# Patient Record
Sex: Female | Born: 1935 | Race: Black or African American | Hispanic: No | Marital: Married | Smoking: Former smoker
Health system: Southern US, Community
[De-identification: ages and names within clinical notes are randomized; demographics above are authoritative.]

## PROBLEM LIST (undated history)

## (undated) DIAGNOSIS — I890 Lymphedema, not elsewhere classified: Secondary | ICD-10-CM

## (undated) DIAGNOSIS — I10 Essential (primary) hypertension: Secondary | ICD-10-CM

## (undated) DIAGNOSIS — M1990 Unspecified osteoarthritis, unspecified site: Secondary | ICD-10-CM

## (undated) DIAGNOSIS — Z923 Personal history of irradiation: Secondary | ICD-10-CM

## (undated) DIAGNOSIS — E119 Type 2 diabetes mellitus without complications: Secondary | ICD-10-CM

## (undated) DIAGNOSIS — C801 Malignant (primary) neoplasm, unspecified: Secondary | ICD-10-CM

## (undated) DIAGNOSIS — E7800 Pure hypercholesterolemia, unspecified: Secondary | ICD-10-CM

## (undated) DIAGNOSIS — I2699 Other pulmonary embolism without acute cor pulmonale: Secondary | ICD-10-CM

## (undated) DIAGNOSIS — C779 Secondary and unspecified malignant neoplasm of lymph node, unspecified: Secondary | ICD-10-CM

## (undated) DIAGNOSIS — C50919 Malignant neoplasm of unspecified site of unspecified female breast: Secondary | ICD-10-CM

## (undated) HISTORY — PX: APPENDECTOMY: SHX54

## (undated) HISTORY — PX: ABDOMINAL HYSTERECTOMY: SHX81

## (undated) HISTORY — DX: Type 2 diabetes mellitus without complications: E11.9

## (undated) HISTORY — DX: Secondary and unspecified malignant neoplasm of lymph node, unspecified: C77.9

## (undated) HISTORY — PX: BREAST SURGERY: SHX581

## (undated) HISTORY — DX: Essential (primary) hypertension: I10

---

## 1993-04-14 HISTORY — PX: ANKLE SURGERY: SHX546

## 2006-05-20 ENCOUNTER — Ambulatory Visit (HOSPITAL_COMMUNITY): Admission: RE | Admit: 2006-05-20 | Discharge: 2006-05-20 | Payer: Self-pay | Admitting: Internal Medicine

## 2008-05-02 ENCOUNTER — Observation Stay (HOSPITAL_COMMUNITY): Admission: EM | Admit: 2008-05-02 | Discharge: 2008-05-03 | Payer: Self-pay | Admitting: Emergency Medicine

## 2009-09-21 ENCOUNTER — Encounter: Admission: RE | Admit: 2009-09-21 | Discharge: 2009-09-21 | Payer: Self-pay | Admitting: Internal Medicine

## 2010-04-20 ENCOUNTER — Emergency Department (HOSPITAL_COMMUNITY)
Admission: EM | Admit: 2010-04-20 | Discharge: 2010-04-20 | Payer: Self-pay | Source: Home / Self Care | Admitting: Emergency Medicine

## 2010-04-26 ENCOUNTER — Inpatient Hospital Stay (HOSPITAL_COMMUNITY)
Admission: RE | Admit: 2010-04-26 | Discharge: 2010-04-29 | Payer: Self-pay | Source: Home / Self Care | Attending: Surgery | Admitting: Surgery

## 2010-04-26 HISTORY — PX: HERNIA REPAIR: SHX51

## 2010-04-29 LAB — URINE MICROSCOPIC-ADD ON

## 2010-04-29 LAB — URINALYSIS, ROUTINE W REFLEX MICROSCOPIC
Bilirubin Urine: NEGATIVE
Bilirubin Urine: NEGATIVE
Hgb urine dipstick: NEGATIVE
Hgb urine dipstick: NEGATIVE
Ketones, ur: NEGATIVE mg/dL
Ketones, ur: NEGATIVE mg/dL
Nitrite: NEGATIVE
Nitrite: NEGATIVE
Protein, ur: NEGATIVE mg/dL
Protein, ur: NEGATIVE mg/dL
Specific Gravity, Urine: 1.021 (ref 1.005–1.030)
Specific Gravity, Urine: 1.017 (ref 1.005–1.030)
Urine Glucose, Fasting: NEGATIVE mg/dL
Urine Glucose, Fasting: NEGATIVE mg/dL
Urobilinogen, UA: 1 mg/dL (ref 0.0–1.0)
Urobilinogen, UA: 0.2 mg/dL (ref 0.0–1.0)
pH: 7.5 (ref 5.0–8.0)
pH: 7.5 (ref 5.0–8.0)

## 2010-04-29 LAB — GLUCOSE, CAPILLARY
Glucose-Capillary: 90 mg/dL (ref 70–99)
Glucose-Capillary: 135 mg/dL — ABNORMAL HIGH (ref 70–99)
Glucose-Capillary: 209 mg/dL — ABNORMAL HIGH (ref 70–99)
Glucose-Capillary: 197 mg/dL — ABNORMAL HIGH (ref 70–99)
Glucose-Capillary: 181 mg/dL — ABNORMAL HIGH (ref 70–99)
Glucose-Capillary: 178 mg/dL — ABNORMAL HIGH (ref 70–99)
Glucose-Capillary: 163 mg/dL — ABNORMAL HIGH (ref 70–99)
Glucose-Capillary: 151 mg/dL — ABNORMAL HIGH (ref 70–99)
Glucose-Capillary: 126 mg/dL — ABNORMAL HIGH (ref 70–99)
Glucose-Capillary: 116 mg/dL — ABNORMAL HIGH (ref 70–99)
Glucose-Capillary: 82 mg/dL (ref 70–99)
Glucose-Capillary: 210 mg/dL — ABNORMAL HIGH (ref 70–99)

## 2010-04-29 LAB — CBC
HCT: 41.8 % (ref 36.0–46.0)
HCT: 40.3 % (ref 36.0–46.0)
HCT: 37.1 % (ref 36.0–46.0)
Hemoglobin: 13.4 g/dL (ref 12.0–15.0)
Hemoglobin: 13.1 g/dL (ref 12.0–15.0)
Hemoglobin: 11.7 g/dL — ABNORMAL LOW (ref 12.0–15.0)
MCH: 24.8 pg — ABNORMAL LOW (ref 26.0–34.0)
MCH: 25.4 pg — ABNORMAL LOW (ref 26.0–34.0)
MCH: 24.7 pg — ABNORMAL LOW (ref 26.0–34.0)
MCHC: 32.1 g/dL (ref 30.0–36.0)
MCHC: 32.5 g/dL (ref 30.0–36.0)
MCHC: 31.5 g/dL (ref 30.0–36.0)
MCV: 77.4 fL — ABNORMAL LOW (ref 78.0–100.0)
MCV: 78.1 fL (ref 78.0–100.0)
MCV: 78.4 fL (ref 78.0–100.0)
Platelets: 210 10*3/uL (ref 150–400)
Platelets: 199 10*3/uL (ref 150–400)
Platelets: 119 10*3/uL — ABNORMAL LOW (ref 150–400)
RBC: 5.4 MIL/uL — ABNORMAL HIGH (ref 3.87–5.11)
RBC: 5.16 MIL/uL — ABNORMAL HIGH (ref 3.87–5.11)
RBC: 4.73 MIL/uL (ref 3.87–5.11)
RDW: 15.5 % (ref 11.5–15.5)
RDW: 15.2 % (ref 11.5–15.5)
RDW: 15.7 % — ABNORMAL HIGH (ref 11.5–15.5)
WBC: 8.3 10*3/uL (ref 4.0–10.5)
WBC: 6.7 10*3/uL (ref 4.0–10.5)
WBC: 8.7 10*3/uL (ref 4.0–10.5)

## 2010-04-29 LAB — BASIC METABOLIC PANEL
BUN: 10 mg/dL (ref 6–23)
BUN: 12 mg/dL (ref 6–23)
CO2: 28 mEq/L (ref 19–32)
CO2: 23 mEq/L (ref 19–32)
Calcium: 9.6 mg/dL (ref 8.4–10.5)
Calcium: 8.3 mg/dL — ABNORMAL LOW (ref 8.4–10.5)
Chloride: 101 mEq/L (ref 96–112)
Chloride: 103 mEq/L (ref 96–112)
Creatinine, Ser: 0.81 mg/dL (ref 0.4–1.2)
Creatinine, Ser: 0.67 mg/dL (ref 0.4–1.2)
GFR calc Af Amer: >60 mL/min (ref >60)
GFR calc Af Amer: >60 mL/min (ref >60)
GFR calc non Af Amer: >60 mL/min (ref >60)
GFR calc non Af Amer: >60 mL/min (ref >60)
Glucose, Bld: 176 mg/dL — ABNORMAL HIGH (ref 70–99)
Glucose, Bld: 188 mg/dL — ABNORMAL HIGH (ref 70–99)
Potassium: 4.8 mEq/L (ref 3.5–5.1)
Potassium: 5.2 mEq/L — ABNORMAL HIGH (ref 3.5–5.1)
Sodium: 139 mEq/L (ref 135–145)
Sodium: 135 mEq/L (ref 135–145)

## 2010-04-29 LAB — COMPREHENSIVE METABOLIC PANEL
ALT: 18 U/L (ref 0–35)
AST: 21 U/L (ref 0–37)
Albumin: 4.4 g/dL (ref 3.5–5.2)
Alkaline Phosphatase: 86 U/L (ref 39–117)
BUN: 12 mg/dL (ref 6–23)
CO2: 27 mEq/L (ref 19–32)
Calcium: 9.6 mg/dL (ref 8.4–10.5)
Chloride: 101 mEq/L (ref 96–112)
Creatinine, Ser: 0.61 mg/dL (ref 0.4–1.2)
GFR calc Af Amer: >60 mL/min (ref >60)
GFR calc non Af Amer: >60 mL/min (ref >60)
Glucose, Bld: 135 mg/dL — ABNORMAL HIGH (ref 70–99)
Potassium: 3.6 mEq/L (ref 3.5–5.1)
Sodium: 139 mEq/L (ref 135–145)
Total Bilirubin: 0.5 mg/dL (ref 0.3–1.2)
Total Protein: 7.8 g/dL (ref 6.0–8.3)

## 2010-04-29 LAB — DIFFERENTIAL
Basophils Absolute: 0 10*3/uL (ref 0.0–0.1)
Basophils Relative: 0 % (ref 0–1)
Eosinophils Absolute: 0.1 10*3/uL (ref 0.0–0.7)
Eosinophils Relative: 1 % (ref 0–5)
Lymphocytes Relative: 20 % (ref 12–46)
Lymphs Abs: 1.7 10*3/uL (ref 0.7–4.0)
Monocytes Absolute: 0.3 10*3/uL (ref 0.1–1.0)
Monocytes Relative: 3 % (ref 3–12)
Neutro Abs: 6.3 10*3/uL (ref 1.7–7.7)
Neutrophils Relative %: 76 % (ref 43–77)

## 2010-04-29 LAB — SURGICAL PCR SCREEN
MRSA, PCR: NEGATIVE
Staphylococcus aureus: NEGATIVE

## 2010-04-29 LAB — LIPASE, BLOOD: Lipase: 17 U/L (ref 11–59)

## 2010-04-29 LAB — LACTIC ACID, PLASMA: Lactic Acid, Venous: 1.4 mmol/L (ref 0.5–2.2)

## 2010-05-01 LAB — GLUCOSE, CAPILLARY
Glucose-Capillary: 95 mg/dL (ref 70–99)
Glucose-Capillary: 176 mg/dL — ABNORMAL HIGH (ref 70–99)

## 2010-05-29 NOTE — Discharge Summary (Signed)
  NAMELATUNYA, KISSICK               ACCOUNT NO.:  [id number]  MEDICAL RECORD NO.:  [id number]          PATIENT TYPE:  INP  LOCATION:  5159                         FACILITY:  [facility]  PHYSICIAN:  [name], M.D. DATE OF BIRTH:  Dec 29, 1932  DATE OF ADMISSION:  04/26/2010 DATE OF DISCHARGE:  04/29/2010                              DISCHARGE SUMMARY   DISCHARGE DIAGNOSIS:  Ventral incisional hernia.  She is status post laparoscopic converted open ventral incisional hernia repair with mesh.  OTHER DIAGNOSES: 1. Hypertension. 2. Diabetes. 3. History of breast cancer.  SUMMARY OF HISTORY:  This is a 77 year old female who presents with a symptomatic ventral incisional hernia.  She now presents for elective repair with mesh.  HOSPITAL COURSE:  The patient was admitted and taken to the operating room where she underwent a laparoscopic converted to open ventral incisional repair with mesh.  Postoperatively, she was taken to the regular surgical floor with a drain in place.  On postoperative day #1, she was doing well and her IV was hep-locked.  Her diabetes was controlled with metformin and sliding scale insulin.  On postoperative day #2, she continued to improve.  She was started on diet and she began ambulating.  On postoperative day #3, she was doing quite well.  She was still constipating and we gave her Dulcolax suppository and she had a bowel movement later that day.  Her pain was well controlled on oral pain medication.  Decision was made to discharge the patient home.  DISCHARGE DIET:  Diabetic.  DISCHARGE ACTIVITY:  She is to do no heavy lifting greater 20 pounds for 6 weeks.  She is instructed on drain care.  She may shower.  DISCHARGE FOLLOWUP:  She will follow up with [facility] in 1 week.     [name], M.D.     DB/MEDQ  D:  05/16/2010  T:  05/17/2010  Job:  [id number]  Electronically Signed by [name] M.D. on 05/29/2010 78:46:96  PM

## 2010-07-29 LAB — CBC
HCT: 37.2 % (ref 36.0–46.0)
Hemoglobin: 12.4 g/dL (ref 12.0–15.0)
MCHC: 33.2 g/dL (ref 30.0–36.0)
MCV: 76.3 fL — ABNORMAL LOW (ref 78.0–100.0)
Platelets: 208 10*3/uL (ref 150–400)
RBC: 4.88 MIL/uL (ref 3.87–5.11)
RDW: 15.4 % (ref 11.5–15.5)
WBC: 10.4 10*3/uL (ref 4.0–10.5)

## 2010-07-29 LAB — RENAL FUNCTION PANEL
Albumin: 3.5 g/dL (ref 3.5–5.2)
BUN: 15 mg/dL (ref 6–23)
CO2: 25 mEq/L (ref 19–32)
Calcium: 9.2 mg/dL (ref 8.4–10.5)
Chloride: 103 mEq/L (ref 96–112)
Creatinine, Ser: 0.7 mg/dL (ref 0.4–1.2)
GFR calc Af Amer: >60 mL/min (ref >60)
GFR calc non Af Amer: >60 mL/min (ref >60)
Glucose, Bld: 221 mg/dL — ABNORMAL HIGH (ref 70–99)
Phosphorus: 3.3 mg/dL (ref 2.3–4.6)
Potassium: 3.8 mEq/L (ref 3.5–5.1)
Sodium: 139 mEq/L (ref 135–145)

## 2010-07-29 LAB — DIFFERENTIAL
Basophils Absolute: 0 10*3/uL (ref 0.0–0.1)
Basophils Relative: 0 % (ref 0–1)
Eosinophils Absolute: 0 10*3/uL (ref 0.0–0.7)
Eosinophils Relative: 0 % (ref 0–5)
Lymphocytes Relative: 17 % (ref 12–46)
Lymphs Abs: 1.7 10*3/uL (ref 0.7–4.0)
Monocytes Absolute: 0.1 10*3/uL (ref 0.1–1.0)
Monocytes Relative: 1 % — ABNORMAL LOW (ref 3–12)
Neutro Abs: 8.6 10*3/uL — ABNORMAL HIGH (ref 1.7–7.7)
Neutrophils Relative %: 83 % — ABNORMAL HIGH (ref 43–77)

## 2010-07-29 LAB — BASIC METABOLIC PANEL
BUN: 18 mg/dL (ref 6–23)
CO2: 26 mEq/L (ref 19–32)
Calcium: 8.8 mg/dL (ref 8.4–10.5)
Chloride: 104 mEq/L (ref 96–112)
Creatinine, Ser: 0.59 mg/dL (ref 0.4–1.2)
GFR calc Af Amer: >60 mL/min (ref >60)
GFR calc non Af Amer: >60 mL/min (ref >60)
Glucose, Bld: 216 mg/dL — ABNORMAL HIGH (ref 70–99)
Potassium: 3.4 mEq/L — ABNORMAL LOW (ref 3.5–5.1)
Sodium: 139 mEq/L (ref 135–145)

## 2010-07-29 LAB — GLUCOSE, CAPILLARY
Glucose-Capillary: 205 mg/dL — ABNORMAL HIGH (ref 70–99)
Glucose-Capillary: 209 mg/dL — ABNORMAL HIGH (ref 70–99)
Glucose-Capillary: 214 mg/dL — ABNORMAL HIGH (ref 70–99)
Glucose-Capillary: 216 mg/dL — ABNORMAL HIGH (ref 70–99)
Glucose-Capillary: 299 mg/dL — ABNORMAL HIGH (ref 70–99)
Glucose-Capillary: 299 mg/dL — ABNORMAL HIGH (ref 70–99)

## 2010-07-29 LAB — HEMOGLOBIN A1C
Hgb A1c MFr Bld: 7.6 % — ABNORMAL HIGH (ref 4.6–6.1)
Mean Plasma Glucose: 171 mg/dL

## 2010-08-27 NOTE — Discharge Summary (Signed)
Tami Schmidt, CREAN NO.:  [id number]   MEDICAL RECORD NO.:  [id number]          PATIENT TYPE:  OBV   LOCATION:  3736                         FACILITY:  [facility]   PHYSICIAN:  Eduard Clos, MDDATE OF BIRTH:  1933/01/03   DATE OF ADMISSION:  05/01/2008  DATE OF DISCHARGE:                               DISCHARGE SUMMARY   HOSPITAL COURSE:  This is a 77 year old female with history of  hypertension, hyperlipidemia, diabetes mellitus type 2 was recently  started on lisinopril . The patient had taken the medicine for a week  after being replaced for Hyzaar.  She started developing a swelling of  the face and tongue with difficulty breathing and was admitted to the  hospital for angioedema secondary to ACE inhibitors.  The patient was  placed on steroids IV and eventually changed to p.o. prednisone.  Symptoms are completely resolved now, able to tolerate diet and is eager  to go home.  The patient told not to take ACE inhibitors again, for  which she has agreed.  The patient will be discharged home on Hyzaar.  She had tolerated that for many years, with a prednisone taper dose.  At  the time of discharge the patient is hemodynamically stable.   FINAL DIAGNOSIS:  1. Angioedema secondary to ACE inhibitors.  2. Diabetes mellitus type 2.  3. Hypertension.  4. Hyperlipidemia.   DISCHARGE MEDICATIONS:  1. Humulin-N 45 units subcutaneously in A.M. and 20 units      subcutaneously in the P.M.  2. Metformin 500 mg p.o. b.i.d.  3. Amlodipine 10 mg p.o. daily.  4. Metoprolol 25 mg p.o. daily.  5. Lovastatin 80 mg p.o. daily.  6. Calcium one tablet p.o. daily.  7. Fish oil, two tablets p.o. daily.  8. Chondroitin two tablets p.o. daily.  9. Azopt drops 1 drop each eye twice daily.  10.Hyzaar 20/25 mg p.o. daily.  11.Prednisone taper over 6 days.   PLAN:  Patient advised to follow up with Dr. Kellie Shropshire within two days  and recheck a basic metabolic panel.  To be  on a cardiac-healthy,  carbohydrate modified diet, to check her blood sugars q.a.c. and h.s.  and to be cautious of hypoglycemia and not to take ACE inhibitors again.  In the event of any recurrence of symptoms, she should go to the nearest  ER.      Eduard Clos, MD  Electronically Signed     ANK/MEDQ  D:  05/03/2008  T:  05/03/2008  Job:  [id number]   cc:   Merlene Laughter. Renae Gloss, M.D.

## 2010-08-27 NOTE — H&P (Signed)
NAMEREANNA, SCOGGIN NO.:  [id number]   MEDICAL RECORD NO.:  [id number]          PATIENT TYPE:  EMS   LOCATION:  MAJO                         FACILITY:  [facility]   PHYSICIAN:  Lonia Blood, M.D.DATE OF BIRTH:  07-Jul-1932   DATE OF ADMISSION:  05/01/2008  DATE OF DISCHARGE:                              HISTORY & PHYSICAL   PRIMARY CARE PHYSICIAN:  Merlene Laughter. Renae Gloss, M.D.   CHIEF COMPLAINT:  Angioedema.   HISTORY OF PRESENT ILLNESS:  Ms. Che Below is a very pleasant 77-  year-old female who was in her usual state of health until this morning  when she awoke to find that the left side of her face and specifically  her mouth was severely swollen.  As she watched herself throughout the  morning, her symptoms progressed to move from the corner of the mouth to  the midline.  She then took some Benadryl.  Despite this, the swelling  then proceeded to progress to cross the midline into the right corner of  the mouth.  Patient's lips became so swollen that she could barely move  her mouth and barely open her mouth to speak.  She did not experience  wheeze or stridor.  She came to the emergency room after she became  concerned that her swelling was worsening.  The patient states that she  has been on an angiotensin receptor blocker for many, many years.  She  has tolerated this without difficulty.  For financial concerns, she  recently asked if Dr. Renae Gloss could put her on a cheaper medication.  Lisinopril was therefore begun approximately 1 week ago.  Patient has  had no recent bee stings, no change in her diet intake, no change in her  lotion or detergent use at home.  She has had no similar reactions in  the past.   REVIEW OF SYSTEMS:  Comprehensive review of systems  is unremarkable  with the exception to the positive elements noted in the history of  present illness above.  Patient states that she has been perfectly  healthy lately and has had no  significant difficulties until this  morning.   PAST MEDICAL HISTORY:  1. Diabetes mellitus type 2.  2. Hypertension.  3. Hypercholesterolemia.  4. Status post bilateral mastectomy secondary to breast cancer with      radiation therapy following.  5. Total abdominal hysterectomy/bilateral salpingo-oophorectomy.   OUTPATIENT MEDICATIONS:  Doses unclear:  1. Lisinopril.  2. Amlodipine.  3. Metformin 500 mg p.o. b.i.d.  4. Metoprolol.  5. Humalog 45 units in the morning, 20 units in the p.m.  6. Lovastatin 80 mg daily.   ALLERGIES:  No known drug allergies.   FAMILY HISTORY:  Noncontributory this admission.   SOCIAL HISTORY:  Patient lives in [place] with her husband.  She is  married.  She is a retired Child psychotherapist.  She does not smoke.  She does  not drink more than 1-2 glasses of wine per week.   DATA REVIEW:  CBG is 299.  No other laboratory values are available.   PHYSICAL EXAMINATION:  Temperature  97.4, blood pressure 151/80, heart  rate 73, respiratory rate 18, oxygen saturation 96% on room air.  GENERAL:  A well-developed and well-nourished female in no acute  respiratory distress.  HEENT:  Classic facies of angioedema with marked significant swelling of  both lips.  The patient is able to open her mouth.  There is no apparent  swelling at the tonsillar pillars or the oropharynx.  Patient's tongue  appears to be a normal size at the present time.  NECK:  No JVD.  LUNGS:  Clear to auscultation bilaterally with no wheezes, rhonchi, or  stridor.  CARDIOVASCULAR:  Regular rate and rhythm without murmur, rub or gallop.  Normal S1 and S2.  ABDOMEN:  Nontender, nondistended.  Soft.  Bowel sounds present.  No  hepatosplenomegaly.  No rebound, no ascites.  EXTREMITIES:  No clubbing, cyanosis or edema in bilateral lower  extremities.   IMPRESSION/PLAN:  1. Classic ACE inhibitor-associated angioedema:  I have counseled the      patient extensively as to the connection  between her ACE inhibitor      use and her angioedema.  I have advised her that she will need to      avoid ACE inhibitors from this point forward in her life.  I have      advised her of the very likely possibility that a future reaction      would be even more severe than this and could result in severe      edema of the airway and result in death if not accompanied by      immediate emergency medical assistance.  We will dose the patient      with IV Solu-Medrol, H1 and H2 blockers during her hospital stay.      We will monitor her with frequent vital signs.  At the present      time, however, there is no evidence of stridor to suggest that the      patient would need ICU monitoring.  If the patient's symptoms      improve rapidly, she may be a candidate for discharge within 24      hours.  I am asking that a carbon copy also be sent to Dr. Renae Gloss,      the patient's primary care physician, so that she is aware of Ms.      Lodge's newly acquired intolerance to ACE inhibitors.  2. Diabetes mellitus:  We will anticipate the patient's CBG will      become quite difficult to control on her high-dose Solu-Medrol.  At      such time, if the patient proves to be improving, we will rapidly      taper her steroids to off.  In the meantime, we will cover her for      sliding-scale insulin and follow her CBG closely.  3. Hypertension:  We will continue the patient's Amlodipine and      metoprolol.  It is quite possible these will need to be titrated      upward in the absence of ACE inhibitor.  We will follow the      patient's blood pressure trend.  4. Hypercholesterolemia:  We will continue Lovastatin with no change.      Lonia Blood, M.D.  Electronically Signed     JTM/MEDQ  D:  05/02/2008  T:  05/02/2008  Job:  [id number]   cc:   Merlene Laughter. Renae Gloss, M.D.

## 2011-06-23 ENCOUNTER — Encounter (HOSPITAL_COMMUNITY): Payer: Self-pay

## 2011-06-23 ENCOUNTER — Emergency Department (HOSPITAL_COMMUNITY)
Admission: EM | Admit: 2011-06-23 | Discharge: 2011-06-23 | Disposition: A | Discharge status: Home Health | Payer: Medicare Other | Source: Home / Self Care | Attending: Emergency Medicine | Admitting: Emergency Medicine

## 2011-06-23 DIAGNOSIS — J069 Acute upper respiratory infection, unspecified: Secondary | ICD-10-CM

## 2011-06-23 DIAGNOSIS — J029 Acute pharyngitis, unspecified: Secondary | ICD-10-CM

## 2011-06-23 HISTORY — DX: Essential (primary) hypertension: I10

## 2011-06-23 LAB — POCT RAPID STREP A: Streptococcus, Group A Screen (Direct): NEGATIVE

## 2011-06-23 MED ORDER — BENZONATATE 200 MG PO CAPS: 200.0000 mg | ORAL_CAPSULE | Freq: Three times a day (TID) | ORAL | Status: AC | PRN

## 2011-06-23 MED ORDER — AMOXICILLIN 500 MG PO CAPS: 1000.0000 mg | ORAL_CAPSULE | Freq: Three times a day (TID) | ORAL | Status: DC

## 2011-06-23 MED ORDER — AMOXICILLIN 500 MG PO CAPS: 1000.0000 mg | ORAL_CAPSULE | Freq: Three times a day (TID) | ORAL | Status: AC

## 2011-06-23 MED ORDER — BENZONATATE 200 MG PO CAPS: 200.0000 mg | ORAL_CAPSULE | Freq: Three times a day (TID) | ORAL | Status: DC | PRN

## 2011-06-23 NOTE — ED Provider Notes (Signed)
Chief Complaint  Patient presents with  . URI  . Sore Throat    History of Present Illness:   Tami Schmidt is a 78 year old female who has had a ten-day history of severe sore throat, nasal congestion with clear rhinorrhea, headache, cough productive of small amounts of clear to yellow sputum, and sweats. She denies any fever, chills, chest pain, shortness of breath, or GI symptoms.  Review of Systems:  Other than noted above, the patient denies any of the following symptoms. Systemic:  No fever, chills, sweats, fatigue, myalgias, headache, or anorexia. Eye:  No redness, pain or drainage. ENT:  No earache, nasal congestion, rhinorrhea, sinus pressure, or sore throat. Lungs:  No cough, sputum production, wheezing, shortness of breath. Or chest pain. GI:  No nausea, vomiting, abdominal pain or diarrhea. Skin:  No rash or itching.  PMFSH:  Past medical history, family history, social history, meds, and allergies were reviewed.  Physical Exam:   Vital signs:  BP 149/76  Pulse 86  Temp(Src) 99.3 F (37.4 C) (Oral)  Resp 20  SpO2 98% General:  Alert, in no distress. Eye:  No conjunctival injection or drainage. ENT:  TMs and canals were normal, without erythema or inflammation.  Nasal mucosa was clear and uncongested, without drainage.  Mucous membranes were moist.  Pharynx was very red, without exudate or drainage.  There were no oral ulcerations or lesions. Neck:  Supple, no adenopathy, tenderness or mass. Lungs:  No respiratory distress.  Lungs were clear to auscultation, without wheezes, rales or rhonchi.  Breath sounds were clear and equal bilaterally. Heart:  Regular rhythm, without gallops, murmers or rubs. Skin:  Clear, warm, and dry, without rash or lesions.  Labs:   Results for orders placed during the hospital encounter of 06/23/11  POCT RAPID STREP A ([facility]ARE ONLY)      Component Value Range   Streptococcus, Group A Screen (Direct) NEGATIVE  NEGATIVE     Assessment:     Diagnoses that have been ruled out:  None  Diagnoses that are still under consideration:  None  Final diagnoses:  Pharyngitis  Upper respiratory infection      Plan:   1.  The following meds were prescribed:   New Prescriptions   AMOXICILLIN (AMOXIL) 500 MG CAPSULE    Take 2 capsules (1,000 mg total) by mouth 3 (three) times daily.   BENZONATATE (TESSALON) 200 MG CAPSULE    Take 1 capsule (200 mg total) by mouth 3 (three) times daily as needed for cough.   2.  The patient was instructed in symptomatic care and handouts were given. 3.  The patient was told to return if becoming worse in any way, if no better in 3 or 4 days, and given some red flag symptoms that would indicate earlier return.   Reuben Likes, MD 06/23/11 [phone]

## 2011-06-23 NOTE — Discharge Instructions (Signed)

## 2011-06-23 NOTE — ED Notes (Signed)
C/o sore throat, nasal congestion, prod. Cough of clear sputum for 10 days.  Denies fever.

## 2012-05-27 ENCOUNTER — Ambulatory Visit (INDEPENDENT_AMBULATORY_CARE_PROVIDER_SITE_OTHER): Payer: Self-pay | Admitting: Surgery

## 2012-06-14 ENCOUNTER — Ambulatory Visit (INDEPENDENT_AMBULATORY_CARE_PROVIDER_SITE_OTHER): Payer: Medicare Other | Admitting: Surgery

## 2012-06-14 ENCOUNTER — Other Ambulatory Visit (INDEPENDENT_AMBULATORY_CARE_PROVIDER_SITE_OTHER): Payer: Self-pay | Admitting: Surgery

## 2012-06-14 ENCOUNTER — Encounter (INDEPENDENT_AMBULATORY_CARE_PROVIDER_SITE_OTHER): Payer: Self-pay | Admitting: Surgery

## 2012-06-14 VITALS — BP 130/70 | HR 96 | Temp 96.6°F | Resp 18 | Ht 67.0 in | Wt 238.8 lb

## 2012-06-14 DIAGNOSIS — R188 Other ascites: Secondary | ICD-10-CM

## 2012-06-14 DIAGNOSIS — K4090 Unilateral inguinal hernia, without obstruction or gangrene, not specified as recurrent: Secondary | ICD-10-CM

## 2012-06-14 DIAGNOSIS — K439 Ventral hernia without obstruction or gangrene: Secondary | ICD-10-CM

## 2012-06-14 DIAGNOSIS — R140 Abdominal distension (gaseous): Secondary | ICD-10-CM

## 2012-06-14 DIAGNOSIS — R142 Eructation: Secondary | ICD-10-CM

## 2012-06-14 LAB — BUN: BUN: 16 mg/dL (ref 6–23)

## 2012-06-14 NOTE — Progress Notes (Signed)
Subjective:     Patient ID: Tami Schmidt, female   DOB: [birth year], 79 y.o.   MRN: [id number]  HPI This is a very pleasant female I performed a laparoscopic converted to open incisional hernia repair with mesh back in 2012. She came today because of increasing abdominal distention. She reports it is mostly in her upper abdomen and feels like fluid. She has no nausea, vomiting, or abdominal pain. Bowel movements are normal  Review of Systems     Objective:   Physical Exam On exam, her abdomen is morbidly obese. There is a question of a fluid wave. Her incision is well-healed. I do not feel a recurrent hernia although my exam is limited by her obesity    Assessment:     Abdominal distention of uncertain etiology     Plan:     I want to get a CAT scan of her abdomen and pelvis to evaluate his distention and possible ascites as well as to determine whether there is a recurrent hernia present or a seroma. I will see her back once the CAT scan is performed.

## 2012-06-16 ENCOUNTER — Ambulatory Visit
Admission: RE | Admit: 2012-06-16 | Discharge: 2012-06-16 | Disposition: A | Discharge status: Home / Self Care | Payer: Medicare Other | Source: Ambulatory Visit | Attending: Surgery | Admitting: Surgery

## 2012-06-16 MED ORDER — IOHEXOL 300 MG/ML  SOLN
125.0000 mL | Freq: Once | INTRAMUSCULAR | Status: AC | PRN
  Administered 2012-06-16: 125 mL via INTRAVENOUS

## 2012-06-23 ENCOUNTER — Ambulatory Visit (INDEPENDENT_AMBULATORY_CARE_PROVIDER_SITE_OTHER): Payer: Medicare Other | Admitting: Surgery

## 2012-06-23 VITALS — BP 120/64 | HR 78 | Temp 97.3°F | Resp 20 | Ht 67.0 in | Wt 239.0 lb

## 2012-06-23 DIAGNOSIS — R140 Abdominal distension (gaseous): Secondary | ICD-10-CM

## 2012-06-23 DIAGNOSIS — R143 Flatulence: Secondary | ICD-10-CM

## 2012-06-23 NOTE — Progress Notes (Signed)
Subjective:     Patient ID: Tami Schmidt, female   DOB: Oct 24, 1932, 79 y.o.   MRN: [id number]  HPI She is here for follow up with a CAT scan of her abdomen and pelvis. This again was done for abdominal distention. She has no pain today and is having normal bowel movements  Review of Systems     Objective:   Physical Exam On exam, she has a morbidly obese, rotund abdomen  The CAT scan of the abdomen and pelvis was normal with no evidence of intra-abdominal pathology or hernia    Assessment:     Obesity     Plan:     I've encouraged to exercise. Again, I explained the results of the CAT scan to her. I will see her back as needed

## 2013-01-14 ENCOUNTER — Ambulatory Visit (INDEPENDENT_AMBULATORY_CARE_PROVIDER_SITE_OTHER): Payer: Medicare Other | Admitting: Surgery

## 2013-01-14 ENCOUNTER — Encounter (INDEPENDENT_AMBULATORY_CARE_PROVIDER_SITE_OTHER): Payer: Self-pay | Admitting: Surgery

## 2013-01-14 VITALS — BP 128/77 | HR 70 | Temp 97.6°F | Resp 14 | Ht 67.0 in | Wt 238.8 lb

## 2013-01-14 DIAGNOSIS — R1900 Intra-abdominal and pelvic swelling, mass and lump, unspecified site: Secondary | ICD-10-CM

## 2013-01-14 DIAGNOSIS — R222 Localized swelling, mass and lump, trunk: Secondary | ICD-10-CM | POA: Insufficient documentation

## 2013-01-14 NOTE — Progress Notes (Signed)
Patient ID: Tami Schmidt, female   DOB: November 13, 1932, 79 y.o.   MRN: [id number]  Chief Complaint  Patient presents with  . Other    left chest wall lump    HPI Tami Schmidt is a 79 y.o. female.   HPI This is a 79 year old female who I have operated on in the past for a ventral incisional hernia. She is in complaining about a mass on her chest wall. She has had previous bilateral mastectomies done in [place]. I believe the left was prophylactic. She had radiation therapy to the right side. She has had a large soft tissue mass on the left chest as well as on the left abdominal wall. She noticed an area on the medial chest and left side  That began to dimple approximately 3 months ago. She is otherwise without complaints.  The large chest wall mass and abdominal mass had been there for years now becoming larger and causing increased discomfort Past Medical History  Diagnosis Date  . Diabetes mellitus   . Hypertension     Past Surgical History  Procedure Laterality Date  . Abdominal hysterectomy    . Hernia repair  04-26-2010    History reviewed. No pertinent family history.  Social History History  Substance Use Topics  . Smoking status: Former Smoker    Types: Cigarettes    Quit date: 04/14/1972  . Smokeless tobacco: Never Used  . Alcohol Use: No    Allergies  Allergen Reactions  . Bactrim   . Lisinopril   . Vasotec     Current Outpatient Prescriptions  Medication Sig Dispense Refill  . amLODipine (NORVASC) 10 MG tablet Take 10 mg by mouth daily.      Marland Kitchen BIOTIN PO Take by mouth daily.      . Chlorphen-Pyril-Phenyleph (TRIPLEX AD PO) Take by mouth daily.      . Choline Fenofibrate (FENOFIBRIC ACID) 135 MG CPDR       . fish oil-omega-3 fatty acids 1000 MG capsule Take 2 g by mouth daily.      . Folic Acid-Vit B6-Vit B12 0.5-5-0.2 MG TABS Take by mouth daily.      Marland Kitchen HYDROcodone-acetaminophen (NORCO/VICODIN) 5-325 MG per tablet       . Insulin Isophane Human (HUMULIN N  [place]) Inject 35 Units into the skin every morning. 20 units in pm      . losartan-hydrochlorothiazide (HYZAAR) 100-25 MG per tablet Take by mouth daily.       . metFORMIN (GLUCOPHAGE) 500 MG tablet Take 500 mg by mouth daily with breakfast.       No current facility-administered medications for this visit.    Review of Systems Review of Systems  Constitutional: Negative for fever, chills and unexpected weight change.  HENT: Negative for hearing loss, congestion, sore throat, trouble swallowing and voice change.   Eyes: Negative for visual disturbance.  Respiratory: Negative for cough and wheezing.   Cardiovascular: Negative for chest pain, palpitations and leg swelling.  Gastrointestinal: Negative for nausea, vomiting, abdominal pain, diarrhea, constipation, blood in stool, abdominal distention and anal bleeding.  Genitourinary: Negative for hematuria, vaginal bleeding and difficulty urinating.  Musculoskeletal: Negative for arthralgias.  Skin: Negative for rash and wound.  Neurological: Negative for seizures, syncope and headaches.  Hematological: Negative for adenopathy. Does not bruise/bleed easily.  Psychiatric/Behavioral: Negative for confusion.    Blood pressure 128/77, pulse 70, temperature 97.6 F (36.4 C), temperature source Temporal, resp. rate 14, height 5\' 7"  (1.702 m), weight 238  lb 12.8 oz (108.319 kg).  Physical Exam Physical Exam  Constitutional: She is oriented to person, place, and time. No distress.  Morbidly obese  HENT:  Head: Normocephalic and atraumatic.  Right Ear: External ear normal.  Left Ear: External ear normal.  Nose: Nose normal.  Mouth/Throat: Oropharynx is clear and moist. No oropharyngeal exudate.  Eyes: Conjunctivae are normal. Pupils are equal, round, and reactive to light.  Neck: Normal range of motion. Neck supple. No tracheal deviation present.  Cardiovascular: Normal rate, regular rhythm and intact distal pulses.   Murmur  heard. Pulmonary/Chest: Effort normal and breath sounds normal. No respiratory distress. She has no wheezes.  She has a well-healed mastectomy incision bilaterally. There is a firm 1 cm area which is dimpled on the medial left chest above the mastectomy incision. She also has a large 6 cm mass on the left chest at the clavicle which is soft and spongy and consistent with a lipoma  Abdominal: Soft. Bowel sounds are normal.  There is an 8 cm soft mass on her left mid abdominal wall consistent with a lipoma  Musculoskeletal: Normal range of motion. She exhibits no edema and no tenderness.  Lymphadenopathy:    She has no cervical adenopathy.  Neurological: She is alert and oriented to person, place, and time.  Skin: Skin is warm and dry. She is not diaphoretic. No erythema.  Psychiatric: Her behavior is normal. Judgment normal.    Data Reviewed   Assessment    Chest wall mass x2 and abdominal wall mass of uncertain etiology     Plan    I am worried that the smaller chest wall mass could represent a breast cancer. I believe is very need be excised. The larger mass in the chest wall and abdominal wall are probably lipomas but they're becoming symptomatic so removal is also recommended. I discussed this with her in detail and she is either to proceed with surgery. I discussed the risks which includes but is not limited to bleeding, infection, need for further surgery should malignancy be present, et Karie Soda. She understands and wishes to proceed.        Arienne Gartin A 01/14/2013, 3:29 PM

## 2013-01-18 ENCOUNTER — Ambulatory Visit (INDEPENDENT_AMBULATORY_CARE_PROVIDER_SITE_OTHER): Payer: Medicare Other | Admitting: Surgery

## 2013-01-20 ENCOUNTER — Other Ambulatory Visit (HOSPITAL_COMMUNITY): Payer: Self-pay | Admitting: *Deleted

## 2013-01-20 NOTE — Pre-Procedure Instructions (Signed)
Artavia Jeanlouis Eutsler  01/20/2013   Your procedure is scheduled on:  Wednesday, January 26, 2013 at 1:40 PM.   Report to [facility] Entrance "A" at 10:40 AM.   Call this number if you have problems the morning of surgery: [phone]   Remember:   Do not eat food or drink liquids after midnight Tuesday, 01/25/13.   Take these medicines the morning of surgery with A SIP OF WATER: amLODipine (NORVASC)  Stop all Vitamins, Herbal Medications, and Fish Oil as of today 01/21/13.    Do not wear jewelry, make-up or nail polish.  Do not wear lotions, powders, or perfumes. You may wear deodorant.  Do not shave 48 hours prior to surgery.   Do not bring valuables to the hospital.  [facility] is not responsible                  for any belongings or valuables.               Contacts, dentures or bridgework may not be worn into surgery.  Leave suitcase in the car. After surgery it may be brought to your room.  For patients admitted to the hospital, discharge time is determined by your                treatment team.               Patients discharged the day of surgery will not be allowed to drive  home.  Name and phone number of your driver: Family/friend   Special Instructions: Shower using CHG 2 nights before surgery and the night before surgery.  If you shower the day of surgery use CHG.  Use special wash - you have one bottle of CHG for all showers.  You should use approximately 1/3 of the bottle for each shower.   Please read over the following fact sheets that you were given: Pain Booklet, Coughing and Deep Breathing and Surgical Site Infection Prevention

## 2013-01-21 ENCOUNTER — Encounter (HOSPITAL_COMMUNITY)
Admission: RE | Admit: 2013-01-21 | Discharge: 2013-01-21 | Disposition: A | Discharge status: Home / Self Care | Payer: Medicare Other | Source: Ambulatory Visit | Attending: Surgery | Admitting: Surgery

## 2013-01-21 ENCOUNTER — Encounter (HOSPITAL_COMMUNITY): Payer: Self-pay

## 2013-01-21 DIAGNOSIS — Z01810 Encounter for preprocedural cardiovascular examination: Secondary | ICD-10-CM | POA: Insufficient documentation

## 2013-01-21 DIAGNOSIS — Z01818 Encounter for other preprocedural examination: Secondary | ICD-10-CM | POA: Insufficient documentation

## 2013-01-21 DIAGNOSIS — Z01812 Encounter for preprocedural laboratory examination: Secondary | ICD-10-CM | POA: Insufficient documentation

## 2013-01-21 HISTORY — DX: Unspecified osteoarthritis, unspecified site: M19.90

## 2013-01-21 HISTORY — DX: Malignant (primary) neoplasm, unspecified: C80.1

## 2013-01-21 LAB — BASIC METABOLIC PANEL
BUN: 16 mg/dL (ref 6–23)
Chloride: 101 mEq/L (ref 96–112)
GFR calc Af Amer: >90 mL/min (ref >90)
GFR calc non Af Amer: 78 mL/min — ABNORMAL LOW (ref >90)
Potassium: 3.6 mEq/L (ref 3.5–5.1)
Sodium: 139 mEq/L (ref 135–145)

## 2013-01-21 LAB — CBC
Hemoglobin: 12.2 g/dL (ref 12.0–15.0)
MCH: 24.8 pg — ABNORMAL LOW (ref 26.0–34.0)
MCHC: 31.9 g/dL (ref 30.0–36.0)
Platelets: 230 10*3/uL (ref 150–400)
RBC: 4.92 MIL/uL (ref 3.87–5.11)
WBC: 8 10*3/uL (ref 4.0–10.5)

## 2013-01-21 NOTE — Progress Notes (Signed)
Primary physician - dr. Lovell Sheehan Does not have a cardiologist Dr. Katrinka Blazing - Stress test - will request records

## 2013-01-24 NOTE — Progress Notes (Signed)
Anesthesia Chart Review: Patient is a 79 year old female scheduled for excision of left chest wall mass X 2 and left abdominal wall mass on 01/26/13 by Dr. [name]. There is some concern that the small chest wall mass could represent a recurrence of breast cancer.  The other masses are felt most consistent with symptomatic lipomas.  History includes former smoker, HTN, DM2, breast cancer s/p bilateral mastectomies with radiation on the right, arthritis, hysterectomy, appendectomy, ventral hernia repair '12. PCP is Dr. Lovell Sheehan.  She is not followed by cardiology, but has been evaluated by cardiologist Dr. Zoila Shutter in 2011 and Dr. Verdis Prime in 2012 for SOB and question of atypical chest pain.  She had a normal perfusion scan but with markedly reduced aerobic exercise tolerance by stress test on 11/28/09 [facility]).  It appears PRN follow-up was recommended unless symptoms worsened.  EKG on 01/21/13 showed SR with first degree AVB, occasional PVC, minimal voltage criteria for LVH.  Overall, I think it is stable when compared to her EKG from 03/05/11 [facility]).  CXR on 01/21/13 showed: Cardiomegaly and bronchitic change without acute cardiopulmonary disease.  Preoperative labs noted.  She will get a fasting CBG on arrival.  I think her EKG is stable.  No CV symptoms were documented at her PAT visit or at her last office visit with Dr. Magnus Ivan.  She will be evaluated by her assigned anesthesiologist, but if no acute changes then I would anticipate that she could proceed as planned.  Velna Ochs [facility] Short Stay Center/Anesthesiology Phone [phone] 01/24/2013 12:27 PM

## 2013-01-24 NOTE — Progress Notes (Signed)
Error

## 2013-01-25 MED ORDER — CEFAZOLIN SODIUM-DEXTROSE 2-3 GM-% IV SOLR
2.0000 g | INTRAVENOUS | Status: AC
  Administered 2013-01-26: 2 g via INTRAVENOUS
  Filled 2013-01-25: qty 50

## 2013-01-25 NOTE — H&P (Signed)
Chief Complaint   Patient presents with   .  Other     left chest wall lump   HPI  Tami Schmidt is a 79 y.o. female.  HPI  This is a 79 year old female who I have operated on in the past for a ventral incisional hernia. She is in complaining about a mass on her chest wall. She has had previous bilateral mastectomies done in [place]. I believe the left was prophylactic. She had radiation therapy to the right side. She has had a large soft tissue mass on the left chest as well as on the left abdominal wall. She noticed an area on the medial chest and left side That began to dimple approximately 3 months ago. She is otherwise without complaints. The large chest wall mass and abdominal mass had been there for years now becoming larger and causing increased discomfort  Past Medical History   Diagnosis  Date   .  Diabetes mellitus    .  Hypertension     Past Surgical History   Procedure  Laterality  Date   .  Abdominal hysterectomy     .  Hernia repair   04-26-2010   History reviewed. No pertinent family history.  Social History  History   Substance Use Topics   .  Smoking status:  Former Smoker     Types:  Cigarettes     Quit date:  04/14/1972   .  Smokeless tobacco:  Never Used   .  Alcohol Use:  No    Allergies   Allergen  Reactions   .  Bactrim    .  Lisinopril    .  Vasotec     Current Outpatient Prescriptions   Medication  Sig  Dispense  Refill   .  amLODipine (NORVASC) 10 MG tablet  Take 10 mg by mouth daily.     Marland Kitchen  BIOTIN PO  Take by mouth daily.     .  Chlorphen-Pyril-Phenyleph (TRIPLEX AD PO)  Take by mouth daily.     .  Choline Fenofibrate (FENOFIBRIC ACID) 135 MG CPDR      .  fish oil-omega-3 fatty acids 1000 MG capsule  Take 2 g by mouth daily.     .  Folic Acid-Vit B6-Vit B12 0.5-5-0.2 MG TABS  Take by mouth daily.     Marland Kitchen  HYDROcodone-acetaminophen (NORCO/VICODIN) 5-325 MG per tablet      .  Insulin Isophane Human (HUMULIN [place])  Inject 35 Units into the skin  every morning. 20 units in pm     .  losartan-hydrochlorothiazide (HYZAAR) 100-25 MG per tablet  Take by mouth daily.     .  metFORMIN (GLUCOPHAGE) 500 MG tablet  Take 500 mg by mouth daily with breakfast.      No current facility-administered medications for this visit.   Review of Systems  Review of Systems  Constitutional: Negative for fever, chills and unexpected weight change.  HENT: Negative for hearing loss, congestion, sore throat, trouble swallowing and voice change.  Eyes: Negative for visual disturbance.  Respiratory: Negative for cough and wheezing.  Cardiovascular: Negative for chest pain, palpitations and leg swelling.  Gastrointestinal: Negative for nausea, vomiting, abdominal pain, diarrhea, constipation, blood in stool, abdominal distention and anal bleeding.  Genitourinary: Negative for hematuria, vaginal bleeding and difficulty urinating.  Musculoskeletal: Negative for arthralgias.  Skin: Negative for rash and wound.  Neurological: Negative for seizures, syncope and headaches.  Hematological: Negative for adenopathy. Does not bruise/bleed  easily.  Psychiatric/Behavioral: Negative for confusion.  Blood pressure 128/77, pulse 70, temperature 97.6 F (36.4 C), temperature source Temporal, resp. rate 14, height 5\' 7"  (1.702 m), weight 238 lb 12.8 oz (108.319 kg).  Physical Exam  Physical Exam  Constitutional: She is oriented to person, place, and time. No distress.  Morbidly obese  HENT:  Head: Normocephalic and atraumatic.  Right Ear: External ear normal.  Left Ear: External ear normal.  Nose: Nose normal.  Mouth/Throat: Oropharynx is clear and moist. No oropharyngeal exudate.  Eyes: Conjunctivae are normal. Pupils are equal, round, and reactive to light.  Neck: Normal range of motion. Neck supple. No tracheal deviation present.  Cardiovascular: Normal rate, regular rhythm and intact distal pulses.  Murmur heard.  Pulmonary/Chest: Effort normal and breath sounds  normal. No respiratory distress. She has no wheezes.  She has a well-healed mastectomy incision bilaterally. There is a firm 1 cm area which is dimpled on the medial left chest above the mastectomy incision. She also has a large 6 cm mass on the left chest at the clavicle which is soft and spongy and consistent with a lipoma  Abdominal: Soft. Bowel sounds are normal.  There is an 8 cm soft mass on her left mid abdominal wall consistent with a lipoma  Musculoskeletal: Normal range of motion. She exhibits no edema and no tenderness.  Lymphadenopathy:  She has no cervical adenopathy.  Neurological: She is alert and oriented to person, place, and time.  Skin: Skin is warm and dry. She is not diaphoretic. No erythema.  Psychiatric: Her behavior is normal. Judgment normal.  Data Reviewed  Assessment  Chest wall mass x2 and abdominal wall mass of uncertain etiology  Plan  I am worried that the smaller chest wall mass could represent a breast cancer. I believe is very need be excised. The larger mass in the chest wall and abdominal wall are probably lipomas but they're becoming symptomatic so removal is also recommended. I discussed this with her in detail and she is either to proceed with surgery. I discussed the risks which includes but is not limited to bleeding, infection, need for further surgery should malignancy be present, et Karie Soda. She understands and wishes to proceed.

## 2013-01-26 ENCOUNTER — Encounter (HOSPITAL_COMMUNITY): Payer: Medicare Other | Admitting: Vascular Surgery

## 2013-01-26 ENCOUNTER — Ambulatory Visit (HOSPITAL_COMMUNITY): Payer: Medicare Other | Admitting: Anesthesiology

## 2013-01-26 ENCOUNTER — Encounter (HOSPITAL_COMMUNITY)
Admission: RE | Disposition: A | Discharge status: Home / Self Care | Payer: Self-pay | Source: Ambulatory Visit | Attending: Surgery

## 2013-01-26 ENCOUNTER — Encounter (HOSPITAL_COMMUNITY): Payer: Self-pay | Admitting: *Deleted

## 2013-01-26 ENCOUNTER — Ambulatory Visit (HOSPITAL_COMMUNITY)
Admission: RE | Admit: 2013-01-26 | Discharge: 2013-01-26 | Disposition: A | Discharge status: Home / Self Care | Payer: Medicare Other | Source: Ambulatory Visit | Attending: Surgery | Admitting: Surgery

## 2013-01-26 DIAGNOSIS — C50919 Malignant neoplasm of unspecified site of unspecified female breast: Secondary | ICD-10-CM | POA: Insufficient documentation

## 2013-01-26 DIAGNOSIS — C761 Malignant neoplasm of thorax: Secondary | ICD-10-CM

## 2013-01-26 DIAGNOSIS — I10 Essential (primary) hypertension: Secondary | ICD-10-CM | POA: Insufficient documentation

## 2013-01-26 DIAGNOSIS — Z87891 Personal history of nicotine dependence: Secondary | ICD-10-CM | POA: Insufficient documentation

## 2013-01-26 DIAGNOSIS — Z794 Long term (current) use of insulin: Secondary | ICD-10-CM | POA: Insufficient documentation

## 2013-01-26 DIAGNOSIS — Z853 Personal history of malignant neoplasm of breast: Secondary | ICD-10-CM | POA: Insufficient documentation

## 2013-01-26 DIAGNOSIS — D1739 Benign lipomatous neoplasm of skin and subcutaneous tissue of other sites: Secondary | ICD-10-CM | POA: Insufficient documentation

## 2013-01-26 DIAGNOSIS — Z79899 Other long term (current) drug therapy: Secondary | ICD-10-CM | POA: Insufficient documentation

## 2013-01-26 DIAGNOSIS — E119 Type 2 diabetes mellitus without complications: Secondary | ICD-10-CM | POA: Insufficient documentation

## 2013-01-26 HISTORY — PX: MASS EXCISION: SHX2000

## 2013-01-26 LAB — GLUCOSE, CAPILLARY
Glucose-Capillary: 97 mg/dL (ref 70–99)
Glucose-Capillary: 101 mg/dL — ABNORMAL HIGH (ref 70–99)

## 2013-01-26 SURGERY — EXCISION MASS
Anesthesia: General | Site: Chest | Laterality: Left | Wound class: Clean

## 2013-01-26 MED ORDER — LIDOCAINE HCL (CARDIAC) 20 MG/ML IV SOLN
INTRAVENOUS | Status: DC | PRN
  Administered 2013-01-26: 80 mg via INTRAVENOUS

## 2013-01-26 MED ORDER — BUPIVACAINE-EPINEPHRINE 0.25% -1:200000 IJ SOLN
INTRAMUSCULAR | Status: DC | PRN
  Administered 2013-01-26: 30 mL

## 2013-01-26 MED ORDER — BUPIVACAINE-EPINEPHRINE PF 0.25-1:200000 % IJ SOLN
INTRAMUSCULAR | Status: AC
  Filled 2013-01-26: qty 30

## 2013-01-26 MED ORDER — ACETAMINOPHEN 650 MG RE SUPP: 650.0000 mg | RECTAL | Status: DC | PRN

## 2013-01-26 MED ORDER — 0.9 % SODIUM CHLORIDE (POUR BTL) OPTIME
TOPICAL | Status: DC | PRN
  Administered 2013-01-26: 1000 mL

## 2013-01-26 MED ORDER — SUCCINYLCHOLINE CHLORIDE 20 MG/ML IJ SOLN
INTRAMUSCULAR | Status: DC | PRN
  Administered 2013-01-26: 100 mg via INTRAVENOUS

## 2013-01-26 MED ORDER — LACTATED RINGERS IV SOLN
INTRAVENOUS | Status: DC
  Administered 2013-01-26 (×2): via INTRAVENOUS

## 2013-01-26 MED ORDER — ONDANSETRON HCL 4 MG/2ML IJ SOLN: 4.0000 mg | Freq: Four times a day (QID) | INTRAMUSCULAR | Status: DC | PRN

## 2013-01-26 MED ORDER — OXYCODONE HCL 5 MG PO TABS: 5.0000 mg | ORAL_TABLET | ORAL | Status: DC | PRN

## 2013-01-26 MED ORDER — ONDANSETRON HCL 4 MG/2ML IJ SOLN: 4.0000 mg | Freq: Once | INTRAMUSCULAR | Status: DC | PRN

## 2013-01-26 MED ORDER — MIDAZOLAM HCL 5 MG/5ML IJ SOLN
INTRAMUSCULAR | Status: DC | PRN
  Administered 2013-01-26: 2 mg via INTRAVENOUS

## 2013-01-26 MED ORDER — MORPHINE SULFATE 4 MG/ML IJ SOLN: 4.0000 mg | INTRAMUSCULAR | Status: DC | PRN

## 2013-01-26 MED ORDER — EPHEDRINE SULFATE 50 MG/ML IJ SOLN
INTRAMUSCULAR | Status: DC | PRN
  Administered 2013-01-26: 10 mg via INTRAVENOUS

## 2013-01-26 MED ORDER — ACETAMINOPHEN 325 MG PO TABS: 650.0000 mg | ORAL_TABLET | ORAL | Status: DC | PRN

## 2013-01-26 MED ORDER — SODIUM CHLORIDE 0.9 % IV SOLN: 250.0000 mL | INTRAVENOUS | Status: DC | PRN

## 2013-01-26 MED ORDER — PROPOFOL 10 MG/ML IV BOLUS
INTRAVENOUS | Status: DC | PRN
  Administered 2013-01-26: 50 mg via INTRAVENOUS
  Administered 2013-01-26: 200 mg via INTRAVENOUS

## 2013-01-26 MED ORDER — SODIUM CHLORIDE 0.9 % IJ SOLN: 3.0000 mL | INTRAMUSCULAR | Status: DC | PRN

## 2013-01-26 MED ORDER — HYDROCODONE-ACETAMINOPHEN 5-325 MG PO TABS: 1.0000 | ORAL_TABLET | ORAL | Status: DC | PRN

## 2013-01-26 MED ORDER — HYDROMORPHONE HCL PF 1 MG/ML IJ SOLN: 0.2500 mg | INTRAMUSCULAR | Status: DC | PRN

## 2013-01-26 MED ORDER — SUFENTANIL CITRATE 50 MCG/ML IV SOLN
INTRAVENOUS | Status: DC | PRN
  Administered 2013-01-26 (×2): 10 ug via INTRAVENOUS

## 2013-01-26 MED ORDER — SODIUM CHLORIDE 0.9 % IJ SOLN: 3.0000 mL | Freq: Two times a day (BID) | INTRAMUSCULAR | Status: DC

## 2013-01-26 SURGICAL SUPPLY — 42 items
BENZOIN TINCTURE PRP APPL 2/3 (GAUZE/BANDAGES/DRESSINGS) ×6 IMPLANT
BLADE SURG 10 STRL SS (BLADE) ×2 IMPLANT
BLADE SURG 15 STRL LF DISP TIS (BLADE) ×1 IMPLANT
BLADE SURG 15 STRL SS (BLADE) ×1
CANISTER SUCTION 2500CC (MISCELLANEOUS) ×2 IMPLANT
COVER SURGICAL LIGHT HANDLE (MISCELLANEOUS) ×2 IMPLANT
DECANTER SPIKE VIAL GLASS SM (MISCELLANEOUS) ×2 IMPLANT
DRAPE LAPAROSCOPIC ABDOMINAL (DRAPES) ×2 IMPLANT
DRAPE PED LAPAROTOMY (DRAPES) IMPLANT
DRSG TEGADERM 4X4.75 (GAUZE/BANDAGES/DRESSINGS) ×6 IMPLANT
ELECT CAUTERY BLADE 6.4 (BLADE) ×2 IMPLANT
ELECT REM PT RETURN 9FT ADLT (ELECTROSURGICAL) ×2
ELECTRODE REM PT RTRN 9FT ADLT (ELECTROSURGICAL) ×1 IMPLANT
GLOVE BIO SURGEON STRL SZ7.5 (GLOVE) ×4 IMPLANT
GLOVE BIOGEL PI IND STRL 7.0 (GLOVE) ×1 IMPLANT
GLOVE BIOGEL PI IND STRL 7.5 (GLOVE) ×2 IMPLANT
GLOVE BIOGEL PI INDICATOR 7.0 (GLOVE) ×1
GLOVE BIOGEL PI INDICATOR 7.5 (GLOVE) ×2
GLOVE SURG SIGNA 7.5 PF LTX (GLOVE) ×2 IMPLANT
GLOVE SURG SS PI 7.0 STRL IVOR (GLOVE) ×2 IMPLANT
GOWN STRL NON-REIN LRG LVL3 (GOWN DISPOSABLE) ×6 IMPLANT
GOWN STRL REIN XL XLG (GOWN DISPOSABLE) ×2 IMPLANT
KIT BASIN OR (CUSTOM PROCEDURE TRAY) ×2 IMPLANT
KIT ROOM TURNOVER OR (KITS) ×2 IMPLANT
NEEDLE HYPO 25X1 1.5 SAFETY (NEEDLE) ×2 IMPLANT
NS IRRIG 1000ML POUR BTL (IV SOLUTION) ×2 IMPLANT
PACK SURGICAL SETUP 50X90 (CUSTOM PROCEDURE TRAY) ×2 IMPLANT
PAD ARMBOARD 7.5X6 YLW CONV (MISCELLANEOUS) ×4 IMPLANT
PENCIL BUTTON HOLSTER BLD 10FT (ELECTRODE) ×2 IMPLANT
SPECIMEN JAR SMALL (MISCELLANEOUS) IMPLANT
SPONGE GAUZE 4X4 12PLY (GAUZE/BANDAGES/DRESSINGS) ×6 IMPLANT
SPONGE LAP 18X18 X RAY DECT (DISPOSABLE) ×2 IMPLANT
STRIP CLOSURE SKIN 1/2X4 (GAUZE/BANDAGES/DRESSINGS) ×6 IMPLANT
SUT MNCRL AB 4-0 PS2 18 (SUTURE) ×2 IMPLANT
SUT VIC AB 3-0 SH 27 (SUTURE) ×1
SUT VIC AB 3-0 SH 27XBRD (SUTURE) ×1 IMPLANT
SYR BULB 3OZ (MISCELLANEOUS) ×2 IMPLANT
SYR CONTROL 10ML LL (SYRINGE) ×2 IMPLANT
TOWEL OR 17X24 6PK STRL BLUE (TOWEL DISPOSABLE) ×2 IMPLANT
TOWEL OR 17X26 10 PK STRL BLUE (TOWEL DISPOSABLE) ×2 IMPLANT
TUBE CONNECTING 12X1/4 (SUCTIONS) ×2 IMPLANT
YANKAUER SUCT BULB TIP NO VENT (SUCTIONS) ×2 IMPLANT

## 2013-01-26 NOTE — Interval H&P Note (Signed)
History and Physical Interval Note:no change in H and P  01/26/2013 10:37 AM  Tami Schmidt  has presented today for surgery, with the diagnosis of chest wass mass and abd wall mass  The various methods of treatment have been discussed with the patient and family. After consideration of risks, benefits and other options for treatment, the patient has consented to  Procedure(s): EXCISION LEFT CHEST WALL MASS AND LEFT ABDOMNAL WALL MASS (Left) as a surgical intervention .  The patient's history has been reviewed, patient examined, no change in status, stable for surgery.  I have reviewed the patient's chart and labs.  Questions were answered to the patient's satisfaction.     Valin Massie A

## 2013-01-26 NOTE — Anesthesia Procedure Notes (Signed)
Procedure Name: Intubation and LMA Insertion Date/Time: 01/26/2013 1:31 PM Performed by: Charm Barges, Gyneth Hubka R Pre-anesthesia Checklist: Patient identified, Emergency Drugs available, Suction available, Patient being monitored and Timeout performed Patient Re-evaluated:Patient Re-evaluated prior to inductionOxygen Delivery Method: Circle system utilized Preoxygenation: Pre-oxygenation with 100% oxygen Intubation Type: IV induction Ventilation: Mask ventilation with difficulty and Oral airway inserted - appropriate to patient size LMA: LMA inserted LMA Size: 4.0 Laryngoscope Size: Mac and 4 Grade View: Grade II Tube type: Oral Tube size: 7.5 mm Number of attempts: 2 (4 LMA inserted but unable to ventilate, very difficult to mask ventilate after LMA removed, Succinylcholine given, AOI) Airway Equipment and Method: Stylet Placement Confirmation: ETT inserted through vocal cords under direct vision,  positive ETCO2 and breath sounds checked- equal and bilateral Secured at: 22 cm Tube secured with: Tape Dental Injury: Teeth and Oropharynx as per pre-operative assessment

## 2013-01-26 NOTE — Transfer of Care (Signed)
Immediate Anesthesia Transfer of Care Note  Patient: Tami Schmidt  Procedure(s) Performed: Procedure(s): EXCISION LEFT CHEST WALL MASS AND LEFT ABDOMNAL WALL MASS (Left)  Patient Location: PACU  Anesthesia Type:General  Level of Consciousness: awake  Airway & Oxygen Therapy: Patient Spontanous Breathing and Patient connected to nasal cannula oxygen  Post-op Assessment: Report given to PACU RN, Post -op Vital signs reviewed and stable and Patient moving all extremities  Post vital signs: Reviewed and stable  Complications: No apparent anesthesia complications

## 2013-01-26 NOTE — Anesthesia Preprocedure Evaluation (Signed)
Anesthesia Evaluation  Patient identified by MRN, date of birth, ID band Patient awake, Patient confused and Patient unresponsive    Reviewed: Allergy & Precautions, H&P , NPO status , Patient's Chart, lab work & pertinent test results  Airway Mallampati: II TM Distance: >3 FB Neck ROM: Full    Dental  (+) Teeth Intact and Dental Advisory Given   Pulmonary  breath sounds clear to auscultation        Cardiovascular Rhythm:Regular Rate:Normal     Neuro/Psych    GI/Hepatic   Endo/Other    Renal/GU      Musculoskeletal   Abdominal   Peds  Hematology   Anesthesia Other Findings   Reproductive/Obstetrics                           Anesthesia Physical Anesthesia Plan  ASA: III  Anesthesia Plan: General   Post-op Pain Management:    Induction: Intravenous  Airway Management Planned: LMA  Additional Equipment:   Intra-op Plan:   Post-operative Plan: Extubation in OR  Informed Consent: I have reviewed the patients History and Physical, chart, labs and discussed the procedure including the risks, benefits and alternatives for the proposed anesthesia with the patient or authorized representative who has indicated his/her understanding and acceptance.   Dental advisory given  Plan Discussed with:   Anesthesia Plan Comments: (Chest wall mass H/O bilat breast ca S/P bilat mastectomy Type 2 DM glucose 97 Hypertension  Plan GA with LMA  Kipp Brood, MD)        Anesthesia Quick Evaluation

## 2013-01-26 NOTE — Op Note (Signed)
EXCISION LEFT CHEST WALL MASS AND LEFT ABDOMNAL WALL MASS  Procedure Note  Tami Schmidt 01/26/2013   Pre-op Diagnosis: CHEST WALL MASS X2, ABDOMINAL WALL MASS     Post-op Diagnosis: same  Procedure(s): EXCISION LEFT CHEST WALL MASS X2  (5cm and 2 cm) AND LEFT ABDOMNAL WALL MASS(10 cm)  Surgeon(s): [name], MD  Anesthesia: Choice  Staff:  Circulator: Gerre Pebbles Sipsis, RN Scrub Person: Lina Sayre, RN; Denese Killings, CST Circulator Assistant: Lina Sayre, RN; Jani Files, RN  Estimated Blood Loss: Minimal               Specimens: sent to path          [facility] A   Date: 01/26/2013  Time: 2:25 PM

## 2013-01-26 NOTE — Preoperative (Signed)
Beta Blockers   Reason not to administer Beta Blockers:Not Applicable 

## 2013-01-27 NOTE — Op Note (Signed)
NAMECHARNITA, TRUDEL NO.:  [id number]  MEDICAL RECORD NO.:  [id number]  LOCATION:  [facility]                         FACILITY:  [facility]  PHYSICIAN:  [name], M.D. DATE OF BIRTH:  [birth year]  DATE OF PROCEDURE:  01/26/2013 DATE OF DISCHARGE:  01/26/2013                              OPERATIVE REPORT   PREOPERATIVE DIAGNOSES: 1. Left chest wall mass x2. 2. Abdominal wall mass.  POSTOPERATIVE DIAGNOSES: 1. Left chest wall mass x2. 2. Abdominal wall mass.  PROCEDURES: 1. Excision of left chest wall mass x2. 2. Excision of left abdominal wall mass.  SURGEON:  [name], M.D.  ANESTHESIA:  General endotracheal anesthesia and 0.5% Marcaine.  ESTIMATED BLOOD LOSS:  Minimal.  INDICATIONS:  This is a 79 year old female with a previous history of breast cancer, status post bilateral mastectomies, who presents with a 3- month history of a dimpling area on her left chest above the old mastectomy incision medially.  There is a dimpled area with a firm mass underneath.  She also has a large lipoma at the area of the clavicle on the left chest and on her abdominal wall, which were both symptomatic and she requests removal of these while removing the chest wall mass.  FINDINGS:  The patient was found to have an abdominal mass approximately 10 cm in size, consistent with lipoma, a 5-cm left chest mass over the clavicle consistent with lipoma, and a 2-cm medial chest wall mass of uncertain etiology.  All were sent to Pathology for evaluation.  PROCEDURE IN DETAIL:  The patient was brought to the operating room, identified as Electrical engineer.  She was placed supine on the operating table and general anesthesia was induced.  Her abdomen and chest were then prepped and draped in usual sterile fashion.  The mass on the left lateral abdominal wall was identified.  I made an incision with the scalpel transversely across the mass, so I took this down to  the subcutaneous tissue with electrocautery and removed approximately a 10- cm lipoma.  This was removed in its entirety with electrocautery.  I then anesthetized the wound with saline and achieved hemostasis with cautery.  I closed the subcutaneous tissue with interrupted 3-0 Vicryl sutures and closed the skin with a running 4-0 Monocryl.  Next, I made a transverse incision at the area of the clavicle over the chest wall mass with a scalpel.  I took this down to the subcutaneous tissue with electrocautery and again likewise moved to approximately a 5-cm lipoma from this area.  I then closed the subcutaneous tissue with interrupted 3-0 Vicryl sutures and closed the skin with a interrupted 4-0 Monocryl after obtaining hemostasis as well.  I anesthetized this and also with Marcaine.  Lastly, I turned my attention towards the chest wall mass.  I made elliptical incision on the skin for approximately 2-2.5 cm in size around the dimpled area of skin.  I took this down into the subcutaneous tissue and dissected out the whole area with the cautery going down to the pectoralis muscle.  There was a firm mass in the subcutaneous tissue, which I completely excised with the electrocautery.  This was also sent to  Pathology fresh for evaluation.  I anesthetized the wound further with Marcaine.  I closed the subcutaneous tissue with interrupted 3-0 Vicryl sutures and closed the skin with running 4-0 Monocryl.  Steri-Strips, gauze, and Tegaderm were then placed on all wounds.  The patient tolerated the procedure well.  All the counts were correct at the end of the procedure.  The patient was then extubated in the operating room and taken in stable condition to the recovery room.     [name], M.D.     DB/MEDQ  D:  01/26/2013  T:  01/27/2013  Job:  [id number]

## 2013-01-28 ENCOUNTER — Encounter (HOSPITAL_COMMUNITY): Payer: Self-pay | Admitting: Surgery

## 2013-02-02 NOTE — Anesthesia Postprocedure Evaluation (Signed)
  Anesthesia Post-op Note  Patient: Tami Schmidt  Procedure(s) Performed: Procedure(s): EXCISION LEFT CHEST WALL MASS AND LEFT ABDOMNAL WALL MASS (Left)  Patient Location: PACU  Anesthesia Type:General  Level of Consciousness: awake, alert  and oriented  Airway and Oxygen Therapy: Patient Spontanous Breathing  Post-op Pain: mild  Post-op Assessment: Post-op Vital signs reviewed, Patient's Cardiovascular Status Stable, Respiratory Function Stable and Patent Airway  Post-op Vital Signs: stable  Complications: No apparent anesthesia complications

## 2013-02-03 NOTE — Anesthesia Postprocedure Evaluation (Signed)
  Anesthesia Post-op Note  Patient: Tami Schmidt  Procedure(s) Performed: Procedure(s): EXCISION LEFT CHEST WALL MASS AND LEFT ABDOMNAL WALL MASS (Left)  Patient Location: PACU  Anesthesia Type:General  Level of Consciousness: awake, alert  and oriented  Airway and Oxygen Therapy: Patient Spontanous Breathing and Patient connected to nasal cannula oxygen  Post-op Pain: mild  Post-op Assessment: Post-op Vital signs reviewed, Patient's Cardiovascular Status Stable, Respiratory Function Stable and Pain level controlled  Post-op Vital Signs: stable  Complications: No apparent anesthesia complications

## 2013-02-07 ENCOUNTER — Other Ambulatory Visit (INDEPENDENT_AMBULATORY_CARE_PROVIDER_SITE_OTHER): Payer: Self-pay | Admitting: Surgery

## 2013-02-07 ENCOUNTER — Encounter (INDEPENDENT_AMBULATORY_CARE_PROVIDER_SITE_OTHER): Payer: Self-pay | Admitting: Surgery

## 2013-02-07 ENCOUNTER — Ambulatory Visit (INDEPENDENT_AMBULATORY_CARE_PROVIDER_SITE_OTHER): Payer: Medicare Other | Admitting: Surgery

## 2013-02-07 VITALS — BP 154/72 | HR 68 | Resp 16 | Ht 67.0 in | Wt 236.4 lb

## 2013-02-07 DIAGNOSIS — C50912 Malignant neoplasm of unspecified site of left female breast: Secondary | ICD-10-CM

## 2013-02-07 DIAGNOSIS — Z09 Encounter for follow-up examination after completed treatment for conditions other than malignant neoplasm: Secondary | ICD-10-CM

## 2013-02-07 MED ORDER — HYDROCODONE-ACETAMINOPHEN 5-325 MG PO TABS: 1.0000 | ORAL_TABLET | ORAL | Status: DC | PRN

## 2013-02-07 NOTE — Progress Notes (Signed)
Subjective:     Patient ID: Tami Schmidt, female   DOB: [birth year], 79 y.o.   MRN: [id number]  HPI She is here for her first postop visit status post removal of 2 lipomas as well as a chest wall mass. The medial chest wall mass and it being an invasive cancer. I had already discussed this with her. Today, she has no complaints  Review of Systems     Objective:   Physical Exam On exam, her incisions are healing well. There is a mild seroma at the lipoma excision site at the clavicle. The small medial incision with the cancer was present is healing well.  Again, the final pathology showed this to be an invasive cancer which was 100% estrogen receptor positive progesterone negative    Assessment:     Patient stable postop with left breast cancer  Status post previous bilateral mastectomies with radiation to the right chest wall.     Plan:     She'll be getting scheduled to see postirradiation medical oncologist. Will also get an MRI to determine our next step in her therapy. I will call her back with results.

## 2013-02-08 ENCOUNTER — Telehealth (INDEPENDENT_AMBULATORY_CARE_PROVIDER_SITE_OTHER): Payer: Self-pay | Admitting: *Deleted

## 2013-02-08 ENCOUNTER — Encounter: Payer: Self-pay | Admitting: Radiation Oncology

## 2013-02-08 DIAGNOSIS — C50919 Malignant neoplasm of unspecified site of unspecified female breast: Secondary | ICD-10-CM | POA: Insufficient documentation

## 2013-02-08 NOTE — Progress Notes (Signed)
Location of Breast Cancer: LEFT chest wall Breast cancer Dx 4098 B/l mastectomies  Histology per Pathology Report:  01/26/13: Diagnosis1. Soft tissue mass, simple excision, Left abdominal wall- MATURE ADIPOSE TISSUE, CONSISTENT WITH LIPOMA.2. Soft tissue mass, simple excision, Left clavicle - MATURE ADIPOSE TISSUE, CONSISTENT WITH LIPOMA.3. Soft tissue mass, simple excision, Left chest wall- INVASIVE CARCINOMA.- CARCINOMA IS LESS THAN 0.1 CM FROM THE NEAREST NON-ORIENTED TISSUE MARGIN.  Receptor Status: ER(+), PR (-), Her2-neu (-)  Did patient present with symptoms (if so, please note symptoms) or was this found on screening mammography?:patient found mass on left chest wall,  That dimpled about 3 months ago,had been there for years as well as abdominal mass  Past/Anticipated interventions by surgeon, if any: 01/26/13:Procedure(s): EXCISION LEFT CHEST WALL MASS X2 (5cm and 2 cm) AND LEFT ABDOMNAL WALL MASS(10 cm) [name], MD   Past/Anticipated interventions by medical oncology, if any: Chemotherapy   Lymphedema issues, if any:  Yes slight  left arm Pain issues, if any: no SAFETY ISSUES:no  Prior radiation?yes 1993 right breast only with Dr. Rebbeca Paul, [place],  Pacemaker/ICD? yes  Possible current pregnancy?no  Is the patient on methotrexate? no  Current Complaints / other details:  [place] b/l [place]omies [facility] [place]     Tami Petties, RN 02/08/2013,12:11 PM

## 2013-02-08 NOTE — Telephone Encounter (Signed)
I called pt to inform her of the appt for her MRI at [id number] on 02/16/14 with an arrival time of 6:00 pm.  Pt agreeable to appt info.

## 2013-02-09 ENCOUNTER — Telehealth: Payer: Self-pay | Admitting: *Deleted

## 2013-02-09 ENCOUNTER — Encounter: Payer: Self-pay | Admitting: Radiation Oncology

## 2013-02-09 ENCOUNTER — Ambulatory Visit
Admission: RE | Admit: 2013-02-09 | Discharge: 2013-02-09 | Disposition: A | Discharge status: Home / Self Care | Payer: Medicare Other | Source: Ambulatory Visit | Attending: Radiation Oncology | Admitting: Radiation Oncology

## 2013-02-09 VITALS — BP 141/76 | HR 75 | Temp 98.8°F | Resp 20 | Ht 67.0 in | Wt 239.5 lb

## 2013-02-09 DIAGNOSIS — Z794 Long term (current) use of insulin: Secondary | ICD-10-CM | POA: Insufficient documentation

## 2013-02-09 DIAGNOSIS — C50919 Malignant neoplasm of unspecified site of unspecified female breast: Secondary | ICD-10-CM | POA: Insufficient documentation

## 2013-02-09 DIAGNOSIS — Z853 Personal history of malignant neoplasm of breast: Secondary | ICD-10-CM | POA: Insufficient documentation

## 2013-02-09 DIAGNOSIS — Z170 Estrogen receptor positive status [ER+]: Secondary | ICD-10-CM | POA: Insufficient documentation

## 2013-02-09 DIAGNOSIS — I890 Lymphedema, not elsewhere classified: Secondary | ICD-10-CM | POA: Insufficient documentation

## 2013-02-09 DIAGNOSIS — Z79899 Other long term (current) drug therapy: Secondary | ICD-10-CM | POA: Insufficient documentation

## 2013-02-09 DIAGNOSIS — R222 Localized swelling, mass and lump, trunk: Secondary | ICD-10-CM

## 2013-02-09 DIAGNOSIS — E119 Type 2 diabetes mellitus without complications: Secondary | ICD-10-CM | POA: Insufficient documentation

## 2013-02-09 DIAGNOSIS — Z9010 Acquired absence of unspecified breast and nipple: Secondary | ICD-10-CM | POA: Insufficient documentation

## 2013-02-09 DIAGNOSIS — Z923 Personal history of irradiation: Secondary | ICD-10-CM | POA: Insufficient documentation

## 2013-02-09 DIAGNOSIS — I10 Essential (primary) hypertension: Secondary | ICD-10-CM | POA: Insufficient documentation

## 2013-02-09 DIAGNOSIS — C50912 Malignant neoplasm of unspecified site of left female breast: Secondary | ICD-10-CM

## 2013-02-09 HISTORY — DX: Malignant neoplasm of unspecified site of unspecified female breast: C50.919

## 2013-02-09 HISTORY — DX: Pure hypercholesterolemia, unspecified: E78.00

## 2013-02-09 HISTORY — DX: Personal history of irradiation: Z92.3

## 2013-02-09 NOTE — Progress Notes (Signed)
Please see the Nurse Progress Note in the MD Initial Consult Encounter for this patient. 

## 2013-02-09 NOTE — Telephone Encounter (Signed)
Left message for pt to return my call so I can schedule her a med onc appt.  

## 2013-02-10 NOTE — Progress Notes (Signed)
Radiation Oncology         [phone]) [phone] ________________________________  Name: Tami Schmidt MRN: [id number]  Date: 02/09/2013  DOB: 29-Sep-1932  WU:[id number] C, MD  [name], MD     REFERRING PHYSICIAN: [name], MD   DIAGNOSIS: The encounter diagnosis was Breast cancer, left.   HISTORY OF PRESENT ILLNESS::Tami Schmidt is a 79 y.o. female who is seen for an initial consultation visit. The patient is seen today regarding recurrent breast cancer on the left. She has a complicated oncologic history and we are in the process of obtaining additional medical records. The patient has lived in [place] and was treated multiple times at [facility].   She states that she was originally diagnosed with a right-sided breast cancer in 1993. She underwent a lumpectomy followed by adjuvant radiation treatment. She did not receive any chemotherapy.  The patient then had a recurrence 2 years later which led to a mastectomy on the right. She had a breast reduction on the left at this time for cosmetic reasons. No further treatment on the right side from the mastectomy. 9 years later in 2004 the patient indicates that she was diagnosed with left-sided breast cancer. She then proceeded with a mastectomy on this side. No radiation treatment or chemotherapy given. In terms of systemic treatment she indicates that she has been on tamoxifen several times. She was originally started on this in 1993 or 1994 and then restarted this again after the mastectomy on the left.  More recently the patient noted some laying in the left chest wall medially which began several months ago. She subsequently proceeded with resection of this mass and also to lipomas, one in the left chest more superiorly and also in the abdominal region. 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 Receptor studies have  indicated that the tumor is ER positive, PR negative, and HER-2/neu negative. The proliferative index was 22% with regards to Ki-67 staining.   I have therefore been asked to see the patient for consideration of radiation treatment. She is also being scheduled for an MRI scan and also being scheduled to see medical oncology.    PREVIOUS RADIATION THERAPY:Yes as above, with the patient receiving adjuvant radiation treatment after lumpectomy to the right breast and 1993.   PAST MEDICAL HISTORY:  has a past medical history of Hypertension; Cancer; Arthritis; Breast cancer; Diabetes mellitus; Hypercholesterolemia; and radiation therapy.     PAST SURGICAL HISTORY: Past Surgical History  Procedure Laterality Date  . Abdominal hysterectomy    . Hernia repair  04-26-2010  . Breast surgery Bilateral     mastectomy  . Appendectomy    . Ankle surgery Right 1995  . Mass excision Left 01/26/2013    Procedure: EXCISION LEFT CHEST WALL MASS AND LEFT ABDOMNAL WALL MASS;  Surgeon: [name], MD;  Location: [facility] OR;  Service: General;  Laterality: Left;     FAMILY HISTORY: family history is not on file.   SOCIAL HISTORY:  reports that she quit smoking about 40 years ago. Her smoking use included Cigarettes. She smoked 0.00 packs per day. She has never used smokeless tobacco. She reports that she drinks alcohol. She reports that she does not use illicit drugs.   ALLERGIES: Bactrim; Lisinopril; and Vasotec   MEDICATIONS:  Current Outpatient Prescriptions  Medication Sig Dispense Refill  . amLODipine (NORVASC) 10 MG tablet Take 10 mg by  mouth daily.      . Choline Fenofibrate (FENOFIBRIC ACID) 135 MG CPDR Take 1 tablet by mouth daily.       . fish oil-omega-3 fatty acids 1000 MG capsule Take 2 g by mouth daily.      Marland Kitchen HYDROcodone-acetaminophen (NORCO) 5-325 MG per tablet Take 1-2 tablets by mouth every 4 (four) hours as needed for pain.  40 tablet  0  . Insulin Isophane Human (HUMULIN N  [place]) Inject 20-40 Units into the skin every morning. 40 units in the am and 20 units in the pm      . losartan-hydrochlorothiazide (HYZAAR) 100-25 MG per tablet Take 1 tablet by mouth daily.       . metFORMIN (GLUCOPHAGE) 500 MG tablet Take 500 mg by mouth daily with breakfast.       No current facility-administered medications for this encounter.     REVIEW OF SYSTEMS:  A 15 point review of systems is documented in the electronic medical record. This was obtained by the nursing staff. However, I reviewed this with the patient to discuss relevant findings and make appropriate changes.  Pertinent items are noted in HPI.    PHYSICAL EXAM:  height is 5\' 7"  (1.702 m) and weight is 239 lb 8 oz (108.636 kg). Her oral temperature is 98.8 F (37.1 C). Her blood pressure is 141/76 and her pulse is 75. Her respiration is 20.   General: Well-developed, in no acute distress HEENT: Normocephalic, atraumatic; oral cavity clear Neck: Supple without any lymphadenopathy Cardiovascular: Regular rate and rhythm Respiratory: Clear to auscultation bilaterally The patient is status post bilateral mastectomy. No suspicious findings along the chest wall on the right and no right-sided axillary lymphadenopathy. The patient has a well healing surgical incision within the medial aspect of the left chest wall superior to the primary mastectomy scar. She also has a well healing incision more superiorly at the site of the lipoma. No lesions or nodularity within the left chest wall. No palpable axillary lymphadenopathy on the left. GI: Soft, nontender, normal bowel sounds Extremities:  A moderate degree of lymphedema present in the left upper extremity. Neuro: No focal deficits     LABORATORY DATA:  Lab Results  Component Value Date   WBC 8.0 01/21/2013   HGB 12.2 01/21/2013   HCT 38.3 01/21/2013   MCV 77.8* 01/21/2013   PLT 230 01/21/2013   Lab Results  Component Value Date   NA 139 01/21/2013   K 3.6  01/21/2013   CL 101 01/21/2013   CO2 24 01/21/2013   Lab Results  Component Value Date   ALT 18 04/20/2010   AST 21 04/20/2010   ALKPHOS 86 04/20/2010   BILITOT 0.5 04/20/2010      RADIOGRAPHY: Dg Chest 2 View  01/21/2013   *RADIOLOGY REPORT*  Clinical Data: Preoperative examination, initial encounter.  CHEST - 2 VIEW  Comparison: 04/23/2010; 05/20/2006  Findings:  Grossly unchanged cardiac silhouette and mediastinal contours with atherosclerotic plaque within the thoracic aorta.  Suspected partially calcified bilateral hilar lymph nodes are grossly unchanged and likely the sequela of prior granulomatous infection. There is grossly unchanged mild diffuse slightly nodular thickening of the pulmonary interstitium.  No new focal airspace opacities. No pleural effusion or pneumothorax.  No definite evidence of edema.  Grossly unchanged bones including advanced degenerative change of the thoracic spine and bilateral glenohumeral joints. Surgical clips overlie the right axilla.  IMPRESSION:  Cardiomegaly and bronchitic change without acute cardiopulmonary disease.   Original  Report Authenticated By: Tacey Ruiz, MD       IMPRESSION: The patient has recurrent breast cancer on the left after having undergone a mastectomy in 1994 her breast cancer on this side. She also has a history of right-sided breast cancer status post lumpectomy and radiation, and then subsequently mastectomy for a recurrence on this side also. The patient therefore has had a recurrence of tumor on both the left and the right. Given the specifics of her case, I believe that the patient is at significant risk for local/regional recurrence on the left and I believe that she would benefit from adjuvant radiotherapy. The patient is going to have an MRI scan completed and also is going to see medical oncology. The MRI scan will be helpful in terms of developing a overall plan for the patient.   I discussed with the patient a potential 6-1/2  week course of radiation treatment. We discussed the rationale of this treatment in terms of reducing the risk of local and regional recurrence. We also discussed the side effects and risks of treatment. The patient has had a history of radiation treatment and therefore does have some idea of what to expect. All of her questions were answered. She is interested in proceeding with treatment at the appropriate time.  PLAN: MRI scan is pending along with medical oncology appointment. Her case will be good to discuss in multidisciplinary breast conference after her MRI scan has been completed. Again, we are trying to obtain additional medical records including her treatment notes and pathology reports.   I spent 60 minutes face to face with the patient and more than 50% of that time was spent in counseling and/or coordination of care.    ________________________________   Radene Gunning, MD, PhD

## 2013-02-11 ENCOUNTER — Telehealth: Payer: Self-pay | Admitting: *Deleted

## 2013-02-11 NOTE — Telephone Encounter (Signed)
Pt returned my call and I confirmed 03/01/13 appt w/ pt.  Mailed before appt letter & packet to pt.  Emailed Annie at [facility] to make her aware.  Took paperwork to Med Rec for chart.

## 2013-02-16 ENCOUNTER — Other Ambulatory Visit: Payer: Medicare Other

## 2013-02-16 ENCOUNTER — Ambulatory Visit
Admission: RE | Admit: 2013-02-16 | Discharge: 2013-02-16 | Disposition: A | Discharge status: Home / Self Care | Payer: Medicare Other | Source: Ambulatory Visit | Attending: Surgery | Admitting: Surgery

## 2013-02-16 DIAGNOSIS — C50912 Malignant neoplasm of unspecified site of left female breast: Secondary | ICD-10-CM

## 2013-02-16 MED ORDER — GADOBENATE DIMEGLUMINE 529 MG/ML IV SOLN
20.0000 mL | Freq: Once | INTRAVENOUS | Status: AC | PRN
  Administered 2013-02-16: 20 mL via INTRAVENOUS

## 2013-02-18 ENCOUNTER — Telehealth (INDEPENDENT_AMBULATORY_CARE_PROVIDER_SITE_OTHER): Payer: Self-pay | Admitting: General Surgery

## 2013-02-18 NOTE — Telephone Encounter (Signed)
Dr Magnus Ivan message me back and stated that he did see Mrs Tami Schmidt MRI and he will talk to her oncology first then go from there

## 2013-02-21 ENCOUNTER — Other Ambulatory Visit: Payer: Self-pay | Admitting: *Deleted

## 2013-02-21 DIAGNOSIS — C50919 Malignant neoplasm of unspecified site of unspecified female breast: Secondary | ICD-10-CM | POA: Insufficient documentation

## 2013-02-21 DIAGNOSIS — C50912 Malignant neoplasm of unspecified site of left female breast: Secondary | ICD-10-CM

## 2013-03-01 ENCOUNTER — Encounter: Payer: Self-pay | Admitting: Oncology

## 2013-03-01 ENCOUNTER — Other Ambulatory Visit (HOSPITAL_BASED_OUTPATIENT_CLINIC_OR_DEPARTMENT_OTHER): Payer: Medicare Other | Admitting: Lab

## 2013-03-01 ENCOUNTER — Ambulatory Visit (HOSPITAL_BASED_OUTPATIENT_CLINIC_OR_DEPARTMENT_OTHER): Payer: Medicare Other

## 2013-03-01 ENCOUNTER — Ambulatory Visit (HOSPITAL_BASED_OUTPATIENT_CLINIC_OR_DEPARTMENT_OTHER): Payer: Medicare Other | Admitting: Oncology

## 2013-03-01 VITALS — BP 165/69 | HR 79 | Temp 97.9°F | Resp 18 | Ht 67.0 in | Wt 237.4 lb

## 2013-03-01 DIAGNOSIS — Z170 Estrogen receptor positive status [ER+]: Secondary | ICD-10-CM

## 2013-03-01 DIAGNOSIS — C50912 Malignant neoplasm of unspecified site of left female breast: Secondary | ICD-10-CM

## 2013-03-01 DIAGNOSIS — C50919 Malignant neoplasm of unspecified site of unspecified female breast: Secondary | ICD-10-CM

## 2013-03-01 DIAGNOSIS — M7989 Other specified soft tissue disorders: Secondary | ICD-10-CM

## 2013-03-01 LAB — COMPREHENSIVE METABOLIC PANEL (CC13)
ALT: 9 U/L (ref 0–55)
Albumin: 3.9 g/dL (ref 3.5–5.0)
Alkaline Phosphatase: 72 U/L (ref 40–150)
Anion Gap: 12 mEq/L — ABNORMAL HIGH (ref 3–11)
CO2: 25 mEq/L (ref 22–29)
Calcium: 9.9 mg/dL (ref 8.4–10.4)
Chloride: 106 mEq/L (ref 98–109)
Creatinine: 0.8 mg/dL (ref 0.6–1.1)
Potassium: 3.4 mEq/L — ABNORMAL LOW (ref 3.5–5.1)
Sodium: 142 mEq/L (ref 136–145)
Total Protein: 7.8 g/dL (ref 6.4–8.3)

## 2013-03-01 LAB — CBC WITH DIFFERENTIAL/PLATELET
BASO%: 0.9 % (ref 0.0–2.0)
EOS%: 4 % (ref 0.0–7.0)
LYMPH%: 29.1 % (ref 14.0–49.7)
MCHC: 31.7 g/dL (ref 31.5–36.0)
MONO#: 0.6 10*3/uL (ref 0.1–0.9)
NEUT#: 5.8 10*3/uL (ref 1.5–6.5)
RBC: 4.95 10*6/uL (ref 3.70–5.45)
WBC: 9.6 10*3/uL (ref 3.9–10.3)
lymph#: 2.8 10*3/uL (ref 0.9–3.3)

## 2013-03-01 MED ORDER — ANASTROZOLE 1 MG PO TABS: 1.0000 mg | ORAL_TABLET | Freq: Every day | ORAL | Status: DC

## 2013-03-01 NOTE — Progress Notes (Signed)
Checked in new patient with no financial issues. Has not been to [place] and he has his [facility] packet.

## 2013-03-01 NOTE — Progress Notes (Signed)
ID: Tami Schmidt OB: 08/28/1932  MR#: [id number]  NWG#:[id number]  PCP: Tami Parker, MD GYN:   SUAbigail Schmidt OTHER MD:  CHIEF COMPLAINT: "My breast cancers back".  HISTORY OF PRESENT ILLNESS: Tami Schmidt's history of breast cancer dates back to 1993, when she underwent biopsy of her right breast in [place]. I do not have those details, but she moved to [place] shortly thereafter and it was in [place] where she had her right lumpectomy and axillary lymph node dissection. She had a total of 6 lymph nodes removed, she tells me, and all of them were clear. She does not recall the size of the primary. She was treated with adjuvant radiation and then received tamoxifen between 1993 and 1998.  In 2002 she had a local recurrence in the right breast, and underwent right mastectomy. She received tamoxifen between 2002 and 2007.  In 2004 she underwent a left mastectomy and sentinel lymph node sampling for a 9 mm mucinous carcinoma, grade 2, multifocal, associated with ductal carcinoma in situ. The single sentinel lymph node was negative. Margins were ample. She did not receive adjuvant radiation, but continued on tamoxifen as noted above, until 2007.  More recently, in July of 2014, the patient noted a "pimple" in the left chest wall, superior to her prior mastectomy incision. She brought this to the attention of her surgeon, Dr. Magnus Schmidt and he describes a dimple area with a firm mass underneath. He also describes a large lipoma in the left clavicular area and a second one on her abdominal wall. Both of these were symptomatic. All 3 masses were removed 01/26/2013. The pathology from this procedure ([id number]) showed, in the chest wall, and invasive ductal carcinoma measuring grossly 1.3 cm. The lesion abutted the closest inked resection margin. The other 2 masses proved to be lipomas.  The patient's subsequent history is as detailed below  INTERVAL HISTORY: Tami Schmidt was evaluated in the  breast clinic 03/01/2013. Her husband Tami Schmidt came with her blood waited in the lobby during the patient's visit  REVIEW OF SYSTEMS: Aside from the chest wall issue, Tami Schmidt does not report major changes in her functional status, which is however somewhat limited. She does complain of night sweats. She needs glasses. She has frequent palpitations, which she tells me have been previously evaluated" are all right". She is short of breath when walking up stairs. She tells me her diabetes is "under control". She does her house work and some cooking but no regular exercise. She's not on any particular diet. A detailed review of systems today was otherwise noncontributory  PAST MEDICAL HISTORY: Past Medical History  Diagnosis Date  . Hypertension   . Cancer     breast  . Arthritis   . Breast cancer     b/l mastectomies hx  . Diabetes mellitus     fasting 90-100  . Hypercholesterolemia   . Hx of radiation therapy     breasts hx    PAST SURGICAL HISTORY: Past Surgical History  Procedure Laterality Date  . Abdominal hysterectomy    . Hernia repair  04-26-2010  . Breast surgery Bilateral     mastectomy  . Appendectomy    . Ankle surgery Right 1995  . Mass excision Left 01/26/2013    Procedure: EXCISION LEFT CHEST WALL MASS AND LEFT ABDOMNAL WALL MASS;  Surgeon: Tami Rubenstein, MD;  Location: [facility] OR;  Service: General;  Laterality: Left;    FAMILY HISTORY The patient's father died from a heart attack  at the age of 45. The patient's mother died at the age of 65 from myocarditis. The patient had no brothers, one sister. There is no history of breast or ovarian cancer in the family to her knowledge  GYNECOLOGIC HISTORY:  Menarche age 36, first live birth age 45, the patient is GX P5. She underwent hysterectomy in 1984. She did not use hormone replacement.  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 Her husband Architect used to work in Arts administrator but is now retired. Their children  are: Ambrose Pancoast who lives in [place] and teaches theater there; Kari Baars who works as a Veterinary surgeon in [place]; Product/process development scientist who lives in [place] and is a "radio personality"; Loraine Leriche, who is a Investment banker, operational in [place], and Casimiro Needle who is a musician living in [place]. The patient has 2 grandchildren. She attends a [employer]    ADVANCED DIRECTIVES: Not in place   HEALTH MAINTENANCE: History  Substance Use Topics  . Smoking status: Former Smoker    Types: Cigarettes    Quit date: 04/14/1972  . Smokeless tobacco: Never Used  . Alcohol Use: Yes     Comment: occasional     Colonoscopy: 2011  PAP: Status post hysterectomy  Bone density: 1993  Lipid panel:  Allergies  Allergen Reactions  . Bactrim Other (See Comments)    Swelling to the mouth and face.  . Lisinopril Other (See Comments)    Swelling to the mouth and face.  Tami Schmidt Other (See Comments)    Swelling to the mouth and face.    Current Outpatient Prescriptions  Medication Sig Dispense Refill  . amLODipine (NORVASC) 10 MG tablet Take 10 mg by mouth daily.      . Choline Fenofibrate (FENOFIBRIC ACID) 135 MG CPDR Take 1 tablet by mouth daily.       . fish oil-omega-3 fatty acids 1000 MG capsule Take 2 g by mouth daily.      . Insulin Isophane Human (HUMULIN N [place]) Inject 20-40 Units into the skin every morning. 40 units in the am and 20 units in the pm      . losartan-hydrochlorothiazide (HYZAAR) 100-25 MG per tablet Take 1 tablet by mouth daily.       . metFORMIN (GLUCOPHAGE) 500 MG tablet Take 500 mg by mouth daily with breakfast.      . HYDROcodone-acetaminophen (NORCO) 5-325 MG per tablet Take 1-2 tablets by mouth every 4 (four) hours as needed for pain.  40 tablet  0   No current facility-administered medications for this visit.    OBJECTIVE: Older African American woman who appears chronically Filed Vitals:   03/01/13 1607  BP: 165/69  Pulse: 79  Temp: 97.9 F (36.6 C)  Resp: 18     Body mass  index is 37.17 kg/(m^2).    ECOG FS:1 - Symptomatic but completely ambulatory  Ocular: Sclerae unicteric, pupils equal, arcus senilis bilaterally Ear-nose-throat: Oropharynx clear and moist Lymphatic: No cervical or supraclavicular adenopathy; there is palpable left axillary adenopathy, the largest lymph node measuring approximately 2 cm.; The right axilla is clear Lungs no rales or rhonchi, good excursion bilaterally Heart regular rate and rhythm, no murmur appreciated Abd soft, markedly obese, nontender, positive bowel sounds MSK no focal spinal tenderness, grade 1 left upper extremity lymphedema Neuro: non-focal, well-oriented, appropriate affect Breasts: Status post bilateral mastectomies. The left chest wall incision is healing very nicely, with no subjacent mass, dehiscence care at erythema, or swelling.   LAB RESULTS:  CMP     Component Value Date/Time   NA 142 03/01/2013 1556   NA 139 01/21/2013 1028   K 3.4* 03/01/2013 1556   K 3.6 01/21/2013 1028   CL 101 01/21/2013 1028   CO2 25 03/01/2013 1556   CO2 24 01/21/2013 1028   GLUCOSE 93 03/01/2013 1556   GLUCOSE 208* 01/21/2013 1028   BUN 15.3 03/01/2013 1556   BUN 16 01/21/2013 1028   CREATININE 0.8 03/01/2013 1556   CREATININE 0.77 01/21/2013 1028   CREATININE 0.74 06/14/2012 1257   CALCIUM 9.9 03/01/2013 1556   CALCIUM 9.2 01/21/2013 1028   PROT 7.8 03/01/2013 1556   PROT 7.8 04/20/2010 0942   ALBUMIN 3.9 03/01/2013 1556   ALBUMIN 4.4 04/20/2010 0942   AST 14 03/01/2013 1556   AST 21 04/20/2010 0942   ALT 9 03/01/2013 1556   ALT 18 04/20/2010 0942   ALKPHOS 72 03/01/2013 1556   ALKPHOS 86 04/20/2010 0942   BILITOT 0.40 03/01/2013 1556   BILITOT 0.5 04/20/2010 0942   GFRNONAA 78* 01/21/2013 1028   GFRAA >90 01/21/2013 1028    I No results found for this basename: SPEP, UPEP,  kappa and lambda light chains    Lab Results  Component Value Date   WBC 9.6 03/01/2013   NEUTROABS 5.8 03/01/2013   HGB 12.3 03/01/2013    HCT 38.7 03/01/2013   MCV 78.1* 03/01/2013   PLT 215 03/01/2013      Chemistry      Component Value Date/Time   NA 142 03/01/2013 1556   NA 139 01/21/2013 1028   K 3.4* 03/01/2013 1556   K 3.6 01/21/2013 1028   CL 101 01/21/2013 1028   CO2 25 03/01/2013 1556   CO2 24 01/21/2013 1028   BUN 15.3 03/01/2013 1556   BUN 16 01/21/2013 1028   CREATININE 0.8 03/01/2013 1556   CREATININE 0.77 01/21/2013 1028   CREATININE 0.74 06/14/2012 1257      Component Value Date/Time   CALCIUM 9.9 03/01/2013 1556   CALCIUM 9.2 01/21/2013 1028   ALKPHOS 72 03/01/2013 1556   ALKPHOS 86 04/20/2010 0942   AST 14 03/01/2013 1556   AST 21 04/20/2010 0942   ALT 9 03/01/2013 1556   ALT 18 04/20/2010 0942   BILITOT 0.40 03/01/2013 1556   BILITOT 0.5 04/20/2010 0942       No results found for this basename: LABCA2    No components found with this basename: LABCA125    No results found for this basename: INR,  in the last 168 hours  Urinalysis    Component Value Date/Time   COLORURINE YELLOW 04/26/2010 0840   APPEARANCEUR HAZY* 04/26/2010 0840   LABSPEC 1.017 04/26/2010 0840   PHURINE 7.5 04/26/2010 0840   HGBUR NEGATIVE 04/26/2010 0840   BILIRUBINUR NEGATIVE 04/26/2010 0840   KETONESUR NEGATIVE 04/26/2010 0840   PROTEINUR NEGATIVE 04/26/2010 0840   UROBILINOGEN 0.2 04/26/2010 0840   NITRITE NEGATIVE 04/26/2010 0840   LEUKOCYTESUR SMALL* 04/26/2010 0840    STUDIES: Mr Breast Bilateral W Wo Contrast  02/17/2013   CLINICAL DATA:  Patient with bilateral mastectomies now with recent resection of recurrent carcinoma in the left anterior chest wall.  EXAM: MR BILATERAL BREAST WITHOUT AND WITH CONTRAST  TECHNIQUE: Multiplanar, multisequence MR images of both breasts were obtained prior to and following the intravenous administration of 20ml of MultiHance.  THREE-DIMENSIONAL MR IMAGE RENDERING ON INDEPENDENT WORKSTATION:  Three-dimensional MR images were rendered by post-processing of the original MR data on  an  independent workstation. The three-dimensional MR images were interpreted, and findings are reported in the following complete MRI report for this study.  COMPARISON:  Previous exams  FINDINGS: Status post bilateral mastectomy. Within the left anterior chest wall there is a 4.2 x 3.3 cm resection cavity with surrounding rim enhancement. This resection cavity abuts the pectoralis muscle along the posterior margin. No definite additional areas of abnormal enhancement are identified within the left anterior chest wall.  Lymph nodes: There is extensive bulky left axillary and subpectoral lymphadenopathy with a reference left axillary lymph node measuring 6 x 2.5 cm and a reference left subpectoral lymph node measuring 6.4 x 4.1 cm.  Ancillary findings:  None.  IMPRESSION: 1. Postsurgical cavity within the left chest wall compatible with resection of recurrent left chest wall carcinoma. The rim enhancement along the posterior margin of the postsurgical cavity immediately abuts the adjacent pectoralis muscle and pectoralis muscle invasion is not excluded. 2. Extensive bulky left axillary and subpectoral lymphadenopathy. A second-look ultrasound and ultrasound-guided core needle biopsy of the enlarged left axillary lymph nodes can be performed as clinically indicated.  RECOMMENDATION: Recommend correlation with pathology for margin status. Additionally the patient can be brought in for second look ultrasound and ultrasound-guided core needle biopsy of the enlarged left axillary lymph nodes as clinically indicated.  BI-RADS CATEGORY  4: Suspicious abnormality - biopsy should be considered.   Electronically Signed   By: Annia Belt M.D.   On: 02/17/2013 14:51    ASSESSMENT: 79 y.o.  woman  (1) status post right lumpectomy and axillary dissection in 1993 in [place] for what appears to have been a stage I breast cancer, treated with radiation and then tamoxifen for 5 years  (2) status post right mastectomy  2002 for a recurrence in the right breast, treated adjuvantly with tamoxifen for an additional 5 years  (3) status post left mastectomy and sentinel lymph node sampling 03/07/2003 for a pT1b, pN0, stage IA mucinous breast cancer, grade 2; no radiation therapy was given. Tamoxifen was continued until 2009  (4) excisional biopsy of a left chest wall mass 01/26/2013 showing carcinoma consistent with invasive ductal carcinoma, estrogen receptor 100% positive, progesterone receptor 0% positive, with an MIB-1 of 22% and no HER-2 amplification  PLAN: We spent the better part of today's hour-long appointment discussing the biology of breast cancer in general, and the specifics of the patient's tumor in particular. Step 1 I believe is full restaging, and I have set Kambri up for a PET scan. She will need radiation to the chest wall and we also need to consider surgery and/or radiation to the left axilla versus observation. Those issues will be discussed at the multidisciplinary breast cancer conference next week.  In the meantime I have started her on anastrozole. We discussed the possible toxicities, side effects, and complications of this medication. If there is no other site of disease, then the left axillary adenopathy, although it has not been biopsied, can serve as measurable disease to assess response.  She has a new left upper extremity lymphedema. I have referred her to the lymphedema clinic, but she may need radiation or surgery to the left axilla to help with this problem.  Tami Schmidt has a very good understanding of the overall plan. At this point the purpose of treatment is control. Her overall health is not good, and I am hopeful we will be able to control her disease with antiestrogen therapy for a significant period of time before having  to consider more aggressive treatment. She is going to see [place] again December 12, by which time we will have results of the PET and also the multidisciplinary  discussion of her situation. She knows to call for any problems that may develop before that visit.  Lowella Dell, MD   03/01/2013 5:01 PM

## 2013-03-02 ENCOUNTER — Telehealth: Payer: Self-pay | Admitting: Oncology

## 2013-03-02 ENCOUNTER — Encounter: Payer: Self-pay | Admitting: *Deleted

## 2013-03-02 NOTE — Telephone Encounter (Signed)
Appts made per 11/18 GM POF Cal mailed to pt [facility]

## 2013-03-02 NOTE — Progress Notes (Signed)
Mailed after appt letter to pt. 

## 2013-03-03 ENCOUNTER — Ambulatory Visit (HOSPITAL_COMMUNITY)
Admission: RE | Admit: 2013-03-03 | Discharge: 2013-03-03 | Disposition: A | Discharge status: Home / Self Care | Payer: Medicare Other | Source: Ambulatory Visit | Attending: Oncology | Admitting: Oncology

## 2013-03-03 DIAGNOSIS — K439 Ventral hernia without obstruction or gangrene: Secondary | ICD-10-CM | POA: Insufficient documentation

## 2013-03-03 DIAGNOSIS — I2510 Atherosclerotic heart disease of native coronary artery without angina pectoris: Secondary | ICD-10-CM | POA: Insufficient documentation

## 2013-03-03 DIAGNOSIS — C50919 Malignant neoplasm of unspecified site of unspecified female breast: Secondary | ICD-10-CM | POA: Insufficient documentation

## 2013-03-03 DIAGNOSIS — C50912 Malignant neoplasm of unspecified site of left female breast: Secondary | ICD-10-CM

## 2013-03-03 DIAGNOSIS — K449 Diaphragmatic hernia without obstruction or gangrene: Secondary | ICD-10-CM | POA: Insufficient documentation

## 2013-03-03 DIAGNOSIS — I7090 Unspecified atherosclerosis: Secondary | ICD-10-CM | POA: Insufficient documentation

## 2013-03-03 DIAGNOSIS — C778 Secondary and unspecified malignant neoplasm of lymph nodes of multiple regions: Secondary | ICD-10-CM | POA: Insufficient documentation

## 2013-03-03 LAB — GLUCOSE, CAPILLARY: Glucose-Capillary: 111 mg/dL — ABNORMAL HIGH (ref 70–99)

## 2013-03-03 MED ORDER — FLUDEOXYGLUCOSE F - 18 (FDG) INJECTION
18.9000 | Freq: Once | INTRAVENOUS | Status: AC | PRN
  Administered 2013-03-03: 18.9 via INTRAVENOUS

## 2013-03-08 ENCOUNTER — Ambulatory Visit: Payer: Medicare Other | Attending: Oncology | Admitting: Physical Therapy

## 2013-03-08 DIAGNOSIS — M24519 Contracture, unspecified shoulder: Secondary | ICD-10-CM | POA: Insufficient documentation

## 2013-03-08 DIAGNOSIS — IMO0001 Reserved for inherently not codable concepts without codable children: Secondary | ICD-10-CM | POA: Insufficient documentation

## 2013-03-08 DIAGNOSIS — I890 Lymphedema, not elsewhere classified: Secondary | ICD-10-CM | POA: Insufficient documentation

## 2013-03-09 ENCOUNTER — Ambulatory Visit (HOSPITAL_COMMUNITY): Payer: Medicare Other

## 2013-03-09 ENCOUNTER — Ambulatory Visit: Payer: Medicare Other

## 2013-03-15 ENCOUNTER — Ambulatory Visit: Payer: Medicare Other | Attending: Oncology

## 2013-03-15 DIAGNOSIS — IMO0001 Reserved for inherently not codable concepts without codable children: Secondary | ICD-10-CM | POA: Insufficient documentation

## 2013-03-15 DIAGNOSIS — I890 Lymphedema, not elsewhere classified: Secondary | ICD-10-CM | POA: Insufficient documentation

## 2013-03-15 DIAGNOSIS — M24519 Contracture, unspecified shoulder: Secondary | ICD-10-CM | POA: Insufficient documentation

## 2013-03-16 ENCOUNTER — Encounter: Payer: Self-pay | Admitting: Radiation Oncology

## 2013-03-17 ENCOUNTER — Ambulatory Visit: Payer: Medicare Other | Admitting: Physical Therapy

## 2013-03-18 ENCOUNTER — Telehealth: Payer: Self-pay | Admitting: *Deleted

## 2013-03-18 NOTE — Telephone Encounter (Signed)
CALLED PATIENT TO INFORM OF [facility] APPT. PER DR. MOODY, LVM FOR A RETURN CALL

## 2013-03-22 ENCOUNTER — Ambulatory Visit: Payer: Medicare Other

## 2013-03-22 NOTE — Progress Notes (Signed)
Follow up New consult  Location of Breast Cancer: LEFT chest wall Breast cancer Dx 1993 B/l mastectomies ,  Histology per Pathology Report: 01/26/13: Diagnosis1. Soft tissue mass, simple excision, Left abdominal wall- MATURE ADIPOSE TISSUE, CONSISTENT WITH LIPOMA.2. Soft tissue mass, simple excision, Left clavicle  - MATURE ADIPOSE TISSUE, CONSISTENT WITH LIPOMA.3. Soft tissue mass, simple excision, Left chest wall- INVASIVE CARCINOMA.- CARCINOMA IS LESS THAN 0.1 CM FROM THE NEAREST NON-ORIENTED TISSUE MARGIN.  Receptor Status: ER(+), PR (-), Her2-neu (-)  Did patient present with symptoms (if so, please note symptoms) or was this found on screening mammography?:patient found mass on left chest wall, That dimpled about 3 months ago,had been there for years as well as abdominal mass  Past/Anticipated interventions by surgeon, if any: 01/26/13:Procedure(s): EXCISION LEFT CHEST WALL MASS X2 (5cm and 2 cm) AND LEFT ABDOMNAL WALL MASS(10 cm) [name], MD  Past/Anticipated interventions by medical oncology, if any: Chemotherapy ,referral to Lymphedema clinic may need rad tx to left axilla Lymphedema issues, if any: Yes slight left arm , Lymphedema clinic 03/22/13 seen Pain issues, if any: no  SAFETY ISSUES:no  Prior radiation?yes 1993 right breast only with Dr. Rebbeca Paul, [place],  Pacemaker/ICD? no Possible current pregnancy?no  Is the patient on methotrexate? no Current Complaints / other details: 1993 b/l mastectomies [facility] [place]  Records from [place] in chart,  [name], [name], RN

## 2013-03-23 ENCOUNTER — Encounter: Payer: Self-pay | Admitting: Radiation Oncology

## 2013-03-23 ENCOUNTER — Ambulatory Visit
Admission: RE | Admit: 2013-03-23 | Discharge: 2013-03-23 | Disposition: A | Discharge status: Home / Self Care | Payer: Medicare Other | Source: Ambulatory Visit | Attending: Radiation Oncology | Admitting: Radiation Oncology

## 2013-03-23 VITALS — BP 178/84 | HR 87 | Temp 98.3°F | Resp 22 | Ht 67.0 in | Wt 234.1 lb

## 2013-03-23 DIAGNOSIS — C50919 Malignant neoplasm of unspecified site of unspecified female breast: Secondary | ICD-10-CM | POA: Insufficient documentation

## 2013-03-23 DIAGNOSIS — Z510 Encounter for antineoplastic radiation therapy: Secondary | ICD-10-CM | POA: Insufficient documentation

## 2013-03-23 DIAGNOSIS — L988 Other specified disorders of the skin and subcutaneous tissue: Secondary | ICD-10-CM | POA: Insufficient documentation

## 2013-03-23 DIAGNOSIS — Z79899 Other long term (current) drug therapy: Secondary | ICD-10-CM | POA: Insufficient documentation

## 2013-03-23 DIAGNOSIS — Y842 Radiological procedure and radiotherapy as the cause of abnormal reaction of the patient, or of later complication, without mention of misadventure at the time of the procedure: Secondary | ICD-10-CM | POA: Insufficient documentation

## 2013-03-23 DIAGNOSIS — C50912 Malignant neoplasm of unspecified site of left female breast: Secondary | ICD-10-CM

## 2013-03-23 DIAGNOSIS — Z794 Long term (current) use of insulin: Secondary | ICD-10-CM | POA: Insufficient documentation

## 2013-03-23 NOTE — Progress Notes (Signed)
Radiation Oncology         (336) [phone] ________________________________  Name: Tami Schmidt MRN: [id number]  Date: 03/23/2013  DOB: 05-16-1932  Follow-Up Visit Note  CC: Ron Parker, MD  [name], MD  Diagnosis:   Recurrent breast cancer within the left chest wall/supraclavicular region. The patient also has a history of right-sided breast cancer.   Narrative:  The patient returns today for routine follow-up.  The patient has seen medical oncology since she was initially seen. She has been started on and.hormonal treatment once again. She has taken tamoxifen for a number of years previously.  The patient's restaging workup since she was last seen has included a PET scan completed on 03/03/2013. Unfortunately this showed significant hypermetabolic disease extensively in the left subpectoral and left axillary regions and the left supraclavicular region as well. The patient's case has been discussed in multidisciplinary breast conference and I have been asked to see the patient again today for consideration of radiation treatment.                             ALLERGIES:  is allergic to bactrim; lisinopril; and vasotec.  Meds: Current Outpatient Prescriptions  Medication Sig Dispense Refill  . amLODipine (NORVASC) 10 MG tablet Take 10 mg by mouth daily.      Marland Kitchen anastrozole (ARIMIDEX) 1 MG tablet Take 1 tablet (1 mg total) by mouth daily.  90 tablet  12  . Choline Fenofibrate (FENOFIBRIC ACID) 135 MG CPDR Take 1 tablet by mouth daily.       . fish oil-omega-3 fatty acids 1000 MG capsule Take 2 g by mouth daily.      . Insulin Isophane Human (HUMULIN N [place]) Inject 20-40 Units into the skin every morning. 40 units in the am and 20 units in the pm      . losartan-hydrochlorothiazide (HYZAAR) 100-25 MG per tablet Take 1 tablet by mouth daily.       . metFORMIN (GLUCOPHAGE) 500 MG tablet Take 500 mg by mouth daily with breakfast.       No current facility-administered  medications for this encounter.    Physical Findings: The patient is in no acute distress. Patient is alert and oriented.  vitals were not taken for this visit..   No nodularity within the chest wall in the region of the mastectomy scar. The medial aspect of the scar was the site of recent excision but no nodularity remaining in this area. The patient has firmness in the upper chest wall/supraclavicular region. There is a surgical scar over this area which was the site of a recent excision as well which returned positive for a lipoma. This underlying area is now quite firm.  Lab Findings: Lab Results  Component Value Date   WBC 9.6 03/01/2013   HGB 12.3 03/01/2013   HCT 38.7 03/01/2013   MCV 78.1* 03/01/2013   PLT 215 03/01/2013     Radiographic Findings: Nm Pet Image Initial (pi) Skull Base To Thigh  03/03/2013   CLINICAL DATA:  Initial treatment strategy for breast cancer.  EXAM: NUCLEAR MEDICINE PET SKULL BASE TO THIGH  FASTING BLOOD GLUCOSE:  Value: 111mg /dl  TECHNIQUE: 47.8 mCi G-95 FDG was injected intravenously. CT data was obtained and used for attenuation correction and anatomic localization only. (This was not acquired as a diagnostic CT examination.) Additional exam technical data entered on technologist worksheet.  COMPARISON:  MR breast 02/16/2013 and CT abdomen  pelvis 06/16/2012.  FINDINGS: NECK  No hypermetabolic lymph nodes in the neck. CT images show no acute findings. Visualized portions of the paranasal sinuses and mastoid air cells are clear.  CHEST  Left supraclavicular adenopathy measures 11 mm (CT image 45), with an SUV max of 6.4. Extensive left subpectoral and left axillary adenopathy measures up to 3.3 x 5.4 cm deep to left pectoralis musculature, with an SUV max of 8.1.  A focal low attenuation collection in the medial right breast measures 1.7 x 2.8 cm (CT image 77) with an SUV max of 6.0. No additional areas of abnormal hypermetabolism in the chest.  CT images show  atherosclerotic calcification of the arterial vasculature, including three-vessel involvement of the coronary arteries. Heart is enlarged. No pericardial effusion. Tiny hiatal hernia. Lungs are grossly clear. No pleural fluid.  ABDOMEN/PELVIS  No abnormal hypermetabolic activity within the liver, pancreas, adrenal glands or spleen. No hypermetabolic lymph nodes.  CT images show the liver, gallbladder, adrenal glands, kidneys, spleen, pancreas and stomach to be grossly unremarkable. There is a low ventral pelvic wall hernia containing unobstructed small bowel and colon. Bowel is otherwise grossly unremarkable. Atherosclerotic calcification of the arterial vasculature without abdominal aortic aneurysm. No free fluid.  SKELETON  No focal hypermetabolic activity to suggest skeletal metastasis. Sclerotic foci in the right femur and left iliac wing are unchanged from 04/20/2010. Extensive degenerative change in the spine.  IMPRESSION: 1. Metastatic left axillary, left subpectoral and left supraclavicular adenopathy. 2. Postoperative changes in the anterior left chest wall with associated mild hypermetabolism. 3. Low ventral pelvic wall hernia containing unobstructed small bowel and colon.   Electronically Signed   By: Leanna Battles M.D.   On: 03/03/2013 14:08    Impression:    The patient unfortunately has significant evidence of CHOP the disease in the left chest wall/supraclavicular region as well as in the left axilla. Her case has been discussed at multidisciplinary breast conference. She has started anti-hormonal treatment and I believe that she is an appropriate candidate for palliative radiation treatment at this time. She has not received any radiation treatment to the left chest wall and she reiterated this today once again. During conference, it was discussed that the patient is not a candidate for surgical resection.  I discussed with the patient therefore the potential benefit of a course of radiation  treatment in this setting. We discussed the limitations of this treatment as well in terms of permanently eradicate any such disease and she is presenting with right now. We also discussed the possible side effects and risks of treatment. All of her questions were answered.  Plan:  The patient will proceed with a simulation later today such that we can proceed with treatment planning.  I spent 20 minutes with the patient today, the majority of which was spent counseling the patient on the diagnosis of cancer and coordinating care.   Radene Gunning, M.D., Ph.D.

## 2013-03-23 NOTE — Progress Notes (Signed)
Please see the Nurse Progress Note in the MD Initial Consult Encounter for this patient. 

## 2013-03-24 ENCOUNTER — Other Ambulatory Visit: Payer: Self-pay | Admitting: Physician Assistant

## 2013-03-24 ENCOUNTER — Other Ambulatory Visit: Payer: Self-pay | Admitting: Radiation Oncology

## 2013-03-24 ENCOUNTER — Ambulatory Visit: Payer: Medicare Other | Admitting: Radiation Oncology

## 2013-03-24 DIAGNOSIS — C50919 Malignant neoplasm of unspecified site of unspecified female breast: Secondary | ICD-10-CM

## 2013-03-25 ENCOUNTER — Encounter: Payer: Self-pay | Admitting: Physician Assistant

## 2013-03-25 ENCOUNTER — Telehealth: Payer: Self-pay | Admitting: *Deleted

## 2013-03-25 ENCOUNTER — Other Ambulatory Visit (HOSPITAL_BASED_OUTPATIENT_CLINIC_OR_DEPARTMENT_OTHER): Payer: Medicare Other

## 2013-03-25 ENCOUNTER — Ambulatory Visit (HOSPITAL_BASED_OUTPATIENT_CLINIC_OR_DEPARTMENT_OTHER): Payer: Medicare Other | Admitting: Physician Assistant

## 2013-03-25 VITALS — BP 182/76 | HR 87 | Temp 98.6°F | Resp 18 | Ht 67.0 in | Wt 233.7 lb

## 2013-03-25 DIAGNOSIS — C778 Secondary and unspecified malignant neoplasm of lymph nodes of multiple regions: Secondary | ICD-10-CM

## 2013-03-25 DIAGNOSIS — C779 Secondary and unspecified malignant neoplasm of lymph node, unspecified: Secondary | ICD-10-CM

## 2013-03-25 DIAGNOSIS — C50919 Malignant neoplasm of unspecified site of unspecified female breast: Secondary | ICD-10-CM

## 2013-03-25 DIAGNOSIS — C50912 Malignant neoplasm of unspecified site of left female breast: Secondary | ICD-10-CM

## 2013-03-25 DIAGNOSIS — I10 Essential (primary) hypertension: Secondary | ICD-10-CM | POA: Insufficient documentation

## 2013-03-25 DIAGNOSIS — E119 Type 2 diabetes mellitus without complications: Secondary | ICD-10-CM | POA: Insufficient documentation

## 2013-03-25 HISTORY — DX: Type 2 diabetes mellitus without complications: E11.9

## 2013-03-25 HISTORY — DX: Essential (primary) hypertension: I10

## 2013-03-25 HISTORY — DX: Secondary and unspecified malignant neoplasm of lymph node, unspecified: C77.9

## 2013-03-25 LAB — CBC WITH DIFFERENTIAL/PLATELET
Basophils Absolute: 0.1 10*3/uL (ref 0.0–0.1)
EOS%: 5.7 % (ref 0.0–7.0)
Eosinophils Absolute: 0.5 10*3/uL (ref 0.0–0.5)
HCT: 38.4 % (ref 34.8–46.6)
HGB: 12.4 g/dL (ref 11.6–15.9)
MCH: 25.4 pg (ref 25.1–34.0)
MCV: 78.7 fL — ABNORMAL LOW (ref 79.5–101.0)
MONO%: 6.5 % (ref 0.0–14.0)
NEUT%: 64.5 % (ref 38.4–76.8)
lymph#: 2 10*3/uL (ref 0.9–3.3)

## 2013-03-25 LAB — COMPREHENSIVE METABOLIC PANEL (CC13)
AST: 14 U/L (ref 5–34)
Anion Gap: 12 mEq/L — ABNORMAL HIGH (ref 3–11)
BUN: 15 mg/dL (ref 7.0–26.0)
Calcium: 9.7 mg/dL (ref 8.4–10.4)
Chloride: 104 mEq/L (ref 98–109)
Creatinine: 0.9 mg/dL (ref 0.6–1.1)
Glucose: 200 mg/dl — ABNORMAL HIGH (ref 70–140)
Total Bilirubin: 0.45 mg/dL (ref 0.20–1.20)

## 2013-03-25 NOTE — Progress Notes (Addendum)
  Radiation Oncology         (336) [phone] ________________________________  Name: PERSAIS ETHRIDGE MRN: [id number]  Date: 03/23/2013  DOB: December 07, 1932   SIMULATION AND TREATMENT PLANNING NOTE  The patient presented for simulation prior to beginning her course of radiation treatment for her diagnosis of left-sided breast cancer. The patient was placed in a supine position on a breast board. A customized accuform device was also constructed and this complex treatment device will be used on a daily basis during her treatment. In this fashion, a CT scan was obtained through the chest area and an isocenter was placed near the chest wall at the upper aspect of the right chest.  The patient will be planned to receive a course of radiation initially to a dose of 50.4 gray. This will consist of a forward treatment planning technique tod  the chest wall as well as  a 2 field technique to the supraclavicular region. This treatment will be accomplished at 1.8 gray per fraction. This will represent a 3-D conformal technique. Dose volume histograms of the target area/scar as well as the heart and lungs will be generated and reviewed.  It is anticipated that the patient will then receive a 10-14 gray boost. This will be accomplished at 2 gray per fraction. The final anticipated total dose therefore will correspond to 60.4 gray.    ------------------------------------------------  Radene Gunning, MD, PhD

## 2013-03-25 NOTE — Telephone Encounter (Signed)
appts made and printed...td 

## 2013-03-25 NOTE — Progress Notes (Signed)
ID: Tami Schmidt OB: [birth year]  MR#: [id number]  WUJ#:[id number]  PCP: Tami Parker, MD GYN:   SUAbigail Miyamoto, MD OTHER MD: Tami Puffer, MD  CHIEF COMPLAINT:  Recurrent breast cancer/left chest wall, with metastatic left axillary, left subpectoral, and left supraclavicular adenopathy   HISTORY OF PRESENT ILLNESS: Tami Schmidt's history of breast cancer dates back to 1993, when she underwent biopsy of her right breast in [place]. I do not have those details, but she moved to [place] shortly thereafter and it was in [place] where she had her right lumpectomy and axillary lymph node dissection. She had a total of 6 lymph nodes removed, she tells me, and all of them were clear. She does not recall the size of the primary. She was treated with adjuvant radiation and then received tamoxifen between 1993 and 1998.  In 2002 she had a local recurrence in the right breast, and underwent right mastectomy. She received tamoxifen between 2002 and 2007.  In 2004 she underwent a left mastectomy and sentinel lymph node sampling for a 9 mm mucinous carcinoma, grade 2, multifocal, associated with ductal carcinoma in situ. The single sentinel lymph node was negative. Margins were ample. She did not receive adjuvant radiation, but continued on tamoxifen as noted above, until 2007.  More recently, in July of 2014, the patient noted a "pimple" in the left chest wall, superior to her prior mastectomy incision. She brought this to the attention of her surgeon, Dr. Magnus Schmidt and he describes a dimple area with a firm mass underneath. He also describes a large lipoma in the left clavicular area and a second one on her abdominal wall. Both of these were symptomatic. All 3 masses were removed 01/26/2013. The pathology from this procedure (SZA [id number] [phone]) showed, in the chest wall, and invasive ductal carcinoma measuring grossly 1.3 cm. The lesion abutted the closest inked resection margin. The other 2 masses proved to be  lipomas.  The patient's subsequent history is as detailed below  INTERVAL HISTORY: Tami Schmidt returns alone today for followup of her recurrent breast cancer on the left side. Since her appointment here on November 18, she has had a staging PET scan, and has also met with Dr. Mitzi Schmidt to discuss radiation therapy. The PET scan showed significant left axillary and left supraclavicular adenopathy, with extensive adenopathy deep to the left pectoralis musculature. Results of the PET scan are all detailed below. Fortunately, there did not appear to be any tony involvement, or involvement in the liver or lungs.   Tami Schmidt case was presented at the multidisciplinary breast conference, and it was determined that she is not a candidate for surgical resection. The decision was made to proceed with radiation therapy. At this time, she also continues on anastrozole which she started in mid November 2014. She is tolerating the drug well.  REVIEW OF SYSTEMS: Tami Schmidt tells me she is "doing fine". She denies any recent illnesses and has had no fevers or chills. She denies hot flashes. She's had no vaginal changes, specifically no dryness, discharge, or abnormal bleeding. She admits that she does not exercise on a regular basis, although she tells me her energy level is good. She's had no nausea or change in bowel or bladder habits. She has no recent cough, phlegm production, chest pain or pressure. She has occasional palpitations which are stable. She has shortness of breath with exertion which is stable. She's had no abnormal headaches, dizziness, or change in vision. She currently denies any myalgias, arthralgias, or bony pain.  She has slight swelling in the left upper extremity, but no additional swelling elsewhere.  A detailed review of systems is otherwise stable and noncontributory.  PAST MEDICAL HISTORY: Past Medical History  Diagnosis Date  . Hypertension   . Cancer     breast  . Arthritis   . Breast cancer      b/l mastectomies hx  . Diabetes mellitus     fasting 90-100  . Hypercholesterolemia   . Hx of radiation therapy     breasts hx  . Type II or unspecified type diabetes mellitus without mention of complication, not stated as uncontrolled 03/25/2013  . HTN (hypertension) 03/25/2013  . 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 Abdominal hysterectomy    . Hernia repair  04-26-2010  . Breast surgery Bilateral     mastectomy  . Appendectomy    . Ankle surgery Right 1995  . Mass excision Left 01/26/2013    Procedure: EXCISION LEFT CHEST WALL MASS AND LEFT ABDOMNAL WALL MASS;  Surgeon: Tami Rubenstein, MD;  Location: [facility] OR;  Service: General;  Laterality: Left;    FAMILY HISTORY The patient's father died from a heart attack at the age of 83. The patient's mother died at the age of 27 from myocarditis. The patient had no brothers, one sister. There is no history of breast or ovarian cancer in the family to her knowledge  GYNECOLOGIC HISTORY:  Menarche age 84, first live birth age 75, the patient is GX P5. She underwent hysterectomy in 1984. She did not use hormone replacement.  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 Her husband Architect used to work in Arts administrator but is now retired. Their children are: Tami Schmidt who lives in [place] and teaches theater there; Tami Schmidt who works as a Veterinary surgeon in [place]; Product/process development scientist who lives in [place] and is a "radio personality"; Tami Schmidt, who is a Investment banker, operational in [place], and Tami Schmidt who is a musician living in [place]. The patient has 2 grandchildren. She attends a [employer]    ADVANCED DIRECTIVES: Not in place   HEALTH MAINTENANCE:  (Updated 03/25/2013) History  Substance Use Topics  . Smoking status: Former Smoker    Types: Cigarettes    Quit date: 04/14/1972  . Smokeless tobacco: Never Used  . Alcohol Use: Yes     Comment:  occasional    Colonoscopy: 2011  PAP: Status post hysterectomy  Bone density: 1993  Lipid panel: not on file/Dr. Lovell Sheehan  Allergies  Allergen Reactions  . Bactrim Other (See Comments)    Swelling to the mouth and face.  . Lisinopril Other (See Comments)    Swelling to the mouth and face.  Tami Schmidt Other (See Comments)    Swelling to the mouth and face.    Current Outpatient Prescriptions  Medication Sig Dispense Refill  . amLODipine (NORVASC) 10 MG tablet Take 10 mg by mouth daily.      Marland Kitchen anastrozole (ARIMIDEX) 1 MG tablet Take 1 tablet (1 mg total) by mouth daily.  90 tablet  12  . Choline Fenofibrate (FENOFIBRIC ACID) 135 MG CPDR Take 1 tablet by mouth daily.       . fish oil-omega-3 fatty acids 1000 MG capsule Take 2 g by mouth daily.      . Insulin Isophane Human (HUMULIN N [place]) Inject 20-40 Units into the skin every morning. 40 units in the am and  20 units in the pm      . losartan-hydrochlorothiazide (HYZAAR) 100-25 MG per tablet Take 1 tablet by mouth daily.       . metFORMIN (GLUCOPHAGE) 500 MG tablet Take 500 mg by mouth daily with breakfast.       No current facility-administered medications for this visit.    OBJECTIVE: Older African American woman who appears comfortable and is in no acute distress Filed Vitals:   03/25/13 0931  BP: 182/76  Pulse: 87  Temp: 98.6 F (37 C)  Resp: 18     Body mass index is 36.59 kg/(m^2).    ECOG FS: 0 Filed Weights   03/25/13 0931  Weight: 233 lb 11.2 oz (106.006 kg)   Physical Exam: HEENT:  Sclerae anicteric.  Oropharynx clear and moist.  NODES:  No cervical, supraclavicular, or axillary lymphadenopathy palpated on the right. There is a firm area palpated in the left supraclavicular region. There is also palpable lymphadenopathy in the left axilla, firm but movable. BREAST EXAM:  Patient status post bilateral mastectomies. No nodularity was palpated on either the right or the left. No obvious skin changes or skin  lesions. LUNGS:  Clear to auscultation bilaterally.  No wheezes or rhonchi HEART:  Regular rate and rhythm. No murmur appreciated ABDOMEN:  Soft, obese, nontender.  Positive bowel sounds.  MSK:  No focal spinal tenderness to palpation. Range of motion in the left upper extremity is slightly limited. Good range of motion in the left upper extremity. EXTREMITIES:  Nonpitting lymphedema in the left upper extremity. No additional peripheral edema noted.  SKIN:  Benign with no visible skin changes or rashes. No visible petechiae or ecchymoses. NEURO:  Nonfocal. Well oriented.  Appropriate affect.      LAB RESULTS:  Lab Results  Component Value Date   WBC 8.9 03/25/2013   NEUTROABS 5.8 03/25/2013   HGB 12.4 03/25/2013   HCT 38.4 03/25/2013   MCV 78.7* 03/25/2013   PLT 233 03/25/2013      Chemistry      Component Value Date/Time   NA 142 03/25/2013 0856   NA 139 01/21/2013 1028   K 3.9 03/25/2013 0856   K 3.6 01/21/2013 1028   CL 101 01/21/2013 1028   CO2 26 03/25/2013 0856   CO2 24 01/21/2013 1028   BUN 15.0 03/25/2013 0856   BUN 16 01/21/2013 1028   CREATININE 0.9 03/25/2013 0856   CREATININE 0.77 01/21/2013 1028   CREATININE 0.74 06/14/2012 1257      Component Value Date/Time   CALCIUM 9.7 03/25/2013 0856   CALCIUM 9.2 01/21/2013 1028   ALKPHOS 66 03/25/2013 0856   ALKPHOS 86 04/20/2010 0942   AST 14 03/25/2013 0856   AST 21 04/20/2010 0942   ALT 11 03/25/2013 0856   ALT 18 04/20/2010 0942   BILITOT 0.45 03/25/2013 0856   BILITOT 0.5 04/20/2010 0942       STUDIES: Nm Pet Image Initial (pi) Skull Base To Thigh  03/03/2013   CLINICAL DATA:  Initial treatment strategy for breast cancer.  EXAM: NUCLEAR MEDICINE PET SKULL BASE TO THIGH  FASTING BLOOD GLUCOSE:  Value: 111mg /dl  TECHNIQUE: 24.4 mCi W-10 FDG was injected intravenously. CT data was obtained and used for attenuation correction and anatomic localization only. (This was not acquired as a diagnostic CT examination.)  Additional exam technical data entered on technologist worksheet.  COMPARISON:  MR breast 02/16/2013 and CT abdomen pelvis 06/16/2012.  FINDINGS: NECK  No hypermetabolic lymph nodes in the  neck. CT images show no acute findings. Visualized portions of the paranasal sinuses and mastoid air cells are clear.  CHEST  Left supraclavicular adenopathy measures 11 mm (CT image 45), with an SUV max of 6.4. Extensive left subpectoral and left axillary adenopathy measures up to 3.3 x 5.4 cm deep to left pectoralis musculature, with an SUV max of 8.1.  A focal low attenuation collection in the medial right breast measures 1.7 x 2.8 cm (CT image 77) with an SUV max of 6.0. No additional areas of abnormal hypermetabolism in the chest.  CT images show atherosclerotic calcification of the arterial vasculature, including three-vessel involvement of the coronary arteries. Heart is enlarged. No pericardial effusion. Tiny hiatal hernia. Lungs are grossly clear. No pleural fluid.  ABDOMEN/PELVIS  No abnormal hypermetabolic activity within the liver, pancreas, adrenal glands or spleen. No hypermetabolic lymph nodes.  CT images show the liver, gallbladder, adrenal glands, kidneys, spleen, pancreas and stomach to be grossly unremarkable. There is a low ventral pelvic wall hernia containing unobstructed small bowel and colon. Bowel is otherwise grossly unremarkable. Atherosclerotic calcification of the arterial vasculature without abdominal aortic aneurysm. No free fluid.  SKELETON  No focal hypermetabolic activity to suggest skeletal metastasis. Sclerotic foci in the right femur and left iliac wing are unchanged from 04/20/2010. Extensive degenerative change in the spine.  IMPRESSION: 1. Metastatic left axillary, left subpectoral and left supraclavicular adenopathy. 2. Postoperative changes in the anterior left chest wall with associated mild hypermetabolism. 3. Low ventral pelvic wall hernia containing unobstructed small bowel and colon.    Electronically Signed   By: Leanna Battles M.D.   On: 03/03/2013 14:08    ASSESSMENT: 79 y.o. [place] woman  (1) status post right lumpectomy and axillary dissection in 1993 in [place] for what appears to have been a stage I breast cancer, treated with radiation and then tamoxifen for 5 years  (2) status post right mastectomy 2002 for a recurrence in the right breast, treated adjuvantly with tamoxifen for an additional 5 years  (3) status post left mastectomy and sentinel lymph node sampling 03/07/2003 for a pT1b, pN0, stage IA mucinous breast cancer, grade 2; no radiation therapy was given. Tamoxifen was continued until 2009  (4) excisional biopsy of a left chest wall mass 01/26/2013 showing carcinoma consistent with invasive ductal carcinoma, estrogen receptor 100% positive, progesterone receptor 0% positive, with an MIB-1 of 22% and no HER-2 amplification.   (5)  PET scan on 03/03/2013 confirmed extensive metastatic adenopathy in the left axilla, left subpectoral musculature, and left supraclavicular region  (6)  started on anastrozole daily beginning 03/01/2013.  (7)  scheduled to initiate radiation therapy under care of Dr. Mitzi Schmidt in mid December 2014.  PLAN: Overall, I think Litsy is doing well. She is ready to begin the radiation therapy as scheduled next week. I will confirm with both Dr. Mitzi Schmidt and Dr. Darnelle Catalan, but we will likely hold her anastrozole while she is receiving active radiation therapy, then resume a the medication once that has been completed.   Genesi is scheduled to see Dr. Darnelle Catalan in late January when she completes radiation therapy, and at that time they will discuss her plan for treatment and followup.   All this was reviewed in detail with [facility] today. She voices understanding and agreement with our plan. She knows as always to call with any changes or problems.    Dillard Essex   03/25/2013 4:33 PM

## 2013-03-28 ENCOUNTER — Telehealth: Payer: Self-pay | Admitting: *Deleted

## 2013-03-28 NOTE — Telephone Encounter (Signed)
Per PA-C request. Instructed patient to continue Anastrazole until day before starting radiation and restart day after last radiation treatment. Pt verbalized understanding. No further concerns.

## 2013-03-30 ENCOUNTER — Ambulatory Visit: Admission: RE | Admit: 2013-03-30 | Payer: Medicare Other | Source: Ambulatory Visit | Admitting: Radiation Oncology

## 2013-03-31 ENCOUNTER — Ambulatory Visit
Admission: RE | Admit: 2013-03-31 | Discharge: 2013-03-31 | Disposition: A | Discharge status: Home / Self Care | Payer: Medicare Other | Source: Ambulatory Visit | Attending: Radiation Oncology | Admitting: Radiation Oncology

## 2013-03-31 DIAGNOSIS — C50912 Malignant neoplasm of unspecified site of left female breast: Secondary | ICD-10-CM

## 2013-04-01 ENCOUNTER — Ambulatory Visit
Admission: RE | Admit: 2013-04-01 | Discharge: 2013-04-01 | Disposition: A | Discharge status: Home / Self Care | Payer: Medicare Other | Source: Ambulatory Visit | Attending: Radiation Oncology | Admitting: Radiation Oncology

## 2013-04-01 ENCOUNTER — Encounter: Payer: Self-pay | Admitting: Radiation Oncology

## 2013-04-01 VITALS — BP 143/67 | HR 87 | Temp 97.6°F | Resp 16 | Wt 231.5 lb

## 2013-04-01 DIAGNOSIS — C50912 Malignant neoplasm of unspecified site of left female breast: Secondary | ICD-10-CM

## 2013-04-01 MED ORDER — ALRA NON-METALLIC DEODORANT (RAD-ONC)
1.0000 "application " | Freq: Once | TOPICAL | Status: AC
  Administered 2013-04-01: 1 via TOPICAL

## 2013-04-01 MED ORDER — RADIAPLEXRX EX GEL
Freq: Once | CUTANEOUS | Status: AC
  Administered 2013-04-01: 14:00:00 via TOPICAL

## 2013-04-01 NOTE — Progress Notes (Signed)
Weekly rad tx lt cw  No skin changes, gave radiaplex gel and alra deodorant,rad book, my business card, pateint education done, teach back, no c/o 1:55 PM

## 2013-04-01 NOTE — Progress Notes (Signed)
  Radiation Oncology         [phone] ________________________________  Name: Tami Schmidt MRN: [id number]  Date: 03/31/2013  DOB: [birth year]  Simulation Verification Note   NARRATIVE: The patient was brought to the treatment unit and placed in the planned treatment position. The clinical setup was verified. Then port films were obtained and uploaded to the radiation oncology medical record software.  The treatment beams were carefully compared against the planned radiation fields. The position, location, and shape of the radiation fields was reviewed. The targeted volume of tissue appears to be appropriately covered by the radiation beams. Based on my personal review, I approved the simulation verification. The patient's treatment will proceed as planned.  ________________________________   Radene Gunning, MD, PhD

## 2013-04-01 NOTE — Progress Notes (Signed)
   Department of Radiation Oncology  Phone:  [phone] Fax:        [phone]  Weekly Treatment Note    Name: Tami Schmidt Date: 04/01/2013 MRN: [id number] DOB: 06-Sep-1932   Current dose: 3.6 Gy  Current fraction: 2   MEDICATIONS: Current Outpatient Prescriptions  Medication Sig Dispense Refill  . hyaluronate sodium (RADIAPLEXRX) GEL Apply 1 application topically 2 (two) times daily.      Melene Muller ON 04/04/2013] non-metallic deodorant (ALRA) MISC Apply 1 application topically daily as needed.      Marland Kitchen amLODipine (NORVASC) 10 MG tablet Take 10 mg by mouth daily.      Marland Kitchen anastrozole (ARIMIDEX) 1 MG tablet Take 1 tablet (1 mg total) by mouth daily.  90 tablet  12  . Choline Fenofibrate (FENOFIBRIC ACID) 135 MG CPDR Take 1 tablet by mouth daily.       . fish oil-omega-3 fatty acids 1000 MG capsule Take 2 g by mouth daily.      . Insulin Isophane Human (HUMULIN N [place]) Inject 20-40 Units into the skin every morning. 40 units in the am and 20 units in the pm      . losartan-hydrochlorothiazide (HYZAAR) 100-25 MG per tablet Take 1 tablet by mouth daily.       . metFORMIN (GLUCOPHAGE) 500 MG tablet Take 500 mg by mouth daily with breakfast.       No current facility-administered medications for this encounter.     ALLERGIES: Bactrim; Lisinopril; and Vasotec   LABORATORY DATA:  Lab Results  Component Value Date   WBC 8.9 03/25/2013   HGB 12.4 03/25/2013   HCT 38.4 03/25/2013   MCV 78.7* 03/25/2013   PLT 233 03/25/2013   Lab Results  Component Value Date   NA 142 03/25/2013   K 3.9 03/25/2013   CL 101 01/21/2013   CO2 26 03/25/2013   Lab Results  Component Value Date   ALT 11 03/25/2013   AST 14 03/25/2013   ALKPHOS 66 03/25/2013   BILITOT 0.45 03/25/2013     NARRATIVE: Tami Schmidt was seen today for weekly treatment management. The chart was checked and the patient's films were reviewed. The patient states that she is doing well in her first week of  treatment. No problems so far or complaints.  PHYSICAL EXAMINATION: weight is 231 lb 8 oz (105.008 kg). Her oral temperature is 97.6 F (36.4 C). Her blood pressure is 143/67 and her pulse is 87. Her respiration is 16.        ASSESSMENT: The patient is doing satisfactorily with treatment.  PLAN: We will continue with the patient's radiation treatment as planned.

## 2013-04-04 ENCOUNTER — Ambulatory Visit
Admission: RE | Admit: 2013-04-04 | Discharge: 2013-04-04 | Disposition: A | Discharge status: Home / Self Care | Payer: Medicare Other | Source: Ambulatory Visit | Attending: Radiation Oncology | Admitting: Radiation Oncology

## 2013-04-05 ENCOUNTER — Ambulatory Visit
Admission: RE | Admit: 2013-04-05 | Discharge: 2013-04-05 | Disposition: A | Discharge status: Home / Self Care | Payer: Medicare Other | Source: Ambulatory Visit | Attending: Radiation Oncology | Admitting: Radiation Oncology

## 2013-04-06 ENCOUNTER — Ambulatory Visit
Admission: RE | Admit: 2013-04-06 | Discharge: 2013-04-06 | Disposition: A | Discharge status: Home / Self Care | Payer: Medicare Other | Source: Ambulatory Visit | Attending: Radiation Oncology | Admitting: Radiation Oncology

## 2013-04-08 ENCOUNTER — Ambulatory Visit
Admission: RE | Admit: 2013-04-08 | Discharge: 2013-04-08 | Disposition: A | Discharge status: Home / Self Care | Payer: Medicare Other | Source: Ambulatory Visit | Attending: Radiation Oncology | Admitting: Radiation Oncology

## 2013-04-08 ENCOUNTER — Encounter: Payer: Self-pay | Admitting: Radiation Oncology

## 2013-04-08 VITALS — BP 150/54 | HR 83 | Temp 98.0°F | Wt 235.7 lb

## 2013-04-08 DIAGNOSIS — C50912 Malignant neoplasm of unspecified site of left female breast: Secondary | ICD-10-CM

## 2013-04-08 NOTE — Progress Notes (Signed)
  Radiation Oncology         [phone] ________________________________  Name: Tami Schmidt MRN: [id number]  Date: 04/08/2013  DOB: 03-Dec-1932  Weekly Radiation Therapy Management  Current Dose: 9 Gy     Planned Dose:  50.4 Gy  Narrative . . . . . . . . The patient presents for routine under treatment assessment.                                   The patient is without complaint.                                 Set-up films were reviewed.                                 The chart was checked. Physical Findings. . .  weight is 235 lb 11.2 oz (106.913 kg). Her temperature is 98 F (36.7 C). Her blood pressure is 150/54 and her pulse is 83. . Weight essentially stable.  No significant changes. Impression . . . . . . . The patient is tolerating radiation. Plan . . . . . . . . . . . . Continue treatment as planned.  ________________________________  Artist Pais. Kathrynn Running, M.D.

## 2013-04-08 NOTE — Progress Notes (Signed)
Patient for weekly assessment of radiation to left breast.Today will abe 6 treatments completed.Mild discoloration of skin.Denies pain or any other concerns.Seen by doctor  prior to treatment as machine behind.

## 2013-04-11 ENCOUNTER — Ambulatory Visit
Admission: RE | Admit: 2013-04-11 | Discharge: 2013-04-11 | Disposition: A | Discharge status: Home / Self Care | Payer: Medicare Other | Source: Ambulatory Visit | Attending: Radiation Oncology | Admitting: Radiation Oncology

## 2013-04-12 ENCOUNTER — Ambulatory Visit
Admission: RE | Admit: 2013-04-12 | Discharge: 2013-04-12 | Disposition: A | Discharge status: Home / Self Care | Payer: Medicare Other | Source: Ambulatory Visit | Attending: Radiation Oncology | Admitting: Radiation Oncology

## 2013-04-13 ENCOUNTER — Ambulatory Visit
Admission: RE | Admit: 2013-04-13 | Discharge: 2013-04-13 | Disposition: A | Discharge status: Home / Self Care | Payer: Medicare Other | Source: Ambulatory Visit | Attending: Radiation Oncology | Admitting: Radiation Oncology

## 2013-04-15 ENCOUNTER — Ambulatory Visit
Admission: RE | Admit: 2013-04-15 | Discharge: 2013-04-15 | Disposition: A | Discharge status: Home / Self Care | Payer: Medicare Other | Source: Ambulatory Visit | Attending: Radiation Oncology | Admitting: Radiation Oncology

## 2013-04-15 ENCOUNTER — Encounter: Payer: Self-pay | Admitting: Radiation Oncology

## 2013-04-15 VITALS — BP 147/57 | HR 84 | Temp 98.0°F | Resp 20 | Wt 232.8 lb

## 2013-04-15 DIAGNOSIS — C7989 Secondary malignant neoplasm of other specified sites: Principal | ICD-10-CM

## 2013-04-15 DIAGNOSIS — C50912 Malignant neoplasm of unspecified site of left female breast: Secondary | ICD-10-CM

## 2013-04-15 NOTE — Progress Notes (Signed)
   [facility] Radiation Oncology  Phone:  [phone] Fax:        [phone]  Weekly Treatment Note    Name: Tami Schmidt Date: 04/15/2013 MRN: [id number] DOB: [birth year]   Current dose: 18 Gy  Current fraction:10   MEDICATIONS: Current Outpatient Prescriptions  Medication Sig Dispense Refill  . amLODipine (NORVASC) 10 MG tablet Take 10 mg by mouth daily.      Marland Kitchen anastrozole (ARIMIDEX) 1 MG tablet Take 1 tablet (1 mg total) by mouth daily.  90 tablet  12  . Choline Fenofibrate (FENOFIBRIC ACID) 135 MG CPDR Take 1 tablet by mouth daily.       . fish oil-omega-3 fatty acids 1000 MG capsule Take 2 g by mouth daily.      . hyaluronate sodium (RADIAPLEXRX) GEL Apply 1 application topically 2 (two) times daily.      . Insulin Isophane Human (HUMULIN N [place]) Inject 20-40 Units into the skin every morning. 40 units in the am and 20 units in the pm      . losartan-hydrochlorothiazide (HYZAAR) 100-25 MG per tablet Take 1 tablet by mouth daily.       . metFORMIN (GLUCOPHAGE) 500 MG tablet Take 500 mg by mouth daily with breakfast.      . non-metallic deodorant (ALRA) MISC Apply 1 application topically daily as needed.       No current facility-administered medications for this encounter.     ALLERGIES: Bactrim; Lisinopril; and Vasotec   LABORATORY DATA:  Lab Results  Component Value Date   WBC 8.9 03/25/2013   HGB 12.4 03/25/2013   HCT 38.4 03/25/2013   MCV 78.7* 03/25/2013   PLT 233 03/25/2013   Lab Results  Component Value Date   NA 142 03/25/2013   K 3.9 03/25/2013   CL 101 01/21/2013   CO2 26 03/25/2013   Lab Results  Component Value Date   ALT 11 03/25/2013   AST 14 03/25/2013   ALKPHOS 66 03/25/2013   BILITOT 0.45 03/25/2013     NARRATIVE: Tami Schmidt was seen today for weekly treatment management. The chart was checked and the patient's films were reviewed. The patient states that she is doing well. No major changes in her skin. She is using skin  cream twice a day currently.  PHYSICAL EXAMINATION: weight is 232 lb 12.8 oz (105.597 kg). Her oral temperature is 98 F (36.7 C). Her blood pressure is 147/57 and her pulse is 84. Her respiration is 20.      diffuse hyperpigmentation emerging. No desquamation. Overall her skin looks good.  ASSESSMENT: The patient is doing satisfactorily with treatment.  PLAN: We will continue with the patient's radiation treatment as planned.

## 2013-04-15 NOTE — Progress Notes (Signed)
Weekly rad  Txs, 10 lt cw , heperpigmentation skin intact, no c/o pain, no c/o fatigue,appetite good 12:07 PM

## 2013-04-18 ENCOUNTER — Ambulatory Visit
Admission: RE | Admit: 2013-04-18 | Discharge: 2013-04-18 | Disposition: A | Discharge status: Home / Self Care | Payer: Medicare Other | Source: Ambulatory Visit | Attending: Radiation Oncology | Admitting: Radiation Oncology

## 2013-04-19 ENCOUNTER — Ambulatory Visit
Admission: RE | Admit: 2013-04-19 | Discharge: 2013-04-19 | Disposition: A | Discharge status: Home / Self Care | Payer: Medicare Other | Source: Ambulatory Visit | Attending: Radiation Oncology | Admitting: Radiation Oncology

## 2013-04-20 ENCOUNTER — Ambulatory Visit
Admission: RE | Admit: 2013-04-20 | Discharge: 2013-04-20 | Disposition: A | Discharge status: Home / Self Care | Payer: Medicare Other | Source: Ambulatory Visit | Attending: Radiation Oncology | Admitting: Radiation Oncology

## 2013-04-21 ENCOUNTER — Ambulatory Visit
Admission: RE | Admit: 2013-04-21 | Discharge: 2013-04-21 | Disposition: A | Discharge status: Home / Self Care | Payer: Medicare Other | Source: Ambulatory Visit | Attending: Radiation Oncology | Admitting: Radiation Oncology

## 2013-04-22 ENCOUNTER — Ambulatory Visit
Admission: RE | Admit: 2013-04-22 | Discharge: 2013-04-22 | Disposition: A | Discharge status: Home / Self Care | Payer: Medicare Other | Source: Ambulatory Visit | Attending: Radiation Oncology | Admitting: Radiation Oncology

## 2013-04-22 ENCOUNTER — Encounter: Payer: Self-pay | Admitting: Radiation Oncology

## 2013-04-22 VITALS — BP 138/77 | HR 79 | Temp 98.1°F | Resp 20 | Wt 237.4 lb

## 2013-04-22 DIAGNOSIS — C7989 Secondary malignant neoplasm of other specified sites: Principal | ICD-10-CM

## 2013-04-22 DIAGNOSIS — C50912 Malignant neoplasm of unspecified site of left female breast: Secondary | ICD-10-CM

## 2013-04-22 NOTE — Progress Notes (Signed)
Pt denies pain, loss of appetite. She is fatigued. She is applying Radiaplex to left chest wall treatment area for darkening of skin.

## 2013-04-22 NOTE — Progress Notes (Signed)
   Department of Radiation Oncology  Phone:  [phone] Fax:        [phone]  Weekly Treatment Note    Name: Tami Schmidt Date: 04/22/2013 MRN: [id number] DOB: 1932-12-31   Current dose: 27 Gy  Current fraction: 15   MEDICATIONS: Current Outpatient Prescriptions  Medication Sig Dispense Refill  . amLODipine (NORVASC) 10 MG tablet Take 10 mg by mouth daily.      Marland Kitchen anastrozole (ARIMIDEX) 1 MG tablet Take 1 tablet (1 mg total) by mouth daily.  90 tablet  12  . Choline Fenofibrate (FENOFIBRIC ACID) 135 MG CPDR Take 1 tablet by mouth daily.       . fish oil-omega-3 fatty acids 1000 MG capsule Take 2 g by mouth daily.      . hyaluronate sodium (RADIAPLEXRX) GEL Apply 1 application topically 2 (two) times daily.      . Insulin Isophane Human (HUMULIN N [place]) Inject 20-40 Units into the skin every morning. 40 units in the am and 20 units in the pm      . losartan-hydrochlorothiazide (HYZAAR) 100-25 MG per tablet Take 1 tablet by mouth daily.       . metFORMIN (GLUCOPHAGE) 500 MG tablet Take 500 mg by mouth daily with breakfast.      . non-metallic deodorant (ALRA) MISC Apply 1 application topically daily as needed.       No current facility-administered medications for this encounter.     ALLERGIES: Bactrim; Lisinopril; and Vasotec   LABORATORY DATA:  Lab Results  Component Value Date   WBC 8.9 03/25/2013   HGB 12.4 03/25/2013   HCT 38.4 03/25/2013   MCV 78.7* 03/25/2013   PLT 233 03/25/2013   Lab Results  Component Value Date   NA 142 03/25/2013   K 3.9 03/25/2013   CL 101 01/21/2013   CO2 26 03/25/2013   Lab Results  Component Value Date   ALT 11 03/25/2013   AST 14 03/25/2013   ALKPHOS 66 03/25/2013   BILITOT 0.45 03/25/2013     NARRATIVE: Tami Schmidt was seen today for weekly treatment management. The chart was checked and the patient's films were reviewed. The patient is doing well this week. Some fatigue and she has noticed some darkening of the  skin.  PHYSICAL EXAMINATION: weight is 237 lb 6.4 oz (107.684 kg). Her oral temperature is 98.1 F (36.7 C). Her blood pressure is 138/77 and her pulse is 79. Her respiration is 20.      hyperpigmentation present, no desquamation  ASSESSMENT: The patient is doing satisfactorily with treatment.  PLAN: We will continue with the patient's radiation treatment as planned. She will continue using her skin cream daily.

## 2013-04-25 ENCOUNTER — Ambulatory Visit
Admission: RE | Admit: 2013-04-25 | Discharge: 2013-04-25 | Disposition: A | Discharge status: Home / Self Care | Payer: Medicare Other | Source: Ambulatory Visit | Attending: Radiation Oncology | Admitting: Radiation Oncology

## 2013-04-26 ENCOUNTER — Ambulatory Visit
Admission: RE | Admit: 2013-04-26 | Discharge: 2013-04-26 | Disposition: A | Discharge status: Home / Self Care | Payer: Medicare Other | Source: Ambulatory Visit | Attending: Radiation Oncology | Admitting: Radiation Oncology

## 2013-04-27 ENCOUNTER — Ambulatory Visit
Admission: RE | Admit: 2013-04-27 | Discharge: 2013-04-27 | Disposition: A | Discharge status: Home / Self Care | Payer: Medicare Other | Source: Ambulatory Visit | Attending: Radiation Oncology | Admitting: Radiation Oncology

## 2013-04-28 ENCOUNTER — Ambulatory Visit
Admission: RE | Admit: 2013-04-28 | Discharge: 2013-04-28 | Disposition: A | Discharge status: Home / Self Care | Payer: Medicare Other | Source: Ambulatory Visit | Attending: Radiation Oncology | Admitting: Radiation Oncology

## 2013-04-29 ENCOUNTER — Ambulatory Visit
Admission: RE | Admit: 2013-04-29 | Discharge: 2013-04-29 | Disposition: A | Discharge status: Home / Self Care | Payer: Medicare Other | Source: Ambulatory Visit | Attending: Radiation Oncology | Admitting: Radiation Oncology

## 2013-04-29 ENCOUNTER — Encounter: Payer: Self-pay | Admitting: Radiation Oncology

## 2013-04-29 VITALS — BP 112/65 | HR 76 | Temp 98.1°F | Resp 20 | Wt 231.9 lb

## 2013-04-29 DIAGNOSIS — C50919 Malignant neoplasm of unspecified site of unspecified female breast: Secondary | ICD-10-CM

## 2013-04-29 DIAGNOSIS — C7989 Secondary malignant neoplasm of other specified sites: Principal | ICD-10-CM

## 2013-04-29 NOTE — Progress Notes (Signed)
   Department of Radiation Oncology  Phone:  [phone] Fax:        [phone]  Weekly Treatment Note    Name: Tami Schmidt Date: 04/29/2013 MRN: [id number] DOB: 10-Dec-1932   Current dose: 36 Gy  Current fraction: 20   MEDICATIONS: Current Outpatient Prescriptions  Medication Sig Dispense Refill  . amLODipine (NORVASC) 10 MG tablet Take 10 mg by mouth daily.      Marland Kitchen anastrozole (ARIMIDEX) 1 MG tablet Take 1 tablet (1 mg total) by mouth daily.  90 tablet  12  . Choline Fenofibrate (FENOFIBRIC ACID) 135 MG CPDR Take 1 tablet by mouth daily.       . fish oil-omega-3 fatty acids 1000 MG capsule Take 2 g by mouth daily.      . hyaluronate sodium (RADIAPLEXRX) GEL Apply 1 application topically 2 (two) times daily.      . Insulin Isophane Human (HUMULIN N [place]) Inject 20-40 Units into the skin every morning. 40 units in the am and 20 units in the pm      . losartan-hydrochlorothiazide (HYZAAR) 100-25 MG per tablet Take 1 tablet by mouth daily.       . metFORMIN (GLUCOPHAGE) 500 MG tablet Take 500 mg by mouth daily with breakfast.      . non-metallic deodorant (ALRA) MISC Apply 1 application topically daily as needed.       No current facility-administered medications for this encounter.     ALLERGIES: Bactrim; Lisinopril; and Vasotec   LABORATORY DATA:  Lab Results  Component Value Date   WBC 8.9 03/25/2013   HGB 12.4 03/25/2013   HCT 38.4 03/25/2013   MCV 78.7* 03/25/2013   PLT 233 03/25/2013   Lab Results  Component Value Date   NA 142 03/25/2013   K 3.9 03/25/2013   CL 101 01/21/2013   CO2 26 03/25/2013   Lab Results  Component Value Date   ALT 11 03/25/2013   AST 14 03/25/2013   ALKPHOS 66 03/25/2013   BILITOT 0.45 03/25/2013     NARRATIVE: Tami Schmidt was seen today for weekly treatment management. The chart was checked and the patient's films were reviewed. The patient states she is doing well. She does notice a little bit more darkening of the  skin. No significant irritation.  PHYSICAL EXAMINATION: weight is 231 lb 14.4 oz (105.189 kg). Her oral temperature is 98.1 F (36.7 C). Her blood pressure is 112/65 and her pulse is 76. Her respiration is 20.      diffuse hyperpigmentation in the treatment area, overall her skin looks quite good.  ASSESSMENT: The patient is doing satisfactorily with treatment.  PLAN: We will continue with the patient's radiation treatment as planned.

## 2013-04-29 NOTE — Progress Notes (Signed)
Weekly rad txs 20 lcw completed,  Hyperpigmentation,skin intact, no c/o pain, using radiaplex bid Appetite good, energy level good 9:54 AM

## 2013-05-02 ENCOUNTER — Ambulatory Visit
Admission: RE | Admit: 2013-05-02 | Discharge: 2013-05-02 | Disposition: A | Discharge status: Home / Self Care | Payer: Medicare Other | Source: Ambulatory Visit | Attending: Radiation Oncology | Admitting: Radiation Oncology

## 2013-05-03 ENCOUNTER — Ambulatory Visit
Admission: RE | Admit: 2013-05-03 | Discharge: 2013-05-03 | Disposition: A | Discharge status: Home / Self Care | Payer: Medicare Other | Source: Ambulatory Visit | Attending: Radiation Oncology | Admitting: Radiation Oncology

## 2013-05-04 ENCOUNTER — Ambulatory Visit
Admission: RE | Admit: 2013-05-04 | Discharge: 2013-05-04 | Disposition: A | Discharge status: Home / Self Care | Payer: Medicare Other | Source: Ambulatory Visit | Attending: Radiation Oncology | Admitting: Radiation Oncology

## 2013-05-05 ENCOUNTER — Ambulatory Visit
Admission: RE | Admit: 2013-05-05 | Discharge: 2013-05-05 | Disposition: A | Discharge status: Home / Self Care | Payer: Medicare Other | Source: Ambulatory Visit | Attending: Radiation Oncology | Admitting: Radiation Oncology

## 2013-05-06 ENCOUNTER — Ambulatory Visit
Admission: RE | Admit: 2013-05-06 | Discharge: 2013-05-06 | Disposition: A | Discharge status: Home / Self Care | Payer: Medicare Other | Source: Ambulatory Visit | Attending: Radiation Oncology | Admitting: Radiation Oncology

## 2013-05-06 DIAGNOSIS — C50919 Malignant neoplasm of unspecified site of unspecified female breast: Secondary | ICD-10-CM

## 2013-05-06 DIAGNOSIS — C7989 Secondary malignant neoplasm of other specified sites: Principal | ICD-10-CM

## 2013-05-06 NOTE — Progress Notes (Signed)
   Department of Radiation Oncology  Phone:  [phone] Fax:        [phone]  Weekly Treatment Note    Name: Tami Schmidt Date: 05/06/2013 MRN: [id number] DOB: 1932/12/30    Current fraction: 25   MEDICATIONS: Current Outpatient Prescriptions  Medication Sig Dispense Refill  . amLODipine (NORVASC) 10 MG tablet Take 10 mg by mouth daily.      Marland Kitchen anastrozole (ARIMIDEX) 1 MG tablet Take 1 tablet (1 mg total) by mouth daily.  90 tablet  12  . Choline Fenofibrate (FENOFIBRIC ACID) 135 MG CPDR Take 1 tablet by mouth daily.       . fish oil-omega-3 fatty acids 1000 MG capsule Take 2 g by mouth daily.      . hyaluronate sodium (RADIAPLEXRX) GEL Apply 1 application topically 2 (two) times daily.      . Insulin Isophane Human (HUMULIN N [place]) Inject 20-40 Units into the skin every morning. 40 units in the am and 20 units in the pm      . losartan-hydrochlorothiazide (HYZAAR) 100-25 MG per tablet Take 1 tablet by mouth daily.       . metFORMIN (GLUCOPHAGE) 500 MG tablet Take 500 mg by mouth daily with breakfast.      . non-metallic deodorant (ALRA) MISC Apply 1 application topically daily as needed.       No current facility-administered medications for this encounter.     ALLERGIES: Bactrim; Lisinopril; and Vasotec   LABORATORY DATA:  Lab Results  Component Value Date   WBC 8.9 03/25/2013   HGB 12.4 03/25/2013   HCT 38.4 03/25/2013   MCV 78.7* 03/25/2013   PLT 233 03/25/2013   Lab Results  Component Value Date   NA 142 03/25/2013   K 3.9 03/25/2013   CL 101 01/21/2013   CO2 26 03/25/2013   Lab Results  Component Value Date   ALT 11 03/25/2013   AST 14 03/25/2013   ALKPHOS 66 03/25/2013   BILITOT 0.45 03/25/2013     NARRATIVE: Lilou B Conry was seen today for weekly treatment management. The chart was checked and the patient's films were reviewed. The patient is doing fairly well. Her skin has held up well. She will be beginning her boost treatment in the  near future.  PHYSICAL EXAMINATION: Hyperpigmentation present in the treatment area. No significant areas of desquamation. A small area of dry desquamation in the left supraclavicular region.         ASSESSMENT: The patient is doing satisfactorily with treatment.  PLAN: We will continue with the patient's radiation treatment as planned.

## 2013-05-09 ENCOUNTER — Ambulatory Visit
Admission: RE | Admit: 2013-05-09 | Discharge: 2013-05-09 | Disposition: A | Discharge status: Home / Self Care | Payer: Medicare Other | Source: Ambulatory Visit | Attending: Radiation Oncology | Admitting: Radiation Oncology

## 2013-05-09 ENCOUNTER — Other Ambulatory Visit: Payer: Self-pay | Admitting: *Deleted

## 2013-05-09 DIAGNOSIS — C50919 Malignant neoplasm of unspecified site of unspecified female breast: Secondary | ICD-10-CM

## 2013-05-09 DIAGNOSIS — C7989 Secondary malignant neoplasm of other specified sites: Principal | ICD-10-CM

## 2013-05-10 ENCOUNTER — Ambulatory Visit
Admission: RE | Admit: 2013-05-10 | Discharge: 2013-05-10 | Disposition: A | Discharge status: Home / Self Care | Payer: Medicare Other | Source: Ambulatory Visit | Attending: Radiation Oncology | Admitting: Radiation Oncology

## 2013-05-10 ENCOUNTER — Encounter: Payer: Self-pay | Admitting: Radiation Oncology

## 2013-05-10 ENCOUNTER — Ambulatory Visit: Payer: Medicare Other | Admitting: Oncology

## 2013-05-10 ENCOUNTER — Other Ambulatory Visit: Payer: Medicare Other

## 2013-05-11 ENCOUNTER — Ambulatory Visit: Admission: RE | Admit: 2013-05-11 | Payer: Medicare Other | Source: Ambulatory Visit

## 2013-05-12 ENCOUNTER — Ambulatory Visit
Admission: RE | Admit: 2013-05-12 | Discharge: 2013-05-12 | Disposition: A | Discharge status: Home / Self Care | Payer: Medicare Other | Source: Ambulatory Visit | Attending: Radiation Oncology | Admitting: Radiation Oncology

## 2013-05-12 ENCOUNTER — Ambulatory Visit: Payer: Medicare Other

## 2013-05-13 ENCOUNTER — Ambulatory Visit
Admission: RE | Admit: 2013-05-13 | Discharge: 2013-05-13 | Disposition: A | Discharge status: Home / Self Care | Payer: Medicare Other | Source: Ambulatory Visit | Attending: Radiation Oncology | Admitting: Radiation Oncology

## 2013-05-13 ENCOUNTER — Encounter: Payer: Self-pay | Admitting: Radiation Oncology

## 2013-05-13 ENCOUNTER — Ambulatory Visit: Payer: Medicare Other

## 2013-05-13 VITALS — BP 152/70 | HR 77 | Temp 98.2°F | Resp 20 | Wt 231.0 lb

## 2013-05-13 DIAGNOSIS — C7989 Secondary malignant neoplasm of other specified sites: Principal | ICD-10-CM

## 2013-05-13 DIAGNOSIS — C50919 Malignant neoplasm of unspecified site of unspecified female breast: Secondary | ICD-10-CM

## 2013-05-13 NOTE — Progress Notes (Signed)
   Department of Radiation Oncology  Phone:  [phone] Fax:        [phone]  Weekly Treatment Note    Name: Tami Schmidt Date: 05/13/2013 MRN: [id number] DOB: 04-17-1932   Current dose: 50.4 Gy  Current fraction: 29   MEDICATIONS: Current Outpatient Prescriptions  Medication Sig Dispense Refill  . amLODipine (NORVASC) 10 MG tablet Take 10 mg by mouth daily.      Marland Kitchen anastrozole (ARIMIDEX) 1 MG tablet Take 1 tablet (1 mg total) by mouth daily.  90 tablet  12  . Choline Fenofibrate (FENOFIBRIC ACID) 135 MG CPDR Take 1 tablet by mouth daily.       . fish oil-omega-3 fatty acids 1000 MG capsule Take 2 g by mouth daily.      . hyaluronate sodium (RADIAPLEXRX) GEL Apply 1 application topically 2 (two) times daily.      . Insulin Isophane Human (HUMULIN N [place]) Inject 20-40 Units into the skin every morning. 40 units in the am and 20 units in the pm      . losartan-hydrochlorothiazide (HYZAAR) 100-25 MG per tablet Take 1 tablet by mouth daily.       . metFORMIN (GLUCOPHAGE) 500 MG tablet Take 500 mg by mouth daily with breakfast.      . non-metallic deodorant (ALRA) MISC Apply 1 application topically daily as needed.       No current facility-administered medications for this encounter.     ALLERGIES: Bactrim; Lisinopril; and Vasotec   LABORATORY DATA:  Lab Results  Component Value Date   WBC 8.9 03/25/2013   HGB 12.4 03/25/2013   HCT 38.4 03/25/2013   MCV 78.7* 03/25/2013   PLT 233 03/25/2013   Lab Results  Component Value Date   NA 142 03/25/2013   K 3.9 03/25/2013   CL 101 01/21/2013   CO2 26 03/25/2013   Lab Results  Component Value Date   ALT 11 03/25/2013   AST 14 03/25/2013   ALKPHOS 66 03/25/2013   BILITOT 0.45 03/25/2013     NARRATIVE: Tami Schmidt was seen today for weekly treatment management. The chart was checked and the patient's films were reviewed. The patient is doing well overall. Increased skin irritation as expected. No major  changes. She continues to use skin cream.  PHYSICAL EXAMINATION: weight is 231 lb (104.781 kg). Her oral temperature is 98.2 F (36.8 C). Her blood pressure is 152/70 and her pulse is 77. Her respiration is 20.      hyperpigmentation present. No moist desquamation. The boost site looks good and this was verified today for her set up.  ASSESSMENT: The patient is doing satisfactorily with treatment.  PLAN: We will continue with the patient's radiation treatment as planned.

## 2013-05-13 NOTE — Progress Notes (Signed)
  Radiation Oncology         [phone] ________________________________  Name: Tami Schmidt MRN: [id number]  Date: 05/10/2013  DOB: 1932/12/08  Complex simulation note  The patient has undergone complex simulation for her upcoming boost treatment for her diagnosis of breast cancer. The patient has initially been planned to receive 50.4 Gy. The patient will now receive a 14 Gy boost to the mastectomy scar which has been contoured. This will be accomplished using an en face electron field. Based on the depth of the target area, 9 MeV electrons will be used and this field has been normalized to the 95 % isodose line. The patient's final total dose therefore will be 64.4 Gy. A special port plan is requested for the boost treatment.   _______________________________  Jodelle Gross, MD, PhD

## 2013-05-13 NOTE — Progress Notes (Signed)
Weekly rad tx left breast, 29 txs sofar, hyperpigmentation left chest wall , under axilla skin has peeled , "that's where it hurts" states patient, will put neosporin under axilla , radiaplex elsewhere on treated skin area, fatigued, and good appetite,stays hydratd 9:54 AM

## 2013-05-16 ENCOUNTER — Ambulatory Visit
Admission: RE | Admit: 2013-05-16 | Discharge: 2013-05-16 | Disposition: A | Discharge status: Home / Self Care | Payer: Medicare Other | Source: Ambulatory Visit | Attending: Radiation Oncology | Admitting: Radiation Oncology

## 2013-05-17 ENCOUNTER — Ambulatory Visit
Admission: RE | Admit: 2013-05-17 | Discharge: 2013-05-17 | Disposition: A | Discharge status: Home / Self Care | Payer: Medicare Other | Source: Ambulatory Visit | Attending: Radiation Oncology | Admitting: Radiation Oncology

## 2013-05-18 ENCOUNTER — Telehealth: Payer: Self-pay | Admitting: *Deleted

## 2013-05-18 ENCOUNTER — Ambulatory Visit
Admission: RE | Admit: 2013-05-18 | Discharge: 2013-05-18 | Disposition: A | Discharge status: Home / Self Care | Payer: Medicare Other | Source: Ambulatory Visit | Attending: Radiation Oncology | Admitting: Radiation Oncology

## 2013-05-18 NOTE — Telephone Encounter (Signed)
appts made and printed...td 

## 2013-05-19 ENCOUNTER — Ambulatory Visit
Admission: RE | Admit: 2013-05-19 | Discharge: 2013-05-19 | Disposition: A | Discharge status: Home / Self Care | Payer: Medicare Other | Source: Ambulatory Visit | Attending: Radiation Oncology | Admitting: Radiation Oncology

## 2013-05-20 ENCOUNTER — Ambulatory Visit
Admission: RE | Admit: 2013-05-20 | Discharge: 2013-05-20 | Disposition: A | Discharge status: Home / Self Care | Payer: Medicare Other | Source: Ambulatory Visit | Attending: Radiation Oncology | Admitting: Radiation Oncology

## 2013-05-20 ENCOUNTER — Encounter: Payer: Self-pay | Admitting: Radiation Oncology

## 2013-05-20 VITALS — BP 136/74 | HR 82 | Resp 16 | Wt 230.7 lb

## 2013-05-20 DIAGNOSIS — C7989 Secondary malignant neoplasm of other specified sites: Secondary | ICD-10-CM

## 2013-05-20 DIAGNOSIS — C50919 Malignant neoplasm of unspecified site of unspecified female breast: Secondary | ICD-10-CM

## 2013-05-20 MED ORDER — RADIAPLEXRX EX GEL
Freq: Once | CUTANEOUS | Status: AC
  Administered 2013-05-20: 10:00:00 via TOPICAL

## 2013-05-20 NOTE — Progress Notes (Signed)
Patient has 1 mon th f/u appt card, requested another radiaplex gel cream is out 10:13 AM

## 2013-05-20 NOTE — Progress Notes (Signed)
Hyperpigmentation of treatment area noted. Dry desquamation noted left anterior neck. Patient reports using radiaplex bid to treatment area plus neosporin on area of desquamation. Denies fatigue. Patient has no other complaints at this time. Patient completes treatment on Monday thus, a one month follow up appt card was given.

## 2013-05-20 NOTE — Progress Notes (Signed)
   Department of Radiation Oncology  Phone:  [phone] Fax:        [phone]  Weekly Treatment Note    Name: Tami Schmidt Date: 05/20/2013 MRN: [id number] DOB: 1932-08-27   Current dose: 62.4 Gy  Current fraction: 34   MEDICATIONS: Current Outpatient Prescriptions  Medication Sig Dispense Refill  . amLODipine (NORVASC) 10 MG tablet Take 10 mg by mouth daily.      Marland Kitchen anastrozole (ARIMIDEX) 1 MG tablet Take 1 tablet (1 mg total) by mouth daily.  90 tablet  12  . Choline Fenofibrate (FENOFIBRIC ACID) 135 MG CPDR Take 1 tablet by mouth daily.       . fish oil-omega-3 fatty acids 1000 MG capsule Take 2 g by mouth daily.      . hyaluronate sodium (RADIAPLEXRX) GEL Apply 1 application topically 2 (two) times daily. 3rd tube given 05/20/13      . Insulin Isophane Human (HUMULIN N [place]) Inject 20-40 Units into the skin every morning. 40 units in the am and 20 units in the pm      . losartan-hydrochlorothiazide (HYZAAR) 100-25 MG per tablet Take 1 tablet by mouth daily.       . metFORMIN (GLUCOPHAGE) 500 MG tablet Take 500 mg by mouth daily with breakfast.      . non-metallic deodorant (ALRA) MISC Apply 1 application topically daily as needed.       No current facility-administered medications for this encounter.     ALLERGIES: Bactrim; Lisinopril; and Vasotec   LABORATORY DATA:  Lab Results  Component Value Date   WBC 8.9 03/25/2013   HGB 12.4 03/25/2013   HCT 38.4 03/25/2013   MCV 78.7* 03/25/2013   PLT 233 03/25/2013   Lab Results  Component Value Date   NA 142 03/25/2013   K 3.9 03/25/2013   CL 101 01/21/2013   CO2 26 03/25/2013   Lab Results  Component Value Date   ALT 11 03/25/2013   AST 14 03/25/2013   ALKPHOS 66 03/25/2013   BILITOT 0.45 03/25/2013     NARRATIVE: Tami Schmidt was seen today for weekly treatment management. The chart was checked and the patient's films were reviewed. The patient is doing fairly well. Skin irritation most prominent in  the supraclavicular region. The boost site has not caused too much irritation.  PHYSICAL EXAMINATION: weight is 230 lb 11.2 oz (104.645 kg). Her blood pressure is 136/74 and her pulse is 82. Her respiration is 16.      hyperpigmentation present diffusely in the treatment area. An area of dry desquamation is present in the left supraclavicular region. No moist desquamation.  ASSESSMENT: The patient is doing satisfactorily with treatment.  PLAN: We will continue with the patient's radiation treatment as planned. The patient will finish her treatment on Monday and then followup in our clinic one month.

## 2013-05-23 ENCOUNTER — Ambulatory Visit: Payer: Medicare Other

## 2013-05-23 ENCOUNTER — Encounter: Payer: Self-pay | Admitting: Radiation Oncology

## 2013-05-23 ENCOUNTER — Ambulatory Visit
Admission: RE | Admit: 2013-05-23 | Discharge: 2013-05-23 | Disposition: A | Discharge status: Home / Self Care | Payer: Medicare Other | Source: Ambulatory Visit | Attending: Radiation Oncology | Admitting: Radiation Oncology

## 2013-05-23 DIAGNOSIS — C779 Secondary and unspecified malignant neoplasm of lymph node, unspecified: Secondary | ICD-10-CM

## 2013-05-24 ENCOUNTER — Ambulatory Visit: Payer: Medicare Other

## 2013-05-27 MED ORDER — RADIAPLEXRX EX GEL
Freq: Once | CUTANEOUS | Status: AC
  Administered 2013-05-27: 1 via TOPICAL

## 2013-06-16 ENCOUNTER — Ambulatory Visit (HOSPITAL_BASED_OUTPATIENT_CLINIC_OR_DEPARTMENT_OTHER): Payer: Medicare Other | Admitting: Oncology

## 2013-06-16 ENCOUNTER — Other Ambulatory Visit (HOSPITAL_BASED_OUTPATIENT_CLINIC_OR_DEPARTMENT_OTHER): Payer: Medicare Other

## 2013-06-16 ENCOUNTER — Telehealth: Payer: Self-pay | Admitting: Physician Assistant

## 2013-06-16 VITALS — BP 157/67 | HR 79 | Temp 98.8°F | Resp 18 | Ht 67.0 in | Wt 233.1 lb

## 2013-06-16 DIAGNOSIS — C770 Secondary and unspecified malignant neoplasm of lymph nodes of head, face and neck: Secondary | ICD-10-CM

## 2013-06-16 DIAGNOSIS — C50919 Malignant neoplasm of unspecified site of unspecified female breast: Secondary | ICD-10-CM

## 2013-06-16 DIAGNOSIS — C7989 Secondary malignant neoplasm of other specified sites: Secondary | ICD-10-CM

## 2013-06-16 DIAGNOSIS — E119 Type 2 diabetes mellitus without complications: Secondary | ICD-10-CM

## 2013-06-16 DIAGNOSIS — C773 Secondary and unspecified malignant neoplasm of axilla and upper limb lymph nodes: Secondary | ICD-10-CM

## 2013-06-16 DIAGNOSIS — C779 Secondary and unspecified malignant neoplasm of lymph node, unspecified: Secondary | ICD-10-CM

## 2013-06-16 DIAGNOSIS — C50912 Malignant neoplasm of unspecified site of left female breast: Secondary | ICD-10-CM

## 2013-06-16 DIAGNOSIS — C50312 Malignant neoplasm of lower-inner quadrant of left female breast: Secondary | ICD-10-CM | POA: Insufficient documentation

## 2013-06-16 DIAGNOSIS — Z170 Estrogen receptor positive status [ER+]: Secondary | ICD-10-CM

## 2013-06-16 LAB — COMPREHENSIVE METABOLIC PANEL (CC13)
ALK PHOS: 66 U/L (ref 40–150)
ALT: 10 U/L (ref 0–55)
ANION GAP: 9 meq/L (ref 3–11)
AST: 14 U/L (ref 5–34)
Albumin: 3.6 g/dL (ref 3.5–5.0)
BILIRUBIN TOTAL: 0.32 mg/dL (ref 0.20–1.20)
BUN: 17.2 mg/dL (ref 7.0–26.0)
CO2: 27 meq/L (ref 22–29)
CREATININE: 0.9 mg/dL (ref 0.6–1.1)
Calcium: 9.6 mg/dL (ref 8.4–10.4)
Chloride: 107 mEq/L (ref 98–109)
Glucose: 195 mg/dl — ABNORMAL HIGH (ref 70–140)
Potassium: 3.9 mEq/L (ref 3.5–5.1)
Sodium: 143 mEq/L (ref 136–145)
Total Protein: 6.8 g/dL (ref 6.4–8.3)

## 2013-06-16 LAB — CBC WITH DIFFERENTIAL/PLATELET
BASO%: 0.8 % (ref 0.0–2.0)
Basophils Absolute: 0 10*3/uL (ref 0.0–0.1)
EOS%: 4.6 % (ref 0.0–7.0)
Eosinophils Absolute: 0.2 10*3/uL (ref 0.0–0.5)
HCT: 36.6 % (ref 34.8–46.6)
HGB: 11.5 g/dL — ABNORMAL LOW (ref 11.6–15.9)
LYMPH%: 22.2 % (ref 14.0–49.7)
MCH: 24.5 pg — AB (ref 25.1–34.0)
MCHC: 31.4 g/dL — AB (ref 31.5–36.0)
MCV: 78 fL — AB (ref 79.5–101.0)
MONO#: 0.4 10*3/uL (ref 0.1–0.9)
MONO%: 7.3 % (ref 0.0–14.0)
NEUT#: 3.3 10*3/uL (ref 1.5–6.5)
NEUT%: 65.1 % (ref 38.4–76.8)
PLATELETS: 216 10*3/uL (ref 145–400)
RBC: 4.69 10*6/uL (ref 3.70–5.45)
RDW: 15.7 % — ABNORMAL HIGH (ref 11.2–14.5)
WBC: 5 10*3/uL (ref 3.9–10.3)
lymph#: 1.1 10*3/uL (ref 0.9–3.3)

## 2013-06-16 NOTE — Progress Notes (Signed)
ID: Tami Schmidt OB: Mar 07, 1933  MR#: [id number]  CSN#:[id number]  PCP: Theressa Millard, MD GYN:   SUCoralie Keens, MD OTHER MD: Kyung Rudd, MD  CHIEF COMPLAINT: "I did well with radiation"  BREAST CANCER HISTORY: Tami Schmidt's history of breast cancer dates back to 1993, when she underwent biopsy of her right breast in [place]. I do not have those details, but she moved to [place] shortly thereafter and it was in [place] where she had her right lumpectomy and axillary lymph node dissection. She had a total of 6 lymph nodes removed, she tells me, and all of them were clear. She does not recall the size of the primary. She was treated with adjuvant radiation and then received tamoxifen between 1993 and 1998.  In 2002 she had a local recurrence in the right breast, and underwent right mastectomy. She received tamoxifen between 2002 and 2007.  In 2004 she underwent a left mastectomy and sentinel lymph node sampling for a 9 mm mucinous carcinoma, grade 2, multifocal, associated with ductal carcinoma in situ. The single sentinel lymph node was negative. Margins were ample. She did not receive adjuvant radiation, but continued on tamoxifen as noted above, until 2007.  More recently, in July of 2014, the patient noted a "pimple" in the left chest wall, superior to her prior mastectomy incision. She brought this to the attention of her surgeon, Dr. Ninfa Linden and he describes a dimple area with a firm mass underneath. He also describes a large lipoma in the left clavicular area and a second one on her abdominal wall. Both of these were symptomatic. All 3 masses were removed 01/26/2013. 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 The lesion abutted the closest inked resection margin. The other 2 masses proved to be lipomas.  The patient's subsequent history is as detailed below  INTERVAL HISTORY: Tami Schmidt returns  today for followup of her breast cancer. Since her last visit here she completed radiation therapy. She tolerated it well, with some fatigue (she feels she has three fourths recovered) and some desquamation, which has largely resolved. The anastrozole was interrupted during her radiation treatments  REVIEW OF SYSTEMS: Tami Schmidt denies unusual headaches, visual changes, nausea, vomiting, difficulty swallowing, cough, phlegm production, pleurisy, or change in bowel or bladder habits. She denies chest wall pain. She feels the left upper extremity lymphedema has increased over the last month, but it is not disabling (grade 1 at this point). A detailed review of systems today was otherwise noncontributory  PAST MEDICAL HISTORY: Past Medical History  Diagnosis Date  . Hypertension   . Cancer     breast  . Arthritis   . Breast cancer     b/l mastectomies hx  . Diabetes mellitus     fasting 90-100  . Hypercholesterolemia   . Hx of radiation therapy     breasts hx  . Type II or unspecified type diabetes mellitus without mention of complication, not stated as uncontrolled 03/25/2013  . HTN (hypertension) 03/25/2013  . 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 Abdominal hysterectomy    . Hernia repair  04-26-2010  . Breast surgery Bilateral     mastectomy  . Appendectomy    . Ankle surgery Right 1995  . Mass excision Left 01/26/2013    Procedure: EXCISION LEFT CHEST WALL MASS AND LEFT ABDOMNAL WALL MASS;  Surgeon: Harl Bowie, MD;  Location: [facility];  Service: General;  Laterality: Left;    FAMILY HISTORY The patient's father died from a heart attack at the age of 89. The patient's mother died at the age of 20 from myocarditis. The patient had no brothers, one sister. There is no history of breast or ovarian cancer in the family to her knowledge  GYNECOLOGIC HISTORY:  Menarche age 65, first live birth  age 20, the patient is Tami Schmidt P5. She underwent hysterectomy in 1984. She did not use hormone replacement.  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 Her husband Radiographer, therapeutic used to work in Radio [employer] but is now retired. Their children are: Reed Breech who lives in [place] and teaches theater there; Julius Bowels who works as a Social worker in [place]; Archivist who lives in [place] and is a "radio personality"; Elta Guadeloupe, who is a Biomedical scientist in [place], and Legrand Como who is a musician living in [place]. The patient has 2 grandchildren. She attends a [employer]    ADVANCED DIRECTIVES: Not in place   HEALTH MAINTENANCE:  (Updated 03/25/2013) History  Substance Use Topics  . Smoking status: Former Smoker    Types: Cigarettes    Quit date: 04/14/1972  . Smokeless tobacco: Never Used  . Alcohol Use: Yes     Comment: occasional    Colonoscopy: 2011  PAP: Status post hysterectomy  Bone density: 1993  Lipid panel: not on file/Dr. Arnoldo Morale  Allergies  Allergen Reactions  . Bactrim Other (See Comments)    Swelling to the mouth and face.  . Lisinopril Other (See Comments)    Swelling to the mouth and face.  Michail Sermon Other (See Comments)    Swelling to the mouth and face.    Current Outpatient Prescriptions  Medication Sig Dispense Refill  . amLODipine (NORVASC) 10 MG tablet Take 10 mg by mouth daily.      Marland Kitchen anastrozole (ARIMIDEX) 1 MG tablet Take 1 tablet (1 mg total) by mouth daily.  90 tablet  12  . Choline Fenofibrate (FENOFIBRIC ACID) 135 MG CPDR Take 1 tablet by mouth daily.       . fish oil-omega-3 fatty acids 1000 MG capsule Take 2 g by mouth daily.      . hyaluronate sodium (RADIAPLEXRX) GEL Apply 1 application topically 2 (two) times daily. 3rd tube given 05/20/13      . Insulin Isophane Human (HUMULIN N [place]) Inject 20-40 Units into the skin every morning. 40 units in the am and 20 units in the pm      . losartan-hydrochlorothiazide (HYZAAR) 100-25 MG per tablet Take 1  tablet by mouth daily.       . metFORMIN (GLUCOPHAGE) 500 MG tablet Take 500 mg by mouth daily with breakfast.      . non-metallic deodorant (ALRA) MISC Apply 1 application topically daily as needed.       No current facility-administered medications for this visit.    OBJECTIVE: Older African American woman in no acute distress Filed Vitals:   06/16/13 1156  BP: 157/67  Pulse: 79  Temp: 98.8 F (37.1 C)  Resp: 18     Body mass index is 36.5 kg/(m^2).    ECOG FS: 1 Filed Weights   06/16/13 1156  Weight: 233 lb 1.6 oz (105.733 kg)   Sclerae unicteric, EOMs intact Oropharynx clear and moist I do not palpate any well-defined left cervical or supraclavicular adenopathy Lungs no rales or rhonchi Heart regular  rate and rhythm Abd soft, nontender, positive bowel sounds MSK no focal spinal tenderness, left upper extremity lymphedema, without erythema Neuro: nonfocal, well oriented, appropriate affect Breasts: Status post bilateral mastectomies. There is hyperpigmentation or the radiation port area. There is no evidence of active disease on the chest wall at present. Both axillae are benign  LAB RESULTS:  Lab Results  Component Value Date   WBC 5.0 06/16/2013   NEUTROABS 3.3 06/16/2013   HGB 11.5* 06/16/2013   HCT 36.6 06/16/2013   MCV 78.0* 06/16/2013   PLT 216 06/16/2013      Chemistry      Component Value Date/Time   NA 143 06/16/2013 1133   NA 139 01/21/2013 1028   K 3.9 06/16/2013 1133   K 3.6 01/21/2013 1028   CL 101 01/21/2013 1028   CO2 27 06/16/2013 1133   CO2 24 01/21/2013 1028   BUN 17.2 06/16/2013 1133   BUN 16 01/21/2013 1028   CREATININE 0.9 06/16/2013 1133   CREATININE 0.77 01/21/2013 1028   CREATININE 0.74 06/14/2012 1257      Component Value Date/Time   CALCIUM 9.6 06/16/2013 1133   CALCIUM 9.2 01/21/2013 1028   ALKPHOS 66 06/16/2013 1133   ALKPHOS 86 04/20/2010 0942   AST 14 06/16/2013 1133   AST 21 04/20/2010 0942   ALT 10 06/16/2013 1133   ALT 18 04/20/2010 0942   BILITOT 0.32  06/16/2013 1133   BILITOT 0.5 04/20/2010 0942       STUDIES: No results found.   ASSESSMENT: 80 y.o. [place] woman  (1) status post right lumpectomy and axillary dissection in 1993 in [place] for what appears to have been a stage I breast cancer, treated with radiation and then tamoxifen for 5 years  (2) status post right mastectomy 2002 for a recurrence in the right breast, treated adjuvantly with tamoxifen for an additional 5 years  (3) status post left mastectomy and sentinel lymph node sampling 03/07/2003 for a pT1b, pN0, stage IA mucinous breast cancer, grade 2; no radiation therapy was given. Tamoxifen was continued until 2009  (4) excisional biopsy of a left chest wall mass 01/26/2013 showing carcinoma consistent with invasive ductal carcinoma, estrogen receptor 100% positive, progesterone receptor 0% positive, with an MIB-1 of 22% and no HER-2 amplification.   (5)  PET scan on 03/03/2013 confirmed extensive metastatic adenopathy in the left axilla, left subpectoral musculature, and left supraclavicular region  (6)  started on anastrozole daily beginning 03/01/2013, interrupted during radiation therapy, resumed March 2015  (7) radiation therapy completed 05/23/2013  PLAN: Tami Schmidt tolerated her radiation therapy without unusual complications. She does have increased left upper extremity lymphedema, and today I wrote her for a sleeve. If she has any trouble obtaining that she will let us know.  She is now ready to go back on her anastrozole. We discussed the possible toxicities, side effects and complications, and if she does indeed tolerated well the plan will be to repeat a DEXA scan later this year. She she did take it for approximately one month late last year, with no side effects that she is aware of. She is only having to pay $3 a month for that, which is very favorable.  The plan is for her to return in 3 months. We will repeat a PET scan before that visit. Assuming it is  "quieted", the plan will be to continue anastrozole indefinitely, so long as there is no further evidence of disease recurrence or progression. The patient is a  good understanding of this plan, and agrees with that. She will call with any problems that may develop before the next visit.  Chauncey Cruel, MD   06/16/2013 12:41 PM

## 2013-06-18 ENCOUNTER — Encounter: Payer: Self-pay | Admitting: *Deleted

## 2013-06-23 ENCOUNTER — Encounter: Payer: Self-pay | Admitting: Radiation Oncology

## 2013-06-23 ENCOUNTER — Ambulatory Visit
Admission: RE | Admit: 2013-06-23 | Discharge: 2013-06-23 | Disposition: A | Discharge status: Home / Self Care | Payer: Medicare Other | Source: Ambulatory Visit | Attending: Radiation Oncology | Admitting: Radiation Oncology

## 2013-06-23 VITALS — BP 143/78 | HR 71 | Temp 97.8°F | Resp 20 | Ht 67.0 in | Wt 235.0 lb

## 2013-06-23 DIAGNOSIS — C50919 Malignant neoplasm of unspecified site of unspecified female breast: Secondary | ICD-10-CM

## 2013-06-23 DIAGNOSIS — C7989 Secondary malignant neoplasm of other specified sites: Principal | ICD-10-CM

## 2013-06-23 NOTE — Progress Notes (Signed)
Radiation Oncology         [phone] ________________________________  Name: Tami Schmidt MRN: [id number]  Date: 06/23/2013  DOB: [birth year]  Follow-Up Visit Note  CC: Theressa Millard, MD  Harl Bowie, MD  Diagnosis:   Left-sided breast cancer  Interval Since Last Radiation:  Approximately one month   Narrative:  The patient returns today for routine follow-up.  She has done well overall since she finished treatment. The patient's skin has healed significantly since she completed her course of radiation treatment. She has begun anti-hormonal treatment through Dr. Jana Hakim.                              ALLERGIES:  is allergic to bactrim; lisinopril; and vasotec.  Meds: Current Outpatient Prescriptions  Medication Sig Dispense Refill  . amLODipine (NORVASC) 10 MG tablet Take 10 mg by mouth daily.      Marland Kitchen anastrozole (ARIMIDEX) 1 MG tablet Take 1 tablet (1 mg total) by mouth daily.  90 tablet  12  . Choline Fenofibrate (FENOFIBRIC ACID) 135 MG CPDR Take 1 tablet by mouth daily.       . fish oil-omega-3 fatty acids 1000 MG capsule Take 2 g by mouth daily.      . Insulin Isophane Human (HUMULIN N [place]) Inject 20-40 Units into the skin every morning. 40 units in the am and 20 units in the pm      . losartan-hydrochlorothiazide (HYZAAR) 100-25 MG per tablet Take 1 tablet by mouth daily.       . metFORMIN (GLUCOPHAGE) 500 MG tablet Take 500 mg by mouth daily with breakfast.       No current facility-administered medications for this encounter.    Physical Findings: The patient is in no acute distress. Patient is alert and oriented.  height is 5\' 7"  (1.702 m) and weight is 235 lb (106.595 kg). Her oral temperature is 97.8 F (36.6 C). Her blood pressure is 143/78 and her pulse is 71. Her respiration is 20. .   The skin in the treatment area has healed satisfactorily, no areas of concern/moist desquamation/poor healing.  Residual hyperpigmentation in the treatment area which  will save in the coming months.   Lab Findings: Lab Results  Component Value Date   WBC 5.0 06/16/2013   HGB 11.5* 06/16/2013   HCT 36.6 06/16/2013   MCV 78.0* 06/16/2013   PLT 216 06/16/2013     Radiographic Findings: No results found.  Impression:    The patient has done satisfactorily since finishing treatment. She has begun anti-hormonal treatment through Dr. Jana Hakim. No role for radiation treatment in the foreseeable future. I discussed followup options with her including ongoing followup or on an as-needed basis. She indicated that she preferred to hold off on scheduling a definite followup at this time. She knows to contact our off this if we can be of any further assistance.  Plan:  The patient will followup in our clinic on a when necessary basis.   Jodelle Gross, M.D., Ph.D.

## 2013-06-23 NOTE — Progress Notes (Signed)
Follow up left breast  Rad tx completd 05/23/13, well healed, stuill some discororatin, using cetaphil lotion , no c/o pain, or discomfort, appetite good, energy level same , okay,  4:02 PM

## 2013-06-26 NOTE — Addendum Note (Signed)
Encounter addended by: Marye Round, MD on: 06/26/2013 10:22 PM<BR>     Documentation filed: Notes Section

## 2013-06-26 NOTE — Progress Notes (Signed)
  Radiation Oncology         [phone]) [phone] ________________________________  Name: Tami Schmidt MRN: [id number]  Date: 03/23/2013  DOB: [birth year]  RESPIRATORY MOTION MANAGEMENT SIMULATION  NARRATIVE:  In order to account for effect of respiratory motion on target structures and other organs in the planning and delivery of radiotherapy, this patient underwent respiratory motion management simulation.  To accomplish this, when the patient was brought to the CT simulation planning suite, 4D respiratoy motion management CT images were obtained.  The CT images were loaded into the planning software.  Then, using a variety of tools including Cine, MIP, and standard views, the target volume and planning target volumes (PTV) were delineated.  Avoidance structures were contoured.  Treatment planning then occurred.  Dose volume histograms were generated and reviewed for each of the requested structure.  The resulting plan was carefully reviewed and approved today.  ------------------------------------------------  Jodelle Gross, MD, PhD

## 2013-06-26 NOTE — Progress Notes (Signed)
  Radiation Oncology         [phone] ________________________________  Name: Tami Schmidt MRN: [id number]  Date: 05/23/2013  DOB: [birth year]  End of Treatment Note  Diagnosis:   Invasive ductal carcinoma of the left breast     Indication for treatment:  Palliative        Radiation treatment dates:   03/31/2013 through 05/23/2013  Site/dose:   The patient was treated initially with a forward treatment planning technique to the left chest wall in addition to treatment to the supraclavicular region. This consisted of a 3-D conformal technique. The patient was treated in this fashion to a dose of 50.4 gray. The patient then received a 14 gray boost treatment using an electron field. The total dose was 64.4 gray.  Narrative: The patient tolerated radiation treatment relatively well.   Skin irritation was present at the end of treatment, especially in the left supraclavicular region. Hyperpigmentation was present.  Plan: The patient has completed radiation treatment. The patient will return to radiation oncology clinic for routine followup in one month. I advised the patient to call or return sooner if they have any questions or concerns related to their recovery or treatment. ________________________________  Jodelle Gross, M.D., Ph.D.

## 2013-07-21 ENCOUNTER — Telehealth: Payer: Self-pay | Admitting: Physician Assistant

## 2013-07-21 NOTE — Telephone Encounter (Signed)
per AB to resch for day before 12th or after. resch appt/cld pt & left message to adv of new sch date & time

## 2013-08-01 ENCOUNTER — Telehealth: Payer: Self-pay | Admitting: Oncology

## 2013-08-01 NOTE — Telephone Encounter (Signed)
pt called today to confirm message she received re changing appts. confirmed appts for 6/5 lb/pet and 6/11 AB.

## 2013-08-02 DIAGNOSIS — L821 Other seborrheic keratosis: Secondary | ICD-10-CM | POA: Insufficient documentation

## 2013-08-02 DIAGNOSIS — L669 Cicatricial alopecia, unspecified: Secondary | ICD-10-CM | POA: Insufficient documentation

## 2013-08-02 DIAGNOSIS — L708 Other acne: Secondary | ICD-10-CM | POA: Insufficient documentation

## 2013-08-02 DIAGNOSIS — L905 Scar conditions and fibrosis of skin: Secondary | ICD-10-CM | POA: Insufficient documentation

## 2013-08-02 DIAGNOSIS — L658 Other specified nonscarring hair loss: Secondary | ICD-10-CM | POA: Insufficient documentation

## 2013-08-02 DIAGNOSIS — D239 Other benign neoplasm of skin, unspecified: Secondary | ICD-10-CM | POA: Insufficient documentation

## 2013-09-16 ENCOUNTER — Ambulatory Visit (HOSPITAL_COMMUNITY)
Admission: RE | Admit: 2013-09-16 | Discharge: 2013-09-16 | Disposition: A | Discharge status: Home / Self Care | Payer: Medicare Other | Source: Ambulatory Visit | Attending: Oncology | Admitting: Oncology

## 2013-09-16 ENCOUNTER — Other Ambulatory Visit (HOSPITAL_BASED_OUTPATIENT_CLINIC_OR_DEPARTMENT_OTHER): Payer: Medicare Other

## 2013-09-16 DIAGNOSIS — C50919 Malignant neoplasm of unspecified site of unspecified female breast: Secondary | ICD-10-CM

## 2013-09-16 DIAGNOSIS — C50912 Malignant neoplasm of unspecified site of left female breast: Secondary | ICD-10-CM

## 2013-09-16 DIAGNOSIS — I700 Atherosclerosis of aorta: Secondary | ICD-10-CM | POA: Insufficient documentation

## 2013-09-16 DIAGNOSIS — C779 Secondary and unspecified malignant neoplasm of lymph node, unspecified: Secondary | ICD-10-CM

## 2013-09-16 DIAGNOSIS — R599 Enlarged lymph nodes, unspecified: Secondary | ICD-10-CM | POA: Insufficient documentation

## 2013-09-16 DIAGNOSIS — I517 Cardiomegaly: Secondary | ICD-10-CM | POA: Insufficient documentation

## 2013-09-16 LAB — CBC WITH DIFFERENTIAL/PLATELET
BASO%: 0.4 % (ref 0.0–2.0)
BASOS ABS: 0 10*3/uL (ref 0.0–0.1)
EOS%: 5.3 % (ref 0.0–7.0)
Eosinophils Absolute: 0.3 10*3/uL (ref 0.0–0.5)
HEMATOCRIT: 37.6 % (ref 34.8–46.6)
HEMOGLOBIN: 12.1 g/dL (ref 11.6–15.9)
LYMPH%: 23.8 % (ref 14.0–49.7)
MCH: 24.9 pg — AB (ref 25.1–34.0)
MCHC: 32.2 g/dL (ref 31.5–36.0)
MCV: 77.4 fL — ABNORMAL LOW (ref 79.5–101.0)
MONO#: 0.4 10*3/uL (ref 0.1–0.9)
MONO%: 7.6 % (ref 0.0–14.0)
NEUT#: 3.3 10*3/uL (ref 1.5–6.5)
NEUT%: 62.9 % (ref 38.4–76.8)
PLATELETS: 230 10*3/uL (ref 145–400)
RBC: 4.86 10*6/uL (ref 3.70–5.45)
RDW: 15.3 % — ABNORMAL HIGH (ref 11.2–14.5)
WBC: 5.3 10*3/uL (ref 3.9–10.3)
lymph#: 1.3 10*3/uL (ref 0.9–3.3)
nRBC: 0 % (ref 0–0)

## 2013-09-16 LAB — COMPREHENSIVE METABOLIC PANEL (CC13)
ALK PHOS: 64 U/L (ref 40–150)
ALT: 9 U/L (ref 0–55)
AST: 11 U/L (ref 5–34)
Albumin: 3.7 g/dL (ref 3.5–5.0)
Anion Gap: 15 mEq/L — ABNORMAL HIGH (ref 3–11)
BUN: 15.7 mg/dL (ref 7.0–26.0)
CALCIUM: 9.3 mg/dL (ref 8.4–10.4)
CHLORIDE: 108 meq/L (ref 98–109)
CO2: 19 mEq/L — ABNORMAL LOW (ref 22–29)
Creatinine: 0.7 mg/dL (ref 0.6–1.1)
GLUCOSE: 121 mg/dL (ref 70–140)
Potassium: 3.5 mEq/L (ref 3.5–5.1)
Sodium: 142 mEq/L (ref 136–145)
Total Bilirubin: 0.46 mg/dL (ref 0.20–1.20)
Total Protein: 7.1 g/dL (ref 6.4–8.3)

## 2013-09-16 LAB — GLUCOSE, CAPILLARY: GLUCOSE-CAPILLARY: 123 mg/dL — AB (ref 70–99)

## 2013-09-16 MED ORDER — FLUDEOXYGLUCOSE F - 18 (FDG) INJECTION
12.5000 | Freq: Once | INTRAVENOUS | Status: AC | PRN
  Administered 2013-09-16: 12.5 via INTRAVENOUS

## 2013-09-22 ENCOUNTER — Telehealth: Payer: Self-pay | Admitting: Oncology

## 2013-09-22 ENCOUNTER — Ambulatory Visit (HOSPITAL_BASED_OUTPATIENT_CLINIC_OR_DEPARTMENT_OTHER): Payer: Medicare Other | Admitting: Physician Assistant

## 2013-09-22 ENCOUNTER — Encounter: Payer: Self-pay | Admitting: Physician Assistant

## 2013-09-22 VITALS — BP 177/67 | HR 84 | Temp 98.0°F | Resp 18 | Ht 67.0 in | Wt 232.9 lb

## 2013-09-22 DIAGNOSIS — Z780 Asymptomatic menopausal state: Secondary | ICD-10-CM

## 2013-09-22 DIAGNOSIS — C779 Secondary and unspecified malignant neoplasm of lymph node, unspecified: Secondary | ICD-10-CM

## 2013-09-22 DIAGNOSIS — C7989 Secondary malignant neoplasm of other specified sites: Secondary | ICD-10-CM

## 2013-09-22 DIAGNOSIS — Z170 Estrogen receptor positive status [ER+]: Secondary | ICD-10-CM

## 2013-09-22 DIAGNOSIS — G4700 Insomnia, unspecified: Secondary | ICD-10-CM

## 2013-09-22 DIAGNOSIS — C50919 Malignant neoplasm of unspecified site of unspecified female breast: Secondary | ICD-10-CM

## 2013-09-22 DIAGNOSIS — C50912 Malignant neoplasm of unspecified site of left female breast: Secondary | ICD-10-CM

## 2013-09-22 DIAGNOSIS — C773 Secondary and unspecified malignant neoplasm of axilla and upper limb lymph nodes: Secondary | ICD-10-CM

## 2013-09-22 MED ORDER — ANASTROZOLE 1 MG PO TABS: 1.0000 mg | ORAL_TABLET | Freq: Every day | ORAL | Status: DC

## 2013-09-22 NOTE — Progress Notes (Signed)
ID: [name] OB: [birth year]  MR#: [id number]  CSN#:[id number]  PCP: Lynne Logan, MD GYN:   SUCoralie Keens, MD OTHER MD: Kyung Rudd, MD  CHIEF COMPLAINT:  1)  Hx Right Breast Cancer    2)  Hx Left Breast Cancer, with recurrence and metastatic adenopathy (anastrazole)  BREAST CANCER HISTORY: Justene's history of breast cancer dates back to 1993, when she underwent biopsy of her right breast in [place]. I do not have those details, but she moved to [place] shortly thereafter and it was in [place] where she had her right lumpectomy and axillary lymph node dissection. She had a total of 6 lymph nodes removed, she tells me, and all of them were clear. She does not recall the size of the primary. She was treated with adjuvant radiation and then received tamoxifen between 1993 and 1998.  In 2002 she had a local recurrence in the right breast, and underwent right mastectomy. She received tamoxifen between 2002 and 2007.  In 2004 she underwent a left mastectomy and sentinel lymph node sampling for a 9 mm mucinous carcinoma, grade 2, multifocal, associated with ductal carcinoma in situ. The single sentinel lymph node was negative. Margins were ample. She did not receive adjuvant radiation, but continued on tamoxifen as noted above, until 2007.  More recently, in July of 2014, the patient noted a "pimple" in the left chest wall, superior to her prior mastectomy incision. She brought this to the attention of her surgeon, Dr. Ninfa Linden and he describes a dimple area with a firm mass underneath. He also describes a large lipoma in the left clavicular area and a second one on her abdominal wall. Both of these were symptomatic. All 3 masses were removed 01/26/2013. 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 The lesion abutted the closest inked resection margin. The other 2 masses proved to be lipomas.  The patient's  subsequent history is as detailed below  INTERVAL HISTORY: Loan returns alone today for followup of her recurrent left breast cancer with metastatic adenopathy. She completed radiation therapy in early February, and was started back on anastrozole in March. She has now been on the anastrozole for 3 months, and is tolerating it reasonably well. She does have some occasional night sweats. She has some difficulty sleeping, but attributes this to anxiety. She tells me the anxiety "passes" on its own. She denies any depression or suicidal ideation. Due to the insomnia, she also has fatigue,  She does have chronic joint pain associated with arthritis, but this has not worsened since starting the anastrozole. She denies any vaginal changes, specifically no dryness. She has had some increased constipation since starting the anastrozole, but treats this effectively with prune juice daily.  Interval history is notable for the fact that [facility] just had a repeat PET scan last week which showed significant interval decrease in uptake associated with the adenopathy. There was no new or progressive disease identified within the chest, abdomen, or pelvis. This is fully detailed below.  Ginamarie does point out a "lump" on her left upper chest wall which she tells me has "been there for a while", but which she wanted me to look at today. It is just beneath an old incision where she tells me she had a benign fatty tumor removed remotely.   REVIEW OF SYSTEMS: Cesar denies any recent illnesses and has had no fevers, chills, rashes, skin changes, abnormal bruising, or  bleeding. Her appetite is good, and she denies any problems with nausea or emesis. She's had no change in urinary habits. She denies any cough or phlegm production. She does have some shortness of breath with exertion which is stable. She does "chair aerobics" 3 times weekly for exercise. She has mild lymphedema in the left upper extremity, but this appears to  be well-controlled. She's had no additional swelling and also denies any chest pain or palpitations. She's had no abnormal headaches, dizziness, or change in vision.  A detailed review of systems is otherwise stable and noncontributory.   PAST MEDICAL HISTORY: Past Medical History  Diagnosis Date  . Hypertension   . Cancer     breast  . Arthritis   . Breast cancer     b/l mastectomies hx  . Diabetes mellitus     fasting 90-100  . Hypercholesterolemia   . Hx of radiation therapy     breasts hx  . Type II or unspecified type diabetes mellitus without mention of complication, not stated as uncontrolled 03/25/2013  . HTN (hypertension) 03/25/2013  . 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 Abdominal hysterectomy    . Hernia repair  04-26-2010  . Breast surgery Bilateral     mastectomy  . Appendectomy    . Ankle surgery Right 1995  . Mass excision Left 01/26/2013    Procedure: EXCISION LEFT CHEST WALL MASS AND LEFT ABDOMNAL WALL MASS;  Surgeon: Harl Bowie, MD;  Location: [facility];  Service: General;  Laterality: Left;    FAMILY HISTORY The patient's father died from a heart attack at the age of 54. The patient's mother died at the age of 76 from myocarditis. The patient had no brothers, one sister. There is no history of breast or ovarian cancer in the family to her knowledge  GYNECOLOGIC HISTORY:   (Reviewed 09/22/2013) Menarche age 14, first live birth age 71, the patient is [place] P5. She underwent hysterectomy in 1984. She did not use hormone replacement.  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 Her husband Radiographer, therapeutic used to work in Radio [employer] but is now retired. Their children are: Reed Breech who lives in [place] and teaches theater there; Julius Bowels who works as a Social worker in [place]; Archivist who lives in [place] and is a "radio  personality"; Elta Guadeloupe, who is a Biomedical scientist in [place], and Legrand Como who is a musician living in [place]. The patient has 2 grandchildren. She attends a [employer]    ADVANCED DIRECTIVES: Not in place   HEALTH MAINTENANCE:  (Updated 09/22/2013) History  Substance Use Topics  . Smoking status: Former Smoker    Types: Cigarettes    Quit date: 04/14/1972  . Smokeless tobacco: Never Used  . Alcohol Use: Yes     Comment: occasional    Colonoscopy: 2011  PAP: Status post hysterectomy  Bone density: 1993 - to be repeated in 2015  Lipid panel: not on file  Allergies  Allergen Reactions  . Bactrim Other (See Comments)    Swelling to the mouth and face.  . Lisinopril Other (See Comments)    Swelling to the mouth and face.  Michail Sermon Other (See Comments)    Swelling to the mouth and face.    Current Outpatient Prescriptions  Medication Sig Dispense Refill  . amLODipine (NORVASC) 10 MG tablet Take 10 mg by mouth daily.      Marland Kitchen  anastrozole (ARIMIDEX) 1 MG tablet Take 1 tablet (1 mg total) by mouth daily.  90 tablet  3  . Choline Fenofibrate (FENOFIBRIC ACID) 135 MG CPDR Take 1 tablet by mouth daily.       . fish oil-omega-3 fatty acids 1000 MG capsule Take 2 g by mouth daily.      . Insulin Isophane Human (HUMULIN N [place]) Inject 20-40 Units into the skin every morning. 40 units in the am and 20 units in the pm      . losartan-hydrochlorothiazide (HYZAAR) 100-25 MG per tablet Take 1 tablet by mouth daily.       . metFORMIN (GLUCOPHAGE) 500 MG tablet Take 500 mg by mouth daily with breakfast.      . HYDROcodone-acetaminophen (NORCO/VICODIN) 5-325 MG per tablet        No current facility-administered medications for this visit.    OBJECTIVE: Older African American woman who appears comfortable and is in no acute distress Filed Vitals:   09/22/13 1122  BP: 177/67  Pulse: 84  Temp: 98 F (36.7 C)  Resp: 18     Body mass index is 36.47 kg/(m^2).    ECOG FS: 1 Filed Weights   09/22/13  1122  Weight: 232 lb 14.4 oz (105.643 kg)   Physical Exam: HEENT:  Sclerae anicteric.  Oropharynx clear an moist. Neck supple, trachea midline. No thyromegaly palpated.  NODES:  No cervical or supraclavicular lymphadenopathy palpated.  BREAST EXAM:  Patient is status post bilateral mastectomies. There is hyperpigmentation over the left chest wall status post recent radiation therapy. No additional skin changes are noted. In the upper left chest wall there is a soft, movable mass, just beneath an old incision. This measures approximately 1.5 cm, and is consistent with either scar tissue, fatty tissue, or the hypermetabolic mass noted on the PET scan as being located in the "medial left chest wall", currently measuring 1.7 x 2.8 cm. No additional nodules or masses are palpated. Axillae are benign, with no palpable adenopathy. LUNGS:  Clear to auscultation bilaterally with good excursion.  No wheezes or rhonchi HEART:  Regular rate and rhythm. No murmur  ABDOMEN:  Soft, obese, nontender.  Positive bowel sounds.  MSK:  No focal spinal tenderness to palpation. Good range of motion bilaterally in the upper extremities. EXTREMITIES:  No peripheral edema, other than slight nonpitting edema noted in the left upper extremity today.   SKIN:  No visible rashes. No ecchymoses or petechiae. Skin is warm and dry. Good skin turgor. NEURO:  Nonfocal. Well oriented.  Appropriate affect.   LAB RESULTS:  Lab Results  Component Value Date   WBC 5.3 09/16/2013   NEUTROABS 3.3 09/16/2013   HGB 12.1 09/16/2013   HCT 37.6 09/16/2013   MCV 77.4* 09/16/2013   PLT 230 09/16/2013      Chemistry      Component Value Date/Time   NA 142 09/16/2013 0924   NA 139 01/21/2013 1028   K 3.5 09/16/2013 0924   K 3.6 01/21/2013 1028   CL 101 01/21/2013 1028   CO2 19* 09/16/2013 0924   CO2 24 01/21/2013 1028   BUN 15.7 09/16/2013 0924   BUN 16 01/21/2013 1028   CREATININE 0.7 09/16/2013 0924   CREATININE 0.77 01/21/2013 1028   CREATININE  0.74 06/14/2012 1257      Component Value Date/Time   CALCIUM 9.3 09/16/2013 0924   CALCIUM 9.2 01/21/2013 1028   ALKPHOS 64 09/16/2013 0924   ALKPHOS 86 04/20/2010 0942  AST 11 09/16/2013 0924   AST 21 04/20/2010 0942   ALT 9 09/16/2013 0924   ALT 18 04/20/2010 0942   BILITOT 0.46 09/16/2013 0924   BILITOT 0.5 04/20/2010 0942       STUDIES:  Nm Pet Image Restag (ps) Skull Base To Thigh  09/16/2013   CLINICAL DATA:  Subsequent treatment strategy for breast cancer.  EXAM: NUCLEAR MEDICINE PET SKULL BASE TO THIGH  TECHNIQUE: 12.5 mCi F-18 FDG was injected intravenously. Full-ring PET imaging was performed from the skull base to thigh after the radiotracer. CT data was obtained and used for attenuation correction and anatomic localization.  FASTING BLOOD GLUCOSE:  Value: 123 mg/dl  COMPARISON:  03/03/2013  FINDINGS: NECK  No hypermetabolic lymph nodes in the neck.  CHEST  Again noted are postoperative changes and changes from external beam radiation within the left chest wall. There is associated low level FDG uptake within this area compatible with inflammation. Previous hypermetabolic mass within the medial left chest wall measures 1.6 x 2.2 cm and has an SUV max equal to 2.4. Previously this this measured 1.7 x 2.8 cm and had an SUV max equal to 6.0. Index left supraclavicular lymph node measures 1 cm, image 39/series 4. This has an SUV max equal to 1.5. Previously this measured 1.1 cm and had an SUV max equal to 6.4. Left subpectoral and left axillary adenopathy measures 5.2 x 2.9 cm and has an SUV max equal to 2.2. Previously this measured 5.4 x 3.3 cm and had an SUV max equal to 8.1.  There is cardiac enlargement. Calcified atherosclerotic disease involves the thoracic aorta as well as the LAD, left circumflex coronary arteries. No hypermetabolic mediastinal or hilar nodes. No suspicious pulmonary nodules on the CT scan. Interval development of radiation change along the subpleural anterior left upper lobe. No  suspicious pulmonary nodule or mass identified.  ABDOMEN/PELVIS  No abnormal hypermetabolic activity within the liver, pancreas, adrenal glands, or spleen. No hypermetabolic lymph nodes in the abdomen or pelvis. Again noted is a low ventral pelvic wall hernia containing nonobstructed small bowel and colon. Atherosclerotic disease involves the abdominal aorta.  SKELETON  No focal hypermetabolic activity to suggest skeletal metastasis.  IMPRESSION: 1. Interval response to therapy. There has been significant interval decrease in FDG uptake associated with left axillary, left subpectoral and left supraclavicular adenopathy. 2. No new or progressive disease identified within the chest abdomen or pelvis. 3. Low ventral pelvic wall hernia containing unobstructed small bowel and colon.   Electronically Signed   By: Kerby Moors M.D.   On: 09/16/2013 12:18  And   ASSESSMENT: 80 y.o. [place] woman  (1) status post right lumpectomy and axillary dissection in 1993 in [place] for what appears to have been a stage I breast cancer, treated with radiation and then tamoxifen for 5 years  (2) status post right mastectomy 2002 for a recurrence in the right breast, treated adjuvantly with tamoxifen for an additional 5 years  (3) status post left mastectomy and sentinel lymph node sampling 03/07/2003 for a pT1b, pN0, stage IA mucinous breast cancer, grade 2; no radiation therapy was given. Tamoxifen was continued until 2009  (4) excisional biopsy of a left chest wall mass 01/26/2013 showing carcinoma consistent with invasive ductal carcinoma, estrogen receptor 100% positive, progesterone receptor 0% positive, with an MIB-1 of 22% and no HER-2 amplification.   (5)  PET scan on 03/03/2013 confirmed extensive metastatic adenopathy in the left axilla, left subpectoral musculature, and left supraclavicular  region  (6)  started on anastrozole daily beginning 03/01/2013, interrupted during radiation therapy, resumed March  2015  (7) radiation therapy completed 05/23/2013  PLAN: Yaritzy appears to be tolerating the anastrozole well, and I'm currently making no change in her treatment regimen. I have sent a 90 day prescription to her [facility] pharmacy per her request.   We did discuss the possibility of obtaining an ultrasound of the palpable abnormality in her left chest wall, but of course we did just obtain a PET scan which as detailed above and showed interval response with no new disease. At this time, Henry declines additional studies, but will "keep an eye on this area", and will let us know if it changes or enlarges at all. We will continue to follow her very closely, and we'll see her back for labs and physical exam in 3 months. Prior to that visit, we will also try to obtain a bone density to evaluate for osteoporosis since she will be on an aromatase inhibitor indefinitely.   The above was reviewed in detail with [facility] today. She voices both her understanding and agreement with this plan, and will call with any changes or problems prior to her next appointment.  Darcey Cardy, PA-C   09/22/2013 12:17 PM

## 2013-09-23 ENCOUNTER — Ambulatory Visit: Payer: Medicare Other | Admitting: Physician Assistant

## 2013-09-23 ENCOUNTER — Telehealth: Payer: Self-pay | Admitting: Oncology

## 2013-09-23 NOTE — Telephone Encounter (Signed)
lmonvm for pt re appt for bone density test 9/15 BC and lb/fu sept/oct. 2nd message left for pt correcting d/t/location of bone density test. schedule mailed

## 2013-12-27 ENCOUNTER — Ambulatory Visit
Admission: RE | Admit: 2013-12-27 | Discharge: 2013-12-27 | Disposition: A | Discharge status: Home / Self Care | Payer: Medicare Other | Source: Ambulatory Visit | Attending: Oncology | Admitting: Oncology

## 2013-12-27 DIAGNOSIS — C779 Secondary and unspecified malignant neoplasm of lymph node, unspecified: Secondary | ICD-10-CM

## 2013-12-27 DIAGNOSIS — Z780 Asymptomatic menopausal state: Secondary | ICD-10-CM

## 2014-01-04 ENCOUNTER — Telehealth: Payer: Self-pay | Admitting: Oncology

## 2014-01-04 NOTE — Telephone Encounter (Signed)
per GM to move pt appt to HF @10 :15-cld & left pt message about appt time chge & date

## 2014-01-10 ENCOUNTER — Other Ambulatory Visit: Payer: Medicare Other

## 2014-01-17 ENCOUNTER — Ambulatory Visit: Payer: Medicare Other | Admitting: Oncology

## 2014-01-17 ENCOUNTER — Other Ambulatory Visit: Payer: Self-pay | Admitting: Nurse Practitioner

## 2014-01-17 ENCOUNTER — Ambulatory Visit (HOSPITAL_BASED_OUTPATIENT_CLINIC_OR_DEPARTMENT_OTHER): Payer: Medicare Other | Admitting: Nurse Practitioner

## 2014-01-17 ENCOUNTER — Other Ambulatory Visit (HOSPITAL_BASED_OUTPATIENT_CLINIC_OR_DEPARTMENT_OTHER): Payer: Medicare Other

## 2014-01-17 ENCOUNTER — Encounter: Payer: Self-pay | Admitting: Nurse Practitioner

## 2014-01-17 ENCOUNTER — Telehealth: Payer: Self-pay | Admitting: Nurse Practitioner

## 2014-01-17 VITALS — BP 150/61 | HR 78 | Temp 98.6°F | Resp 20 | Ht 67.0 in | Wt 230.0 lb

## 2014-01-17 DIAGNOSIS — C7989 Secondary malignant neoplasm of other specified sites: Secondary | ICD-10-CM

## 2014-01-17 DIAGNOSIS — M8580 Other specified disorders of bone density and structure, unspecified site: Secondary | ICD-10-CM

## 2014-01-17 DIAGNOSIS — C773 Secondary and unspecified malignant neoplasm of axilla and upper limb lymph nodes: Secondary | ICD-10-CM

## 2014-01-17 DIAGNOSIS — C50912 Malignant neoplasm of unspecified site of left female breast: Secondary | ICD-10-CM

## 2014-01-17 DIAGNOSIS — C779 Secondary and unspecified malignant neoplasm of lymph node, unspecified: Secondary | ICD-10-CM

## 2014-01-17 DIAGNOSIS — N6320 Unspecified lump in the left breast, unspecified quadrant: Secondary | ICD-10-CM

## 2014-01-17 DIAGNOSIS — Z170 Estrogen receptor positive status [ER+]: Secondary | ICD-10-CM

## 2014-01-17 DIAGNOSIS — I890 Lymphedema, not elsewhere classified: Secondary | ICD-10-CM

## 2014-01-17 LAB — CBC WITH DIFFERENTIAL/PLATELET
BASO%: 0.8 % (ref 0.0–2.0)
Basophils Absolute: 0 10*3/uL (ref 0.0–0.1)
EOS%: 3.7 % (ref 0.0–7.0)
Eosinophils Absolute: 0.2 10*3/uL (ref 0.0–0.5)
HCT: 38.9 % (ref 34.8–46.6)
HGB: 12.2 g/dL (ref 11.6–15.9)
LYMPH%: 27.7 % (ref 14.0–49.7)
MCH: 24.5 pg — ABNORMAL LOW (ref 25.1–34.0)
MCHC: 31.4 g/dL — ABNORMAL LOW (ref 31.5–36.0)
MCV: 78 fL — ABNORMAL LOW (ref 79.5–101.0)
MONO#: 0.4 10*3/uL (ref 0.1–0.9)
MONO%: 6.3 % (ref 0.0–14.0)
NEUT#: 3.7 10*3/uL (ref 1.5–6.5)
NEUT%: 61.5 % (ref 38.4–76.8)
Platelets: 234 10*3/uL (ref 145–400)
RBC: 4.98 10*6/uL (ref 3.70–5.45)
RDW: 15.6 % — AB (ref 11.2–14.5)
WBC: 6 10*3/uL (ref 3.9–10.3)
lymph#: 1.7 10*3/uL (ref 0.9–3.3)

## 2014-01-17 LAB — COMPREHENSIVE METABOLIC PANEL (CC13)
ALT: 8 U/L (ref 0–55)
AST: 9 U/L (ref 5–34)
Albumin: 3.5 g/dL (ref 3.5–5.0)
Alkaline Phosphatase: 70 U/L (ref 40–150)
Anion Gap: 9 mEq/L (ref 3–11)
BILIRUBIN TOTAL: 0.37 mg/dL (ref 0.20–1.20)
BUN: 13.7 mg/dL (ref 7.0–26.0)
CALCIUM: 9.7 mg/dL (ref 8.4–10.4)
CHLORIDE: 107 meq/L (ref 98–109)
CO2: 25 meq/L (ref 22–29)
Creatinine: 0.8 mg/dL (ref 0.6–1.1)
Glucose: 162 mg/dl — ABNORMAL HIGH (ref 70–140)
Potassium: 3.7 mEq/L (ref 3.5–5.1)
SODIUM: 141 meq/L (ref 136–145)
TOTAL PROTEIN: 7.1 g/dL (ref 6.4–8.3)

## 2014-01-17 NOTE — Progress Notes (Signed)
ID: [name] OB: November 17, 1932  MR#: [id number]  XHB#:[id number]  PCP: Lynne Logan, MD GYN:   SUCoralie Keens, MD OTHER MD: Kyung Rudd, MD  CHIEF COMPLAINT:  1)  Hx Right Breast Cancer    2)  Hx Left Breast Cancer, with recurrence and metastatic adenopathy (anastrazole)  BREAST CANCER HISTORY: Cedric's history of breast cancer dates back to 1993, when she underwent biopsy of her right breast in [place]. I do not have those details, but she moved to [place] shortly thereafter and it was in [place] where she had her right lumpectomy and axillary lymph node dissection. She had a total of 6 lymph nodes removed, she tells me, and all of them were clear. She does not recall the size of the primary. She was treated with adjuvant radiation and then received tamoxifen between 1993 and 1998.  In 2002 she had a local recurrence in the right breast, and underwent right mastectomy. She received tamoxifen between 2002 and 2007.  In 2004 she underwent a left mastectomy and sentinel lymph node sampling for a 9 mm mucinous carcinoma, grade 2, multifocal, associated with ductal carcinoma in situ. The single sentinel lymph node was negative. Margins were ample. She did not receive adjuvant radiation, but continued on tamoxifen as noted above, until 2007.  More recently, in July of 2014, the patient noted a "pimple" in the left chest wall, superior to her prior mastectomy incision. She brought this to the attention of her surgeon, Dr. Ninfa Linden and he describes a dimple area with a firm mass underneath. He also describes a large lipoma in the left clavicular area and a second one on her abdominal wall. Both of these were symptomatic. All 3 masses were removed 01/26/2013. 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 The lesion abutted the closest inked resection margin. The other 2 masses proved to be lipomas.  The patient's  subsequent history is as detailed below  INTERVAL HISTORY: Rosie returns to clinic today for follow up of her recurrent left breast cancer with metastatic adenopathy. She has been on anastrozole for 6 months now and is tolerating it well. She denies hot flashes or vaginal changes. She does have chronic joint pain from her history of arthritis in her shoulders, knees and hips, but this has not worsened on anastrozole. Azyiah is still bothered by a "lump" on her left chest wall that she had examined this summer. She was offered an ultrasound of this area at that time, but declined.   REVIEW OF SYSTEMS: Soniya denies fevers, chills, nausea, vomiting, or changes in bowel or bladder habits. She has some shortness of breath with exertion, but no cough, chest pains, palpitations, or fatigue. She has no headaches, dizziness, or change in vision. She does "chair aerobics" a few times a week for exercise. She has worsening lymphedema in her left upper extremity, but no longer wears her sleeve because it doesn't fit.  A detailed review of systems is otherwise noncontributory.    PAST MEDICAL HISTORY: Past Medical History  Diagnosis Date  . Hypertension   . Cancer     breast  . Arthritis   . Breast cancer     b/l mastectomies hx  . Diabetes mellitus     fasting 90-100  . Hypercholesterolemia   . Hx of radiation therapy     breasts hx  . Type II or unspecified type diabetes mellitus without mention of complication,  not stated as uncontrolled 03/25/2013  . HTN (hypertension) 03/25/2013  . 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 Abdominal hysterectomy    . Hernia repair  04-26-2010  . Breast surgery Bilateral     mastectomy  . Appendectomy    . Ankle surgery Right 1995  . Mass excision Left 01/26/2013    Procedure: EXCISION LEFT CHEST WALL MASS AND LEFT ABDOMNAL WALL MASS;  Surgeon: Harl Bowie, MD;   Location: [facility];  Service: General;  Laterality: Left;    FAMILY HISTORY The patient's father died from a heart attack at the age of 23. The patient's mother died at the age of 27 from myocarditis. The patient had no brothers, one sister. There is no history of breast or ovarian cancer in the family to her knowledge  GYNECOLOGIC HISTORY:   (Reviewed 09/22/2013) Menarche age 9, first live birth age 68, the patient is [place] P5. She underwent hysterectomy in 1984. She did not use hormone replacement.  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 Her husband Radiographer, therapeutic used to work in Radio [employer] but is now retired. Their children are: Reed Breech who lives in [place] and teaches theater there; Julius Bowels who works as a Social worker in [place]; Archivist who lives in [place] and is a "radio personality"; Elta Guadeloupe, who is a Biomedical scientist in [place], and Legrand Como who is a musician living in [place]. The patient has 2 grandchildren. She attends a [employer]    ADVANCED DIRECTIVES: Not in place   HEALTH MAINTENANCE:  (Updated 09/22/2013) History  Substance Use Topics  . Smoking status: Former Smoker    Types: Cigarettes    Quit date: 04/14/1972  . Smokeless tobacco: Never Used  . Alcohol Use: Yes     Comment: occasional    Colonoscopy: 2011  PAP: Status post hysterectomy  Bone density: 1993 - to be repeated in 2015  Lipid panel: not on file  Allergies  Allergen Reactions  . Bactrim Other (See Comments)    Swelling to the mouth and face.  . Lisinopril Other (See Comments)    Swelling to the mouth and face.  Michail Sermon Other (See Comments)    Swelling to the mouth and face.    Current Outpatient Prescriptions  Medication Sig Dispense Refill  . amLODipine (NORVASC) 10 MG tablet Take 10 mg by mouth daily.      Marland Kitchen anastrozole (ARIMIDEX) 1 MG tablet Take 1 tablet (1 mg total) by mouth daily.  90 tablet  3  . Choline Fenofibrate (FENOFIBRIC ACID) 135 MG CPDR  Take 1 tablet by mouth daily.       . fish oil-omega-3 fatty acids 1000 MG capsule Take 2 g by mouth daily.      . Insulin Isophane Human (HUMULIN N [place]) Inject 20-40 Units into the skin every morning. 40 units in the am and 20 units in the pm      . losartan-hydrochlorothiazide (HYZAAR) 100-25 MG per tablet Take 1 tablet by mouth daily.       . metFORMIN (GLUCOPHAGE) 500 MG tablet Take 500 mg by mouth daily with breakfast.      . HYDROcodone-acetaminophen (NORCO/VICODIN) 5-325 MG per tablet        No current facility-administered medications for this visit.    OBJECTIVE: Older African American woman who appears comfortable and is in no acute distress Filed Vitals:  01/17/14 0950  BP: 150/61  Pulse: 78  Temp: 98.6 F (37 C)  Resp: 20     Body mass index is 36.01 kg/(m^2).    ECOG FS: 1 Filed Weights   01/17/14 0950  Weight: 230 lb (104.327 kg)   Physical Exam: Skin: warm, dry  HEENT: sclerae anicteric, conjunctivae pink, oropharynx clear. No thrush or mucositis.  Lymph Nodes: No cervical or supraclavicular lymphadenopathy  Lungs: clear to auscultation bilaterally, no rales, wheezes, or rhonci  Heart: regular rate and rhythm  Abdomen: round, soft, non tender, positive bowel sounds  Musculoskeletal: No focal spinal tenderness, no left upper extremity lymphedema extending into hand starting to "harden" in areas along her forearm Neuro: non focal, well oriented, positive affect  Breasts: status post bilateral mastectomies, no evidence of recurrent disease to right chest wall. Soft, mobile, non tender mass to left upper chest wall ~1.5cm, located under old incision. Possibly scar or fatty tissue. Bilateral axillae benign.   LAB RESULTS:  Lab Results  Component Value Date   WBC 6.0 01/17/2014   NEUTROABS 3.7 01/17/2014   HGB 12.2 01/17/2014   HCT 38.9 01/17/2014   MCV 78.0* 01/17/2014   PLT 234 01/17/2014      Chemistry      Component Value Date/Time   NA 141 01/17/2014 0929   NA  139 01/21/2013 1028   K 3.7 01/17/2014 0929   K 3.6 01/21/2013 1028   CL 101 01/21/2013 1028   CO2 25 01/17/2014 0929   CO2 24 01/21/2013 1028   BUN 13.7 01/17/2014 0929   BUN 16 01/21/2013 1028   CREATININE 0.8 01/17/2014 0929   CREATININE 0.77 01/21/2013 1028   CREATININE 0.74 06/14/2012 1257      Component Value Date/Time   CALCIUM 9.7 01/17/2014 0929   CALCIUM 9.2 01/21/2013 1028   ALKPHOS 70 01/17/2014 0929   ALKPHOS 86 04/20/2010 0942   AST 9 01/17/2014 0929   AST 21 04/20/2010 0942   ALT 8 01/17/2014 0929   ALT 18 04/20/2010 0942   BILITOT 0.37 01/17/2014 0929   BILITOT 0.5 04/20/2010 0942      STUDIES:  Most recent bone density scan on 12/27/13 showed a t-score of -1.8 (osteopenia)  ASSESSMENT: 80 y.o. [place] woman  (1) status post right lumpectomy and axillary dissection in 1993 in [place] for what appears to have been a stage I breast cancer, treated with radiation and then tamoxifen for 5 years  (2) status post right mastectomy 2002 for a recurrence in the right breast, treated adjuvantly with tamoxifen for an additional 5 years  (3) status post left mastectomy and sentinel lymph node sampling 03/07/2003 for a pT1b, pN0, stage IA mucinous breast cancer, grade 2; no radiation therapy was given. Tamoxifen was continued until 2009  (4) excisional biopsy of a left chest wall mass 01/26/2013 showing carcinoma consistent with invasive ductal carcinoma, estrogen receptor 100% positive, progesterone receptor 0% positive, with an MIB-1 of 22% and no HER-2 amplification.   (5)  PET scan on 03/03/2013 confirmed extensive metastatic adenopathy in the left axilla, left subpectoral musculature, and left supraclavicular region  (6)  started on anastrozole daily beginning 03/01/2013, interrupted during radiation therapy, resumed March 2015  (7) radiation therapy completed 05/23/2013  (8) Osteopenia, zometa yearly pending dental visit Fall 2015  PLAN: Persephanie is doing well today. The labs  were reviewed in detail and were stable. She is tolerating the anastrozole well and will continue therapy on this drug. Her bone density scan  did show osteopenia, and we discussed yearly zoledronic acid infusions as a potential intervention. She has a dentist appointment coming up in the next few weeks and she will ask about any pending dental work. She will let Dr. Jana Hakim know on her next visit whether or not she is interested. In the meantime I advised her to begin a calcium and vitamin D supplement daily.   As for the palpable abnormality in her left chest wall, Cleo was offered an ultrasound of this area during a previous follow up visit and is interested in such a study today. I am making arrangements for this test to be performed in the next week. I am also sending her back to the lymphedema clinic for more therapy as her left arm and hand swelling is getting worse.   Briunna will return to clinic in 6 months for labs and an office visit. She understands and agrees with this plan. She has been encouraged to call with any issues that might arise before her next visit here.    Marcelino Duster, NP   01/17/2014 11:11 AM

## 2014-01-17 NOTE — Telephone Encounter (Signed)
per pof to sch pt appt-sch [place] @ [facility]-gave pt copy of sch

## 2014-01-19 ENCOUNTER — Telehealth: Payer: Self-pay | Admitting: Oncology

## 2014-01-19 NOTE — Telephone Encounter (Signed)
appt for lb/fu per 1st  10/8 pof already on schedule. appt for lymphedema clinic already on schedule per 2nd 10/8 pof. added zometa for after 07/27/14 f/u per 3rd 10/8 pof. start time remains the same.

## 2014-01-23 ENCOUNTER — Ambulatory Visit: Payer: Medicare Other | Admitting: Physical Therapy

## 2014-01-24 ENCOUNTER — Ambulatory Visit
Admission: RE | Admit: 2014-01-24 | Discharge: 2014-01-24 | Disposition: A | Discharge status: Home / Self Care | Payer: Medicare Other | Source: Ambulatory Visit | Attending: Nurse Practitioner | Admitting: Nurse Practitioner

## 2014-01-24 DIAGNOSIS — C50912 Malignant neoplasm of unspecified site of left female breast: Secondary | ICD-10-CM

## 2014-01-24 DIAGNOSIS — N6320 Unspecified lump in the left breast, unspecified quadrant: Secondary | ICD-10-CM

## 2014-01-25 ENCOUNTER — Ambulatory Visit: Payer: Medicare Other | Attending: Oncology | Admitting: Physical Therapy

## 2014-01-25 DIAGNOSIS — Z79899 Other long term (current) drug therapy: Secondary | ICD-10-CM | POA: Diagnosis not present

## 2014-01-25 DIAGNOSIS — Z923 Personal history of irradiation: Secondary | ICD-10-CM | POA: Insufficient documentation

## 2014-01-25 DIAGNOSIS — I890 Lymphedema, not elsewhere classified: Secondary | ICD-10-CM | POA: Insufficient documentation

## 2014-01-25 DIAGNOSIS — Z5189 Encounter for other specified aftercare: Secondary | ICD-10-CM | POA: Diagnosis not present

## 2014-01-25 DIAGNOSIS — I10 Essential (primary) hypertension: Secondary | ICD-10-CM | POA: Insufficient documentation

## 2014-01-25 DIAGNOSIS — Z794 Long term (current) use of insulin: Secondary | ICD-10-CM | POA: Diagnosis not present

## 2014-01-25 DIAGNOSIS — M1389 Other specified arthritis, multiple sites: Secondary | ICD-10-CM | POA: Insufficient documentation

## 2014-01-25 DIAGNOSIS — E785 Hyperlipidemia, unspecified: Secondary | ICD-10-CM | POA: Diagnosis not present

## 2014-01-25 DIAGNOSIS — Z9011 Acquired absence of right breast and nipple: Secondary | ICD-10-CM | POA: Insufficient documentation

## 2014-01-25 DIAGNOSIS — C50912 Malignant neoplasm of unspecified site of left female breast: Secondary | ICD-10-CM | POA: Diagnosis not present

## 2014-01-25 DIAGNOSIS — E119 Type 2 diabetes mellitus without complications: Secondary | ICD-10-CM | POA: Diagnosis not present

## 2014-02-08 ENCOUNTER — Ambulatory Visit: Payer: Medicare Other

## 2014-02-08 DIAGNOSIS — Z5189 Encounter for other specified aftercare: Secondary | ICD-10-CM | POA: Diagnosis not present

## 2014-02-13 ENCOUNTER — Ambulatory Visit: Payer: Medicare Other | Attending: Oncology | Admitting: Physical Therapy

## 2014-02-13 DIAGNOSIS — C50912 Malignant neoplasm of unspecified site of left female breast: Secondary | ICD-10-CM | POA: Insufficient documentation

## 2014-02-13 DIAGNOSIS — Z923 Personal history of irradiation: Secondary | ICD-10-CM | POA: Diagnosis not present

## 2014-02-13 DIAGNOSIS — Z794 Long term (current) use of insulin: Secondary | ICD-10-CM | POA: Insufficient documentation

## 2014-02-13 DIAGNOSIS — I890 Lymphedema, not elsewhere classified: Secondary | ICD-10-CM

## 2014-02-13 DIAGNOSIS — I10 Essential (primary) hypertension: Secondary | ICD-10-CM | POA: Insufficient documentation

## 2014-02-13 DIAGNOSIS — E119 Type 2 diabetes mellitus without complications: Secondary | ICD-10-CM | POA: Insufficient documentation

## 2014-02-13 DIAGNOSIS — Z5189 Encounter for other specified aftercare: Secondary | ICD-10-CM | POA: Insufficient documentation

## 2014-02-13 DIAGNOSIS — Z9011 Acquired absence of right breast and nipple: Secondary | ICD-10-CM | POA: Diagnosis not present

## 2014-02-13 DIAGNOSIS — Z79899 Other long term (current) drug therapy: Secondary | ICD-10-CM | POA: Diagnosis not present

## 2014-02-13 DIAGNOSIS — M1389 Other specified arthritis, multiple sites: Secondary | ICD-10-CM | POA: Insufficient documentation

## 2014-02-13 DIAGNOSIS — E785 Hyperlipidemia, unspecified: Secondary | ICD-10-CM | POA: Insufficient documentation

## 2014-02-14 NOTE — Therapy (Signed)
Physical Therapy Treatment  Patient Details  Name: Tami Schmidt MRN: [id number] Date of Birth: 1932-05-02  Encounter Date: 02/13/2014      PT End of Session - 02/14/14 1339    Visit Number 3   Number of Visits 18   Date for PT Re-Evaluation 03/27/14   PT Start Time 3790   PT Stop Time 1525   PT Time Calculation (min) 48 min      Past Medical History  Diagnosis Date  . Hypertension   . Cancer     breast  . Arthritis   . Breast cancer     b/l mastectomies hx  . Diabetes mellitus     fasting 90-100  . Hypercholesterolemia   . Hx of radiation therapy     breasts hx  . Type II or unspecified type diabetes mellitus without mention of complication, not stated as uncontrolled 03/25/2013  . HTN (hypertension) 03/25/2013  . 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 Abdominal hysterectomy    . Hernia repair  04-26-2010  . Breast surgery Bilateral     mastectomy  . Appendectomy    . Ankle surgery Right 1995  . Mass excision Left 01/26/2013    Procedure: EXCISION LEFT CHEST WALL MASS AND LEFT ABDOMNAL WALL MASS;  Surgeon: Harl Bowie, MD;  Location: [facility];  Service: General;  Laterality: Left;    There were no vitals taken for this visit.  Visit Diagnosis:  Lymphedema of arm      Subjective Assessment - 02/14/14 1313    Symptoms The bandages drooped down.   Currently in Pain? No/denies          [facility] PT Assessment - 02/14/14 1300    Precautions Other (comment)  cancer precautions                  Plan - 02/14/14 1340    Clinical Impression Statement Pt. noted benefit from first bandaging experience, but bandages loosened before her appt. today.   Pt will benefit from skilled therapeutic intervention in order to improve on the following deficits Increased edema   Rehab Potential Excellent   PT Frequency Min 3X/week   PT Duration 6 weeks   PT Treatment/Interventions Manual  techniques   PT Plan Continue completed decongestive therapy.        Problem List Patient Active Problem List   Diagnosis Date Noted  . Lymphedema of upper extremity 01/17/2014  . Osteopenia 01/17/2014  . Breast cancer, left breast 06/16/2013  . Type II or unspecified type diabetes mellitus without mention of complication, not stated as uncontrolled 03/25/2013  . HTN (hypertension) 03/25/2013  . 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 Chest wall recurrence of breast cancer 02/21/2013  . Abdominal wall mass 01/14/2013  . Abdominal distension 06/14/2012    Treatment today:  Manual lymph drainage in supine as follows: short neck, left inguinal nodes, superficial and deep abdominals; left axillo-inguinal anastamoses. Left upper extremity from fingers and dorsal hand to lateral shoulder redirecting along pathways.  Lotion applied.  Bandaging with stockinette, peach Medi small dot foam on dorsal hand and on posterior forearm; Elastomull to five fingers, Artiflex x 2, and 4 short stretch bandages from hand to shoulder.  Pt said the bandages were comfortable and showed ability to move all joints with them on; she verbalized understanding to remove them in event of pain or paresthesias unrelieved by exercise.  Short Term Clinic Goals - 02/14/14 1344    Title reduce edema by 0.5 cm. at hand at level of thumb webspace   Time 3   Period Weeks   Status On-going          Long Term Clinic Goals - 02/14/14 1344    Title verbalize good understanding of the maintenance phase of lymphedema treatment   Time 6   Period Weeks   Status On-going   Title reduce edema by 1 cm. at hand at level of thumb webspace   Time 6   Period Weeks   Status On-going   Title be independent with a home exercise program   Time 6   Period Weeks   Status On-going          Crosley Stejskal 02/14/2014, 1:47 PM

## 2014-02-15 ENCOUNTER — Ambulatory Visit: Payer: Medicare Other

## 2014-02-15 DIAGNOSIS — Z5189 Encounter for other specified aftercare: Secondary | ICD-10-CM | POA: Diagnosis not present

## 2014-02-15 DIAGNOSIS — I890 Lymphedema, not elsewhere classified: Secondary | ICD-10-CM

## 2014-02-15 NOTE — Therapy (Signed)
Physical Therapy Treatment  Patient Details  Name: Tami Schmidt MRN: [id number] Date of Birth: Jan 03, 1933  Encounter Date: 02/15/2014      PT End of Session - 02/15/14 1531    Visit Number 4   Number of Visits 12   PT Start Time 5573   PT Stop Time 2202   PT Time Calculation (min) 46 min      Past Medical History  Diagnosis Date  . Hypertension   . Cancer     breast  . Arthritis   . Breast cancer     b/l mastectomies hx  . Diabetes mellitus     fasting 90-100  . Hypercholesterolemia   . Hx of radiation therapy     breasts hx  . Type II or unspecified type diabetes mellitus without mention of complication, not stated as uncontrolled 03/25/2013  . HTN (hypertension) 03/25/2013  . 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 Abdominal hysterectomy    . Hernia repair  04-26-2010  . Breast surgery Bilateral     mastectomy  . Appendectomy    . Ankle surgery Right 1995  . Mass excision Left 01/26/2013    Procedure: EXCISION LEFT CHEST WALL MASS AND LEFT ABDOMNAL WALL MASS;  Surgeon: Harl Bowie, MD;  Location: [facility];  Service: General;  Laterality: Left;    There were no vitals taken for this visit.  Visit Diagnosis:  Lymphedema of arm      Subjective Assessment - 02/14/14 1313    Symptoms The bandages drooped down.   Currently in Pain? No/denies          [facility] PT Assessment - 02/14/14 1300    Precautions   Precautions Other (comment)  cancer precautions      Removed bandages and washed arm with soap/water, skin check good, no redness. Manual lymph drainage in supine as follows: short neck, left inguinal nodes, superficial and deep abdominals; left axillo-inguinal anastamoses. Left upper extremity from fingers and dorsal hand to lateral shoulder redirecting along pathways focusing on forearm, noticeable softening of tissue after treatment today. Compression Bandaging to Lt UE: Biotone  lotion, stockinette, Elastomull to first 4 fingers with Medi foam (small dots) to dorsal hand; Artiflex x2 with Medi foam (small dots) to posterior forearm, and 4 short stretch compression bandages from hand to almost axilla (shape of upper arm prevented bandage from going up to axilla due to it rolling down).         Plan - 02/15/14 1532    Clinical Impression Statement Patient is tolerating the bandaging very well and had great reductions this week.    PT Plan Continue complete decongestive therapy.        Problem List Patient Active Problem List   Diagnosis Date Noted  . Lymphedema of upper extremity 01/17/2014  . Osteopenia 01/17/2014  . Breast cancer, left breast 06/16/2013  . Type II or unspecified type diabetes mellitus without mention of complication, not stated as uncontrolled 03/25/2013  . HTN (hypertension) 03/25/2013  . 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 Chest wall recurrence of breast cancer 02/21/2013  . Abdominal wall mass 01/14/2013  . Abdominal distension 06/14/2012            LYMPHEDEMA/ONCOLOGY QUESTIONNAIRE - 02/15/14 1400    10 cm Proximal to Olecranon Process 38 cm   Olecranon Process 28.4 cm   10 cm Proximal to Ulnar Styloid Process 25.1 cm  Just Proximal to Ulnar Styloid Process 19 cm   Across Hand at [employer] 19.5 cm   At [place] of 2nd Digit 6.7 cm                                    Short Term Clinic Goals - 02/14/14 1344    CC Short Term Goal  #1   Title reduce edema by 0.5 cm. at hand at level of thumb webspace   Time 3   Period Weeks   Status On-going          Long Term Clinic Goals - 02/14/14 1344    CC Long Term Goal  #1   Title verbalize good understanding of the maintenance phase of lymphedema treatment   Time 6   Period Weeks   Status On-going   CC Long Term Goal  #2   Title reduce edema by 1 cm. at hand at level of thumb webspace   Time 6   Period Weeks   Status On-going    CC Long Term Goal  #3   Title be independent with a home exercise program   Time 6   Period Weeks   Status On-going         Collie Siad, PTA 02/15/2014 3:34 PM Otelia Limes 02/15/2014, 3:34 PM

## 2014-02-17 ENCOUNTER — Ambulatory Visit: Payer: Medicare Other | Admitting: Physical Therapy

## 2014-02-17 DIAGNOSIS — I890 Lymphedema, not elsewhere classified: Secondary | ICD-10-CM

## 2014-02-17 DIAGNOSIS — Z5189 Encounter for other specified aftercare: Secondary | ICD-10-CM | POA: Diagnosis not present

## 2014-02-17 NOTE — Therapy (Signed)
Physical Therapy Treatment  Patient Details  Name: Tami Schmidt MRN: [id number] Date of Birth: 13-May-1932  Encounter Date: 02/17/2014      PT End of Session - 02/17/14 1204    Visit Number 5   Number of Visits 18   Date for PT Re-Evaluation 03/27/14   PT Start Time 1110   PT Stop Time 1155   PT Time Calculation (min) 45 min   Activity Tolerance Patient tolerated treatment well      Past Medical History  Diagnosis Date  . Hypertension   . Cancer     breast  . Arthritis   . Breast cancer     b/l mastectomies hx  . Diabetes mellitus     fasting 90-100  . Hypercholesterolemia   . Hx of radiation therapy     breasts hx  . Type II or unspecified type diabetes mellitus without mention of complication, not stated as uncontrolled 03/25/2013  . HTN (hypertension) 03/25/2013  . 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 Abdominal hysterectomy    . Hernia repair  04-26-2010  . Breast surgery Bilateral     mastectomy  . Appendectomy    . Ankle surgery Right 1995  . Mass excision Left 01/26/2013    Procedure: EXCISION LEFT CHEST WALL MASS AND LEFT ABDOMNAL WALL MASS;  Surgeon: Harl Bowie, MD;  Location: [facility];  Service: General;  Laterality: Left;    There were no vitals taken for this visit.  Visit Diagnosis:  Lymphedema of arm   Removed bandages and washed arm with soap/water, skin check good, no redness. Manual lymph drainage in supine as follows: short neck, left inguinal nodes, superficial and deep abdominals; left axillo-inguinal anastamoses. Left upper extremity from fingers and dorsal hand to lateral shoulder redirecting along pathways focusing on forearm. Compression Bandaging to Lt UE: Biotone lotion, stockinette, Elastomull to first 4 fingers with Medi foam (small dots) to dorsal hand; Artiflex x2 with Medi foam (small dots) to posterior forearm, and 4 short stretch compression bandages from hand  to axilla. Extra tape applied to thumb to protect bandage        Plan - 02/17/14 1205    Clinical Impression Statement Continuing with good reducitons.  Faxed insurance informationt to [employer] at [facility] to initiate getting a flat knit compression sleeve.  She may be OK with off the shelf gauntlet or glove   Pt will benefit from skilled therapeutic intervention in order to improve on the following deficits Increased edema   Rehab Potential Excellent   PT Frequency Min 3X/week   PT Duration 6 weeks   PT Treatment/Interventions Manual techniques   PT Plan Continue complete decongestive therapy.        Problem List Patient Active Problem List   Diagnosis Date Noted  . Lymphedema of upper extremity 01/17/2014  . Osteopenia 01/17/2014  . Breast cancer, left breast 06/16/2013  . Type II or unspecified type diabetes mellitus without mention of complication, not stated as uncontrolled 03/25/2013  . HTN (hypertension) 03/25/2013  . 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 Chest wall recurrence of breast cancer 02/21/2013  . Abdominal wall mass 01/14/2013  . Abdominal distension 06/14/2012    Donato Heinz. Owens Shark, PT  02/17/2014, 12:10 PM

## 2014-02-20 ENCOUNTER — Ambulatory Visit: Payer: Medicare Other

## 2014-02-20 DIAGNOSIS — I890 Lymphedema, not elsewhere classified: Secondary | ICD-10-CM

## 2014-02-20 DIAGNOSIS — Z5189 Encounter for other specified aftercare: Secondary | ICD-10-CM | POA: Diagnosis not present

## 2014-02-20 NOTE — Therapy (Signed)
Physical Therapy Treatment  Patient Details  Name: Tami Schmidt MRN: [id number] Date of Birth: [birth year]  Encounter Date: 02/20/2014      PT End of Session - 02/20/14 1024    Visit Number 6   Number of Visits 18   PT Start Time 0935   PT Stop Time 1021   PT Time Calculation (min) 46 min      Past Medical History  Diagnosis Date  . Hypertension   . Cancer     breast  . Arthritis   . Breast cancer     b/l mastectomies hx  . Diabetes mellitus     fasting 90-100  . Hypercholesterolemia   . Hx of radiation therapy     breasts hx  . Type II or unspecified type diabetes mellitus without mention of complication, not stated as uncontrolled 03/25/2013  . HTN (hypertension) 03/25/2013  . 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 Abdominal hysterectomy    . Hernia repair  04-26-2010  . Breast surgery Bilateral     mastectomy  . Appendectomy    . Ankle surgery Right 1995  . Mass excision Left 01/26/2013    Procedure: EXCISION LEFT CHEST WALL MASS AND LEFT ABDOMNAL WALL MASS;  Surgeon: Harl Bowie, MD;  Location: [facility];  Service: General;  Laterality: Left;    There were no vitals taken for this visit.  Visit Diagnosis:  Lymphedema of arm  Removed bandages and washed arm with soap/water. Skin check good. Manual therapy: Manual lymph drainage in supine as follows: short neck, left inguinal nodes, superficial and deep abdominals; left axillo-inguinal anastamoses. Left upper extremity from fingers and dorsal hand to lateral shoulder redirecting along pathways. Compression Bandaging to LT UE: Biotone lotion, stockinette, Elastomull to first 4 fingers with Peach Medi foam (small dots) to dorsal hand, Atriflex x1 with same type of foam to posterior forearm, then 4 short stretch compression bandages from hand to shoulder.               Plan - 02/20/14 1025    Clinical Impression Statement Patient  continues to tolerate bandaging well with no skin irritations developing.    Pt will benefit from skilled therapeutic intervention in order to improve on the following deficits Increased edema   Rehab Potential Good   PT Plan Remeausure circumference next. Continue with complete decongestive therapy.         Problem List Patient Active Problem List   Diagnosis Date Noted  . Lymphedema of upper extremity 01/17/2014  . Osteopenia 01/17/2014  . Breast cancer, left breast 06/16/2013  . Type II or unspecified type diabetes mellitus without mention of complication, not stated as uncontrolled 03/25/2013  . HTN (hypertension) 03/25/2013  . 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 Chest wall recurrence of breast cancer 02/21/2013  . Abdominal wall mass 01/14/2013  . Abdominal distension 06/14/2012                                           Collie Siad, PTA 02/20/2014 10:28 AM Otelia Limes 02/20/2014, 10:28 AM

## 2014-02-22 ENCOUNTER — Ambulatory Visit: Payer: Medicare Other

## 2014-02-22 DIAGNOSIS — Z5189 Encounter for other specified aftercare: Secondary | ICD-10-CM | POA: Diagnosis not present

## 2014-02-22 DIAGNOSIS — I890 Lymphedema, not elsewhere classified: Secondary | ICD-10-CM

## 2014-02-22 NOTE — Therapy (Signed)
Physical Therapy Treatment  Patient Details  Name: Tami Schmidt MRN: [id number] Date of Birth: March 16, 1933  Encounter Date: 02/22/2014      PT End of Session - 02/22/14 1412    Visit Number 7   Number of Visits 18   PT Start Time 1304   PT Stop Time 1404   PT Time Calculation (min) 60 min      Past Medical History  Diagnosis Date  . Hypertension   . Cancer     breast  . Arthritis   . Breast cancer     b/l mastectomies hx  . Diabetes mellitus     fasting 90-100  . Hypercholesterolemia   . Hx of radiation therapy     breasts hx  . Type II or unspecified type diabetes mellitus without mention of complication, not stated as uncontrolled 03/25/2013  . HTN (hypertension) 03/25/2013  . 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 Abdominal hysterectomy    . Hernia repair  04-26-2010  . Breast surgery Bilateral     mastectomy  . Appendectomy    . Ankle surgery Right 1995  . Mass excision Left 01/26/2013    Procedure: EXCISION LEFT CHEST WALL MASS AND LEFT ABDOMNAL WALL MASS;  Surgeon: Harl Bowie, MD;  Location: [facility];  Service: General;  Laterality: Left;    There were no vitals taken for this visit.  Visit Diagnosis:  Lymphedema of arm      Subjective Assessment - 02/22/14 1309    Symptoms Bandage was a little uncomfortable for first time since we've been wrapping, but no pain. Have been doing exercises with my bandage on to help move the fluid out of my arm!            [facility] Adult PT Treatment/Exercise - 02/22/14 0001    Manual Therapy   Manual Lymphatic Drainage (MLD) Supine: Short neck, Lt inguinal nodes, superficial and deep abdominals, Lt axillo-inguinal anastomosis, Lt UE from dorsal hand to lateral shoulder.   Compression Bandaging Biotone lotion, stockinette, Elastomull to first 4 fingers with 1/4" gray foam with slits cut for knuckle spaces and Peach Medi foam (small dots) to dorsal  hand, Artiflex with Peach Medi foam to posterior forearm (small dots), and 4 short strech compression bandages from hand to axilla.                Plan - 02/22/14 1413    Clinical Impression Statement Patient had a good reduction of fluid today with circumferential measurements and continues to be compliant with bandaging for most optimal results. Emailed Rexford Maus regarding when she can come to measure patient for compression sleeve.   Pt will benefit from skilled therapeutic intervention in order to improve on the following deficits Increased edema   Rehab Potential Good   PT Next Visit Plan Continue with complete decongestive therapy and assess foam at knuckles.        Problem List Patient Active Problem List   Diagnosis Date Noted  . Lymphedema of upper extremity 01/17/2014  . Osteopenia 01/17/2014  . Breast cancer, left breast 06/16/2013  . Type II or unspecified type diabetes mellitus without mention of complication, not stated as uncontrolled 03/25/2013  . HTN (hypertension) 03/25/2013  . 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 Chest wall recurrence of breast cancer 02/21/2013  . Abdominal wall mass 01/14/2013  . Abdominal distension 06/14/2012  LYMPHEDEMA/ONCOLOGY QUESTIONNAIRE - 02/22/14 1315    Left Upper Extremity Lymphedema   At [facility] of 2nd Digit 6.5 cm   Across Hand at [employer] 19 cm   Just Proximal to Ulnar Styloid Process 19.1 cm   10 cm Proximal to Ulnar Styloid Process 24.6 cm   Olecranon Process 27.7 cm   10 cm Proximal to Olecranon Process 36.6 cm                                      Short Term Clinic Goals - 02/22/14 1418    CC Short Term Goal  #1   Title reduce edema by 0.5 cm. at hand at level of thumb webspace   Time 3   Period Weeks   Status Achieved          Long Term Clinic Goals - 02/22/14 1419    CC Long Term Goal  #1   Title verbalize good understanding of the  maintenance phase of lymphedema treatment including manual lymph drainage, use of compression, and lymphedema risk reduction practices.   Time 6   Period Weeks   Status On-going   CC Long Term Goal  #2   Title reduce edema by 1 cm. at hand at level of thumb webspace   Time 6   Period Weeks   Status Achieved   CC Long Term Goal  #3   Title be independent with a home exercise program   Time 6   Period Weeks   Status Achieved         Collie Siad, PTA 02/22/2014 2:22 PM Otelia Limes,  02/22/2014, 2:21 PM

## 2014-02-24 ENCOUNTER — Ambulatory Visit: Payer: Medicare Other | Admitting: Physical Therapy

## 2014-02-24 DIAGNOSIS — Z5189 Encounter for other specified aftercare: Secondary | ICD-10-CM | POA: Diagnosis not present

## 2014-02-24 DIAGNOSIS — I890 Lymphedema, not elsewhere classified: Secondary | ICD-10-CM

## 2014-02-24 NOTE — Therapy (Signed)
Physical Therapy Treatment  Patient Details  Name: Tami Schmidt MRN: [id number] Date of Birth: Aug 15, 1932  Encounter Date: 02/24/2014      PT End of Session - 02/24/14 1115    Visit Number 8   Number of Visits 18   Date for PT Re-Evaluation 03/27/14   PT Start Time 1105   PT Stop Time 1155   PT Time Calculation (min) 50 min      Past Medical History  Diagnosis Date  . Hypertension   . Cancer     breast  . Arthritis   . Breast cancer     b/l mastectomies hx  . Diabetes mellitus     fasting 90-100  . Hypercholesterolemia   . Hx of radiation therapy     breasts hx  . Type II or unspecified type diabetes mellitus without mention of complication, not stated as uncontrolled 03/25/2013  . HTN (hypertension) 03/25/2013  . 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 Abdominal hysterectomy    . Hernia repair  04-26-2010  . Breast surgery Bilateral     mastectomy  . Appendectomy    . Ankle surgery Right 1995  . Mass excision Left 01/26/2013    Procedure: EXCISION LEFT CHEST WALL MASS AND LEFT ABDOMNAL WALL MASS;  Surgeon: Harl Bowie, MD;  Location: [facility];  Service: General;  Laterality: Left;    There were no vitals taken for this visit.  Visit Diagnosis:  Lymphedema of arm      Subjective Assessment - 02/24/14 1223    Symptoms Its OK today, I think its coming down .Marland Kitchen..Marland Kitchenvery slowly.  If it doesnt get any bigger than this,I can deal with it            [facility] Adult PT Treatment/Exercise - 02/24/14 1116    Manual Therapy   Manual Therapy Edema management   Manual Lymphatic Drainage (MLD) Supine with left arm elevated on pillow: Short neck, superficial and deep abdominals, right axilla, interaxially anastamosis, left groin, left axillo-inguino anastamosis, left shoulder, upper arm, elbow, forearm, hand and fingers with return along pathways.   Compression Bandaging biotone applied, thick  stockinette, elastmull to fingers, 1-4 , peach foam to back of hand and forearm, with 1 artiflex, 4 short stretch bandages. Herringbone on wrap #3 at forearm.           Plan - 02/24/14 1213    Clinical Impression Statement Prior to treatment, firm edema at forearm and hand noted . Foam did not appear to help at hand.  Forearm and hand tissue softened during treatment today.  Pt performed AROM during treament as well to facilitate fluid movement.    PT Next Visit Plan Continue with complete decongestive therapy  encourage active exercise.  Recheck status of garments        Problem List Patient Active Problem List   Diagnosis Date Noted  . Lymphedema of upper extremity 01/17/2014  . Osteopenia 01/17/2014  . Breast cancer, left breast 06/16/2013  . Type II or unspecified type diabetes mellitus without mention of complication, not stated as uncontrolled 03/25/2013  . HTN (hypertension) 03/25/2013  . 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 Chest wall recurrence of breast cancer 02/21/2013  . Abdominal wall mass 01/14/2013  . Abdominal distension 06/14/2012      Donato Heinz. Owens Shark, PT 02/24/2014, 12:23 PM

## 2014-02-27 ENCOUNTER — Ambulatory Visit: Payer: Medicare Other

## 2014-02-27 DIAGNOSIS — Z5189 Encounter for other specified aftercare: Secondary | ICD-10-CM | POA: Diagnosis not present

## 2014-02-27 DIAGNOSIS — I890 Lymphedema, not elsewhere classified: Secondary | ICD-10-CM

## 2014-02-27 NOTE — Therapy (Signed)
Physical Therapy Treatment  Patient Details  Name: Tami Schmidt MRN: [id number] Date of Birth: [birth year]  Encounter Date: 02/27/2014      PT End of Session - 02/27/14 1154    Visit Number 9   Number of Visits 18   PT Start Time 1101   PT Stop Time 1150   PT Time Calculation (min) 49 min      Past Medical History  Diagnosis Date  . Hypertension   . Cancer     breast  . Arthritis   . Breast cancer     b/l mastectomies hx  . Diabetes mellitus     fasting 90-100  . Hypercholesterolemia   . Hx of radiation therapy     breasts hx  . Type II or unspecified type diabetes mellitus without mention of complication, not stated as uncontrolled 03/25/2013  . HTN (hypertension) 03/25/2013  . 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 Abdominal hysterectomy    . Hernia repair  04-26-2010  . Breast surgery Bilateral     mastectomy  . Appendectomy    . Ankle surgery Right 1995  . Mass excision Left 01/26/2013    Procedure: EXCISION LEFT CHEST WALL MASS AND LEFT ABDOMNAL WALL MASS;  Surgeon: Harl Bowie, MD;  Location: [facility];  Service: General;  Laterality: Left;    There were no vitals taken for this visit.  Visit Diagnosis:  Lymphedema of arm      Subjective Assessment - 02/27/14 1105    Symptoms No complaints, Tami Schmidt wants to measure me tonight but Im going to try for tomorrow so i wont be unwrapped as long.      Removed bandages and washed patient arm, skin check good. Supine:Manual lymph drainage in supine as follows: short neck, left inguinal nodes, superficial and deep abdominals; left axillo-inguinal anastamoses. Left upper extremity from fingers and dorsal hand to lateral shoulder redirecting along pathways. Compression Bandaging: Biotone lotion, thick stockinette, Elastomull to first 4 fingers with 1/4" gray foam at knuckle spaces and Peach Medi foam with small dots at dorsal hand, 4 short stretch  compression bandages from hand to axilla with Peach Medi foam with small dots at posterior forearm.               Plan - 02/27/14 1154    Clinical Impression Statement Patient comes in with bandages still on from Fridays treatment. Forearm is beginning to feel softer and able to get better skin stretch during treatment, patient able to note difference as well.   Pt will benefit from skilled therapeutic intervention in order to improve on the following deficits Increased edema   Rehab Potential Good   PT Next Visit Plan Continue with complete decongestive therapy.   PT Plan Remeausure circumference next. Continue with complete decongestive therapy.         Problem List Patient Active Problem List   Diagnosis Date Noted  . Lymphedema of upper extremity 01/17/2014  . Osteopenia 01/17/2014  . Breast cancer, left breast 06/16/2013  . Type II or unspecified type diabetes mellitus without mention of complication, not stated as uncontrolled 03/25/2013  . HTN (hypertension) 03/25/2013  . 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 Chest wall recurrence of breast cancer 02/21/2013  . Abdominal wall mass 01/14/2013  . Abdominal distension 06/14/2012  Tami Schmidt, PTA 02/27/2014, 11:57 AM

## 2014-03-01 ENCOUNTER — Ambulatory Visit: Payer: Medicare Other

## 2014-03-01 DIAGNOSIS — Z5189 Encounter for other specified aftercare: Secondary | ICD-10-CM | POA: Diagnosis not present

## 2014-03-01 DIAGNOSIS — I890 Lymphedema, not elsewhere classified: Secondary | ICD-10-CM

## 2014-03-01 NOTE — Therapy (Addendum)
Physical Therapy Treatment  Patient Details  Name: Tami Schmidt MRN: [id number] Date of Birth: 09/16/1932  Encounter Date: 03/01/2014    Past Medical History  Diagnosis Date  . Hypertension   . Cancer     breast  . Arthritis   . Breast cancer     b/l mastectomies hx  . Diabetes mellitus     fasting 90-100  . Hypercholesterolemia   . Hx of radiation therapy     breasts hx  . Type II or unspecified type diabetes mellitus without mention of complication, not stated as uncontrolled 03/25/2013  . HTN (hypertension) 03/25/2013  . 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 Abdominal hysterectomy    . Hernia repair  04-26-2010  . Breast surgery Bilateral     mastectomy  . Appendectomy    . Ankle surgery Right 1995  . Mass excision Left 01/26/2013    Procedure: EXCISION LEFT CHEST WALL MASS AND LEFT ABDOMNAL WALL MASS;  Surgeon: Harl Bowie, MD;  Location: [place] [place];  Service: General;  Laterality: Left;    There were no vitals taken for this visit.  Visit Diagnosis:  Lymphedema of arm     Manual lymph drainage in supine as follows: short neck, left inguinal nodes, superficial and deep abdominals; left axillo-inguinal anastamoses. Left upper extremity from fingers and dorsal hand to lateral shoulder redirecting along pathways. Compression Bandaging: lotion, thick stockinette, Elastomull to first 4 fingers with 1/4" gray foam at knuckle spaces and Peach Medi foam with small dots at dorsal hand, then 4 short stretch compression bandages, with Peach medi foam small dots at posterior forearm, from hand to axilla.                G-Codes - 2014/03/13 0840    Functional Assessment Tool Used clinical judgement   Functional Limitation Other PT primary   Other PT Primary Current Status (V6720) At least 40 percent but less than 60 percent impaired, limited or restricted   Other PT Primary Goal Status (N4709) At  least 1 percent but less than 20 percent impaired, limited or restricted      Problem List Patient Active Problem List   Diagnosis Date Noted  . Lymphedema of upper extremity 01/17/2014  . Osteopenia 01/17/2014  . Breast cancer, left breast 06/16/2013  . Type II or unspecified type diabetes mellitus without mention of complication, not stated as uncontrolled 03/25/2013  . HTN (hypertension) 03/25/2013  . 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 Chest wall recurrence of breast cancer 02/21/2013  . Abdominal wall mass 01/14/2013  . Abdominal distension 06/14/2012                                              Norwood Levo, PTA 03/13/2014, 8:50 AM

## 2014-03-03 ENCOUNTER — Ambulatory Visit: Payer: Medicare Other | Admitting: Physical Therapy

## 2014-03-03 DIAGNOSIS — Z5189 Encounter for other specified aftercare: Secondary | ICD-10-CM | POA: Diagnosis not present

## 2014-03-03 DIAGNOSIS — I890 Lymphedema, not elsewhere classified: Secondary | ICD-10-CM

## 2014-03-03 NOTE — Therapy (Signed)
Physical Therapy Treatment  Patient Details  Name: Tami Schmidt MRN: [id number] Date of Birth: Jan 24, 1933  Encounter Date: 03/03/2014      PT End of Session - 03/03/14 1212    Visit Number 11   Number of Visits 18   Date for PT Re-Evaluation 03/27/14   PT Start Time 1105   PT Stop Time 1153   PT Time Calculation (min) 48 min      Past Medical History  Diagnosis Date  . Hypertension   . Cancer     breast  . Arthritis   . Breast cancer     b/l mastectomies hx  . Diabetes mellitus     fasting 90-100  . Hypercholesterolemia   . Hx of radiation therapy     breasts hx  . Type II or unspecified type diabetes mellitus without mention of complication, not stated as uncontrolled 03/25/2013  . HTN (hypertension) 03/25/2013  . 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 Abdominal hysterectomy    . Hernia repair  04-26-2010  . Breast surgery Bilateral     mastectomy  . Appendectomy    . Ankle surgery Right 1995  . Mass excision Left 01/26/2013    Procedure: EXCISION LEFT CHEST WALL MASS AND LEFT ABDOMNAL WALL MASS;  Surgeon: Harl Bowie, MD;  Location: [place];  Service: General;  Laterality: Left;    There were no vitals taken for this visit.  Visit Diagnosis:  Lymphedema of arm      Subjective Assessment - 03/03/14 1109    Symptoms Things are going well.   Currently in Pain? No/denies            [facility] Adult PT Treatment/Exercise - 03/03/14 0001    Manual Therapy   Manual Therapy Edema management   Manual Lymphatic Drainage (MLD) Supine:  Short neck, right axilla and anterior interaxillary anastomosis, left groin and axillo-inguinal anastomosis, and left UE from fingers to shoulder.   Compression Bandaging Lotion applied, then bandaging with thick stockinette, foam at dorsal hand and fingers as well as peach Medi small dot foam; Elastomull on first four fingers; peach Medi small dot foam on  lateral forearm; four short stretch bandages from hand to shoulder.  (Issued new bandages and gave patient instructions on how to wash the bandages she has.)                Plan - 03/03/14 1213    Clinical Impression Statement Doing well and is ready to be measured for garments.  Discussed daytime and nighttime garments with her today.   PT Next Visit Plan Continue complete decongestive therapy.   PT Plan Complete decongestive therapy until garments are received.        Problem List Patient Active Problem List   Diagnosis Date Noted  . Lymphedema of upper extremity 01/17/2014  . Osteopenia 01/17/2014  . Breast cancer, left breast 06/16/2013  . Type II or unspecified type diabetes mellitus without mention of complication, not stated as uncontrolled 03/25/2013  . HTN (hypertension) 03/25/2013  . 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 Chest wall recurrence of breast cancer 02/21/2013  . Abdominal wall mass 01/14/2013  . Abdominal distension 06/14/2012         S[place],DONNA, PT 03/03/2014, 12:15 PM

## 2014-03-07 DIAGNOSIS — Z5189 Encounter for other specified aftercare: Secondary | ICD-10-CM | POA: Diagnosis not present

## 2014-03-08 ENCOUNTER — Ambulatory Visit: Payer: Medicare Other

## 2014-03-08 DIAGNOSIS — I890 Lymphedema, not elsewhere classified: Secondary | ICD-10-CM

## 2014-03-08 DIAGNOSIS — Z5189 Encounter for other specified aftercare: Secondary | ICD-10-CM | POA: Diagnosis not present

## 2014-03-08 NOTE — Therapy (Signed)
Physical Therapy Treatment  Patient Details  Name: Tami Schmidt MRN: [id number] Date of Birth: [birth year]  Encounter Date: 03/08/2014      PT End of Session - 03/08/14 1021    Visit Number 12   Number of Visits 18   Date for PT Re-Evaluation 03/27/14   PT Start Time 1019   PT Stop Time 1100   PT Time Calculation (min) 41 min      Past Medical History  Diagnosis Date  . Hypertension   . Cancer     breast  . Arthritis   . Breast cancer     b/l mastectomies hx  . Diabetes mellitus     fasting 90-100  . Hypercholesterolemia   . Hx of radiation therapy     breasts hx  . Type II or unspecified type diabetes mellitus without mention of complication, not stated as uncontrolled 03/25/2013  . HTN (hypertension) 03/25/2013  . 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 Abdominal hysterectomy    . Hernia repair  04-26-2010  . Breast surgery Bilateral     mastectomy  . Appendectomy    . Ankle surgery Right 1995  . Mass excision Left 01/26/2013    Procedure: EXCISION LEFT CHEST WALL MASS AND LEFT ABDOMNAL WALL MASS;  Surgeon: Harl Bowie, MD;  Location: [place];  Service: General;  Laterality: Left;    There were no vitals taken for this visit.  Visit Diagnosis:  Lymphedema of arm      Subjective Assessment - 03/08/14 1022    Symptoms Just took bandages off this morning. Its doing good! I get measured for my new compression sleeve on Monday.      Patient came in with bandages off from last appointment Friday, she just removed them this morning and PTA could tell where bandages had slid down on arm increasing fluid at hand. Discussed this with patient and she verbalized understanding. Circumference measurements taken.  Manual lymph drainage in supine as follows: short neck, left inguinal nodes, superficial and deep abdominals; left axillo-inguinal anastamoses. Left upper extremity from fingers and dorsal  hand to lateral shoulder redirecting along pathways. Compression Bandaging to Lt UE: Biotone lotion, thick stockinette, Elastomull to first 4 finger with 1/4" gray foam between knuckles and peach Medi foam at dorsal hand, Peach medi foam at posterior forearm and 4 short stretch bandages from hand to axilla. Patient to remove bandages before next appointment when they start to slide down arm (next appointment in 8 days due to patient traveling)               Plan - 03/08/14 1103    Clinical Impression Statement Patient to be measured Monday for garments. Discussed removeing bandages when they start to slide down arm when its a few extra days than normal between appointments. PAtient verbalized understanding this.   Pt will benefit from skilled therapeutic intervention in order to improve on the following deficits Increased edema   Rehab Potential Good   PT Next Visit Plan Continue complete decongestive therapy.   PT Plan Complete decongestive therapy until garments are received.          G-Codes - 2014/03/21 0840    Functional Assessment Tool Used clinical judgement   Functional Limitation Other PT primary   Other PT Primary Current Status (X7353) At least 40 percent but less than 60 percent impaired, limited or restricted   Other PT Primary Goal  Status [phone]) At least 1 percent but less than 20 percent impaired, limited or restricted      Problem List Patient Active Problem List   Diagnosis Date Noted  . Lymphedema of upper extremity 01/17/2014  . Osteopenia 01/17/2014  . Breast cancer, left breast 06/16/2013  . Type II or unspecified type diabetes mellitus without mention of complication, not stated as uncontrolled 03/25/2013  . HTN (hypertension) 03/25/2013  . 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 Chest wall recurrence of breast cancer 02/21/2013  . Abdominal wall mass 01/14/2013  . Abdominal distension 06/14/2012            LYMPHEDEMA/ONCOLOGY  QUESTIONNAIRE - 03/08/14 1023    Left Upper Extremity Lymphedema   At [facility] of 2nd Digit 6.8   Across Hand at [employer] 19.7   Just Proximal to Ulnar Styloid Process 19.5   10 cm Proximal to Ulnar Styloid Process 24.6   Olecranon Process 28.7   10 cm Proximal to Olecranon Process 35.3                                          Otelia Limes, PTA 03/08/2014, 11:04 AM

## 2014-03-15 ENCOUNTER — Ambulatory Visit: Payer: Medicare Other | Attending: Oncology

## 2014-03-15 DIAGNOSIS — E119 Type 2 diabetes mellitus without complications: Secondary | ICD-10-CM | POA: Diagnosis not present

## 2014-03-15 DIAGNOSIS — C50912 Malignant neoplasm of unspecified site of left female breast: Secondary | ICD-10-CM | POA: Diagnosis not present

## 2014-03-15 DIAGNOSIS — I890 Lymphedema, not elsewhere classified: Secondary | ICD-10-CM | POA: Diagnosis not present

## 2014-03-15 DIAGNOSIS — E785 Hyperlipidemia, unspecified: Secondary | ICD-10-CM | POA: Insufficient documentation

## 2014-03-15 DIAGNOSIS — M1389 Other specified arthritis, multiple sites: Secondary | ICD-10-CM | POA: Insufficient documentation

## 2014-03-15 DIAGNOSIS — I10 Essential (primary) hypertension: Secondary | ICD-10-CM | POA: Insufficient documentation

## 2014-03-15 DIAGNOSIS — Z79899 Other long term (current) drug therapy: Secondary | ICD-10-CM | POA: Diagnosis not present

## 2014-03-15 DIAGNOSIS — Z9011 Acquired absence of right breast and nipple: Secondary | ICD-10-CM | POA: Insufficient documentation

## 2014-03-15 DIAGNOSIS — Z794 Long term (current) use of insulin: Secondary | ICD-10-CM | POA: Insufficient documentation

## 2014-03-15 DIAGNOSIS — Z923 Personal history of irradiation: Secondary | ICD-10-CM | POA: Insufficient documentation

## 2014-03-15 DIAGNOSIS — Z5189 Encounter for other specified aftercare: Secondary | ICD-10-CM | POA: Insufficient documentation

## 2014-03-15 NOTE — Therapy (Addendum)
[place], [place], [address] Phone: [phone]   Fax:  [phone]  Physical Therapy Treatment  Patient Details  Name: Tami Schmidt MRN: [id number] Date of Birth: 12/05/1932  Encounter Date: 03/15/2014      PT End of Session - 03/15/14 1707    Visit Number 13   Number of Visits 18   Date for PT Re-Evaluation 03/27/14   PT Start Time 1611   PT Stop Time 1657   PT Time Calculation (min) 46 min      Past Medical History  Diagnosis Date  . Hypertension   . Cancer     breast  . Arthritis   . Breast cancer     b/l mastectomies hx  . Diabetes mellitus     fasting 90-100  . Hypercholesterolemia   . Hx of radiation therapy     breasts hx  . Type II or unspecified type diabetes mellitus without mention of complication, not stated as uncontrolled 03/25/2013  . HTN (hypertension) 03/25/2013  . 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 Abdominal hysterectomy    . Hernia repair  04-26-2010  . Breast surgery Bilateral     mastectomy  . Appendectomy    . Ankle surgery Right 1995  . Mass excision Left 01/26/2013    Procedure: EXCISION LEFT CHEST WALL MASS AND LEFT ABDOMNAL WALL MASS;  Surgeon: Harl Bowie, MD;  Location: [facility];  Service: General;  Laterality: Left;    There were no vitals taken for this visit.  Visit Diagnosis:  Lymphedema of arm      Subjective Assessment - 03/15/14 1614    Symptoms My husband tried to wrap it for me over the weekend, but my hand has gotten bigger again. Want to make Monday my last appt. since my sleeve will be here soon.   Currently in Pain? No/denies            [facility] Adult PT Treatment/Exercise - 03/15/14 0001    Manual Therapy   Manual Lymphatic Drainage (MLD) Supine: Short neck, Lt inguinal nodes, superficial and deep abdominals, Lt axillo-inguinal anastomosis, nd Lt UE from dorsal hand to  lateral shoulder focusing on forearm and dorsal hand.   Compression Bandaging Bioton lotion, thick stockinette, Elastomull to first 4 fingers with 1/4" gray foam at knuckle spaces and over thumb and peach medi foam with small dots over foam at dorsal hand, 4 short stretch compression bandages from hand to axilla.                Plan - 03/15/14 1707    Clinical Impression Statement Some increased swelling noted at dorsal hand, when pts husband rewrapped arm they didnt use Elastomull and he hasnt been taught how to bandage, so not wrapped effectively.    Pt will benefit from skilled therapeutic intervention in order to improve on the following deficits Increased edema   Rehab Potential Good   PT Next Visit Plan Continue complete decongestive therapy until garment arrives. Instruct husband if he comes how to bandage and remeasure circumference next visit.                               Problem List Patient Active Problem List   Diagnosis Date Noted  . Lymphedema of upper extremity 01/17/2014  . Osteopenia 01/17/2014  . Breast cancer, left breast 06/16/2013  .  Type II or unspecified type diabetes mellitus without mention of complication, not stated as uncontrolled 03/25/2013  . HTN (hypertension) 03/25/2013  . 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 Chest wall recurrence of breast cancer 02/21/2013  . Abdominal wall mass 01/14/2013  . Abdominal distension 06/14/2012    Tami Schmidt, PTA 03/15/2014, 5:10 PM     PHYSICAL THERAPY DISCHARGE SUMMARY  Visits from Start of Care: 13  Current functional level related to goals / functional outcomes: Goals were met.   Remaining deficits: Swelling will continue to require management.   Education / Equipment: Lymphedema self care, compression garments. Plan: Patient agrees to discharge.  Patient goals were met. Patient is being discharged due to meeting the stated rehab goals.  ?????    Pt. stopped coming, but had been waiting for compression garments, so probably received those and then stopped coming.  Serafina Royals, PT 04/27/2015 8:35 AM

## 2014-05-22 ENCOUNTER — Other Ambulatory Visit: Payer: Self-pay | Admitting: *Deleted

## 2014-05-22 DIAGNOSIS — C50912 Malignant neoplasm of unspecified site of left female breast: Secondary | ICD-10-CM

## 2014-05-22 DIAGNOSIS — C779 Secondary and unspecified malignant neoplasm of lymph node, unspecified: Secondary | ICD-10-CM

## 2014-05-22 MED ORDER — ANASTROZOLE 1 MG PO TABS: 1.0000 mg | ORAL_TABLET | Freq: Every day | ORAL | Status: DC

## 2014-05-22 NOTE — Telephone Encounter (Signed)
Call received from patient requesting refill on anastrozole with [facility] as she has changed to [facility].

## 2014-06-12 ENCOUNTER — Telehealth: Payer: Self-pay | Admitting: *Deleted

## 2014-06-12 NOTE — Telephone Encounter (Signed)
Called patient with Dr. Virgie Dad instructions to stop the anastrozole until F/U on4-14-2016 when they can discuss further options.Marland Kitchen

## 2014-06-12 NOTE — Telephone Encounter (Signed)
She has an appt w me 4/14-- OK to stay off antiestrogens until then  Thanks!

## 2014-06-12 NOTE — Telephone Encounter (Signed)
Patient called inquiring about appointment date.  "I need to be seen because I'm having joint and muscle pain.  I'm exhausted and do not have any energy.  I stopped the anastrozole around February 14th through 24th.  The symptoms stopped and now that I've resumed it I'm having joint pain again.  Call back number is [phone].

## 2014-06-12 NOTE — Telephone Encounter (Signed)
Incorrect entry.

## 2014-07-27 ENCOUNTER — Ambulatory Visit (HOSPITAL_BASED_OUTPATIENT_CLINIC_OR_DEPARTMENT_OTHER): Payer: Commercial Managed Care - HMO | Admitting: Oncology

## 2014-07-27 ENCOUNTER — Other Ambulatory Visit: Payer: Self-pay | Admitting: *Deleted

## 2014-07-27 ENCOUNTER — Other Ambulatory Visit (HOSPITAL_BASED_OUTPATIENT_CLINIC_OR_DEPARTMENT_OTHER): Payer: Commercial Managed Care - HMO

## 2014-07-27 ENCOUNTER — Ambulatory Visit (HOSPITAL_BASED_OUTPATIENT_CLINIC_OR_DEPARTMENT_OTHER): Payer: Commercial Managed Care - HMO

## 2014-07-27 ENCOUNTER — Telehealth: Payer: Self-pay | Admitting: Oncology

## 2014-07-27 VITALS — BP 174/61 | HR 85 | Temp 98.4°F | Resp 18 | Ht 67.0 in | Wt 225.2 lb

## 2014-07-27 DIAGNOSIS — M8580 Other specified disorders of bone density and structure, unspecified site: Secondary | ICD-10-CM

## 2014-07-27 DIAGNOSIS — C50912 Malignant neoplasm of unspecified site of left female breast: Secondary | ICD-10-CM

## 2014-07-27 DIAGNOSIS — C7989 Secondary malignant neoplasm of other specified sites: Secondary | ICD-10-CM

## 2014-07-27 DIAGNOSIS — C50919 Malignant neoplasm of unspecified site of unspecified female breast: Secondary | ICD-10-CM

## 2014-07-27 DIAGNOSIS — C779 Secondary and unspecified malignant neoplasm of lymph node, unspecified: Secondary | ICD-10-CM

## 2014-07-27 DIAGNOSIS — I890 Lymphedema, not elsewhere classified: Secondary | ICD-10-CM

## 2014-07-27 DIAGNOSIS — Z853 Personal history of malignant neoplasm of breast: Secondary | ICD-10-CM

## 2014-07-27 DIAGNOSIS — Z170 Estrogen receptor positive status [ER+]: Secondary | ICD-10-CM | POA: Diagnosis not present

## 2014-07-27 LAB — CBC WITH DIFFERENTIAL/PLATELET
BASO%: 0.8 % (ref 0.0–2.0)
BASOS ABS: 0.1 10*3/uL (ref 0.0–0.1)
EOS%: 3.4 % (ref 0.0–7.0)
Eosinophils Absolute: 0.2 10*3/uL (ref 0.0–0.5)
HCT: 38.6 % (ref 34.8–46.6)
HGB: 12.4 g/dL (ref 11.6–15.9)
LYMPH#: 1.9 10*3/uL (ref 0.9–3.3)
LYMPH%: 27.7 % (ref 14.0–49.7)
MCH: 24.9 pg — ABNORMAL LOW (ref 25.1–34.0)
MCHC: 32.2 g/dL (ref 31.5–36.0)
MCV: 77.5 fL — ABNORMAL LOW (ref 79.5–101.0)
MONO#: 0.5 10*3/uL (ref 0.1–0.9)
MONO%: 7.1 % (ref 0.0–14.0)
NEUT%: 61 % (ref 38.4–76.8)
NEUTROS ABS: 4.1 10*3/uL (ref 1.5–6.5)
PLATELETS: 251 10*3/uL (ref 145–400)
RBC: 4.98 10*6/uL (ref 3.70–5.45)
RDW: 16 % — AB (ref 11.2–14.5)
WBC: 6.7 10*3/uL (ref 3.9–10.3)

## 2014-07-27 LAB — COMPREHENSIVE METABOLIC PANEL (CC13)
ALBUMIN: 3.9 g/dL (ref 3.5–5.0)
ALT: 14 U/L (ref 0–55)
AST: 26 U/L (ref 5–34)
Alkaline Phosphatase: 74 U/L (ref 40–150)
Anion Gap: 10 mEq/L (ref 3–11)
BUN: 24.2 mg/dL (ref 7.0–26.0)
CO2: 26 mEq/L (ref 22–29)
Calcium: 9.5 mg/dL (ref 8.4–10.4)
Chloride: 108 mEq/L (ref 98–109)
Creatinine: 0.8 mg/dL (ref 0.6–1.1)
EGFR: 82 mL/min/{1.73_m2} — ABNORMAL LOW (ref >90)
Glucose: 98 mg/dl (ref 70–140)
POTASSIUM: 3.8 meq/L (ref 3.5–5.1)
SODIUM: 143 meq/L (ref 136–145)
TOTAL PROTEIN: 7.3 g/dL (ref 6.4–8.3)
Total Bilirubin: 0.34 mg/dL (ref 0.20–1.20)

## 2014-07-27 MED ORDER — ZOLEDRONIC ACID 4 MG/100ML IV SOLN
4.0000 mg | Freq: Once | INTRAVENOUS | Status: AC
  Administered 2014-07-27: 4 mg via INTRAVENOUS
  Filled 2014-07-27: qty 100

## 2014-07-27 MED ORDER — SODIUM CHLORIDE 0.9 % IV SOLN
Freq: Once | INTRAVENOUS | Status: AC
  Administered 2014-07-27: 15:00:00 via INTRAVENOUS

## 2014-07-27 MED ORDER — TAMOXIFEN CITRATE 20 MG PO TABS: 20.0000 mg | ORAL_TABLET | Freq: Every day | ORAL | Status: DC

## 2014-07-27 NOTE — Progress Notes (Signed)
ID: Tami Schmidt OB: [birth year]  MR#: [id number]  CSN#:[id number]  PCP: Lynne Logan, MD GYN:   SUCoralie Keens, MD OTHER MD: Kyung Rudd, MD  CHIEF COMPLAINT:  1)  Hx Right Breast Cancer    2)  Hx Left Breast Cancer, with recurrence and metastatic adenopathy  CURRENT TREATMENT: Tamoxifen  BREAST CANCER HISTORY: From the earlier summary:  Tami Schmidt's history of breast cancer dates back to 1993, when she underwent biopsy of her right breast in [place]. I do not have those details, but she moved to [place] shortly thereafter and it was in [place] where she had her right lumpectomy and axillary lymph node dissection. She had a total of 6 lymph nodes removed, she tells me, and all of them were clear. She does not recall the size of the primary. She was treated with adjuvant radiation and then received tamoxifen between 1993 and 1998.  In 2002 she had a local recurrence in the right breast, and underwent right mastectomy. She received tamoxifen between 2002 and 2007.  In 2004 she underwent a left mastectomy and sentinel lymph node sampling for a 9 mm mucinous carcinoma, grade 2, multifocal, associated with ductal carcinoma in situ. The single sentinel lymph node was negative. Margins were ample. She did not receive adjuvant radiation, but continued on tamoxifen as noted above, until 2007.  More recently, in July of 2014, the patient noted a "pimple" in the left chest wall, superior to her prior mastectomy incision. She brought this to the attention of her surgeon, Dr. Ninfa Linden and he describes a dimple area with a firm mass underneath. He also describes a large lipoma in the left clavicular area and a second one on her abdominal wall. Both of these were symptomatic. All 3 masses were removed 01/26/2013. The pathology from this procedure (SZA [id number]) showed, in the chest wall, and invasive ductal carcinoma measuring grossly 1.3 cm. The lesion abutted the closest inked resection margin. The other  2 masses proved to be lipomas.  The patient's subsequent history is as detailed below  INTERVAL HISTORY: Tami Schmidt returns to clinic today for follow up of her recurrent left breast cancer. She was on anastrozole until about a month ago when she felt so tired she just decided to stop that medication. She thinks her fatigue is a little bit better. It clearly has not resolved. It is not clear that the anastrozole was significantly contributing to that, although its early code. At this point she is unwilling to return to that so we need to discuss alternatives.   REVIEW OF SYSTEMS: Aside from the fatigue issue she is very stable. She usually gets up, cooks breakfast, does her crossword puzzle, and repeat. She spends a lot of time sitting down. She says it's hard for her to get up from a low chair. She still has the joint pains here and there, which might be slightly better. She tells me her blood sugars are well-controlled. There have not been any unusual headaches, visual changes, nausea or vomiting. A detailed review of systems today was otherwise stable  PAST MEDICAL HISTORY: Past Medical History  Diagnosis Date  . Hypertension   . Cancer     breast  . Arthritis   . Breast cancer     b/l mastectomies hx  . Diabetes mellitus     fasting 90-100  . Hypercholesterolemia   . Hx of radiation therapy     breasts hx  . Type II or unspecified type diabetes mellitus without mention  of complication, not stated as uncontrolled 03/25/2013  . HTN (hypertension) 03/25/2013  . 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 Abdominal hysterectomy    . Hernia repair  04-26-2010  . Breast surgery Bilateral     mastectomy  . Appendectomy    . Ankle surgery Right 1995  . Mass excision Left 01/26/2013    Procedure: EXCISION LEFT CHEST WALL MASS AND LEFT ABDOMNAL WALL MASS;  Surgeon: Harl Bowie, MD;  Location: [facility];   Service: General;  Laterality: Left;    FAMILY HISTORY The patient's father died from a heart attack at the age of 95. The patient's mother died at the age of 56 from myocarditis. The patient had no brothers, one sister. There is no history of breast or ovarian cancer in the family to her knowledge  GYNECOLOGIC HISTORY:   (Reviewed 09/22/2013) Menarche age 7, first live birth age 27, the patient is [place] P5. She underwent hysterectomy in 1984. She did not use hormone replacement.  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 Their children are: Reed Breech who lives in [place] and teaches theater there; Julius Bowels who works as a Social worker in [place]; Archivist who lives in [place] and is a "radio personality"; Elta Guadeloupe, who is a Biomedical scientist in [place], and Legrand Como who is a musician living in [place]. The patient has 2 grandchildren. She attends a [employer]    ADVANCED DIRECTIVES: Not in place   HEALTH MAINTENANCE:  (Updated 09/22/2013) History  Substance Use Topics  . Smoking status: Former Smoker    Types: Cigarettes    Quit date: 04/14/1972  . Smokeless tobacco: Never Used  . Alcohol Use: Yes     Comment: occasional    Colonoscopy: 2011  PAP: Status post hysterectomy  Bone density: 1993 - to be repeated in 2015  Lipid panel: not on file  Allergies  Allergen Reactions  . Bactrim Other (See Comments)    Swelling to the mouth and face.  . Lisinopril Other (See Comments)    Swelling to the mouth and face.  Michail Sermon Other (See Comments)    Swelling to the mouth and face.    Current Outpatient Prescriptions  Medication Sig Dispense Refill  . amLODipine (NORVASC) 10 MG tablet Take 10 mg by mouth daily.    Marland Kitchen anastrozole (ARIMIDEX) 1 MG tablet Take 1 tablet (1 mg total) by mouth daily. 90 tablet 0  . Choline Fenofibrate (FENOFIBRIC ACID) 135 MG CPDR Take 1 tablet by  mouth daily.     . fish oil-omega-3 fatty acids 1000 MG capsule Take 2 g by mouth daily.    Marland Kitchen HYDROcodone-acetaminophen (NORCO/VICODIN) 5-325 MG per tablet     . Insulin Isophane Human (HUMULIN N [place]) Inject 20-40 Units into the skin every morning. 40 units in the am and 20 units in the pm    . losartan-hydrochlorothiazide (HYZAAR) 100-25 MG per tablet Take 1 tablet by mouth daily.     . metFORMIN (GLUCOPHAGE) 500 MG tablet Take 500 mg by mouth daily with breakfast.     No current facility-administered medications for this visit.    OBJECTIVE: Older African American woman who appears stated age 75 Vitals:   07/27/14 1404  BP: 174/61  Pulse: 85  Temp: 98.4 F (36.9 C)  Resp: 18  Body mass index is 35.26 kg/(m^2).    ECOG FS: 2 Filed Weights   07/27/14 1404  Weight: 225 lb 3.2 oz (102.15 kg)   Sclerae unicteric, EOMs intact Oropharynx clear, some missing teeth but overall dentition in good repair No cervical or supraclavicular adenopathy Lungs no rales or rhonchi Heart regular rate and rhythm Abd soft, obese, nontender, positive bowel sounds MSK no focal spinal tenderness, chronic right upper extremity lymphedema, grade 1 Neuro: nonfocal, well oriented, positive affect Breasts: Status post bilateral mastectomies. No evidence of chest wall recurrence. Both axillae are benign.    LAB RESULTS:  Lab Results  Component Value Date   WBC 6.7 07/27/2014   NEUTROABS 4.1 07/27/2014   HGB 12.4 07/27/2014   HCT 38.6 07/27/2014   MCV 77.5* 07/27/2014   PLT 251 07/27/2014      Chemistry      Component Value Date/Time   NA 143 07/27/2014 1324   NA 139 01/21/2013 1028   K 3.8 07/27/2014 1324   K 3.6 01/21/2013 1028   CL 101 01/21/2013 1028   CO2 26 07/27/2014 1324   CO2 24 01/21/2013 1028   BUN 24.2 07/27/2014 1324   BUN 16 01/21/2013 1028   CREATININE 0.8 07/27/2014 1324   CREATININE 0.77 01/21/2013 1028   CREATININE 0.74 06/14/2012 1257      Component Value  Date/Time   CALCIUM 9.5 07/27/2014 1324   CALCIUM 9.2 01/21/2013 1028   ALKPHOS 74 07/27/2014 1324   ALKPHOS 86 04/20/2010 0942   AST 26 07/27/2014 1324   AST 21 04/20/2010 0942   ALT 14 07/27/2014 1324   ALT 18 04/20/2010 0942   BILITOT 0.34 07/27/2014 1324   BILITOT 0.5 04/20/2010 0942      STUDIES: CLINICAL DATA: 81 year old with prior malignant bilateral mastectomies, 2002 on the right and 2004 on the left, surgical resection of a left chest wall recurrence in October, 2014. She presents now with a small palpable lump in the upper left chest wall which she initially noted approximately 6 months ago and she states there has been no interval increase in size.  EXAM: ULTRASOUND OF THE LEFT BREAST/CHEST WALL  COMPARISON: None.  FINDINGS: On physical exam, there is a palpable superficial pea-sized mass in the upper left chest wall approximately 3 cm below the clavicle in the midclavicular line. This corresponds to what the patient is feeling.  Ultrasound is performed, showing an oval-shaped circumscribed horizontally oriented anechoic mass with adherent echogenic internal debris and excellent acoustic enhancement in the subcutaneous fat of the left chest wall approximately 3 cm below the mid clavicle. There is no internal color Doppler flow. Color signal within the mass is due to internal motion of the debris.  IMPRESSION: Likely benign fat necrosis/ oil cyst in the subcutaneous fat of the left chest wall approximately 3 cm below the mid clavicle corresponding to the area of palpable concern.  RECOMMENDATION: The options of short-term interval sonographic followup and ultrasound-guided core needle biopsy were discussed at length with the patient. She wishes to have short-term imaging followup. Therefore, ultrasound of the left chest wall is recommended in 6 months.  The patient was counseled that should the mass increase in size prior to the 6 month  ultrasound, she should contact the [facility] and the ultrasound can be performed sooner.  I have discussed the findings and recommendations with the patient. Results were also provided in writing at the conclusion of the visit. If applicable, a reminder letter  will be sent to the patient regarding the next appointment.  BI-RADS CATEGORY 3: Probably benign.   Electronically Signed  By: Evangeline Dakin M.D.  On: 01/24/2014 11:48 Most recent bone density scan on 12/27/13 showed a t-score of -1.8 (osteopenia)   ASSESSMENT: 81 y.o. [place] woman  (1) status post right lumpectomy and axillary dissection in 1993 in [place] for what appears to have been a stage I breast cancer, treated with radiation and then tamoxifen for 5 years  (2) status post right mastectomy 2002 for a recurrence in the right breast, treated adjuvantly with tamoxifen for an additional 5 years  (3) status post left mastectomy and sentinel lymph node sampling 03/07/2003 for a pT1b, pN0, stage IA mucinous breast cancer, grade 2; no radiation therapy was given. Tamoxifen was continued until 2009  (4) excisional biopsy of a left chest wall mass 01/26/2013 showing carcinoma consistent with invasive ductal carcinoma, estrogen receptor 100% positive, progesterone receptor 0% positive, with an MIB-1 of 22% and no HER-2 amplification.   (5)  PET scan on 03/03/2013 confirmed extensive metastatic adenopathy in the left axilla, left subpectoral musculature, and left supraclavicular region  (6)  started on anastrozole daily beginning 03/01/2013, interrupted during radiation therapy, resumed March 2015, discontinued March 2016 because of fatigue and arthralgias  (7) radiation therapy completed 05/23/2013  (8) Osteopenia, zometa yearly started 07/27/2014  (9) tamoxifen started 07/27/2014  PLAN: Chessa was unable to tolerate anastrozole. We reviewed the other aromatase inhibitors, and also the  possibility of tamoxifen. She did very well with tamoxifen remotely, and would prefer that. We did discuss the possible toxicities, side effects and complications of that drug particularly the risk of blood clots. Endometrial cancer is not a concern that she is status post hysterectomy.  We are proceeding with zolendronate today. She tells me she has been going to the dentist on a regular basis and that no extractions are planned.  She is scheduled to see [place] again in about 3 months to make sure she is tolerating the tamoxifen well. We will repeat a PET scan before that visit. She knows to call for any problems that may develop before then.  Chauncey Cruel, MD   07/27/2014 2:12 PM

## 2014-07-27 NOTE — Patient Instructions (Signed)

## 2014-07-27 NOTE — Telephone Encounter (Signed)
per pof ot sch pt appt-gave pt copy of sch-adv pt that [facility] will call to [facility] PET

## 2014-07-28 ENCOUNTER — Other Ambulatory Visit: Payer: Self-pay | Admitting: *Deleted

## 2014-08-04 ENCOUNTER — Other Ambulatory Visit: Payer: Self-pay | Admitting: *Deleted

## 2014-08-04 MED ORDER — TAMOXIFEN CITRATE 20 MG PO TABS: 20.0000 mg | ORAL_TABLET | Freq: Every day | ORAL | Status: DC

## 2014-08-04 NOTE — Telephone Encounter (Signed)
Pt called to [facility] to state prescription for tamoxifen has not been received per [facility] mail order pharmacy.  This RN reviewed noted as escribed on 07/27/2014- this RN printed prescription and faxed to [facility].

## 2014-09-01 ENCOUNTER — Other Ambulatory Visit: Payer: Self-pay | Admitting: Family Medicine

## 2014-09-01 DIAGNOSIS — N6320 Unspecified lump in the left breast, unspecified quadrant: Secondary | ICD-10-CM

## 2014-09-01 DIAGNOSIS — Z9013 Acquired absence of bilateral breasts and nipples: Secondary | ICD-10-CM

## 2014-09-01 DIAGNOSIS — R222 Localized swelling, mass and lump, trunk: Secondary | ICD-10-CM

## 2014-09-06 ENCOUNTER — Ambulatory Visit
Admission: RE | Admit: 2014-09-06 | Discharge: 2014-09-06 | Disposition: A | Discharge status: Home / Self Care | Payer: Commercial Managed Care - HMO | Source: Ambulatory Visit | Attending: Family Medicine | Admitting: Family Medicine

## 2014-09-06 DIAGNOSIS — R222 Localized swelling, mass and lump, trunk: Secondary | ICD-10-CM

## 2014-09-06 DIAGNOSIS — Z9013 Acquired absence of bilateral breasts and nipples: Secondary | ICD-10-CM

## 2014-09-12 ENCOUNTER — Other Ambulatory Visit: Payer: Self-pay | Admitting: Surgery

## 2014-09-18 ENCOUNTER — Encounter (HOSPITAL_BASED_OUTPATIENT_CLINIC_OR_DEPARTMENT_OTHER): Payer: Self-pay | Admitting: *Deleted

## 2014-09-18 ENCOUNTER — Encounter (HOSPITAL_BASED_OUTPATIENT_CLINIC_OR_DEPARTMENT_OTHER)
Admission: RE | Admit: 2014-09-18 | Discharge: 2014-09-18 | Disposition: A | Discharge status: Home / Self Care | Payer: Commercial Managed Care - HMO | Source: Ambulatory Visit | Attending: Surgery | Admitting: Surgery

## 2014-09-18 DIAGNOSIS — D367 Benign neoplasm of other specified sites: Secondary | ICD-10-CM | POA: Diagnosis not present

## 2014-09-18 DIAGNOSIS — E119 Type 2 diabetes mellitus without complications: Secondary | ICD-10-CM | POA: Diagnosis not present

## 2014-09-18 DIAGNOSIS — Z90710 Acquired absence of both cervix and uterus: Secondary | ICD-10-CM | POA: Diagnosis not present

## 2014-09-18 DIAGNOSIS — Z7982 Long term (current) use of aspirin: Secondary | ICD-10-CM | POA: Diagnosis not present

## 2014-09-18 DIAGNOSIS — Z87891 Personal history of nicotine dependence: Secondary | ICD-10-CM | POA: Diagnosis not present

## 2014-09-18 DIAGNOSIS — Z6834 Body mass index (BMI) 34.0-34.9, adult: Secondary | ICD-10-CM | POA: Diagnosis not present

## 2014-09-18 DIAGNOSIS — L989 Disorder of the skin and subcutaneous tissue, unspecified: Secondary | ICD-10-CM | POA: Diagnosis present

## 2014-09-18 DIAGNOSIS — Z794 Long term (current) use of insulin: Secondary | ICD-10-CM | POA: Diagnosis not present

## 2014-09-18 DIAGNOSIS — Z853 Personal history of malignant neoplasm of breast: Secondary | ICD-10-CM | POA: Diagnosis not present

## 2014-09-18 DIAGNOSIS — L720 Epidermal cyst: Secondary | ICD-10-CM | POA: Diagnosis not present

## 2014-09-18 DIAGNOSIS — Z8249 Family history of ischemic heart disease and other diseases of the circulatory system: Secondary | ICD-10-CM | POA: Diagnosis not present

## 2014-09-18 DIAGNOSIS — I10 Essential (primary) hypertension: Secondary | ICD-10-CM | POA: Diagnosis not present

## 2014-09-18 DIAGNOSIS — Z9013 Acquired absence of bilateral breasts and nipples: Secondary | ICD-10-CM | POA: Diagnosis not present

## 2014-09-18 LAB — BASIC METABOLIC PANEL
ANION GAP: 9 (ref 5–15)
BUN: 16 mg/dL (ref 6–20)
CHLORIDE: 106 mmol/L (ref 101–111)
CO2: 25 mmol/L (ref 22–32)
CREATININE: 0.8 mg/dL (ref 0.44–1.00)
Calcium: 9.3 mg/dL (ref 8.9–10.3)
Glucose, Bld: 96 mg/dL (ref 65–99)
POTASSIUM: 3.8 mmol/L (ref 3.5–5.1)
Sodium: 140 mmol/L (ref 135–145)

## 2014-09-19 NOTE — H&P (Signed)
Tami Schmidt 09/12/2014 9:47 AM Location: [facility] Patient #: [id number] DOB: January 13, 1933 Married / Language: English / Race: Black or African American Female  History of Present Illness (Tami Jaquess [name] MD; 09/12/2014 9:55 AM) Patient words: lump along cheastwall.  The patient is a 81 year old female who presents with a breast mass. This is a long-term breast cancer patient of mine. She has had a previous left breast mastectomy and recently had a chest wall recurrence which was excised in October 2014. She has had a small lesion near her sternum developed along the old mastectomy scar. Ultrasound showed an irregular mass. Excisional biopsy has been recommended. She is otherwise without complaints   Other Problems Tami Schmidt, CMA; 09/12/2014 9:47 AM) Diabetes Mellitus High blood pressure Lump In Breast Umbilical Hernia Repair  Past Surgical History Tami Schmidt, CMA; 09/12/2014 9:47 AM) Breast Mass; Local Excision Bilateral. Hysterectomy (not due to cancer) - Complete Mastectomy Bilateral.  Diagnostic Studies History Tami Schmidt, CMA; 09/12/2014 9:47 AM) Colonoscopy 1-5 years ago  Allergies Tami Schmidt, CMA; 09/12/2014 9:48 AM) Bactrim *Anti-infective Agents - Misc.** Lisinopril *CHEMICALS* Vasotec *ANTIHYPERTENSIVES*  Medication History Tami Schmidt, CMA; 09/12/2014 9:49 AM) AmLODIPine Besylate (10MG  Tablet, Oral) Active. Fenofibrate (160MG  Tablet, Oral) Active. HumuLIN N (100UNIT/ML Suspension, Subcutaneous) Active. MetFORMIN HCl (500MG  Tablet, Oral) Active. Losartan Potassium-HCTZ (100-25MG  Tablet, Oral) Active. Tamoxifen Citrate (20MG  Tablet, Oral) Active. Fish Oil (1000MG  Capsule, Oral) Active. Medications Reconciled  Social History Tami Schmidt, [place]; 09/12/2014 9:47 AM) Alcohol use Occasional alcohol use. Caffeine use Coffee, Tea. No drug use Tobacco use Former smoker.  Family History Tami Schmidt, [place]; 09/12/2014 9:47  AM) Heart Disease Father, Mother, Sister. Heart disease in female family member before age 79 Hypertension Father, Sister.  Pregnancy / Birth History Tami Schmidt, CMA; 09/12/2014 9:47 AM) Age at menarche 89 years. Age of menopause 79-60 Gravida 5 Maternal age 31-20 Para 5  Review of Systems Tami Schmidt CMA; 09/12/2014 9:47 AM) General Not Present- Appetite Loss, Chills, Fatigue, Fever, Night Sweats, Weight Gain and Weight Loss. HEENT Not Present- Earache, Hearing Loss, Hoarseness, Nose Bleed, Oral Ulcers, Ringing in the Ears, Seasonal Allergies, Sinus Pain, Sore Throat, Visual Disturbances, Wears glasses/contact lenses and Yellow Eyes. Respiratory Not Present- Bloody sputum, Chronic Cough, Difficulty Breathing, Snoring and Wheezing. Breast Present- Breast Mass. Not Present- Breast Pain, Nipple Discharge and Skin Changes. Cardiovascular Not Present- Chest Pain, Difficulty Breathing Lying Down, Leg Cramps, Palpitations, Rapid Heart Rate, Shortness of Breath and Swelling of Extremities. Gastrointestinal Not Present- Abdominal Pain, Bloating, Bloody Stool, Change in Bowel Habits, Chronic diarrhea, Constipation, Difficulty Swallowing, Excessive gas, Gets full quickly at meals, Hemorrhoids, Indigestion, Nausea, Rectal Pain and Vomiting. Female Genitourinary Not Present- Frequency, Nocturia, Painful Urination, Pelvic Pain and Urgency. Musculoskeletal Not Present- Back Pain, Joint Pain, Joint Stiffness, Muscle Pain, Muscle Weakness and Swelling of Extremities. Neurological Not Present- Decreased Memory, Fainting, Headaches, Numbness, Seizures, Tingling, Tremor, Trouble walking and Weakness. Psychiatric Not Present- Anxiety, Bipolar, Change in Sleep Pattern, Depression, Fearful and Frequent crying. Endocrine Not Present- Cold Intolerance, Excessive Hunger, Hair Changes, Heat Intolerance, Hot flashes and New Diabetes. Hematology Not Present- Easy Bruising, Excessive bleeding, Gland problems,  HIV and Persistent Infections.   Vitals Tami Schmidt CMA; 09/12/2014 9:50 AM) 09/12/2014 9:49 AM Weight: 216 lb Height: 66in Body Surface Area: 2.14 m Body Mass Index: 34.86 kg/m Temp.: 98.27F(Oral)  Pulse: 84 (Regular)  Resp.: 17 (Unlabored)  BP: 138/76 (Sitting, Left Arm, Standard)    Physical Exam (Keelyn Fjelstad [name] MD; 09/12/2014 9:56  AM) The physical exam findings are as follows: Note:On exam, there is a slightly less than 1 cm skin lesion which is visible at her mastectomy scar along the border of the left side of the sternum. It almost has the appearance of a sebaceous cyst. The chest wall is otherwise soft with no other palpable nodules except at her old lipoma incision Lungs clear CV RRR Abd soft NT/ND    Assessment & Plan (Melvena Vink [name] MD; 09/12/2014 9:57 AM) SKIN LESION OF BREAST (611.9  N64.9) Impression: Because of her previous chest wall recurrence, excisional biopsy of the skin lesion is recommended to rule out malignancy. This may very well be another chest wall recurrence of her breast cancer. I discussed the diagnosis with her in detail. I discussed the procedure. I discussed the risks which includes but is not limited to bleeding, infection, need for further surgery, etc. She understands and wishes to proceed with surgery.

## 2014-09-20 ENCOUNTER — Encounter (HOSPITAL_BASED_OUTPATIENT_CLINIC_OR_DEPARTMENT_OTHER)
Admission: RE | Disposition: A | Discharge status: Home / Self Care | Payer: Self-pay | Source: Ambulatory Visit | Attending: Surgery

## 2014-09-20 ENCOUNTER — Ambulatory Visit (HOSPITAL_BASED_OUTPATIENT_CLINIC_OR_DEPARTMENT_OTHER)
Admission: RE | Admit: 2014-09-20 | Discharge: 2014-09-20 | Disposition: A | Discharge status: Home / Self Care | Payer: Commercial Managed Care - HMO | Source: Ambulatory Visit | Attending: Surgery | Admitting: Surgery

## 2014-09-20 ENCOUNTER — Ambulatory Visit (HOSPITAL_BASED_OUTPATIENT_CLINIC_OR_DEPARTMENT_OTHER): Payer: Commercial Managed Care - HMO | Admitting: Anesthesiology

## 2014-09-20 ENCOUNTER — Encounter (HOSPITAL_BASED_OUTPATIENT_CLINIC_OR_DEPARTMENT_OTHER): Payer: Self-pay | Admitting: Anesthesiology

## 2014-09-20 DIAGNOSIS — Z9013 Acquired absence of bilateral breasts and nipples: Secondary | ICD-10-CM | POA: Insufficient documentation

## 2014-09-20 DIAGNOSIS — Z853 Personal history of malignant neoplasm of breast: Secondary | ICD-10-CM | POA: Insufficient documentation

## 2014-09-20 DIAGNOSIS — Z6834 Body mass index (BMI) 34.0-34.9, adult: Secondary | ICD-10-CM | POA: Insufficient documentation

## 2014-09-20 DIAGNOSIS — Z8249 Family history of ischemic heart disease and other diseases of the circulatory system: Secondary | ICD-10-CM | POA: Insufficient documentation

## 2014-09-20 DIAGNOSIS — I10 Essential (primary) hypertension: Secondary | ICD-10-CM | POA: Diagnosis not present

## 2014-09-20 DIAGNOSIS — L720 Epidermal cyst: Secondary | ICD-10-CM | POA: Insufficient documentation

## 2014-09-20 DIAGNOSIS — Z90710 Acquired absence of both cervix and uterus: Secondary | ICD-10-CM | POA: Insufficient documentation

## 2014-09-20 DIAGNOSIS — D367 Benign neoplasm of other specified sites: Secondary | ICD-10-CM | POA: Insufficient documentation

## 2014-09-20 DIAGNOSIS — Z87891 Personal history of nicotine dependence: Secondary | ICD-10-CM | POA: Insufficient documentation

## 2014-09-20 DIAGNOSIS — E119 Type 2 diabetes mellitus without complications: Secondary | ICD-10-CM | POA: Insufficient documentation

## 2014-09-20 DIAGNOSIS — Z794 Long term (current) use of insulin: Secondary | ICD-10-CM | POA: Insufficient documentation

## 2014-09-20 DIAGNOSIS — L989 Disorder of the skin and subcutaneous tissue, unspecified: Secondary | ICD-10-CM | POA: Insufficient documentation

## 2014-09-20 DIAGNOSIS — Z7982 Long term (current) use of aspirin: Secondary | ICD-10-CM | POA: Insufficient documentation

## 2014-09-20 HISTORY — PX: MASS EXCISION: SHX2000

## 2014-09-20 HISTORY — DX: Lymphedema, not elsewhere classified: I89.0

## 2014-09-20 LAB — GLUCOSE, CAPILLARY: Glucose-Capillary: 115 mg/dL — ABNORMAL HIGH (ref 65–99)

## 2014-09-20 SURGERY — EXCISION MASS
Anesthesia: Monitor Anesthesia Care | Site: Chest | Laterality: Left

## 2014-09-20 MED ORDER — PROPOFOL INFUSION 10 MG/ML OPTIME
INTRAVENOUS | Status: DC | PRN
  Administered 2014-09-20: 50 ug/kg/min via INTRAVENOUS

## 2014-09-20 MED ORDER — FENTANYL CITRATE (PF) 100 MCG/2ML IJ SOLN
INTRAMUSCULAR | Status: DC | PRN
Start: 2014-09-20 — End: 2014-09-20
  Administered 2014-09-20 (×2): 50 ug via INTRAVENOUS

## 2014-09-20 MED ORDER — LACTATED RINGERS IV SOLN
INTRAVENOUS | Status: DC | PRN
  Administered 2014-09-20: 1000 mL
  Administered 2014-09-20: 10:00:00 via INTRAVENOUS

## 2014-09-20 MED ORDER — ONDANSETRON HCL 4 MG/2ML IJ SOLN
INTRAMUSCULAR | Status: DC | PRN
  Administered 2014-09-20: 4 mg via INTRAVENOUS

## 2014-09-20 MED ORDER — LIDOCAINE HCL (PF) 1 % IJ SOLN
INTRAMUSCULAR | Status: AC
  Filled 2014-09-20: qty 30

## 2014-09-20 MED ORDER — CEFAZOLIN SODIUM-DEXTROSE 2-3 GM-% IV SOLR
INTRAVENOUS | Status: DC | PRN
  Administered 2014-09-20: 2 g via INTRAVENOUS

## 2014-09-20 MED ORDER — LIDOCAINE HCL (PF) 1 % IJ SOLN
INTRAMUSCULAR | Status: DC | PRN
  Administered 2014-09-20: 10 mL

## 2014-09-20 MED ORDER — CEFAZOLIN SODIUM-DEXTROSE 2-3 GM-% IV SOLR
INTRAVENOUS | Status: AC
  Filled 2014-09-20: qty 50

## 2014-09-20 MED ORDER — BUPIVACAINE-EPINEPHRINE (PF) 0.5% -1:200000 IJ SOLN
INTRAMUSCULAR | Status: AC
  Filled 2014-09-20: qty 30

## 2014-09-20 MED ORDER — FENTANYL CITRATE (PF) 100 MCG/2ML IJ SOLN
INTRAMUSCULAR | Status: AC
  Filled 2014-09-20: qty 6

## 2014-09-20 MED ORDER — LIDOCAINE HCL (CARDIAC) 20 MG/ML IV SOLN
INTRAVENOUS | Status: DC | PRN
  Administered 2014-09-20: 50 mg via INTRAVENOUS

## 2014-09-20 MED ORDER — SODIUM BICARBONATE 4 % IV SOLN
INTRAVENOUS | Status: AC
  Filled 2014-09-20: qty 5

## 2014-09-20 MED ORDER — HYDROCODONE-ACETAMINOPHEN 5-325 MG PO TABS: 1.0000 | ORAL_TABLET | ORAL | Status: DC | PRN

## 2014-09-20 SURGICAL SUPPLY — 48 items
BLADE CLIPPER SURG (BLADE) IMPLANT
BLADE HEX COATED 2.75 (ELECTRODE) ×3 IMPLANT
BLADE SURG 15 STRL LF DISP TIS (BLADE) ×1 IMPLANT
BLADE SURG 15 STRL SS (BLADE) ×2
CANISTER SUCT 1200ML W/VALVE (MISCELLANEOUS) IMPLANT
CHLORAPREP W/TINT 26ML (MISCELLANEOUS) ×3 IMPLANT
CLOSURE WOUND 1/2 X4 (GAUZE/BANDAGES/DRESSINGS)
COVER BACK TABLE 60X90IN (DRAPES) ×3 IMPLANT
COVER MAYO STAND STRL (DRAPES) ×3 IMPLANT
DECANTER SPIKE VIAL GLASS SM (MISCELLANEOUS) IMPLANT
DRAPE LAPAROTOMY 100X72 PEDS (DRAPES) ×3 IMPLANT
DRAPE UTILITY XL STRL (DRAPES) ×3 IMPLANT
DRSG TEGADERM 4X4.75 (GAUZE/BANDAGES/DRESSINGS) IMPLANT
ELECT REM PT RETURN 9FT ADLT (ELECTROSURGICAL) ×3
ELECTRODE REM PT RTRN 9FT ADLT (ELECTROSURGICAL) ×1 IMPLANT
GLOVE BIO SURGEON STRL SZ7 (GLOVE) ×3 IMPLANT
GLOVE BIOGEL PI IND STRL 7.5 (GLOVE) ×1 IMPLANT
GLOVE BIOGEL PI INDICATOR 7.5 (GLOVE) ×2
GLOVE EXAM NITRILE PF MED BLUE (GLOVE) ×3 IMPLANT
GLOVE SURG SIGNA 7.5 PF LTX (GLOVE) ×3 IMPLANT
GOWN STRL REUS W/ TWL LRG LVL3 (GOWN DISPOSABLE) ×1 IMPLANT
GOWN STRL REUS W/ TWL XL LVL3 (GOWN DISPOSABLE) ×1 IMPLANT
GOWN STRL REUS W/TWL LRG LVL3 (GOWN DISPOSABLE) ×2
GOWN STRL REUS W/TWL XL LVL3 (GOWN DISPOSABLE) ×2
LIQUID BAND (GAUZE/BANDAGES/DRESSINGS) ×3 IMPLANT
NEEDLE HYPO 25X1 1.5 SAFETY (NEEDLE) ×3 IMPLANT
NS IRRIG 1000ML POUR BTL (IV SOLUTION) IMPLANT
PACK BASIN DAY SURGERY FS (CUSTOM PROCEDURE TRAY) ×3 IMPLANT
PENCIL BUTTON HOLSTER BLD 10FT (ELECTRODE) ×3 IMPLANT
SLEEVE SCD COMPRESS KNEE MED (MISCELLANEOUS) ×3 IMPLANT
SPONGE GAUZE 4X4 12PLY STER LF (GAUZE/BANDAGES/DRESSINGS) ×3 IMPLANT
SPONGE LAP 4X18 X RAY DECT (DISPOSABLE) ×3 IMPLANT
STRIP CLOSURE SKIN 1/2X4 (GAUZE/BANDAGES/DRESSINGS) IMPLANT
SUT MNCRL AB 4-0 PS2 18 (SUTURE) ×3 IMPLANT
SUT PROLENE 3 0 PS 2 (SUTURE) IMPLANT
SUT SILK 2 0 SH (SUTURE) ×3 IMPLANT
SUT VIC AB 2-0 SH 27 (SUTURE)
SUT VIC AB 2-0 SH 27XBRD (SUTURE) IMPLANT
SUT VIC AB 3-0 SH 27 (SUTURE) ×2
SUT VIC AB 3-0 SH 27X BRD (SUTURE) ×1 IMPLANT
SYR BULB 3OZ (MISCELLANEOUS) IMPLANT
SYR CONTROL 10ML LL (SYRINGE) ×3 IMPLANT
TOWEL OR 17X24 6PK STRL BLUE (TOWEL DISPOSABLE) ×3 IMPLANT
TOWEL OR NON WOVEN STRL DISP B (DISPOSABLE) ×3 IMPLANT
TRAY DSU PREP LF (CUSTOM PROCEDURE TRAY) IMPLANT
TUBE CONNECTING 20'X1/4 (TUBING)
TUBE CONNECTING 20X1/4 (TUBING) IMPLANT
YANKAUER SUCT BULB TIP NO VENT (SUCTIONS) IMPLANT

## 2014-09-20 NOTE — Discharge Instructions (Signed)
Ok to shower starting tomorrow   Post Anesthesia Home Care Instructions  Activity: Get plenty of rest for the remainder of the day. A responsible adult should stay with you for 24 hours following the procedure.  For the next 24 hours, DO NOT: -Drive a car -Paediatric nurse -Drink alcoholic beverages -Take any medication unless instructed by your physician -Make any legal decisions or sign important papers.  Meals: Start with liquid foods such as gelatin or soup. Progress to regular foods as tolerated. Avoid greasy, spicy, heavy foods. If nausea and/or vomiting occur, drink only clear liquids until the nausea and/or vomiting subsides. Call your physician if vomiting continues.  Special Instructions/Symptoms: Your throat may feel dry or sore from the anesthesia or the breathing tube placed in your throat during surgery. If this causes discomfort, gargle with warm salt water. The discomfort should disappear within 24 hours.  If you had a scopolamine patch placed behind your ear for the management of post- operative nausea and/or vomiting:  1. The medication in the patch is effective for 72 hours, after which it should be removed.  Wrap patch in a tissue and discard in the trash. Wash hands thoroughly with soap and water. 2. You may remove the patch earlier than 72 hours if you experience unpleasant side effects which may include dry mouth, dizziness or visual disturbances. 3. Avoid touching the patch. Wash your hands with soap and water after contact with the patch.

## 2014-09-20 NOTE — Transfer of Care (Signed)
Immediate Anesthesia Transfer of Care Note  Patient: Tami Schmidt  Procedure(s) Performed: Procedure(s): EXCISION OF LEFT CHEST WALL MASS (Left)  Patient Location: PACU  Anesthesia Type:MAC  Level of Consciousness: awake, alert  and patient cooperative  Airway & Oxygen Therapy: Patient Spontanous Breathing and Patient connected to face mask oxygen  Post-op Assessment: Report given to RN and Post -op Vital signs reviewed and stable  Post vital signs: Reviewed and stable  Last Vitals:  Filed Vitals:   09/20/14 0940  BP: 143/56  Pulse: 82  Temp: 36.7 C  Resp: 20    Complications: No apparent anesthesia complications

## 2014-09-20 NOTE — Anesthesia Postprocedure Evaluation (Signed)
  Anesthesia Post-op Note  Patient: Tami Schmidt  Procedure(s) Performed: Procedure(s): EXCISION OF LEFT CHEST WALL MASS (Left)  Patient Location: PACU  Anesthesia Type: MAC   Level of Consciousness: awake, alert  and oriented  Airway and Oxygen Therapy: Patient Spontanous Breathing  Post-op Pain: none  Post-op Assessment: Post-op Vital signs reviewed  Post-op Vital Signs: Reviewed  Last Vitals:  Filed Vitals:   09/20/14 1115  BP: 131/60  Pulse: 75  Temp: 37.1 C  Resp: 18    Complications: No apparent anesthesia complications

## 2014-09-20 NOTE — Anesthesia Preprocedure Evaluation (Signed)
Anesthesia Evaluation  Patient identified by MRN, date of birth, ID band Patient awake    Reviewed: Allergy & Precautions, NPO status , Patient's Chart, lab work & pertinent test results  Airway Mallampati: I  TM Distance: >3 FB Neck ROM: Full    Dental  (+) Teeth Intact, Dental Advisory Given   Pulmonary former smoker,  breath sounds clear to auscultation        Cardiovascular hypertension, Pt. on medications Rhythm:Regular Rate:Normal     Neuro/Psych    GI/Hepatic   Endo/Other  diabetes, Well Controlled, Type 2, Oral Hypoglycemic Agents, Insulin DependentMorbid obesity  Renal/GU      Musculoskeletal   Abdominal   Peds  Hematology   Anesthesia Other Findings   Reproductive/Obstetrics                             Anesthesia Physical Anesthesia Plan  ASA: III  Anesthesia Plan: MAC   Post-op Pain Management:    Induction: Intravenous  Airway Management Planned: Simple Face Mask  Additional Equipment:   Intra-op Plan:   Post-operative Plan:   Informed Consent: I have reviewed the patients History and Physical, chart, labs and discussed the procedure including the risks, benefits and alternatives for the proposed anesthesia with the patient or authorized representative who has indicated his/her understanding and acceptance.   Dental advisory given  Plan Discussed with: CRNA, Anesthesiologist and Surgeon  Anesthesia Plan Comments:         Anesthesia Quick Evaluation

## 2014-09-20 NOTE — Interval H&P Note (Signed)
History and Physical Interval Note: no change in H and P  09/20/2014 9:50 AM  Tami Schmidt  has presented today for surgery, with the diagnosis of Left Chest Wall Lesion  The various methods of treatment have been discussed with the patient and family. After consideration of risks, benefits and other options for treatment, the patient has consented to  Procedure(s): EXCISION OF LEFT CHEST WALL MASS (Left) as a surgical intervention .  The patient's history has been reviewed, patient examined, no change in status, stable for surgery.  I have reviewed the patient's chart and labs.  Questions were answered to the patient's satisfaction.     [name] A

## 2014-09-20 NOTE — Anesthesia Procedure Notes (Signed)
Procedure Name: MAC Date/Time: 09/20/2014 10:05 AM Performed by: Marrianne Mood Pre-anesthesia Checklist: Patient identified, Timeout performed, Emergency Drugs available, Suction available and Patient being monitored Patient Re-evaluated:Patient Re-evaluated prior to inductionOxygen Delivery Method: Simple face mask

## 2014-09-20 NOTE — Op Note (Signed)
EXCISION OF LEFT CHEST WALL MASS  Procedure Note  Tami Schmidt 09/20/2014   Pre-op Diagnosis: Left Chest Wall Lesion     Post-op Diagnosis: same  Procedure(s): EXCISION OF LEFT CHEST WALL MASS (1 cm)  Surgeon(s): Coralie Keens, MD  Anesthesia: Monitor Anesthesia Care  Staff:  Circulator: Ted Mcalpine, RN Relief Circulator: Faythe Dingwall, RN Scrub Person: Maurene Capes, RN Circulator Assistant: Beryle Flock Bouchillon, RN  Estimated Blood Loss: Minimal               Specimens: sent to path          [facility] A   Date: 09/20/2014  Time: 10:31 AM

## 2014-09-21 ENCOUNTER — Encounter (HOSPITAL_BASED_OUTPATIENT_CLINIC_OR_DEPARTMENT_OTHER): Payer: Self-pay | Admitting: Surgery

## 2014-09-21 NOTE — Op Note (Signed)
NAMEDELORISE, HUNKELE NO.:  [id number]  MEDICAL RECORD NO.:  [id number]  LOCATION:                               FACILITY:  [facility]  PHYSICIAN:  Coralie Keens, M.D. DATE OF BIRTH:  05/14/1932  DATE OF PROCEDURE:  09/20/2014 DATE OF DISCHARGE:  09/20/2014                              OPERATIVE REPORT   PREOPERATIVE DIAGNOSIS:  Left chest wall skin lesion.  POSTOPERATIVE DIAGNOSIS:  Left chest wall skin lesion.  PROCEDURE:  Excision of 1 cm left chest wall skin lesion.  SURGEON:  Coralie Keens, M.D.  ANESTHESIA:  1% lidocaine and monitored anesthesia care.  ESTIMATED BLOOD LOSS:  Minimal.  INDICATIONS:  This is a 81 year old female, who has had previous mastectomies bilaterally.  She has had a left chest wall,  recurrent breast cancer in the past.  She now has abnormal appearing skin lesion along the mastectomy scar medially at the sternum.  A decision had been made to proceed with removal of this area for histologic evaluation to rule out recurrent cancer.  PROCEDURE IN DETAIL:  The patient was brought to the operating room and identified as [employer].  She was placed supine on the operating table and anesthesia was induced.  Her chest was then prepped and draped in usual sterile fashion.  I anesthetized the skin around the abnormal- appearing skin lesion with lidocaine.  I then made elliptical incision going down to the chest wall subcutaneous tissue with a scalpel.  I then excised the lesion in its entirety with a scalpel.  I then marked at the 12 o'clock position and sent to Pathology for evaluation.  I then slightly mobilized the superior and inferior skin with a cautery. Hemostasis was achieved with the cautery.  I then closed the subcutaneous tissue with interrupted 3-0 Vicryl sutures and closed the skin with a running 4-0 Monocryl.  Liquid band was then applied.  The patient tolerated the procedure well.  All the counts were correct  at the end of procedure.  The patient was then taken in stable condition from the operating room to recovery room.     Coralie Keens, M.D.     DB/MEDQ  D:  09/20/2014  T:  09/20/2014  Job:  [id number]

## 2014-10-11 ENCOUNTER — Ambulatory Visit (INDEPENDENT_AMBULATORY_CARE_PROVIDER_SITE_OTHER): Payer: Commercial Managed Care - HMO | Admitting: Podiatry

## 2014-10-11 ENCOUNTER — Encounter: Payer: Self-pay | Admitting: Podiatry

## 2014-10-11 VITALS — BP 158/76 | HR 82 | Resp 15

## 2014-10-11 DIAGNOSIS — M79671 Pain in right foot: Secondary | ICD-10-CM | POA: Diagnosis not present

## 2014-10-11 DIAGNOSIS — B351 Tinea unguium: Secondary | ICD-10-CM | POA: Diagnosis not present

## 2014-10-11 DIAGNOSIS — Q828 Other specified congenital malformations of skin: Secondary | ICD-10-CM

## 2014-10-11 DIAGNOSIS — E1151 Type 2 diabetes mellitus with diabetic peripheral angiopathy without gangrene: Secondary | ICD-10-CM | POA: Diagnosis not present

## 2014-10-11 DIAGNOSIS — M79606 Pain in leg, unspecified: Secondary | ICD-10-CM | POA: Diagnosis not present

## 2014-10-11 DIAGNOSIS — M79672 Pain in left foot: Secondary | ICD-10-CM

## 2014-10-11 NOTE — Progress Notes (Signed)
   Subjective:    Patient ID: Tami Schmidt, female    DOB: 1932/10/11, 81 y.o.   MRN: [id number]  HPI  Pt presents as type 2 diabetic, she is here for routine foot exam. She c/o b/l callus on the lateral side right over left. Denies any other pains at this time  Review of Systems  Musculoskeletal: Positive for arthralgias.  All other systems reviewed and are negative.      Objective:   Physical Exam        Assessment & Plan:

## 2014-10-12 NOTE — Progress Notes (Signed)
Subjective:     Patient ID: Tami Schmidt, female   DOB: December 26, 1932, 81 y.o.   MRN: [id number]  HPI patient presents with long-term diabetes with painful callus formation structural imbalances and nail disease 1-5 both feet with thickness and discomfort in the corners and hard for her to cut or see   Review of Systems  All other systems reviewed and are negative.      Objective:   Physical Exam  Constitutional: She is oriented to person, place, and time.  Cardiovascular: Intact distal pulses.   Musculoskeletal: Normal range of motion.  Neurological: She is oriented to person, place, and time.  Skin: Skin is warm.  Nursing note and vitals reviewed.  neurovascular status is found to be mildly diminished with diminishment of sharp Dole vibratory in pulses that are palpable but weak. Patient's noted to have forefoot instability with large structural bunion deformity hyperostosis medial aspect first metatarsal head and callus formation sub-first and fifth metatarsals of both feet with thickness and pain when palpated. Nailbeds are thick and yellow with brittle-like debris and painful in the corners 1 through 5 both feet with structural imbalance of the toes causing more compression and pain within the nails themselves     Assessment:     At risk diabetic with structural abnormalities corn callus formation with probable porokeratotic type lesions and nail disease 1-5 both feet with thickness and pain    Plan:     H&P and condition reviewed with patient along with diabetic education rendered to patient. Today I debrided nailbeds 1-5 both feet with no iatrogenic bleeding and lesions on both feet with no iatrogenic bleeding. We discussed possibility for diabetic shoes long-term will hold off at this time and see how she responds to routine care. Reappoint for regular visit or less any issues should occur she is to a point

## 2014-10-17 ENCOUNTER — Ambulatory Visit (HOSPITAL_COMMUNITY)
Admission: RE | Admit: 2014-10-17 | Discharge: 2014-10-17 | Disposition: A | Discharge status: Home / Self Care | Payer: Commercial Managed Care - HMO | Source: Ambulatory Visit | Attending: Oncology | Admitting: Oncology

## 2014-10-17 DIAGNOSIS — C50919 Malignant neoplasm of unspecified site of unspecified female breast: Secondary | ICD-10-CM | POA: Diagnosis not present

## 2014-10-17 DIAGNOSIS — K439 Ventral hernia without obstruction or gangrene: Secondary | ICD-10-CM | POA: Insufficient documentation

## 2014-10-17 DIAGNOSIS — I517 Cardiomegaly: Secondary | ICD-10-CM | POA: Insufficient documentation

## 2014-10-17 DIAGNOSIS — C779 Secondary and unspecified malignant neoplasm of lymph node, unspecified: Secondary | ICD-10-CM | POA: Diagnosis not present

## 2014-10-17 DIAGNOSIS — I7090 Unspecified atherosclerosis: Secondary | ICD-10-CM | POA: Insufficient documentation

## 2014-10-17 DIAGNOSIS — K5730 Diverticulosis of large intestine without perforation or abscess without bleeding: Secondary | ICD-10-CM | POA: Diagnosis not present

## 2014-10-17 DIAGNOSIS — Z923 Personal history of irradiation: Secondary | ICD-10-CM | POA: Diagnosis not present

## 2014-10-17 DIAGNOSIS — K8020 Calculus of gallbladder without cholecystitis without obstruction: Secondary | ICD-10-CM | POA: Insufficient documentation

## 2014-10-17 LAB — GLUCOSE, CAPILLARY: Glucose-Capillary: 85 mg/dL (ref 65–99)

## 2014-10-17 MED ORDER — FLUDEOXYGLUCOSE F - 18 (FDG) INJECTION
9.6100 | Freq: Once | INTRAVENOUS | Status: AC | PRN
  Administered 2014-10-17: 9.61 via INTRAVENOUS

## 2014-11-01 ENCOUNTER — Ambulatory Visit (HOSPITAL_BASED_OUTPATIENT_CLINIC_OR_DEPARTMENT_OTHER): Payer: Commercial Managed Care - HMO | Admitting: Nurse Practitioner

## 2014-11-01 ENCOUNTER — Encounter: Payer: Self-pay | Admitting: Nurse Practitioner

## 2014-11-01 ENCOUNTER — Telehealth: Payer: Self-pay | Admitting: Nurse Practitioner

## 2014-11-01 ENCOUNTER — Other Ambulatory Visit (HOSPITAL_BASED_OUTPATIENT_CLINIC_OR_DEPARTMENT_OTHER): Payer: Commercial Managed Care - HMO

## 2014-11-01 VITALS — BP 145/54 | HR 59 | Temp 97.8°F | Resp 18 | Ht 66.0 in | Wt 220.5 lb

## 2014-11-01 DIAGNOSIS — C50919 Malignant neoplasm of unspecified site of unspecified female breast: Secondary | ICD-10-CM

## 2014-11-01 DIAGNOSIS — I890 Lymphedema, not elsewhere classified: Secondary | ICD-10-CM

## 2014-11-01 DIAGNOSIS — C773 Secondary and unspecified malignant neoplasm of axilla and upper limb lymph nodes: Secondary | ICD-10-CM | POA: Diagnosis not present

## 2014-11-01 DIAGNOSIS — Z170 Estrogen receptor positive status [ER+]: Secondary | ICD-10-CM

## 2014-11-01 DIAGNOSIS — M8580 Other specified disorders of bone density and structure, unspecified site: Secondary | ICD-10-CM

## 2014-11-01 DIAGNOSIS — C7989 Secondary malignant neoplasm of other specified sites: Secondary | ICD-10-CM

## 2014-11-01 DIAGNOSIS — C50912 Malignant neoplasm of unspecified site of left female breast: Secondary | ICD-10-CM

## 2014-11-01 DIAGNOSIS — Z79810 Long term (current) use of selective estrogen receptor modulators (SERMs): Secondary | ICD-10-CM

## 2014-11-01 DIAGNOSIS — C50911 Malignant neoplasm of unspecified site of right female breast: Secondary | ICD-10-CM

## 2014-11-01 LAB — COMPREHENSIVE METABOLIC PANEL (CC13)
ALBUMIN: 3.6 g/dL (ref 3.5–5.0)
ALT: 11 U/L (ref 0–55)
ANION GAP: 7 meq/L (ref 3–11)
AST: 14 U/L (ref 5–34)
Alkaline Phosphatase: 50 U/L (ref 40–150)
BUN: 13.3 mg/dL (ref 7.0–26.0)
CO2: 26 meq/L (ref 22–29)
CREATININE: 0.7 mg/dL (ref 0.6–1.1)
Calcium: 9.1 mg/dL (ref 8.4–10.4)
Chloride: 107 mEq/L (ref 98–109)
GLUCOSE: 95 mg/dL (ref 70–140)
POTASSIUM: 3.8 meq/L (ref 3.5–5.1)
Sodium: 141 mEq/L (ref 136–145)
Total Bilirubin: 0.5 mg/dL (ref 0.20–1.20)
Total Protein: 6.8 g/dL (ref 6.4–8.3)

## 2014-11-01 LAB — CBC WITH DIFFERENTIAL/PLATELET
BASO%: 1.3 % (ref 0.0–2.0)
BASOS ABS: 0.1 10*3/uL (ref 0.0–0.1)
EOS%: 4.5 % (ref 0.0–7.0)
Eosinophils Absolute: 0.2 10*3/uL (ref 0.0–0.5)
HCT: 37.6 % (ref 34.8–46.6)
HEMOGLOBIN: 11.9 g/dL (ref 11.6–15.9)
LYMPH%: 34.4 % (ref 14.0–49.7)
MCH: 24.4 pg — ABNORMAL LOW (ref 25.1–34.0)
MCHC: 31.7 g/dL (ref 31.5–36.0)
MCV: 77.1 fL — AB (ref 79.5–101.0)
MONO#: 0.4 10*3/uL (ref 0.1–0.9)
MONO%: 6.6 % (ref 0.0–14.0)
NEUT#: 2.9 10*3/uL (ref 1.5–6.5)
NEUT%: 53.2 % (ref 38.4–76.8)
Platelets: 203 10*3/uL (ref 145–400)
RBC: 4.88 10*6/uL (ref 3.70–5.45)
RDW: 16.9 % — ABNORMAL HIGH (ref 11.2–14.5)
WBC: 5.5 10*3/uL (ref 3.9–10.3)
lymph#: 1.9 10*3/uL (ref 0.9–3.3)

## 2014-11-01 NOTE — Telephone Encounter (Signed)
Left message to confirm appointment in December. Mailed calendar.

## 2014-11-01 NOTE — Progress Notes (Signed)
ID: [name] OB: [birth year]  MR#: [id number]  CSN#:[id number]  PCP: Lynne Logan, MD GYN:   SUCoralie Keens, MD OTHER MD: Kyung Rudd, MD  CHIEF COMPLAINT:  1)  Hx Right Breast Cancer    2)  Hx Left Breast Cancer, with recurrence and metastatic adenopathy  CURRENT TREATMENT: Tamoxifen  BREAST CANCER HISTORY: From the earlier summary:  Tami Schmidt's history of breast cancer dates back to 1993, when she underwent biopsy of her right breast in [place]. I do not have those details, but she moved to [place] shortly thereafter and it was in [place] where she had her right lumpectomy and axillary lymph node dissection. She had a total of 6 lymph nodes removed, she tells me, and all of them were clear. She does not recall the size of the primary. She was treated with adjuvant radiation and then received tamoxifen between 1993 and 1998.  In 2002 she had a local recurrence in the right breast, and underwent right mastectomy. She received tamoxifen between 2002 and 2007.  In 2004 she underwent a left mastectomy and sentinel lymph node sampling for a 9 mm mucinous carcinoma, grade 2, multifocal, associated with ductal carcinoma in situ. The single sentinel lymph node was negative. Margins were ample. She did not receive adjuvant radiation, but continued on tamoxifen as noted above, until 2007.  More recently, in July of 2014, the patient noted a "pimple" in the left chest wall, superior to her prior mastectomy incision. She brought this to the attention of her surgeon, Dr. Ninfa Linden and he describes a dimple area with a firm mass underneath. He also describes a large lipoma in the left clavicular area and a second one on her abdominal wall. Both of these were symptomatic. All 3 masses were removed 01/26/2013. The pathology from this procedure ([id number]) showed, in the chest wall, and invasive ductal carcinoma measuring grossly 1.3 cm. The lesion abutted the closest inked resection margin. The other  2 masses proved to be lipomas.  The patient's subsequent history is as detailed below  INTERVAL HISTORY: Tami Schmidt returns to clinic today for follow up of her recurrent left breast cancer. At her last visit, she was put back on tamoxifen, because of her intolerance of anastrozole. She feels improved on the tamoxifen, and would like to continue it. Her fatigue has resolved and she feels "great."  REVIEW OF SYSTEMS: Tami Schmidt denies fevers, chills, nausea, vomiting, or changes in bowel or bladder habits. She is eating and drinking well. Her blood sugars are well controlled, and she is happy to announce that she has finally gotten her A1c down below 6.0. She has mild generalized joint aches, but they have not worsened. She denies shortness of breath, chest pain, cough, or palpitations. She has no headaches, dizziness, or weakness. She has chronic left upper arm lymphedema, but she does not wear her sleeve regularly. A detailed review of systems is otherwise stable.   PAST MEDICAL HISTORY: Past Medical History  Diagnosis Date  . Hypertension   . Cancer     breast  . Arthritis   . Breast cancer     b/l mastectomies hx  . Diabetes mellitus     fasting 90-100  . Hypercholesterolemia   . Hx of radiation therapy     breasts hx  . Type II or unspecified type diabetes mellitus without mention of complication, not stated as uncontrolled 03/25/2013  . HTN (hypertension) 03/25/2013  . 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 Lymphedema of  arm     left arm    PAST SURGICAL HISTORY: Past Surgical History  Procedure Laterality Date  . Abdominal hysterectomy    . Hernia repair  04-26-2010  . Breast surgery Bilateral     mastectomy  . Appendectomy    . Ankle surgery Right 1995  . Mass excision Left 01/26/2013    Procedure: EXCISION LEFT CHEST WALL MASS AND LEFT ABDOMNAL WALL MASS;  Surgeon: Harl Bowie, MD;  Location: [facility];  Service: General;  Laterality: Left;  Marland Kitchen Mass excision Left  09/20/2014    Procedure: EXCISION OF LEFT CHEST WALL MASS;  Surgeon: Coralie Keens, MD;  Location: [facility];  Service: General;  Laterality: Left;    FAMILY HISTORY The patient's father died from a heart attack at the age of 55. The patient's mother died at the age of 86 from myocarditis. The patient had no brothers, one sister. There is no history of breast or ovarian cancer in the family to her knowledge  GYNECOLOGIC HISTORY:   (Reviewed 09/22/2013) Menarche age 27, first live birth age 37, the patient is Tami Schmidt P5. She underwent hysterectomy in 1984. She did not use hormone replacement.  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 Their children are: Tami Schmidt who lives in [place] and teaches theater there; Tami Schmidt who works as a Social worker in [place]; Tami Schmidt who lives in [place] and is a "radio personality"; Tami Schmidt, who is a Biomedical scientist in [place], and Tami Schmidt who is a musician living in [place]. The patient has 2 grandchildren. She attends a [employer]    ADVANCED DIRECTIVES: Not in place   HEALTH MAINTENANCE:  (Updated 09/22/2013) History  Substance Use Topics  . Smoking status: Former Smoker    Types: Cigarettes    Quit date: 04/14/1972  . Smokeless tobacco: Never Used  . Alcohol Use: Yes     Comment: occasional    Colonoscopy: 2011  PAP: Status post hysterectomy  Bone density: 1993 - to be repeated in 2015  Lipid panel: not on file  Allergies  Allergen Reactions  . Bactrim Other (See Comments)    Swelling to the mouth and face.  . Lisinopril Other (See Comments)    Swelling to the mouth and face.  Tami Schmidt Other (See Comments)    Swelling to the mouth and face.    Current Outpatient Prescriptions  Medication Sig Dispense Refill  . amLODipine (NORVASC) 10 MG tablet Take 10 mg by mouth daily.    Marland Kitchen aspirin EC 81 MG tablet Take 1  tablet (81 mg total) by mouth daily.    Marland Kitchen ezetimibe (ZETIA) 10 MG tablet Take 10 mg by mouth daily.    . fish oil-omega-3 fatty acids 1000 MG capsule Take 2 g by mouth daily.    . Insulin Isophane Human (HUMULIN N [place]) Inject 20-40 Units into the skin every morning. 40 units in the am    . losartan-hydrochlorothiazide (HYZAAR) 100-25 MG per tablet Take 1 tablet by mouth daily.     . metFORMIN (GLUCOPHAGE) 500 MG tablet Take 500 mg by mouth daily with breakfast.    . tamoxifen (NOLVADEX) 20 MG tablet Take 1 tablet (20 mg total) by mouth daily. 90 tablet 4  . HYDROcodone-acetaminophen (NORCO) 5-325 MG per tablet Take 1-2 tablets by mouth every 4 (four) hours as needed. (Patient not taking: Reported on 10/11/2014) 30  tablet 0   No current facility-administered medications for this visit.    OBJECTIVE: Older African American woman who appears stated age 24 Vitals:   11/01/14 1048  BP: 145/54  Pulse: 59  Temp: 97.8 F (36.6 C)  Resp: 18     Body mass index is 35.61 kg/(m^2).    ECOG FS: 2 Filed Weights   11/01/14 1048  Weight: 220 lb 8 oz (100.018 kg)   Skin: warm, dry  HEENT: sclerae anicteric, conjunctivae pink, oropharynx clear. No thrush or mucositis.  Lymph Nodes: No cervical or supraclavicular lymphadenopathy  Lungs: clear to auscultation bilaterally, no rales, wheezes, or rhonci  Heart: regular rate and rhythm  Abdomen: round, soft, non tender, positive bowel sounds  Musculoskeletal: No focal spinal tenderness, chronic grade 1 left upper extremity lymphedema.  Neuro: non focal, well oriented, positive affect  Breasts: bilateral breasts status post bilateral mastectomies. No evidence of recurrent disease. Bilateral axillae benign.  LAB RESULTS:  Lab Results  Component Value Date   WBC 5.5 11/01/2014   NEUTROABS 2.9 11/01/2014   HGB 11.9 11/01/2014   HCT 37.6 11/01/2014   MCV 77.1* 11/01/2014   PLT 203 11/01/2014      Chemistry      Component Value Date/Time   NA  141 11/01/2014 1026   NA 140 09/18/2014 1440   K 3.8 11/01/2014 1026   K 3.8 09/18/2014 1440   CL 106 09/18/2014 1440   CO2 26 11/01/2014 1026   CO2 25 09/18/2014 1440   BUN 13.3 11/01/2014 1026   BUN 16 09/18/2014 1440   CREATININE 0.7 11/01/2014 1026   CREATININE 0.80 09/18/2014 1440   CREATININE 0.74 06/14/2012 1257      Component Value Date/Time   CALCIUM 9.1 11/01/2014 1026   CALCIUM 9.3 09/18/2014 1440   ALKPHOS 50 11/01/2014 1026   ALKPHOS 86 04/20/2010 0942   AST 14 11/01/2014 1026   AST 21 04/20/2010 0942   ALT 11 11/01/2014 1026   ALT 18 04/20/2010 0942   BILITOT 0.50 11/01/2014 1026   BILITOT 0.5 04/20/2010 0942      STUDIES: Nm Pet Image Restag (ps) Skull Base To Thigh  10/17/2014   CLINICAL DATA:  Subsequent treatment strategy for metastatic breast cancer.  EXAM: NUCLEAR MEDICINE PET SKULL BASE TO THIGH  TECHNIQUE: 9.62 mCi F-18 FDG was injected intravenously. Full-ring PET imaging was performed from the skull base to thigh after the radiotracer. CT data was obtained and used for attenuation correction and anatomic localization.  FASTING BLOOD GLUCOSE:  Value: 85 mg/dl  COMPARISON:  PET-CT 09/16/2013.  FINDINGS: NECK  No hypermetabolic cervical lymph nodes are identified.There are no lesions of the pharyngeal mucosal space.  CHEST  There are no residual hypermetabolic mediastinal, hilar, axillary or internal mammary lymph nodes. Small residual left axillary lymph nodes have an SUV max of 2.0. The dominant left axillary mass has decreased in size, measuring 4.1 x 2.5 cm (previously 5.2 x 2.9 cm). This demonstrates no abnormal metabolic activity. There up postsurgical and post radiation changes within the anterior chest wall with associated low level hypermetabolic activity. No focal mass lesion identified. There is no suspicious pulmonary activity. Cardiomegaly, atherosclerosis and subpleural scarring anteriorly in the left lung are unchanged.  ABDOMEN/PELVIS  There is no  hypermetabolic activity within the liver, adrenal glands, spleen or pancreas. There is no hypermetabolic nodal activity. Fluid collection within the left anterolateral abdominal wall has decreased in size, measuring 5.4 cm maximally on image 122. There is  minimal hypermetabolic activity along the anterior aspect of this fluid collection (SUV max 4.6). Cholelithiasis, diffuse colonic diverticulosis and a broad-based ventral abdominal wall hernia are unchanged. No evidence of bowel incarceration.  SKELETON  There is no hypermetabolic activity to suggest osseous metastatic disease. Low-level synovial activity is present in both shoulders, corresponding with chronic arthropathy.  IMPRESSION: 1. No evidence of residual hypermetabolic disease within the chest, abdomen or pelvis. Small residual left axillary lymph nodes demonstrate activity similar to blood pool. 2. Stable postsurgical and post radiation changes within the anterior chest wall. Left abdominal wall fluid collection has decreased in size. 3. Stable incidental findings including a broad-based ventral abdominal wall hernia and cholelithiasis.   Electronically Signed   By: Richardean Sale M.D.   On: 10/17/2014 11:56   Most recent bone density scan on 12/27/13 showed a t-score of -1.8 (osteopenia)  ASSESSMENT: 81 y.o. [place] woman  (1) status post right lumpectomy and axillary dissection in 1993 in [place] for what appears to have been a stage I breast cancer, treated with radiation and then tamoxifen for 5 years  (2) status post right mastectomy 2002 for a recurrence in the right breast, treated adjuvantly with tamoxifen for an additional 5 years  (3) status post left mastectomy and sentinel lymph node sampling 03/07/2003 for a pT1b, pN0, stage IA mucinous breast cancer, grade 2; no radiation therapy was given. Tamoxifen was continued until 2009  (4) excisional biopsy of a left chest wall mass 01/26/2013 showing carcinoma consistent with invasive  ductal carcinoma, estrogen receptor 100% positive, progesterone receptor 0% positive, with an MIB-1 of 22% and no HER-2 amplification.   (5)  PET scan on 03/03/2013 confirmed extensive metastatic adenopathy in the left axilla, left subpectoral musculature, and left supraclavicular region  (6)  started on anastrozole daily beginning 03/01/2013, interrupted during radiation therapy, resumed March 2015, discontinued March 2016 because of fatigue and arthralgias  (7) radiation therapy completed 05/23/2013  (8) Osteopenia, zometa yearly started 07/27/2014  (9) tamoxifen started 07/27/2014  PLAN: Emmilyn is doing well as far as her breast cancer is concerned. We reviewed the results of her PET scan with showed the tamoxifen is keeping her residual disease under control. She is tolerating it much better than the anastrozole, and will continue it long term until we have evidence of recurrent disease.   I have encouraged her to wear her compression sleeve more consistently for better results.  Lidiya will return in December for labs and a follow up visit with Dr. Jana Hakim. She understands and agrees with this plan. She knows the goal of treatment in her case is control. She has been encouraged to call with any issues that might arise before her next visit here. Laurie Panda, NP   11/01/2014 12:13 PM

## 2015-01-18 ENCOUNTER — Ambulatory Visit: Payer: Commercial Managed Care - HMO | Admitting: Podiatry

## 2015-03-20 ENCOUNTER — Ambulatory Visit (HOSPITAL_BASED_OUTPATIENT_CLINIC_OR_DEPARTMENT_OTHER): Payer: Commercial Managed Care - HMO | Admitting: Oncology

## 2015-03-20 ENCOUNTER — Other Ambulatory Visit (HOSPITAL_BASED_OUTPATIENT_CLINIC_OR_DEPARTMENT_OTHER): Payer: Commercial Managed Care - HMO

## 2015-03-20 ENCOUNTER — Telehealth: Payer: Self-pay | Admitting: Oncology

## 2015-03-20 VITALS — BP 153/59 | HR 80 | Temp 98.4°F | Resp 18 | Ht 66.0 in | Wt 223.6 lb

## 2015-03-20 DIAGNOSIS — C50912 Malignant neoplasm of unspecified site of left female breast: Secondary | ICD-10-CM | POA: Diagnosis not present

## 2015-03-20 DIAGNOSIS — Z170 Estrogen receptor positive status [ER+]: Secondary | ICD-10-CM | POA: Diagnosis not present

## 2015-03-20 DIAGNOSIS — C50919 Malignant neoplasm of unspecified site of unspecified female breast: Secondary | ICD-10-CM

## 2015-03-20 DIAGNOSIS — C50312 Malignant neoplasm of lower-inner quadrant of left female breast: Secondary | ICD-10-CM

## 2015-03-20 DIAGNOSIS — Z79810 Long term (current) use of selective estrogen receptor modulators (SERMs): Secondary | ICD-10-CM

## 2015-03-20 DIAGNOSIS — C7989 Secondary malignant neoplasm of other specified sites: Secondary | ICD-10-CM

## 2015-03-20 DIAGNOSIS — Z853 Personal history of malignant neoplasm of breast: Secondary | ICD-10-CM

## 2015-03-20 DIAGNOSIS — M8580 Other specified disorders of bone density and structure, unspecified site: Secondary | ICD-10-CM

## 2015-03-20 DIAGNOSIS — I890 Lymphedema, not elsewhere classified: Secondary | ICD-10-CM

## 2015-03-20 LAB — COMPREHENSIVE METABOLIC PANEL
ALT: 9 U/L (ref 0–55)
AST: 14 U/L (ref 5–34)
Albumin: 3.7 g/dL (ref 3.5–5.0)
Alkaline Phosphatase: 58 U/L (ref 40–150)
Anion Gap: 11 mEq/L (ref 3–11)
BUN: 16.3 mg/dL (ref 7.0–26.0)
CO2: 22 meq/L (ref 22–29)
CREATININE: 0.8 mg/dL (ref 0.6–1.1)
Calcium: 9.3 mg/dL (ref 8.4–10.4)
Chloride: 106 mEq/L (ref 98–109)
EGFR: 86 mL/min/{1.73_m2} — ABNORMAL LOW (ref >90)
GLUCOSE: 129 mg/dL (ref 70–140)
Potassium: 4 mEq/L (ref 3.5–5.1)
SODIUM: 140 meq/L (ref 136–145)
Total Bilirubin: 0.49 mg/dL (ref 0.20–1.20)
Total Protein: 7.4 g/dL (ref 6.4–8.3)

## 2015-03-20 LAB — CBC WITH DIFFERENTIAL/PLATELET
BASO%: 1.1 % (ref 0.0–2.0)
Basophils Absolute: 0.1 10*3/uL (ref 0.0–0.1)
EOS%: 3.7 % (ref 0.0–7.0)
Eosinophils Absolute: 0.2 10*3/uL (ref 0.0–0.5)
HEMATOCRIT: 39.8 % (ref 34.8–46.6)
HGB: 12.8 g/dL (ref 11.6–15.9)
LYMPH%: 30.9 % (ref 14.0–49.7)
MCH: 25.2 pg (ref 25.1–34.0)
MCHC: 32.1 g/dL (ref 31.5–36.0)
MCV: 78.5 fL — ABNORMAL LOW (ref 79.5–101.0)
MONO#: 0.4 10*3/uL (ref 0.1–0.9)
MONO%: 6.7 % (ref 0.0–14.0)
NEUT#: 3.6 10*3/uL (ref 1.5–6.5)
NEUT%: 57.6 % (ref 38.4–76.8)
Platelets: 212 10*3/uL (ref 145–400)
RBC: 5.07 10*6/uL (ref 3.70–5.45)
RDW: 15.6 % — ABNORMAL HIGH (ref 11.2–14.5)
WBC: 6.3 10*3/uL (ref 3.9–10.3)
lymph#: 1.9 10*3/uL (ref 0.9–3.3)

## 2015-03-20 NOTE — Progress Notes (Signed)
ID: Tami Schmidt OB: 1933/02/11  MR#: [id number]  HQI#:[id number]  PCP: Lynne Logan, MD GYN:   SUCoralie Keens, MD OTHER MD: Kyung Rudd, MD  CHIEF COMPLAINT:  1)  Hx Right Breast Cancer    2)  Hx Left Breast Cancer, with recurrence and metastatic adenopathy  CURRENT TREATMENT: Tamoxifen, Denosumab/prolia  BREAST CANCER HISTORY: From the earlier summary:  Tami Schmidt's history of breast cancer dates back to 1993, when she underwent biopsy of her right breast in [place]. I do not have those details, but she moved to [place] shortly thereafter and it was in [place] where she had her right lumpectomy and axillary lymph node dissection. She had a total of 6 lymph nodes removed, she tells me, and all of them were clear. She does not recall the size of the primary. She was treated with adjuvant radiation and then received tamoxifen between 1993 and 1998.  In 2002 she had a local recurrence in the right breast, and underwent right mastectomy. She received tamoxifen between 2002 and 2007.  In 2004 she underwent a left mastectomy and sentinel lymph node sampling for a 9 mm mucinous carcinoma, grade 2, multifocal, associated with ductal carcinoma in situ. The single sentinel lymph node was negative. Margins were ample. She did not receive adjuvant radiation, but continued on tamoxifen as noted above, until 2007.  More recently, in July of 2014, the patient noted a "pimple" in the left chest wall, superior to her prior mastectomy incision. She brought this to the attention of her surgeon, Dr. Ninfa Linden and he describes a dimple area with a firm mass underneath. He also describes a large lipoma in the left clavicular area and a second one on her abdominal wall. Both of these were symptomatic. All 3 masses were removed 01/26/2013. The pathology from this procedure ([facility] [id number]) showed, in the chest wall, and invasive ductal carcinoma measuring grossly 1.3 cm. The lesion abutted the closest inked resection  margin. The other 2 masses proved to be lipomas.  The patient's subsequent history is as detailed below  INTERVAL HISTORY: Tami Schmidt returns to clinic today for follow up of her estrogen receptor positive breast cancer. She is tolerating the tamoxifen well, with no significant hot flashes, night sweats, or vaginal wetness. She obtain set for $11/three-month supply.  REVIEW OF SYSTEMS: Tami Schmidt gets a little bit short of breath when walking up stairs, but otherwise does all her usual activities which include cleaning her house, playing bridge, and singing in a ladies choir. She has low back pain and some joint pains here in there which are not more intense or persistent than before. A detailed review of systems today was otherwise stable.  PAST MEDICAL HISTORY: Past Medical History  Diagnosis Date  . Hypertension   . Cancer     breast  . Arthritis   . Breast cancer     b/l mastectomies hx  . Diabetes mellitus     fasting 90-100  . Hypercholesterolemia   . Hx of radiation therapy     breasts hx  . Type II or unspecified type diabetes mellitus without mention of complication, not stated as uncontrolled 03/25/2013  . HTN (hypertension) 03/25/2013  . 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 Lymphedema of arm     left arm    PAST SURGICAL HISTORY: Past Surgical History  Procedure Laterality Date  . Abdominal hysterectomy    . Hernia repair  04-26-2010  . Breast surgery Bilateral     mastectomy  .  Appendectomy    . Ankle surgery Right 1995  . Mass excision Left 01/26/2013    Procedure: EXCISION LEFT CHEST WALL MASS AND LEFT ABDOMNAL WALL MASS;  Surgeon: Harl Bowie, MD;  Location: [facility];  Service: General;  Laterality: Left;  Marland Kitchen Mass excision Left 09/20/2014    Procedure: EXCISION OF LEFT CHEST WALL MASS;  Surgeon: Coralie Keens, MD;  Location: [facility];  Service: General;  Laterality: Left;    FAMILY HISTORY The patient's father died from a heart  attack at the age of 72. The patient's mother died at the age of 59 from myocarditis. The patient had no brothers, one sister. There is no history of breast or ovarian cancer in the family to her knowledge  GYNECOLOGIC HISTORY:   (Reviewed 09/22/2013) Menarche age 73, first live birth age 84, the patient is Tami Schmidt P5. She underwent hysterectomy in 1984. She did not use hormone replacement.  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 Their children are: Reed Breech who lives in [place] and teaches theater there; Julius Bowels who works as a Social worker in [place]; Archivist who lives in [place] and is a "radio personality"; Elta Guadeloupe, who is a Biomedical scientist in [place], and Legrand Como who is a musician living in [place]. The patient has 2 grandchildren. She attends a [employer]    ADVANCED DIRECTIVES: Not in place   HEALTH MAINTENANCE:  (Updated 09/22/2013) Social History  Substance Use Topics  . Smoking status: Former Smoker    Types: Cigarettes    Quit date: 04/14/1972  . Smokeless tobacco: Never Used  . Alcohol Use: Yes     Comment: occasional    Colonoscopy: 2011  PAP: Status post hysterectomy  Bone density: 1993 - to be repeated in 2015  Lipid panel: not on file  Allergies  Allergen Reactions  . Bactrim Other (See Comments)    Swelling to the mouth and face.  . Lisinopril Other (See Comments)    Swelling to the mouth and face.  Michail Sermon Other (See Comments)    Swelling to the mouth and face.    Current Outpatient Prescriptions  Medication Sig Dispense Refill  . amLODipine (NORVASC) 10 MG tablet Take 10 mg by mouth daily.    Marland Kitchen aspirin EC 81 MG tablet Take 1 tablet (81 mg total) by mouth daily.    Marland Kitchen ezetimibe (ZETIA) 10 MG tablet Take 10 mg by mouth daily.    . fish oil-omega-3 fatty acids 1000 MG capsule Take 2 g by mouth daily.    . Insulin Isophane Human (HUMULIN N [place])  Inject 20-40 Units into the skin every morning. 40 units in the am    . losartan-hydrochlorothiazide (HYZAAR) 100-25 MG per tablet Take 1 tablet by mouth daily.     . metFORMIN (GLUCOPHAGE) 500 MG tablet Take 500 mg by mouth daily with breakfast.    . tamoxifen (NOLVADEX) 20 MG tablet Take 1 tablet (20 mg total) by mouth daily. 90 tablet 4   No current facility-administered medications for this visit.    OBJECTIVE: Older African American woman in no acute distress Filed Vitals:   03/20/15 1032  BP: 153/59  Pulse: 80  Temp: 98.4 F (36.9 C)  Resp: 18     Body mass index is 36.11 kg/(m^2).    ECOG FS: 2 Filed Weights   03/20/15 1032  Weight: 223 lb 9.6  oz (101.424 kg)   Sclerae unicteric, EOMs intact Oropharynx clear, no thrush or other lesions No cervical or supraclavicular adenopathy Lungs no rales or rhonchi Heart regular rate and rhythm Abd soft, obese, nontender, positive bowel sounds MSK no focal spinal tenderness, grade 1 left upper extremity lymphedema Neuro: nonfocal, well oriented, appropriate affect Breasts: Status post bilateral mastectomies. There is no evidence of chest wall recurrence. Both axillae are benign.  LAB RESULTS:  Lab Results  Component Value Date   WBC 6.3 03/20/2015   NEUTROABS 3.6 03/20/2015   HGB 12.8 03/20/2015   HCT 39.8 03/20/2015   MCV 78.5* 03/20/2015   PLT 212 03/20/2015      Chemistry      Component Value Date/Time   NA 140 03/20/2015 1003   NA 140 09/18/2014 1440   K 4.0 03/20/2015 1003   K 3.8 09/18/2014 1440   CL 106 09/18/2014 1440   CO2 22 03/20/2015 1003   CO2 25 09/18/2014 1440   BUN 16.3 03/20/2015 1003   BUN 16 09/18/2014 1440   CREATININE 0.8 03/20/2015 1003   CREATININE 0.80 09/18/2014 1440   CREATININE 0.74 06/14/2012 1257      Component Value Date/Time   CALCIUM 9.3 03/20/2015 1003   CALCIUM 9.3 09/18/2014 1440   ALKPHOS 58 03/20/2015 1003   ALKPHOS 86 04/20/2010 0942   AST 14 03/20/2015 1003   AST 21  04/20/2010 0942   ALT 9 03/20/2015 1003   ALT 18 04/20/2010 0942   BILITOT 0.49 03/20/2015 1003   BILITOT 0.5 04/20/2010 0942      STUDIES: No results found.   ASSESSMENT: 81 y.o. Tami Schmidt woman  (1) status post right lumpectomy and axillary dissection in 1993 in [place] for what appears to have been a stage I breast cancer, treated with radiation and then tamoxifen for 5 years  (2) status post right mastectomy 2002 for a recurrence in the right breast, treated adjuvantly with tamoxifen for an additional 5 years  (3) status post left mastectomy and sentinel lymph node sampling 03/07/2003 for a pT1b, pN0, stage IA mucinous breast cancer, grade 2; no radiation therapy was given. Tamoxifen was continued until 2009  (4) excisional biopsy of a left chest wall mass 01/26/2013 showing carcinoma consistent with invasive ductal carcinoma, estrogen receptor 100% positive, progesterone receptor 0% positive, with an MIB-1 of 22% and no HER-2 amplification.   (5)  PET scan on 03/03/2013 confirmed extensive metastatic adenopathy in the left axilla, left subpectoral musculature, and left supraclavicular region  (6)  started on anastrozole daily beginning 03/01/2013, interrupted during radiation therapy, resumed March 2015, discontinued March 2016 because of fatigue and arthralgias  (7) radiation therapy completed 05/23/2013  (8) Osteopenia, zometa yearly started 07/27/2014, but poorly tolerated.   (a) bone density scan on 12/27/13 showed a t-score of -1.8   (b) To start Denosumab/prolia April 2017  (9) tamoxifen started 07/27/2014  PLAN: Tami Schmidt is doing remarkably well as far as her breast cancer is concerned. Recall that PET scan July 2016 showed no to minimal evidence of disease activity. She is tolerating the tamoxifen with no side effects that she is aware of.  She is going to see [place] again in April. We will do a CT scan of the chest for restaging prior to that visit.  She did not  tolerate the zolendronate well. I hope she will do better with Denosumab. I have set her up to receive that the same day as her next visit. She will be encouraged  to take Tums 4 times a day that day and the next. That usually helps with symptoms of hypocalcemia.  Otherwise the overall plan is to continue tamoxifen indefinitely. She knows to call for any problems that may develop before her next visit here. Chauncey Cruel, MD   03/20/2015 11:24 AM

## 2015-03-20 NOTE — Telephone Encounter (Signed)
Appointments made and avs printed for patient °

## 2015-06-18 ENCOUNTER — Other Ambulatory Visit: Payer: Self-pay

## 2015-06-18 DIAGNOSIS — C50312 Malignant neoplasm of lower-inner quadrant of left female breast: Secondary | ICD-10-CM

## 2015-06-18 MED ORDER — TAMOXIFEN CITRATE 20 MG PO TABS: 20.0000 mg | ORAL_TABLET | Freq: Every day | ORAL | Status: DC

## 2015-06-18 NOTE — Telephone Encounter (Signed)
Patient calling requesting a refill on her tamoxifen.  [facility]  Medication sent.

## 2015-07-17 ENCOUNTER — Other Ambulatory Visit: Payer: Commercial Managed Care - HMO

## 2015-07-17 ENCOUNTER — Encounter (HOSPITAL_COMMUNITY): Payer: Self-pay

## 2015-07-17 ENCOUNTER — Other Ambulatory Visit (HOSPITAL_BASED_OUTPATIENT_CLINIC_OR_DEPARTMENT_OTHER): Payer: Medicare Other

## 2015-07-17 ENCOUNTER — Ambulatory Visit (HOSPITAL_COMMUNITY)
Admission: RE | Admit: 2015-07-17 | Discharge: 2015-07-17 | Disposition: A | Discharge status: Home / Self Care | Payer: Medicare Other | Source: Ambulatory Visit | Attending: Oncology | Admitting: Oncology

## 2015-07-17 DIAGNOSIS — C50919 Malignant neoplasm of unspecified site of unspecified female breast: Secondary | ICD-10-CM

## 2015-07-17 DIAGNOSIS — C7989 Secondary malignant neoplasm of other specified sites: Secondary | ICD-10-CM

## 2015-07-17 DIAGNOSIS — I2510 Atherosclerotic heart disease of native coronary artery without angina pectoris: Secondary | ICD-10-CM | POA: Diagnosis not present

## 2015-07-17 DIAGNOSIS — I289 Disease of pulmonary vessels, unspecified: Secondary | ICD-10-CM | POA: Insufficient documentation

## 2015-07-17 DIAGNOSIS — C50912 Malignant neoplasm of unspecified site of left female breast: Secondary | ICD-10-CM | POA: Diagnosis not present

## 2015-07-17 DIAGNOSIS — C50312 Malignant neoplasm of lower-inner quadrant of left female breast: Secondary | ICD-10-CM | POA: Diagnosis present

## 2015-07-17 DIAGNOSIS — I890 Lymphedema, not elsewhere classified: Secondary | ICD-10-CM

## 2015-07-17 LAB — CBC WITH DIFFERENTIAL/PLATELET
BASO%: 0.3 % (ref 0.0–2.0)
Basophils Absolute: 0 10*3/uL (ref 0.0–0.1)
EOS%: 3 % (ref 0.0–7.0)
Eosinophils Absolute: 0.2 10*3/uL (ref 0.0–0.5)
HEMATOCRIT: 39.3 % (ref 34.8–46.6)
HGB: 12.7 g/dL (ref 11.6–15.9)
LYMPH%: 31.8 % (ref 14.0–49.7)
MCH: 25.2 pg (ref 25.1–34.0)
MCHC: 32.3 g/dL (ref 31.5–36.0)
MCV: 78.1 fL — AB (ref 79.5–101.0)
MONO#: 0.3 10*3/uL (ref 0.1–0.9)
MONO%: 4.9 % (ref 0.0–14.0)
NEUT#: 3.6 10*3/uL (ref 1.5–6.5)
NEUT%: 60 % (ref 38.4–76.8)
PLATELETS: 218 10*3/uL (ref 145–400)
RBC: 5.03 10*6/uL (ref 3.70–5.45)
RDW: 15.7 % — ABNORMAL HIGH (ref 11.2–14.5)
WBC: 6.1 10*3/uL (ref 3.9–10.3)
lymph#: 1.9 10*3/uL (ref 0.9–3.3)

## 2015-07-17 LAB — COMPREHENSIVE METABOLIC PANEL
ALT: 9 U/L (ref 0–55)
AST: 13 U/L (ref 5–34)
Albumin: 3.7 g/dL (ref 3.5–5.0)
Alkaline Phosphatase: 52 U/L (ref 40–150)
Anion Gap: 8 mEq/L (ref 3–11)
BUN: 14.5 mg/dL (ref 7.0–26.0)
CALCIUM: 9.2 mg/dL (ref 8.4–10.4)
CHLORIDE: 106 meq/L (ref 98–109)
CO2: 27 mEq/L (ref 22–29)
CREATININE: 0.7 mg/dL (ref 0.6–1.1)
EGFR: 88 mL/min/{1.73_m2} — ABNORMAL LOW (ref >90)
GLUCOSE: 101 mg/dL (ref 70–140)
Potassium: 3.6 mEq/L (ref 3.5–5.1)
Sodium: 142 mEq/L (ref 136–145)
Total Bilirubin: 0.48 mg/dL (ref 0.20–1.20)
Total Protein: 7.3 g/dL (ref 6.4–8.3)

## 2015-07-17 MED ORDER — IOPAMIDOL (ISOVUE-300) INJECTION 61%
75.0000 mL | Freq: Once | INTRAVENOUS | Status: AC | PRN
  Administered 2015-07-17: 75 mL via INTRAVENOUS

## 2015-07-19 ENCOUNTER — Encounter: Payer: Self-pay | Admitting: Nurse Practitioner

## 2015-07-19 ENCOUNTER — Telehealth: Payer: Self-pay | Admitting: Nurse Practitioner

## 2015-07-19 ENCOUNTER — Ambulatory Visit (HOSPITAL_BASED_OUTPATIENT_CLINIC_OR_DEPARTMENT_OTHER): Payer: Medicare Other | Admitting: Nurse Practitioner

## 2015-07-19 ENCOUNTER — Ambulatory Visit (HOSPITAL_BASED_OUTPATIENT_CLINIC_OR_DEPARTMENT_OTHER): Payer: Medicare Other

## 2015-07-19 VITALS — BP 153/53 | HR 88 | Temp 98.0°F | Resp 20 | Ht 66.0 in | Wt 221.4 lb

## 2015-07-19 DIAGNOSIS — M8580 Other specified disorders of bone density and structure, unspecified site: Secondary | ICD-10-CM

## 2015-07-19 DIAGNOSIS — Z79810 Long term (current) use of selective estrogen receptor modulators (SERMs): Secondary | ICD-10-CM | POA: Diagnosis not present

## 2015-07-19 DIAGNOSIS — C50912 Malignant neoplasm of unspecified site of left female breast: Secondary | ICD-10-CM | POA: Diagnosis not present

## 2015-07-19 DIAGNOSIS — C7989 Secondary malignant neoplasm of other specified sites: Secondary | ICD-10-CM

## 2015-07-19 DIAGNOSIS — C50312 Malignant neoplasm of lower-inner quadrant of left female breast: Secondary | ICD-10-CM

## 2015-07-19 DIAGNOSIS — Z170 Estrogen receptor positive status [ER+]: Secondary | ICD-10-CM | POA: Diagnosis not present

## 2015-07-19 DIAGNOSIS — Z853 Personal history of malignant neoplasm of breast: Secondary | ICD-10-CM

## 2015-07-19 DIAGNOSIS — R5382 Chronic fatigue, unspecified: Secondary | ICD-10-CM

## 2015-07-19 MED ORDER — DENOSUMAB 60 MG/ML ~~LOC~~ SOLN
60.0000 mg | Freq: Once | SUBCUTANEOUS | Status: AC
  Administered 2015-07-19: 60 mg via SUBCUTANEOUS
  Filled 2015-07-19: qty 1

## 2015-07-19 NOTE — Telephone Encounter (Signed)
appt made and avs printed °

## 2015-07-19 NOTE — Progress Notes (Signed)
ID: Tami Schmidt OB: 02/23/1933  MR#: [id number]  CSN#:[id number]  PCP: Lynne Logan, MD GYN:   SUCoralie Keens, MD OTHER MD: Kyung Rudd, MD  CHIEF COMPLAINT:  1)  Hx Right Breast Cancer    2)  Hx Left Breast Cancer, with recurrence and metastatic adenopathy  CURRENT TREATMENT: Tamoxifen, Denosumab/prolia  BREAST CANCER HISTORY: From the earlier summary:  Tami Schmidt's history of breast cancer dates back to 1993, when she underwent biopsy of her right breast in [place]. I do not have those details, but she moved to [place] shortly thereafter and it was in [place] where she had her right lumpectomy and axillary lymph node dissection. She had a total of 6 lymph nodes removed, she tells me, and all of them were clear. She does not recall the size of the primary. She was treated with adjuvant radiation and then received tamoxifen between 1993 and 1998.  In 2002 she had a local recurrence in the right breast, and underwent right mastectomy. She received tamoxifen between 2002 and 2007.  In 2004 she underwent a left mastectomy and sentinel lymph node sampling for a 9 mm mucinous carcinoma, grade 2, multifocal, associated with ductal carcinoma in situ. The single sentinel lymph node was negative. Margins were ample. She did not receive adjuvant radiation, but continued on tamoxifen as noted above, until 2007.  More recently, in July of 2014, the patient noted a "pimple" in the left chest wall, superior to her prior mastectomy incision. She brought this to the attention of her surgeon, Dr. Ninfa Linden and he describes a dimple area with a firm mass underneath. He also describes a large lipoma in the left clavicular area and a second one on her abdominal wall. Both of these were symptomatic. All 3 masses were removed 01/26/2013. The pathology from this procedure (SZA [id number]) showed, in the chest wall, and invasive ductal carcinoma measuring grossly 1.3 cm. The lesion abutted the closest inked resection  margin. The other 2 masses proved to be lipomas.  The patient's subsequent history is as detailed below  INTERVAL HISTORY: Tami Schmidt returns to clinic today for follow up of her estrogen receptor positive breast cancer. She has been on tamoxifen for a full year now. She denies hot flashes or vaginal changes. The interval history is generally unremarkable. She is due to receive her first prolia injection today, which she will receive every 6 months. She did not tolerate zometa well previously.  REVIEW OF SYSTEMS: Tami Schmidt's "one and only complaint" today is fatigue. She does not exercise regularly. She is short of breath walking up steps or an incline. She is able to do her chores and run errands with no issues. She has generalized joint and low back pain, but this is unchanged. She has diabetic neuropathy  to her right foot. Her blood sugars are well controlled, however. A detailed review of systems is otherwise entirely negative.   PAST MEDICAL HISTORY: Past Medical History  Diagnosis Date  . Hypertension   . Cancer (HCC)     breast  . Arthritis   . Breast cancer ([facility])     b/l mastectomies hx  . Diabetes mellitus     fasting 90-100  . Hypercholesterolemia   . Hx of radiation therapy     breasts hx  . Type II or unspecified type diabetes mellitus without mention of complication, not stated as uncontrolled 03/25/2013  . HTN (hypertension) 03/25/2013  . 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) 03/25/2013  . Lymphedema of arm  left arm    PAST SURGICAL HISTORY: Past Surgical History  Procedure Laterality Date  . Abdominal hysterectomy    . Hernia repair  04-26-2010  . Breast surgery Bilateral     mastectomy  . Appendectomy    . Ankle surgery Right 1995  . Mass excision Left 01/26/2013    Procedure: EXCISION LEFT CHEST WALL MASS AND LEFT ABDOMNAL WALL MASS;  Surgeon: Harl Bowie, MD;  Location: [facility];  Service: General;  Laterality: Left;  Marland Kitchen Mass excision Left 09/20/2014     Procedure: EXCISION OF LEFT CHEST WALL MASS;  Surgeon: Coralie Keens, MD;  Location: [facility];  Service: General;  Laterality: Left;    FAMILY HISTORY The patient's father died from a heart attack at the age of 34. The patient's mother died at the age of 85 from myocarditis. The patient had no brothers, one sister. There is no history of breast or ovarian cancer in the family to her knowledge  GYNECOLOGIC HISTORY:   (Reviewed 09/22/2013) Menarche age 65, first live birth age 27, the patient is [place] P5. She underwent hysterectomy in 1984. She did not use hormone replacement.  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 Their children are: Reed Breech who lives in [place] and teaches theater there; Julius Bowels who works as a Social worker in [place]; Archivist who lives in [place] and is a "radio personality"; Elta Guadeloupe, who is a Biomedical scientist in [place], and Legrand Como who is a musician living in [place]. The patient has 2 grandchildren. She attends a [employer]    ADVANCED DIRECTIVES: Not in place   HEALTH MAINTENANCE:  (Updated 09/22/2013) Social History  Substance Use Topics  . Smoking status: Former Smoker    Types: Cigarettes    Quit date: 04/14/1972  . Smokeless tobacco: Never Used  . Alcohol Use: Yes     Comment: occasional    Colonoscopy: 2011  PAP: Status post hysterectomy  Bone density: 1993 - to be repeated in 2015  Lipid panel: not on file  Allergies  Allergen Reactions  . Bactrim Other (See Comments)    Swelling to the mouth and face.  . Lisinopril Other (See Comments)    Swelling to the mouth and face.  Michail Sermon Other (See Comments)    Swelling to the mouth and face.    Current Outpatient Prescriptions  Medication Sig Dispense Refill  . amLODipine (NORVASC) 10 MG tablet Take 10 mg by mouth daily.    Marland Kitchen aspirin EC 81 MG tablet Take 1 tablet (81  mg total) by mouth daily.    Marland Kitchen ezetimibe (ZETIA) 10 MG tablet Take 10 mg by mouth daily.    . fish oil-omega-3 fatty acids 1000 MG capsule Take 2 g by mouth daily.    . Insulin Isophane Human (HUMULIN N [place]) Inject 20-40 Units into the skin every morning. 40 units in the am, 20 units in the evening    . losartan-hydrochlorothiazide (HYZAAR) 100-25 MG per tablet Take 1 tablet by mouth daily.     . metFORMIN (GLUCOPHAGE) 500 MG tablet Take 500 mg by mouth daily with breakfast.    . tamoxifen (NOLVADEX) 20 MG tablet Take 1 tablet (20 mg total) by mouth daily. 90 tablet 4   No current facility-administered medications for this visit.    OBJECTIVE: Older African American woman in no acute distress Filed Vitals:  07/19/15 1054  BP: 153/53  Pulse: 88  Temp: 98 F (36.7 C)  Resp: 20     Body mass index is 35.75 kg/(m^2).    ECOG FS: 2 Filed Weights   07/19/15 1054  Weight: 221 lb 6.4 oz (100.426 kg)    Skin: warm, dry  HEENT: sclerae anicteric, conjunctivae pink, oropharynx clear. No thrush or mucositis.  Lymph Nodes: No cervical or supraclavicular lymphadenopathy  Lungs: clear to auscultation bilaterally, no rales, wheezes, or rhonci  Heart: regular rate and rhythm  Abdomen: round, soft, non tender, positive bowel sounds  Musculoskeletal: No focal spinal tenderness, no peripheral edema  Neuro: non focal, well oriented, positive affect  Breasts: bilateral breast status post mastectomies. No evidence of recurrent disease. Bilateral axillae benign.   LAB RESULTS:  Lab Results  Component Value Date   WBC 6.1 07/17/2015   NEUTROABS 3.6 07/17/2015   HGB 12.7 07/17/2015   HCT 39.3 07/17/2015   MCV 78.1* 07/17/2015   PLT 218 07/17/2015      Chemistry      Component Value Date/Time   NA 142 07/17/2015 1112   NA 140 09/18/2014 1440   K 3.6 07/17/2015 1112   K 3.8 09/18/2014 1440   CL 106 09/18/2014 1440   CO2 27 07/17/2015 1112   CO2 25 09/18/2014 1440   BUN 14.5 07/17/2015  1112   BUN 16 09/18/2014 1440   CREATININE 0.7 07/17/2015 1112   CREATININE 0.80 09/18/2014 1440   CREATININE 0.74 06/14/2012 1257      Component Value Date/Time   CALCIUM 9.2 07/17/2015 1112   CALCIUM 9.3 09/18/2014 1440   ALKPHOS 52 07/17/2015 1112   ALKPHOS 86 04/20/2010 0942   AST 13 07/17/2015 1112   AST 21 04/20/2010 0942   ALT 9 07/17/2015 1112   ALT 18 04/20/2010 0942   BILITOT 0.48 07/17/2015 1112   BILITOT 0.5 04/20/2010 0942      STUDIES: Ct Chest W Contrast  07/17/2015  CLINICAL DATA:  Restaging left breast cancer, left arm edema, chemotherapy complete. EXAM: CT CHEST WITH CONTRAST TECHNIQUE: Multidetector CT imaging of the chest was performed during intravenous contrast administration. CONTRAST:  28m ISOVUE-300 IOPAMIDOL (ISOVUE-300) INJECTION 61% COMPARISON:  PET 10/17/2014. FINDINGS: Mediastinum/Nodes: Low-attenuation lesions in the thyroid measure up to 2.2 cm, similar. No pathologically enlarged mediastinal, hilar or right axillary lymph nodes. Surgical clips are seen in the right axilla. Hazy lymph nodes in the left axilla measure up to 11 mm, stable. Pulmonary arteries are enlarged. Heart is at the upper limits of normal in size to mildly enlarged. Coronary artery calcification. No pericardial effusion. Lungs/Pleura: Mild subpleural radiation fibrosis in the left upper lobe. Additional scattered pulmonary parenchymal scarring. No pleural fluid. Airway is unremarkable. Upper abdomen: Visualized portions of the liver, gallbladder, adrenal glands, kidneys, spleen and pancreas are grossly unremarkable. Small hiatal hernia. Musculoskeletal: No worrisome lytic or sclerotic lesions. Flowing anterior osteophytosis in the thoracic spine. IMPRESSION: 1. No evidence of metastatic disease. 2. Enlarged pulmonary arteries, indicative of pulmonary arterial hypertension. 3. Coronary artery calcification. Electronically Signed   By: MLorin PicketM.D.   On: 07/17/2015 14:16      ASSESSMENT: 82y.o. Tami Schmidt woman  (1) [place] to [place], treated with radiation and then tamoxifen for 5 years  (2) status post right mastectomy 2002 for a recurrence in the right breast, treated adjuvantly with tamoxifen for  an additional 5 years  (3) status post left mastectomy and sentinel lymph node sampling 03/07/2003 for a pT1b, pN0, stage IA mucinous breast cancer, grade 2; no radiation therapy was given. Tamoxifen was continued until 2009  (4) excisional biopsy of a left chest wall mass 01/26/2013 showing carcinoma consistent with invasive ductal carcinoma, estrogen receptor 100% positive, progesterone receptor 0% positive, with an MIB-1 of 22% and no HER-2 amplification.   (5)  PET scan on 03/03/2013 confirmed extensive metastatic adenopathy in the left axilla, left subpectoral musculature, and left supraclavicular region  (6)  started on anastrozole daily beginning 03/01/2013, interrupted during radiation therapy, resumed March 2015, discontinued March 2016 because of fatigue and arthralgias  (7) radiation therapy completed 05/23/2013  (8) Osteopenia, zometa yearly started 07/27/2014, but poorly tolerated.   (a) bone density scan on 12/27/13 showed a t-score of -1.8   (b) To start Denosumab/prolia April 2017  (9) tamoxifen started 07/27/2014  PLAN: Tami Schmidt and I reviewed the results of her chest CT. Again there is no evidence of metastatic disease. She will continue tamoxifen daily and is tolerating this drug well. She will be on this indefinitely until there is evidence of progression. She will have her first prolia injection today.   We talked about exercise as a way to control her fatigue, but she was uninterested.   Tami Schmidt will continue visits every 3-4 months until Dr. Jana Hakim broadens the follow up interval. She understands and agrees with this plan. She  knows the goal of treatment in her case is control. She has been encouraged to call with any issues that might arise before her next visit here.    Laurie Panda, NP   07/19/2015 3:48 PM

## 2015-10-19 ENCOUNTER — Encounter: Payer: Self-pay | Admitting: *Deleted

## 2015-10-19 ENCOUNTER — Encounter: Payer: Medicare Other | Admitting: Nurse Practitioner

## 2015-10-19 NOTE — Progress Notes (Signed)
Appointment request entered for this patient by error per RN.

## 2015-11-15 ENCOUNTER — Other Ambulatory Visit: Payer: Self-pay

## 2015-11-15 DIAGNOSIS — C50312 Malignant neoplasm of lower-inner quadrant of left female breast: Secondary | ICD-10-CM

## 2015-11-18 NOTE — Progress Notes (Signed)
ID: Tami Schmidt OB: 06/06/1932  MR#: [id number]  CSN#:[id number]  PCP: Lynne Logan, MD GYN:   SUCoralie Keens, MD OTHER MD: Kyung Rudd, MD  CHIEF COMPLAINT:  1)  Hx Right Breast Cancer    2)  Hx Left Breast Cancer, with recurrence and metastatic adenopathy  CURRENT TREATMENT: Tamoxifen, Denosumab/prolia  BREAST CANCER HISTORY: From the earlier summary:  [name]'s history of breast cancer dates back to 1993, when she underwent biopsy of her right breast in [place]. I do not have those details, but she moved to [place] shortly thereafter and it was in [place] where she had her right lumpectomy and axillary lymph node dissection. She had a total of 6 lymph nodes removed, she tells me, and all of them were clear. She does not recall the size of the primary. She was treated with adjuvant radiation and then received tamoxifen between 1993 and 1998.  In 2002 she had a local recurrence in the right breast, and underwent right mastectomy. She received tamoxifen between 2002 and 2007.  In 2004 she underwent a left mastectomy and sentinel lymph node sampling for a 9 mm mucinous carcinoma, grade 2, multifocal, associated with ductal carcinoma in situ. The single sentinel lymph node was negative. Margins were ample. She did not receive adjuvant radiation, but continued on tamoxifen as noted above, until 2007.  More recently, in July of 2014, the patient noted a "pimple" in the left chest wall, superior to her prior mastectomy incision. She brought this to the attention of her surgeon, Dr. Ninfa Linden and he describes a dimple area with a firm mass underneath. He also describes a large lipoma in the left clavicular area and a second one on her abdominal wall. Both of these were symptomatic. All 3 masses were removed 01/26/2013. The pathology from this procedure ([id number]) showed, in the chest wall, and invasive ductal carcinoma measuring grossly 1.3 cm. The lesion abutted the closest inked resection  margin. The other 2 masses proved to be lipomas.  The patient's subsequent history is as detailed below  INTERVAL HISTORY: Tami Schmidt returns to clinic today for follow up of her breast cancer. She continues on tamoxifen, with excellent tolerance. Hot flashes, vaginal wetness, and other side effects are not a concern. She also presented at a very good price.  REVIEW OF SYSTEMS: Has some arthritis pains here and there which are not more intense or persistent than before. She tells me her blood sugar is well-controlled. A detailed review of systems today was otherwise noncontributory   PAST MEDICAL HISTORY: Past Medical History:  Diagnosis Date  . Arthritis   . Breast cancer ([place])    b/l mastectomies hx  . Cancer [facility])    breast  . 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 Diabetes mellitus    fasting 90-100  . HTN (hypertension) 03/25/2013  . Hx of radiation therapy    breasts hx  . Hypercholesterolemia   . Hypertension   . Lymphedema of arm    left arm  . Type II or unspecified type diabetes mellitus without mention of complication, not stated as uncontrolled 03/25/2013    PAST SURGICAL HISTORY: Past Surgical History:  Procedure Laterality Date  . ABDOMINAL HYSTERECTOMY    . ANKLE SURGERY Right 1995  . APPENDECTOMY    . BREAST SURGERY Bilateral    mastectomy  . HERNIA REPAIR  04-26-2010  . MASS EXCISION Left 01/26/2013   Procedure: EXCISION LEFT CHEST WALL MASS AND LEFT ABDOMNAL WALL MASS;  Surgeon: Harl Bowie, MD;  Location: [facility];  Service: General;  Laterality: Left;  Marland Kitchen MASS EXCISION Left 09/20/2014   Procedure: EXCISION OF LEFT CHEST WALL MASS;  Surgeon: Coralie Keens, MD;  Location: [facility];  Service: General;  Laterality: Left;    FAMILY HISTORY The patient's father died from a heart attack at the age of 80. The patient's mother died at the age of 80 from myocarditis. The patient had no brothers, one sister. There is no history  of breast or ovarian cancer in the family to her knowledge  GYNECOLOGIC HISTORY:   (Reviewed 09/22/2013) Menarche age 43, first live birth age 33, the patient is [place] P5. She underwent hysterectomy in 1984. She did not use hormone replacement.  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 Her husband Radiographer, therapeutic used to work in [employer] but is now retired. Their children are: Reed Breech who lives in [place] and teaches theater there; Julius Bowels who works as a Social worker in [place]; Archivist who lives in [place] and is a "radio personality"; Elta Guadeloupe, who is a Biomedical scientist in [place], and Legrand Como who is a musician living in [place]. The patient has 2 grandchildren. She attends a [employer]    ADVANCED DIRECTIVES: Not in place   HEALTH MAINTENANCE:  (Updated 09/22/2013) Social History  Substance Use Topics  . Smoking status: Former Smoker    Types: Cigarettes    Quit date: 04/14/1972  . Smokeless tobacco: Never Used  . Alcohol use Yes     Comment: occasional    Colonoscopy: 2011  PAP: Status post hysterectomy  Bone density: 1993 - to be repeated in 2015  Lipid panel: not on file  Allergies  Allergen Reactions  . Bactrim Other (See Comments)    Swelling to the mouth and face.  . Lisinopril Other (See Comments)    Swelling to the mouth and face.  Michail Sermon Other (See Comments)    Swelling to the mouth and face.    Current Outpatient Prescriptions  Medication Sig Dispense Refill  . amLODipine (NORVASC) 10 MG tablet Take 10 mg by mouth daily.    Marland Kitchen aspirin EC 81 MG tablet Take 1 tablet (81 mg total) by mouth daily.    . fish oil-omega-3 fatty acids 1000 MG capsule Take 2 g by mouth daily.    . Insulin Isophane Human (HUMULIN N [place]) Inject 20-40 Units into the skin every morning. 40 units in the am, 20 units in the evening    . losartan-hydrochlorothiazide (HYZAAR) 100-25 MG per tablet Take 1 tablet by mouth daily.     . metFORMIN (GLUCOPHAGE)  500 MG tablet Take 500 mg by mouth daily with breakfast.    . tamoxifen (NOLVADEX) 20 MG tablet Take 1 tablet (20 mg total) by mouth daily. 90 tablet 4   No current facility-administered medications for this visit.     OBJECTIVE: Older African American womanWho appears stated age 33:   11/19/15 1215  BP: (!) 159/60  Pulse: 81  Resp: 18  Temp: 98.6 F (37 C)     Body mass index is 35.78 kg/m.    ECOG FS: 1 Filed Weights   11/19/15 1215  Weight: 221 lb 11.2 oz (100.6 kg)    Sclerae unicteric, pupils round and equal Oropharynx clear and moist-- no thrush or other lesions No cervical or supraclavicular adenopathy Lungs no rales or rhonchi Heart regular rate and rhythm Abd soft, obese, nontender, positive  bowel sounds MSK no focal spinal tenderness, no upper extremity lymphedema Neuro: nonfocal, well oriented, appropriate affect Breasts: Status post bilateral mastectomies. No evidence of chest wall recurrence. Both axillae are benign.  LAB RESULTS:  Lab Results  Component Value Date   WBC 4.8 11/19/2015   NEUTROABS 2.5 11/19/2015   HGB 12.4 11/19/2015   HCT 38.0 11/19/2015   MCV 76.9 (L) 11/19/2015   PLT 197 11/19/2015      Chemistry      Component Value Date/Time   NA 139 11/19/2015 1119   K 4.1 11/19/2015 1119   CL 106 09/18/2014 1440   CO2 23 11/19/2015 1119   BUN 15.3 11/19/2015 1119   CREATININE 0.8 11/19/2015 1119      Component Value Date/Time   CALCIUM 9.1 11/19/2015 1119   ALKPHOS 55 11/19/2015 1119   AST 12 11/19/2015 1119   ALT 9 11/19/2015 1119   BILITOT 0.33 11/19/2015 1119      STUDIES: No results found.   ASSESSMENT: 82 y.o. [place] woman  (1) status post right lumpectomy and axillary dissection in 1993 in [place] for what appears to have been a stage I breast cancer, treated with radiation and then tamoxifen for 5 years  (2) status post right mastectomy 2002 for a recurrence in the right breast, treated adjuvantly with tamoxifen  for an additional 5 years  (3) status post left mastectomy and sentinel lymph node sampling 03/07/2003 for a pT1b, pN0, stage IA mucinous breast cancer, grade 2; no radiation therapy was given. Tamoxifen was continued until 2009  (4) excisional biopsy of a left chest wall mass 01/26/2013 showing carcinoma consistent with invasive ductal carcinoma, estrogen receptor 100% positive, progesterone receptor 0% positive, with an MIB-1 of 22% and no HER-2 amplification.   (5)  PET scan on 03/03/2013 confirmed extensive metastatic adenopathy in the left axilla, left subpectoral musculature, and left supraclavicular region  (6)  started on anastrozole daily beginning 03/01/2013, interrupted during radiation therapy, resumed March 2015, discontinued March 2016 because of fatigue and arthralgias  (7) radiation therapy completed 05/23/2013  (8) Osteopenia, zometa yearly started 07/27/2014, but poorly tolerated.   (a) bone density scan on 12/27/13 showed a t-score of -1.8   (b) To start Ddnosumab/Prolia April 2017  (9) tamoxifen started 07/27/2014  PLAN:   Vaccine is tolerating tamoxifen well and the plan will be to continue that for a total of 5 years, which will take [place] to the end of 2019.  I'm going to obtain a restaging CT scan of the chest this December, and from that point I will start seeing her on a once a year basis.  She knows to call for any problems that may develop before the next visit. Chauncey Cruel, MD   11/19/2015 7:05 PM

## 2015-11-19 ENCOUNTER — Other Ambulatory Visit (HOSPITAL_BASED_OUTPATIENT_CLINIC_OR_DEPARTMENT_OTHER): Payer: Medicare Other

## 2015-11-19 ENCOUNTER — Ambulatory Visit (HOSPITAL_BASED_OUTPATIENT_CLINIC_OR_DEPARTMENT_OTHER): Payer: Medicare Other | Admitting: Oncology

## 2015-11-19 ENCOUNTER — Telehealth: Payer: Self-pay | Admitting: Oncology

## 2015-11-19 VITALS — BP 159/60 | HR 81 | Temp 98.6°F | Resp 18 | Ht 66.0 in | Wt 221.7 lb

## 2015-11-19 DIAGNOSIS — C50912 Malignant neoplasm of unspecified site of left female breast: Secondary | ICD-10-CM

## 2015-11-19 DIAGNOSIS — C7989 Secondary malignant neoplasm of other specified sites: Secondary | ICD-10-CM

## 2015-11-19 DIAGNOSIS — C50911 Malignant neoplasm of unspecified site of right female breast: Secondary | ICD-10-CM

## 2015-11-19 DIAGNOSIS — C50312 Malignant neoplasm of lower-inner quadrant of left female breast: Secondary | ICD-10-CM

## 2015-11-19 DIAGNOSIS — Z170 Estrogen receptor positive status [ER+]: Secondary | ICD-10-CM

## 2015-11-19 DIAGNOSIS — M8580 Other specified disorders of bone density and structure, unspecified site: Secondary | ICD-10-CM

## 2015-11-19 DIAGNOSIS — Z79810 Long term (current) use of selective estrogen receptor modulators (SERMs): Secondary | ICD-10-CM | POA: Diagnosis not present

## 2015-11-19 LAB — CBC WITH DIFFERENTIAL/PLATELET
BASO%: 0.8 % (ref 0.0–2.0)
Basophils Absolute: 0 10*3/uL (ref 0.0–0.1)
EOS%: 3.6 % (ref 0.0–7.0)
Eosinophils Absolute: 0.2 10*3/uL (ref 0.0–0.5)
HCT: 38 % (ref 34.8–46.6)
HGB: 12.4 g/dL (ref 11.6–15.9)
LYMPH%: 37 % (ref 14.0–49.7)
MCH: 25.1 pg (ref 25.1–34.0)
MCHC: 32.6 g/dL (ref 31.5–36.0)
MCV: 76.9 fL — AB (ref 79.5–101.0)
MONO#: 0.3 10*3/uL (ref 0.1–0.9)
MONO%: 5.5 % (ref 0.0–14.0)
NEUT%: 53.1 % (ref 38.4–76.8)
NEUTROS ABS: 2.5 10*3/uL (ref 1.5–6.5)
NRBC: 0 % (ref 0–0)
Platelets: 197 10*3/uL (ref 145–400)
RBC: 4.94 10*6/uL (ref 3.70–5.45)
RDW: 15.7 % — ABNORMAL HIGH (ref 11.2–14.5)
WBC: 4.8 10*3/uL (ref 3.9–10.3)
lymph#: 1.8 10*3/uL (ref 0.9–3.3)

## 2015-11-19 LAB — COMPREHENSIVE METABOLIC PANEL
ALT: 9 U/L (ref 0–55)
AST: 12 U/L (ref 5–34)
Albumin: 3.6 g/dL (ref 3.5–5.0)
Alkaline Phosphatase: 55 U/L (ref 40–150)
Anion Gap: 12 mEq/L — ABNORMAL HIGH (ref 3–11)
BILIRUBIN TOTAL: 0.33 mg/dL (ref 0.20–1.20)
BUN: 15.3 mg/dL (ref 7.0–26.0)
CO2: 23 meq/L (ref 22–29)
Calcium: 9.1 mg/dL (ref 8.4–10.4)
Chloride: 104 mEq/L (ref 98–109)
Creatinine: 0.8 mg/dL (ref 0.6–1.1)
EGFR: 81 mL/min/{1.73_m2} — ABNORMAL LOW (ref >90)
GLUCOSE: 195 mg/dL — AB (ref 70–140)
Potassium: 4.1 mEq/L (ref 3.5–5.1)
SODIUM: 139 meq/L (ref 136–145)
TOTAL PROTEIN: 7.1 g/dL (ref 6.4–8.3)

## 2015-11-19 MED ORDER — TAMOXIFEN CITRATE 20 MG PO TABS: 20.0000 mg | ORAL_TABLET | Freq: Every day | ORAL | 4 refills | Status: DC

## 2015-11-19 NOTE — Telephone Encounter (Signed)
per pof to sch pt appt-cld & spoke to pt and gave pt time & date of appt °

## 2016-01-17 ENCOUNTER — Ambulatory Visit: Payer: Medicare Other

## 2016-01-18 ENCOUNTER — Ambulatory Visit: Payer: Medicare Other

## 2016-03-17 ENCOUNTER — Other Ambulatory Visit (HOSPITAL_BASED_OUTPATIENT_CLINIC_OR_DEPARTMENT_OTHER): Payer: Medicare Other

## 2016-03-17 DIAGNOSIS — C7989 Secondary malignant neoplasm of other specified sites: Secondary | ICD-10-CM

## 2016-03-17 DIAGNOSIS — C50912 Malignant neoplasm of unspecified site of left female breast: Secondary | ICD-10-CM

## 2016-03-17 DIAGNOSIS — C50312 Malignant neoplasm of lower-inner quadrant of left female breast: Secondary | ICD-10-CM

## 2016-03-17 DIAGNOSIS — C50911 Malignant neoplasm of unspecified site of right female breast: Secondary | ICD-10-CM | POA: Diagnosis not present

## 2016-03-17 LAB — COMPREHENSIVE METABOLIC PANEL
ALBUMIN: 3.6 g/dL (ref 3.5–5.0)
ALT: 16 U/L (ref 0–55)
AST: 18 U/L (ref 5–34)
Alkaline Phosphatase: 60 U/L (ref 40–150)
Anion Gap: 11 mEq/L (ref 3–11)
BILIRUBIN TOTAL: 0.58 mg/dL (ref 0.20–1.20)
BUN: 14.4 mg/dL (ref 7.0–26.0)
CO2: 25 mEq/L (ref 22–29)
Calcium: 9.8 mg/dL (ref 8.4–10.4)
Chloride: 104 mEq/L (ref 98–109)
Creatinine: 0.7 mg/dL (ref 0.6–1.1)
EGFR: >90 mL/min/{1.73_m2} (ref >90)
GLUCOSE: 78 mg/dL (ref 70–140)
Potassium: 3.8 mEq/L (ref 3.5–5.1)
Sodium: 141 mEq/L (ref 136–145)
TOTAL PROTEIN: 7.4 g/dL (ref 6.4–8.3)

## 2016-03-17 LAB — CBC WITH DIFFERENTIAL/PLATELET
BASO%: 0.9 % (ref 0.0–2.0)
Basophils Absolute: 0.1 10*3/uL (ref 0.0–0.1)
EOS ABS: 0.3 10*3/uL (ref 0.0–0.5)
EOS%: 4.5 % (ref 0.0–7.0)
HCT: 40.5 % (ref 34.8–46.6)
HEMOGLOBIN: 12.7 g/dL (ref 11.6–15.9)
LYMPH%: 30.2 % (ref 14.0–49.7)
MCH: 24.7 pg — ABNORMAL LOW (ref 25.1–34.0)
MCHC: 31.3 g/dL — ABNORMAL LOW (ref 31.5–36.0)
MCV: 78.8 fL — AB (ref 79.5–101.0)
MONO#: 0.4 10*3/uL (ref 0.1–0.9)
MONO%: 5.8 % (ref 0.0–14.0)
NEUT%: 58.6 % (ref 38.4–76.8)
NEUTROS ABS: 3.9 10*3/uL (ref 1.5–6.5)
Platelets: 213 10*3/uL (ref 145–400)
RBC: 5.14 10*6/uL (ref 3.70–5.45)
RDW: 15.8 % — AB (ref 11.2–14.5)
WBC: 6.6 10*3/uL (ref 3.9–10.3)
lymph#: 2 10*3/uL (ref 0.9–3.3)

## 2016-03-18 ENCOUNTER — Ambulatory Visit (HOSPITAL_COMMUNITY)
Admission: RE | Admit: 2016-03-18 | Discharge: 2016-03-18 | Disposition: A | Discharge status: Home / Self Care | Payer: Medicare Other | Source: Ambulatory Visit | Attending: Oncology | Admitting: Oncology

## 2016-03-18 ENCOUNTER — Encounter (HOSPITAL_COMMUNITY): Payer: Self-pay

## 2016-03-18 DIAGNOSIS — R918 Other nonspecific abnormal finding of lung field: Secondary | ICD-10-CM | POA: Insufficient documentation

## 2016-03-18 DIAGNOSIS — I700 Atherosclerosis of aorta: Secondary | ICD-10-CM | POA: Diagnosis not present

## 2016-03-18 DIAGNOSIS — C7989 Secondary malignant neoplasm of other specified sites: Secondary | ICD-10-CM | POA: Insufficient documentation

## 2016-03-18 DIAGNOSIS — C50312 Malignant neoplasm of lower-inner quadrant of left female breast: Secondary | ICD-10-CM | POA: Insufficient documentation

## 2016-03-18 DIAGNOSIS — I2510 Atherosclerotic heart disease of native coronary artery without angina pectoris: Secondary | ICD-10-CM | POA: Diagnosis not present

## 2016-03-18 DIAGNOSIS — C50912 Malignant neoplasm of unspecified site of left female breast: Secondary | ICD-10-CM

## 2016-03-18 MED ORDER — IOPAMIDOL (ISOVUE-300) INJECTION 61%
INTRAVENOUS | Status: AC
  Administered 2016-03-18: 75 mL
  Filled 2016-03-18: qty 75

## 2016-03-18 MED ORDER — SODIUM CHLORIDE 0.9 % IJ SOLN
INTRAMUSCULAR | Status: AC
  Filled 2016-03-18: qty 50

## 2016-03-24 ENCOUNTER — Ambulatory Visit (HOSPITAL_BASED_OUTPATIENT_CLINIC_OR_DEPARTMENT_OTHER): Payer: Medicare Other | Admitting: Oncology

## 2016-03-24 VITALS — BP 150/64 | HR 76 | Temp 97.9°F | Resp 18 | Ht 66.0 in | Wt 222.5 lb

## 2016-03-24 DIAGNOSIS — Z79810 Long term (current) use of selective estrogen receptor modulators (SERMs): Secondary | ICD-10-CM

## 2016-03-24 DIAGNOSIS — C50912 Malignant neoplasm of unspecified site of left female breast: Secondary | ICD-10-CM | POA: Diagnosis not present

## 2016-03-24 DIAGNOSIS — Z170 Estrogen receptor positive status [ER+]: Secondary | ICD-10-CM | POA: Diagnosis not present

## 2016-03-24 DIAGNOSIS — C7989 Secondary malignant neoplasm of other specified sites: Secondary | ICD-10-CM

## 2016-03-24 DIAGNOSIS — C50312 Malignant neoplasm of lower-inner quadrant of left female breast: Secondary | ICD-10-CM

## 2016-03-24 DIAGNOSIS — Z853 Personal history of malignant neoplasm of breast: Secondary | ICD-10-CM

## 2016-03-24 NOTE — Progress Notes (Signed)
ID: Tami Schmidt OB: [birth year]  MR#: [id number]  CSN#:[id number]  PCP: Lynne Logan, MD GYN:   SUCoralie Keens, MD OTHER MD: Kyung Rudd, MD  CHIEF COMPLAINT:  1)  Hx Right Breast Cancer    2)  Hx Left Breast Cancer, with recurrence and metastatic adenopathy  CURRENT TREATMENT: Tamoxifen, [Denosumab/prolia]  BREAST CANCER HISTORY: From the earlier summary:  Tami Schmidt's history of breast cancer dates back to 1993, when she underwent biopsy of her right breast in [place]. I do not have those details, but she moved to [place] shortly thereafter and it was in [place] where she had her right lumpectomy and axillary lymph node dissection. She had a total of 6 lymph nodes removed, she tells me, and all of them were clear. She does not recall the size of the primary. She was treated with adjuvant radiation and then received tamoxifen between 1993 and 1998.  In 2002 she had a local recurrence in the right breast, and underwent right mastectomy. She received tamoxifen between 2002 and 2007.  In 2004 she underwent a left mastectomy and sentinel lymph node sampling for a 9 mm mucinous carcinoma, grade 2, multifocal, associated with ductal carcinoma in situ. The single sentinel lymph node was negative. Margins were ample. She did not receive adjuvant radiation, but continued on tamoxifen as noted above, until 2007.  More recently, in July of 2014, the patient noted a "pimple" in the left chest wall, superior to her prior mastectomy incision. She brought this to the attention of her surgeon, Dr. Ninfa Linden and he describes a dimple area with a firm mass underneath. He also describes a large lipoma in the left clavicular area and a second one on her abdominal wall. Both of these were symptomatic. All 3 masses were removed 01/26/2013. The pathology from this procedure (SZA [id number]) showed, in the chest wall, and invasive ductal carcinoma measuring grossly 1.3 cm. The lesion abutted the closest inked  resection margin. The other 2 masses proved to be lipomas.  The patient's subsequent history is as detailed below  INTERVAL HISTORY: Tami Schmidt returns today for follow-up of her recurrent estrogen receptor positive breast cancer. The interval history is generally unremarkable. She continues on tamoxifen, with good tolerance. She doesn't have "anymore hot flashes and I did before". She does not have any vaginal wetness or dryness. She is obtaining that drug currently at  REVIEW OF SYSTEMS: She has arthritis in her right hip. This has been evaluated by orthopedics And she received a shot which she says did not work very well. She also has pain in her right shoulder area. She does "has no energy". She is not exercising at all. Her sugars are well controlled on her current regimen. A detailed review of systems today was otherwise stable  PAST MEDICAL HISTORY: Past Medical History:  Diagnosis Date  . Arthritis   . Breast cancer ([place])    b/l mastectomies hx  . Cancer [facility])    breast  . 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 Diabetes mellitus    fasting 90-100  . HTN (hypertension) 03/25/2013  . Hx of radiation therapy    breasts hx  . Hypercholesterolemia   . Hypertension   . Lymphedema of arm    left arm  . Type II or unspecified type diabetes mellitus without mention of complication, not stated as uncontrolled 03/25/2013    PAST SURGICAL HISTORY: Past Surgical History:  Procedure Laterality Date  . ABDOMINAL HYSTERECTOMY    .  ANKLE SURGERY Right 1995  . APPENDECTOMY    . BREAST SURGERY Bilateral    mastectomy  . HERNIA REPAIR  04-26-2010  . MASS EXCISION Left 01/26/2013   Procedure: EXCISION LEFT CHEST WALL MASS AND LEFT ABDOMNAL WALL MASS;  Surgeon: Harl Bowie, MD;  Location: [facility];  Service: General;  Laterality: Left;  Marland Kitchen MASS EXCISION Left 09/20/2014   Procedure: EXCISION OF LEFT CHEST WALL MASS;  Surgeon: Coralie Keens, MD;  Location: [facility];  Service: General;  Laterality: Left;    FAMILY HISTORY The patient's father died from a heart attack at the age of 52. The patient's mother died at the age of 28 from myocarditis. The patient had no brothers, one sister. There is no history of breast or ovarian cancer in the family to her knowledge  GYNECOLOGIC HISTORY:   (Reviewed 09/22/2013) Menarche age 70, first live birth age 73, the patient is Tami Schmidt P5. She underwent hysterectomy in 1984. She did not use hormone replacement.  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 Their children are: Tami Schmidt who lives in [place] and teaches theater there; Tami Schmidt who works as a Social worker in [place]; Tami Schmidt who lives in [place] and is a "radio personality"; Tami Schmidt, who is a Biomedical scientist in [place], and Tami Schmidt who is a musician living in [place]. The patient has 2 grandchildren. She attends a [employer]    ADVANCED DIRECTIVES: Not in place   HEALTH MAINTENANCE:  (Updated 09/22/2013) Social History  Substance Use Topics  . Smoking status: Former Smoker    Types: Cigarettes    Quit date: 04/14/1972  . Smokeless tobacco: Never Used  . Alcohol use Yes     Comment: occasional    Colonoscopy: 2011  PAP: Status post hysterectomy  Bone density: 1993 ; repeat 12/27/2013 showed osteopenia with a T score of -1.8  Lipid panel: not on file  Allergies  Allergen Reactions  . Bactrim Other (See Comments)    Swelling to the mouth and face.  . Lisinopril Other (See Comments)    Swelling to the mouth and face.  Michail Sermon Other (See Comments)    Swelling to the mouth and face.    Current Outpatient Prescriptions  Medication Sig Dispense Refill  . amLODipine (NORVASC) 10 MG tablet Take 10 mg by mouth daily.    Marland Kitchen aspirin EC 81 MG tablet Take 1 tablet (81 mg total) by mouth daily.    . fish oil-omega-3 fatty acids 1000  MG capsule Take 2 g by mouth daily.    . Insulin Isophane Human (HUMULIN N [place]) Inject 20-40 Units into the skin every morning. 40 units in the am, 20 units in the evening    . losartan-hydrochlorothiazide (HYZAAR) 100-25 MG per tablet Take 1 tablet by mouth daily.     . metFORMIN (GLUCOPHAGE) 500 MG tablet Take 500 mg by mouth daily with breakfast.    . tamoxifen (NOLVADEX) 20 MG tablet Take 1 tablet (20 mg total) by mouth daily. 90 tablet 4   No current facility-administered medications for this visit.     OBJECTIVE: Older African American woman in no acute distress Vitals:   03/24/16 1236  BP: (!) 150/64  Pulse: 76  Resp: 18  Temp: 97.9 F (36.6 C)     Body mass index is 35.91 kg/m.    ECOG FS: 1 Filed Weights  03/24/16 1236  Weight: 222 lb 8 oz (100.9 kg)    Sclerae unicteric, EOMs intact Oropharynx clear and moist No cervical or supraclavicular adenopathy Lungs no rales or rhonchi Heart regular rate and rhythm Abd soft, nontender, positive bowel sounds; severe truncal obesity MSK no focal spinal tenderness, chronic left grade 1upper extremity lymphedema Neuro: nonfocal, well oriented, appropriate affect Breasts: status post bilateral mastectomies. There is no evidence of local recurrence. Both axillae are benign   LAB RESULTS:  Lab Results  Component Value Date   WBC 6.6 03/17/2016   NEUTROABS 3.9 03/17/2016   HGB 12.7 03/17/2016   HCT 40.5 03/17/2016   MCV 78.8 (L) 03/17/2016   PLT 213 03/17/2016      Chemistry      Component Value Date/Time   NA 141 03/17/2016 1129   K 3.8 03/17/2016 1129   CL 106 09/18/2014 1440   CO2 25 03/17/2016 1129   BUN 14.4 03/17/2016 1129   CREATININE 0.7 03/17/2016 1129      Component Value Date/Time   CALCIUM 9.8 03/17/2016 1129   ALKPHOS 60 03/17/2016 1129   AST 18 03/17/2016 1129   ALT 16 03/17/2016 1129   BILITOT 0.58 03/17/2016 1129      STUDIES: Ct Chest W Contrast  Result Date: 03/18/2016 CLINICAL DATA:   Evaluate for breast cancer recurrence. EXAM: CT CHEST WITH CONTRAST TECHNIQUE: Multidetector CT imaging of the chest was performed during intravenous contrast administration. CONTRAST:  24m ISOVUE-300 IOPAMIDOL (ISOVUE-300) INJECTION 61% COMPARISON:  07/17/2015 FINDINGS: Cardiovascular: Mild cardiac enlargement. Aortic atherosclerosis is noted. No pericardial effusion. Calcifications within the LAD and left circumflex and RCA coronary arteries noted. Mediastinum/Nodes: 1.9 cm nodule is again noted in the left lobe of thyroid gland, image 24 of series 2. No enlarged mediastinal or hilar lymph nodes. Lungs/Pleura: No pleural fluid. Nodular density within the posterior right base measures 6 mm, image 123 of series 7. Stable from previous exam. Posterior right upper lobe lung nodule measure 4 mm, image 47 of series 7. Unchanged from previous exam. Upper Abdomen: Stones within the dependent portion of the gallbladder identified. No acute abnormality identified. Musculoskeletal: There is degenerative disc disease noted within the thoracic spine. No aggressive lytic or sclerotic bone lesions identified. Previous bilateral mastectomy. IMPRESSION: 1. Stable CT of the chest. No specific findings identified to suggest metastatic disease. 2. Small pulmonary nodules within the right lung are unchanged from previous exam. 3. Aortic atherosclerosis and coronary artery calcification. Electronically Signed   By: TKerby MoorsM.D.   On: 03/18/2016 16:49     ASSESSMENT: 82y.o. [place] woman  (1) [place] to [place], treated with radiation and then tamoxifen for 5 years  (2) status post right mastectomy 2002 for a recurrence in the right breast, treated adjuvantly with tamoxifen for an additional 5 years  (3) status post left mastectomy and sentinel lymph node sampling 03/07/2003 for a lower inner quadrant pT1b,  pN0, stage IA mucinous breast cancer, grade 2; no radiation therapy was given. Tamoxifen was continued until 2009  RECURRENT DISEASE: (4) excisional biopsy of a left chest wall mass 01/26/2013 showing carcinoma consistent with invasive ductal carcinoma, estrogen receptor 100% positive, progesterone receptor 0% positive, with an MIB-1 of 22% and no HER-2 amplification.   (5)  PET scan on 03/03/2013 confirmed extensive metastatic adenopathy in the left axilla, left subpectoral musculature, and left supraclavicular region  (  6)  started on anastrozole daily beginning 03/01/2013, interrupted during radiation therapy, resumed March 2015, discontinued March 2016 because of fatigue and arthralgias  (7) radiation therapy 03/31/2013 through 05/23/2013 Site/dose:   The patient was treated initially with a forward treatment planning technique to the left chest wall in addition to treatment to the supraclavicular region. This consisted of a 3-D conformal technique. The patient was treated in this fashion to a dose of 50.4 gray. The patient then received a 14 gray boost treatment using an electron field. The total dose was 64.4 gray.  (8) Osteopenia, zometa yearly started 07/27/2014, but poorly tolerated.   (a) bone density scan on 12/27/13 showed a t-score of -1.8   (b) started Ddnosumab/Prolia April 2017, canceled after one dose per patient  (9) tamoxifen started 07/27/2014  PLAN:  Tami Schmidt is now 3 years out from biopsy-proven local regional recurrence of her breast cancer, with no evidence of disease activity. His is very favorable.  She is tolerating the tamoxifen well. We are going to continue that at least for 2 more years but possibly longer. Ideally we would switch to an aromatase inhibitor but she had very little tolerance for that. We will return to that possibility at the end of the tamoxifen treatment  As far as her energy problem is concerned [facility]  [facility]. She does have Silver sneakers and she does have a trainer there that she can work with. That will help her energy and it would also help her diabetes  At this point I feel comfortable seeing her on a once a year basis. Specifically she will see me October 2018. We will do lab work and a repeat CT of the chest before that visit   Chauncey Cruel, MD   03/24/2016 12:55 PM

## 2016-11-17 ENCOUNTER — Other Ambulatory Visit: Payer: Self-pay | Admitting: Oncology

## 2016-11-17 DIAGNOSIS — C50312 Malignant neoplasm of lower-inner quadrant of left female breast: Secondary | ICD-10-CM

## 2016-12-01 ENCOUNTER — Ambulatory Visit (INDEPENDENT_AMBULATORY_CARE_PROVIDER_SITE_OTHER): Payer: Commercial Managed Care - HMO | Admitting: Orthopaedic Surgery

## 2016-12-23 ENCOUNTER — Other Ambulatory Visit: Payer: Self-pay

## 2016-12-23 DIAGNOSIS — C50312 Malignant neoplasm of lower-inner quadrant of left female breast: Secondary | ICD-10-CM

## 2016-12-23 MED ORDER — TAMOXIFEN CITRATE 20 MG PO TABS: 20.0000 mg | ORAL_TABLET | Freq: Every day | ORAL | 0 refills | Status: DC

## 2016-12-25 ENCOUNTER — Encounter (HOSPITAL_COMMUNITY): Payer: Self-pay

## 2016-12-25 NOTE — Pre-Procedure Instructions (Addendum)
Tami Schmidt  12/25/2016      [place], [place] - [address] [place] [address] Phone: [phone] Fax: [place], [place] [id number],  [place] [address] [place] [address] Phone: [phone] Fax: [phone]  [place], [place] [address] [place] [place] [address] Phone: [phone] Fax: [phone]    Your procedure is scheduled on January 05, 2017.  Report to [facility] Admitting at 10:45 A.M.  Call this number if you have problems the morning of surgery:  [phone]   Remember:  Do not eat food or drink liquids after midnight.  Continue all other medications as directed by your physician except follow these medication instructions before surgery   Take these medicines the morning of surgery with A SIP OF WATER: Amlodipine (Norvasc) Hydrocodone-acetaminophen (Norco) Tamoxifen (Nolvadex)  7 days prior to surgery STOP taking any Meloxicam (Mobic), Aspirin, Aleve, Naproxen, Ibuprofen, Motrin, Advil, Goody's, BC's, all herbal medications, fish oil, and all vitamins    How to Manage Your Diabetes Before and After Surgery  Why is it important to control my blood sugar before and after surgery? . Improving blood sugar levels before and after surgery helps healing and can limit problems. . A way of improving blood sugar control is eating a healthy diet by: o  Eating less sugar and carbohydrates o  Increasing activity/exercise o  Talking with your doctor about reaching your blood sugar goals . High blood sugars (greater than 180 mg/dL) can raise your risk of infections and slow your recovery, so you will need to focus on controlling your diabetes during the weeks before surgery. . Make sure that the doctor who takes care of your diabetes knows  about your planned surgery including the date and location.  How do I manage my blood sugar before surgery? . Check your blood sugar at least 4 times a day, starting 2 days before surgery, to make sure that the level is not too high or low. o Check your blood sugar the morning of your surgery when you wake up and every 2 hours until you get to the Short Stay unit. . If your blood sugar is less than 70 mg/dL, you will need to treat for low blood sugar: o Do not take insulin. o Treat a low blood sugar (less than 70 mg/dL) with  cup of clear juice (cranberry or apple), 4 glucose tablets, OR glucose gel. o Recheck blood sugar in 15 minutes after treatment (to make sure it is greater than 70 mg/dL). If your blood sugar is not greater than 70 mg/dL on recheck, call [phone] for further instructions. . Report your blood sugar to the short stay nurse when you get to Short Stay.  . If you are admitted to the hospital after surgery: o Your blood sugar will be checked by the staff and you will probably be given insulin after surgery (instead of oral diabetes medicines) to make sure you have good blood sugar levels. o The goal for blood sugar control after surgery is 80-180 mg/dL.   WHAT DO I DO ABOUT MY DIABETES MEDICATION?   Marland Kitchen Do not take oral diabetes medicines (pills) the morning of surgery. - DO NOT take your metformin the day of surgery  . THE MORNING OF SURGERY, take 20 units of Humulin N insulin.  Marland Kitchen  The day of surgery, do not take other diabetes injectables, including Byetta (exenatide), Bydureon (exenatide ER), Victoza (liraglutide), or Trulicity (dulaglutide).    Do not wear jewelry, make-up or nail polish.  Do not wear lotions, powders, or perfumes, or deodorant.  Do not shave 48 hours prior to surgery.  Men may shave face and neck.  Do not bring valuables to the hospital.  [facility] is not responsible for any belongings or valuables.  Contacts, eyeglasses, dentures or  bridgework may not be worn into surgery.  Leave your suitcase in the car.  After surgery it may be brought to your room.  For patients admitted to the hospital, discharge time will be determined by your treatment team.  Patients discharged the day of surgery will not be allowed to drive home.   Name and phone number of your driver:    Special instructions:   [place]- Preparing For Surgery  Before surgery, you can play an important role. Because skin is not sterile, your skin needs to be as free of germs as possible. You can reduce the number of germs on your skin by washing with CHG (chlorahexidine gluconate) Soap before surgery.  CHG is an antiseptic cleaner which kills germs and bonds with the skin to continue killing germs even after washing.  Please do not use if you have an allergy to CHG or antibacterial soaps. If your skin becomes reddened/irritated stop using the CHG.  Do not shave (including legs and underarms) for at least 48 hours prior to first CHG shower. It is OK to shave your face.  Please follow these instructions carefully.   1. Shower the NIGHT BEFORE SURGERY and the MORNING OF SURGERY with CHG.   2. If you chose to wash your hair, wash your hair first as usual with your normal shampoo.  3. After you shampoo, rinse your hair and body thoroughly to remove the shampoo.  4. Use CHG as you would any other liquid soap. You can apply CHG directly to the skin and wash gently with a scrungie or a clean washcloth.   5. Apply the CHG Soap to your body ONLY FROM THE NECK DOWN.  Do not use on open wounds or open sores. Avoid contact with your eyes, ears, mouth and genitals (private parts). Wash genitals (private parts) with your normal soap.  6. Wash thoroughly, paying special attention to the area where your surgery will be performed.  7. Thoroughly rinse your body with warm water from the neck down.  8. DO NOT shower/wash with your normal soap after using and rinsing off  the CHG Soap.  9. Pat yourself dry with a CLEAN TOWEL.   10. Wear CLEAN PAJAMAS   11. Place CLEAN SHEETS on your bed the night of your first shower and DO NOT SLEEP WITH PETS.    Day of Surgery: Shower as stated above. Do not apply any deodorants/lotions.  Please wear clean clothes to the hospital/surgery center.      Please read over the following fact sheets that you were given. Pain Booklet, Coughing and Deep Breathing, Total Joint Packet, MRSA Information and Surgical Site Infection Prevention

## 2016-12-26 ENCOUNTER — Ambulatory Visit (HOSPITAL_COMMUNITY)
Admission: RE | Admit: 2016-12-26 | Discharge: 2016-12-26 | Disposition: A | Discharge status: Home / Self Care | Payer: Medicare Other | Source: Ambulatory Visit | Attending: Orthopedic Surgery | Admitting: Orthopedic Surgery

## 2016-12-26 ENCOUNTER — Encounter (HOSPITAL_COMMUNITY)
Admission: RE | Admit: 2016-12-26 | Discharge: 2016-12-26 | Disposition: A | Discharge status: Home / Self Care | Payer: Medicare Other | Source: Ambulatory Visit | Attending: Orthopedic Surgery | Admitting: Orthopedic Surgery

## 2016-12-26 ENCOUNTER — Encounter (HOSPITAL_COMMUNITY): Payer: Self-pay

## 2016-12-26 DIAGNOSIS — J479 Bronchiectasis, uncomplicated: Secondary | ICD-10-CM | POA: Diagnosis not present

## 2016-12-26 DIAGNOSIS — Z01810 Encounter for preprocedural cardiovascular examination: Secondary | ICD-10-CM | POA: Diagnosis not present

## 2016-12-26 DIAGNOSIS — I493 Ventricular premature depolarization: Secondary | ICD-10-CM | POA: Diagnosis not present

## 2016-12-26 DIAGNOSIS — M1611 Unilateral primary osteoarthritis, right hip: Secondary | ICD-10-CM | POA: Insufficient documentation

## 2016-12-26 DIAGNOSIS — I517 Cardiomegaly: Secondary | ICD-10-CM | POA: Diagnosis not present

## 2016-12-26 DIAGNOSIS — Z01818 Encounter for other preprocedural examination: Secondary | ICD-10-CM

## 2016-12-26 LAB — PROTIME-INR
INR: 1.13
Prothrombin Time: 14.5 seconds (ref 11.4–15.2)

## 2016-12-26 LAB — CBC WITH DIFFERENTIAL/PLATELET
BASOS ABS: 0 10*3/uL (ref 0.0–0.1)
BASOS PCT: 0 %
Eosinophils Absolute: 0.2 10*3/uL (ref 0.0–0.7)
Eosinophils Relative: 3 %
HEMATOCRIT: 40.7 % (ref 36.0–46.0)
HEMOGLOBIN: 13.2 g/dL (ref 12.0–15.0)
LYMPHS PCT: 37 %
Lymphs Abs: 2.5 10*3/uL (ref 0.7–4.0)
MCH: 25 pg — ABNORMAL LOW (ref 26.0–34.0)
MCHC: 32.4 g/dL (ref 30.0–36.0)
MCV: 76.9 fL — AB (ref 78.0–100.0)
MONO ABS: 0.4 10*3/uL (ref 0.1–1.0)
Monocytes Relative: 5 %
NEUTROS ABS: 3.7 10*3/uL (ref 1.7–7.7)
NEUTROS PCT: 55 %
Platelets: 202 10*3/uL (ref 150–400)
RBC: 5.29 MIL/uL — AB (ref 3.87–5.11)
RDW: 15.7 % — ABNORMAL HIGH (ref 11.5–15.5)
WBC: 6.8 10*3/uL (ref 4.0–10.5)

## 2016-12-26 LAB — BASIC METABOLIC PANEL
ANION GAP: 9 (ref 5–15)
BUN: 12 mg/dL (ref 6–20)
CALCIUM: 9.3 mg/dL (ref 8.9–10.3)
CO2: 24 mmol/L (ref 22–32)
Chloride: 105 mmol/L (ref 101–111)
Creatinine, Ser: 0.72 mg/dL (ref 0.44–1.00)
GLUCOSE: 116 mg/dL — AB (ref 65–99)
Potassium: 3.5 mmol/L (ref 3.5–5.1)
Sodium: 138 mmol/L (ref 135–145)

## 2016-12-26 LAB — ABO/RH: ABO/RH(D): B POS

## 2016-12-26 LAB — TYPE AND SCREEN
ABO/RH(D): B POS
Antibody Screen: NEGATIVE

## 2016-12-26 LAB — GLUCOSE, CAPILLARY: Glucose-Capillary: 121 mg/dL — ABNORMAL HIGH (ref 65–99)

## 2016-12-26 LAB — URINALYSIS, ROUTINE W REFLEX MICROSCOPIC
Bilirubin Urine: NEGATIVE
GLUCOSE, UA: NEGATIVE mg/dL
Hgb urine dipstick: NEGATIVE
Ketones, ur: NEGATIVE mg/dL
LEUKOCYTES UA: NEGATIVE
Nitrite: NEGATIVE
Protein, ur: NEGATIVE mg/dL
SPECIFIC GRAVITY, URINE: 1.021 (ref 1.005–1.030)
pH: 6 (ref 5.0–8.0)

## 2016-12-26 LAB — SURGICAL PCR SCREEN
MRSA, PCR: NEGATIVE
STAPHYLOCOCCUS AUREUS: POSITIVE — AB

## 2016-12-26 LAB — HEMOGLOBIN A1C
Hgb A1c MFr Bld: 6.1 % — ABNORMAL HIGH (ref 4.8–5.6)
MEAN PLASMA GLUCOSE: 128.37 mg/dL

## 2016-12-26 LAB — APTT: APTT: 26 s (ref 24–36)

## 2016-12-26 NOTE — Progress Notes (Signed)
PCP: Donald Prose, MD  Cardiologist: pt denies  EKG: denies past year  Stress test: pt denies  ECHO: pt denies  Cardiac Cath: pt denies  Chest x-ray: denies past year

## 2016-12-26 NOTE — Progress Notes (Signed)
Mupirocin Ointment called into [facility] on [address] for positive PCR of Staph. Pt notified and voiced understanding.

## 2017-01-02 MED ORDER — CEFAZOLIN SODIUM-DEXTROSE 2-4 GM/100ML-% IV SOLN
2.0000 g | INTRAVENOUS | Status: AC
  Administered 2017-01-05: 2 g via INTRAVENOUS
  Filled 2017-01-02: qty 100

## 2017-01-02 MED ORDER — SODIUM CHLORIDE 0.9 % IV SOLN
2000.0000 mg | INTRAVENOUS | Status: AC
  Administered 2017-01-05: 2000 mg via TOPICAL
  Filled 2017-01-02: qty 20

## 2017-01-02 MED ORDER — LACTATED RINGERS IV SOLN: INTRAVENOUS | Status: DC

## 2017-01-03 DIAGNOSIS — M1611 Unilateral primary osteoarthritis, right hip: Secondary | ICD-10-CM | POA: Diagnosis present

## 2017-01-03 NOTE — H&P (Signed)
TOTAL HIP ADMISSION H&P  Patient is admitted for right total hip arthroplasty.  Subjective:  Chief Complaint: right hip pain  HPI: Tami Schmidt, 83 y.o. female, has a history of pain and functional disability in the right hip(s) due to arthritis and patient has failed non-surgical conservative treatments for greater than 12 weeks to include NSAID's and/or analgesics, use of assistive devices, weight reduction as appropriate and activity modification.  Onset of symptoms was gradual starting 1 years ago with gradually worsening course since that time.The patient noted no past surgery on the right hip(s).  Patient currently rates pain in the right hip at 10 out of 10 with activity. Patient has night pain, worsening of pain with activity and weight bearing, trendelenberg gait, pain that interfers with activities of daily living and pain with passive range of motion. Patient has evidence of joint space narrowing by imaging studies. This condition presents safety issues increasing the risk of falls.   There is no current active infection.  Patient Active Problem List   Diagnosis Date Noted  . Lymphedema of upper extremity 01/17/2014  . Osteopenia 01/17/2014  . Breast cancer of lower-inner quadrant of left female breast ([place]) 06/16/2013  . Type II or unspecified type diabetes mellitus without mention of complication, not stated as uncontrolled 03/25/2013  . HTN (hypertension) 03/25/2013  . Chest wall recurrence of breast cancer ([place]) 02/21/2013  . Abdominal wall mass 01/14/2013  . Abdominal distension 06/14/2012   Past Medical History:  Diagnosis Date  . Arthritis   . Breast cancer ([place])    b/l mastectomies hx  . Cancer [facility])    breast  . 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[place]) 03/25/2013  . Diabetes mellitus    fasting 90-100  . HTN (hypertension) 03/25/2013  . Hx of radiation therapy    breasts hx  . Hypercholesterolemia   . Hypertension   . Lymphedema of arm    left arm  . Type  II or unspecified type diabetes mellitus without mention of complication, not stated as uncontrolled 03/25/2013    Past Surgical History:  Procedure Laterality Date  . ABDOMINAL HYSTERECTOMY    . ANKLE SURGERY Right 1995  . APPENDECTOMY    . BREAST SURGERY Bilateral    mastectomy  . HERNIA REPAIR  04-26-2010  . MASS EXCISION Left 01/26/2013   Procedure: EXCISION LEFT CHEST WALL MASS AND LEFT ABDOMNAL WALL MASS;  Surgeon: Harl Bowie, MD;  Location: [facility];  Service: General;  Laterality: Left;  Marland Kitchen MASS EXCISION Left 09/20/2014   Procedure: EXCISION OF LEFT CHEST WALL MASS;  Surgeon: Coralie Keens, MD;  Location: [facility];  Service: General;  Laterality: Left;    No prescriptions prior to admission.   Allergies  Allergen Reactions  . Bactrim Swelling    SWELLING OF MOUTH/FACE.  Marland Kitchen Lisinopril Swelling    SWELLING OF MOUTH/FACE.  Marland Kitchen Vasotec Swelling    SWELLING OF MOUTH/FACE.    Social History  Substance Use Topics  . Smoking status: Former Smoker    Types: Cigarettes    Quit date: 04/14/1972  . Smokeless tobacco: Never Used  . Alcohol use Yes     Comment: occasional    No family history on file.   Review of Systems  Constitutional: Positive for diaphoresis.  HENT: Negative.   Eyes: Negative.   Respiratory: Negative.   Cardiovascular:       HTN and enlarged heart  Gastrointestinal: Negative.   Genitourinary: Negative.   Musculoskeletal: Positive  for joint pain.  Skin: Negative.   Neurological: Negative.   Endo/Heme/Allergies: Negative.   Psychiatric/Behavioral: Negative.     Objective:  Physical Exam  Constitutional: She is oriented to person, place, and time. She appears well-developed and well-nourished.  HENT:  Head: Normocephalic and atraumatic.  Eyes: Pupils are equal, round, and reactive to light.  Neck: Normal range of motion. Neck supple.  Cardiovascular: Intact distal pulses.   Respiratory: Effort normal.  Musculoskeletal:   Patient walks with a profound right-sided limp, internal rotation is to 5 with severe pain external rotation is to 10, foot tap is negative.  Patient has a moderate panniculus.  Skin over the anterior lateral aspect of the hip is intact with no ulcerations.    Neurological: She is alert and oriented to person, place, and time.  Skin: Skin is warm and dry.  Psychiatric: She has a normal mood and affect. Her behavior is normal. Judgment and thought content normal.    Vital signs in last 24 hours:    Labs:   Estimated body mass index is 34.57 kg/m as calculated from the following:   Height as of 12/26/16: 5\' 6"  (1.676 m).   Weight as of 12/26/16: 97.2 kg (214 lb 3.2 oz).   Imaging Review Plain radiographs demonstrate bone-on-bone arthritic changes on the AP and crosstable lateral views.   Assessment/Plan:  End stage arthritis, right hip(s)  The patient history, physical examination, clinical judgement of the provider and imaging studies are consistent with end stage degenerative joint disease of the right hip(s) and total hip arthroplasty is deemed medically necessary. The treatment options including medical management, injection therapy, arthroscopy and arthroplasty were discussed at length. The risks and benefits of total hip arthroplasty were presented and reviewed. The risks due to aseptic loosening, infection, stiffness, dislocation/subluxation,  thromboembolic complications and other imponderables were discussed.  The patient acknowledged the explanation, agreed to proceed with the plan and consent was signed. Patient is being admitted for inpatient treatment for surgery, pain control, PT, OT, prophylactic antibiotics, VTE prophylaxis, progressive ambulation and ADL's and discharge planning.The patient is planning to be discharged home with home health services

## 2017-01-05 ENCOUNTER — Inpatient Hospital Stay (HOSPITAL_COMMUNITY): Payer: Medicare Other | Admitting: Certified Registered Nurse Anesthetist

## 2017-01-05 ENCOUNTER — Inpatient Hospital Stay (HOSPITAL_COMMUNITY): Payer: Medicare Other | Admitting: Emergency Medicine

## 2017-01-05 ENCOUNTER — Encounter (HOSPITAL_COMMUNITY)
Admission: RE | Disposition: A | Discharge status: Home Health | Payer: Self-pay | Source: Ambulatory Visit | Attending: Orthopedic Surgery

## 2017-01-05 ENCOUNTER — Inpatient Hospital Stay (HOSPITAL_COMMUNITY)
Admission: RE | Admit: 2017-01-05 | Discharge: 2017-01-08 | DRG: 470 | Disposition: A | Discharge status: Home Health | Payer: Medicare Other | Source: Ambulatory Visit | Attending: Orthopedic Surgery | Admitting: Orthopedic Surgery

## 2017-01-05 ENCOUNTER — Encounter (HOSPITAL_COMMUNITY): Payer: Self-pay

## 2017-01-05 ENCOUNTER — Inpatient Hospital Stay (HOSPITAL_COMMUNITY): Payer: Medicare Other

## 2017-01-05 DIAGNOSIS — Z87891 Personal history of nicotine dependence: Secondary | ICD-10-CM

## 2017-01-05 DIAGNOSIS — Z9181 History of falling: Secondary | ICD-10-CM

## 2017-01-05 DIAGNOSIS — C50919 Malignant neoplasm of unspecified site of unspecified female breast: Secondary | ICD-10-CM | POA: Diagnosis present

## 2017-01-05 DIAGNOSIS — Z90710 Acquired absence of both cervix and uterus: Secondary | ICD-10-CM

## 2017-01-05 DIAGNOSIS — Z888 Allergy status to other drugs, medicaments and biological substances status: Secondary | ICD-10-CM

## 2017-01-05 DIAGNOSIS — E119 Type 2 diabetes mellitus without complications: Secondary | ICD-10-CM | POA: Diagnosis present

## 2017-01-05 DIAGNOSIS — M1611 Unilateral primary osteoarthritis, right hip: Principal | ICD-10-CM | POA: Diagnosis present

## 2017-01-05 DIAGNOSIS — Z419 Encounter for procedure for purposes other than remedying health state, unspecified: Secondary | ICD-10-CM

## 2017-01-05 DIAGNOSIS — Z9013 Acquired absence of bilateral breasts and nipples: Secondary | ICD-10-CM

## 2017-01-05 DIAGNOSIS — D62 Acute posthemorrhagic anemia: Secondary | ICD-10-CM | POA: Diagnosis not present

## 2017-01-05 DIAGNOSIS — I10 Essential (primary) hypertension: Secondary | ICD-10-CM | POA: Diagnosis present

## 2017-01-05 DIAGNOSIS — Z23 Encounter for immunization: Secondary | ICD-10-CM

## 2017-01-05 DIAGNOSIS — Z923 Personal history of irradiation: Secondary | ICD-10-CM

## 2017-01-05 DIAGNOSIS — Z881 Allergy status to other antibiotic agents status: Secondary | ICD-10-CM | POA: Diagnosis not present

## 2017-01-05 HISTORY — PX: TOTAL HIP ARTHROPLASTY: SHX124

## 2017-01-05 LAB — GLUCOSE, CAPILLARY
Glucose-Capillary: 91 mg/dL (ref 65–99)
Glucose-Capillary: 76 mg/dL (ref 65–99)
Glucose-Capillary: 144 mg/dL — ABNORMAL HIGH (ref 65–99)
Glucose-Capillary: 187 mg/dL — ABNORMAL HIGH (ref 65–99)

## 2017-01-05 LAB — HEMOGLOBIN A1C
Hgb A1c MFr Bld: 6 % — ABNORMAL HIGH (ref 4.8–5.6)
MEAN PLASMA GLUCOSE: 125.5 mg/dL

## 2017-01-05 SURGERY — ARTHROPLASTY, HIP, TOTAL, ANTERIOR APPROACH
Anesthesia: Monitor Anesthesia Care | Site: Hip | Laterality: Right

## 2017-01-05 MED ORDER — DIPHENHYDRAMINE HCL 12.5 MG/5ML PO ELIX: 12.5000 mg | ORAL_SOLUTION | ORAL | Status: DC | PRN

## 2017-01-05 MED ORDER — MENTHOL 3 MG MT LOZG: 1.0000 | LOZENGE | OROMUCOSAL | Status: DC | PRN

## 2017-01-05 MED ORDER — AMLODIPINE BESYLATE 10 MG PO TABS
10.0000 mg | ORAL_TABLET | Freq: Every day | ORAL | Status: DC
  Administered 2017-01-06 – 2017-01-08 (×3): 10 mg via ORAL
  Filled 2017-01-05 (×3): qty 1

## 2017-01-05 MED ORDER — LACTATED RINGERS IV SOLN
INTRAVENOUS | Status: DC
  Administered 2017-01-05 (×2): via INTRAVENOUS

## 2017-01-05 MED ORDER — METHOCARBAMOL 500 MG PO TABS
500.0000 mg | ORAL_TABLET | Freq: Four times a day (QID) | ORAL | Status: DC | PRN
  Administered 2017-01-05 – 2017-01-07 (×3): 500 mg via ORAL
  Filled 2017-01-05 (×2): qty 1

## 2017-01-05 MED ORDER — LOSARTAN POTASSIUM 50 MG PO TABS
100.0000 mg | ORAL_TABLET | Freq: Every day | ORAL | Status: DC
  Administered 2017-01-06 – 2017-01-08 (×3): 100 mg via ORAL
  Filled 2017-01-05 (×3): qty 2

## 2017-01-05 MED ORDER — LIDOCAINE 2% (20 MG/ML) 5 ML SYRINGE
INTRAMUSCULAR | Status: AC
  Filled 2017-01-05: qty 5

## 2017-01-05 MED ORDER — HYDROMORPHONE HCL 1 MG/ML IJ SOLN
0.5000 mg | INTRAMUSCULAR | Status: DC | PRN
  Administered 2017-01-05: 0.5 mg via INTRAVENOUS

## 2017-01-05 MED ORDER — FLEET ENEMA 7-19 GM/118ML RE ENEM: 1.0000 | ENEMA | Freq: Once | RECTAL | Status: DC | PRN

## 2017-01-05 MED ORDER — INSULIN NPH (HUMAN) (ISOPHANE) 100 UNIT/ML ~~LOC~~ SUSP: 40.0000 [IU] | Freq: Every day | SUBCUTANEOUS | Status: DC

## 2017-01-05 MED ORDER — ACETAMINOPHEN 650 MG RE SUPP: 650.0000 mg | Freq: Four times a day (QID) | RECTAL | Status: DC | PRN

## 2017-01-05 MED ORDER — METOCLOPRAMIDE HCL 5 MG/ML IJ SOLN
INTRAMUSCULAR | Status: AC
  Administered 2017-01-05: 10 mg via INTRAVENOUS
  Filled 2017-01-05: qty 2

## 2017-01-05 MED ORDER — PHENYLEPHRINE HCL 10 MG/ML IJ SOLN
INTRAVENOUS | Status: DC | PRN
  Administered 2017-01-05: 30 ug/min via INTRAVENOUS

## 2017-01-05 MED ORDER — PROPOFOL 10 MG/ML IV BOLUS
INTRAVENOUS | Status: DC | PRN
  Administered 2017-01-05: 20 mg via INTRAVENOUS

## 2017-01-05 MED ORDER — PROPOFOL 500 MG/50ML IV EMUL
INTRAVENOUS | Status: DC | PRN
  Administered 2017-01-05: 50 ug/kg/min via INTRAVENOUS

## 2017-01-05 MED ORDER — ACETAMINOPHEN 325 MG PO TABS
ORAL_TABLET | ORAL | Status: AC
  Filled 2017-01-05: qty 2

## 2017-01-05 MED ORDER — METOCLOPRAMIDE HCL 5 MG/ML IJ SOLN
5.0000 mg | Freq: Three times a day (TID) | INTRAMUSCULAR | Status: DC | PRN
  Administered 2017-01-05: 10 mg via INTRAVENOUS

## 2017-01-05 MED ORDER — ASPIRIN EC 325 MG PO TBEC: 325.0000 mg | DELAYED_RELEASE_TABLET | Freq: Two times a day (BID) | ORAL | 0 refills | Status: DC

## 2017-01-05 MED ORDER — SODIUM CHLORIDE 0.9 % IV SOLN
1000.0000 mg | INTRAVENOUS | Status: AC
  Administered 2017-01-05: 1000 mg via INTRAVENOUS
  Filled 2017-01-05: qty 10

## 2017-01-05 MED ORDER — TRANEXAMIC ACID 1000 MG/10ML IV SOLN
1000.0000 mg | Freq: Once | INTRAVENOUS | Status: AC
  Administered 2017-01-05: 1000 mg via INTRAVENOUS
  Filled 2017-01-05: qty 10

## 2017-01-05 MED ORDER — HYDROMORPHONE HCL 1 MG/ML IJ SOLN
INTRAMUSCULAR | Status: AC
  Administered 2017-01-05: 0.5 mg via INTRAVENOUS
  Filled 2017-01-05: qty 1

## 2017-01-05 MED ORDER — FENTANYL CITRATE (PF) 100 MCG/2ML IJ SOLN
INTRAMUSCULAR | Status: DC | PRN
  Administered 2017-01-05 (×5): 50 ug via INTRAVENOUS

## 2017-01-05 MED ORDER — DEXAMETHASONE SODIUM PHOSPHATE 10 MG/ML IJ SOLN
10.0000 mg | Freq: Once | INTRAMUSCULAR | Status: AC
  Administered 2017-01-06: 10 mg via INTRAVENOUS
  Filled 2017-01-05: qty 1

## 2017-01-05 MED ORDER — BUPIVACAINE IN DEXTROSE 0.75-8.25 % IT SOLN
INTRATHECAL | Status: DC | PRN
  Administered 2017-01-05: 15 mg via INTRATHECAL

## 2017-01-05 MED ORDER — INSULIN ASPART 100 UNIT/ML ~~LOC~~ SOLN
0.0000 [IU] | Freq: Three times a day (TID) | SUBCUTANEOUS | Status: DC
  Administered 2017-01-06 – 2017-01-07 (×4): 3 [IU] via SUBCUTANEOUS
  Administered 2017-01-07: 2 [IU] via SUBCUTANEOUS
  Administered 2017-01-07: 3 [IU] via SUBCUTANEOUS

## 2017-01-05 MED ORDER — ALUM & MAG HYDROXIDE-SIMETH 200-200-20 MG/5ML PO SUSP: 30.0000 mL | ORAL | Status: DC | PRN

## 2017-01-05 MED ORDER — DOCUSATE SODIUM 100 MG PO CAPS
100.0000 mg | ORAL_CAPSULE | Freq: Two times a day (BID) | ORAL | Status: DC
  Administered 2017-01-05 – 2017-01-08 (×6): 100 mg via ORAL
  Filled 2017-01-05 (×6): qty 1

## 2017-01-05 MED ORDER — SENNOSIDES-DOCUSATE SODIUM 8.6-50 MG PO TABS: 1.0000 | ORAL_TABLET | Freq: Every evening | ORAL | Status: DC | PRN

## 2017-01-05 MED ORDER — TAMOXIFEN CITRATE 10 MG PO TABS
20.0000 mg | ORAL_TABLET | Freq: Every day | ORAL | Status: DC
  Administered 2017-01-06 – 2017-01-08 (×3): 20 mg via ORAL
  Filled 2017-01-05 (×4): qty 2

## 2017-01-05 MED ORDER — METHOCARBAMOL 1000 MG/10ML IJ SOLN
500.0000 mg | Freq: Four times a day (QID) | INTRAVENOUS | Status: DC | PRN
  Filled 2017-01-05: qty 5

## 2017-01-05 MED ORDER — ACETAMINOPHEN 325 MG PO TABS
650.0000 mg | ORAL_TABLET | Freq: Four times a day (QID) | ORAL | Status: DC | PRN
  Administered 2017-01-07: 650 mg via ORAL
  Filled 2017-01-05: qty 2

## 2017-01-05 MED ORDER — METFORMIN HCL 500 MG PO TABS
500.0000 mg | ORAL_TABLET | Freq: Every day | ORAL | Status: DC
  Administered 2017-01-06 – 2017-01-08 (×3): 500 mg via ORAL
  Filled 2017-01-05 (×3): qty 1

## 2017-01-05 MED ORDER — METHOCARBAMOL 500 MG PO TABS
ORAL_TABLET | ORAL | Status: AC
  Filled 2017-01-05: qty 1

## 2017-01-05 MED ORDER — HYDROCHLOROTHIAZIDE 25 MG PO TABS
25.0000 mg | ORAL_TABLET | Freq: Every day | ORAL | Status: DC
  Administered 2017-01-06 – 2017-01-08 (×3): 25 mg via ORAL
  Filled 2017-01-05 (×3): qty 1

## 2017-01-05 MED ORDER — 0.9 % SODIUM CHLORIDE (POUR BTL) OPTIME
TOPICAL | Status: DC | PRN
  Administered 2017-01-05 (×2): 1000 mL

## 2017-01-05 MED ORDER — BUPIVACAINE-EPINEPHRINE (PF) 0.5% -1:200000 IJ SOLN
INTRAMUSCULAR | Status: AC
  Filled 2017-01-05: qty 60

## 2017-01-05 MED ORDER — FENTANYL CITRATE (PF) 100 MCG/2ML IJ SOLN: 25.0000 ug | INTRAMUSCULAR | Status: DC | PRN

## 2017-01-05 MED ORDER — CHLORHEXIDINE GLUCONATE 4 % EX LIQD: 60.0000 mL | Freq: Once | CUTANEOUS | Status: DC

## 2017-01-05 MED ORDER — METOCLOPRAMIDE HCL 5 MG PO TABS: 5.0000 mg | ORAL_TABLET | Freq: Three times a day (TID) | ORAL | Status: DC | PRN

## 2017-01-05 MED ORDER — GABAPENTIN 300 MG PO CAPS
300.0000 mg | ORAL_CAPSULE | Freq: Three times a day (TID) | ORAL | Status: DC
  Administered 2017-01-05 – 2017-01-08 (×8): 300 mg via ORAL
  Filled 2017-01-05 (×7): qty 1

## 2017-01-05 MED ORDER — ONDANSETRON HCL 4 MG PO TABS: 4.0000 mg | ORAL_TABLET | Freq: Four times a day (QID) | ORAL | Status: DC | PRN

## 2017-01-05 MED ORDER — KCL IN DEXTROSE-NACL 20-5-0.45 MEQ/L-%-% IV SOLN
INTRAVENOUS | Status: DC
  Administered 2017-01-05 – 2017-01-06 (×2): via INTRAVENOUS
  Filled 2017-01-05 (×2): qty 1000

## 2017-01-05 MED ORDER — BUPIVACAINE-EPINEPHRINE (PF) 0.5% -1:200000 IJ SOLN
INTRAMUSCULAR | Status: DC | PRN
  Administered 2017-01-05: 50 mL

## 2017-01-05 MED ORDER — ASPIRIN EC 325 MG PO TBEC
325.0000 mg | DELAYED_RELEASE_TABLET | Freq: Every day | ORAL | Status: DC
  Administered 2017-01-06 – 2017-01-08 (×3): 325 mg via ORAL
  Filled 2017-01-05 (×3): qty 1

## 2017-01-05 MED ORDER — ONDANSETRON HCL 4 MG/2ML IJ SOLN: 4.0000 mg | Freq: Four times a day (QID) | INTRAMUSCULAR | Status: DC | PRN

## 2017-01-05 MED ORDER — PHENOL 1.4 % MT LIQD: 1.0000 | OROMUCOSAL | Status: DC | PRN

## 2017-01-05 MED ORDER — TIZANIDINE HCL 2 MG PO TABS: 2.0000 mg | ORAL_TABLET | Freq: Four times a day (QID) | ORAL | 0 refills | Status: DC | PRN

## 2017-01-05 MED ORDER — BUPIVACAINE LIPOSOME 1.3 % IJ SUSP
20.0000 mL | INTRAMUSCULAR | Status: AC
  Administered 2017-01-05: 20 mL
  Filled 2017-01-05: qty 20

## 2017-01-05 MED ORDER — OXYCODONE-ACETAMINOPHEN 5-325 MG PO TABS: 1.0000 | ORAL_TABLET | ORAL | 0 refills | Status: DC | PRN

## 2017-01-05 MED ORDER — LOSARTAN POTASSIUM-HCTZ 100-25 MG PO TABS: 1.0000 | ORAL_TABLET | Freq: Every day | ORAL | Status: DC

## 2017-01-05 MED ORDER — BISACODYL 5 MG PO TBEC: 5.0000 mg | DELAYED_RELEASE_TABLET | Freq: Every day | ORAL | Status: DC | PRN

## 2017-01-05 MED ORDER — OXYCODONE HCL 5 MG PO TABS
5.0000 mg | ORAL_TABLET | ORAL | Status: DC | PRN
  Administered 2017-01-06 – 2017-01-07 (×3): 5 mg via ORAL
  Filled 2017-01-05 (×3): qty 1

## 2017-01-05 MED ORDER — FENTANYL CITRATE (PF) 250 MCG/5ML IJ SOLN
INTRAMUSCULAR | Status: AC
  Filled 2017-01-05: qty 5

## 2017-01-05 MED ORDER — INSULIN NPH (HUMAN) (ISOPHANE) 100 UNIT/ML ~~LOC~~ SUSP
40.0000 [IU] | Freq: Every day | SUBCUTANEOUS | Status: DC
  Administered 2017-01-06 – 2017-01-08 (×3): 40 [IU] via SUBCUTANEOUS
  Filled 2017-01-05: qty 10

## 2017-01-05 SURGICAL SUPPLY — 45 items
BAG DECANTER FOR FLEXI CONT (MISCELLANEOUS) ×3 IMPLANT
BLADE SAW SGTL 18X1.27X75 (BLADE) ×2 IMPLANT
BLADE SAW SGTL 18X1.27X75MM (BLADE) ×1
CAPT HIP TOTAL 2 ×3 IMPLANT
COVER PERINEAL POST (MISCELLANEOUS) ×3 IMPLANT
COVER SURGICAL LIGHT HANDLE (MISCELLANEOUS) ×3 IMPLANT
DRAPE C-ARM 42X72 X-RAY (DRAPES) ×3 IMPLANT
DRAPE STERI IOBAN 125X83 (DRAPES) ×3 IMPLANT
DRAPE U-SHAPE 47X51 STRL (DRAPES) ×6 IMPLANT
DRSG AQUACEL AG ADV 3.5X10 (GAUZE/BANDAGES/DRESSINGS) ×3 IMPLANT
DURAPREP 26ML APPLICATOR (WOUND CARE) ×3 IMPLANT
ELECT BLADE 4.0 EZ CLEAN MEGAD (MISCELLANEOUS) ×3
ELECT REM PT RETURN 9FT ADLT (ELECTROSURGICAL) ×3
ELECTRODE BLDE 4.0 EZ CLN MEGD (MISCELLANEOUS) ×1 IMPLANT
ELECTRODE REM PT RTRN 9FT ADLT (ELECTROSURGICAL) ×1 IMPLANT
FACESHIELD WRAPAROUND (MASK) ×6 IMPLANT
GLOVE BIO SURGEON STRL SZ7.5 (GLOVE) ×3 IMPLANT
GLOVE BIO SURGEON STRL SZ8.5 (GLOVE) ×3 IMPLANT
GLOVE BIOGEL PI IND STRL 8 (GLOVE) ×1 IMPLANT
GLOVE BIOGEL PI IND STRL 9 (GLOVE) ×1 IMPLANT
GLOVE BIOGEL PI INDICATOR 8 (GLOVE) ×2
GLOVE BIOGEL PI INDICATOR 9 (GLOVE) ×2
GOWN STRL REUS W/ TWL LRG LVL3 (GOWN DISPOSABLE) ×1 IMPLANT
GOWN STRL REUS W/ TWL XL LVL3 (GOWN DISPOSABLE) ×2 IMPLANT
GOWN STRL REUS W/TWL LRG LVL3 (GOWN DISPOSABLE) ×2
GOWN STRL REUS W/TWL XL LVL3 (GOWN DISPOSABLE) ×4
HEAD FEMORAL 32 CERAMIC (Hips) ×3 IMPLANT
KIT BASIN OR (CUSTOM PROCEDURE TRAY) ×3 IMPLANT
KIT ROOM TURNOVER OR (KITS) ×3 IMPLANT
MANIFOLD NEPTUNE II (INSTRUMENTS) ×3 IMPLANT
NEEDLE HYPO 22GX1.5 SAFETY (NEEDLE) ×6 IMPLANT
NS IRRIG 1000ML POUR BTL (IV SOLUTION) ×3 IMPLANT
PACK TOTAL JOINT (CUSTOM PROCEDURE TRAY) ×3 IMPLANT
PAD ARMBOARD 7.5X6 YLW CONV (MISCELLANEOUS) ×6 IMPLANT
SUT VIC AB 1 CTX 36 (SUTURE) ×2
SUT VIC AB 1 CTX36XBRD ANBCTR (SUTURE) ×1 IMPLANT
SUT VIC AB 2-0 CT1 27 (SUTURE) ×2
SUT VIC AB 2-0 CT1 TAPERPNT 27 (SUTURE) ×1 IMPLANT
SUT VIC AB 3-0 PS2 18 (SUTURE) ×2
SUT VIC AB 3-0 PS2 18XBRD (SUTURE) ×1 IMPLANT
SYR CONTROL 10ML LL (SYRINGE) ×6 IMPLANT
TOWEL OR 17X24 6PK STRL BLUE (TOWEL DISPOSABLE) ×3 IMPLANT
TOWEL OR 17X26 10 PK STRL BLUE (TOWEL DISPOSABLE) ×3 IMPLANT
TRAY CATH 16FR W/PLASTIC CATH (SET/KITS/TRAYS/PACK) IMPLANT
YANKAUER SUCT BULB TIP NO VENT (SUCTIONS) ×3 IMPLANT

## 2017-01-05 NOTE — Transfer of Care (Signed)
Immediate Anesthesia Transfer of Care Note  Patient: Tami Schmidt  Procedure(s) Performed: Procedure(s): TOTAL HIP ARTHROPLASTY ANTERIOR APPROACH (Right)  Patient Location: PACU  Anesthesia Type:MAC and Spinal  Level of Consciousness: awake, alert , oriented and patient cooperative  Airway & Oxygen Therapy: Patient Spontanous Breathing and Patient connected to nasal cannula oxygen  Post-op Assessment: Report given to RN and Post -op Vital signs reviewed and stable  Post vital signs: Reviewed and stable  Last Vitals:  Vitals:   01/05/17 1058  BP: (!) 183/74  Pulse: 74  Resp: 18  Temp: 36.8 C  SpO2: 100%    Last Pain: There were no vitals filed for this visit.       Complications: No apparent anesthesia complications

## 2017-01-05 NOTE — Progress Notes (Signed)
Pt placed on hold - no bed available at this time

## 2017-01-05 NOTE — Anesthesia Preprocedure Evaluation (Addendum)
Anesthesia Evaluation  Patient identified by MRN, date of birth, ID band Patient awake    Reviewed: Allergy & Precautions, H&P , NPO status , Patient's Chart, lab work & pertinent test results  Airway Mallampati: II  TM Distance: >3 FB Neck ROM: Full    Dental no notable dental hx. (+) Teeth Intact, Dental Advisory Given   Pulmonary neg pulmonary ROS, former smoker,    Pulmonary exam normal breath sounds clear to auscultation       Cardiovascular hypertension, Pt. on medications  Rhythm:Regular Rate:Normal     Neuro/Psych negative neurological ROS  negative psych ROS   GI/Hepatic negative GI ROS, Neg liver ROS,   Endo/Other  diabetes, Insulin Dependent, Oral Hypoglycemic Agents  Renal/GU negative Renal ROS  negative genitourinary   Musculoskeletal  (+) Arthritis , Osteoarthritis,    Abdominal   Peds  Hematology negative hematology ROS (+)   Anesthesia Other Findings   Reproductive/Obstetrics negative OB ROS                            Anesthesia Physical Anesthesia Plan  ASA: III  Anesthesia Plan: Spinal   Post-op Pain Management:    Induction: Intravenous  PONV Risk Score and Plan: 3 and Ondansetron, Dexamethasone and Propofol infusion  Airway Management Planned: Simple Face Mask  Additional Equipment:   Intra-op Plan:   Post-operative Plan:   Informed Consent: I have reviewed the patients History and Physical, chart, labs and discussed the procedure including the risks, benefits and alternatives for the proposed anesthesia with the patient or authorized representative who has indicated his/her understanding and acceptance.   Dental advisory given  Plan Discussed with: CRNA  Anesthesia Plan Comments:         Anesthesia Quick Evaluation

## 2017-01-05 NOTE — Anesthesia Procedure Notes (Signed)
Spinal  Patient location during procedure: OR Start time: 01/05/2017 12:19 PM End time: 01/05/2017 12:24 PM Staffing Anesthesiologist: Roderic Palau Performed: anesthesiologist  Preanesthetic Checklist Completed: patient identified, surgical consent, pre-op evaluation, timeout performed, IV checked, risks and benefits discussed and monitors and equipment checked Spinal Block Patient position: sitting Prep: DuraPrep Patient monitoring: cardiac monitor, continuous pulse ox and blood pressure Approach: midline Location: L3-4 Injection technique: single-shot Needle Needle type: Pencan  Needle gauge: 24 G Needle length: 9 cm Assessment Sensory level: T8 Additional Notes Functioning IV was confirmed and monitors were applied. Sterile prep and drape, including hand hygiene and sterile gloves were used. The patient was positioned and the spine was prepped. The skin was anesthetized with lidocaine.  Free flow of clear CSF was obtained prior to injecting local anesthetic into the CSF.  The spinal needle aspirated freely following injection.  The needle was carefully withdrawn.  The patient tolerated the procedure well.

## 2017-01-05 NOTE — Discharge Instructions (Signed)

## 2017-01-05 NOTE — Op Note (Signed)
OPERATIVE REPORT    DATE OF PROCEDURE:  01/05/2017       PREOPERATIVE DIAGNOSIS:  RIGHT HIP OSTEOARTHRITIS                                                          POSTOPERATIVE DIAGNOSIS:  RIGHT HIP OSTEOARTHRITIS                                                           PROCEDURE: Anterior R total hip arthroplasty using a 50 mm DePuy Pinnacle  Cup, [employer], 0-degree polyethylene liner, a +5 32 mm ceramic head, a 3 Hi Depuy Triloc stem   SURGEON: Mariha Sleeper J    ASSISTANT:   Eric K[employer]  (present throughout entire procedure and necessary for timely completion of the procedure)   ANESTHESIA: Spinal BLOOD LOSS: 400 FLUID REPLACEMENT: 1600 crystalloid Antibiotic: 2gm ancef Tranexamic Acid: 1gm IV, 2gm Topical COMPLICATIONS: none    INDICATIONS FOR PROCEDURE: A 83 y.o. year-old With  RIGHT HIP OSTEOARTHRITIS   for 3 years, x-rays show bone-on-bone arthritic changes, and osteophytes. Despite conservative measures with observation, anti-inflammatory medicine, narcotics, use of a cane, has severe unremitting pain and can ambulate only a few blocks before resting. Patient desires elective R total hip arthroplasty to decrease pain and increase function. The risks, benefits, and alternatives were discussed at length including but not limited to the risks of infection, bleeding, nerve injury, stiffness, blood clots, the need for revision surgery, cardiopulmonary complications, among others, and they were willing to proceed. Questions answered     PROCEDURE IN DETAIL: The patient was identified by armband,  received preoperative IV antibiotics in the holding area at [facility], taken to the operating room , appropriate anesthetic monitors  were attached and  anesthesia was induced with the patienton the gurney. The HANA boots were applied to the feet and he was then transferred to the HANA table with a peroneal post and support underneath the non-operative le,  which was locked in 5 lb traction. Theoperative lower extremity was then prepped and draped in the usual sterile fashion from just above the iliac crest to the knee. And a timeout procedure was performed. We then made a 18 cm incision along the interval at the leading edge of the tensor fascia lata of starting at 2 cm lateral to and 2 cm distal to the ASIS. Small bleeders in the skin and subcutaneous tissue identified and cauterized we dissected down to the fascia and made an incision in the fascia allowing [place] to elevate the fascia of the tensor muscle and exploited the interval between the rectus and the tensor fascia lata. A Hohmann retractor was then placed along the superior neck of the femur and a Cobra retractor along the inferior neck of the femur we teed the capsule starting out at the superior anterior aspect of the acetabulum going distally and made the T along the neck both leaflets of the T were tagged with #2 Ethibond suture. Cobra retractors were then placed along the inferior and superior neck allowing [place] to perform a standard neck cut and  removed the femoral head with a power corkscrew. We then placed a right angle Hohmann retractor along the anterior aspect of the acetabulum a spiked Cobra in the cotyloid notch and posteriorly a Muelller retractor. We then sequentially reamed up to a 49 mm basket reamer obtaining good coverage in all quadrants, verified by C-arm imaging. Under C-arm control with and hammered into place a 50 mm Pinnacle cup in 45 of abduction and 15 of anteversion. The cup seated nicely and required no supplemental screws. We then placed a central hole Eliminator and a 0 polyethylene liner. The foot was then externally rotated to 110, the HANA elevator was placed around the flare of the greater trochanter and the limb was extended and abducted delivering the proximal femur up into the wound. A medium Hohmann retractor was placed over the greater trochanter and a Mueller retractor  along the posterior femoral neck completing the exposure. We then performed releases superiorly and and inferiorly of the capsule going back to the pirformis fossa superiorly and to the lesser trochanter inferiorly. We then entered the proximal femur with the box cutting offset chisel followed by, a canal sounder, the chili pepper and broaching up to a 3 broach. This seated nicely and we reamed the calcar. A trial reduction was performed with a  =5 mm 32 mm head.The limb lengths were excellent the hip was stable in 90 of external rotation. At this point the trial components removed and we hammered into place a # 3 hi  Offset Tri-Lock stem with Gryption coating. A + 5 32 mm ceramic ball was then hammered into place the hip was reduced and final C-arm images obtained. The wound was thoroughly irrigated with normal saline solution. We repaired the ant capsule and the tensor fascia lot a with running 0 vicryl suture. the subcutaneous tissue was closed with 2-0 and 3-0 Vicryl suture followed by an Aquacil dressing. At this point the patient was awaken and transferred to hospital gurney without difficulty. The subcutaneous tissue with 0 and 2-0 undyed Vicryl suture and the skin with running  3-0 vicryl subcuticular suture. Aquacil dressing was applied. The patient was then unclamped, rolled supine, awaken extubated and taken to recovery room without difficulty in stable condition.   Frederik Pear J 01/05/2017, 1:54 PM

## 2017-01-05 NOTE — Progress Notes (Signed)
Pt offered meal tray at 1700 & again at 1815- refused at this time

## 2017-01-05 NOTE — Interval H&P Note (Signed)
History and Physical Interval Note:  01/05/2017 11:17 AM  Tami Schmidt  has presented today for surgery, with the diagnosis of RIGHT HIP OSTEOARTHRITIS  The various methods of treatment have been discussed with the patient and family. After consideration of risks, benefits and other options for treatment, the patient has consented to  Procedure(s): TOTAL HIP ARTHROPLASTY ANTERIOR APPROACH (Right) as a surgical intervention .  The patient's history has been reviewed, patient examined, no change in status, stable for surgery.  I have reviewed the patient's chart and labs.  Questions were answered to the patient's satisfaction.     Kerin Salen

## 2017-01-06 ENCOUNTER — Encounter (HOSPITAL_COMMUNITY): Payer: Self-pay | Admitting: General Practice

## 2017-01-06 LAB — CBC
HEMATOCRIT: 30.2 % — AB (ref 36.0–46.0)
HEMOGLOBIN: 10.1 g/dL — AB (ref 12.0–15.0)
MCH: 25.4 pg — AB (ref 26.0–34.0)
MCHC: 33.4 g/dL (ref 30.0–36.0)
MCV: 75.9 fL — AB (ref 78.0–100.0)
Platelets: 147 10*3/uL — ABNORMAL LOW (ref 150–400)
RBC: 3.98 MIL/uL (ref 3.87–5.11)
RDW: 16 % — ABNORMAL HIGH (ref 11.5–15.5)
WBC: 8.5 10*3/uL (ref 4.0–10.5)

## 2017-01-06 LAB — GLUCOSE, CAPILLARY
GLUCOSE-CAPILLARY: 180 mg/dL — AB (ref 65–99)
GLUCOSE-CAPILLARY: 194 mg/dL — AB (ref 65–99)
Glucose-Capillary: 74 mg/dL (ref 65–99)
Glucose-Capillary: 197 mg/dL — ABNORMAL HIGH (ref 65–99)

## 2017-01-06 MED ORDER — INFLUENZA VAC SPLIT HIGH-DOSE 0.5 ML IM SUSY
0.5000 mL | PREFILLED_SYRINGE | INTRAMUSCULAR | Status: AC
  Administered 2017-01-08: 0.5 mL via INTRAMUSCULAR
  Filled 2017-01-06 (×2): qty 0.5

## 2017-01-06 NOTE — Progress Notes (Signed)
PATIENT ID: Tami Schmidt  MRN: [id number]  DOB/AGE:  [birth year] / 83 y.o.  1 Day Post-Op Procedure(s) (LRB): TOTAL HIP ARTHROPLASTY ANTERIOR APPROACH (Right)    PROGRESS NOTE Subjective: Patient is alert, oriented, no Nausea, no Vomiting, yes passing gas, . Taking PO with small bites. Denies SOB, Chest or Calf Pain. Using Incentive Spirometer, PAS in place. Ambulate WBAT  Patient reports pain as  5-6/10  .    Objective: Vital signs in last 24 hours: Vitals:   01/05/17 1915 01/05/17 2008 01/06/17 0031 01/06/17 0554  BP:  (!) 153/58 137/62 (!) 152/60  Pulse: (!) 104 (!) 104 90 86  Resp: (!) 22  19 17   Temp:  98.1 F (36.7 C) 98.7 F (37.1 C) 98 F (36.7 C)  TempSrc:  Oral Oral Oral  SpO2: 96% 96% 96% 99%  Weight:          Intake/Output from previous day: I/O last 3 completed shifts: In: 7124 [P.O.:180; I.V.:1600] Out: 1150 [Urine:800; Blood:350]   Intake/Output this shift: No intake/output data recorded.   LABORATORY DATA:  Recent Labs  01/05/17 1754 01/05/17 2142 01/06/17 0626  GLUCAP 144* 187* 180*    Examination: Neurologically intact Neurovascular intact Sensation intact distally Intact pulses distally Dorsiflexion/Plantar flexion intact Incision: scant drainage No cellulitis present Compartment soft} XR AP&Lat of hip shows well placed\fixed THA  Assessment:   1 Day Post-Op Procedure(s) (LRB): TOTAL HIP ARTHROPLASTY ANTERIOR APPROACH (Right) ADDITIONAL DIAGNOSIS:  Expected Acute Blood Loss Anemia, Diabetes, Hypertension and breast cancer  Plan: PT/OT WBAT, THA  DVT Prophylaxis: SCDx72 hrs, ASA 325 mg BID x 2 weeks  DISCHARGE PLAN: Home  DISCHARGE NEEDS: HHPT, Walker and 3-in-1 comode seat

## 2017-01-06 NOTE — Progress Notes (Signed)
Physical Therapy Treatment Patient Details Name: Tami Schmidt MRN: [id number] DOB: 07/15/1932 Today's Date: 01/06/2017    History of Present Illness Pt is an 83 y.o. female now s/p R THA (direct anterior approach) on 01/05/17. Pertinent PMH includes DM II, arthritis, breast CA, HTN, peripheral neuropathy.    PT Comments    Pt slowly progressing with mobility. Continues to require increased assist for bed mobility secondary to pain and weakness; requiring modA to assist trunk into sitting and scoot hips to EOB. Amb in room with RW and min guard for balance; further mobility limited secondary to pain and fatigue. Pt reports her husband will be able to provide this level of assist at home if needed at d/c. Will continue to work on therex, bed mobility, gait training, and stair training to maximize functional mobility and independence prior to d/c with HHPT.   Follow Up Recommendations  DC plan and follow up therapy as arranged by surgeon;Supervision for mobility/OOB     Equipment Recommendations  None recommended by PT (DME delivered)    Recommendations for Other Services OT consult     Precautions / Restrictions Precautions Precautions: None Restrictions Weight Bearing Restrictions: Yes RLE Weight Bearing: Weight bearing as tolerated    Mobility  Bed Mobility Overal bed mobility: Needs Assistance Bed Mobility: Supine to Sit     Supine to sit: Mod assist;HOB elevated     General bed mobility comments: Significant increased time and effort for supine<>sit, pt able to slowly move BLEs to EOB with no assist, but continues to require modA to assist her trunk into sitting and scoot hips to EOB.   Transfers Overall transfer level: Needs assistance Equipment used: Rolling walker (2 wheeled) Transfers: Sit to/from Stand Sit to Stand: Min guard         General transfer comment: Stood with RW and min guard for balance; cues for technique with RW. Increased time and effort, which  pt reports is due to R hip pain.  Ambulation/Gait Ambulation/Gait assistance: Min guard Ambulation Distance (Feet): 40 Feet Assistive device: Rolling walker (2 wheeled) Gait Pattern/deviations: Step-to pattern;Step-through pattern;Antalgic;Decreased weight shift to right;Trunk flexed Gait velocity: Decreased Gait velocity interpretation: <1.8 ft/sec, indicative of risk for recurrent falls General Gait Details: Amb in room with RW and min guard for balance; cues to keep RW closer to body. Pt required less cues to maintain upright posture, but continues to require VCs for step-through pattern   Stairs            Wheelchair Mobility    Modified Rankin (Stroke Patients Only)       Balance Overall balance assessment: Needs assistance Sitting-balance support: Bilateral upper extremity supported;Feet supported Sitting balance-Leahy Scale: Fair     Standing balance support: Bilateral upper extremity supported;During functional activity Standing balance-Leahy Scale: Poor Standing balance comment: Reliant on BUE support                            Cognition Arousal/Alertness: Awake/alert Behavior During Therapy: WFL for tasks assessed/performed Overall Cognitive Status: Within Functional Limits for tasks assessed                                        Exercises      General Comments        Pertinent Vitals/Pain Pain Assessment: Faces Faces Pain Scale: Hurts even more Pain  Location: R hip with movement Pain Descriptors / Indicators: Aching;Operative site guarding;Discomfort;Grimacing Pain Intervention(s): Limited activity within patient's tolerance;Monitored during session;Ice applied    Home Living Family/patient expects to be discharged to:: Private residence Living Arrangements: Spouse/significant other                  Prior Function            PT Goals (current goals can now be found in the care plan section) Acute Rehab PT  Goals Patient Stated Goal: Return home PT Goal Formulation: With patient Time For Goal Achievement: 01/20/17 Potential to Achieve Goals: Good Progress towards PT goals: Progressing toward goals    Frequency    7X/week      PT Plan Current plan remains appropriate    Co-evaluation              AM-PAC PT "6 Clicks" Daily Activity  Outcome Measure  Difficulty turning over in bed (including adjusting bedclothes, sheets and blankets)?: Unable Difficulty moving from lying on back to sitting on the side of the bed? : Unable Difficulty sitting down on and standing up from a chair with arms (e.g., wheelchair, bedside commode, etc,.)?: A Little Help needed moving to and from a bed to chair (including a wheelchair)?: A Little Help needed walking in hospital room?: A Little Help needed climbing 3-5 steps with a railing? : A Lot 6 Click Score: 13    End of Session Equipment Utilized During Treatment: Gait belt Activity Tolerance: Patient tolerated treatment well;Patient limited by pain Patient left: in chair;with call bell/phone within reach Nurse Communication: Mobility status PT Visit Diagnosis: Other abnormalities of gait and mobility (R26.89);Pain Pain - Right/Left: Right Pain - part of body: Hip     Time: 1607-3710 PT Time Calculation (min) (ACUTE ONLY): 25 min  Charges:  $Gait Training: 8-22 mins $Therapeutic Activity: 8-22 mins                    G Codes:      Mabeline Caras, PT, DPT Acute Rehab Services  Pager: [place] 01/06/2017, 4:33 PM

## 2017-01-06 NOTE — Evaluation (Signed)
Physical Therapy Evaluation Patient Details Name: Tami Schmidt MRN: [id number] DOB: 01-15-1933 Today's Date: 01/06/2017   History of Present Illness  Pt is an 83 y.o. female now s/p R THA (direct anterior approach) on 01/05/17. Pertinent PMH includes DM II, arthritis, breast CA, HTN, peripheral neuropathy.     Clinical Impression  Pt presents with an overall decrease in functional mobility secondary to above. PTA, pt mod indep with SPC and lives with husband who is available for 24/7 assist if needed. Educ on precautions, positioning, therex, and importance of mobility. Today, pt required modA for bed mobility, and able to amb in room with RW and min guard for balance; further mobility limited by pain and fatigue. Pt would benefit from continued acute PT services to maximize functional mobility and independence prior to d/c with HHPT.     Follow Up Recommendations DC plan and follow up therapy as arranged by surgeon;Supervision for mobility/OOB    Equipment Recommendations  None recommended by PT (DME already delivered)    Recommendations for Other Services OT consult     Precautions / Restrictions Precautions Precautions: None Restrictions Weight Bearing Restrictions: Yes RLE Weight Bearing: Weight bearing as tolerated      Mobility  Bed Mobility Overal bed mobility: Needs Assistance Bed Mobility: Supine to Sit     Supine to sit: Mod assist;HOB elevated     General bed mobility comments: ModA to assist RLE to EOB and assist trunk into elevation; reliant on BUE support. ModA to scoot bilat hips to EOB. Increased time and effort secondary to RLE pain  Transfers Overall transfer level: Needs assistance Equipment used: Rolling walker (2 wheeled) Transfers: Sit to/from Stand Sit to Stand: Min assist         General transfer comment: Stood with RW and minA to assist trunk into standing; increased time and effort required secondary to R hip pain. Cues for hand placement  and technique with RW.   Ambulation/Gait Ambulation/Gait assistance: Min guard Ambulation Distance (Feet): 30 Feet Assistive device: Rolling walker (2 wheeled) Gait Pattern/deviations: Step-to pattern;Step-through pattern;Antalgic;Decreased weight shift to right;Trunk flexed Gait velocity: Decreased Gait velocity interpretation: <1.8 ft/sec, indicative of risk for recurrent falls General Gait Details: Amb in room with RW and min guard for balance; cues for technique with RW, but no physical assist required. Able to progress to step-through pattern with cues; repeated cues to encourage upright posture  Stairs            Wheelchair Mobility    Modified Rankin (Stroke Patients Only)       Balance Overall balance assessment: Needs assistance Sitting-balance support: Bilateral upper extremity supported;Feet supported Sitting balance-Leahy Scale: Fair     Standing balance support: Bilateral upper extremity supported;During functional activity Standing balance-Leahy Scale: Poor Standing balance comment: Reliant on BUE support                             Pertinent Vitals/Pain Pain Assessment: Faces Faces Pain Scale: Hurts little more Pain Location: R hip with movement Pain Descriptors / Indicators: Aching;Operative site guarding;Discomfort;Grimacing Pain Intervention(s): Limited activity within patient's tolerance;Monitored during session    Home Living Family/patient expects to be discharged to:: Private residence Living Arrangements: Spouse/significant other Available Help at Discharge: Family;Available 24 hours/day Type of Home: House Home Access: Stairs to enter   [employer] of Steps: 1 (threshold) Home Layout: Two level;Full bath on main level;Bed/bath upstairs Home Equipment: Cane - single point  Prior Function Level of Independence: Independent with assistive device(s)         Comments: Mod indep with SPC past ~1 week secondary  to R hip pain; husband drives pt     Hand Dominance        Extremity/Trunk Assessment   Upper Extremity Assessment Upper Extremity Assessment: Overall WFL for tasks assessed    Lower Extremity Assessment Lower Extremity Assessment: RLE deficits/detail;LLE deficits/detail RLE Deficits / Details: s/p R THA; hip flexion grossly 2/5, knee ext grossly 2/5 RLE Sensation: decreased light touch;history of peripheral neuropathy (R foot) LLE Sensation: history of peripheral neuropathy;decreased light touch (L foot)    Cervical / Trunk Assessment Cervical / Trunk Assessment: Kyphotic  Communication   Communication: No difficulties  Cognition Arousal/Alertness: Awake/alert Behavior During Therapy: WFL for tasks assessed/performed Overall Cognitive Status: Within Functional Limits for tasks assessed                                        General Comments General comments (skin integrity, edema, etc.): Husband present during session    Exercises Total Joint Exercises Ankle Circles/Pumps: AROM;Both;10 reps;Supine Quad Sets: Strengthening;Both;10 reps;Supine Heel Slides: AROM;Right;10 reps;Supine Marching in Standing: AAROM;Right;5 reps;Seated;Other (comment) (AAROM with BUE assist)   Assessment/Plan    PT Assessment Patient needs continued PT services  PT Problem List Decreased strength;Decreased range of motion;Decreased activity tolerance;Decreased balance;Decreased mobility;Decreased knowledge of use of DME;Pain       PT Treatment Interventions DME instruction;Gait training;Stair training;Functional mobility training;Therapeutic activities;Therapeutic exercise;Balance training;Patient/family education    PT Goals (Current goals can be found in the Care Plan section)  Acute Rehab PT Goals Patient Stated Goal: Return home PT Goal Formulation: With patient Time For Goal Achievement: 01/20/17 Potential to Achieve Goals: Good    Frequency 7X/week   Barriers  to discharge        Co-evaluation               AM-PAC PT "6 Clicks" Daily Activity  Outcome Measure Difficulty turning over in bed (including adjusting bedclothes, sheets and blankets)?: Unable Difficulty moving from lying on back to sitting on the side of the bed? : Unable Difficulty sitting down on and standing up from a chair with arms (e.g., wheelchair, bedside commode, etc,.)?: A Little Help needed moving to and from a bed to chair (including a wheelchair)?: A Little Help needed walking in hospital room?: A Little Help needed climbing 3-5 steps with a railing? : A Lot 6 Click Score: 13    End of Session Equipment Utilized During Treatment: Gait belt Activity Tolerance: Patient tolerated treatment well;Patient limited by pain Patient left: in chair;with call bell/phone within reach;with family/visitor present Nurse Communication: Mobility status PT Visit Diagnosis: Other abnormalities of gait and mobility (R26.89);Pain Pain - Right/Left: Right Pain - part of body: Hip    Time: 1093-2355 PT Time Calculation (min) (ACUTE ONLY): 30 min   Charges:   PT Evaluation $PT Eval Moderate Complexity: 1 Mod PT Treatments $Therapeutic Activity: 8-22 mins   PT G Codes:       Mabeline Caras, PT, DPT Acute Rehab Services  Pager: [place] 01/06/2017, 9:51 AM

## 2017-01-06 NOTE — Anesthesia Postprocedure Evaluation (Signed)
Anesthesia Post Note  Patient: Johnanna B Riedinger  Procedure(s) Performed: Procedure(s) (LRB): TOTAL HIP ARTHROPLASTY ANTERIOR APPROACH (Right)     Patient location during evaluation: PACU Anesthesia Type: Spinal Level of consciousness: oriented and awake and alert Pain management: pain level controlled Vital Signs Assessment: post-procedure vital signs reviewed and stable Respiratory status: spontaneous breathing, respiratory function stable and patient connected to nasal cannula oxygen Cardiovascular status: blood pressure returned to baseline and stable Postop Assessment: no headache, no backache and no apparent nausea or vomiting Anesthetic complications: no    Last Vitals:  Vitals:   01/06/17 0031 01/06/17 0554  BP: 137/62 (!) 152/60  Pulse: 90 86  Resp: 19 17  Temp: 37.1 C 36.7 C  SpO2: 96% 99%    Last Pain:  Vitals:   01/06/17 0554  TempSrc: Oral  PainSc:                  Doneen Ollinger,JAMES TERRILL

## 2017-01-07 LAB — CBC
HCT: 29.5 % — ABNORMAL LOW (ref 36.0–46.0)
HEMOGLOBIN: 9.6 g/dL — AB (ref 12.0–15.0)
MCH: 25.1 pg — AB (ref 26.0–34.0)
MCHC: 32.5 g/dL (ref 30.0–36.0)
MCV: 77 fL — AB (ref 78.0–100.0)
PLATELETS: 155 10*3/uL (ref 150–400)
RBC: 3.83 MIL/uL — AB (ref 3.87–5.11)
RDW: 16 % — ABNORMAL HIGH (ref 11.5–15.5)
WBC: 11 10*3/uL — AB (ref 4.0–10.5)

## 2017-01-07 LAB — GLUCOSE, CAPILLARY
GLUCOSE-CAPILLARY: 118 mg/dL — AB (ref 65–99)
GLUCOSE-CAPILLARY: 194 mg/dL — AB (ref 65–99)
GLUCOSE-CAPILLARY: 163 mg/dL — AB (ref 65–99)
Glucose-Capillary: 127 mg/dL — ABNORMAL HIGH (ref 65–99)
Glucose-Capillary: 151 mg/dL — ABNORMAL HIGH (ref 65–99)

## 2017-01-07 NOTE — Progress Notes (Signed)
PATIENT ID: SAREA FYFE  MRN: [id number]  DOB/AGE:  81-Oct-1934 / 81 y.o.  2 Days Post-Op Procedure(s) (LRB): TOTAL HIP ARTHROPLASTY ANTERIOR APPROACH (Right)    PROGRESS NOTE Subjective: Patient is alert, oriented, no Nausea, no Vomiting, yes passing gas, . Taking PO well. Denies SOB, Chest or Calf Pain. Using Incentive Spirometer, PAS in place. Ambulate WBAT with pt walking 40 ft with therapy Patient reports pain as  Mild to moderate .    Objective: Vital signs in last 24 hours: Vitals:   01/06/17 0554 01/06/17 1218 01/06/17 2039 01/07/17 0445  BP: (!) 152/60 (!) 125/50 (!) 146/49 (!) 142/55  Pulse: 86 80 (!) 104 93  Resp: 17 18 18 18   Temp: 98 F (36.7 C) 99 F (37.2 C) 100.2 F (37.9 C) 98.6 F (37 C)  TempSrc: Oral Oral Oral Oral  SpO2: 99% 98% 98% 99%  Weight:          Intake/Output from previous day: I/O last 3 completed shifts: In: 596 [P.O.:596] Out: 1100 [Urine:1100]   Intake/Output this shift: No intake/output data recorded.   LABORATORY DATA:  Recent Labs  01/06/17 0612  01/06/17 1216 01/06/17 1651 01/07/17 0205 01/07/17 0525  WBC 8.5  --   --   --   --  11.0*  HGB 10.1*  --   --   --   --  9.6*  HCT 30.2*  --   --   --   --  29.5*  PLT 147*  --   --   --   --  155  GLUCAP  --   < > 197* 194* 118*  --   < > = values in this interval not displayed.  Examination: Neurologically intact Neurovascular intact Sensation intact distally Intact pulses distally Dorsiflexion/Plantar flexion intact Incision: scant drainage No cellulitis present Compartment soft} XR AP&Lat of hip shows well placed\fixed THA  Assessment:   2 Days Post-Op Procedure(s) (LRB): TOTAL HIP ARTHROPLASTY ANTERIOR APPROACH (Right) ADDITIONAL DIAGNOSIS:  Expected Acute Blood Loss Anemia, Diabetes, Hypertension and breast cancer  Plan: PT/OT WBAT, THA  DVT Prophylaxis: SCDx72 hrs, ASA 325 mg BID x 2 weeks  DISCHARGE PLAN: Home  DISCHARGE NEEDS: HHPT, Walker and 3-in-1  comode seat

## 2017-01-07 NOTE — Progress Notes (Signed)
Physical Therapy Treatment Patient Details Name: Tami Schmidt MRN: [id number] DOB: [birth year] Today's Date: 01/07/2017    History of Present Illness Pt is an 83 y.o. female now s/p R THA (direct anterior approach) on 01/05/17. Pertinent PMH includes DM II, arthritis, breast CA, HTN, peripheral neuropathy.    PT Comments    Pt progressing with mobility. Able to amb 100' with RW and min guard for balance; required multiple standing rest breaks secondary to BUE fatigue and decreased endurance. Continues to require modA for bed mobility, which pt insists husband will be able to provide at home; will plan to practice bed mobility again tomorrow morning. Pt very motivated to work with therapy and return home. Will plan for stair training tomorrow morning as pt plans to ascend a flight of steps to get to her bedroom at home.    Follow Up Recommendations  DC plan and follow up therapy as arranged by surgeon;Supervision for mobility/OOB     Equipment Recommendations  None recommended by PT (DME delivered)    Recommendations for Other Services OT consult     Precautions / Restrictions Precautions Precautions: None Restrictions Weight Bearing Restrictions: Yes RLE Weight Bearing: Weight bearing as tolerated    Mobility  Bed Mobility Overal bed mobility: Needs Assistance Bed Mobility: Sit to Supine       Sit to supine: Mod assist   General bed mobility comments: ModA to assist BLEs into bed; increased time and effort secondary to pain and fatigue. Increased time to scoot up in bed and reliant on bed rails. Pt insists that husband will be able to provide this assist at home, but would still like to practice technique again tomorrow.  Transfers Overall transfer level: Needs assistance Equipment used: Rolling walker (2 wheeled) Transfers: Sit to/from Stand Sit to Stand: Supervision         General transfer comment: Increased time to position RLE for standing, but pt able to do  this with no physical assist. Supervision for safety  Ambulation/Gait Ambulation/Gait assistance: Supervision Ambulation Distance (Feet): 110 Feet Assistive device: Rolling walker (2 wheeled) Gait Pattern/deviations: Step-through pattern;Antalgic;Decreased weight shift to right;Trunk flexed Gait velocity: Decreased Gait velocity interpretation: <1.8 ft/sec, indicative of risk for recurrent falls General Gait Details: Slow, antalgic gait with RW and supervision for safety; requires cues for upright posture and to keep RW closer to body. Multiple standing rest breaks secondary to BUE fatigue and decreased endurance.    Stairs            Wheelchair Mobility    Modified Rankin (Stroke Patients Only)       Balance Overall balance assessment: Needs assistance Sitting-balance support: Bilateral upper extremity supported;Feet supported Sitting balance-Leahy Scale: Fair     Standing balance support: Bilateral upper extremity supported;No upper extremity supported;During functional activity Standing balance-Leahy Scale: Fair Standing balance comment: Able to static stand with no UE support and min guard                            Cognition Arousal/Alertness: Awake/alert Behavior During Therapy: WFL for tasks assessed/performed Overall Cognitive Status: Within Functional Limits for tasks assessed                                        Exercises      General Comments        Pertinent  Vitals/Pain Pain Assessment: Faces Faces Pain Scale: Hurts a little bit Pain Location: R hip with movement Pain Descriptors / Indicators: Aching;Operative site guarding;Discomfort Pain Intervention(s): Monitored during session;Ice applied    Home Living                      Prior Function            PT Goals (current goals can now be found in the care plan section) Acute Rehab PT Goals Patient Stated Goal: Return home PT Goal Formulation: With  patient Time For Goal Achievement: 01/20/17 Potential to Achieve Goals: Good Progress towards PT goals: Progressing toward goals    Frequency    7X/week      PT Plan Current plan remains appropriate    Co-evaluation              AM-PAC PT "6 Clicks" Daily Activity  Outcome Measure  Difficulty turning over in bed (including adjusting bedclothes, sheets and blankets)?: A Little Difficulty moving from lying on back to sitting on the side of the bed? : Unable Difficulty sitting down on and standing up from a chair with arms (e.g., wheelchair, bedside commode, etc,.)?: A Little Help needed moving to and from a bed to chair (including a wheelchair)?: A Little Help needed walking in hospital room?: A Little Help needed climbing 3-5 steps with a railing? : A Little 6 Click Score: 16    End of Session Equipment Utilized During Treatment: Gait belt Activity Tolerance: Patient tolerated treatment well;Patient limited by fatigue Patient left: in bed;with call bell/phone within reach Nurse Communication: Mobility status PT Visit Diagnosis: Other abnormalities of gait and mobility (R26.89);Pain Pain - Right/Left: Right Pain - part of body: Hip     Time: 7989-2119 PT Time Calculation (min) (ACUTE ONLY): 28 min  Charges:  $Gait Training: 8-22 mins $Therapeutic Activity: 8-22 mins                    G Codes:      Mabeline Caras, PT, DPT Acute Rehab Services  Pager: [place] 01/07/2017, 4:24 PM

## 2017-01-07 NOTE — Progress Notes (Signed)
Physical Therapy Treatment Patient Details Name: Tami Schmidt MRN: [id number] DOB: 01/22/1933 Today's Date: 01/07/2017    History of Present Illness Pt is an 83 y.o. female now s/p R THA (direct anterior approach) on 01/05/17. Pertinent PMH includes DM II, arthritis, breast CA, HTN, peripheral neuropathy.    PT Comments    Pt progressing with mobility. Able to transfer and amb with RW and supervision for safety; min guard for ascend/descending 1 step with RW to simulate ascending threshold to enter home. Complete seated therex. Will plan for gait training with longer distance this afternoon's session, in addition to further stair training (pt is able to live on main floor, but actual bedroom is up 1 flight of steps with rail). Will continue to follow acutely.   Follow Up Recommendations  DC plan and follow up therapy as arranged by surgeon;Supervision for mobility/OOB     Equipment Recommendations  None recommended by PT (DME delivered)    Recommendations for Other Services OT consult     Precautions / Restrictions Precautions Precautions: None Restrictions Weight Bearing Restrictions: Yes RLE Weight Bearing: Weight bearing as tolerated    Mobility  Bed Mobility Overal bed mobility: Needs Assistance             General bed mobility comments: Pt received sitting in chair; reports mod indep with bed mobility... will plan to work on bed mob this afternoon to ensure pt is indep  Transfers Overall transfer level: Needs assistance Equipment used: Rolling walker (2 wheeled) Transfers: Sit to/from Stand Sit to Stand: Supervision         General transfer comment: Supervision for balance; good technique with RW and no physical assist required. Increased time and effort secondary to R hip pain  Ambulation/Gait Ambulation/Gait assistance: Supervision Ambulation Distance (Feet): 50 Feet Assistive device: Rolling walker (2 wheeled) Gait Pattern/deviations: Step-through  pattern;Antalgic;Decreased weight shift to right;Trunk flexed Gait velocity: Decreased Gait velocity interpretation: <1.8 ft/sec, indicative of risk for recurrent falls General Gait Details: Slow, antalgic gait with RW and supervision for safety. Multiple standing rest breaks secondary to pain and fatigue. Cues to keep RW closer to body and for step-through pattern.    Stairs Stairs: Yes   Stair Management: No rails;Step to pattern;Backwards;Forwards;With walker Number of Stairs: 1 General stair comments: Ascended 1 step backwards with RW, educ on technique; descended 1 step forward with RW. Min guard for safety  Wheelchair Mobility    Modified Rankin (Stroke Patients Only)       Balance Overall balance assessment: Needs assistance Sitting-balance support: Bilateral upper extremity supported;Feet supported Sitting balance-Leahy Scale: Fair     Standing balance support: Bilateral upper extremity supported;During functional activity Standing balance-Leahy Scale: Poor Standing balance comment: Reliant on BUE support                            Cognition Arousal/Alertness: Awake/alert Behavior During Therapy: WFL for tasks assessed/performed Overall Cognitive Status: Within Functional Limits for tasks assessed                                        Exercises General Exercises - Lower Extremity Gluteal Sets: AROM;Both;20 reps;Seated Long Arc Quad: AROM;Right;20 reps;Seated Heel Slides: AAROM;Right;5 reps;Seated Hip Flexion/Marching: AAROM;Right;5 reps;Seated Toe Raises: AROM;Both;10 reps;Seated Heel Raises: AROM;Both;10 reps;Seated    General Comments        Pertinent Vitals/Pain  Pain Assessment: Faces Faces Pain Scale: Hurts little more Pain Location: R hip with movement Pain Descriptors / Indicators: Aching;Operative site guarding;Discomfort Pain Intervention(s): Monitored during session;Ice applied    Home Living                       Prior Function            PT Goals (current goals can now be found in the care plan section) Acute Rehab PT Goals Patient Stated Goal: Return home PT Goal Formulation: With patient Time For Goal Achievement: 01/20/17 Potential to Achieve Goals: Good Progress towards PT goals: Progressing toward goals    Frequency    7X/week      PT Plan Current plan remains appropriate    Co-evaluation              AM-PAC PT "6 Clicks" Daily Activity  Outcome Measure  Difficulty turning over in bed (including adjusting bedclothes, sheets and blankets)?: None Difficulty moving from lying on back to sitting on the side of the bed? : None Difficulty sitting down on and standing up from a chair with arms (e.g., wheelchair, bedside commode, etc,.)?: A Little Help needed moving to and from a bed to chair (including a wheelchair)?: A Little Help needed walking in hospital room?: A Little Help needed climbing 3-5 steps with a railing? : A Little 6 Click Score: 20    End of Session Equipment Utilized During Treatment: Gait belt Activity Tolerance: Patient tolerated treatment well;Patient limited by pain Patient left: in chair;with call bell/phone within reach Nurse Communication: Mobility status PT Visit Diagnosis: Other abnormalities of gait and mobility (R26.89);Pain Pain - Right/Left: Right Pain - part of body: Hip     Time: 7517-0017 PT Time Calculation (min) (ACUTE ONLY): 28 min  Charges:  $Gait Training: 8-22 mins $Therapeutic Exercise: 8-22 mins                    G Codes:      Mabeline Caras, PT, DPT Acute Rehab Services  Pager: [place] 01/07/2017, 11:13 AM

## 2017-01-08 LAB — CBC
HEMATOCRIT: 29.6 % — AB (ref 36.0–46.0)
Hemoglobin: 9.8 g/dL — ABNORMAL LOW (ref 12.0–15.0)
MCH: 25.5 pg — AB (ref 26.0–34.0)
MCHC: 33.1 g/dL (ref 30.0–36.0)
MCV: 76.9 fL — AB (ref 78.0–100.0)
PLATELETS: 135 10*3/uL — AB (ref 150–400)
RBC: 3.85 MIL/uL — ABNORMAL LOW (ref 3.87–5.11)
RDW: 16 % — AB (ref 11.5–15.5)
WBC: 11.3 10*3/uL — ABNORMAL HIGH (ref 4.0–10.5)

## 2017-01-08 LAB — GLUCOSE, CAPILLARY
GLUCOSE-CAPILLARY: 106 mg/dL — AB (ref 65–99)
Glucose-Capillary: 103 mg/dL — ABNORMAL HIGH (ref 65–99)

## 2017-01-08 NOTE — Progress Notes (Signed)
Physical Therapy Treatment Patient Details Name: Tami Schmidt MRN: [id number] DOB: [birth year] Today's Date: 01/08/2017    History of Present Illness Pt is an 83 y.o. female now s/p R THA (direct anterior approach) on 01/05/17. Pertinent PMH includes DM II, arthritis, breast CA, HTN, peripheral neuropathy.     PT Comments    Pt reviewed stair training and HEP with husband present to observe and carryover technique at d/c.  Pt reviewed seated and supine exercises only and educated on frequency to complete exercises.  Pt is ready to d/c home from a mobility standpoint.  Informed nursing.    Follow Up Recommendations  DC plan and follow up therapy as arranged by surgeon;Supervision for mobility/OOB     Equipment Recommendations  None recommended by PT (DME delievered.  )    Recommendations for Other Services OT consult     Precautions / Restrictions Precautions Precautions: None Restrictions Weight Bearing Restrictions: Yes RLE Weight Bearing: Weight bearing as tolerated    Mobility  Bed Mobility               General bed mobility comments: Pt in recliner on arrival.    Transfers Overall transfer level: Needs assistance Equipment used: Rolling walker (2 wheeled) Transfers: Sit to/from Stand Sit to Stand: Supervision         General transfer comment: Pt remains to require increased time and presents with improved carryover during eccentric loading.  Pt remains to require VCs for forward weight shifting.    Ambulation/Gait Ambulation/Gait assistance: Supervision Ambulation Distance (Feet):  (5 ft x 2 trials.  ) Assistive device: Rolling walker (2 wheeled) Gait Pattern/deviations: Step-through pattern;Antalgic;Decreased weight shift to right;Trunk flexed Gait velocity: Decreased   General Gait Details: Pt performed short trials to and from stair case.  Pt required cues for RW safety when turning and backing.     Stairs Stairs: Yes  Min assist/min guard Stair  Management: One rail Right Number of Stairs: 8 General stair comments: Pt able to achieve her goal of completing 8 stairs with Rail on the R.  Pt required increased time to ascend but able to descend at a quicker pace.  Pt's spouse present to observe stair training.    Wheelchair Mobility    Modified Rankin (Stroke Patients Only)       Balance Overall balance assessment: Needs assistance   Sitting balance-Leahy Scale: Good       Standing balance-Leahy Scale: Fair                              Cognition Arousal/Alertness: Awake/alert Behavior During Therapy: WFL for tasks assessed/performed Overall Cognitive Status: Within Functional Limits for tasks assessed                                        Exercises Total Joint Exercises Ankle Circles/Pumps: AROM;Both;10 reps;Supine Quad Sets: AROM;Right;10 reps;Supine Short Arc Quad: AROM;Right;10 reps;Supine Heel Slides: AROM;Right;10 reps;Supine Hip ABduction/ADduction: AROM;Right;10 reps;Supine Long Arc Quad: AROM;Right;10 reps;Seated    General Comments        Pertinent Vitals/Pain Pain Assessment: 0-10 Pain Score: 6  Pain Location: R hip with movement Pain Descriptors / Indicators: Aching;Operative site guarding;Discomfort Pain Intervention(s): Monitored during session;Repositioned    Home Living  Prior Function            PT Goals (current goals can now be found in the care plan section) Acute Rehab PT Goals Patient Stated Goal: Go home later today Potential to Achieve Goals: Good Progress towards PT goals: Progressing toward goals    Frequency    7X/week      PT Plan Current plan remains appropriate    Co-evaluation              AM-PAC PT "6 Clicks" Daily Activity  Outcome Measure  Difficulty turning over in bed (including adjusting bedclothes, sheets and blankets)?: A Little Difficulty moving from lying on back to sitting on the  side of the bed? : Unable Difficulty sitting down on and standing up from a chair with arms (e.g., wheelchair, bedside commode, etc,.)?: A Little Help needed moving to and from a bed to chair (including a wheelchair)?: A Little Help needed walking in hospital room?: A Little Help needed climbing 3-5 steps with a railing? : A Little 6 Click Score: 16    End of Session Equipment Utilized During Treatment: Gait belt Activity Tolerance: Patient limited by fatigue Patient left: in bed;with call bell/phone within reach Nurse Communication: Mobility status;Other (comment) (informed nursing patient is ready for d/c home.  ) PT Visit Diagnosis: Other abnormalities of gait and mobility (R26.89);Pain Pain - Right/Left: Right Pain - part of body: Hip     Time: 1232-1250 PT Time Calculation (min) (ACUTE ONLY): 18 min  Charges:  $Gait Training: 8-22 mins                    G Codes:       [name], PTA pager [phone]    [name] 01/08/2017, 1:21 PM

## 2017-01-08 NOTE — Discharge Summary (Signed)
Patient ID: Tami Schmidt MRN: [id number] DOB/AGE: [birth year] 81 y.o.  Admit date: 01/05/2017 Discharge date: 01/08/2017  Admission Diagnoses:  Principal Problem:   Osteoarthritis of right hip Active Problems:   Primary osteoarthritis of right hip   Discharge Diagnoses:  Same  Past Medical History:  Diagnosis Date  . Arthritis   . Breast cancer ([place])    b/l mastectomies hx  . Cancer [facility])    breast  . 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[place]) 03/25/2013  . Diabetes mellitus    fasting 90-100  . HTN (hypertension) 03/25/2013  . Hx of radiation therapy    breasts hx  . Hypercholesterolemia   . Hypertension   . Lymphedema of arm    left arm  . Type II or unspecified type diabetes mellitus without mention of complication, not stated as uncontrolled 03/25/2013    Surgeries: Procedure(s): TOTAL HIP ARTHROPLASTY ANTERIOR APPROACH on 01/05/2017   Consultants:   Discharged Condition: Improved  Hospital Course: Tami Schmidt is an 83 y.o. female who was admitted 01/05/2017 for operative treatment ofOsteoarthritis of right hip. Patient has severe unremitting pain that affects sleep, daily activities, and work/hobbies. After pre-op clearance the patient was taken to the operating room on 01/05/2017 and underwent  Procedure(s): TOTAL HIP ARTHROPLASTY ANTERIOR APPROACH.    Patient was given perioperative antibiotics: Anti-infectives    Start     Dose/Rate Route Frequency Ordered Stop   01/05/17 1230  ceFAZolin (ANCEF) IVPB 2g/100 mL premix     2 g 200 mL/hr over 30 Minutes Intravenous To ShortStay Surgical 01/02/17 0848 01/05/17 1225       Patient was given sequential compression devices, early ambulation, and chemoprophylaxis to prevent DVT.  Patient benefited maximally from hospital stay and there were no complications.    Recent vital signs: Patient Vitals for the past 24 hrs:  BP Temp Temp src Pulse Resp SpO2  01/08/17 0441 (!) 130/54 99.3 F (37.4 C) Oral 87 18 99  %  01/07/17 2145 - (!) 100.5 F (38.1 C) Oral - - -  01/07/17 1959 (!) 135/55 99.4 F (37.4 C) Oral 99 18 97 %  01/07/17 1241 119/60 98.6 F (37 C) Oral 91 17 98 %     Recent laboratory studies:  Recent Labs  01/07/17 0525 01/08/17 0633  WBC 11.0* 11.3*  HGB 9.6* 9.8*  HCT 29.5* 29.6*  PLT 155 135*     Discharge Medications:   Allergies as of 01/08/2017      Reactions   Bactrim Swelling   SWELLING OF MOUTH/FACE.   Lisinopril Swelling   SWELLING OF MOUTH/FACE.   Vasotec Swelling   SWELLING OF MOUTH/FACE.      Medication List    STOP taking these medications   HYDROcodone-acetaminophen 5-325 MG tablet Commonly known as:  NORCO/VICODIN   ibuprofen 600 MG tablet Commonly known as:  ADVIL,MOTRIN   meloxicam 15 MG tablet Commonly known as:  MOBIC     TAKE these medications   amLODipine 10 MG tablet Commonly known as:  NORVASC Take 10 mg by mouth daily with breakfast.   aspirin EC 325 MG tablet Take 1 tablet (325 mg total) by mouth 2 (two) times daily. What changed:  medication strength  how much to take  when to take this   fish oil-omega-3 fatty acids 1000 MG capsule Take 1 g by mouth daily with breakfast.   HUMULIN N 100 UNIT/ML injection Generic drug:  insulin NPH Human Inject 40 Units into the skin daily.  losartan-hydrochlorothiazide 100-25 MG tablet Commonly known as:  HYZAAR Take 1 tablet by mouth daily with breakfast.   metFORMIN 500 MG tablet Commonly known as:  GLUCOPHAGE Take 500 mg by mouth daily with breakfast.   oxyCODONE-acetaminophen 5-325 MG tablet Commonly known as:  ROXICET Take 1 tablet by mouth every 4 (four) hours as needed.   tamoxifen 20 MG tablet Commonly known as:  NOLVADEX Take 1 tablet (20 mg total) by mouth daily. What changed:  when to take this   tiZANidine 2 MG tablet Commonly known as:  ZANAFLEX Take 1 tablet (2 mg total) by mouth every 6 (six) hours as needed for muscle spasms.            Durable  Medical Equipment        Start     Ordered   01/05/17 2013  DME Walker rolling  Once    Question:  Patient needs a walker to treat with the following condition  Answer:  Status post right hip replacement   01/05/17 2013   01/05/17 2013  DME 3 n 1  Once     01/05/17 2013   01/05/17 2013  DME Bedside commode  Once    Question:  Patient needs a bedside commode to treat with the following condition  Answer:  Status post right hip replacement   01/05/17 2013       Discharge Care Instructions        Start     Ordered   01/08/17 0000  Call MD / Call 911    Comments:  If you experience chest pain or shortness of breath, CALL 911 and be transported to the hospital emergency room.  If you develope a fever above 101 F, pus (white drainage) or increased drainage or redness at the wound, or calf pain, call your surgeon's office.   01/08/17 0805   01/08/17 0000  Diet - low sodium heart healthy     01/08/17 0805   01/08/17 0000  Constipation Prevention    Comments:  Drink plenty of fluids.  Prune juice may be helpful.  You may use a stool softener, such as Colace (over the counter) 100 mg twice a day.  Use MiraLax (over the counter) for constipation as needed.   01/08/17 0805   01/08/17 0000  Increase activity slowly as tolerated     01/08/17 0805   01/08/17 0000  Patient may shower    Comments:  You may shower without a dressing once there is no drainage.  Do not wash over the wound.  If drainage remains, cover wound with plastic wrap and then shower.   01/08/17 0805   01/08/17 0000  Driving restrictions    Comments:  No driving for 2 weeks   01/08/17 0805   01/08/17 0000  Follow the hip precautions as taught in Physical Therapy     01/08/17 0805   01/05/17 0000  tiZANidine (ZANAFLEX) 2 MG tablet  Every 6 hours PRN     01/05/17 1439   01/05/17 0000  oxyCODONE-acetaminophen (ROXICET) 5-325 MG tablet  Every 4 hours PRN     01/05/17 1439   01/05/17 0000  aspirin EC 325 MG tablet  2 times  daily     01/05/17 1439      Diagnostic Studies: Dg Chest 2 View  Result Date: 12/26/2016 CLINICAL DATA:  83 year old female for preoperative evaluation. EXAM: CHEST  2 VIEW COMPARISON:  01/21/2013 FINDINGS: Please note evaluation is limited due to marked leftward rotation.  The cardiomediastinal silhouette is enlarged but grossly unchanged. Stable bronchiectatic changes. Bilateral lungs are otherwise clear. No pleural effusions or pneumothorax. Osseous structures grossly intact. IMPRESSION: Stable cardiomegaly and bronchiectatic changes without acute intrathoracic process. Electronically Signed   By: Kristopher Oppenheim M.D.   On: 12/26/2016 12:59   Dg C-arm 1-60 Min  Result Date: 01/05/2017 CLINICAL DATA:  Right anterior hip joint replacement. Reported fluoro time is 17 seconds EXAM: DG C-ARM 61-120 MIN; OPERATIVE RIGHT HIP WITH PELVIS COMPARISON:  None impacts FINDINGS: 2 fluoro spot radiographs reveal the patient to of undergone total hip joint prosthesis placement. Radiographic positioning of the prosthetic components is good. The native bone exhibits no acute abnormality. IMPRESSION: No immediate postprocedure complication following right total hip joint prosthesis placement. Electronically Signed   By: David  Martinique M.D.   On: 01/05/2017 14:02   Dg Hip Operative Unilat W Or W/o Pelvis Right  Result Date: 01/05/2017 CLINICAL DATA:  Right anterior hip joint replacement. Reported fluoro time is 17 seconds EXAM: DG C-ARM 61-120 MIN; OPERATIVE RIGHT HIP WITH PELVIS COMPARISON:  None impacts FINDINGS: 2 fluoro spot radiographs reveal the patient to of undergone total hip joint prosthesis placement. Radiographic positioning of the prosthetic components is good. The native bone exhibits no acute abnormality. IMPRESSION: No immediate postprocedure complication following right total hip joint prosthesis placement. Electronically Signed   By: David  Martinique M.D.   On: 01/05/2017 14:02    Disposition: 01-Home  or Self Care  Discharge Instructions    Call MD / Call 911    Complete by:  As directed    If you experience chest pain or shortness of breath, CALL 911 and be transported to the hospital emergency room.  If you develope a fever above 101 F, pus (white drainage) or increased drainage or redness at the wound, or calf pain, call your surgeon's office.   Constipation Prevention    Complete by:  As directed    Drink plenty of fluids.  Prune juice may be helpful.  You may use a stool softener, such as Colace (over the counter) 100 mg twice a day.  Use MiraLax (over the counter) for constipation as needed.   Diet - low sodium heart healthy    Complete by:  As directed    Driving restrictions    Complete by:  As directed    No driving for 2 weeks   Follow the hip precautions as taught in Physical Therapy    Complete by:  As directed    Increase activity slowly as tolerated    Complete by:  As directed    Patient may shower    Complete by:  As directed    You may shower without a dressing once there is no drainage.  Do not wash over the wound.  If drainage remains, cover wound with plastic wrap and then shower.      Follow-up Information    Frederik Pear, MD Follow up in 2 week(s).   Specialty:  Orthopedic Surgery Contact information: [place] [address] [phone]            Signed: Hardin Negus Patrice Matthew R 01/08/2017, 8:05 AM

## 2017-01-08 NOTE — Progress Notes (Signed)
PATIENT ID: Tami Schmidt  MRN: [id number]  DOB/AGE:  May 19, 1932 / 83 y.o.  3 Days Post-Op Procedure(s) (LRB): TOTAL HIP ARTHROPLASTY ANTERIOR APPROACH (Right)    PROGRESS NOTE Subjective: Patient is alert, oriented, no Nausea, no Vomiting, yes passing gas, . Taking PO well. Denies SOB, Chest or Calf Pain. Using Incentive Spirometer, PAS in place. Ambulate WBAT with pt walking 110 ft with therapy Patient reports pain as  Mild  .    Objective: Vital signs in last 24 hours: Vitals:   01/07/17 1241 01/07/17 1959 01/07/17 2145 01/08/17 0441  BP: 119/60 (!) 135/55  (!) 130/54  Pulse: 91 99  87  Resp: 17 18  18   Temp: 98.6 F (37 C) 99.4 F (37.4 C) (!) 100.5 F (38.1 C) 99.3 F (37.4 C)  TempSrc: Oral Oral Oral Oral  SpO2: 98% 97%  99%  Weight:          Intake/Output from previous day: I/O last 3 completed shifts: In: 3086.1 [P.O.:960; I.V.:2126.1] Out: -    Intake/Output this shift: No intake/output data recorded.   LABORATORY DATA:  Recent Labs  01/07/17 0525  01/07/17 1640 01/07/17 2130 01/08/17 0633 01/08/17 0640  WBC 11.0*  --   --   --  11.3*  --   HGB 9.6*  --   --   --  9.8*  --   HCT 29.5*  --   --   --  29.6*  --   PLT 155  --   --   --  135*  --   GLUCAP  --   < > 163* 151*  --  103*  < > = values in this interval not displayed.  Examination: Neurologically intact Neurovascular intact Sensation intact distally Intact pulses distally Dorsiflexion/Plantar flexion intact Incision: scant drainage No cellulitis present Compartment soft} XR AP&Lat of hip shows well placed\fixed THA  Assessment:   3 Days Post-Op Procedure(s) (LRB): TOTAL HIP ARTHROPLASTY ANTERIOR APPROACH (Right) ADDITIONAL DIAGNOSIS:  Expected Acute Blood Loss Anemia, Diabetes, Hypertension and breast cancer  Plan: PT/OT WBAT, THA  DVT Prophylaxis: SCDx72 hrs, ASA 325 mg BID x 2 weeks  DISCHARGE PLAN: Home  DISCHARGE NEEDS: HHPT, Walker and 3-in-1 comode seat

## 2017-01-08 NOTE — Progress Notes (Signed)
Physical Therapy Treatment Patient Details Name: Tami Schmidt MRN: [id number] DOB: 1932-10-09 Today's Date: 01/08/2017    History of Present Illness Pt is an 83 y.o. female now s/p R THA (direct anterior approach) on 01/05/17. Pertinent PMH includes DM II, arthritis, breast CA, HTN, peripheral neuropathy.     PT Comments    Pt performed increased gait and reviewed stair training this am.  Pt fatigued from increased activity and unable to review exercises in HEP or complete stair training.  Pt will need to climb 8 stairs before she reaches a landing in which she can take a seated restbreak.  Will return in an hour to perform stair training and review supine and seated exercises in HEP.  Informed nursing of need for additional PT session.     Follow Up Recommendations  DC plan and follow up therapy as arranged by surgeon;Supervision for mobility/OOB     Equipment Recommendations  None recommended by PT (DME delievered.  )    Recommendations for Other Services OT consult     Precautions / Restrictions Precautions Precautions: None Restrictions Weight Bearing Restrictions: Yes RLE Weight Bearing: Weight bearing as tolerated    Mobility  Bed Mobility               General bed mobility comments: Pt in recliner on arrival.    Transfers Overall transfer level: Needs assistance Equipment used: Rolling walker (2 wheeled) Transfers: Sit to/from Stand Sit to Stand: Supervision         General transfer comment: Pt required increased time, cues for hand placement to and from seated surface, cues for forward weight shifting.  Pt with poor eccentric loading as fatigue sets in.    Ambulation/Gait Ambulation/Gait assistance: Supervision Ambulation Distance (Feet): 180 Feet Assistive device: Rolling walker (2 wheeled) Gait Pattern/deviations: Step-through pattern;Antalgic;Decreased weight shift to right;Trunk flexed Gait velocity: Decreased   General Gait Details: Pt required  cues for forward gaze and gait symmetry.  Pt also required frequent standing rest breaks secondary to fatigue.  Cues for L foot clearance.     Stairs Stairs: Yes   Stair Management: One rail Right Number of Stairs: 3 General stair comments: Cues for sequencing and hand placement on R railing.  Pt fatigued and unable to climb more than three stairs.    Wheelchair Mobility    Modified Rankin (Stroke Patients Only)       Balance Overall balance assessment: Needs assistance   Sitting balance-Leahy Scale: Good       Standing balance-Leahy Scale: Fair                              Cognition Arousal/Alertness: Awake/alert Behavior During Therapy: WFL for tasks assessed/performed Overall Cognitive Status: Within Functional Limits for tasks assessed                                        Exercises      General Comments        Pertinent Vitals/Pain Pain Assessment: 0-10 Pain Score: 6  Pain Location: R hip with movement Pain Descriptors / Indicators: Aching;Operative site guarding;Discomfort Pain Intervention(s): Monitored during session;Repositioned;Ice applied    Home Living                      Prior Function  PT Goals (current goals can now be found in the care plan section) Acute Rehab PT Goals Patient Stated Goal: Go home later today Potential to Achieve Goals: Good Progress towards PT goals: Progressing toward goals    Frequency    7X/week      PT Plan Current plan remains appropriate    Co-evaluation              AM-PAC PT "6 Clicks" Daily Activity  Outcome Measure  Difficulty turning over in bed (including adjusting bedclothes, sheets and blankets)?: A Little Difficulty moving from lying on back to sitting on the side of the bed? : Unable Difficulty sitting down on and standing up from a chair with arms (e.g., wheelchair, bedside commode, etc,.)?: A Little Help needed moving to and from a  bed to chair (including a wheelchair)?: A Little Help needed walking in hospital room?: A Little Help needed climbing 3-5 steps with a railing? : A Little 6 Click Score: 16    End of Session Equipment Utilized During Treatment: Gait belt Activity Tolerance: Patient limited by fatigue Patient left: in bed;with call bell/phone within reach Nurse Communication: Mobility status PT Visit Diagnosis: Other abnormalities of gait and mobility (R26.89);Pain Pain - Right/Left: Right Pain - part of body: Hip     Time: 1102-1129 PT Time Calculation (min) (ACUTE ONLY): 27 min  Charges:  $Gait Training: 8-22 mins $Therapeutic Activity: 8-22 mins                    G Codes:       [name], PTA pager [phone]    [name] 01/08/2017, 1:10 PM

## 2017-01-08 NOTE — Care Management Note (Signed)
Case Management Note  Patient Details  Name: Tami Schmidt MRN: [id number] Date of Birth: February 22, 1933  Subjective/Objective:  83 yr old female s/p right total hip arthroplasty.                  Action/Plan: Case manager spoke with patient and husband to discuss discharge plan and DME. Choice for Home Health agency was offered. Referral was called to [place], [facility].  Patient will have family support at discharge. DME has been delivered to patient's room.  Expected Discharge Date:  01/08/17               Expected Discharge Plan:  [place]  In-House Referral:  NA  Discharge planning Services  CM Consult  Post Acute Care Choice:  Durable Medical Equipment, Home Health Choice offered to:  Patient, Spouse  DME Arranged:  Walker rolling DME Agency:  [employer]  HH Arranged:  PT [place]:  [place]  Status of Service:  Completed, signed off  If discussed at [place] of Stay Meetings, dates discussed:    Additional Comments:  [name], RN 01/08/2017, 2:34 PM

## 2017-01-08 NOTE — Evaluation (Signed)
Occupational Therapy Evaluation Patient Details Name: Tami Schmidt MRN: [id number] DOB: [birth year] Today's Date: 01/08/2017    History of Present Illness Pt is an 83 y.o. female now s/p R THA (direct anterior approach) on 01/05/17. Pertinent PMH includes DM II, arthritis, breast CA, HTN, peripheral neuropathy.    Clinical Impression   Pt admitted as above currently demonstrating deficits in her ability to perform ADL and self care tasks. She has DME at home and will have spouse assist 24/7 per her report. She was educated in LB ADL's, toilet & shower transfers (will sponge bathe initially however), grooming & UB dressing sitting to conserve energy and for safety. She may benefit from [facility] assessment for recommendations and home safety, however, currently denies need at this time. She plans to d/c home later today. Will sign off.    Follow Up Recommendations  No OT follow up;Supervision/Assistance - 24 hour (Pt denies further OT needs. States husband/fam can assist PRN. Pt may benefit from [facility] for recommendations. Currently denies need)    Equipment Recommendations  Other (comment);None recommended by OT (Pt has DME at home already per her report)    Recommendations for Other Services       Precautions / Restrictions Precautions Precautions: None Restrictions Weight Bearing Restrictions: Yes RLE Weight Bearing: Weight bearing as tolerated      Mobility Bed Mobility Overal bed mobility: Needs Assistance Bed Mobility: Rolling;Sidelying to Sit Rolling: Min guard (Increased time; rolling onto non-operated side) Sidelying to sit: Min assist (Increased time and verbal cues for technique. HOB flat and grabbed onto edge of mattress.)       General bed mobility comments: Pt able to perform rolling and sidelying to sit (onto non-operated side) with Min assist, HOB flat, grabbing onto edge of mattress. Increased time and VC's for technique - moves slowly.  Transfers Overall  transfer level: Needs assistance Equipment used: Rolling walker (2 wheeled) Transfers: Sit to/from [facility] Sit to Stand: Supervision (Increased time) Stand pivot transfers: Supervision       General transfer comment: Increased time to position RLE for standing, but pt able to do this with no physical assist. Supervision/guard for safety    Balance Overall balance assessment: Needs assistance Sitting-balance support: Bilateral upper extremity supported;Feet supported Sitting balance-Leahy Scale: Good     Standing balance support: Bilateral upper extremity supported;No upper extremity supported;During functional activity Standing balance-Leahy Scale: Fair Standing balance comment: Albe to stand at sink during ADL's at supervision/guard level - no physical assist.                           ADL either performed or assessed with clinical judgement   ADL Overall ADL's : Needs assistance/impaired Eating/Feeding: Independent;Sitting;Set up   Grooming: Wash/dry hands;Wash/dry face;Oral care;Supervision/safety;Standing Grooming Details (indicate cue type and reason): Standing at sink during grooming tasks Upper Body Bathing: Set up;Sitting   Lower Body Bathing: Sit to/from stand;Sitting/lateral leans;Min guard   Upper Body Dressing : Set up;Sitting;Independent   Lower Body Dressing: Sit to/from stand;Minimal assistance   Toilet Transfer: Supervision/safety;BSC;Ambulation;RW Toilet Transfer Details (indicate cue type and reason): 3:1 over toilet in rest room. Increased time but no physical assist. Supervision-guard assist Toileting- Clothing Manipulation and Hygiene: Sit to/from stand;Supervision/safety;Min guard     Tub/Shower Transfer Details (indicate cue type and reason): Pt has walk in shower at home. Discussed/demonstrated shower transfers, handout issued and reviewed. Pt reports that she will sponge bathe until she is  ready to step into showet &  husband can assist PRN per her report. Pt also plans to do "dry run" with therapist that comes out to her house. Functional mobility during ADLs: Supervision/safety;Rolling walker;Cueing for sequencing (Supervision-close guard but no physical assist) General ADL Comments: Pt was educated in role of OT and assessment was performed followed by ADL retraining session to include bed mobility, toileting, grooming standing at sink, discussion of shower transfers and LB ADL's. Pt plans to d/c home later today with 24/7 husband assist PRN. She plans to sponge bathe initially and denies any further acute OT needs at this time.     Vision Baseline Vision/History: Wears glasses Wears Glasses: At all times Patient Visual Report: No change from baseline       Perception     Praxis      Pertinent Vitals/Pain Pain Assessment: No/denies pain Pain Score: 0-No pain Faces Pain Scale: No hurt     Hand Dominance Right   Extremity/Trunk Assessment Upper Extremity Assessment Upper Extremity Assessment: Overall WFL for tasks assessed;Generalized weakness;RUE deficits/detail RUE Deficits / Details: Bone spur in right shoulder   Lower Extremity Assessment Lower Extremity Assessment: Defer to PT evaluation       Communication Communication Communication: No difficulties   Cognition Arousal/Alertness: Awake/alert Behavior During Therapy: WFL for tasks assessed/performed Overall Cognitive Status: Within Functional Limits for tasks assessed                                     General Comments       Exercises     Shoulder Instructions      Home Living Family/patient expects to be discharged to:: Private residence Living Arrangements: Spouse/significant other Available Help at Discharge: Family;Available 24 hours/day Type of Home: House Home Access: Stairs to enter Entrance Stairs-Number of Steps: 1 (1 STE)   Home Layout: Two level;Full bath on main level;Bed/bath  upstairs Alternate Level Stairs-Number of Steps: 16 Alternate Level Stairs-Rails: Right Bathroom Shower/Tub: Occupational psychologist: Standard     Home Equipment: Cane - single point;Bedside commode;Grab bars - tub/shower;Hand held shower head;Adaptive equipment Adaptive Equipment: Reacher        Prior Functioning/Environment Level of Independence: Independent with assistive device(s)        Comments: Mod indep with SPC past ~1 week secondary to R hip pain; husband drives pt        OT Problem List: Pain;Decreased knowledge of use of DME or AE      OT Treatment/Interventions:      OT Goals(Current goals can be found in the care plan section) Acute Rehab OT Goals Patient Stated Goal: Go home later today OT Goal Formulation: All assessment and education complete, DC therapy  OT Frequency:     Barriers to D/C:            Co-evaluation              AM-PAC PT "6 Clicks" Daily Activity     Outcome Measure Help from another person eating meals?: None Help from another person taking care of personal grooming?: A Little Help from another person toileting, which includes using toliet, bedpan, or urinal?: None Help from another person bathing (including washing, rinsing, drying)?: A Little Help from another person to put on and taking off regular upper body clothing?: None Help from another person to put on and taking off regular lower body clothing?:  A Little 6 Click Score: 21   End of Session Equipment Utilized During Treatment: Gait belt;Rolling walker Nurse Communication: Other (comment);Mobility status (Grooming and toileting completed, pt up in chair eating breakfast)  Activity Tolerance: Patient tolerated treatment well Patient left: in chair;with call bell/phone within reach;with nursing/sitter in room  OT Visit Diagnosis: Pain Pain - Right/Left: Right Pain - part of body: Hip                Time: 2233-6122 OT Time Calculation (min): 37  min Charges:  OT General Charges $OT Visit: 1 Visit OT Evaluation $OT Eval Moderate Complexity: 1 Mod OT Treatments $Self Care/Home Management : 8-22 mins G-Codes:      Josephine Igo Dixon, OTR/L 01/08/2017, 9:01 AM

## 2017-01-19 ENCOUNTER — Other Ambulatory Visit: Payer: Medicare Other

## 2017-01-20 ENCOUNTER — Other Ambulatory Visit (HOSPITAL_BASED_OUTPATIENT_CLINIC_OR_DEPARTMENT_OTHER): Payer: Medicare Other

## 2017-01-20 ENCOUNTER — Encounter (HOSPITAL_COMMUNITY): Payer: Self-pay

## 2017-01-20 ENCOUNTER — Ambulatory Visit (HOSPITAL_COMMUNITY)
Admission: RE | Admit: 2017-01-20 | Discharge: 2017-01-20 | Disposition: A | Discharge status: Home / Self Care | Payer: Medicare Other | Source: Ambulatory Visit | Attending: Oncology | Admitting: Oncology

## 2017-01-20 DIAGNOSIS — Z170 Estrogen receptor positive status [ER+]: Secondary | ICD-10-CM | POA: Diagnosis not present

## 2017-01-20 DIAGNOSIS — C50312 Malignant neoplasm of lower-inner quadrant of left female breast: Secondary | ICD-10-CM | POA: Insufficient documentation

## 2017-01-20 DIAGNOSIS — C7989 Secondary malignant neoplasm of other specified sites: Secondary | ICD-10-CM | POA: Insufficient documentation

## 2017-01-20 DIAGNOSIS — I700 Atherosclerosis of aorta: Secondary | ICD-10-CM | POA: Insufficient documentation

## 2017-01-20 DIAGNOSIS — C50912 Malignant neoplasm of unspecified site of left female breast: Secondary | ICD-10-CM

## 2017-01-20 DIAGNOSIS — I2510 Atherosclerotic heart disease of native coronary artery without angina pectoris: Secondary | ICD-10-CM | POA: Insufficient documentation

## 2017-01-20 LAB — CBC WITH DIFFERENTIAL/PLATELET
BASO%: 1.2 % (ref 0.0–2.0)
BASOS ABS: 0.1 10*3/uL (ref 0.0–0.1)
EOS ABS: 0.1 10*3/uL (ref 0.0–0.5)
EOS%: 2 % (ref 0.0–7.0)
HCT: 32.8 % — ABNORMAL LOW (ref 34.8–46.6)
HGB: 10.5 g/dL — ABNORMAL LOW (ref 11.6–15.9)
LYMPH%: 20.2 % (ref 14.0–49.7)
MCH: 24.9 pg — AB (ref 25.1–34.0)
MCHC: 32 g/dL (ref 31.5–36.0)
MCV: 77.8 fL — AB (ref 79.5–101.0)
MONO#: 0.4 10*3/uL (ref 0.1–0.9)
MONO%: 5.7 % (ref 0.0–14.0)
NEUT#: 4.5 10*3/uL (ref 1.5–6.5)
NEUT%: 70.9 % (ref 38.4–76.8)
Platelets: 368 10*3/uL (ref 145–400)
RBC: 4.22 10*6/uL (ref 3.70–5.45)
RDW: 16 % — ABNORMAL HIGH (ref 11.2–14.5)
WBC: 6.4 10*3/uL (ref 3.9–10.3)
lymph#: 1.3 10*3/uL (ref 0.9–3.3)

## 2017-01-20 LAB — COMPREHENSIVE METABOLIC PANEL
ALBUMIN: 3.4 g/dL — AB (ref 3.5–5.0)
ALT: 14 U/L (ref 0–55)
AST: 10 U/L (ref 5–34)
Alkaline Phosphatase: 69 U/L (ref 40–150)
Anion Gap: 10 mEq/L (ref 3–11)
BILIRUBIN TOTAL: 0.43 mg/dL (ref 0.20–1.20)
BUN: 15.6 mg/dL (ref 7.0–26.0)
CALCIUM: 9.3 mg/dL (ref 8.4–10.4)
CHLORIDE: 104 meq/L (ref 98–109)
CO2: 25 mEq/L (ref 22–29)
CREATININE: 0.7 mg/dL (ref 0.6–1.1)
EGFR: 89 mL/min/{1.73_m2} — ABNORMAL LOW (ref >90)
Glucose: 135 mg/dl (ref 70–140)
Potassium: 3.9 mEq/L (ref 3.5–5.1)
Sodium: 139 mEq/L (ref 136–145)
TOTAL PROTEIN: 7.1 g/dL (ref 6.4–8.3)

## 2017-01-20 MED ORDER — IOPAMIDOL (ISOVUE-300) INJECTION 61%
75.0000 mL | Freq: Once | INTRAVENOUS | Status: AC | PRN
  Administered 2017-01-20: 75 mL via INTRAVENOUS

## 2017-01-20 MED ORDER — IOPAMIDOL (ISOVUE-300) INJECTION 61%
INTRAVENOUS | Status: AC
  Filled 2017-01-20: qty 75

## 2017-01-20 NOTE — Progress Notes (Signed)
PT seen for IV contrast extravasation during CT today. IV in (L)antecubital fossa. Tech reports approx 20mL contrast extrav. IV removed Protocol initiated. Pt arm elevated and ice pack applied.  Minimal focal edema, no skin changes. Non tender. Instructions provided to pt.  Ascencion Dike PA-C Interventional Radiology 01/20/2017 1:36 PM

## 2017-01-26 ENCOUNTER — Ambulatory Visit: Payer: Medicare Other | Admitting: Oncology

## 2017-01-27 ENCOUNTER — Other Ambulatory Visit: Payer: Self-pay | Admitting: Oncology

## 2017-01-27 ENCOUNTER — Encounter: Payer: Self-pay | Admitting: Oncology

## 2017-01-27 ENCOUNTER — Ambulatory Visit: Payer: Medicare Other | Admitting: Oncology

## 2017-01-27 NOTE — Progress Notes (Signed)
No show

## 2017-02-05 ENCOUNTER — Telehealth: Payer: Self-pay | Admitting: Oncology

## 2017-02-05 NOTE — Telephone Encounter (Signed)
Left message for patient regarding upcoming October appointments.  °

## 2017-02-08 NOTE — Progress Notes (Signed)
ID: [name] OB: [birth year]  MR#: [id number]  CSN#:[id number]  PCP: Donald Prose, MD GYN:   SU: Coralie Keens, MD OTHER MD: Kyung Rudd, MD  CHIEF COMPLAINT:  1)  Hx Right Breast Cancer    2)  Hx Left Breast Cancer, with recurrence and metastatic adenopathy  CURRENT TREATMENT: Tamoxifen, [Denosumab/prolia]  BREAST CANCER HISTORY: From the earlier summary:  Tami Schmidt's history of breast cancer dates back to 1993, when she underwent biopsy of her right breast in [place]. I do not have those details, but she moved to [place] shortly thereafter and it was in [place] where she had her right lumpectomy and axillary lymph node dissection. She had a total of 6 lymph nodes removed, she tells me, and all of them were clear. She does not recall the size of the primary. She was treated with adjuvant radiation and then received tamoxifen between 1993 and 1998.  In 2002 she had a local recurrence in the right breast, and underwent right mastectomy. She received tamoxifen between 2002 and 2007.  In 2004 she underwent a left mastectomy and sentinel lymph node sampling for a 9 mm mucinous carcinoma, grade 2, multifocal, associated with ductal carcinoma in situ. The single sentinel lymph node was negative. Margins were ample. She did not receive adjuvant radiation, but continued on tamoxifen as noted above, until 2007.  More recently, in July of 2014, the patient noted a "pimple" in the left chest wall, superior to her prior mastectomy incision. She brought this to the attention of her surgeon, Dr. Ninfa Linden and he describes a dimple area with a firm mass underneath. He also describes a large lipoma in the left clavicular area and a second one on her abdominal wall. Both of these were symptomatic. All 3 masses were removed 01/26/2013. The pathology from this procedure ([id number]) showed, in the chest wall, and invasive ductal carcinoma measuring grossly 1.3 cm. The lesion abutted the closest inked resection  margin. The other 2 masses proved to be lipomas.  The patient's subsequent history is as detailed below  INTERVAL HISTORY: Tami Schmidt returns today for follow-up and treatment of her recurrent estrogen receptor positive breast cancer. She continues on tamoxifen, with good tolerance. She denies hot flashes or increase in vaginal discharge.   Since her last visit to the office, she has had a CT Chest completed on 01/20/2017 with results showing: Stable since prior study and also comparing back to 07/17/2015. No change 15 x 26 mm left subpectoral soft tissue nodule. Small left axillary lymph nodes are also stable.  REVIEW OF SYSTEMS: Tessi reports that she walks up and down her stairs as her form of exercise. She also is able to complete her daily activities without many issues. She has had a right hip replacement under Dr. Mayer Camel on 01/05/2017. She has completed physical therapy and continues at home exercises. Since the surgery, she is now able to bend a little better. She has residual soreness to her right hip that she treats with 600 mg ibuprofen every 6 hours. She denies unusual headaches, visual changes, nausea, vomiting, or dizziness. There has been no unusual cough, phlegm production, or pleurisy. This been no change in bowel or bladder habits. She denies unexplained fatigue or unexplained weight loss, bleeding, rash, or fever. A detailed review of systems was otherwise stable.    PAST MEDICAL HISTORY: Past Medical History:  Diagnosis Date  . Arthritis   . Breast cancer ([facility])    b/l mastectomies hx  . Cancer ([facility])  breast  . 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 Diabetes mellitus    fasting 90-100  . HTN (hypertension) 03/25/2013  . Hx of radiation therapy    breasts hx  . Hypercholesterolemia   . Hypertension   . Lymphedema of arm    left arm  . Type II or unspecified type diabetes mellitus without mention of complication, not stated as uncontrolled 03/25/2013     PAST SURGICAL HISTORY: Past Surgical History:  Procedure Laterality Date  . ABDOMINAL HYSTERECTOMY    . ANKLE SURGERY Right 1995  . APPENDECTOMY    . BREAST SURGERY Bilateral    mastectomy  . HERNIA REPAIR  04-26-2010  . MASS EXCISION Left 01/26/2013   Procedure: EXCISION LEFT CHEST WALL MASS AND LEFT ABDOMNAL WALL MASS;  Surgeon: Harl Bowie, MD;  Location: [facility];  Service: General;  Laterality: Left;  Marland Kitchen MASS EXCISION Left 09/20/2014   Procedure: EXCISION OF LEFT CHEST WALL MASS;  Surgeon: Coralie Keens, MD;  Location: [facility];  Service: General;  Laterality: Left;  . TOTAL HIP ARTHROPLASTY Right 01/05/2017  . TOTAL HIP ARTHROPLASTY Right 01/05/2017   Procedure: TOTAL HIP ARTHROPLASTY ANTERIOR APPROACH;  Surgeon: Frederik Pear, MD;  Location: [facility];  Service: Orthopedics;  Laterality: Right;    FAMILY HISTORY The patient's father died from a heart attack at the age of 34. The patient's mother died at the age of 19 from myocarditis. The patient had no brothers, one sister. There is no history of breast or ovarian cancer in the family to her knowledge  GYNECOLOGIC HISTORY:   (Reviewed 09/22/2013) Menarche age 40, first live birth age 44, the patient is [place] P5. She underwent hysterectomy in 1984. She did not use hormone replacement.  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 Her husband Radiographer, therapeutic used to work in Radio [employer] but is now retired. Their children are: Tami Schmidt who lives in [place] and teaches theater there; Tami Schmidt who works as a Social worker in [place]; Tami Schmidt who lives in [place] and is a "radio personality"; Tami Schmidt, who is a Biomedical scientist in [place], and Tami Schmidt who is a musician living in [place]. The patient has 2 grandchildren. She attends a [employer]    ADVANCED DIRECTIVES: Not in place   HEALTH MAINTENANCE:  (Updated 09/22/2013) Social History  Substance Use Topics  . Smoking status:  Former Smoker    Types: Cigarettes    Quit date: 04/14/1972  . Smokeless tobacco: Never Used  . Alcohol use Yes     Comment: occasional    Colonoscopy: 2011  PAP: Status post hysterectomy  Bone density: 1993 ; repeat 12/27/2013 showed osteopenia with a T score of -1.8  Lipid panel: not on file  Allergies  Allergen Reactions  . Bactrim Swelling    SWELLING OF MOUTH/FACE.  Marland Kitchen Lisinopril Swelling    SWELLING OF MOUTH/FACE.  Marland Kitchen Vasotec Swelling    SWELLING OF MOUTH/FACE.    Current Outpatient Prescriptions  Medication Sig Dispense Refill  . amLODipine (NORVASC) 10 MG tablet Take 10 mg by mouth daily with breakfast.     . aspirin EC 325 MG tablet Take 1 tablet (325 mg total) by mouth 2 (two) times daily. 30 tablet 0  . fish oil-omega-3 fatty acids 1000 MG capsule Take 1 g by mouth daily with breakfast.     . HUMULIN N 100 UNIT/ML injection Inject 40 Units into the skin daily.    Marland Kitchen  losartan-hydrochlorothiazide (HYZAAR) 100-25 MG per tablet Take 1 tablet by mouth daily with breakfast.     . metFORMIN (GLUCOPHAGE) 500 MG tablet Take 500 mg by mouth daily with breakfast.    . oxyCODONE-acetaminophen (ROXICET) 5-325 MG tablet Take 1 tablet by mouth every 4 (four) hours as needed. 30 tablet 0  . tamoxifen (NOLVADEX) 20 MG tablet Take 1 tablet (20 mg total) by mouth daily. (Patient taking differently: Take 20 mg by mouth daily with breakfast. ) 90 tablet 0  . tiZANidine (ZANAFLEX) 2 MG tablet Take 1 tablet (2 mg total) by mouth every 6 (six) hours as needed for muscle spasms. 60 tablet 0   No current facility-administered medications for this visit.     OBJECTIVE: Older African American woman who appears stated age 84:   02/09/17 0936  BP: (!) 150/68  Pulse: 89  Resp: 19  Temp: 97.9 F (36.6 C)  SpO2: 100%     Body mass index is 33.88 kg/m.    ECOG FS: 2 Filed Weights   02/09/17 0936  Weight: 209 lb 14.4 oz (95.2 kg)    Sclerae muddy but not unicteric, EOMs intact No  cervical or supraclavicular adenopathy Lungs no rales or rhonchi Heart regular rate and rhythm Abd soft, obese, nontender, positive bowel sounds MSK no focal spinal tenderness, left grade 1 upper extremity lymphedema Neuro: nonfocal, well oriented, appropriate affect Breasts: She is status post bilateral mastectomies, with no evidence of chest wall recurrence.  Both axillae are benign.   LAB RESULTS:  Lab Results  Component Value Date   WBC 6.4 01/20/2017   NEUTROABS 4.5 01/20/2017   HGB 10.5 (L) 01/20/2017   HCT 32.8 (L) 01/20/2017   MCV 77.8 (L) 01/20/2017   PLT 368 01/20/2017      Chemistry      Component Value Date/Time   NA 139 01/20/2017 1057   K 3.9 01/20/2017 1057   CL 105 12/26/2016 1222   CO2 25 01/20/2017 1057   BUN 15.6 01/20/2017 1057   CREATININE 0.7 01/20/2017 1057      Component Value Date/Time   CALCIUM 9.3 01/20/2017 1057   ALKPHOS 69 01/20/2017 1057   AST 10 01/20/2017 1057   ALT 14 01/20/2017 1057   BILITOT 0.43 01/20/2017 1057      STUDIES: Ct Chest W Contrast  Result Date: 01/20/2017 CLINICAL DATA:  Bilateral breast cancer. More than 3 years out from biopsy-proven local regional recurrence of her breast cancer, with no evidence of disease activity. EXAM: CT CHEST WITH CONTRAST TECHNIQUE: Multidetector CT imaging of the chest was performed during intravenous contrast administration. CONTRAST:  48m ISOVUE-300 IOPAMIDOL (ISOVUE-300) INJECTION 61% COMPARISON:  03/18/2016 FINDINGS: Cardiovascular: The cardio pericardial silhouette is enlarged. Coronary artery calcification is evident. Atherosclerotic calcification is noted in the wall of the thoracic aorta. Mediastinum/Nodes: Similar appearance of left thyroid nodules. No mediastinal lymphadenopathy. There is no hilar lymphadenopathy. Tiny hiatal hernia. Esophagus unremarkable. 15 x 26 mm left subpectoral soft tissue nodule is unchanged since prior study and comparing back to 07/17/2015. Small lymph nodes  in the left axilla are also unchanged. 11 mm lymph node measured on 07/17/2015 is seen on image 33 today measures 8 mm. Stranding in the fat of the left axilla is also unchanged. Lungs/Pleura: No suspicious pulmonary nodule or mass in either lung. Subpleural reticulation anterior left upper lobe compatible with radiation fibrosis. No pulmonary edema or pleural effusion. Upper Abdomen: Unremarkable. Musculoskeletal: Bone windows reveal no worrisome lytic or sclerotic osseous lesions.  IMPRESSION: 1. Stable since prior study and also comparing back to 07/17/2015. 2. No change 15 x 26 mm left subpectoral soft tissue nodule. Continued attention on follow-up recommended. Small left axillary lymph nodes are also stable. 3. Coronary artery and Aortic Atherosclerois (ICD10-170.0) Electronically Signed   By: Misty Stanley M.D.   On: 01/20/2017 16:29     ASSESSMENT: 83 y.o. [place] woman  (1) status post right lumpectomy and axillary dissection in 1993 in [place] for what appears to have been a stage I breast cancer, treated with radiation and then tamoxifen for 5 years  (2) status post right mastectomy 2002 for a recurrence in the right breast, treated adjuvantly with tamoxifen for an additional 5 years  (3) status post left mastectomy and sentinel lymph node sampling 03/07/2003 for a lower inner quadrant pT1b, pN0, stage IA mucinous breast cancer, grade 2; no radiation therapy was given. Tamoxifen was continued until 2009  RECURRENT DISEASE: OCTOBER 2014 (4) excisional biopsy of a left chest wall mass 01/26/2013 showing carcinoma consistent with invasive ductal carcinoma, estrogen receptor 100% positive, progesterone receptor 0% positive, with an MIB-1 of 22% and no HER-2 amplification.   (5)  PET scan on 03/03/2013 confirmed extensive metastatic adenopathy in the left axilla, left subpectoral musculature, and left supraclavicular region  (6)  started on anastrozole daily beginning 03/01/2013, interrupted  during radiation therapy, resumed March 2015, discontinued March 2016 because of fatigue and arthralgias  (7) radiation therapy 03/31/2013 through 05/23/2013 Site/dose:   The patient was treated initially with a forward treatment planning technique to the left chest wall in addition to treatment to the supraclavicular region. This consisted of a 3-D conformal technique. The patient was treated in this fashion to a dose of 50.4 gray. The patient then received a 14 gray boost treatment using an electron field. The total dose was 64.4 gray.  (8) Osteopenia, zometa yearly started 07/27/2014, but poorly tolerated.   (a) bone density scan on 12/27/13 showed a t-score of -1.8   (b) started Denosumab/Prolia April 2017, canceled after one dose per patient  (9) tamoxifen started 07/27/2014  PLAN:  Yisell is now 4 years out from biopsy-proven local recurrence of her disease.  There is no evidence of progression.  CT does show a 2.6 cm subpectoral nodule that requires further follow-up.  This has been stable however over more than 2 scans  The plan is to continue tamoxifen fairly indefinitely.  She is going to return to see me in 1 year.  She will have lab work and a repeat CT scan prior to that visit.  She knows to call for any other issues that may develop before then.  Pacey Altizer, Virgie Dad, MD  02/09/17 9:46 AM Medical Oncology and Hematology [facility] [address] [place], [place] [address] Tel. [phone]    Fax. [phone]   This document serves as a record of services personally performed by Lurline Del, MD. It was created on her behalf by Steva Colder, a trained medical scribe. The creation of this record is based on the scribe's personal observations and the provider's statements to them. This document has been checked and approved by the attending provider.

## 2017-02-09 ENCOUNTER — Telehealth: Payer: Self-pay | Admitting: Oncology

## 2017-02-09 ENCOUNTER — Other Ambulatory Visit: Payer: Medicare Other

## 2017-02-09 ENCOUNTER — Ambulatory Visit (HOSPITAL_BASED_OUTPATIENT_CLINIC_OR_DEPARTMENT_OTHER): Payer: Medicare Other | Admitting: Oncology

## 2017-02-09 VITALS — BP 150/68 | HR 89 | Temp 97.9°F | Resp 19 | Ht 66.0 in | Wt 209.9 lb

## 2017-02-09 DIAGNOSIS — Z79810 Long term (current) use of selective estrogen receptor modulators (SERMs): Secondary | ICD-10-CM

## 2017-02-09 DIAGNOSIS — C50312 Malignant neoplasm of lower-inner quadrant of left female breast: Secondary | ICD-10-CM | POA: Diagnosis not present

## 2017-02-09 DIAGNOSIS — Z170 Estrogen receptor positive status [ER+]: Secondary | ICD-10-CM | POA: Diagnosis not present

## 2017-02-09 DIAGNOSIS — C7989 Secondary malignant neoplasm of other specified sites: Secondary | ICD-10-CM | POA: Diagnosis not present

## 2017-02-09 DIAGNOSIS — M8580 Other specified disorders of bone density and structure, unspecified site: Secondary | ICD-10-CM

## 2017-02-09 DIAGNOSIS — C50912 Malignant neoplasm of unspecified site of left female breast: Secondary | ICD-10-CM

## 2017-02-09 NOTE — Telephone Encounter (Signed)
Scheduled appts per 10/29 los. Patient did not want avs or calendar.

## 2017-02-19 ENCOUNTER — Other Ambulatory Visit: Payer: Self-pay | Admitting: Nurse Practitioner

## 2017-02-21 ENCOUNTER — Other Ambulatory Visit: Payer: Self-pay | Admitting: Nurse Practitioner

## 2017-03-17 ENCOUNTER — Other Ambulatory Visit: Payer: Self-pay

## 2017-03-17 DIAGNOSIS — C50312 Malignant neoplasm of lower-inner quadrant of left female breast: Secondary | ICD-10-CM

## 2017-03-17 MED ORDER — TAMOXIFEN CITRATE 20 MG PO TABS: 20.0000 mg | ORAL_TABLET | Freq: Every day | ORAL | 0 refills | Status: DC

## 2017-06-29 ENCOUNTER — Other Ambulatory Visit: Payer: Self-pay | Admitting: *Deleted

## 2017-06-29 DIAGNOSIS — C50312 Malignant neoplasm of lower-inner quadrant of left female breast: Secondary | ICD-10-CM

## 2017-06-29 MED ORDER — TAMOXIFEN CITRATE 20 MG PO TABS: 20.0000 mg | ORAL_TABLET | Freq: Every day | ORAL | 0 refills | Status: DC

## 2017-11-04 ENCOUNTER — Encounter (HOSPITAL_COMMUNITY): Payer: Self-pay

## 2017-11-04 ENCOUNTER — Ambulatory Visit (HOSPITAL_COMMUNITY)
Admission: EM | Admit: 2017-11-04 | Discharge: 2017-11-04 | Disposition: A | Discharge status: Home / Self Care | Payer: Medicare Other | Attending: Family Medicine | Admitting: Family Medicine

## 2017-11-04 DIAGNOSIS — B029 Zoster without complications: Secondary | ICD-10-CM | POA: Diagnosis not present

## 2017-11-04 MED ORDER — VALACYCLOVIR HCL 1 G PO TABS: 1000.0000 mg | ORAL_TABLET | Freq: Three times a day (TID) | ORAL | 0 refills | Status: AC

## 2017-11-04 MED ORDER — LIDOCAINE 1.8 % EX PTCH: 1.0000 | MEDICATED_PATCH | Freq: Every day | CUTANEOUS | 0 refills | Status: DC

## 2017-11-04 NOTE — ED Triage Notes (Signed)
Pt presents with right flank pain

## 2017-11-04 NOTE — ED Provider Notes (Addendum)
[facility]   [id number] 11/04/17 Arrival Time: 1705  SUBJECTIVE:  SIGRID SCHWEBACH is a 84 y.o. female who presents with complaint of right flank pain that began 3 days ago.  Denies a precipitating event, or trauma.  Localizes pain to right mid back and radiates towards RLQ of abdomen.  Describes as constant, sharp, and throbbing in character.  Has tried OTC medications including ibuprofen without relief.  Denies alleviating or aggravating factors.  Denies similar symptoms in the past.  Last BM Sunday and normal for patient.    Denies fever, chills, appetite changes, weight changes, nausea, vomiting, chest pain, SOB, diarrhea, constipation, hematochezia, melena, dysuria, difficulty urinating, increased frequency or urgency, loss of bowel or bladder function.  No LMP recorded. Patient has had a hysterectomy.  ROS: As per HPI.  Past Medical History:  Diagnosis Date  . Arthritis   . Breast cancer ([facility])    b/l mastectomies hx  . Cancer [facility])    breast  . 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 Diabetes mellitus    fasting 90-100  . HTN (hypertension) 03/25/2013  . Hx of radiation therapy    breasts hx  . Hypercholesterolemia   . Hypertension   . Lymphedema of arm    left arm  . Type II or unspecified type diabetes mellitus without mention of complication, not stated as uncontrolled 03/25/2013   Past Surgical History:  Procedure Laterality Date  . ABDOMINAL HYSTERECTOMY    . ANKLE SURGERY Right 1995  . APPENDECTOMY    . BREAST SURGERY Bilateral    mastectomy  . HERNIA REPAIR  04-26-2010  . MASS EXCISION Left 01/26/2013   Procedure: EXCISION LEFT CHEST WALL MASS AND LEFT ABDOMNAL WALL MASS;  Surgeon: Harl Bowie, MD;  Location: [facility];  Service: General;  Laterality: Left;  Marland Kitchen MASS EXCISION Left 09/20/2014   Procedure: EXCISION OF LEFT CHEST WALL MASS;  Surgeon: Coralie Keens, MD;  Location: [facility];  Service: General;   Laterality: Left;  . TOTAL HIP ARTHROPLASTY Right 01/05/2017  . TOTAL HIP ARTHROPLASTY Right 01/05/2017   Procedure: TOTAL HIP ARTHROPLASTY ANTERIOR APPROACH;  Surgeon: Frederik Pear, MD;  Location: [facility];  Service: Orthopedics;  Laterality: Right;   Allergies  Allergen Reactions  . Bactrim Swelling    SWELLING OF MOUTH/FACE.  Marland Kitchen Lisinopril Swelling    SWELLING OF MOUTH/FACE.  Marland Kitchen Vasotec Swelling    SWELLING OF MOUTH/FACE.   No current facility-administered medications on file prior to encounter.    Current Outpatient Medications on File Prior to Encounter  Medication Sig Dispense Refill  . amLODipine (NORVASC) 10 MG tablet Take 10 mg by mouth daily with breakfast.     . aspirin EC 325 MG tablet Take 1 tablet (325 mg total) by mouth 2 (two) times daily. 30 tablet 0  . fish oil-omega-3 fatty acids 1000 MG capsule Take 1 g by mouth daily with breakfast.     . HUMULIN N 100 UNIT/ML injection Inject 40 Units into the skin daily.    Marland Kitchen losartan-hydrochlorothiazide (HYZAAR) 100-25 MG per tablet Take 1 tablet by mouth daily with breakfast.     . metFORMIN (GLUCOPHAGE) 500 MG tablet Take 500 mg by mouth daily with breakfast.    . tamoxifen (NOLVADEX) 20 MG tablet Take 1 tablet (20 mg total) by mouth daily. 90 tablet 0   Social History   Socioeconomic History  . Marital status: Married    Spouse name: Not on file  .  Number of children: Not on file  . Years of education: Not on file  . Highest education level: Not on file  Occupational History  . Occupation: retired Education officer, museum  Social Needs  . Financial resource strain: Not on file  . Food insecurity:    Worry: Not on file    Inability: Not on file  . Transportation needs:    Medical: Not on file    Non-medical: Not on file  Tobacco Use  . Smoking status: Former Smoker    Types: Cigarettes    Last attempt to quit: 04/14/1972    Years since quitting: 45.5  . Smokeless tobacco: Never Used  Substance and Sexual Activity  . Alcohol  use: Yes    Comment: occasional  . Drug use: No  . Sexual activity: Yes    Birth control/protection: Surgical  Lifestyle  . Physical activity:    Days per week: Not on file    Minutes per session: Not on file  . Stress: Not on file  Relationships  . Social connections:    Talks on phone: Not on file    Gets together: Not on file    Attends religious service: Not on file    Active member of club or organization: Not on file    Attends meetings of clubs or organizations: Not on file    Relationship status: Not on file  . Intimate partner violence:    Fear of current or ex partner: Not on file    Emotionally abused: Not on file    Physically abused: Not on file    Forced sexual activity: Not on file  Other Topics Concern  . Not on file  Social History Narrative  . Not on file   History reviewed. No pertinent family history.   OBJECTIVE:  Vitals:   11/04/17 1729  BP: (!) 161/71  Pulse: 85  Resp: 16  Temp: 98.5 F (36.9 C)  TempSrc: Oral  SpO2: 95%    General appearance: AOx3 in no acute distress HEENT: NCAT.  Oropharynx clear.  Lungs: clear to auscultation bilaterally without adventitious breath sounds Heart: systolic murmur, patient aware.  Radial pulses 2+ symmetrical bilaterally Abdomen: soft, non-distended; normal active bowel sounds; non-tender to light and deep palpation;  negative rebound Back: no CVA tenderness Extremities: no edema; symmetrical with no gross deformities Skin: warm and dry; hyperpigmented vesicular lesions in dermatomal distribution without active drainage; mildly tender with palpation; mild underlying erythema Neurologic: normal gait Psychological: alert and cooperative; normal mood and affect          ASSESSMENT & PLAN:  1. Herpes zoster without complication     Meds ordered this encounter  Medications  . valACYclovir (VALTREX) 1000 MG tablet    Sig: Take 1 tablet (1,000 mg total) by mouth 3 (three) times daily for 10 days.      Dispense:  30 tablet    Refill:  0    Order Specific Question:   Supervising Provider    Answer:   Wynona Luna [phone]  . Lidocaine 1.8 % [facility]    Sig: Apply 1 patch topically daily.    Dispense:  30 patch    Refill:  0    Order Specific Question:   Supervising Provider    Answer:   Wynona Luna [phone]   Rest and use ice/heat as needed for symptomatic relief Prescribed valacyclovir 1000mg  3x/day for 10 days Prescribed lidocaine patches as needed for pain.  Please wash hands after  each use Follow up with PCP in 7-10 days if rash is still present Follow up with PCP if symptoms of burning, stinging, tingling or numbness occur after rash resolves, you may need additional treatment Return here or go to ER if you have any new or worsening symptoms (such as eye involvement, severe pain, or signs of secondary infection such as fever, chills, nausea, vomiting, discharge, redness or warmth over site of rash)   Reviewed expectations re: course of current medical issues. Questions answered. Outlined signs and symptoms indicating need for more acute intervention. Patient verbalized understanding. After Visit Summary given.       Lestine Box, PA-C 11/04/17 1846

## 2017-11-04 NOTE — Discharge Instructions (Addendum)
Rest and use ice/heat as needed for symptomatic relief Prescribed valacyclovir 1000mg  3x/day for 10 days Prescribed lidocaine patches as needed for pain.  Please wash hands after each use Follow up with PCP in 7-10 days if rash is still present Follow up with PCP if symptoms of burning, stinging, tingling or numbness occur after rash resolves, you may need additional treatment Return here or go to ER if you have any new or worsening symptoms (such as eye involvement, severe pain, or signs of secondary infection such as fever, chills, nausea, vomiting, discharge, redness or warmth over site of rash)

## 2017-11-30 ENCOUNTER — Encounter (HOSPITAL_COMMUNITY): Payer: Self-pay

## 2017-11-30 ENCOUNTER — Emergency Department (HOSPITAL_COMMUNITY): Payer: Medicare Other

## 2017-11-30 ENCOUNTER — Inpatient Hospital Stay (HOSPITAL_COMMUNITY)
Admission: EM | Admit: 2017-11-30 | Discharge: 2017-12-08 | DRG: 501 | Disposition: A | Discharge status: Home Health | Payer: Medicare Other | Attending: Internal Medicine | Admitting: Internal Medicine

## 2017-11-30 ENCOUNTER — Inpatient Hospital Stay (HOSPITAL_COMMUNITY): Payer: Medicare Other

## 2017-11-30 ENCOUNTER — Ambulatory Visit (HOSPITAL_BASED_OUTPATIENT_CLINIC_OR_DEPARTMENT_OTHER): Payer: Medicare Other

## 2017-11-30 ENCOUNTER — Other Ambulatory Visit: Payer: Self-pay

## 2017-11-30 DIAGNOSIS — Z7982 Long term (current) use of aspirin: Secondary | ICD-10-CM

## 2017-11-30 DIAGNOSIS — C7989 Secondary malignant neoplasm of other specified sites: Secondary | ICD-10-CM

## 2017-11-30 DIAGNOSIS — Z96641 Presence of right artificial hip joint: Secondary | ICD-10-CM | POA: Diagnosis present

## 2017-11-30 DIAGNOSIS — Z923 Personal history of irradiation: Secondary | ICD-10-CM | POA: Diagnosis not present

## 2017-11-30 DIAGNOSIS — Z9013 Acquired absence of bilateral breasts and nipples: Secondary | ICD-10-CM

## 2017-11-30 DIAGNOSIS — Z87891 Personal history of nicotine dependence: Secondary | ICD-10-CM

## 2017-11-30 DIAGNOSIS — M6588 Other synovitis and tenosynovitis, other site: Secondary | ICD-10-CM | POA: Diagnosis present

## 2017-11-30 DIAGNOSIS — M1990 Unspecified osteoarthritis, unspecified site: Secondary | ICD-10-CM | POA: Diagnosis present

## 2017-11-30 DIAGNOSIS — E785 Hyperlipidemia, unspecified: Secondary | ICD-10-CM | POA: Diagnosis present

## 2017-11-30 DIAGNOSIS — I517 Cardiomegaly: Secondary | ICD-10-CM | POA: Diagnosis not present

## 2017-11-30 DIAGNOSIS — Z90710 Acquired absence of both cervix and uterus: Secondary | ICD-10-CM

## 2017-11-30 DIAGNOSIS — M00032 Staphylococcal arthritis, left wrist: Secondary | ICD-10-CM | POA: Diagnosis not present

## 2017-11-30 DIAGNOSIS — L03114 Cellulitis of left upper limb: Secondary | ICD-10-CM | POA: Diagnosis present

## 2017-11-30 DIAGNOSIS — C50919 Malignant neoplasm of unspecified site of unspecified female breast: Secondary | ICD-10-CM | POA: Diagnosis present

## 2017-11-30 DIAGNOSIS — M659 Synovitis and tenosynovitis, unspecified: Secondary | ICD-10-CM | POA: Diagnosis not present

## 2017-11-30 DIAGNOSIS — R7881 Bacteremia: Secondary | ICD-10-CM | POA: Diagnosis present

## 2017-11-30 DIAGNOSIS — E7800 Pure hypercholesterolemia, unspecified: Secondary | ICD-10-CM | POA: Diagnosis present

## 2017-11-30 DIAGNOSIS — E119 Type 2 diabetes mellitus without complications: Secondary | ICD-10-CM | POA: Diagnosis present

## 2017-11-30 DIAGNOSIS — Z794 Long term (current) use of insulin: Secondary | ICD-10-CM | POA: Diagnosis not present

## 2017-11-30 DIAGNOSIS — M7989 Other specified soft tissue disorders: Secondary | ICD-10-CM

## 2017-11-30 DIAGNOSIS — B9561 Methicillin susceptible Staphylococcus aureus infection as the cause of diseases classified elsewhere: Secondary | ICD-10-CM | POA: Diagnosis present

## 2017-11-30 DIAGNOSIS — Z8572 Personal history of non-Hodgkin lymphomas: Secondary | ICD-10-CM

## 2017-11-30 DIAGNOSIS — I700 Atherosclerosis of aorta: Secondary | ICD-10-CM | POA: Diagnosis not present

## 2017-11-30 DIAGNOSIS — I10 Essential (primary) hypertension: Secondary | ICD-10-CM | POA: Diagnosis present

## 2017-11-30 DIAGNOSIS — Z888 Allergy status to other drugs, medicaments and biological substances status: Secondary | ICD-10-CM

## 2017-11-30 DIAGNOSIS — Z79899 Other long term (current) drug therapy: Secondary | ICD-10-CM | POA: Diagnosis not present

## 2017-11-30 DIAGNOSIS — Z853 Personal history of malignant neoplasm of breast: Secondary | ICD-10-CM | POA: Diagnosis not present

## 2017-11-30 DIAGNOSIS — M009 Pyogenic arthritis, unspecified: Secondary | ICD-10-CM | POA: Diagnosis not present

## 2017-11-30 DIAGNOSIS — R600 Localized edema: Secondary | ICD-10-CM

## 2017-11-30 DIAGNOSIS — I890 Lymphedema, not elsewhere classified: Secondary | ICD-10-CM

## 2017-11-30 DIAGNOSIS — K219 Gastro-esophageal reflux disease without esophagitis: Secondary | ICD-10-CM | POA: Diagnosis present

## 2017-11-30 LAB — BASIC METABOLIC PANEL
Anion gap: 14 (ref 5–15)
BUN: 10 mg/dL (ref 8–23)
CHLORIDE: 99 mmol/L (ref 98–111)
CO2: 22 mmol/L (ref 22–32)
Calcium: 8.7 mg/dL — ABNORMAL LOW (ref 8.9–10.3)
Creatinine, Ser: 0.74 mg/dL (ref 0.44–1.00)
GFR calc Af Amer: >60 mL/min (ref >60)
GFR calc non Af Amer: >60 mL/min (ref >60)
GLUCOSE: 150 mg/dL — AB (ref 70–99)
POTASSIUM: 3.8 mmol/L (ref 3.5–5.1)
SODIUM: 135 mmol/L (ref 135–145)

## 2017-11-30 LAB — CBC
HEMATOCRIT: 38.1 % (ref 36.0–46.0)
Hemoglobin: 11.7 g/dL — ABNORMAL LOW (ref 12.0–15.0)
MCH: 24.3 pg — ABNORMAL LOW (ref 26.0–34.0)
MCHC: 30.7 g/dL (ref 30.0–36.0)
MCV: 79 fL (ref 78.0–100.0)
Platelets: 331 10*3/uL (ref 150–400)
RBC: 4.82 MIL/uL (ref 3.87–5.11)
RDW: 15.1 % (ref 11.5–15.5)
WBC: 14 10*3/uL — AB (ref 4.0–10.5)

## 2017-11-30 LAB — I-STAT TROPONIN, ED: Troponin i, poc: 0.02 ng/mL (ref 0.00–0.08)

## 2017-11-30 LAB — SEDIMENTATION RATE: Sed Rate: 84 mm/h — ABNORMAL HIGH (ref 0–22)

## 2017-11-30 LAB — C-REACTIVE PROTEIN: CRP: 27.7 mg/dL — ABNORMAL HIGH

## 2017-11-30 LAB — D-DIMER, QUANTITATIVE: D-Dimer, Quant: 3.58 ug{FEU}/mL — ABNORMAL HIGH (ref 0.00–0.50)

## 2017-11-30 LAB — I-STAT CG4 LACTIC ACID, ED: Lactic Acid, Venous: 1.78 mmol/L (ref 0.5–1.9)

## 2017-11-30 MED ORDER — SODIUM CHLORIDE 0.9 % IV SOLN
1.0000 g | Freq: Three times a day (TID) | INTRAVENOUS | Status: DC
  Administered 2017-12-01 – 2017-12-02 (×5): 1 g via INTRAVENOUS
  Filled 2017-11-30 (×6): qty 1

## 2017-11-30 MED ORDER — MORPHINE SULFATE (PF) 2 MG/ML IV SOLN: 2.0000 mg | INTRAVENOUS | Status: DC | PRN

## 2017-11-30 MED ORDER — AMLODIPINE BESYLATE 10 MG PO TABS
10.0000 mg | ORAL_TABLET | Freq: Every day | ORAL | Status: DC
  Administered 2017-12-01 – 2017-12-08 (×8): 10 mg via ORAL
  Filled 2017-11-30 (×8): qty 1

## 2017-11-30 MED ORDER — IOPAMIDOL (ISOVUE-370) INJECTION 76%
INTRAVENOUS | Status: AC
  Filled 2017-11-30: qty 100

## 2017-11-30 MED ORDER — LACTATED RINGERS IV BOLUS
1000.0000 mL | Freq: Once | INTRAVENOUS | Status: AC
  Administered 2017-11-30: 1000 mL via INTRAVENOUS

## 2017-11-30 MED ORDER — IOPAMIDOL (ISOVUE-370) INJECTION 76%: 100.0000 mL | Freq: Once | INTRAVENOUS | Status: DC | PRN

## 2017-11-30 MED ORDER — LOSARTAN POTASSIUM-HCTZ 100-25 MG PO TABS: 1.0000 | ORAL_TABLET | Freq: Every day | ORAL | Status: DC

## 2017-11-30 MED ORDER — VANCOMYCIN HCL 10 G IV SOLR
1500.0000 mg | INTRAVENOUS | Status: DC
  Administered 2017-11-30 – 2017-12-01 (×2): 1500 mg via INTRAVENOUS
  Filled 2017-11-30 (×3): qty 1500

## 2017-11-30 MED ORDER — IOPAMIDOL (ISOVUE-370) INJECTION 76%
100.0000 mL | Freq: Once | INTRAVENOUS | Status: AC | PRN
  Administered 2017-11-30: 100 mL via INTRAVENOUS

## 2017-11-30 MED ORDER — LOSARTAN POTASSIUM 50 MG PO TABS: 100.0000 mg | ORAL_TABLET | Freq: Every day | ORAL | Status: DC

## 2017-11-30 MED ORDER — INSULIN ASPART 100 UNIT/ML ~~LOC~~ SOLN: 0.0000 [IU] | Freq: Every day | SUBCUTANEOUS | Status: DC

## 2017-11-30 MED ORDER — SODIUM CHLORIDE 0.9 % IV SOLN
INTRAVENOUS | Status: DC
  Administered 2017-12-01: 01:00:00 via INTRAVENOUS

## 2017-11-30 MED ORDER — SODIUM CHLORIDE 0.9 % IV SOLN
1.0000 g | Freq: Once | INTRAVENOUS | Status: AC
  Administered 2017-11-30: 1 g via INTRAVENOUS
  Filled 2017-11-30: qty 1

## 2017-11-30 MED ORDER — INSULIN ASPART 100 UNIT/ML ~~LOC~~ SOLN
0.0000 [IU] | Freq: Three times a day (TID) | SUBCUTANEOUS | Status: DC
  Administered 2017-12-01: 3 [IU] via SUBCUTANEOUS
  Administered 2017-12-03 – 2017-12-04 (×3): 2 [IU] via SUBCUTANEOUS
  Administered 2017-12-06: 1 [IU] via SUBCUTANEOUS
  Administered 2017-12-07: 2 [IU] via SUBCUTANEOUS

## 2017-11-30 MED ORDER — FENTANYL CITRATE (PF) 100 MCG/2ML IJ SOLN
50.0000 ug | Freq: Once | INTRAMUSCULAR | Status: AC
  Administered 2017-11-30: 50 ug via INTRAVENOUS
  Filled 2017-11-30: qty 2

## 2017-11-30 MED ORDER — HEPARIN SODIUM (PORCINE) 5000 UNIT/ML IJ SOLN
5000.0000 [IU] | Freq: Three times a day (TID) | INTRAMUSCULAR | Status: DC
  Administered 2017-12-01 – 2017-12-07 (×20): 5000 [IU] via SUBCUTANEOUS
  Filled 2017-11-30 (×20): qty 1

## 2017-11-30 MED ORDER — HYDROCHLOROTHIAZIDE 25 MG PO TABS: 25.0000 mg | ORAL_TABLET | Freq: Every day | ORAL | Status: DC

## 2017-11-30 MED ORDER — VANCOMYCIN HCL IN DEXTROSE 750-5 MG/150ML-% IV SOLN
750.0000 mg | Freq: Two times a day (BID) | INTRAVENOUS | Status: DC
  Filled 2017-11-30: qty 150

## 2017-11-30 MED ORDER — INSULIN NPH (HUMAN) (ISOPHANE) 100 UNIT/ML ~~LOC~~ SUSP
40.0000 [IU] | Freq: Every day | SUBCUTANEOUS | Status: DC
  Administered 2017-12-02 – 2017-12-07 (×3): 40 [IU] via SUBCUTANEOUS
  Filled 2017-11-30: qty 10

## 2017-11-30 MED ORDER — TAMOXIFEN CITRATE 10 MG PO TABS
20.0000 mg | ORAL_TABLET | Freq: Every day | ORAL | Status: DC
  Administered 2017-12-01 – 2017-12-08 (×8): 20 mg via ORAL
  Filled 2017-11-30 (×8): qty 2

## 2017-11-30 MED ORDER — KETOROLAC TROMETHAMINE 30 MG/ML IJ SOLN: 30.0000 mg | Freq: Four times a day (QID) | INTRAMUSCULAR | Status: DC | PRN

## 2017-11-30 MED ORDER — OMEGA-3-ACID ETHYL ESTERS 1 G PO CAPS
1.0000 g | ORAL_CAPSULE | Freq: Every day | ORAL | Status: DC
  Administered 2017-12-01 – 2017-12-08 (×7): 1 g via ORAL
  Filled 2017-11-30 (×9): qty 1

## 2017-11-30 MED ORDER — ASPIRIN EC 325 MG PO TBEC
325.0000 mg | DELAYED_RELEASE_TABLET | Freq: Two times a day (BID) | ORAL | Status: DC
  Filled 2017-11-30: qty 1

## 2017-11-30 NOTE — Progress Notes (Signed)
Left upper extremity venous duplex completed. - Preliminary results - There is no obvious evidence of DVT or superficial thrombosis. Moderate interstitial fluid noted in the distal forearm and wrist. Tami Schmidt. RVS  11/30/2017 2:36 PM

## 2017-11-30 NOTE — ED Triage Notes (Signed)
Pt presents for evaluation of L arm swelling starting Monday night and shortness of breath with exertion. Pt reports pain to L arm as well. Denies cp.

## 2017-11-30 NOTE — ED Notes (Signed)
Patient transported to MRI 

## 2017-11-30 NOTE — H&P (Signed)
History and Physical    Tami Schmidt:[id number] DOB: 24-Sep-1932 DOA: 11/30/2017  Referring MD/NP/PA: Dr Ronnald Nian  PCP: Donald Prose, MD   Patient coming from: Home  Chief Complaint: left arm swelling  HPI: Tami Schmidt is a 84 y.o. female with medical history significant of breast cancer status post treatment with recurrent lymphedema in the left upper extremity, patient had bilateral mastectomy, history of diabetes, hypertension and hyperlipidemia who presented to the ER with pain swelling of the left hand and wrist over the last 1 week.  Swelling has been progressive.  Associated with some fever.  Also limitation in the range of motion.  Patient was seen and evaluated with a suspicion of possible abscess in addition to the cellulitis.  General surgery have been consulted and patient is to be evaluated.  She denied any injuries.  Patient has had chronic lymphedema but the redness and tenderness is new.  ED Course: Patient has been seen and evaluated.  X-ray of the Hand including CT scan so far negative for any abscess.  MRI has been ordered.  Surgery has been consulted to follow-up with patient.  Doppler ultrasound showed no obvious DVT.  White count is 14,000 with hemoglobin 11.7.  Creatinine 0.74 glucose 150 and calcium 8.7.  Review of Systems: As per HPI otherwise 10 point review of systems negative.    Past Medical History:  Diagnosis Date  . Arthritis   . Breast cancer ([place])    b/l mastectomies hx  . Cancer [facility])    breast  . 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 Diabetes mellitus    fasting 90-100  . HTN (hypertension) 03/25/2013  . Hx of radiation therapy    breasts hx  . Hypercholesterolemia   . Hypertension   . Lymphedema of arm    left arm  . Type II or unspecified type diabetes mellitus without mention of complication, not stated as uncontrolled 03/25/2013    Past Surgical History:  Procedure Laterality Date  . ABDOMINAL HYSTERECTOMY      . ANKLE SURGERY Right 1995  . APPENDECTOMY    . BREAST SURGERY Bilateral    mastectomy  . HERNIA REPAIR  04-26-2010  . MASS EXCISION Left 01/26/2013   Procedure: EXCISION LEFT CHEST WALL MASS AND LEFT ABDOMNAL WALL MASS;  Surgeon: Harl Bowie, MD;  Location: [place];  Service: General;  Laterality: Left;  Marland Kitchen MASS EXCISION Left 09/20/2014   Procedure: EXCISION OF LEFT CHEST WALL MASS;  Surgeon: Coralie Keens, MD;  Location: [place];  Service: General;  Laterality: Left;  . TOTAL HIP ARTHROPLASTY Right 01/05/2017  . TOTAL HIP ARTHROPLASTY Right 01/05/2017   Procedure: TOTAL HIP ARTHROPLASTY ANTERIOR APPROACH;  Surgeon: Frederik Pear, MD;  Location: [place];  Service: Orthopedics;  Laterality: Right;     reports that she quit smoking about 45 years ago. Her smoking use included cigarettes. She has never used smokeless tobacco. She reports that she drinks alcohol. She reports that she does not use drugs.  Allergies  Allergen Reactions  . Bactrim Swelling    SWELLING OF MOUTH/FACE.  Marland Kitchen Lisinopril Swelling    SWELLING OF MOUTH/FACE.  Marland Kitchen Vasotec Swelling    SWELLING OF MOUTH/FACE.    No family history on file.   Prior to Admission medications   Medication Sig Start Date End Date Taking? Authorizing Provider  amLODipine (NORVASC) 10 MG tablet Take 10 mg by mouth daily with breakfast.    Yes [provider]  aspirin EC 325 MG tablet Take 1 tablet (325 mg total) by mouth 2 (two) times daily. 01/05/17  Yes Joanell Rising K, PA-C  fish oil-omega-3 fatty acids 1000 MG capsule Take 1 g by mouth daily with breakfast.    Yes [provider]  HUMULIN N 100 UNIT/ML injection Inject 40 Units into the skin daily. 11/29/16  Yes [provider]  losartan-hydrochlorothiazide (HYZAAR) 100-25 MG per tablet Take 1 tablet by mouth daily with breakfast.  04/26/12  Yes [provider]  metFORMIN (GLUCOPHAGE) 500 MG tablet Take 500 mg by mouth daily with  breakfast.   Yes [provider]  tamoxifen (NOLVADEX) 20 MG tablet Take 1 tablet (20 mg total) by mouth daily. 06/29/17  Yes Magrinat, Virgie Dad, MD  Lidocaine 1.8 % PTCH Apply 1 patch topically daily. Patient not taking: Reported on 11/30/2017 11/04/17   Lestine Box, [place]    Physical Exam: Vitals:   11/30/17 1930 11/30/17 2023 11/30/17 2030 11/30/17 2100  BP: 122/83 (!) 161/80 (!) 148/70 (!) 149/75  Pulse: (!) 102 97 96 98  Resp: 19 17 17  (!) 21  Temp:      TempSrc:      SpO2:  98%    Weight:      Height:          Constitutional: NAD, calm, comfortable Vitals:   11/30/17 1930 11/30/17 2023 11/30/17 2030 11/30/17 2100  BP: 122/83 (!) 161/80 (!) 148/70 (!) 149/75  Pulse: (!) 102 97 96 98  Resp: 19 17 17  (!) 21  Temp:      TempSrc:      SpO2:  98%    Weight:      Height:       Eyes: PERRL, lids and conjunctivae normal ENMT: Mucous membranes are moist. Posterior pharynx clear of any exudate or lesions.Normal dentition.  Neck: normal, supple, no masses, no thyromegaly Respiratory: clear to auscultation bilaterally, no wheezing, no crackles. Normal respiratory effort. No accessory muscle use.  Cardiovascular: Regular rate and rhythm, no murmurs / rubs / gallops. No extremity edema. 2+ pedal pulses. No carotid bruits.  Abdomen: no tenderness, no masses palpated. No hepatosplenomegaly. Bowel sounds positive.  Musculoskeletal: Acutely swollen left upper extremity, edematous, tender, decreased range of motion, no clubbing / cyanosis. No joint deformity upper and lower extremities. no contractures. Normal muscle tone.  Skin: no rashes, lesions, ulcers. No induration Neurologic: CN 2-12 grossly intact. Sensation intact, DTR normal. Strength 5/5 in all 4.  Psychiatric: Normal judgment and insight. Alert and oriented x 3. Normal mood.   Labs on Admission: I have personally reviewed following labs and imaging studies  CBC: Recent Labs  Lab 11/30/17 1212  WBC 14.0*  HGB  11.7*  HCT 38.1  MCV 79.0  PLT 272   Basic Metabolic Panel: Recent Labs  Lab 11/30/17 1212  NA 135  K 3.8  CL 99  CO2 22  GLUCOSE 150*  BUN 10  CREATININE 0.74  CALCIUM 8.7*   GFR: Estimated Creatinine Clearance: 62 mL/min (by C-G formula based on SCr of 0.74 mg/dL). Liver Function Tests: No results for input(s): AST, ALT, ALKPHOS, BILITOT, PROT, ALBUMIN in the last 168 hours. No results for input(s): LIPASE, AMYLASE in the last 168 hours. No results for input(s): AMMONIA in the last 168 hours. Coagulation Profile: No results for input(s): INR, PROTIME in the last 168 hours. Cardiac Enzymes: No results for input(s): CKTOTAL, CKMB, CKMBINDEX, TROPONINI in the last 168 hours. BNP (last  3 results) No results for input(s): PROBNP in the last 8760 hours. HbA1C: No results for input(s): HGBA1C in the last 72 hours. CBG: No results for input(s): GLUCAP in the last 168 hours. Lipid Profile: No results for input(s): CHOL, HDL, LDLCALC, TRIG, CHOLHDL, LDLDIRECT in the last 72 hours. Thyroid Function Tests: No results for input(s): TSH, T4TOTAL, FREET4, T3FREE, THYROIDAB in the last 72 hours. Anemia Panel: No results for input(s): VITAMINB12, FOLATE, FERRITIN, TIBC, IRON, RETICCTPCT in the last 72 hours. Urine analysis:    Component Value Date/Time   COLORURINE YELLOW 12/26/2016 1221   APPEARANCEUR HAZY (A) 12/26/2016 1221   LABSPEC 1.021 12/26/2016 1221   PHURINE 6.0 12/26/2016 1221   GLUCOSEU NEGATIVE 12/26/2016 1221   HGBUR NEGATIVE 12/26/2016 1221   BILIRUBINUR NEGATIVE 12/26/2016 1221   KETONESUR NEGATIVE 12/26/2016 1221   PROTEINUR NEGATIVE 12/26/2016 1221   UROBILINOGEN 0.2 04/26/2010 0840   NITRITE NEGATIVE 12/26/2016 1221   LEUKOCYTESUR NEGATIVE 12/26/2016 1221   Sepsis Labs: @LABRCNTIP (procalcitonin:4,lacticidven:4) )No results found for this or any previous visit (from the past 240 hour(s)).   Radiological Exams on Admission: Ct Angio Chest Pe W/cm &/or Wo  Cm  Result Date: 11/30/2017 CLINICAL DATA:  Left arm swelling beginning on Monday. Shortness of breath with exertion. EXAM: CT ANGIOGRAPHY CHEST WITH CONTRAST TECHNIQUE: Multidetector CT imaging of the chest was performed using the standard protocol during bolus administration of intravenous contrast. Multiplanar CT image reconstructions and MIPs were obtained to evaluate the vascular anatomy. CONTRAST:  151mL ISOVUE-370 IOPAMIDOL (ISOVUE-370) INJECTION 76% COMPARISON:  01/20/2017 FINDINGS: Cardiovascular: Pulmonary arterial opacification is excellent. There are no pulmonary emboli. There is aortic atherosclerosis. The heart is mildly enlarged. Mediastinum/Nodes: No mediastinal or hilar mass or lymphadenopathy. Lungs/Pleura: No pulmonary parenchymal pathology.  No pleural fluid. Upper Abdomen: Negative Musculoskeletal: There is subpectoral adenopathy on the left with some calcification. This was present on the previous exam. No acute bone finding. Chronic ankylosis of the spine. Thyroid nodules/cysts as seen previously. Review of the MIP images confirms the above findings. IMPRESSION: No pulmonary emboli or acute chest pathology. Subpectoral adenopathy on the left as seen previously. Aortic Atherosclerosis (ICD10-I70.0). Electronically Signed   By: Nelson Chimes M.D.   On: 11/30/2017 20:22    Assessment/Plan Principal Problem:   Cellulitis of left upper extremity Active Problems:   Chest wall recurrence of breast cancer (HCC)   HTN (hypertension)   Lymphedema of upper extremity   #1 cellulitis and possible abscess of the left upper extremity: Patient is being admitted and initiated on IV vancomycin and cefepime.  MRI of the left upper extremity is currently pending.  Orthopedics have been consulted.  Patient is likely going to have aspiration or incision and drainage if abscess is confirmed.  Most likely this is lymphedema secondary to previous breast cancer treatment with secondary infection.  Blood  cultures have been obtained.  #2 lymphedema of the left upper extremity: as indicated above this is most likely related to previous breast cancer treatment.  Elevate the arm.  Supportive care to be given.  #3 hypertension: Blood pressure appears improved.  No change in therapy  #4 history of breast cancer: Currently not on any ongoing treatment.  Continue monitoring  #5 diabetes: Patient will be placed on sliding scale insulin with blood sugar checked q. before meals and nightly.  DVT prophylaxis: Lovenox  Code Status: Full code  Family Communication: No family available discussed carefully was patient  Disposition Plan: To be determined  Consults called: General surgery,  Dr Redmond Pulling   Admission status: Inpatient  Severity of Illness: The appropriate patient status for this patient is INPATIENT. Inpatient status is judged to be reasonable and necessary in order to provide the required intensity of service to ensure the patient's safety. The patient's presenting symptoms, physical exam findings, and initial radiographic and laboratory data in the context of their chronic comorbidities is felt to place them at high risk for further clinical deterioration. Furthermore, it is not anticipated that the patient will be medically stable for discharge from the hospital within 2 midnights of admission. The following factors support the patient status of inpatient.   " The patient's presenting symptoms include left forearm swelling and redness. " The worrisome physical exam findings include redness of the left upper arm was decreased range of motion. " The initial radiographic and laboratory data are worrisome because of leukocytosis with mild fever. " The chronic co-morbidities include history of left breast cancer.   * I certify that at the point of admission it is my clinical judgment that the patient will require inpatient hospital care spanning beyond 2 midnights from the point of admission due to  high intensity of service, high risk for further deterioration and high frequency of surveillance required.Barbette Merino MD [facility] Pager [phone]  If 7PM-7AM, please contact night-coverage [email or web address] Password [email or web address]  11/30/2017, 10:30 PM

## 2017-11-30 NOTE — ED Notes (Signed)
Got patient undress on the monitor patient is resting with family at bedside and call bell in reach 

## 2017-11-30 NOTE — Progress Notes (Signed)
Pharmacy Antibiotic Note  Tami Schmidt is a 84 y.o. female admitted on 11/30/2017 with cellulitis.  Pharmacy has been consulted for vancomycin dosing. Tmax is 100.3 and WBC is elevated at 14. SCr is WNL.   Plan: Vancomycin 1500mg  IV Q24H Cefepime 1G IV q8H F/u renal fxn, C&S, clinical status and trough at [facility] F/u continuation of cefepime or other gram negative coverage  Height: 5\' 6"  (167.6 cm) Weight: 209 lb 14.1 oz (95.2 kg) IBW/kg (Calculated) : 59.3  Temp (24hrs), Avg:99.1 F (37.3 C), Min:97.8 F (36.6 C), Max:100.3 F (37.9 C)  Recent Labs  Lab 11/30/17 1212 11/30/17 1440  WBC 14.0*  --   CREATININE 0.74  --   LATICACIDVEN  --  1.78    Estimated Creatinine Clearance: 62 mL/min (by C-G formula based on SCr of 0.74 mg/dL).    Allergies  Allergen Reactions  . Bactrim Swelling    SWELLING OF MOUTH/FACE.  Marland Kitchen Lisinopril Swelling    SWELLING OF MOUTH/FACE.  Marland Kitchen Vasotec Swelling    SWELLING OF MOUTH/FACE.    Antimicrobials this admission: Vanc 8/19>> Cefepime 8/19>>  Dose adjustments this admission: N/A  Microbiology results: Pending  Thank you for allowing pharmacy to be a part of this patient's care.  [place] 11/30/2017 10:43 PM

## 2017-11-30 NOTE — ED Notes (Signed)
Patient transported to CT 

## 2017-11-30 NOTE — ED Notes (Signed)
Delay in CT and abx due to loss of IV access

## 2017-11-30 NOTE — Consult Note (Addendum)
Reason for Consult:Left hand/wrist cellulitis Referring Physician: A [place]  TESHARA MOREE is an 84 y.o. female.  HPI: Carl began to develop left hand pain last Tuesday. It was relatively mild until Thursday when it got much worse. She been trying to manage at home but finally came to the ED for evaluation. She denies fevers, chills, sweats, N/V, N/T at home but has a fever here. She's also had some SOB that she's been attributing to the pain. She's been using ibuprofen for pain control with some success. She denies prior hx/o similar or any trauma to hand.  Past Medical History:  Diagnosis Date  . Arthritis   . Breast cancer ([place])    b/l mastectomies hx  . Cancer [facility])    breast  . 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[place]) 03/25/2013  . Diabetes mellitus    fasting 90-100  . HTN (hypertension) 03/25/2013  . Hx of radiation therapy    breasts hx  . Hypercholesterolemia   . Hypertension   . Lymphedema of arm    left arm  . Type II or unspecified type diabetes mellitus without mention of complication, not stated as uncontrolled 03/25/2013    Past Surgical History:  Procedure Laterality Date  . ABDOMINAL HYSTERECTOMY    . ANKLE SURGERY Right 1995  . APPENDECTOMY    . BREAST SURGERY Bilateral    mastectomy  . HERNIA REPAIR  04-26-2010  . MASS EXCISION Left 01/26/2013   Procedure: EXCISION LEFT CHEST WALL MASS AND LEFT ABDOMNAL WALL MASS;  Surgeon: Harl Bowie, MD;  Location: [place];  Service: General;  Laterality: Left;  Marland Kitchen MASS EXCISION Left 09/20/2014   Procedure: EXCISION OF LEFT CHEST WALL MASS;  Surgeon: Coralie Keens, MD;  Location: [place];  Service: General;  Laterality: Left;  . TOTAL HIP ARTHROPLASTY Right 01/05/2017  . TOTAL HIP ARTHROPLASTY Right 01/05/2017   Procedure: TOTAL HIP ARTHROPLASTY ANTERIOR APPROACH;  Surgeon: Frederik Pear, MD;  Location: [place];  Service: Orthopedics;  Laterality: Right;    No family history on file.  Social  History:  reports that she quit smoking about 45 years ago. Her smoking use included cigarettes. She has never used smokeless tobacco. She reports that she drinks alcohol. She reports that she does not use drugs.  Allergies:  Allergies  Allergen Reactions  . Bactrim Swelling    SWELLING OF MOUTH/FACE.  Marland Kitchen Lisinopril Swelling    SWELLING OF MOUTH/FACE.  Marland Kitchen Vasotec Swelling    SWELLING OF MOUTH/FACE.    Medications: I have reviewed the patient's current medications.  Results for orders placed or performed during the hospital encounter of 11/30/17 (from the past 48 hour(s))  Basic metabolic panel     Status: Abnormal   Collection Time: 11/30/17 12:12 PM  Result Value Ref Range   Sodium 135 135 - 145 mmol/L   Potassium 3.8 3.5 - 5.1 mmol/L   Chloride 99 98 - 111 mmol/L   CO2 22 22 - 32 mmol/L   Glucose, Bld 150 (H) 70 - 99 mg/dL   BUN 10 8 - 23 mg/dL   Creatinine, Ser 0.74 0.44 - 1.00 mg/dL   Calcium 8.7 (L) 8.9 - 10.3 mg/dL   GFR calc non Af Amer >60 >60 mL/min   GFR calc Af Amer >60 >60 mL/min    Comment: (NOTE) The eGFR has been calculated using the CKD EPI equation. This calculation has not been validated in all clinical situations. eGFR's persistently <60 mL/min signify possible  Chronic Kidney Disease.    Anion gap 14 5 - 15    Comment: Performed at [facility] [address], [place], [place] [address]  CBC     Status: Abnormal   Collection Time: 11/30/17 12:12 PM  Result Value Ref Range   WBC 14.0 (H) 4.0 - 10.5 K/uL   RBC 4.82 3.87 - 5.11 MIL/uL   Hemoglobin 11.7 (L) 12.0 - 15.0 g/dL   HCT 38.1 36.0 - 46.0 %   MCV 79.0 78.0 - 100.0 fL   MCH 24.3 (L) 26.0 - 34.0 pg   MCHC 30.7 30.0 - 36.0 g/dL   RDW 15.1 11.5 - 15.5 %   Platelets 331 150 - 400 K/uL    Comment: Performed at [facility] [address], [place], [place] [address]  D-dimer, quantitative     Status: Abnormal   Collection Time: 11/30/17 12:12 PM  Result Value Ref Range   D-Dimer, Quant  3.58 (H) 0.00 - 0.50 ug/mL-FEU    Comment: (NOTE) At the manufacturer cut-off of 0.50 ug/mL FEU, this assay has been documented to exclude PE with a sensitivity and negative predictive value of 97 to 99%.  At this time, this assay has not been approved by the FDA to exclude DVT/VTE. Results should be correlated with clinical presentation. Performed at [facility], [place] [address], [place], [place] [address]   I-stat troponin, ED     Status: None   Collection Time: 11/30/17 12:16 PM  Result Value Ref Range   Troponin i, poc 0.02 0.00 - 0.08 ng/mL   Comment 3            Comment: Due to the release kinetics of cTnI, a negative result within the first hours of the onset of symptoms does not rule out myocardial infarction with certainty. If myocardial infarction is still suspected, repeat the test at appropriate intervals.   I-Stat CG4 Lactic Acid, ED     Status: None   Collection Time: 11/30/17  2:40 PM  Result Value Ref Range   Lactic Acid, Venous 1.78 0.5 - 1.9 mmol/L    No results found.  Review of Systems  Constitutional: Negative for weight loss.  HENT: Negative for ear discharge, ear pain, hearing loss and tinnitus.   Eyes: Negative for blurred vision, double vision, photophobia and pain.  Respiratory: Negative for cough, sputum production and shortness of breath.   Cardiovascular: Negative for chest pain.  Gastrointestinal: Negative for abdominal pain, nausea and vomiting.  Genitourinary: Negative for dysuria, flank pain, frequency and urgency.  Musculoskeletal: Positive for joint pain (Left hand/wrist). Negative for back pain, falls, myalgias and neck pain.  Neurological: Negative for dizziness, tingling, sensory change, focal weakness, loss of consciousness and headaches.  Endo/Heme/Allergies: Does not bruise/bleed easily.  Psychiatric/Behavioral: Negative for depression, memory loss and substance abuse. The patient is not nervous/anxious.    Blood pressure  124/63, pulse (!) 102, temperature 100.3 F (37.9 C), temperature source Oral, resp. rate 19, height 5' 6" (1.676 m), weight 95.2 kg, SpO2 100 %. Physical Exam  Constitutional: She appears well-developed and well-nourished. No distress.  HENT:  Head: Normocephalic and atraumatic.  Eyes: Conjunctivae are normal. Right eye exhibits no discharge. Left eye exhibits no discharge. No scleral icterus.  Neck: Normal range of motion.  Cardiovascular: Normal rate and regular rhythm.  Respiratory: Effort normal. No respiratory distress.  Musculoskeletal:  Left shoulder, elbow, wrist, digits- no skin wounds, hand edematous without fluctuance, no instability,  no blocks to motion  Sens  Ax/R/M/U intact  Mot   Ax/ R/ PIN/ M/ AIN/ U intact but limited by pain, able to range wrist ~30-40 degrees without significant pain  Rad 2+  Neurological: She is alert.  Skin: Skin is warm and dry. She is not diaphoretic.  Psychiatric: She has a normal mood and affect. Her behavior is normal.    Assessment/Plan: Left hand/wrist cellulitis -- I do not think we need to proceed immediately to the OR. We can see how she does overnight on IV abx and see if there's any improvement in the AM. However, Dr. Grandville Silos will need to evaluate tonight so please keep NPO for now. IM to admit.    Lisette Abu, PA-C Orthopedic Surgery [phone] 11/30/2017, 3:04 PM   Spoke with ED MD, who indicated patient had likely cellulitis, but voiced concern for possible septic wrist joint.  I recommended wrist aspiration for gram stain and culture if clinical concern exists for possible septic wrist joint, done by IR with fluoroscopic guidance if cannot be reliably done blindly at bedside.  Please re-consult if wrist aspiration gram stain is positive or cellulitis fails to improve with IV antibiotics.  Micheline Rough, MD Hand Surgery

## 2017-11-30 NOTE — ED Provider Notes (Addendum)
[facility] EMERGENCY DEPARTMENT Provider Note   CSN: [id number] Arrival date & time: 11/30/17  1124     History   Chief Complaint Chief Complaint  Patient presents with  . Hand Pain    HPI Tami Schmidt is a 84 y.o. female.  Patient with history of breast cancer and lymphedema in the left upper extremity who presents the ED with pain and swelling to left hand and wrists over the last week.  Patient with increasing swelling and pain, warmth, redness.  Patient denies any trauma.  Has had fever as well.  Has had shortness of breath but likely secondary to pain.  Patient states that she had an ultrasound done that showed no clot.  The history is provided by the patient.  Hand Pain  This is a new problem. The current episode started more than 2 days ago. The problem occurs constantly. The problem has been gradually worsening. Associated symptoms include shortness of breath (secondary to pain ). Pertinent negatives include no chest pain, no abdominal pain and no headaches. Nothing aggravates the symptoms. Nothing relieves the symptoms. She has tried nothing for the symptoms. The treatment provided no relief.    Past Medical History:  Diagnosis Date  . Arthritis   . Breast cancer ([place])    b/l mastectomies hx  . Cancer [facility])    breast  . 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 Diabetes mellitus    fasting 90-100  . HTN (hypertension) 03/25/2013  . Hx of radiation therapy    breasts hx  . Hypercholesterolemia   . Hypertension   . Lymphedema of arm    left arm  . Type II or unspecified type diabetes mellitus without mention of complication, not stated as uncontrolled 03/25/2013    Patient Active Problem List   Diagnosis Date Noted  . Cellulitis of left upper extremity 11/30/2017  . Primary osteoarthritis of right hip 01/05/2017  . Osteoarthritis of right hip 01/03/2017  . Lymphedema of upper extremity 01/17/2014  . Osteopenia 01/17/2014    . Malignant neoplasm of lower-inner quadrant of left breast in female, estrogen receptor positive ([place]) 06/16/2013  . Type II or unspecified type diabetes mellitus without mention of complication, not stated as uncontrolled 03/25/2013  . HTN (hypertension) 03/25/2013  . 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 Abdominal wall mass 01/14/2013  . Abdominal distension 06/14/2012    Past Surgical History:  Procedure Laterality Date  . ABDOMINAL HYSTERECTOMY    . ANKLE SURGERY Right 1995  . APPENDECTOMY    . BREAST SURGERY Bilateral    mastectomy  . HERNIA REPAIR  04-26-2010  . MASS EXCISION Left 01/26/2013   Procedure: EXCISION LEFT CHEST WALL MASS AND LEFT ABDOMNAL WALL MASS;  Surgeon: Harl Bowie, MD;  Location: [facility];  Service: General;  Laterality: Left;  Marland Kitchen MASS EXCISION Left 09/20/2014   Procedure: EXCISION OF LEFT CHEST WALL MASS;  Surgeon: Coralie Keens, MD;  Location: [facility];  Service: General;  Laterality: Left;  . TOTAL HIP ARTHROPLASTY Right 01/05/2017  . TOTAL HIP ARTHROPLASTY Right 01/05/2017   Procedure: TOTAL HIP ARTHROPLASTY ANTERIOR APPROACH;  Surgeon: Frederik Pear, MD;  Location: [facility];  Service: Orthopedics;  Laterality: Right;     OB History   None      Home Medications    Prior to Admission medications   Medication Sig Start Date End Date Taking? Authorizing Provider  amLODipine (NORVASC) 10 MG tablet  Take 10 mg by mouth daily with breakfast.    Yes [provider]  aspirin EC 325 MG tablet Take 1 tablet (325 mg total) by mouth 2 (two) times daily. 01/05/17  Yes Joanell Rising K, PA-C  fish oil-omega-3 fatty acids 1000 MG capsule Take 1 g by mouth daily with breakfast.    Yes [provider]  HUMULIN N 100 UNIT/ML injection Inject 40 Units into the skin daily. 11/29/16  Yes [provider]  losartan-hydrochlorothiazide (HYZAAR) 100-25 MG per tablet Take 1 tablet by mouth daily with breakfast.   04/26/12  Yes [provider]  metFORMIN (GLUCOPHAGE) 500 MG tablet Take 500 mg by mouth daily with breakfast.   Yes [provider]  tamoxifen (NOLVADEX) 20 MG tablet Take 1 tablet (20 mg total) by mouth daily. 06/29/17  Yes Magrinat, Virgie Dad, MD  Lidocaine 1.8 % PTCH Apply 1 patch topically daily. Patient not taking: Reported on 11/30/2017 11/04/17   Lestine Box, PA-C    Family History No family history on file.  Social History Social History   Tobacco Use  . Smoking status: Former Smoker    Types: Cigarettes    Last attempt to quit: 04/14/1972    Years since quitting: 45.6  . Smokeless tobacco: Never Used  Substance Use Topics  . Alcohol use: Yes    Comment: occasional  . Drug use: No     Allergies   Bactrim; Lisinopril; and Vasotec   Review of Systems Review of Systems  Constitutional: Negative for chills and fever.  HENT: Negative for ear pain and sore throat.   Eyes: Negative for pain and visual disturbance.  Respiratory: Positive for shortness of breath (secondary to pain ). Negative for cough.   Cardiovascular: Negative for chest pain and palpitations.  Gastrointestinal: Negative for abdominal pain and vomiting.  Genitourinary: Negative for dysuria and hematuria.  Musculoskeletal: Positive for arthralgias and joint swelling. Negative for back pain.  Skin: Positive for color change. Negative for rash.  Neurological: Negative for seizures, syncope and headaches.  All other systems reviewed and are negative.    Physical Exam Updated Vital Signs  ED Triage Vitals [11/30/17 1133]  Enc Vitals Group     BP (!) 141/51     Pulse Rate 100     Resp 16     Temp 97.8 F (36.6 C)     Temp Source Oral     SpO2 100 %     Weight      Height      Head Circumference      Peak Flow      Pain Score 10     Pain Loc      Pain Edu?      Excl. in [place]?     Physical Exam  Constitutional: She appears well-developed and well-nourished. No distress.   HENT:  Head: Normocephalic and atraumatic.  Mouth/Throat: Oropharynx is clear and moist. No oropharyngeal exudate.  Eyes: Pupils are equal, round, and reactive to light. Conjunctivae and EOM are normal.  Neck: Normal range of motion. Neck supple.  Cardiovascular: Normal rate, regular rhythm, normal heart sounds and intact distal pulses.  No murmur heard. Pulmonary/Chest: Effort normal and breath sounds normal. No stridor. No respiratory distress. She has no wheezes.  Abdominal: Soft. Bowel sounds are normal. There is no tenderness.  Musculoskeletal: Normal range of motion. She exhibits edema and tenderness.  Patient tender throughout left hand and wrist with increased pain with range of motion at  the wrist  Neurological: She is alert.  Skin: Skin is warm and dry. Capillary refill takes less than 2 seconds.  Patient with swelling, redness, warmth to left hand and wrist  Psychiatric: She has a normal mood and affect.  Nursing note and vitals reviewed.    ED Treatments / Results  Labs (all labs ordered are listed, but only abnormal results are displayed) Labs Reviewed  BASIC METABOLIC PANEL - Abnormal; Notable for the following components:      Result Value   Glucose, Bld 150 (*)    Calcium 8.7 (*)    All other components within normal limits  CBC - Abnormal; Notable for the following components:   WBC 14.0 (*)    Hemoglobin 11.7 (*)    MCH 24.3 (*)    All other components within normal limits  D-DIMER, QUANTITATIVE (NOT AT [facility]) - Abnormal; Notable for the following components:   D-Dimer, Quant 3.58 (*)    All other components within normal limits  C-REACTIVE PROTEIN - Abnormal; Notable for the following components:   CRP 27.7 (*)    All other components within normal limits  SEDIMENTATION RATE - Abnormal; Notable for the following components:   Sed Rate 84 (*)    All other components within normal limits  CULTURE, BLOOD (ROUTINE X 2)  CULTURE, BLOOD (ROUTINE X 2)   URINALYSIS, ROUTINE W REFLEX MICROSCOPIC  I-STAT TROPONIN, ED  I-STAT CG4 LACTIC ACID, ED    EKG EKG Interpretation  Date/Time:  Monday November 30 2017 11:35:31 EDT Ventricular Rate:  103 PR Interval:  146 QRS Duration: 96 QT Interval:  362 QTC Calculation: 474 R Axis:   8 Text Interpretation:  Sinus tachycardia Otherwise normal ECG Confirmed by Lennice Sites [phone]) on 11/30/2017 1:55:29 PM   Radiology Ct Angio Chest Pe W/cm &/or Wo Cm  Result Date: 11/30/2017 CLINICAL DATA:  Left arm swelling beginning on Monday. Shortness of breath with exertion. EXAM: CT ANGIOGRAPHY CHEST WITH CONTRAST TECHNIQUE: Multidetector CT imaging of the chest was performed using the standard protocol during bolus administration of intravenous contrast. Multiplanar CT image reconstructions and MIPs were obtained to evaluate the vascular anatomy. CONTRAST:  111m ISOVUE-370 IOPAMIDOL (ISOVUE-370) INJECTION 76% COMPARISON:  01/20/2017 FINDINGS: Cardiovascular: Pulmonary arterial opacification is excellent. There are no pulmonary emboli. There is aortic atherosclerosis. The heart is mildly enlarged. Mediastinum/Nodes: No mediastinal or hilar mass or lymphadenopathy. Lungs/Pleura: No pulmonary parenchymal pathology.  No pleural fluid. Upper Abdomen: Negative Musculoskeletal: There is subpectoral adenopathy on the left with some calcification. This was present on the previous exam. No acute bone finding. Chronic ankylosis of the spine. Thyroid nodules/cysts as seen previously. Review of the MIP images confirms the above findings. IMPRESSION: No pulmonary emboli or acute chest pathology. Subpectoral adenopathy on the left as seen previously. Aortic Atherosclerosis (ICD10-I70.0). Electronically Signed   By: MNelson ChimesM.D.   On: 11/30/2017 20:22    Procedures .Critical Care Performed by: CLennice Sites DO Authorized by: CLennice Sites DO   Critical care provider statement:    Critical care time (minutes):  45    Critical care time was exclusive of:  Separately billable procedures and treating other patients and teaching time   Critical care was necessary to treat or prevent imminent or life-threatening deterioration of the following conditions:  Sepsis   Critical care was time spent personally by me on the following activities:  Blood draw for specimens, development of treatment plan with patient or surrogate, discussions with consultants, evaluation of  patient's response to treatment, discussions with primary provider, examination of patient, ordering and performing treatments and interventions, ordering and review of laboratory studies, ordering and review of radiographic studies, re-evaluation of patient's condition, pulse oximetry, review of old charts and vascular access procedures   I assumed direction of critical care for this patient from another provider in my specialty: no     (including critical care time)  Angiocath insertion Performed by: Lennice Sites  Consent: Verbal consent obtained. Risks and benefits: risks, benefits and alternatives were discussed Time out: Immediately prior to procedure a "time out" was called to verify the correct patient, procedure, equipment, support staff and site/side marked as required.  Preparation: Patient was prepped and draped in the usual sterile fashion.  Vein Location: right AC  Ultrasound Guided  Gauge: 18  Normal blood return and flush without difficulty Patient tolerance: Patient tolerated the procedure well with no immediate complications.   Medications Ordered in ED Medications  vancomycin (VANCOCIN) 1,500 mg in sodium chloride 0.9 % 500 mL IVPB (0 mg Intravenous Stopped 11/30/17 1852)  iopamidol (ISOVUE-370) 76 % injection (has no administration in time range)  iopamidol (ISOVUE-370) 76 % injection 100 mL (has no administration in time range)  iopamidol (ISOVUE-370) 76 % injection (has no administration in time range)  iopamidol  (ISOVUE-370) 76 % injection 100 mL (has no administration in time range)  ceFEPIme (MAXIPIME) 1 g in sodium chloride 0.9 % 100 mL IVPB (0 g Intravenous Stopped 11/30/17 1629)  lactated ringers bolus 1,000 mL (0 mLs Intravenous Stopped 11/30/17 1702)  iopamidol (ISOVUE-370) 76 % injection 100 mL (100 mLs Intravenous Contrast Given 11/30/17 1512)  fentaNYL (SUBLIMAZE) injection 50 mcg (50 mcg Intravenous Given 11/30/17 1628)     Initial Impression / Assessment and Plan / ED Course  I have reviewed the triage vital signs and the nursing notes.  Pertinent labs & imaging results that were available during my care of the patient were reviewed by me and considered in my medical decision making (see chart for details).     Tami Schmidt is an 84 year old with history of hypertension, breast cancer in remission with lymphedema of the left arm who presents to the ED with increasing swelling, pain in the left upper extremity.  Patient with mild fever upon arrival but otherwise normal vitals.  Patient already with left upper arm ultrasound negative for DVT.  Patient states that increase in pain and swelling and redness and warmth in the left upper extremity over the last 6 to 7 days.  Patient has had some fevers.  Denies any cough, sputum production, urinary symptoms, abdominal pain.  Patient has had some shortness of breath but appears to be secondary to pain.  Patient evaluated with laboratory work prior to my evaluation that was positive for d-dimer elevation.  She also with leukocytosis.  Patient otherwise unremarkable electrolytes, kidney function.  EKG shows no ischemic changes.  Patient had a CT PE study that showed no PE, pneumonia or acute pathology.  Patient mostly here for left arm pain which I believe is secondary to cellulitis.  There does not appear to be any gas or crepitus and no concern for necrotizing fasciitis.  Patient has some pain with range of motion at her wrist but normal range of motion at  the elbow.  Patient appears to have infection on the backside of the hand up to about the mid forearm.  Patient had IV cefepime and vancomycin given.  Lactic acid normal.  Blood pressure  normal.  Patient had elevation of ESR and CRP.  Hand surgery was consulted given concern for diabetic hand infection/septic joint.  Contacted radiology and they recommend as well as hand surgery for MRI of the left wrist to evaluate for effusion/osteomyelitis. Low concern for septic joint at this time however if there is large wrist effusion patient will need joint aspiration for further evaluation.  Talked with Dr. [place] Silos of hand, as well as, with radiologist on procedure call. Plan is that if large effusion in present in wrist to engage radiology for U/s or fluro guidance tap. Dr. Jonelle Sidle is aware of this plan and patient was admitted to the medicine service with patient pending MRI.  Final Clinical Impressions(s) / ED Diagnoses   Final diagnoses:  Left arm cellulitis    ED Discharge Orders    None       Lennice Sites, DO 11/30/17 [place], [place], DO 12/15/17 1121

## 2017-11-30 NOTE — Progress Notes (Signed)
Pharmacy Antibiotic Note  Tami Schmidt is a 84 y.o. female admitted on 11/30/2017 with cellulitis.  Pharmacy has been consulted for vancomycin dosing. Tmax is 100.3 and WBC is elevated at 14. SCr is WNL.   Plan: Vancomycin 1500mg  IV Q24H F/u renal fxn, C&S, clinical status and trough at [facility] F/u continuation of cefepime or other gram negative coverage  Height: 5\' 6"  (167.6 cm) Weight: 209 lb 14.1 oz (95.2 kg) IBW/kg (Calculated) : 59.3  Temp (24hrs), Avg:99.1 F (37.3 C), Min:97.8 F (36.6 C), Max:100.3 F (37.9 C)  Recent Labs  Lab 11/30/17 1212  WBC 14.0*  CREATININE 0.74    Estimated Creatinine Clearance: 62 mL/min (by C-G formula based on SCr of 0.74 mg/dL).    Allergies  Allergen Reactions  . Bactrim Swelling    SWELLING OF MOUTH/FACE.  Marland Kitchen Lisinopril Swelling    SWELLING OF MOUTH/FACE.  Marland Kitchen Vasotec Swelling    SWELLING OF MOUTH/FACE.    Antimicrobials this admission: Vanc 8/19>> Cefepime x 1 8/19  Dose adjustments this admission: N/A  Microbiology results: Pending  Thank you for allowing pharmacy to be a part of this patient's care.  Emmanuelle Coxe, Rande Lawman 11/30/2017 2:29 PM

## 2017-12-01 ENCOUNTER — Inpatient Hospital Stay (HOSPITAL_COMMUNITY): Payer: Medicare Other

## 2017-12-01 ENCOUNTER — Encounter (HOSPITAL_COMMUNITY): Payer: Self-pay | Admitting: *Deleted

## 2017-12-01 ENCOUNTER — Other Ambulatory Visit: Payer: Self-pay

## 2017-12-01 LAB — COMPREHENSIVE METABOLIC PANEL
ALBUMIN: 2.5 g/dL — AB (ref 3.5–5.0)
ALT: 14 U/L (ref 0–44)
AST: 18 U/L (ref 15–41)
Alkaline Phosphatase: 51 U/L (ref 38–126)
Anion gap: 9 (ref 5–15)
BUN: 11 mg/dL (ref 8–23)
CHLORIDE: 104 mmol/L (ref 98–111)
CO2: 24 mmol/L (ref 22–32)
Calcium: 7.8 mg/dL — ABNORMAL LOW (ref 8.9–10.3)
Creatinine, Ser: 0.68 mg/dL (ref 0.44–1.00)
GFR calc Af Amer: >60 mL/min (ref >60)
GFR calc non Af Amer: >60 mL/min (ref >60)
Glucose, Bld: 95 mg/dL (ref 70–99)
POTASSIUM: 3.1 mmol/L — AB (ref 3.5–5.1)
SODIUM: 137 mmol/L (ref 135–145)
Total Bilirubin: 0.7 mg/dL (ref 0.3–1.2)
Total Protein: 6 g/dL — ABNORMAL LOW (ref 6.5–8.1)

## 2017-12-01 LAB — BLOOD CULTURE ID PANEL (REFLEXED)
ACINETOBACTER BAUMANNII: NOT DETECTED
CANDIDA PARAPSILOSIS: NOT DETECTED
Candida albicans: NOT DETECTED
Candida glabrata: NOT DETECTED
Candida krusei: NOT DETECTED
Candida tropicalis: NOT DETECTED
ENTEROCOCCUS SPECIES: NOT DETECTED
Enterobacter cloacae complex: NOT DETECTED
Enterobacteriaceae species: NOT DETECTED
Escherichia coli: NOT DETECTED
HAEMOPHILUS INFLUENZAE: NOT DETECTED
KLEBSIELLA OXYTOCA: NOT DETECTED
Klebsiella pneumoniae: NOT DETECTED
Listeria monocytogenes: NOT DETECTED
METHICILLIN RESISTANCE: NOT DETECTED
NEISSERIA MENINGITIDIS: NOT DETECTED
PSEUDOMONAS AERUGINOSA: NOT DETECTED
Proteus species: NOT DETECTED
SERRATIA MARCESCENS: NOT DETECTED
STAPHYLOCOCCUS AUREUS BCID: DETECTED — AB
STREPTOCOCCUS PNEUMONIAE: NOT DETECTED
STREPTOCOCCUS SPECIES: NOT DETECTED
Staphylococcus species: DETECTED — AB
Streptococcus agalactiae: NOT DETECTED
Streptococcus pyogenes: NOT DETECTED

## 2017-12-01 LAB — SYNOVIAL CELL COUNT + DIFF, W/ CRYSTALS
Crystals, Fluid: NONE SEEN
EOSINOPHILS-SYNOVIAL: 0 % (ref 0–1)
Lymphocytes-Synovial Fld: 20 % (ref 0–20)
MONOCYTE-MACROPHAGE-SYNOVIAL FLUID: 22 % — AB (ref 50–90)
Neutrophil, Synovial: 58 % — ABNORMAL HIGH (ref 0–25)
WBC, SYNOVIAL: 6050 /mm3 — AB (ref 0–200)

## 2017-12-01 LAB — CBC
HEMATOCRIT: 34.2 % — AB (ref 36.0–46.0)
Hemoglobin: 10.8 g/dL — ABNORMAL LOW (ref 12.0–15.0)
MCH: 24.4 pg — AB (ref 26.0–34.0)
MCHC: 31.6 g/dL (ref 30.0–36.0)
MCV: 77.2 fL — AB (ref 78.0–100.0)
PLATELETS: 294 10*3/uL (ref 150–400)
RBC: 4.43 MIL/uL (ref 3.87–5.11)
RDW: 15.3 % (ref 11.5–15.5)
WBC: 14.4 10*3/uL — AB (ref 4.0–10.5)

## 2017-12-01 LAB — GLUCOSE, CAPILLARY
GLUCOSE-CAPILLARY: 88 mg/dL (ref 70–99)
GLUCOSE-CAPILLARY: 186 mg/dL — AB (ref 70–99)
Glucose-Capillary: 114 mg/dL — ABNORMAL HIGH (ref 70–99)
Glucose-Capillary: 114 mg/dL — ABNORMAL HIGH (ref 70–99)
Glucose-Capillary: 124 mg/dL — ABNORMAL HIGH (ref 70–99)
Glucose-Capillary: 137 mg/dL — ABNORMAL HIGH (ref 70–99)
Glucose-Capillary: 58 mg/dL — ABNORMAL LOW (ref 70–99)

## 2017-12-01 LAB — SYNOVIAL FLUID, CRYSTAL: CRYSTALS FLUID: NONE SEEN

## 2017-12-01 LAB — URIC ACID: URIC ACID, SERUM: 4.2 mg/dL (ref 2.5–7.1)

## 2017-12-01 MED ORDER — ASPIRIN EC 81 MG PO TBEC
81.0000 mg | DELAYED_RELEASE_TABLET | Freq: Every day | ORAL | Status: DC
  Administered 2017-12-01 – 2017-12-08 (×8): 81 mg via ORAL
  Filled 2017-12-01 (×8): qty 1

## 2017-12-01 MED ORDER — MELOXICAM 7.5 MG PO TABS
15.0000 mg | ORAL_TABLET | Freq: Every day | ORAL | Status: DC
  Administered 2017-12-01 – 2017-12-08 (×8): 15 mg via ORAL
  Filled 2017-12-01 (×8): qty 2

## 2017-12-01 MED ORDER — PANTOPRAZOLE SODIUM 40 MG PO TBEC
40.0000 mg | DELAYED_RELEASE_TABLET | Freq: Every day | ORAL | Status: DC
  Administered 2017-12-02 – 2017-12-08 (×7): 40 mg via ORAL
  Filled 2017-12-01 (×8): qty 1

## 2017-12-01 MED ORDER — SODIUM CHLORIDE 0.9 % IV SOLN
INTRAVENOUS | Status: AC
  Administered 2017-12-01: 09:00:00 via INTRAVENOUS

## 2017-12-01 MED ORDER — LIDOCAINE HCL (PF) 1 % IJ SOLN
INTRAMUSCULAR | Status: AC
  Filled 2017-12-01: qty 30

## 2017-12-01 MED ORDER — COLCHICINE 0.6 MG PO TABS
0.6000 mg | ORAL_TABLET | Freq: Two times a day (BID) | ORAL | Status: DC
  Administered 2017-12-01 – 2017-12-02 (×3): 0.6 mg via ORAL
  Filled 2017-12-01 (×3): qty 1

## 2017-12-01 MED ORDER — DEXTROSE 50 % IV SOLN
INTRAVENOUS | Status: AC
  Administered 2017-12-01: 25 mL
  Filled 2017-12-01: qty 50

## 2017-12-01 NOTE — Consult Note (Addendum)
Reason for Consult:Left hand/wrist cellulitis Referring Physician: A Curatolo  Tami Schmidt is an 84 y.o. female.  HPI: Tami Schmidt began to develop left hand pain last Tuesday. It was relatively mild until Thursday when it got much worse. She had been trying to manage at home but finally came to the ED for evaluation. She denies fevers, chills, sweats, N/V, N/T at home but has a fever here. She's also had some SOB that she's been attributing to the pain. She's been using ibuprofen for pain control with some success. She denies prior hx/o similar or any trauma to hand.  Overnight, she reports that she hasn't markedly improved, but has not worsened either.  MRI c/w some fluid along flexor and extensor tendons at the level of the wrist.  Past Medical History:  Diagnosis Date  . Arthritis   . Breast cancer ([place])    b/l mastectomies hx  . Cancer [facility])    breast  . 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 Diabetes mellitus    fasting 90-100  . HTN (hypertension) 03/25/2013  . Hx of radiation therapy    breasts hx  . Hypercholesterolemia   . Hypertension   . Lymphedema of arm    left arm  . Type II or unspecified type diabetes mellitus without mention of complication, not stated as uncontrolled 03/25/2013    Past Surgical History:  Procedure Laterality Date  . ABDOMINAL HYSTERECTOMY    . ANKLE SURGERY Right 1995  . APPENDECTOMY    . BREAST SURGERY Bilateral    mastectomy  . HERNIA REPAIR  04-26-2010  . MASS EXCISION Left 01/26/2013   Procedure: EXCISION LEFT CHEST WALL MASS AND LEFT ABDOMNAL WALL MASS;  Surgeon: Harl Bowie, MD;  Location: [place];  Service: General;  Laterality: Left;  Marland Kitchen MASS EXCISION Left 09/20/2014   Procedure: EXCISION OF LEFT CHEST WALL MASS;  Surgeon: Coralie Keens, MD;  Location: [place];  Service: General;  Laterality: Left;  . TOTAL HIP ARTHROPLASTY Right 01/05/2017  . TOTAL HIP ARTHROPLASTY Right 01/05/2017   Procedure:  TOTAL HIP ARTHROPLASTY ANTERIOR APPROACH;  Surgeon: Frederik Pear, MD;  Location: [place];  Service: Orthopedics;  Laterality: Right;    No family history on file.  Social History:  reports that she quit smoking about 45 years ago. Her smoking use included cigarettes. She has never used smokeless tobacco. She reports that she drinks alcohol. She reports that she does not use drugs.  Allergies:  Allergies  Allergen Reactions  . Bactrim Swelling    SWELLING OF MOUTH/FACE.  Marland Kitchen Lisinopril Swelling    SWELLING OF MOUTH/FACE.  Marland Kitchen Vasotec Swelling    SWELLING OF MOUTH/FACE.    Medications: I have reviewed the patient's current medications.  Results for orders placed or performed during the hospital encounter of 11/30/17 (from the past 48 hour(s))  Basic metabolic panel     Status: Abnormal   Collection Time: 11/30/17 12:12 PM  Result Value Ref Range   Sodium 135 135 - 145 mmol/L   Potassium 3.8 3.5 - 5.1 mmol/L   Chloride 99 98 - 111 mmol/L   CO2 22 22 - 32 mmol/L   Glucose, Bld 150 (H) 70 - 99 mg/dL   BUN 10 8 - 23 mg/dL   Creatinine, Ser 0.74 0.44 - 1.00 mg/dL   Calcium 8.7 (L) 8.9 - 10.3 mg/dL   GFR calc non Af Amer >60 >60 mL/min   GFR calc Af Amer >60 >60 mL/min  Comment: (NOTE) The eGFR has been calculated using the CKD EPI equation. This calculation has not been validated in all clinical situations. eGFR's persistently <60 mL/min signify possible Chronic Kidney Disease.    Anion gap 14 5 - 15    Comment: Performed at [facility] [address] [place], [place] [address]  CBC     Status: Abnormal   Collection Time: 11/30/17 12:12 PM  Result Value Ref Range   WBC 14.0 (H) 4.0 - 10.5 K/uL   RBC 4.82 3.87 - 5.11 MIL/uL   Hemoglobin 11.7 (L) 12.0 - 15.0 g/dL   HCT 38.1 36.0 - 46.0 %   MCV 79.0 78.0 - 100.0 fL   MCH 24.3 (L) 26.0 - 34.0 pg   MCHC 30.7 30.0 - 36.0 g/dL   RDW 15.1 11.5 - 15.5 %   Platelets 331 150 - 400 K/uL    Comment: Performed at [facility] [address], [place], [place] [address]  D-dimer, quantitative     Status: Abnormal   Collection Time: 11/30/17 12:12 PM  Result Value Ref Range   D-Dimer, Quant 3.58 (H) 0.00 - 0.50 ug/mL-FEU    Comment: (NOTE) At the manufacturer cut-off of 0.50 ug/mL FEU, this assay has been documented to exclude PE with a sensitivity and negative predictive value of 97 to 99%.  At this time, this assay has not been approved by the FDA to exclude DVT/VTE. Results should be correlated with clinical presentation. Performed at [facility], [place] [address], [place], [place] [address]   Sedimentation rate     Status: Abnormal   Collection Time: 11/30/17 12:12 PM  Result Value Ref Range   Sed Rate 84 (H) 0 - 22 mm/hr    Comment: Performed at [facility] [address] [place], [place] [address]  I-stat troponin, ED     Status: None   Collection Time: 11/30/17 12:16 PM  Result Value Ref Range   Troponin i, poc 0.02 0.00 - 0.08 ng/mL   Comment 3            Comment: Due to the release kinetics of cTnI, a negative result within the first hours of the onset of symptoms does not rule out myocardial infarction with certainty. If myocardial infarction is still suspected, repeat the test at appropriate intervals.   I-Stat CG4 Lactic Acid, ED     Status: None   Collection Time: 11/30/17  2:40 PM  Result Value Ref Range   Lactic Acid, Venous 1.78 0.5 - 1.9 mmol/L  C-reactive protein     Status: Abnormal   Collection Time: 11/30/17  2:51 PM  Result Value Ref Range   CRP 27.7 (H) <1.0 mg/dL    Comment: Performed at [facility], [place] [address], [place], [place] [address]  Glucose, capillary     Status: Abnormal   Collection Time: 11/30/17 11:47 PM  Result Value Ref Range   Glucose-Capillary 58 (L) 70 - 99 mg/dL  Glucose, capillary     Status: Abnormal   Collection Time: 12/01/17 12:22 AM  Result Value Ref Range   Glucose-Capillary 114 (H) 70 - 99 mg/dL  Glucose,  capillary     Status: None   Collection Time: 12/01/17  6:47 AM  Result Value Ref Range   Glucose-Capillary 88 70 - 99 mg/dL    Ct Angio Chest Pe W/cm &/or Wo Cm  Result Date: 11/30/2017 CLINICAL DATA:  Left arm swelling beginning  on Monday. Shortness of breath with exertion. EXAM: CT ANGIOGRAPHY CHEST WITH CONTRAST TECHNIQUE: Multidetector CT imaging of the chest was performed using the standard protocol during bolus administration of intravenous contrast. Multiplanar CT image reconstructions and MIPs were obtained to evaluate the vascular anatomy. CONTRAST:  162m ISOVUE-370 IOPAMIDOL (ISOVUE-370) INJECTION 76% COMPARISON:  01/20/2017 FINDINGS: Cardiovascular: Pulmonary arterial opacification is excellent. There are no pulmonary emboli. There is aortic atherosclerosis. The heart is mildly enlarged. Mediastinum/Nodes: No mediastinal or hilar mass or lymphadenopathy. Lungs/Pleura: No pulmonary parenchymal pathology.  No pleural fluid. Upper Abdomen: Negative Musculoskeletal: There is subpectoral adenopathy on the left with some calcification. This was present on the previous exam. No acute bone finding. Chronic ankylosis of the spine. Thyroid nodules/cysts as seen previously. Review of the MIP images confirms the above findings. IMPRESSION: No pulmonary emboli or acute chest pathology. Subpectoral adenopathy on the left as seen previously. Aortic Atherosclerosis (ICD10-I70.0). Electronically Signed   By: MNelson ChimesM.D.   On: 11/30/2017 20:22   Mr Wrist Left Wo Contrast  Result Date: 11/30/2017 CLINICAL DATA:  84year old female with history of breast cancer and lymphedema of the left upper extremity presents with pain and swelling of the left hand and wrist. EXAM: MR OF THE LEFT WRIST WITHOUT CONTRAST TECHNIQUE: Multiplanar, multisequence MR imaging of the left wrist was performed. No intravenous contrast was administered. COMPARISON:  None. FINDINGS: Limited exam due to patient body habitus and  inability maintain adequate positioning of the left wrist. Ligaments: Intact scapholunate and lunotriquetral ligaments. Triangular fibrocartilage: Intact TFCC. Tendons: Extensive tenosynovitis noted with fluid outlining the flexor extensor tendons of the wrist. Carpal tunnel/median nerve: Intact Guyon's canal: Intact Joint/cartilage: No focal chondral defects. Osteoarthritic joint space narrowing of the radiocarpal joint with chondral thinning suggested. Trace fluid in the distal radioulnar joint without definite tear of the triangular fibrocartilage. Small radiocarpal and midcarpal joint effusions. Bones/carpal alignment: Scattered erosions of the proximal and distal carpal rows. No frank bone destruction or fracture. Other: Nonspecific diffuse soft tissue swelling of the included distal forearm and wrist possibly associated with the patient's underlying history of lymphedema. Cellulitis is the for intra possibility. IMPRESSION: Joint effusions involving the distal radioulnar, radiocarpal and midcarpal joints. Scattered carpal erosions are noted. This in conjunction with extensive tenosynovitis may be seen with inflammatory arthropathy such as rheumatoid. No acute osseous abnormality given limitations due to patient body habitus and inability to position adequately for routine imaging. Electronically Signed   By: DAshley RoyaltyM.D.   On: 11/30/2017 23:39    Review of Systems  Constitutional: Negative for weight loss.  HENT: Negative for ear discharge, ear pain, hearing loss and tinnitus.   Eyes: Negative for blurred vision, double vision, photophobia and pain.  Respiratory: Negative for cough, sputum production and shortness of breath.   Cardiovascular: Negative for chest pain.  Gastrointestinal: Negative for abdominal pain, nausea and vomiting.  Genitourinary: Negative for dysuria, flank pain, frequency and urgency.  Musculoskeletal: Positive for joint pain (Left hand/wrist). Negative for back pain,  falls, myalgias and neck pain.  Neurological: Negative for dizziness, tingling, sensory change, focal weakness, loss of consciousness and headaches.  Endo/Heme/Allergies: Does not bruise/bleed easily.  Psychiatric/Behavioral: Negative for depression, memory loss and substance abuse. The patient is not nervous/anxious.    Blood pressure (!) 158/69, pulse 88, temperature 99.9 F (37.7 C), temperature source Oral, resp. rate 16, height _0  (1.676 m), weight 95.2 kg, SpO2 92 %.  EXAM: Left hand and distal forearm is found lying  beside her, swollen, not elevated.  It is warm. The hand and distal forearm is swollen, and she reports it being more than her baseline lymphedema.  There is pain with AROM and PROM of wrist, but better wrist PROM than AROM.  With the wrist supported in neutral, there is pain with active > passive flexion and extension of the digits.  There is pain with palpation of the edematous dorsal hand, all along the MPJs, and even into the fingers.   Assessment/Plan: Left hand/wrist inflammation It is unclear whether this is infectious in etiology, or just non-infectious inflammation.  The tissues that are inflamed appear more diffuse than would be typical for septic arthritis of the wrist.   I recommend US guided sampling of the fluid for analysis (Gm stain, culture, crystal analysis).  If infectious, will need formal I&D in OR.  If not, then will likely not benefit from surgery involvement.    Have started meloxicam, colchicine, and I see that uric acid is ordered.  I encouraged her to really keep her hand elevated.  Will follow as more data becomes evident to see if there is a role for my continued involvement in her care.  Please keep NPO until gram stain from aspiration is available so that she may progress to surgery if needed.  Micheline Rough, MD Hand Surgery

## 2017-12-01 NOTE — Progress Notes (Signed)
Hypoglycemic Event  CBG: 58  Treatment:  4 oz.  Orange Juice  Symptoms: none  Follow-up CBG: Time:0020 CBG Result:114  Possible Reasons for Event: Patient was NPO all day in the ED.  Comments/MD notified: None    Tami Schmidt A Vincient Vanaman

## 2017-12-01 NOTE — Progress Notes (Signed)
US guided aspiration of fluid collection along flexor tendons at carpus negative for crystals, Gram stain with WBC & no organisms, WBC only 6K.  Serum uric acid 4.2, ESR 84, WBC 14s  Does not seem to be infectious etiology, but will await culture results x 48 hours.  At present, evidence points more to rheumatologic problem than hand surgery problem.  Recommend elevation, NSAIDs, empiric antibiotics in interim. May try steroids if no growth on culture at 48 hours. No need to remain NPO.  Micheline Rough, MD Hand Surgery

## 2017-12-01 NOTE — Progress Notes (Addendum)
PHARMACY - PHYSICIAN COMMUNICATION CRITICAL VALUE ALERT - BLOOD CULTURE IDENTIFICATION (BCID)  Tami Schmidt is an 84 y.o. female who presented to [facility] on 11/30/2017 with a chief complaint of pain and swelling of L hand/wrist.   Assessment:  S/p joint aspiration by IR today - possibly inflammatory arthritis. WBC 14, afebrile, LA 1.78, CRP 27.7, sed rate 84. 1/4 blood culture (aerobic bottle) growing coag-negative staph (mecA NOT detected) - likely contaminant.  Name of physician (or Provider) Contacted: Dr. Maudie Mercury  Current antibiotics: Vancomycin/Cefepime  Changes to prescribed antibiotics recommended:  No changes to antibiotics based on blood cultures- plan per notes to consider de-escalation on antibiotics if cultures remain negative  Results for orders placed or performed during the hospital encounter of 11/30/17  Blood Culture ID Panel (Reflexed) (Collected: 11/30/2017  2:51 PM)  Result Value Ref Range   Enterococcus species NOT DETECTED NOT DETECTED   Listeria monocytogenes NOT DETECTED NOT DETECTED   Staphylococcus species DETECTED (A) NOT DETECTED   Staphylococcus aureus DETECTED (A) NOT DETECTED   Methicillin resistance NOT DETECTED NOT DETECTED   Streptococcus species NOT DETECTED NOT DETECTED   Streptococcus agalactiae NOT DETECTED NOT DETECTED   Streptococcus pneumoniae NOT DETECTED NOT DETECTED   Streptococcus pyogenes NOT DETECTED NOT DETECTED   Acinetobacter baumannii NOT DETECTED NOT DETECTED   Enterobacteriaceae species NOT DETECTED NOT DETECTED   Enterobacter cloacae complex NOT DETECTED NOT DETECTED   Escherichia coli NOT DETECTED NOT DETECTED   Klebsiella oxytoca NOT DETECTED NOT DETECTED   Klebsiella pneumoniae NOT DETECTED NOT DETECTED   Proteus species NOT DETECTED NOT DETECTED   Serratia marcescens NOT DETECTED NOT DETECTED   Haemophilus influenzae NOT DETECTED NOT DETECTED   Neisseria meningitidis NOT DETECTED NOT DETECTED   Pseudomonas aeruginosa NOT  DETECTED NOT DETECTED   Candida albicans NOT DETECTED NOT DETECTED   Candida glabrata NOT DETECTED NOT DETECTED   Candida krusei NOT DETECTED NOT DETECTED   Candida parapsilosis NOT DETECTED NOT DETECTED   Candida tropicalis NOT DETECTED NOT DETECTED    Doylene Canard, PharmD Clinical Pharmacist  Pager: [phone] Phone: [phone]  ADDENDUM BCID update: results positive for 1/4 staph aureus (MSSA) - incorrectly labeled as coag-negative staph in note yesterday. Antibiotics to be adjusted to cefazolin and ID consulted.   Doylene Canard, PharmD Clinical Pharmacist  12/01/2017  8:12 PM

## 2017-12-01 NOTE — Progress Notes (Signed)
@IPLOG @        PROGRESS NOTE                                                                                                                                                                                                             Patient Demographics:    Tami Schmidt, is a 84 y.o. female, DOB - [birth year], [id number]  Admit date - 11/30/2017   Admitting Physician Elwyn Reach, MD  Outpatient Primary MD for the patient is Donald Prose, MD  LOS - 1  Chief Complaint  Patient presents with  . Hand Pain       Brief Narrative  Tami Schmidt is a 84 y.o. female with medical history significant of breast cancer status post treatment with recurrent lymphedema in the left upper extremity, patient had bilateral mastectomy, history of diabetes, hypertension and hyperlipidemia who presented to the ER with pain swelling of the left hand and wrist over the last 1 week.    Subjective:    [employer] today has, No headache, No chest pain, No abdominal pain - No Nausea, No new weakness tingling or numbness, No Cough - SOB.+ve L.wrist pain.   Assessment  & Plan :     1.  Left wrist pain ongoing for the last 1 week.  No previous history of arthritis.  No injuries.  MRI of the left wrist unremarkable except for joint effusion, joint aspirated by IR today, so far the changes are suggestive of inflammatory arthritis, no crystals reported, pending Gram stain and culture, clinically she is better with colchicine and NSAID.  Continue the same along with empiric antibiotics, if Gram stain negative and no cultures for 48 hours I think a trial of steroids can be done if cleared by hand surgery.  Hand surgery on board.  2.  Lymphedema of left more than right arm.  Related to previous bilateral mastectomies.  No warmth.  Do not think there is any cellulitis.  3.  DM type II.  On SSI will continue to monitor.  CBG (last 3)  Recent Labs    12/01/17 0647 12/01/17 0928 12/01/17 1152  GLUCAP 88  137* 114*   4.  Hypertension.  On Norvasc & stable.  5.  GERD.  On PPI.    Diet :  Diet Order            DIET SOFT Room service appropriate? Yes; Fluid consistency: Thin  Diet effective now  Family Communication  :  None present  Code Status : Full  Disposition Plan  :  Likely home in 1-2 days  Consults  :  Hand Surg  Procedures  :    O.JJKKX joint aspiration done by IR on 12/01/2017.  Fluid looks inflammatory so far.  No crystals.  Follow Gram stain and culture.  DVT Prophylaxis  :    Heparin    Lab Results  Component Value Date   PLT 294 12/01/2017    Inpatient Medications  Scheduled Meds: . amLODipine  10 mg Oral Q breakfast  . aspirin EC  81 mg Oral Daily  . colchicine  0.6 mg Oral BID  . heparin  5,000 Units Subcutaneous Q8H  . insulin aspart  0-15 Units Subcutaneous TID WC  . insulin aspart  0-5 Units Subcutaneous QHS  . insulin NPH Human  40 Units Subcutaneous Q breakfast  . lidocaine (PF)      . meloxicam  15 mg Oral Q breakfast  . omega-3 acid ethyl esters  1 g Oral Q breakfast  . pantoprazole  40 mg Oral Daily  . tamoxifen  20 mg Oral Daily   Continuous Infusions: . sodium chloride 50 mL/hr at 12/01/17 0841  . ceFEPime (MAXIPIME) IV 1 g (12/01/17 1405)  . vancomycin Stopped (11/30/17 1852)   PRN Meds:.iopamidol, iopamidol, ketorolac, morphine injection  Antibiotics  :    Anti-infectives (From admission, onward)   Start     Dose/Rate Route Frequency Ordered Stop   11/30/17 2300  ceFEPIme (MAXIPIME) 1 g in sodium chloride 0.9 % 100 mL IVPB     1 g 200 mL/hr over 30 Minutes Intravenous Every 8 hours 11/30/17 2242     11/30/17 2300  vancomycin (VANCOCIN) IVPB 750 mg/150 ml premix  Status:  Discontinued     750 mg 150 mL/hr over 60 Minutes Intravenous 2 times daily 11/30/17 2242 11/30/17 2243   11/30/17 1430  ceFEPIme (MAXIPIME) 1 g in sodium chloride 0.9 % 100 mL IVPB     1 g 200 mL/hr over 30 Minutes Intravenous  Once  11/30/17 1424 11/30/17 1629   11/30/17 1430  vancomycin (VANCOCIN) 1,500 mg in sodium chloride 0.9 % 500 mL IVPB     1,500 mg 250 mL/hr over 120 Minutes Intravenous Every 24 hours 11/30/17 1427           Objective:   Vitals:   11/30/17 2130 11/30/17 2315 12/01/17 0612 12/01/17 1405  BP: (!) 156/85 (!) 149/84 (!) 158/69 139/65  Pulse: 98 100 88 83  Resp: 19 (!) 21 16 16   Temp:  99.5 F (37.5 C) 99.9 F (37.7 C) 98.9 F (37.2 C)  TempSrc:  Oral Oral Oral  SpO2:  94% 92% 100%  Weight:      Height:        Wt Readings from Last 3 Encounters:  11/30/17 95.2 kg  02/09/17 95.2 kg  01/05/17 97.1 kg     Intake/Output Summary (Last 24 hours) at 12/01/2017 1407 Last data filed at 12/01/2017 1000 Gross per 24 hour  Intake 1753.28 ml  Output -  Net 1753.28 ml     Physical Exam  Awake Alert, Oriented X 3, No new F.N deficits, Normal affect [place].AT,PERRAL Supple Neck,No JVD, No cervical lymphadenopathy appriciated.  Symmetrical Chest wall movement, Good air movement bilaterally, CTAB RRR,No Gallops,Rubs or new Murmurs, No Parasternal Heave +ve B.Sounds, Abd Soft, No tenderness, No organomegaly appriciated, No rebound - guarding or rigidity. No Cyanosis, Clubbing or  edema, left wrist along with several digits are swollen, painful range of motion both active and passive.    Data Review:    CBC Recent Labs  Lab 11/30/17 1212 12/01/17 0756  WBC 14.0* 14.4*  HGB 11.7* 10.8*  HCT 38.1 34.2*  PLT 331 294  MCV 79.0 77.2*  MCH 24.3* 24.4*  MCHC 30.7 31.6  RDW 15.1 15.3    Chemistries  Recent Labs  Lab 11/30/17 1212 12/01/17 0756  NA 135 137  K 3.8 3.1*  CL 99 104  CO2 22 24  GLUCOSE 150* 95  BUN 10 11  CREATININE 0.74 0.68  CALCIUM 8.7* 7.8*  AST  --  18  ALT  --  14  ALKPHOS  --  51  BILITOT  --  0.7   ------------------------------------------------------------------------------------------------------------------ No results for input(s): CHOL, HDL,  LDLCALC, TRIG, CHOLHDL, LDLDIRECT in the last 72 hours.  Lab Results  Component Value Date   HGBA1C 6.0 (H) 01/05/2017   ------------------------------------------------------------------------------------------------------------------ No results for input(s): TSH, T4TOTAL, T3FREE, THYROIDAB in the last 72 hours.  Invalid input(s): FREET3 ------------------------------------------------------------------------------------------------------------------ No results for input(s): VITAMINB12, FOLATE, FERRITIN, TIBC, IRON, RETICCTPCT in the last 72 hours.  Coagulation profile No results for input(s): INR, PROTIME in the last 168 hours.  Recent Labs    11/30/17 1212  DDIMER 3.58*    Cardiac Enzymes No results for input(s): CKMB, TROPONINI, MYOGLOBIN in the last 168 hours.  Invalid input(s): CK ------------------------------------------------------------------------------------------------------------------ No results found for: BNP  Micro Results No results found for this or any previous visit (from the past 240 hour(s)).  Radiology Reports Ct Angio Chest Pe W/cm &/or Wo Cm  Result Date: 11/30/2017 CLINICAL DATA:  Left arm swelling beginning on Monday. Shortness of breath with exertion. EXAM: CT ANGIOGRAPHY CHEST WITH CONTRAST TECHNIQUE: Multidetector CT imaging of the chest was performed using the standard protocol during bolus administration of intravenous contrast. Multiplanar CT image reconstructions and MIPs were obtained to evaluate the vascular anatomy. CONTRAST:  14mL ISOVUE-370 IOPAMIDOL (ISOVUE-370) INJECTION 76% COMPARISON:  01/20/2017 FINDINGS: Cardiovascular: Pulmonary arterial opacification is excellent. There are no pulmonary emboli. There is aortic atherosclerosis. The heart is mildly enlarged. Mediastinum/Nodes: No mediastinal or hilar mass or lymphadenopathy. Lungs/Pleura: No pulmonary parenchymal pathology.  No pleural fluid. Upper Abdomen: Negative Musculoskeletal:  There is subpectoral adenopathy on the left with some calcification. This was present on the previous exam. No acute bone finding. Chronic ankylosis of the spine. Thyroid nodules/cysts as seen previously. Review of the MIP images confirms the above findings. IMPRESSION: No pulmonary emboli or acute chest pathology. Subpectoral adenopathy on the left as seen previously. Aortic Atherosclerosis (ICD10-I70.0). Electronically Signed   By: Nelson Chimes M.D.   On: 11/30/2017 20:22   US Guided Needle Placement  Result Date: 12/01/2017 INDICATION: Tenosynovitis of left hand and wrist. EXAM: Aspiration of flexor tendon sheath at the left wrist. MEDICATIONS: The patient is currently admitted to the hospital and receiving intravenous antibiotics. The antibiotics were administered within an appropriate time frame prior to the initiation of the procedure. ANESTHESIA/SEDATION: Lidocaine 1% local anesthetic. COMPLICATIONS: None immediate. PROCEDURE: Informed written consent was obtained from the patient after a thorough discussion of the procedural risks, benefits and alternatives. All questions were addressed. A timeout was performed prior to the initiation of the procedure. Using sterile technique and local anesthesia, I inserted an 18 gauge needle into a flexor tendon sheath in the distal left forearm using direct ultrasound guidance to avoid the vessels and tendons. The needle was  placed into a tendon sheath and 1 cc of cloudy yellow fluid was aspirated without difficulty. The patient tolerated the procedure well. There were no immediate complications. IMPRESSION: Tendon sheath aspiration of the distal left forearm just above the radiocarpal joint was performed without immediate complication. Laboratory reports to follow. Electronically Signed   By: Lorriane Shire M.D.   On: 12/01/2017 12:22   Mr Wrist Left Wo Contrast  Result Date: 11/30/2017 CLINICAL DATA:  84 year old female with history of breast cancer and  lymphedema of the left upper extremity presents with pain and swelling of the left hand and wrist. EXAM: MR OF THE LEFT WRIST WITHOUT CONTRAST TECHNIQUE: Multiplanar, multisequence MR imaging of the left wrist was performed. No intravenous contrast was administered. COMPARISON:  None. FINDINGS: Limited exam due to patient body habitus and inability maintain adequate positioning of the left wrist. Ligaments: Intact scapholunate and lunotriquetral ligaments. Triangular fibrocartilage: Intact TFCC. Tendons: Extensive tenosynovitis noted with fluid outlining the flexor extensor tendons of the wrist. Carpal tunnel/median nerve: Intact Guyon's canal: Intact Joint/cartilage: No focal chondral defects. Osteoarthritic joint space narrowing of the radiocarpal joint with chondral thinning suggested. Trace fluid in the distal radioulnar joint without definite tear of the triangular fibrocartilage. Small radiocarpal and midcarpal joint effusions. Bones/carpal alignment: Scattered erosions of the proximal and distal carpal rows. No frank bone destruction or fracture. Other: Nonspecific diffuse soft tissue swelling of the included distal forearm and wrist possibly associated with the patient's underlying history of lymphedema. Cellulitis is the for intra possibility. IMPRESSION: Joint effusions involving the distal radioulnar, radiocarpal and midcarpal joints. Scattered carpal erosions are noted. This in conjunction with extensive tenosynovitis may be seen with inflammatory arthropathy such as rheumatoid. No acute osseous abnormality given limitations due to patient body habitus and inability to position adequately for routine imaging. Electronically Signed   By: Ashley Royalty M.D.   On: 11/30/2017 23:39    Time Spent in minutes  30   Lala Lund M.D on 12/01/2017 at 2:07 PM  To page go to [email or web address] - password [facility]

## 2017-12-02 ENCOUNTER — Other Ambulatory Visit (HOSPITAL_COMMUNITY): Payer: Medicare Other

## 2017-12-02 DIAGNOSIS — Z888 Allergy status to other drugs, medicaments and biological substances status: Secondary | ICD-10-CM

## 2017-12-02 DIAGNOSIS — R7881 Bacteremia: Secondary | ICD-10-CM | POA: Diagnosis present

## 2017-12-02 DIAGNOSIS — Z87891 Personal history of nicotine dependence: Secondary | ICD-10-CM

## 2017-12-02 DIAGNOSIS — M00032 Staphylococcal arthritis, left wrist: Secondary | ICD-10-CM

## 2017-12-02 DIAGNOSIS — I10 Essential (primary) hypertension: Secondary | ICD-10-CM

## 2017-12-02 DIAGNOSIS — Z853 Personal history of malignant neoplasm of breast: Secondary | ICD-10-CM

## 2017-12-02 DIAGNOSIS — I890 Lymphedema, not elsewhere classified: Secondary | ICD-10-CM

## 2017-12-02 DIAGNOSIS — B9561 Methicillin susceptible Staphylococcus aureus infection as the cause of diseases classified elsewhere: Secondary | ICD-10-CM

## 2017-12-02 DIAGNOSIS — M659 Synovitis and tenosynovitis, unspecified: Secondary | ICD-10-CM | POA: Diagnosis present

## 2017-12-02 LAB — URINALYSIS, ROUTINE W REFLEX MICROSCOPIC
Bilirubin Urine: NEGATIVE
GLUCOSE, UA: NEGATIVE mg/dL
KETONES UR: NEGATIVE mg/dL
LEUKOCYTES UA: NEGATIVE
NITRITE: NEGATIVE
PROTEIN: NEGATIVE mg/dL
Specific Gravity, Urine: 1.017 (ref 1.005–1.030)
pH: 6 (ref 5.0–8.0)

## 2017-12-02 LAB — GLUCOSE, CAPILLARY
GLUCOSE-CAPILLARY: 102 mg/dL — AB (ref 70–99)
Glucose-Capillary: 104 mg/dL — ABNORMAL HIGH (ref 70–99)
Glucose-Capillary: 82 mg/dL (ref 70–99)
Glucose-Capillary: 111 mg/dL — ABNORMAL HIGH (ref 70–99)

## 2017-12-02 MED ORDER — CEFAZOLIN SODIUM-DEXTROSE 2-4 GM/100ML-% IV SOLN
2.0000 g | Freq: Three times a day (TID) | INTRAVENOUS | Status: DC
  Administered 2017-12-02 – 2017-12-08 (×19): 2 g via INTRAVENOUS
  Filled 2017-12-02 (×20): qty 100

## 2017-12-02 MED ORDER — ACETAMINOPHEN 325 MG PO TABS
650.0000 mg | ORAL_TABLET | Freq: Four times a day (QID) | ORAL | Status: DC
  Administered 2017-12-02 – 2017-12-08 (×16): 650 mg via ORAL
  Filled 2017-12-02 (×20): qty 2

## 2017-12-02 NOTE — Progress Notes (Signed)
Pharmacy Antibiotic Note  Tami Schmidt is a 84 y.o. female admitted on 11/30/2017 with bacteremia.  Pharmacy has been consulted for cefazolin dosing.  WBC 14.4, CRP 27.7, sed rate 84, 1/4 BCx came back as MSSA per BCID. Scr 0.68 (CrCl ~62 mL/min; ~40 mL/min when adjusted for age). S/p join aspiration by IR on 8/20. Was on vancomycin and cefepime- changing to cefazolin per ID given BCID results.  Plan: Cefazolin 2 g IV every 8 hours  Monitor renal fx, cx results, clinical pic, and ID recommendations  Height: 5\' 6"  (167.6 cm) Weight: 209 lb 14.1 oz (95.2 kg) IBW/kg (Calculated) : 59.3  Temp (24hrs), Avg:99.1 F (37.3 C), Min:98.7 F (37.1 C), Max:99.7 F (37.6 C)  Recent Labs  Lab 11/30/17 1212 11/30/17 1440 12/01/17 0756  WBC 14.0*  --  14.4*  CREATININE 0.74  --  0.68  LATICACIDVEN  --  1.78  --     Estimated Creatinine Clearance: 62 mL/min (by C-G formula based on SCr of 0.68 mg/dL).    Allergies  Allergen Reactions  . Bactrim Swelling    SWELLING OF MOUTH/FACE.  Marland Kitchen Lisinopril Swelling    SWELLING OF MOUTH/FACE.  Marland Kitchen Vasotec Swelling    SWELLING OF MOUTH/FACE.    Antimicrobials this admission: Cefepime 8/19 >> 8/21 Vancomycin 8/19 >> 8/21 Cefazolin 8/21>>  Dose adjustments this admission: N/A  Microbiology results: 8/21 BCx: sent 8/19 BCx: GPC in 1/4 - BCID MSSA 8/20 L Wrist Synovial Fluid Cx: no orgs   Thank you for allowing pharmacy to be a part of this patient's care.  Doylene Canard, PharmD Clinical Pharmacist  Pager: [phone] Phone: [phone] 12/02/2017 8:51 AM

## 2017-12-02 NOTE — Consult Note (Signed)
[place] for Infectious Disease    Date of Admission:  11/30/2017   Total days of antibiotics 3        Day 1 cefazolin              Reason for Consult: Automatic consultation for MSSA bacteremia     Assessment: I suspect that she does have a early left wrist infection complicated by MSSA bacteremia.  I have narrowed antibiotic therapy to IV cefazolin and ordered repeat blood cultures and transthoracic echocardiogram.  Plan: 1. IV cefazolin 2. Repeat blood cultures 3. Hold off on PICC placement until we know blood cultures are negative 4. 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 acetaminophen  650 mg Oral Q6H  . amLODipine  10 mg Oral Q breakfast  . aspirin EC  81 mg Oral Daily  . heparin  5,000 Units Subcutaneous Q8H  . insulin aspart  0-15 Units Subcutaneous TID WC  . insulin aspart  0-5 Units Subcutaneous QHS  . insulin NPH Human  40 Units Subcutaneous Q breakfast  . meloxicam  15 mg Oral Q breakfast  . omega-3 acid ethyl esters  1 g Oral Q breakfast  . pantoprazole  40 mg Oral Daily  . tamoxifen  20 mg Oral Daily   Continuous Infusions: .  ceFAZolin (ANCEF) IV     PRN Meds:.iopamidol, iopamidol, morphine injection  HPI: [name] is a 84 y.o. female with chronic lymphedema of her left arm and hand following breast cancer surgery.  About 1 week ago she developed sudden onset of more severe swelling, pain and warmth of her left wrist and hand.  She does not recall any injury.  She was not aware of having any fever but did have shaking chills.  She was admitted on 11/30/2017 with a temperature of 100.3 degrees.  An MRI revealed joint effusions in her wrist and extensive tenosynovitis.  1 cc of cloudy  yellow fluid was aspirated from her flexor tendon sheath.  Synovial fluid analysis was fairly unremarkable with only 6050 white blood cells.  58% were segmented neutrophils.  No organisms were seen on Gram stain and cultures are negative so far.  There were no crystals seen.  She was started on empiric antibiotic therapy.  Both admission blood cultures have now grown MSSA.  She has not noted much improvement in her left wrist pain yet.   Review of Systems: Review of Systems  Constitutional: Positive for chills and malaise/fatigue. Negative for diaphoresis and fever.  Gastrointestinal: Negative for abdominal pain, diarrhea, nausea and vomiting.  Musculoskeletal: Positive for joint pain.    Past Medical History:  Diagnosis Date  . Arthritis   . Breast cancer ([place])    b/l mastectomies hx  . Cancer [facility])    breast  . 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 Diabetes mellitus    fasting 90-100  . HTN (hypertension) 03/25/2013  . Hx of radiation therapy    breasts hx  . Hypercholesterolemia   . Hypertension   . Lymphedema of arm    left arm  . Type II or unspecified type diabetes mellitus without mention of complication, not stated as uncontrolled 03/25/2013    Social History  Tobacco Use  . Smoking status: Former Smoker    Types: Cigarettes    Last attempt to quit: 04/14/1972    Years since quitting: 45.6  . Smokeless tobacco: Never Used  Substance Use Topics  . Alcohol use: Yes    Comment: occasional  . Drug use: No    No family history on file. Allergies  Allergen Reactions  . Bactrim Swelling    SWELLING OF MOUTH/FACE.  Marland Kitchen Lisinopril Swelling    SWELLING OF MOUTH/FACE.  Marland Kitchen Vasotec Swelling    SWELLING OF MOUTH/FACE.    OBJECTIVE: Blood pressure 136/67, pulse 87, temperature 98.7 F (37.1 C), temperature source Oral, resp. rate 16, height 5\' 6"  (1.676 m), weight 95.2 kg, SpO2 100 %.  Physical Exam  Constitutional:  She is resting quietly in bed  with her left hand elevated on a pillow.  Cardiovascular: Normal rate, regular rhythm and normal heart sounds.  No murmur heard. Pulmonary/Chest: Effort normal and breath sounds normal.  Musculoskeletal:  There is diffuse swelling of her left wrist skin being down over the dorsum of her hand.  She has no unusual redness.  It is warm to the touch and painful with palpation.  Skin: No rash noted.  Psychiatric: She has a normal mood and affect.    Lab Results Lab Results  Component Value Date   WBC 14.4 (H) 12/01/2017   HGB 10.8 (L) 12/01/2017   HCT 34.2 (L) 12/01/2017   MCV 77.2 (L) 12/01/2017   PLT 294 12/01/2017    Lab Results  Component Value Date   CREATININE 0.68 12/01/2017   BUN 11 12/01/2017   NA 137 12/01/2017   K 3.1 (L) 12/01/2017   CL 104 12/01/2017   CO2 24 12/01/2017    Lab Results  Component Value Date   ALT 14 12/01/2017   AST 18 12/01/2017   ALKPHOS 51 12/01/2017   BILITOT 0.7 12/01/2017     Microbiology: Recent Results (from the past 240 hour(s))  Blood culture (routine x 2)     Status: None (Preliminary result)   Collection Time: 11/30/17  2:33 PM  Result Value Ref Range Status   Specimen Description BLOOD RIGHT HAND  Final   Special Requests   Final    BOTTLES DRAWN AEROBIC AND ANAEROBIC Blood Culture adequate volume   Culture   Final    NO GROWTH 1 DAY Performed at [facility], [place] [address] [place], [place] [address]    Report Status PENDING  Incomplete  Blood culture (routine x 2)     Status: Abnormal (Preliminary result)   Collection Time: 11/30/17  2:51 PM  Result Value Ref Range Status   Specimen Description BLOOD RIGHT WRIST  Final   Special Requests   Final    BOTTLES DRAWN AEROBIC AND ANAEROBIC Blood Culture results may not be optimal due to an inadequate volume of blood received in culture bottles   Culture  Setup Time   Final    AEROBIC BOTTLE ONLY GRAM POSITIVE COCCI CRITICAL RESULT CALLED TO, READ BACK BY AND VERIFIED  WITH: K PERKINS PHARMD 12/01/17 2006 JDW    Culture (A)  Final    STAPHYLOCOCCUS AUREUS SUSCEPTIBILITIES TO FOLLOW Performed at [facility], [place] [address], [place], [place] [address]    Report Status PENDING  Incomplete  Blood Culture ID Panel (Reflexed)     Status: Abnormal   Collection Time: 11/30/17  2:51 PM  Result Value Ref Range Status   Enterococcus  species NOT DETECTED NOT DETECTED Final   Listeria monocytogenes NOT DETECTED NOT DETECTED Final   Staphylococcus species DETECTED (A) NOT DETECTED Final    Comment: CRITICAL RESULT CALLED TO, READ BACK BY AND VERIFIED WITH: K PERKINS PHARMD 12/01/17 2006 JDW    Staphylococcus aureus DETECTED (A) NOT DETECTED Final    Comment: Methicillin (oxacillin) susceptible Staphylococcus aureus (MSSA). Preferred therapy is anti staphylococcal beta lactam antibiotic (Cefazolin or Nafcillin), unless clinically contraindicated. CRITICAL RESULT CALLED TO, READ BACK BY AND VERIFIED WITH: K PERKINS PHARMD 12/01/17 2006 JDW    Methicillin resistance NOT DETECTED NOT DETECTED Final   Streptococcus species NOT DETECTED NOT DETECTED Final   Streptococcus agalactiae NOT DETECTED NOT DETECTED Final   Streptococcus pneumoniae NOT DETECTED NOT DETECTED Final   Streptococcus pyogenes NOT DETECTED NOT DETECTED Final   Acinetobacter baumannii NOT DETECTED NOT DETECTED Final   Enterobacteriaceae species NOT DETECTED NOT DETECTED Final   Enterobacter cloacae complex NOT DETECTED NOT DETECTED Final   Escherichia coli NOT DETECTED NOT DETECTED Final   Klebsiella oxytoca NOT DETECTED NOT DETECTED Final   Klebsiella pneumoniae NOT DETECTED NOT DETECTED Final   Proteus species NOT DETECTED NOT DETECTED Final   Serratia marcescens NOT DETECTED NOT DETECTED Final   Haemophilus influenzae NOT DETECTED NOT DETECTED Final   Neisseria meningitidis NOT DETECTED NOT DETECTED Final   Pseudomonas aeruginosa NOT DETECTED NOT DETECTED Final   Candida albicans NOT  DETECTED NOT DETECTED Final   Candida glabrata NOT DETECTED NOT DETECTED Final   Candida krusei NOT DETECTED NOT DETECTED Final   Candida parapsilosis NOT DETECTED NOT DETECTED Final   Candida tropicalis NOT DETECTED NOT DETECTED Final    Comment: Performed at [facility], [place] [address] [place], [place] [address]  Body fluid culture     Status: None (Preliminary result)   Collection Time: 12/01/17 11:53 AM  Result Value Ref Range Status   Specimen Description SYNOVIAL LEFT WRIST  Final   Special Requests NONE  Final   Gram Stain   Final    RARE WBC PRESENT,BOTH PMN AND MONONUCLEAR NO ORGANISMS SEEN Performed at [facility], [place] [address], [place], [place] [address]    Culture PENDING  Incomplete   Report Status PENDING  Incomplete    Michel Bickers, MD [facility] for Infectious [place] [facility] [phone] pager   [phone] cell 12/02/2017, 10:47 AM

## 2017-12-02 NOTE — Plan of Care (Signed)

## 2017-12-02 NOTE — Progress Notes (Signed)
PROGRESS NOTE  Tami Schmidt BJY:[id number] DOB: 03-26-1933 DOA: 11/30/2017 PCP: Donald Prose, MD  HPI/brief narrative  Tami Schmidt is a 84 y.o. year old female with medical history significant for breast cancer status post bilateral mastectomy recurrent lymphedema, type 2 diabetes, HTN, and HLD who presented on 11/30/2017 with left hand swelling and pain for the past week and was found to have extensive left wrist tenosynovitis concerning for likely infectious etiology.Extensive tenosynovitis on MRI of left wrist ultrasound-guided aspiration of flexor tendons and carpus on 8/20 (the wrist joint was not aspirated).  Blood cultures from 8/19 growing MSSA.  Empirically started on vancomycin cefepime on day of admission  Subjective Still having left wrist pain.  Denies any fevers or chills.  Assessment/Plan:  1. Extensive left wrist tenosynovitis, suspect infectious etiology with concern for MSSA bacteremia.   Only 6000 WBC on fluid analysis of flexor tendons restaurant was not aspirated per Ortho) however MSSA in 1 of 2 blood cultures.  Appreciate ID recommendations.  Will repeat blood cultures and obtain TTE.  Continue empiric IV cefepime and IV Vanco.  Pending joint aspiration cultures also on board  2. Type 2 diabetes, A1c 6.6% (12/2016).  Holding home metformin  3. Hypertension, currently at goal.  Continue home amlodipine.  Holding home losartan/HCTZ while on NSAIDs  Code Status: Full code  Family Communication: Husband at bedside  Disposition Plan: Pending repeat blood cultures and TTE, he denied on IV antibiotics   Consultants:  Orthopedics, infectious disease  Procedures:  8/20 ultrasound guided aspiration of flexor tendons and carpus (left)  Antimicrobials: Anti-infectives (From admission, onward)   Start     Dose/Rate Route Frequency Ordered Stop   12/02/17 1400  ceFAZolin (ANCEF) IVPB 2g/100 mL premix     2 g 200 mL/hr over 30 Minutes Intravenous Every 8 hours  12/02/17 0853     11/30/17 2300  ceFEPIme (MAXIPIME) 1 g in sodium chloride 0.9 % 100 mL IVPB  Status:  Discontinued     1 g 200 mL/hr over 30 Minutes Intravenous Every 8 hours 11/30/17 2242 12/02/17 0824   11/30/17 2300  vancomycin (VANCOCIN) IVPB 750 mg/150 ml premix  Status:  Discontinued     750 mg 150 mL/hr over 60 Minutes Intravenous 2 times daily 11/30/17 2242 11/30/17 2243   11/30/17 1430  ceFEPIme (MAXIPIME) 1 g in sodium chloride 0.9 % 100 mL IVPB     1 g 200 mL/hr over 30 Minutes Intravenous  Once 11/30/17 1424 11/30/17 1629   11/30/17 1430  vancomycin (VANCOCIN) 1,500 mg in sodium chloride 0.9 % 500 mL IVPB  Status:  Discontinued     1,500 mg 250 mL/hr over 120 Minutes Intravenous Every 24 hours 11/30/17 1427 12/02/17 0824         Cultures:  8/19, 1 of 2 blood cultures growing MSSA  8/21 pending blood cultures  Telemetry: No  DVT prophylaxis: Heparin   Objective: Vitals:   12/01/17 1405 12/01/17 2033 12/01/17 2344 12/02/17 0323  BP: 139/65 (!) 141/65 139/64 136/67  Pulse: 83 87 83 87  Resp: 16 16 16 16   Temp: 98.9 F (37.2 C) 99.7 F (37.6 C) 99.1 F (37.3 C) 98.7 F (37.1 C)  TempSrc: Oral Oral Oral Oral  SpO2: 100% 100% 100% 100%  Weight:      Height:        Intake/Output Summary (Last 24 hours) at 12/02/2017 1317 Last data filed at 12/02/2017 0830 Gross per 24 hour  Intake 1010.2 ml  Output -  Net 1010.2 ml   Filed Weights   11/30/17 1407  Weight: 95.2 kg    Exam:  Constitutional:normal appearing female Eyes: EOMI, anicteric, normal conjunctivae ENMT: Oropharynx with moist mucous membranes, normal dentition Cardiovascular: RRR no MRGs, with no peripheral edema Respiratory: Normal respiratory effort, clear breath sounds  Skin: Significant swelling in left wrist and hand, elevated on pillow, no gross redness/erythema Neurologic: Grossly no focal neuro deficit. Psychiatric:Appropriate affect, and mood. Mental status AAOx3  Data  Reviewed: CBC: Recent Labs  Lab 11/30/17 1212 12/01/17 0756  WBC 14.0* 14.4*  HGB 11.7* 10.8*  HCT 38.1 34.2*  MCV 79.0 77.2*  PLT 331 299   Basic Metabolic Panel: Recent Labs  Lab 11/30/17 1212 12/01/17 0756  NA 135 137  K 3.8 3.1*  CL 99 104  CO2 22 24  GLUCOSE 150* 95  BUN 10 11  CREATININE 0.74 0.68  CALCIUM 8.7* 7.8*   GFR: Estimated Creatinine Clearance: 62 mL/min (by C-G formula based on SCr of 0.68 mg/dL). Liver Function Tests: Recent Labs  Lab 12/01/17 0756  AST 18  ALT 14  ALKPHOS 51  BILITOT 0.7  PROT 6.0*  ALBUMIN 2.5*   No results for input(s): LIPASE, AMYLASE in the last 168 hours. No results for input(s): AMMONIA in the last 168 hours. Coagulation Profile: No results for input(s): INR, PROTIME in the last 168 hours. Cardiac Enzymes: No results for input(s): CKTOTAL, CKMB, CKMBINDEX, TROPONINI in the last 168 hours. BNP (last 3 results) No results for input(s): PROBNP in the last 8760 hours. HbA1C: No results for input(s): HGBA1C in the last 72 hours. CBG: Recent Labs  Lab 12/01/17 1152 12/01/17 1631 12/01/17 2152 12/02/17 0651 12/02/17 1146  GLUCAP 114* 186* 124* 104* 82   Lipid Profile: No results for input(s): CHOL, HDL, LDLCALC, TRIG, CHOLHDL, LDLDIRECT in the last 72 hours. Thyroid Function Tests: No results for input(s): TSH, T4TOTAL, FREET4, T3FREE, THYROIDAB in the last 72 hours. Anemia Panel: No results for input(s): VITAMINB12, FOLATE, FERRITIN, TIBC, IRON, RETICCTPCT in the last 72 hours. Urine analysis:    Component Value Date/Time   COLORURINE YELLOW 12/02/2017 0615   APPEARANCEUR HAZY (A) 12/02/2017 0615   LABSPEC 1.017 12/02/2017 0615   PHURINE 6.0 12/02/2017 0615   GLUCOSEU NEGATIVE 12/02/2017 0615   HGBUR SMALL (A) 12/02/2017 0615   BILIRUBINUR NEGATIVE 12/02/2017 0615   KETONESUR NEGATIVE 12/02/2017 0615   PROTEINUR NEGATIVE 12/02/2017 0615   UROBILINOGEN 0.2 04/26/2010 0840   NITRITE NEGATIVE 12/02/2017  0615   LEUKOCYTESUR NEGATIVE 12/02/2017 0615   Sepsis Labs: @LABRCNTIP (procalcitonin:4,lacticidven:4)  ) Recent Results (from the past 240 hour(s))  Blood culture (routine x 2)     Status: None (Preliminary result)   Collection Time: 11/30/17  2:33 PM  Result Value Ref Range Status   Specimen Description BLOOD RIGHT HAND  Final   Special Requests   Final    BOTTLES DRAWN AEROBIC AND ANAEROBIC Blood Culture adequate volume   Culture   Final    NO GROWTH 2 DAYS Performed at [facility], [place] [address], [place], [place] [address]    Report Status PENDING  Incomplete  Blood culture (routine x 2)     Status: Abnormal (Preliminary result)   Collection Time: 11/30/17  2:51 PM  Result Value Ref Range Status   Specimen Description BLOOD RIGHT WRIST  Final   Special Requests   Final    BOTTLES DRAWN AEROBIC AND ANAEROBIC Blood Culture results may not be  optimal due to an inadequate volume of blood received in culture bottles   Culture  Setup Time   Final    AEROBIC BOTTLE ONLY GRAM POSITIVE COCCI CRITICAL RESULT CALLED TO, READ BACK BY AND VERIFIED WITH: K PERKINS PHARMD 12/01/17 2006 JDW    Culture (A)  Final    STAPHYLOCOCCUS AUREUS SUSCEPTIBILITIES TO FOLLOW Performed at [facility], [place] [address] [place], [place] [address]    Report Status PENDING  Incomplete  Blood Culture ID Panel (Reflexed)     Status: Abnormal   Collection Time: 11/30/17  2:51 PM  Result Value Ref Range Status   Enterococcus species NOT DETECTED NOT DETECTED Final   Listeria monocytogenes NOT DETECTED NOT DETECTED Final   Staphylococcus species DETECTED (A) NOT DETECTED Final    Comment: CRITICAL RESULT CALLED TO, READ BACK BY AND VERIFIED WITH: K PERKINS PHARMD 12/01/17 2006 JDW    Staphylococcus aureus DETECTED (A) NOT DETECTED Final    Comment: Methicillin (oxacillin) susceptible Staphylococcus aureus (MSSA). Preferred therapy is anti staphylococcal beta lactam antibiotic (Cefazolin or  Nafcillin), unless clinically contraindicated. CRITICAL RESULT CALLED TO, READ BACK BY AND VERIFIED WITH: K PERKINS PHARMD 12/01/17 2006 JDW    Methicillin resistance NOT DETECTED NOT DETECTED Final   Streptococcus species NOT DETECTED NOT DETECTED Final   Streptococcus agalactiae NOT DETECTED NOT DETECTED Final   Streptococcus pneumoniae NOT DETECTED NOT DETECTED Final   Streptococcus pyogenes NOT DETECTED NOT DETECTED Final   Acinetobacter baumannii NOT DETECTED NOT DETECTED Final   Enterobacteriaceae species NOT DETECTED NOT DETECTED Final   Enterobacter cloacae complex NOT DETECTED NOT DETECTED Final   Escherichia coli NOT DETECTED NOT DETECTED Final   Klebsiella oxytoca NOT DETECTED NOT DETECTED Final   Klebsiella pneumoniae NOT DETECTED NOT DETECTED Final   Proteus species NOT DETECTED NOT DETECTED Final   Serratia marcescens NOT DETECTED NOT DETECTED Final   Haemophilus influenzae NOT DETECTED NOT DETECTED Final   Neisseria meningitidis NOT DETECTED NOT DETECTED Final   Pseudomonas aeruginosa NOT DETECTED NOT DETECTED Final   Candida albicans NOT DETECTED NOT DETECTED Final   Candida glabrata NOT DETECTED NOT DETECTED Final   Candida krusei NOT DETECTED NOT DETECTED Final   Candida parapsilosis NOT DETECTED NOT DETECTED Final   Candida tropicalis NOT DETECTED NOT DETECTED Final    Comment: Performed at [facility], [place] [address] [place], [place] [address]  Body fluid culture     Status: None (Preliminary result)   Collection Time: 12/01/17 11:53 AM  Result Value Ref Range Status   Specimen Description SYNOVIAL LEFT WRIST  Final   Special Requests NONE  Final   Gram Stain   Final    RARE WBC PRESENT,BOTH PMN AND MONONUCLEAR NO ORGANISMS SEEN    Culture   Final    NO GROWTH < 24 HOURS Performed at [facility], [place] [address], [place], [place] [address]    Report Status PENDING  Incomplete      Studies: No results found.  Scheduled Meds: .  acetaminophen  650 mg Oral Q6H  . amLODipine  10 mg Oral Q breakfast  . aspirin EC  81 mg Oral Daily  . heparin  5,000 Units Subcutaneous Q8H  . insulin aspart  0-15 Units Subcutaneous TID WC  . insulin aspart  0-5 Units Subcutaneous QHS  . insulin NPH Human  40 Units Subcutaneous Q breakfast  . meloxicam  15 mg Oral Q breakfast  . omega-3 acid ethyl  esters  1 g Oral Q breakfast  . pantoprazole  40 mg Oral Daily  . tamoxifen  20 mg Oral Daily    Continuous Infusions: .  ceFAZolin (ANCEF) IV       LOS: 2 days     Tami Hane, MD [facility] Pager [phone]  If 7PM-7AM, please contact night-coverage [email or web address] Password [email or web address] 12/02/2017, 1:17 PM

## 2017-12-02 NOTE — Progress Notes (Signed)
12-01-17: US guided aspiration of fluid collection along flexor tendons at carpus (NOTE: THE WRIST JOINT WAS NOT ASPIRATED) negative for crystals, Gram stain with WBC & no organisms, WBC of fluid only 6K.  Not getting better or worse clinically by patient report  Today, L hand remains swollen, but also found in depended position.  No significant proximal spread of edema. TTP along flexor and extensor tendons.  If digits are supported PROM of wrist joint not particularly painful.  Most pain provoked with stretching of extensor tendons and also flexor tendons.  The wrist joint itself does not appear septic by exam, and the sampled fluid had no organisms on gram stain,   but cultures of fluid aspirate still pending.   Does not seem to be infectious etiology, and present evidence points more to rheumatologic problem than hand surgery problem.  I d/c'd the colchicine 2/2 no crystals in sampled fluid and no prior h/o gout. I d/c'd the toradol since patient now on standing meloxicam I added tylenol q6hours to help with pain  Might consider trial of further immune suppression with steroids if cultures remain negative (not yet reported for today).  Micheline Rough, MD Hand Surgery

## 2017-12-03 ENCOUNTER — Inpatient Hospital Stay (HOSPITAL_COMMUNITY): Payer: Medicare Other

## 2017-12-03 DIAGNOSIS — I10 Essential (primary) hypertension: Secondary | ICD-10-CM

## 2017-12-03 LAB — ECHOCARDIOGRAM COMPLETE
Height: 66 in
Weight: 3358.05 oz

## 2017-12-03 LAB — GLUCOSE, CAPILLARY
GLUCOSE-CAPILLARY: 126 mg/dL — AB (ref 70–99)
Glucose-Capillary: 71 mg/dL (ref 70–99)
Glucose-Capillary: 110 mg/dL — ABNORMAL HIGH (ref 70–99)
Glucose-Capillary: 120 mg/dL — ABNORMAL HIGH (ref 70–99)

## 2017-12-03 LAB — CULTURE, BLOOD (ROUTINE X 2)

## 2017-12-03 NOTE — Progress Notes (Signed)
Patient ID: Tami Schmidt, female   DOB: 01/22/1933, 84 y.o.   MRN: [id number]         [facility]  Date of Admission:  11/30/2017   Total days of antibiotics 4        Day 2 cefazolin          ASSESSMENT: She has MSSA bacteremia and probable left wrist and hand infection.  She is beginning to improve on antibiotic therapy.  Repeat blood cultures and TTE are pending.  PLAN: 1. Continue cefazolin 2. Hold off on PICC placement until we know blood cultures are negative  Principal Problem:   Bacteremia due to methicillin susceptible Staphylococcus aureus (MSSA) Active Problems:   Infection of left wrist (HCC)   Chest wall recurrence of breast cancer (HCC)   HTN (hypertension)   Lymphedema of upper extremity   Tenosynovitis of left wrist   Scheduled Meds: . acetaminophen  650 mg Oral Q6H  . amLODipine  10 mg Oral Q breakfast  . aspirin EC  81 mg Oral Daily  . heparin  5,000 Units Subcutaneous Q8H  . insulin aspart  0-15 Units Subcutaneous TID WC  . insulin aspart  0-5 Units Subcutaneous QHS  . insulin NPH Human  40 Units Subcutaneous Q breakfast  . meloxicam  15 mg Oral Q breakfast  . omega-3 acid ethyl esters  1 g Oral Q breakfast  . pantoprazole  40 mg Oral Daily  . tamoxifen  20 mg Oral Daily   Continuous Infusions: .  ceFAZolin (ANCEF) IV 2 g (12/03/17 6629)   PRN Meds:.iopamidol, iopamidol, morphine injection   SUBJECTIVE: She is feeling better today.  She is having less pain in her left wrist and hand and feels like the swelling is significantly better.  She did not have any chills last night.  Review of Systems: Review of Systems  Constitutional: Negative for chills, diaphoresis and fever.  Musculoskeletal: Positive for joint pain.    Allergies  Allergen Reactions  . Bactrim Swelling    SWELLING OF MOUTH/FACE.  Marland Kitchen Lisinopril Swelling    SWELLING OF MOUTH/FACE.  Marland Kitchen Vasotec Swelling    SWELLING OF MOUTH/FACE.     OBJECTIVE: Vitals:   12/02/17 0323 12/02/17 1337 12/02/17 2051 12/03/17 0402  BP: 136/67 (!) 104/54 (!) 147/71 (!) 147/70  Pulse: 87 77 81 71  Resp: 16 14 16 16   Temp: 98.7 F (37.1 C) 98.6 F (37 C) 98.6 F (37 C) 98.6 F (37 C)  TempSrc: Oral Oral Oral Oral  SpO2: 100% 96% 100% 97%  Weight:      Height:       Body mass index is 33.88 kg/m.  Physical Exam  Constitutional: She is oriented to person, place, and time.  She is resting quietly in bed.  She is in good spirits.  Cardiovascular: Normal rate, regular rhythm and normal heart sounds.  No murmur heard. Pulmonary/Chest: Effort normal and breath sounds normal.  Musculoskeletal: She exhibits edema and tenderness.  There has been a significant decrease in the diffuse swelling over her left wrist and dorsum of her hand.  It is still slightly warm.  Neurological: She is alert and oriented to person, place, and time.  Skin: No rash noted.  Psychiatric: She has a normal mood and affect.    Lab Results Lab Results  Component Value Date   WBC 14.4 (H) 12/01/2017   HGB 10.8 (L) 12/01/2017   HCT 34.2 (L) 12/01/2017   MCV 77.2 (  L) 12/01/2017   PLT 294 12/01/2017    Lab Results  Component Value Date   CREATININE 0.68 12/01/2017   BUN 11 12/01/2017   NA 137 12/01/2017   K 3.1 (L) 12/01/2017   CL 104 12/01/2017   CO2 24 12/01/2017    Lab Results  Component Value Date   ALT 14 12/01/2017   AST 18 12/01/2017   ALKPHOS 51 12/01/2017   BILITOT 0.7 12/01/2017     Microbiology: Recent Results (from the past 240 hour(s))  Blood culture (routine x 2)     Status: None (Preliminary result)   Collection Time: 11/30/17  2:33 PM  Result Value Ref Range Status   Specimen Description BLOOD RIGHT HAND  Final   Special Requests   Final    BOTTLES DRAWN AEROBIC AND ANAEROBIC Blood Culture adequate volume   Culture   Final    NO GROWTH 3 DAYS Performed at [facility], [place] [address], [place], [place] [address]     Report Status PENDING  Incomplete  Blood culture (routine x 2)     Status: Abnormal   Collection Time: 11/30/17  2:51 PM  Result Value Ref Range Status   Specimen Description BLOOD RIGHT WRIST  Final   Special Requests   Final    BOTTLES DRAWN AEROBIC AND ANAEROBIC Blood Culture results may not be optimal due to an inadequate volume of blood received in culture bottles   Culture  Setup Time   Final    AEROBIC BOTTLE ONLY GRAM POSITIVE COCCI CRITICAL RESULT CALLED TO, READ BACK BY AND VERIFIED WITH: Mathews Robinsons [facility] 12/01/17 2006 JDW Performed at [facility], [address] [place], [place] [address]    Culture STAPHYLOCOCCUS AUREUS (A)  Final   Report Status 12/03/2017 FINAL  Final   Organism ID, Bacteria STAPHYLOCOCCUS AUREUS  Final      Susceptibility   Staphylococcus aureus - MIC*    CIPROFLOXACIN <=0.5 SENSITIVE Sensitive     ERYTHROMYCIN <=0.25 SENSITIVE Sensitive     GENTAMICIN <=0.5 SENSITIVE Sensitive     OXACILLIN <=0.25 SENSITIVE Sensitive     TETRACYCLINE <=1 SENSITIVE Sensitive     VANCOMYCIN <=0.5 SENSITIVE Sensitive     TRIMETH/SULFA <=10 SENSITIVE Sensitive     CLINDAMYCIN <=0.25 SENSITIVE Sensitive     RIFAMPIN <=0.5 SENSITIVE Sensitive     Inducible Clindamycin NEGATIVE Sensitive     * STAPHYLOCOCCUS AUREUS  Blood Culture ID Panel (Reflexed)     Status: Abnormal   Collection Time: 11/30/17  2:51 PM  Result Value Ref Range Status   Enterococcus species NOT DETECTED NOT DETECTED Final   Listeria monocytogenes NOT DETECTED NOT DETECTED Final   Staphylococcus species DETECTED (A) NOT DETECTED Final    Comment: CRITICAL RESULT CALLED TO, READ BACK BY AND VERIFIED WITH: K PERKINS PHARMD 12/01/17 2006 JDW    Staphylococcus aureus DETECTED (A) NOT DETECTED Final    Comment: Methicillin (oxacillin) susceptible Staphylococcus aureus (MSSA). Preferred therapy is anti staphylococcal beta lactam antibiotic (Cefazolin or Nafcillin), unless clinically  contraindicated. CRITICAL RESULT CALLED TO, READ BACK BY AND VERIFIED WITH: K PERKINS PHARMD 12/01/17 2006 JDW    Methicillin resistance NOT DETECTED NOT DETECTED Final   Streptococcus species NOT DETECTED NOT DETECTED Final   Streptococcus agalactiae NOT DETECTED NOT DETECTED Final   Streptococcus pneumoniae NOT DETECTED NOT DETECTED Final   Streptococcus pyogenes NOT DETECTED NOT DETECTED Final   Acinetobacter baumannii NOT DETECTED NOT DETECTED Final   Enterobacteriaceae  species NOT DETECTED NOT DETECTED Final   Enterobacter cloacae complex NOT DETECTED NOT DETECTED Final   Escherichia coli NOT DETECTED NOT DETECTED Final   Klebsiella oxytoca NOT DETECTED NOT DETECTED Final   Klebsiella pneumoniae NOT DETECTED NOT DETECTED Final   Proteus species NOT DETECTED NOT DETECTED Final   Serratia marcescens NOT DETECTED NOT DETECTED Final   Haemophilus influenzae NOT DETECTED NOT DETECTED Final   Neisseria meningitidis NOT DETECTED NOT DETECTED Final   Pseudomonas aeruginosa NOT DETECTED NOT DETECTED Final   Candida albicans NOT DETECTED NOT DETECTED Final   Candida glabrata NOT DETECTED NOT DETECTED Final   Candida krusei NOT DETECTED NOT DETECTED Final   Candida parapsilosis NOT DETECTED NOT DETECTED Final   Candida tropicalis NOT DETECTED NOT DETECTED Final    Comment: Performed at [facility], [place] [address], [place], [place] [address]  Body fluid culture     Status: None (Preliminary result)   Collection Time: 12/01/17 11:53 AM  Result Value Ref Range Status   Specimen Description SYNOVIAL LEFT WRIST  Final   Special Requests NONE  Final   Gram Stain   Final    RARE WBC PRESENT,BOTH PMN AND MONONUCLEAR NO ORGANISMS SEEN    Culture   Final    NO GROWTH 2 DAYS Performed at [facility], [place] [address] [place], [place] [address]    Report Status PENDING  Incomplete  Culture, blood (routine x 2)     Status: None (Preliminary result)   Collection Time: 12/02/17  8:57  AM  Result Value Ref Range Status   Specimen Description BLOOD RIGHT HAND  Final   Special Requests   Final    BOTTLES DRAWN AEROBIC AND ANAEROBIC Blood Culture adequate volume   Culture   Final    NO GROWTH 1 DAY Performed at [facility], [place] [address], [place], [place] [address]    Report Status PENDING  Incomplete  Culture, blood (routine x 2)     Status: None (Preliminary result)   Collection Time: 12/02/17  9:08 AM  Result Value Ref Range Status   Specimen Description BLOOD RIGHT ARM  Final   Special Requests   Final    BOTTLES DRAWN AEROBIC AND ANAEROBIC Blood Culture adequate volume   Culture   Final    NO GROWTH 1 DAY Performed at [facility], [place] [address], [place], [place] [address]    Report Status PENDING  Incomplete    Michel Bickers, MD [facility] [phone] pager   [phone] cell 12/03/2017, 11:21 AM

## 2017-12-03 NOTE — Progress Notes (Signed)
ANTIBIOTIC CONSULT NOTE  Pharmacy Consult for Ancef Indication: MsSA  Allergies  Allergen Reactions  . Bactrim Swelling    SWELLING OF MOUTH/FACE.  Marland Kitchen Lisinopril Swelling    SWELLING OF MOUTH/FACE.  Marland Kitchen Vasotec Swelling    SWELLING OF MOUTH/FACE.    Patient Measurements: Height: 5\' 6"  (167.6 cm) Weight: 209 lb 14.1 oz (95.2 kg) IBW/kg (Calculated) : 59.3 Adjusted Body Weight:    Vital Signs: Temp: 98.6 F (37 C) (08/22 0402) Temp Source: Oral (08/22 0402) BP: 147/70 (08/22 0402) Pulse Rate: 71 (08/22 0402) Intake/Output from previous day: 08/21 0701 - 08/22 0700 In: 820 [P.O.:720; IV Piggyback:100] Out: -  Intake/Output from this shift: Total I/O In: 290 [P.O.:290] Out: -   Labs: Recent Labs    11/30/17 1212 12/01/17 0756  WBC 14.0* 14.4*  HGB 11.7* 10.8*  PLT 331 294  CREATININE 0.74 0.68   Estimated Creatinine Clearance: 62 mL/min (by C-G formula based on SCr of 0.68 mg/dL). No results for input(s): VANCOTROUGH, VANCOPEAK, VANCORANDOM, GENTTROUGH, GENTPEAK, GENTRANDOM, TOBRATROUGH, TOBRAPEAK, TOBRARND, AMIKACINPEAK, AMIKACINTROU, AMIKACIN in the last 72 hours.   Microbiology:   Medical History: Past Medical History:  Diagnosis Date  . Arthritis   . Breast cancer ([place])    b/l mastectomies hx  . Cancer [facility])    breast  . 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 Diabetes mellitus    fasting 90-100  . HTN (hypertension) 03/25/2013  . Hx of radiation therapy    breasts hx  . Hypercholesterolemia   . Hypertension   . Lymphedema of arm    left arm  . Type II or unspecified type diabetes mellitus without mention of complication, not stated as uncontrolled 03/25/2013    Assessment:  ID: Abx D#4 for cellulitis of L hand/wrist,  tenosynovitis. Infection vs inflammation- Afebrile,  WBC 14.4 unchanged, SCr 0.74 - 8/20: she hasn't markedly improved, but has not worsened either  Vanc 8/19>>8/21 Cefepime 8/19>>8/21 Cefazolin 8/21>>  8/21  BCx: > 8/19 BCx: GPC in 1/4 - BCID MSSA 8/20 L Wrist Synovial Fluid Cx: no orgs    Goal of Therapy:  Eradication of infection  Plan:  Ancef 2g IV q 8hrs Pharmacy will sign off. Please reconsult for further dosing assitance.   Cristopher Ciccarelli S. Alford Highland, PharmD, BCPS Clinical Staff Pharmacist  Eilene Ghazi Stillinger 12/03/2017,11:01 AM

## 2017-12-03 NOTE — Progress Notes (Signed)
Tami Schmidt, Tami Schmidt, Tami Schmidt

## 2017-12-03 NOTE — Progress Notes (Signed)
PROGRESS NOTE  Tami Schmidt BCW:[id number] DOB: 04-Oct-1932 DOA: 11/30/2017 PCP: Donald Prose, MD  HPI/brief narrative  Tami Schmidt is a 84 y.o. year old female with medical history significant for breast cancer status post bilateral mastectomy recurrent lymphedema, type 2 diabetes, HTN, and HLD who presented on 11/30/2017 with left hand swelling and pain for the past week and was found to have extensive left wrist tenosynovitis concerning for likely infectious etiology.Extensive tenosynovitis on MRI of left wrist ultrasound-guided aspiration of flexor tendons and carpus on 8/20 (the wrist joint was not aspirated).  Blood cultures from 8/19 growing MSSA.  Empirically started on vancomycin cefepime on day of admission.  Switch to cefazolin to cover MSSA bacteria.  Subjective Reports improvement in left wrist pain.  No fevers or chills  Assessment/Plan:  1. Extensive left wrist tenosynovitis, suspect infectious etiology with concern for MSSA bacteremia, stable.  He is afebrile, reports improvement in wrist pain   only 6000 WBC on fluid analysis of flexor tendons, wrist joint was not aspirated per Ortho) however MSSA in 1 of 2 blood cultures.  Appreciate ID recommendations.  Following repeat blood cultures, pending TTE, will obtain PICC line once cultures remain negative.  Will repeat blood cultures and obtain TTE.  On cefazolin, pending joint aspiration cultures also on board  2. Type 2 diabetes, A1c 6.6% (12/2016).  Holding home metformin.  Continue home NPH 40 units with breakfast  3. Hypertension, currently at goal.  Continue home amlodipine.  Holding home losartan/HCTZ while on NSAIDs  Code Status: Full code  Family Communication: Husband at bedside  Disposition Plan: Pending repeat blood cultures and TTE, will need PICC placed once cultures remain negative   Consultants:  Orthopedics, infectious disease  Procedures:  8/20 ultrasound guided aspiration of flexor tendons and  carpus (left)  12/03/2017 TTE pending  Antimicrobials: Anti-infectives (From admission, onward)   Start     Dose/Rate Route Frequency Ordered Stop   12/02/17 1400  ceFAZolin (ANCEF) IVPB 2g/100 mL premix     2 g 200 mL/hr over 30 Minutes Intravenous Every 8 hours 12/02/17 0853     11/30/17 2300  ceFEPIme (MAXIPIME) 1 g in sodium chloride 0.9 % 100 mL IVPB  Status:  Discontinued     1 g 200 mL/hr over 30 Minutes Intravenous Every 8 hours 11/30/17 2242 12/02/17 0824   11/30/17 2300  vancomycin (VANCOCIN) IVPB 750 mg/150 ml premix  Status:  Discontinued     750 mg 150 mL/hr over 60 Minutes Intravenous 2 times daily 11/30/17 2242 11/30/17 2243   11/30/17 1430  ceFEPIme (MAXIPIME) 1 g in sodium chloride 0.9 % 100 mL IVPB     1 g 200 mL/hr over 30 Minutes Intravenous  Once 11/30/17 1424 11/30/17 1629   11/30/17 1430  vancomycin (VANCOCIN) 1,500 mg in sodium chloride 0.9 % 500 mL IVPB  Status:  Discontinued     1,500 mg 250 mL/hr over 120 Minutes Intravenous Every 24 hours 11/30/17 1427 12/02/17 0824        Cultures:  8/19, 1 of 2 blood cultures growing MSSA  8/21 pending blood cultures  Telemetry: No  DVT prophylaxis: Heparin   Objective: Vitals:   12/02/17 0323 12/02/17 1337 12/02/17 2051 12/03/17 0402  BP: 136/67 (!) 104/54 (!) 147/71 (!) 147/70  Pulse: 87 77 81 71  Resp: 16 14 16 16   Temp: 98.7 F (37.1 C) 98.6 F (37 C) 98.6 F (37 C) 98.6 F (37 C)  TempSrc: Oral Oral Oral  Oral  SpO2: 100% 96% 100% 97%  Weight:      Height:        Intake/Output Summary (Last 24 hours) at 12/03/2017 1429 Last data filed at 12/03/2017 0900 Gross per 24 hour  Intake 630 ml  Output -  Net 630 ml   Filed Weights   11/30/17 1407  Weight: 95.2 kg    Exam:  Constitutional:normal appearing female Eyes: EOMI, anicteric, normal conjunctivae ENMT: Oropharynx with moist mucous membranes, normal dentition Cardiovascular: RRR no MRGs, with no peripheral edema Respiratory: Normal  respiratory effort, clear breath sounds  Skin: Significantly less swelling in left wrist and hand (improved from prior exam) disease, elevated on pillow, no gross redness/erythema Neurologic: Grossly no focal neuro deficit. Psychiatric:Appropriate affect, and mood. Mental status AAOx3  Data Reviewed: CBC: Recent Labs  Lab 11/30/17 1212 12/01/17 0756  WBC 14.0* 14.4*  HGB 11.7* 10.8*  HCT 38.1 34.2*  MCV 79.0 77.2*  PLT 331 284   Basic Metabolic Panel: Recent Labs  Lab 11/30/17 1212 12/01/17 0756  NA 135 137  K 3.8 3.1*  CL 99 104  CO2 22 24  GLUCOSE 150* 95  BUN 10 11  CREATININE 0.74 0.68  CALCIUM 8.7* 7.8*   GFR: Estimated Creatinine Clearance: 62 mL/min (by C-G formula based on SCr of 0.68 mg/dL). Liver Function Tests: Recent Labs  Lab 12/01/17 0756  AST 18  ALT 14  ALKPHOS 51  BILITOT 0.7  PROT 6.0*  ALBUMIN 2.5*   No results for input(s): LIPASE, AMYLASE in the last 168 hours. No results for input(s): AMMONIA in the last 168 hours. Coagulation Profile: No results for input(s): INR, PROTIME in the last 168 hours. Cardiac Enzymes: No results for input(s): CKTOTAL, CKMB, CKMBINDEX, TROPONINI in the last 168 hours. BNP (last 3 results) No results for input(s): PROBNP in the last 8760 hours. HbA1C: No results for input(s): HGBA1C in the last 72 hours. CBG: Recent Labs  Lab 12/02/17 1146 12/02/17 1613 12/02/17 2047 12/03/17 0737 12/03/17 1154  GLUCAP 82 111* 102* 71 110*   Lipid Profile: No results for input(s): CHOL, HDL, LDLCALC, TRIG, CHOLHDL, LDLDIRECT in the last 72 hours. Thyroid Function Tests: No results for input(s): TSH, T4TOTAL, FREET4, T3FREE, THYROIDAB in the last 72 hours. Anemia Panel: No results for input(s): VITAMINB12, FOLATE, FERRITIN, TIBC, IRON, RETICCTPCT in the last 72 hours. Urine analysis:    Component Value Date/Time   COLORURINE YELLOW 12/02/2017 0615   APPEARANCEUR HAZY (A) 12/02/2017 0615   LABSPEC 1.017 12/02/2017  0615   PHURINE 6.0 12/02/2017 0615   GLUCOSEU NEGATIVE 12/02/2017 0615   HGBUR SMALL (A) 12/02/2017 0615   BILIRUBINUR NEGATIVE 12/02/2017 0615   KETONESUR NEGATIVE 12/02/2017 0615   PROTEINUR NEGATIVE 12/02/2017 0615   UROBILINOGEN 0.2 04/26/2010 0840   NITRITE NEGATIVE 12/02/2017 0615   LEUKOCYTESUR NEGATIVE 12/02/2017 0615   Sepsis Labs: @LABRCNTIP (procalcitonin:4,lacticidven:4)  ) Recent Results (from the past 240 hour(s))  Blood culture (routine x 2)     Status: None (Preliminary result)   Collection Time: 11/30/17  2:33 PM  Result Value Ref Range Status   Specimen Description BLOOD RIGHT HAND  Final   Special Requests   Final    BOTTLES DRAWN AEROBIC AND ANAEROBIC Blood Culture adequate volume   Culture   Final    NO GROWTH 3 DAYS Performed at [facility], [place] [address], [place], [place] [address]    Report Status PENDING  Incomplete  Blood culture (routine x 2)  Status: Abnormal   Collection Time: 11/30/17  2:51 PM  Result Value Ref Range Status   Specimen Description BLOOD RIGHT WRIST  Final   Special Requests   Final    BOTTLES DRAWN AEROBIC AND ANAEROBIC Blood Culture results may not be optimal due to an inadequate volume of blood received in culture bottles   Culture  Setup Time   Final    AEROBIC BOTTLE ONLY GRAM POSITIVE COCCI CRITICAL RESULT CALLED TO, READ BACK BY AND VERIFIED WITH: Mathews Robinsons [facility] 12/01/17 2006 JDW Performed at [facility], [place] [address] [place], [place] [address]    Culture STAPHYLOCOCCUS AUREUS (A)  Final   Report Status 12/03/2017 FINAL  Final   Organism ID, Bacteria STAPHYLOCOCCUS AUREUS  Final      Susceptibility   Staphylococcus aureus - MIC*    CIPROFLOXACIN <=0.5 SENSITIVE Sensitive     ERYTHROMYCIN <=0.25 SENSITIVE Sensitive     GENTAMICIN <=0.5 SENSITIVE Sensitive     OXACILLIN <=0.25 SENSITIVE Sensitive     TETRACYCLINE <=1 SENSITIVE Sensitive     VANCOMYCIN <=0.5 SENSITIVE Sensitive     TRIMETH/SULFA  <=10 SENSITIVE Sensitive     CLINDAMYCIN <=0.25 SENSITIVE Sensitive     RIFAMPIN <=0.5 SENSITIVE Sensitive     Inducible Clindamycin NEGATIVE Sensitive     * STAPHYLOCOCCUS AUREUS  Blood Culture ID Panel (Reflexed)     Status: Abnormal   Collection Time: 11/30/17  2:51 PM  Result Value Ref Range Status   Enterococcus species NOT DETECTED NOT DETECTED Final   Listeria monocytogenes NOT DETECTED NOT DETECTED Final   Staphylococcus species DETECTED (A) NOT DETECTED Final    Comment: CRITICAL RESULT CALLED TO, READ BACK BY AND VERIFIED WITH: K PERKINS PHARMD 12/01/17 2006 JDW    Staphylococcus aureus DETECTED (A) NOT DETECTED Final    Comment: Methicillin (oxacillin) susceptible Staphylococcus aureus (MSSA). Preferred therapy is anti staphylococcal beta lactam antibiotic (Cefazolin or Nafcillin), unless clinically contraindicated. CRITICAL RESULT CALLED TO, READ BACK BY AND VERIFIED WITH: K PERKINS PHARMD 12/01/17 2006 JDW    Methicillin resistance NOT DETECTED NOT DETECTED Final   Streptococcus species NOT DETECTED NOT DETECTED Final   Streptococcus agalactiae NOT DETECTED NOT DETECTED Final   Streptococcus pneumoniae NOT DETECTED NOT DETECTED Final   Streptococcus pyogenes NOT DETECTED NOT DETECTED Final   Acinetobacter baumannii NOT DETECTED NOT DETECTED Final   Enterobacteriaceae species NOT DETECTED NOT DETECTED Final   Enterobacter cloacae complex NOT DETECTED NOT DETECTED Final   Escherichia coli NOT DETECTED NOT DETECTED Final   Klebsiella oxytoca NOT DETECTED NOT DETECTED Final   Klebsiella pneumoniae NOT DETECTED NOT DETECTED Final   Proteus species NOT DETECTED NOT DETECTED Final   Serratia marcescens NOT DETECTED NOT DETECTED Final   Haemophilus influenzae NOT DETECTED NOT DETECTED Final   Neisseria meningitidis NOT DETECTED NOT DETECTED Final   Pseudomonas aeruginosa NOT DETECTED NOT DETECTED Final   Candida albicans NOT DETECTED NOT DETECTED Final   Candida glabrata NOT  DETECTED NOT DETECTED Final   Candida krusei NOT DETECTED NOT DETECTED Final   Candida parapsilosis NOT DETECTED NOT DETECTED Final   Candida tropicalis NOT DETECTED NOT DETECTED Final    Comment: Performed at [facility], [place] [address], [place], [place] [address]  Body fluid culture     Status: None (Preliminary result)   Collection Time: 12/01/17 11:53 AM  Result Value Ref Range Status   Specimen Description SYNOVIAL LEFT WRIST  Final   Special Requests  NONE  Final   Gram Stain   Final    RARE WBC PRESENT,BOTH PMN AND MONONUCLEAR NO ORGANISMS SEEN    Culture   Final    NO GROWTH 2 DAYS Performed at [facility], [address], [place], [place] [address]    Report Status PENDING  Incomplete  Culture, blood (routine x 2)     Status: None (Preliminary result)   Collection Time: 12/02/17  8:57 AM  Result Value Ref Range Status   Specimen Description BLOOD RIGHT HAND  Final   Special Requests   Final    BOTTLES DRAWN AEROBIC AND ANAEROBIC Blood Culture adequate volume   Culture   Final    NO GROWTH 1 DAY Performed at [facility], [place] [address], [place], [place] [address]    Report Status PENDING  Incomplete  Culture, blood (routine x 2)     Status: None (Preliminary result)   Collection Time: 12/02/17  9:08 AM  Result Value Ref Range Status   Specimen Description BLOOD RIGHT ARM  Final   Special Requests   Final    BOTTLES DRAWN AEROBIC AND ANAEROBIC Blood Culture adequate volume   Culture   Final    NO GROWTH 1 DAY Performed at [facility], [place] [address], [place], [place] [address]    Report Status PENDING  Incomplete      Studies: No results found.  Scheduled Meds: . acetaminophen  650 mg Oral Q6H  . amLODipine  10 mg Oral Q breakfast  . aspirin EC  81 mg Oral Daily  . heparin  5,000 Units Subcutaneous Q8H  . insulin aspart  0-15 Units Subcutaneous TID WC  . insulin aspart  0-5 Units Subcutaneous QHS  . insulin NPH Human  40 Units  Subcutaneous Q breakfast  . meloxicam  15 mg Oral Q breakfast  . omega-3 acid ethyl esters  1 g Oral Q breakfast  . pantoprazole  40 mg Oral Daily  . tamoxifen  20 mg Oral Daily    Continuous Infusions: .  ceFAZolin (ANCEF) IV 2 g (12/03/17 1309)     LOS: 3 days     Desiree Hane, MD [facility] Pager [phone]  If 7PM-7AM, please contact night-coverage [email or web address] Password [email or web address] 12/03/2017, 2:29 PM

## 2017-12-03 NOTE — Progress Notes (Signed)
  Echocardiogram 2D Echocardiogram has been performed.  Tami Schmidt F 12/03/2017, 2:48 PM

## 2017-12-03 NOTE — Progress Notes (Signed)
Clinically improved today.  Will make NPO after MN and re-check in AM.

## 2017-12-03 NOTE — Progress Notes (Signed)
Nutrition Brief Note  Patient identified on the Malnutrition Screening Tool (MST) Report.  Pt admitted for L arm cellulitis.   Wt Readings from Last 15 Encounters:  11/30/17 95.2 kg  02/09/17 95.2 kg  01/05/17 97.1 kg  12/26/16 97.2 kg  03/24/16 100.9 kg  11/19/15 100.6 kg  07/19/15 100.4 kg  03/20/15 101.4 kg  11/01/14 100 kg  09/20/14 98.9 kg  07/27/14 102.2 kg  01/17/14 104.3 kg  09/22/13 105.6 kg  06/23/13 106.6 kg  06/16/13 105.7 kg   Body mass index is 33.88 kg/m. Patient meets criteria for Obesity Class II based on current BMI.   Current diet order is Soft. She reports a good appetite. Consuming approximately 75% of meals at this time.   Labs and medications reviewed. No nutrition interventions warranted at this time.  If nutrition issues arise, please consult RD.   Arthur Holms, RD, LDN Pager #: [phone] After-Hours Pager #: [phone]

## 2017-12-03 NOTE — Care Management Important Message (Signed)
Important Message  Patient Details  Name: Tami Schmidt MRN: [id number] Date of Birth: 1932-10-17   Medicare Important Message Given:  Yes    Orbie Pyo 12/03/2017, 2:03 PM

## 2017-12-03 NOTE — Plan of Care (Signed)
  Problem: Education: Goal: Knowledge of General Education information will improve Description Including pain rating scale, medication(s)/side effects and non-pharmacologic comfort measures Outcome: Progressing Note:  POC reviewed with pt.   

## 2017-12-04 DIAGNOSIS — M009 Pyogenic arthritis, unspecified: Secondary | ICD-10-CM

## 2017-12-04 LAB — BODY FLUID CULTURE: Culture: NO GROWTH

## 2017-12-04 LAB — GLUCOSE, CAPILLARY
GLUCOSE-CAPILLARY: 115 mg/dL — AB (ref 70–99)
GLUCOSE-CAPILLARY: 146 mg/dL — AB (ref 70–99)
Glucose-Capillary: 128 mg/dL — ABNORMAL HIGH (ref 70–99)
Glucose-Capillary: 170 mg/dL — ABNORMAL HIGH (ref 70–99)

## 2017-12-04 LAB — MRSA PCR SCREENING: MRSA BY PCR: NEGATIVE

## 2017-12-04 MED ORDER — POLYETHYLENE GLYCOL 3350 17 G PO PACK
17.0000 g | PACK | Freq: Every day | ORAL | Status: DC | PRN
  Administered 2017-12-04 – 2017-12-07 (×2): 17 g via ORAL
  Filled 2017-12-04 (×2): qty 1

## 2017-12-04 MED ORDER — DOCUSATE SODIUM 100 MG PO CAPS
100.0000 mg | ORAL_CAPSULE | Freq: Every day | ORAL | Status: DC | PRN
  Administered 2017-12-04 – 2017-12-05 (×2): 100 mg via ORAL
  Filled 2017-12-04 (×2): qty 1

## 2017-12-04 NOTE — Progress Notes (Signed)
Orthopedic Tech Progress Note Patient Details:  Tami Schmidt 1932/12/17 [id number]  Ortho Devices Type of Ortho Device: Velcro wrist splint Ortho Device/Splint Location: lue Ortho Device/Splint Interventions: Application   Post Interventions Patient Tolerated: Well Instructions Provided: Care of device   [name] 12/04/2017, 2:53 PM

## 2017-12-04 NOTE — Progress Notes (Signed)
Patient ID: Tami Schmidt, female   DOB: 1932/10/22, 84 y.o.   MRN: [id number]         [facility]  Date of Admission:  11/30/2017   Total days of antibiotics 5        Day 3 cefazolin          ASSESSMENT: She has MSSA bacteremia and probable left wrist and hand infection.  Repeat blood cultures are negative so far.  She has no evidence of endocarditis by exam or TTE but should undergo TEE.  It is still unclear if she will need surgery on her left hand.  PLAN: 1. Continue cefazolin 2. Await results of repeat blood cultures 3. Recommend a transesophageal echocardiogram 4. Please call Dr. Talbot Grumbling [phone]) for any infectious disease questions this weekend  Principal Problem:   Bacteremia due to methicillin susceptible Staphylococcus aureus (MSSA) Active Problems:   Infection of left wrist ([place])   Chest wall recurrence of breast cancer ([place])   HTN (hypertension)   Lymphedema of upper extremity   Tenosynovitis of left wrist   Scheduled Meds: . acetaminophen  650 mg Oral Q6H  . amLODipine  10 mg Oral Q breakfast  . aspirin EC  81 mg Oral Daily  . heparin  5,000 Units Subcutaneous Q8H  . insulin aspart  0-15 Units Subcutaneous TID WC  . insulin aspart  0-5 Units Subcutaneous QHS  . insulin NPH Human  40 Units Subcutaneous Q breakfast  . meloxicam  15 mg Oral Q breakfast  . omega-3 acid ethyl esters  1 g Oral Q breakfast  . pantoprazole  40 mg Oral Daily  . tamoxifen  20 mg Oral Daily   Continuous Infusions: .  ceFAZolin (ANCEF) IV 2 g (12/04/17 0624)   PRN Meds:.docusate sodium, iopamidol, iopamidol, morphine injection, polyethylene glycol   SUBJECTIVE: She has not noted much change overnight.  She is not having any pain as long as she keeps her hand still.  She feels like the swelling on the dorsum of her hand has improved a little bit but she has not noted any improvement in the swelling of her palm.  She did not have any chills last  night.  Review of Systems: Review of Systems  Constitutional: Negative for chills, diaphoresis and fever.  Musculoskeletal: Positive for joint pain.    Allergies  Allergen Reactions  . Bactrim Swelling    SWELLING OF MOUTH/FACE.  Marland Kitchen Lisinopril Swelling    SWELLING OF MOUTH/FACE.  Marland Kitchen Vasotec Swelling    SWELLING OF MOUTH/FACE.    OBJECTIVE: Vitals:   12/03/17 1500 12/03/17 2038 12/04/17 0602 12/04/17 0934  BP: (!) 145/68 (!) 155/72 (!) 153/78 135/73  Pulse: 72 79 81 76  Resp: 16 16 16    Temp: 98.5 F (36.9 C) 98.7 F (37.1 C) 98.3 F (36.8 C) 98.3 F (36.8 C)  TempSrc: Oral Oral Oral Oral  SpO2: 98% 99% 100%   Weight:      Height:       Body mass index is 33.88 kg/m.  Physical Exam  Constitutional: She is oriented to person, place, and time.  She is resting quietly in bed.   Cardiovascular: Normal rate, regular rhythm and normal heart sounds.  No murmur heard. Pulmonary/Chest: Effort normal and breath sounds normal.  Musculoskeletal: She exhibits edema and tenderness.  I do not note any change in the swelling of her left wrist and hand overnight.  It is not as warm.  It  is not red.  Neurological: She is alert and oriented to person, place, and time.  Skin: No rash noted.  Psychiatric: She has a normal mood and affect.    Lab Results Lab Results  Component Value Date   WBC 14.4 (H) 12/01/2017   HGB 10.8 (L) 12/01/2017   HCT 34.2 (L) 12/01/2017   MCV 77.2 (L) 12/01/2017   PLT 294 12/01/2017    Lab Results  Component Value Date   CREATININE 0.68 12/01/2017   BUN 11 12/01/2017   NA 137 12/01/2017   K 3.1 (L) 12/01/2017   CL 104 12/01/2017   CO2 24 12/01/2017    Lab Results  Component Value Date   ALT 14 12/01/2017   AST 18 12/01/2017   ALKPHOS 51 12/01/2017   BILITOT 0.7 12/01/2017     Microbiology: Recent Results (from the past 240 hour(s))  Blood culture (routine x 2)     Status: None (Preliminary result)   Collection Time: 11/30/17  2:33 PM    Result Value Ref Range Status   Specimen Description BLOOD RIGHT HAND  Final   Special Requests   Final    BOTTLES DRAWN AEROBIC AND ANAEROBIC Blood Culture adequate volume   Culture   Final    NO GROWTH 4 DAYS Performed at [facility], [place] [address] [place], [place] [address]    Report Status PENDING  Incomplete  Blood culture (routine x 2)     Status: Abnormal   Collection Time: 11/30/17  2:51 PM  Result Value Ref Range Status   Specimen Description BLOOD RIGHT WRIST  Final   Special Requests   Final    BOTTLES DRAWN AEROBIC AND ANAEROBIC Blood Culture results may not be optimal due to an inadequate volume of blood received in culture bottles   Culture  Setup Time   Final    AEROBIC BOTTLE ONLY GRAM POSITIVE COCCI CRITICAL RESULT CALLED TO, READ BACK BY AND VERIFIED WITH: Mathews Robinsons [employer] 12/01/17 2006 JDW Performed at [facility], [address], [place], [place] [address]    Culture STAPHYLOCOCCUS AUREUS (A)  Final   Report Status 12/03/2017 FINAL  Final   Organism ID, Bacteria STAPHYLOCOCCUS AUREUS  Final      Susceptibility   Staphylococcus aureus - MIC*    CIPROFLOXACIN <=0.5 SENSITIVE Sensitive     ERYTHROMYCIN <=0.25 SENSITIVE Sensitive     GENTAMICIN <=0.5 SENSITIVE Sensitive     OXACILLIN <=0.25 SENSITIVE Sensitive     TETRACYCLINE <=1 SENSITIVE Sensitive     VANCOMYCIN <=0.5 SENSITIVE Sensitive     TRIMETH/SULFA <=10 SENSITIVE Sensitive     CLINDAMYCIN <=0.25 SENSITIVE Sensitive     RIFAMPIN <=0.5 SENSITIVE Sensitive     Inducible Clindamycin NEGATIVE Sensitive     * STAPHYLOCOCCUS AUREUS  Blood Culture ID Panel (Reflexed)     Status: Abnormal   Collection Time: 11/30/17  2:51 PM  Result Value Ref Range Status   Enterococcus species NOT DETECTED NOT DETECTED Final   Listeria monocytogenes NOT DETECTED NOT DETECTED Final   Staphylococcus species DETECTED (A) NOT DETECTED Final    Comment: CRITICAL RESULT CALLED TO, READ BACK BY AND VERIFIED  WITH: K PERKINS PHARMD 12/01/17 2006 JDW    Staphylococcus aureus DETECTED (A) NOT DETECTED Final    Comment: Methicillin (oxacillin) susceptible Staphylococcus aureus (MSSA). Preferred therapy is anti staphylococcal beta lactam antibiotic (Cefazolin or Nafcillin), unless clinically contraindicated. CRITICAL RESULT CALLED TO, READ BACK BY AND VERIFIED WITH: K  PERKINS PHARMD 12/01/17 2006 JDW    Methicillin resistance NOT DETECTED NOT DETECTED Final   Streptococcus species NOT DETECTED NOT DETECTED Final   Streptococcus agalactiae NOT DETECTED NOT DETECTED Final   Streptococcus pneumoniae NOT DETECTED NOT DETECTED Final   Streptococcus pyogenes NOT DETECTED NOT DETECTED Final   Acinetobacter baumannii NOT DETECTED NOT DETECTED Final   Enterobacteriaceae species NOT DETECTED NOT DETECTED Final   Enterobacter cloacae complex NOT DETECTED NOT DETECTED Final   Escherichia coli NOT DETECTED NOT DETECTED Final   Klebsiella oxytoca NOT DETECTED NOT DETECTED Final   Klebsiella pneumoniae NOT DETECTED NOT DETECTED Final   Proteus species NOT DETECTED NOT DETECTED Final   Serratia marcescens NOT DETECTED NOT DETECTED Final   Haemophilus influenzae NOT DETECTED NOT DETECTED Final   Neisseria meningitidis NOT DETECTED NOT DETECTED Final   Pseudomonas aeruginosa NOT DETECTED NOT DETECTED Final   Candida albicans NOT DETECTED NOT DETECTED Final   Candida glabrata NOT DETECTED NOT DETECTED Final   Candida krusei NOT DETECTED NOT DETECTED Final   Candida parapsilosis NOT DETECTED NOT DETECTED Final   Candida tropicalis NOT DETECTED NOT DETECTED Final    Comment: Performed at [facility], [place] [address], [place], [place] [address]  Body fluid culture     Status: None (Preliminary result)   Collection Time: 12/01/17 11:53 AM  Result Value Ref Range Status   Specimen Description SYNOVIAL LEFT WRIST  Final   Special Requests NONE  Final   Gram Stain   Final    RARE WBC PRESENT,BOTH PMN AND  MONONUCLEAR NO ORGANISMS SEEN    Culture   Final    NO GROWTH 3 DAYS Performed at [facility], [place] [address] [place], [place] [address]    Report Status PENDING  Incomplete  Culture, blood (routine x 2)     Status: None (Preliminary result)   Collection Time: 12/02/17  8:57 AM  Result Value Ref Range Status   Specimen Description BLOOD RIGHT HAND  Final   Special Requests   Final    BOTTLES DRAWN AEROBIC AND ANAEROBIC Blood Culture adequate volume   Culture   Final    NO GROWTH 2 DAYS Performed at [facility], [place] [address], [place], [place] [address]    Report Status PENDING  Incomplete  Culture, blood (routine x 2)     Status: None (Preliminary result)   Collection Time: 12/02/17  9:08 AM  Result Value Ref Range Status   Specimen Description BLOOD RIGHT ARM  Final   Special Requests   Final    BOTTLES DRAWN AEROBIC AND ANAEROBIC Blood Culture adequate volume   Culture   Final    NO GROWTH 2 DAYS Performed at [facility], [address] [place], [place] [address]    Report Status PENDING  Incomplete  MRSA PCR Screening     Status: None   Collection Time: 12/04/17  6:12 AM  Result Value Ref Range Status   MRSA by PCR NEGATIVE NEGATIVE Final    Comment:        The GeneXpert MRSA Assay (FDA approved for NASAL specimens only), is one component of a comprehensive MRSA colonization surveillance program. It is not intended to diagnose MRSA infection nor to guide or monitor treatment for MRSA infections. Performed at [facility], [place] [address], [place], [place] [address]     Michel Bickers, [place] [facility] [phone]  pager   [phone] cell 12/04/2017, 11:01 AM

## 2017-12-04 NOTE — Progress Notes (Signed)
Fluid culture remains negative  Patient reports no significant pain, and but swelling/tightness persists, although less than before.  Hand also found dependent today. No significant warmth.  No significant pain with PROM of all joints of hand and wrist.  It seems as if she is improving sufficiently to warrant no surgical drainage. I applied ACE to help with edema, and patient will ask husband to bring her compression glove/sleeve from home.  OT for ROM & edema control as her digits remain swollen and stiff  Will continue to follow clinically this weekend.

## 2017-12-04 NOTE — Plan of Care (Signed)
  Problem: Education: Goal: Knowledge of General Education information will improve Description: Including pain rating scale, medication(s)/side effects and non-pharmacologic comfort measures Outcome: Progressing   Problem: Health Behavior/Discharge Planning: Goal: Ability to manage health-related needs will improve Outcome: Progressing   Problem: Clinical Measurements: Goal: Will remain free from infection Outcome: Progressing Goal: Diagnostic test results will improve Outcome: Progressing Goal: Respiratory complications will improve Outcome: Progressing Goal: Cardiovascular complication will be avoided Outcome: Progressing   Problem: Activity: Goal: Risk for activity intolerance will decrease Outcome: Progressing   Problem: Nutrition: Goal: Adequate nutrition will be maintained Outcome: Progressing   Problem: Coping: Goal: Level of anxiety will decrease Outcome: Progressing   Problem: Elimination: Goal: Will not experience complications related to bowel motility Outcome: Progressing Goal: Will not experience complications related to urinary retention Outcome: Progressing   Problem: Pain Managment: Goal: General experience of comfort will improve Outcome: Progressing   Problem: Safety: Goal: Ability to remain free from injury will improve Outcome: Progressing   Problem: Skin Integrity: Goal: Risk for impaired skin integrity will decrease Outcome: Progressing   

## 2017-12-04 NOTE — Progress Notes (Signed)
I stopped NPO as I am unable to examine this patient until this afternoon. If clinical concern exists that she has worsened and may require surgery today, please make NPO after breakfast.  Micheline Rough, MD Hand Surgery

## 2017-12-04 NOTE — Progress Notes (Signed)
PROGRESS NOTE  Tami Schmidt:[id number] DOB: 1932-07-01 DOA: 11/30/2017 PCP: Donald Prose, MD  HPI/brief narrative  Tami Schmidt is a 84 y.o. year old female with medical history significant for breast cancer status post bilateral mastectomy recurrent lymphedema, type 2 diabetes, HTN, and HLD who presented on 11/30/2017 with left hand swelling and pain for the past week and was found to have extensive left wrist tenosynovitis concerning for likely infectious etiology.Extensive tenosynovitis on MRI of left wrist ultrasound-guided aspiration of flexor tendons and carpus on 8/20 (the wrist joint was not aspirated).  Blood cultures from 8/19 growing MSSA.  Empirically started on vancomycin cefepime on day of admission.  Switched to cefazolin to cover MSSA bacteria on 8/21.  Subjective Reports improvement in left wrist pain and swelling.  No fevers or chills  Assessment/Plan:  1. Extensive left wrist tenosynovitis, suspect infectious etiology with concern for MSSA bacteremia, stable.  Afebrile, reports improvement in wrist pain   only 6000 WBC on fluid analysis of flexor tendons(wrist joint was not aspirated per Ortho) however MSSA in 1 of 2 blood cultures.  Appreciate ID recommendations.  Following repeat blood cultures, TTE clear but recommend TEE, will obtain PICC line once cultures remain negative.   On cefazolin, pending joint aspiration cultures   2. Type 2 diabetes, A1c 6.6% (12/2016).  Holding home metformin.  Continue home NPH 40 units with breakfast  3. Hypertension, currently at goal.  Continue home amlodipine.  Holding home losartan/HCTZ while on NSAIDs  Code Status: Full code  Family Communication: Husband at bedside  Disposition Plan: needs TEE, will need PICC placed once cultures remain negative   Consultants:  Orthopedics, infectious disease  Procedures:  8/20 ultrasound guided aspiration of flexor tendons and carpus (left)  12/03/2017 TTE  pending  Antimicrobials: Anti-infectives (From admission, onward)   Start     Dose/Rate Route Frequency Ordered Stop   12/02/17 1400  ceFAZolin (ANCEF) IVPB 2g/100 mL premix     2 g 200 mL/hr over 30 Minutes Intravenous Every 8 hours 12/02/17 0853     11/30/17 2300  ceFEPIme (MAXIPIME) 1 g in sodium chloride 0.9 % 100 mL IVPB  Status:  Discontinued     1 g 200 mL/hr over 30 Minutes Intravenous Every 8 hours 11/30/17 2242 12/02/17 0824   11/30/17 2300  vancomycin (VANCOCIN) IVPB 750 mg/150 ml premix  Status:  Discontinued     750 mg 150 mL/hr over 60 Minutes Intravenous 2 times daily 11/30/17 2242 11/30/17 2243   11/30/17 1430  ceFEPIme (MAXIPIME) 1 g in sodium chloride 0.9 % 100 mL IVPB     1 g 200 mL/hr over 30 Minutes Intravenous  Once 11/30/17 1424 11/30/17 1629   11/30/17 1430  vancomycin (VANCOCIN) 1,500 mg in sodium chloride 0.9 % 500 mL IVPB  Status:  Discontinued     1,500 mg 250 mL/hr over 120 Minutes Intravenous Every 24 hours 11/30/17 1427 12/02/17 0824        Cultures:  8/19, 1 of 2 blood cultures growing MSSA  8/21 pending blood cultures  Telemetry: No  DVT prophylaxis: Heparin   Objective: Vitals:   12/03/17 1500 12/03/17 2038 12/04/17 0602 12/04/17 0934  BP: (!) 145/68 (!) 155/72 (!) 153/78 135/73  Pulse: 72 79 81 76  Resp: 16 16 16    Temp: 98.5 F (36.9 C) 98.7 F (37.1 C) 98.3 F (36.8 C) 98.3 F (36.8 C)  TempSrc: Oral Oral Oral Oral  SpO2: 98% 99% 100%   Weight:  Height:        Intake/Output Summary (Last 24 hours) at 12/04/2017 1229 Last data filed at 12/04/2017 0900 Gross per 24 hour  Intake 240 ml  Output -  Net 240 ml   Filed Weights   11/30/17 1407  Weight: 95.2 kg    Exam:  Constitutional:normal appearing female Eyes: EOMI, anicteric, normal conjunctivae ENMT: Oropharynx with moist mucous membranes, normal dentition Cardiovascular: RRR no MRGs, with no peripheral edema Respiratory: Normal respiratory effort, clear  breath sounds  Skin: Persistent left wrist and hand swelling.  Able to move digits with some difficulty.  Improvement in pain Neurologic: Grossly no focal neuro deficit. Psychiatric:Appropriate affect, and mood. Mental status AAOx3  Data Reviewed: CBC: Recent Labs  Lab 11/30/17 1212 12/01/17 0756  WBC 14.0* 14.4*  HGB 11.7* 10.8*  HCT 38.1 34.2*  MCV 79.0 77.2*  PLT 331 010   Basic Metabolic Panel: Recent Labs  Lab 11/30/17 1212 12/01/17 0756  NA 135 137  K 3.8 3.1*  CL 99 104  CO2 22 24  GLUCOSE 150* 95  BUN 10 11  CREATININE 0.74 0.68  CALCIUM 8.7* 7.8*   GFR: Estimated Creatinine Clearance: 62 mL/min (by C-G formula based on SCr of 0.68 mg/dL). Liver Function Tests: Recent Labs  Lab 12/01/17 0756  AST 18  ALT 14  ALKPHOS 51  BILITOT 0.7  PROT 6.0*  ALBUMIN 2.5*   No results for input(s): LIPASE, AMYLASE in the last 168 hours. No results for input(s): AMMONIA in the last 168 hours. Coagulation Profile: No results for input(s): INR, PROTIME in the last 168 hours. Cardiac Enzymes: No results for input(s): CKTOTAL, CKMB, CKMBINDEX, TROPONINI in the last 168 hours. BNP (last 3 results) No results for input(s): PROBNP in the last 8760 hours. HbA1C: No results for input(s): HGBA1C in the last 72 hours. CBG: Recent Labs  Lab 12/03/17 0737 12/03/17 1154 12/03/17 1619 12/03/17 2204 12/04/17 0748  GLUCAP 71 110* 126* 120* 115*   Lipid Profile: No results for input(s): CHOL, HDL, LDLCALC, TRIG, CHOLHDL, LDLDIRECT in the last 72 hours. Thyroid Function Tests: No results for input(s): TSH, T4TOTAL, FREET4, T3FREE, THYROIDAB in the last 72 hours. Anemia Panel: No results for input(s): VITAMINB12, FOLATE, FERRITIN, TIBC, IRON, RETICCTPCT in the last 72 hours. Urine analysis:    Component Value Date/Time   COLORURINE YELLOW 12/02/2017 0615   APPEARANCEUR HAZY (A) 12/02/2017 0615   LABSPEC 1.017 12/02/2017 0615   PHURINE 6.0 12/02/2017 0615   GLUCOSEU  NEGATIVE 12/02/2017 0615   HGBUR SMALL (A) 12/02/2017 0615   BILIRUBINUR NEGATIVE 12/02/2017 0615   KETONESUR NEGATIVE 12/02/2017 0615   PROTEINUR NEGATIVE 12/02/2017 0615   UROBILINOGEN 0.2 04/26/2010 0840   NITRITE NEGATIVE 12/02/2017 0615   LEUKOCYTESUR NEGATIVE 12/02/2017 0615   Sepsis Labs: @LABRCNTIP (procalcitonin:4,lacticidven:4)  ) Recent Results (from the past 240 hour(s))  Blood culture (routine x 2)     Status: None (Preliminary result)   Collection Time: 11/30/17  2:33 PM  Result Value Ref Range Status   Specimen Description BLOOD RIGHT HAND  Final   Special Requests   Final    BOTTLES DRAWN AEROBIC AND ANAEROBIC Blood Culture adequate volume   Culture   Final    NO GROWTH 4 DAYS Performed at [facility], [place] [address], [place], [place] [address]    Report Status PENDING  Incomplete  Blood culture (routine x 2)     Status: Abnormal   Collection Time: 11/30/17  2:51 PM  Result  Value Ref Range Status   Specimen Description BLOOD RIGHT WRIST  Final   Special Requests   Final    BOTTLES DRAWN AEROBIC AND ANAEROBIC Blood Culture results may not be optimal due to an inadequate volume of blood received in culture bottles   Culture  Setup Time   Final    AEROBIC BOTTLE ONLY GRAM POSITIVE COCCI CRITICAL RESULT CALLED TO, READ BACK BY AND VERIFIED WITH: Mathews [facility] 12/01/17 2006 JDW Performed at [facility], [place] [address] [place], [place] [address]    Culture STAPHYLOCOCCUS AUREUS (A)  Final   Report Status 12/03/2017 FINAL  Final   Organism ID, Bacteria STAPHYLOCOCCUS AUREUS  Final      Susceptibility   Staphylococcus aureus - MIC*    CIPROFLOXACIN <=0.5 SENSITIVE Sensitive     ERYTHROMYCIN <=0.25 SENSITIVE Sensitive     GENTAMICIN <=0.5 SENSITIVE Sensitive     OXACILLIN <=0.25 SENSITIVE Sensitive     TETRACYCLINE <=1 SENSITIVE Sensitive     VANCOMYCIN <=0.5 SENSITIVE Sensitive     TRIMETH/SULFA <=10 SENSITIVE Sensitive     CLINDAMYCIN <=0.25  SENSITIVE Sensitive     RIFAMPIN <=0.5 SENSITIVE Sensitive     Inducible Clindamycin NEGATIVE Sensitive     * STAPHYLOCOCCUS AUREUS  Blood Culture ID Panel (Reflexed)     Status: Abnormal   Collection Time: 11/30/17  2:51 PM  Result Value Ref Range Status   Enterococcus species NOT DETECTED NOT DETECTED Final   Listeria monocytogenes NOT DETECTED NOT DETECTED Final   Staphylococcus species DETECTED (A) NOT DETECTED Final    Comment: CRITICAL RESULT CALLED TO, READ BACK BY AND VERIFIED WITH: K PERKINS PHARMD 12/01/17 2006 JDW    Staphylococcus aureus DETECTED (A) NOT DETECTED Final    Comment: Methicillin (oxacillin) susceptible Staphylococcus aureus (MSSA). Preferred therapy is anti staphylococcal beta lactam antibiotic (Cefazolin or Nafcillin), unless clinically contraindicated. CRITICAL RESULT CALLED TO, READ BACK BY AND VERIFIED WITH: K PERKINS PHARMD 12/01/17 2006 JDW    Methicillin resistance NOT DETECTED NOT DETECTED Final   Streptococcus species NOT DETECTED NOT DETECTED Final   Streptococcus agalactiae NOT DETECTED NOT DETECTED Final   Streptococcus pneumoniae NOT DETECTED NOT DETECTED Final   Streptococcus pyogenes NOT DETECTED NOT DETECTED Final   Acinetobacter baumannii NOT DETECTED NOT DETECTED Final   Enterobacteriaceae species NOT DETECTED NOT DETECTED Final   Enterobacter cloacae complex NOT DETECTED NOT DETECTED Final   Escherichia coli NOT DETECTED NOT DETECTED Final   Klebsiella oxytoca NOT DETECTED NOT DETECTED Final   Klebsiella pneumoniae NOT DETECTED NOT DETECTED Final   Proteus species NOT DETECTED NOT DETECTED Final   Serratia marcescens NOT DETECTED NOT DETECTED Final   Haemophilus influenzae NOT DETECTED NOT DETECTED Final   Neisseria meningitidis NOT DETECTED NOT DETECTED Final   Pseudomonas aeruginosa NOT DETECTED NOT DETECTED Final   Candida albicans NOT DETECTED NOT DETECTED Final   Candida glabrata NOT DETECTED NOT DETECTED Final   Candida krusei NOT  DETECTED NOT DETECTED Final   Candida parapsilosis NOT DETECTED NOT DETECTED Final   Candida tropicalis NOT DETECTED NOT DETECTED Final    Comment: Performed at [facility], [place] [address] [place], [place] [address]  Body fluid culture     Status: None (Preliminary result)   Collection Time: 12/01/17 11:53 AM  Result Value Ref Range Status   Specimen Description SYNOVIAL LEFT WRIST  Final   Special Requests NONE  Final   Gram Stain   Final  RARE WBC PRESENT,BOTH PMN AND MONONUCLEAR NO ORGANISMS SEEN    Culture   Final    NO GROWTH 3 DAYS Performed at [facility], [place] [address] [place], [place] [address]    Report Status PENDING  Incomplete  Culture, blood (routine x 2)     Status: None (Preliminary result)   Collection Time: 12/02/17  8:57 AM  Result Value Ref Range Status   Specimen Description BLOOD RIGHT HAND  Final   Special Requests   Final    BOTTLES DRAWN AEROBIC AND ANAEROBIC Blood Culture adequate volume   Culture   Final    NO GROWTH 2 DAYS Performed at [facility], [place] [address] [place], [place] [address]    Report Status PENDING  Incomplete  Culture, blood (routine x 2)     Status: None (Preliminary result)   Collection Time: 12/02/17  9:08 AM  Result Value Ref Range Status   Specimen Description BLOOD RIGHT ARM  Final   Special Requests   Final    BOTTLES DRAWN AEROBIC AND ANAEROBIC Blood Culture adequate volume   Culture   Final    NO GROWTH 2 DAYS Performed at [facility], [address], [place], [place] [address]    Report Status PENDING  Incomplete  MRSA PCR Screening     Status: None   Collection Time: 12/04/17  6:12 AM  Result Value Ref Range Status   MRSA by PCR NEGATIVE NEGATIVE Final    Comment:        The GeneXpert MRSA Assay (FDA approved for NASAL specimens only), is one component of a comprehensive MRSA colonization surveillance program. It is not intended to diagnose MRSA infection nor to guide  or monitor treatment for MRSA infections. Performed at [facility], [place] [address] [place], [place] [address]       Studies: No results found.  Scheduled Meds: . acetaminophen  650 mg Oral Q6H  . amLODipine  10 mg Oral Q breakfast  . aspirin EC  81 mg Oral Daily  . heparin  5,000 Units Subcutaneous Q8H  . insulin aspart  0-15 Units Subcutaneous TID WC  . insulin aspart  0-5 Units Subcutaneous QHS  . insulin NPH Human  40 Units Subcutaneous Q breakfast  . meloxicam  15 mg Oral Q breakfast  . omega-3 acid ethyl esters  1 g Oral Q breakfast  . pantoprazole  40 mg Oral Daily  . tamoxifen  20 mg Oral Daily    Continuous Infusions: .  ceFAZolin (ANCEF) IV 2 g (12/04/17 0624)     LOS: 4 days     Desiree Hane, MD [facility] Pager [phone]  If 7PM-7AM, please contact night-coverage [email or web address] Password [facility] 12/04/2017, 12:29 PM

## 2017-12-04 NOTE — Progress Notes (Signed)
RUE 20g IV  flushes well. Saline locked and clamped. No complications observed. Pt reports pain to site. Does not want removed until a new IV can be placed via ultrasound. IV consult obtained.

## 2017-12-05 DIAGNOSIS — E119 Type 2 diabetes mellitus without complications: Secondary | ICD-10-CM

## 2017-12-05 LAB — GLUCOSE, CAPILLARY
GLUCOSE-CAPILLARY: 107 mg/dL — AB (ref 70–99)
GLUCOSE-CAPILLARY: 89 mg/dL (ref 70–99)
Glucose-Capillary: 126 mg/dL — ABNORMAL HIGH (ref 70–99)
Glucose-Capillary: 77 mg/dL (ref 70–99)

## 2017-12-05 LAB — CULTURE, BLOOD (ROUTINE X 2)
Culture: NO GROWTH
Special Requests: ADEQUATE

## 2017-12-05 NOTE — Progress Notes (Signed)
PROGRESS NOTE  LAPORSCHE HOEGER ZLD:[id number] DOB: Apr 15, 1932 DOA: 11/30/2017 PCP: Donald Prose, MD  HPI/brief narrative  Tami Schmidt is a 84 y.o. year old female with medical history significant for breast cancer status post bilateral mastectomy recurrent lymphedema, type 2 diabetes, HTN, and HLD who presented on 11/30/2017 with left hand swelling and pain for the past week and was found to have extensive left wrist tenosynovitis concerning for likely infectious etiology.Extensive tenosynovitis on MRI of left wrist ultrasound-guided aspiration of flexor tendons and carpus on 8/20 (the wrist joint was not aspirated).  Blood cultures from 8/19 growing MSSA.  Empirically started on vancomycin cefepime on day of admission.  Switched to cefazolin to cover MSSA bacteria on 8/21.  Subjective Better wrist pain control. Feels less swelling  Assessment/Plan:  1. Extensive left wrist tenosynovitis, suspect infectious etiology with  MSSA bacteremia, stable.  Afebrile, reports improvement in wrist pain, looks bettter too. Only 6000 WBC on fluid analysis of flexor tendons(wrist joint was not aspirated per Ortho) however MSSA in 1 of 2 blood cultures.  ID following, repeat blood cultures no growth so farr, TTE clear but recommend TEE on 8/26(after weekend), will obtain PICC line once cultures remain negative.   On cefazolin, pending culture data  2. Type 2 diabetes, A1c 6.6% (12/2016).  Holding home metformin.  Continue home NPH 40 units with breakfast  3. Hypertension, currently at goal.  Continue home amlodipine.  Holding home losartan/HCTZ while on NSAIDs  Code Status: Full code  Family Communication: Husband at bedside  Disposition Plan: needs TEE, NPO midnight for 8/26, will need PICC placed once cultures remain negative   Consultants:  Orthopedics, infectious disease  Procedures:  8/20 ultrasound guided aspiration of flexor tendons and carpus (left)  12/03/2017 TTE  pending  Antimicrobials: Anti-infectives (From admission, onward)   Start     Dose/Rate Route Frequency Ordered Stop   12/02/17 1400  ceFAZolin (ANCEF) IVPB 2g/100 mL premix     2 g 200 mL/hr over 30 Minutes Intravenous Every 8 hours 12/02/17 0853     11/30/17 2300  ceFEPIme (MAXIPIME) 1 g in sodium chloride 0.9 % 100 mL IVPB  Status:  Discontinued     1 g 200 mL/hr over 30 Minutes Intravenous Every 8 hours 11/30/17 2242 12/02/17 0824   11/30/17 2300  vancomycin (VANCOCIN) IVPB 750 mg/150 ml premix  Status:  Discontinued     750 mg 150 mL/hr over 60 Minutes Intravenous 2 times daily 11/30/17 2242 11/30/17 2243   11/30/17 1430  ceFEPIme (MAXIPIME) 1 g in sodium chloride 0.9 % 100 mL IVPB     1 g 200 mL/hr over 30 Minutes Intravenous  Once 11/30/17 1424 11/30/17 1629   11/30/17 1430  vancomycin (VANCOCIN) 1,500 mg in sodium chloride 0.9 % 500 mL IVPB  Status:  Discontinued     1,500 mg 250 mL/hr over 120 Minutes Intravenous Every 24 hours 11/30/17 1427 12/02/17 0824        Cultures:  8/19, 1 of 2 blood cultures growing MSSA  8/21 pending blood cultures  Telemetry: No  DVT prophylaxis: Heparin   Objective: Vitals:   12/04/17 0934 12/04/17 1437 12/04/17 2037 12/05/17 0610  BP: 135/73 129/69 (!) 142/64 (!) 144/65  Pulse: 76 76 79 76  Resp:  17 18 16   Temp: 98.3 F (36.8 C) 98.7 F (37.1 C) 98.1 F (36.7 C) 98.3 F (36.8 C)  TempSrc: Oral Oral Oral Oral  SpO2:  100% 97% 96%  Weight:  Height:        Intake/Output Summary (Last 24 hours) at 12/05/2017 1509 Last data filed at 12/05/2017 1430 Gross per 24 hour  Intake 580 ml  Output -  Net 580 ml   Filed Weights   11/30/17 1407  Weight: 95.2 kg    Exam:  Constitutional:normal appearing female Eyes: EOMI, anicteric, normal conjunctivae ENMT: Oropharynx with moist mucous membranes, normal dentition Cardiovascular: RRR no MRGs, with no peripheral edema Respiratory: Normal respiratory effort, clear breath  sounds  Skin: Noticeably less wrist swelling, in ace bandage.   Neurologic: Grossly no focal neuro deficit. Psychiatric:Appropriate affect, and mood. Mental status AAOx3  Data Reviewed: CBC: Recent Labs  Lab 11/30/17 1212 12/01/17 0756  WBC 14.0* 14.4*  HGB 11.7* 10.8*  HCT 38.1 34.2*  MCV 79.0 77.2*  PLT 331 301   Basic Metabolic Panel: Recent Labs  Lab 11/30/17 1212 12/01/17 0756  NA 135 137  K 3.8 3.1*  CL 99 104  CO2 22 24  GLUCOSE 150* 95  BUN 10 11  CREATININE 0.74 0.68  CALCIUM 8.7* 7.8*   GFR: Estimated Creatinine Clearance: 62 mL/min (by C-G formula based on SCr of 0.68 mg/dL). Liver Function Tests: Recent Labs  Lab 12/01/17 0756  AST 18  ALT 14  ALKPHOS 51  BILITOT 0.7  PROT 6.0*  ALBUMIN 2.5*   No results for input(s): LIPASE, AMYLASE in the last 168 hours. No results for input(s): AMMONIA in the last 168 hours. Coagulation Profile: No results for input(s): INR, PROTIME in the last 168 hours. Cardiac Enzymes: No results for input(s): CKTOTAL, CKMB, CKMBINDEX, TROPONINI in the last 168 hours. BNP (last 3 results) No results for input(s): PROBNP in the last 8760 hours. HbA1C: No results for input(s): HGBA1C in the last 72 hours. CBG: Recent Labs  Lab 12/04/17 1207 12/04/17 1636 12/04/17 2047 12/05/17 0743 12/05/17 1127  GLUCAP 146* 128* 170* 107* 126*   Lipid Profile: No results for input(s): CHOL, HDL, LDLCALC, TRIG, CHOLHDL, LDLDIRECT in the last 72 hours. Thyroid Function Tests: No results for input(s): TSH, T4TOTAL, FREET4, T3FREE, THYROIDAB in the last 72 hours. Anemia Panel: No results for input(s): VITAMINB12, FOLATE, FERRITIN, TIBC, IRON, RETICCTPCT in the last 72 hours. Urine analysis:    Component Value Date/Time   COLORURINE YELLOW 12/02/2017 0615   APPEARANCEUR HAZY (A) 12/02/2017 0615   LABSPEC 1.017 12/02/2017 0615   PHURINE 6.0 12/02/2017 0615   GLUCOSEU NEGATIVE 12/02/2017 0615   HGBUR SMALL (A) 12/02/2017 0615    BILIRUBINUR NEGATIVE 12/02/2017 0615   KETONESUR NEGATIVE 12/02/2017 0615   PROTEINUR NEGATIVE 12/02/2017 0615   UROBILINOGEN 0.2 04/26/2010 0840   NITRITE NEGATIVE 12/02/2017 0615   LEUKOCYTESUR NEGATIVE 12/02/2017 0615   Sepsis Labs: @LABRCNTIP (procalcitonin:4,lacticidven:4)  ) Recent Results (from the past 240 hour(s))  Blood culture (routine x 2)     Status: None   Collection Time: 11/30/17  2:33 PM  Result Value Ref Range Status   Specimen Description BLOOD RIGHT HAND  Final   Special Requests   Final    BOTTLES DRAWN AEROBIC AND ANAEROBIC Blood Culture adequate volume   Culture   Final    NO GROWTH 5 DAYS Performed at [facility], [place] [address] [place], [place] [address]    Report Status 12/05/2017 FINAL  Final  Blood culture (routine x 2)     Status: Abnormal   Collection Time: 11/30/17  2:51 PM  Result Value Ref Range Status   Specimen Description BLOOD RIGHT  WRIST  Final   Special Requests   Final    BOTTLES DRAWN AEROBIC AND ANAEROBIC Blood Culture results may not be optimal due to an inadequate volume of blood received in culture bottles   Culture  Setup Time   Final    AEROBIC BOTTLE ONLY GRAM POSITIVE COCCI CRITICAL RESULT CALLED TO, READ BACK BY AND VERIFIED WITH: Mathews Robinsons [facility] 12/01/17 2006 JDW Performed at [facility], [place] [address], [place], [place] [address]    Culture STAPHYLOCOCCUS AUREUS (A)  Final   Report Status 12/03/2017 FINAL  Final   Organism ID, Bacteria STAPHYLOCOCCUS AUREUS  Final      Susceptibility   Staphylococcus aureus - MIC*    CIPROFLOXACIN <=0.5 SENSITIVE Sensitive     ERYTHROMYCIN <=0.25 SENSITIVE Sensitive     GENTAMICIN <=0.5 SENSITIVE Sensitive     OXACILLIN <=0.25 SENSITIVE Sensitive     TETRACYCLINE <=1 SENSITIVE Sensitive     VANCOMYCIN <=0.5 SENSITIVE Sensitive     TRIMETH/SULFA <=10 SENSITIVE Sensitive     CLINDAMYCIN <=0.25 SENSITIVE Sensitive     RIFAMPIN <=0.5 SENSITIVE Sensitive     Inducible  Clindamycin NEGATIVE Sensitive     * STAPHYLOCOCCUS AUREUS  Blood Culture ID Panel (Reflexed)     Status: Abnormal   Collection Time: 11/30/17  2:51 PM  Result Value Ref Range Status   Enterococcus species NOT DETECTED NOT DETECTED Final   Listeria monocytogenes NOT DETECTED NOT DETECTED Final   Staphylococcus species DETECTED (A) NOT DETECTED Final    Comment: CRITICAL RESULT CALLED TO, READ BACK BY AND VERIFIED WITH: K PERKINS PHARMD 12/01/17 2006 JDW    Staphylococcus aureus DETECTED (A) NOT DETECTED Final    Comment: Methicillin (oxacillin) susceptible Staphylococcus aureus (MSSA). Preferred therapy is anti staphylococcal beta lactam antibiotic (Cefazolin or Nafcillin), unless clinically contraindicated. CRITICAL RESULT CALLED TO, READ BACK BY AND VERIFIED WITH: K PERKINS PHARMD 12/01/17 2006 JDW    Methicillin resistance NOT DETECTED NOT DETECTED Final   Streptococcus species NOT DETECTED NOT DETECTED Final   Streptococcus agalactiae NOT DETECTED NOT DETECTED Final   Streptococcus pneumoniae NOT DETECTED NOT DETECTED Final   Streptococcus pyogenes NOT DETECTED NOT DETECTED Final   Acinetobacter baumannii NOT DETECTED NOT DETECTED Final   Enterobacteriaceae species NOT DETECTED NOT DETECTED Final   Enterobacter cloacae complex NOT DETECTED NOT DETECTED Final   Escherichia coli NOT DETECTED NOT DETECTED Final   Klebsiella oxytoca NOT DETECTED NOT DETECTED Final   Klebsiella pneumoniae NOT DETECTED NOT DETECTED Final   Proteus species NOT DETECTED NOT DETECTED Final   Serratia marcescens NOT DETECTED NOT DETECTED Final   Haemophilus influenzae NOT DETECTED NOT DETECTED Final   Neisseria meningitidis NOT DETECTED NOT DETECTED Final   Pseudomonas aeruginosa NOT DETECTED NOT DETECTED Final   Candida albicans NOT DETECTED NOT DETECTED Final   Candida glabrata NOT DETECTED NOT DETECTED Final   Candida krusei NOT DETECTED NOT DETECTED Final   Candida parapsilosis NOT DETECTED NOT  DETECTED Final   Candida tropicalis NOT DETECTED NOT DETECTED Final    Comment: Performed at [facility], [place] [address], [place], [place] [address]  Body fluid culture     Status: None   Collection Time: 12/01/17 11:53 AM  Result Value Ref Range Status   Specimen Description SYNOVIAL LEFT WRIST  Final   Special Requests NONE  Final   Gram Stain   Final    RARE WBC PRESENT,BOTH PMN AND MONONUCLEAR NO ORGANISMS SEEN  Culture   Final    NO GROWTH 3 DAYS Performed at [facility], [place] [address] [place], [place] [address]    Report Status 12/04/2017 FINAL  Final  Culture, blood (routine x 2)     Status: None (Preliminary result)   Collection Time: 12/02/17  8:57 AM  Result Value Ref Range Status   Specimen Description BLOOD RIGHT HAND  Final   Special Requests   Final    BOTTLES DRAWN AEROBIC AND ANAEROBIC Blood Culture adequate volume   Culture   Final    NO GROWTH 3 DAYS Performed at [facility], [place] [address], [place], [place] [address]    Report Status PENDING  Incomplete  Culture, blood (routine x 2)     Status: None (Preliminary result)   Collection Time: 12/02/17  9:08 AM  Result Value Ref Range Status   Specimen Description BLOOD RIGHT ARM  Final   Special Requests   Final    BOTTLES DRAWN AEROBIC AND ANAEROBIC Blood Culture adequate volume   Culture   Final    NO GROWTH 3 DAYS Performed at [facility], [address] [place], [place] [address]    Report Status PENDING  Incomplete  MRSA PCR Screening     Status: None   Collection Time: 12/04/17  6:12 AM  Result Value Ref Range Status   MRSA by PCR NEGATIVE NEGATIVE Final    Comment:        The GeneXpert MRSA Assay (FDA approved for NASAL specimens only), is one component of a comprehensive MRSA colonization surveillance program. It is not intended to diagnose MRSA infection nor to guide or monitor treatment for MRSA infections. Performed at [facility], [place] [address], [place], [place] [address]       Studies: No results found.  Scheduled Meds: . acetaminophen  650 mg Oral Q6H  . amLODipine  10 mg Oral Q breakfast  . aspirin EC  81 mg Oral Daily  . heparin  5,000 Units Subcutaneous Q8H  . insulin aspart  0-15 Units Subcutaneous TID WC  . insulin aspart  0-5 Units Subcutaneous QHS  . insulin NPH Human  40 Units Subcutaneous Q breakfast  . meloxicam  15 mg Oral Q breakfast  . omega-3 acid ethyl esters  1 g Oral Q breakfast  . pantoprazole  40 mg Oral Daily  . tamoxifen  20 mg Oral Daily    Continuous Infusions: .  ceFAZolin (ANCEF) IV 2 g (12/05/17 1403)     LOS: 5 days     Desiree Hane, MD [facility] Pager [phone]  If 7PM-7AM, please contact night-coverage [email or web address] Password [facility] 12/05/2017, 3:09 PM

## 2017-12-05 NOTE — Evaluation (Signed)
Occupational Therapy Evaluation Patient Details Name: Tami Schmidt MRN: [id number] DOB: 19-Dec-1932 Today's Date: 12/05/2017    History of Present Illness Pt is an 84 y.o. female with medical history significant for breast status post treatment with recurrent lymphedema in the L UE, bilateral mastectomy, history of diabetes, hypertension, and hyperlipidemia. She presented to the ED with pain and swelling on L hand and wrist. Admitted with L wrist tenosynovitis.    Clinical Impression   PTA, pt reports independence for basic ADL and functional mobility. She currently is limited by decreased balance and L wrist pain impacting her ability to participate in ADL at PLOF. Pt with minimal AROM L hand and significant edema as detailed below. She requires min assist for feeding and grooming tasks, min assist for UB ADL, and mod assist for LB ADL. Initiated HEP (with print-out in shadow chart) to address PROM, AROM, and edema management of L digits and wrist. Educated pt concerning strategies to prevent development of hypersensitivity in L hand. She verbalizes and demonstrates understanding. Recommend outpatient OT follow-up post-acute D/C. Will continue to follow while admitted.     Follow Up Recommendations  Outpatient OT    Equipment Recommendations  None recommended by OT    Recommendations for Other Services       Precautions / Restrictions Precautions Precautions: Fall Precaution Comments: shuffling gait Restrictions Weight Bearing Restrictions: No      Mobility Bed Mobility Overal bed mobility: Needs Assistance Bed Mobility: Supine to Sit     Supine to sit: Min assist     General bed mobility comments: Assist to raise trunk from [facility]  Transfers Overall transfer level: Needs assistance   Transfers: Sit to/from Stand Sit to Stand: Min guard         General transfer comment: Guarding assist for safety.     Balance Overall balance assessment: Mild deficits observed, not  formally tested                                         ADL either performed or assessed with clinical judgement   ADL Overall ADL's : Needs assistance/impaired Eating/Feeding: Sitting;Minimal assistance Eating/Feeding Details (indicate cue type and reason): assist to open containers Grooming: Supervision/safety;Standing   Upper Body Bathing: Minimal assistance;Sitting   Lower Body Bathing: Moderate assistance;Sit to/from stand   Upper Body Dressing : Minimal assistance;Sitting   Lower Body Dressing: Moderate assistance;Sit to/from stand   Toilet Transfer: Min guard;Ambulation Toilet Transfer Details (indicate cue type and reason): shuffling gait noted Toileting- Clothing Manipulation and Hygiene: Minimal assistance;Sit to/from stand Toileting - Clothing Manipulation Details (indicate cue type and reason): assist to pull underwear over hips     Functional mobility during ADLs: Min guard General ADL Comments: Pt limited in bimanual tasks due to decreased functional use of L wrist.      Vision Patient Visual Report: No change from baseline Vision Assessment?: No apparent visual deficits     Perception     Praxis      Pertinent Vitals/Pain Pain Assessment: Faces Faces Pain Scale: Hurts little more Pain Location: L wrist with passive ulnar deviation Pain Descriptors / Indicators: Aching;Grimacing;Sharp Pain Intervention(s): Limited activity within patient's tolerance;Monitored during session;Repositioned     Hand Dominance Right   Extremity/Trunk Assessment Upper Extremity Assessment Upper Extremity Assessment: LUE deficits/detail LUE Deficits / Details: In splint on my arrival. Indentations resolving within 10 minutes. Pt  with significant edema. Able to complete approximately 10% composite fist actively and 25% passively.    Lower Extremity Assessment Lower Extremity Assessment: Generalized weakness       Communication  Communication Communication: No difficulties   Cognition Arousal/Alertness: Awake/alert Behavior During Therapy: WFL for tasks assessed/performed Overall Cognitive Status: Within Functional Limits for tasks assessed                                     General Comments  Pt with edema to L wrist with indentations to skin on removal of L wrist splint. These resolved within 10 minutes with no skin irritation noted.     Exercises Exercises: Other exercises Other Exercises Other Exercises: Medbridge handout created for 10 repetitions each, 3 times per day. Educated pt concerning HEP for digit PROM in composite fist and extension, thumb MP extension and flexion, passive wrist flexion/extension, digit abduction/adduction, and gentle massage.  Other Exercises: Educated pt concerning sensory input to L hand to prevent development of hypersensitivity.    Shoulder Instructions      Home Living Family/patient expects to be discharged to:: Private residence Living Arrangements: Spouse/significant other Available Help at Discharge: Family;Available 24 hours/day Type of Home: House Home Access: Level entry     Home Layout: Two level Alternate Level Stairs-Number of Steps: 16   Bathroom Shower/Tub: Tub/shower unit;Walk-in shower   Bathroom Toilet: Handicapped height     Home Equipment: [place] - single point;Bedside commode;Grab bars - tub/shower;Hand held shower head;Adaptive equipment Adaptive Equipment: Reacher        Prior Functioning/Environment Level of Independence: Independent        Comments: Reports that she was moving slow due to recovery from hip surgery last year but was independent for basic ADL.         OT Problem List: Decreased strength;Decreased range of motion;Decreased activity tolerance;Impaired balance (sitting and/or standing);Pain;Impaired UE functional use      OT Treatment/Interventions: Self-care/ADL training;Therapeutic exercise;Energy  conservation;DME and/or AE instruction;Therapeutic activities;Patient/family education;Balance training    OT Goals(Current goals can be found in the care plan section) Acute Rehab OT Goals Patient Stated Goal: to get back to using her L hand OT Goal Formulation: With patient Time For Goal Achievement: 12/19/17 Potential to Achieve Goals: Good ADL Goals Pt Will Perform Grooming: with modified independence;standing Pt Will Perform Upper Body Dressing: with modified independence;sitting Pt Will Perform Lower Body Dressing: with modified independence;sit to/from stand Pt Will Transfer to Toilet: with modified independence;ambulating;regular height toilet Pt Will Perform Toileting - Clothing Manipulation and hygiene: with modified independence;sit to/from stand Pt/caregiver will Perform Home Exercise Program: Independently;Left upper extremity;Increased ROM;Increased strength;With written HEP provided  OT Frequency: Min 3X/week   Barriers to D/C:            Co-evaluation              AM-PAC PT "6 Clicks" Daily Activity     Outcome Measure Help from another person eating meals?: A Little Help from another person taking care of personal grooming?: A Little Help from another person toileting, which includes using toliet, bedpan, or urinal?: A Lot Help from another person bathing (including washing, rinsing, drying)?: A Lot Help from another person to put on and taking off regular upper body clothing?: A Little Help from another person to put on and taking off regular lower body clothing?: A Lot 6 Click Score: 15   End of  Session Equipment Utilized During Treatment: Gait belt;Rolling walker  Activity Tolerance: Patient tolerated treatment well Patient left: in chair;with call bell/phone within reach  OT Visit Diagnosis: Pain Pain - Right/Left: Left Pain - part of body: Hand;Arm                Time: 6967-8938 OT Time Calculation (min): 40 min Charges:  OT General Charges $OT  Visit: 1 Visit OT Evaluation $OT Eval Moderate Complexity: 1 Mod OT Treatments $Self Care/Home Management : 23-37 mins  Norman Herrlich, MS OTR/L  Pager: [place] A Vickie Melnik 12/05/2017, 10:19 AM

## 2017-12-05 NOTE — Plan of Care (Signed)
  Problem: Education: Goal: Knowledge of General Education information will improve Description Including pain rating scale, medication(s)/side effects and non-pharmacologic comfort measures Outcome: Progressing   Problem: Activity: Goal: Risk for activity intolerance will decrease Outcome: Progressing   Problem: Nutrition: Goal: Adequate nutrition will be maintained Outcome: Progressing   Problem: Elimination: Goal: Will not experience complications related to bowel motility Outcome: Progressing Goal: Will not experience complications related to urinary retention Outcome: Progressing   Problem: Safety: Goal: Ability to remain free from injury will improve Outcome: Progressing   Problem: Skin Integrity: Goal: Risk for impaired skin integrity will decrease Outcome: Progressing   

## 2017-12-05 NOTE — Progress Notes (Signed)
Left hand much less swollen, both volarly and dorsally, reports not painful. Using ACE and adding wrist brace for exercises to stabilize wrist and allow better digital ROM  Likely not to need any surgery if progress continues like this.  Antibiotic management per primary team/ID  As for hand function, continue OT here while inpatient.  Patient will need f/u with me within a couple days of d/c to check ROM and determine if continued outpatient hand therapy would be beneficial.  Micheline Rough, MD Hand Surgery

## 2017-12-06 DIAGNOSIS — E119 Type 2 diabetes mellitus without complications: Secondary | ICD-10-CM

## 2017-12-06 DIAGNOSIS — Z794 Long term (current) use of insulin: Secondary | ICD-10-CM

## 2017-12-06 LAB — GLUCOSE, CAPILLARY
GLUCOSE-CAPILLARY: 81 mg/dL (ref 70–99)
GLUCOSE-CAPILLARY: 131 mg/dL — AB (ref 70–99)
GLUCOSE-CAPILLARY: 135 mg/dL — AB (ref 70–99)
Glucose-Capillary: 105 mg/dL — ABNORMAL HIGH (ref 70–99)

## 2017-12-06 NOTE — Plan of Care (Signed)
  Problem: Education: Goal: Knowledge of General Education information will improve Description Including pain rating scale, medication(s)/side effects and non-pharmacologic comfort measures Outcome: Progressing   Problem: Health Behavior/Discharge Planning: Goal: Ability to manage health-related needs will improve Outcome: Progressing   Problem: Clinical Measurements: Goal: Respiratory complications will improve Outcome: Progressing   Problem: Activity: Goal: Risk for activity intolerance will decrease Outcome: Progressing   Problem: Nutrition: Goal: Adequate nutrition will be maintained Outcome: Not Progressing Note:  Patient's appetite has decreased due to "hospital food taste"   Problem: Safety: Goal: Ability to remain free from injury will improve Outcome: Progressing

## 2017-12-06 NOTE — Plan of Care (Signed)
  Problem: Clinical Measurements: Goal: Cardiovascular complication will be avoided Outcome: Progressing   Problem: Nutrition: Goal: Adequate nutrition will be maintained Outcome: Progressing   

## 2017-12-06 NOTE — Progress Notes (Signed)
PROGRESS NOTE  Tami Schmidt:[id number] DOB: 18-May-1932 DOA: 11/30/2017 PCP: Donald Prose, MD  HPI/brief narrative  Tami Schmidt is a 84 y.o. year old female with medical history significant for breast cancer status post bilateral mastectomy recurrent lymphedema, type 2 diabetes, HTN, and HLD who presented on 11/30/2017 with left hand swelling and pain for the past week and was found to have extensive left wrist tenosynovitis concerning for likely infectious etiology.Extensive tenosynovitis on MRI of left wrist ultrasound-guided aspiration of flexor tendons and carpus on 8/20 (the wrist joint was not aspirated).  Blood cultures from 8/19 growing MSSA.  Empirically started on vancomycin cefepime on day of admission.  Switched to cefazolin to cover MSSA bacteria on 8/21.  Subjective Better wrist pain control. Feels less swelling  Assessment/Plan:  1. Extensive left wrist tenosynovitis, suspect infectious etiology with  MSSA bacteremia, improve.  Significantly less swelling, improvement in pain.  Repeat blood cultures on 8/21 remain negative growth to date.  Awaiting TEE on 8/26 if unremarkable can obtain pick line to continue cefazolin.  Surgery and ID following  2. Type 2 diabetes, A1c 6.6% (12/2016).  Holding home metformin.  Continue home NPH 40 units with breakfast  3. Hypertension, currently at goal.  Continue home amlodipine.  Holding home losartan/HCTZ while on NSAIDs  Code Status: Full code  Family Communication: no family at bedside  Disposition Plan: needs TEE, NPO midnight for 8/26, will need PICC placed once cultures remain negative   Consultants:  Orthopedics, infectious disease  Procedures:  8/20 ultrasound guided aspiration of flexor tendons and carpus (left)  12/03/2017 TTE   Antimicrobials: Anti-infectives (From admission, onward)   Start     Dose/Rate Route Frequency Ordered Stop   12/02/17 1400  ceFAZolin (ANCEF) IVPB 2g/100 mL premix     2 g 200 mL/hr  over 30 Minutes Intravenous Every 8 hours 12/02/17 0853     11/30/17 2300  ceFEPIme (MAXIPIME) 1 g in sodium chloride 0.9 % 100 mL IVPB  Status:  Discontinued     1 g 200 mL/hr over 30 Minutes Intravenous Every 8 hours 11/30/17 2242 12/02/17 0824   11/30/17 2300  vancomycin (VANCOCIN) IVPB 750 mg/150 ml premix  Status:  Discontinued     750 mg 150 mL/hr over 60 Minutes Intravenous 2 times daily 11/30/17 2242 11/30/17 2243   11/30/17 1430  ceFEPIme (MAXIPIME) 1 g in sodium chloride 0.9 % 100 mL IVPB     1 g 200 mL/hr over 30 Minutes Intravenous  Once 11/30/17 1424 11/30/17 1629   11/30/17 1430  vancomycin (VANCOCIN) 1,500 mg in sodium chloride 0.9 % 500 mL IVPB  Status:  Discontinued     1,500 mg 250 mL/hr over 120 Minutes Intravenous Every 24 hours 11/30/17 1427 12/02/17 0824        Cultures:  8/19, 1 of 2 blood cultures growing MSSA  8/21 pending blood cultures  Telemetry: No  DVT prophylaxis: Heparin   Objective: Vitals:   12/05/17 0610 12/05/17 2103 12/06/17 0552 12/06/17 1422  BP: (!) 144/65 (!) 150/75 (!) 150/71 130/68  Pulse: 76 75 75 84  Resp: 16 17 16 16   Temp: 98.3 F (36.8 C) 98.6 F (37 C) 98.5 F (36.9 C) 98.1 F (36.7 C)  TempSrc: Oral Oral Oral Oral  SpO2: 96% 98% 100% 97%  Weight:      Height:        Intake/Output Summary (Last 24 hours) at 12/06/2017 1627 Last data filed at 12/06/2017 1200 Gross  per 24 hour  Intake 680 ml  Output -  Net 680 ml   Filed Weights   11/30/17 1407  Weight: 95.2 kg    Exam:  Constitutional:normal appearing female Eyes: EOMI, anicteric, normal conjunctivae ENMT: Oropharynx with moist mucous membranes, normal dentition Cardiovascular: RRR no MRGs, with no peripheral edema Respiratory: Normal respiratory effort, clear breath sounds  Skin: Noticeably less wrist swelling, in ace bandage.   Neurologic: Grossly no focal neuro deficit. Psychiatric:Appropriate affect, and mood. Mental status AAOx3  Data  Reviewed: CBC: Recent Labs  Lab 11/30/17 1212 12/01/17 0756  WBC 14.0* 14.4*  HGB 11.7* 10.8*  HCT 38.1 34.2*  MCV 79.0 77.2*  PLT 331 623   Basic Metabolic Panel: Recent Labs  Lab 11/30/17 1212 12/01/17 0756  NA 135 137  K 3.8 3.1*  CL 99 104  CO2 22 24  GLUCOSE 150* 95  BUN 10 11  CREATININE 0.74 0.68  CALCIUM 8.7* 7.8*   GFR: Estimated Creatinine Clearance: 62 mL/min (by C-G formula based on SCr of 0.68 mg/dL). Liver Function Tests: Recent Labs  Lab 12/01/17 0756  AST 18  ALT 14  ALKPHOS 51  BILITOT 0.7  PROT 6.0*  ALBUMIN 2.5*   No results for input(s): LIPASE, AMYLASE in the last 168 hours. No results for input(s): AMMONIA in the last 168 hours. Coagulation Profile: No results for input(s): INR, PROTIME in the last 168 hours. Cardiac Enzymes: No results for input(s): CKTOTAL, CKMB, CKMBINDEX, TROPONINI in the last 168 hours. BNP (last 3 results) No results for input(s): PROBNP in the last 8760 hours. HbA1C: No results for input(s): HGBA1C in the last 72 hours. CBG: Recent Labs  Lab 12/05/17 1127 12/05/17 1628 12/05/17 2059 12/06/17 0738 12/06/17 1130  GLUCAP 126* 77 89 81 105*   Lipid Profile: No results for input(s): CHOL, HDL, LDLCALC, TRIG, CHOLHDL, LDLDIRECT in the last 72 hours. Thyroid Function Tests: No results for input(s): TSH, T4TOTAL, FREET4, T3FREE, THYROIDAB in the last 72 hours. Anemia Panel: No results for input(s): VITAMINB12, FOLATE, FERRITIN, TIBC, IRON, RETICCTPCT in the last 72 hours. Urine analysis:    Component Value Date/Time   COLORURINE YELLOW 12/02/2017 0615   APPEARANCEUR HAZY (A) 12/02/2017 0615   LABSPEC 1.017 12/02/2017 0615   PHURINE 6.0 12/02/2017 0615   GLUCOSEU NEGATIVE 12/02/2017 0615   HGBUR SMALL (A) 12/02/2017 0615   BILIRUBINUR NEGATIVE 12/02/2017 0615   KETONESUR NEGATIVE 12/02/2017 0615   PROTEINUR NEGATIVE 12/02/2017 0615   UROBILINOGEN 0.2 04/26/2010 0840   NITRITE NEGATIVE 12/02/2017 0615    LEUKOCYTESUR NEGATIVE 12/02/2017 0615   Sepsis Labs: @LABRCNTIP (procalcitonin:4,lacticidven:4)  ) Recent Results (from the past 240 hour(s))  Blood culture (routine x 2)     Status: None   Collection Time: 11/30/17  2:33 PM  Result Value Ref Range Status   Specimen Description BLOOD RIGHT HAND  Final   Special Requests   Final    BOTTLES DRAWN AEROBIC AND ANAEROBIC Blood Culture adequate volume   Culture   Final    NO GROWTH 5 DAYS Performed at [facility], [place] [address] [place], [place] [address]    Report Status 12/05/2017 FINAL  Final  Blood culture (routine x 2)     Status: Abnormal   Collection Time: 11/30/17  2:51 PM  Result Value Ref Range Status   Specimen Description BLOOD RIGHT WRIST  Final   Special Requests   Final    BOTTLES DRAWN AEROBIC AND ANAEROBIC Blood Culture results may not  be optimal due to an inadequate volume of blood received in culture bottles   Culture  Setup Time   Final    AEROBIC BOTTLE ONLY GRAM POSITIVE COCCI CRITICAL RESULT CALLED TO, READ BACK BY AND VERIFIED WITH: Mathews Robinsons [facility] 12/01/17 2006 JDW Performed at [facility], [address], [place], [place] [address]    Culture STAPHYLOCOCCUS AUREUS (A)  Final   Report Status 12/03/2017 FINAL  Final   Organism ID, Bacteria STAPHYLOCOCCUS AUREUS  Final      Susceptibility   Staphylococcus aureus - MIC*    CIPROFLOXACIN <=0.5 SENSITIVE Sensitive     ERYTHROMYCIN <=0.25 SENSITIVE Sensitive     GENTAMICIN <=0.5 SENSITIVE Sensitive     OXACILLIN <=0.25 SENSITIVE Sensitive     TETRACYCLINE <=1 SENSITIVE Sensitive     VANCOMYCIN <=0.5 SENSITIVE Sensitive     TRIMETH/SULFA <=10 SENSITIVE Sensitive     CLINDAMYCIN <=0.25 SENSITIVE Sensitive     RIFAMPIN <=0.5 SENSITIVE Sensitive     Inducible Clindamycin NEGATIVE Sensitive     * STAPHYLOCOCCUS AUREUS  Blood Culture ID Panel (Reflexed)     Status: Abnormal   Collection Time: 11/30/17  2:51 PM  Result Value Ref Range Status    Enterococcus species NOT DETECTED NOT DETECTED Final   Listeria monocytogenes NOT DETECTED NOT DETECTED Final   Staphylococcus species DETECTED (A) NOT DETECTED Final    Comment: CRITICAL RESULT CALLED TO, READ BACK BY AND VERIFIED WITH: K PERKINS PHARMD 12/01/17 2006 JDW    Staphylococcus aureus DETECTED (A) NOT DETECTED Final    Comment: Methicillin (oxacillin) susceptible Staphylococcus aureus (MSSA). Preferred therapy is anti staphylococcal beta lactam antibiotic (Cefazolin or Nafcillin), unless clinically contraindicated. CRITICAL RESULT CALLED TO, READ BACK BY AND VERIFIED WITH: K PERKINS PHARMD 12/01/17 2006 JDW    Methicillin resistance NOT DETECTED NOT DETECTED Final   Streptococcus species NOT DETECTED NOT DETECTED Final   Streptococcus agalactiae NOT DETECTED NOT DETECTED Final   Streptococcus pneumoniae NOT DETECTED NOT DETECTED Final   Streptococcus pyogenes NOT DETECTED NOT DETECTED Final   Acinetobacter baumannii NOT DETECTED NOT DETECTED Final   Enterobacteriaceae species NOT DETECTED NOT DETECTED Final   Enterobacter cloacae complex NOT DETECTED NOT DETECTED Final   Escherichia coli NOT DETECTED NOT DETECTED Final   Klebsiella oxytoca NOT DETECTED NOT DETECTED Final   Klebsiella pneumoniae NOT DETECTED NOT DETECTED Final   Proteus species NOT DETECTED NOT DETECTED Final   Serratia marcescens NOT DETECTED NOT DETECTED Final   Haemophilus influenzae NOT DETECTED NOT DETECTED Final   Neisseria meningitidis NOT DETECTED NOT DETECTED Final   Pseudomonas aeruginosa NOT DETECTED NOT DETECTED Final   Candida albicans NOT DETECTED NOT DETECTED Final   Candida glabrata NOT DETECTED NOT DETECTED Final   Candida krusei NOT DETECTED NOT DETECTED Final   Candida parapsilosis NOT DETECTED NOT DETECTED Final   Candida tropicalis NOT DETECTED NOT DETECTED Final    Comment: Performed at [facility], [place] [address] [place],  [address]  Body fluid culture     Status: None     Collection Time: 12/01/17 11:53 AM  Result Value Ref Range Status   Specimen Description SYNOVIAL LEFT WRIST  Final   Special Requests NONE  Final   Gram Stain   Final    RARE WBC PRESENT,BOTH PMN AND MONONUCLEAR NO ORGANISMS SEEN    Culture   Final    NO GROWTH 3 DAYS Performed at [facility], [place] [address]  [place], [place] [address]    Report Status 12/04/2017 FINAL  Final  Culture, blood (routine x 2)     Status: None (Preliminary result)   Collection Time: 12/02/17  8:57 AM  Result Value Ref Range Status   Specimen Description BLOOD RIGHT HAND  Final   Special Requests   Final    BOTTLES DRAWN AEROBIC AND ANAEROBIC Blood Culture adequate volume   Culture   Final    NO GROWTH 3 DAYS Performed at [facility], [place] [address], [place], [place] [address]    Report Status PENDING  Incomplete  Culture, blood (routine x 2)     Status: None (Preliminary result)   Collection Time: 12/02/17  9:08 AM  Result Value Ref Range Status   Specimen Description BLOOD RIGHT ARM  Final   Special Requests   Final    BOTTLES DRAWN AEROBIC AND ANAEROBIC Blood Culture adequate volume   Culture   Final    NO GROWTH 3 DAYS Performed at [facility], [address], [place], [place] [address]    Report Status PENDING  Incomplete  MRSA PCR Screening     Status: None   Collection Time: 12/04/17  6:12 AM  Result Value Ref Range Status   MRSA by PCR NEGATIVE NEGATIVE Final    Comment:        The GeneXpert MRSA Assay (FDA approved for NASAL specimens only), is one component of a comprehensive MRSA colonization surveillance program. It is not intended to diagnose MRSA infection nor to guide or monitor treatment for MRSA infections. Performed at [facility], [place] [address], [place],  [address]       Studies: No results found.  Scheduled Meds: . acetaminophen  650 mg Oral Q6H  . amLODipine  10 mg Oral Q breakfast  . aspirin EC  81 mg Oral Daily  .  heparin  5,000 Units Subcutaneous Q8H  . insulin aspart  0-15 Units Subcutaneous TID WC  . insulin aspart  0-5 Units Subcutaneous QHS  . insulin NPH Human  40 Units Subcutaneous Q breakfast  . meloxicam  15 mg Oral Q breakfast  . omega-3 acid ethyl esters  1 g Oral Q breakfast  . pantoprazole  40 mg Oral Daily  . tamoxifen  20 mg Oral Daily    Continuous Infusions: .  ceFAZolin (ANCEF) IV 2 g (12/06/17 1334)     LOS: 6 days     Desiree Hane, MD [facility] Pager [phone]  If 7PM-7AM, please contact night-coverage [email or web address] Password [facility] 12/06/2017, 4:27 PM

## 2017-12-07 ENCOUNTER — Inpatient Hospital Stay: Payer: Self-pay

## 2017-12-07 LAB — CULTURE, BLOOD (ROUTINE X 2)
CULTURE: NO GROWTH
CULTURE: NO GROWTH
SPECIAL REQUESTS: ADEQUATE
Special Requests: ADEQUATE

## 2017-12-07 LAB — GLUCOSE, CAPILLARY
GLUCOSE-CAPILLARY: 96 mg/dL (ref 70–99)
GLUCOSE-CAPILLARY: 115 mg/dL — AB (ref 70–99)
Glucose-Capillary: 102 mg/dL — ABNORMAL HIGH (ref 70–99)
Glucose-Capillary: 127 mg/dL — ABNORMAL HIGH (ref 70–99)

## 2017-12-07 MED ORDER — SODIUM CHLORIDE 0.9 % IV SOLN
INTRAVENOUS | Status: DC
  Administered 2017-12-07: 21:00:00 via INTRAVENOUS

## 2017-12-07 MED ORDER — HYDROCHLOROTHIAZIDE 25 MG PO TABS
25.0000 mg | ORAL_TABLET | Freq: Every day | ORAL | Status: DC
  Administered 2017-12-07 – 2017-12-08 (×2): 25 mg via ORAL
  Filled 2017-12-07 (×2): qty 1

## 2017-12-07 NOTE — H&P (View-Only) (Signed)
Patient ID: Tami Schmidt, female   DOB: 23-Apr-1932, 84 y.o.   MRN: [id number]         [facility]  Date of Admission:  11/30/2017   Total days of antibiotics 8        Day 6 cefazolin         ASSESSMENT: She has MSSA bacteremia complicated by probable left wrist and hand infection.  She is improving with antibiotic therapy and does not appear to need surgery.  Repeat blood cultures on 12/02/2017 are negative so she can have a PICC placed in now.  She is scheduled for transesophageal echocardiogram tomorrow.  This will help determine optimal duration of IV cefazolin therapy.  PLAN: 1. Continue cefazolin 2. PICC placement 3. 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 acetaminophen  650 mg Oral Q6H  . amLODipine  10 mg Oral Q breakfast  . aspirin EC  81 mg Oral Daily  . heparin  5,000 Units Subcutaneous Q8H  . hydrochlorothiazide  25 mg Oral Daily  . insulin aspart  0-15 Units Subcutaneous TID WC  . insulin aspart  0-5 Units Subcutaneous QHS  . insulin NPH Human  40 Units Subcutaneous Q breakfast  . meloxicam  15 mg Oral Q breakfast  . omega-3 acid ethyl esters  1 g Oral Q breakfast  . pantoprazole  40 mg Oral Daily  . tamoxifen  20 mg Oral Daily   Continuous Infusions: .  ceFAZolin (ANCEF) IV 2 g (12/07/17 0443)   PRN Meds:.docusate sodium, iopamidol, iopamidol, polyethylene glycol   SUBJECTIVE: She is feeling much better today.  The pain and swelling in her left hand continues to improve.  Review of Systems: Review of Systems  Constitutional: Negative for chills, diaphoresis and fever.  Gastrointestinal: Negative for  abdominal pain, diarrhea, nausea and vomiting.  Musculoskeletal: Positive for joint pain.    Allergies  Allergen Reactions  . Bactrim Swelling    SWELLING OF MOUTH/FACE.  Mar[place] Kitchen Lisinopril Swelling    SWELLING OF MOUTH/FACE.  Marland Kitchen Vasotec Swelling    SWELLING OF MOUTH/FACE.    OBJECTIVE: Vitals:   12/06/17 0552 12/06/17 1422 12/06/17 2123 12/07/17 0401  BP: (!) 150/71 130/68 (!) 165/77 (!) 160/76  Pulse: 75 84 77 75  Resp: 16 16    Temp: 98.5 F (36.9 C) 98.1 F (36.7 C) 98.5 F (36.9 C) 98.9 F (37.2 C)  TempSrc: Oral Oral Oral Oral  SpO2: 100% 97% 99% 100%  Weight:      Height:       Body mass index is 33.88 kg/m.  Physical Exam  Constitutional: She is oriented to person, place, and time.  She is sitting up in a chair eating lunch.  She is in good spirits.  Musculoskeletal:  The swelling of her left hand has decreased and she has much greater movement with less pain.  Neurological: She is alert and oriented to person, place, and time.  Skin: No rash noted.  Psychiatric: She has a normal mood and affect.    Lab Results Lab Results  Component Value Date   WBC 14.4 (H) 12/01/2017   HGB 10.8 (L) 12/01/2017  HCT 34.2 (L) 12/01/2017   MCV 77.2 (L) 12/01/2017   PLT 294 12/01/2017    Lab Results  Component Value Date   CREATININE 0.68 12/01/2017   BUN 11 12/01/2017   NA 137 12/01/2017   K 3.1 (L) 12/01/2017   CL 104 12/01/2017   CO2 24 12/01/2017    Lab Results  Component Value Date   ALT 14 12/01/2017   AST 18 12/01/2017   ALKPHOS 51 12/01/2017   BILITOT 0.7 12/01/2017     Microbiology: Recent Results (from the past 240 hour(s))  Blood culture (routine x 2)     Status: None   Collection Time: 11/30/17  2:33 PM  Result Value Ref Range Status   Specimen Description BLOOD RIGHT HAND  Final   Special Requests   Final    BOTTLES DRAWN AEROBIC AND ANAEROBIC Blood Culture adequate volume   Culture   Final    NO GROWTH 5 DAYS Performed at [place], [place] [address], [place], [place] [address]    Report Status 12/05/2017 FINAL  Final  Blood culture (routine x 2)     Status: Abnormal   Collection Time: 11/30/17  2:51 PM  Result Value Ref Range Status   Specimen Description BLOOD RIGHT WRIST  Final   Special Requests   Final    BOTTLES DRAWN AEROBIC AND ANAEROBIC Blood Culture results may not be optimal due to an inadequate volume of blood received in culture bottles   Culture  Setup Time   Final    AEROBIC BOTTLE ONLY GRAM POSITIVE COCCI CRITICAL RESULT CALLED TO, READ BACK BY AND VERIFIED WITH: Mathews Robinsons [facility] 12/01/17 2006 JDW Performed at [facility], [place] [address], [place], [place] [address]    Culture STAPHYLOCOCCUS AUREUS (A)  Final   Report Status 12/03/2017 FINAL  Final   Organism ID, Bacteria STAPHYLOCOCCUS AUREUS  Final      Susceptibility   Staphylococcus aureus - MIC*    CIPROFLOXACIN <=0.5 SENSITIVE Sensitive     ERYTHROMYCIN <=0.25 SENSITIVE Sensitive     GENTAMICIN <=0.5 SENSITIVE Sensitive     OXACILLIN <=0.25 SENSITIVE Sensitive     TETRACYCLINE <=1 SENSITIVE Sensitive     VANCOMYCIN <=0.5 SENSITIVE Sensitive     TRIMETH/SULFA <=10 SENSITIVE Sensitive     CLINDAMYCIN <=0.25 SENSITIVE Sensitive     RIFAMPIN <=0.5 SENSITIVE Sensitive     Inducible Clindamycin NEGATIVE Sensitive     * STAPHYLOCOCCUS AUREUS  Blood Culture ID Panel (Reflexed)     Status: Abnormal   Collection Time: 11/30/17  2:51 PM  Result Value Ref Range Status   Enterococcus species NOT DETECTED NOT DETECTED Final   Listeria monocytogenes NOT DETECTED NOT DETECTED Final   Staphylococcus species DETECTED (A) NOT DETECTED Final    Comment: CRITICAL RESULT CALLED TO, READ BACK BY AND VERIFIED WITH: K PERKINS PHARMD 12/01/17 2006 JDW    Staphylococcus aureus DETECTED (A) NOT DETECTED Final    Comment: Methicillin (oxacillin) susceptible Staphylococcus aureus (MSSA). Preferred therapy is anti staphylococcal beta lactam antibiotic (Cefazolin  or Nafcillin), unless clinically contraindicated. CRITICAL RESULT CALLED TO, READ BACK BY AND VERIFIED WITH: K PERKINS PHARMD 12/01/17 2006 JDW    Methicillin resistance NOT DETECTED NOT DETECTED Final   Streptococcus species NOT DETECTED NOT DETECTED Final   Streptococcus agalactiae NOT DETECTED NOT DETECTED Final   Streptococcus pneumoniae NOT DETECTED NOT DETECTED Final   Streptococcus pyogenes NOT DETECTED NOT DETECTED Final   Acinetobacter baumannii NOT  DETECTED NOT DETECTED Final   Enterobacteriaceae species NOT DETECTED NOT DETECTED Final   Enterobacter cloacae complex NOT DETECTED NOT DETECTED Final   Escherichia coli NOT DETECTED NOT DETECTED Final   Klebsiella oxytoca NOT DETECTED NOT DETECTED Final   Klebsiella pneumoniae NOT DETECTED NOT DETECTED Final   Proteus species NOT DETECTED NOT DETECTED Final   Serratia marcescens NOT DETECTED NOT DETECTED Final   Haemophilus influenzae NOT DETECTED NOT DETECTED Final   Neisseria meningitidis NOT DETECTED NOT DETECTED Final   Pseudomonas aeruginosa NOT DETECTED NOT DETECTED Final   Candida albicans NOT DETECTED NOT DETECTED Final   Candida glabrata NOT DETECTED NOT DETECTED Final   Candida krusei NOT DETECTED NOT DETECTED Final   Candida parapsilosis NOT DETECTED NOT DETECTED Final   Candida tropicalis NOT DETECTED NOT DETECTED Final    Comment: Performed at [facility], [place] [address], [place], [place] [address]  Body fluid culture     Status: None   Collection Time: 12/01/17 11:53 AM  Result Value Ref Range Status   Specimen Description SYNOVIAL LEFT WRIST  Final   Special Requests NONE  Final   Gram Stain   Final    RARE WBC PRESENT,BOTH PMN AND MONONUCLEAR NO ORGANISMS SEEN    Culture   Final    NO GROWTH 3 DAYS Performed at [facility], [place] [address] [place], [place] [address]    Report Status 12/04/2017 FINAL  Final  Culture, blood (routine x 2)     Status: None   Collection Time: 12/02/17  8:57 AM    Result Value Ref Range Status   Specimen Description BLOOD RIGHT HAND  Final   Special Requests   Final    BOTTLES DRAWN AEROBIC AND ANAEROBIC Blood Culture adequate volume   Culture   Final    NO GROWTH 5 DAYS Performed at [facility], [place] [address] [place], [place] [address]    Report Status 12/07/2017 FINAL  Final  Culture, blood (routine x 2)     Status: None   Collection Time: 12/02/17  9:08 AM  Result Value Ref Range Status   Specimen Description BLOOD RIGHT ARM  Final   Special Requests   Final    BOTTLES DRAWN AEROBIC AND ANAEROBIC Blood Culture adequate volume   Culture   Final    NO GROWTH 5 DAYS Performed at [facility], [place] [address] [place], [place] [address]    Report Status 12/07/2017 FINAL  Final  MRSA PCR Screening     Status: None   Collection Time: 12/04/17  6:12 AM  Result Value Ref Range Status   MRSA by PCR NEGATIVE NEGATIVE Final    Comment:        The GeneXpert MRSA Assay (FDA approved for NASAL specimens only), is one component of a comprehensive MRSA colonization surveillance program. It is not intended to diagnose MRSA infection nor to guide or monitor treatment for MRSA infections. Performed at [facility], [place] [address] [place], [place] [address]     Michel Bickers, [facility] [phone] pager   [phone] cell 12/07/2017, 12:58 PM

## 2017-12-07 NOTE — Progress Notes (Signed)
PROGRESS NOTE  Tami Schmidt QQI:[id number] DOB: 15-Jan-1933 DOA: 11/30/2017 PCP: Donald Prose, MD  HPI/brief narrative  Tami Schmidt is a 84 y.o. year old female with medical history significant for breast cancer status post bilateral mastectomy recurrent lymphedema, type 2 diabetes, HTN, and HLD who presented on 11/30/2017 with left hand swelling and pain for the past week and was found to have extensive left wrist tenosynovitis concerning for likely infectious etiology.Extensive tenosynovitis on MRI of left wrist ultrasound-guided aspiration of flexor tendons and carpus on 8/20 (the wrist joint was not aspirated).  Blood cultures from 8/19 growing MSSA.  Empirically started on vancomycin cefepime on day of admission.  Switched to cefazolin to cover MSSA bacteria on 8/21.  Subjective Minimal wrist pain  Assessment/Plan:  1. Extensive left wrist tenosynovitis, suspect infectious etiology with  MSSA bacteremia, improve.  Significantly less swelling, improvement in pain.  Repeat blood cultures on 8/21 remain negative growth to date.  Plan for TEE on 8/27 to rule out endocarditis ( no stigmata on exam)if unremarkable can obtain PICC line to continue cefazolin.  Surgery and ID following  2. Type 2 diabetes, A1c 6.6% (12/2016), controlled.  Holding home metformin.  Continue home NPH 40 units with breakfast, monitor CBG  3. Hypertension, not at goal. SBP in 160s.  Continue home amlodipine.  Previously holding home losartan/HCTZ while on NSAIDs. Will resume HCTZ on 8/26  Code Status: Full code  Family Communication: no family at bedside  Disposition Plan: needs TEE, NPO midnight for 8/27, will need PICC placed once cultures remain negative   Consultants:  Orthopedics, infectious disease  Procedures:  8/20 ultrasound guided aspiration of flexor tendons and carpus (left)  12/03/2017 TTE   Antimicrobials: Anti-infectives (From admission, onward)   Start     Dose/Rate Route Frequency  Ordered Stop   12/02/17 1400  ceFAZolin (ANCEF) IVPB 2g/100 mL premix     2 g 200 mL/hr over 30 Minutes Intravenous Every 8 hours 12/02/17 0853     11/30/17 2300  ceFEPIme (MAXIPIME) 1 g in sodium chloride 0.9 % 100 mL IVPB  Status:  Discontinued     1 g 200 mL/hr over 30 Minutes Intravenous Every 8 hours 11/30/17 2242 12/02/17 0824   11/30/17 2300  vancomycin (VANCOCIN) IVPB 750 mg/150 ml premix  Status:  Discontinued     750 mg 150 mL/hr over 60 Minutes Intravenous 2 times daily 11/30/17 2242 11/30/17 2243   11/30/17 1430  ceFEPIme (MAXIPIME) 1 g in sodium chloride 0.9 % 100 mL IVPB     1 g 200 mL/hr over 30 Minutes Intravenous  Once 11/30/17 1424 11/30/17 1629   11/30/17 1430  vancomycin (VANCOCIN) 1,500 mg in sodium chloride 0.9 % 500 mL IVPB  Status:  Discontinued     1,500 mg 250 mL/hr over 120 Minutes Intravenous Every 24 hours 11/30/17 1427 12/02/17 0824        Cultures:  8/19, 1 of 2 blood cultures growing MSSA  8/21 pending blood cultures  Telemetry: No  DVT prophylaxis: Heparin   Objective: Vitals:   12/06/17 0552 12/06/17 1422 12/06/17 2123 12/07/17 0401  BP: (!) 150/71 130/68 (!) 165/77 (!) 160/76  Pulse: 75 84 77 75  Resp: 16 16    Temp: 98.5 F (36.9 C) 98.1 F (36.7 C) 98.5 F (36.9 C) 98.9 F (37.2 C)  TempSrc: Oral Oral Oral Oral  SpO2: 100% 97% 99% 100%  Weight:      Height:  Intake/Output Summary (Last 24 hours) at 12/07/2017 1146 Last data filed at 12/06/2017 1645 Gross per 24 hour  Intake 350.35 ml  Output -  Net 350.35 ml   Filed Weights   11/30/17 1407  Weight: 95.2 kg    Exam:  Constitutional:normal appearing female Eyes: EOMI, anicteric, normal conjunctivae ENMT: Oropharynx with moist mucous membranes, normal dentition Cardiovascular: RRR no MRGs, with no peripheral edema Respiratory: Normal respiratory effort, clear breath sounds  Skin: Noticeably less wrist swelling, in ace bandage.   Neurologic: Grossly no focal  neuro deficit. Psychiatric:Appropriate affect, and mood. Mental status AAOx3  Data Reviewed: CBC: Recent Labs  Lab 11/30/17 1212 12/01/17 0756  WBC 14.0* 14.4*  HGB 11.7* 10.8*  HCT 38.1 34.2*  MCV 79.0 77.2*  PLT 331 956   Basic Metabolic Panel: Recent Labs  Lab 11/30/17 1212 12/01/17 0756  NA 135 137  K 3.8 3.1*  CL 99 104  CO2 22 24  GLUCOSE 150* 95  BUN 10 11  CREATININE 0.74 0.68  CALCIUM 8.7* 7.8*   GFR: Estimated Creatinine Clearance: 62 mL/min (by C-G formula based on SCr of 0.68 mg/dL). Liver Function Tests: Recent Labs  Lab 12/01/17 0756  AST 18  ALT 14  ALKPHOS 51  BILITOT 0.7  PROT 6.0*  ALBUMIN 2.5*   No results for input(s): LIPASE, AMYLASE in the last 168 hours. No results for input(s): AMMONIA in the last 168 hours. Coagulation Profile: No results for input(s): INR, PROTIME in the last 168 hours. Cardiac Enzymes: No results for input(s): CKTOTAL, CKMB, CKMBINDEX, TROPONINI in the last 168 hours. BNP (last 3 results) No results for input(s): PROBNP in the last 8760 hours. HbA1C: No results for input(s): HGBA1C in the last 72 hours. CBG: Recent Labs  Lab 12/06/17 1130 12/06/17 1639 12/06/17 2120 12/07/17 0812 12/07/17 1135  GLUCAP 105* 131* 135* 102* 127*   Lipid Profile: No results for input(s): CHOL, HDL, LDLCALC, TRIG, CHOLHDL, LDLDIRECT in the last 72 hours. Thyroid Function Tests: No results for input(s): TSH, T4TOTAL, FREET4, T3FREE, THYROIDAB in the last 72 hours. Anemia Panel: No results for input(s): VITAMINB12, FOLATE, FERRITIN, TIBC, IRON, RETICCTPCT in the last 72 hours. Urine analysis:    Component Value Date/Time   COLORURINE YELLOW 12/02/2017 0615   APPEARANCEUR HAZY (A) 12/02/2017 0615   LABSPEC 1.017 12/02/2017 0615   PHURINE 6.0 12/02/2017 0615   GLUCOSEU NEGATIVE 12/02/2017 0615   HGBUR SMALL (A) 12/02/2017 0615   BILIRUBINUR NEGATIVE 12/02/2017 0615   KETONESUR NEGATIVE 12/02/2017 0615   PROTEINUR  NEGATIVE 12/02/2017 0615   UROBILINOGEN 0.2 04/26/2010 0840   NITRITE NEGATIVE 12/02/2017 0615   LEUKOCYTESUR NEGATIVE 12/02/2017 0615   Sepsis Labs: @LABRCNTIP (procalcitonin:4,lacticidven:4)  ) Recent Results (from the past 240 hour(s))  Blood culture (routine x 2)     Status: None   Collection Time: 11/30/17  2:33 PM  Result Value Ref Range Status   Specimen Description BLOOD RIGHT HAND  Final   Special Requests   Final    BOTTLES DRAWN AEROBIC AND ANAEROBIC Blood Culture adequate volume   Culture   Final    NO GROWTH 5 DAYS Performed at [facility], [place] [address], [place],  [address]    Report Status 12/05/2017 FINAL  Final  Blood culture (routine x 2)     Status: Abnormal   Collection Time: 11/30/17  2:51 PM  Result Value Ref Range Status   Specimen Description BLOOD RIGHT WRIST  Final   Special Requests  Final    BOTTLES DRAWN AEROBIC AND ANAEROBIC Blood Culture results may not be optimal due to an inadequate volume of blood received in culture bottles   Culture  Setup Time   Final    AEROBIC BOTTLE ONLY GRAM POSITIVE COCCI CRITICAL RESULT CALLED TO, READ BACK BY AND VERIFIED WITH: Mathews Robinsons [facility] 12/01/17 2006 JDW Performed at [facility], [place] [address] [place], [place] [address]    Culture STAPHYLOCOCCUS AUREUS (A)  Final   Report Status 12/03/2017 FINAL  Final   Organism ID, Bacteria STAPHYLOCOCCUS AUREUS  Final      Susceptibility   Staphylococcus aureus - MIC*    CIPROFLOXACIN <=0.5 SENSITIVE Sensitive     ERYTHROMYCIN <=0.25 SENSITIVE Sensitive     GENTAMICIN <=0.5 SENSITIVE Sensitive     OXACILLIN <=0.25 SENSITIVE Sensitive     TETRACYCLINE <=1 SENSITIVE Sensitive     VANCOMYCIN <=0.5 SENSITIVE Sensitive     TRIMETH/SULFA <=10 SENSITIVE Sensitive     CLINDAMYCIN <=0.25 SENSITIVE Sensitive     RIFAMPIN <=0.5 SENSITIVE Sensitive     Inducible Clindamycin NEGATIVE Sensitive     * STAPHYLOCOCCUS AUREUS  Blood Culture ID Panel (Reflexed)      Status: Abnormal   Collection Time: 11/30/17  2:51 PM  Result Value Ref Range Status   Enterococcus species NOT DETECTED NOT DETECTED Final   Listeria monocytogenes NOT DETECTED NOT DETECTED Final   Staphylococcus species DETECTED (A) NOT DETECTED Final    Comment: CRITICAL RESULT CALLED TO, READ BACK BY AND VERIFIED WITH: K PERKINS PHARMD 12/01/17 2006 JDW    Staphylococcus aureus DETECTED (A) NOT DETECTED Final    Comment: Methicillin (oxacillin) susceptible Staphylococcus aureus (MSSA). Preferred therapy is anti staphylococcal beta lactam antibiotic (Cefazolin or Nafcillin), unless clinically contraindicated. CRITICAL RESULT CALLED TO, READ BACK BY AND VERIFIED WITH: K PERKINS PHARMD 12/01/17 2006 JDW    Methicillin resistance NOT DETECTED NOT DETECTED Final   Streptococcus species NOT DETECTED NOT DETECTED Final   Streptococcus agalactiae NOT DETECTED NOT DETECTED Final   Streptococcus pneumoniae NOT DETECTED NOT DETECTED Final   Streptococcus pyogenes NOT DETECTED NOT DETECTED Final   Acinetobacter baumannii NOT DETECTED NOT DETECTED Final   Enterobacteriaceae species NOT DETECTED NOT DETECTED Final   Enterobacter cloacae complex NOT DETECTED NOT DETECTED Final   Escherichia coli NOT DETECTED NOT DETECTED Final   Klebsiella oxytoca NOT DETECTED NOT DETECTED Final   Klebsiella pneumoniae NOT DETECTED NOT DETECTED Final   Proteus species NOT DETECTED NOT DETECTED Final   Serratia marcescens NOT DETECTED NOT DETECTED Final   Haemophilus influenzae NOT DETECTED NOT DETECTED Final   Neisseria meningitidis NOT DETECTED NOT DETECTED Final   Pseudomonas aeruginosa NOT DETECTED NOT DETECTED Final   Candida albicans NOT DETECTED NOT DETECTED Final   Candida glabrata NOT DETECTED NOT DETECTED Final   Candida krusei NOT DETECTED NOT DETECTED Final   Candida parapsilosis NOT DETECTED NOT DETECTED Final   Candida tropicalis NOT DETECTED NOT DETECTED Final    Comment: Performed at [facility], [place] [address] [place], [place] [address]  Body fluid culture     Status: None   Collection Time: 12/01/17 11:53 AM  Result Value Ref Range Status   Specimen Description SYNOVIAL LEFT WRIST  Final   Special Requests NONE  Final   Gram Stain   Final    RARE WBC PRESENT,BOTH PMN AND MONONUCLEAR NO ORGANISMS SEEN    Culture   Final    NO  GROWTH 3 DAYS Performed at [facility], [place] [address], [place], [place] [address]    Report Status 12/04/2017 FINAL  Final  Culture, blood (routine x 2)     Status: None   Collection Time: 12/02/17  8:57 AM  Result Value Ref Range Status   Specimen Description BLOOD RIGHT HAND  Final   Special Requests   Final    BOTTLES DRAWN AEROBIC AND ANAEROBIC Blood Culture adequate volume   Culture   Final    NO GROWTH 5 DAYS Performed at [facility], [place] [address], [place], [place] [address]    Report Status 12/07/2017 FINAL  Final  Culture, blood (routine x 2)     Status: None   Collection Time: 12/02/17  9:08 AM  Result Value Ref Range Status   Specimen Description BLOOD RIGHT ARM  Final   Special Requests   Final    BOTTLES DRAWN AEROBIC AND ANAEROBIC Blood Culture adequate volume   Culture   Final    NO GROWTH 5 DAYS Performed at [facility], [place] [address], [place], [place] [address]    Report Status 12/07/2017 FINAL  Final  MRSA PCR Screening     Status: None   Collection Time: 12/04/17  6:12 AM  Result Value Ref Range Status   MRSA by PCR NEGATIVE NEGATIVE Final    Comment:        The GeneXpert MRSA Assay (FDA approved for NASAL specimens only), is one component of a comprehensive MRSA colonization surveillance program. It is not intended to diagnose MRSA infection nor to guide or monitor treatment for MRSA infections. Performed at [facility], [place] [address] [place], [place] [address]       Studies: No results found.  Scheduled Meds: . acetaminophen  650 mg Oral Q6H  . amLODipine   10 mg Oral Q breakfast  . aspirin EC  81 mg Oral Daily  . heparin  5,000 Units Subcutaneous Q8H  . insulin aspart  0-15 Units Subcutaneous TID WC  . insulin aspart  0-5 Units Subcutaneous QHS  . insulin NPH Human  40 Units Subcutaneous Q breakfast  . meloxicam  15 mg Oral Q breakfast  . omega-3 acid ethyl esters  1 g Oral Q breakfast  . pantoprazole  40 mg Oral Daily  . tamoxifen  20 mg Oral Daily    Continuous Infusions: .  ceFAZolin (ANCEF) IV 2 g (12/07/17 0443)     LOS: 7 days     Desiree Hane, MD [facility] Pager [phone]  If 7PM-7AM, please contact night-coverage [email or web address] Password [facility] 12/07/2017, 11:46 AM

## 2017-12-07 NOTE — Progress Notes (Signed)
Occupational Therapy Treatment Patient Details Name: Tami Schmidt MRN: [id number] DOB: April 11, 1933 Today's Date: 12/07/2017    History of present illness Pt is an 84 y.o. female with medical history significant for breast status post treatment with recurrent lymphedema in the L UE, bilateral mastectomy, history of diabetes, hypertension, and hyperlipidemia. She presented to the ED with pain and swelling on L hand and wrist. Admitted with L wrist tenosynovitis.    OT comments  Pt reporting feeling depressed upon OTs entry, eager to get procedure over with today. Pt performed toileting and standing grooming with min assist. Ambulated with supervision for safety and remained up in chair. Completed L forearm, wrist and digit A/AROM exercises and retrograde massage of digits. Elevated L UE on pillows.  Follow Up Recommendations  Outpatient OT    Equipment Recommendations  None recommended by OT    Recommendations for Other Services      Precautions / Restrictions Precautions Precautions: Fall Precaution Comments: shuffling gait       Mobility Bed Mobility Overal bed mobility: Needs Assistance Bed Mobility: Supine to Sit     Supine to sit: Min assist     General bed mobility comments: Assist to raise trunk, HOB up   Transfers Overall transfer level: Needs assistance Equipment used: None Transfers: Sit to/from Stand Sit to Stand: Supervision;From elevated surface              Balance                                           ADL either performed or assessed with clinical judgement   ADL Overall ADL's : Needs assistance/impaired     Grooming: Oral care;Wash/dry hands;Wash/dry face;Standing;Minimal assistance Grooming Details (indicate cue type and reason): assist to open toothpaste                 Toilet Transfer: Supervision/safety;Ambulation;BSC(over toilet)   Toileting- Clothing Manipulation and Hygiene: Minimal assistance;Sit to/from  stand Toileting - Clothing Manipulation Details (indicate cue type and reason): assist to pull underwear over hips     Functional mobility during ADLs: Supervision/safety General ADL Comments: pt using L UE to turn on water     Vision       Perception     Praxis      Cognition Arousal/Alertness: Awake/alert Behavior During Therapy: WFL for tasks assessed/performed Overall Cognitive Status: Within Functional Limits for tasks assessed                                          Exercises     Shoulder Instructions       General Comments      Pertinent Vitals/ Pain       Pain Assessment: No/denies pain  Home Living                                          Prior Functioning/Environment              Frequency  Min 3X/week        Progress Toward Goals  OT Goals(current goals can now be found in the care plan section)  Progress towards OT goals: Progressing toward  goals  Acute Rehab OT Goals Patient Stated Goal: to get back to using her L hand OT Goal Formulation: With patient Time For Goal Achievement: 12/19/17 Potential to Achieve Goals: Good  Plan Discharge plan remains appropriate    Co-evaluation                 AM-PAC PT "6 Clicks" Daily Activity     Outcome Measure   Help from another person eating meals?: A Little Help from another person taking care of personal grooming?: A Little Help from another person toileting, which includes using toliet, bedpan, or urinal?: A Little Help from another person bathing (including washing, rinsing, drying)?: A Lot Help from another person to put on and taking off regular upper body clothing?: A Little Help from another person to put on and taking off regular lower body clothing?: A Lot 6 Click Score: 16    End of Session Equipment Utilized During Treatment: Gait belt  OT Visit Diagnosis: Muscle weakness (generalized) (M62.81);Other abnormalities of gait and  mobility (R26.89)   Activity Tolerance Patient tolerated treatment well   Patient Left in chair;with call bell/phone within reach   Nurse Communication          Time: 9244-6286 OT Time Calculation (min): 30 min  Charges: OT General Charges $OT Visit: 1 Visit OT Treatments $Self Care/Home Management : 8-22 mins $Therapeutic Exercise: 8-22 mins  12/07/2017 Nestor Lewandowsky, OTR/L Pager: [phone]   Malka So 12/07/2017, 10:06 AM

## 2017-12-07 NOTE — Progress Notes (Signed)
Patient ID: Tami Schmidt, female   DOB: 1932-12-25, 84 y.o.   MRN: [id number]         [facility]  Date of Admission:  11/30/2017   Total days of antibiotics 8        Day 6 cefazolin         ASSESSMENT: She has MSSA bacteremia complicated by probable left wrist and hand infection.  She is improving with antibiotic therapy and does not appear to need surgery.  Repeat blood cultures on 12/02/2017 are negative so she can have a PICC placed in now.  She is scheduled for transesophageal echocardiogram tomorrow.  This will help determine optimal duration of IV cefazolin therapy.  PLAN: 1. Continue cefazolin 2. PICC placement 3. 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 acetaminophen  650 mg Oral Q6H  . amLODipine  10 mg Oral Q breakfast  . aspirin EC  81 mg Oral Daily  . heparin  5,000 Units Subcutaneous Q8H  . hydrochlorothiazide  25 mg Oral Daily  . insulin aspart  0-15 Units Subcutaneous TID WC  . insulin aspart  0-5 Units Subcutaneous QHS  . insulin NPH Human  40 Units Subcutaneous Q breakfast  . meloxicam  15 mg Oral Q breakfast  . omega-3 acid ethyl esters  1 g Oral Q breakfast  . pantoprazole  40 mg Oral Daily  . tamoxifen  20 mg Oral Daily   Continuous Infusions: .  ceFAZolin (ANCEF) IV 2 g (12/07/17 0443)   PRN Meds:.docusate sodium, iopamidol, iopamidol, polyethylene glycol   SUBJECTIVE: She is feeling much better today.  The pain and swelling in her left hand continues to improve.  Review of Systems: Review of Systems  Constitutional: Negative for chills, diaphoresis and fever.  Gastrointestinal: Negative for  abdominal pain, diarrhea, nausea and vomiting.  Musculoskeletal: Positive for joint pain.    Allergies  Allergen Reactions  . Bactrim Swelling    SWELLING OF MOUTH/FACE.  Marland Kitchen Lisinopril Swelling    SWELLING OF MOUTH/FACE.  Marland Kitchen Vasotec Swelling    SWELLING OF MOUTH/FACE.    OBJECTIVE: Vitals:   12/06/17 0552 12/06/17 1422 12/06/17 2123 12/07/17 0401  BP: (!) 150/71 130/68 (!) 165/77 (!) 160/76  Pulse: 75 84 77 75  Resp: 16 16    Temp: 98.5 F (36.9 C) 98.1 F (36.7 C) 98.5 F (36.9 C) 98.9 F (37.2 C)  TempSrc: Oral Oral Oral Oral  SpO2: 100% 97% 99% 100%  Weight:      Height:       Body mass index is 33.88 kg/m.  Physical Exam  Constitutional: She is oriented to person, place, and time.  She is sitting up in a chair eating lunch.  She is in good spirits.  Musculoskeletal:  The swelling of her left hand has decreased and she has much greater movement with less pain.  Neurological: She is alert and oriented to person, place, and time.  Skin: No rash noted.  Psychiatric: She has a normal mood and affect.    Lab Results Lab Results  Component Value Date   WBC 14.4 (H) 12/01/2017   HGB 10.8 (L) 12/01/2017  HCT 34.2 (L) 12/01/2017   MCV 77.2 (L) 12/01/2017   PLT 294 12/01/2017    Lab Results  Component Value Date   CREATININE 0.68 12/01/2017   BUN 11 12/01/2017   NA 137 12/01/2017   K 3.1 (L) 12/01/2017   CL 104 12/01/2017   CO2 24 12/01/2017    Lab Results  Component Value Date   ALT 14 12/01/2017   AST 18 12/01/2017   ALKPHOS 51 12/01/2017   BILITOT 0.7 12/01/2017     Microbiology: Recent Results (from the past 240 hour(s))  Blood culture (routine x 2)     Status: None   Collection Time: 11/30/17  2:33 PM  Result Value Ref Range Status   Specimen Description BLOOD RIGHT HAND  Final   Special Requests   Final    BOTTLES DRAWN AEROBIC AND ANAEROBIC Blood Culture adequate volume   Culture   Final    NO GROWTH 5 DAYS Performed at [place], [place] [address], [place], [place] [address]    Report Status 12/05/2017 FINAL  Final  Blood culture (routine x 2)     Status: Abnormal   Collection Time: 11/30/17  2:51 PM  Result Value Ref Range Status   Specimen Description BLOOD RIGHT WRIST  Final   Special Requests   Final    BOTTLES DRAWN AEROBIC AND ANAEROBIC Blood Culture results may not be optimal due to an inadequate volume of blood received in culture bottles   Culture  Setup Time   Final    AEROBIC BOTTLE ONLY GRAM POSITIVE COCCI CRITICAL RESULT CALLED TO, READ BACK BY AND VERIFIED WITH: Mathews Robinsons [facility] 12/01/17 2006 JDW Performed at [facility], [place] [address], [place],  [address]    Culture STAPHYLOCOCCUS AUREUS (A)  Final   Report Status 12/03/2017 FINAL  Final   Organism ID, Bacteria STAPHYLOCOCCUS AUREUS  Final      Susceptibility   Staphylococcus aureus - MIC*    CIPROFLOXACIN <=0.5 SENSITIVE Sensitive     ERYTHROMYCIN <=0.25 SENSITIVE Sensitive     GENTAMICIN <=0.5 SENSITIVE Sensitive     OXACILLIN <=0.25 SENSITIVE Sensitive     TETRACYCLINE <=1 SENSITIVE Sensitive     VANCOMYCIN <=0.5 SENSITIVE Sensitive     TRIMETH/SULFA <=10 SENSITIVE Sensitive     CLINDAMYCIN <=0.25 SENSITIVE Sensitive     RIFAMPIN <=0.5 SENSITIVE Sensitive     Inducible Clindamycin NEGATIVE Sensitive     * STAPHYLOCOCCUS AUREUS  Blood Culture ID Panel (Reflexed)     Status: Abnormal   Collection Time: 11/30/17  2:51 PM  Result Value Ref Range Status   Enterococcus species NOT DETECTED NOT DETECTED Final   Listeria monocytogenes NOT DETECTED NOT DETECTED Final   Staphylococcus species DETECTED (A) NOT DETECTED Final    Comment: CRITICAL RESULT CALLED TO, READ BACK BY AND VERIFIED WITH: K PERKINS PHARMD 12/01/17 2006 JDW    Staphylococcus aureus DETECTED (A) NOT DETECTED Final    Comment: Methicillin (oxacillin) susceptible Staphylococcus aureus (MSSA). Preferred therapy is anti staphylococcal beta lactam antibiotic (Cefazolin  or Nafcillin), unless clinically contraindicated. CRITICAL RESULT CALLED TO, READ BACK BY AND VERIFIED WITH: K PERKINS PHARMD 12/01/17 2006 JDW    Methicillin resistance NOT DETECTED NOT DETECTED Final   Streptococcus species NOT DETECTED NOT DETECTED Final   Streptococcus agalactiae NOT DETECTED NOT DETECTED Final   Streptococcus pneumoniae NOT DETECTED NOT DETECTED Final   Streptococcus pyogenes NOT DETECTED NOT DETECTED Final   Acinetobacter baumannii NOT  DETECTED NOT DETECTED Final   Enterobacteriaceae species NOT DETECTED NOT DETECTED Final   Enterobacter cloacae complex NOT DETECTED NOT DETECTED Final   Escherichia coli NOT DETECTED NOT DETECTED Final   Klebsiella oxytoca NOT DETECTED NOT DETECTED Final   Klebsiella pneumoniae NOT DETECTED NOT DETECTED Final   Proteus species NOT DETECTED NOT DETECTED Final   Serratia marcescens NOT DETECTED NOT DETECTED Final   Haemophilus influenzae NOT DETECTED NOT DETECTED Final   Neisseria meningitidis NOT DETECTED NOT DETECTED Final   Pseudomonas aeruginosa NOT DETECTED NOT DETECTED Final   Candida albicans NOT DETECTED NOT DETECTED Final   Candida glabrata NOT DETECTED NOT DETECTED Final   Candida krusei NOT DETECTED NOT DETECTED Final   Candida parapsilosis NOT DETECTED NOT DETECTED Final   Candida tropicalis NOT DETECTED NOT DETECTED Final    Comment: Performed at [facility], [place] [address] [place], [place] [address]  Body fluid culture     Status: None   Collection Time: 12/01/17 11:53 AM  Result Value Ref Range Status   Specimen Description SYNOVIAL LEFT WRIST  Final   Special Requests NONE  Final   Gram Stain   Final    RARE WBC PRESENT,BOTH PMN AND MONONUCLEAR NO ORGANISMS SEEN    Culture   Final    NO GROWTH 3 DAYS Performed at [facility], [place] [address], [place], [place] [address]    Report Status 12/04/2017 FINAL  Final  Culture, blood (routine x 2)     Status: None   Collection Time: 12/02/17  8:57 AM    Result Value Ref Range Status   Specimen Description BLOOD RIGHT HAND  Final   Special Requests   Final    BOTTLES DRAWN AEROBIC AND ANAEROBIC Blood Culture adequate volume   Culture   Final    NO GROWTH 5 DAYS Performed at [facility], [place] [address] [place], [place] [address]    Report Status 12/07/2017 FINAL  Final  Culture, blood (routine x 2)     Status: None   Collection Time: 12/02/17  9:08 AM  Result Value Ref Range Status   Specimen Description BLOOD RIGHT ARM  Final   Special Requests   Final    BOTTLES DRAWN AEROBIC AND ANAEROBIC Blood Culture adequate volume   Culture   Final    NO GROWTH 5 DAYS Performed at [facility], [place] [address], [place], [place] [address]    Report Status 12/07/2017 FINAL  Final  MRSA PCR Screening     Status: None   Collection Time: 12/04/17  6:12 AM  Result Value Ref Range Status   MRSA by PCR NEGATIVE NEGATIVE Final    Comment:        The GeneXpert MRSA Assay (FDA approved for NASAL specimens only), is one component of a comprehensive MRSA colonization surveillance program. It is not intended to diagnose MRSA infection nor to guide or monitor treatment for MRSA infections. Performed at [facility], [place] [address] [place], [place] [address]     Michel Bickers, [place] [facility] [place] [facility] [phone] pager   [phone] cell 12/07/2017, 12:58 PM

## 2017-12-07 NOTE — Progress Notes (Signed)
    [facility] has been requested to perform a transesophageal echocardiogram on [name] for Bacteremia.  After careful review of history and examination, the risks and benefits of transesophageal echocardiogram have been explained including risks of esophageal damage, perforation (1:10,000 risk), bleeding, pharyngeal hematoma as well as other potential complications associated with conscious sedation including aspiration, arrhythmia, respiratory failure and death. Alternatives to treatment were discussed, questions were answered. Patient is willing to proceed.  TEE - Dr. Stanford Breed 12/08/17 @ 1500. NPO after midnight. Meds with sips.   Tami Kail, PA-C 12/07/2017 4:32 PM

## 2017-12-07 NOTE — Plan of Care (Signed)

## 2017-12-08 ENCOUNTER — Inpatient Hospital Stay (HOSPITAL_COMMUNITY): Payer: Medicare Other

## 2017-12-08 ENCOUNTER — Encounter (HOSPITAL_COMMUNITY)
Admission: EM | Disposition: A | Discharge status: Home Health | Payer: Self-pay | Source: Home / Self Care | Attending: Internal Medicine

## 2017-12-08 ENCOUNTER — Encounter (HOSPITAL_COMMUNITY): Payer: Self-pay | Admitting: *Deleted

## 2017-12-08 DIAGNOSIS — I700 Atherosclerosis of aorta: Secondary | ICD-10-CM

## 2017-12-08 DIAGNOSIS — R7881 Bacteremia: Secondary | ICD-10-CM

## 2017-12-08 DIAGNOSIS — I517 Cardiomegaly: Secondary | ICD-10-CM

## 2017-12-08 HISTORY — PX: TEE WITHOUT CARDIOVERSION: SHX5443

## 2017-12-08 LAB — BASIC METABOLIC PANEL
Anion gap: 9 (ref 5–15)
BUN: 11 mg/dL (ref 8–23)
CHLORIDE: 103 mmol/L (ref 98–111)
CO2: 28 mmol/L (ref 22–32)
Calcium: 8.6 mg/dL — ABNORMAL LOW (ref 8.9–10.3)
Creatinine, Ser: 0.58 mg/dL (ref 0.44–1.00)
GFR calc Af Amer: >60 mL/min (ref >60)
GFR calc non Af Amer: >60 mL/min (ref >60)
GLUCOSE: 70 mg/dL (ref 70–99)
POTASSIUM: 3.8 mmol/L (ref 3.5–5.1)
Sodium: 140 mmol/L (ref 135–145)

## 2017-12-08 LAB — GLUCOSE, CAPILLARY
Glucose-Capillary: 82 mg/dL (ref 70–99)
Glucose-Capillary: 78 mg/dL (ref 70–99)

## 2017-12-08 SURGERY — ECHOCARDIOGRAM, TRANSESOPHAGEAL
Anesthesia: Moderate Sedation

## 2017-12-08 MED ORDER — BUTAMBEN-TETRACAINE-BENZOCAINE 2-2-14 % EX AERO
INHALATION_SPRAY | CUTANEOUS | Status: DC | PRN
  Administered 2017-12-08: 2 via TOPICAL

## 2017-12-08 MED ORDER — FENTANYL CITRATE (PF) 100 MCG/2ML IJ SOLN
INTRAMUSCULAR | Status: DC | PRN
  Administered 2017-12-08 (×3): 25 ug via INTRAVENOUS

## 2017-12-08 MED ORDER — CEFAZOLIN IV (FOR PTA / DISCHARGE USE ONLY): 2.0000 g | Freq: Three times a day (TID) | INTRAVENOUS | 0 refills | Status: DC

## 2017-12-08 MED ORDER — SODIUM CHLORIDE 0.9% FLUSH: 10.0000 mL | INTRAVENOUS | Status: DC | PRN

## 2017-12-08 MED ORDER — MIDAZOLAM HCL 5 MG/ML IJ SOLN
INTRAMUSCULAR | Status: AC
  Filled 2017-12-08: qty 2

## 2017-12-08 MED ORDER — MIDAZOLAM HCL 10 MG/2ML IJ SOLN
INTRAMUSCULAR | Status: DC | PRN
  Administered 2017-12-08: 2 mg via INTRAVENOUS
  Administered 2017-12-08: 1 mg via INTRAVENOUS
  Administered 2017-12-08: 2 mg via INTRAVENOUS
  Administered 2017-12-08: 1 mg via INTRAVENOUS
  Administered 2017-12-08: 2 mg via INTRAVENOUS

## 2017-12-08 MED ORDER — FENTANYL CITRATE (PF) 100 MCG/2ML IJ SOLN
INTRAMUSCULAR | Status: AC
  Filled 2017-12-08: qty 2

## 2017-12-08 MED ORDER — SODIUM CHLORIDE 0.9% FLUSH
10.0000 mL | Freq: Two times a day (BID) | INTRAVENOUS | Status: DC
  Administered 2017-12-08: 10 mL

## 2017-12-08 NOTE — Discharge Summary (Signed)
Discharge Summary  BEVA REMUND JSH:[id number] DOB: 07-Jun-1932  PCP: Donald Prose, MD  Admit date: 11/30/2017 Discharge date: 12/08/2017   Time spent: < 25 minutes  Admitted From: home Disposition:  home  Recommendations for Outpatient Follow-up:  1. Follow up with PCP in 1 to 2 weeks 2. Continue cefazolin 2 g IV every 8 hours, end date 12/30/2017 3. Dr. Megan Salon (infectious disease) we will arrange follow-up on 12/30/2017 to determine if further antibiotic therapy is necessary 4. Follow-up 3 days after discharge with Dr. Grandville Silos (orthopedics) to monitor wrist and determine further therapy needs    Discharge Diagnoses:  Active Hospital Problems   Diagnosis Date Noted  . Bacteremia due to methicillin susceptible Staphylococcus aureus (MSSA) 12/02/2017  . Type 2 diabetes mellitus without complication ([place]) 85/05/7739  . Tenosynovitis of left wrist 12/02/2017  . Infection of left wrist ([place]) 11/30/2017  . Lymphedema of upper extremity 01/17/2014  . HTN (hypertension) 03/25/2013  . Chest wall recurrence of breast cancer [facility]) 02/21/2013    Resolved Hospital Problems  No resolved problems to display.    Discharge Condition: Stable   CODE STATUS:FUll CODE  Diet recommendation:    Vitals:   12/08/17 1111 12/08/17 1528  BP: (!) 125/43 (!) 155/54  Pulse: 81 78  Resp: (!) 23   Temp:    SpO2: 93% 97%    History of present illness:  Tami Schmidt is a 84 y.o. year old female with medical history significant for breast cancer status post bilateral mastectomy recurrent lymphedema, type 2 diabetes, HTN, and HLD who presented on 11/30/2017 with left hand swelling and pain for the past week and was found to have extensive left wrist tenosynovitis secondary to MSSA.Extensive tenosynovitis confirmed on MRI. She underwent left wrist ultrasound-guided aspiration of flexor tendons and carpus on 8/20 (the wrist joint was not aspirated).  Blood cultures from 8/19 growing MSSA.   Empirically started on vancomycin cefepime on day of admission.  Switched to cefazolin to cover MSSA bacteria on 8/21. Repeat Blood cultures on 12/02/17 remained negative  Remaining hospital course addressed in problem based format below:   Hospital Course:   Extensive left wrist tenosynovitis secondary to MSSA, collocated by MSSA bacteremia, improved.  Tenosynovitis confirmed on MRI, underwent ultrasound-guided aspiration of flexor tendons and carpus on 8/20.  Blood cultures from 8/19 growing MSSA.  Patient is empiric vancomycin and cefepime started on day of admission was switched to cefazolin.  Repeat blood cultures 12/02/2017 remain negative.  TEE on 8/27 ruled out endocarditis.  Patient to continue IV cefazolin for total 4 weeks.  Pain control initially with NSAIDs (Mobic).  Type 2 diabetes, controlled, A1c 6.6%.  Can resume home metformin on discharge.  Continue NPH 40 units daily.  Hypertension.  A few episodes of poor control in hospital stay while holding home HCTZ/losartan combination while on NSAIDs.  Should be able to resume combo pill now that off NSAIDs.   Consultations:  Infectious disease, hand surgery  Procedures/Studies:  TEE, 12/08/2017: Normal LV function, no vegetations. Discharge Exam: BP (!) 155/54 (BP Location: Right Leg)   Pulse 78   Temp 98.5 F (36.9 C) (Oral)   Resp (!) 23   Ht _0  (1.676 m)   Wt 95.3 kg   SpO2 97%   BMI 33.89 kg/m   Constitutional:normal appearing female Eyes: EOMI, anicteric, normal conjunctivae ENMT: Oropharynx with moist mucous membranes, normal dentition Cardiovascular: RRR no MRGs, with no peripheral edema Respiratory: Normal respiratory effort, clear breath sounds  Skin: Noticeably less wrist swelling, in ace bandage.   Neurologic: Grossly no focal neuro deficit. Psychiatric:Appropriate affect, and mood. Mental status AAOx3   Discharge Instructions You were cared for by a hospitalist during your hospital stay. If you have any  questions about your discharge medications or the care you received while you were in the hospital after you are discharged, you can call the unit and asked to speak with the hospitalist on call if the hospitalist that took care of you is not available. Once you are discharged, your primary care physician will handle any further medical issues. Please note that NO REFILLS for any discharge medications will be authorized once you are discharged, as it is imperative that you return to your primary care physician (or establish a relationship with a primary care physician if you do not have one) for your aftercare needs so that they can reassess your need for medications and monitor your lab values.  Discharge Instructions    Diet - low sodium heart healthy   Complete by:  As directed    Home infusion instructions [facility] May follow [place] Dosing Protocol; May administer Cathflo as needed to maintain patency of vascular access device.; Flushing of vascular access device: per [facility] Protocol: 0.9% NaCl pre/post medica...   Complete by:  As directed    Instructions:  May follow [place] Dosing Protocol   Instructions:  May administer Cathflo as needed to maintain patency of vascular access device.   Instructions:  Flushing of vascular access device: per [facility] Protocol: 0.9% NaCl pre/post medication administration and prn patency; Heparin 100 u/ml, 60m for implanted ports and Heparin 10u/ml, 562mfor all other central venous catheters.   Instructions:  May follow [facility] Anaphylaxis Protocol for First Dose Administration in the home: 0.9% NaCl at 25-50 ml/hr to maintain IV access for protocol meds. Epinephrine 0.3 ml IV/IM PRN and Benadryl 25-50 IV/IM PRN s/s of anaphylaxis.   Instructions:  [facility] Coordinator (RN) to assist per patient IV care needs in the home PRN.   Increase activity slowly   Complete by:  As directed      Allergies as of 12/08/2017      Reactions    Bactrim Swelling   SWELLING OF MOUTH/FACE.   Lisinopril Swelling   SWELLING OF MOUTH/FACE.   Vasotec Swelling   SWELLING OF MOUTH/FACE.      Medication List    STOP taking these medications   Lidocaine 1.8 % Ptch     TAKE these medications   amLODipine 10 MG tablet Commonly known as:  NORVASC Take 10 mg by mouth daily with breakfast.   aspirin EC 81 MG tablet Take 81 mg by mouth daily.   ceFAZolin  IVPB Commonly known as:  ANCEF Inject 2 g into the vein every 8 (eight) hours for 22 days. Indication: Bacteremia Last Day of Therapy:  12/30/2017 Labs - Once weekly:  CBC/D and BMP, Labs - Every other week:  ESR and CRP   fish oil-omega-3 fatty acids 1000 MG capsule Take 1 g by mouth daily with breakfast.   HUMULIN N 100 UNIT/ML injection Generic drug:  insulin NPH Human Inject 40 Units into the skin daily.   losartan-hydrochlorothiazide 100-25 MG tablet Commonly known as:  HYZAAR Take 1 tablet by mouth daily with breakfast.   metFORMIN 500 MG tablet Commonly known as:  GLUCOPHAGE Take 500 mg by mouth daily with breakfast.   tamoxifen 20 MG tablet Commonly known as:  NOLVADEX Take 1 tablet (20 mg total) by mouth daily.            Home Infusion Instuctions  (From admission, onward)         Start     Ordered   12/08/17 0000  Home infusion instructions [facility] May follow [place] Dosing Protocol; May administer Cathflo as needed to maintain patency of vascular access device.; Flushing of vascular access device: per [facility] Protocol: 0.9% NaCl pre/post medica...    Question Answer Comment  Instructions May follow [place] Dosing Protocol   Instructions May administer Cathflo as needed to maintain patency of vascular access device.   Instructions Flushing of vascular access device: per [facility] Protocol: 0.9% NaCl pre/post medication administration and prn patency; Heparin 100 u/ml, 46m for implanted ports and Heparin 10u/ml, 510mfor all other central  venous catheters.   Instructions May follow [facility] Anaphylaxis Protocol for First Dose Administration in the home: 0.9% NaCl at 25-50 ml/hr to maintain IV access for protocol meds. Epinephrine 0.3 ml IV/IM PRN and Benadryl 25-50 IV/IM PRN s/s of anaphylaxis.   Instructions [facility] Infusion Coordinator (RN) to assist per patient IV care needs in the home PRN.      12/08/17 1422         Allergies  Allergen Reactions  . Bactrim Swelling    SWELLING OF MOUTH/FACE.  . [place]land Kitchenisinopril Swelling    SWELLING OF MOUTH/FACE.  . [place] Kitchenasotec Swelling    SWELLING OF MOUTH/FACE.   Follow-up Information    ThMilly JakobMD. Schedule an appointment as soon as possible for a visit.   Specialty:  Orthopedic Surgery Why:  for a couple of days following discharge from the hospital to check your finger condition and determine if continued outpatient hand therapy would be beneficial Contact information: [phone]        [facility], [facility]-Home Follow up.   Specialty:  Home Health Services Why:  For home health nursing. They will be in contact in the next 24 hours to start services at your home Contact information: [address] [phone]          The results of significant diagnostics from this hospitalization (including imaging, microbiology, ancillary and laboratory) are listed below for reference.    Significant Diagnostic Studies: Ct Angio Chest Pe W/cm &/or Wo Cm  Result Date: 11/30/2017 CLINICAL DATA:  Left arm swelling beginning on Monday. Shortness of breath with exertion. EXAM: CT ANGIOGRAPHY CHEST WITH CONTRAST TECHNIQUE: Multidetector CT imaging of the chest was performed using the standard protocol during bolus administration of intravenous contrast. Multiplanar CT image reconstructions and MIPs were obtained to evaluate the vascular anatomy. CONTRAST:  1005mSOVUE-370 IOPAMIDOL (ISOVUE-370)  INJECTION 76% COMPARISON:  01/20/2017 FINDINGS: Cardiovascular: Pulmonary arterial opacification is excellent. There are no pulmonary emboli. There is aortic atherosclerosis. The heart is mildly enlarged. Mediastinum/Nodes: No mediastinal or hilar mass or lymphadenopathy. Lungs/Pleura: No pulmonary parenchymal pathology.  No pleural fluid. Upper Abdomen: Negative Musculoskeletal: There is subpectoral adenopathy on the left with some calcification. This was present on the previous exam. No acute bone finding. Chronic ankylosis of the spine. Thyroid nodules/cysts as seen previously. Review of the MIP images confirms the above findings. IMPRESSION: No pulmonary emboli or acute chest pathology. Subpectoral adenopathy on the left as seen previously. Aortic Atherosclerosis (ICD10-I70.0). Electronically Signed   By: MarNelson ChimesD.   On: 11/30/2017 20:22  US Guided Needle Placement  Result Date: 12/01/2017 INDICATION: Tenosynovitis of left hand and wrist. EXAM: Aspiration of flexor tendon sheath at the left wrist. MEDICATIONS: The patient is currently admitted to the hospital and receiving intravenous antibiotics. The antibiotics were administered within an appropriate time frame prior to the initiation of the procedure. ANESTHESIA/SEDATION: Lidocaine 1% local anesthetic. COMPLICATIONS: None immediate. PROCEDURE: Informed written consent was obtained from the patient after a thorough discussion of the procedural risks, benefits and alternatives. All questions were addressed. A timeout was performed prior to the initiation of the procedure. Using sterile technique and local anesthesia, I inserted an 18 gauge needle into a flexor tendon sheath in the distal left forearm using direct ultrasound guidance to avoid the vessels and tendons. The needle was placed into a tendon sheath and 1 cc of cloudy yellow fluid was aspirated without difficulty. The patient tolerated the procedure well. There were no immediate  complications. IMPRESSION: Tendon sheath aspiration of the distal left forearm just above the radiocarpal joint was performed without immediate complication. Laboratory reports to follow. Electronically Signed   By: Lorriane Shire M.D.   On: 12/01/2017 12:22   Mr Wrist Left Wo Contrast  Result Date: 11/30/2017 CLINICAL DATA:  84 year old female with history of breast cancer and lymphedema of the left upper extremity presents with pain and swelling of the left hand and wrist. EXAM: MR OF THE LEFT WRIST WITHOUT CONTRAST TECHNIQUE: Multiplanar, multisequence MR imaging of the left wrist was performed. No intravenous contrast was administered. COMPARISON:  None. FINDINGS: Limited exam due to patient body habitus and inability maintain adequate positioning of the left wrist. Ligaments: Intact scapholunate and lunotriquetral ligaments. Triangular fibrocartilage: Intact TFCC. Tendons: Extensive tenosynovitis noted with fluid outlining the flexor extensor tendons of the wrist. Carpal tunnel/median nerve: Intact Guyon's canal: Intact Joint/cartilage: No focal chondral defects. Osteoarthritic joint space narrowing of the radiocarpal joint with chondral thinning suggested. Trace fluid in the distal radioulnar joint without definite tear of the triangular fibrocartilage. Small radiocarpal and midcarpal joint effusions. Bones/carpal alignment: Scattered erosions of the proximal and distal carpal rows. No frank bone destruction or fracture. Other: Nonspecific diffuse soft tissue swelling of the included distal forearm and wrist possibly associated with the patient's underlying history of lymphedema. Cellulitis is the for intra possibility. IMPRESSION: Joint effusions involving the distal radioulnar, radiocarpal and midcarpal joints. Scattered carpal erosions are noted. This in conjunction with extensive tenosynovitis may be seen with inflammatory arthropathy such as rheumatoid. No acute osseous abnormality given limitations  due to patient body habitus and inability to position adequately for routine imaging. Electronically Signed   By: Ashley Royalty M.D.   On: 11/30/2017 23:39   [place] Ekg Site Rite  Result Date: 12/07/2017 If Site Rite image not attached, placement could not be confirmed due to current cardiac rhythm.   Microbiology: Recent Results (from the past 240 hour(s))  Blood culture (routine x 2)     Status: None   Collection Time: 11/30/17  2:33 PM  Result Value Ref Range Status   Specimen Description BLOOD RIGHT HAND  Final   Special Requests   Final    BOTTLES DRAWN AEROBIC AND ANAEROBIC Blood Culture adequate volume   Culture   Final    NO GROWTH 5 DAYS Performed at [facility], [address] [place], [place] [address]    Report Status 12/05/2017 FINAL  Final  Blood culture (routine x 2)     Status: Abnormal   Collection Time: 11/30/17  2:51 PM  Result Value Ref Range Status   Specimen Description BLOOD RIGHT WRIST  Final   Special Requests   Final    BOTTLES DRAWN AEROBIC AND ANAEROBIC Blood Culture results may not be optimal due to an inadequate volume of blood received in culture bottles   Culture  Setup Time   Final    AEROBIC BOTTLE ONLY GRAM POSITIVE COCCI CRITICAL RESULT CALLED TO, READ BACK BY AND VERIFIED WITH: Mathews Robinsons [facility] 12/01/17 2006 JDW Performed at [facility], [place] [address] [place], [place] [address]    Culture STAPHYLOCOCCUS AUREUS (A)  Final   Report Status 12/03/2017 FINAL  Final   Organism ID, Bacteria STAPHYLOCOCCUS AUREUS  Final      Susceptibility   Staphylococcus aureus - MIC*    CIPROFLOXACIN <=0.5 SENSITIVE Sensitive     ERYTHROMYCIN <=0.25 SENSITIVE Sensitive     GENTAMICIN <=0.5 SENSITIVE Sensitive     OXACILLIN <=0.25 SENSITIVE Sensitive     TETRACYCLINE <=1 SENSITIVE Sensitive     VANCOMYCIN <=0.5 SENSITIVE Sensitive     TRIMETH/SULFA <=10 SENSITIVE Sensitive     CLINDAMYCIN <=0.25 SENSITIVE Sensitive     RIFAMPIN <=0.5 SENSITIVE  Sensitive     Inducible Clindamycin NEGATIVE Sensitive     * STAPHYLOCOCCUS AUREUS  Blood Culture ID Panel (Reflexed)     Status: Abnormal   Collection Time: 11/30/17  2:51 PM  Result Value Ref Range Status   Enterococcus species NOT DETECTED NOT DETECTED Final   Listeria monocytogenes NOT DETECTED NOT DETECTED Final   Staphylococcus species DETECTED (A) NOT DETECTED Final    Comment: CRITICAL RESULT CALLED TO, READ BACK BY AND VERIFIED WITH: K PERKINS PHARMD 12/01/17 2006 JDW    Staphylococcus aureus DETECTED (A) NOT DETECTED Final    Comment: Methicillin (oxacillin) susceptible Staphylococcus aureus (MSSA). Preferred therapy is anti staphylococcal beta lactam antibiotic (Cefazolin or Nafcillin), unless clinically contraindicated. CRITICAL RESULT CALLED TO, READ BACK BY AND VERIFIED WITH: K PERKINS PHARMD 12/01/17 2006 JDW    Methicillin resistance NOT DETECTED NOT DETECTED Final   Streptococcus species NOT DETECTED NOT DETECTED Final   Streptococcus agalactiae NOT DETECTED NOT DETECTED Final   Streptococcus pneumoniae NOT DETECTED NOT DETECTED Final   Streptococcus pyogenes NOT DETECTED NOT DETECTED Final   Acinetobacter baumannii NOT DETECTED NOT DETECTED Final   Enterobacteriaceae species NOT DETECTED NOT DETECTED Final   Enterobacter cloacae complex NOT DETECTED NOT DETECTED Final   Escherichia coli NOT DETECTED NOT DETECTED Final   Klebsiella oxytoca NOT DETECTED NOT DETECTED Final   Klebsiella pneumoniae NOT DETECTED NOT DETECTED Final   Proteus species NOT DETECTED NOT DETECTED Final   Serratia marcescens NOT DETECTED NOT DETECTED Final   Haemophilus influenzae NOT DETECTED NOT DETECTED Final   Neisseria meningitidis NOT DETECTED NOT DETECTED Final   Pseudomonas aeruginosa NOT DETECTED NOT DETECTED Final   Candida albicans NOT DETECTED NOT DETECTED Final   Candida glabrata NOT DETECTED NOT DETECTED Final   Candida krusei NOT DETECTED NOT DETECTED Final   Candida parapsilosis  NOT DETECTED NOT DETECTED Final   Candida tropicalis NOT DETECTED NOT DETECTED Final    Comment: Performed at [facility], [place] [address] [place], [place] [address]  Body fluid culture     Status: None   Collection Time: 12/01/17 11:53 AM  Result Value Ref Range Status   Specimen Description SYNOVIAL LEFT WRIST  Final   Special Requests NONE  Final   Gram Stain   Final  RARE WBC PRESENT,BOTH PMN AND MONONUCLEAR NO ORGANISMS SEEN    Culture   Final    NO GROWTH 3 DAYS Performed at [facility], [place] [address], [place], [place] [address]    Report Status 12/04/2017 FINAL  Final  Culture, blood (routine x 2)     Status: None   Collection Time: 12/02/17  8:57 AM  Result Value Ref Range Status   Specimen Description BLOOD RIGHT HAND  Final   Special Requests   Final    BOTTLES DRAWN AEROBIC AND ANAEROBIC Blood Culture adequate volume   Culture   Final    NO GROWTH 5 DAYS Performed at [facility], [place] [address], [place], [place] [address]    Report Status 12/07/2017 FINAL  Final  Culture, blood (routine x 2)     Status: None   Collection Time: 12/02/17  9:08 AM  Result Value Ref Range Status   Specimen Description BLOOD RIGHT ARM  Final   Special Requests   Final    BOTTLES DRAWN AEROBIC AND ANAEROBIC Blood Culture adequate volume   Culture   Final    NO GROWTH 5 DAYS Performed at [facility], [place] [address], [place], [place] [address]    Report Status 12/07/2017 FINAL  Final  MRSA PCR Screening     Status: None   Collection Time: 12/04/17  6:12 AM  Result Value Ref Range Status   MRSA by PCR NEGATIVE NEGATIVE Final    Comment:        The GeneXpert MRSA Assay (FDA approved for NASAL specimens only), is one component of a comprehensive MRSA colonization surveillance program. It is not intended to diagnose MRSA infection nor to guide or monitor treatment for MRSA infections. Performed at [facility], [place] [address], [place],  [place] [address]      Labs: Basic Metabolic Panel: Recent Labs  Lab 12/08/17 0630  NA 140  K 3.8  CL 103  CO2 28  GLUCOSE 70  BUN 11  CREATININE 0.58  CALCIUM 8.6*   Liver Function Tests: No results for input(s): AST, ALT, ALKPHOS, BILITOT, PROT, ALBUMIN in the last 168 hours. No results for input(s): LIPASE, AMYLASE in the last 168 hours. No results for input(s): AMMONIA in the last 168 hours. CBC: No results for input(s): WBC, NEUTROABS, HGB, HCT, MCV, PLT in the last 168 hours. Cardiac Enzymes: No results for input(s): CKTOTAL, CKMB, CKMBINDEX, TROPONINI in the last 168 hours. BNP: BNP (last 3 results) No results for input(s): BNP in the last 8760 hours.  ProBNP (last 3 results) No results for input(s): PROBNP in the last 8760 hours.  CBG: Recent Labs  Lab 12/07/17 1135 12/07/17 1618 12/07/17 2053 12/08/17 0737 12/08/17 1148  GLUCAP 127* 96 115* 82 78       Signed:  Desiree Hane, MD [facility] 12/08/2017, 6:08 PM

## 2017-12-08 NOTE — Progress Notes (Signed)
    Transesophageal Echocardiogram Note  Tami Schmidt [id number] 1932-11-10  Procedure: Transesophageal Echocardiogram Indications: Bacteremia  Procedure Details Consent: Obtained Time Out: Verified patient identification, verified procedure, site/side was marked, verified correct patient position, special equipment/implants available, Radiology Safety Procedures followed,  medications/allergies/relevent history reviewed, required imaging and test results available.  Performed  Medications:  During this procedure the patient is administered a total of Versed 8 mg and Fentanyl 75 mcg  to achieve and maintain moderate conscious sedation.  The patient's heart rate, blood pressure, and oxygen saturation are monitored continuously during the procedure. The period of conscious sedation is 30 minutes, of which I was present face-to-face 100% of this time.  Normal LV function; no vegetations; full report to follow.   Complications: No apparent complications Patient did tolerate procedure well.  Kirk Ruths, MD

## 2017-12-08 NOTE — Progress Notes (Addendum)
Patient ID: Tami Schmidt, female   DOB: Nov 08, 1932, 84 y.o.   MRN: [id number]         [facility] for Infectious Disease  Date of Admission:  11/30/2017   Total days of antibiotics 9        Day 7 cefazolin         ASSESSMENT: She has MSSA bacteremia complicated by probable left wrist and hand infection.  She is improving with antibiotic therapy and does not appear to need surgery.  Repeat blood cultures on 12/02/2017 are negative so she can have a PICC placed in now.  There was no evidence of endocarditis on TEE this morning.  Plan on 4 weeks of cefazolin therapy  PLAN: 1. Continue cefazolin 2. PICC placement  Diagnosis: Bacteremia and left hand infection  Culture Result: MSSA  Allergies  Allergen Reactions  . Bactrim Swelling    SWELLING OF MOUTH/FACE.  Marland Kitchen Lisinopril Swelling    SWELLING OF MOUTH/FACE.  Marland Kitchen Vasotec Swelling    SWELLING OF MOUTH/FACE.    OPAT Orders Discharge antibiotics: Per pharmacy protocol cefazolin  Duration: 4 weeks End Date: 12/30/2017  [facility] Care Per Protocol:  Labs weekly while on IV antibiotics: _x_ CBC with differential _x_ BMP __ CMP _x_ CRP _x_ ESR __ Vancomycin trough  __ Please pull PIC at completion of IV antibiotics _x_ Please leave PIC in place until doctor has seen patient or been notified  Fax weekly labs to [phone]  Clinic Follow Up Appt: 12/30/2017  Principal Problem:   Bacteremia due to methicillin susceptible Staphylococcus aureus (MSSA) Active Problems:   Infection of left wrist ([place])   Chest wall recurrence of breast cancer ([place])   HTN (hypertension)   Lymphedema of upper extremity   Tenosynovitis of left wrist   Type 2 diabetes mellitus without complication (HCC)   Scheduled Meds: . acetaminophen  650 mg Oral Q6H  . amLODipine  10 mg Oral Q breakfast  . aspirin EC  81 mg Oral Daily  . heparin  5,000 Units Subcutaneous Q8H  . hydrochlorothiazide  25 mg Oral Daily  . insulin aspart  0-15 Units  Subcutaneous TID WC  . insulin aspart  0-5 Units Subcutaneous QHS  . insulin NPH Human  40 Units Subcutaneous Q breakfast  . omega-3 acid ethyl esters  1 g Oral Q breakfast  . pantoprazole  40 mg Oral Daily  . tamoxifen  20 mg Oral Daily   Continuous Infusions: .  ceFAZolin (ANCEF) IV 2 g (12/08/17 5456)   PRN Meds:.docusate sodium, iopamidol, iopamidol, polyethylene glycol   Review of Systems: Review of Systems  Unable to perform ROS: Mental acuity    Allergies  Allergen Reactions  . Bactrim Swelling    SWELLING OF MOUTH/FACE.  Marland Kitchen Lisinopril Swelling    SWELLING OF MOUTH/FACE.  Marland Kitchen Vasotec Swelling    SWELLING OF MOUTH/FACE.    OBJECTIVE: Vitals:   12/08/17 1050 12/08/17 1055 12/08/17 1101 12/08/17 1111  BP: (!) 182/66 (!) 150/53 (!) 111/44 (!) 125/43  Pulse: 96 98 83 81  Resp: 20 (!) 29 (!) 25 (!) 23  Temp:   98.5 F (36.9 C)   TempSrc:   Oral   SpO2: 99% 100% 95% 93%  Weight:      Height:       Body mass index is 33.89 kg/m.  Physical Exam  Constitutional:  She is sound asleep after TEE    Lab Results Lab Results  Component Value Date   WBC  14.4 (H) 12/01/2017   HGB 10.8 (L) 12/01/2017   HCT 34.2 (L) 12/01/2017   MCV 77.2 (L) 12/01/2017   PLT 294 12/01/2017    Lab Results  Component Value Date   CREATININE 0.58 12/08/2017   BUN 11 12/08/2017   NA 140 12/08/2017   K 3.8 12/08/2017   CL 103 12/08/2017   CO2 28 12/08/2017    Lab Results  Component Value Date   ALT 14 12/01/2017   AST 18 12/01/2017   ALKPHOS 51 12/01/2017   BILITOT 0.7 12/01/2017     Microbiology: Recent Results (from the past 240 hour(s))  Blood culture (routine x 2)     Status: None   Collection Time: 11/30/17  2:33 PM  Result Value Ref Range Status   Specimen Description BLOOD RIGHT HAND  Final   Special Requests   Final    BOTTLES DRAWN AEROBIC AND ANAEROBIC Blood Culture adequate volume   Culture   Final    NO GROWTH 5 DAYS Performed at [facility],  [place] [address], [place], [place] [address]    Report Status 12/05/2017 FINAL  Final  Blood culture (routine x 2)     Status: Abnormal   Collection Time: 11/30/17  2:51 PM  Result Value Ref Range Status   Specimen Description BLOOD RIGHT WRIST  Final   Special Requests   Final    BOTTLES DRAWN AEROBIC AND ANAEROBIC Blood Culture results may not be optimal due to an inadequate volume of blood received in culture bottles   Culture  Setup Time   Final    AEROBIC BOTTLE ONLY GRAM POSITIVE COCCI CRITICAL RESULT CALLED TO, READ BACK BY AND VERIFIED WITH: Mathews Robinsons [facility] 12/01/17 2006 JDW Performed at [facility], [place] [address] [place], [place] [address]    Culture STAPHYLOCOCCUS AUREUS (A)  Final   Report Status 12/03/2017 FINAL  Final   Organism ID, Bacteria STAPHYLOCOCCUS AUREUS  Final      Susceptibility   Staphylococcus aureus - MIC*    CIPROFLOXACIN <=0.5 SENSITIVE Sensitive     ERYTHROMYCIN <=0.25 SENSITIVE Sensitive     GENTAMICIN <=0.5 SENSITIVE Sensitive     OXACILLIN <=0.25 SENSITIVE Sensitive     TETRACYCLINE <=1 SENSITIVE Sensitive     VANCOMYCIN <=0.5 SENSITIVE Sensitive     TRIMETH/SULFA <=10 SENSITIVE Sensitive     CLINDAMYCIN <=0.25 SENSITIVE Sensitive     RIFAMPIN <=0.5 SENSITIVE Sensitive     Inducible Clindamycin NEGATIVE Sensitive     * STAPHYLOCOCCUS AUREUS  Blood Culture ID Panel (Reflexed)     Status: Abnormal   Collection Time: 11/30/17  2:51 PM  Result Value Ref Range Status   Enterococcus species NOT DETECTED NOT DETECTED Final   Listeria monocytogenes NOT DETECTED NOT DETECTED Final   Staphylococcus species DETECTED (A) NOT DETECTED Final    Comment: CRITICAL RESULT CALLED TO, READ BACK BY AND VERIFIED WITH: K PERKINS PHARMD 12/01/17 2006 JDW    Staphylococcus aureus DETECTED (A) NOT DETECTED Final    Comment: Methicillin (oxacillin) susceptible Staphylococcus aureus (MSSA). Preferred therapy is anti staphylococcal beta lactam antibiotic (Cefazolin or  Nafcillin), unless clinically contraindicated. CRITICAL RESULT CALLED TO, READ BACK BY AND VERIFIED WITH: K PERKINS PHARMD 12/01/17 2006 JDW    Methicillin resistance NOT DETECTED NOT DETECTED Final   Streptococcus species NOT DETECTED NOT DETECTED Final   Streptococcus agalactiae NOT DETECTED NOT DETECTED Final   Streptococcus pneumoniae NOT DETECTED NOT DETECTED Final   Streptococcus pyogenes  NOT DETECTED NOT DETECTED Final   Acinetobacter baumannii NOT DETECTED NOT DETECTED Final   Enterobacteriaceae species NOT DETECTED NOT DETECTED Final   Enterobacter cloacae complex NOT DETECTED NOT DETECTED Final   Escherichia coli NOT DETECTED NOT DETECTED Final   Klebsiella oxytoca NOT DETECTED NOT DETECTED Final   Klebsiella pneumoniae NOT DETECTED NOT DETECTED Final   Proteus species NOT DETECTED NOT DETECTED Final   Serratia marcescens NOT DETECTED NOT DETECTED Final   Haemophilus influenzae NOT DETECTED NOT DETECTED Final   Neisseria meningitidis NOT DETECTED NOT DETECTED Final   Pseudomonas aeruginosa NOT DETECTED NOT DETECTED Final   Candida albicans NOT DETECTED NOT DETECTED Final   Candida glabrata NOT DETECTED NOT DETECTED Final   Candida krusei NOT DETECTED NOT DETECTED Final   Candida parapsilosis NOT DETECTED NOT DETECTED Final   Candida tropicalis NOT DETECTED NOT DETECTED Final    Comment: Performed at [facility], [place] [address], [place], [place] [address]  Body fluid culture     Status: None   Collection Time: 12/01/17 11:53 AM  Result Value Ref Range Status   Specimen Description SYNOVIAL LEFT WRIST  Final   Special Requests NONE  Final   Gram Stain   Final    RARE WBC PRESENT,BOTH PMN AND MONONUCLEAR NO ORGANISMS SEEN    Culture   Final    NO GROWTH 3 DAYS Performed at [facility], [place] [address] [place], [place] [address]    Report Status 12/04/2017 FINAL  Final  Culture, blood (routine x 2)     Status: None   Collection Time: 12/02/17  8:57 AM    Result Value Ref Range Status   Specimen Description BLOOD RIGHT HAND  Final   Special Requests   Final    BOTTLES DRAWN AEROBIC AND ANAEROBIC Blood Culture adequate volume   Culture   Final    NO GROWTH 5 DAYS Performed at [facility], [place] [address] [place], [place] [address]    Report Status 12/07/2017 FINAL  Final  Culture, blood (routine x 2)     Status: None   Collection Time: 12/02/17  9:08 AM  Result Value Ref Range Status   Specimen Description BLOOD RIGHT ARM  Final   Special Requests   Final    BOTTLES DRAWN AEROBIC AND ANAEROBIC Blood Culture adequate volume   Culture   Final    NO GROWTH 5 DAYS Performed at [facility], [place] [address], [place], [place] [address]    Report Status 12/07/2017 FINAL  Final  MRSA PCR Screening     Status: None   Collection Time: 12/04/17  6:12 AM  Result Value Ref Range Status   MRSA by PCR NEGATIVE NEGATIVE Final    Comment:        The GeneXpert MRSA Assay (FDA approved for NASAL specimens only), is one component of a comprehensive MRSA colonization surveillance program. It is not intended to diagnose MRSA infection nor to guide or monitor treatment for MRSA infections. Performed at [facility], [place] [address] [place], [place] [address]     Michel Bickers, [place] for [facility] [phone] pager   [phone] cell 12/08/2017, 11:38 AM

## 2017-12-08 NOTE — Care Management Note (Signed)
Case Management Note  Patient Details  Name: Tami Schmidt MRN: [id number] Date of Birth: 12-19-1932  Subjective/Objective:                 bacteremia   Action/Plan:  Patient with plan to dc to home today. She will have IV abx, she will support of spouse to administer IV Abx. She would like to use [facility] for IV Abx and HH RN. Referrals placed to Waunita Schooner w [facility]. SPouse will be in hospital in next 30 minutes for PICC line education. Updated Pam w [facility]. No DME needs identified.   Expected Discharge Date:                  Expected Discharge Plan:  [place]  In-House Referral:     Discharge planning Services  CM Consult  Post Acute Care Choice:  Home Health Choice offered to:  Patient  DME Arranged:    DME Agency:     HH Arranged:  RN, IV Antibiotics HH Agency:  [place]  Status of Service:  Completed, signed off  If discussed at [place] of Stay Meetings, dates discussed:    Additional Comments:  Carles Collet, RN 12/08/2017, 1:12 PM

## 2017-12-08 NOTE — Interval H&P Note (Signed)
History and Physical Interval Note:  12/08/2017 10:34 AM  Tami Schmidt  has presented today for surgery, with the diagnosis of bacteremia  The various methods of treatment have been discussed with the patient and family. After consideration of risks, benefits and other options for treatment, the patient has consented to  Procedure(s): TRANSESOPHAGEAL ECHOCARDIOGRAM (TEE) (N/A) as a surgical intervention .  The patient's history has been reviewed, patient examined, no change in status, stable for surgery.  I have reviewed the patient's chart and labs.  Questions were answered to the patient's satisfaction.     Kirk Ruths

## 2017-12-08 NOTE — Progress Notes (Signed)
PHARMACY CONSULT NOTE FOR:  OUTPATIENT  PARENTERAL ANTIBIOTIC THERAPY (OPAT)  Indication: Bacteremia Regimen: Cefazolin 2 gm IV q8hr End date: 12/30/2017  IV antibiotic discharge orders are pended. To discharging provider:  please sign these orders via discharge navigator,  Select New Orders & click on the button choice - Manage This Unsigned Work.     Thank you for allowing pharmacy to be a part of this patient's care.  Alanda Slim, PharmD, [facility] Clinical Pharmacist Please see AMION for all Pharmacists' Contact Phone Numbers 12/08/2017, 11:49 AM

## 2017-12-08 NOTE — Progress Notes (Signed)
  Echocardiogram Echocardiogram Transesophageal has been performed.  Tami Schmidt 12/08/2017, 3:08 PM

## 2017-12-08 NOTE — Progress Notes (Signed)
Peripherally Inserted Central Catheter/Midline Placement  The IV Nurse has discussed with the patient and/or persons authorized to consent for the patient, the purpose of this procedure and the potential benefits and risks involved with this procedure.  The benefits include less needle sticks, lab draws from the catheter, and the patient may be discharged home with the catheter. Risks include, but not limited to, infection, bleeding, blood clot (thrombus formation), and puncture of an artery; nerve damage and irregular heartbeat and possibility to perform a PICC exchange if needed/ordered by physician.  Alternatives to this procedure were also discussed.  Bard Power PICC patient education guide, fact sheet on infection prevention and patient information card has been provided to patient /or left at bedside.    PICC/Midline Placement Documentation  PICC Single Lumen 12/08/17 PICC Right Brachial 37 cm 0 cm (Active)  Indication for Insertion or Continuance of Line Home intravenous therapies (PICC only) 12/08/2017 12:57 PM  Exposed Catheter (cm) 0 cm 12/08/2017 12:57 PM  Site Assessment Clean;Dry;Intact 12/08/2017 12:57 PM  Line Status Flushed;Blood return noted;Saline locked 12/08/2017 12:57 PM  Dressing Type Transparent 12/08/2017 12:57 PM  Dressing Status Clean;Dry;Intact;Antimicrobial disc in place 12/08/2017 12:57 PM  Dressing Change Due 12/15/17 12/08/2017 12:57 PM       Scotty Court 12/08/2017, 1:00 PM

## 2017-12-10 ENCOUNTER — Telehealth: Payer: Self-pay | Admitting: Behavioral Health

## 2017-12-10 NOTE — Telephone Encounter (Addendum)
Called [facility] in IR to schedule Cath flow and possible PICC replacement if Cath flow is unsuccessful. Left message for Caryl Pina to call triage back ASAP.   Pricilla Riffle RN   Dr. Megan Salon gave verbal order for patient to have Cath flow administered in short stay and replacement PICC is Cath flow is unsuccessful Will follow up at get appointments coordinated. Pricilla Riffle RN

## 2017-12-11 ENCOUNTER — Other Ambulatory Visit: Payer: Self-pay | Admitting: Oncology

## 2017-12-11 ENCOUNTER — Encounter (HOSPITAL_COMMUNITY): Payer: Self-pay | Admitting: Emergency Medicine

## 2017-12-11 ENCOUNTER — Other Ambulatory Visit: Payer: Self-pay | Admitting: Internal Medicine

## 2017-12-11 ENCOUNTER — Encounter: Payer: Self-pay | Admitting: Behavioral Health

## 2017-12-11 ENCOUNTER — Emergency Department (HOSPITAL_COMMUNITY)
Admission: EM | Admit: 2017-12-11 | Discharge: 2017-12-11 | Disposition: A | Discharge status: Home / Self Care | Payer: Medicare Other | Attending: Emergency Medicine | Admitting: Emergency Medicine

## 2017-12-11 DIAGNOSIS — Z452 Encounter for adjustment and management of vascular access device: Secondary | ICD-10-CM | POA: Insufficient documentation

## 2017-12-11 DIAGNOSIS — R7881 Bacteremia: Principal | ICD-10-CM

## 2017-12-11 DIAGNOSIS — Z87891 Personal history of nicotine dependence: Secondary | ICD-10-CM | POA: Diagnosis not present

## 2017-12-11 DIAGNOSIS — E119 Type 2 diabetes mellitus without complications: Secondary | ICD-10-CM | POA: Diagnosis not present

## 2017-12-11 DIAGNOSIS — Z79899 Other long term (current) drug therapy: Secondary | ICD-10-CM | POA: Diagnosis not present

## 2017-12-11 DIAGNOSIS — Z853 Personal history of malignant neoplasm of breast: Secondary | ICD-10-CM | POA: Insufficient documentation

## 2017-12-11 DIAGNOSIS — Z7982 Long term (current) use of aspirin: Secondary | ICD-10-CM | POA: Diagnosis not present

## 2017-12-11 DIAGNOSIS — E7800 Pure hypercholesterolemia, unspecified: Secondary | ICD-10-CM | POA: Insufficient documentation

## 2017-12-11 DIAGNOSIS — C50312 Malignant neoplasm of lower-inner quadrant of left female breast: Secondary | ICD-10-CM

## 2017-12-11 DIAGNOSIS — I10 Essential (primary) hypertension: Secondary | ICD-10-CM | POA: Diagnosis not present

## 2017-12-11 DIAGNOSIS — B9561 Methicillin susceptible Staphylococcus aureus infection as the cause of diseases classified elsewhere: Secondary | ICD-10-CM

## 2017-12-11 DIAGNOSIS — Z794 Long term (current) use of insulin: Secondary | ICD-10-CM | POA: Diagnosis not present

## 2017-12-11 MED ORDER — CEPHALEXIN 500 MG PO CAPS: 500.0000 mg | ORAL_CAPSULE | Freq: Three times a day (TID) | ORAL | 0 refills | Status: DC

## 2017-12-11 MED ORDER — ALTEPLASE 2 MG IJ SOLR
2.0000 mg | Freq: Once | INTRAMUSCULAR | Status: AC
  Administered 2017-12-11: 2 mg
  Filled 2017-12-11: qty 2

## 2017-12-11 NOTE — Telephone Encounter (Signed)
Called [facility] Short Stay to get patient set up fpr Cath Flow due to inability to infuse IV antibiotics through  PICC line.  Only appointment available is Tuesday 12/15/2017 (Cath Flow).  Appointment scheduled and notified Dr. Megan Salon.  Was unable to coordinate replacement PICC Line after this appointment. Alerted Dr. Megan Salon.    Called Ms. [employer] and informed her per Dr. Megan Salon she needs to go to the [facility] ED to have Cath Flow administered in PICC.  If this is unsuccessful she needs to have a replacement PICC inserted.  Patient was receptive and verbalized understanding.     [facility] made aware of this plan for patient to go to the ED for Cath flow and possible PICC replacement.Marland Kitchen  Spoke with Stanton Kidney she verbalized understanding   Called [facility] IR and short stay and cancelled appointments for for next week  Pricilla Riffle RN

## 2017-12-11 NOTE — Telephone Encounter (Signed)
Much thanks.

## 2017-12-11 NOTE — ED Notes (Signed)
IV team at bedside 

## 2017-12-11 NOTE — ED Provider Notes (Signed)
[facility] DEPT Provider Note   CSN: [id number] Arrival date & time: 12/11/17  1150     History   Chief Complaint Chief Complaint  Patient presents with  . Vascular Access Problem    HPI Tami Schmidt is a 84 y.o. female.  HPI Patient presents to the emergency department with possible PICC line issue.  The patient's husband states he was unable to flush the PICC line.  The patient has had no other issues with the PICC line and has no pain in the site of the PICC line.  The patient states that she has had no issue or injury that is occurred with the PICC line since she has been home.  Patient has no complaints at this time otherwise. Past Medical History:  Diagnosis Date  . Arthritis   . Breast cancer ([place])    b/l mastectomies hx  . Cancer [facility])    breast  . 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 Diabetes mellitus    fasting 90-100  . HTN (hypertension) 03/25/2013  . Hx of radiation therapy    breasts hx  . Hypercholesterolemia   . Hypertension   . Lymphedema of arm    left arm  . Type II or unspecified type diabetes mellitus without mention of complication, not stated as uncontrolled 03/25/2013    Patient Active Problem List   Diagnosis Date Noted  . Type 2 diabetes mellitus without complication ([place]) 37/85/8850  . Bacteremia due to methicillin susceptible Staphylococcus aureus (MSSA) 12/02/2017  . Tenosynovitis of left wrist 12/02/2017  . Infection of left wrist ([place]) 11/30/2017  . Primary osteoarthritis of right hip 01/05/2017  . Osteoarthritis of right hip 01/03/2017  . Lymphedema of upper extremity 01/17/2014  . Osteopenia 01/17/2014  . Malignant neoplasm of lower-inner quadrant of left breast in female, estrogen receptor positive ([place]) 06/16/2013  . Type II or unspecified type diabetes mellitus without mention of complication, not stated as uncontrolled 03/25/2013  . HTN (hypertension) 03/25/2013  . Chest wall  recurrence of breast cancer ([place]) 02/21/2013  . Abdominal wall mass 01/14/2013    Past Surgical History:  Procedure Laterality Date  . ABDOMINAL HYSTERECTOMY    . ANKLE SURGERY Right 1995  . APPENDECTOMY    . BREAST SURGERY Bilateral    mastectomy  . HERNIA REPAIR  04-26-2010  . MASS EXCISION Left 01/26/2013   Procedure: EXCISION LEFT CHEST WALL MASS AND LEFT ABDOMNAL WALL MASS;  Surgeon: Harl Bowie, MD;  Location: [facility];  Service: General;  Laterality: Left;  Marland Kitchen MASS EXCISION Left 09/20/2014   Procedure: EXCISION OF LEFT CHEST WALL MASS;  Surgeon: Coralie Keens, MD;  Location: [facility];  Service: General;  Laterality: Left;  . TEE WITHOUT CARDIOVERSION N/A 12/08/2017   Procedure: TRANSESOPHAGEAL ECHOCARDIOGRAM (TEE);  Surgeon: Lelon Perla, MD;  Location[facility] ENDOSCOPY;  Service: Cardiovascular;  Laterality: N/A;  . TOTAL HIP ARTHROPLASTY Right 01/05/2017  . TOTAL HIP ARTHROPLASTY Right 01/05/2017   Procedure: TOTAL HIP ARTHROPLASTY ANTERIOR APPROACH;  Surgeon: Frederik Pear, MD;  Location: [facility];  Service: Orthopedics;  Laterality: Right;     OB History   None      Home Medications    Prior to Admission medications   Medication Sig Start Date End Date Taking? Authorizing Provider  acetaminophen (TYLENOL) 325 MG tablet Take 650 mg by mouth every 6 (six) hours as needed for moderate pain.   Yes [provider]  amLODipine (NORVASC)  10 MG tablet Take 10 mg by mouth daily with breakfast.    Yes [provider]  aspirin EC 81 MG tablet Take 81 mg by mouth daily.   Yes [provider]  ceFAZolin (ANCEF) IVPB Inject 2 g into the vein every 8 (eight) hours for 22 days. Indication: Bacteremia Last Day of Therapy:  12/30/2017 Labs - Once weekly:  CBC/D and BMP, Labs - Every other week:  ESR and CRP 12/08/17 12/30/17 Yes Nettey, Myrlene Broker D, MD  fish oil-omega-3 fatty acids 1000 MG capsule Take 1 g by mouth daily with breakfast.    Yes  [provider]  HUMULIN N 100 UNIT/ML injection Inject 40 Units into the skin daily. 11/29/16  Yes [provider]  losartan-hydrochlorothiazide (HYZAAR) 100-25 MG per tablet Take 1 tablet by mouth daily with breakfast.  04/26/12  Yes [provider]  metFORMIN (GLUCOPHAGE) 500 MG tablet Take 500 mg by mouth daily with breakfast.   Yes [provider]  tamoxifen (NOLVADEX) 20 MG tablet TAKE 1 TABLET BY MOUTH  DAILY Patient taking differently: Take 20 mg by mouth daily.  12/11/17  Yes Magrinat, Virgie Dad, MD  cephALEXin (KEFLEX) 500 MG capsule Take 1 capsule (500 mg total) by mouth 3 (three) times daily. 12/11/17   Michel Bickers, MD    Family History No family history on file.  Social History Social History   Tobacco Use  . Smoking status: Former Smoker    Types: Cigarettes    Last attempt to quit: 04/14/1972    Years since quitting: 45.6  . Smokeless tobacco: Never Used  Substance Use Topics  . Alcohol use: Yes    Comment: occasional  . Drug use: No     Allergies   Bactrim; Lisinopril; and Vasotec   Review of Systems Review of Systems  All other systems negative except as documented in the HPI. All pertinent positives and negatives as reviewed in the HPI. Physical Exam Updated Vital Signs BP (!) 155/69 (BP Location: Right Wrist)   Pulse 89   Temp 98.1 F (36.7 C) (Oral)   Resp 17   Ht _0  (1.676 m)   Wt 93.9 kg   SpO2 98%   BMI 33.41 kg/m   Physical Exam  Constitutional: She is oriented to person, place, and time. She appears well-developed and well-nourished. No distress.  HENT:  Head: Normocephalic and atraumatic.  Eyes: Pupils are equal, round, and reactive to light.  Pulmonary/Chest: Effort normal.  Neurological: She is alert and oriented to person, place, and time.  Skin: Skin is warm and dry.  Psychiatric: She has a normal mood and affect.  Nursing note and vitals reviewed.    ED Treatments / Results  Labs (all  labs ordered are listed, but only abnormal results are displayed) Labs Reviewed - No data to display  EKG None  Radiology No results found.  Procedures Procedures (including critical care time)  Medications Ordered in ED Medications  alteplase (CATHFLO ACTIVASE) injection 2 mg (has no administration in time range)     Initial Impression / Assessment and Plan / ED Course  I have reviewed the triage vital signs and the nursing notes.  Pertinent labs & imaging results that were available during my care of the patient were reviewed by me and considered in my medical decision making (see chart for details).     We were able to flush the PICC line but unable to draw back successfully other than a small amount initially.  The IV team will come and assess the patient's PICC line and use TPA to unclog the tip of the catheter.  Final Clinical Impressions(s) / ED Diagnoses   Final diagnoses:  None    ED Discharge Orders    None       Dalia Heading, PA-C 12/11/17 1326    Pattricia Boss, MD 12/11/17 1550

## 2017-12-11 NOTE — ED Triage Notes (Signed)
Patient sent over to have Cath Flow administered in PICC.  If unsuccessful she needs to have a replacement PICC inserted.

## 2017-12-11 NOTE — Telephone Encounter (Signed)
error 

## 2017-12-11 NOTE — Discharge Instructions (Addendum)
Return here as needed. Follow up with your doctor as needed.

## 2017-12-15 ENCOUNTER — Encounter (HOSPITAL_COMMUNITY): Payer: Medicare Other

## 2017-12-16 ENCOUNTER — Ambulatory Visit (HOSPITAL_COMMUNITY): Payer: Medicare Other

## 2017-12-28 ENCOUNTER — Encounter: Payer: Self-pay | Admitting: Internal Medicine

## 2017-12-28 ENCOUNTER — Ambulatory Visit (INDEPENDENT_AMBULATORY_CARE_PROVIDER_SITE_OTHER): Payer: Medicare Other | Admitting: Internal Medicine

## 2017-12-28 ENCOUNTER — Telehealth: Payer: Self-pay | Admitting: *Deleted

## 2017-12-28 DIAGNOSIS — M009 Pyogenic arthritis, unspecified: Secondary | ICD-10-CM

## 2017-12-28 DIAGNOSIS — B9561 Methicillin susceptible Staphylococcus aureus infection as the cause of diseases classified elsewhere: Secondary | ICD-10-CM

## 2017-12-28 DIAGNOSIS — R7881 Bacteremia: Secondary | ICD-10-CM | POA: Diagnosis not present

## 2017-12-28 MED ORDER — CEPHALEXIN 500 MG PO CAPS: 500.0000 mg | ORAL_CAPSULE | Freq: Three times a day (TID) | ORAL | 0 refills | Status: DC

## 2017-12-28 NOTE — Progress Notes (Signed)
[facility]  Patient Active Problem List   Diagnosis Date Noted  . Bacteremia due to methicillin susceptible Staphylococcus aureus (MSSA) 12/02/2017    Priority: High  . Infection of left wrist ([place]) 11/30/2017    Priority: High  . Type 2 diabetes mellitus without complication ([place]) 42/70/6237  . Tenosynovitis of left wrist 12/02/2017  . Primary osteoarthritis of right hip 01/05/2017  . Osteoarthritis of right hip 01/03/2017  . Lymphedema of upper extremity 01/17/2014  . Osteopenia 01/17/2014  . Malignant neoplasm of lower-inner quadrant of left breast in female, estrogen receptor positive ([place]) 06/16/2013  . Type II or unspecified type diabetes mellitus without mention of complication, not stated as uncontrolled 03/25/2013  . HTN (hypertension) 03/25/2013  . Chest wall recurrence of breast cancer ([place]) 02/21/2013  . Abdominal wall mass 01/14/2013    Patient's Medications  New Prescriptions   No medications on file  Previous Medications   ACETAMINOPHEN (TYLENOL) 325 MG TABLET    Take 650 mg by mouth every 6 (six) hours as needed for moderate pain.   AMLODIPINE (NORVASC) 10 MG TABLET    Take 10 mg by mouth daily with breakfast.    ASPIRIN EC 81 MG TABLET    Take 81 mg by mouth daily.   FISH OIL-OMEGA-3 FATTY ACIDS 1000 MG CAPSULE    Take 1 g by mouth daily with breakfast.    HUMULIN N 100 UNIT/ML INJECTION    Inject 40 Units into the skin daily.   LOSARTAN-HYDROCHLOROTHIAZIDE (HYZAAR) 100-25 MG PER TABLET    Take 1 tablet by mouth daily with breakfast.    METFORMIN (GLUCOPHAGE) 500 MG TABLET    Take 500 mg by mouth daily with breakfast.   TAMOXIFEN (NOLVADEX) 20 MG TABLET    TAKE 1 TABLET BY MOUTH  DAILY  Modified Medications   Modified Medication Previous Medication   CEPHALEXIN (KEFLEX) 500 MG CAPSULE cephALEXin (KEFLEX) 500 MG capsule      Take 1 capsule (500 mg total) by mouth 3 (three) times daily.    Take 1 capsule (500 mg total) by  mouth 3 (three) times daily.  Discontinued Medications   CEFAZOLIN (ANCEF) IVPB    Inject 2 g into the vein every 8 (eight) hours for 22 days. Indication: Bacteremia Last Day of Therapy:  12/30/2017 Labs - Once weekly:  CBC/D and BMP, Labs - Every other week:  ESR and CRP    Subjective: Ms. Jasek is in for her hospital follow-up visit.  She developed MSSA bacteremia complicated by soft tissue infection of her left hand and wrist.  Repeat blood cultures were negative and there was no clinical evidence of endocarditis.  Her TEE was negative.  She was discharged on IV cefazolin and has now completed 4 weeks of therapy.  She is feeling much better.  He has not had any fever, chills or sweats.  She is no longer having any pain in her left hand.  The swelling has improved greatly.  She always has some swelling in her left arm and hand are due to chronic lymphedema related to breast cancer surgery.  She is still bothered by a lot of stiffness and decreased range of motion but states that this is improving with exercises at home.  She has had no problems tolerating her PICC line.  She feels like the IV cefazolin drains her strength.  Review of Systems: Review of Systems  Constitutional: Positive for malaise/fatigue. Negative for  chills, diaphoresis and fever.  Gastrointestinal: Negative for abdominal pain, diarrhea, nausea and vomiting.  Musculoskeletal: Negative for joint pain.  Skin: Negative for rash.    Past Medical History:  Diagnosis Date  . Arthritis   . Breast cancer ([place])    b/l mastectomies hx  . Cancer [facility])    breast  . 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 Diabetes mellitus    fasting 90-100  . HTN (hypertension) 03/25/2013  . Hx of radiation therapy    breasts hx  . Hypercholesterolemia   . Hypertension   . Lymphedema of arm    left arm  . Type II or unspecified type diabetes mellitus without mention of complication, not stated as uncontrolled 03/25/2013     Social History   Tobacco Use  . Smoking status: Former Smoker    Types: Cigarettes    Last attempt to quit: 04/14/1972    Years since quitting: 45.7  . Smokeless tobacco: Never Used  Substance Use Topics  . Alcohol use: Yes    Comment: occasional  . Drug use: No    No family history on file.  Allergies  Allergen Reactions  . Bactrim Swelling    SWELLING OF MOUTH/FACE.  Marland Kitchen Lisinopril Swelling    SWELLING OF MOUTH/FACE.  Marland Kitchen Vasotec Swelling    SWELLING OF MOUTH/FACE.    Objective: Vitals:   12/28/17 1116  BP: (!) 164/72  Pulse: 80  Temp: 97.6 F (36.4 C)  TempSrc: Oral  Weight: 207 lb (93.9 kg)  Height: 5' 6" (1.676 m)   Body mass index is 33.41 kg/m.  Physical Exam  Constitutional:  She is in good spirits.  She looks like she is feeling much better.  Musculoskeletal:  She has lymphedema of her left arm and on the dorsum of her left hand.  She states that this is close to her normal baseline.  She no longer has any pain with palpation.  There is no unusual redness.  She cannot make a complete fist with her left hand.  Skin: No rash noted.  Her right arm PICC site looks good.  Psychiatric: She has a normal mood and affect.    Lab Results Latest sed rate and C-reactive protein on 12/15/2017 were decreased from her hospital stay.  It was still slightly above normal.   Problem List Items Addressed This Visit      High   Bacteremia due to methicillin susceptible Staphylococcus aureus (MSSA)   Relevant Medications   cephALEXin (KEFLEX) 500 MG capsule   Infection of left wrist ([place])    She is improving on therapy for MSSA bacteremia complicated by soft tissue infection of her left hand and wrist.  I will DC cefazolin, have her PICC removed and give her 2 more weeks of oral cephalexin.  She will follow-up in 6 weeks.  I encouraged her to continue her left hand exercises at home.      Relevant Medications   cephALEXin (KEFLEX) 500 MG capsule       Michel Bickers, MD [facility] [place] [phone] pager   [phone] cell 12/28/2017, 11:42 AM

## 2017-12-28 NOTE — Assessment & Plan Note (Signed)
She is improving on therapy for MSSA bacteremia complicated by soft tissue infection of her left hand and wrist.  I will DC cefazolin, have her PICC removed and give her 2 more weeks of oral cephalexin.  She will follow-up in 6 weeks.  I encouraged her to continue her left hand exercises at home.

## 2017-12-28 NOTE — Telephone Encounter (Signed)
RN relayed verbal order per Dr Megan Salon to [facility] [place] to pull PICC 12/29/17 (when her home health nurse was already scheduled to come out). Order repeated and verified. Landis Gandy, RN

## 2018-01-04 ENCOUNTER — Observation Stay (HOSPITAL_COMMUNITY)
Admission: EM | Admit: 2018-01-04 | Discharge: 2018-01-07 | Disposition: A | Discharge status: Home / Self Care | Payer: Medicare Other | Attending: Gastroenterology | Admitting: Gastroenterology

## 2018-01-04 ENCOUNTER — Telehealth: Payer: Self-pay | Admitting: *Deleted

## 2018-01-04 ENCOUNTER — Ambulatory Visit (HOSPITAL_COMMUNITY)
Admission: EM | Admit: 2018-01-04 | Discharge: 2018-01-04 | Disposition: A | Discharge status: Home / Self Care | Payer: Medicare Other | Source: Home / Self Care

## 2018-01-04 DIAGNOSIS — K222 Esophageal obstruction: Secondary | ICD-10-CM | POA: Diagnosis not present

## 2018-01-04 DIAGNOSIS — K5730 Diverticulosis of large intestine without perforation or abscess without bleeding: Secondary | ICD-10-CM | POA: Insufficient documentation

## 2018-01-04 DIAGNOSIS — I491 Atrial premature depolarization: Secondary | ICD-10-CM | POA: Insufficient documentation

## 2018-01-04 DIAGNOSIS — I10 Essential (primary) hypertension: Secondary | ICD-10-CM | POA: Diagnosis not present

## 2018-01-04 DIAGNOSIS — K449 Diaphragmatic hernia without obstruction or gangrene: Secondary | ICD-10-CM | POA: Diagnosis not present

## 2018-01-04 DIAGNOSIS — R531 Weakness: Secondary | ICD-10-CM | POA: Insufficient documentation

## 2018-01-04 DIAGNOSIS — D509 Iron deficiency anemia, unspecified: Secondary | ICD-10-CM | POA: Diagnosis present

## 2018-01-04 DIAGNOSIS — D500 Iron deficiency anemia secondary to blood loss (chronic): Principal | ICD-10-CM | POA: Insufficient documentation

## 2018-01-04 DIAGNOSIS — K625 Hemorrhage of anus and rectum: Secondary | ICD-10-CM | POA: Insufficient documentation

## 2018-01-04 DIAGNOSIS — Z96641 Presence of right artificial hip joint: Secondary | ICD-10-CM | POA: Insufficient documentation

## 2018-01-04 DIAGNOSIS — C7989 Secondary malignant neoplasm of other specified sites: Secondary | ICD-10-CM

## 2018-01-04 DIAGNOSIS — Z881 Allergy status to other antibiotic agents status: Secondary | ICD-10-CM | POA: Insufficient documentation

## 2018-01-04 DIAGNOSIS — B9561 Methicillin susceptible Staphylococcus aureus infection as the cause of diseases classified elsewhere: Secondary | ICD-10-CM | POA: Insufficient documentation

## 2018-01-04 DIAGNOSIS — Z7982 Long term (current) use of aspirin: Secondary | ICD-10-CM | POA: Insufficient documentation

## 2018-01-04 DIAGNOSIS — Z170 Estrogen receptor positive status [ER+]: Secondary | ICD-10-CM | POA: Insufficient documentation

## 2018-01-04 DIAGNOSIS — R5383 Other fatigue: Secondary | ICD-10-CM | POA: Insufficient documentation

## 2018-01-04 DIAGNOSIS — M1990 Unspecified osteoarthritis, unspecified site: Secondary | ICD-10-CM | POA: Insufficient documentation

## 2018-01-04 DIAGNOSIS — Z888 Allergy status to other drugs, medicaments and biological substances status: Secondary | ICD-10-CM | POA: Insufficient documentation

## 2018-01-04 DIAGNOSIS — D123 Benign neoplasm of transverse colon: Secondary | ICD-10-CM | POA: Insufficient documentation

## 2018-01-04 DIAGNOSIS — M7989 Other specified soft tissue disorders: Secondary | ICD-10-CM | POA: Insufficient documentation

## 2018-01-04 DIAGNOSIS — Z853 Personal history of malignant neoplasm of breast: Secondary | ICD-10-CM | POA: Diagnosis not present

## 2018-01-04 DIAGNOSIS — C50919 Malignant neoplasm of unspecified site of unspecified female breast: Secondary | ICD-10-CM | POA: Diagnosis present

## 2018-01-04 DIAGNOSIS — K209 Esophagitis, unspecified: Secondary | ICD-10-CM | POA: Insufficient documentation

## 2018-01-04 DIAGNOSIS — Z90710 Acquired absence of both cervix and uterus: Secondary | ICD-10-CM | POA: Insufficient documentation

## 2018-01-04 DIAGNOSIS — D125 Benign neoplasm of sigmoid colon: Secondary | ICD-10-CM | POA: Insufficient documentation

## 2018-01-04 DIAGNOSIS — E119 Type 2 diabetes mellitus without complications: Secondary | ICD-10-CM | POA: Insufficient documentation

## 2018-01-04 DIAGNOSIS — D649 Anemia, unspecified: Secondary | ICD-10-CM

## 2018-01-04 DIAGNOSIS — C50312 Malignant neoplasm of lower-inner quadrant of left female breast: Secondary | ICD-10-CM | POA: Diagnosis not present

## 2018-01-04 DIAGNOSIS — K921 Melena: Secondary | ICD-10-CM | POA: Diagnosis present

## 2018-01-04 DIAGNOSIS — I890 Lymphedema, not elsewhere classified: Secondary | ICD-10-CM | POA: Insufficient documentation

## 2018-01-04 DIAGNOSIS — Z79899 Other long term (current) drug therapy: Secondary | ICD-10-CM | POA: Insufficient documentation

## 2018-01-04 DIAGNOSIS — Z9013 Acquired absence of bilateral breasts and nipples: Secondary | ICD-10-CM | POA: Insufficient documentation

## 2018-01-04 DIAGNOSIS — Z794 Long term (current) use of insulin: Secondary | ICD-10-CM | POA: Insufficient documentation

## 2018-01-04 DIAGNOSIS — E7800 Pure hypercholesterolemia, unspecified: Secondary | ICD-10-CM | POA: Insufficient documentation

## 2018-01-04 DIAGNOSIS — Z6833 Body mass index (BMI) 33.0-33.9, adult: Secondary | ICD-10-CM | POA: Insufficient documentation

## 2018-01-04 DIAGNOSIS — R7881 Bacteremia: Secondary | ICD-10-CM | POA: Insufficient documentation

## 2018-01-04 DIAGNOSIS — Z882 Allergy status to sulfonamides status: Secondary | ICD-10-CM | POA: Insufficient documentation

## 2018-01-04 DIAGNOSIS — Z923 Personal history of irradiation: Secondary | ICD-10-CM | POA: Insufficient documentation

## 2018-01-04 DIAGNOSIS — M009 Pyogenic arthritis, unspecified: Secondary | ICD-10-CM | POA: Diagnosis present

## 2018-01-04 DIAGNOSIS — R222 Localized swelling, mass and lump, trunk: Secondary | ICD-10-CM | POA: Diagnosis present

## 2018-01-04 DIAGNOSIS — Z87891 Personal history of nicotine dependence: Secondary | ICD-10-CM | POA: Insufficient documentation

## 2018-01-04 DIAGNOSIS — K922 Gastrointestinal hemorrhage, unspecified: Secondary | ICD-10-CM | POA: Diagnosis present

## 2018-01-04 LAB — COMPREHENSIVE METABOLIC PANEL
ALT: 8 U/L (ref 0–44)
AST: 14 U/L — ABNORMAL LOW (ref 15–41)
Albumin: 3.8 g/dL (ref 3.5–5.0)
Alkaline Phosphatase: 43 U/L (ref 38–126)
Anion gap: 11 (ref 5–15)
BUN: 19 mg/dL (ref 8–23)
CALCIUM: 9.3 mg/dL (ref 8.9–10.3)
CO2: 25 mmol/L (ref 22–32)
Chloride: 104 mmol/L (ref 98–111)
Creatinine, Ser: 0.7 mg/dL (ref 0.44–1.00)
Glucose, Bld: 69 mg/dL — ABNORMAL LOW (ref 70–99)
POTASSIUM: 3.5 mmol/L (ref 3.5–5.1)
Sodium: 140 mmol/L (ref 135–145)
TOTAL PROTEIN: 7.5 g/dL (ref 6.5–8.1)
Total Bilirubin: 0.4 mg/dL (ref 0.3–1.2)

## 2018-01-04 LAB — I-STAT TROPONIN, ED: Troponin i, poc: 0 ng/mL (ref 0.00–0.08)

## 2018-01-04 LAB — CBC
HCT: 31.1 % — ABNORMAL LOW (ref 36.0–46.0)
HCT: 26.4 % — ABNORMAL LOW (ref 36.0–46.0)
Hemoglobin: 9.7 g/dL — ABNORMAL LOW (ref 12.0–15.0)
Hemoglobin: 8.3 g/dL — ABNORMAL LOW (ref 12.0–15.0)
MCH: 24.1 pg — ABNORMAL LOW (ref 26.0–34.0)
MCH: 24.1 pg — ABNORMAL LOW (ref 26.0–34.0)
MCHC: 31.2 g/dL (ref 30.0–36.0)
MCHC: 31.4 g/dL (ref 30.0–36.0)
MCV: 77.4 fL — ABNORMAL LOW (ref 78.0–100.0)
MCV: 76.7 fL — ABNORMAL LOW (ref 78.0–100.0)
PLATELETS: 241 10*3/uL (ref 150–400)
Platelets: 219 10*3/uL (ref 150–400)
RBC: 4.02 MIL/uL (ref 3.87–5.11)
RBC: 3.44 MIL/uL — ABNORMAL LOW (ref 3.87–5.11)
RDW: 17.8 % — ABNORMAL HIGH (ref 11.5–15.5)
RDW: 17.7 % — AB (ref 11.5–15.5)
WBC: 7.6 10*3/uL (ref 4.0–10.5)
WBC: 6.8 10*3/uL (ref 4.0–10.5)

## 2018-01-04 LAB — CBG MONITORING, ED
GLUCOSE-CAPILLARY: 100 mg/dL — AB (ref 70–99)
Glucose-Capillary: 67 mg/dL — ABNORMAL LOW (ref 70–99)

## 2018-01-04 LAB — PROTIME-INR
INR: 1.1
Prothrombin Time: 14.1 seconds (ref 11.4–15.2)

## 2018-01-04 LAB — POC OCCULT BLOOD, ED: Fecal Occult Bld: POSITIVE — AB

## 2018-01-04 MED ORDER — SODIUM CHLORIDE 0.9 % IV SOLN
INTRAVENOUS | Status: DC
  Administered 2018-01-04 – 2018-01-07 (×7): via INTRAVENOUS

## 2018-01-04 MED ORDER — SODIUM CHLORIDE 0.9 % IV SOLN
8.0000 mg/h | INTRAVENOUS | Status: DC
  Administered 2018-01-05 (×3): 8 mg/h via INTRAVENOUS
  Filled 2018-01-04 (×8): qty 80

## 2018-01-04 MED ORDER — OMEGA-3-ACID ETHYL ESTERS 1 G PO CAPS
1.0000 g | ORAL_CAPSULE | Freq: Every day | ORAL | Status: DC
  Administered 2018-01-05: 1 g via ORAL
  Filled 2018-01-04 (×2): qty 1

## 2018-01-04 MED ORDER — ONDANSETRON HCL 4 MG/2ML IJ SOLN: 4.0000 mg | Freq: Four times a day (QID) | INTRAMUSCULAR | Status: DC | PRN

## 2018-01-04 MED ORDER — INSULIN ASPART 100 UNIT/ML ~~LOC~~ SOLN
0.0000 [IU] | Freq: Three times a day (TID) | SUBCUTANEOUS | Status: DC
  Administered 2018-01-06: 1 [IU] via SUBCUTANEOUS

## 2018-01-04 MED ORDER — INSULIN ASPART 100 UNIT/ML ~~LOC~~ SOLN: 0.0000 [IU] | Freq: Every day | SUBCUTANEOUS | Status: DC

## 2018-01-04 MED ORDER — ONDANSETRON HCL 4 MG PO TABS: 4.0000 mg | ORAL_TABLET | Freq: Four times a day (QID) | ORAL | Status: DC | PRN

## 2018-01-04 MED ORDER — ASPIRIN EC 81 MG PO TBEC
81.0000 mg | DELAYED_RELEASE_TABLET | Freq: Every day | ORAL | Status: DC
  Filled 2018-01-04: qty 1

## 2018-01-04 MED ORDER — SODIUM CHLORIDE 0.9 % IV SOLN
80.0000 mg | Freq: Once | INTRAVENOUS | Status: AC
  Administered 2018-01-04: 80 mg via INTRAVENOUS
  Filled 2018-01-04: qty 80

## 2018-01-04 MED ORDER — PANTOPRAZOLE SODIUM 40 MG IV SOLR: 40.0000 mg | Freq: Two times a day (BID) | INTRAVENOUS | Status: DC

## 2018-01-04 MED ORDER — TAMOXIFEN CITRATE 10 MG PO TABS
20.0000 mg | ORAL_TABLET | Freq: Every day | ORAL | Status: DC
  Administered 2018-01-05 – 2018-01-07 (×3): 20 mg via ORAL
  Filled 2018-01-04 (×3): qty 2

## 2018-01-04 NOTE — ED Provider Notes (Signed)
[facility] DEPT Provider Note   CSN: [id number] Arrival date & time: 01/04/18  1512     History   Chief Complaint Chief Complaint  Patient presents with  . Fatigue  . dark stools     HPI Tami Schmidt is a 84 y.o. female with history of MSSA bacteremia complicated by soft tissue infection of the left hand status post PICC line antibiotics with negative blood cultures, breast cancer status post mastectomy, hypertension, hyperlipidemia, diabetes on insulin presents to the ER for evaluation of dark stools.  Call PCP and was told to come to the ER.  Onset 2 to 3 days ago.  Stools are very dark, soft, formed.  Associate symptoms include lightheadedness, fatigue, shortness of breath, feeling winded.  She denies any history of ulcers.  No EtOH use.  No heavy use of NSAIDs or aspirin.  Last week she completed her IV antibiotics that were followed by heparin flush.  No other anticoagulants.  She denies associated fevers, chest pain, cough, nausea, vomiting, abdominal pain, dysuria, hematuria, diarrhea.  No previous history of GI bleed.  HPI  Past Medical History:  Diagnosis Date  . Arthritis   . Breast cancer ([place])    b/l mastectomies hx  . Cancer [facility])    breast  . 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 Diabetes mellitus    fasting 90-100  . HTN (hypertension) 03/25/2013  . Hx of radiation therapy    breasts hx  . Hypercholesterolemia   . Hypertension   . Lymphedema of arm    left arm  . Type II or unspecified type diabetes mellitus without mention of complication, not stated as uncontrolled 03/25/2013    Patient Active Problem List   Diagnosis Date Noted  . Melena 01/04/2018  . Type 2 diabetes mellitus without complication ([place]) 51/70/0174  . Bacteremia due to methicillin susceptible Staphylococcus aureus (MSSA) 12/02/2017  . Tenosynovitis of left wrist 12/02/2017  . Infection of left wrist ([place]) 11/30/2017  . Primary  osteoarthritis of right hip 01/05/2017  . Osteoarthritis of right hip 01/03/2017  . Lymphedema of upper extremity 01/17/2014  . Osteopenia 01/17/2014  . Malignant neoplasm of lower-inner quadrant of left breast in female, estrogen receptor positive ([place]) 06/16/2013  . Type II or unspecified type diabetes mellitus without mention of complication, not stated as uncontrolled 03/25/2013  . HTN (hypertension) 03/25/2013  . Chest wall recurrence of breast cancer ([place]) 02/21/2013  . Abdominal wall mass 01/14/2013    Past Surgical History:  Procedure Laterality Date  . ABDOMINAL HYSTERECTOMY    . ANKLE SURGERY Right 1995  . APPENDECTOMY    . BREAST SURGERY Bilateral    mastectomy  . HERNIA REPAIR  04-26-2010  . MASS EXCISION Left 01/26/2013   Procedure: EXCISION LEFT CHEST WALL MASS AND LEFT ABDOMNAL WALL MASS;  Surgeon: Harl Bowie, MD;  Location: [facility];  Service: General;  Laterality: Left;  Marland Kitchen MASS EXCISION Left 09/20/2014   Procedure: EXCISION OF LEFT CHEST WALL MASS;  Surgeon: Coralie Keens, MD;  Location: [facility];  Service: General;  Laterality: Left;  . TEE WITHOUT CARDIOVERSION N/A 12/08/2017   Procedure: TRANSESOPHAGEAL ECHOCARDIOGRAM (TEE);  Surgeon: Lelon Perla, MD;  Location[facility] ENDOSCOPY;  Service: Cardiovascular;  Laterality: N/A;  . TOTAL HIP ARTHROPLASTY Right 01/05/2017  . TOTAL HIP ARTHROPLASTY Right 01/05/2017   Procedure: TOTAL HIP ARTHROPLASTY ANTERIOR APPROACH;  Surgeon: Frederik Pear, MD;  Location: [facility];  Service: Orthopedics;  Laterality: Right;     OB History   None      Home Medications    Prior to Admission medications   Medication Sig Start Date End Date Taking? Authorizing Provider  amLODipine (NORVASC) 10 MG tablet Take 10 mg by mouth daily with breakfast.    Yes [provider]  aspirin EC 81 MG tablet Take 81 mg by mouth daily.   Yes [provider]  cephALEXin (KEFLEX) 500 MG capsule Take 1 capsule (500  mg total) by mouth 3 (three) times daily. 12/28/17  Yes Michel Bickers, MD  fish oil-omega-3 fatty acids 1000 MG capsule Take 1 g by mouth daily with breakfast.    Yes [provider]  HUMULIN N 100 UNIT/ML injection Inject 40 Units into the skin daily. 11/29/16  Yes [provider]  losartan-hydrochlorothiazide (HYZAAR) 100-25 MG per tablet Take 1 tablet by mouth daily with breakfast.  04/26/12  Yes [provider]  metFORMIN (GLUCOPHAGE) 500 MG tablet Take 500 mg by mouth daily with breakfast.   Yes [provider]  tamoxifen (NOLVADEX) 20 MG tablet TAKE 1 TABLET BY MOUTH  DAILY Patient taking differently: Take 20 mg by mouth daily.  12/11/17  Yes Magrinat, Virgie Dad, MD    Family History No family history on file.  Social History Social History   Tobacco Use  . Smoking status: Former Smoker    Types: Cigarettes    Last attempt to quit: 04/14/1972    Years since quitting: 45.7  . Smokeless tobacco: Never Used  Substance Use Topics  . Alcohol use: Yes    Comment: occasional  . Drug use: No     Allergies   Bactrim; Lisinopril; and Vasotec   Review of Systems Review of Systems  Constitutional: Positive for fatigue.  Respiratory: Positive for shortness of breath.   Gastrointestinal: Positive for blood in stool.  All other systems reviewed and are negative.    Physical Exam Updated Vital Signs BP 140/61   Pulse 91   Temp 99 F (37.2 C) (Oral)   Resp 18   SpO2 99%   Physical Exam  Constitutional: She is oriented to person, place, and time. She appears well-developed and well-nourished. No distress.  NAD.  Shivering, states she is very cold.  Nontoxic.  HENT:  Head: Normocephalic and atraumatic.  Right Ear: External ear normal.  Left Ear: External ear normal.  Nose: Nose normal.  Moist mucous membranes.  Eyes: Conjunctivae and EOM are normal. No scleral icterus.  Neck: Normal range of motion. Neck supple.  Cardiovascular: Normal  rate, regular rhythm and normal heart sounds.  1+ radial and DP pulses bilaterally.  No lower extremity edema.  Pulmonary/Chest: Effort normal and breath sounds normal.  Abdominal: Soft. There is no tenderness.  Obese abdomen.  Unable to auscultate bowel sounds.  Negative Murphy's and McBurney's.  No suprapubic or CVA tenderness.  Genitourinary: Rectal exam shows guaiac positive stool.  Genitourinary Comments: Melanotic stool.  Good rectal tone.  Exam performed with EMT at bedside.  Musculoskeletal: Normal range of motion. She exhibits no deformity.  Neurological: She is alert and oriented to person, place, and time.  Skin: Skin is warm and dry. Capillary refill takes less than 2 seconds.  Psychiatric: She has a normal mood and affect. Her behavior is normal. Judgment and thought content normal.  Nursing note and vitals reviewed.    ED Treatments / Results  Labs (all labs ordered are listed, but only abnormal results are displayed)  Labs Reviewed  COMPREHENSIVE METABOLIC PANEL - Abnormal; Notable for the following components:      Result Value   Glucose, Bld 69 (*)    AST 14 (*)    All other components within normal limits  CBC - Abnormal; Notable for the following components:   Hemoglobin 9.7 (*)    HCT 31.1 (*)    MCV 77.4 (*)    MCH 24.1 (*)    RDW 17.8 (*)    All other components within normal limits  POC OCCULT BLOOD, ED - Abnormal; Notable for the following components:   Fecal Occult Bld POSITIVE (*)    All other components within normal limits  CBG MONITORING, ED - Abnormal; Notable for the following components:   Glucose-Capillary 67 (*)    All other components within normal limits  CBG MONITORING, ED - Abnormal; Notable for the following components:   Glucose-Capillary 100 (*)    All other components within normal limits  PROTIME-INR  I-STAT TROPONIN, ED  TYPE AND SCREEN  ABO/RH    EKG None  Radiology No results found.  Procedures Procedures (including  critical care time)  Medications Ordered in ED Medications  pantoprazole (PROTONIX) 80 mg in sodium chloride 0.9 % 100 mL IVPB (has no administration in time range)  pantoprazole (PROTONIX) 80 mg in sodium chloride 0.9 % 250 mL (0.32 mg/mL) infusion (has no administration in time range)  pantoprazole (PROTONIX) injection 40 mg (has no administration in time range)     Initial Impression / Assessment and Plan / ED Course  I have reviewed the triage vital signs and the nursing notes.  Pertinent labs & imaging results that were available during my care of the patient were reviewed by me and considered in my medical decision making (see chart for details).  Clinical Course as of Jan 04 2013  Mon Jan 04, 2018  1900 Hemoglobin(!): 9.7 [CG]  1900 Fecal Occult Blood, POC(!): POSITIVE [CG]    Clinical Course User Index [CG] Kinnie Feil, PA-C    Concern for upper GI bleed leading to symptomatic anemia.  She has no constitutional symptoms, abdominal pain, chest pain.  She is hypoglycemic with CBG 67, she is eating at this time.  I think her shortness of breath and fatigue are from possible low hemoglobin although given age, risk will obtain EKG, troponin, orthostatic vital signs.  Pending labs, DRE.  2006: DRE with melenotic stools, positive hemoccult, hgb 9.7 (10.8 one month ago).  Discussed with Dr Jonelle Sidle who will admit patient for symptomatic anemia and GIB.  Pending GI consult.   2015: Spoke to Dr Bryan Lemma with GI who agrees with admission.   Final Clinical Impressions(s) / ED Diagnoses   Final diagnoses:  Melena  Symptomatic anemia    ED Discharge Orders    None       Arlean Hopping 01/04/18 2014    Lacretia Leigh, MD 01/04/18 2334

## 2018-01-04 NOTE — ED Triage Notes (Signed)
Pt c/o fatigue and dark stools x 3 days. PCP told it wasn't the antibiotic she is taking for her left hand infection.

## 2018-01-04 NOTE — Telephone Encounter (Signed)
Please let her know that I doubt that oral cephalexin is causing her symptoms.  However, if she feels like she is getting worse she has the option of going ahead and stopping cephalexin now.

## 2018-01-04 NOTE — ED Notes (Signed)
Pt states she recently had a PICC removed and is on oral antibiotics for infection in her hand. Pt states for the last three days shes noticed some very dark stool, states shes very very tired when she wants from one room to the next, this is a new problem. Due to pt extensive hx, provider mani states she needs to be seen in the ER. Pt husband agreeable to plan and will drive her to [facility]. Pt in NAD at this time and denies pain.

## 2018-01-04 NOTE — ED Notes (Signed)
ED TO INPATIENT HANDOFF REPORT  Name/Age/Gender Tami Schmidt 84 y.o. female  Code Status Code Status History    Date Active Date Inactive Code Status Order ID Comments User Context   11/30/2017 2330 12/08/2017 1946 Full Code [id number]  Elwyn Reach, MD Inpatient   01/05/2017 2013 01/08/2017 1828 Full Code [id number]  Leighton Parody, PA-C Inpatient      Home/SNF/Other Home  Chief Complaint hemoglobin low  Level of Care/Admitting Diagnosis ED Disposition    ED Disposition Condition [facility] [facility] Area: [facility] [[id number]]  Level of Care: Telemetry [5]  Admit to tele based on following criteria: Other see comments  Comments: gi bleed  Diagnosis: GI bleed [790240]  Admitting Physician: Elwyn Reach [[id number]]  Attending Physician: Elwyn Reach [[id number]]  Estimated length of stay: past midnight tomorrow  Certification:: I certify this patient will need inpatient services for at least 2 midnights  PT Class (Do Not Modify): Inpatient [101]  PT Acc Code (Do Not Modify): Private [1]       Medical History Past Medical History:  Diagnosis Date  . Arthritis   . Breast cancer ([place])    b/l mastectomies hx  . Cancer [facility])    breast  . 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 Diabetes mellitus    fasting 90-100  . HTN (hypertension) 03/25/2013  . Hx of radiation therapy    breasts hx  . Hypercholesterolemia   . Hypertension   . Lymphedema of arm    left arm  . Type II or unspecified type diabetes mellitus without mention of complication, not stated as uncontrolled 03/25/2013    Allergies Allergies  Allergen Reactions  . Bactrim Swelling    SWELLING OF MOUTH/FACE.  Marland Kitchen Lisinopril Swelling    SWELLING OF MOUTH/FACE.  Marland Kitchen Vasotec Swelling    SWELLING OF MOUTH/FACE.    IV Location/Drains/Wounds Patient Lines/Drains/Airways Status   Active Line/Drains/Airways    Name:   Placement date:   Placement time:   Site:    Days:   Peripheral IV 01/04/18 Right;Anterior Forearm   01/04/18    1834    Forearm   less than 1   PICC Single Lumen 12/08/17 PICC Right Brachial 37 cm 0 cm   12/08/17    1255    Brachial   27   Incision (Closed) 09/20/14 Chest Other (Comment)   09/20/14    1026     1202   Incision (Closed) 01/05/17 Hip Right   01/05/17    1332     364          Labs/Imaging Results for orders placed or performed during the hospital encounter of 01/04/18 (from the past 48 hour(s))  Comprehensive metabolic panel     Status: Abnormal   Collection Time: 01/04/18  4:25 PM  Result Value Ref Range   Sodium 140 135 - 145 mmol/L   Potassium 3.5 3.5 - 5.1 mmol/L   Chloride 104 98 - 111 mmol/L   CO2 25 22 - 32 mmol/L   Glucose, Bld 69 (L) 70 - 99 mg/dL   BUN 19 8 - 23 mg/dL   Creatinine, Ser 0.70 0.44 - 1.00 mg/dL   Calcium 9.3 8.9 - 10.3 mg/dL   Total Protein 7.5 6.5 - 8.1 g/dL   Albumin 3.8 3.5 - 5.0 g/dL   AST 14 (L) 15 - 41 U/L   ALT 8 0 - 44 U/L   Alkaline Phosphatase  43 38 - 126 U/L   Total Bilirubin 0.4 0.3 - 1.2 mg/dL   GFR calc non Af Amer >60 >60 mL/min   GFR calc Af Amer >60 >60 mL/min    Comment: (NOTE) The eGFR has been calculated using the CKD EPI equation. This calculation has not been validated in all clinical situations. eGFR's persistently <60 mL/min signify possible Chronic Kidney Disease.    Anion gap 11 5 - 15    Comment: Performed at [facility], [place] [address] [place], [place] [address]  CBC     Status: Abnormal   Collection Time: 01/04/18  4:25 PM  Result Value Ref Range   WBC 7.6 4.0 - 10.5 K/uL   RBC 4.02 3.87 - 5.11 MIL/uL   Hemoglobin 9.7 (L) 12.0 - 15.0 g/dL   HCT 31.1 (L) 36.0 - 46.0 %   MCV 77.4 (L) 78.0 - 100.0 fL   MCH 24.1 (L) 26.0 - 34.0 pg   MCHC 31.2 30.0 - 36.0 g/dL   RDW 17.8 (H) 11.5 - 15.5 %   Platelets 219 150 - 400 K/uL    Comment: REPEATED TO VERIFY SPECIMEN CHECKED FOR CLOTS PLATELET COUNT CONFIRMED BY SMEAR Performed at  [facility] [address], [place], [place] [address]   Type and screen [place]     Status: None (Preliminary result)   Collection Time: 01/04/18  4:26 PM  Result Value Ref Range   ABO/RH(D) B POS    Antibody Screen PENDING    Sample Expiration      01/07/2018 Performed at [facility], [place] [address] [place], [place] [address]   CBG monitoring, ED     Status: Abnormal   Collection Time: 01/04/18  5:22 PM  Result Value Ref Range   Glucose-Capillary 67 (L) 70 - 99 mg/dL  POC CBG, ED     Status: Abnormal   Collection Time: 01/04/18  6:32 PM  Result Value Ref Range   Glucose-Capillary 100 (H) 70 - 99 mg/dL  POC occult blood, ED     Status: Abnormal   Collection Time: 01/04/18  6:56 PM  Result Value Ref Range   Fecal Occult Bld POSITIVE (A) NEGATIVE  Protime-INR     Status: None   Collection Time: 01/04/18  7:03 PM  Result Value Ref Range   Prothrombin Time 14.1 11.4 - 15.2 seconds   INR 1.10     Comment: Performed at [facility], [place] [address] [place], [place] [address]  I-Stat Troponin, ED (not at [facility])     Status: None   Collection Time: 01/04/18  7:08 PM  Result Value Ref Range   Troponin i, poc 0.00 0.00 - 0.08 ng/mL   Comment 3            Comment: Due to the release kinetics of cTnI, a negative result within the first hours of the onset of symptoms does not rule out myocardial infarction with certainty. If myocardial infarction is still suspected, repeat the test at appropriate intervals.    No results found.  Pending Labs Unresulted Labs (From admission, onward)    Start     Ordered   01/04/18 1627  ABO/Rh  Once,   R     01/04/18 1627   Signed and Held  Comprehensive metabolic panel  Tomorrow morning,   R     Signed and Held   Signed and Held  CBC  Now then every 6  hours,   R     Signed and Held          Vitals/Pain Today's Vitals   01/04/18 2000 01/04/18 2030 01/04/18  2100 01/04/18 2130  BP: (!) 128/59 (!) 131/48 (!) 121/44 (!) 113/44  Pulse: 85 86 86 84  Resp: 16 17 (!) 22 19  Temp:      TempSrc:      SpO2: 99% 100% 99% 98%  PainSc:        Isolation Precautions No active isolations  Medications Medications  pantoprazole (PROTONIX) 80 mg in sodium chloride 0.9 % 100 mL IVPB (has no administration in time range)  pantoprazole (PROTONIX) 80 mg in sodium chloride 0.9 % 250 mL (0.32 mg/mL) infusion (has no administration in time range)  pantoprazole (PROTONIX) injection 40 mg (has no administration in time range)    Mobility walks

## 2018-01-04 NOTE — Telephone Encounter (Signed)
I will review the ED providers assessment and plan once she has been evaluated.

## 2018-01-04 NOTE — H&P (Signed)
History and Physical   TINSLEE KLARE BLT:[id number] DOB: June 18, 1932 DOA: 01/04/2018  Referring MD/NP/PA: Vivi Martens, MD  PCP: Donald Prose, MD   Outpatient Specialists: None  Patient coming from: Home  Chief Complaint: Melena  HPI: Tami Schmidt is a 84 y.o. female with medical history significant of breast cancer, status post bilateral mastectomies, diabetes, hypertension, morbid obesity, bilateral lymphedema and osteoarthritis who presented to the ER with weakness as well as black stools for 2 days.  Patient has no prior GI bleed.  She denied taking nonsteroidal anti-inflammatory agents.  She denied any nausea vomiting abdominal pain.  Patient has no bright red blood per rectum and no hematemesis.  She has colonoscopy 10 years ago was the last time.  Per patient it was normal colonoscopy then.  She has not had any issues since then.  In the ER patient was found to have black stools which is stool guaiac positive.  She is therefore being admitted to the hospital for management of GI bleed most likely upper GI bleed.  ED Course: Patient's vitals are stable with a temperature of 99, blood pressure 160/59, her pulse is 98 respiratory rate of 98.  White count 7.6 hemoglobin 9.7 with platelet 219.  Glucose is 69 otherwise electrolytes are all within normal.  PT is 14.1 INR 1.1.  Fetal occult blood test was positive x2.  Patient has been typed and crossmatched in the ER and she is being admitted to the hospital with a diagnosis of GI bleed.  GI has been consulted.  Review of Systems: As per HPI otherwise 10 point review of systems negative.    Past Medical History:  Diagnosis Date  . Arthritis   . Breast cancer ([place])    b/l mastectomies hx  . Cancer [facility])    breast  . 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 Diabetes mellitus    fasting 90-100  . HTN (hypertension) 03/25/2013  . Hx of radiation therapy    breasts hx  . Hypercholesterolemia   . Hypertension   .  Lymphedema of arm    left arm  . Type II or unspecified type diabetes mellitus without mention of complication, not stated as uncontrolled 03/25/2013    Past Surgical History:  Procedure Laterality Date  . ABDOMINAL HYSTERECTOMY    . ANKLE SURGERY Right 1995  . APPENDECTOMY    . BREAST SURGERY Bilateral    mastectomy  . HERNIA REPAIR  04-26-2010  . MASS EXCISION Left 01/26/2013   Procedure: EXCISION LEFT CHEST WALL MASS AND LEFT ABDOMNAL WALL MASS;  Surgeon: Harl Bowie, MD;  Location: [facility];  Service: General;  Laterality: Left;  Marland Kitchen MASS EXCISION Left 09/20/2014   Procedure: EXCISION OF LEFT CHEST WALL MASS;  Surgeon: Coralie Keens, MD;  Location: [place];  Service: General;  Laterality: Left;  . TEE WITHOUT CARDIOVERSION N/A 12/08/2017   Procedure: TRANSESOPHAGEAL ECHOCARDIOGRAM (TEE);  Surgeon: Lelon Perla, MD;  Location[facility]COPY;  Service: Cardiovascular;  Laterality: N/A;  . TOTAL HIP ARTHROPLASTY Right 01/05/2017  . TOTAL HIP ARTHROPLASTY Right 01/05/2017   Procedure: TOTAL HIP ARTHROPLASTY ANTERIOR APPROACH;  Surgeon: Frederik Pear, MD;  Location: [place];  Service: Orthopedics;  Laterality: Right;     reports that she quit smoking about 45 years ago. Her smoking use included cigarettes. She has never used smokeless tobacco. She reports that she drinks alcohol. She reports that she does not use drugs.  Allergies  Allergen Reactions  .  Bactrim Swelling    SWELLING OF MOUTH/FACE.  Marland Kitchen Lisinopril Swelling    SWELLING OF MOUTH/FACE.  Marland Kitchen Vasotec Swelling    SWELLING OF MOUTH/FACE.    No family history on file.   Prior to Admission medications   Medication Sig Start Date End Date Taking? Authorizing Provider  amLODipine (NORVASC) 10 MG tablet Take 10 mg by mouth daily with breakfast.    Yes [provider]  aspirin EC 81 MG tablet Take 81 mg by mouth daily.   Yes [provider]  cephALEXin (KEFLEX) 500 MG capsule Take 1  capsule (500 mg total) by mouth 3 (three) times daily. 12/28/17  Yes Michel Bickers, MD  fish oil-omega-3 fatty acids 1000 MG capsule Take 1 g by mouth daily with breakfast.    Yes [provider]  HUMULIN N 100 UNIT/ML injection Inject 40 Units into the skin daily. 11/29/16  Yes [provider]  losartan-hydrochlorothiazide (HYZAAR) 100-25 MG per tablet Take 1 tablet by mouth daily with breakfast.  04/26/12  Yes [provider]  metFORMIN (GLUCOPHAGE) 500 MG tablet Take 500 mg by mouth daily with breakfast.   Yes [provider]  tamoxifen (NOLVADEX) 20 MG tablet TAKE 1 TABLET BY MOUTH  DAILY Patient taking differently: Take 20 mg by mouth daily.  12/11/17  Yes Magrinat, Virgie Dad, MD    Physical Exam: Vitals:   01/04/18 1535 01/04/18 1736 01/04/18 1739 01/04/18 1800  BP: (!) 142/61 (!) 160/59  140/61  Pulse: 84  98 91  Resp: 18   18  Temp: 99 F (37.2 C)     TempSrc: Oral     SpO2: 98%  100% 99%      Constitutional: NAD, calm, comfortable Vitals:   01/04/18 1535 01/04/18 1736 01/04/18 1739 01/04/18 1800  BP: (!) 142/61 (!) 160/59  140/61  Pulse: 84  98 91  Resp: 18   18  Temp: 99 F (37.2 C)     TempSrc: Oral     SpO2: 98%  100% 99%   Eyes: PERRL, lids and conjunctivae normal ENMT: Mucous membranes are moist. Posterior pharynx clear of any exudate or lesions.Normal dentition.  Neck: normal, supple, no masses, no thyromegaly Respiratory: clear to auscultation bilaterally, no wheezing, no crackles. Normal respiratory effort. No accessory muscle use.  Cardiovascular: Regular rate and rhythm, no murmurs / rubs / gallops. No extremity edema. 2+ pedal pulses. No carotid bruits.  Abdomen: no tenderness, no masses palpated. No hepatosplenomegaly. Bowel sounds positive.  Musculoskeletal: no clubbing / cyanosis. No joint deformity upper and lower extremities. Good ROM, no contractures. Normal muscle tone.  Skin: no rashes, lesions, ulcers. No  induration Neurologic: CN 2-12 grossly intact. Sensation intact, DTR normal. Strength 5/5 in all 4.  Psychiatric: Normal judgment and insight. Alert and oriented x 3. Normal mood.     Labs on Admission: I have personally reviewed following labs and imaging studies  CBC: Recent Labs  Lab 01/04/18 1625  WBC 7.6  HGB 9.7*  HCT 31.1*  MCV 77.4*  PLT 782   Basic Metabolic Panel: Recent Labs  Lab 01/04/18 1625  NA 140  K 3.5  CL 104  CO2 25  GLUCOSE 69*  BUN 19  CREATININE 0.70  CALCIUM 9.3   GFR: Estimated Creatinine Clearance: 61.5 mL/min (by C-G formula based on SCr of 0.7 mg/dL). Liver Function Tests: Recent Labs  Lab 01/04/18 1625  AST 14*  ALT 8  ALKPHOS 43  BILITOT 0.4  PROT 7.5  ALBUMIN 3.8   No results for input(s): LIPASE, AMYLASE in the last 168 hours. No results for input(s): AMMONIA in the last 168 hours. Coagulation Profile: Recent Labs  Lab 01/04/18 1903  INR 1.10   Cardiac Enzymes: No results for input(s): CKTOTAL, CKMB, CKMBINDEX, TROPONINI in the last 168 hours. BNP (last 3 results) No results for input(s): PROBNP in the last 8760 hours. HbA1C: No results for input(s): HGBA1C in the last 72 hours. CBG: Recent Labs  Lab 01/04/18 1722 01/04/18 1832  GLUCAP 67* 100*   Lipid Profile: No results for input(s): CHOL, HDL, LDLCALC, TRIG, CHOLHDL, LDLDIRECT in the last 72 hours. Thyroid Function Tests: No results for input(s): TSH, T4TOTAL, FREET4, T3FREE, THYROIDAB in the last 72 hours. Anemia Panel: No results for input(s): VITAMINB12, FOLATE, FERRITIN, TIBC, IRON, RETICCTPCT in the last 72 hours. Urine analysis:    Component Value Date/Time   COLORURINE YELLOW 12/02/2017 0615   APPEARANCEUR HAZY (A) 12/02/2017 0615   LABSPEC 1.017 12/02/2017 0615   PHURINE 6.0 12/02/2017 0615   GLUCOSEU NEGATIVE 12/02/2017 0615   HGBUR SMALL (A) 12/02/2017 0615   BILIRUBINUR NEGATIVE 12/02/2017 0615   KETONESUR NEGATIVE 12/02/2017 0615    PROTEINUR NEGATIVE 12/02/2017 0615   UROBILINOGEN 0.2 04/26/2010 0840   NITRITE NEGATIVE 12/02/2017 0615   LEUKOCYTESUR NEGATIVE 12/02/2017 0615   Sepsis Labs: @LABRCNTIP (procalcitonin:4,lacticidven:4) )No results found for this or any previous visit (from the past 240 hour(s)).   Radiological Exams on Admission: No results found.  EKG: Independently reviewed.  It shows normal sinus rhythm with a rate of 92, no specific ST changes.  Assessment/Plan Principal Problem:   Melena Active Problems:   Abdominal wall mass   Chest wall recurrence of breast cancer (HCC)   HTN (hypertension)   Type 2 diabetes mellitus without complication ([place])     #1 melena: Patient has no prior GI bleed.  We will admit the patient and start IV Protonix.  Monitor on telemetry.  GI consultation.  Patient will be put on clear liquid diet.  Avoid nonsteroidal anti-inflammatory agents and other medications that may cause more bleed.  Hold antihypertensives for now.  IV fluids to be given.  #2 hypertension: Holding medications temporarily.  #3 diabetes: Blood sugar appears well controlled.  Continue close monitoring.  #4 history of breast cancer: Appears controlled.  Continue tamoxifen.  #5 morbid obesity: Dietary counseling given.    DVT prophylaxis: SCD Code Status: Full code Family Communication: Patient's husband in the room Disposition Plan: Home Consults called: Gastroenterology, Dr. Bryan Lemma  Admission status: inpatient   Severity of Illness: The appropriate patient status for this patient is INPATIENT. Inpatient status is judged to be reasonable and necessary in order to provide the required intensity of service to ensure the patient's safety. The patient's presenting symptoms, physical exam findings, and initial radiographic and laboratory data in the context of their chronic comorbidities is felt to place them at high risk for further clinical deterioration. Furthermore, it is not  anticipated that the patient will be medically stable for discharge from the hospital within 2 midnights of admission. The following factors support the patient status of inpatient.   " The patient's presenting symptoms include melena. " The worrisome physical exam findings include mild tenderness of the lower abdomen. " The initial radiographic and laboratory data are worrisome because of guaiac positive stools. " The chronic co-morbidities include history of breast cancer.   * I certify that at the point of admission it is my clinical judgment that  the patient will require inpatient hospital care spanning beyond 2 midnights from the point of admission due to high intensity of service, high risk for further deterioration and high frequency of surveillance required.Barbette Merino MD [facility] Pager [phone]  If 7PM-7AM, please contact night-coverage [email or web address] Password [email or web address]  01/04/2018, 8:16 PM

## 2018-01-04 NOTE — Telephone Encounter (Signed)
She went to Urgent care, was directed to [facility] emergency for evaluation.

## 2018-01-04 NOTE — Telephone Encounter (Signed)
Patient called, concerned that her new antibiotic is making her feel tired.  She reports 3 days of charcoal colored stools in combination with light headedness and feeling tired walking from one room to another.  RN advised patient to be seen today to make sure she does not have active bleeding. She will go to [facility] on [address], as it is closest. She would still like Dr Hale Bogus advice to make sure that the keflex is not causing these symptoms. Landis Gandy, RN

## 2018-01-05 ENCOUNTER — Encounter (HOSPITAL_COMMUNITY): Payer: Self-pay

## 2018-01-05 ENCOUNTER — Other Ambulatory Visit: Payer: Self-pay

## 2018-01-05 DIAGNOSIS — K922 Gastrointestinal hemorrhage, unspecified: Secondary | ICD-10-CM | POA: Diagnosis not present

## 2018-01-05 DIAGNOSIS — D509 Iron deficiency anemia, unspecified: Secondary | ICD-10-CM | POA: Diagnosis not present

## 2018-01-05 DIAGNOSIS — L0889 Other specified local infections of the skin and subcutaneous tissue: Secondary | ICD-10-CM | POA: Diagnosis not present

## 2018-01-05 DIAGNOSIS — Z881 Allergy status to other antibiotic agents status: Secondary | ICD-10-CM

## 2018-01-05 DIAGNOSIS — B9561 Methicillin susceptible Staphylococcus aureus infection as the cause of diseases classified elsewhere: Secondary | ICD-10-CM

## 2018-01-05 DIAGNOSIS — Z888 Allergy status to other drugs, medicaments and biological substances status: Secondary | ICD-10-CM

## 2018-01-05 DIAGNOSIS — M009 Pyogenic arthritis, unspecified: Secondary | ICD-10-CM

## 2018-01-05 DIAGNOSIS — K921 Melena: Secondary | ICD-10-CM | POA: Diagnosis not present

## 2018-01-05 LAB — ABO/RH: ABO/RH(D): B POS

## 2018-01-05 LAB — CBC
HCT: 25.5 % — ABNORMAL LOW (ref 36.0–46.0)
HCT: 26.7 % — ABNORMAL LOW (ref 36.0–46.0)
HEMATOCRIT: 25.5 % — AB (ref 36.0–46.0)
HEMOGLOBIN: 8.2 g/dL — AB (ref 12.0–15.0)
HEMOGLOBIN: 8.5 g/dL — AB (ref 12.0–15.0)
Hemoglobin: 8.2 g/dL — ABNORMAL LOW (ref 12.0–15.0)
MCH: 24.7 pg — ABNORMAL LOW (ref 26.0–34.0)
MCH: 24 pg — ABNORMAL LOW (ref 26.0–34.0)
MCH: 24.5 pg — AB (ref 26.0–34.0)
MCHC: 32.2 g/dL (ref 30.0–36.0)
MCHC: 32.2 g/dL (ref 30.0–36.0)
MCHC: 31.8 g/dL (ref 30.0–36.0)
MCV: 76.8 fL — AB (ref 78.0–100.0)
MCV: 74.8 fL — AB (ref 78.0–100.0)
MCV: 76.9 fL — ABNORMAL LOW (ref 78.0–100.0)
PLATELETS: 251 10*3/uL (ref 150–400)
Platelets: 238 10*3/uL (ref 150–400)
Platelets: 250 10*3/uL (ref 150–400)
RBC: 3.32 MIL/uL — AB (ref 3.87–5.11)
RBC: 3.41 MIL/uL — ABNORMAL LOW (ref 3.87–5.11)
RBC: 3.47 MIL/uL — ABNORMAL LOW (ref 3.87–5.11)
RDW: 17.9 % — ABNORMAL HIGH (ref 11.5–15.5)
RDW: 17.6 % — ABNORMAL HIGH (ref 11.5–15.5)
RDW: 17.7 % — ABNORMAL HIGH (ref 11.5–15.5)
WBC: 6.3 10*3/uL (ref 4.0–10.5)
WBC: 6.1 10*3/uL (ref 4.0–10.5)
WBC: 5.2 10*3/uL (ref 4.0–10.5)

## 2018-01-05 LAB — COMPREHENSIVE METABOLIC PANEL
ALT: 7 U/L (ref 0–44)
ANION GAP: 8 (ref 5–15)
AST: 12 U/L — ABNORMAL LOW (ref 15–41)
Albumin: 3 g/dL — ABNORMAL LOW (ref 3.5–5.0)
Alkaline Phosphatase: 34 U/L — ABNORMAL LOW (ref 38–126)
BUN: 15 mg/dL (ref 8–23)
CHLORIDE: 108 mmol/L (ref 98–111)
CO2: 25 mmol/L (ref 22–32)
Calcium: 8.5 mg/dL — ABNORMAL LOW (ref 8.9–10.3)
Creatinine, Ser: 0.58 mg/dL (ref 0.44–1.00)
GFR calc non Af Amer: >60 mL/min (ref >60)
Glucose, Bld: 65 mg/dL — ABNORMAL LOW (ref 70–99)
Potassium: 3.5 mmol/L (ref 3.5–5.1)
SODIUM: 141 mmol/L (ref 135–145)
Total Bilirubin: 0.5 mg/dL (ref 0.3–1.2)
Total Protein: 6 g/dL — ABNORMAL LOW (ref 6.5–8.1)

## 2018-01-05 LAB — RETICULOCYTES
RBC.: 3.32 MIL/uL — AB (ref 3.87–5.11)
RETIC CT PCT: 2.2 % (ref 0.4–3.1)
Retic Count, Absolute: 73 10*3/uL (ref 19.0–186.0)

## 2018-01-05 LAB — GLUCOSE, CAPILLARY
GLUCOSE-CAPILLARY: 89 mg/dL (ref 70–99)
Glucose-Capillary: 115 mg/dL — ABNORMAL HIGH (ref 70–99)
Glucose-Capillary: 61 mg/dL — ABNORMAL LOW (ref 70–99)
Glucose-Capillary: 71 mg/dL (ref 70–99)
Glucose-Capillary: 79 mg/dL (ref 70–99)
Glucose-Capillary: 84 mg/dL (ref 70–99)

## 2018-01-05 LAB — TYPE AND SCREEN
ABO/RH(D): B POS
ANTIBODY SCREEN: NEGATIVE

## 2018-01-05 LAB — IRON AND TIBC
Iron: 21 ug/dL — ABNORMAL LOW (ref 28–170)
Saturation Ratios: 7 % — ABNORMAL LOW (ref 10.4–31.8)
TIBC: 317 ug/dL (ref 250–450)
UIBC: 296 ug/dL

## 2018-01-05 LAB — FERRITIN: Ferritin: 25 ng/mL (ref 11–307)

## 2018-01-05 LAB — VITAMIN B12: Vitamin B-12: 435 pg/mL (ref 180–914)

## 2018-01-05 MED ORDER — SODIUM CHLORIDE 0.9 % IV SOLN
510.0000 mg | Freq: Once | INTRAVENOUS | Status: AC
  Administered 2018-01-05: 510 mg via INTRAVENOUS
  Filled 2018-01-05: qty 17

## 2018-01-05 MED ORDER — CEPHALEXIN 500 MG PO CAPS
500.0000 mg | ORAL_CAPSULE | Freq: Three times a day (TID) | ORAL | Status: DC
  Administered 2018-01-05 – 2018-01-07 (×7): 500 mg via ORAL
  Filled 2018-01-05 (×7): qty 1

## 2018-01-05 NOTE — Progress Notes (Signed)
PROGRESS NOTE [facility]   Tami Schmidt   WUJ:[id number] DOB: 1932-10-07  DOA: 01/04/2018 PCP: Donald Prose, MD   Brief Narrative:  Tami Schmidt is a 84 year old female with medical history of breast cancer status post bilateral mastectomies, diabetes, hypertension morbid obesity and osteoarthritis who presented to the emergency department complaining of weakness and black stools for 2 days prior to admission.  Upon ED evaluation patient hemoglobin was 9.7, guaiac positive x2 otherwise labs unremarkable.  Patient was admitted with working diagnosis of possible upper GI bleed.  Subjective: Patient seen and examined, she has no complaints.  Denies any further bowel movements.  Hemoglobin dropped to 8.2.  No acute events overnight.  Assessment & Plan: Blood loss anemia/iron deficiency anemia Felt to be secondary to GI bleed, FOBT positive.  Hemoglobin dropped from 9.7-8.2.  Patient started on IV Protonix will continue for now.  GI consulted and recommended EGD in a.m.  Continue clear liquid diet.  Avoid NSAID.  Will discontinue aspirin.  Low iron and ferritin levels.  Will give 1 dose of Feraheme.  Will need to start on iron supplementation prior to discharge.  Hypertension BP stable, holding antihypertensive agents given possible bleed to avoid hypotension.  Diabetes mellitus type 2 Glucose stable, continue current regimen.  Bacteremia due to MSSA Patient states Keflex prior to admission, ID recommended to continue antibiotic at this time.  Morbid obesity Lifestyle modification and follow-up as an outpatient.  DVT prophylaxis: SCDs Code Status: Full code Family Communication: None at bedside Disposition Plan: Home likely tomorrow after EGD.  Consultants:   GI -[facility]  Procedures:   None  Antimicrobials: Anti-infectives (From admission, onward)   Start     Dose/Rate Route Frequency Ordered Stop   01/05/18 1400  cephALEXin (KEFLEX) capsule 500 mg     500 mg Oral  Every 8 hours 01/05/18 1040          Objective: Vitals:   01/04/18 2218 01/05/18 0443 01/05/18 0841 01/05/18 1237  BP: (!) 180/70 (!) 145/63  123/61  Pulse: 86 79  72  Resp: (!) 24 16    Temp: 99.6 F (37.6 C) 98.1 F (36.7 C)  99.1 F (37.3 C)  TempSrc: Oral Oral  Oral  SpO2: 99% 100% 97% 97%    Intake/Output Summary (Last 24 hours) at 01/05/2018 1720 Last data filed at 01/05/2018 1600 Gross per 24 hour  Intake 1341.37 ml  Output -  Net 1341.37 ml   There were no vitals filed for this visit.  Examination:  General exam: Appears calm and comfortable  Respiratory system: Clear to auscultation. No wheezes,crackle or rhonchi Cardiovascular system: S1 & S2 heard, RRR. No JVD, murmurs, rubs or gallops Gastrointestinal system: Abdomen is nondistended, soft and nontender.  Central nervous system: Alert and oriented. No focal neurological deficits. Extremities: Chronic lymphedema in the left arm apparently at baseline, good range of motion.  No LE edema Skin: No rash Psychiatry: Judgement and insight appear normal. Mood & affect appropriate.    Data Reviewed: I have personally reviewed following labs and imaging studies  CBC: Recent Labs  Lab 01/04/18 1625 01/04/18 2300 01/05/18 0513 01/05/18 1119 01/05/18 1621  WBC 7.6 6.8 6.3 6.1 5.2  HGB 9.7* 8.3* 8.2* 8.2* 8.5*  HCT 31.1* 26.4* 25.5* 25.5* 26.7*  MCV 77.4* 76.7* 76.8* 74.8* 76.9*  PLT 219 241 238 250 956   Basic Metabolic Panel: Recent Labs  Lab 01/04/18 1625 01/05/18 0513  NA 140 141  K 3.5 3.5  CL 104 108  CO2 25 25  GLUCOSE 69* 65*  BUN 19 15  CREATININE 0.70 0.58  CALCIUM 9.3 8.5*   GFR: Estimated Creatinine Clearance: 61.5 mL/min (by C-G formula based on SCr of 0.58 mg/dL). Liver Function Tests: Recent Labs  Lab 01/04/18 1625 01/05/18 0513  AST 14* 12*  ALT 8 7  ALKPHOS 43 34*  BILITOT 0.4 0.5  PROT 7.5 6.0*  ALBUMIN 3.8 3.0*   No results for input(s): LIPASE, AMYLASE in the last 168  hours. No results for input(s): AMMONIA in the last 168 hours. Coagulation Profile: Recent Labs  Lab 01/04/18 1903  INR 1.10   Cardiac Enzymes: No results for input(s): CKTOTAL, CKMB, CKMBINDEX, TROPONINI in the last 168 hours. BNP (last 3 results) No results for input(s): PROBNP in the last 8760 hours. HbA1C: No results for input(s): HGBA1C in the last 72 hours. CBG: Recent Labs  Lab 01/05/18 0003 01/05/18 0725 01/05/18 0824 01/05/18 1136 01/05/18 1649  GLUCAP 84 61* 89 79 71   Lipid Profile: No results for input(s): CHOL, HDL, LDLCALC, TRIG, CHOLHDL, LDLDIRECT in the last 72 hours. Thyroid Function Tests: No results for input(s): TSH, T4TOTAL, FREET4, T3FREE, THYROIDAB in the last 72 hours. Anemia Panel: Recent Labs    01/05/18 0513  VITAMINB12 435  FERRITIN 25  TIBC 317  IRON 21*  RETICCTPCT 2.2   Sepsis Labs: No results for input(s): PROCALCITON, LATICACIDVEN in the last 168 hours.  No results found for this or any previous visit (from the past 240 hour(s)).    Radiology Studies: No results found.    Scheduled Meds: . cephALEXin  500 mg Oral Q8H  . insulin aspart  0-5 Units Subcutaneous QHS  . insulin aspart  0-9 Units Subcutaneous TID WC  . omega-3 acid ethyl esters  1 g Oral Q breakfast  . [START ON 01/08/2018] pantoprazole  40 mg Intravenous Q12H  . tamoxifen  20 mg Oral Daily   Continuous Infusions: . sodium chloride Stopped (01/05/18 1500)  . pantoprozole (PROTONIX) infusion 8 mg/hr (01/05/18 1223)     LOS: 1 day    Time spent: Total of 25 minutes spent with pt, greater than 50% of which was spent in discussion of  treatment, counseling and coordination of care   Chipper Oman, MD Pager: Text Page via [email or web address]   If 7PM-7AM, please contact night-coverage [email or web address] 01/05/2018, 5:20 PM   Note - This record has been created using [employer]. Chart creation errors have been sought, but may not always have been located. Such  creation errors do not reflect on the standard of medical care.

## 2018-01-05 NOTE — H&P (View-Only) (Signed)
[facility] Consult  Referring Provider: Dr.Silva([facility]) Primary Care Physician:  Donald Prose, MD Primary Gastroenterologist: Sadie Haber  Primary  Reason for Consultation:  Melena, anemia  HPI: Tami Schmidt is a 84 y.o. female was admitted on 01/04/18 with progressively worsening fatigue. She was recently treated for MSSA bacteremia and soft tissue infection of her left hand and wrist. She has completed 4 weeks of IV cefazolin and was transitioned to oral cephalexin. 6 days ago she had an episode of bright red bleeding per rectum mixed with stool, thereafter after regular Bms until 3-4 days ago when she had 2 episodes of dark,tarry, black, sticky stools. Her baselines Hb was 10.8 on 12/01/17 and dropped to 8.2 today. She has not had a BM in 2 days. She has lost about 15 lbs in 6 months and has a decreased appetite. She reports having a colonoscopy in 2011 by Dr.Mann and it was reported as unremarkable. She has had radiation for history of breast cancer and is aware of certain food going down her esophagus but denies difficulty or pain on swallowing. She is on ASA 81 mg daily and denies use of other NSAIDs.   Past Medical History:  Diagnosis Date  . Arthritis   . Breast cancer ([place])    b/l mastectomies hx  . Cancer [facility])    breast  . 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 Diabetes mellitus    fasting 90-100  . HTN (hypertension) 03/25/2013  . Hx of radiation therapy    breasts hx  . Hypercholesterolemia   . Hypertension   . Lymphedema of arm    left arm  . Type II or unspecified type diabetes mellitus without mention of complication, not stated as uncontrolled 03/25/2013    Past Surgical History:  Procedure Laterality Date  . ABDOMINAL HYSTERECTOMY    . ANKLE SURGERY Right 1995  . APPENDECTOMY    . BREAST SURGERY Bilateral    mastectomy  . HERNIA REPAIR  04-26-2010  . MASS EXCISION Left 01/26/2013   Procedure: EXCISION LEFT CHEST WALL  MASS AND LEFT ABDOMNAL WALL MASS;  Surgeon: Harl Bowie, MD;  Location: [place];  Service: General;  Laterality: Left;  Marland Kitchen MASS EXCISION Left 09/20/2014   Procedure: EXCISION OF LEFT CHEST WALL MASS;  Surgeon: Coralie Keens, MD;  Location: [place];  Service: General;  Laterality: Left;  . TEE WITHOUT CARDIOVERSION N/A 12/08/2017   Procedure: TRANSESOPHAGEAL ECHOCARDIOGRAM (TEE);  Surgeon: Lelon Perla, MD;  Location: [facility] ENDOSCOPY;  Service: Cardiovascular;  Laterality: N/A;  . TOTAL HIP ARTHROPLASTY Right 01/05/2017  . TOTAL HIP ARTHROPLASTY Right 01/05/2017   Procedure: TOTAL HIP ARTHROPLASTY ANTERIOR APPROACH;  Surgeon: Frederik Pear, MD;  Location: [place];  Service: Orthopedics;  Laterality: Right;    Prior to Admission medications   Medication Sig Start Date End Date Taking? Authorizing Provider  amLODipine (NORVASC) 10 MG tablet Take 10 mg by mouth daily with breakfast.    Yes [provider]  aspirin EC 81 MG tablet Take 81 mg by mouth daily.   Yes [provider]  cephALEXin (KEFLEX) 500 MG capsule Take 1 capsule (500 mg total) by mouth 3 (three) times daily. 12/28/17  Yes Michel Bickers, MD  fish oil-omega-3 fatty acids 1000 MG capsule Take 1 g by mouth daily with breakfast.    Yes [provider]  HUMULIN N 100 UNIT/ML injection Inject 40 Units into the skin daily. 11/29/16  Yes [provider]  losartan-hydrochlorothiazide (HYZAAR) 100-25 MG per tablet Take 1 tablet by mouth daily with breakfast.  04/26/12  Yes [provider]  metFORMIN (GLUCOPHAGE) 500 MG tablet Take 500 mg by mouth daily with breakfast.   Yes [provider]  tamoxifen (NOLVADEX) 20 MG tablet TAKE 1 TABLET BY MOUTH  DAILY Patient taking differently: Take 20 mg by mouth daily.  12/11/17  Yes Magrinat, Virgie Dad, MD    Current Facility-Administered Medications  Medication Dose Route Frequency Provider Last Rate Last Dose  . 0.9 %  sodium  chloride infusion   Intravenous Continuous Gala Romney L, MD 100 mL/hr at 01/05/18 1002    . cephALEXin (KEFLEX) capsule 500 mg  500 mg Oral Q8H Michel Bickers, MD      . ferumoxytol [facility]) 510 mg in sodium chloride 0.9 % 100 mL IVPB  510 mg Intravenous Once Patrecia Pour, Christean Grief, MD      . insulin aspart (novoLOG) injection 0-5 Units  0-5 Units Subcutaneous QHS Garba, Mohammad L, MD      . insulin aspart (novoLOG) injection 0-9 Units  0-9 Units Subcutaneous TID WC Garba, Mohammad L, MD      . omega-3 acid ethyl esters (LOVAZA) capsule 1 g  1 g Oral Q breakfast Gala Romney L, MD   1 g at 01/05/18 1004  . ondansetron (ZOFRAN) tablet 4 mg  4 mg Oral Q6H PRN Elwyn Reach, MD       Or  . ondansetron (ZOFRAN) injection 4 mg  4 mg Intravenous Q6H PRN Gala Romney L, MD      . pantoprazole (PROTONIX) 80 mg in sodium chloride 0.9 % 250 mL (0.32 mg/mL) infusion  8 mg/hr Intravenous Continuous Gala Romney L, MD 25 mL/hr at 01/05/18 1002 8 mg/hr at 01/05/18 1002  . [START ON 01/08/2018] pantoprazole (PROTONIX) injection 40 mg  40 mg Intravenous Q12H Gala Romney L, MD      . tamoxifen (NOLVADEX) tablet 20 mg  20 mg Oral Daily Gala Romney L, MD   20 mg at 01/05/18 1004    Allergies as of 01/04/2018 - Review Complete 01/04/2018  Allergen Reaction Noted  . Bactrim Swelling 06/23/2011  . Lisinopril Swelling 06/23/2011  . Vasotec Swelling 06/23/2011    No family history on file.  Social History   Socioeconomic History  . Marital status: Married    Spouse name: Not on file  . Number of children: Not on file  . Years of education: Not on file  . Highest education level: Not on file  Occupational History  . Occupation: retired Education officer, museum  Social Needs  . Financial resource strain: Not on file  . Food insecurity:    Worry: Not on file    Inability: Not on file  . Transportation needs:    Medical: Not on file    Non-medical: Not on file  Tobacco Use  . Smoking  status: Former Smoker    Types: Cigarettes    Last attempt to quit: 04/14/1972    Years since quitting: 45.7  . Smokeless tobacco: Never Used  Substance and Sexual Activity  . Alcohol use: Yes    Comment: occasional  . Drug use: No  . Sexual activity: Yes    Birth control/protection: Surgical  Lifestyle  . Physical activity:    Days per week: Not on file    Minutes per session: Not on file  . Stress: Not on file  Relationships  . Social connections:    Talks on phone:  Not on file    Gets together: Not on file    Attends religious service: Not on file    Active member of club or organization: Not on file    Attends meetings of clubs or organizations: Not on file    Relationship status: Not on file  . Intimate partner violence:    Fear of current or ex partner: Not on file    Emotionally abused: Not on file    Physically abused: Not on file    Forced sexual activity: Not on file  Other Topics Concern  . Not on file  Social History Narrative  . Not on file    Review of Systems: Positive for: GI: Described in detail in HPI.    Gen: anorexia, fatigue, weakness, malaise, involuntary weight loss,Denies any fever, chills, rigors, night sweats,  and sleep disorder CV: Denies chest pain, angina, palpitations, syncope, orthopnea, PND, peripheral edema, and claudication. Resp: Denies dyspnea, cough, sputum, wheezing, coughing up blood. GU : Denies urinary burning, blood in urine, urinary frequency, urinary hesitancy, nocturnal urination, and urinary incontinence. MS: Denies joint pain or swelling.  Denies muscle weakness, cramps, atrophy.  Derm: Denies rash, itching, oral ulcerations, hives, unhealing ulcers.  Psych: Denies depression, anxiety, memory loss, suicidal ideation, hallucinations,  and confusion. Heme: 1 episode of rectal bleeding, Denies bruising, and enlarged lymph nodes. Neuro:  Denies any headaches, dizziness, paresthesias. Endo:  DM, Denies any problems with thyroid,  adrenal function.  Physical Exam: Vital signs in last 24 hours: Temp:  [98.1 F (36.7 C)-99.6 F (37.6 C)] 98.1 F (36.7 C) (09/24 0443) Pulse Rate:  [79-105] 79 (09/24 0443) Resp:  [16-26] 16 (09/24 0443) BP: (109-180)/(44-76) 145/63 (09/24 0443) SpO2:  [97 %-100 %] 97 % (09/24 0841) Last BM Date: 01/04/18  General:   Alert,  Well-developed, well-nourished, pleasant and cooperative in NAD Head:  Normocephalic and atraumatic. Eyes:  Sclera clear, no icterus.   Mild pallor. Ears:  Normal auditory acuity. Nose:  No deformity, discharge,  or lesions. Mouth:  No deformity or lesions.  Oropharynx pink & moist. Neck:  Supple; no masses or thyromegaly. Lungs:  Clear throughout to auscultation.   No wheezes, crackles, or rhonchi. No acute distress. Heart:  Regular rate and rhythm; no murmurs, clicks, rubs,  or gallops. Extremities:  Left hand bandage Neurologic:  Alert and  oriented x4;  grossly normal neurologically. Skin:  Intact without significant lesions or rashes. Psych:  Alert and cooperative. Normal mood and affect. Abdomen:  Soft, nontender and nondistended. No masses, hepatosplenomegaly or hernias noted. Normal bowel sounds, without guarding, and without rebound.         Lab Results: Recent Labs    01/04/18 2300 01/05/18 0513 01/05/18 1119  WBC 6.8 6.3 6.1  HGB 8.3* 8.2* 8.2*  HCT 26.4* 25.5* 25.5*  PLT 241 238 250   BMET Recent Labs    01/04/18 1625 01/05/18 0513  NA 140 141  K 3.5 3.5  CL 104 108  CO2 25 25  GLUCOSE 69* 65*  BUN 19 15  CREATININE 0.70 0.58  CALCIUM 9.3 8.5*   LFT Recent Labs    01/05/18 0513  PROT 6.0*  ALBUMIN 3.0*  AST 12*  ALT 7  ALKPHOS 34*  BILITOT 0.5   PT/INR Recent Labs    01/04/18 1903  LABPROT 14.1  INR 1.10    Studies/Results: No results found.  Impression: Anemia, Hb 8.2-9.7 Low MCV 74.8 Iron saturation of 7% and ferritin 25 FOBT positive,  history of melena and 1 episode of rectal  bleeding   Plan: Diagnostic EGD in am, continue clear liquid diet, NPO post midnight. Will obtain records of prior colonoscopy from 2011.   LOS: 1 day   [name], MD  01/05/2018, 11:45 AM  Pager [phone] If no answer or after 5 PM call [phone]

## 2018-01-05 NOTE — Plan of Care (Signed)
  Problem: Clinical Measurements: Goal: Will remain free from infection Outcome: Progressing Goal: Diagnostic test results will improve Outcome: Progressing Goal: Respiratory complications will improve Outcome: Progressing Goal: Cardiovascular complication will be avoided Outcome: Progressing   Problem: Activity: Goal: Risk for activity intolerance will decrease Outcome: Progressing   Problem: Coping: Goal: Level of anxiety will decrease Outcome: Progressing   Problem: Elimination: Goal: Will not experience complications related to bowel motility Outcome: Progressing Goal: Will not experience complications related to urinary retention Outcome: Progressing   Problem: Pain Managment: Goal: General experience of comfort will improve Outcome: Progressing   Problem: Safety: Goal: Ability to remain free from injury will improve Outcome: Progressing   Problem: Skin Integrity: Goal: Risk for impaired skin integrity will decrease Outcome: Progressing   

## 2018-01-05 NOTE — Care Management Obs Status (Signed)
[place] NOTIFICATION   Patient Details  Name: Tami Schmidt MRN: [id number] Date of Birth: Oct 02, 1932   Medicare Observation Status Notification Given:       Dessa Phi, RN 01/05/2018, 2:08 PM

## 2018-01-05 NOTE — Progress Notes (Addendum)
Patient ID: Tami Schmidt, female   DOB: January 13, 1933, 84 y.o.   MRN: [id number]         [facility] for Infectious Disease  Date of Admission:  01/04/2018   Total days of antibiotics 35         ASSESSMENT: She has a GI bleed and progressive anemia over the past year.  This is unrelated to her recent infection.  I planned on 1 more week of oral cephalexin (it was stopped on admission last night).  I will restart it now.  PLAN: 1. 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 insulin aspart  0-5 Units Subcutaneous QHS  . insulin aspart  0-9 Units Subcutaneous TID WC  . omega-3 acid ethyl esters  1 g Oral Q breakfast  . [START ON 01/08/2018] pantoprazole  40 mg Intravenous Q12H  . tamoxifen  20 mg Oral Daily   Continuous Infusions: . sodium chloride 100 mL/hr at 01/05/18 1002  . ferumoxytol    . pantoprozole (PROTONIX) infusion 8 mg/hr (01/05/18 0101)   PRN Meds:.ondansetron **OR** ondansetron (ZOFRAN) IV   SUBJECTIVE: Tami Schmidt was hospitalized 5 weeks ago with MSSA bacteremia and soft tissue infection of her left hand and wrist.  She completed 4 weeks of IV cefazolin then transition to oral cephalexin 5-week ago.  She called our office yesterday complaining of progressive weakness, feeling light headed and black-colored stools over the past several days.  She was directed to the emergency department where she was found to have microcytic anemia and melena.  She denies any history of GI bleed previously.  She has had a 5 g drop in her hemoglobin over the past year.  She says that her left hand is feeling much better.  Has increased range of motion and no  further pain.  Review of Systems: Review of Systems  Constitutional: Positive for malaise/fatigue. Negative for chills, diaphoresis and fever.  Gastrointestinal: Positive for blood in stool. Negative for abdominal pain, constipation, diarrhea, nausea and vomiting.  Musculoskeletal: Negative for joint pain.  Neurological: Positive for dizziness.    Allergies  Allergen Reactions  . Bactrim Swelling    SWELLING OF MOUTH/FACE.  Marland Kitchen Lisinopril Swelling    SWELLING OF MOUTH/FACE.  Marland Kitchen Vasotec Swelling    SWELLING OF MOUTH/FACE.    OBJECTIVE: Vitals:   01/04/18 2200 01/04/18 2218 01/05/18 0443 01/05/18 0841  BP: (!) 109/58 (!) 180/70 (!) 145/63   Pulse: 79 86 79   Resp: 17 (!) 24 16   Temp:  99.6 F (37.6 C) 98.1 F (36.7 C)   TempSrc:  Oral Oral   SpO2: 99% 99% 100% 97%   There is no height or weight on file to calculate BMI.  Physical Exam  Constitutional: She is oriented to person, place, and time.  She is alert and appears comfortable resting quietly in bed.  She is in good spirits as usual.  Cardiovascular: Normal rate, regular rhythm and normal heart sounds.  Pulmonary/Chest: Effort normal and breath sounds normal.  Abdominal: Soft. She exhibits no distension. There is no tenderness.  Musculoskeletal: She exhibits edema.  She has chronic lymphedema of her left arm  and hand which is at baseline.  Usual redness, warmth or tenderness of her left hand and wrist.  She has improved range of motion of her left fingers compared with her exam 1 week ago.  Neurological: She is alert and oriented to person, place, and time.  Skin: No rash noted.    Lab Results Lab Results  Component Value Date   WBC 6.3 01/05/2018   HGB 8.2 (L) 01/05/2018   HCT 25.5 (L) 01/05/2018   MCV 76.8 (L) 01/05/2018   PLT 238 01/05/2018    Lab Results  Component Value Date   CREATININE 0.58 01/05/2018   BUN 15 01/05/2018   NA 141 01/05/2018   K 3.5 01/05/2018   CL 108 01/05/2018   CO2 25  01/05/2018    Lab Results  Component Value Date   ALT 7 01/05/2018   AST 12 (L) 01/05/2018   ALKPHOS 34 (L) 01/05/2018   BILITOT 0.5 01/05/2018     Microbiology: No results found for this or any previous visit (from the past 240 hour(s)).  Michel Bickers, MD [facility] [phone] pager   [phone] cell 01/05/2018, 10:39 AM

## 2018-01-05 NOTE — Care Management CC44 (Signed)
Condition Code 44 Documentation Completed  Patient Details  Name: ZANAIYA CALABRIA MRN: [id number] Date of Birth: 05-Sep-1932   Condition Code 44 given:  Yes Patient signature on Condition Code 44 notice:  Yes Documentation of 2 MD's agreement:  Yes Code 44 added to claim:  Yes    Dessa Phi, RN 01/05/2018, 2:08 PM

## 2018-01-05 NOTE — Progress Notes (Signed)
Hypoglycemic Event  CBG: 61  Treatment: 15 GM carbohydrate snack liquid  Symptoms: Hungry  Follow-up CBG: [id number] CBG Result:89 Possible Reasons for Event: Inadequate meal intake  Comments/MD notified:na    [facility]

## 2018-01-05 NOTE — Care Management Obs Status (Signed)
[place] NOTIFICATION   Patient Details  Name: Tami Schmidt MRN: [id number] Date of Birth: [birth year]   Medicare Observation Status Notification Given:       Dessa Phi, RN 01/05/2018, 2:08 PM

## 2018-01-05 NOTE — Consult Note (Signed)
[facility] Consult  Referring Provider: Dr.Silva([facility]) Primary Care Physician:  Donald Prose, MD Primary Gastroenterologist: Sadie Haber  Primary  Reason for Consultation:  Melena, anemia  HPI: Tami Schmidt is a 84 y.o. female was admitted on 01/04/18 with progressively worsening fatigue. She was recently treated for MSSA bacteremia and soft tissue infection of her left hand and wrist. She has completed 4 weeks of IV cefazolin and was transitioned to oral cephalexin. 6 days ago she had an episode of bright red bleeding per rectum mixed with stool, thereafter after regular Bms until 3-4 days ago when she had 2 episodes of dark,tarry, black, sticky stools. Her baselines Hb was 10.8 on 12/01/17 and dropped to 8.2 today. She has not had a BM in 2 days. She has lost about 15 lbs in 6 months and has a decreased appetite. She reports having a colonoscopy in 2011 by Dr.Mann and it was reported as unremarkable. She has had radiation for history of breast cancer and is aware of certain food going down her esophagus but denies difficulty or pain on swallowing. She is on ASA 81 mg daily and denies use of other NSAIDs.   Past Medical History:  Diagnosis Date  . Arthritis   . Breast cancer ([place])    b/l mastectomies hx  . Cancer [facility])    breast  . 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 Diabetes mellitus    fasting 90-100  . HTN (hypertension) 03/25/2013  . Hx of radiation therapy    breasts hx  . Hypercholesterolemia   . Hypertension   . Lymphedema of arm    left arm  . Type II or unspecified type diabetes mellitus without mention of complication, not stated as uncontrolled 03/25/2013    Past Surgical History:  Procedure Laterality Date  . ABDOMINAL HYSTERECTOMY    . ANKLE SURGERY Right 1995  . APPENDECTOMY    . BREAST SURGERY Bilateral    mastectomy  . HERNIA REPAIR  04-26-2010  . MASS EXCISION Left 01/26/2013   Procedure: EXCISION LEFT CHEST WALL  MASS AND LEFT ABDOMNAL WALL MASS;  Surgeon: Harl Bowie, MD;  Location: [place];  Service: General;  Laterality: Left;  Marland Kitchen MASS EXCISION Left 09/20/2014   Procedure: EXCISION OF LEFT CHEST WALL MASS;  Surgeon: Coralie Keens, MD;  Location: [place];  Service: General;  Laterality: Left;  . TEE WITHOUT CARDIOVERSION N/A 12/08/2017   Procedure: TRANSESOPHAGEAL ECHOCARDIOGRAM (TEE);  Surgeon: Lelon Perla, MD;  Location: [facility] ENDOSCOPY;  Service: Cardiovascular;  Laterality: N/A;  . TOTAL HIP ARTHROPLASTY Right 01/05/2017  . TOTAL HIP ARTHROPLASTY Right 01/05/2017   Procedure: TOTAL HIP ARTHROPLASTY ANTERIOR APPROACH;  Surgeon: Frederik Pear, MD;  Location: [place];  Service: Orthopedics;  Laterality: Right;    Prior to Admission medications   Medication Sig Start Date End Date Taking? Authorizing Provider  amLODipine (NORVASC) 10 MG tablet Take 10 mg by mouth daily with breakfast.    Yes [provider]  aspirin EC 81 MG tablet Take 81 mg by mouth daily.   Yes [provider]  cephALEXin (KEFLEX) 500 MG capsule Take 1 capsule (500 mg total) by mouth 3 (three) times daily. 12/28/17  Yes Michel Bickers, MD  fish oil-omega-3 fatty acids 1000 MG capsule Take 1 g by mouth daily with breakfast.    Yes [provider]  HUMULIN N 100 UNIT/ML injection Inject 40 Units into the skin daily. 11/29/16  Yes [provider]  losartan-hydrochlorothiazide (HYZAAR) 100-25 MG per tablet Take 1 tablet by mouth daily with breakfast.  04/26/12  Yes [provider]  metFORMIN (GLUCOPHAGE) 500 MG tablet Take 500 mg by mouth daily with breakfast.   Yes [provider]  tamoxifen (NOLVADEX) 20 MG tablet TAKE 1 TABLET BY MOUTH  DAILY Patient taking differently: Take 20 mg by mouth daily.  12/11/17  Yes Magrinat, Virgie Dad, MD    Current Facility-Administered Medications  Medication Dose Route Frequency Provider Last Rate Last Dose  . 0.9 %  sodium  chloride infusion   Intravenous Continuous Gala Romney L, MD 100 mL/hr at 01/05/18 1002    . cephALEXin (KEFLEX) capsule 500 mg  500 mg Oral Q8H Michel Bickers, MD      . ferumoxytol [facility]) 510 mg in sodium chloride 0.9 % 100 mL IVPB  510 mg Intravenous Once Patrecia Pour, Christean Grief, MD      . insulin aspart (novoLOG) injection 0-5 Units  0-5 Units Subcutaneous QHS Garba, Mohammad L, MD      . insulin aspart (novoLOG) injection 0-9 Units  0-9 Units Subcutaneous TID WC Garba, Mohammad L, MD      . omega-3 acid ethyl esters (LOVAZA) capsule 1 g  1 g Oral Q breakfast Gala Romney L, MD   1 g at 01/05/18 1004  . ondansetron (ZOFRAN) tablet 4 mg  4 mg Oral Q6H PRN Elwyn Reach, MD       Or  . ondansetron (ZOFRAN) injection 4 mg  4 mg Intravenous Q6H PRN Gala Romney L, MD      . pantoprazole (PROTONIX) 80 mg in sodium chloride 0.9 % 250 mL (0.32 mg/mL) infusion  8 mg/hr Intravenous Continuous Gala Romney L, MD 25 mL/hr at 01/05/18 1002 8 mg/hr at 01/05/18 1002  . [START ON 01/08/2018] pantoprazole (PROTONIX) injection 40 mg  40 mg Intravenous Q12H Gala Romney L, MD      . tamoxifen (NOLVADEX) tablet 20 mg  20 mg Oral Daily Gala Romney L, MD   20 mg at 01/05/18 1004    Allergies as of 01/04/2018 - Review Complete 01/04/2018  Allergen Reaction Noted  . Bactrim Swelling 06/23/2011  . Lisinopril Swelling 06/23/2011  . Vasotec Swelling 06/23/2011    No family history on file.  Social History   Socioeconomic History  . Marital status: Married    Spouse name: Not on file  . Number of children: Not on file  . Years of education: Not on file  . Highest education level: Not on file  Occupational History  . Occupation: retired Education officer, museum  Social Needs  . Financial resource strain: Not on file  . Food insecurity:    Worry: Not on file    Inability: Not on file  . Transportation needs:    Medical: Not on file    Non-medical: Not on file  Tobacco Use  . Smoking  status: Former Smoker    Types: Cigarettes    Last attempt to quit: 04/14/1972    Years since quitting: 45.7  . Smokeless tobacco: Never Used  Substance and Sexual Activity  . Alcohol use: Yes    Comment: occasional  . Drug use: No  . Sexual activity: Yes    Birth control/protection: Surgical  Lifestyle  . Physical activity:    Days per week: Not on file    Minutes per session: Not on file  . Stress: Not on file  Relationships  . Social connections:    Talks on phone:  Not on file    Gets together: Not on file    Attends religious service: Not on file    Active member of club or organization: Not on file    Attends meetings of clubs or organizations: Not on file    Relationship status: Not on file  . Intimate partner violence:    Fear of current or ex partner: Not on file    Emotionally abused: Not on file    Physically abused: Not on file    Forced sexual activity: Not on file  Other Topics Concern  . Not on file  Social History Narrative  . Not on file    Review of Systems: Positive for: GI: Described in detail in HPI.    Gen: anorexia, fatigue, weakness, malaise, involuntary weight loss,Denies any fever, chills, rigors, night sweats,  and sleep disorder CV: Denies chest pain, angina, palpitations, syncope, orthopnea, PND, peripheral edema, and claudication. Resp: Denies dyspnea, cough, sputum, wheezing, coughing up blood. GU : Denies urinary burning, blood in urine, urinary frequency, urinary hesitancy, nocturnal urination, and urinary incontinence. MS: Denies joint pain or swelling.  Denies muscle weakness, cramps, atrophy.  Derm: Denies rash, itching, oral ulcerations, hives, unhealing ulcers.  Psych: Denies depression, anxiety, memory loss, suicidal ideation, hallucinations,  and confusion. Heme: 1 episode of rectal bleeding, Denies bruising, and enlarged lymph nodes. Neuro:  Denies any headaches, dizziness, paresthesias. Endo:  DM, Denies any problems with thyroid,  adrenal function.  Physical Exam: Vital signs in last 24 hours: Temp:  [98.1 F (36.7 C)-99.6 F (37.6 C)] 98.1 F (36.7 C) (09/24 0443) Pulse Rate:  [79-105] 79 (09/24 0443) Resp:  [16-26] 16 (09/24 0443) BP: (109-180)/(44-76) 145/63 (09/24 0443) SpO2:  [97 %-100 %] 97 % (09/24 0841) Last BM Date: 01/04/18  General:   Alert,  Well-developed, well-nourished, pleasant and cooperative in NAD Head:  Normocephalic and atraumatic. Eyes:  Sclera clear, no icterus.   Mild pallor. Ears:  Normal auditory acuity. Nose:  No deformity, discharge,  or lesions. Mouth:  No deformity or lesions.  Oropharynx pink & moist. Neck:  Supple; no masses or thyromegaly. Lungs:  Clear throughout to auscultation.   No wheezes, crackles, or rhonchi. No acute distress. Heart:  Regular rate and rhythm; no murmurs, clicks, rubs,  or gallops. Extremities:  Left hand bandage Neurologic:  Alert and  oriented x4;  grossly normal neurologically. Skin:  Intact without significant lesions or rashes. Psych:  Alert and cooperative. Normal mood and affect. Abdomen:  Soft, nontender and nondistended. No masses, hepatosplenomegaly or hernias noted. Normal bowel sounds, without guarding, and without rebound.         Lab Results: Recent Labs    01/04/18 2300 01/05/18 0513 01/05/18 1119  WBC 6.8 6.3 6.1  HGB 8.3* 8.2* 8.2*  HCT 26.4* 25.5* 25.5*  PLT 241 238 250   BMET Recent Labs    01/04/18 1625 01/05/18 0513  NA 140 141  K 3.5 3.5  CL 104 108  CO2 25 25  GLUCOSE 69* 65*  BUN 19 15  CREATININE 0.70 0.58  CALCIUM 9.3 8.5*   LFT Recent Labs    01/05/18 0513  PROT 6.0*  ALBUMIN 3.0*  AST 12*  ALT 7  ALKPHOS 34*  BILITOT 0.5   PT/INR Recent Labs    01/04/18 1903  LABPROT 14.1  INR 1.10    Studies/Results: No results found.  Impression: Anemia, Hb 8.2-9.7 Low MCV 74.8 Iron saturation of 7% and ferritin 25 FOBT positive,  history of melena and 1 episode of rectal  bleeding   Plan: Diagnostic EGD in am, continue clear liquid diet, NPO post midnight. Will obtain records of prior colonoscopy from 2011.   LOS: 1 day   [name], MD  01/05/2018, 11:45 AM  Pager [phone] If no answer or after 5 PM call [phone]

## 2018-01-06 ENCOUNTER — Encounter (HOSPITAL_COMMUNITY): Payer: Self-pay

## 2018-01-06 ENCOUNTER — Observation Stay (HOSPITAL_COMMUNITY): Payer: Medicare Other | Admitting: Certified Registered Nurse Anesthetist

## 2018-01-06 ENCOUNTER — Encounter (HOSPITAL_COMMUNITY)
Admission: EM | Disposition: A | Discharge status: Home / Self Care | Payer: Self-pay | Source: Home / Self Care | Attending: Emergency Medicine

## 2018-01-06 DIAGNOSIS — I9789 Other postprocedural complications and disorders of the circulatory system, not elsewhere classified: Secondary | ICD-10-CM | POA: Diagnosis not present

## 2018-01-06 DIAGNOSIS — D62 Acute posthemorrhagic anemia: Secondary | ICD-10-CM

## 2018-01-06 DIAGNOSIS — M7989 Other specified soft tissue disorders: Secondary | ICD-10-CM | POA: Diagnosis not present

## 2018-01-06 DIAGNOSIS — I10 Essential (primary) hypertension: Secondary | ICD-10-CM | POA: Diagnosis not present

## 2018-01-06 DIAGNOSIS — Z8619 Personal history of other infectious and parasitic diseases: Secondary | ICD-10-CM

## 2018-01-06 DIAGNOSIS — C50911 Malignant neoplasm of unspecified site of right female breast: Secondary | ICD-10-CM | POA: Diagnosis not present

## 2018-01-06 DIAGNOSIS — D500 Iron deficiency anemia secondary to blood loss (chronic): Secondary | ICD-10-CM | POA: Diagnosis not present

## 2018-01-06 DIAGNOSIS — R7881 Bacteremia: Secondary | ICD-10-CM | POA: Diagnosis not present

## 2018-01-06 DIAGNOSIS — K921 Melena: Secondary | ICD-10-CM | POA: Diagnosis not present

## 2018-01-06 DIAGNOSIS — E119 Type 2 diabetes mellitus without complications: Secondary | ICD-10-CM | POA: Diagnosis not present

## 2018-01-06 DIAGNOSIS — C7989 Secondary malignant neoplasm of other specified sites: Secondary | ICD-10-CM

## 2018-01-06 DIAGNOSIS — Z872 Personal history of diseases of the skin and subcutaneous tissue: Secondary | ICD-10-CM

## 2018-01-06 DIAGNOSIS — I890 Lymphedema, not elsewhere classified: Secondary | ICD-10-CM

## 2018-01-06 HISTORY — PX: ESOPHAGOGASTRODUODENOSCOPY (EGD) WITH PROPOFOL: SHX5813

## 2018-01-06 LAB — BASIC METABOLIC PANEL
Anion gap: 8 (ref 5–15)
BUN: 8 mg/dL (ref 8–23)
CHLORIDE: 110 mmol/L (ref 98–111)
CO2: 22 mmol/L (ref 22–32)
CREATININE: 0.65 mg/dL (ref 0.44–1.00)
Calcium: 8.3 mg/dL — ABNORMAL LOW (ref 8.9–10.3)
GFR calc Af Amer: >60 mL/min (ref >60)
GLUCOSE: 89 mg/dL (ref 70–99)
Potassium: 3.7 mmol/L (ref 3.5–5.1)
SODIUM: 140 mmol/L (ref 135–145)

## 2018-01-06 LAB — GLUCOSE, CAPILLARY
GLUCOSE-CAPILLARY: 135 mg/dL — AB (ref 70–99)
Glucose-Capillary: 82 mg/dL (ref 70–99)
Glucose-Capillary: 85 mg/dL (ref 70–99)
Glucose-Capillary: 114 mg/dL — ABNORMAL HIGH (ref 70–99)

## 2018-01-06 LAB — CBC
HCT: 26.6 % — ABNORMAL LOW (ref 36.0–46.0)
HEMATOCRIT: 30.8 % — AB (ref 36.0–46.0)
HEMOGLOBIN: 9.7 g/dL — AB (ref 12.0–15.0)
Hemoglobin: 8.4 g/dL — ABNORMAL LOW (ref 12.0–15.0)
MCH: 23.6 pg — ABNORMAL LOW (ref 26.0–34.0)
MCH: 24.6 pg — ABNORMAL LOW (ref 26.0–34.0)
MCHC: 31.5 g/dL (ref 30.0–36.0)
MCHC: 31.6 g/dL (ref 30.0–36.0)
MCV: 74.9 fL — ABNORMAL LOW (ref 78.0–100.0)
MCV: 77.8 fL — AB (ref 78.0–100.0)
PLATELETS: 252 10*3/uL (ref 150–400)
Platelets: 222 10*3/uL (ref 150–400)
RBC: 4.11 MIL/uL (ref 3.87–5.11)
RBC: 3.42 MIL/uL — ABNORMAL LOW (ref 3.87–5.11)
RDW: 17.7 % — AB (ref 11.5–15.5)
RDW: 18.1 % — ABNORMAL HIGH (ref 11.5–15.5)
WBC: 5.2 10*3/uL (ref 4.0–10.5)
WBC: 5.3 10*3/uL (ref 4.0–10.5)

## 2018-01-06 LAB — FOLATE RBC
FOLATE, RBC: 1053 ng/mL (ref >498)
Folate, Hemolysate: 271.6 ng/mL
HEMATOCRIT: 25.8 % — AB (ref 34.0–46.6)

## 2018-01-06 SURGERY — ESOPHAGOGASTRODUODENOSCOPY (EGD) WITH PROPOFOL
Anesthesia: Monitor Anesthesia Care

## 2018-01-06 MED ORDER — PANTOPRAZOLE SODIUM 40 MG IV SOLR
40.0000 mg | INTRAVENOUS | Status: DC
  Administered 2018-01-06: 40 mg via INTRAVENOUS
  Filled 2018-01-06 (×3): qty 40

## 2018-01-06 MED ORDER — PANTOPRAZOLE SODIUM 40 MG IV SOLR: 40.0000 mg | Freq: Every day | INTRAVENOUS | Status: DC

## 2018-01-06 MED ORDER — ONDANSETRON HCL 4 MG/2ML IJ SOLN
INTRAMUSCULAR | Status: DC | PRN
  Administered 2018-01-06: 4 mg via INTRAVENOUS

## 2018-01-06 MED ORDER — PROPOFOL 500 MG/50ML IV EMUL
INTRAVENOUS | Status: DC | PRN
  Administered 2018-01-06: 75 ug/kg/min via INTRAVENOUS

## 2018-01-06 MED ORDER — PROPOFOL 10 MG/ML IV BOLUS
INTRAVENOUS | Status: AC
  Filled 2018-01-06: qty 60

## 2018-01-06 MED ORDER — LIDOCAINE 2% (20 MG/ML) 5 ML SYRINGE
INTRAMUSCULAR | Status: DC | PRN
  Administered 2018-01-06: 100 mg via INTRAVENOUS

## 2018-01-06 MED ORDER — LOSARTAN POTASSIUM-HCTZ 100-25 MG PO TABS: 1.0000 | ORAL_TABLET | Freq: Every day | ORAL | Status: DC

## 2018-01-06 MED ORDER — HYDROCHLOROTHIAZIDE 25 MG PO TABS
25.0000 mg | ORAL_TABLET | Freq: Every day | ORAL | Status: DC
  Administered 2018-01-07: 25 mg via ORAL
  Filled 2018-01-06: qty 1

## 2018-01-06 MED ORDER — PROPOFOL 10 MG/ML IV BOLUS
INTRAVENOUS | Status: DC | PRN
  Administered 2018-01-06: 20 mg via INTRAVENOUS
  Administered 2018-01-06: 10 mg via INTRAVENOUS
  Administered 2018-01-06: 20 mg via INTRAVENOUS

## 2018-01-06 MED ORDER — SODIUM CHLORIDE 0.9 % IV SOLN
INTRAVENOUS | Status: DC
  Administered 2018-01-06: 13:00:00 via INTRAVENOUS

## 2018-01-06 MED ORDER — PEG 3350-KCL-NA BICARB-NACL 420 G PO SOLR
4000.0000 mL | Freq: Once | ORAL | Status: AC
  Administered 2018-01-06: 4000 mL via ORAL

## 2018-01-06 MED ORDER — LOSARTAN POTASSIUM 50 MG PO TABS
100.0000 mg | ORAL_TABLET | Freq: Every day | ORAL | Status: DC
  Administered 2018-01-07: 100 mg via ORAL
  Filled 2018-01-06: qty 2

## 2018-01-06 MED ORDER — AMLODIPINE BESYLATE 10 MG PO TABS
10.0000 mg | ORAL_TABLET | Freq: Every day | ORAL | Status: DC
  Administered 2018-01-07: 10 mg via ORAL
  Filled 2018-01-06: qty 1

## 2018-01-06 SURGICAL SUPPLY — 15 items

## 2018-01-06 NOTE — Interval H&P Note (Signed)
History and Physical Interval Note: 85/female with anemia and melena for EGD.  01/06/2018 12:01 PM  Tami Schmidt  has presented today for EGD, with the diagnosis of melena  The various methods of treatment have been discussed with the patient and family. After consideration of risks, benefits and other options for treatment, the patient has consented to  Procedure(s): ESOPHAGOGASTRODUODENOSCOPY (EGD) WITH PROPOFOL (N/A) as a surgical intervention .  The patient's history has been reviewed, patient examined, no change in status, stable for surgery.  I have reviewed the patient's chart and labs.  Questions were answered to the patient's satisfaction.     [name]

## 2018-01-06 NOTE — Progress Notes (Signed)
PROGRESS NOTE    Tami Schmidt  PJK:[id number] DOB: [birth year] DOA: 01/04/2018 PCP: Donald Prose, MD    Brief Narrative:  84 year old female with medical history of breast cancer status post bilateral mastectomies, diabetes, hypertension morbid obesity and osteoarthritis who presented to the emergency department complaining of weakness and black stools for 2 days prior to admission.  Upon ED evaluation patient hemoglobin was 9.7, guaiac positive x2 otherwise labs unremarkable.  Patient was admitted with working diagnosis of possible upper GI bleed  Assessment & Plan:   Principal Problem:   Melena Active Problems:   Abdominal wall mass   Chest wall recurrence of breast cancer (HCC)   HTN (hypertension)   Infection of left wrist (HCC)   Bacteremia due to methicillin susceptible Staphylococcus aureus (MSSA)   Type 2 diabetes mellitus without complication (HCC)   GI bleed   Microcytic anemia  Blood loss anemia/iron deficiency anemia -Likely secondary to GI source -GI consulted -Pt now s/p EGD 9/25 with findings of low grade Schatzki's ring and 4cm hiatal hernia, otherwise normal -Pending colonoscopy 9/26 -Repeat CBC in AM  Hypertension -BP has remained stable -BP meds on hold for now given concerns of acute blood loss  Diabetes mellitus type 2 -Glucose stable, continue current regimen. -Continue with SSI coverage  Bacteremia due to MSSA -Seen by ID -Recommendation for continuing cephalexin x 6 more days  Morbid obesity Lifestyle modification and follow-up as an outpatient. -Stable at present   DVT prophylaxis: SCD's Code Status: Full Family Communication: Patient in room, family not at bedside Disposition Plan: Uncertain at this time  Consultants:   GI  Procedures:   EGD  Antimicrobials: Anti-infectives (From admission, onward)   Start     Dose/Rate Route Frequency Ordered Stop   01/05/18 1400  cephALEXin (KEFLEX) capsule 500 mg     500 mg Oral Every  8 hours 01/05/18 1040         Subjective: No complaints at this time  Objective: Vitals:   01/06/18 1300 01/06/18 1319 01/06/18 1416 01/06/18 1635  BP: (!) 124/41 (!) 145/68 (!) 145/74 133/65  Pulse: 83 80 76 77  Resp: 19  18 18   Temp:  98 F (36.7 C) 98 F (36.7 C) 98 F (36.7 C)  TempSrc:  Oral Oral Oral  SpO2: 100% 100% 100% 100%  Height:        Intake/Output Summary (Last 24 hours) at 01/06/2018 1806 Last data filed at 01/06/2018 1400 Gross per 24 hour  Intake 2356.03 ml  Output -  Net 2356.03 ml   There were no vitals filed for this visit.  Examination:  General exam: Appears calm and comfortable  Respiratory system: Clear to auscultation. Respiratory effort normal. Cardiovascular system: S1 & S2 heard, RRR. Gastrointestinal system: Abdomen is nondistended, soft and nontender. No organomegaly or masses felt. Normal bowel sounds heard. Central nervous system: Alert and oriented. No focal neurological deficits. Extremities: Symmetric 5 x 5 power. Skin: No rashes, lesions Psychiatry: Judgement and insight appear normal. Mood & affect appropriate.   Data Reviewed: I have personally reviewed following labs and imaging studies  CBC: Recent Labs  Lab 01/05/18 0513 01/05/18 1119 01/05/18 1621 01/05/18 2237 01/06/18 0520  WBC 6.3 6.1 5.2 5.2 5.3  HGB 8.2* 8.2* 8.5* 9.7* 8.4*  HCT 25.5*  25.8* 25.5* 26.7* 30.8* 26.6*  MCV 76.8* 74.8* 76.9* 74.9* 77.8*  PLT 238 250 251 222 809   Basic Metabolic Panel: Recent Labs  Lab 01/04/18 1625 01/05/18 0513 01/06/18 0520  NA 140 141 140  K 3.5 3.5 3.7  CL 104 108 110  CO2 25 25 22   GLUCOSE 69* 65* 89  BUN 19 15 8   CREATININE 0.70 0.58 0.65  CALCIUM 9.3 8.5* 8.3*   GFR: Estimated Creatinine Clearance: 61.5 mL/min (by C-G formula based on SCr of 0.65 mg/dL). Liver Function Tests: Recent Labs  Lab 01/04/18 1625 01/05/18 0513  AST 14* 12*  ALT 8 7  ALKPHOS 43 34*  BILITOT 0.4 0.5  PROT 7.5 6.0*  ALBUMIN  3.8 3.0*   No results for input(s): LIPASE, AMYLASE in the last 168 hours. No results for input(s): AMMONIA in the last 168 hours. Coagulation Profile: Recent Labs  Lab 01/04/18 1903  INR 1.10   Cardiac Enzymes: No results for input(s): CKTOTAL, CKMB, CKMBINDEX, TROPONINI in the last 168 hours. BNP (last 3 results) No results for input(s): PROBNP in the last 8760 hours. HbA1C: No results for input(s): HGBA1C in the last 72 hours. CBG: Recent Labs  Lab 01/05/18 1649 01/05/18 2054 01/06/18 0756 01/06/18 1133 01/06/18 1650  GLUCAP 71 115* 82 85 135*   Lipid Profile: No results for input(s): CHOL, HDL, LDLCALC, TRIG, CHOLHDL, LDLDIRECT in the last 72 hours. Thyroid Function Tests: No results for input(s): TSH, T4TOTAL, FREET4, T3FREE, THYROIDAB in the last 72 hours. Anemia Panel: Recent Labs    01/05/18 0513  VITAMINB12 435  FERRITIN 25  TIBC 317  IRON 21*  RETICCTPCT 2.2   Sepsis Labs: No results for input(s): PROCALCITON, LATICACIDVEN in the last 168 hours.  No results found for this or any previous visit (from the past 240 hour(s)).   Radiology Studies: No results found.  Scheduled Meds: . [START ON 01/07/2018] amLODipine  10 mg Oral Q breakfast  . cephALEXin  500 mg Oral Q8H  . [START ON 01/07/2018] losartan  100 mg Oral Daily   And  . [START ON 01/07/2018] hydrochlorothiazide  25 mg Oral Daily  . insulin aspart  0-5 Units Subcutaneous QHS  . insulin aspart  0-9 Units Subcutaneous TID WC  . omega-3 acid ethyl esters  1 g Oral Q breakfast  . pantoprazole (PROTONIX) IV  40 mg Intravenous Q24H  . tamoxifen  20 mg Oral Daily   Continuous Infusions: . sodium chloride 100 mL/hr at 01/06/18 0637  . sodium chloride 20 mL/hr at 01/06/18 1325     LOS: 1 day   Marylu Lund, MD [facility] Pager On Amion  If 7PM-7AM, please contact night-coverage 01/06/2018, 6:06 PM

## 2018-01-06 NOTE — Anesthesia Preprocedure Evaluation (Signed)
Anesthesia Evaluation  Patient identified by MRN, date of birth, ID band Patient awake    Reviewed: Allergy & Precautions, NPO status , Patient's Chart, lab work & pertinent test results  Airway Mallampati: II  TM Distance: >3 FB Neck ROM: Full    Dental  (+) Dental Advisory Given   Pulmonary former smoker,    breath sounds clear to auscultation       Cardiovascular hypertension, Pt. on medications  Rhythm:Regular Rate:Normal     Neuro/Psych negative neurological ROS     GI/Hepatic negative GI ROS, Neg liver ROS,   Endo/Other  diabetes, Type 2, Insulin Dependent  Renal/GU negative Renal ROS     Musculoskeletal  (+) Arthritis ,   Abdominal   Peds  Hematology  (+) anemia ,   Anesthesia Other Findings   Reproductive/Obstetrics                             Lab Results  Component Value Date   WBC 5.3 01/06/2018   HGB 8.4 (L) 01/06/2018   HCT 26.6 (L) 01/06/2018   MCV 77.8 (L) 01/06/2018   PLT 252 01/06/2018   Lab Results  Component Value Date   CREATININE 0.65 01/06/2018   BUN 8 01/06/2018   NA 140 01/06/2018   K 3.7 01/06/2018   CL 110 01/06/2018   CO2 22 01/06/2018    Anesthesia Physical Anesthesia Plan  ASA: III  Anesthesia Plan: MAC   Post-op Pain Management:    Induction: Intravenous  PONV Risk Score and Plan: 2 and Propofol infusion, Ondansetron and Treatment may vary due to age or medical condition  Airway Management Planned: Natural Airway and Nasal Cannula  Additional Equipment:   Intra-op Plan:   Post-operative Plan:   Informed Consent: I have reviewed the patients History and Physical, chart, labs and discussed the procedure including the risks, benefits and alternatives for the proposed anesthesia with the patient or authorized representative who has indicated his/her understanding and acceptance.     Plan Discussed with: CRNA  Anesthesia Plan  Comments:         Anesthesia Quick Evaluation

## 2018-01-06 NOTE — Anesthesia Postprocedure Evaluation (Signed)
Anesthesia Post Note  Patient: Tami Schmidt  Procedure(s) Performed: ESOPHAGOGASTRODUODENOSCOPY (EGD) WITH PROPOFOL (N/A )     Patient location during evaluation: PACU Anesthesia Type: MAC Level of consciousness: awake and alert Pain management: pain level controlled Vital Signs Assessment: post-procedure vital signs reviewed and stable Respiratory status: spontaneous breathing, nonlabored ventilation, respiratory function stable and patient connected to nasal cannula oxygen Cardiovascular status: stable and blood pressure returned to baseline Postop Assessment: no apparent nausea or vomiting Anesthetic complications: no    Last Vitals:  Vitals:   01/06/18 1319 01/06/18 1416  BP: (!) 145/68 (!) 145/74  Pulse: 80 76  Resp:  18  Temp: 36.7 C 36.7 C  SpO2: 100% 100%    Last Pain:  Vitals:   01/06/18 1416  TempSrc: Oral  PainSc:                  [name]

## 2018-01-06 NOTE — Brief Op Note (Signed)
01/04/2018 - 01/06/2018  12:50 PM  PATIENT:  Tami Schmidt  84 y.o. female  PRE-OPERATIVE DIAGNOSIS:  melena  POST-OPERATIVE DIAGNOSIS:  normal  PROCEDURE:  Procedure(s): ESOPHAGOGASTRODUODENOSCOPY (EGD) WITH PROPOFOL (N/A)  SURGEON:  Surgeon(s) and Role:    * [name], MD - Primary  PHYSICIAN ASSISTANT:   ASSISTANTS: Rodrigo Ran, RN, Janie Billups, Tech  ANESTHESIA:   MAC  EBL:  Minimal   BLOOD ADMINISTERED:none  DRAINS: none   LOCAL MEDICATIONS USED:  None SPECIMEN:  No Specimen  DISPOSITION OF SPECIMEN:  N/A  COUNTS:  YES  TOURNIQUET:  * No tourniquets in log *  DICTATION: .Dragon Dictation  PLAN OF CARE: Admit to inpatient   PATIENT DISPOSITION:  PACU - hemodynamically stable.   Delay start of Pharmacological VTE agent (>24hrs) due to surgical blood loss or risk of bleeding: no

## 2018-01-06 NOTE — Progress Notes (Signed)
Patient ID: Tami Schmidt, female   DOB: 07-Dec-1932, 84 y.o.   MRN: [id number]         [facility]  Date of Admission:  01/04/2018   Total days of antibiotics 36         ASSESSMENT: She has some residual swelling in her left hand and wrist (always has some swelling due to her postoperative lymphedema) but her pain has resolved.  She is tolerating cephalexin well and has 1 more week of therapy.  PLAN: 1. Continue cephalexin for 6 more days 2. She has a follow-up appointment scheduled with me on 02/11/2018 3. 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 [START ON 01/07/2018] amLODipine  10 mg Oral Q breakfast  . cephALEXin  500 mg Oral Q8H  . [START ON 01/07/2018] losartan  100 mg Oral Daily   And  . [START ON 01/07/2018] hydrochlorothiazide  25 mg Oral Daily  . insulin aspart  0-5 Units Subcutaneous QHS  . insulin aspart  0-9 Units Subcutaneous TID WC  . omega-3 acid ethyl esters  1 g Oral Q breakfast  . pantoprazole (PROTONIX) IV  40 mg Intravenous Q24H  . tamoxifen  20 mg Oral Daily   Continuous Infusions: . sodium chloride 100 mL/hr at 01/06/18 0637  . sodium chloride 20 mL/hr at 01/06/18 1325   PRN Meds:.ondansetron **OR** ondansetron (ZOFRAN) IV   SUBJECTIVE: Tami Schmidt was hospitalized 5 weeks ago with MSSA bacteremia and soft tissue infection of her left hand and wrist.  She completed 4 weeks of IV cefazolin then transition to oral cephalexin 5-week ago.  She called our office yesterday complaining of progressive weakness, feeling light headed and black-colored stools over the past several days.  She was directed to the  emergency department where she was found to have microcytic anemia and melena.  She denies any history of GI bleed previously.  She has had a 5 g drop in her hemoglobin over the past year.  She says that her left hand is feeling much better.  Has increased range of motion and no further pain.  Review of Systems: Review of Systems  Constitutional: Positive for malaise/fatigue. Negative for chills, diaphoresis and fever.  Gastrointestinal: Positive for blood in stool. Negative for abdominal pain, constipation, diarrhea, nausea and vomiting.  Musculoskeletal: Negative for joint pain.  Neurological: Positive for dizziness.    Allergies  Allergen Reactions  . Bactrim Swelling    SWELLING OF MOUTH/FACE.  Marland Kitchen Lisinopril Swelling    SWELLING OF MOUTH/FACE.  Marland Kitchen Vasotec Swelling    SWELLING OF MOUTH/FACE.    OBJECTIVE: Vitals:   01/06/18 1300 01/06/18 1319 01/06/18 1416 01/06/18 1635  BP: (!) 124/41 (!) 145/68 (!) 145/74 133/65  Pulse: 83 80 76 77  Resp: 19  18 18   Temp:  98 F (36.7 C) 98 F (36.7 C) 98 F (36.7 C)  TempSrc:  Oral Oral Oral  SpO2: 100% 100% 100% 100%  Height:       Body mass index is 33.41 kg/m.  Physical Exam  Constitutional: She is oriented to person, place, and time.  She is alert  and appears comfortable resting quietly in bed.  She is in good spirits as usual.  Cardiovascular: Normal rate, regular rhythm and normal heart sounds.  Pulmonary/Chest: Effort normal and breath sounds normal.  Abdominal: Soft. She exhibits no distension. There is no tenderness.  Musculoskeletal: She exhibits edema.  She has chronic lymphedema of her left arm and hand which is at baseline.  Usual redness, warmth or tenderness of her left hand and wrist.  She has improved range of motion of her left fingers compared with her exam 1 week ago.  Neurological: She is alert and oriented to person, place, and time.  Skin: No rash noted.    Lab Results Lab Results  Component Value Date    WBC 5.3 01/06/2018   HGB 8.4 (L) 01/06/2018   HCT 26.6 (L) 01/06/2018   MCV 77.8 (L) 01/06/2018   PLT 252 01/06/2018    Lab Results  Component Value Date   CREATININE 0.65 01/06/2018   BUN 8 01/06/2018   NA 140 01/06/2018   K 3.7 01/06/2018   CL 110 01/06/2018   CO2 22 01/06/2018    Lab Results  Component Value Date   ALT 7 01/05/2018   AST 12 (L) 01/05/2018   ALKPHOS 34 (L) 01/05/2018   BILITOT 0.5 01/05/2018     Microbiology: No results found for this or any previous visit (from the past 240 hour(s)).  Michel Bickers, MD [facility] [phone] pager   [phone] cell 01/06/2018, 4:39 PM

## 2018-01-06 NOTE — Transfer of Care (Signed)
Immediate Anesthesia Transfer of Care Note  Patient: Tami Schmidt  Procedure(s) Performed: ESOPHAGOGASTRODUODENOSCOPY (EGD) WITH PROPOFOL (N/A )  Patient Location: Endoscopy Unit  Anesthesia Type:MAC  Level of Consciousness: drowsy and patient cooperative  Airway & Oxygen Therapy: Patient Spontanous Breathing and Patient connected to nasal cannula oxygen  Post-op Assessment: Report given to RN and Post -op Vital signs reviewed and stable  Post vital signs: Reviewed and stable  Last Vitals:  Vitals Value Taken Time  BP    Temp    Pulse    Resp 22 01/06/2018 12:55 PM  SpO2    Vitals shown include unvalidated device data.  Last Pain:  Vitals:   01/06/18 1149  TempSrc: Oral  PainSc: 0-No pain         Complications: No apparent anesthesia complications

## 2018-01-06 NOTE — Op Note (Signed)
[facility] Patient Name: Tami Schmidt Procedure Date: 01/06/2018 MRN: [id number] Attending MD: [name] , MD Date of Birth: 15-Nov-1932 CSN: [id number] Age: 84 Admit Type: Outpatient Procedure:                Upper GI endoscopy Indications:              Melena Providers:                [name], MD, Cleda Daub, RN, Cherylynn Ridges,                            Technician, Adair Laundry, CRNA Referring MD:              Medicines:                Monitored Anesthesia Care Complications:            No immediate complications. Estimated blood loss:                            None. Estimated Blood Loss:     Estimated blood loss: none. Procedure:                Pre-Anesthesia Assessment:                           - Prior to the procedure, a History and Physical                            was performed, and patient medications and                            allergies were reviewed. The patient's tolerance of                            previous anesthesia was also reviewed. The risks                            and benefits of the procedure and the sedation                            options and risks were discussed with the patient.                            All questions were answered, and informed consent                            was obtained. Prior Anticoagulants: The patient has                            taken aspirin, last dose was 2 days prior to                            procedure. ASA Grade Assessment: III - A patient  with severe systemic disease. After reviewing the                            risks and benefits, the patient was deemed in                            satisfactory condition to undergo the procedure.                           After obtaining informed consent, the endoscope was                            passed under direct vision. Throughout the                            procedure, the patient's blood pressure, pulse,  and                            oxygen saturations were monitored continuously. The                            GIF-H190 ([id number] Olympus adult endoscope was                            introduced through the mouth, and advanced to the                            second part of duodenum. The upper GI endoscopy was                            accomplished without difficulty. The patient                            tolerated the procedure well. Scope In: Scope Out: Findings:      A low-grade of narrowing Schatzki ring was found in the lower third of       the esophagus.      A 4 cm hiatal hernia was present.      LA Grade A (one or more mucosal breaks less than 5 mm, not extending       between tops of 2 mucosal folds) esophagitis with no bleeding was found       39 to 40 cm from the incisors.      The gastric body, incisura, gastric antrum and pylorus were normal.      The cardia and gastric fundus were normal on retroflexion.      The examined duodenum was normal. Impression:               - Low-grade of narrowing Schatzki ring.                           - 4 cm hiatal hernia.                           - LA Grade A esophagitis.                           -  Normal gastric body, incisura, antrum and pylorus.                           - Normal examined duodenum.                           - No specimens collected. Moderate Sedation:      Patient did not receive moderate sedation for this procedure, but       instead received monitored anesthesia care. Recommendation:           - Clear liquid diet.                           - Continue present medications.                           - Perform a colonoscopy tomorrow. Procedure Code(s):        --- Professional ---                           [phone], Esophagogastroduodenoscopy, flexible,                            transoral; diagnostic, including collection of                            specimen(s) by brushing or washing, when performed                             (separate procedure) Diagnosis Code(s):        --- Professional ---                           K22.2, Esophageal obstruction                           K44.9, Diaphragmatic hernia without obstruction or                            gangrene                           K20.9, Esophagitis, unspecified                           K92.1, Melena (includes Hematochezia) CPT copyright 2017 American Medical Association. All rights reserved. The codes documented in this report are preliminary and upon coder review may  be revised to meet current compliance requirements. [name], MD 01/06/2018 12:50:17 PM This report has been signed electronically. Number of Addenda: 0

## 2018-01-06 NOTE — Op Note (Signed)
EGD performed for melena and anemia. Findings: Low grade Schatzki's ring. 4 cm hiatal hernia. Otherwise, normal stomach and duodenum.  Recommend: Clear liquid diet today and colonoscopy in am, colon prep orders written.  [name], MD

## 2018-01-07 ENCOUNTER — Observation Stay (HOSPITAL_COMMUNITY): Payer: Medicare Other | Admitting: Anesthesiology

## 2018-01-07 ENCOUNTER — Encounter (HOSPITAL_COMMUNITY): Payer: Self-pay | Admitting: Anesthesiology

## 2018-01-07 ENCOUNTER — Encounter (HOSPITAL_COMMUNITY)
Admission: EM | Disposition: A | Discharge status: Home / Self Care | Payer: Self-pay | Source: Home / Self Care | Attending: Emergency Medicine

## 2018-01-07 DIAGNOSIS — K921 Melena: Secondary | ICD-10-CM | POA: Diagnosis not present

## 2018-01-07 DIAGNOSIS — E119 Type 2 diabetes mellitus without complications: Secondary | ICD-10-CM | POA: Diagnosis not present

## 2018-01-07 DIAGNOSIS — D500 Iron deficiency anemia secondary to blood loss (chronic): Secondary | ICD-10-CM | POA: Diagnosis not present

## 2018-01-07 DIAGNOSIS — R7881 Bacteremia: Secondary | ICD-10-CM | POA: Diagnosis not present

## 2018-01-07 DIAGNOSIS — I10 Essential (primary) hypertension: Secondary | ICD-10-CM | POA: Diagnosis not present

## 2018-01-07 HISTORY — PX: COLONOSCOPY WITH PROPOFOL: SHX5780

## 2018-01-07 HISTORY — PX: BIOPSY: SHX5522

## 2018-01-07 LAB — CBC
HEMATOCRIT: 24 % — AB (ref 36.0–46.0)
HEMOGLOBIN: 7.7 g/dL — AB (ref 12.0–15.0)
MCH: 24 pg — ABNORMAL LOW (ref 26.0–34.0)
MCHC: 32.1 g/dL (ref 30.0–36.0)
MCV: 74.8 fL — AB (ref 78.0–100.0)
Platelets: 243 10*3/uL (ref 150–400)
RBC: 3.21 MIL/uL — ABNORMAL LOW (ref 3.87–5.11)
RDW: 17.8 % — AB (ref 11.5–15.5)
WBC: 6.5 10*3/uL (ref 4.0–10.5)

## 2018-01-07 LAB — GLUCOSE, CAPILLARY: Glucose-Capillary: 91 mg/dL (ref 70–99)

## 2018-01-07 SURGERY — COLONOSCOPY WITH PROPOFOL
Anesthesia: Monitor Anesthesia Care

## 2018-01-07 MED ORDER — PROPOFOL 10 MG/ML IV BOLUS
INTRAVENOUS | Status: DC | PRN
  Administered 2018-01-07 (×15): 20 mg via INTRAVENOUS

## 2018-01-07 MED ORDER — PROPOFOL 10 MG/ML IV BOLUS
INTRAVENOUS | Status: AC
  Filled 2018-01-07: qty 40

## 2018-01-07 MED ORDER — PROPOFOL 10 MG/ML IV BOLUS
INTRAVENOUS | Status: AC
  Filled 2018-01-07: qty 20

## 2018-01-07 MED ORDER — PSYLLIUM 95 % PO PACK
1.0000 | PACK | Freq: Every day | ORAL | Status: DC
  Administered 2018-01-07: 1 via ORAL
  Filled 2018-01-07: qty 1

## 2018-01-07 MED ORDER — LIDOCAINE HCL 1 % IJ SOLN
INTRAMUSCULAR | Status: DC | PRN
  Administered 2018-01-07: 30 mg via INTRADERMAL

## 2018-01-07 MED ORDER — PSYLLIUM 95 % PO PACK: 1.0000 | PACK | Freq: Every day | ORAL | 0 refills | Status: AC

## 2018-01-07 MED ORDER — LACTATED RINGERS IV SOLN
INTRAVENOUS | Status: DC | PRN
  Administered 2018-01-07: 12:00:00 via INTRAVENOUS

## 2018-01-07 SURGICAL SUPPLY — 21 items

## 2018-01-07 NOTE — Anesthesia Postprocedure Evaluation (Signed)
Anesthesia Post Note  Patient: Tami Schmidt  Procedure(s) Performed: COLONOSCOPY WITH PROPOFOL (N/A ) BIOPSY     Patient location during evaluation: PACU Anesthesia Type: MAC Level of consciousness: awake and alert Pain management: pain level controlled Vital Signs Assessment: post-procedure vital signs reviewed and stable Respiratory status: spontaneous breathing, nonlabored ventilation, respiratory function stable and patient connected to nasal cannula oxygen Cardiovascular status: stable and blood pressure returned to baseline Postop Assessment: no apparent nausea or vomiting Anesthetic complications: no    Last Vitals:  Vitals:   01/07/18 1125 01/07/18 1240  BP: (!) 179/67 (!) 113/50  Pulse: 91 84  Resp: 17 (!) 27  Temp:  36.7 C  SpO2: 97% 98%    Last Pain:  Vitals:   01/07/18 1240  TempSrc: Oral  PainSc: 0-No pain                 [name]

## 2018-01-07 NOTE — Plan of Care (Signed)
Discharge instructions reviewed with patient and husband, questions answered, verbalized understanding.  Patient transported via wheelchair to main entrance of hospital to be taken home by husband.

## 2018-01-07 NOTE — Op Note (Signed)
Colonoscopy was performed due to anemia and dark stools with an unremarkable EGD. Findings: Diverticulosis noted in sigmoid, descending, transverse, hepatic flexure and cecum. 1 polyp in transverse and 2 polyps in sigmoid, removed with forceps. Otherwise unremarkable.  Recommendations: Resume high fiber diet. Metamucil/benefiber 1 tablespoon in 8 oz of water once a day to twice a day.   Tami Schmidt

## 2018-01-07 NOTE — Discharge Summary (Signed)
Physician Discharge Summary  Tami Schmidt HDQ:[id number] DOB: Feb 21, 1933 DOA: 01/04/2018  PCP: Donald Prose, MD  Admit date: 01/04/2018 Discharge date: 01/07/2018  Admitted From: Home Disposition:  home  Recommendations for Outpatient Follow-up:  1. Follow up with PCP in 2-3 weeks  Discharge Condition:Stable CODE STATUS:Full Diet recommendation: Diabetic   Brief/Interim Summary: 84 year old female with medical history of breast cancer status post bilateral mastectomies, diabetes, hypertension morbid obesity and osteoarthritis who presented to the emergency department complaining of weakness and black stools for 2 days prior to admission. Upon ED evaluation patient hemoglobin was 9.7, guaiac positive x2 otherwise labs unremarkable. Patient was admitted with working diagnosis of possible upper GI bleed  Blood loss anemia/iron deficiency anemia -Likely secondary to GI source -GI consulted -Pt now s/p EGD 9/25 with findings of low grade Schatzki's ring and 4cm hiatal hernia, otherwise normal -Underwent colonoscopy 9/26 with findings of diverticulosis, likely source of blood loss -Hgb remained stable  Hypertension -BP has remained stable  Diabetes mellitus type 2 -Glucose stable, continue current regimen. -Continue with SSI coverage while in hospital  Bacteremia due to MSSA -Seen by ID -Recommendation for continuing cephalexin  Morbid obesity Lifestyle modification and follow-up as an outpatient. -Stable at present   Discharge Diagnoses:  Principal Problem:   Melena Active Problems:   Abdominal wall mass   Chest wall recurrence of breast cancer ([place])   HTN (hypertension)   Infection of left wrist ([place])   Bacteremia due to methicillin susceptible Staphylococcus aureus (MSSA)   Type 2 diabetes mellitus without complication ([place])   GI bleed   Microcytic anemia    Discharge Instructions   Allergies as of 01/07/2018      Reactions   Bactrim Swelling   SWELLING OF MOUTH/FACE.   Lisinopril Swelling   SWELLING OF MOUTH/FACE.   Vasotec Swelling   SWELLING OF MOUTH/FACE.      Medication List    STOP taking these medications   aspirin EC 81 MG tablet     TAKE these medications   amLODipine 10 MG tablet Commonly known as:  NORVASC Take 10 mg by mouth daily with breakfast.   cephALEXin 500 MG capsule Commonly known as:  KEFLEX Take 1 capsule (500 mg total) by mouth 3 (three) times daily.   fish oil-omega-3 fatty acids 1000 MG capsule Take 1 g by mouth daily with breakfast.   HUMULIN N 100 UNIT/ML injection Generic drug:  insulin NPH Human Inject 40 Units into the skin daily.   losartan-hydrochlorothiazide 100-25 MG tablet Commonly known as:  HYZAAR Take 1 tablet by mouth daily with breakfast.   metFORMIN 500 MG tablet Commonly known as:  GLUCOPHAGE Take 500 mg by mouth daily with breakfast.   psyllium 95 % Pack Commonly known as:  HYDROCIL/METAMUCIL Take 1 packet by mouth daily.   tamoxifen 20 MG tablet Commonly known as:  NOLVADEX TAKE 1 TABLET BY MOUTH  DAILY      Follow-up Information    Donald Prose, MD. Schedule an appointment as soon as possible for a visit in 2 week(s).   Specialty:  Family Medicine Contact information: [address], [address] [place] [address] [phone]          Allergies  Allergen Reactions  . Bactrim Swelling    SWELLING OF MOUTH/FACE.  Marland Kitchen Lisinopril Swelling    SWELLING OF MOUTH/FACE.  Marland Kitchen Vasotec Swelling    SWELLING OF MOUTH/FACE.    Consultations:  ID GI  Procedures/Studies:  No results found.  Subjective: Eager to go home  Discharge Exam: Vitals:   01/07/18 1125 01/07/18 1240  BP: (!) 179/67 (!) 113/50  Pulse: 91 84  Resp: 17 (!) 27  Temp:  98 F (36.7 C)  SpO2: 97% 98%   Vitals:   01/07/18 0450 01/07/18 0902 01/07/18 1125 01/07/18 1240  BP: (!) 137/59 (!) 160/73 (!) 179/67 (!) 113/50  Pulse: 86 88 91 84  Resp: 16  17 (!) 27  Temp: 98.6  F (37 C)   98 F (36.7 C)  TempSrc: Oral   Oral  SpO2: 97% 95% 97% 98%  Height:        General: Pt is alert, awake, not in acute distress Cardiovascular: RRR, S1/S2 +, no rubs, no gallops Respiratory: CTA bilaterally, no wheezing, no rhonchi Abdominal: Soft, NT, ND, bowel sounds + Extremities: no edema, no cyanosis   The results of significant diagnostics from this hospitalization (including imaging, microbiology, ancillary and laboratory) are listed below for reference.     Microbiology: No results found for this or any previous visit (from the past 240 hour(s)).   Labs: BNP (last 3 results) No results for input(s): BNP in the last 8760 hours. Basic Metabolic Panel: Recent Labs  Lab 01/04/18 1625 01/05/18 0513 01/06/18 0520  NA 140 141 140  K 3.5 3.5 3.7  CL 104 108 110  CO2 25 25 22   GLUCOSE 69* 65* 89  BUN 19 15 8   CREATININE 0.70 0.58 0.65  CALCIUM 9.3 8.5* 8.3*   Liver Function Tests: Recent Labs  Lab 01/04/18 1625 01/05/18 0513  AST 14* 12*  ALT 8 7  ALKPHOS 43 34*  BILITOT 0.4 0.5  PROT 7.5 6.0*  ALBUMIN 3.8 3.0*   No results for input(s): LIPASE, AMYLASE in the last 168 hours. No results for input(s): AMMONIA in the last 168 hours. CBC: Recent Labs  Lab 01/05/18 1119 01/05/18 1621 01/05/18 2237 01/06/18 0520 01/07/18 0512  WBC 6.1 5.2 5.2 5.3 6.5  HGB 8.2* 8.5* 9.7* 8.4* 7.7*  HCT 25.5* 26.7* 30.8* 26.6* 24.0*  MCV 74.8* 76.9* 74.9* 77.8* 74.8*  PLT 250 251 222 252 243   Cardiac Enzymes: No results for input(s): CKTOTAL, CKMB, CKMBINDEX, TROPONINI in the last 168 hours. BNP: Invalid input(s): POCBNP CBG: Recent Labs  Lab 01/06/18 0756 01/06/18 1133 01/06/18 1650 01/06/18 2105 01/07/18 0736  GLUCAP 82 85 135* 114* 91   D-Dimer No results for input(s): DDIMER in the last 72 hours. Hgb A1c No results for input(s): HGBA1C in the last 72 hours. Lipid Profile No results for input(s): CHOL, HDL, LDLCALC, TRIG, CHOLHDL, LDLDIRECT  in the last 72 hours. Thyroid function studies No results for input(s): TSH, T4TOTAL, T3FREE, THYROIDAB in the last 72 hours.  Invalid input(s): FREET3 Anemia work up Recent Labs    01/05/18 0513  VITAMINB12 435  FERRITIN 25  TIBC 317  IRON 21*  RETICCTPCT 2.2   Urinalysis    Component Value Date/Time   COLORURINE YELLOW 12/02/2017 0615   APPEARANCEUR HAZY (A) 12/02/2017 0615   LABSPEC 1.017 12/02/2017 0615   PHURINE 6.0 12/02/2017 0615   GLUCOSEU NEGATIVE 12/02/2017 0615   HGBUR SMALL (A) 12/02/2017 0615   BILIRUBINUR NEGATIVE 12/02/2017 0615   KETONESUR NEGATIVE 12/02/2017 0615   PROTEINUR NEGATIVE 12/02/2017 0615   UROBILINOGEN 0.2 04/26/2010 0840   NITRITE NEGATIVE 12/02/2017 0615   LEUKOCYTESUR NEGATIVE 12/02/2017 0615   Sepsis Labs Invalid input(s): PROCALCITONIN,  WBC,  LACTICIDVEN Microbiology No results found for this or any  previous visit (from the past 240 hour(s)).   SIGNED:  Time spent: 30 min  Marylu Lund, MD  [facility] 01/07/2018, 1:29 PM  If 7PM-7AM, please contact night-coverage

## 2018-01-07 NOTE — Op Note (Signed)
[facility] Patient Name: Tami Schmidt Procedure Date: 01/07/2018 MRN: [id number] Attending MD: [name] , MD Date of Birth: June 13, 1932 CSN: [id number] Age: 84 Admit Type: Inpatient Procedure:                Colonoscopy Indications:              Last colonoscopy: 2011, , Hematocheziaanemia, dark                            stools, unremarkable EGD Providers:                [name], MD, Cleda Daub, RN, Cherylynn Ridges,                            Technician, Karis Juba, CRNA Referring MD:              Medicines:                Monitored Anesthesia Care Complications:            No immediate complications. Estimated blood loss:                            None. Estimated Blood Loss:     Estimated blood loss was minimal. Procedure:                Pre-Anesthesia Assessment:                           - Prior to the procedure, a History and Physical                            was performed, and patient medications and                            allergies were reviewed. The patient's tolerance of                            previous anesthesia was also reviewed. The risks                            and benefits of the procedure and the sedation                            options and risks were discussed with the patient.                            All questions were answered, and informed consent                            was obtained. Prior Anticoagulants: The patient has                            taken aspirin, last dose was 3 days prior to                            procedure.  ASA Grade Assessment: III - A patient                            with severe systemic disease. After reviewing the                            risks and benefits, the patient was deemed in                            satisfactory condition to undergo the procedure.                           After obtaining informed consent, the colonoscope                            was passed under direct  vision. Throughout the                            procedure, the patient's blood pressure, pulse, and                            oxygen saturations were monitored continuously. The                            [id number]-[id number] ([id number] Olympus peds colonscope was                            introduced through the anus and advanced to the the                            cecum, identified by appendiceal orifice and                            ileocecal valve. The patient tolerated the                            procedure well. The quality of the bowel                            preparation was fair. The colonoscopy was                            technically difficult and complex due to a                            redundant colon, significant looping and a tortuous                            colon. Successful completion of the procedure was                            aided by changing the patient to a supine position  and applying abdominal pressure. She was later                            replaced to left lateral position as she has                            vomiting. Scope In: 12:01:11 PM Scope Out: 12:31:51 PM Scope Withdrawal Time: 0 hours 7 minutes 5 seconds  Total Procedure Duration: 0 hours 30 minutes 40 seconds  Findings:      The perianal and digital rectal examinations were normal.      A 4 mm polyp was found in the transverse colon. The polyp was sessile.       The polyp was removed with a cold biopsy forceps. Resection and       retrieval were complete.      Two sessile polyps were found in the sigmoid colon. The polyps were 3 to       5 mm in size. These polyps were removed with a piecemeal technique using       a cold biopsy forceps. Resection and retrieval were complete.      Multiple small and large-mouthed diverticula were found in the sigmoid       colon, descending colon, transverse colon, hepatic flexure and cecum.      The retroflexed view of the  distal rectum and anal verge was normal and       showed no anal or rectal abnormalities. Impression:               - Preparation of the colon was fair.                           - One 4 mm polyp in the transverse colon, removed                            with a cold biopsy forceps. Resected and retrieved.                           - Two 3 to 5 mm polyps in the sigmoid colon,                            removed piecemeal using a cold biopsy forceps.                            Resected and retrieved.                           - Diverticulosis in the sigmoid colon, in the                            descending colon, in the transverse colon, at the                            hepatic flexure and in the cecum.                           - The distal rectum and anal verge  are normal on                            retroflexion view. Moderate Sedation:      Patient did not receive moderate sedation for this procedure, but       instead received monitored anesthesia care. Recommendation:           - High fiber diet.                           - Continue present medications.                           - Await pathology results.                           - Repeat colonoscopy is not recommended for                            surveillance based age. Procedure Code(s):        --- Professional ---                           [phone], Colonoscopy, flexible; with biopsy, single                            or multiple Diagnosis Code(s):        --- Professional ---                           D12.3, Benign neoplasm of transverse colon (hepatic                            flexure or splenic flexure)                           D12.5, Benign neoplasm of sigmoid colon                           K92.1, Melena (includes Hematochezia)                           K57.30, Diverticulosis of large intestine without                            perforation or abscess without bleeding CPT copyright 2017 American Medical Association.  All rights reserved. The codes documented in this report are preliminary and upon coder review may  be revised to meet current compliance requirements. [name], MD 01/07/2018 12:40:52 PM This report has been signed electronically. Number of Addenda: 0

## 2018-01-07 NOTE — Progress Notes (Signed)
Patient has drank all of her prep (100%) and is currently having dark brown liquid stools.

## 2018-01-07 NOTE — Plan of Care (Signed)
Pt progressing well with her bowel prep.

## 2018-01-07 NOTE — Transfer of Care (Signed)
Immediate Anesthesia Transfer of Care Note  Patient: Tami Schmidt  Procedure(s) Performed: COLONOSCOPY WITH PROPOFOL (N/A ) BIOPSY  Patient Location: PACU and Endoscopy Unit  Anesthesia Type:MAC  Level of Consciousness: awake, alert  and oriented  Airway & Oxygen Therapy: Patient Spontanous Breathing and Patient connected to nasal cannula oxygen  Post-op Assessment: Report given to RN and Post -op Vital signs reviewed and stable  Post vital signs: Reviewed and stable  Last Vitals:  Vitals Value Taken Time  BP 113/50 01/07/2018 12:40 PM  Temp 36.7 C 01/07/2018 12:40 PM  Pulse 88 01/07/2018 12:43 PM  Resp 21 01/07/2018 12:43 PM  SpO2 99 % 01/07/2018 12:43 PM  Vitals shown include unvalidated device data.  Last Pain:  Vitals:   01/07/18 1240  TempSrc: Oral  PainSc: 0-No pain         Complications: No apparent anesthesia complications

## 2018-01-07 NOTE — Anesthesia Preprocedure Evaluation (Signed)
Anesthesia Evaluation  Patient identified by MRN, date of birth, ID band Patient awake    Reviewed: Allergy & Precautions, NPO status , Patient's Chart, lab work & pertinent test results  Airway Mallampati: II  TM Distance: >3 FB Neck ROM: Full    Dental  (+) Dental Advisory Given   Pulmonary former smoker,    breath sounds clear to auscultation       Cardiovascular hypertension, Pt. on medications  Rhythm:Regular Rate:Normal  Normal EF. Valves okay   Neuro/Psych negative neurological ROS     GI/Hepatic negative GI ROS, Neg liver ROS,   Endo/Other  diabetes, Type 2, Insulin Dependent  Renal/GU negative Renal ROS     Musculoskeletal  (+) Arthritis ,   Abdominal   Peds  Hematology  (+) anemia ,   Anesthesia Other Findings   Reproductive/Obstetrics                             Lab Results  Component Value Date   WBC 6.5 01/07/2018   HGB 7.7 (L) 01/07/2018   HCT 24.0 (L) 01/07/2018   MCV 74.8 (L) 01/07/2018   PLT 243 01/07/2018   Lab Results  Component Value Date   CREATININE 0.65 01/06/2018   BUN 8 01/06/2018   NA 140 01/06/2018   K 3.7 01/06/2018   CL 110 01/06/2018   CO2 22 01/06/2018    Anesthesia Physical  Anesthesia Plan  ASA: III  Anesthesia Plan: MAC   Post-op Pain Management:    Induction: Intravenous  PONV Risk Score and Plan: 2 and Propofol infusion, Ondansetron and Treatment may vary due to age or medical condition  Airway Management Planned: Natural Airway and Simple Face Mask  Additional Equipment:   Intra-op Plan:   Post-operative Plan:   Informed Consent: I have reviewed the patients History and Physical, chart, labs and discussed the procedure including the risks, benefits and alternatives for the proposed anesthesia with the patient or authorized representative who has indicated his/her understanding and acceptance.     Plan Discussed with:  CRNA  Anesthesia Plan Comments:         Anesthesia Quick Evaluation

## 2018-01-07 NOTE — Brief Op Note (Signed)
01/04/2018 - 01/07/2018  12:41 PM  PATIENT:  Tami Schmidt  84 y.o. female  PRE-OPERATIVE DIAGNOSIS:  hematochezia  POST-OPERATIVE DIAGNOSIS:  polyp/diverticulosis  PROCEDURE:  Procedure(s): COLONOSCOPY WITH PROPOFOL (N/A) BIOPSY  SURGEON:  Surgeon(s) and Role:    [name], MD - Primary  PHYSICIAN ASSISTANT:   ASSISTANTS: Kathrene Bongo ANESTHESIA:   MAC  EBL:  Minimal  BLOOD ADMINISTERED:none  DRAINS: none   LOCAL MEDICATIONS USED:  NONE  SPECIMEN:  Biopsy / Limited Resection  DISPOSITION OF SPECIMEN:  PATHOLOGY  COUNTS:  YES  TOURNIQUET:  * No tourniquets in log *  DICTATION: .Dragon Dictation  PLAN OF CARE: Admit to inpatient   PATIENT DISPOSITION:  PACU - hemodynamically stable.   Delay start of Pharmacological VTE agent (>24hrs) due to surgical blood loss or risk of bleeding: no

## 2018-01-08 ENCOUNTER — Encounter (HOSPITAL_COMMUNITY): Payer: Self-pay | Admitting: Gastroenterology

## 2018-01-09 NOTE — Addendum Note (Signed)
Addendum  created 01/09/18 0854 by Suzette Battiest, MD   Intraprocedure Staff edited

## 2018-02-09 ENCOUNTER — Ambulatory Visit (HOSPITAL_COMMUNITY)
Admission: RE | Admit: 2018-02-09 | Discharge: 2018-02-09 | Disposition: A | Discharge status: Home / Self Care | Payer: Medicare Other | Source: Ambulatory Visit | Attending: Oncology | Admitting: Oncology

## 2018-02-09 ENCOUNTER — Inpatient Hospital Stay: Payer: Medicare Other | Attending: Oncology

## 2018-02-09 DIAGNOSIS — C50312 Malignant neoplasm of lower-inner quadrant of left female breast: Secondary | ICD-10-CM | POA: Insufficient documentation

## 2018-02-09 DIAGNOSIS — R9389 Abnormal findings on diagnostic imaging of other specified body structures: Secondary | ICD-10-CM | POA: Diagnosis not present

## 2018-02-09 DIAGNOSIS — Z170 Estrogen receptor positive status [ER+]: Secondary | ICD-10-CM | POA: Insufficient documentation

## 2018-02-09 DIAGNOSIS — C7989 Secondary malignant neoplasm of other specified sites: Secondary | ICD-10-CM

## 2018-02-09 DIAGNOSIS — C50912 Malignant neoplasm of unspecified site of left female breast: Secondary | ICD-10-CM

## 2018-02-09 LAB — COMPREHENSIVE METABOLIC PANEL
ALBUMIN: 3.5 g/dL (ref 3.5–5.0)
ALT: 6 U/L (ref 0–44)
ANION GAP: 10 (ref 5–15)
AST: 11 U/L — ABNORMAL LOW (ref 15–41)
Alkaline Phosphatase: 50 U/L (ref 38–126)
BUN: 12 mg/dL (ref 8–23)
CHLORIDE: 105 mmol/L (ref 98–111)
CO2: 24 mmol/L (ref 22–32)
CREATININE: 0.73 mg/dL (ref 0.44–1.00)
Calcium: 8.9 mg/dL (ref 8.9–10.3)
GFR calc non Af Amer: >60 mL/min (ref >60)
GLUCOSE: 106 mg/dL — AB (ref 70–99)
Potassium: 3.7 mmol/L (ref 3.5–5.1)
SODIUM: 139 mmol/L (ref 135–145)
Total Bilirubin: 0.5 mg/dL (ref 0.3–1.2)
Total Protein: 6.9 g/dL (ref 6.5–8.1)

## 2018-02-09 LAB — CBC WITH DIFFERENTIAL/PLATELET
Abs Immature Granulocytes: 0.02 10*3/uL (ref 0.00–0.07)
BASOS ABS: 0.1 10*3/uL (ref 0.0–0.1)
Basophils Relative: 1 %
EOS PCT: 3 %
Eosinophils Absolute: 0.2 10*3/uL (ref 0.0–0.5)
HEMATOCRIT: 34.2 % — AB (ref 36.0–46.0)
HEMOGLOBIN: 10.8 g/dL — AB (ref 12.0–15.0)
Immature Granulocytes: 0 %
LYMPHS ABS: 1.8 10*3/uL (ref 0.7–4.0)
LYMPHS PCT: 28 %
MCH: 24.9 pg — AB (ref 26.0–34.0)
MCHC: 31.6 g/dL (ref 30.0–36.0)
MCV: 79 fL — ABNORMAL LOW (ref 80.0–100.0)
MONO ABS: 0.5 10*3/uL (ref 0.1–1.0)
Monocytes Relative: 8 %
Neutro Abs: 3.7 10*3/uL (ref 1.7–7.7)
Neutrophils Relative %: 60 %
Platelets: 186 10*3/uL (ref 150–400)
RBC: 4.33 MIL/uL (ref 3.87–5.11)
RDW: 17 % — ABNORMAL HIGH (ref 11.5–15.5)
WBC: 6.2 10*3/uL (ref 4.0–10.5)
nRBC: 0 % (ref 0.0–0.2)

## 2018-02-09 MED ORDER — SODIUM CHLORIDE 0.9 % IJ SOLN
INTRAMUSCULAR | Status: AC
  Filled 2018-02-09: qty 50

## 2018-02-09 MED ORDER — IOHEXOL 300 MG/ML  SOLN
75.0000 mL | Freq: Once | INTRAMUSCULAR | Status: AC | PRN
  Administered 2018-02-09: 75 mL via INTRAVENOUS

## 2018-02-10 ENCOUNTER — Telehealth: Payer: Self-pay | Admitting: Radiology

## 2018-02-10 NOTE — Telephone Encounter (Signed)
Patient status post CT chest yesterday with extravasation of approximately 75 cc contrast into the right forearm.  She was examined by Dr. Leonia Reeves and given appropriate treatment recommendations.  Phone call made to patient this morning and she reports no acute changes.  She denies any visible redness, pain, edema, or skin breakdown to right forearm area. She was told to contact our service with any additional questions or concerns.

## 2018-02-11 ENCOUNTER — Ambulatory Visit (INDEPENDENT_AMBULATORY_CARE_PROVIDER_SITE_OTHER): Payer: Medicare Other | Admitting: Internal Medicine

## 2018-02-11 ENCOUNTER — Encounter: Payer: Self-pay | Admitting: Internal Medicine

## 2018-02-11 DIAGNOSIS — M009 Pyogenic arthritis, unspecified: Secondary | ICD-10-CM

## 2018-02-11 NOTE — Assessment & Plan Note (Signed)
I believe her bacteremia and wrist infection have been cured.  I recommended that she get a new compressive glove and have her wear that on a regular, daily basis.  She can follow-up here as needed.

## 2018-02-11 NOTE — Progress Notes (Signed)
[facility]  Patient Active Problem List   Diagnosis Date Noted  . Microcytic anemia 01/05/2018    Priority: High  . Melena 01/04/2018    Priority: High  . GI bleed 01/04/2018    Priority: High  . Bacteremia due to methicillin susceptible Staphylococcus aureus (MSSA) 12/02/2017    Priority: Medium  . Infection of left wrist ([place]) 11/30/2017    Priority: Medium  . Type 2 diabetes mellitus without complication ([place]) 10/08/9483  . Tenosynovitis of left wrist 12/02/2017  . Primary osteoarthritis of right hip 01/05/2017  . Osteoarthritis of right hip 01/03/2017  . Lymphedema of upper extremity 01/17/2014  . Osteopenia 01/17/2014  . Malignant neoplasm of lower-inner quadrant of left breast in female, estrogen receptor positive ([employer]) 06/16/2013  . Type II or unspecified type diabetes mellitus without mention of complication, not stated as uncontrolled 03/25/2013  . HTN (hypertension) 03/25/2013  . Chest wall recurrence of breast cancer ([place]) 02/21/2013  . Abdominal wall mass 01/14/2013    Patient's Medications  New Prescriptions   No medications on file  Previous Medications   AMLODIPINE (NORVASC) 10 MG TABLET    Take 10 mg by mouth daily with breakfast.    FISH OIL-OMEGA-3 FATTY ACIDS 1000 MG CAPSULE    Take 1 g by mouth daily with breakfast.    HUMULIN N 100 UNIT/ML INJECTION    Inject 40 Units into the skin daily.   LOSARTAN-HYDROCHLOROTHIAZIDE (HYZAAR) 100-25 MG PER TABLET    Take 1 tablet by mouth daily with breakfast.    METFORMIN (GLUCOPHAGE) 500 MG TABLET    Take 500 mg by mouth daily with breakfast.   TAMOXIFEN (NOLVADEX) 20 MG TABLET    TAKE 1 TABLET BY MOUTH  DAILY  Modified Medications   No medications on file  Discontinued Medications   CEPHALEXIN (KEFLEX) 500 MG CAPSULE    Take 1 capsule (500 mg total) by mouth 3 (three) times daily.    Subjective: [name] is in for her routine follow-up visit.  She has been hospitalized  on 2 occasions recently.  2 months ago she had MSSA bacteremia complicating left wrist infection.  She had quite a bit of pain, redness, warmth and swelling of her wrist at that time.  It was exacerbated by her chronic left arm lymphedema following remote breast cancer surgery.  She received 4 weeks of IV cefazolin followed by 6 weeks of oral cephalexin completing therapy on 01/13/1999 1980.  Hospitalized in late September with a GI bleed but is recovering nicely from that.  She is doing well except having more swelling in her left hand than usual.  She wears a compressive glove on her left hand as well as a compressive sleeve on her left arm.  Her glove is quite old and she feels like it is probably too loose now.  Review of Systems: Review of Systems  Constitutional: Negative for chills, diaphoresis and fever.  Gastrointestinal: Negative for abdominal pain, blood in stool, diarrhea, nausea and vomiting.  Musculoskeletal: Negative for joint pain.    Past Medical History:  Diagnosis Date  . Arthritis   . Breast cancer ([place])    b/l mastectomies hx  . Cancer [facility])    breast  . 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 Diabetes mellitus    fasting 90-100  . HTN (hypertension) 03/25/2013  . Hx of radiation therapy    breasts hx  . Hypercholesterolemia   .  Hypertension   . Lymphedema of arm    left arm  . Type II or unspecified type diabetes mellitus without mention of complication, not stated as uncontrolled 03/25/2013    Social History   Tobacco Use  . Smoking status: Former Smoker    Types: Cigarettes    Last attempt to quit: 04/14/1972    Years since quitting: 45.8  . Smokeless tobacco: Never Used  Substance Use Topics  . Alcohol use: Yes    Comment: occasional  . Drug use: No    No family history on file.  Allergies  Allergen Reactions  . Bactrim Swelling    SWELLING OF MOUTH/FACE.  Marland Kitchen Lisinopril Swelling    SWELLING OF MOUTH/FACE.  Marland Kitchen Vasotec Swelling     SWELLING OF MOUTH/FACE.    Objective: Vitals:   02/11/18 1031  BP: 133/77  Pulse: 89  Temp: 97.9 F (36.6 C)  Weight: 203 lb (92.1 kg)   Body mass index is 32.77 kg/m.  Physical Exam  Constitutional: She is oriented to person, place, and time.  She is in good spirits.  Musculoskeletal:  She has diffuse nonpitting lymphedema of her left arm extending down over the dorsum of her left hand.  There is no unusual warmth, redness or pain with palpation.  Neurological: She is alert and oriented to person, place, and time.  Skin: No rash noted.  Psychiatric: She has a normal mood and affect.    Lab Results    Problem List Items Addressed This Visit      Medium   Infection of left wrist ([place])    I believe her bacteremia and wrist infection have been cured.  I recommended that she get a new compressive glove and have her wear that on a regular, daily basis.  She can follow-up here as needed.          Michel Bickers, MD [facility] for [facility] [phone] pager   [phone] cell 02/11/2018, 11:09 AM

## 2018-02-12 NOTE — Progress Notes (Signed)
ID: Tami Schmidt OB: Mar 31, 1933  MR#: [id number]  CSN#:[id number]  PCP: Donald Prose, MD GYN:   SU: Coralie Keens, MD OTHER MD: Kyung Rudd, MD  CHIEF COMPLAINT:  1)  Hx Right Breast Cancer    2)  Hx Left Breast Cancer, with recurrence and metastatic adenopathy  CURRENT TREATMENT: Tamoxifen, [Denosumab/prolia]  BREAST CANCER HISTORY: From the earlier summary:  Tami Schmidt's history of breast cancer dates back to 1993, when she underwent biopsy of her right breast in [place]. I do not have those details, but she moved to [place] shortly thereafter and it was in [place] where she had her right lumpectomy and axillary lymph node dissection. She had a total of 6 lymph nodes removed, she tells me, and all of them were clear. She does not recall the size of the primary. She was treated with adjuvant radiation and then received tamoxifen between 1993 and 1998.  In 2002 she had a local recurrence in the right breast, and underwent right mastectomy. She received tamoxifen between 2002 and 2007.  In 2004 she underwent a left mastectomy and sentinel lymph node sampling for a 9 mm mucinous carcinoma, grade 2, multifocal, associated with ductal carcinoma in situ. The single sentinel lymph node was negative. Margins were ample. She did not receive adjuvant radiation, but continued on tamoxifen as noted above, until 2007.  More recently, in July of 2014, the patient noted a "pimple" in the left chest wall, superior to her prior mastectomy incision. She brought this to the attention of her surgeon, Dr. Ninfa Linden and he describes a dimple area with a firm mass underneath. He also describes a large lipoma in the left clavicular area and a second one on her abdominal wall. Both of these were symptomatic. All 3 masses were removed 01/26/2013. 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 The lesion abutted the closest inked resection  margin. The other 2 masses proved to be lipomas.  The patient's subsequent history is as detailed below  INTERVAL HISTORY: Tami Schmidt returns today for follow-up and treatment of her bilateral breast cancers and left chest wall recurrence. She is here alone.  Recall the patient is status post bilateral mastectomy so she no longer of course gets mammograms.  Since our last visit one year ago, she had a CT chest with contrast on 02/09/2018 that showed no evidence of breast cancer recurrence. It also revealed interval calcification of presumed lymph node beneath the left pectoralis muscle. Node measures slightly larger than exam 1 year prior but similar to PET-CT scan 2016. No metabolic activity in lesion at that time. IV contrast infiltration in the right forearm as above.  She is currently on tamoxifen, and is tolerating well. She denies hot flashes or vaginal discharge.   REVIEW OF SYSTEMS: She states that she had a left wrist infection and was treated with antibiotics for 6 weeks with Dr. Megan Salon. She states that she lost her taste sensation. She denies injuries to the left wrist and states that the infection happened spontaneously. She wears a compression glove as needed and states that she still cannot move her left hand well. She climbs stairs for exercise and denies falls. She denies unusual headaches, visual changes, nausea, vomiting, or dizziness. There has been no unusual cough, phlegm production, or pleurisy. This been no change in bowel or bladder habits. She denies unexplained fatigue or unexplained weight loss, bleeding, rash, or fever. A detailed review of  systems was otherwise stable.    PAST MEDICAL HISTORY: Past Medical History:  Diagnosis Date  . Arthritis   . Breast cancer ([place])    b/l mastectomies hx  . Cancer [facility])    breast  . 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 Diabetes mellitus    fasting 90-100  . HTN (hypertension) 03/25/2013  . Hx of radiation  therapy    breasts hx  . Hypercholesterolemia   . Hypertension   . Lymphedema of arm    left arm  . Type II or unspecified type diabetes mellitus without mention of complication, not stated as uncontrolled 03/25/2013    PAST SURGICAL HISTORY: Past Surgical History:  Procedure Laterality Date  . ABDOMINAL HYSTERECTOMY    . ANKLE SURGERY Right 1995  . APPENDECTOMY    . BIOPSY  01/07/2018   Procedure: BIOPSY;  Surgeon: [name], MD;  [facility];  Service: Gastroenterology;;  . BREAST SURGERY Bilateral    mastectomy  . COLONOSCOPY WITH PROPOFOL N/A 01/07/2018   Procedure: COLONOSCOPY WITH PROPOFOL;  Surgeon: [name], MD;  [facility];  Service: Gastroenterology;  Laterality: N/A;  . ESOPHAGOGASTRODUODENOSCOPY (EGD) WITH PROPOFOL N/A 01/06/2018   Procedure: ESOPHAGOGASTRODUODENOSCOPY (EGD) WITH PROPOFOL;  Surgeon: [name], MD;  [facility];  Service: Gastroenterology;  Laterality: N/A;  . HERNIA REPAIR  04-26-2010  . MASS EXCISION Left 01/26/2013   Procedure: EXCISION LEFT CHEST WALL MASS AND LEFT ABDOMNAL WALL MASS;  Surgeon: Harl Bowie, MD;  Location: [place];  Service: General;  Laterality: Left;  Marland Kitchen MASS EXCISION Left 09/20/2014   Procedure: EXCISION OF LEFT CHEST WALL MASS;  Surgeon: Coralie Keens, MD;  Location: [place];  Service: General;  Laterality: Left;  . TEE WITHOUT CARDIOVERSION N/A 12/08/2017   Procedure: TRANSESOPHAGEAL ECHOCARDIOGRAM (TEE);  Surgeon: Lelon Perla, MD;  Location: [facility] ENDOSCOPY;  Service: Cardiovascular;  Laterality: N/A;  . TOTAL HIP ARTHROPLASTY Right 01/05/2017  . TOTAL HIP ARTHROPLASTY Right 01/05/2017   Procedure: TOTAL HIP ARTHROPLASTY ANTERIOR APPROACH;  Surgeon: Frederik Pear, MD;  Location: [place];  Service: Orthopedics;  Laterality: Right;    FAMILY HISTORY The patient's father died from a heart attack at the age of 55. The patient's mother died at the age of 74 from myocarditis.  The patient had no brothers, one sister. There is no history of breast or ovarian cancer in the family to her knowledge  GYNECOLOGIC HISTORY:   (Reviewed 09/22/2013) Menarche age 8, first live birth age 84, the patient is [place] P5. She underwent hysterectomy in 1984. She did not use hormone replacement.  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 Their children are: Reed Breech who lives in [place] and teaches theater there; Julius Bowels who works as a Social worker in [place]; Archivist who lives in [place] and is a "radio personality"; Elta Guadeloupe, who is a Biomedical scientist in [place], and Legrand Como who is a musician living in [place]. The patient has 2 grandchildren. She attends a [employer]    ADVANCED DIRECTIVES: Not in place   HEALTH MAINTENANCE:  (Updated 09/22/2013) Social History   Tobacco Use  . Smoking status: Former Smoker    Types: Cigarettes    Last attempt to quit: 04/14/1972    Years since quitting: 45.8  . Smokeless tobacco: Never Used  Substance Use Topics  . Alcohol use: Yes  Comment: occasional  . Drug use: No    Colonoscopy: 2011  PAP: Status post hysterectomy  Bone density: 1993 ; repeat 12/27/2013 showed osteopenia with a T score of -1.8  Lipid panel: not on file  Allergies  Allergen Reactions  . Bactrim Swelling    SWELLING OF MOUTH/FACE.  Marland Kitchen Lisinopril Swelling    SWELLING OF MOUTH/FACE.  Marland Kitchen Vasotec Swelling    SWELLING OF MOUTH/FACE.    Current Outpatient Medications  Medication Sig Dispense Refill  . amLODipine (NORVASC) 10 MG tablet Take 10 mg by mouth daily with breakfast.     . fish oil-omega-3 fatty acids 1000 MG capsule Take 1 g by mouth daily with breakfast.     . HUMULIN N 100 UNIT/ML injection Inject 40 Units into the skin daily.    Marland Kitchen losartan-hydrochlorothiazide (HYZAAR) 100-25 MG per tablet Take 1 tablet by mouth daily with breakfast.     .  metFORMIN (GLUCOPHAGE) 500 MG tablet Take 500 mg by mouth daily with breakfast.     No current facility-administered medications for this visit.     OBJECTIVE: Older African American woman in no acute distress  Vitals:   02/16/18 0937  BP: (!) 144/68  Pulse: 79  Resp: 18  Temp: 98.1 F (36.7 C)  SpO2: 100%     Body mass index is 33.07 kg/m.    ECOG FS: 2 Filed Weights   02/16/18 0937  Weight: 204 lb 14.4 oz (92.9 kg)    Sclerae unicteric, pupils round and equal No cervical or supraclavicular adenopathy Lungs no rales or rhonchi Heart regular rate and rhythm Abd soft, obese, nontender, positive bowel sounds MSK no focal spinal tenderness, chronic grade 1 left upper extremity lymphedema Neuro: nonfocal, well oriented, appropriate affect Breasts: Status post bilateral mastectomies.  There is no evidence of chest wall recurrence.  Both axillae are benign.  LAB RESULTS:  Lab Results  Component Value Date   WBC 6.2 02/09/2018   NEUTROABS 3.7 02/09/2018   HGB 10.8 (L) 02/09/2018   HCT 34.2 (L) 02/09/2018   MCV 79.0 (L) 02/09/2018   PLT 186 02/09/2018      Chemistry      Component Value Date/Time   NA 139 02/09/2018 0847   NA 139 01/20/2017 1057   K 3.7 02/09/2018 0847   K 3.9 01/20/2017 1057   CL 105 02/09/2018 0847   CO2 24 02/09/2018 0847   CO2 25 01/20/2017 1057   BUN 12 02/09/2018 0847   BUN 15.6 01/20/2017 1057   CREATININE 0.73 02/09/2018 0847   CREATININE 0.7 01/20/2017 1057      Component Value Date/Time   CALCIUM 8.9 02/09/2018 0847   CALCIUM 9.3 01/20/2017 1057   ALKPHOS 50 02/09/2018 0847   ALKPHOS 69 01/20/2017 1057   AST 11 (L) 02/09/2018 0847   AST 10 01/20/2017 1057   ALT 6 02/09/2018 0847   ALT 14 01/20/2017 1057   BILITOT 0.5 02/09/2018 0847   BILITOT 0.43 01/20/2017 1057      STUDIES: Ct Chest W Contrast  Result Date: 02/09/2018 CLINICAL DATA:  Breast cancer. LEFT chest wall surgery and radiation 2014. Bowel as second 2004. EXAM:  CT CHEST WITHOUT CONTRAST TECHNIQUE: Multidetector CT imaging of the chest was performed following the standard protocol without IV contrast. IV dose infiltrated into RIGHT forearm. No IV contrast within the vascular system on the scan. Patient evaluated by attending radiologist (Dr. Leonia Reeves). No neurovascular compromise. No skin blistering. Mild-to-moderate swelling. No pain. Standard instructions in  protocol followed. COMPARISON:  Chest CT 11/30/2017, 01/20/2017 Coronary artery calcification and aortic atherosclerotic calcification. CONTRAST EXTRAVASATION CONSULTATION: Type of contrast:  Isovue 300 Site of extravasation: RIGHT forearm Estimated volume of extravasation: 75 mL ml Area of extravasation scanned with CT? no PATIENT'S SIGNS AND SYMPTOMS Skin blistering/ulceration: no Decrease capillary refill: no Change in skin color: no Decreased motor function or severe tightness: no Decreased pulses distal to site of extravasation: no Altered sensation: no Increasing pain or signs of increased swelling during observation: no TREATMENT Observation period at site: 15 minutes Limb elevation: yes Ice packs applied: yes Heat pads applied: No Plastic surgery consulted? no DOCUMENTATION AND FOLLOW-UP Site contrast extravasation forms submitted? yes Post extravasation orders completed? yes Was additional follow up assigned to PA's? Yes Patient's questions answered? yes Patient instructed to call [phone] or seek immediate medical care if symptoms progress FINDINGS: Cardiovascular: Coronary artery calcification and aortic atherosclerotic calcification. Mediastinum/Nodes: No axillary lymphadenopathy. Lobular lesion beneath the LEFT pectoralis muscle measuring 2.8 by 2.1 cm increased in size from 1 year prior at which time lesion measures 2.7 by 1.5 cm. There are new calcifications within lesion. However lesion is similar in size to PET-CT scan 10/17/2014 at which time lesion measured 4.1 x 2.5 cm. Lesion was not  hypermetabolic on the PET scan. No mediastinal adenopathy. No pericardial effusion. Esophagus normal. Lungs/Pleura: No suspicious pulmonary nodules. Upper Abdomen: Limited view of the liver, kidneys, pancreas are unremarkable. Normal adrenal glands. Musculoskeletal: No aggressive osseous lesion. Extensive osteophytosis of the thoracic spine IMPRESSION: 1. No evidence of breast cancer recurrence. 2. Interval calcification of presumed lymph node beneath the LEFT pectoralis muscle. Node measures slightly larger than exam 1 year prior but similar to PET-CT scan 2016. No metabolic activity in lesion at that time. 3. IV contrast infiltration in the RIGHT forearm as above. . 1. Electronically Signed   By: Suzy Bouchard M.D.   On: 02/09/2018 15:29     ASSESSMENT: 84 y.o. [place] woman  (1) status post right lumpectomy and axillary dissection in 1993 in [place] for what appears to have been a stage I breast cancer, treated with radiation and then tamoxifen for 5 years  (2) status post right mastectomy 2002 for a recurrence in the right breast, treated adjuvantly with tamoxifen for an additional 5 years  (3) status post left mastectomy and sentinel lymph node sampling 03/07/2003 for a lower inner quadrant pT1b, pN0, stage IA mucinous breast cancer, grade 2; no radiation therapy was given. Tamoxifen was continued until 2009  RECURRENT DISEASE: OCTOBER 2014 (4) excisional biopsy of a left chest wall mass 01/26/2013 showing carcinoma consistent with invasive ductal carcinoma, estrogen receptor 100% positive, progesterone receptor 0% positive, with an MIB-1 of 22% and no HER-2 amplification.   (5)  PET scan on 03/03/2013 confirmed extensive metastatic adenopathy in the left axilla, left subpectoral musculature, and left supraclavicular region  (6)  started on anastrozole daily beginning 03/01/2013, interrupted during radiation therapy, resumed March 2015, discontinued March 2016 because of fatigue and  arthralgias  (7) radiation therapy 03/31/2013 through 05/23/2013 Site/dose:   The patient was treated initially with a forward treatment planning technique to the left chest wall in addition to treatment to the supraclavicular region. This consisted of a 3-D conformal technique. The patient was treated in this fashion to a dose of 50.4 gray. The patient then received a 14 gray boost treatment using an electron field. The total dose was 64.4 gray.  (8) Osteopenia, zometa yearly started 07/27/2014, but poorly  tolerated.   (a) bone density scan on 12/27/13 showed a t-score of -1.8   (b) started Denosumab/Prolia April 2017, canceled after one dose per patient  (9) tamoxifen started 07/27/2014, stopped November 2019 (had total 5 years of antiestrogens)  (10) likely thalassemia trait  (11) left subpectoral soft tissue nodule noted 07/17/2015 unchanged through serial scans, most recent 11/55/2080, with no metabolic activity noted in PET scan of 2016 and interval calcification.  This is presumed benign  PLAN:  Tanaja is now 5 years out from biopsy-proven local recurrence of her breast cancer.  There is no evidence of disease activity.  She has completed 5 years of antiestrogens including anastrozole followed by tamoxifen.  We discussed the fact that she has a persistently low MCV, which has been very stable, and is therefore likely not going to be due to iron deficiency but to thalassemia.  We discussed this at length so she understands this is a condition and not a disease.  It requires only awareness so that the assumption is not made that because she has small cell she has iron deficiency.  Of course she may have iron deficiency as well but this would have to be separately established with ferritin and iron studies  We talked about her CT scan which is very convincing showing the lesion we have been following in the left subpectoral area is benign, now calcified, and not changed in the last 3  years  Finally we reviewed her breast cancer and she is ready to return to her primary care physician.  All she will need in terms of breast cancer follow-up is a yearly physician chest wall exam  I will be glad to see vaccine at any point in the future if and when the need arises but as of now are making no further routine appointments for her here.  Tami Schmidt, Virgie Dad, MD  02/16/18 10:15 AM Medical Oncology and Hematology [facility] [address] [place], [place] [address] Tel. [phone]    Fax. [phone]  Dierdre Searles Dweik am acting as scribe for Dr. Virgie Dad Cleotha Whalin.  I, Lurline Del MD, have reviewed the above documentation for accuracy and completeness, and I agree with the above.

## 2018-02-16 ENCOUNTER — Telehealth: Payer: Self-pay | Admitting: Oncology

## 2018-02-16 ENCOUNTER — Inpatient Hospital Stay: Payer: Medicare Other | Attending: Oncology | Admitting: Oncology

## 2018-02-16 VITALS — BP 144/68 | HR 79 | Temp 98.1°F | Resp 18 | Ht 66.0 in | Wt 204.9 lb

## 2018-02-16 DIAGNOSIS — Z79811 Long term (current) use of aromatase inhibitors: Secondary | ICD-10-CM | POA: Diagnosis not present

## 2018-02-16 DIAGNOSIS — Z90710 Acquired absence of both cervix and uterus: Secondary | ICD-10-CM | POA: Insufficient documentation

## 2018-02-16 DIAGNOSIS — Z79810 Long term (current) use of selective estrogen receptor modulators (SERMs): Secondary | ICD-10-CM | POA: Diagnosis not present

## 2018-02-16 DIAGNOSIS — M8580 Other specified disorders of bone density and structure, unspecified site: Secondary | ICD-10-CM | POA: Insufficient documentation

## 2018-02-16 DIAGNOSIS — Z923 Personal history of irradiation: Secondary | ICD-10-CM | POA: Insufficient documentation

## 2018-02-16 DIAGNOSIS — C773 Secondary and unspecified malignant neoplasm of axilla and upper limb lymph nodes: Secondary | ICD-10-CM | POA: Diagnosis not present

## 2018-02-16 DIAGNOSIS — C50919 Malignant neoplasm of unspecified site of unspecified female breast: Secondary | ICD-10-CM

## 2018-02-16 DIAGNOSIS — Z87891 Personal history of nicotine dependence: Secondary | ICD-10-CM | POA: Insufficient documentation

## 2018-02-16 DIAGNOSIS — I10 Essential (primary) hypertension: Secondary | ICD-10-CM | POA: Diagnosis not present

## 2018-02-16 DIAGNOSIS — Z9013 Acquired absence of bilateral breasts and nipples: Secondary | ICD-10-CM | POA: Diagnosis not present

## 2018-02-16 DIAGNOSIS — Z79899 Other long term (current) drug therapy: Secondary | ICD-10-CM | POA: Insufficient documentation

## 2018-02-16 DIAGNOSIS — C50312 Malignant neoplasm of lower-inner quadrant of left female breast: Secondary | ICD-10-CM | POA: Diagnosis not present

## 2018-02-16 DIAGNOSIS — C7989 Secondary malignant neoplasm of other specified sites: Secondary | ICD-10-CM

## 2018-02-16 DIAGNOSIS — C50911 Malignant neoplasm of unspecified site of right female breast: Secondary | ICD-10-CM | POA: Insufficient documentation

## 2018-02-16 DIAGNOSIS — Z170 Estrogen receptor positive status [ER+]: Secondary | ICD-10-CM

## 2018-02-16 NOTE — Telephone Encounter (Signed)
No 11/5 los scheduling orders.

## 2018-03-10 ENCOUNTER — Encounter (INDEPENDENT_AMBULATORY_CARE_PROVIDER_SITE_OTHER): Payer: Self-pay | Admitting: Orthopaedic Surgery

## 2018-03-10 ENCOUNTER — Ambulatory Visit (INDEPENDENT_AMBULATORY_CARE_PROVIDER_SITE_OTHER): Payer: Self-pay

## 2018-03-10 ENCOUNTER — Ambulatory Visit (INDEPENDENT_AMBULATORY_CARE_PROVIDER_SITE_OTHER): Payer: Medicare Other | Admitting: Orthopaedic Surgery

## 2018-03-10 DIAGNOSIS — M19011 Primary osteoarthritis, right shoulder: Secondary | ICD-10-CM | POA: Diagnosis not present

## 2018-03-10 DIAGNOSIS — M25511 Pain in right shoulder: Secondary | ICD-10-CM | POA: Diagnosis not present

## 2018-03-10 NOTE — Progress Notes (Signed)
Subjective: She has right shoulder pain with severe glenohumeral DJD.  She is here for ultrasound-guided glenohumeral injection.  Objective: A limited active shoulder range of motion with pain at the extremes.  Procedure: Ultrasound-guided right shoulder glenohumeral injection: After sterile prep with Betadine, injected 5 cc 1% lidocaine without epinephrine and 40 mg methylprednisolone using a 22-gauge spinal needle, passing the needle into the posterior joint recess using ultrasound to guide needle placement.  Injectate was seen filling the joint capsule.  She had very good pain relief during the immediate anesthetic phase.

## 2018-03-10 NOTE — Progress Notes (Signed)
The patient is a very pleasant 84 year old female who comes in with chronic right shoulder pain and decreased motion.  She said this is been slowly getting worse for over years now.  She said she lives in [place] when she was young and carried multiple kids up several flights of stairs for years.  She denies any specific injury to her shoulders.  On examination of her right shoulder it is incredibly stiff.  I cannot get her past 90 degrees of abduction.  There is pain and limited external and internal rotation.  There is obviously grinding at the glenohumeral joint.  Her neck exam is normal and her elbow hand and wrist exam on the right side are normal.  3 views her right shoulder show profound glenohumeral arthritic changes.  There is complete loss of joint space.  There are sclerotic changes in the humeral head and glenoid.  There are large para-articular osteophytes around the humeral head as well.  I did share with her the extent of her arthritis of her shoulder.  I do feel she would benefit from an intra-articular ultrasound-guided steroid injection in that right shoulder joint.  All question concerns were answered and addressed.  I will defer the remainder of her treatment to Dr. Junius Roads is a sports medicine MD trained with ultrasound and can provide this intervention.  For the patient's convenience he will see her today and provide this injection.

## 2018-04-19 ENCOUNTER — Encounter (INDEPENDENT_AMBULATORY_CARE_PROVIDER_SITE_OTHER): Payer: Self-pay | Admitting: Family Medicine

## 2018-04-19 ENCOUNTER — Ambulatory Visit (INDEPENDENT_AMBULATORY_CARE_PROVIDER_SITE_OTHER): Payer: Medicare Other | Admitting: Family Medicine

## 2018-04-19 DIAGNOSIS — M19011 Primary osteoarthritis, right shoulder: Secondary | ICD-10-CM

## 2018-04-19 DIAGNOSIS — M19012 Primary osteoarthritis, left shoulder: Secondary | ICD-10-CM | POA: Diagnosis not present

## 2018-04-19 MED ORDER — MELOXICAM 7.5 MG PO TABS: 7.5000 mg | ORAL_TABLET | Freq: Every day | ORAL | 1 refills | Status: DC | PRN

## 2018-04-19 NOTE — Progress Notes (Signed)
Office Visit Note   Patient: Tami Schmidt           Date of Birth: 1932-09-18           MRN: [id number] Visit Date: 04/19/2018 Requested by: Donald Prose, MD [address] [place], [place] [address] PCP: Donald Prose, MD  Subjective: Chief Complaint  Patient presents with  . Right Shoulder - Pain, Follow-up    US-guided cortisone injection 03/10/18 helped for a couple of weeks  Hurts to raise arm up to remove a plate from kitchen cabinet  . Left Shoulder - Pain    "nagging pain" x 1 month, decreased ROM due to pain    HPI: She is here with bilateral shoulder pain.  History of severe glenohumeral osteoarthritis in the right shoulder.  Injection in November gave 2 weeks of modest pain improvement but now it is hurting as before.  Her left shoulder is starting to hurt just as much.  In 2018 she had a chest x-ray and on that x-ray, her left glenohumeral joint is severely arthritic.  She has tried all kinds of over-the-counter treatments.  She has tried physical therapy which seems to make her pain worse.  She takes meloxicam which gives some relief.  She is very frustrated by her ongoing pain but would still like to avoid surgery if possible.              ROS: Noncontributory  Objective: Vital Signs: There were no vitals taken for this visit.  Physical Exam:  Shoulders: Very limited active and passive abduction of both shoulders.  She is still able to reach behind the back with both.  Isometric rotator cuff strength is 5/5 but is very painful with external rotation on the right.  Imaging: None today.  Assessment & Plan: 1.  Chronic bilateral shoulder pain with end-stage glenohumeral arthritis -She will try acupuncture and integrative therapies.  If no improvement, then possible surgical consult for shoulder replacement.   Follow-Up Instructions: No follow-ups on file.      Procedures: No procedures performed  No notes on file    PMFS History: Patient  Active Problem List   Diagnosis Date Noted  . Microcytic anemia 01/05/2018  . Melena 01/04/2018  . GI bleed 01/04/2018  . Type 2 diabetes mellitus without complication ([place]) 99/24/2683  . Bacteremia due to methicillin susceptible Staphylococcus aureus (MSSA) 12/02/2017  . Tenosynovitis of left wrist 12/02/2017  . Infection of left wrist ([place]) 11/30/2017  . Primary osteoarthritis of right hip 01/05/2017  . Osteoarthritis of right hip 01/03/2017  . Lymphedema of upper extremity 01/17/2014  . Osteopenia 01/17/2014  . Malignant neoplasm of lower-inner quadrant of left breast in female, estrogen receptor positive ([place]) 06/16/2013  . Type II or unspecified type diabetes mellitus without mention of complication, not stated as uncontrolled 03/25/2013  . HTN (hypertension) 03/25/2013  . Chest wall recurrence of breast cancer ([place]) 02/21/2013  . Abdominal wall mass 01/14/2013   Past Medical History:  Diagnosis Date  . Arthritis   . Breast cancer ([place])    b/l mastectomies hx  . Cancer [facility])    breast  . 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 Diabetes mellitus    fasting 90-100  . HTN (hypertension) 03/25/2013  . Hx of radiation therapy    breasts hx  . Hypercholesterolemia   . Hypertension   . Lymphedema of arm    left arm  . Type II or unspecified type diabetes  mellitus without mention of complication, not stated as uncontrolled 03/25/2013    History reviewed. No pertinent family history.  Past Surgical History:  Procedure Laterality Date  . ABDOMINAL HYSTERECTOMY    . ANKLE SURGERY Right 1995  . APPENDECTOMY    . BIOPSY  01/07/2018   Procedure: BIOPSY;  Surgeon: [name], MD;  [facility];  Service: Gastroenterology;;  . BREAST SURGERY Bilateral    mastectomy  . COLONOSCOPY WITH PROPOFOL N/A 01/07/2018   Procedure: COLONOSCOPY WITH PROPOFOL;  Surgeon: [name], MD;  [facility];  Service: Gastroenterology;  Laterality: N/A;  .  ESOPHAGOGASTRODUODENOSCOPY (EGD) WITH PROPOFOL N/A 01/06/2018   Procedure: ESOPHAGOGASTRODUODENOSCOPY (EGD) WITH PROPOFOL;  Surgeon: [name], MD;  [facility];  Service: Gastroenterology;  Laterality: N/A;  . HERNIA REPAIR  04-26-2010  . MASS EXCISION Left 01/26/2013   Procedure: EXCISION LEFT CHEST WALL MASS AND LEFT ABDOMNAL WALL MASS;  Surgeon: Harl Bowie, MD;  Location: [place];  Service: General;  Laterality: Left;  Marland Kitchen MASS EXCISION Left 09/20/2014   Procedure: EXCISION OF LEFT CHEST WALL MASS;  Surgeon: Coralie Keens, MD;  Location: [place];  Service: General;  Laterality: Left;  . TEE WITHOUT CARDIOVERSION N/A 12/08/2017   Procedure: TRANSESOPHAGEAL ECHOCARDIOGRAM (TEE);  Surgeon: Lelon Perla, MD;  Location: [facility] ENDOSCOPY;  Service: Cardiovascular;  Laterality: N/A;  . TOTAL HIP ARTHROPLASTY Right 01/05/2017  . TOTAL HIP ARTHROPLASTY Right 01/05/2017   Procedure: TOTAL HIP ARTHROPLASTY ANTERIOR APPROACH;  Surgeon: Frederik Pear, MD;  Location: [place];  Service: Orthopedics;  Laterality: Right;   Social History   Occupational History  . Occupation: retired Education officer, museum  Tobacco Use  . Smoking status: Former Smoker    Types: Cigarettes    Last attempt to quit: 04/14/1972    Years since quitting: 46.0  . Smokeless tobacco: Never Used  Substance and Sexual Activity  . Alcohol use: Yes    Comment: occasional  . Drug use: No  . Sexual activity: Yes    Birth control/protection: Surgical

## 2018-08-16 ENCOUNTER — Encounter: Payer: Self-pay | Admitting: Podiatry

## 2018-08-16 ENCOUNTER — Ambulatory Visit: Payer: Medicare Other | Admitting: Podiatry

## 2018-08-16 ENCOUNTER — Other Ambulatory Visit: Payer: Self-pay

## 2018-08-16 VITALS — Temp 97.5°F

## 2018-08-16 DIAGNOSIS — E119 Type 2 diabetes mellitus without complications: Secondary | ICD-10-CM | POA: Diagnosis not present

## 2018-08-16 DIAGNOSIS — M2010 Hallux valgus (acquired), unspecified foot: Secondary | ICD-10-CM

## 2018-08-16 DIAGNOSIS — L84 Corns and callosities: Secondary | ICD-10-CM | POA: Diagnosis not present

## 2018-08-16 DIAGNOSIS — B351 Tinea unguium: Secondary | ICD-10-CM

## 2018-08-16 NOTE — Patient Instructions (Addendum)
Diabetes Mellitus and Foot Care Foot care is an important part of your health, especially when you have diabetes. Diabetes may cause you to have problems because of poor blood flow (circulation) to your feet and legs, which can cause your skin to:  Become thinner and drier.  Break more easily.  Heal more slowly.  Peel and crack. You may also have nerve damage (neuropathy) in your legs and feet, causing decreased feeling in them. This means that you may not notice minor injuries to your feet that could lead to more serious problems. Noticing and addressing any potential problems early is the best way to prevent future foot problems. How to care for your feet Foot hygiene  Wash your feet daily with warm water and mild soap. Do not use hot water. Then, pat your feet and the areas between your toes until they are completely dry. Do not soak your feet as this can dry your skin.  Trim your toenails straight across. Do not dig under them or around the cuticle. File the edges of your nails with an emery board or nail file.  Apply a moisturizing lotion or petroleum jelly to the skin on your feet and to dry, brittle toenails. Use lotion that does not contain alcohol and is unscented. Do not apply lotion between your toes. Shoes and socks  Wear clean socks or stockings every day. Make sure they are not too tight. Do not wear knee-high stockings since they may decrease blood flow to your legs.  Wear shoes that fit properly and have enough cushioning. Always look in your shoes before you put them on to be sure there are no objects inside.  To break in new shoes, wear them for just a few hours a day. This prevents injuries on your feet. Wounds, scrapes, corns, and calluses  Check your feet daily for blisters, cuts, bruises, sores, and redness. If you cannot see the bottom of your feet, use a mirror or ask someone for help.  Do not cut corns or calluses or try to remove them with medicine.  If you  find a minor scrape, cut, or break in the skin on your feet, keep it and the skin around it clean and dry. You may clean these areas with mild soap and water. Do not clean the area with peroxide, alcohol, or iodine.  If you have a wound, scrape, corn, or callus on your foot, look at it several times a day to make sure it is healing and not infected. Check for: ? Redness, swelling, or pain. ? Fluid or blood. ? Warmth. ? Pus or a bad smell. General instructions  Do not cross your legs. This may decrease blood flow to your feet.  Do not use heating pads or hot water bottles on your feet. They may burn your skin. If you have lost feeling in your feet or legs, you may not know this is happening until it is too late.  Protect your feet from hot and cold by wearing shoes, such as at the beach or on hot pavement.  Schedule a complete foot exam at least once a year (annually) or more often if you have foot problems. If you have foot problems, report any cuts, sores, or bruises to your health care provider immediately. Contact a health care provider if:  You have a medical condition that increases your risk of infection and you have any cuts, sores, or bruises on your feet.  You have an injury that is not   healing.  You have redness on your legs or feet.  You feel burning or tingling in your legs or feet.  You have pain or cramps in your legs and feet.  Your legs or feet are numb.  Your feet always feel cold.  You have pain around a toenail. Get help right away if:  You have a wound, scrape, corn, or callus on your foot and: ? You have pain, swelling, or redness that gets worse. ? You have fluid or blood coming from the wound, scrape, corn, or callus. ? Your wound, scrape, corn, or callus feels warm to the touch. ? You have pus or a bad smell coming from the wound, scrape, corn, or callus. ? You have a fever. ? You have a red line going up your leg. Summary  Check your feet every day  for cuts, sores, red spots, swelling, and blisters.  Moisturize feet and legs daily.  Wear shoes that fit properly and have enough cushioning.  If you have foot problems, report any cuts, sores, or bruises to your health care provider immediately.  Schedule a complete foot exam at least once a year (annually) or more often if you have foot problems. This information is not intended to replace advice given to you by your health care provider. Make sure you discuss any questions you have with your health care provider. Document Released: 03/28/2000 Document Revised: 05/13/2017 Document Reviewed: 05/02/2016 Elsevier Interactive Patient Education  2019 [employer]  Corns and Calluses Corns are small areas of thickened skin that occur on the top, sides, or tip of a toe. They contain a cone-shaped core with a point that can press on a nerve below. This causes pain.  Calluses are areas of thickened skin that can occur anywhere on the body, including the hands, fingers, palms, soles of the feet, and heels. Calluses are usually larger than corns. What are the causes? Corns and calluses are caused by rubbing (friction) or pressure, such as from shoes that are too tight or do not fit properly. What increases the risk? Corns are more likely to develop in people who have misshapen toes (toe deformities), such as hammer toes. Calluses can occur with friction to any area of the skin. They are more likely to develop in people who:  Work with their hands.  Wear shoes that fit poorly, are too tight, or are high-heeled.  Have toe deformities. What are the signs or symptoms? Symptoms of a corn or callus include:  A hard growth on the skin.  Pain or tenderness under the skin.  Redness and swelling.  Increased discomfort while wearing tight-fitting shoes, if your feet are affected. If a corn or callus becomes infected, symptoms may include:  Redness and swelling that gets worse.  Pain.   Fluid, blood, or pus draining from the corn or callus. How is this diagnosed? Corns and calluses may be diagnosed based on your symptoms, your medical history, and a physical exam. How is this treated? Treatment for corns and calluses may include:  Removing the cause of the friction or pressure. This may involve: ? Changing your shoes. ? Wearing shoe inserts (orthotics) or other protective layers in your shoes, such as a corn pad. ? Wearing gloves.  Applying medicine to the skin (topical medicine) to help soften skin in the hardened, thickened areas.  Removing layers of dead skin with a file to reduce the size of the corn or callus.  Removing the corn or callus with a scalpel   or laser.  Taking antibiotic medicines, if your corn or callus is infected.  Having surgery, if a toe deformity is the cause. Follow these instructions at home:   Take over-the-counter and prescription medicines only as told by your health care provider.  If you were prescribed an antibiotic, take it as told by your health care provider. Do not stop taking it even if your condition starts to improve.  Wear shoes that fit well. Avoid wearing high-heeled shoes and shoes that are too tight or too loose.  Wear any padding, protective layers, gloves, or orthotics as told by your health care provider.  Soak your hands or feet and then use a file or pumice stone to soften your corn or callus. Do this as told by your health care provider.  Check your corn or callus every day for symptoms of infection. Contact a health care provider if you:  Notice that your symptoms do not improve with treatment.  Have redness or swelling that gets worse.  Notice that your corn or callus becomes painful.  Have fluid, blood, or pus coming from your corn or callus.  Have new symptoms. Summary  Corns are small areas of thickened skin that occur on the top, sides, or tip of a toe.  Calluses are areas of thickened skin that  can occur anywhere on the body, including the hands, fingers, palms, and soles of the feet. Calluses are usually larger than corns.  Corns and calluses are caused by rubbing (friction) or pressure, such as from shoes that are too tight or do not fit properly.  Treatment may include wearing any padding, protective layers, gloves, or orthotics as told by your health care provider. This information is not intended to replace advice given to you by your health care provider. Make sure you discuss any questions you have with your health care provider. Document Released: 01/05/2004 Document Revised: 02/11/2017 Document Reviewed: 02/11/2017 Elsevier Interactive Patient Education  2019 [employer] Inc.  Onychomycosis/Fungal Toenails  WHAT IS IT? An infection that lies within the keratin of your nail plate that is caused by a fungus.  WHY ME? Fungal infections affect all ages, sexes, races, and creeds.  There may be many factors that predispose you to a fungal infection such as age, coexisting medical conditions such as diabetes, or an autoimmune disease; stress, medications, fatigue, genetics, etc.  Bottom line: fungus thrives in a warm, moist environment and your shoes offer such a location.  IS IT CONTAGIOUS? Theoretically, yes.  You do not want to share shoes, nail clippers or files with someone who has fungal toenails.  Walking around barefoot in the same room or sleeping in the same bed is unlikely to transfer the organism.  It is important to realize, however, that fungus can spread easily from one nail to the next on the same foot.  HOW DO WE TREAT THIS?  There are several ways to treat this condition.  Treatment may depend on many factors such as age, medications, pregnancy, liver and kidney conditions, etc.  It is best to ask your doctor which options are available to you.  1. No treatment.   Unlike many other medical concerns, you can live with this condition.  However for many people this can be  a painful condition and may lead to ingrown toenails or a bacterial infection.  It is recommended that you keep the nails cut short to help reduce the amount of fungal nail. 2. Topical treatment.  These range from herbal remedies   to prescription strength nail lacquers.  About 40-50% effective, topicals require twice daily application for approximately 9 to 12 months or until an entirely new nail has grown out.  The most effective topicals are medical grade medications available through physicians offices. 3. Oral antifungal medications.  With an 80-90% cure rate, the most common oral medication requires 3 to 4 months of therapy and stays in your system for a year as the new nail grows out.  Oral antifungal medications do require blood work to make sure it is a safe drug for you.  A liver function panel will be performed prior to starting the medication and after the first month of treatment.  It is important to have the blood work performed to avoid any harmful side effects.  In general, this medication safe but blood work is required. 4. Laser Therapy.  This treatment is performed by applying a specialized laser to the affected nail plate.  This therapy is noninvasive, fast, and non-painful.  It is not covered by insurance and is therefore, out of pocket.  The results have been very good with a 80-95% cure rate.  [facility] is the only practice in the area to offer this therapy. 5. Permanent Nail Avulsion.  Removing the entire nail so that a new nail will not grow back. 

## 2018-08-19 ENCOUNTER — Encounter: Payer: Self-pay | Admitting: Podiatry

## 2018-08-19 NOTE — Progress Notes (Signed)
Subjective: Tami Schmidt presents today with h/o diabetes and cc of painful, discolored, thick toenails which interfere with daily activities.  Pain is aggravated when wearing enclosed shoe gear.   Tami Schmidt has been diabetic for more than 20 years. She denies any h/o foot wounds. She has some occasional numbness in feet from time to time and describes this as "not too severe".  She denies tingling, burning or pins/needles sensation.  Tami Han, MD is her PCP. Last visit was January, 2020.  Past Medical History:  Diagnosis Date  . Arthritis   . Breast cancer ([place])    b/l mastectomies hx  . Cancer [facility])    breast  . 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 Diabetes mellitus    fasting 90-100  . HTN (hypertension) 03/25/2013  . Hx of radiation therapy    breasts hx  . Hypercholesterolemia   . Hypertension   . Lymphedema of arm    left arm  . Type II or unspecified type diabetes mellitus without mention of complication, not stated as uncontrolled 03/25/2013    Patient Active Problem List   Diagnosis Date Noted  . Microcytic anemia 01/05/2018  . Melena 01/04/2018  . GI bleed 01/04/2018  . Type 2 diabetes mellitus without complication ([place]) 83/38/2505  . Bacteremia due to methicillin susceptible Staphylococcus aureus (MSSA) 12/02/2017  . Tenosynovitis of left wrist 12/02/2017  . Infection of left wrist ([place]) 11/30/2017  . Primary osteoarthritis of right hip 01/05/2017  . Osteoarthritis of right hip 01/03/2017  . Lymphedema of upper extremity 01/17/2014  . Osteopenia 01/17/2014  . 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 Dilated pore of [place] 08/02/2013  . 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 Scar 08/02/2013  . Malignant neoplasm of lower-inner quadrant of left breast in female, estrogen receptor positive ([place]) 06/16/2013  . Type II or unspecified type diabetes mellitus without mention of  complication, not stated as uncontrolled 03/25/2013  . HTN (hypertension) 03/25/2013  . Chest wall recurrence of breast cancer ([place]) 02/21/2013  . Abdominal wall mass 01/14/2013    Past Surgical History:  Procedure Laterality Date  . ABDOMINAL HYSTERECTOMY    . ANKLE SURGERY Right 1995  . APPENDECTOMY    . BIOPSY  01/07/2018   Procedure: BIOPSY;  Surgeon: [name], MD;  Location: [facility];  Service: Gastroenterology;;  . BREAST SURGERY Bilateral    mastectomy  . COLONOSCOPY WITH PROPOFOL N/A 01/07/2018   Procedure: COLONOSCOPY WITH PROPOFOL;  Surgeon: [name], MD;  Location: [facility];  Service: Gastroenterology;  Laterality: N/A;  . ESOPHAGOGASTRODUODENOSCOPY (EGD) WITH PROPOFOL N/A 01/06/2018   Procedure: ESOPHAGOGASTRODUODENOSCOPY (EGD) WITH PROPOFOL;  Surgeon: [name], MD;  Location: [facility];  Service: Gastroenterology;  Laterality: N/A;  . HERNIA REPAIR  04-26-2010  . MASS EXCISION Left 01/26/2013   Procedure: EXCISION LEFT CHEST WALL MASS AND LEFT ABDOMNAL WALL MASS;  Surgeon: Harl Bowie, MD;  Location: [place];  Service: General;  Laterality: Left;  Marland Kitchen MASS EXCISION Left 09/20/2014   Procedure: EXCISION OF LEFT CHEST WALL MASS;  Surgeon: Coralie Keens, MD;  Location: [place];  Service: General;  Laterality: Left;  . TEE WITHOUT CARDIOVERSION N/A 12/08/2017   Procedure: TRANSESOPHAGEAL ECHOCARDIOGRAM (TEE);  Surgeon: Lelon Perla, MD;  Location: [facility] ENDOSCOPY;  Service: Cardiovascular;  Laterality: N/A;  . TOTAL HIP ARTHROPLASTY Right 01/05/2017  . TOTAL HIP ARTHROPLASTY Right 01/05/2017   Procedure: TOTAL HIP ARTHROPLASTY ANTERIOR APPROACH;  Surgeon: Frederik Pear, MD;  Location: [place];  Service: Orthopedics;  Laterality: Right;     Current Outpatient Medications:  .  amLODipine (NORVASC) 10 MG tablet, Take 10 mg by mouth daily with breakfast. , Disp: , Rfl:  .  amoxicillin (AMOXIL) 500 MG capsule, TAKE 4 CAPSULES BY MOUTH 1 HOUR  PRIOR TO DENTAL APPOINTMENT, Disp: , Rfl: 0 .  fish oil-omega-3 fatty acids 1000 MG capsule, Take 1 g by mouth daily with breakfast. , Disp: , Rfl:  .  HUMULIN N 100 UNIT/ML injection, Inject 40 Units into the skin daily., Disp: , Rfl:  .  ibuprofen (ADVIL,MOTRIN) 600 MG tablet, TAKE 1 TABLET BY MOUTH TWICE DAILY TO THREE TIMES DAILY WITH FOOD AS NEEDED FOR PAIN., Disp: , Rfl: 1 .  meloxicam (MOBIC) 7.5 MG tablet, Take 1 tablet (7.5 mg total) by mouth daily as needed for pain., Disp: 90 tablet, Rfl: 1 .  metFORMIN (GLUCOPHAGE) 500 MG tablet, Take 500 mg by mouth daily with breakfast., Disp: , Rfl:  .  olmesartan-hydrochlorothiazide (BENICAR HCT) 40-12.5 MG tablet, Take 1 tablet by mouth daily., Disp: , Rfl: 1  Allergies  Allergen Reactions  . Bactrim Swelling    SWELLING OF MOUTH/FACE.  Marland Kitchen Lisinopril Swelling    SWELLING OF MOUTH/FACE.  Marland Kitchen Vasotec Swelling    SWELLING OF MOUTH/FACE.    Social History   Occupational History  . Occupation: retired Education officer, museum  Tobacco Use  . Smoking status: Former Smoker    Types: Cigarettes    Last attempt to quit: 04/14/1972    Years since quitting: 46.3  . Smokeless tobacco: Never Used  Substance and Sexual Activity  . Alcohol use: Yes    Comment: occasional  . Drug use: No  . Sexual activity: Yes    Birth control/protection: Surgical    No family history on file.  Immunization History  Administered Date(s) Administered  . Influenza, High Dose Seasonal PF 01/08/2017   Review of systems: Positive Findings in bold print.  Constitutional:  chills, fatigue, fever, sweats, weight change Communication: Optometrist, sign Ecologist, hand writing, iPad/Android device Head: headaches, head injury Eyes: changes in vision, eye pain, glaucoma, cataracts, macular degeneration, diplopia, glare,  light sensitivity, eyeglasses or contacts, blindness Ears nose mouth throat: hearing impaired, hearing aids,  ringing in ears, deaf, sign language,   vertigo,   nosebleeds,  rhinitis,  cold sores, snoring, swollen glands Cardiovascular: HTN, edema, arrhythmia, pacemaker in place, defibrillator in place, chest pain/tightness, chronic anticoagulation, blood clot, heart failure, MI Peripheral Vascular: leg cramps, varicose veins, blood clots, lymphedema, varicosities Respiratory:  difficulty breathing, denies congestion, SOB, wheezing, cough, emphysema Gastrointestinal: change in appetite or weight, abdominal pain, constipation, diarrhea, nausea, vomiting, vomiting blood, change in bowel habits, abdominal pain, jaundice, rectal bleeding, hemorrhoids, GERD Genitourinary:  nocturia,  pain on urination, polyuria,  blood in urine, Foley catheter, urinary urgency, ESRD on hemodialysis Musculoskeletal: amputation, cramping, stiff joints, painful joints, decreased joint motion, fractures, OA, gout, hemiplegia, paraplegia, uses cane, wheelchair bound, uses walker, uses rollator Skin: +changes in toenails, color change, dryness, itching, mole changes,  rash, wound(s) Neurological: headaches, numbness in feet, paresthesias in feet, burning in feet, fainting,  seizures, change in speech. denies headaches, memory problems/poor historian, cerebral palsy, weakness, paralysis, CVA, TIA Endocrine: diabetes, hypothyroidism, hyperthyroidism,  goiter, dry mouth, flushing, heat intolerance,  cold intolerance,  excessive thirst, denies polyuria,  nocturia Hematological:  easy bleeding, excessive bleeding, easy bruising, enlarged lymph nodes, on long term blood thinner, history of  past transusions Allergy/immunological:  hives, eczema, frequent infections, multiple drug allergies, seasonal allergies, transplant recipient, multiple food allergies Psychiatric:  anxiety, depression, mood disorder, suicidal ideations, hallucinations, insomnia  Objective: Vascular Examination: Capillary refill time less thank 3 seconds x 10 digits.  Dorsalis pedis pulses palpable  b/l.  Posterior tibial pulses diminished b/l.  Digital hair absent x 10 digits.  Skin temperature gradient WNL b/l.  Dermatological Examination: Skin with normal turgor, texture and tone b/l.  Toenails 1-5 b/l discolored, thick, dystrophic with subungual debris and pain with palpation to nailbeds due to thickness of nails.  Hyperkeratotic lesion plantarlateral midfoot left foot, submet head 3, submet head 5 right foot. No erythema, no edema, no drainage, no flocculence noted.  Musculoskeletal: Muscle strength 5/5 to all LE muscle groups.  Moderate HAV with bunion deformity b/l.  Hammertoe deformity 2-5 b/l.  Neurological: Sensation intact with 10 gram monofilament.  Assessment: 1. Painful onychomycosis toenails 1-5 b/l  2. Callus plantarlateral midfoot left foot, submet head 3, submet head 5 right foot 3. HAV with bunions b/l; hammertoes 2-5 b/l 4. NIDDM  Plan: 1. Discussed diabetic foot care principles. Discouraged her from getting pedicures. Literature dispensed on today. 2. Toenails 1-5 b/l were debrided in length and girth without iatrogenic bleeding. Calluses pared plantarlateral midfoot left foot, submet head 3, submet head 5 right foot utilizing sterile scalpel blade without incident. 3. Patient to continue soft, supportive shoe gear. 4. Patient to report any pedal injuries to medical professional immediately. 5. Follow up 3 months.  6. Patient/POA to call should there be a concern in the interim.

## 2018-10-06 ENCOUNTER — Ambulatory Visit: Payer: Medicare Other | Admitting: Podiatry

## 2019-03-22 ENCOUNTER — Telehealth: Payer: Self-pay | Admitting: *Deleted

## 2019-03-22 NOTE — Telephone Encounter (Signed)
This RN returned VM left by pt stating " how long am I supposed to take the tamoxifen - 5 years or for life ?"   " another one of my doctors was asking me "  Per call- pt is not on tamoxifen- per MD note pt has completed 5 years use and no longer needs to use and has been released from care.   Above verified with pt who states " my primary MD said I needed to be on it for life - but I told her what you just said - which is what Dr Jana Hakim told me"  No further needs.

## 2019-03-29 ENCOUNTER — Other Ambulatory Visit: Payer: Self-pay

## 2019-03-29 ENCOUNTER — Ambulatory Visit: Payer: Medicare Other | Admitting: Podiatry

## 2019-03-29 DIAGNOSIS — E119 Type 2 diabetes mellitus without complications: Secondary | ICD-10-CM

## 2019-03-29 DIAGNOSIS — B351 Tinea unguium: Secondary | ICD-10-CM

## 2019-03-29 DIAGNOSIS — L84 Corns and callosities: Secondary | ICD-10-CM

## 2019-03-29 NOTE — Patient Instructions (Signed)
Diabetes Mellitus and Foot Care Foot care is an important part of your health, especially when you have diabetes. Diabetes may cause you to have problems because of poor blood flow (circulation) to your feet and legs, which can cause your skin to:  Become thinner and drier.  Break more easily.  Heal more slowly.  Peel and crack. You may also have nerve damage (neuropathy) in your legs and feet, causing decreased feeling in them. This means that you may not notice minor injuries to your feet that could lead to more serious problems. Noticing and addressing any potential problems early is the best way to prevent future foot problems. How to care for your feet Foot hygiene  Wash your feet daily with warm water and mild soap. Do not use hot water. Then, pat your feet and the areas between your toes until they are completely dry. Do not soak your feet as this can dry your skin.  Trim your toenails straight across. Do not dig under them or around the cuticle. File the edges of your nails with an emery board or nail file.  Apply a moisturizing lotion or petroleum jelly to the skin on your feet and to dry, brittle toenails. Use lotion that does not contain alcohol and is unscented. Do not apply lotion between your toes. Shoes and socks  Wear clean socks or stockings every day. Make sure they are not too tight. Do not wear knee-high stockings since they may decrease blood flow to your legs.  Wear shoes that fit properly and have enough cushioning. Always look in your shoes before you put them on to be sure there are no objects inside.  To break in new shoes, wear them for just a few hours a day. This prevents injuries on your feet. Wounds, scrapes, corns, and calluses  Check your feet daily for blisters, cuts, bruises, sores, and redness. If you cannot see the bottom of your feet, use a mirror or ask someone for help.  Do not cut corns or calluses or try to remove them with medicine.  If you  find a minor scrape, cut, or break in the skin on your feet, keep it and the skin around it clean and dry. You may clean these areas with mild soap and water. Do not clean the area with peroxide, alcohol, or iodine.  If you have a wound, scrape, corn, or callus on your foot, look at it several times a day to make sure it is healing and not infected. Check for: ? Redness, swelling, or pain. ? Fluid or blood. ? Warmth. ? Pus or a bad smell. General instructions  Do not cross your legs. This may decrease blood flow to your feet.  Do not use heating pads or hot water bottles on your feet. They may burn your skin. If you have lost feeling in your feet or legs, you may not know this is happening until it is too late.  Protect your feet from hot and cold by wearing shoes, such as at the beach or on hot pavement.  Schedule a complete foot exam at least once a year (annually) or more often if you have foot problems. If you have foot problems, report any cuts, sores, or bruises to your health care provider immediately. Contact a health care provider if:  You have a medical condition that increases your risk of infection and you have any cuts, sores, or bruises on your feet.  You have an injury that is not   healing.  You have redness on your legs or feet.  You feel burning or tingling in your legs or feet.  You have pain or cramps in your legs and feet.  Your legs or feet are numb.  Your feet always feel cold.  You have pain around a toenail. Get help right away if:  You have a wound, scrape, corn, or callus on your foot and: ? You have pain, swelling, or redness that gets worse. ? You have fluid or blood coming from the wound, scrape, corn, or callus. ? Your wound, scrape, corn, or callus feels warm to the touch. ? You have pus or a bad smell coming from the wound, scrape, corn, or callus. ? You have a fever. ? You have a red line going up your leg. Summary  Check your feet every day  for cuts, sores, red spots, swelling, and blisters.  Moisturize feet and legs daily.  Wear shoes that fit properly and have enough cushioning.  If you have foot problems, report any cuts, sores, or bruises to your health care provider immediately.  Schedule a complete foot exam at least once a year (annually) or more often if you have foot problems. This information is not intended to replace advice given to you by your health care provider. Make sure you discuss any questions you have with your health care provider. Document Released: 03/28/2000 Document Revised: 05/13/2017 Document Reviewed: 05/02/2016 Elsevier Patient Education  2020 [employer]  

## 2019-04-09 ENCOUNTER — Encounter: Payer: Self-pay | Admitting: Podiatry

## 2019-04-09 NOTE — Progress Notes (Signed)
Subjective: Tami Schmidt is a 85 y.o. y.o. female with h/o diabetes who presents today for preventative diabetic foot care. Patient has painful, elongated mycotic toenails, corn(s) and callus(es) which pose a risk and interfere with daily activities. Pain is aggravated when ambulating and wearing enclosed shoe gear and relieved with periodic professional debridement.  [name], MD is patient's PCP.   Medications reviewed in chart.  Allergies  Allergen Reactions  . Bactrim Swelling    SWELLING OF MOUTH/FACE.  Marland Kitchen Lisinopril Swelling    SWELLING OF MOUTH/FACE.  Marland Kitchen Vasotec Swelling    SWELLING OF MOUTH/FACE.   Objective: There were no vitals filed for this visit.  Vascular Examination: Capillary refill time to digits <3 seconds b/l.  Dorsalis pedis pulses palpable b/l.  Posterior tibial pulses diminished b/l.  Digital hair absent b/l.  Skin temperature gradient WNL b/l.  Dermatological Examination: Skin with normal turgor, texture and tone b/l.  Toenails 1-5 b/l discolored, thick, dystrophic with subungual debris and pain with palpation to nailbeds due to thickness of nails.  Hyperkeratotic lesions submet head 5 b/l, submet head 4 left foot, submet head 3 right foot and b/l 5th PIPJ. No erythema, no edema, no drainage, no flocculence noted.   Musculoskeletal: Muscle strength 5/5 to all LE muscle groups b/l.  Bunion deformity b/l.  Hammertoes 2-5 b/l.  Neurological: Sensation intact 5/5 b/l with 10 gram monofilament.  Assessment: 1. Painful onychomycosis toenails 1-5 b/l 2.  Callus submet head 5 b/l, submet head 4 left foot, submet head 3 right foot 3.  Corns b/l 5th digits 4.  NIDDM  Plan: 1. Continue diabetic foot care principles. Literature dispensed on today. 2. Toenails 1-5 b/l were debrided in length and girth without iatrogenic bleeding. 3. Hyperkeratotic lesion(s) submet head 5 b/l, submet head 4 left foot, submet head 3 right foot and b/l  5th PIPJ pared with sterile scalpel blade without incident. 4. Patient to continue soft, supportive shoe gear daily. 5. Patient to report any pedal injuries to medical professional immediately. 6. Follow up 3 months.  7. Patient/POA to call should there be a concern in the interim.

## 2019-06-16 ENCOUNTER — Ambulatory Visit (HOSPITAL_COMMUNITY)
Admission: EM | Admit: 2019-06-16 | Discharge: 2019-06-16 | Disposition: A | Discharge status: Home / Self Care | Payer: Medicare Other | Attending: Physician Assistant | Admitting: Physician Assistant

## 2019-06-16 ENCOUNTER — Other Ambulatory Visit: Payer: Self-pay

## 2019-06-16 ENCOUNTER — Encounter (HOSPITAL_COMMUNITY): Payer: Self-pay | Admitting: Emergency Medicine

## 2019-06-16 ENCOUNTER — Other Ambulatory Visit (HOSPITAL_COMMUNITY): Payer: Self-pay | Admitting: Physician Assistant

## 2019-06-16 DIAGNOSIS — Z20822 Contact with and (suspected) exposure to covid-19: Secondary | ICD-10-CM | POA: Insufficient documentation

## 2019-06-16 DIAGNOSIS — D509 Iron deficiency anemia, unspecified: Secondary | ICD-10-CM | POA: Diagnosis not present

## 2019-06-16 DIAGNOSIS — Z791 Long term (current) use of non-steroidal anti-inflammatories (NSAID): Secondary | ICD-10-CM | POA: Insufficient documentation

## 2019-06-16 DIAGNOSIS — E876 Hypokalemia: Secondary | ICD-10-CM

## 2019-06-16 DIAGNOSIS — Z853 Personal history of malignant neoplasm of breast: Secondary | ICD-10-CM | POA: Diagnosis not present

## 2019-06-16 DIAGNOSIS — Z9013 Acquired absence of bilateral breasts and nipples: Secondary | ICD-10-CM | POA: Insufficient documentation

## 2019-06-16 DIAGNOSIS — Z881 Allergy status to other antibiotic agents status: Secondary | ICD-10-CM | POA: Insufficient documentation

## 2019-06-16 DIAGNOSIS — R197 Diarrhea, unspecified: Secondary | ICD-10-CM | POA: Diagnosis not present

## 2019-06-16 DIAGNOSIS — K5732 Diverticulitis of large intestine without perforation or abscess without bleeding: Secondary | ICD-10-CM

## 2019-06-16 DIAGNOSIS — Z888 Allergy status to other drugs, medicaments and biological substances status: Secondary | ICD-10-CM | POA: Diagnosis not present

## 2019-06-16 DIAGNOSIS — I10 Essential (primary) hypertension: Secondary | ICD-10-CM | POA: Diagnosis not present

## 2019-06-16 DIAGNOSIS — E119 Type 2 diabetes mellitus without complications: Secondary | ICD-10-CM | POA: Diagnosis not present

## 2019-06-16 DIAGNOSIS — Z794 Long term (current) use of insulin: Secondary | ICD-10-CM | POA: Insufficient documentation

## 2019-06-16 DIAGNOSIS — R112 Nausea with vomiting, unspecified: Secondary | ICD-10-CM | POA: Diagnosis present

## 2019-06-16 DIAGNOSIS — Z79899 Other long term (current) drug therapy: Secondary | ICD-10-CM | POA: Diagnosis not present

## 2019-06-16 DIAGNOSIS — E860 Dehydration: Secondary | ICD-10-CM

## 2019-06-16 DIAGNOSIS — Z87891 Personal history of nicotine dependence: Secondary | ICD-10-CM | POA: Insufficient documentation

## 2019-06-16 DIAGNOSIS — E7800 Pure hypercholesterolemia, unspecified: Secondary | ICD-10-CM | POA: Diagnosis not present

## 2019-06-16 LAB — COMPREHENSIVE METABOLIC PANEL
ALT: 9 U/L (ref 0–44)
AST: 17 U/L (ref 15–41)
Albumin: 4 g/dL (ref 3.5–5.0)
Alkaline Phosphatase: 56 U/L (ref 38–126)
Anion gap: 15 (ref 5–15)
BUN: 26 mg/dL — ABNORMAL HIGH (ref 8–23)
CO2: 24 mmol/L (ref 22–32)
Calcium: 9.3 mg/dL (ref 8.9–10.3)
Chloride: 96 mmol/L — ABNORMAL LOW (ref 98–111)
Creatinine, Ser: 1.02 mg/dL — ABNORMAL HIGH (ref 0.44–1.00)
GFR calc Af Amer: 58 mL/min — ABNORMAL LOW (ref >60)
GFR calc non Af Amer: 50 mL/min — ABNORMAL LOW (ref >60)
Glucose, Bld: 108 mg/dL — ABNORMAL HIGH (ref 70–99)
Potassium: 3.2 mmol/L — ABNORMAL LOW (ref 3.5–5.1)
Sodium: 135 mmol/L (ref 135–145)
Total Bilirubin: 0.8 mg/dL (ref 0.3–1.2)
Total Protein: 7.2 g/dL (ref 6.5–8.1)

## 2019-06-16 LAB — CBC
HCT: 47.2 % — ABNORMAL HIGH (ref 36.0–46.0)
Hemoglobin: 14.8 g/dL (ref 12.0–15.0)
MCH: 24.5 pg — ABNORMAL LOW (ref 26.0–34.0)
MCHC: 31.4 g/dL (ref 30.0–36.0)
MCV: 78.3 fL — ABNORMAL LOW (ref 80.0–100.0)
Platelets: 244 10*3/uL (ref 150–400)
RBC: 6.03 MIL/uL — ABNORMAL HIGH (ref 3.87–5.11)
RDW: 15.6 % — ABNORMAL HIGH (ref 11.5–15.5)
WBC: 7.4 10*3/uL (ref 4.0–10.5)
nRBC: 0 % (ref 0.0–0.2)

## 2019-06-16 MED ORDER — METRONIDAZOLE 500 MG PO TABS: 500.0000 mg | ORAL_TABLET | Freq: Three times a day (TID) | ORAL | 0 refills | Status: AC

## 2019-06-16 MED ORDER — POTASSIUM CHLORIDE ER 10 MEQ PO TBCR: 10.0000 meq | EXTENDED_RELEASE_TABLET | Freq: Two times a day (BID) | ORAL | 0 refills | Status: DC

## 2019-06-16 MED ORDER — ONDANSETRON HCL 4 MG PO TABS: 4.0000 mg | ORAL_TABLET | Freq: Four times a day (QID) | ORAL | 0 refills | Status: DC

## 2019-06-16 MED ORDER — CIPROFLOXACIN HCL 500 MG PO TABS: 500.0000 mg | ORAL_TABLET | Freq: Two times a day (BID) | ORAL | 0 refills | Status: AC

## 2019-06-16 NOTE — Discharge Instructions (Addendum)
I have sent in 2 antibiotics that I would like for you to take for 7 days -Ciprofloxacin twice a day -Metronidazole 3 times a day  Use the Zofran every 8 hours for your nausea. I want you to drink 5-6 bottles of water and include a yellow or blue Gatorade or Pedialyte of similar color.  Each small hearty meals  I have sent off some lab work and will call to discuss these results.  I want you to call your primary care provider to be reevaluated early next week, if you cannot be seen by your primary care provider or evaluated I would like for you to return this clinic in 3 to 4 days.  If you have severe abdominal pain, fever, worsening vomit or diarrhea, bloody diarrhea I like for you to go to the emergency department.  If your Covid-19 test is positive, you will receive a phone call from [facility] regarding your results. Negative test results are not called. Both positive and negative results area always visible on MyChart. If you do not have a MyChart account, sign up instructions are in your discharge papers.   Persons who are directed to care for themselves at home may discontinue isolation under the following conditions:   At least 10 days have passed since symptom onset and  At least 24 hours have passed without running a fever (this means without the use of fever-reducing medications) and  Other symptoms have improved.  Persons infected with COVID-19 who never develop symptoms may discontinue isolation and other precautions 10 days after the date of their first positive COVID-19 test.

## 2019-06-16 NOTE — ED Provider Notes (Signed)
[place]    CSN: [id number] Arrival date & time: 06/16/19  1144      History   Chief Complaint Chief Complaint  Patient presents with  . Abdominal Pain    HPI Tami Schmidt is a 86 y.o. female.   Patient reports today for 1 week history of nausea vomiting and diarrhea.  She reports today on Thursday that 3 days ago on Monday and Tuesday she began having nausea and vomiting every 15 to 30 minutes.  She also reports diarrhea on Monday and Tuesday.  She reports left-sided abdominal pain on Monday and Tuesday as well that was a 7/10.  However is having much less pain today.  She reports no vomiting or diarrhea on Wednesday.  And denies vomiting or diarrhea today.  She reports her last bowel movement was Tuesday evening.  She denies gas or bloating.  She reports diminished appetite.  She reports she has had minimal food intake since Monday.  She reports drinking 2x 16 ounce bottles of water each day.  She continues to endorse nausea and feeling that she may vomit but has not vomited.  She feels overall poorly.  Earlier in her symptom course she reports left-sided abdominal pain.  She denies blood in her stool or dark stool color.  She denies blood in her vomit.  She denies fever and chills.  She denies headache, cough, congestion or other upper respiratory symptoms.  She reports receiving her second Covid vaccine the Friday before symptoms started.  She has not been around anybody sick.  Per chart review she had an admission in 10/19 for suspected GI bleed.  She had colonoscopy at the time which showed diverticulosis.  At that time she had blood loss anemia and this was attributed to her diverticulosis at the time.  She does endorse a remote history of diverticulitis many years ago.     Past Medical History:  Diagnosis Date  . Arthritis   . Breast cancer ([place])    b/l mastectomies hx  . Cancer [facility])    breast  . 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  Diabetes mellitus    fasting 90-100  . HTN (hypertension) 03/25/2013  . Hx of radiation therapy    breasts hx  . Hypercholesterolemia   . Hypertension   . Lymphedema of arm    left arm  . Type II or unspecified type diabetes mellitus without mention of complication, not stated as uncontrolled 03/25/2013    Patient Active Problem List   Diagnosis Date Noted  . Microcytic anemia 01/05/2018  . Melena 01/04/2018  . GI bleed 01/04/2018  . Type 2 diabetes mellitus without complication ([place]) [id number]  . Bacteremia due to methicillin susceptible Staphylococcus aureus (MSSA) 12/02/2017  . Tenosynovitis of left wrist 12/02/2017  . Infection of left wrist ([place]) 11/30/2017  . Primary osteoarthritis of right hip 01/05/2017  . Osteoarthritis of right hip 01/03/2017  . Lymphedema of upper extremity 01/17/2014  . Osteopenia 01/17/2014  . 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 Dilated pore of [place] 08/02/2013  . 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 Scar 08/02/2013  . Malignant neoplasm of lower-inner quadrant of left breast in female, estrogen receptor positive ([place]) 06/16/2013  . Type II or unspecified type diabetes mellitus without mention of complication, not stated as uncontrolled 03/25/2013  . HTN (hypertension) 03/25/2013  . Chest wall recurrence of breast cancer ([place]) 02/21/2013  . Abdominal wall mass 01/14/2013  Past Surgical History:  Procedure Laterality Date  . ABDOMINAL HYSTERECTOMY    . ANKLE SURGERY Right 1995  . APPENDECTOMY    . BIOPSY  01/07/2018   Procedure: BIOPSY;  Surgeon: [name], MD;  Location: [facility];  Service: Gastroenterology;;  . BREAST SURGERY Bilateral    mastectomy  . COLONOSCOPY WITH PROPOFOL N/A 01/07/2018   Procedure: COLONOSCOPY WITH PROPOFOL;  Surgeon: [name], MD;  Location: [facility];  Service: Gastroenterology;  Laterality: N/A;  . ESOPHAGOGASTRODUODENOSCOPY (EGD) WITH  PROPOFOL N/A 01/06/2018   Procedure: ESOPHAGOGASTRODUODENOSCOPY (EGD) WITH PROPOFOL;  Surgeon: [name], MD;  Location: [facility];  Service: Gastroenterology;  Laterality: N/A;  . HERNIA REPAIR  04-26-2010  . MASS EXCISION Left 01/26/2013   Procedure: EXCISION LEFT CHEST WALL MASS AND LEFT ABDOMNAL WALL MASS;  Surgeon: Harl Bowie, MD;  Location: [place];  Service: General;  Laterality: Left;  Marland Kitchen MASS EXCISION Left 09/20/2014   Procedure: EXCISION OF LEFT CHEST WALL MASS;  Surgeon: Coralie Keens, MD;  Location: [place];  Service: General;  Laterality: Left;  . TEE WITHOUT CARDIOVERSION N/A 12/08/2017   Procedure: TRANSESOPHAGEAL ECHOCARDIOGRAM (TEE);  Surgeon: Lelon Perla, MD;  Location[facility] END[facility]COPY;  Service: Cardiovascular;  Laterality: N/A;  . TOTAL HIP ARTHROPLASTY Right 01/05/2017  . TOTAL HIP ARTHROPLASTY Right 01/05/2017   Procedure: TOTAL HIP ARTHROPLASTY ANTERIOR APPROACH;  Surgeon: Frederik Pear, MD;  Location: [place];  Service: Orthopedics;  Laterality: Right;    OB History   No obstetric history on file.      Home Medications    Prior to Admission medications   Medication Sig Start Date End Date Taking? Authorizing Provider  amLODipine (NORVASC) 10 MG tablet Take 10 mg by mouth daily with breakfast.     [provider]  amoxicillin (AMOXIL) 500 MG capsule TAKE 4 CAPSULES BY MOUTH 1 HOUR PRIOR TO DENTAL APPOINTMENT 02/03/18   [provider]  ciprofloxacin (CIPRO) 500 MG tablet Take 1 tablet (500 mg total) by mouth 2 (two) times daily for 7 days. 06/16/19 06/23/19  Monic Engelmann, Marguerita Beards, PA-C  diclofenac Sodium (VOLTAREN) 1 % GEL Apply  a small amount to affected area three times a day  Apply 2 g to the skin over affected area(s) 3 times daily 03/22/19   [provider]  fish oil-omega-3 fatty acids 1000 MG capsule Take 1 g by mouth daily with breakfast.     [provider]  gabapentin (NEURONTIN) 300 MG capsule Take 300  mg by mouth 3 (three) times daily. 03/22/19   [provider]  HUMULIN N 100 UNIT/ML injection Inject 40 Units into the skin daily. 11/29/16   [provider]  ibuprofen (ADVIL,MOTRIN) 600 MG tablet TAKE 1 TABLET BY MOUTH TWICE DAILY TO THREE TIMES DAILY WITH FOOD AS NEEDED FOR PAIN. 02/10/18   [provider]  meloxicam (MOBIC) 7.5 MG tablet Take 1 tablet (7.5 mg total) by mouth daily as needed for pain. 04/19/18   Hilts, Legrand Como, MD  metFORMIN (GLUCOPHAGE) 500 MG tablet Take 500 mg by mouth daily with breakfast.    [provider]  metoprolol succinate (TOPROL-XL) 25 MG 24 hr tablet  11/11/18   [provider]  metroNIDAZOLE (FLAGYL) 500 MG tablet Take 1 tablet (500 mg total) by mouth 3 (three) times daily for 7 days. 06/16/19 06/23/19  Oluwasemilore Bahl, Marguerita Beards, PA-C  olmesartan-hydrochlorothiazide (BENICAR HCT) 40-12.5 MG tablet Take 1 tablet by mouth daily. 02/26/18   [provider]  ondansetron (ZOFRAN) 4 MG tablet Take 1 tablet (4 mg total) by mouth every 6 (six) hours. 06/16/19   Monroe Qin, Marguerita Beards, PA-C  Vitamin D, Ergocalciferol, (DRISDOL) 1.25 MG (50000 UT) CAPS capsule Take 50,000 Units by mouth once a week. 11/11/18   [provider]    Family History No family history on file.  Social History Social History   Tobacco Use  . Smoking status: Former Smoker    Types: Cigarettes    Quit date: 04/14/1972    Years since quitting: 47.2  . Smokeless tobacco: Never Used  Substance Use Topics  . Alcohol use: Yes    Comment: occasional  . Drug use: No     Allergies   Bactrim, Lisinopril, and Vasotec   Review of Systems Review of Systems  Constitutional: Positive for appetite change and fatigue. Negative for chills and fever.  HENT: Negative for congestion, ear pain, sinus pressure, sinus pain and sore throat.   Eyes: Negative for pain and visual disturbance.  Respiratory: Negative for cough and shortness of breath.   Cardiovascular:  Negative for chest pain and palpitations.  Gastrointestinal: Positive for abdominal pain, diarrhea, nausea and vomiting. Negative for blood in stool.  Genitourinary: Negative for dysuria and hematuria.  Musculoskeletal: Positive for myalgias. Negative for arthralgias and back pain.  Skin: Negative for color change and rash.  Neurological: Negative for seizures, syncope and headaches.  All other systems reviewed and are negative.    Physical Exam Triage Vital Signs ED Triage Vitals  Enc Vitals Group     BP 06/16/19 1311 (!) 131/59     Pulse Rate 06/16/19 1311 82     Resp 06/16/19 1311 (!) 24     Temp 06/16/19 1311 97.8 F (36.6 C)     Temp src --      SpO2 06/16/19 1311 100 %     Weight --      Height --      Head Circumference --      Peak Flow --      Pain Score 06/16/19 1312 7     Pain Loc --      Pain Edu? --      Excl. in [place]? --    No data found.  Updated Vital Signs BP (!) 131/59   Pulse 82   Temp 97.8 F (36.6 C)   Resp (!) 24   SpO2 100%   Visual Acuity Right Eye Distance:   Left Eye Distance:   Bilateral Distance:    Right Eye Near:   Left Eye Near:    Bilateral Near:     Physical Exam Vitals and nursing note reviewed.  Constitutional:      General: She is not in acute distress.    Appearance: She is well-developed. She is ill-appearing.  HENT:     Head: Normocephalic and atraumatic.     Mouth/Throat:     Mouth: Mucous membranes are moist.     Pharynx: Oropharynx is clear.  Eyes:     Extraocular Movements: Extraocular movements intact.     Conjunctiva/sclera: Conjunctivae normal.     Pupils: Pupils are equal, round, and reactive to light.  Cardiovascular:     Rate and Rhythm: Normal rate and regular rhythm.     Heart sounds: No murmur.  Pulmonary:     Effort: Pulmonary effort is normal. No respiratory distress.     Breath sounds: Normal breath sounds. No wheezing, rhonchi or rales.  Abdominal:  General: Abdomen is protuberant. There are  no signs of injury.     Palpations: Abdomen is soft. There is no fluid wave.     Tenderness: There is abdominal tenderness in the left lower quadrant. There is no right CVA tenderness, left CVA tenderness, guarding or rebound.     Hernia: No hernia is present.  Musculoskeletal:     Cervical back: Neck supple.  Skin:    General: Skin is warm and dry.  Neurological:     General: No focal deficit present.     Mental Status: She is alert and oriented to person, place, and time.  Psychiatric:        Mood and Affect: Mood normal.        Behavior: Behavior normal.      UC Treatments / Results  Labs (all labs ordered are listed, but only abnormal results are displayed) Labs Reviewed  COMPREHENSIVE METABOLIC PANEL - Abnormal; Notable for the following components:      Result Value   Potassium 3.2 (*)    Chloride 96 (*)    Glucose, Bld 108 (*)    BUN 26 (*)    Creatinine, Ser 1.02 (*)    GFR calc non Af Amer 50 (*)    GFR calc Af Amer 58 (*)    All other components within normal limits  CBC - Abnormal; Notable for the following components:   RBC 6.03 (*)    HCT 47.2 (*)    MCV 78.3 (*)    MCH 24.5 (*)    RDW 15.6 (*)    All other components within normal limits  NOVEL CORONAVIRUS, NAA (HOSP ORDER, SEND-OUT TO REF LAB; TAT 18-24 HRS)    EKG   Radiology No results found.  Procedures Procedures (including critical care time)  Medications Ordered in UC Medications - No data to display  Initial Impression / Assessment and Plan / UC Course  I have reviewed the triage vital signs and the nursing notes.  Pertinent labs & imaging results that were available during my care of the patient were reviewed by me and considered in my medical decision making (see chart for details).     #Diverticulitis #Nausea and vomiting #Dehydration Patient is a 86 year old female with past medical history of diverticulosis on colonoscopy presenting with symptoms that are consistent with  possible diverticulitis given history of diverticulosis.  Results of CMP consistent with dehydration picture as well.  Potassium at 3.2 slight elevation in creatinine from baseline at 1.02 up from 0.73 in 2019.  White blood cell count of 7.4.  She was called and notified of these results after visit concluded as these labs returned after she was discharged.  Ensuring with white blood cell count in normal range.  Suspect potassium and creatinine bump are secondary to dehydration -Ciprofloxacin and metronidazole were prescribed -Zofran given for nausea nausea -Patient instructed to increase p.o. intake -Patient instructed to follow-up with primary care next week for reevaluation and lab recheck. -Patient was called in 10 mEq potassium tablets were prescribed for twice a day for 7 days. -Strict ED precautions were discussed and patient understands these  Final Clinical Impressions(s) / UC Diagnoses   Final diagnoses:  Nausea vomiting and diarrhea  Diverticulitis of colon without hemorrhage     Discharge Instructions     I have sent in 2 antibiotics that I would like for you to take for 7 days -Ciprofloxacin twice a day -Metronidazole 3 times a day  Use the Zofran  every 8 hours for your nausea. I want you to drink 5-6 bottles of water and include a yellow or blue Gatorade or Pedialyte of similar color.  Each small hearty meals  I have sent off some lab work and will call to discuss these results.  I want you to call your primary care provider to be reevaluated early next week, if you cannot be seen by your primary care provider or evaluated I would like for you to return this clinic in 3 to 4 days.  If you have severe abdominal pain, fever, worsening vomit or diarrhea, bloody diarrhea I like for you to go to the emergency department.  If your Covid-19 test is positive, you will receive a phone call from [facility] regarding your results. Negative test results are not called. Both positive  and negative results area always visible on MyChart. If you do not have a MyChart account, sign up instructions are in your discharge papers.   Persons who are directed to care for themselves at home may discontinue isolation under the following conditions:  . At least 10 days have passed since symptom onset and . At least 24 hours have passed without running a fever (this means without the use of fever-reducing medications) and . Other symptoms have improved.  Persons infected with COVID-19 who never develop symptoms may discontinue isolation and other precautions 10 days after the date of their first positive COVID-19 test.       ED Prescriptions    Medication Sig Dispense Auth. Provider   ciprofloxacin (CIPRO) 500 MG tablet Take 1 tablet (500 mg total) by mouth 2 (two) times daily for 7 days. 14 tablet Karagan Lehr, Marguerita Beards, PA-C   metroNIDAZOLE (FLAGYL) 500 MG tablet Take 1 tablet (500 mg total) by mouth 3 (three) times daily for 7 days. 21 tablet Vic Esco, Marguerita Beards, PA-C   ondansetron (ZOFRAN) 4 MG tablet Take 1 tablet (4 mg total) by mouth every 6 (six) hours. 12 tablet Pelham Hennick, Marguerita Beards, PA-C     PDMP not reviewed this encounter.   Purnell Shoemaker, PA-C 06/16/19 2005

## 2019-06-16 NOTE — Progress Notes (Signed)
Patient called and notified of low potassium.  Also discussed the slight increase in her kidney functions and that this is likely related to her recent dehydration and that she should drink plenty of water as discussed at visit.  Discussed return precautions as noted in visit note.  Potassium tablets 10 mEq  prescribed for twice a day for 7 days.  Discussed the patient needs to schedule follow-up with primary care for lab recheck and reevaluation of symptoms related to today's visit.  Patient verbalizes understanding of plan

## 2019-06-16 NOTE — ED Triage Notes (Signed)
Pt states she received her 2nd covid vaccine on Friday, on Monday she started vomiting and having diarrhea, through Wednesday. Vomiting diarrhea stopped, now has abdominal pain in the left lower side, feels tired.

## 2019-06-20 LAB — NOVEL CORONAVIRUS, NAA (HOSP ORDER, SEND-OUT TO REF LAB; TAT 18-24 HRS): SARS-CoV-2, NAA: NOT DETECTED

## 2019-06-28 ENCOUNTER — Encounter: Payer: Self-pay | Admitting: Podiatry

## 2019-06-28 ENCOUNTER — Ambulatory Visit: Payer: Medicare Other | Admitting: Podiatry

## 2019-06-28 ENCOUNTER — Other Ambulatory Visit: Payer: Self-pay

## 2019-06-28 DIAGNOSIS — B351 Tinea unguium: Secondary | ICD-10-CM | POA: Diagnosis not present

## 2019-06-28 DIAGNOSIS — E1151 Type 2 diabetes mellitus with diabetic peripheral angiopathy without gangrene: Secondary | ICD-10-CM | POA: Diagnosis not present

## 2019-06-28 DIAGNOSIS — L84 Corns and callosities: Secondary | ICD-10-CM

## 2019-06-28 NOTE — Progress Notes (Signed)
Subjective: Moreen Fowler Hickling presents today for follow up of preventative diabetic foot care and corn(s) b/l 5th digits and callus(es) b/l and painful mycotic nails b/l.  Pain interferes with ambulation. Aggravating factors include wearing enclosed shoe gear.   She is complaining of painful callus submet 5th base left foot. Denies any redness, swelling or drainage.  Allergies  Allergen Reactions  . Bactrim Swelling    SWELLING OF MOUTH/FACE.  Marland Kitchen Lisinopril Swelling    SWELLING OF MOUTH/FACE.  Marland Kitchen Vasotec Swelling    SWELLING OF MOUTH/FACE.     Objective: There were no vitals filed for this visit.  Pt 86 y.o. year old AA  female WD, WN in NAD. AAO x 3.   Vascular Examination:  Capillary fill time to digits <3 seconds b/l. Palpable DP pulses b/l. Nonpalpable PT pulses b/l. Pedal hair absent b/l Skin temperature gradient within normal limits b/l.  Dermatological Examination: Pedal skin with normal turgor, texture and tone bilaterally. No open wounds bilaterally. No interdigital macerations bilaterally. Toenails 1-5 b/l elongated, dystrophic, thickened, crumbly with subungual debris and tenderness to dorsal palpation. Hyperkeratotic lesion(s) lateral nailfolds b/l 5th digits, submet 5th base b/l, submet head 5 bl and submet head 4 left foot.  No erythema, no edema, no drainage, no flocculence.  Musculoskeletal: Normal muscle strength 5/5 to all lower extremity muscle groups bilaterally, no pain crepitus or joint limitation noted with ROM b/l, bunion deformity noted b/l and hammertoes noted to the  2-5 bilaterally  Neurological: Protective sensation intact 5/5 intact bilaterally with 10g monofilament b/l  Assessment: 1. Onychomycosis   2. Corns and callosities   3. Type II diabetes mellitus with peripheral circulatory disorder (HCC)    Plan: -Continue diabetic foot care principles. Literature dispensed on today.  -Toenails 1-5 b/l were debrided in length and girth with sterile nail  nippers and dremel without iatrogenic bleeding.  -Corns b/l 5th digits and calluses submet 5th base b/l, submet head 5 b/l and submet head 4 left foot were debrided without complication or incident. Total number debrided =7. -Patient to continue soft, supportive shoe gear daily. -Patient to report any pedal injuries to medical professional immediately. -Patient/POA to call should there be question/concern in the interim.  Return in about 3 months (around 09/28/2019) for diabetic nail and callus trim.

## 2019-06-28 NOTE — Patient Instructions (Signed)
Diabetes Mellitus and Foot Care Foot care is an important part of your health, especially when you have diabetes. Diabetes may cause you to have problems because of poor blood flow (circulation) to your feet and legs, which can cause your skin to:  Become thinner and drier.  Break more easily.  Heal more slowly.  Peel and crack. You may also have nerve damage (neuropathy) in your legs and feet, causing decreased feeling in them. This means that you may not notice minor injuries to your feet that could lead to more serious problems. Noticing and addressing any potential problems early is the best way to prevent future foot problems. How to care for your feet Foot hygiene  Wash your feet daily with warm water and mild soap. Do not use hot water. Then, pat your feet and the areas between your toes until they are completely dry. Do not soak your feet as this can dry your skin.  Trim your toenails straight across. Do not dig under them or around the cuticle. File the edges of your nails with an emery board or nail file.  Apply a moisturizing lotion or petroleum jelly to the skin on your feet and to dry, brittle toenails. Use lotion that does not contain alcohol and is unscented. Do not apply lotion between your toes. Shoes and socks  Wear clean socks or stockings every day. Make sure they are not too tight. Do not wear knee-high stockings since they may decrease blood flow to your legs.  Wear shoes that fit properly and have enough cushioning. Always look in your shoes before you put them on to be sure there are no objects inside.  To break in new shoes, wear them for just a few hours a day. This prevents injuries on your feet. Wounds, scrapes, corns, and calluses  Check your feet daily for blisters, cuts, bruises, sores, and redness. If you cannot see the bottom of your feet, use a mirror or ask someone for help.  Do not cut corns or calluses or try to remove them with medicine.  If you  find a minor scrape, cut, or break in the skin on your feet, keep it and the skin around it clean and dry. You may clean these areas with mild soap and water. Do not clean the area with peroxide, alcohol, or iodine.  If you have a wound, scrape, corn, or callus on your foot, look at it several times a day to make sure it is healing and not infected. Check for: ? Redness, swelling, or pain. ? Fluid or blood. ? Warmth. ? Pus or a bad smell. General instructions  Do not cross your legs. This may decrease blood flow to your feet.  Do not use heating pads or hot water bottles on your feet. They may burn your skin. If you have lost feeling in your feet or legs, you may not know this is happening until it is too late.  Protect your feet from hot and cold by wearing shoes, such as at the beach or on hot pavement.  Schedule a complete foot exam at least once a year (annually) or more often if you have foot problems. If you have foot problems, report any cuts, sores, or bruises to your health care provider immediately. Contact a health care provider if:  You have a medical condition that increases your risk of infection and you have any cuts, sores, or bruises on your feet.  You have an injury that is not   healing.  You have redness on your legs or feet.  You feel burning or tingling in your legs or feet.  You have pain or cramps in your legs and feet.  Your legs or feet are numb.  Your feet always feel cold.  You have pain around a toenail. Get help right away if:  You have a wound, scrape, corn, or callus on your foot and: ? You have pain, swelling, or redness that gets worse. ? You have fluid or blood coming from the wound, scrape, corn, or callus. ? Your wound, scrape, corn, or callus feels warm to the touch. ? You have pus or a bad smell coming from the wound, scrape, corn, or callus. ? You have a fever. ? You have a red line going up your leg. Summary  Check your feet every day  for cuts, sores, red spots, swelling, and blisters.  Moisturize feet and legs daily.  Wear shoes that fit properly and have enough cushioning.  If you have foot problems, report any cuts, sores, or bruises to your health care provider immediately.  Schedule a complete foot exam at least once a year (annually) or more often if you have foot problems. This information is not intended to replace advice given to you by your health care provider. Make sure you discuss any questions you have with your health care provider. Document Revised: 12/22/2018 Document Reviewed: 05/02/2016 Elsevier Patient Education  2020 [employer]  

## 2019-07-12 ENCOUNTER — Inpatient Hospital Stay (HOSPITAL_COMMUNITY)
Admission: EM | Admit: 2019-07-12 | Discharge: 2019-07-16 | DRG: 378 | Disposition: A | Discharge status: Home / Self Care | Payer: Medicare Other | Attending: Internal Medicine | Admitting: Internal Medicine

## 2019-07-12 ENCOUNTER — Other Ambulatory Visit: Payer: Self-pay

## 2019-07-12 ENCOUNTER — Encounter (HOSPITAL_COMMUNITY): Payer: Self-pay | Admitting: Emergency Medicine

## 2019-07-12 DIAGNOSIS — I10 Essential (primary) hypertension: Secondary | ICD-10-CM | POA: Diagnosis not present

## 2019-07-12 DIAGNOSIS — E669 Obesity, unspecified: Secondary | ICD-10-CM | POA: Diagnosis present

## 2019-07-12 DIAGNOSIS — Z923 Personal history of irradiation: Secondary | ICD-10-CM

## 2019-07-12 DIAGNOSIS — Z888 Allergy status to other drugs, medicaments and biological substances status: Secondary | ICD-10-CM

## 2019-07-12 DIAGNOSIS — Z87891 Personal history of nicotine dependence: Secondary | ICD-10-CM

## 2019-07-12 DIAGNOSIS — Z794 Long term (current) use of insulin: Secondary | ICD-10-CM

## 2019-07-12 DIAGNOSIS — Z6834 Body mass index (BMI) 34.0-34.9, adult: Secondary | ICD-10-CM

## 2019-07-12 DIAGNOSIS — Z9013 Acquired absence of bilateral breasts and nipples: Secondary | ICD-10-CM

## 2019-07-12 DIAGNOSIS — K5733 Diverticulitis of large intestine without perforation or abscess with bleeding: Principal | ICD-10-CM | POA: Diagnosis present

## 2019-07-12 DIAGNOSIS — Z96641 Presence of right artificial hip joint: Secondary | ICD-10-CM | POA: Diagnosis present

## 2019-07-12 DIAGNOSIS — Z90710 Acquired absence of both cervix and uterus: Secondary | ICD-10-CM

## 2019-07-12 DIAGNOSIS — Z853 Personal history of malignant neoplasm of breast: Secondary | ICD-10-CM

## 2019-07-12 DIAGNOSIS — D62 Acute posthemorrhagic anemia: Secondary | ICD-10-CM | POA: Diagnosis present

## 2019-07-12 DIAGNOSIS — Z9221 Personal history of antineoplastic chemotherapy: Secondary | ICD-10-CM

## 2019-07-12 DIAGNOSIS — Z881 Allergy status to other antibiotic agents status: Secondary | ICD-10-CM

## 2019-07-12 DIAGNOSIS — K922 Gastrointestinal hemorrhage, unspecified: Secondary | ICD-10-CM

## 2019-07-12 DIAGNOSIS — Z7289 Other problems related to lifestyle: Secondary | ICD-10-CM

## 2019-07-12 DIAGNOSIS — Z713 Dietary counseling and surveillance: Secondary | ICD-10-CM

## 2019-07-12 DIAGNOSIS — Z79899 Other long term (current) drug therapy: Secondary | ICD-10-CM

## 2019-07-12 DIAGNOSIS — Z20822 Contact with and (suspected) exposure to covid-19: Secondary | ICD-10-CM | POA: Diagnosis present

## 2019-07-12 DIAGNOSIS — E119 Type 2 diabetes mellitus without complications: Secondary | ICD-10-CM | POA: Diagnosis present

## 2019-07-12 LAB — HEMOGLOBIN AND HEMATOCRIT, BLOOD
HCT: 29.5 % — ABNORMAL LOW (ref 36.0–46.0)
Hemoglobin: 9.4 g/dL — ABNORMAL LOW (ref 12.0–15.0)

## 2019-07-12 LAB — PREPARE RBC (CROSSMATCH)

## 2019-07-12 LAB — COMPREHENSIVE METABOLIC PANEL
ALT: 10 U/L (ref 0–44)
AST: 15 U/L (ref 15–41)
Albumin: 3.3 g/dL — ABNORMAL LOW (ref 3.5–5.0)
Alkaline Phosphatase: 42 U/L (ref 38–126)
Anion gap: 10 (ref 5–15)
BUN: 19 mg/dL (ref 8–23)
CO2: 25 mmol/L (ref 22–32)
Calcium: 8.6 mg/dL — ABNORMAL LOW (ref 8.9–10.3)
Chloride: 106 mmol/L (ref 98–111)
Creatinine, Ser: 0.78 mg/dL (ref 0.44–1.00)
GFR calc Af Amer: >60 mL/min (ref >60)
GFR calc non Af Amer: >60 mL/min (ref >60)
Glucose, Bld: 163 mg/dL — ABNORMAL HIGH (ref 70–99)
Potassium: 4.3 mmol/L (ref 3.5–5.1)
Sodium: 141 mmol/L (ref 135–145)
Total Bilirubin: 0.3 mg/dL (ref 0.3–1.2)
Total Protein: 5.7 g/dL — ABNORMAL LOW (ref 6.5–8.1)

## 2019-07-12 LAB — GLUCOSE, CAPILLARY: Glucose-Capillary: 131 mg/dL — ABNORMAL HIGH (ref 70–99)

## 2019-07-12 LAB — CBC
HCT: 23.4 % — ABNORMAL LOW (ref 36.0–46.0)
Hemoglobin: 7.2 g/dL — ABNORMAL LOW (ref 12.0–15.0)
MCH: 25.1 pg — ABNORMAL LOW (ref 26.0–34.0)
MCHC: 30.8 g/dL (ref 30.0–36.0)
MCV: 81.5 fL (ref 80.0–100.0)
Platelets: 192 10*3/uL (ref 150–400)
RBC: 2.87 MIL/uL — ABNORMAL LOW (ref 3.87–5.11)
RDW: 16.1 % — ABNORMAL HIGH (ref 11.5–15.5)
WBC: 6.9 10*3/uL (ref 4.0–10.5)
nRBC: 0 % (ref 0.0–0.2)

## 2019-07-12 LAB — POC OCCULT BLOOD, ED: Fecal Occult Bld: POSITIVE — AB

## 2019-07-12 LAB — SARS CORONAVIRUS 2 (TAT 6-24 HRS): SARS Coronavirus 2: NEGATIVE

## 2019-07-12 MED ORDER — ACETAMINOPHEN 650 MG RE SUPP: 650.0000 mg | Freq: Four times a day (QID) | RECTAL | Status: DC | PRN

## 2019-07-12 MED ORDER — INSULIN ASPART 100 UNIT/ML ~~LOC~~ SOLN
0.0000 [IU] | Freq: Three times a day (TID) | SUBCUTANEOUS | Status: DC
  Administered 2019-07-13: 1 [IU] via SUBCUTANEOUS
  Administered 2019-07-16: 2 [IU] via SUBCUTANEOUS

## 2019-07-12 MED ORDER — ONDANSETRON HCL 4 MG/2ML IJ SOLN: 4.0000 mg | Freq: Four times a day (QID) | INTRAMUSCULAR | Status: DC | PRN

## 2019-07-12 MED ORDER — SODIUM CHLORIDE 0.9% FLUSH
3.0000 mL | Freq: Two times a day (BID) | INTRAVENOUS | Status: DC
  Administered 2019-07-12 – 2019-07-16 (×7): 3 mL via INTRAVENOUS

## 2019-07-12 MED ORDER — GABAPENTIN 300 MG PO CAPS
600.0000 mg | ORAL_CAPSULE | Freq: Every day | ORAL | Status: DC
  Administered 2019-07-12 – 2019-07-15 (×4): 600 mg via ORAL
  Filled 2019-07-12 (×5): qty 2

## 2019-07-12 MED ORDER — ALBUTEROL SULFATE (2.5 MG/3ML) 0.083% IN NEBU: 2.5000 mg | INHALATION_SOLUTION | Freq: Four times a day (QID) | RESPIRATORY_TRACT | Status: DC | PRN

## 2019-07-12 MED ORDER — ONDANSETRON HCL 4 MG PO TABS: 4.0000 mg | ORAL_TABLET | Freq: Four times a day (QID) | ORAL | Status: DC | PRN

## 2019-07-12 MED ORDER — SODIUM CHLORIDE 0.9% IV SOLUTION: Freq: Once | INTRAVENOUS | Status: AC

## 2019-07-12 MED ORDER — ACETAMINOPHEN 325 MG PO TABS: 650.0000 mg | ORAL_TABLET | Freq: Four times a day (QID) | ORAL | Status: DC | PRN

## 2019-07-12 NOTE — H&P (Signed)
History and Physical    Tami Schmidt [id number] DOB: [birth year] DOA: 07/12/2019  Referring MD/NP/PA: Earlene Plater, MD (resident) PCP: [name], MD  Patient coming from: home  Chief Complaint: Bloody stools  I have personally briefly reviewed patient's old medical records in [place]   HPI: Tami Schmidt is a 86 y.o. female with medical history significant of hypertension, hyperlipidemia, diabetes mellitus type 2, GI bleed, breast cancer s/p radiation, chemotherapy, and mastectomy presents with complaints of bloody stools.  Symptoms initially started 2 days ago having 5-6 episodes of bright red blood with clots present and minimal stool.  Patient noted that symptoms seemed to resolve on their own, but restarted this morning around 3:30 AM.  He has had 4 subsequent bowel movements.  Denies use of any blood thinners or NSAIDs.  Associated symptoms include complaints of shortness of breath with exertion.  Patient had been seen at an urgent care center on 3/3 with presumed diagnosis of diverticulitis and given prescription for antibiotics.  Patient reports that her symptoms of nausea and vomiting occurred after she received the second dose of her COVID-19 vaccine.  She did not take all of the medication that was prescribed because it seemed to make her symptoms worse.  Last EGD and colonoscopy were back in 12/2017 by Dr. Therisa Doyne for GI bleed which showed low-grade narrowing Schatzi ring, 4 cm hiatal hernia, LA grade A esophagitis, multiple polyps removed, and diverticulosis.   ED Course: Upon admission into the emergency department patient was noted to have stable vital signs.  While in the emergency department patient was noted to have an irregular heart rhythm, but patient notes that this is chronic in nature.  Labs significant for hemoglobin 7.8 (previously 14.8 on 3/4).  Stool guaiacs were noted to be positive.  Patient was ordered to be transfused 1 unit packed red  blood cells.  [facility] was consulted.  [facility] called to admit.  Review of Systems  Constitutional: Negative for chills and fever.  HENT: Negative for congestion.   Eyes: Negative for double vision and pain.  Respiratory: Positive for shortness of breath.   Cardiovascular: Positive for palpitations. Negative for chest pain and claudication.  Gastrointestinal: Positive for blood in stool. Negative for abdominal pain.  Genitourinary: Negative for dysuria and hematuria.  Musculoskeletal: Negative for falls and myalgias.  Skin: Negative for itching.  Neurological: Negative for focal weakness and loss of consciousness.  Psychiatric/Behavioral: Positive for memory loss (Mild). Negative for substance abuse.    Past Medical History:  Diagnosis Date  . Arthritis   . Breast cancer ([place])    b/l mastectomies hx  . Cancer [facility])    breast  . 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 Diabetes mellitus    fasting 90-100  . HTN (hypertension) 03/25/2013  . Hx of radiation therapy    breasts hx  . Hypercholesterolemia   . Hypertension   . Lymphedema of arm    left arm  . Type II or unspecified type diabetes mellitus without mention of complication, not stated as uncontrolled 03/25/2013    Past Surgical History:  Procedure Laterality Date  . ABDOMINAL HYSTERECTOMY    . ANKLE SURGERY Right 1995  . APPENDECTOMY    . BIOPSY  01/07/2018   Procedure: BIOPSY;  Surgeon: [name], MD;  Location: [facility] ENDOSCOPY;  Service: Gastroenterology;;  . BREAST SURGERY Bilateral    mastectomy  . COLONOSCOPY WITH PROPOFOL N/A 01/07/2018   Procedure: COLONOSCOPY WITH PROPOFOL;  Surgeon: [name], MD;  Location: [facility];  Service: Gastroenterology;  Laterality: N/A;  . ESOPHAGOGASTRODUODENOSCOPY (EGD) WITH PROPOFOL N/A 01/06/2018   Procedure: ESOPHAGOGASTRODUODENOSCOPY (EGD) WITH PROPOFOL;  Surgeon: [name], MD;  Location: [facility];  Service: Gastroenterology;  Laterality: N/A;  .  HERNIA REPAIR  04-26-2010  . MASS EXCISION Left 01/26/2013   Procedure: EXCISION LEFT CHEST WALL MASS AND LEFT ABDOMNAL WALL MASS;  Surgeon: Harl Bowie, MD;  Location: [place];  Service: General;  Laterality: Left;  Marland Kitchen MASS EXCISION Left 09/20/2014   Procedure: EXCISION OF LEFT CHEST WALL MASS;  Surgeon: Coralie Keens, MD;  Location: [place];  Service: General;  Laterality: Left;  . TEE WITHOUT CARDIOVERSION N/A 12/08/2017   Procedure: TRANSESOPHAGEAL ECHOCARDIOGRAM (TEE);  Surgeon: Lelon Perla, MD;  Location: [facility]COPY;  Service: Cardiovascular;  Laterality: N/A;  . TOTAL HIP ARTHROPLASTY Right 01/05/2017  . TOTAL HIP ARTHROPLASTY Right 01/05/2017   Procedure: TOTAL HIP ARTHROPLASTY ANTERIOR APPROACH;  Surgeon: Frederik Pear, MD;  Location: [place];  Service: Orthopedics;  Laterality: Right;     reports that she quit smoking about 47 years ago. Her smoking use included cigarettes. She has never used smokeless tobacco. She reports current alcohol use. She reports that she does not use drugs.  Allergies  Allergen Reactions  . Bactrim Swelling    SWELLING OF MOUTH/FACE.  Marland Kitchen Lisinopril Swelling    SWELLING OF MOUTH/FACE.  Marland Kitchen Vasotec Swelling    SWELLING OF MOUTH/FACE.    No family history on file.  Prior to Admission medications   Medication Sig Start Date End Date Taking? Authorizing Provider  amLODipine (NORVASC) 10 MG tablet Take 10 mg by mouth daily with breakfast.     [provider]  amoxicillin (AMOXIL) 500 MG capsule TAKE 4 CAPSULES BY MOUTH 1 HOUR PRIOR TO DENTAL APPOINTMENT 02/03/18   [provider]  ciprofloxacin (CIPRO) 100 MG tablet Take 100 mg by mouth 2 (two) times daily. 06/20/19   [provider]  diclofenac Sodium (VOLTAREN) 1 % GEL Apply  a small amount to affected area three times a day  Apply 2 g to the skin over affected area(s) 3 times daily 03/22/19   [provider]  fish oil-omega-3 fatty acids 1000 MG  capsule Take 1 g by mouth daily with breakfast.     [provider]  gabapentin (NEURONTIN) 300 MG capsule Take 300 mg by mouth 3 (three) times daily. 03/22/19   [provider]  gabapentin (NEURONTIN) 400 MG capsule Take 400 mg by mouth 3 (three) times daily. 03/22/19   [provider]  HUMULIN N 100 UNIT/ML injection Inject 40 Units into the skin daily. 11/29/16   [provider]  ibuprofen (ADVIL,MOTRIN) 600 MG tablet TAKE 1 TABLET BY MOUTH TWICE DAILY TO THREE TIMES DAILY WITH FOOD AS NEEDED FOR PAIN. 02/10/18   [provider]  meloxicam (MOBIC) 7.5 MG tablet Take 1 tablet (7.5 mg total) by mouth daily as needed for pain. 04/19/18   Hilts, Legrand Como, MD  metFORMIN (GLUCOPHAGE) 500 MG tablet Take 500 mg by mouth daily with breakfast.    [provider]  metoprolol succinate (TOPROL-XL) 25 MG 24 hr tablet  11/11/18   [provider]  metroNIDAZOLE (FLAGYL) 250 MG tablet Take 250 mg by mouth 3 (three) times daily. 06/20/19   [provider]  olmesartan-hydrochlorothiazide (BENICAR HCT) 40-12.5 MG tablet Take 1 tablet by mouth daily. 02/26/18   [provider]  ondansetron (  ZOFRAN) 4 MG tablet Take 1 tablet (4 mg total) by mouth every 6 (six) hours. 06/16/19   Darr, Marguerita Beards, PA-C  potassium chloride (KLOR-CON) 10 MEQ tablet Take 1 tablet (10 mEq total) by mouth 2 (two) times daily for 7 days. 06/16/19 06/23/19  Darr, Marguerita Beards, PA-C  Vitamin D, Ergocalciferol, (DRISDOL) 1.25 MG (50000 UT) CAPS capsule Take 50,000 Units by mouth once a week. 11/11/18   [provider]    Physical Exam:  Constitutional: NAD, calm, comfortable Vitals:   07/12/19 1052 07/12/19 1104  BP: (!) 113/55   Pulse: 95   Resp: 18   Temp: 98.4 F (36.9 C)   TempSrc: Oral   SpO2: 100%   Weight:  96.6 kg  Height:  5\' 6"  (1.676 m)   Eyes: PERRL, lids and conjunctivae normal ENMT: Mucous membranes are moist. Posterior pharynx clear of any exudate or  lesions.Normal dentition.  Neck: normal, supple, no masses, no thyromegaly Respiratory: clear to auscultation bilaterally, no wheezing, no crackles. Normal respiratory effort. No accessory muscle use.  Cardiovascular: Regular rate and rhythm, no murmurs / rubs / gallops. No extremity edema. 2+ pedal pulses. No carotid bruits.  Abdomen: no tenderness, no masses palpated. No hepatosplenomegaly. Bowel sounds positive.  Musculoskeletal: no clubbing / cyanosis. No joint deformity upper and lower extremities. Good ROM, no contractures. Normal muscle tone.  Skin: no rashes, lesions, ulcers. No induration Neurologic: CN 2-12 grossly intact. Sensation intact, DTR normal. Strength 5/5 in all 4.  Psychiatric: Normal judgment and insight. Alert and oriented x 3. Normal mood.     Labs on Admission: I have personally reviewed following labs and imaging studies  CBC: Recent Labs  Lab 07/12/19 1121  WBC 6.9  HGB 7.2*  HCT 23.4*  MCV 81.5  PLT [id number]   Basic Metabolic Panel: Recent Labs  Lab 07/12/19 1121  NA 141  K 4.3  CL 106  CO2 25  GLUCOSE 163*  BUN 19  CREATININE 0.78  CALCIUM 8.6*   GFR: Estimated Creatinine Clearance: 61.3 mL/min (by C-G formula based on SCr of 0.78 mg/dL). Liver Function Tests: Recent Labs  Lab 07/12/19 1121  AST 15  ALT 10  ALKPHOS 42  BILITOT 0.3  PROT 5.7*  ALBUMIN 3.3*   No results for input(s): LIPASE, AMYLASE in the last 168 hours. No results for input(s): AMMONIA in the last 168 hours. Coagulation Profile: No results for input(s): INR, PROTIME in the last 168 hours. Cardiac Enzymes: No results for input(s): CKTOTAL, CKMB, CKMBINDEX, TROPONINI in the last 168 hours. BNP (last 3 results) No results for input(s): PROBNP in the last 8760 hours. HbA1C: No results for input(s): HGBA1C in the last 72 hours. CBG: No results for input(s): GLUCAP in the last 168 hours. Lipid Profile: No results for input(s): CHOL, HDL, LDLCALC, TRIG, CHOLHDL,  LDLDIRECT in the last 72 hours. Thyroid Function Tests: No results for input(s): TSH, T4TOTAL, FREET4, T3FREE, THYROIDAB in the last 72 hours. Anemia Panel: No results for input(s): VITAMINB12, FOLATE, FERRITIN, TIBC, IRON, RETICCTPCT in the last 72 hours. Urine analysis:    Component Value Date/Time   COLORURINE YELLOW 12/02/2017 0615   APPEARANCEUR HAZY (A) 12/02/2017 0615   LABSPEC 1.017 12/02/2017 0615   PHURINE 6.0 12/02/2017 0615   GLUCOSEU NEGATIVE 12/02/2017 0615   HGBUR SMALL (A) 12/02/2017 0615   BILIRUBINUR NEGATIVE 12/02/2017 0615   KETONESUR NEGATIVE 12/02/2017 0615   PROTEINUR NEGATIVE 12/02/2017 0615   UROBILINOGEN 0.2 04/26/2010 0840   NITRITE NEGATIVE  12/02/2017 0615   LEUKOCYTESUR NEGATIVE 12/02/2017 0615   Sepsis Labs: No results found for this or any previous visit (from the past 240 hour(s)).   Radiological Exams on Admission: No results found.  EKG: Independently reviewed.  Sinus rhythm at 85 bpm  Assessment/Plan Acute blood loss anemia, lower GI bleed: Patient presents with complaints of 2 days of bright red blood per rectum.  Hemoglobin dropped from 14.8 on 3/4 down to 7.8 today.  She is not on any anticoagulation or endorsed use of NSAIDs.  Stool guaiacs were noted to be positive.  [facility] was consulted.  Last GI bleed occurred back in 2019 where colonoscopy noted diverticulosis and polyps.  Patient was ordered to be transfused 1 unit of packed red blood cells -Admit to a medical telemetry bed -Monitor intake and output -Clear liquid diet and n.p.o. after midnight -Transfuse blood products as needed -Appreciate GI consultative services, we will follow-up for further recommendations  Diabetes mellitus type 2: Home medications include Metformin and Humulin.  Initial blood glucose 163.  Her last hemoglobin A1c was noted to be 6 in 12/2017. -Hypoglycemic protocols -CBGs before every meal with sensitive SSI  Essential hypertension: Blood pressures are  stable.  Current home blood pressure medications include amlodipine. -Continue amlodipine   DVT prophylaxis: SCDs Code Status: Full Family Communication: Unable to reach by phone and message left for husband on voicemail Disposition Plan: Possible discharge home in 1 to 2 days Consults called: GI Admission status: Observation  Norval Morton MD [facility] Pager [phone]   If 7PM-7AM, please contact night-coverage [email or web address] Password [email or web address]  07/12/2019, 3:05 PM

## 2019-07-12 NOTE — Consult Note (Signed)
Referring Provider: Dr. Earlene Plater (ED) Primary Care Physician:  [name], MD Primary Gastroenterologist:  Althia Forts  Reason for Consultation: Rectal bleeding  HPI: Tami Schmidt is a 86 y.o. female with history of diverticulosis, breast cancer (s/p bilateral mastectomy in 2014, chemo/radiation), type 2 diabetes, and HTN presenting with rectal bleeding.  On Sunday 3/28, patient had 5 bloody bowel movements and started feeling fatigued.  The bowel movements consisted of bright red blood with little stool.  She did not see any melena.  Yesterday, 3/29, she did not have any bowel movements or rectal bleeding.  However, today 3/30, she started passing bright red blood per rectum again.  She has had four bloody bowel movements today but states she has not had any since arriving at to the ED (11:03).  Is found to be anemic with hemoglobin of 7.2, decreased from 14.8 on 06/16/2019.  She denies any abdominal pain, nausea, vomiting, hematemesis, heartburn, dysphagia, changes in appetite, with unexplained weight loss.  She denies use of anticoagulants, NSAIDs, or aspirin.  Per chart review, patient had prior episodes of rectal bleeding in September 2019 and had a colonoscopy and EGD completed at that time. Colonoscopy was pertinent for diverticulosis in the sigmoid colon, in the descending colon, in the transverse colon, at the hepatic flexure and in the cecum.  3 polyps were removed (bx: 1 benign lymphoid, 2 hyperplastic).  EGD showed low-grade narrowing/Schatzki ring, 4 cm hiatal hernia, and esophagitis.   Past Medical History:  Diagnosis Date  . Arthritis   . Breast cancer ([place])    b/l mastectomies hx  . Cancer [facility])    breast  . 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 Diabetes mellitus    fasting 90-100  . HTN (hypertension) 03/25/2013  . Hx of radiation therapy    breasts hx  . Hypercholesterolemia   . Hypertension   . Lymphedema of arm    left arm  .  Type II or unspecified type diabetes mellitus without mention of complication, not stated as uncontrolled 03/25/2013    Past Surgical History:  Procedure Laterality Date  . ABDOMINAL HYSTERECTOMY    . ANKLE SURGERY Right 1995  . APPENDECTOMY    . BIOPSY  01/07/2018   Procedure: BIOPSY;  Surgeon: [name], MD;  [facility];  Service: Gastroenterology;;  . BREAST SURGERY Bilateral    mastectomy  . COLONOSCOPY WITH PROPOFOL N/A 01/07/2018   Procedure: COLONOSCOPY WITH PROPOFOL;  Surgeon: [name], MD;  [facility];  Service: Gastroenterology;  Laterality: N/A;  . ESOPHAGOGASTRODUODENOSCOPY (EGD) WITH PROPOFOL N/A 01/06/2018   Procedure: ESOPHAGOGASTRODUODENOSCOPY (EGD) WITH PROPOFOL;  Surgeon: [name], MD;  [facility];  Service: Gastroenterology;  Laterality: N/A;  . HERNIA REPAIR  04-26-2010  . MASS EXCISION Left 01/26/2013   Procedure: EXCISION LEFT CHEST WALL MASS AND LEFT ABDOMNAL WALL MASS;  Surgeon: Harl Bowie, MD;  Location: [place];  Service: General;  Laterality: Left;  Marland Kitchen MASS EXCISION Left 09/20/2014   Procedure: EXCISION OF LEFT CHEST WALL MASS;  Surgeon: Coralie Keens, MD;  Location: [place];  Service: General;  Laterality: Left;  . TEE WITHOUT CARDIOVERSION N/A 12/08/2017   Procedure: TRANSESOPHAGEAL ECHOCARDIOGRAM (TEE);  Surgeon: Lelon Perla, MD;  Location: [facility] ENDOSCOPY;  Service: Cardiovascular;  Laterality: N/A;  . TOTAL HIP ARTHROPLASTY Right 01/05/2017  . TOTAL HIP ARTHROPLASTY Right 01/05/2017   Procedure: TOTAL HIP ARTHROPLASTY ANTERIOR APPROACH;  Surgeon: Frederik Pear, MD;  Location: [place];  Service: Orthopedics;  Laterality: Right;    Prior to Admission medications   Medication Sig Start Date End Date Taking? Authorizing Provider  amLODipine (NORVASC) 10 MG tablet Take 10 mg by mouth daily with breakfast.    Yes [provider]  diphenhydramine-acetaminophen (TYLENOL PM) 25-500 MG TABS  tablet Take 1 tablet by mouth at bedtime as needed (sleep).   Yes [provider]  gabapentin (NEURONTIN) 300 MG capsule Take 300 mg by mouth 3 (three) times daily. 03/22/19  Yes [provider]  gabapentin (NEURONTIN) 400 MG capsule Take 400 mg by mouth 3 (three) times daily. 03/22/19  Yes [provider]  HUMULIN N 100 UNIT/ML injection Inject 25-30 Units into the skin daily.  11/29/16  Yes [provider]  metFORMIN (GLUCOPHAGE) 500 MG tablet Take 500 mg by mouth daily with breakfast.   Yes [provider]  olmesartan-hydrochlorothiazide (BENICAR HCT) 40-12.5 MG tablet Take 1 tablet by mouth daily. 02/26/18  Yes [provider]  diclofenac Sodium (VOLTAREN) 1 % GEL Apply 2 g topically daily as needed (For topical arthritis.).  03/22/19   [provider]  fish oil-omega-3 fatty acids 1000 MG capsule Take 1 g by mouth daily with breakfast.     [provider]  metoprolol succinate (TOPROL-XL) 25 MG 24 hr tablet Take 25 mg by mouth daily.  11/11/18   [provider]  potassium chloride (KLOR-CON) 10 MEQ tablet Take 1 tablet (10 mEq total) by mouth 2 (two) times daily for 7 days. 06/16/19 06/23/19  Darr, Marguerita Beards, PA-C  Vitamin D, Ergocalciferol, (DRISDOL) 1.25 MG (50000 UT) CAPS capsule Take 50,000 Units by mouth once a week. 11/11/18   [provider]    Scheduled Meds: . sodium chloride flush  3 mL Intravenous Q12H   Continuous Infusions: PRN Meds:.acetaminophen **OR** acetaminophen, albuterol, ondansetron **OR** ondansetron (ZOFRAN) IV  Allergies as of 07/12/2019 - Review Complete 07/12/2019  Allergen Reaction Noted  . Bactrim Swelling 06/23/2011  . Lisinopril Swelling 06/23/2011  . Vasotec Swelling 06/23/2011    No family history on file.  Social History   Socioeconomic History  . Marital status: Married    Spouse name: Not on file  . Number of children: Not on file  . Years of education: Not on file   . Highest education level: Not on file  Occupational History  . Occupation: retired Education officer, museum  Tobacco Use  . Smoking status: Former Smoker    Types: Cigarettes    Quit date: 04/14/1972    Years since quitting: 47.2  . Smokeless tobacco: Never Used  Substance and Sexual Activity  . Alcohol use: Yes    Comment: occasional  . Drug use: No  . Sexual activity: Yes    Birth control/protection: Surgical  Other Topics Concern  . Not on file  Social History Narrative  . Not on file   Social Determinants of Health   Financial Resource Strain:   . Difficulty of Paying Living Expenses:   Food Insecurity:   . Worried About Charity fundraiser in the Last Year:   . Arboriculturist in the Last Year:   Transportation Needs:   . Film/video editor (Medical):   Marland Kitchen Lack of Transportation (Non-Medical):   Physical Activity:   . Days of Exercise per Week:   . Minutes of Exercise per Session:   Stress:   . Feeling of Stress :   Social Connections:   . Frequency of Communication with Friends  and Family:   . Frequency of Social Gatherings with Friends and Family:   . Attends Religious Services:   . Active Member of Clubs or Organizations:   . Attends Archivist Meetings:   Marland Kitchen Marital Status:   Intimate Partner Violence:   . Fear of Current or Ex-Partner:   . Emotionally Abused:   Marland Kitchen Physically Abused:   . Sexually Abused:     Review of Systems: All negative except as stated above in HPI.  Physical Exam: Vital signs: Vitals:   07/12/19 1052 07/12/19 1539  BP: (!) 113/55 133/60  Pulse: 95 82  Resp: 18 14  Temp: 98.4 F (36.9 C) 98.5 F (36.9 C)  SpO2: 100% 100%     General:   Alert,  Well-developed, well-nourished, pleasant and cooperative in NAD Head: normocephalic, atraumatic Eyes: anicteric sclera; conjunctival pallor Lungs:  Clear throughout to auscultation.   No wheezes, crackles, or rhonchi. No acute distress. Heart:  Regular rate and rhythm; no  murmurs, clicks, rubs,  or gallops. Abdomen: Soft, nontender, nondistended.  Normoactive bowel sounds.  No guarding or peritoneal signs. Rectal:  Deferred Ext: no edema Neuro: Alert and oriented Psych: Normal mood and affect  GI:  Lab Results: Recent Labs    07/12/19 1121  WBC 6.9  HGB 7.2*  HCT 23.4*  PLT 192   BMET Recent Labs    07/12/19 1121  NA 141  K 4.3  CL 106  CO2 25  GLUCOSE 163*  BUN 19  CREATININE 0.78  CALCIUM 8.6*   LFT Recent Labs    07/12/19 1121  PROT 5.7*  ALBUMIN 3.3*  AST 15  ALT 10  ALKPHOS 42  BILITOT 0.3   PT/INR No results for input(s): LABPROT, INR in the last 72 hours.   Studies/Results: No results found.  Impression/Plan: Patient is an 86 year old female with history of diverticulosis presenting with anemia and rectal bleeding.  Suspect diverticular bleeding.  Patient is currently hemodynamically stable and has not had any bleeding since 11:00 this morning. Monitor rectal bleeding.  If increased bleeding, recommend tagged red blood cell study.  Monitor H&H and transfuse as needed.   [facility] will follow.   LOS: 0 days   Salley Slaughter  07/12/2019, 3:47 PM  Questions please call [phone]

## 2019-07-12 NOTE — ED Notes (Signed)
Report attempted 

## 2019-07-12 NOTE — Plan of Care (Signed)

## 2019-07-12 NOTE — ED Provider Notes (Signed)
[name]   CSN: [id number] Arrival date & time: 07/12/19  1050     History Chief Complaint  Patient presents with  . Rectal Bleeding   Tami Schmidt is a 86 y.o. female.  Tami Schmidt is a 86 year old female with a history of breast cancer status post bilateral mastectomy in 2014 chemo and radiation, T2DM, HTN, and diverticulosis who presents today with bright red blood per rectum.  The patient states she has had several bloody bowel movements over night (at least 4). Pt denies taking any blood thinners.  The patient states that she has become short of breath with exertion and feels generally weak.  She denies having any fevers, headaches, changes in her vision, chest pain, abdominal pain, nausea or vomiting.   Of note, pt had colonoscopy with Dr. Therisa Doyne of [facility] in 2019 after a GI bleed which demonstrated some polyps, which were removed, and diverticulosis.  On 3/3, pt came tot he ED for diverticulitis and completed a 7 day course of Cipro and flagyl.        Past Medical History:  Diagnosis Date  . Arthritis   . Breast cancer ([place])    b/l mastectomies hx  . Cancer [facility])    breast  . 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 Diabetes mellitus    fasting 90-100  . HTN (hypertension) 03/25/2013  . Hx of radiation therapy    breasts hx  . Hypercholesterolemia   . Hypertension   . Lymphedema of arm    left arm  . Type II or unspecified type diabetes mellitus without mention of complication, not stated as uncontrolled 03/25/2013   Patient Active Problem List   Diagnosis Date Noted  . Microcytic anemia 01/05/2018  . Melena 01/04/2018  . GI bleed 01/04/2018  . Type 2 diabetes mellitus without complication ([place]) [id number]  . Bacteremia due to methicillin susceptible Staphylococcus aureus (MSSA) 12/02/2017  . Tenosynovitis of left wrist 12/02/2017  . Infection of left wrist ([place]) 11/30/2017  . Primary  osteoarthritis of right hip 01/05/2017  . Osteoarthritis of right hip 01/03/2017  . Lymphedema of upper extremity 01/17/2014  . Osteopenia 01/17/2014  . 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 Dilated pore of [place] 08/02/2013  . 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 Scar 08/02/2013  . 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 Type II or unspecified type diabetes mellitus without mention of complication, not stated as uncontrolled 03/25/2013  . HTN (hypertension) 03/25/2013  . Chest wall recurrence of breast cancer ([place]) 02/21/2013  . Abdominal wall mass 01/14/2013   Past Surgical History:  Procedure Laterality Date  . ABDOMINAL HYSTERECTOMY    . ANKLE SURGERY Right 1995  . APPENDECTOMY    . BIOPSY  01/07/2018   Procedure: BIOPSY;  Surgeon: [name], MD;  [facility];  Service: Gastroenterology;;  . BREAST SURGERY Bilateral    mastectomy  . COLONOSCOPY WITH PROPOFOL N/A 01/07/2018   Procedure: COLONOSCOPY WITH PROPOFOL;  Surgeon: [name], MD;  [facility];  Service: Gastroenterology;  Laterality: N/A;  . ESOPHAGOGASTRODUODENOSCOPY (EGD) WITH PROPOFOL N/A 01/06/2018   Procedure: ESOPHAGOGASTRODUODENOSCOPY (EGD) WITH PROPOFOL;  Surgeon: [name], MD;  [facility];  Service: Gastroenterology;  Laterality: N/A;  . HERNIA REPAIR  04-26-2010  . MASS EXCISION Left 01/26/2013   Procedure: EXCISION LEFT CHEST WALL MASS AND LEFT  ABDOMNAL WALL MASS;  Surgeon: Harl Bowie, MD;  Location: [place];  Service: General;  Laterality: Left;  Marland Kitchen MASS EXCISION Left 09/20/2014   Procedure: EXCISION OF LEFT CHEST WALL MASS;  Surgeon: Coralie Keens, MD;  Location: [place];  Service: General;  Laterality: Left;  . TEE WITHOUT CARDIOVERSION N/A 12/08/2017   Procedure: TRANSESOPHAGEAL ECHOCARDIOGRAM (TEE);  Surgeon: Lelon Perla, MD;  Location[facility];  Service: Cardiovascular;  Laterality: N/A;  . TOTAL HIP ARTHROPLASTY Right 01/05/2017  . TOTAL HIP ARTHROPLASTY Right 01/05/2017   Procedure: TOTAL HIP ARTHROPLASTY ANTERIOR APPROACH;  Surgeon: Frederik Pear, MD;  Location: [place];  Service: Orthopedics;  Laterality: Right;    OB History   No obstetric history on file.    No family history on file.  Social History   Tobacco Use  . Smoking status: Former Smoker    Types: Cigarettes    Quit date: 04/14/1972    Years since quitting: 47.2  . Smokeless tobacco: Never Used  Substance Use Topics  . Alcohol use: Yes    Comment: occasional  . Drug use: No   Home Medications Prior to Admission medications   Medication Sig Start Date End Date Taking? Authorizing Provider  amLODipine (NORVASC) 10 MG tablet Take 10 mg by mouth daily with breakfast.     [provider]  amoxicillin (AMOXIL) 500 MG capsule TAKE 4 CAPSULES BY MOUTH 1 HOUR PRIOR TO DENTAL APPOINTMENT 02/03/18   [provider]  ciprofloxacin (CIPRO) 100 MG tablet Take 100 mg by mouth 2 (two) times daily. 06/20/19   [provider]  diclofenac Sodium (VOLTAREN) 1 % GEL Apply  a small amount to affected area three times a day  Apply 2 g to the skin over affected area(s) 3 times daily 03/22/19   [provider]  fish oil-omega-3 fatty acids 1000 MG capsule Take 1 g by mouth daily with breakfast.     [provider]  gabapentin (NEURONTIN) 300 MG capsule Take 300 mg by mouth 3 (three) times daily. 03/22/19   [provider]  gabapentin (NEURONTIN) 400 MG capsule Take 400 mg by mouth 3 (three) times daily. 03/22/19   [provider]  HUMULIN N 100 UNIT/ML injection Inject 40 Units into the skin daily. 11/29/16   [provider]  ibuprofen (ADVIL,MOTRIN) 600 MG tablet TAKE 1 TABLET BY MOUTH TWICE DAILY TO THREE TIMES DAILY WITH FOOD AS NEEDED FOR PAIN. 02/10/18   [provider]    meloxicam (MOBIC) 7.5 MG tablet Take 1 tablet (7.5 mg total) by mouth daily as needed for pain. 04/19/18   Hilts, Legrand Como, MD  metFORMIN (GLUCOPHAGE) 500 MG tablet Take 500 mg by mouth daily with breakfast.    [provider]  metoprolol succinate (TOPROL-XL) 25 MG 24 hr tablet  11/11/18   [provider]  metroNIDAZOLE (FLAGYL) 250 MG tablet Take 250 mg by mouth 3 (three) times daily. 06/20/19   [provider]  olmesartan-hydrochlorothiazide (BENICAR HCT) 40-12.5 MG tablet Take 1 tablet by mouth daily. 02/26/18   [provider]  ondansetron (ZOFRAN) 4 MG tablet Take 1 tablet (4 mg total) by mouth every 6 (six) hours. 06/16/19   Darr, Marguerita Beards, PA-C  potassium chloride (KLOR-CON) 10 MEQ tablet Take 1 tablet (10 mEq total) by mouth 2 (two) times daily for 7 days. 06/16/19 06/23/19  Darr, Marguerita Beards, PA-C  Vitamin D, Ergocalciferol, (DRISDOL) 1.25 MG (50000 UT) CAPS capsule Take 50,000 Units  by mouth once a week. 11/11/18   [provider]   Allergies    Bactrim, Lisinopril, and Vasotec   Review of Systems   Review of Systems  All other systems reviewed and are negative.  Physical Exam Updated Vital Signs BP (!) 113/55 (BP Location: Right Arm)   Pulse 95   Temp 98.4 F (36.9 C) (Oral)   Resp 18   Ht 5\' 6"  (1.676 m)   Wt 96.6 kg   SpO2 100%   BMI 34.38 kg/m    Physical Exam Vitals reviewed.  Constitutional:      General: She is not in acute distress.    Appearance: She is obese. She is not ill-appearing, toxic-appearing or diaphoretic.  HENT:     Head: Normocephalic and atraumatic.  Cardiovascular:     Rate and Rhythm: Normal rate. Rhythm irregular.     Pulses: Normal pulses.     Heart sounds: Murmur (blowing systolic in the right upper sternal border) present. No friction rub. No gallop.   Pulmonary:     Effort: Pulmonary effort is normal. No respiratory distress.     Breath sounds: Normal breath sounds. No wheezing or rales.  Abdominal:      General: Abdomen is flat. Bowel sounds are normal. There is no distension.     Palpations: Abdomen is soft.     Tenderness: There is no abdominal tenderness.  Genitourinary:    Rectum: Guaiac result positive.     Comments: Bright red blood on digital rectal exam Musculoskeletal:     Right lower leg: Edema (1+ to the knee) present.     Left lower leg: Edema (1+ to the knee) present.  Skin:    General: Skin is warm.  Neurological:     General: No focal deficit present.     Mental Status: She is alert and oriented to person, place, and time.  Psychiatric:        Mood and Affect: Mood normal.    ED Results / Procedures / Treatments   Labs (all labs ordered are listed, but only abnormal results are displayed) Labs Reviewed  COMPREHENSIVE METABOLIC PANEL - Abnormal; Notable for the following components:      Result Value   Glucose, Bld 163 (*)    Calcium 8.6 (*)    Total Protein 5.7 (*)    Albumin 3.3 (*)    All other components within normal limits  CBC - Abnormal; Notable for the following components:   RBC 2.87 (*)    Hemoglobin 7.2 (*)    HCT 23.4 (*)    MCH 25.1 (*)    RDW 16.1 (*)    All other components within normal limits  SARS CORONAVIRUS 2 (TAT 6-24 HRS)  POC OCCULT BLOOD, ED  TYPE AND SCREEN   EKG None  Radiology No results found.  Procedures Procedures (including critical care time)  Medications Ordered in ED Medications - No data to display  ED Course  I have reviewed the triage vital signs and the nursing notes.  Pertinent labs & imaging results that were available during my care of the patient were reviewed by me and considered in my medical decision making (see chart for details).    MDM Rules/Calculators/A&P                      Tami Schmidt has a history of GI bleeding, but has not been on blood thinners.  She had a recent bout of diverticulitis earlier  this month and is now status post 7-day course of ciprofloxacin and Flagyl. She does not have  any abdominal pain or tenderness today.  She presents with rectal bleeding and bloody bowel movements.  Patient states she has had for bowel movements today and they appear to be more blood than stool.  Given the fact that the patient has symptomatic anemia with a hemoglobin of 7.2, will transfuse a unit of blood.  The patient will need an admission.  Will call GI.  Case discussed with GI. No further recommendations. At this time, and they will see the patient. Will consult medicine for admission.   Discussed the case with internal medicine, they will admit the patient.  Final Clinical Impression(s) / ED Diagnoses Final diagnoses:  None   Rx / DC Orders ED Discharge Orders    None       Earlene Plater, MD 07/12/19 1531    Blanchie Dessert, MD 07/13/19 [phone]

## 2019-07-12 NOTE — ED Notes (Signed)
Pt would like to take gabapentin closer to bedtime.

## 2019-07-12 NOTE — ED Notes (Signed)
Dinner Tray Ordered @ 1817. 

## 2019-07-12 NOTE — ED Triage Notes (Signed)
Pt endorses bright red blood in her stool today. 4 episodes since 4 am today. Endorses weakness but no pain. Not on blood thinners.

## 2019-07-13 DIAGNOSIS — Z96641 Presence of right artificial hip joint: Secondary | ICD-10-CM | POA: Diagnosis present

## 2019-07-13 DIAGNOSIS — K922 Gastrointestinal hemorrhage, unspecified: Secondary | ICD-10-CM | POA: Diagnosis present

## 2019-07-13 DIAGNOSIS — Z888 Allergy status to other drugs, medicaments and biological substances status: Secondary | ICD-10-CM | POA: Diagnosis not present

## 2019-07-13 DIAGNOSIS — Z881 Allergy status to other antibiotic agents status: Secondary | ICD-10-CM | POA: Diagnosis not present

## 2019-07-13 DIAGNOSIS — Z87891 Personal history of nicotine dependence: Secondary | ICD-10-CM | POA: Diagnosis not present

## 2019-07-13 DIAGNOSIS — Z853 Personal history of malignant neoplasm of breast: Secondary | ICD-10-CM | POA: Diagnosis not present

## 2019-07-13 DIAGNOSIS — Z794 Long term (current) use of insulin: Secondary | ICD-10-CM | POA: Diagnosis not present

## 2019-07-13 DIAGNOSIS — Z79899 Other long term (current) drug therapy: Secondary | ICD-10-CM | POA: Diagnosis not present

## 2019-07-13 DIAGNOSIS — Z7289 Other problems related to lifestyle: Secondary | ICD-10-CM | POA: Diagnosis not present

## 2019-07-13 DIAGNOSIS — Z923 Personal history of irradiation: Secondary | ICD-10-CM | POA: Diagnosis not present

## 2019-07-13 DIAGNOSIS — Z713 Dietary counseling and surveillance: Secondary | ICD-10-CM | POA: Diagnosis not present

## 2019-07-13 DIAGNOSIS — Z90710 Acquired absence of both cervix and uterus: Secondary | ICD-10-CM | POA: Diagnosis not present

## 2019-07-13 DIAGNOSIS — K5733 Diverticulitis of large intestine without perforation or abscess with bleeding: Secondary | ICD-10-CM | POA: Diagnosis present

## 2019-07-13 DIAGNOSIS — Z9013 Acquired absence of bilateral breasts and nipples: Secondary | ICD-10-CM | POA: Diagnosis not present

## 2019-07-13 DIAGNOSIS — Z20822 Contact with and (suspected) exposure to covid-19: Secondary | ICD-10-CM | POA: Diagnosis present

## 2019-07-13 DIAGNOSIS — Z9221 Personal history of antineoplastic chemotherapy: Secondary | ICD-10-CM | POA: Diagnosis not present

## 2019-07-13 DIAGNOSIS — E669 Obesity, unspecified: Secondary | ICD-10-CM | POA: Diagnosis present

## 2019-07-13 DIAGNOSIS — D62 Acute posthemorrhagic anemia: Secondary | ICD-10-CM | POA: Diagnosis present

## 2019-07-13 DIAGNOSIS — Z6834 Body mass index (BMI) 34.0-34.9, adult: Secondary | ICD-10-CM | POA: Diagnosis not present

## 2019-07-13 DIAGNOSIS — E119 Type 2 diabetes mellitus without complications: Secondary | ICD-10-CM | POA: Diagnosis present

## 2019-07-13 DIAGNOSIS — I10 Essential (primary) hypertension: Secondary | ICD-10-CM | POA: Diagnosis present

## 2019-07-13 LAB — CBC
HCT: 23.9 % — ABNORMAL LOW (ref 36.0–46.0)
Hemoglobin: 7.6 g/dL — ABNORMAL LOW (ref 12.0–15.0)
MCH: 26.4 pg (ref 26.0–34.0)
MCHC: 31.8 g/dL (ref 30.0–36.0)
MCV: 83 fL (ref 80.0–100.0)
Platelets: 161 10*3/uL (ref 150–400)
RBC: 2.88 MIL/uL — ABNORMAL LOW (ref 3.87–5.11)
RDW: 16 % — ABNORMAL HIGH (ref 11.5–15.5)
WBC: 6.9 10*3/uL (ref 4.0–10.5)
nRBC: 0 % (ref 0.0–0.2)

## 2019-07-13 LAB — GLUCOSE, CAPILLARY
Glucose-Capillary: 99 mg/dL (ref 70–99)
Glucose-Capillary: 95 mg/dL (ref 70–99)
Glucose-Capillary: 123 mg/dL — ABNORMAL HIGH (ref 70–99)

## 2019-07-13 LAB — BASIC METABOLIC PANEL
Anion gap: 7 (ref 5–15)
BUN: 13 mg/dL (ref 8–23)
CO2: 26 mmol/L (ref 22–32)
Calcium: 8.2 mg/dL — ABNORMAL LOW (ref 8.9–10.3)
Chloride: 107 mmol/L (ref 98–111)
Creatinine, Ser: 0.6 mg/dL (ref 0.44–1.00)
GFR calc Af Amer: >60 mL/min (ref >60)
GFR calc non Af Amer: >60 mL/min (ref >60)
Glucose, Bld: 116 mg/dL — ABNORMAL HIGH (ref 70–99)
Potassium: 3.6 mmol/L (ref 3.5–5.1)
Sodium: 140 mmol/L (ref 135–145)

## 2019-07-13 LAB — HEMOGLOBIN A1C
Hgb A1c MFr Bld: 6.1 % — ABNORMAL HIGH (ref 4.8–5.6)
Mean Plasma Glucose: 128.37 mg/dL

## 2019-07-13 LAB — HEMOGLOBIN AND HEMATOCRIT, BLOOD
HCT: 26.7 % — ABNORMAL LOW (ref 36.0–46.0)
Hemoglobin: 8.4 g/dL — ABNORMAL LOW (ref 12.0–15.0)

## 2019-07-13 NOTE — Progress Notes (Signed)
Patient passed a medium size bloody stool. Dark red with some small clots. Patient reports no complaints. GI paged and made aware.

## 2019-07-13 NOTE — Progress Notes (Signed)
[facility] Gastroenterology Progress Note  Tami Schmidt 86 y.o. Jun 05, 1932 Patient is an 86 year old female with suspected diverticular bleed.  Subjective: Patient denies any abdominal pain.  She states she had one bowel movement today, mostly consisting of blood.  She noted the blood was darker red than prior bowel movements.  She denies any abdominal pain, nausea, vomiting.  Per RN, patient passed dark red blood with some clots today (as noted above).  No other bowel movement since admission yesterday.  Objective: Vital signs: Vitals:   07/12/19 2152 07/13/19 0849  BP: (!) 168/75 (!) 111/42  Pulse: 84 80  Resp: 18 18  Temp: 99 F (37.2 C) 98.2 F (36.8 C)  SpO2: 100% 98%    Physical Exam: Gen: Sleepy, lying in bed in no acute distress  HEENT: anicteric sclera CV: RRR Chest: CTA B Abd: Soft, nontender, nondistended.  Normal active bowel sounds.  No guarding or peritoneal signs. Ext: no edema  Lab Results: Recent Labs    07/12/19 1121 07/13/19 0253  NA 141 140  K 4.3 3.6  CL 106 107  CO2 25 26  GLUCOSE 163* 116*  BUN 19 13  CREATININE 0.78 0.60  CALCIUM 8.6* 8.2*   Recent Labs    07/12/19 1121  AST 15  ALT 10  ALKPHOS 42  BILITOT 0.3  PROT 5.7*  ALBUMIN 3.3*   Recent Labs    07/12/19 1121 07/12/19 1121 07/12/19 2159 07/13/19 0253  WBC 6.9  --   --  6.9  HGB 7.2*   < > 9.4* 7.6*  HCT 23.4*   < > 29.5* 23.9*  MCV 81.5  --   --  83.0  PLT 192  --   --  161   < > = values in this interval not displayed.   Assessment/Plan: Patient is an 86 year old female with suspected diverticular bleed.  She is currently hemodynamically stable and is only had 1 bloody bowel movement over the last 24 hours.  Recommend monitoring for further rectal bleeding.  If patient starts having increased bleeding, recommend nuclear bleeding scan.  If this is positive, angiography by IR may be needed.  Salley Slaughter 07/13/2019, 12:51 PM  Questions please call  [phone]

## 2019-07-13 NOTE — Plan of Care (Signed)

## 2019-07-13 NOTE — Progress Notes (Signed)
Per Central Telemetry, patient converted into Afib @ 2146 with HR sustained in the 110s. RN checked on patient and patient was ambulating to the bathroom, asymptomatic. Patient converted back to NSR @ 2155 with HR in the 80s sustained. [facility] Night-Coverage was paged.

## 2019-07-13 NOTE — Progress Notes (Signed)
PROGRESS NOTE  Tami Schmidt [id number] DOB: 1932/11/13 DOA: 07/12/2019 PCP: [name], MD   LOS: 0 days   Brief narrative: As per HPI,  Tami Schmidt is a 86 y.o. female with medical history significant of hypertension, hyperlipidemia, diabetes mellitus type 2, GI bleed, breast cancer s/p radiation, chemotherapy, and mastectomy presented to the hospital with  complaints of bloody stools.  Symptoms initially started 2 days ago having 5-6 episodes of bright red blood with clots present and minimal stool.  Patient noted that symptoms seemed to resolve on their own, but restarted so she decided to come to the hospital.   Patient had been seen at an urgent care center on 3/3 with presumed diagnosis of diverticulitis and given prescription for antibiotics. Last EGD and colonoscopy were back in 12/2017 by Dr. Therisa Doyne for GI bleed which showed low-grade narrowing Schatzi ring, 4 cm hiatal hernia, LA grade A esophagitis, multiple polyps removed, and diverticulosis. ED Course: Upon admission into the emergency department patient was noted to have stable vital signs.  While in the emergency department patient, patient was noted to have an irregular heart rhythm, but patient notes that this is chronic in nature.  Labs significant for hemoglobin 7.8 (previously 14.8 on 3/4).  Stool guaiacs were noted to be positive.  Patient  was transfused 1 unit packed red blood cells.  [facility] was consulted and patient was admitted to hospital.    Assessment/Plan:  Principal Problem:   Lower GI bleed Active Problems:   Essential hypertension   Type 2 diabetes mellitus without complication (HCC)   Acute blood loss anemia  Acute blood loss anemia, lower GI bleed: Likely from diverticular bleed.  Patient did have a colonoscopy in the past and GI is on board.  GI recommends nuclear scan if further bleeding and possible angiogram, IR intervention if continues to bleed.  Patient presents with complaints of  2 days of bright red blood per rectum.  Hemoglobin dropped from 14.8 on 3/4 down to 7.8 .  After 1 unit of packed RBC hemoglobin improved to  9.4 followed by 7.6 again.  We will continue to monitor hemoglobin.  Transfuse as necessary.  Has been started on clears at this time.  Check H&H every 12 H.  Diabetes mellitus type 2: On Metformin and Humulin at home..  Initial blood glucose 163.   hemoglobin A1c of 6 in 12/2017.  Continue sliding scale insulin Accu-Cheks  Essential hypertension: Continue amlodipine.  Last blood pressure of 111/42  VTE Prophylaxis: SCD due to GI bleed  Code Status: Full code  Family Communication: Spoke with the patient at bedside.  Disposition Plan:  . Patient is from home . Likely disposition to home in 1 to 2 days. . Barriers to discharge: Ongoing GI bleed, needing further observation in the hospital, possible RBC tagged scan-IR intervention if patient continues to bleed.  We will change the patient's respiratory screen patient at this time.  Check hemoglobin every 12 H  Consultants:  GI  Procedures: Transfusion of packed RBC 1 unit  Antibiotics:  . None  Anti-infectives (From admission, onward)   None     Subjective:  Today, patient was seen and examined at bedside.  Denies any abdominal pain, nausea or vomiting.  Had 1 bowel movement which was bright red blood but little darker than previous bowel movements.  No chest pain, shortness of breath, dizziness.   Objective: Vitals:   07/12/19 2152 07/13/19 0849  BP: (!) 168/75 (!) 111/42  Pulse: 84 80  Resp: 18 18  Temp: 99 F (37.2 C) 98.2 F (36.8 C)  SpO2: 100% 98%    Intake/Output Summary (Last 24 hours) at 07/13/2019 1402 Last data filed at 07/12/2019 2151 Gross per 24 hour  Intake 633 ml  Output --  Net 633 ml   Filed Weights   07/12/19 1104  Weight: 96.6 kg   Body mass index is 34.38 kg/m.   Physical Exam:  GENERAL: Patient is alert awake and oriented. Not in obvious  distress.  Obese HENT: Mild pallor noted, pupils equally reactive to light. Oral mucosa is moist NECK: is supple, no gross swelling noted. CHEST: Clear to auscultation. No crackles or wheezes.  Diminished breath sounds bilaterally. CVS: S1 and S2 heard, no murmur. Regular rate and rhythm.  ABDOMEN: Soft, non-tender, bowel sounds are present. EXTREMITIES: No edema. CNS: Cranial nerves are intact. No focal motor deficits. SKIN: warm and dry without rashes.  Data Review: I have personally reviewed the following laboratory data and studies,  CBC: Recent Labs  Lab 07/12/19 1121 07/12/19 2159 07/13/19 0253  WBC 6.9  --  6.9  HGB 7.2* 9.4* 7.6*  HCT 23.4* 29.5* 23.9*  MCV 81.5  --  83.0  PLT 192  --  [id number]   Basic Metabolic Panel: Recent Labs  Lab 07/12/19 1121 07/13/19 0253  NA 141 140  K 4.3 3.6  CL 106 107  CO2 25 26  GLUCOSE 163* 116*  BUN 19 13  CREATININE 0.78 0.60  CALCIUM 8.6* 8.2*   Liver Function Tests: Recent Labs  Lab 07/12/19 1121  AST 15  ALT 10  ALKPHOS 42  BILITOT 0.3  PROT 5.7*  ALBUMIN 3.3*   No results for input(s): LIPASE, AMYLASE in the last 168 hours. No results for input(s): AMMONIA in the last 168 hours. Cardiac Enzymes: No results for input(s): CKTOTAL, CKMB, CKMBINDEX, TROPONINI in the last 168 hours. BNP (last 3 results) No results for input(s): BNP in the last 8760 hours.  ProBNP (last 3 results) No results for input(s): PROBNP in the last 8760 hours.  CBG: Recent Labs  Lab 07/12/19 2155 07/13/19 0846 07/13/19 1236  GLUCAP 131* 99 95   Recent Results (from the past 240 hour(s))  SARS CORONAVIRUS 2 (TAT 6-24 HRS) Nasopharyngeal Nasopharyngeal Swab     Status: None   Collection Time: 07/12/19  2:17 PM   Specimen: Nasopharyngeal Swab  Result Value Ref Range Status   SARS Coronavirus 2 NEGATIVE NEGATIVE Final    Comment: (NOTE) SARS-CoV-2 target nucleic acids are NOT DETECTED. The SARS-CoV-2 RNA is generally detectable in upper  and lower respiratory specimens during the acute phase of infection. Negative results do not preclude SARS-CoV-2 infection, do not rule out co-infections with other pathogens, and should not be used as the sole basis for treatment or other patient management decisions. Negative results must be combined with clinical observations, patient history, and epidemiological information. The expected result is Negative. Fact Sheet for Patients: SugarRoll.be Fact Sheet for Healthcare Providers: https://www.woods-mathews.com/ This test is not yet approved or cleared by the [place] FDA and  has been authorized for detection and/or diagnosis of SARS-CoV-2 by FDA under an Emergency Use Authorization (EUA). This EUA will remain  in effect (meaning this test can be used) for the duration of the COVID-19 declaration under Section 56 4(b)(1) of the Act, 21 U.S.C. section 360bbb-3(b)(1), unless the authorization is terminated or revoked sooner. Performed at [facility], [place] [address],  [place], [place] [address]      Studies: No results found.    Flora Lipps, MD  [facility] 07/13/2019

## 2019-07-13 NOTE — Progress Notes (Signed)
Patient seen and discussed with our PA and case discussed with her husband as well and she has not had any further bleeding today she has no other complaints and if she has no further bleeding overnight we can advance diet tomorrow and hopefully she will be able to go home soon and we again answered all her questions about diverticular bleeding diet at home etc. and will check on tomorrow

## 2019-07-14 ENCOUNTER — Inpatient Hospital Stay (HOSPITAL_COMMUNITY): Payer: Medicare Other

## 2019-07-14 ENCOUNTER — Inpatient Hospital Stay: Payer: Self-pay

## 2019-07-14 LAB — HEMOGLOBIN AND HEMATOCRIT, BLOOD
HCT: 22.6 % — ABNORMAL LOW (ref 36.0–46.0)
HCT: 23.7 % — ABNORMAL LOW (ref 36.0–46.0)
Hemoglobin: 7.1 g/dL — ABNORMAL LOW (ref 12.0–15.0)
Hemoglobin: 7.3 g/dL — ABNORMAL LOW (ref 12.0–15.0)

## 2019-07-14 LAB — GLUCOSE, CAPILLARY
Glucose-Capillary: 139 mg/dL — ABNORMAL HIGH (ref 70–99)
Glucose-Capillary: 106 mg/dL — ABNORMAL HIGH (ref 70–99)
Glucose-Capillary: 167 mg/dL — ABNORMAL HIGH (ref 70–99)

## 2019-07-14 LAB — BASIC METABOLIC PANEL
Anion gap: 9 (ref 5–15)
BUN: 10 mg/dL (ref 8–23)
CO2: 25 mmol/L (ref 22–32)
Calcium: 8.3 mg/dL — ABNORMAL LOW (ref 8.9–10.3)
Chloride: 108 mmol/L (ref 98–111)
Creatinine, Ser: 0.65 mg/dL (ref 0.44–1.00)
GFR calc Af Amer: >60 mL/min (ref >60)
GFR calc non Af Amer: >60 mL/min (ref >60)
Glucose, Bld: 107 mg/dL — ABNORMAL HIGH (ref 70–99)
Potassium: 3.5 mmol/L (ref 3.5–5.1)
Sodium: 142 mmol/L (ref 135–145)

## 2019-07-14 LAB — MAGNESIUM: Magnesium: 2 mg/dL (ref 1.7–2.4)

## 2019-07-14 LAB — PREPARE RBC (CROSSMATCH)

## 2019-07-14 MED ORDER — SODIUM CHLORIDE 0.9% FLUSH: 10.0000 mL | INTRAVENOUS | Status: DC | PRN

## 2019-07-14 MED ORDER — SODIUM CHLORIDE 0.9% IV SOLUTION: Freq: Once | INTRAVENOUS | Status: AC

## 2019-07-14 MED ORDER — TECHNETIUM TC 99M-LABELED RED BLOOD CELLS IV KIT
25.0000 | PACK | Freq: Once | INTRAVENOUS | Status: AC | PRN
  Administered 2019-07-14: 25 via INTRAVENOUS

## 2019-07-14 NOTE — Progress Notes (Signed)
1125 Patient had a third bloody bowel movement. Measure to 447ml. Patient reported feeling dizzy and light headed.  IV access at this time was obtained by IV team nurse.    MD Kendal Hymen messaged and made aware of recent patient findings. Vitals taken at 1142. BP lower than this morning. Given the OK to infuse the blood product in hour.   [place] called and notified about new IV access. Pt is set for 1245.   1155 GI Baron-Johnson PA made aware of new patient findings Instructed to continue monitor patient and notify for any changes.

## 2019-07-14 NOTE — Progress Notes (Signed)
Spoke to [place], Therapist, sports, who stated patient has had a third bloody bowel movement.  Bowel movement was measured and found to be 428mls.  Patient also complained of dizziness at that time.  Blood pressure decreased from 127/60 to 114/34.  IV access has been established and patient is currently being transfused.  RN talked to nuclear medicine and they plan to transfer patient down for bleeding scan once unit of blood is been transfused (approximately 12:45 PM).  Patient currently stable during transfusion.

## 2019-07-14 NOTE — Progress Notes (Signed)
PROGRESS NOTE  Tami Schmidt [id number] DOB: 1932-07-06 DOA: 07/12/2019 PCP: [name], MD   LOS: 1 day   Brief narrative: Tami Schmidt is a 86 y.o. female with medical history significant of hypertension, hyperlipidemia, diabetes mellitus type 2, GI bleed, breast cancer s/p radiation, chemotherapy, and mastectomy presented to the hospital with complaints of bloody stools.  Symptoms initially started 2 days ago having 5-6 episodes of bright red blood with clots present and minimal stool.  Patient noted that symptoms seemed to resolve on their own, but restarted so she decided to come to the hospital.   Patient had been seen at an urgent care center on 3/3 with presumed diagnosis of diverticulitis and given prescription for antibiotics. Last EGD and colonoscopy were back in 12/2017 by Dr. Therisa Doyne for GI bleed which showed low-grade narrowing Schatzi ring, 4 cm hiatal hernia, LA grade A esophagitis, multiple polyps removed, and diverticulosis. Upon admission into the emergency department patient was noted to have stable vital signs. While in the ED, patient was noted to have an irregular heart rhythm, but patient notes that this is chronic in nature.  Labs significant for hemoglobin 7.8 (previously 14.8 on 3/4).  Stool guaiacs were noted to be positive.  Patient was transfused 1 unit packed red blood cells.  [facility] was consulted and patient was admitted.      Assessment/Plan:  Principal Problem:   Lower GI bleed Active Problems:   Essential hypertension   Type 2 diabetes mellitus without complication (HCC)   Acute blood loss anemia   GI bleed  Acute blood loss anemia, likely lower GI bleed Possibly from diverticular bleed Hemoglobin dropped from 14.8 on 3/4 down to 7.8-->1 unit of PRBC-->9.4, now down to 7.1 with active bleed, s/p another 1U of PRBC on 07/14/19  [facility] on board, recommends nuclear scan if further bleeding and possible IR angiogram intervention if  continues to bleed Plan for nuclear scan 04/18/19 Frequent CBC checks  Diabetes mellitus type 2 Hemoglobin A1c 6.1, controlled SSI, Accu-Cheks, hypoglycemic protocol On Metformin and Humulin at home  Essential hypertension Stable Hold home amlodipine for now Monitor closely  Obesity Lifestyle modification advised      VTE Prophylaxis: SCD due to GI bleed  Code Status: Full code  Family Communication: Discussed extensively with patient  Disposition Plan:  . Patient is from home . Likely disposition to home in 1 to 2 days. . Barriers to discharge: Ongoing GI bleed, needing further observation in the hospital, pending RBC tagged scan-IR intervention if patient continues to bleed  Consultants:  GI  Procedures: Nuclear scan  Antibiotics:  . None  Anti-infectives (From admission, onward)   None     Subjective: Today, patient denies any abdominal pain, rectal pain, chest pain, shortness of breath, nausea/vomiting, fever/chills.  Patient reported some dizziness after 3 painless bloody BMs, last measuring about 400 mls mixed with clots.  Patient was scheduled to be transfused 1 unit of PRBC early this morning, but lost IV access and has been a very difficult stick.  Patient regained assess and was transfused 1U.   Objective: Vitals:   07/14/19 1200 07/14/19 1227  BP: (!) 138/51 (!) 149/59  Pulse:  70  Resp:  20  Temp: 99 F (37.2 C) 98.9 F (37.2 C)  SpO2: 98% 99%    Intake/Output Summary (Last 24 hours) at 07/14/2019 1250 Last data filed at 07/14/2019 1125 Gross per 24 hour  Intake 118 ml  Output 400 ml  Net -  282 ml   Filed Weights   07/12/19 1104  Weight: 96.6 kg   Body mass index is 34.38 kg/m.   Physical Exam:  General: NAD   Cardiovascular: S1, S2 present  Respiratory: CTAB  Abdomen: Soft, nontender, nondistended, bowel sounds present  Musculoskeletal: No bilateral pedal edema noted  Skin: Normal  Psychiatry: Normal mood    Data  Review: I have personally reviewed the following laboratory data and studies,  CBC: Recent Labs  Lab 07/12/19 1121 07/12/19 2159 07/13/19 0253 07/13/19 1432 07/14/19 0538  WBC 6.9  --  6.9  --   --   HGB 7.2* 9.4* 7.6* 8.4* 7.1*  HCT 23.4* 29.5* 23.9* 26.7* 22.6*  MCV 81.5  --  83.0  --   --   PLT 192  --  161  --   --    Basic Metabolic Panel: Recent Labs  Lab 07/12/19 1121 07/13/19 0253 07/14/19 0538  NA 141 140 142  K 4.3 3.6 3.5  CL 106 107 108  CO2 25 26 25   GLUCOSE 163* 116* 107*  BUN 19 13 10   CREATININE 0.78 0.60 0.65  CALCIUM 8.6* 8.2* 8.3*  MG  --   --  2.0   Liver Function Tests: Recent Labs  Lab 07/12/19 1121  AST 15  ALT 10  ALKPHOS 42  BILITOT 0.3  PROT 5.7*  ALBUMIN 3.3*   No results for input(s): LIPASE, AMYLASE in the last 168 hours. No results for input(s): AMMONIA in the last 168 hours. Cardiac Enzymes: No results for input(s): CKTOTAL, CKMB, CKMBINDEX, TROPONINI in the last 168 hours. BNP (last 3 results) No results for input(s): BNP in the last 8760 hours.  ProBNP (last 3 results) No results for input(s): PROBNP in the last 8760 hours.  CBG: Recent Labs  Lab 07/13/19 0846 07/13/19 1236 07/13/19 1713 07/13/19 2049 07/14/19 0743  GLUCAP 99 95 123* 139* 106*   Recent Results (from the past 240 hour(s))  SARS CORONAVIRUS 2 (TAT 6-24 HRS) Nasopharyngeal Nasopharyngeal Swab     Status: None   Collection Time: 07/12/19  2:17 PM   Specimen: Nasopharyngeal Swab  Result Value Ref Range Status   SARS Coronavirus 2 NEGATIVE NEGATIVE Final    Comment: (NOTE) SARS-CoV-2 target nucleic acids are NOT DETECTED. The SARS-CoV-2 RNA is generally detectable in upper and lower respiratory specimens during the acute phase of infection. Negative results do not preclude SARS-CoV-2 infection, do not rule out co-infections with other pathogens, and should not be used as the sole basis for treatment or other patient management decisions. Negative  results must be combined with clinical observations, patient history, and epidemiological information. The expected result is Negative. Fact Sheet for Patients: SugarRoll.be Fact Sheet for Healthcare Providers: https://www.woods-mathews.com/ This test is not yet approved or cleared by the [place] FDA and  has been authorized for detection and/or diagnosis of SARS-CoV-2 by FDA under an Emergency Use Authorization (EUA). This EUA will remain  in effect (meaning this test can be used) for the duration of the COVID-19 declaration under Section 56 4(b)(1) of the Act, 21 U.S.C. section 360bbb-3(b)(1), unless the authorization is terminated or revoked sooner. Performed at [facility], [place] [address], [place], [place] [address]      Studies: [place] EKG SITE RITE  Result Date: 07/14/2019 If Site Rite image not attached, placement could not be confirmed due to current cardiac rhythm.     Alma Friendly, MD  [facility] 07/14/2019

## 2019-07-14 NOTE — Progress Notes (Signed)
Patient's IV was flushed and NS KVO started at 0824. No complaints of pain or swelling. Blood product started at  0832 through peripheral IV in right arm.  Blood ran estimated 20 mins and patient reported some pain in her arm. Arm was sensitive to touch with some swelling. IV was then removed and IV team consulted.   Midline placed by IV team at 1000.  Midline was flushed but no blood return noted. Blood product restarted and patient again complained about the pain in her arm. Arm showed some mild swelling. Blood product stopped at 1010. MD Regan Lemming, N made aware of failed attempted for blood product transfusion. MD also made aware of a second small bloody bowel movement. IV team also contacted to reestablish IV access.

## 2019-07-14 NOTE — Progress Notes (Signed)
Tami Schmidt 1:13 PM  Subjective: Patient had 3 bloody bowel movements this morning without any other new complaints we again discussed the diagnosis and the plan and answered all of her questions  Objective: Vital signs stable afebrile no acute distress abdomen is soft nontender hemoglobin slight drop  Assessment: Probable diverticular bleeding  Plan: Stat nuclear bleeding scan if positive consult IR otherwise we will check on later today and please call us sooner if question or problem  [facility]  office [phone] After 5PM or if no answer call [phone]

## 2019-07-14 NOTE — Progress Notes (Signed)
Contacted Blood bank and informed them of the loss of access. I was instructed to discard the blood product if unable to establish access within the 4 hour limit.

## 2019-07-14 NOTE — Progress Notes (Signed)
[facility] Progress Note  Tami Schmidt 86 y.o. 1932/08/15   Subjective: Patient was doing well overnight, but then this morning, she had a large bowel movement that consisted mostly of bright red blood.  He also feels like she is going to have another bowel movement soon.  She denies abdominal pain, nausea, vomiting.  Objective: Vital signs: Vitals:   07/14/19 0826 07/14/19 0848  BP: (!) 128/59 127/60  Pulse: 75   Resp: 19 20  Temp: 98.6 F (37 C) 98.8 F (37.1 C)  SpO2: 99% 100%    Physical Exam: Gen: alert, no acute distress  HEENT: anicteric sclera; conjunctival pallor CV: RRR Chest: CTA B Abd: Soft, nontender, nondistended.  Normoactive bowel sounds.  No guarding or peritoneal signs. Ext: no edema  Lab Results: Recent Labs    07/13/19 0253 07/14/19 0538  NA 140 142  K 3.6 3.5  CL 107 108  CO2 26 25  GLUCOSE 116* 107*  BUN 13 10  CREATININE 0.60 0.65  CALCIUM 8.2* 8.3*  MG  --  2.0   Recent Labs    07/12/19 1121  AST 15  ALT 10  ALKPHOS 42  BILITOT 0.3  PROT 5.7*  ALBUMIN 3.3*   Recent Labs    07/12/19 1121 07/12/19 2159 07/13/19 0253 07/13/19 0253 07/13/19 1432 07/14/19 0538  WBC 6.9  --  6.9  --   --   --   HGB 7.2*   < > 7.6*   < > 8.4* 7.1*  HCT 23.4*   < > 23.9*   < > 26.7* 22.6*  MCV 81.5  --  83.0  --   --   --   PLT 192  --  161  --   --   --    < > = values in this interval not displayed.      Assessment/Plan: Patient with suspected diverticular bleeding.  This morning, she had another bloody bowel movement, and her hemoglobin decreased to 7.1 from 8.4 yesterday.  We will proceed with nuclear medicine bleeding scan.  If positive, consider IR intervention.  Salley Slaughter 07/14/2019, 9:15 AM  Questions please call [phone]

## 2019-07-14 NOTE — Progress Notes (Signed)
Patient blood transfusion completed.

## 2019-07-15 LAB — CBC
HCT: 21.8 % — ABNORMAL LOW (ref 36.0–46.0)
HCT: 32.7 % — ABNORMAL LOW (ref 36.0–46.0)
Hemoglobin: 7 g/dL — ABNORMAL LOW (ref 12.0–15.0)
Hemoglobin: 10.8 g/dL — ABNORMAL LOW (ref 12.0–15.0)
MCH: 27.3 pg (ref 26.0–34.0)
MCH: 28.3 pg (ref 26.0–34.0)
MCHC: 32.1 g/dL (ref 30.0–36.0)
MCHC: 33 g/dL (ref 30.0–36.0)
MCV: 85.2 fL (ref 80.0–100.0)
MCV: 85.6 fL (ref 80.0–100.0)
Platelets: 180 10*3/uL (ref 150–400)
Platelets: 188 10*3/uL (ref 150–400)
RBC: 2.56 MIL/uL — ABNORMAL LOW (ref 3.87–5.11)
RBC: 3.82 MIL/uL — ABNORMAL LOW (ref 3.87–5.11)
RDW: 16.5 % — ABNORMAL HIGH (ref 11.5–15.5)
RDW: 15.5 % (ref 11.5–15.5)
WBC: 7.3 10*3/uL (ref 4.0–10.5)
WBC: 9.7 10*3/uL (ref 4.0–10.5)
nRBC: 0 % (ref 0.0–0.2)
nRBC: 0 % (ref 0.0–0.2)

## 2019-07-15 LAB — GLUCOSE, CAPILLARY
Glucose-Capillary: 94 mg/dL (ref 70–99)
Glucose-Capillary: 101 mg/dL — ABNORMAL HIGH (ref 70–99)
Glucose-Capillary: 96 mg/dL (ref 70–99)
Glucose-Capillary: 148 mg/dL — ABNORMAL HIGH (ref 70–99)

## 2019-07-15 LAB — PREPARE RBC (CROSSMATCH)

## 2019-07-15 MED ORDER — SODIUM CHLORIDE 0.9% IV SOLUTION: Freq: Once | INTRAVENOUS | Status: AC

## 2019-07-15 NOTE — Progress Notes (Signed)
PROGRESS NOTE  Tami DIPIETRANTONIO [id number] DOB: 02/01/1933 DOA: 07/12/2019 PCP: [name], MD   LOS: 2 days   Brief narrative: Tami Schmidt is a 86 y.o. female with medical history significant of hypertension, hyperlipidemia, diabetes mellitus type 2, GI bleed, breast cancer s/p radiation, chemotherapy, and mastectomy presented to the hospital with complaints of bloody stools.  Symptoms initially started 2 days ago having 5-6 episodes of bright red blood with clots present and minimal stool.  Patient noted that symptoms seemed to resolve on their own, but restarted so she decided to come to the hospital.   Patient had been seen at an urgent care center on 3/3 with presumed diagnosis of diverticulitis and given prescription for antibiotics. Last EGD and colonoscopy were back in 12/2017 by Dr. Therisa Doyne for GI bleed which showed low-grade narrowing Schatzi ring, 4 cm hiatal hernia, LA grade A esophagitis, multiple polyps removed, and diverticulosis. Upon admission into the emergency department patient was noted to have stable vital signs. While in the ED, patient was noted to have an irregular heart rhythm, but patient notes that this is chronic in nature.  Labs significant for hemoglobin 7.8 (previously 14.8 on 3/4).  Stool guaiacs were noted to be positive.  Patient was transfused 1 unit packed red blood cells.  [facility] was consulted and patient was admitted.      Assessment/Plan:  Principal Problem:   Lower GI bleed Active Problems:   Essential hypertension   Type 2 diabetes mellitus without complication (HCC)   Acute blood loss anemia   GI bleed  Acute blood loss anemia, likely lower GI bleed Possibly from diverticular bleed Hemoglobin dropped from 14.8 on 3/4 down to 7.8 on presentation, continues to trend downwards Received 1U of PRBC on 3/31, 1U on 4/1, and 2U on 4/2 [facility] on board, s/p tagged RBC on 07/14/2019 with no evidence of active bleeding Frequent CBC  checks  Diabetes mellitus type 2 Hemoglobin A1c 6.1, controlled SSI, Accu-Cheks, hypoglycemic protocol On Metformin and Humulin at home  Essential hypertension Stable Hold home amlodipine for now Monitor closely  Obesity Lifestyle modification advised      VTE Prophylaxis: SCD due to GI bleed  Code Status: Full code  Family Communication: Discussed extensively with patient  Disposition Plan:  . Patient is from home . Likely disposition to home in 1 to 2 days. Barriers to discharge: Ongoing GI bleed, needing further observation in the hospital, pending GI sign off   Consultants:  GI  Procedures: Nuclear scan-tagged RBC  Antibiotics:  . None  Anti-infectives (From admission, onward)   None     Subjective: Today, patient denies any new complaints, had another 2 episodes of bright red blood per rectum.  Denies any abdominal pain, rectal pain, chest pain, shortness of breath, nausea/vomiting, fever/chills.   Objective: Vitals:   07/15/19 0903 07/15/19 1300  BP: (!) 153/57 (!) 150/67  Pulse: 75 74  Resp: 16 16  Temp: 98.6 F (37 C) 98.3 F (36.8 C)  SpO2: 100% 100%    Intake/Output Summary (Last 24 hours) at 07/15/2019 1417 Last data filed at 07/15/2019 1300 Gross per 24 hour  Intake 315 ml  Output --  Net 315 ml   Filed Weights   07/12/19 1104  Weight: 96.6 kg   Body mass index is 34.38 kg/m.   Physical Exam:  General: NAD   Cardiovascular: S1, S2 present  Respiratory: CTAB  Abdomen: Soft, nontender, nondistended, bowel sounds present  Musculoskeletal: No bilateral  pedal edema noted  Skin: Normal  Psychiatry: Normal mood    Data Review: I have personally reviewed the following laboratory data and studies,  CBC: Recent Labs  Lab 07/12/19 1121 07/12/19 2159 07/13/19 0253 07/13/19 1432 07/14/19 0538 07/14/19 1807 07/15/19 0536  WBC 6.9  --  6.9  --   --   --  7.3  HGB 7.2*   < > 7.6* 8.4* 7.1* 7.3* 7.0*  HCT 23.4*   < >  23.9* 26.7* 22.6* 23.7* 21.8*  MCV 81.5  --  83.0  --   --   --  85.2  PLT 192  --  161  --   --   --  180   < > = values in this interval not displayed.   Basic Metabolic Panel: Recent Labs  Lab 07/12/19 1121 07/13/19 0253 07/14/19 0538  NA 141 140 142  K 4.3 3.6 3.5  CL 106 107 108  CO2 25 26 25   GLUCOSE 163* 116* 107*  BUN 19 13 10   CREATININE 0.78 0.60 0.65  CALCIUM 8.6* 8.2* 8.3*  MG  --   --  2.0   Liver Function Tests: Recent Labs  Lab 07/12/19 1121  AST 15  ALT 10  ALKPHOS 42  BILITOT 0.3  PROT 5.7*  ALBUMIN 3.3*   No results for input(s): LIPASE, AMYLASE in the last 168 hours. No results for input(s): AMMONIA in the last 168 hours. Cardiac Enzymes: No results for input(s): CKTOTAL, CKMB, CKMBINDEX, TROPONINI in the last 168 hours. BNP (last 3 results) No results for input(s): BNP in the last 8760 hours.  ProBNP (last 3 results) No results for input(s): PROBNP in the last 8760 hours.  CBG: Recent Labs  Lab 07/13/19 2049 07/14/19 0743 07/14/19 1939 07/15/19 0744 07/15/19 1322  GLUCAP 139* 106* 167* 94 101*   Recent Results (from the past 240 hour(s))  SARS CORONAVIRUS 2 (TAT 6-24 HRS) Nasopharyngeal Nasopharyngeal Swab     Status: None   Collection Time: 07/12/19  2:17 PM   Specimen: Nasopharyngeal Swab  Result Value Ref Range Status   SARS Coronavirus 2 NEGATIVE NEGATIVE Final    Comment: (NOTE) SARS-CoV-2 target nucleic acids are NOT DETECTED. The SARS-CoV-2 RNA is generally detectable in upper and lower respiratory specimens during the acute phase of infection. Negative results do not preclude SARS-CoV-2 infection, do not rule out co-infections with other pathogens, and should not be used as the sole basis for treatment or other patient management decisions. Negative results must be combined with clinical observations, patient history, and epidemiological information. The expected result is Negative. Fact Sheet for  Patients: SugarRoll.be Fact Sheet for Healthcare Providers: https://www.woods-mathews.com/ This test is not yet approved or cleared by the [place] FDA and  has been authorized for detection and/or diagnosis of SARS-CoV-2 by FDA under an Emergency Use Authorization (EUA). This EUA will remain  in effect (meaning this test can be used) for the duration of the COVID-19 declaration under Section 56 4(b)(1) of the Act, 21 U.S.C. section 360bbb-3(b)(1), unless the authorization is terminated or revoked sooner. Performed at [facility], [place] [address], [place], [place] [address]      Studies: NM GI Blood Loss  Result Date: 07/14/2019 CLINICAL DATA:  Bloody stool for 2 days EXAM: NUCLEAR MEDICINE GASTROINTESTINAL BLEEDING SCAN TECHNIQUE: Sequential abdominal images were obtained following intravenous administration of Tc-3m labeled red blood cells. RADIOPHARMACEUTICALS:  26.3 mCi Tc-39m pertechnetate in-vitro labeled red cells. COMPARISON:  06/16/2012 FINDINGS: Anterior  planar images are obtained after radiotracer administration. During the first 60 minutes of imaging, there is no abnormal radiotracer accumulation to suggest active gastrointestinal bleeding. Normal distribution of radiotracer within the liver and vascular structures. At approximately 70 minutes of imaging, there is some accumulation of radiotracer overlying the central lower pelvis. I believe this likely reflects some excreted radiotracer within the urinary bladder, as there is no propagation to suggest intraluminal location within the bowel. There is no radiotracer accumulation suspicious for active gastrointestinal bleeding. IMPRESSION: 1. Likely accumulation of excreted radiotracer within the bladder on delayed imaging. 2. No radiotracer activity to suggest active gastrointestinal bleeding. Electronically Signed   By: Randa Ngo M.D.   On: 07/14/2019 17:17   [place] EKG SITE  RITE  Result Date: 07/14/2019 If Site Rite image not attached, placement could not be confirmed due to current cardiac rhythm.     Alma Friendly, MD  [facility] 07/15/2019

## 2019-07-15 NOTE — Progress Notes (Signed)
Tami Schmidt 12:38 PM  Subjective: Patient without further bleeding and no new complaints and we rediscussed diverticuli and answered all of her questions  Objective: Vital signs stable afebrile no acute distress abdomen is soft nontender hemoglobin stable bleeding scan negative yesterday which we discussed  Assessment: Probable diverticular bleeding  Plan: If no bleeding today by 5 PM may have soft solids this evening and if she does well overnight she can probably go home tomorrow we are happy to see back as an outpatient if needed  [facility] E  office [phone] After 5PM or if no answer call [phone]

## 2019-07-16 LAB — TYPE AND SCREEN
ABO/RH(D): B POS
Antibody Screen: NEGATIVE
Unit division: 0
Unit division: 0
Unit division: 0
Unit division: 0

## 2019-07-16 LAB — CBC
HCT: 29.2 % — ABNORMAL LOW (ref 36.0–46.0)
Hemoglobin: 9.5 g/dL — ABNORMAL LOW (ref 12.0–15.0)
MCH: 27.9 pg (ref 26.0–34.0)
MCHC: 32.5 g/dL (ref 30.0–36.0)
MCV: 85.9 fL (ref 80.0–100.0)
Platelets: 180 10*3/uL (ref 150–400)
RBC: 3.4 MIL/uL — ABNORMAL LOW (ref 3.87–5.11)
RDW: 15.9 % — ABNORMAL HIGH (ref 11.5–15.5)
WBC: 7.4 10*3/uL (ref 4.0–10.5)
nRBC: 0.3 % — ABNORMAL HIGH (ref 0.0–0.2)

## 2019-07-16 LAB — BPAM RBC
Blood Product Expiration Date: 202104182359
Blood Product Expiration Date: 202104272359
Blood Product Expiration Date: 202105012359
Blood Product Expiration Date: 202105032359
ISSUE DATE / TIME: 202103301535
ISSUE DATE / TIME: 202104010826
ISSUE DATE / TIME: 202104020833
ISSUE DATE / TIME: 202104021421
Unit Type and Rh: 7300
Unit Type and Rh: 7300
Unit Type and Rh: 7300
Unit Type and Rh: 7300

## 2019-07-16 LAB — GLUCOSE, CAPILLARY
Glucose-Capillary: 92 mg/dL (ref 70–99)
Glucose-Capillary: 159 mg/dL — ABNORMAL HIGH (ref 70–99)

## 2019-07-16 NOTE — Progress Notes (Signed)
Tami Schmidt 10:53 AM  Subjective: Patient without further bleeding and no bowel movement and doing well today no new complaints  Objective: Vital signs stable afebrile no acute distress abdomen is soft nontender hemoglobin stable  Assessment: Seemingly resolved diverticular bleeding  Plan: If no signs of bleeding today okay to go home late this afternoon and we are happy to see back in the office as needed and please call sooner if I can be of any further assistance with this hospital stay  [facility] E  office [phone] After 5PM or if no answer call [phone]

## 2019-07-16 NOTE — Discharge Summary (Signed)
Discharge Summary  Tami Schmidt QIW:[id number] DOB: Feb 28, 1933  PCP: Tami Han, MD  Admit date: 07/12/2019 Discharge date: 07/16/2019  Time spent: 40 mins  Recommendations for Outpatient Follow-up:  1. PCP in 1 week 2. [facility] as needed  Discharge Diagnoses:  Active Hospital Problems   Diagnosis Date Noted  . Lower GI bleed 01/04/2018  . GI bleed 07/13/2019  . Acute blood loss anemia 07/12/2019  . Type 2 diabetes mellitus without complication ([place]) 41/74/0814  . Essential hypertension 03/25/2013    Resolved Hospital Problems  No resolved problems to display.    Discharge Condition: Stable  Diet recommendation: Heart healthy  Vitals:   07/15/19 2027 07/16/19 0741  BP: (!) 159/60 (!) 152/65  Pulse: 77 79  Resp: 18 17  Temp: 98 F (36.7 C) 99 F (37.2 C)  SpO2: 100% 99%    History of present illness:  Tami B Baxteris a 86 y.o.femalewith medical history significant ofhypertension, hyperlipidemia, diabetes mellitus type 2, GI bleed,breastcancer s/pradiation, chemotherapy, and mastectomy presented to the hospital with complaints of bloody stools. Symptoms initially started 2 days ago having 5-6 episodes of bright red blood with clots present and minimal stool. Patient noted that symptoms seemed to resolve on their own, but restarted so she decided to come to the hospital. Patient had been seen at an urgent care center on 3/3 with presumed diagnosis of diverticulitis and given prescription for antibiotics. Last EGD and colonoscopy were back in 12/2017 by Dr. Therisa Doyne for GI bleed which showed low-grade narrowingSchatziring, 4 cm hiatal hernia,LAgradeAesophagitis, multiple polyps removed, and diverticulosis. Upon admission into the emergency department patient was noted to have stable vital signs. While in the ED, patient was noted to have an irregular heart rhythm, but patient notes that this is chronic in nature. Labs significant for hemoglobin 7.8  (previously 14.8 on 3/4). Stool guaiacs were noted to be positive. Patient was transfused 1 unit packed red blood cells. [facility] was consulted and patient was admitted.    Today, patient denies any new complaints, eager to be discharged.  Patient denies any further bloody bowel movements, melena, abdominal pain, nausea/vomiting, chest pain, fever/chills.  Met patient in front of the sink in her room, washing herself up independently.  Patient ambulated 50 feet in the hallway, steady gait, with no assistive device.  Hospital Course:  Principal Problem:   Lower GI bleed Active Problems:   Essential hypertension   Type 2 diabetes mellitus without complication (HCC)   Acute blood loss anemia   GI bleed  Acute blood loss anemia,likely lowerGI bleed Possibly from diverticular bleed Hemoglobin dropped from 14.8 on 3/4 down to 7.8 on presentation, now stable status post 4 units of PRBC last on 07/15/2019 [facility] Dr. Watt Climes consulted, s/p tagged RBC on 07/14/2019 with no evidence of active bleeding Follow-up with GI as outpatient as needed Follow-up with PCP with repeat labs in 1 week  Diabetes mellitus type 2 Hemoglobin A1c 6.1, controlled Continue Metformin and Humulin at home  Essential hypertension Stable Continue home regimen  Obesity Lifestyle modification advised       Malnutrition Type:      Malnutrition Characteristics:      Nutrition Interventions:      Estimated body mass index is 34.38 kg/m as calculated from the following:   Height as of this encounter: '5\' 6"'  (1.676 m).   Weight as of this encounter: 96.6 kg.    Procedures:  Nuclear scan-tagged RBC  Consultations:  GI  Discharge Exam:  BP (!) 152/65 (BP Location: Right Arm)   Pulse 79   Temp 99 F (37.2 C)   Resp 17   Ht '5\' 6"'  (1.676 m)   Wt 96.6 kg   SpO2 99%   BMI 34.38 kg/m   General: NAD Cardiovascular: S1, S2 present Respiratory: CTA B  Discharge Instructions You were cared  for by a hospitalist during your hospital stay. If you have any questions about your discharge medications or the care you received while you were in the hospital after you are discharged, you can call the unit and asked to speak with the hospitalist on call if the hospitalist that took care of you is not available. Once you are discharged, your primary care physician will handle any further medical issues. Please note that NO REFILLS for any discharge medications will be authorized once you are discharged, as it is imperative that you return to your primary care physician (or establish a relationship with a primary care physician if you do not have one) for your aftercare needs so that they can reassess your need for medications and monitor your lab values.  Discharge Instructions    Diet - low sodium heart healthy   Complete by: As directed    Increase activity slowly   Complete by: As directed      Allergies as of 07/16/2019      Reactions   Bactrim Swelling   SWELLING OF MOUTH/FACE.   Lisinopril Swelling   SWELLING OF MOUTH/FACE.   Vasotec Swelling   SWELLING OF MOUTH/FACE.      Medication List    STOP taking these medications   potassium chloride 10 MEQ tablet Commonly known as: KLOR-CON     TAKE these medications   amLODipine 10 MG tablet Commonly known as: NORVASC Take 10 mg by mouth daily with breakfast.   diclofenac Sodium 1 % Gel Commonly known as: VOLTAREN Apply 2 g topically daily as needed (For topical arthritis.).   diphenhydramine-acetaminophen 25-500 MG Tabs tablet Commonly known as: TYLENOL PM Take 1 tablet by mouth at bedtime as needed (sleep).   gabapentin 300 MG capsule Commonly known as: NEURONTIN Take 600 mg by mouth daily.   HumuLIN N 100 UNIT/ML injection Generic drug: insulin NPH Human Inject 25-30 Units into the skin daily.   metFORMIN 500 MG tablet Commonly known as: GLUCOPHAGE Take 500 mg by mouth daily with breakfast.     olmesartan-hydrochlorothiazide 40-12.5 MG tablet Commonly known as: BENICAR HCT Take 1 tablet by mouth daily.      Allergies  Allergen Reactions  . Bactrim Swelling    SWELLING OF MOUTH/FACE.  Marland Kitchen Lisinopril Swelling    SWELLING OF MOUTH/FACE.  Marland Kitchen Vasotec Swelling    SWELLING OF MOUTH/FACE.   Follow-up Information    Tami Han, MD. Schedule an appointment as soon as possible for a visit in 1 week(s).   Specialty: Family Medicine Contact information: [place] [address] [phone]        Clarene Essex, MD Follow up.   Specialty: Gastroenterology Why: Call if further bleeding occurs Contact information: [address] [place] [address] [phone]            The results of significant diagnostics from this hospitalization (including imaging, microbiology, ancillary and laboratory) are listed below for reference.    Significant Diagnostic Studies: NM GI Blood Loss  Result Date: 07/14/2019 CLINICAL DATA:  Bloody stool for 2 days EXAM: NUCLEAR MEDICINE GASTROINTESTINAL BLEEDING SCAN TECHNIQUE: Sequential  abdominal images were obtained following intravenous administration of Tc-60mlabeled red blood cells. RADIOPHARMACEUTICALS:  26.3 mCi Tc-913mertechnetate in-vitro labeled red cells. COMPARISON:  06/16/2012 FINDINGS: Anterior planar images are obtained after radiotracer administration. During the first 60 minutes of imaging, there is no abnormal radiotracer accumulation to suggest active gastrointestinal bleeding. Normal distribution of radiotracer within the liver and vascular structures. At approximately 70 minutes of imaging, there is some accumulation of radiotracer overlying the central lower pelvis. I believe this likely reflects some excreted radiotracer within the urinary bladder, as there is no propagation to suggest intraluminal location within the bowel. There is no radiotracer accumulation suspicious for active  gastrointestinal bleeding. IMPRESSION: 1. Likely accumulation of excreted radiotracer within the bladder on delayed imaging. 2. No radiotracer activity to suggest active gastrointestinal bleeding. Electronically Signed   By: MiRanda Ngo.D.   On: 07/14/2019 17:17   [place] SITE RITE  Result Date: 07/14/2019 If Site Rite image not attached, placement could not be confirmed due to current cardiac rhythm.   Microbiology: Recent Results (from the past 240 hour(s))  SARS CORONAVIRUS 2 (TAT 6-24 HRS) Nasopharyngeal Nasopharyngeal Swab     Status: None   Collection Time: 07/12/19  2:17 PM   Specimen: Nasopharyngeal Swab  Result Value Ref Range Status   SARS Coronavirus 2 NEGATIVE NEGATIVE Final    Comment: (NOTE) SARS-CoV-2 target nucleic acids are NOT DETECTED. The SARS-CoV-2 RNA is generally detectable in upper and lower respiratory specimens during the acute phase of infection. Negative results do not preclude SARS-CoV-2 infection, do not rule out co-infections with other pathogens, and should not be used as the sole basis for treatment or other patient management decisions. Negative results must be combined with clinical observations, patient history, and epidemiological information. The expected result is Negative. Fact Sheet for Patients: htSugarRoll.beact Sheet for Healthcare Providers: hthttps://www.woods-mathews.com/his test is not yet approved or cleared by the [place] and  has been authorized for detection and/or diagnosis of SARS-CoV-2 by FDA under an Emergency Use Authorization (EUA). This EUA will remain  in effect (meaning this test can be used) for the duration of the COVID-19 declaration under Section 56 4(b)(1) of the Act, 21 U.S.C. section 360bbb-3(b)(1), unless the authorization is terminated or revoked sooner. Performed at [facility] [place] [id number]    Labs: Basic Metabolic  Panel: Recent Labs  Lab 07/12/19 1121 07/13/19 0253 07/14/19 0538  NA 141 140 142  K 4.3 3.6 3.5  CL 106 107 108  CO2 '25 26 25  ' GLUCOSE 163* 116* 107*  BUN '19 13 10  ' CREATININE 0.78 0.60 0.65  CALCIUM 8.6* 8.2* 8.3*  MG  --   --  2.0   Liver Function Tests: Recent Labs  Lab 07/12/19 1121  AST 15  ALT 10  ALKPHOS 42  BILITOT 0.3  PROT 5.7*  ALBUMIN 3.3*   No results for input(s): LIPASE, AMYLASE in the last 168 hours. No results for input(s): AMMONIA in the last 168 hours. CBC: Recent Labs  Lab 07/12/19 1121 07/12/19 2159 07/13/19 0253 07/13/19 1432 07/14/19 0538 07/14/19 1807 07/15/19 0536 07/15/19 1908 07/16/19 0536  WBC 6.9  --  6.9  --   --   --  7.3 9.7 7.4  HGB 7.2*   < > 7.6*   < > 7.1* 7.3* 7.0* 10.8* 9.5*  HCT 23.4*   < > 23.9*   < > 22.6* 23.7* 21.8* 32.7*  29.2*  MCV 81.5  --  83.0  --   --   --  85.2 85.6 85.9  PLT 192  --  161  --   --   --  180 188 180   < > = values in this interval not displayed.   Cardiac Enzymes: No results for input(s): CKTOTAL, CKMB, CKMBINDEX, TROPONINI in the last 168 hours. BNP: BNP (last 3 results) No results for input(s): BNP in the last 8760 hours.  ProBNP (last 3 results) No results for input(s): PROBNP in the last 8760 hours.  CBG: Recent Labs  Lab 07/15/19 1322 07/15/19 1606 07/15/19 2027 07/16/19 0806 07/16/19 1222  GLUCAP 101* 96 148* 92 159*       Signed:  Alma Friendly, MD [facility] 07/16/2019, 2:53 PM

## 2019-07-16 NOTE — Progress Notes (Signed)
Patient ambulated 4ft in hall with supervision, no hands-on assistance or device needed. Pt had a steady gait and tolerated activity well.

## 2019-09-30 ENCOUNTER — Ambulatory Visit: Payer: Medicare Other | Admitting: Podiatry

## 2019-09-30 ENCOUNTER — Encounter: Payer: Self-pay | Admitting: Podiatry

## 2019-09-30 ENCOUNTER — Other Ambulatory Visit: Payer: Self-pay

## 2019-09-30 DIAGNOSIS — L84 Corns and callosities: Secondary | ICD-10-CM | POA: Diagnosis not present

## 2019-09-30 DIAGNOSIS — B351 Tinea unguium: Secondary | ICD-10-CM | POA: Diagnosis not present

## 2019-09-30 DIAGNOSIS — E1151 Type 2 diabetes mellitus with diabetic peripheral angiopathy without gangrene: Secondary | ICD-10-CM | POA: Diagnosis not present

## 2019-09-30 MED ORDER — NONFORMULARY OR COMPOUNDED ITEM: 3 refills | Status: DC

## 2019-09-30 NOTE — Patient Instructions (Addendum)
Apply Voltaren gel to right fifth toe on the (bottom)as instructed  [place] [phone] Antifungal nail solution      Diabetes Mellitus and Foot Care Foot care is an important part of your health, especially when you have diabetes. Diabetes may cause you to have problems because of poor blood flow (circulation) to your feet and legs, which can cause your skin to:  Become thinner and drier.  Break more easily.  Heal more slowly.  Peel and crack. You may also have nerve damage (neuropathy) in your legs and feet, causing decreased feeling in them. This means that you may not notice minor injuries to your feet that could lead to more serious problems. Noticing and addressing any potential problems early is the best way to prevent future foot problems. How to care for your feet Foot hygiene  Wash your feet daily with warm water and mild soap. Do not use hot water. Then, pat your feet and the areas between your toes until they are completely dry. Do not soak your feet as this can dry your skin.  Trim your toenails straight across. Do not dig under them or around the cuticle. File the edges of your nails with an emery board or nail file.  Apply a moisturizing lotion or petroleum jelly to the skin on your feet and to dry, brittle toenails. Use lotion that does not contain alcohol and is unscented. Do not apply lotion between your toes. Shoes and socks  Wear clean socks or stockings every day. Make sure they are not too tight. Do not wear knee-high stockings since they may decrease blood flow to your legs.  Wear shoes that fit properly and have enough cushioning. Always look in your shoes before you put them on to be sure there are no objects inside.  To break in new shoes, wear them for just a few hours a day. This prevents injuries on your feet. Wounds, scrapes, corns, and calluses  Check your feet daily for blisters, cuts, bruises, sores, and redness. If you cannot see  the bottom of your feet, use a mirror or ask someone for help.  Do not cut corns or calluses or try to remove them with medicine.  If you find a minor scrape, cut, or break in the skin on your feet, keep it and the skin around it clean and dry. You may clean these areas with mild soap and water. Do not clean the area with peroxide, alcohol, or iodine.  If you have a wound, scrape, corn, or callus on your foot, look at it several times a day to make sure it is healing and not infected. Check for: ? Redness, swelling, or pain. ? Fluid or blood. ? Warmth. ? Pus or a bad smell. General instructions  Do not cross your legs. This may decrease blood flow to your feet.  Do not use heating pads or hot water bottles on your feet. They may burn your skin. If you have lost feeling in your feet or legs, you may not know this is happening until it is too late.  Protect your feet from hot and cold by wearing shoes, such as at the beach or on hot pavement.  Schedule a complete foot exam at least once a year (annually) or more often if you have foot problems. If you have foot problems, report any cuts, sores, or bruises to your health care provider immediately. Contact a health care provider if:  You have a medical condition that increases  your risk of infection and you have any cuts, sores, or bruises on your feet.  You have an injury that is not healing.  You have redness on your legs or feet.  You feel burning or tingling in your legs or feet.  You have pain or cramps in your legs and feet.  Your legs or feet are numb.  Your feet always feel cold.  You have pain around a toenail. Get help right away if:  You have a wound, scrape, corn, or callus on your foot and: ? You have pain, swelling, or redness that gets worse. ? You have fluid or blood coming from the wound, scrape, corn, or callus. ? Your wound, scrape, corn, or callus feels warm to the touch. ? You have pus or a bad smell coming  from the wound, scrape, corn, or callus. ? You have a fever. ? You have a red line going up your leg. Summary  Check your feet every day for cuts, sores, red spots, swelling, and blisters.  Moisturize feet and legs daily.  Wear shoes that fit properly and have enough cushioning.  If you have foot problems, report any cuts, sores, or bruises to your health care provider immediately.  Schedule a complete foot exam at least once a year (annually) or more often if you have foot problems. This information is not intended to replace advice given to you by your health care provider. Make sure you discuss any questions you have with your health care provider. Document Revised: 12/22/2018 Document Reviewed: 05/02/2016 Elsevier Patient Education  [place].

## 2019-10-08 NOTE — Progress Notes (Addendum)
Subjective: Tami Schmidt presents today preventative diabetic foot care and painful callus(es) right foot and painful mycotic toenails b/l that are difficult to trim. Pain interferes with ambulation. Aggravating factors include wearing enclosed shoe gear. Pain is relieved with periodic professional debridement.  [name], MD is patient's PCP.  She would like to discuss options for her fungal toenails. She voices no new pedal concerns on today's visit.  Past Medical History:  Diagnosis Date  . Arthritis   . Breast cancer ([place])    b/l mastectomies hx  . Cancer [facility])    breast  . 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 Diabetes mellitus    fasting 90-100  . HTN (hypertension) 03/25/2013  . Hx of radiation therapy    breasts hx  . Hypercholesterolemia   . Hypertension   . Lymphedema of arm    left arm  . Type II or unspecified type diabetes mellitus without mention of complication, not stated as uncontrolled 03/25/2013     Patient Active Problem List   Diagnosis Date Noted  . GI bleed 07/13/2019  . Acute blood loss anemia 07/12/2019  . Microcytic anemia 01/05/2018  . Melena 01/04/2018  . Lower GI bleed 01/04/2018  . Type 2 diabetes mellitus without complication ([place]) 96/78/9381  . Bacteremia due to methicillin susceptible Staphylococcus aureus (MSSA) 12/02/2017  . Tenosynovitis of left wrist 12/02/2017  . Infection of left wrist ([place]) 11/30/2017  . Primary osteoarthritis of right hip 01/05/2017  . Osteoarthritis of right hip 01/03/2017  . Lymphedema of upper extremity 01/17/2014  . Osteopenia 01/17/2014  . 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 Dilated pore of Winer 08/02/2013  . 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 Scar 08/02/2013  . Malignant neoplasm of lower-inner quadrant of left breast in female, estrogen receptor positive ([place]) 06/16/2013  . Type II or unspecified type diabetes  mellitus without mention of complication, not stated as uncontrolled 03/25/2013  . Essential hypertension 03/25/2013  . 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 Abdominal wall mass 01/14/2013    Current Outpatient Medications on File Prior to Visit  Medication Sig Dispense Refill  . amLODipine (NORVASC) 10 MG tablet Take 10 mg by mouth daily with breakfast.     . diclofenac Sodium (VOLTAREN) 1 % GEL Apply 2 g topically daily as needed (For topical arthritis.).     Marland Kitchen diphenhydramine-acetaminophen (TYLENOL PM) 25-500 MG TABS tablet Take 1 tablet by mouth at bedtime as needed (sleep).    . gabapentin (NEURONTIN) 300 MG capsule Take 600 mg by mouth daily.     Marland Kitchen gabapentin (NEURONTIN) 400 MG capsule Take 400 mg by mouth 3 (three) times daily.    Marland Kitchen HUMULIN N 100 UNIT/ML injection Inject 25-30 Units into the skin daily.     . metFORMIN (GLUCOPHAGE) 500 MG tablet Take 500 mg by mouth daily with breakfast.    . olmesartan-hydrochlorothiazide (BENICAR HCT) 40-12.5 MG tablet Take 1 tablet by mouth daily.    Marland Kitchen SM IRON 325 (65 Fe) MG tablet Take 325 mg by mouth daily.     No current facility-administered medications on file prior to visit.     Allergies  Allergen Reactions  . Bactrim Swelling    SWELLING OF MOUTH/FACE.  Marland Kitchen Lisinopril Swelling    SWELLING OF MOUTH/FACE.  Marland Kitchen Vasotec Swelling    SWELLING OF MOUTH/FACE.    Objective: Tami Schmidt is a pleasant 86 y.o. African American female  WD, WN in NAD. AAO x 3.  There were no vitals filed for this visit.  Vascular Examination: Neurovascular status unchanged b/l lower extremities. Capillary fill time to digits <3 seconds b/l lower extremities. Palpable DP pulse(s) b/l lower extremities Nonpalpable PT pulse(s) b/l lower extremities. Pedal hair absent. Lower extremity skin temperature gradient within normal limits. No pain with calf compression b/l. No edema noted b/l lower extremities.  Dermatological Examination: Pedal  skin with normal turgor, texture and tone bilaterally. No open wounds bilaterally. No interdigital macerations bilaterally. Toenails 1-5 b/l elongated, discolored, dystrophic, thickened, crumbly with subungual debris and tenderness to dorsal palpation. Hyperkeratotic lesion(s) L 5th toe, R 5th toe, submet head 4 left foot and sub 5th met base b/l.  No erythema, no edema, no drainage, no flocculence.  Musculoskeletal: Normal muscle strength 5/5 to all lower extremity muscle groups bilaterally. No pain crepitus or joint limitation noted with ROM b/l. Hallux valgus with bunion deformity noted b/l lower extremities. Hammertoes noted to the 2-5 bilaterally.  Neurological Examination: Protective sensation intact 5/5 intact bilaterally with 10g monofilament b/l. Vibratory sensation intact b/l. Proprioception intact bilaterally. Clonus negative b/l.  Last A1c: Hemoglobin A1C Latest Ref Rng & Units 07/13/2019  HGBA1C 4.8 - 5.6 % 6.1(H)  Some recent data might be hidden     Assessment: 1. Onychomycosis   2. Corns and callosities   3. Type II diabetes mellitus with peripheral circulatory disorder (HCC)    Plan: -Examined patient. -Continue diabetic foot care principles. -Discussed topical, laser and oral medication. Patient opted for topical treatment with compounded medication. Rx written for nonformulary compounding topical antifungal: [facility]: Antifungal cream - Terbinafine 3%, Fluconazole 2%, Tea Tree Oil 5%, Urea 10%, Ibuprofen 2% in DMSO Suspension #47m. Apply to the affected nail(s) at bedtime. -Toenails 1-5 b/l were debrided in length and girth with sterile nail nippers and dremel without iatrogenic bleeding.  -Corn(s) L 5th toe and R 5th toe and callus(es) submet head 4 left foot and sub 5th met base b/l were pared utilizing sterile scalpel blade without incident. Total number debrided =5. -Patient to report any pedal injuries to medical professional immediately. -Patient to  continue soft, supportive shoe gear daily. -Patient/POA to call should there be question/concern in the interim.  Return in about 3 months (around 12/31/2019) for diabetic nail trim.  JMarzetta Board DPM

## 2019-12-13 ENCOUNTER — Other Ambulatory Visit: Payer: Self-pay | Admitting: Family Medicine

## 2019-12-13 ENCOUNTER — Ambulatory Visit
Admission: RE | Admit: 2019-12-13 | Discharge: 2019-12-13 | Disposition: A | Discharge status: Home / Self Care | Payer: Medicare Other | Source: Ambulatory Visit | Attending: Family Medicine | Admitting: Family Medicine

## 2019-12-13 ENCOUNTER — Other Ambulatory Visit: Payer: Self-pay

## 2019-12-13 DIAGNOSIS — M79672 Pain in left foot: Secondary | ICD-10-CM

## 2019-12-13 DIAGNOSIS — M79671 Pain in right foot: Secondary | ICD-10-CM

## 2020-01-11 ENCOUNTER — Ambulatory Visit: Payer: Medicare Other | Admitting: Podiatry

## 2020-01-16 ENCOUNTER — Other Ambulatory Visit: Payer: Self-pay

## 2020-01-16 ENCOUNTER — Ambulatory Visit (INDEPENDENT_AMBULATORY_CARE_PROVIDER_SITE_OTHER): Payer: Medicare Other | Admitting: Podiatry

## 2020-01-16 DIAGNOSIS — B351 Tinea unguium: Secondary | ICD-10-CM

## 2020-01-16 DIAGNOSIS — M79674 Pain in right toe(s): Secondary | ICD-10-CM | POA: Diagnosis not present

## 2020-01-16 DIAGNOSIS — M79675 Pain in left toe(s): Secondary | ICD-10-CM | POA: Diagnosis not present

## 2020-01-16 DIAGNOSIS — L989 Disorder of the skin and subcutaneous tissue, unspecified: Secondary | ICD-10-CM | POA: Diagnosis not present

## 2020-01-16 DIAGNOSIS — E0843 Diabetes mellitus due to underlying condition with diabetic autonomic (poly)neuropathy: Secondary | ICD-10-CM

## 2020-01-16 NOTE — Progress Notes (Signed)
   SUBJECTIVE Patient with a history of diabetes mellitus presents to office today complaining of elongated, thickened nails that cause pain while ambulating in shoes.  she is unable to trim her own nails. Patient is here for further evaluation and treatment.   Past Medical History:  Diagnosis Date  . Arthritis   . Breast cancer ([place])    b/l mastectomies hx  . Cancer [facility])    breast  . 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 Diabetes mellitus    fasting 90-100  . HTN (hypertension) 03/25/2013  . Hx of radiation therapy    breasts hx  . Hypercholesterolemia   . Hypertension   . Lymphedema of arm    left arm  . Type II or unspecified type diabetes mellitus without mention of complication, not stated as uncontrolled 03/25/2013    OBJECTIVE General Patient is awake, alert, and oriented x 3 and in no acute distress. Derm Skin is dry and supple bilateral. Negative open lesions or macerations. Remaining integument unremarkable. Nails are tender, long, thickened and dystrophic with subungual debris, consistent with onychomycosis, 1-5 bilateral. No signs of infection noted.  Hyperkeratotic preulcerative callus tissue also noted to the bilateral feet Vasc  DP and PT pedal pulses palpable bilaterally. Temperature gradient within normal limits.  Neuro Epicritic and protective threshold sensation diminished bilaterally.  Musculoskeletal Exam No symptomatic pedal deformities noted bilateral. Muscular strength within normal limits.  ASSESSMENT 1. Diabetes Mellitus w/ peripheral neuropathy 2. Onychomycosis of nail due to dermatophyte bilateral 3. Pain in foot bilateral 4.  Preulcerative calluses bilateral feet  PLAN OF CARE 1. Patient evaluated today. 2. Instructed to maintain good pedal hygiene and foot care. Stressed importance of controlling blood sugar.  3. Mechanical debridement of nails 1-5 bilaterally performed using a nail nipper. Filed with dremel without incident.    4.  Excisional debridement of the preulcerative callus lesions were performed using a tissue nipper without incident or bleeding  5.  Return to clinic in 3 mos.     Edrick Kins, DPM [facility]  Dr. Edrick Kins, [address]                                        [place], [place] [address]                Office [phone]  Fax [phone]

## 2020-04-23 ENCOUNTER — Ambulatory Visit: Payer: Medicare Other | Admitting: Podiatry

## 2020-05-10 ENCOUNTER — Ambulatory Visit: Payer: Medicare Other | Admitting: Sports Medicine

## 2020-05-10 ENCOUNTER — Encounter: Payer: Self-pay | Admitting: Sports Medicine

## 2020-05-10 ENCOUNTER — Other Ambulatory Visit: Payer: Self-pay

## 2020-05-10 DIAGNOSIS — E1151 Type 2 diabetes mellitus with diabetic peripheral angiopathy without gangrene: Secondary | ICD-10-CM

## 2020-05-10 DIAGNOSIS — E0843 Diabetes mellitus due to underlying condition with diabetic autonomic (poly)neuropathy: Secondary | ICD-10-CM

## 2020-05-10 DIAGNOSIS — M79675 Pain in left toe(s): Secondary | ICD-10-CM

## 2020-05-10 DIAGNOSIS — L989 Disorder of the skin and subcutaneous tissue, unspecified: Secondary | ICD-10-CM

## 2020-05-10 DIAGNOSIS — I739 Peripheral vascular disease, unspecified: Secondary | ICD-10-CM

## 2020-05-10 DIAGNOSIS — B351 Tinea unguium: Secondary | ICD-10-CM | POA: Diagnosis not present

## 2020-05-10 DIAGNOSIS — M79674 Pain in right toe(s): Secondary | ICD-10-CM

## 2020-05-10 DIAGNOSIS — L84 Corns and callosities: Secondary | ICD-10-CM

## 2020-05-10 NOTE — Progress Notes (Signed)
Subjective: Tami Schmidt is a 87 y.o. female patient with history of diabetes who presents to office today complaining of long,mildly painful nails  while ambulating in shoes; unable to trim. Patient states that the glucose reading this morning was 105 mg/dl last a1c under 6 and PCP visit 6 months ago. Patient denies any new changes in medication or new problems. No other issues noted.   Patient Active Problem List   Diagnosis Date Noted  . GI bleed 07/13/2019  . Acute blood loss anemia 07/12/2019  . Microcytic anemia 01/05/2018  . Melena 01/04/2018  . Lower GI bleed 01/04/2018  . Type 2 diabetes mellitus without complication ([place]) 57/32/2025  . Bacteremia due to methicillin susceptible Staphylococcus aureus (MSSA) 12/02/2017  . Tenosynovitis of left wrist 12/02/2017  . Infection of left wrist ([place]) 11/30/2017  . Primary osteoarthritis of right hip 01/05/2017  . Osteoarthritis of right hip 01/03/2017  . Lymphedema of upper extremity 01/17/2014  . Osteopenia 01/17/2014  . 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 Dilated pore of Winer 08/02/2013  . 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 Scar 08/02/2013  . Malignant neoplasm of lower-inner quadrant of left breast in female, estrogen receptor positive ([place]) 06/16/2013  . Type II or unspecified type diabetes mellitus without mention of complication, not stated as uncontrolled 03/25/2013  . Essential hypertension 03/25/2013  . Chest wall recurrence of breast cancer ([place]) 02/21/2013  . Abdominal wall mass 01/14/2013   Current Outpatient Medications on File Prior to Visit  Medication Sig Dispense Refill  . amLODipine (NORVASC) 10 MG tablet Take 10 mg by mouth daily with breakfast.     . diclofenac Sodium (VOLTAREN) 1 % GEL Apply 2 g topically daily as needed (For topical arthritis.).     Marland Kitchen diphenhydramine-acetaminophen (TYLENOL PM) 25-500 MG TABS tablet Take 1 tablet by mouth at bedtime as  needed (sleep).    . gabapentin (NEURONTIN) 300 MG capsule Take 600 mg by mouth daily.     Marland Kitchen gabapentin (NEURONTIN) 400 MG capsule Take 400 mg by mouth 3 (three) times daily.    Marland Kitchen HUMULIN 70/30 (70-30) 100 UNIT/ML injection Inject 30 unit subcutaneously once a day    . HUMULIN N 100 UNIT/ML injection Inject 25-30 Units into the skin daily.     . metFORMIN (GLUCOPHAGE) 500 MG tablet Take 500 mg by mouth daily with breakfast.    . NONFORMULARY OR COMPOUNDED ITEM Antifungal solution: Terbinafine 3%, Fluconazole 2%, Tea Tree Oil 5%, Urea 10%, Ibuprofen 2% in DMSO suspension #2mL 1 each 3  . olmesartan-hydrochlorothiazide (BENICAR HCT) 40-12.5 MG tablet Take 1 tablet by mouth daily.    . pregabalin (LYRICA) 75 MG capsule Take 75 mg by mouth 2 (two) times daily.    Marland Kitchen SM IRON 325 (65 Fe) MG tablet Take 325 mg by mouth daily.     No current facility-administered medications on file prior to visit.   Allergies  Allergen Reactions  . Bactrim Swelling    SWELLING OF MOUTH/FACE.  Marland Kitchen Lisinopril Swelling    SWELLING OF MOUTH/FACE.  Marland Kitchen Vasotec Swelling    SWELLING OF MOUTH/FACE.    No results found for this or any previous visit (from the past 2160 hour(s)).  Objective: General: Patient is awake, alert, and oriented x 3 and in no acute distress.  Integument: Skin is warm, dry and supple bilateral. Nails are tender, long, thickened and  dystrophic with subungual debris, consistent with onychomycosis, 1-5 bilateral. No signs of  infection.+ callus plantar lateral midfoot on left. + minimal keratosis to bilateral 5th toes. No open lesions present bilateral. Remaining integument unremarkable.  Vasculature:  Dorsalis Pedis pulse 1/4 bilateral. Posterior Tibial pulse  0/4 bilateral.  Capillary fill time <5 sec 1-5 bilateral. No hair growth to the level of the digits. Temperature gradient within normal limits. No varicosities present bilateral. Trace edema present bilateral.   Neurology: The patient has  intact sensation measured with a 5.07/10g Semmes Weinstein Monofilament at all pedal sites bilateral . Vibratory sensation diminished bilateral with tuning fork. No Babinski sign present bilateral.   Musculoskeletal: Asymptomatic bunion, hammertoe, and pes planus pedal deformities noted bilateral. Muscular strength 4/5 in all lower extremity muscular groups bilateral without pain on range of motion . No tenderness with calf compression bilateral.  Assessment and Plan: Problem List Items Addressed This Visit   None   Visit Diagnoses    Pain due to onychomycosis of toenails of both feet    -  Primary   Diabetes mellitus due to underlying condition with diabetic autonomic neuropathy, unspecified whether long term insulin use (HCC)       Relevant Medications   HUMULIN 70/30 (70-30) 100 UNIT/ML injection   Benign skin lesion       Corns and callosities       Type II diabetes mellitus with peripheral circulatory disorder (HCC)       Relevant Medications   HUMULIN 70/30 (70-30) 100 UNIT/ML injection   PVD (peripheral vascular disease) ([place])           -Examined patient. -Discussed and educated patient on diabetic foot care, especially with  regards to the vascular, neurological and musculoskeletal systems.  -Stressed the importance of good glycemic control and the detriment of not  controlling glucose levels in relation to the foot. -Mechanically debrided all nails 1-5 bilateral using sterile nail nipper and filed with dremel without incident  -Mechanically debrided callus x 1 on left using 15 blade without incident and applied salinocaine and bandaid -Gave sample of foot miracle cream to use as instructed -Recommend good supportive shoes daily for foot type -Answered all patient questions -Patient to return  in 3 months for at risk foot care -Patient advised to call the office if any problems or questions arise in the meantime.  Landis Martins, DPM

## 2020-06-05 DIAGNOSIS — R430 Anosmia: Secondary | ICD-10-CM | POA: Insufficient documentation

## 2020-06-05 DIAGNOSIS — H9313 Tinnitus, bilateral: Secondary | ICD-10-CM | POA: Insufficient documentation

## 2020-08-07 DIAGNOSIS — H903 Sensorineural hearing loss, bilateral: Secondary | ICD-10-CM | POA: Insufficient documentation

## 2020-08-09 ENCOUNTER — Encounter: Payer: Self-pay | Admitting: Sports Medicine

## 2020-08-09 ENCOUNTER — Other Ambulatory Visit: Payer: Self-pay

## 2020-08-09 ENCOUNTER — Ambulatory Visit: Payer: Medicare Other | Admitting: Sports Medicine

## 2020-08-09 DIAGNOSIS — E1151 Type 2 diabetes mellitus with diabetic peripheral angiopathy without gangrene: Secondary | ICD-10-CM

## 2020-08-09 DIAGNOSIS — I700 Atherosclerosis of aorta: Secondary | ICD-10-CM | POA: Insufficient documentation

## 2020-08-09 DIAGNOSIS — M1991 Primary osteoarthritis, unspecified site: Secondary | ICD-10-CM | POA: Insufficient documentation

## 2020-08-09 DIAGNOSIS — E0843 Diabetes mellitus due to underlying condition with diabetic autonomic (poly)neuropathy: Secondary | ICD-10-CM

## 2020-08-09 DIAGNOSIS — B351 Tinea unguium: Secondary | ICD-10-CM | POA: Diagnosis not present

## 2020-08-09 DIAGNOSIS — L84 Corns and callosities: Secondary | ICD-10-CM

## 2020-08-09 DIAGNOSIS — M79674 Pain in right toe(s): Secondary | ICD-10-CM

## 2020-08-09 DIAGNOSIS — M79675 Pain in left toe(s): Secondary | ICD-10-CM | POA: Diagnosis not present

## 2020-08-09 DIAGNOSIS — L989 Disorder of the skin and subcutaneous tissue, unspecified: Secondary | ICD-10-CM

## 2020-08-09 DIAGNOSIS — R481 Agnosia: Secondary | ICD-10-CM | POA: Insufficient documentation

## 2020-08-09 DIAGNOSIS — I739 Peripheral vascular disease, unspecified: Secondary | ICD-10-CM | POA: Diagnosis not present

## 2020-08-09 DIAGNOSIS — G629 Polyneuropathy, unspecified: Secondary | ICD-10-CM | POA: Insufficient documentation

## 2020-08-09 NOTE — Progress Notes (Signed)
Subjective: Tami Schmidt is a 87 y.o. female patient with history of diabetes who presents to office today complaining of long,mildly painful nails  while ambulating in shoes; unable to trim. Patient states that the glucose reading this morning was not recorded but A1c 6.1. No other issues noted.   Patient Active Problem List   Diagnosis Date Noted  . Agnosia 08/09/2020  . Hardening of the aorta (main artery of the heart) ([place]) 08/09/2020  . Morbid obesity ([place]) 08/09/2020  . Neuropathy 08/09/2020  . Primary osteoarthritis 08/09/2020  . Sensorineural hearing loss (SNHL) of both ears 08/07/2020  . Anosmia 06/05/2020  . Tinnitus, bilateral 06/05/2020  . GI bleed 07/13/2019  . Acute blood loss anemia 07/12/2019  . Microcytic anemia 01/05/2018  . Melena 01/04/2018  . Lower GI bleed 01/04/2018  . Type 2 diabetes mellitus without complication ([place]) 99/37/1696  . Bacteremia due to methicillin susceptible Staphylococcus aureus (MSSA) 12/02/2017  . Tenosynovitis of left wrist 12/02/2017  . Infection of left wrist ([place]) 11/30/2017  . Primary osteoarthritis of right hip 01/05/2017  . Osteoarthritis of right hip 01/03/2017  . Lymphedema of upper extremity 01/17/2014  . Osteopenia 01/17/2014  . 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 Dilated pore of Winer 08/02/2013  . 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 Scar 08/02/2013  . Malignant neoplasm of lower-inner quadrant of left breast in female, estrogen receptor positive ([place]) 06/16/2013  . Type II or unspecified type diabetes mellitus without mention of complication, not stated as uncontrolled 03/25/2013  . Essential hypertension 03/25/2013  . Chest wall recurrence of breast cancer ([place]) 02/21/2013  . Abdominal wall mass 01/14/2013   Current Outpatient Medications on File Prior to Visit  Medication Sig Dispense Refill  . DULoxetine (CYMBALTA) 20 MG capsule 1 capsule    . amLODipine  (NORVASC) 10 MG tablet Take 10 mg by mouth daily with breakfast.     . diclofenac Sodium (VOLTAREN) 1 % GEL Apply 2 g topically daily as needed (For topical arthritis.).     Marland Kitchen diphenhydramine-acetaminophen (TYLENOL PM) 25-500 MG TABS tablet Take 1 tablet by mouth at bedtime as needed (sleep).    . fluticasone (FLONASE) 50 MCG/ACT nasal spray Place into both nostrils.    Marland Kitchen gabapentin (NEURONTIN) 300 MG capsule Take 600 mg by mouth daily.     Marland Kitchen gabapentin (NEURONTIN) 400 MG capsule Take 400 mg by mouth 3 (three) times daily.    Marland Kitchen HUMULIN 70/30 (70-30) 100 UNIT/ML injection Inject 30 unit subcutaneously once a day    . HUMULIN N 100 UNIT/ML injection Inject 25-30 Units into the skin daily.     . metFORMIN (GLUCOPHAGE) 500 MG tablet Take 500 mg by mouth daily with breakfast.    . NONFORMULARY OR COMPOUNDED ITEM Antifungal solution: Terbinafine 3%, Fluconazole 2%, Tea Tree Oil 5%, Urea 10%, Ibuprofen 2% in DMSO suspension #43mL 1 each 3  . olmesartan-hydrochlorothiazide (BENICAR HCT) 40-12.5 MG tablet Take 1 tablet by mouth daily.    . pregabalin (LYRICA) 75 MG capsule Take 75 mg by mouth 2 (two) times daily.    Marland Kitchen SM IRON 325 (65 Fe) MG tablet Take 325 mg by mouth daily.     No current facility-administered medications on file prior to visit.   Allergies  Allergen Reactions  . Bactrim Swelling    SWELLING OF MOUTH/FACE.  Marland Kitchen Lisinopril Swelling    SWELLING OF MOUTH/FACE.  Marland Kitchen Vasotec Swelling    SWELLING OF MOUTH/FACE.  Marland Kitchen  Sulfamethoxazole-Trimethoprim     Other reaction(s): Unknown    No results found for this or any previous visit (from the past 2160 hour(s)).  Objective: General: Patient is awake, alert, and oriented x 3 and in no acute distress.  Integument: Skin is warm, dry and supple bilateral. Nails are tender, long, thickened and  dystrophic with subungual debris, consistent with onychomycosis, 1-5 bilateral. No signs of infection.+ callus plantar lateral midfoot on left. + minimal  keratosis to bilateral 5th toes. No open lesions present bilateral. Remaining integument unremarkable.  Vasculature:  Dorsalis Pedis pulse 1/4 bilateral. Posterior Tibial pulse  0/4 bilateral.  Capillary fill time <5 sec 1-5 bilateral. No hair growth to the level of the digits. Temperature gradient within normal limits. No varicosities present bilateral. Trace edema present bilateral.   Neurology: The patient has intact sensation measured with a 5.07/10g Semmes Weinstein Monofilament at all pedal sites bilateral . Vibratory sensation diminished bilateral with tuning fork. No Babinski sign present bilateral.   Musculoskeletal: Asymptomatic bunion, hammertoe, and pes planus pedal deformities noted bilateral. Muscular strength 4/5 in all lower extremity muscular groups bilateral without pain on range of motion . No tenderness with calf compression bilateral.  Assessment and Plan: Problem List Items Addressed This Visit   None   Visit Diagnoses    Pain due to onychomycosis of toenails of both feet    -  Primary   Diabetes mellitus due to underlying condition with diabetic autonomic neuropathy, unspecified whether long term insulin use (HCC)       Benign skin lesion       Corns and callosities       Type II diabetes mellitus with peripheral circulatory disorder (HCC)       PVD (peripheral vascular disease) ([place])           -Examined patient. -Re-Discussed and educated patient on diabetic foot care, especially with  regards to the vascular, neurological and musculoskeletal systems.  -Mechanically debrided all nails 1-5 bilateral using sterile nail nipper and filed with dremel without incident  -Mechanically debrided callus x 1 on left using rotary bur without incident -Continue with good supportive shoes for foot type and daily skin emollients  -Patient to return  in 3 months for at risk foot care -Patient advised to call the office if any problems or questions arise in the  meantime.  Landis Martins, DPM

## 2020-11-08 ENCOUNTER — Encounter: Payer: Self-pay | Admitting: Sports Medicine

## 2020-11-08 ENCOUNTER — Other Ambulatory Visit: Payer: Self-pay

## 2020-11-08 ENCOUNTER — Ambulatory Visit: Payer: Medicare Other | Admitting: Sports Medicine

## 2020-11-08 DIAGNOSIS — M79674 Pain in right toe(s): Secondary | ICD-10-CM | POA: Diagnosis not present

## 2020-11-08 DIAGNOSIS — M79675 Pain in left toe(s): Secondary | ICD-10-CM

## 2020-11-08 DIAGNOSIS — E0843 Diabetes mellitus due to underlying condition with diabetic autonomic (poly)neuropathy: Secondary | ICD-10-CM

## 2020-11-08 DIAGNOSIS — B351 Tinea unguium: Secondary | ICD-10-CM | POA: Diagnosis not present

## 2020-11-08 NOTE — Progress Notes (Signed)
Subjective: Tami Schmidt is a 87 y.o. female patient with history of diabetes who presents to office today complaining of long,mildly painful nails  while ambulating in shoes; unable to trim. Patient states that the glucose reading this morning was not recorded but A1c 6.1. No other issues noted.   Patient Active Problem List   Diagnosis Date Noted   Agnosia 08/09/2020   Hardening of the aorta (main artery of the heart) ([place]) 08/09/2020   Morbid obesity ([place]) 08/09/2020   Neuropathy 08/09/2020   Primary osteoarthritis 08/09/2020   Sensorineural hearing loss (SNHL) of both ears 08/07/2020   Anosmia 06/05/2020   Tinnitus, bilateral 06/05/2020   GI bleed 07/13/2019   Acute blood loss anemia 07/12/2019   Microcytic anemia 01/05/2018   Melena 01/04/2018   Lower GI bleed 01/04/2018   Type 2 diabetes mellitus without complication ([place]) [id number]   Bacteremia due to methicillin susceptible Staphylococcus aureus (MSSA) 12/02/2017   Tenosynovitis of left wrist 12/02/2017   Infection of left wrist ([place]) 11/30/2017   Primary osteoarthritis of right hip 01/05/2017   Osteoarthritis of right hip 01/03/2017   Lymphedema of upper extremity 01/17/2014   Osteopenia 01/17/2014   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   Dilated pore of Winer 08/02/2013   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 08/02/2013   Scar 08/02/2013   Malignant neoplasm of lower-inner quadrant of left breast in female, estrogen receptor positive ([place]) 06/16/2013   Type II or unspecified type diabetes mellitus without mention of complication, not stated as uncontrolled 03/25/2013   Essential hypertension 03/25/2013   Chest wall recurrence of breast cancer ([place]) 02/21/2013   Abdominal wall mass 01/14/2013   Current Outpatient Medications on File Prior to Visit  Medication Sig Dispense Refill   amLODipine (NORVASC) 10 MG tablet Take 10 mg by mouth daily with breakfast.      amoxicillin  (AMOXIL) 500 MG capsule SMARTSIG:4 Capsule(s) By Mouth     diclofenac Sodium (VOLTAREN) 1 % GEL Apply 2 g topically daily as needed (For topical arthritis.).      diphenhydramine-acetaminophen (TYLENOL PM) 25-500 MG TABS tablet Take 1 tablet by mouth at bedtime as needed (sleep).     DULoxetine (CYMBALTA) 20 MG capsule 1 capsule     fluticasone (FLONASE) 50 MCG/ACT nasal spray Place into both nostrils.     gabapentin (NEURONTIN) 300 MG capsule Take 600 mg by mouth daily.      gabapentin (NEURONTIN) 400 MG capsule Take 400 mg by mouth 3 (three) times daily.     HUMULIN 70/30 (70-30) 100 UNIT/ML injection Inject 30 unit subcutaneously once a day     HUMULIN N 100 UNIT/ML injection Inject 25-30 Units into the skin daily.      metFORMIN (GLUCOPHAGE) 500 MG tablet Take 500 mg by mouth daily with breakfast.     NONFORMULARY OR COMPOUNDED ITEM Antifungal solution: Terbinafine 3%, Fluconazole 2%, Tea Tree Oil 5%, Urea 10%, Ibuprofen 2% in DMSO suspension #61m 1 each 3   olmesartan-hydrochlorothiazide (BENICAR HCT) 40-12.5 MG tablet Take 1 tablet by mouth daily.     pregabalin (LYRICA) 75 MG capsule Take 75 mg by mouth 2 (two) times daily.     SM IRON 325 (65 Fe) MG tablet Take 325 mg by mouth daily.     TYLENOL 325 MG tablet Take 325 mg by mouth daily.     venlafaxine (EFFEXOR) 37.5 MG tablet Take 37.5 mg by mouth daily as needed.     No current facility-administered  medications on file prior to visit.   Allergies  Allergen Reactions   Bactrim Swelling    SWELLING OF MOUTH/FACE.   Lisinopril Swelling    SWELLING OF MOUTH/FACE.   Vasotec Swelling    SWELLING OF MOUTH/FACE.   Sulfamethoxazole-Trimethoprim     Other reaction(s): Unknown    No results found for this or any previous visit (from the past 2160 hour(s)).  Objective: General: Patient is awake, alert, and oriented x 3 and in no acute distress.  Integument: Skin is warm, dry and supple bilateral. Nails are tender, long, thickened  and  dystrophic with subungual debris, consistent with onychomycosis, 1-5 bilateral. No signs of infection.+ callus plantar lateral midfoot on left. + minimal keratosis to bilateral 5th toes. No open lesions present bilateral. Remaining integument unremarkable.  Vasculature:  Dorsalis Pedis pulse 1/4 bilateral. Posterior Tibial pulse  0/4 bilateral.  Capillary fill time <5 sec 1-5 bilateral. No hair growth to the level of the digits. Temperature gradient within normal limits. No varicosities present bilateral. Trace edema present bilateral.   Neurology: The patient has intact sensation measured with a 5.07/10g Semmes Weinstein Monofilament at all pedal sites bilateral . Vibratory sensation diminished bilateral with tuning fork. No Babinski sign present bilateral.   Musculoskeletal: Asymptomatic bunion, hammertoe, and pes planus pedal deformities noted bilateral. Muscular strength 4/5 in all lower extremity muscular groups bilateral without pain on range of motion . No tenderness with calf compression bilateral.  Assessment and Plan: Problem List Items Addressed This Visit   None Visit Diagnoses     Pain due to onychomycosis of toenails of both feet    -  Primary   Diabetes mellitus due to underlying condition with diabetic autonomic neuropathy, unspecified whether long term insulin use ([place])            -Examined patient. -Discussed and educated patient on diabetic foot care, especially with  regards to the vascular, neurological and musculoskeletal systems.  -Mechanically debrided all nails 1-5 bilateral using sterile nail nipper and filed with dremel without incident  -Mechanically debrided callus x 1 on left using rotary bur without incident -Continue with good supportive shoes for foot type and daily skin emollients  -Patient to return  in 3 months for at risk foot care -Patient advised to call the office if any problems or questions arise in the meantime.  Landis Martins, DPM

## 2021-01-16 ENCOUNTER — Other Ambulatory Visit: Payer: Self-pay | Admitting: *Deleted

## 2021-01-16 DIAGNOSIS — C50312 Malignant neoplasm of lower-inner quadrant of left female breast: Secondary | ICD-10-CM

## 2021-01-28 ENCOUNTER — Ambulatory Visit: Payer: Medicare Other | Attending: Oncology | Admitting: Physical Therapy

## 2021-01-28 ENCOUNTER — Other Ambulatory Visit: Payer: Self-pay

## 2021-01-28 DIAGNOSIS — I972 Postmastectomy lymphedema syndrome: Secondary | ICD-10-CM | POA: Diagnosis not present

## 2021-01-28 DIAGNOSIS — M6281 Muscle weakness (generalized): Secondary | ICD-10-CM | POA: Insufficient documentation

## 2021-01-28 DIAGNOSIS — M25611 Stiffness of right shoulder, not elsewhere classified: Secondary | ICD-10-CM | POA: Diagnosis not present

## 2021-01-28 DIAGNOSIS — M25612 Stiffness of left shoulder, not elsewhere classified: Secondary | ICD-10-CM | POA: Diagnosis not present

## 2021-01-28 DIAGNOSIS — R293 Abnormal posture: Secondary | ICD-10-CM | POA: Diagnosis not present

## 2021-01-28 NOTE — Therapy (Signed)
[facility] @ [place], [place], [address] Phone: [phone]   Fax:  [phone]  Physical Therapy Evaluation  Patient Details  Name: Tami Schmidt MRN: [id number] Date of Birth: Oct 18, 1932 Referring Provider (PT): magrinat   Encounter Date: 01/28/2021   PT End of Session - 01/28/21 1314     Visit Number 1    Number of Visits 24    Date for PT Re-Evaluation 04/01/21    PT Start Time 1215    PT Stop Time 1300    PT Time Calculation (min) 45 min    Activity Tolerance Patient tolerated treatment well    Behavior During Therapy [facility] for tasks assessed/performed             Past Medical History:  Diagnosis Date   Arthritis    Breast cancer ([place])    b/l mastectomies hx   Cancer ([place])    breast   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[place]) 03/25/2013   Diabetes mellitus    fasting 90-100   HTN (hypertension) 03/25/2013   Hx of radiation therapy    breasts hx   Hypercholesterolemia    Hypertension    Lymphedema of arm    left arm   Type II or unspecified type diabetes mellitus without mention of complication, not stated as uncontrolled 03/25/2013    Past Surgical History:  Procedure Laterality Date   ABDOMINAL HYSTERECTOMY     ANKLE SURGERY Right 1995   APPENDECTOMY     BIOPSY  01/07/2018   Procedure: BIOPSY;  Surgeon: [name], MD;  [facility];  Service: Gastroenterology;;   BREAST SURGERY Bilateral    mastectomy   COLONOSCOPY WITH PROPOFOL N/A 01/07/2018   Procedure: COLONOSCOPY WITH PROPOFOL;  Surgeon: [name], MD;  [facility];  Service: Gastroenterology;  Laterality: N/A;   ESOPHAGOGASTRODUODENOSCOPY (EGD) WITH PROPOFOL N/A 01/06/2018   Procedure: ESOPHAGOGASTRODUODENOSCOPY (EGD) WITH PROPOFOL;  Surgeon: [name], MD;  [facility];  Service: Gastroenterology;  Laterality: N/A;   HERNIA REPAIR  04-26-2010   MASS EXCISION Left 01/26/2013    Procedure: EXCISION LEFT CHEST WALL MASS AND LEFT ABDOMNAL WALL MASS;  Surgeon: Harl Bowie, MD;  Location: [facility];  Service: General;  Laterality: Left;   MASS EXCISION Left 09/20/2014   Procedure: EXCISION OF LEFT CHEST WALL MASS;  Surgeon: Coralie Keens, MD;  Location: [facility];  Service: General;  Laterality: Left;   TEE WITHOUT CARDIOVERSION N/A 12/08/2017   Procedure: TRANSESOPHAGEAL ECHOCARDIOGRAM (TEE);  Surgeon: Lelon Perla, MD;  Location: [facility];  Service: Cardiovascular;  Laterality: N/A;   TOTAL HIP ARTHROPLASTY Right 01/05/2017   TOTAL HIP ARTHROPLASTY Right 01/05/2017   Procedure: TOTAL HIP ARTHROPLASTY ANTERIOR APPROACH;  Surgeon: Frederik Pear, MD;  Location: [facility];  Service: Orthopedics;  Laterality: Right;    There were no vitals filed for this visit.    Subjective Assessment - 01/28/21 1223     Subjective "It just came back about 3 months ago"  Pt had treatment for left arm lymphedema in 2015 with MLD, bandaging and daytime. Lymphedema reduced and stabalized for several years and has now returned with no injury or change in activities. She has been wearing her old sleeve but feels she needs a new one. She wants to have more treatment to reduce her arm first.  She is interested in getting a pump as her friend has one and it has been helpful.  Pertinent History right breast cancer in 1993 with lumpectomy, 6 nodes and radiation, She had recurrance of right breast cancer in 2002 with mastectomy, left breast cancer in 2004 with mastectomy with one node removal She has left chest wall cancer in 2014 with removal and deloped lymphedema in her left arm after that surgery .  Past history includes right THR and diabetes    Patient Stated Goals to get her arm reduced and get a new sleeve.    Currently in Pain? No/denies                [facility] PT Assessment - 01/28/21 0001       Assessment   Medical Diagnosis bilateral breast cancer    Referring  Provider (PT) magrinat    Onset Date/Surgical Date 12/14/91   aproximate. Also surgery in 2002,2004, 2014   Hand Dominance Right      Precautions   Precautions --   mulitple surgeries and lymphedema in her left arm     Restrictions   Weight Bearing Restrictions No    Other Position/Activity Restrictions pt walks with a cane and has previous hip surgrey, but this session will be focused on treatment for her lymphedema      Balance Screen   Has the patient fallen in the past 6 months Yes    How many times? 1   pt missed a step and just fell   Has the patient had a decrease in activity level because of a fear of falling?  No    Is the patient reluctant to leave their home because of a fear of falling?  No      Home Ecologist residence    Living Arrangements Spouse/significant other   husband is able to assist   Available Help at Discharge Friend(s)    Type of [place] to enter    Entrance Stairs-Number of Steps 1    [place] Two level      Prior Function   Level of [place] Retired    Leisure pt does not exercise a regular basis, She goes to [employer] chi occasionally and to the gym once or twice a month      Cognition   Overall Cognitive Status Within Functional Limits for tasks assessed      Observation/Other Assessments   Observations pt comes in walking with straight cane, visible lymphedema in left arm pt with well healed incisons on bilateral chest with large abdomen below it      Sensation   Light Touch Not tested    Additional Comments limited movement in both shoulders      Coordination   Gross Motor Movements are Fluid and Coordinated No      Posture/Postural Control   Posture/Postural Control Postural limitations    Postural Limitations Rounded Shoulders;Forward head    Posture Comments pt with large anterior abdomen. pt states she has lost 20 pounds over the last year      ROM /  Strength   AROM / PROM / Strength AROM;Strength      AROM   Overall AROM  Deficits    Overall AROM Comments loud crepitus with attempts to move left shoulder    AROM Assessment Site Shoulder    Right/Left Shoulder Right;Left    Right Shoulder Flexion 85 Degrees    Right Shoulder ABduction 50 Degrees    Right Shoulder External Rotation  40 Degrees    Left Shoulder Flexion 50 Degrees    Left Shoulder ABduction 45 Degrees    Left Shoulder External Rotation 25 Degrees      Strength   Overall Strength Comments pt limited in both shoulders to < 3-/5. She says that she does not exercise her shoulders. She has crepitus in both shoulders with movment but does not have significant pain in them    Strength Assessment Site Shoulder    Right/Left Shoulder Right;Left      Palpation   Palpation comment lymphstatic lymphedema in left arm with hyperplasia and pitting in especailly in forearm and hand               LYMPHEDEMA/ONCOLOGY QUESTIONNAIRE - 01/28/21 0001       Type   Cancer Type bilateral breast cancer      Surgeries   Mastectomy Date 04/14/02   2004 on left, 2002 on right   Other Surgery Date 04/14/12   to left chest wall     Date Lymphedema/Swelling Started   Date 04/14/12   approx     Treatment   Past Radiation Treatment Yes    Body Site both sides of chest      What other symptoms do you have   Are you Having Heaviness or Tightness Yes    Are you having Pain No    Are you having pitting edema Yes    Body Site left arm and hand    Is it Hard or Difficult finding clothes that fit Yes    Do you have infections Yes    Comments 2019    Is there Decreased scar mobility Yes    [place] Sign Yes      Lymphedema Stage   Stage STAGE 2 SPONTANEOUSLY IRREVERSIBLE      Lymphedema Assessments   Lymphedema Assessments Upper extremities      Right Upper Extremity Lymphedema   15 cm Proximal to Olecranon Process 36 cm    10 cm Proximal to Olecranon Process 32 cm     Olecranon Process 23.5 cm    15 cm Proximal to Ulnar Styloid Process 24 cm    10 cm Proximal to Ulnar Styloid Process 19 cm    Just Proximal to Ulnar Styloid Process 15.7 cm    Across Hand at [employer] 19.5 cm    At [place] of 2nd Digit 6 cm      Left Upper Extremity Lymphedema   15 cm Proximal to Olecranon Process 45 cm    10 cm Proximal to Olecranon Process 41.5 cm    Olecranon Process 30.5 cm    15 cm Proximal to Ulnar Styloid Process 30 cm    10 cm Proximal to Ulnar Styloid Process 26.5 cm    Just Proximal to Ulnar Styloid Process 22 cm    Across Hand at [employer] 20.5 cm    At [place] of 2nd Digit 6.6 cm                     Objective measurements completed on examination: See above findings.       [facility] Adult PT Treatment/Exercise - 01/28/21 0001       Self-Care   Self-Care Other Self-Care Comments    Other Self-Care Comments  pt brings in her flat knit sleeve with a small silicone border. She says her husband has been helping her put it on. She says she has a glove too, but  it is very worn out. She was instructed to turn the top portion of the sleeve down and to put her fifth finger along the seam to help with sleev application.  I was able to appy it to her arm and pull it up to her upper arm, but it did not go all the way into her axilla and there is  extra skin over the top of the sleeve.                       PT Short Term Goals - 01/28/21 1329       PT SHORT TERM GOAL #1   Title Pt will have decrease in circumference of left forearm at 10 cm proximal to the ulnar styloid by 2 cm.    Baseline 22 cm on 01/28/2021    Time 4    Period Weeks    Status New               PT Long Term Goals - 01/28/21 1330       PT LONG TERM GOAL #1   Title Pt will be able to manage her lymphedema at home with compression pump or self MLD, compression garments and exercise    Time 8    Period Weeks    Status New      PT LONG TERM GOAL #2    Title Pt will be independent in a home exercise program for shoulder ROM and strength    Time 8    Period Weeks    Status New                    Plan - 01/28/21 1315     Clinical Impression Statement Pt comes back to PT with exacerbation of lymphedema in her left arm.  She has been trying to do self MLD and wearing her flat knit compression sleeve at home, but the lymphedema is persistent. She has significant decrease in range of motion and strength in both arms with audible crepitus with movement. She would benefit from another episode of complete decongestive therapy with MLD , bandaging and exercise with new daytime and night time sleeve ( pt would like to use [employer] ). She would aslo benefit from a Flexitouch Plus intermittent compression pump to help her with managment at home.    Personal Factors and Comorbidities Age;Comorbidity 3+    Comorbidities multiple surgeries for cance, lymphedema in left arm, arthritis, DM, increased age with decreased fitness    Examination-Activity Limitations Reach Overhead;Caring for Others;Carry    Stability/Clinical Decision Making Stable/Uncomplicated    Clinical Decision Making Low    Rehab Potential Good    PT Frequency 3x / week    PT Duration 8 weeks    PT Treatment/Interventions ADLs/Self Care Home Management;Functional mobility training;Therapeutic activities;Therapeutic exercise;Neuromuscular re-education;Patient/family education;Orthotic Fit/Training;Manual techniques;Manual lymph drainage;Compression bandaging;Scar mobilization;Passive range of motion;Taping    PT Next Visit Plan Begin complete decongestive therapy with bandaging and ordering flat knit day sleeve, night garment, compression pump, teach exercise program    Recommended Other Services demographics sent to [employer] on 10/17, pt would like to use [employer]    Consulted and Agree with Plan of Care Patient             Patient will benefit from skilled therapeutic  intervention in order to improve the following deficits and impairments:  Abnormal gait, Decreased range of motion, Decreased strength, Decreased endurance, Decreased balance, Decreased scar mobility,  Postural dysfunction, Increased fascial restricitons, Impaired perceived functional ability, Impaired UE functional use, Impaired flexibility, Increased edema, Obesity  Visit Diagnosis: Postmastectomy lymphedema - Plan: PT plan of care cert/re-cert  Muscle weakness (generalized) - Plan: PT plan of care cert/re-cert  Stiffness of left shoulder, not elsewhere classified - Plan: PT plan of care cert/re-cert  Stiffness of right shoulder, not elsewhere classified - Plan: PT plan of care cert/re-cert  Abnormal posture - Plan: PT plan of care cert/re-cert     Problem List Patient Active Problem List   Diagnosis Date Noted   Agnosia 08/09/2020   Hardening of the aorta (main artery of the heart) ([place]) 08/09/2020   Morbid obesity ([place]) 08/09/2020   Neuropathy 08/09/2020   Primary osteoarthritis 08/09/2020   Sensorineural hearing loss (SNHL) of both ears 08/07/2020   Anosmia 06/05/2020   Tinnitus, bilateral 06/05/2020   GI bleed 07/13/2019   Acute blood loss anemia 07/12/2019   Microcytic anemia 01/05/2018   Melena 01/04/2018   Lower GI bleed 01/04/2018   Type 2 diabetes mellitus without complication ([place]) 25/36/6440   Bacteremia due to methicillin susceptible Staphylococcus aureus (MSSA) 12/02/2017   Tenosynovitis of left wrist 12/02/2017   Infection of left wrist ([place]) 11/30/2017   Primary osteoarthritis of right hip 01/05/2017   Osteoarthritis of right hip 01/03/2017   Lymphedema of upper extremity 01/17/2014   Osteopenia 01/17/2014   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   Dilated pore of [place] 08/02/2013   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 Owens Shark PT  Norwood Levo, PT 01/28/2021, 1:35 PM  [facility] @ [place], [place], [address] Phone: [phone]   Fax:  [phone]  Name: Tami Schmidt MRN: [id number] Date of Birth: 1932-11-23

## 2021-02-08 ENCOUNTER — Other Ambulatory Visit: Payer: Self-pay | Admitting: Nurse Practitioner

## 2021-02-11 ENCOUNTER — Encounter: Payer: Self-pay | Admitting: Rehabilitation

## 2021-02-11 ENCOUNTER — Ambulatory Visit: Payer: Medicare Other | Admitting: Rehabilitation

## 2021-02-11 ENCOUNTER — Other Ambulatory Visit: Payer: Self-pay

## 2021-02-11 DIAGNOSIS — M6281 Muscle weakness (generalized): Secondary | ICD-10-CM

## 2021-02-11 DIAGNOSIS — M25611 Stiffness of right shoulder, not elsewhere classified: Secondary | ICD-10-CM

## 2021-02-11 DIAGNOSIS — I972 Postmastectomy lymphedema syndrome: Secondary | ICD-10-CM | POA: Diagnosis not present

## 2021-02-11 DIAGNOSIS — R293 Abnormal posture: Secondary | ICD-10-CM

## 2021-02-11 DIAGNOSIS — M25612 Stiffness of left shoulder, not elsewhere classified: Secondary | ICD-10-CM

## 2021-02-11 NOTE — Therapy (Signed)
[facility] @ [place], [place], [address] Phone: [phone]   Fax:  [phone]  Physical Therapy Treatment  Patient Details  Name: Tami Schmidt MRN: [id number] Date of Birth: 1933-03-24 Referring Provider (PT): magrinat   Encounter Date: 02/11/2021   PT End of Session - 02/11/21 1150     Visit Number 2    Number of Visits 24    Date for PT Re-Evaluation 04/01/21    PT Start Time 1106    PT Stop Time 1150    PT Time Calculation (min) 44 min    Activity Tolerance Patient tolerated treatment well;Treatment limited secondary to medical complications (Comment)    Behavior During Therapy [facility] for tasks assessed/performed             Past Medical History:  Diagnosis Date   Arthritis    Breast cancer ([place])    b/l mastectomies hx   Cancer ([place])    breast   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[place]) 03/25/2013   Diabetes mellitus    fasting 90-100   HTN (hypertension) 03/25/2013   Hx of radiation therapy    breasts hx   Hypercholesterolemia    Hypertension    Lymphedema of arm    left arm   Type II or unspecified type diabetes mellitus without mention of complication, not stated as uncontrolled 03/25/2013    Past Surgical History:  Procedure Laterality Date   ABDOMINAL HYSTERECTOMY     ANKLE SURGERY Right 1995   APPENDECTOMY     BIOPSY  01/07/2018   Procedure: BIOPSY;  Surgeon: [name], MD;  [facility];  Service: Gastroenterology;;   BREAST SURGERY Bilateral    mastectomy   COLONOSCOPY WITH PROPOFOL N/A 01/07/2018   Procedure: COLONOSCOPY WITH PROPOFOL;  Surgeon: [name], MD;  [facility];  Service: Gastroenterology;  Laterality: N/A;   ESOPHAGOGASTRODUODENOSCOPY (EGD) WITH PROPOFOL N/A 01/06/2018   Procedure: ESOPHAGOGASTRODUODENOSCOPY (EGD) WITH PROPOFOL;  Surgeon: [name], MD;  [facility];  Service: Gastroenterology;  Laterality: N/A;   HERNIA  REPAIR  04-26-2010   MASS EXCISION Left 01/26/2013   Procedure: EXCISION LEFT CHEST WALL MASS AND LEFT ABDOMNAL WALL MASS;  Surgeon: Harl Bowie, MD;  Location: [facility];  Service: General;  Laterality: Left;   MASS EXCISION Left 09/20/2014   Procedure: EXCISION OF LEFT CHEST WALL MASS;  Surgeon: Coralie Keens, MD;  Location: [facility];  Service: General;  Laterality: Left;   TEE WITHOUT CARDIOVERSION N/A 12/08/2017   Procedure: TRANSESOPHAGEAL ECHOCARDIOGRAM (TEE);  Surgeon: Lelon Perla, MD;  Location: [facility];  Service: Cardiovascular;  Laterality: N/A;   TOTAL HIP ARTHROPLASTY Right 01/05/2017   TOTAL HIP ARTHROPLASTY Right 01/05/2017   Procedure: TOTAL HIP ARTHROPLASTY ANTERIOR APPROACH;  Surgeon: Frederik Pear, MD;  Location: [facility];  Service: Orthopedics;  Laterality: Right;    There were no vitals filed for this visit.   Subjective Assessment - 02/11/21 1107     Subjective I have been bandaged in past.  It just keeps swelling.    Pertinent History right breast cancer in 1993 with lumpectomy, 6 nodes and radiation, She had recurrance of right breast cancer in 2002 with mastectomy, left breast cancer in 2004 with mastectomy with one node removal She has left chest wall cancer in 2014 with removal and deloped lymphedema in her left arm after that surgery .  Past history includes right THR and diabetes    Currently  in Pain? No/denies                               [facility] Adult PT Treatment/Exercise - 02/11/21 0001       Manual Therapy   Manual Therapy Manual Lymphatic Drainage (MLD);Compression Bandaging    Manual Lymphatic Drainage (MLD) briefly to the LT UE not working pathways due to possible recurrence status    Compression Bandaging to the Lt UE: lotion, medium tg soft from wrist to axilla, fingers 1-4, artiflex from wrist to axilla, then 6cm, 8cm, and 12cm bandage from wrist to axilla with education on bandage removal and washing - pt  experienced with bandaging so this was previously known to patient                     PT Education - 02/11/21 1149     Education Details review of bandage washing, wear, removal, POC    Person(s) Educated Patient    Methods Explanation    Comprehension Verbalized understanding              PT Short Term Goals - 01/28/21 1329       PT SHORT TERM GOAL #1   Title Pt will have decrease in circumference of left forearm at 10 cm proximal to the ulnar styloid by 2 cm.    Baseline 22 cm on 01/28/2021    Time 4    Period Weeks    Status New               PT Long Term Goals - 01/28/21 1330       PT LONG TERM GOAL #1   Title Pt will be able to manage her lymphedema at home with compression pump or self MLD, compression garments and exercise    Time 8    Period Weeks    Status New      PT LONG TERM GOAL #2   Title Pt will be independent in a home exercise program for shoulder ROM and strength    Time 8    Period Weeks    Status New                   Plan - 02/11/21 1150     Clinical Impression Statement Due to recent increase in swelling, new itching spot on the chest near the incision, and smaller than a pea sized hard nodule on the chest under the port a note was sent to Hinda Lenis from Dr. Virgie Dad office for recurrence check.  Avoided MLD on the chest and axilla but performed some softening techniques for the UE.  Bandaged UE with instruction for removal, etc.    PT Frequency 3x / week    PT Duration 8 weeks    PT Treatment/Interventions ADLs/Self Care Home Management;Functional mobility training;Therapeutic activities;Therapeutic exercise;Neuromuscular re-education;Patient/family education;Orthotic Fit/Training;Manual techniques;Manual lymph drainage;Compression bandaging;Scar mobilization;Passive range of motion;Taping    PT Next Visit Plan any F/U scheduled for imaging? continue CDT with bandaging and ordering flat knit day sleeve, night  garment, compression pump, teach exercise program    Consulted and Agree with Plan of Care Patient             Patient will benefit from skilled therapeutic intervention in order to improve the following deficits and impairments:     Visit Diagnosis: Postmastectomy lymphedema  Muscle weakness (generalized)  Stiffness of left shoulder, not elsewhere classified  Stiffness of right shoulder, not elsewhere classified  Abnormal posture     Problem List Patient Active Problem List   Diagnosis Date Noted   Agnosia 08/09/2020   Hardening of the aorta (main artery of the heart) ([place]) 08/09/2020   Morbid obesity ([place]) 08/09/2020   Neuropathy 08/09/2020   Primary osteoarthritis 08/09/2020   Sensorineural hearing loss (SNHL) of both ears 08/07/2020   Anosmia 06/05/2020   Tinnitus, bilateral 06/05/2020   GI bleed 07/13/2019   Acute blood loss anemia 07/12/2019   Microcytic anemia 01/05/2018   Melena 01/04/2018   Lower GI bleed 01/04/2018   Type 2 diabetes mellitus without complication ([place]) 44/06/4740   Bacteremia due to methicillin susceptible Staphylococcus aureus (MSSA) 12/02/2017   Tenosynovitis of left wrist 12/02/2017   Infection of left wrist ([place]) 11/30/2017   Primary osteoarthritis of right hip 01/05/2017   Osteoarthritis of right hip 01/03/2017   Lymphedema of upper extremity 01/17/2014   Osteopenia 01/17/2014   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   Dilated pore of [place] 08/02/2013   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

## 2021-02-11 NOTE — Patient Instructions (Signed)

## 2021-02-12 ENCOUNTER — Other Ambulatory Visit: Payer: Self-pay | Admitting: Oncology

## 2021-02-12 ENCOUNTER — Ambulatory Visit (INDEPENDENT_AMBULATORY_CARE_PROVIDER_SITE_OTHER): Payer: Medicare Other | Admitting: Podiatry

## 2021-02-12 DIAGNOSIS — M79674 Pain in right toe(s): Secondary | ICD-10-CM

## 2021-02-12 DIAGNOSIS — M79675 Pain in left toe(s): Secondary | ICD-10-CM | POA: Diagnosis not present

## 2021-02-12 DIAGNOSIS — L84 Corns and callosities: Secondary | ICD-10-CM

## 2021-02-12 DIAGNOSIS — E0843 Diabetes mellitus due to underlying condition with diabetic autonomic (poly)neuropathy: Secondary | ICD-10-CM | POA: Diagnosis not present

## 2021-02-12 DIAGNOSIS — I739 Peripheral vascular disease, unspecified: Secondary | ICD-10-CM | POA: Diagnosis not present

## 2021-02-12 DIAGNOSIS — B351 Tinea unguium: Secondary | ICD-10-CM

## 2021-02-13 ENCOUNTER — Ambulatory Visit: Payer: Medicare Other | Attending: Oncology | Admitting: Rehabilitation

## 2021-02-13 ENCOUNTER — Other Ambulatory Visit: Payer: Self-pay

## 2021-02-13 DIAGNOSIS — R293 Abnormal posture: Secondary | ICD-10-CM | POA: Insufficient documentation

## 2021-02-13 DIAGNOSIS — I972 Postmastectomy lymphedema syndrome: Secondary | ICD-10-CM | POA: Insufficient documentation

## 2021-02-13 DIAGNOSIS — M25611 Stiffness of right shoulder, not elsewhere classified: Secondary | ICD-10-CM | POA: Insufficient documentation

## 2021-02-13 DIAGNOSIS — M6281 Muscle weakness (generalized): Secondary | ICD-10-CM | POA: Insufficient documentation

## 2021-02-13 DIAGNOSIS — M25612 Stiffness of left shoulder, not elsewhere classified: Secondary | ICD-10-CM | POA: Diagnosis present

## 2021-02-13 NOTE — Therapy (Signed)
[facility] @ [address], [place], [address] Phone: [phone]   Fax:  [phone]  Physical Therapy Treatment  Patient Details  Name: Tami Schmidt MRN: [id number] Date of Birth: February 18, 1933 Referring Provider (PT): magrinat   Encounter Date: 02/13/2021   PT End of Session - 02/13/21 1047     Visit Number 3    Number of Visits 24    Date for PT Re-Evaluation 04/01/21    PT Start Time 1005    PT Stop Time 3007    PT Time Calculation (min) 40 min    Activity Tolerance Patient tolerated treatment well;Treatment limited secondary to medical complications (Comment)    Behavior During Therapy [facility] for tasks assessed/performed             Past Medical History:  Diagnosis Date   Arthritis    Breast cancer ([place])    b/l mastectomies hx   Cancer ([place])    breast   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[place]) 03/25/2013   Diabetes mellitus    fasting 90-100   HTN (hypertension) 03/25/2013   Hx of radiation therapy    breasts hx   Hypercholesterolemia    Hypertension    Lymphedema of arm    left arm   Type II or unspecified type diabetes mellitus without mention of complication, not stated as uncontrolled 03/25/2013    Past Surgical History:  Procedure Laterality Date   ABDOMINAL HYSTERECTOMY     ANKLE SURGERY Right 1995   APPENDECTOMY     BIOPSY  01/07/2018   Procedure: BIOPSY;  Surgeon: [name], MD;  [facility];  Service: Gastroenterology;;   BREAST SURGERY Bilateral    mastectomy   COLONOSCOPY WITH PROPOFOL N/A 01/07/2018   Procedure: COLONOSCOPY WITH PROPOFOL;  Surgeon: [name], MD;  [facility];  Service: Gastroenterology;  Laterality: N/A;   ESOPHAGOGASTRODUODENOSCOPY (EGD) WITH PROPOFOL N/A 01/06/2018   Procedure: ESOPHAGOGASTRODUODENOSCOPY (EGD) WITH PROPOFOL;  Surgeon: [name], MD;  [facility];  Service: Gastroenterology;  Laterality: N/A;   HERNIA  REPAIR  04-26-2010   MASS EXCISION Left 01/26/2013   Procedure: EXCISION LEFT CHEST WALL MASS AND LEFT ABDOMNAL WALL MASS;  Surgeon: Harl Bowie, MD;  Location: [place];  Service: General;  Laterality: Left;   MASS EXCISION Left 09/20/2014   Procedure: EXCISION OF LEFT CHEST WALL MASS;  Surgeon: Coralie Keens, MD;  Location: [place];  Service: General;  Laterality: Left;   TEE WITHOUT CARDIOVERSION N/A 12/08/2017   Procedure: TRANSESOPHAGEAL ECHOCARDIOGRAM (TEE);  Surgeon: Lelon Perla, MD;  Location: [place];  Service: Cardiovascular;  Laterality: N/A;   TOTAL HIP ARTHROPLASTY Right 01/05/2017   TOTAL HIP ARTHROPLASTY Right 01/05/2017   Procedure: TOTAL HIP ARTHROPLASTY ANTERIOR APPROACH;  Surgeon: Frederik Pear, MD;  Location: [place];  Service: Orthopedics;  Laterality: Right;    There were no vitals filed for this visit.   Subjective Assessment - 02/13/21 1006     Pertinent History right breast cancer in 1993 with lumpectomy, 6 nodes and radiation, She had recurrance of right breast cancer in 2002 with mastectomy, left breast cancer in 2004 with mastectomy with one node removal She has left chest wall cancer in 2014 with removal and deloped lymphedema in her left arm after that surgery .  Past history includes right THR and diabetes  [facility] Adult PT Treatment/Exercise - 02/13/21 0001       Manual Therapy   Manual therapy comments removed pts bandages with no redness or swelling pockets. arm and hand much smaller all around and pt happy with status    Manual Lymphatic Drainage (MLD) briefly to the LT UE not working pathways due to possible recurrence status    Compression Bandaging to the Lt UE: lotion, medium tg soft from wrist to axilla, fingers 1-4, artiflex from wrist to axilla, then 6cm, 8cm, and 12cm bandage from wrist to axilla                       PT Short Term Goals - 01/28/21 1329        PT SHORT TERM GOAL #1   Title Pt will have decrease in circumference of left forearm at 10 cm proximal to the ulnar styloid by 2 cm.    Baseline 22 cm on 01/28/2021    Time 4    Period Weeks    Status New               PT Long Term Goals - 01/28/21 1330       PT LONG TERM GOAL #1   Title Pt will be able to manage her lymphedema at home with compression pump or self MLD, compression garments and exercise    Time 8    Period Weeks    Status New      PT LONG TERM GOAL #2   Title Pt will be independent in a home exercise program for shoulder ROM and strength    Time 8    Period Weeks    Status New                   Plan - 02/13/21 1047     Clinical Impression Statement Dr. Jana Hakim responded to this PT's chart message and will be getting pt in for status check.  Pt has not yet heard from them but hopefully soon.  Lt UE is very much reduced today.  Did not remeasure, but will remeasure on Friday.    PT Frequency 3x / week    PT Duration 8 weeks    PT Treatment/Interventions ADLs/Self Care Home Management;Functional mobility training;Therapeutic activities;Therapeutic exercise;Neuromuscular re-education;Patient/family education;Orthotic Fit/Training;Manual techniques;Manual lymph drainage;Compression bandaging;Scar mobilization;Passive range of motion;Taping    PT Next Visit Plan any F/U scheduled for MD? continue CDT of Lt UE with bandaging and ordering flat knit day sleeve, night garment, compression pump, teach exercise program    Consulted and Agree with Plan of Care Patient             Patient will benefit from skilled therapeutic intervention in order to improve the following deficits and impairments:     Visit Diagnosis: Postmastectomy lymphedema  Muscle weakness (generalized)  Stiffness of left shoulder, not elsewhere classified  Stiffness of right shoulder, not elsewhere classified  Abnormal posture     Problem List Patient Active  Problem List   Diagnosis Date Noted   Agnosia 08/09/2020   Hardening of the aorta (main artery of the heart) ([place]) 08/09/2020   Morbid obesity ([place]) 08/09/2020   Neuropathy 08/09/2020   Primary osteoarthritis 08/09/2020   Sensorineural hearing loss (SNHL) of both ears 08/07/2020   Anosmia 06/05/2020   Tinnitus, bilateral 06/05/2020   GI bleed 07/13/2019   Acute blood loss anemia 07/12/2019   Microcytic anemia 01/05/2018   Melena 01/04/2018   Lower  GI bleed 01/04/2018   Type 2 diabetes mellitus without complication ([place]) 96/22/2979   Bacteremia due to methicillin susceptible Staphylococcus aureus (MSSA) 12/02/2017   Tenosynovitis of left wrist 12/02/2017   Infection of left wrist ([place]) 11/30/2017   Primary osteoarthritis of right hip 01/05/2017   Osteoarthritis of right hip 01/03/2017   Lymphedema of upper extremity 01/17/2014   Osteopenia 01/17/2014   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   Dilated pore of Winer 08/02/2013   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

## 2021-02-16 ENCOUNTER — Encounter: Payer: Self-pay | Admitting: Podiatry

## 2021-02-16 NOTE — Progress Notes (Signed)
  Subjective:  Patient ID: Tami Schmidt, female    DOB: [birth year],  MRN: [id number]  87 y.o. female presents with at risk foot care with history of diabetic neuropathy and corn(s) b/l feet , callus(es) b/l feet and painful mycotic nails.  Pain interferes with ambulation. Aggravating factors include wearing enclosed shoe gear. Painful toenails interfere with ambulation. Aggravating factors include wearing enclosed shoe gear. Pain is relieved with periodic professional debridement. Painful corns and calluses are aggravated when weightbearing with and without shoegear. Pain is relieved with periodic professional debridement.  Patient states her right 5th toe is most symptomatic today. Denies any redness or drainage, but is very painful when she wears shoes.  Patient's blood sugar was 92 mg/dl today.    PCP: [name], MD and last visit was: 9 months ago.  Review of Systems: Negative except as noted in the HPI.   Allergies  Allergen Reactions   Bactrim Swelling    SWELLING OF MOUTH/FACE.   Lisinopril Swelling    SWELLING OF MOUTH/FACE.   Vasotec Swelling    SWELLING OF MOUTH/FACE.   Sulfamethoxazole-Trimethoprim     Other reaction(s): Unknown    Objective:  There were no vitals filed for this visit. Constitutional Patient is a pleasant 88 y.o. African American female WD, WN in NAD. AAO x 3.  Vascular Capillary fill time to digits <4 seconds b/l lower extremities. Faintly palpable DP pulse(s) b/l lower extremities. Nonpalpable PT pulse(s) b/l lower extremities. Pedal hair absent. Lower extremity skin temperature gradient within normal limits. No pain with calf compression b/l. No cyanosis or clubbing noted. Trace edema noted BLE.  Neurologic Normal speech. Protective sensation intact 5/5 intact bilaterally with 10g monofilament b/l. Vibratory sensation diminished b/l.  Dermatologic Pedal integument with normal turgor, texture and tone b/l LE. No open wounds b/l. No  interdigital macerations b/l. Toenails 1-5 b/l elongated, thickened, discolored with subungual debris. +Tenderness with dorsal palpation of nailplates. Hyperkeratotic lesion(s) noted L 5th toe, R 5th toe, submet head 5 right foot, and plantarlateral aspect of midfoot left foot. Toenails 1-5 b/l elongated, discolored, dystrophic, thickened, crumbly with subungual debris and tenderness to dorsal palpation.  Orthopedic: Normal muscle strength 5/5 to all lower extremity muscle groups bilaterally. Normal muscle strength 5/5 to all lower extremity muscle groups bilaterally. HAV with bunion deformity noted b/l LE. Hammertoe deformity noted 2-5 b/l. Pes planus deformity noted b/l lower extremities.    Assessment:   1. Pain due to onychomycosis of toenails of both feet   2. Corns and callosities   3. PVD (peripheral vascular disease) ([place])   4. Diabetes mellitus due to underlying condition with diabetic autonomic neuropathy, unspecified whether long term insulin use ([place])    Plan:  Patient was evaluated and treated and all questions answered. Consent given for treatment as described below: -Examined patient. -Continue diabetic foot care principles: inspect feet daily, monitor glucose as recommended by PCP and/or Endocrinologist, and follow prescribed diet per PCP, Endocrinologist and/or dietician. -Toenails 1-5 b/l were debrided in length and girth with sterile nail nippers and dremel without iatrogenic bleeding.  -Corn(s) L 5th toe and R 5th toe and callus(es) submet head 5 right foot and plantarlateral aspect of midfoot left foot were pared utilizing sterile scalpel blade without incident. Total number debrided =4. -Patient/POA to call should there be question/concern in the interim.  Return in about 3 months (around 05/15/2021).  Marzetta Board, DPM

## 2021-02-18 ENCOUNTER — Ambulatory Visit: Payer: Medicare Other | Admitting: Rehabilitation

## 2021-02-18 ENCOUNTER — Other Ambulatory Visit: Payer: Self-pay

## 2021-02-18 ENCOUNTER — Encounter: Payer: Self-pay | Admitting: Rehabilitation

## 2021-02-18 DIAGNOSIS — I972 Postmastectomy lymphedema syndrome: Secondary | ICD-10-CM

## 2021-02-18 DIAGNOSIS — M25611 Stiffness of right shoulder, not elsewhere classified: Secondary | ICD-10-CM

## 2021-02-18 DIAGNOSIS — M25612 Stiffness of left shoulder, not elsewhere classified: Secondary | ICD-10-CM

## 2021-02-18 DIAGNOSIS — M6281 Muscle weakness (generalized): Secondary | ICD-10-CM

## 2021-02-18 DIAGNOSIS — R293 Abnormal posture: Secondary | ICD-10-CM

## 2021-02-18 NOTE — Therapy (Signed)
[facility] @ [address] [place], [place], [address] Phone: [phone]   Fax:  [phone]  Physical Therapy Treatment  Patient Details  Name: Tami Schmidt MRN: [id number] Date of Birth: February 27, 1933 Referring Provider (PT): magrinat   Encounter Date: 02/18/2021   PT End of Session - 02/18/21 1157     Visit Number 4    Number of Visits 24    Date for PT Re-Evaluation 04/01/21    PT Start Time 1105    PT Stop Time 8850    PT Time Calculation (min) 40 min    Activity Tolerance Patient tolerated treatment well;Treatment limited secondary to medical complications (Comment)    Behavior During Therapy [facility] for tasks assessed/performed             Past Medical History:  Diagnosis Date   Arthritis    Breast cancer ([place])    b/l mastectomies hx   Cancer ([place])    breast   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[place]) 03/25/2013   Diabetes mellitus    fasting 90-100   HTN (hypertension) 03/25/2013   Hx of radiation therapy    breasts hx   Hypercholesterolemia    Hypertension    Lymphedema of arm    left arm   Type II or unspecified type diabetes mellitus without mention of complication, not stated as uncontrolled 03/25/2013    Past Surgical History:  Procedure Laterality Date   ABDOMINAL HYSTERECTOMY     ANKLE SURGERY Right 1995   APPENDECTOMY     BIOPSY  01/07/2018   Procedure: BIOPSY;  Surgeon: [name], MD;  [facility];  Service: Gastroenterology;;   BREAST SURGERY Bilateral    mastectomy   COLONOSCOPY WITH PROPOFOL N/A 01/07/2018   Procedure: COLONOSCOPY WITH PROPOFOL;  Surgeon: [name], MD;  [facility];  Service: Gastroenterology;  Laterality: N/A;   ESOPHAGOGASTRODUODENOSCOPY (EGD) WITH PROPOFOL N/A 01/06/2018   Procedure: ESOPHAGOGASTRODUODENOSCOPY (EGD) WITH PROPOFOL;  Surgeon: [name], MD;  [facility];  Service: Gastroenterology;  Laterality: N/A;   HERNIA  REPAIR  04-26-2010   MASS EXCISION Left 01/26/2013   Procedure: EXCISION LEFT CHEST WALL MASS AND LEFT ABDOMNAL WALL MASS;  Surgeon: Harl Bowie, MD;  Location: [facility];  Service: General;  Laterality: Left;   MASS EXCISION Left 09/20/2014   Procedure: EXCISION OF LEFT CHEST WALL MASS;  Surgeon: Coralie Keens, MD;  Location: [facility];  Service: General;  Laterality: Left;   TEE WITHOUT CARDIOVERSION N/A 12/08/2017   Procedure: TRANSESOPHAGEAL ECHOCARDIOGRAM (TEE);  Surgeon: Lelon Perla, MD;  Location: [facility];  Service: Cardiovascular;  Laterality: N/A;   TOTAL HIP ARTHROPLASTY Right 01/05/2017   TOTAL HIP ARTHROPLASTY Right 01/05/2017   Procedure: TOTAL HIP ARTHROPLASTY ANTERIOR APPROACH;  Surgeon: Frederik Pear, MD;  Location: [facility];  Service: Orthopedics;  Laterality: Right;    There were no vitals filed for this visit.   Subjective Assessment - 02/18/21 1105     Subjective It slid down    Pertinent History right breast cancer in 1993 with lumpectomy, 6 nodes and radiation, She had recurrance of right breast cancer in 2002 with mastectomy, left breast cancer in 2004 with mastectomy with one node removal She has left chest wall cancer in 2014 with removal and deloped lymphedema in her left arm after that surgery .  Past history includes right THR and diabetes    Patient Stated Goals to get her arm reduced and  get a new sleeve.    Currently in Pain? No/denies                               [facility] Adult PT Treatment/Exercise - 02/18/21 0001       Manual Therapy   Manual Lymphatic Drainage (MLD) briefly to the LT UE not working pathways due to possible recurrence status. Applying lotion and providing hand and forearm deeper pressure to fibrosis    Compression Bandaging to the Lt UE: lotion, medium tg soft from wrist to axilla, fingers 1-4, switch to rosidal foam from hand to axilla to see if it would stop sliding.  then 6cm, 8cm, and 12cm  bandage from wrist to axilla. Pt reports comfort before leaving                       PT Short Term Goals - 01/28/21 1329       PT SHORT TERM GOAL #1   Title Pt will have decrease in circumference of left forearm at 10 cm proximal to the ulnar styloid by 2 cm.    Baseline 22 cm on 01/28/2021    Time 4    Period Weeks    Status New               PT Long Term Goals - 01/28/21 1330       PT LONG TERM GOAL #1   Title Pt will be able to manage her lymphedema at home with compression pump or self MLD, compression garments and exercise    Time 8    Period Weeks    Status New      PT LONG TERM GOAL #2   Title Pt will be independent in a home exercise program for shoulder ROM and strength    Time 8    Period Weeks    Status New                   Plan - 02/18/21 1158     Clinical Impression Statement Pt will be seeing MD next tuesday.  Pt continues to have success with bandaging but has sliding down quickly due to reduction of soft edema.  Added rosidal foam today to see if this helps and emailed pt video for caregiver bandaging as she thinks her husband would be able to help.  Quick vcs for this and remineded pt of when to remove the bandages.    PT Frequency 3x / week    PT Duration 8 weeks    PT Treatment/Interventions ADLs/Self Care Home Management;Functional mobility training;Therapeutic activities;Therapeutic exercise;Neuromuscular re-education;Patient/family education;Orthotic Fit/Training;Manual techniques;Manual lymph drainage;Compression bandaging;Scar mobilization;Passive range of motion;Taping    PT Next Visit Plan continue CDT of Lt UE with bandaging and ordering flat knit day sleeve, night garment, compression pump, teach exercise program    Consulted and Agree with Plan of Care Patient             Patient will benefit from skilled therapeutic intervention in order to improve the following deficits and impairments:  Abnormal gait,  Decreased range of motion, Decreased strength, Decreased endurance, Decreased balance, Decreased scar mobility, Postural dysfunction, Increased fascial restricitons, Impaired perceived functional ability, Impaired UE functional use, Impaired flexibility, Increased edema, Obesity  Visit Diagnosis: Postmastectomy lymphedema  Muscle weakness (generalized)  Stiffness of right shoulder, not elsewhere classified  Stiffness of left shoulder, not elsewhere classified  Abnormal posture  Problem List Patient Active Problem List   Diagnosis Date Noted   Agnosia 08/09/2020   Hardening of the aorta (main artery of the heart) ([place]) 08/09/2020   Morbid obesity ([place]) 08/09/2020   Neuropathy 08/09/2020   Primary osteoarthritis 08/09/2020   Sensorineural hearing loss (SNHL) of both ears 08/07/2020   Anosmia 06/05/2020   Tinnitus, bilateral 06/05/2020   GI bleed 07/13/2019   Acute blood loss anemia 07/12/2019   Microcytic anemia 01/05/2018   Melena 01/04/2018   Lower GI bleed 01/04/2018   Type 2 diabetes mellitus without complication ([place]) 41/28/7867   Bacteremia due to methicillin susceptible Staphylococcus aureus (MSSA) 12/02/2017   Tenosynovitis of left wrist 12/02/2017   Infection of left wrist ([place]) 11/30/2017   Primary osteoarthritis of right hip 01/05/2017   Osteoarthritis of right hip 01/03/2017   Lymphedema of upper extremity 01/17/2014   Osteopenia 01/17/2014   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   Dilated pore of Winer 08/02/2013   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

## 2021-02-20 ENCOUNTER — Other Ambulatory Visit: Payer: Self-pay

## 2021-02-20 ENCOUNTER — Ambulatory Visit: Payer: Medicare Other | Admitting: Rehabilitation

## 2021-02-20 DIAGNOSIS — I972 Postmastectomy lymphedema syndrome: Secondary | ICD-10-CM | POA: Diagnosis not present

## 2021-02-20 NOTE — Therapy (Signed)
[facility] @ [address] [place], [place], [address] Phone: [phone]   Fax:  [phone]  Physical Therapy Treatment  Patient Details  Name: Tami Schmidt MRN: [id number] Date of Birth: Sep 07, 1932 Referring Provider (PT): magrinat   Encounter Date: 02/20/2021   PT End of Session - 02/20/21 2113     Visit Number 5    Number of Visits 24    Date for PT Re-Evaluation 04/01/21    PT Start Time 1103    PT Stop Time 7672    PT Time Calculation (min) 50 min    Activity Tolerance Patient tolerated treatment well    Behavior During Therapy [facility] for tasks assessed/performed             Past Medical History:  Diagnosis Date   Arthritis    Breast cancer ([place])    b/l mastectomies hx   Cancer ([place])    breast   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/2013   Diabetes mellitus    fasting 90-100   HTN (hypertension) 03/25/2013   Hx of radiation therapy    breasts hx   Hypercholesterolemia    Hypertension    Lymphedema of arm    left arm   Type II or unspecified type diabetes mellitus without mention of complication, not stated as uncontrolled 03/25/2013    Past Surgical History:  Procedure Laterality Date   ABDOMINAL HYSTERECTOMY     ANKLE SURGERY Right 1995   APPENDECTOMY     BIOPSY  01/07/2018   Procedure: BIOPSY;  Surgeon: [name], MD;  [facility];  Service: Gastroenterology;;   BREAST SURGERY Bilateral    mastectomy   COLONOSCOPY WITH PROPOFOL N/A 01/07/2018   Procedure: COLONOSCOPY WITH PROPOFOL;  Surgeon: [name], MD;  [facility];  Service: Gastroenterology;  Laterality: N/A;   ESOPHAGOGASTRODUODENOSCOPY (EGD) WITH PROPOFOL N/A 01/06/2018   Procedure: ESOPHAGOGASTRODUODENOSCOPY (EGD) WITH PROPOFOL;  Surgeon: [name], MD;  [facility];  Service: Gastroenterology;  Laterality: N/A;   HERNIA REPAIR  04-26-2010   MASS EXCISION Left 01/26/2013   Procedure:  EXCISION LEFT CHEST WALL MASS AND LEFT ABDOMNAL WALL MASS;  Surgeon: Harl Bowie, MD;  Location: [facility];  Service: General;  Laterality: Left;   MASS EXCISION Left 09/20/2014   Procedure: EXCISION OF LEFT CHEST WALL MASS;  Surgeon: Coralie Keens, MD;  Location: [facility];  Service: General;  Laterality: Left;   TEE WITHOUT CARDIOVERSION N/A 12/08/2017   Procedure: TRANSESOPHAGEAL ECHOCARDIOGRAM (TEE);  Surgeon: Lelon Perla, MD;  Location: [facility];  Service: Cardiovascular;  Laterality: N/A;   TOTAL HIP ARTHROPLASTY Right 01/05/2017   TOTAL HIP ARTHROPLASTY Right 01/05/2017   Procedure: TOTAL HIP ARTHROPLASTY ANTERIOR APPROACH;  Surgeon: Frederik Pear, MD;  Location: [facility];  Service: Orthopedics;  Laterality: Right;    There were no vitals filed for this visit.   Subjective Assessment - 02/20/21 2110     Subjective It stayed up since last time.  The foam seemed to help    Pertinent History right breast cancer in 1993 with lumpectomy, 6 nodes and radiation, She had recurrance of right breast cancer in 2002 with mastectomy, left breast cancer in 2004 with mastectomy with one node removal She has left chest wall cancer in 2014 with removal and deloped lymphedema in her left arm after that surgery .  Past history includes right THR and diabetes    Currently in Pain? No/denies  [facility] Adult PT Treatment/Exercise - 02/20/21 0001       Manual Therapy   Manual Therapy Other (comment)    Manual therapy comments Removed bandage with good reduction evident and skin still looking healthy    Compression Bandaging to the Lt UE: lotion, medium tg soft from wrist to axilla, fingers 1-4, switch to rosidal foam from hand to axilla to see if it would stop sliding.  then 6cm, 8cm, and 12cm bandage from wrist to axilla. Pt reports comfort before leaving    Other Manual Therapy pt reports she feels ready to get measured for a new  sleeve so measured by for elvarex class 2 sleeve and glove and night garment.  Pt order was sent in to [facility] by the end of the day.                       PT Short Term Goals - 01/28/21 1329       PT SHORT TERM GOAL #1   Title Pt will have decrease in circumference of left forearm at 10 cm proximal to the ulnar styloid by 2 cm.    Baseline 22 cm on 01/28/2021    Time 4    Period Weeks    Status New               PT Long Term Goals - 01/28/21 1330       PT LONG TERM GOAL #1   Title Pt will be able to manage her lymphedema at home with compression pump or self MLD, compression garments and exercise    Time 8    Period Weeks    Status New      PT LONG TERM GOAL #2   Title Pt will be independent in a home exercise program for shoulder ROM and strength    Time 8    Period Weeks    Status New                   Plan - 02/20/21 2113     Clinical Impression Statement addition of foam helped compression stay up longer and was intact upon returning today.  Pt is happy with reduction and wanted to be measured for her new sleeve.  Pt will be going OOT for a funeral.    PT Frequency 3x / week    PT Duration 8 weeks    PT Treatment/Interventions ADLs/Self Care Home Management;Functional mobility training;Therapeutic activities;Therapeutic exercise;Neuromuscular re-education;Patient/family education;Orthotic Fit/Training;Manual techniques;Manual lymph drainage;Compression bandaging;Scar mobilization;Passive range of motion;Taping    Consulted and Agree with Plan of Care Patient             Patient will benefit from skilled therapeutic intervention in order to improve the following deficits and impairments:  Abnormal gait, Decreased range of motion, Decreased strength, Decreased endurance, Decreased balance, Decreased scar mobility, Postural dysfunction, Increased fascial restricitons, Impaired perceived functional ability, Impaired UE functional use, Impaired  flexibility, Increased edema, Obesity  Visit Diagnosis: Postmastectomy lymphedema     Problem List Patient Active Problem List   Diagnosis Date Noted   Agnosia 08/09/2020   Hardening of the aorta (main artery of the heart) ([place]) 08/09/2020   Morbid obesity ([place]) 08/09/2020   Neuropathy 08/09/2020   Primary osteoarthritis 08/09/2020   Sensorineural hearing loss (SNHL) of both ears 08/07/2020   Anosmia 06/05/2020   Tinnitus, bilateral 06/05/2020   GI bleed 07/13/2019   Acute blood loss anemia 07/12/2019   Microcytic anemia 01/05/2018  Melena 01/04/2018   Lower GI bleed 01/04/2018   Type 2 diabetes mellitus without complication ([place]) 92/33/0076   Bacteremia due to methicillin susceptible Staphylococcus aureus (MSSA) 12/02/2017   Tenosynovitis of left wrist 12/02/2017   Infection of left wrist ([place]) 11/30/2017   Primary osteoarthritis of right hip 01/05/2017   Osteoarthritis of right hip 01/03/2017   Lymphedema of upper extremity 01/17/2014   Osteopenia 01/17/2014   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   Dilated pore of [place] 08/02/2013   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

## 2021-02-25 ENCOUNTER — Other Ambulatory Visit: Payer: Self-pay

## 2021-02-25 DIAGNOSIS — Z170 Estrogen receptor positive status [ER+]: Secondary | ICD-10-CM

## 2021-02-25 DIAGNOSIS — C50312 Malignant neoplasm of lower-inner quadrant of left female breast: Secondary | ICD-10-CM

## 2021-02-26 ENCOUNTER — Inpatient Hospital Stay: Payer: Medicare Other | Attending: Adult Health

## 2021-02-26 ENCOUNTER — Inpatient Hospital Stay: Payer: Medicare Other | Admitting: Adult Health

## 2021-02-26 ENCOUNTER — Other Ambulatory Visit: Payer: Self-pay

## 2021-02-26 ENCOUNTER — Encounter: Payer: Self-pay | Admitting: Adult Health

## 2021-02-26 VITALS — BP 181/74 | HR 85 | Temp 97.5°F | Resp 17 | Wt 215.5 lb

## 2021-02-26 DIAGNOSIS — Z853 Personal history of malignant neoplasm of breast: Secondary | ICD-10-CM | POA: Insufficient documentation

## 2021-02-26 DIAGNOSIS — C50312 Malignant neoplasm of lower-inner quadrant of left female breast: Secondary | ICD-10-CM

## 2021-02-26 DIAGNOSIS — C7989 Secondary malignant neoplasm of other specified sites: Secondary | ICD-10-CM | POA: Diagnosis not present

## 2021-02-26 DIAGNOSIS — C50912 Malignant neoplasm of unspecified site of left female breast: Secondary | ICD-10-CM | POA: Diagnosis not present

## 2021-02-26 DIAGNOSIS — M8580 Other specified disorders of bone density and structure, unspecified site: Secondary | ICD-10-CM | POA: Insufficient documentation

## 2021-02-26 DIAGNOSIS — I890 Lymphedema, not elsewhere classified: Secondary | ICD-10-CM | POA: Diagnosis not present

## 2021-02-26 LAB — CBC WITH DIFFERENTIAL (CANCER CENTER ONLY)
Abs Immature Granulocytes: 0.01 10*3/uL (ref 0.00–0.07)
Basophils Absolute: 0 10*3/uL (ref 0.0–0.1)
Basophils Relative: 1 %
Eosinophils Absolute: 0.2 10*3/uL (ref 0.0–0.5)
Eosinophils Relative: 3 %
HCT: 38.4 % (ref 36.0–46.0)
Hemoglobin: 12.4 g/dL (ref 12.0–15.0)
Immature Granulocytes: 0 %
Lymphocytes Relative: 27 %
Lymphs Abs: 1.7 10*3/uL (ref 0.7–4.0)
MCH: 25.2 pg — ABNORMAL LOW (ref 26.0–34.0)
MCHC: 32.3 g/dL (ref 30.0–36.0)
MCV: 78 fL — ABNORMAL LOW (ref 80.0–100.0)
Monocytes Absolute: 0.4 10*3/uL (ref 0.1–1.0)
Monocytes Relative: 6 %
Neutro Abs: 3.9 10*3/uL (ref 1.7–7.7)
Neutrophils Relative %: 63 %
Platelet Count: 231 10*3/uL (ref 150–400)
RBC: 4.92 MIL/uL (ref 3.87–5.11)
RDW: 15.9 % — ABNORMAL HIGH (ref 11.5–15.5)
WBC Count: 6.2 10*3/uL (ref 4.0–10.5)
nRBC: 0 % (ref 0.0–0.2)

## 2021-02-26 LAB — CMP (CANCER CENTER ONLY)
ALT: 6 U/L (ref 0–44)
AST: 11 U/L — ABNORMAL LOW (ref 15–41)
Albumin: 3.8 g/dL (ref 3.5–5.0)
Alkaline Phosphatase: 68 U/L (ref 38–126)
Anion gap: 11 (ref 5–15)
BUN: 20 mg/dL (ref 8–23)
CO2: 24 mmol/L (ref 22–32)
Calcium: 8.9 mg/dL (ref 8.9–10.3)
Chloride: 107 mmol/L (ref 98–111)
Creatinine: 0.78 mg/dL (ref 0.44–1.00)
GFR, Estimated: >60 mL/min (ref >60)
Glucose, Bld: 113 mg/dL — ABNORMAL HIGH (ref 70–99)
Potassium: 3.5 mmol/L (ref 3.5–5.1)
Sodium: 142 mmol/L (ref 135–145)
Total Bilirubin: 0.5 mg/dL (ref 0.3–1.2)
Total Protein: 7.1 g/dL (ref 6.5–8.1)

## 2021-02-26 NOTE — Progress Notes (Signed)
ID: Tami Schmidt OB: [birth year]  MR#: [id number]  CSN#:[id number]  PCP: [name], MD GYN:   SU: Tami Keens, MD OTHER MD: Kyung Rudd, MD  CHIEF COMPLAINT:  1)  Hx Right Breast Cancer    2)  Hx Left Breast Cancer, with recurrence and metastatic adenopathy  CURRENT TREATMENT: Tamoxifen, [Denosumab/prolia]  BREAST CANCER HISTORY: From the earlier summary:  Tami Schmidt's history of breast cancer dates back to 1993, when she underwent biopsy of her right breast in [place]. I do not have those details, but she moved to [place] shortly thereafter and it was in [place] where she had her right lumpectomy and axillary lymph node dissection. She had a total of 6 lymph nodes removed, she tells me, and all of them were clear. She does not recall the size of the primary. She was treated with adjuvant radiation and then received tamoxifen between 1993 and 1998.  In 2002 she had a local recurrence in the right breast, and underwent right mastectomy. She received tamoxifen between 2002 and 2007.  In 2004 she underwent a left mastectomy and sentinel lymph node sampling for a 9 mm mucinous carcinoma, grade 2, multifocal, associated with ductal carcinoma in situ. The single sentinel lymph node was negative. Margins were ample. She did not receive adjuvant radiation, but continued on tamoxifen as noted above, until 2007.  More recently, in July of 2014, the patient noted a "pimple" in the left chest wall, superior to her prior mastectomy incision. She brought this to the attention of her surgeon, Dr. Ninfa Linden and he describes a dimple area with a firm mass underneath. He also describes a large lipoma in the left clavicular area and a second one on her abdominal wall. Both of these were symptomatic. All 3 masses were removed 01/26/2013. The pathology from this procedure ([id number]) showed, in the chest wall, and invasive ductal carcinoma measuring grossly 1.3 cm. The lesion abutted the closest  inked resection margin. The other 2 masses proved to be lipomas.  The patient's subsequent history is as detailed below  INTERVAL HISTORY: Tami Schmidt returns today for evaluation of her history of breast cancer and new left arm lymphedema.  She notes that about 2 months ago her left arm began to swell.  Please note that she has a history of breast cancer with left mastectomy in 2004.  She then had a recurrence approximately 10 to 12 years later and underwent reexcision.  She notes that she has had a little bit of nodularity in the left chest wall however she notes that it has gotten worse, and now she has a new onset of left upper extremity lymphedema.  Overall she says that she is feeling well today.  She continues to live at home with her husband and remains active and independent.  REVIEW OF SYSTEMS: Review of Systems  Constitutional:  Negative for appetite change, chills, fatigue, fever and unexpected weight change.  HENT:   Negative for hearing loss, lump/mass and trouble swallowing.   Eyes:  Negative for eye problems and icterus.  Respiratory:  Negative for chest tightness, cough and shortness of breath.   Cardiovascular:  Negative for chest pain, leg swelling and palpitations.  Gastrointestinal:  Negative for abdominal distention, abdominal pain, constipation, diarrhea, nausea and vomiting.  Endocrine: Negative for hot flashes.  Genitourinary:  Negative for difficulty urinating.   Musculoskeletal:  Negative for arthralgias.  Skin:  Negative for itching and rash.  Neurological:  Negative for dizziness, extremity weakness, headaches and numbness.  Hematological:  Negative for adenopathy. Does not bruise/bleed easily.  Psychiatric/Behavioral:  Negative for depression. The patient is not nervous/anxious.      PAST MEDICAL HISTORY: Past Medical History:  Diagnosis Date   Arthritis    Breast cancer ([place])    b/l mastectomies hx   Cancer ([place])    breast   Carcinoma metastatic to lymph  node ([place]) 03/25/2013   Diabetes mellitus    fasting 90-100   HTN (hypertension) 03/25/2013   Hx of radiation therapy    breasts hx   Hypercholesterolemia    Hypertension    Lymphedema of arm    left arm   Type II or unspecified type diabetes mellitus without mention of complication, not stated as uncontrolled 03/25/2013    PAST SURGICAL HISTORY: Past Surgical History:  Procedure Laterality Date   ABDOMINAL HYSTERECTOMY     ANKLE SURGERY Right 1995   APPENDECTOMY     BIOPSY  01/07/2018   Procedure: BIOPSY;  Surgeon: [name], MD;  [facility];  Service: Gastroenterology;;   BREAST SURGERY Bilateral    mastectomy   COLONOSCOPY WITH PROPOFOL N/A 01/07/2018   Procedure: COLONOSCOPY WITH PROPOFOL;  Surgeon: [name], MD;  [facility];  Service: Gastroenterology;  Laterality: N/A;   ESOPHAGOGASTRODUODENOSCOPY (EGD) WITH PROPOFOL N/A 01/06/2018   Procedure: ESOPHAGOGASTRODUODENOSCOPY (EGD) WITH PROPOFOL;  Surgeon: [name], MD;  [facility];  Service: Gastroenterology;  Laterality: N/A;   HERNIA REPAIR  04-26-2010   MASS EXCISION Left 01/26/2013   Procedure: EXCISION LEFT CHEST WALL MASS AND LEFT ABDOMNAL WALL MASS;  Surgeon: Harl Bowie, MD;  Location: [facility];  Service: General;  Laterality: Left;   MASS EXCISION Left 09/20/2014   Procedure: EXCISION OF LEFT CHEST WALL MASS;  Surgeon: Tami Keens, MD;  Location: [facility];  Service: General;  Laterality: Left;   TEE WITHOUT CARDIOVERSION N/A 12/08/2017   Procedure: TRANSESOPHAGEAL ECHOCARDIOGRAM (TEE);  Surgeon: Lelon Perla, MD;  Location: [facility];  Service: Cardiovascular;  Laterality: N/A;   TOTAL HIP ARTHROPLASTY Right 01/05/2017   TOTAL HIP ARTHROPLASTY Right 01/05/2017   Procedure: TOTAL HIP ARTHROPLASTY ANTERIOR APPROACH;  Surgeon: Frederik Pear, MD;  Location: [facility];  Service: Orthopedics;  Laterality: Right;    FAMILY HISTORY The patient's father died  from a heart attack at the age of 61. The patient's mother died at the age of 85 from myocarditis. The patient had no brothers, one sister. There is no history of breast or ovarian cancer in the family to her knowledge  GYNECOLOGIC HISTORY:   (Reviewed 09/22/2013) Menarche age 65, first live birth age 64, the patient is Tami Schmidt P5. She underwent hysterectomy in 1984. She did not use hormone replacement.  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 Her husband Radiographer, therapeutic used to work in Radio [employer] but is now retired. Their children are: Reed Breech who lives in [place] and teaches theater there; Julius Bowels who works as a Social worker in [place]; Archivist who lives in [place] and is a "radio personality"; Elta Guadeloupe, who is a Biomedical scientist in [place], and Legrand Como who is a musician living in [place]. The patient has 2 grandchildren. She attends a [employer]    ADVANCED DIRECTIVES: Not in place   HEALTH MAINTENANCE:  (Updated 09/22/2013) Social History   Tobacco Use   Smoking status: Former    Types: Cigarettes    Quit date: 04/14/1972    Years since quitting: 28.9  Smokeless tobacco: Never  Vaping Use   Vaping Use: Never used  Substance Use Topics   Alcohol use: Yes    Comment: occasional   Drug use: No    Colonoscopy: 2011  PAP: Status post hysterectomy  Bone density: 1993 ; repeat 12/27/2013 showed osteopenia with a T score of -1.8  Lipid panel: not on file  Allergies  Allergen Reactions   Bactrim Swelling    SWELLING OF MOUTH/FACE.   Lisinopril Swelling    SWELLING OF MOUTH/FACE.   Vasotec Swelling    SWELLING OF MOUTH/FACE.   Sulfamethoxazole-Trimethoprim     Other reaction(s): Unknown    Current Outpatient Medications  Medication Sig Dispense Refill   amLODipine (NORVASC) 10 MG tablet Take 10 mg by mouth daily with breakfast.      diclofenac Sodium (VOLTAREN) 1 % GEL Apply 2 g topically daily as needed (For topical arthritis.).      fluticasone (FLONASE) 50 MCG/ACT nasal spray Place into both nostrils.     gabapentin (NEURONTIN) 300 MG capsule Take 600 mg by mouth daily.     gabapentin (NEURONTIN) 400 MG capsule Take 400 mg by mouth 3 (three) times daily.     HUMULIN 70/30 (70-30) 100 UNIT/ML injection Inject 30 unit subcutaneously once a day     HUMULIN N 100 UNIT/ML injection Inject 25-30 Units into the skin daily.      metFORMIN (GLUCOPHAGE) 500 MG tablet Take 500 mg by mouth daily with breakfast.     NONFORMULARY OR COMPOUNDED ITEM Antifungal solution: Terbinafine 3%, Fluconazole 2%, Tea Tree Oil 5%, Urea 10%, Ibuprofen 2% in DMSO suspension #53m 1 each 3   olmesartan-hydrochlorothiazide (BENICAR HCT) 40-12.5 MG tablet Take 1 tablet by mouth daily.     pregabalin (LYRICA) 75 MG capsule Take 75 mg by mouth 2 (two) times daily.     SM IRON 325 (65 Fe) MG tablet Take 325 mg by mouth daily.     TYLENOL 325 MG tablet Take 325 mg by mouth daily.     venlafaxine (EFFEXOR) 37.5 MG tablet Take 37.5 mg by mouth daily as needed.     No current facility-administered medications for this visit.    OBJECTIVE: Older African American woman in no acute distress  Vitals:   02/26/21 1006  BP: (!) 181/74  Pulse: 85  Resp: 17  Temp: (!) 97.5 F (36.4 C)  SpO2: 100%     Body mass index is 34.78 kg/m.    ECOG FS: 2 Filed Weights   02/26/21 1006  Weight: 215 lb 8 oz (97.8 kg)   GENERAL: Patient is a well appearing female in no acute distress HEENT:  Sclerae anicteric.  Oropharynx clear and moist. No ulcerations or evidence of oropharyngeal candidiasis. Neck is supple.  NODES:  No cervical, supraclavicular, or axillary lymphadenopathy palpated.  BREAST EXAM: Status post bilateral mastectomies.  There is some mild nodularity in the left upper outer chest wall.  I could not feel any lymphadenopathy however her left arm is positive for lymphedema.  Her right mastectomy site is benign LUNGS:  Clear to auscultation bilaterally.  No  wheezes or rhonchi. HEART:  Regular rate and rhythm. No murmur appreciated. ABDOMEN:  Soft, nontender.  Positive, normoactive bowel sounds. No organomegaly palpated. MSK:  No focal spinal tenderness to palpation. Full range of motion bilaterally in the upper extremities. EXTREMITIES: Positive left arm swelling SKIN:  Clear with no obvious rashes or skin changes. No nail dyscrasia. NEURO:  Nonfocal. Well oriented.  Appropriate  affect.   LAB RESULTS:  Lab Results  Component Value Date   WBC 6.2 02/26/2021   NEUTROABS 3.9 02/26/2021   HGB 12.4 02/26/2021   HCT 38.4 02/26/2021   MCV 78.0 (L) 02/26/2021   PLT 231 02/26/2021      Chemistry      Component Value Date/Time   NA 142 07/14/2019 0538   NA 139 01/20/2017 1057   K 3.5 07/14/2019 0538   K 3.9 01/20/2017 1057   CL 108 07/14/2019 0538   CO2 25 07/14/2019 0538   CO2 25 01/20/2017 1057   BUN 10 07/14/2019 0538   BUN 15.6 01/20/2017 1057   CREATININE 0.65 07/14/2019 0538   CREATININE 0.7 01/20/2017 1057      Component Value Date/Time   CALCIUM 8.3 (L) 07/14/2019 0538   CALCIUM 9.3 01/20/2017 1057   ALKPHOS 42 07/12/2019 1121   ALKPHOS 69 01/20/2017 1057   AST 15 07/12/2019 1121   AST 10 01/20/2017 1057   ALT 10 07/12/2019 1121   ALT 14 01/20/2017 1057   BILITOT 0.3 07/12/2019 1121   BILITOT 0.43 01/20/2017 1057      STUDIES: No results found.    ASSESSMENT: 87 y.o. [place] woman  (1) status post right lumpectomy and axillary dissection in 1993 in [place] for what appears to have been a stage I breast cancer, treated with radiation and then tamoxifen for 5 years  (2) status post right mastectomy 2002 for a recurrence in the right breast, treated adjuvantly with tamoxifen for an additional 5 years  (3) status post left mastectomy and sentinel lymph node sampling 03/07/2003 for a lower inner quadrant pT1b, pN0, stage IA mucinous breast cancer, grade 2; no radiation therapy was given. Tamoxifen was continued  until 2009  RECURRENT DISEASE: OCTOBER 2014 (4) excisional biopsy of a left chest wall mass 01/26/2013 showing carcinoma consistent with invasive ductal carcinoma, estrogen receptor 100% positive, progesterone receptor 0% positive, with an MIB-1 of 22% and no HER-2 amplification.   (5)  PET scan on 03/03/2013 confirmed extensive metastatic adenopathy in the left axilla, left subpectoral musculature, and left supraclavicular region  (6)  started on anastrozole daily beginning 03/01/2013, interrupted during radiation therapy, resumed March 2015, discontinued March 2016 because of fatigue and arthralgias  (7) radiation therapy 03/31/2013 through 05/23/2013 Site/dose:   The patient was treated initially with a forward treatment planning technique to the left chest wall in addition to treatment to the supraclavicular region. This consisted of a 3-D conformal technique. The patient was treated in this fashion to a dose of 50.4 gray. The patient then received a 14 gray boost treatment using an electron field. The total dose was 64.4 gray.  (8) Osteopenia, zometa yearly started 07/27/2014, but poorly tolerated.   (a) bone density scan on 12/27/13 showed a t-score of -1.8   (b) started Denosumab/Prolia April 2017, canceled after one dose per patient  (9) tamoxifen started 07/27/2014, stopped November 2019 (had total 5 years of antiestrogens)  (10) likely thalassemia trait  (11) left subpectoral soft tissue nodule noted 07/17/2015 unchanged through serial scans, most recent 01/75/1025, with no metabolic activity noted in PET scan of 2016 and interval calcification.  This is presumed benign  PLAN:  Tami Schmidt is here today for evaluation of her left chest wall worsening nodularity and new onset left arm lymphedema.  I reviewed with her that this could be sequela from her treatment back in 2004 and then 2014 with her radiation, however considering increasing nodularity and recent  onset we need to gather  further imaging to be sure.  I placed orders for an ultrasound of the chest wall to be completed at [facility].  Her previous imaging was completed in [place] prior to her moving up to [place].  I also placed orders for a CT scan of the chest with contrast to evaluate for any signs of recurrence.  I reviewed the above with Tami Schmidt in detail.  She is in agreement with the plan.  She will return in approximately 4 weeks to review the results and for labs and follow-up with Dr. Jana Hakim.  She knows to call if any issues should arise between now and her next visit.  We are always happy to see her sooner if needed.  Total encounter time: 20 minutes in face-to-face visit time, chart review, lab review, order entry, care coordination, and documentation of the encounter.    Tami Bihari, NP 02/26/21 10:13 AM Medical Oncology and Hematology [facility] [place], [place] [address] Tel. [phone]    Fax. [phone]  *Total Encounter Time as defined by the Centers for Medicare and Medicaid Services includes, in addition to the face-to-face time of a patient visit (documented in the note above) non-face-to-face time: obtaining and reviewing outside history, ordering and reviewing medications, tests or procedures, care coordination (communications with other health care professionals or caregivers) and documentation in the medical record.

## 2021-02-27 ENCOUNTER — Telehealth: Payer: Self-pay | Admitting: Oncology

## 2021-02-27 NOTE — Telephone Encounter (Signed)
Scheduled appointment per 11/15 los. Patient aware.

## 2021-02-28 ENCOUNTER — Other Ambulatory Visit: Payer: Self-pay

## 2021-02-28 ENCOUNTER — Ambulatory Visit: Payer: Medicare Other

## 2021-02-28 DIAGNOSIS — I972 Postmastectomy lymphedema syndrome: Secondary | ICD-10-CM | POA: Diagnosis not present

## 2021-02-28 DIAGNOSIS — M6281 Muscle weakness (generalized): Secondary | ICD-10-CM

## 2021-02-28 DIAGNOSIS — M25612 Stiffness of left shoulder, not elsewhere classified: Secondary | ICD-10-CM

## 2021-02-28 DIAGNOSIS — R293 Abnormal posture: Secondary | ICD-10-CM

## 2021-02-28 DIAGNOSIS — M25611 Stiffness of right shoulder, not elsewhere classified: Secondary | ICD-10-CM

## 2021-02-28 NOTE — Therapy (Signed)
[place] @ [facility] [address], [place], [address] Phone: [phone]   Fax:  [phone]  Physical Therapy Treatment  Patient Details  Name: Tami Schmidt MRN: [id number] Date of Birth: 1932/05/09 Referring Provider (PT): magrinat   Encounter Date: 02/28/2021   PT End of Session - 02/28/21 0958     Visit Number 6    Number of Visits 24    Date for PT Re-Evaluation 04/01/21    PT Start Time 0909    PT Stop Time 0939    PT Time Calculation (min) 30 min    Activity Tolerance Patient tolerated treatment well    Behavior During Therapy [facility] for tasks assessed/performed             Past Medical History:  Diagnosis Date   Arthritis    Breast cancer ([place])    b/l mastectomies hx   Cancer ([place])    breast   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[place]) 03/25/2013   Diabetes mellitus    fasting 90-100   HTN (hypertension) 03/25/2013   Hx of radiation therapy    breasts hx   Hypercholesterolemia    Hypertension    Lymphedema of arm    left arm   Type II or unspecified type diabetes mellitus without mention of complication, not stated as uncontrolled 03/25/2013    Past Surgical History:  Procedure Laterality Date   ABDOMINAL HYSTERECTOMY     ANKLE SURGERY Right 1995   APPENDECTOMY     BIOPSY  01/07/2018   Procedure: BIOPSY;  Surgeon: [name], MD;  Location: [facility];  Service: Gastroenterology;;   BREAST SURGERY Bilateral    mastectomy   COLONOSCOPY WITH PROPOFOL N/A 01/07/2018   Procedure: COLONOSCOPY WITH PROPOFOL;  Surgeon: [name], MD;  Location: [facility];  Service: Gastroenterology;  Laterality: N/A;   ESOPHAGOGASTRODUODENOSCOPY (EGD) WITH PROPOFOL N/A 01/06/2018   Procedure: ESOPHAGOGASTRODUODENOSCOPY (EGD) WITH PROPOFOL;  Surgeon: [name], MD;  Location: [facility];  Service: Gastroenterology;  Laterality: N/A;   HERNIA REPAIR  04-26-2010   MASS EXCISION Left 01/26/2013   Procedure:  EXCISION LEFT CHEST WALL MASS AND LEFT ABDOMNAL WALL MASS;  Surgeon: Harl Bowie, MD;  Location: [place];  Service: General;  Laterality: Left;   MASS EXCISION Left 09/20/2014   Procedure: EXCISION OF LEFT CHEST WALL MASS;  Surgeon: Coralie Keens, MD;  Location: [place];  Service: General;  Laterality: Left;   TEE WITHOUT CARDIOVERSION N/A 12/08/2017   Procedure: TRANSESOPHAGEAL ECHOCARDIOGRAM (TEE);  Surgeon: Lelon Perla, MD;  Location: [place];  Service: Cardiovascular;  Laterality: N/A;   TOTAL HIP ARTHROPLASTY Right 01/05/2017   TOTAL HIP ARTHROPLASTY Right 01/05/2017   Procedure: TOTAL HIP ARTHROPLASTY ANTERIOR APPROACH;  Surgeon: Frederik Pear, MD;  Location: [place];  Service: Orthopedics;  Laterality: Right;    There were no vitals filed for this visit.   Subjective Assessment - 02/28/21 0920     Subjective My husband wrapped my arm for the weekend. It was a little tight. My overnight velcro sleeve came in yesterday and I slept in it last night and that felt good. It fit good and my hand is down this morning. My CT scan is scheduled for Tuesday.    Pertinent History right breast cancer in 1993 with lumpectomy, 6 nodes and radiation, She had recurrance of right breast cancer in 2002 with mastectomy, left breast cancer in 2004 with mastectomy with one node removal She has  left chest wall cancer in 2014 with removal and deloped lymphedema in her left arm after that surgery .  Past history includes right THR and diabetes    Patient Stated Goals to get her arm reduced and get a new sleeve.    Currently in Pain? No/denies                   LYMPHEDEMA/ONCOLOGY QUESTIONNAIRE - 02/28/21 0001       Left Upper Extremity Lymphedema   15 cm Proximal to Olecranon Process 42.4 cm    10 cm Proximal to Olecranon Process 39.8 cm    Olecranon Process 28.5 cm    15 cm Proximal to Ulnar Styloid Process 29.7 cm    10 cm Proximal to Ulnar Styloid Process 27.5 cm     Just Proximal to Ulnar Styloid Process 19.8 cm    Across Hand at [employer] 19.4 cm    At [place] of 2nd Digit 6.6 cm                        [facility] Adult PT Treatment/Exercise - 02/28/21 0001       Self-Care   Other Self-Care Comments  Pt reports her new nighttime garment came yesterday so she was able to sleep in this last night and noticed great results this morning. Discussed continuing bandaging vs wearing her new nighttime during the day some until her new Elvarex comes in and pt prefers the latter option. She reports won't be able to wear the garment all day because it's hard to move arm as easily with ADLs but will wear it as much as able over next few days until her new Elvarex sleeve and glove arrives. She comes in wearing her old compression sleeve which has very little elasticity to it but pt reports she is able to drive with this and will wear it only when she can't wear the nighttime until elvarex arrives.      Manual Therapy   Manual Therapy Edema management    Edema Management Remeasured circumference and assessed fibrosis in forearm which pe, per pt, feels some improved from wering nighttime garment last night.                       PT Short Term Goals - 01/28/21 1329       PT SHORT TERM GOAL #1   Title Pt will have decrease in circumference of left forearm at 10 cm proximal to the ulnar styloid by 2 cm.    Baseline 22 cm on 01/28/2021    Time 4    Period Weeks    Status New               PT Long Term Goals - 01/28/21 1330       PT LONG TERM GOAL #1   Title Pt will be able to manage her lymphedema at home with compression pump or self MLD, compression garments and exercise    Time 8    Period Weeks    Status New      PT LONG TERM GOAL #2   Title Pt will be independent in a home exercise program for shoulder ROM and strength    Time 8    Period Weeks    Status New                   Plan - 02/28/21 4174  Clinical Impression Statement Pt comes in feeling encouraged about how much better her arm feels. Her nighttime garment arrived yestreday so she slept in this last night and reports being able to note great improvement in her arm and nore so in her hand this morning. Her circumference measurements were also greatly reduced in UE except at forearm, but had great reductions in her wrist so this could account for mild change and slight increase at foreram. Pt requests not being bandaged as it is harder to move arm than usual with bandages on so instead encouraged her to wear nighttime garment as much as able, removing for ADLs, until her new elvarex arrives which should be in next few days. Pt verbalized good understanding. She is now on hold until having CT scan next week and finding out results to know what, if any, plan we can pursue with safely depending on medical status. Pt verbalizes understanding of this as well and will call to let us know or have doctor pet [place] know.    Personal Factors and Comorbidities Age;Comorbidity 3+    Comorbidities multiple surgeries for cance, lymphedema in left arm, arthritis, DM, increased age with decreased fitness    Examination-Activity Limitations Reach Overhead;Caring for Others;Carry    Stability/Clinical Decision Making Stable/Uncomplicated    Rehab Potential Good    PT Frequency 3x / week    PT Duration 8 weeks    PT Treatment/Interventions ADLs/Self Care Home Management;Functional mobility training;Therapeutic activities;Therapeutic exercise;Neuromuscular re-education;Patient/family education;Orthotic Fit/Training;Manual techniques;Manual lymph drainage;Compression bandaging;Scar mobilization;Passive range of motion;Taping    PT Next Visit Plan Pt on hold until getting results of next weeks CT scan.    Consulted and Agree with Plan of Care Patient             Patient will benefit from skilled therapeutic intervention in order to improve the following  deficits and impairments:  Abnormal gait, Decreased range of motion, Decreased strength, Decreased endurance, Decreased balance, Decreased scar mobility, Postural dysfunction, Increased fascial restricitons, Impaired perceived functional ability, Impaired UE functional use, Impaired flexibility, Increased edema, Obesity  Visit Diagnosis: Postmastectomy lymphedema  Muscle weakness (generalized)  Stiffness of right shoulder, not elsewhere classified  Stiffness of left shoulder, not elsewhere classified  Abnormal posture     Problem List Patient Active Problem List   Diagnosis Date Noted   Agnosia 08/09/2020   Hardening of the aorta (main artery of the heart) ([place]) 08/09/2020   Morbid obesity ([place]) 08/09/2020   Neuropathy 08/09/2020   Primary osteoarthritis 08/09/2020   Sensorineural hearing loss (SNHL) of both ears 08/07/2020   Anosmia 06/05/2020   Tinnitus, bilateral 06/05/2020   GI bleed 07/13/2019   Acute blood loss anemia 07/12/2019   Microcytic anemia 01/05/2018   Melena 01/04/2018   Lower GI bleed 01/04/2018   Type 2 diabetes mellitus without complication ([place]) 34/28/7681   Bacteremia due to methicillin susceptible Staphylococcus aureus (MSSA) 12/02/2017   Tenosynovitis of left wrist 12/02/2017   Infection of left wrist ([place]) 11/30/2017   Primary osteoarthritis of right hip 01/05/2017   Osteoarthritis of right hip 01/03/2017   Lymphedema of upper extremity 01/17/2014   Osteopenia 01/17/2014   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   Dilated pore of [place] 08/02/2013   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 08/02/2013   Scar 08/02/2013   Malignant neoplasm of lower-inner quadrant of left breast in female, estrogen receptor positive ([place]) 06/16/2013   Type II or unspecified type diabetes mellitus without mention of complication, not  stated as uncontrolled 03/25/2013   Essential hypertension 03/25/2013   Chest wall recurrence of breast  cancer ([place]) 02/21/2013   Abdominal wall mass 01/14/2013    Otelia Limes, PTA 02/28/2021, 10:05 AM  [place] @ [place], [place], [address] Phone: [phone]   Fax:  [phone]  Name: Tami Schmidt MRN: [id number] Date of Birth: Nov 11, 1932

## 2021-03-05 ENCOUNTER — Ambulatory Visit (HOSPITAL_COMMUNITY)
Admission: RE | Admit: 2021-03-05 | Discharge: 2021-03-05 | Disposition: A | Discharge status: Home / Self Care | Payer: Medicare Other | Source: Ambulatory Visit | Attending: Adult Health | Admitting: Adult Health

## 2021-03-05 ENCOUNTER — Other Ambulatory Visit: Payer: Self-pay

## 2021-03-05 DIAGNOSIS — C50912 Malignant neoplasm of unspecified site of left female breast: Secondary | ICD-10-CM | POA: Insufficient documentation

## 2021-03-05 DIAGNOSIS — C7989 Secondary malignant neoplasm of other specified sites: Secondary | ICD-10-CM | POA: Diagnosis present

## 2021-03-05 MED ORDER — SODIUM CHLORIDE (PF) 0.9 % IJ SOLN
INTRAMUSCULAR | Status: AC
  Filled 2021-03-05: qty 50

## 2021-03-05 MED ORDER — IOHEXOL 350 MG/ML SOLN
65.0000 mL | Freq: Once | INTRAVENOUS | Status: AC | PRN
  Administered 2021-03-05: 65 mL via INTRAVENOUS

## 2021-03-06 ENCOUNTER — Telehealth: Payer: Self-pay | Admitting: *Deleted

## 2021-03-06 ENCOUNTER — Other Ambulatory Visit (HOSPITAL_COMMUNITY): Payer: Self-pay

## 2021-03-06 ENCOUNTER — Other Ambulatory Visit: Payer: Self-pay | Admitting: Oncology

## 2021-03-06 ENCOUNTER — Encounter: Payer: Self-pay | Admitting: Oncology

## 2021-03-06 ENCOUNTER — Inpatient Hospital Stay (HOSPITAL_BASED_OUTPATIENT_CLINIC_OR_DEPARTMENT_OTHER): Payer: Medicare Other | Admitting: Physician Assistant

## 2021-03-06 VITALS — BP 152/62 | HR 86 | Temp 98.0°F | Resp 18 | Ht 66.0 in | Wt 215.3 lb

## 2021-03-06 DIAGNOSIS — C50912 Malignant neoplasm of unspecified site of left female breast: Secondary | ICD-10-CM | POA: Diagnosis not present

## 2021-03-06 DIAGNOSIS — C50312 Malignant neoplasm of lower-inner quadrant of left female breast: Secondary | ICD-10-CM | POA: Diagnosis not present

## 2021-03-06 DIAGNOSIS — R591 Generalized enlarged lymph nodes: Secondary | ICD-10-CM | POA: Diagnosis not present

## 2021-03-06 DIAGNOSIS — Z853 Personal history of malignant neoplasm of breast: Secondary | ICD-10-CM | POA: Diagnosis not present

## 2021-03-06 DIAGNOSIS — C7989 Secondary malignant neoplasm of other specified sites: Secondary | ICD-10-CM

## 2021-03-06 DIAGNOSIS — I2693 Single subsegmental pulmonary embolism without acute cor pulmonale: Secondary | ICD-10-CM

## 2021-03-06 DIAGNOSIS — Z170 Estrogen receptor positive status [ER+]: Secondary | ICD-10-CM

## 2021-03-06 MED ORDER — APIXABAN (ELIQUIS) VTE STARTER PACK (10MG AND 5MG)
ORAL_TABLET | ORAL | 0 refills | Status: DC
  Filled 2021-03-06: qty 74, 28d supply, fill #0

## 2021-03-06 NOTE — Progress Notes (Signed)
Vaccine appears to have recurrent breast cancer.  We do need to confirm that by biopsy and confirm that it is still estrogen receptor positive.  If so we will start her on Faslodex 04/02/2021 when she returns to see me.  She also has a PE.  We are starting her on apixaban today.

## 2021-03-06 NOTE — Progress Notes (Signed)
Symptom Management Consult note [place]    Patient Care Team: [name], MD as PCP - General (Family Medicine) Shellye Zandi, Virgie Dad, MD as Consulting Physician (Oncology) Gentry Fitz, MD as Consulting Physician (Family Medicine)    Name of the patient: Tami Schmidt  [id number]  [birth year]   Date of visit: 03/06/2021    Chief complaint/ Reason for visit- discuss results of CTA  Oncology History   No history exists.    Current Therapy: no current therapy. Finished tamoxifen 4-5 years ago per patient  Interval history- Yareni B. Vincelette is an 87 yo female with history of right breast cancer and left breast cancer with recurrence and metastatic disease, lower GI bleed likely diverticular bleed in 2021, diabetes presenting to [facility] today to follow up on imaging results from the CTA chest performed yesterday.  Patient seen yesterday at [facility] by Mendel Ryder NP. She was presenting with one month of left upper extremity swelling without associated pain or injury. CTA results show PE in right upper lobe as well as left axillary and subpectoral lymphadenopathy concerning for metastatic disease. Patient admits to me that she has been experiencing fatigue for the last x 2 years and has mentioned it to PCP in the past. She describes it as needing to take frequent breaks when performing tasks such as making the bed. After resting for a couple of minutes she is able to get up and complete activity. She denies any change in fatigue recently. She denies fever, chills, cough, hemoptysis, chest pain, shortness of breath, blood in stool, leg swelling.    ROS  All other systems are reviewed and are negative for acute change except as noted in the HPI.    Allergies  Allergen Reactions   Bactrim Swelling    SWELLING OF MOUTH/FACE.   Lisinopril Swelling    SWELLING OF MOUTH/FACE.   Vasotec Swelling    SWELLING OF MOUTH/FACE.    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   Diabetes mellitus    fasting 90-100   HTN (hypertension) 03/25/2013   Hx of radiation therapy    breasts hx   Hypercholesterolemia    Hypertension    Lymphedema of arm    left arm   Type II or unspecified type diabetes mellitus without mention of complication, not stated as uncontrolled 03/25/2013     Past Surgical History:  Procedure Laterality Date   ABDOMINAL HYSTERECTOMY     ANKLE SURGERY Right 1995   APPENDECTOMY     BIOPSY  01/07/2018   Procedure: BIOPSY;  Surgeon: [name], MD;  Location: [facility];  Service: Gastroenterology;;   BREAST SURGERY Bilateral    mastectomy   COLONOSCOPY WITH PROPOFOL N/A 01/07/2018   Procedure: COLONOSCOPY WITH PROPOFOL;  Surgeon: [name], MD;  Location: [facility];  Service: Gastroenterology;  Laterality: N/A;   ESOPHAGOGASTRODUODENOSCOPY (EGD) WITH PROPOFOL N/A 01/06/2018   Procedure: ESOPHAGOGASTRODUODENOSCOPY (EGD) WITH PROPOFOL;  Surgeon: [name], MD;  Location: [facility];  Service: Gastroenterology;  Laterality: N/A;   HERNIA REPAIR  04-26-2010   MASS EXCISION Left 01/26/2013   Procedure: EXCISION LEFT CHEST WALL MASS AND LEFT ABDOMNAL WALL MASS;  Surgeon: Harl Bowie, MD;  Location: [place];  Service: General;  Laterality: Left;   MASS EXCISION Left 09/20/2014   Procedure: EXCISION OF LEFT CHEST WALL MASS;  Surgeon: Coralie Keens, MD;  Location: [place];  Service: General;  Laterality: Left;   TEE WITHOUT CARDIOVERSION N/A 12/08/2017   Procedure: TRANSESOPHAGEAL ECHOCARDIOGRAM (TEE);  Surgeon: Lelon Perla, MD;  Location: [place];  Service: Cardiovascular;  Laterality: N/A;   TOTAL HIP ARTHROPLASTY Right 01/05/2017   TOTAL HIP ARTHROPLASTY  Right 01/05/2017   Procedure: TOTAL HIP ARTHROPLASTY ANTERIOR APPROACH;  Surgeon: Frederik Pear, MD;  Location: [place];  Service: Orthopedics;  Laterality: Right;    Social History   Socioeconomic History   Marital status: Married    Spouse name: Not on file   Number of children: Not on file   Years of education: Not on file   Highest education level: Not on file  Occupational History   Occupation: retired Education officer, [employer]  Tobacco Use   Smoking status: Former    Types: Cigarettes    Quit date: 04/14/1972    Years since quitting: 48.9   Smokeless tobacco: Never  Vaping Use   Vaping Use: Never used  Substance and Sexual Activity   Alcohol use: Yes    Comment: occasional   Drug use: No   Sexual activity: Yes    Birth control/protection: Surgical  Other Topics Concern   Not on file  Social History Narrative   Not on file   Social Determinants of Health   Financial Resource Strain: Not on file  Food Insecurity: Not on file  Transportation Needs: Not on file  Physical Activity: Not on file  Stress: Not on file  Social Connections: Not on file  Intimate Partner Violence: Not on file    No family history on file.   Current Outpatient Medications:    APIXABAN (ELIQUIS) VTE STARTER PACK (10MG  AND 5MG ), Take as directed on package: start with two-5mg  tablets twice daily for 7 days. On day 8, switch to one-5mg  tablet twice daily., Disp: 74 each, Rfl: 0   amLODipine (NORVASC) 10 MG tablet, Take 10 mg by mouth daily with breakfast. , Disp: , Rfl:    diclofenac Sodium (VOLTAREN) 1 % GEL, Apply 2 g topically daily as needed (For topical arthritis.)., Disp: , Rfl:    HUMULIN N 100 UNIT/ML injection, Inject 25-30 Units into the skin daily. , Disp: , Rfl:    metFORMIN (GLUCOPHAGE) 500 MG tablet, Take 500 mg by mouth daily with breakfast., Disp: , Rfl:    olmesartan-hydrochlorothiazide (BENICAR HCT) 40-12.5 MG tablet, Take 1 tablet by mouth daily., Disp: , Rfl:    SM IRON 325 (65 Fe) MG  tablet, Take 325 mg by mouth daily., Disp: , Rfl:    TYLENOL 325 MG tablet, Take 325 mg by mouth daily., Disp: , Rfl:   PHYSICAL EXAM: ECOG FS:1 - Symptomatic but completely ambulatory    Vitals:   03/06/21 1405  BP: (!) 152/62  Pulse: 86  Resp: 18  Temp: 98 F (36.7 C)  TempSrc: Oral  SpO2: 98%  Weight: 215 lb 4.8 oz (97.7 kg)  Height: 5\' 6"  (1.676 m)   Physical Exam Vitals and nursing note reviewed.  Constitutional:      Appearance: She is well-developed. She is not ill-appearing or toxic-appearing.  HENT:     Head: Normocephalic and atraumatic.     Nose: Nose normal.  Eyes:     General: No scleral icterus.       Right eye: No  discharge.        Left eye: No discharge.     Conjunctiva/sclera: Conjunctivae normal.  Neck:     Vascular: No JVD.  Cardiovascular:     Rate and Rhythm: Normal rate and regular rhythm.     Pulses: Normal pulses.     Heart sounds: Normal heart sounds.  Pulmonary:     Effort: Pulmonary effort is normal.     Breath sounds: Normal breath sounds.  Chest:     Chest wall: No tenderness.     Comments: S/p Bilateral mastectomy. Left lymphadenopathy not able to be appreciated on exam Abdominal:     General: There is no distension.  Musculoskeletal:        General: Normal range of motion.     Cervical back: Normal range of motion.     Right lower leg: No edema.     Left lower leg: No edema.     Comments: Swelling of left upper extremity with soft compartments. Strong radial pulses bilaterally  Lymphadenopathy:     Upper Body:     Right upper body: No axillary or pectoral adenopathy.     Left upper body: No axillary or pectoral adenopathy.  Skin:    General: Skin is warm and dry.     Capillary Refill: Capillary refill takes less than 2 seconds.     Comments: Equal tactile temperature in all extremities.  Neurological:     Mental Status: She is oriented to person, place, and time.     GCS: GCS eye subscore is 4. GCS verbal subscore is 5. GCS  motor subscore is 6.     Comments: Fluent speech, no facial droop.  Psychiatric:        Behavior: Behavior normal.       LABORATORY DATA: I have reviewed the data as listed CBC Latest Ref Rng & Units 02/26/2021 07/16/2019 07/15/2019  WBC 4.0 - 10.5 K/uL 6.2 7.4 9.7  Hemoglobin 12.0 - 15.0 g/dL 12.4 9.5(L) 10.8(L)  Hematocrit 36.0 - 46.0 % 38.4 29.2(L) 32.7(L)  Platelets 150 - 400 K/uL 231 180 188     CMP Latest Ref Rng & Units 02/26/2021 07/14/2019 07/13/2019  Glucose 70 - 99 mg/dL 113(H) 107(H) 116(H)  BUN 8 - 23 mg/dL 20 10 13   Creatinine 0.44 - 1.00 mg/dL 0.78 0.65 0.60  Sodium 135 - 145 mmol/L 142 142 140  Potassium 3.5 - 5.1 mmol/L 3.5 3.5 3.6  Chloride 98 - 111 mmol/L 107 108 107  CO2 22 - 32 mmol/L 24 25 26   Calcium 8.9 - 10.3 mg/dL 8.9 8.3(L) 8.2(L)  Total Protein 6.5 - 8.1 g/dL 7.1 - -  Total Bilirubin 0.3 - 1.2 mg/dL 0.5 - -  Alkaline Phos 38 - 126 U/L 68 - -  AST 15 - 41 U/L 11(L) - -  ALT 0 - 44 U/L 6 - -       RADIOGRAPHIC STUDIES: I have personally reviewed the radiological images as listed and agreed with the findings in the report. No images are attached to the encounter. CT Chest W Contrast  Result Date: 03/06/2021 CLINICAL DATA:  87 year old female with history of breast cancer. Staging examination. New onset of left arm lymphedema. EXAM: CT CHEST WITH CONTRAST TECHNIQUE: Multidetector CT imaging of the chest was performed during intravenous contrast administration. CONTRAST:  24mL OMNIPAQUE IOHEXOL 350 MG/ML SOLN COMPARISON:  Chest CT 02/09/2018. FINDINGS: Cardiovascular: In segmental and subsegmental sized branches of the pulmonary arterial tree in the right upper lobe  there are nonocclusive filling defects, compatible with acute pulmonary embolism. Heart size is normal. There is no significant pericardial fluid, thickening or pericardial calcification. There is aortic atherosclerosis, as well as atherosclerosis of the great vessels of the mediastinum and the  coronary arteries, including calcified atherosclerotic plaque in the left main, left anterior descending, left circumflex and right coronary arteries. Mild calcifications of the aortic valve. Severe calcifications of the mitral annulus. Mediastinum/Nodes: Multiple prominent borderline enlarged and mildly enlarged mediastinal and right hilar lymph nodes are noted, largest of which is in the subcarinal nodal station measuring 2.1 cm in short axis (axial image 67 of series 2). Small hiatal hernia. Numerous enlarged left axillary and subpectoral lymph nodes, largest of which is a conglomerate nodal mass measuring 4.5 x 1.9 cm (axial image 41 of series 2) deep in the left axilla. There is also a partially calcified left subpectoral nodal mass measuring 4.0 x 2.0 cm, increased from 2.9 x 2.1 cm on the prior study). Lungs/Pleura: There is some mild subpleural reticulation in the anterior aspect of the left upper lobe deep to the left breast, likely mild postradiation fibrosis. No definite suspicious appearing pulmonary nodules or masses are noted. No acute consolidative airspace disease. No pleural effusions. Upper Abdomen: Aortic atherosclerosis. Musculoskeletal: Status post bilateral modified radical mastectomy. Several old healed nondisplaced anterolateral left-sided rib fractures are incidentally noted. There are no aggressive appearing lytic or blastic lesions noted in the visualized portions of the skeleton. IMPRESSION: 1. Left axillary and subpectoral lymphadenopathy highly concerning for metastatic disease in this patient with history of left-sided breast cancer. This likely accounts for the new onset of left upper extremity lymphedema. 2. Unexpected nonocclusive segmental and subsegmental sized pulmonary embolism in the right upper lobe. 3. Dilatation of the pulmonic trunk, concerning for pulmonary arterial hypertension. 4. Aortic atherosclerosis, in addition to left main and 3 vessel coronary artery disease. 5.  There are calcifications of the aortic valve (mild) and the mitral annulus (severe). Echocardiographic correlation for evaluation of potential valvular dysfunction may be warranted if clinically indicated. Aortic Atherosclerosis (ICD10-I70.0). Electronically Signed   By: Vinnie Langton M.D.   On: 03/06/2021 07:15     ASSESSMENT & PLAN: Patient is a 88 y.o. female with history of malignant neoplasm of lower-inner quadrant of left breast estrogen receptor positive, chest wall recurrence of left breast cancer, right breast cancer followed by oncologist Dr. Jana Hakim.  #) Pulmonary embolism- CTA yesterday shows unexpected nonocclusive segmental and subsegmental size pulmonary embolism in right upper lobe.  Patient informed of findings.  She is asymptomatic.  Vital signs are stable here.  I viewed labs from 02/26/2021 which showed no anemia, normal platelets.  Patient will be started on Eliquis.  She has history of diverticular bleed in 2021, discussed risk versus benefit of starting blood thinner and she is agreeable with plan.  Discussed risks of taking blood thinner. Patient denies history of falls. We discussed strict ED precautions.   #) Left axillary and subpectoral lymphadenopathy- CTA concerning for metastatic disease.  Patient will need to undergo ultrasound and biopsy for further evaluation of this.  Order has been placed and breath center should reach out for scheduling.  Patient knows to call in 1 week if she has not heard anything. She has an appointment scheduled with Dr. Jana Hakim already for 12/20 that she can keep for follow up as biopsy results should be available by that time to discuss treatment options.  Shared visit with oncologist Dr. Jana Hakim. He agrees with  plan of care.   Visit Diagnosis: 1. Malignant neoplasm of lower-inner quadrant of left breast in female, estrogen receptor positive ([place])   2. Chest wall recurrence of left breast cancer ([place])   3. Single subsegmental pulmonary  embolism without acute cor pulmonale (HCC)   4. Lymphadenopathy      Orders Placed This Encounter  Procedures   [place] LT BREAST BX W LOC DEV 1ST LESION IMG BX SPEC US GUIDE    Standing Status:   Future    Standing Expiration Date:   03/06/2022    Order Specific Question:   Reason for Exam (SYMPTOM  OR DIAGNOSIS REQUIRED)    Answer:   lymphaendopathy, concern for breast cancer reurrence    Order Specific Question:   Preferred location?    Answer[facility]   [facility]   US BREAST LTD UNI LEFT INC AXILLA    Standing Status:   Future    Standing Expiration Date:   03/06/2022    Order Specific Question:   Reason for Exam (SYMPTOM  OR DIAGNOSIS REQUIRED)    Answer:   lymphadenopathy, concern for breast cancer recurrence    Order Specific Question:   Preferred imaging location?    Answer:   [facility]    All questions were answered. The patient knows to call the clinic with any problems, questions or concerns. No barriers to learning was detected.  I have spent a total of 20 minutes minutes of face-to-face and non-face-to-face time, preparing to see the patient, obtaining and/or reviewing separately obtained history, performing a medically appropriate examination, counseling and educating the patient, ordering tests,  documenting clinical information in the electronic health record, and care coordination.     Thank you for allowing me to participate in the care of this patient.    Chauncey Cruel, MD Department of Hematology/Oncology [facility] at [facility] Phone: [phone]  [phone]    03/06/2021 5:42 PM   ADDENDUM: Vaccine looks like she has a third recurrence of her cancer.  We do need to biopsy this to confirm that it is still estrogen receptor positive and if so she will be started on Faslodex.  She already has an appointment with me in December and I have started her to start the Faslodex that day assuming that we will have the biopsy  report by then.  She also has a clot as discussed above. Nery understands that she will be at risk of bleeding from the anticoagulant but that it is necessary in her case.  We are going to start at standard doses but after a month we will drop her t to a lower dose given her age  I personally saw this patient and performed a substantive portion of this encounter with the listed APP documented above.   Chauncey Cruel, MD Medical Oncology and Hematology [facility] [address] [place], [place] [address] Tel. [phone]    Fax. [phone]

## 2021-03-06 NOTE — Patient Instructions (Addendum)
Prescription sent to [facility] outpatient pharmacy for Apixaban (Eliquis). This is a blood thinner we are starting because of the blood clot found in your right lung.   If you fall, notice blood in your stool, or have uncontrollable bleeding you need to call 911 or go to nearest emergency room for evaluation.   Do not drink alcohol or take any NSAID medications while on the blood thinner. Tylenol is safe to take.  Ultrasound and biopsy orders have been placed. Someone will reach out to you to set up scheduling for them.

## 2021-03-06 NOTE — Telephone Encounter (Signed)
Per review of CT report done today - this RN called pt to evaluate status per " incidental PE ".  Izzy states she is having no SOB- but is agreeable to come in for evaluation by [facility] provider.  Appt made per above.

## 2021-03-08 ENCOUNTER — Telehealth: Payer: Self-pay | Admitting: Oncology

## 2021-03-08 NOTE — Telephone Encounter (Signed)
Scheduled appointment per 11/23 scheduling message. Patient is aware.

## 2021-03-18 ENCOUNTER — Ambulatory Visit: Payer: Medicare Other | Admitting: Rehabilitation

## 2021-03-19 ENCOUNTER — Ambulatory Visit (HOSPITAL_COMMUNITY)
Admission: RE | Admit: 2021-03-19 | Discharge: 2021-03-19 | Disposition: A | Discharge status: Home / Self Care | Payer: Medicare Other | Source: Ambulatory Visit | Attending: Physician Assistant | Admitting: Physician Assistant

## 2021-03-19 ENCOUNTER — Other Ambulatory Visit: Payer: Self-pay

## 2021-03-19 ENCOUNTER — Telehealth: Payer: Self-pay

## 2021-03-19 ENCOUNTER — Encounter: Payer: Self-pay | Admitting: Oncology

## 2021-03-19 ENCOUNTER — Inpatient Hospital Stay: Payer: Medicare Other | Attending: Adult Health | Admitting: Physician Assistant

## 2021-03-19 ENCOUNTER — Other Ambulatory Visit (HOSPITAL_COMMUNITY): Payer: Self-pay

## 2021-03-19 VITALS — BP 160/75 | HR 99 | Temp 98.9°F | Resp 18

## 2021-03-19 DIAGNOSIS — I2693 Single subsegmental pulmonary embolism without acute cor pulmonale: Secondary | ICD-10-CM | POA: Diagnosis present

## 2021-03-19 DIAGNOSIS — R059 Cough, unspecified: Secondary | ICD-10-CM

## 2021-03-19 DIAGNOSIS — M8580 Other specified disorders of bone density and structure, unspecified site: Secondary | ICD-10-CM | POA: Insufficient documentation

## 2021-03-19 DIAGNOSIS — Z170 Estrogen receptor positive status [ER+]: Secondary | ICD-10-CM

## 2021-03-19 DIAGNOSIS — R590 Localized enlarged lymph nodes: Secondary | ICD-10-CM | POA: Insufficient documentation

## 2021-03-19 DIAGNOSIS — C50312 Malignant neoplasm of lower-inner quadrant of left female breast: Secondary | ICD-10-CM

## 2021-03-19 DIAGNOSIS — Z853 Personal history of malignant neoplasm of breast: Secondary | ICD-10-CM | POA: Insufficient documentation

## 2021-03-19 MED ORDER — RIVAROXABAN (XARELTO) VTE STARTER PACK (15 & 20 MG)
ORAL_TABLET | ORAL | 0 refills | Status: DC
  Filled 2021-03-19: qty 51, 28d supply, fill #0

## 2021-03-19 MED ORDER — BENZONATATE 100 MG PO CAPS
100.0000 mg | ORAL_CAPSULE | Freq: Three times a day (TID) | ORAL | 0 refills | Status: DC | PRN
Start: 2021-03-19 — End: 2021-04-30
  Filled 2021-03-19: qty 20, 7d supply, fill #0

## 2021-03-19 NOTE — Progress Notes (Signed)
Symptom Management Consult note [place]    Patient Care Team: [name], MD as PCP - General (Family Medicine) Magrinat, Virgie Dad, MD as Consulting Physician (Oncology) Gentry Fitz, MD as Consulting Physician (Family Medicine)    Name of the patient: Tami Schmidt  [id number]  01-09-1933   Date of visit: 03/19/2021    Chief complaint/ Reason for visit- cough  Oncology History   No history exists.    Current Therapy: none. Finished Tamoxifen 4-5 years ago per patient  Interval history- Tami Schmidt is an 87 yo female with history significant for breast cancer, recent diagnosis of PE, and diabetes presenting to [facility] today with chief complaint of cough x 13 days. Patient states cough started x 2 days after she started taking Eliquis. Cough has been persistent, is worse at night and keeping her awake which she reports as frustrating to not get a good night's sleep. She states sometimes cough is productive with green phlegm, not always though. Denies hemoptysis. She states she has tried taking OTC cough medicine and tylenol for symptoms, thinks tylenol seems to help calm down the cough the most. She took a home covid test that was negative. No known sick contacts. She states she had similar cough 20 years ago when she was first prescribed Enalapril. Medication was changed to amlodipine and cough resolved. She denies any fever, congestion, shortness of breath, chest pain, leg swelling, blood in stool, abdominal pain, nausea, vomiting, diarrhea.    ROS  All other systems are reviewed and are negative for acute change except as noted in the HPI.    Allergies  Allergen Reactions   Bactrim Swelling    SWELLING OF MOUTH/FACE.   Lisinopril Swelling    SWELLING OF MOUTH/FACE.   Vasotec Swelling    SWELLING OF MOUTH/FACE.   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   Diabetes mellitus    fasting 90-100   HTN (hypertension) 03/25/2013   Hx of radiation therapy    breasts hx   Hypercholesterolemia    Hypertension    Lymphedema of arm    left arm   Type II or unspecified type diabetes mellitus without mention of complication, not stated as uncontrolled 03/25/2013     Past Surgical History:  Procedure Laterality Date   ABDOMINAL HYSTERECTOMY     ANKLE SURGERY Right 1995   APPENDECTOMY     BIOPSY  01/07/2018   Procedure: BIOPSY;  Surgeon: [name], MD;  Location: [facility];  Service: Gastroenterology;;   BREAST SURGERY Bilateral    mastectomy   COLONOSCOPY WITH PROPOFOL N/A 01/07/2018   Procedure: COLONOSCOPY WITH PROPOFOL;  Surgeon: [name], MD;  Location: [facility];  Service: Gastroenterology;  Laterality: N/A;   ESOPHAGOGASTRODUODENOSCOPY (EGD) WITH PROPOFOL N/A 01/06/2018   Procedure: ESOPHAGOGASTRODUODENOSCOPY (EGD) WITH PROPOFOL;  Surgeon: [name], MD;  Location: [facility];  Service: Gastroenterology;  Laterality: N/A;   HERNIA REPAIR  04-26-2010   MASS EXCISION Left 01/26/2013   Procedure: EXCISION LEFT CHEST WALL MASS AND LEFT ABDOMNAL WALL MASS;  Surgeon: Harl Bowie, MD;  Location: [place];  Service: General;  Laterality: Left;   MASS EXCISION Left 09/20/2014   Procedure: EXCISION OF LEFT CHEST  WALL MASS;  Surgeon: Coralie Keens, MD;  Location: [place];  Service: General;  Laterality: Left;   TEE WITHOUT CARDIOVERSION N/A 12/08/2017   Procedure: TRANSESOPHAGEAL ECHOCARDIOGRAM (TEE);  Surgeon: Lelon Perla, MD;  Location: [place];  Service: Cardiovascular;  Laterality: N/A;   TOTAL HIP ARTHROPLASTY Right 01/05/2017   TOTAL HIP ARTHROPLASTY Right 01/05/2017   Procedure: TOTAL HIP ARTHROPLASTY ANTERIOR APPROACH;  Surgeon: Frederik Pear, MD;  Location: [place];   Service: Orthopedics;  Laterality: Right;    Social History   Socioeconomic History   Marital status: Married    Spouse name: Not on file   Number of children: Not on file   Years of education: Not on file   Highest education level: Not on file  Occupational History   Occupation: retired Education officer, [employer]  Tobacco Use   Smoking status: Former    Types: Cigarettes    Quit date: 04/14/1972    Years since quitting: 48.9   Smokeless tobacco: Never  Vaping Use   Vaping Use: Never used  Substance and Sexual Activity   Alcohol use: Yes    Comment: occasional   Drug use: No   Sexual activity: Yes    Birth control/protection: Surgical  Other Topics Concern   Not on file  Social History Narrative   Not on file   Social Determinants of Health   Financial Resource Strain: Not on file  Food Insecurity: Not on file  Transportation Needs: Not on file  Physical Activity: Not on file  Stress: Not on file  Social Connections: Not on file  Intimate Partner Violence: Not on file    No family history on file.   Current Outpatient Medications:    benzonatate (TESSALON) 100 MG capsule, Take 1 capsule (100 mg total) by mouth 3 (three) times daily as needed for cough., Disp: 20 capsule, Rfl: 0   RIVAROXABAN (XARELTO) VTE STARTER PACK (15 & 20 MG), Follow package directions: Take one 15mg  tablet by mouth twice a day. On day 22, switch to one 20mg  tablet once a day. Take with food., Disp: 51 each, Rfl: 0   amLODipine (NORVASC) 10 MG tablet, Take 10 mg by mouth daily with breakfast. , Disp: , Rfl:    diclofenac Sodium (VOLTAREN) 1 % GEL, Apply 2 g topically daily as needed (For topical arthritis.)., Disp: , Rfl:    HUMULIN N 100 UNIT/ML injection, Inject 25-30 Units into the skin daily. , Disp: , Rfl:    metFORMIN (GLUCOPHAGE) 500 MG tablet, Take 500 mg by mouth daily with breakfast., Disp: , Rfl:    olmesartan-hydrochlorothiazide (BENICAR HCT) 40-12.5 MG tablet, Take 1 tablet by mouth daily.,  Disp: , Rfl:    SM IRON 325 (65 Fe) MG tablet, Take 325 mg by mouth daily., Disp: , Rfl:    TYLENOL 325 MG tablet, Take 325 mg by mouth daily., Disp: , Rfl:   PHYSICAL EXAM: ECOG FS:1 - Symptomatic but completely ambulatory    Vitals:   03/19/21 1343  BP: (!) 160/75  Pulse: 99  Resp: 18  Temp: 98.9 F (37.2 C)  TempSrc: Oral  SpO2: 99%   Physical Exam Vitals and nursing note reviewed.  Constitutional:      Appearance: She is well-developed. She is not ill-appearing or toxic-appearing.  HENT:     Head: Normocephalic and atraumatic.     Nose: Nose normal.  Eyes:     General: No scleral icterus.       Right eye: No  discharge.        Left eye: No discharge.     Conjunctiva/sclera: Conjunctivae normal.  Neck:     Vascular: No JVD.  Cardiovascular:     Rate and Rhythm: Normal rate and regular rhythm.     Pulses: Normal pulses.     Heart sounds: Normal heart sounds.  Pulmonary:     Effort: Pulmonary effort is normal.     Breath sounds: Normal breath sounds.     Comments: Lung exam clear in all fields after patient clears throat with nonproductive cough.  Abdominal:     General: There is no distension.  Musculoskeletal:        General: Normal range of motion.     Cervical back: Normal range of motion.     Right lower leg: No edema.     Left lower leg: No edema.  Skin:    General: Skin is warm and dry.     Capillary Refill: Capillary refill takes less than 2 seconds.     Findings: No rash.  Neurological:     Mental Status: She is oriented to person, place, and time.     GCS: GCS eye subscore is 4. GCS verbal subscore is 5. GCS motor subscore is 6.     Comments: Fluent speech, no facial droop.  Psychiatric:        Behavior: Behavior normal.       LABORATORY DATA: I have reviewed the data as listed CBC Latest Ref Rng & Units 02/26/2021 07/16/2019 07/15/2019  WBC 4.0 - 10.5 K/uL 6.2 7.4 9.7  Hemoglobin 12.0 - 15.0 g/dL 12.4 9.5(L) 10.8(L)  Hematocrit 36.0 - 46.0 %  38.4 29.2(L) 32.7(L)  Platelets 150 - 400 K/uL 231 180 188     CMP Latest Ref Rng & Units 02/26/2021 07/14/2019 07/13/2019  Glucose 70 - 99 mg/dL 113(H) 107(H) 116(H)  BUN 8 - 23 mg/dL 20 10 13   Creatinine 0.44 - 1.00 mg/dL 0.78 0.65 0.60  Sodium 135 - 145 mmol/L 142 142 140  Potassium 3.5 - 5.1 mmol/L 3.5 3.5 3.6  Chloride 98 - 111 mmol/L 107 108 107  CO2 22 - 32 mmol/L 24 25 26   Calcium 8.9 - 10.3 mg/dL 8.9 8.3(L) 8.2(L)  Total Protein 6.5 - 8.1 g/dL 7.1 - -  Total Bilirubin 0.3 - 1.2 mg/dL 0.5 - -  Alkaline Phos 38 - 126 U/L 68 - -  AST 15 - 41 U/L 11(L) - -  ALT 0 - 44 U/L 6 - -       RADIOGRAPHIC STUDIES: I have personally reviewed the radiological images as listed and agreed with the findings in the report. No images are attached to the encounter. DG Chest 2 View  Result Date: 03/19/2021 CLINICAL DATA:  Cough for the past 3 days. EXAM: CHEST - 2 VIEW COMPARISON:  CT chest dated March 05, 2021. FINDINGS: The heart size and mediastinal contours are within normal limits. Normal pulmonary vascularity. No focal consolidation, pleural effusion, or pneumothorax. No acute osseous abnormality. IMPRESSION: No active cardiopulmonary disease. Electronically Signed   By: Titus Dubin M.D.   On: 03/19/2021 13:49   CT Chest W Contrast  Result Date: 03/06/2021 CLINICAL DATA:  87 year old female with history of breast cancer. Staging examination. New onset of left arm lymphedema. EXAM: CT CHEST WITH CONTRAST TECHNIQUE: Multidetector CT imaging of the chest was performed during intravenous contrast administration. CONTRAST:  78mL OMNIPAQUE IOHEXOL 350 MG/ML SOLN COMPARISON:  Chest CT 02/09/2018. FINDINGS: Cardiovascular: In  segmental and subsegmental sized branches of the pulmonary arterial tree in the right upper lobe there are nonocclusive filling defects, compatible with acute pulmonary embolism. Heart size is normal. There is no significant pericardial fluid, thickening or pericardial  calcification. There is aortic atherosclerosis, as well as atherosclerosis of the great vessels of the mediastinum and the coronary arteries, including calcified atherosclerotic plaque in the left main, left anterior descending, left circumflex and right coronary arteries. Mild calcifications of the aortic valve. Severe calcifications of the mitral annulus. Mediastinum/Nodes: Multiple prominent borderline enlarged and mildly enlarged mediastinal and right hilar lymph nodes are noted, largest of which is in the subcarinal nodal station measuring 2.1 cm in short axis (axial image 67 of series 2). Small hiatal hernia. Numerous enlarged left axillary and subpectoral lymph nodes, largest of which is a conglomerate nodal mass measuring 4.5 x 1.9 cm (axial image 41 of series 2) deep in the left axilla. There is also a partially calcified left subpectoral nodal mass measuring 4.0 x 2.0 cm, increased from 2.9 x 2.1 cm on the prior study). Lungs/Pleura: There is some mild subpleural reticulation in the anterior aspect of the left upper lobe deep to the left breast, likely mild postradiation fibrosis. No definite suspicious appearing pulmonary nodules or masses are noted. No acute consolidative airspace disease. No pleural effusions. Upper Abdomen: Aortic atherosclerosis. Musculoskeletal: Status post bilateral modified radical mastectomy. Several old healed nondisplaced anterolateral left-sided rib fractures are incidentally noted. There are no aggressive appearing lytic or blastic lesions noted in the visualized portions of the skeleton. IMPRESSION: 1. Left axillary and subpectoral lymphadenopathy highly concerning for metastatic disease in this patient with history of left-sided breast cancer. This likely accounts for the new onset of left upper extremity lymphedema. 2. Unexpected nonocclusive segmental and subsegmental sized pulmonary embolism in the right upper lobe. 3. Dilatation of the pulmonic trunk, concerning for  pulmonary arterial hypertension. 4. Aortic atherosclerosis, in addition to left main and 3 vessel coronary artery disease. 5. There are calcifications of the aortic valve (mild) and the mitral annulus (severe). Echocardiographic correlation for evaluation of potential valvular dysfunction may be warranted if clinically indicated. Aortic Atherosclerosis (ICD10-I70.0). Electronically Signed   By: Vinnie Langton M.D.   On: 03/06/2021 07:15     ASSESSMENT & PLAN: Patient is a 87 y.o. 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 Jana Hakim.     #) Cough-  Patient afebrile, non toxic appearing. Lungs are clear on exam after coughing. Normal work of breathing. Chest xray performed prior to clinic is without acute infectious processes. I viewed image and agree with radiologist impression. Patient admits to similar cough in the past (20 years ago) when she was taking Enalapril. Discussed patient with oncologist Dr. Jana Hakim who agrees with plan to switch to Xarelto. Patient prescribed starter pack as as she had only been on Eliquis x 9 days. Also prescribed tessalon perles to help with symptom management.  #)Breast cancer- patient has upcoming [place] scheduled 12/16. Biopsy ordered as well for evaluation of possible recurrence. She plan to follow up with Dr. Jana Hakim after at next scheduled visit.    Visit Diagnosis: 1. Single subsegmental pulmonary embolism without acute cor pulmonale (HCC)   2. Cough, unspecified type   3. Malignant neoplasm of lower-inner quadrant of left breast in female, estrogen receptor positive ([place])      No orders of the defined types  were placed in this encounter.   All questions were answered. The patient knows to call the clinic with any problems, questions or concerns. No barriers to learning was detected.  I have spent a total of 10  minutes minutes of face-to-face and non-face-to-face time, preparing to see the patient, obtaining and/or reviewing separately obtained history, performing a medically appropriate examination, counseling and educating the patient, ordering tests,  documenting clinical information in the electronic health record, and care coordination.     Thank you for allowing me to participate in the care of this patient.    Barrie Folk, PA-C Department of Hematology/Oncology [facility] Phone: [phone]  [phone]    03/19/2021 3:18 PM

## 2021-03-19 NOTE — Telephone Encounter (Signed)
Returned phone call to pt.  Pt states she has had a persistent cough since starting her Eliquis.  Pt states that she thought if the cough was associated with a cold that it would have gone away by now.  I scheduled pt to see symptom management today as they saw her previously for the eliquis.  I instructed pt to go have a chest xray at [facility] (per Lisabeth Devoid, [place]) and then come to the cancer center to see [place] at 2pm for further evaluation.  Pt verbalized understanding and thanks.

## 2021-03-19 NOTE — Patient Instructions (Signed)
Stop taking the Eliquis.  New prescription for Xarelto sent to your pharmacy. This is also a blood thinner, take as prescribed.  Prescription for tessalon perles also sent to your pharmacy. This might help with your cough. Take as prescribed if needed.

## 2021-03-29 ENCOUNTER — Encounter: Payer: Self-pay | Admitting: Oncology

## 2021-03-29 ENCOUNTER — Ambulatory Visit
Admission: RE | Admit: 2021-03-29 | Discharge: 2021-03-29 | Disposition: A | Discharge status: Home / Self Care | Payer: Medicare Other | Source: Ambulatory Visit | Attending: Adult Health | Admitting: Adult Health

## 2021-03-29 ENCOUNTER — Other Ambulatory Visit: Payer: Self-pay | Admitting: Physician Assistant

## 2021-03-29 DIAGNOSIS — C50912 Malignant neoplasm of unspecified site of left female breast: Secondary | ICD-10-CM

## 2021-03-29 DIAGNOSIS — R591 Generalized enlarged lymph nodes: Secondary | ICD-10-CM

## 2021-03-29 DIAGNOSIS — C50312 Malignant neoplasm of lower-inner quadrant of left female breast: Secondary | ICD-10-CM

## 2021-03-29 DIAGNOSIS — Z170 Estrogen receptor positive status [ER+]: Secondary | ICD-10-CM

## 2021-04-01 NOTE — Progress Notes (Signed)
ID: [name] OB: 1932-11-13  MR#: [id number]  CSN#:[id number]  Patient Care Team: [name], MD as PCP - General (Family Medicine) Kratos Ruscitti, Virgie Dad, MD as Consulting Physician (Oncology) Gentry Fitz, MD as Consulting Physician (Family Medicine) OTHER MD:   CHIEF COMPLAINT:  bilateral breast cancersadenopathy  CURRENT TREATMENT: Rivaroxaban; work-up in progress   INTERVAL HISTORY: Tami Schmidt returns today for follow up of her history of breast cancer and new left arm lymphedema.    CT of the chest with contrast 03/06/2021 showed nonocclusive segmental and subsegmental pulmonary emboli in the right upper lobe.  She was started on rivaroxaban and is tolerating that well so far, with no bleeding or bruising that she is aware of.  The chest CT also showed left axillary and subpectoral lymphadenopathy, explaining the new onset of left upper extremity lymphedema.  This was followed by left breast and axilla ultrasound on 03/29/2021 showing: two stable masses in infraclavicular subcutaneous tissues most consistent with fat necrosis and stable; a third lesion has similar characteristics also in infraclavicular subcutaneous region, possibly representing an additional focus of fat necrosis; however there are multiple retropectoral, interpectoral and left axillary masses suspicious for metastatic disease.  She is scheduled for biopsy of the axillary mass(es) on 04/04/2021.   REVIEW OF SYSTEMS: Alithea tells me since starting the rivaroxaban the swelling has gone down a little.  Likely the reason is that she is wearing a sleeve.  She does need her husband's help to obtain it.  She is going to complete the initial twice daily.  Of rivaroxaban and already has the 20 mg daily tablets on hand which will start in 2 or 3 days she tells me.  She had a bad cough for a while, not related to COVID but that is better.  Otherwise a detailed review of systems today was stable.   COVID 19  VACCINATION STATUS:    BREAST CANCER HISTORY: From the earlier summary:  Josette's history of breast cancer dates back to 1993, when she underwent biopsy of her right breast in [place]. I do not have those details, but she moved to [place] shortly thereafter and it was in [place] where she had her right lumpectomy and axillary lymph node dissection. She had a total of 6 lymph nodes removed, she tells me, and all of them were clear. She does not recall the size of the primary. She was treated with adjuvant radiation and then received tamoxifen between 1993 and 1998.  In 2002 she had a local recurrence in the right breast, and underwent right mastectomy. She received tamoxifen between 2002 and 2007.  In 2004 she underwent a left mastectomy and sentinel lymph node sampling for a 9 mm mucinous carcinoma, grade 2, multifocal, associated with ductal carcinoma in situ. The single sentinel lymph node was negative. Margins were ample. She did not receive adjuvant radiation, but continued on tamoxifen as noted above, until 2007.  More recently, in July of 2014, the patient noted a "pimple" in the left chest wall, superior to her prior mastectomy incision. She brought this to the attention of her surgeon, Dr. Ninfa Linden and he describes a dimple area with a firm mass underneath. He also describes a large lipoma in the left clavicular area and a second one on her abdominal wall. Both of these were symptomatic. All 3 masses were removed 01/26/2013. The pathology from this procedure ([id number]) showed, in the chest wall, and invasive ductal carcinoma measuring grossly 1.3 cm. The lesion abutted the  closest inked resection margin. The other 2 masses proved to be lipomas.  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  ([facility] [employer]) 03/25/2013   Diabetes mellitus    fasting 90-100   HTN (hypertension) 03/25/2013   Hx of radiation therapy    breasts hx   Hypercholesterolemia    Hypertension    Lymphedema of arm    left arm   Type II or unspecified type diabetes mellitus without mention of complication, not stated as uncontrolled 03/25/2013    PAST SURGICAL HISTORY: Past Surgical History:  Procedure Laterality Date   ABDOMINAL HYSTERECTOMY     ANKLE SURGERY Right 1995   APPENDECTOMY     BIOPSY  01/07/2018   Procedure: BIOPSY;  Surgeon: [name], MD;  Location: [facility];  Service: Gastroenterology;;   BREAST SURGERY Bilateral    mastectomy   COLONOSCOPY WITH PROPOFOL N/A 01/07/2018   Procedure: COLONOSCOPY WITH PROPOFOL;  Surgeon: [name], MD;  Location: [facility];  Service: Gastroenterology;  Laterality: N/A;   ESOPHAGOGASTRODUODENOSCOPY (EGD) WITH PROPOFOL N/A 01/06/2018   Procedure: ESOPHAGOGASTRODUODENOSCOPY (EGD) WITH PROPOFOL;  Surgeon: [name], MD;  Location: [facility];  Service: Gastroenterology;  Laterality: N/A;   HERNIA REPAIR  04-26-2010   MASS EXCISION Left 01/26/2013   Procedure: EXCISION LEFT CHEST WALL MASS AND LEFT ABDOMNAL WALL MASS;  Surgeon: Harl Bowie, MD;  Location: [facility];  Service: General;  Laterality: Left;   MASS EXCISION Left 09/20/2014   Procedure: EXCISION OF LEFT CHEST WALL MASS;  Surgeon: Coralie Keens, MD;  Location: [facility];  Service: General;  Laterality: Left;   TEE WITHOUT CARDIOVERSION N/A 12/08/2017   Procedure: TRANSESOPHAGEAL ECHOCARDIOGRAM (TEE);  Surgeon: Lelon Perla, MD;  Location: [facility];  Service: Cardiovascular;  Laterality: N/A;   TOTAL HIP ARTHROPLASTY Right 01/05/2017   TOTAL HIP ARTHROPLASTY Right 01/05/2017   Procedure: TOTAL HIP ARTHROPLASTY ANTERIOR APPROACH;  Surgeon: Frederik Pear, MD;  Location: [facility];  Service: Orthopedics;  Laterality: Right;    FAMILY HISTORY The patient's father died from a  heart attack at the age of 36. The patient's mother died at the age of 37 from myocarditis. The patient had no brothers, one sister. There is no history of breast or ovarian cancer in the family to her knowledge   GYNECOLOGIC HISTORY:   (Reviewed 09/22/2013) Menarche age 68, first live birth age 22, the patient is [place] P5. She underwent hysterectomy in 1984. She did not use hormone replacement.   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 Her husband Radiographer, therapeutic used to work in Radio [employer] but is now retired. Their children are: Reed Breech who lives in [place] and teaches theater there; Julius Bowels who works as a Social worker in [place]; Archivist who lives in [place] and is a "radio personality"; Elta Guadeloupe, who is a Biomedical scientist in [place], and Legrand Como who is a musician living in [place]. The patient has 2 grandchildren. She attends a [employer]    ADVANCED DIRECTIVES: Not in place   HEALTH MAINTENANCE:  Social History   Tobacco Use   Smoking status: Former    Types: Cigarettes    Quit date: 04/14/1972    Years since quitting: 49.0   Smokeless tobacco:  Never  Vaping Use   Vaping Use: Never used  Substance Use Topics   Alcohol use: Yes    Comment: occasional   Drug use: No    Colonoscopy: 2011  PAP: Status post hysterectomy  Bone density: 1993 ; repeat 12/27/2013 showed osteopenia with a T score of -1.8  Lipid panel: not on file  Allergies  Allergen Reactions   Bactrim Swelling    SWELLING OF MOUTH/FACE.   Lisinopril Swelling    SWELLING OF MOUTH/FACE.   Vasotec Swelling    SWELLING OF MOUTH/FACE.   Sulfamethoxazole-Trimethoprim     Other reaction(s): Unknown    Current Outpatient Medications  Medication Sig Dispense Refill   amLODipine (NORVASC) 10 MG tablet Take 10 mg by mouth daily with breakfast.      benzonatate (TESSALON) 100 MG capsule Take 1 capsule (100 mg total) by mouth 3 (three) times daily as needed for cough. 20 capsule 0    diclofenac Sodium (VOLTAREN) 1 % GEL Apply 2 g topically daily as needed (For topical arthritis.).     HUMULIN N 100 UNIT/ML injection Inject 25-30 Units into the skin daily.      metFORMIN (GLUCOPHAGE) 500 MG tablet Take 500 mg by mouth daily with breakfast.     olmesartan-hydrochlorothiazide (BENICAR HCT) 40-12.5 MG tablet Take 1 tablet by mouth daily.     RIVAROXABAN (XARELTO) VTE STARTER PACK (15 & 20 MG) Follow package directions: Take one 8m tablet by mouth twice a day. On day 22, switch to one 214mtablet once a day. Take with food. 51 each 0   SM IRON 325 (65 Fe) MG tablet Take 325 mg by mouth daily.     TYLENOL 325 MG tablet Take 325 mg by mouth daily.     No current facility-administered medications for this visit.    OBJECTIVE: African-American woman who appears stated age  Vi44  04/02/21 0947  BP: (!) 161/74  Pulse: 79  Resp: 18  Temp: (!) 97.3 F (36.3 C)  SpO2: 100%     Body mass index is 33.85 kg/m.    ECOG FS: 2 Filed Weights   04/02/21 0947  Weight: 209 lb 11.2 oz (95.1 kg)   Sclerae unicteric, EOMs intact Wearing a mask She has small palpable 1/3-1 /2 centimeter spots along the left clavicular area, which are unchanged from baseline Lungs no rales or rhonchi Heart regular rate and rhythm Abd soft, obese, nontender, positive bowel sounds MSK no focal spinal tenderness, left upper extremity lymphedema with sleeve in place Neuro: nonfocal, well oriented, appropriate affect Breasts: Status post bilateral mastectomies.  I do not feel palpable adenopathy in the left axilla.   LAB RESULTS:  Lab Results  Component Value Date   WBC 5.4 04/02/2021   NEUTROABS 3.1 04/02/2021   HGB 12.2 04/02/2021   HCT 38.6 04/02/2021   MCV 78.1 (L) 04/02/2021   PLT 293 04/02/2021      Chemistry      Component Value Date/Time   NA 139 04/02/2021 0928   NA 139 01/20/2017 1057   K 4.0 04/02/2021 0928   K 3.9 01/20/2017 1057   CL 105 04/02/2021 0928   CO2 27  04/02/2021 0928   CO2 25 01/20/2017 1057   BUN 16 04/02/2021 0928   BUN 15.6 01/20/2017 1057   CREATININE 0.73 04/02/2021 0928   CREATININE 0.7 01/20/2017 1057      Component Value Date/Time   CALCIUM 9.3 04/02/2021 0928   CALCIUM 9.3 01/20/2017 1057  ALKPHOS 57 04/02/2021 0928   ALKPHOS 69 01/20/2017 1057   AST 10 (L) 04/02/2021 0928   AST 10 01/20/2017 1057   ALT 6 04/02/2021 0928   ALT 14 01/20/2017 1057   BILITOT 0.4 04/02/2021 0928   BILITOT 0.43 01/20/2017 1057      STUDIES: DG Chest 2 View  Result Date: 03/19/2021 CLINICAL DATA:  Cough for the past 3 days. EXAM: CHEST - 2 VIEW COMPARISON:  CT chest dated March 05, 2021. FINDINGS: The heart size and mediastinal contours are within normal limits. Normal pulmonary vascularity. No focal consolidation, pleural effusion, or pneumothorax. No acute osseous abnormality. IMPRESSION: No active cardiopulmonary disease. Electronically Signed   By: Titus Dubin M.D.   On: 03/19/2021 13:49   CT Chest W Contrast  Result Date: 03/06/2021 CLINICAL DATA:  87 year old female with history of breast cancer. Staging examination. New onset of left arm lymphedema. EXAM: CT CHEST WITH CONTRAST TECHNIQUE: Multidetector CT imaging of the chest was performed during intravenous contrast administration. CONTRAST:  8m OMNIPAQUE IOHEXOL 350 MG/ML SOLN COMPARISON:  Chest CT 02/09/2018. FINDINGS: Cardiovascular: In segmental and subsegmental sized branches of the pulmonary arterial tree in the right upper lobe there are nonocclusive filling defects, compatible with acute pulmonary embolism. Heart size is normal. There is no significant pericardial fluid, thickening or pericardial calcification. There is aortic atherosclerosis, as well as atherosclerosis of the great vessels of the mediastinum and the coronary arteries, including calcified atherosclerotic plaque in the left main, left anterior descending, left circumflex and right coronary arteries. Mild  calcifications of the aortic valve. Severe calcifications of the mitral annulus. Mediastinum/Nodes: Multiple prominent borderline enlarged and mildly enlarged mediastinal and right hilar lymph nodes are noted, largest of which is in the subcarinal nodal station measuring 2.1 cm in short axis (axial image 67 of series 2). Small hiatal hernia. Numerous enlarged left axillary and subpectoral lymph nodes, largest of which is a conglomerate nodal mass measuring 4.5 x 1.9 cm (axial image 41 of series 2) deep in the left axilla. There is also a partially calcified left subpectoral nodal mass measuring 4.0 x 2.0 cm, increased from 2.9 x 2.1 cm on the prior study). Lungs/Pleura: There is some mild subpleural reticulation in the anterior aspect of the left upper lobe deep to the left breast, likely mild postradiation fibrosis. No definite suspicious appearing pulmonary nodules or masses are noted. No acute consolidative airspace disease. No pleural effusions. Upper Abdomen: Aortic atherosclerosis. Musculoskeletal: Status post bilateral modified radical mastectomy. Several old healed nondisplaced anterolateral left-sided rib fractures are incidentally noted. There are no aggressive appearing lytic or blastic lesions noted in the visualized portions of the skeleton. IMPRESSION: 1. Left axillary and subpectoral lymphadenopathy highly concerning for metastatic disease in this patient with history of left-sided breast cancer. This likely accounts for the new onset of left upper extremity lymphedema. 2. Unexpected nonocclusive segmental and subsegmental sized pulmonary embolism in the right upper lobe. 3. Dilatation of the pulmonic trunk, concerning for pulmonary arterial hypertension. 4. Aortic atherosclerosis, in addition to left main and 3 vessel coronary artery disease. 5. There are calcifications of the aortic valve (mild) and the mitral annulus (severe). Echocardiographic correlation for evaluation of potential valvular  dysfunction may be warranted if clinically indicated. Aortic Atherosclerosis (ICD10-I70.0). Electronically Signed   By: DVinnie LangtonM.D.   On: 03/06/2021 07:15   UKoreaBREAST LTD UNI LEFT INC AXILLA  Result Date: 03/29/2021 CLINICAL DATA:  Worsening nodularity of chest wall, new onset LEFT upper extremity  lymphedema. History of bilateral mastectomy for LEFT breast cancer in 2004. Patient had recurrence at the LEFT chest wall in 2014. Recent chest CT shows LEFT axillary and subpectoral lymphadenopathy concerning for metastatic disease. Patient also had a benign lipoma removed from the LEFT infraclavicular region and has had subsequent imaging showing fat necrosis in this region. EXAM: ULTRASOUND OF THE LEFT BREAST COMPARISON:  Previous exams, including CT chest on 03/05/2021 and 02/09/2018 previous ultrasound exams 09/06/2014, 01/24/2014 and earlier FINDINGS: On physical exam, I palpate discrete nodule in the superficial LEFT infraclavicular region. There is also irregularity of the deeper tissues in the LEFT chest wall. Targeted ultrasound is performed, showing a circumscribed oval hypoechoic calcified mass in the LEFT breast 4 centimeters from the clavicle measuring 1.3 x 0.9 x 0.8 centimeters and consistent with calcified oil cyst, stable compared to prior ultrasound and CT exams. 3 centimeters below the LEFT clavicle, a smaller oval hypoechoic mass is 0.4 x 0.4 x 0.3 centimeters and stable compared with prior study, consistent with fat necrosis. Superior to these nodules, a third nodule is heterogeneous and contains a central calcification, measuring 1.4 x 0.7 x 0.5 centimeters. This lesion has not been imaged previously, but has similar features, likely fat necrosis. Deeper to these subcutaneous masses, there are multiple masses in the retropectoral region. The largest of these is partially calcified and correlates well with the recent CT exam showing mass to measure 4.0 x 2.0 centimeters and increased in  size from previous 2019 CT exam. Additional subpectoral and intra-pectoral nodules are identified in the anterior chest wall. In the LEFT axilla, a hypoechoic mass is 3.4 x 1.9 x 2.5 centimeters. Just MEDIAL to this mass is a solid mass measuring 1.8 centimeters. Additional lymph nodes with cortical thickening are also present in the retropectoral region. IMPRESSION: 1. Two stable masses in the infraclavicular subcutaneous tissues are most consistent with fat necrosis and appear stable. A third lesion has similar characteristics also in the infraclavicular subcutaneous region, possibly representing an additional focus of fat necrosis. 2. Multiple retropectoral, interpectoral and LEFT axillary masses are suspicious for metastatic disease. RECOMMENDATION: Recommend ultrasound-guided core biopsy of LEFT axillary mass. I have discussed the findings and recommendations with the patient. If applicable, a reminder letter will be sent to the patient regarding the next appointment. BI-RADS CATEGORY  4: Suspicious. Electronically Signed   By: Nolon Nations M.D.   On: 03/29/2021 12:24    ASSESSMENT: 87 y.o. [place] woman  (1) status post right lumpectomy and axillary dissection in 1993 in [place] for what appears to have been a stage I breast cancer, treated with radiation and then tamoxifen for 5 years  (2) status post right mastectomy 2002 for a recurrence in the right breast, treated adjuvantly with tamoxifen for an additional 5 years  (3) status post left mastectomy and sentinel lymph node sampling 03/07/2003 for a lower inner quadrant pT1b, pN0, stage IA mucinous breast cancer, grade 2; no radiation therapy was given. Tamoxifen was continued until 2009  RECURRENT DISEASE/ LEFT: OCTOBER 2014 (4) excisional biopsy of a left chest wall mass 01/26/2013 showing carcinoma consistent with invasive ductal carcinoma, estrogen receptor 100% positive, progesterone receptor 0% positive, with an MIB-1 of 22% and no  HER-2 amplification.   (5)  PET scan on 03/03/2013 confirmed extensive metastatic adenopathy in the left axilla, left subpectoral musculature, and left supraclavicular region  (6)  started on anastrozole daily beginning 03/01/2013, interrupted during radiation therapy, resumed March 2015, discontinued March 2016 because of fatigue  and arthralgias  (7) radiation therapy 03/31/2013 through 05/23/2013 Site/dose:   The patient was treated initially with a forward treatment planning technique to the left chest wall in addition to treatment to the supraclavicular region. This consisted of a 3-D conformal technique. The patient was treated in this fashion to a dose of 50.4 gray. The patient then received a 14 gray boost treatment using an electron field. The total dose was 64.4 gray.  (8) Osteopenia, zometa yearly started 07/27/2014, but poorly tolerated.   (a) bone density scan on 12/27/13 showed a t-score of -1.8   (b) started Denosumab/Prolia April 2017, canceled after one dose per patient  (9) tamoxifen started 07/27/2014, stopped November 2019 (had total 5 years of antiestrogens)  (10) likely thalassemia trait  (11) left subpectoral soft tissue nodule noted 07/17/2015 unchanged through serial scans, most recent 22/48/2500, with no metabolic activity noted in PET scan of 2016 and interval calcification.  This is presumed benign  (12)  ADDITIONAL REGIONAL RECURRENCE:  (A) CT of the chest with contrast 03/05/2021 obtained to evaluate left upper extremity lymphedema as showed acute pulmonary emboli.  There was also left axillary and subpectoral lymphadenopathy  (B) rivaroxaban started 03/05/2021  (C) left breast ultrasound 03/29/2021 shows 2 areas in the infraclavicular tissue consistent with fat necrosis.  There were however multiple retropectoral interpectoral and left axillary masses.  (D) left axillary lymph node biopsy 04/04/2021   PLAN:  Juliene very likely is having a second recurrence of  her left breast cancer.  I had set her up for fulvestrant/Faslodex today thinking we would have pathology and a prognostic panel, but the biopsy will not be done until 04/04/2021 and the prognostic panel likely will not be read for another 10 days or so given the intervening holidays.  Accordingly I have made her another appointment for early January.  At that time we should have the prognostic panel and if positive we can consider starting Faslodex the same day.  Whether or not she should have low-dose abemaciclib or a similar agent added at the same time can be decided then.  She is tolerating rivaroxaban well.  The plan at this point is to continue it indefinitely assuming no complications.  She knows to call for any other issue that may develop before the next visit.  Total encounter time 25 minutes.Sarajane Jews C. Karin Griffith, MD 04/02/21 5:22 PM Medical Oncology and Hematology [facility] [place], [place] [address] Tel. [phone]    Fax. [phone]   I, Wilburn Mylar, am acting as scribe for Dr. Virgie Dad. Khushboo Chuck.  I, Lurline Del MD, have reviewed the above documentation for accuracy and completeness, and I agree with the above.    *Total Encounter Time as defined by the Centers for Medicare and Medicaid Services includes, in addition to the face-to-face time of a patient visit (documented in the note above) non-face-to-face time: obtaining and reviewing outside history, ordering and reviewing medications, tests or procedures, care coordination (communications with other health care professionals or caregivers) and documentation in the medical record.

## 2021-04-02 ENCOUNTER — Other Ambulatory Visit: Payer: Self-pay

## 2021-04-02 ENCOUNTER — Inpatient Hospital Stay: Payer: Medicare Other | Admitting: Oncology

## 2021-04-02 ENCOUNTER — Inpatient Hospital Stay: Payer: Medicare Other

## 2021-04-02 VITALS — BP 161/74 | HR 79 | Temp 97.3°F | Resp 18 | Ht 66.0 in | Wt 209.7 lb

## 2021-04-02 DIAGNOSIS — Z170 Estrogen receptor positive status [ER+]: Secondary | ICD-10-CM

## 2021-04-02 DIAGNOSIS — M8580 Other specified disorders of bone density and structure, unspecified site: Secondary | ICD-10-CM | POA: Diagnosis not present

## 2021-04-02 DIAGNOSIS — C50912 Malignant neoplasm of unspecified site of left female breast: Secondary | ICD-10-CM

## 2021-04-02 DIAGNOSIS — C50919 Malignant neoplasm of unspecified site of unspecified female breast: Secondary | ICD-10-CM

## 2021-04-02 DIAGNOSIS — C50312 Malignant neoplasm of lower-inner quadrant of left female breast: Secondary | ICD-10-CM

## 2021-04-02 DIAGNOSIS — C7989 Secondary malignant neoplasm of other specified sites: Secondary | ICD-10-CM | POA: Diagnosis not present

## 2021-04-02 DIAGNOSIS — R590 Localized enlarged lymph nodes: Secondary | ICD-10-CM | POA: Diagnosis not present

## 2021-04-02 DIAGNOSIS — Z853 Personal history of malignant neoplasm of breast: Secondary | ICD-10-CM | POA: Diagnosis present

## 2021-04-02 LAB — CMP (CANCER CENTER ONLY)
ALT: 6 U/L (ref 0–44)
AST: 10 U/L — ABNORMAL LOW (ref 15–41)
Albumin: 3.8 g/dL (ref 3.5–5.0)
Alkaline Phosphatase: 57 U/L (ref 38–126)
Anion gap: 7 (ref 5–15)
BUN: 16 mg/dL (ref 8–23)
CO2: 27 mmol/L (ref 22–32)
Calcium: 9.3 mg/dL (ref 8.9–10.3)
Chloride: 105 mmol/L (ref 98–111)
Creatinine: 0.73 mg/dL (ref 0.44–1.00)
GFR, Estimated: >60 mL/min (ref >60)
Glucose, Bld: 97 mg/dL (ref 70–99)
Potassium: 4 mmol/L (ref 3.5–5.1)
Sodium: 139 mmol/L (ref 135–145)
Total Bilirubin: 0.4 mg/dL (ref 0.3–1.2)
Total Protein: 7 g/dL (ref 6.5–8.1)

## 2021-04-02 LAB — CBC WITH DIFFERENTIAL (CANCER CENTER ONLY)
Abs Immature Granulocytes: 0.02 10*3/uL (ref 0.00–0.07)
Basophils Absolute: 0.1 10*3/uL (ref 0.0–0.1)
Basophils Relative: 1 %
Eosinophils Absolute: 0.2 10*3/uL (ref 0.0–0.5)
Eosinophils Relative: 4 %
HCT: 38.6 % (ref 36.0–46.0)
Hemoglobin: 12.2 g/dL (ref 12.0–15.0)
Immature Granulocytes: 0 %
Lymphocytes Relative: 31 %
Lymphs Abs: 1.7 10*3/uL (ref 0.7–4.0)
MCH: 24.7 pg — ABNORMAL LOW (ref 26.0–34.0)
MCHC: 31.6 g/dL (ref 30.0–36.0)
MCV: 78.1 fL — ABNORMAL LOW (ref 80.0–100.0)
Monocytes Absolute: 0.4 10*3/uL (ref 0.1–1.0)
Monocytes Relative: 7 %
Neutro Abs: 3.1 10*3/uL (ref 1.7–7.7)
Neutrophils Relative %: 57 %
Platelet Count: 293 10*3/uL (ref 150–400)
RBC: 4.94 MIL/uL (ref 3.87–5.11)
RDW: 15.9 % — ABNORMAL HIGH (ref 11.5–15.5)
WBC Count: 5.4 10*3/uL (ref 4.0–10.5)
nRBC: 0 % (ref 0.0–0.2)

## 2021-04-03 LAB — CANCER ANTIGEN 27.29: CA 27.29: 44.4 U/mL — ABNORMAL HIGH (ref 0.0–38.6)

## 2021-04-04 ENCOUNTER — Ambulatory Visit
Admission: RE | Admit: 2021-04-04 | Discharge: 2021-04-04 | Disposition: A | Discharge status: Home / Self Care | Payer: Medicare Other | Source: Ambulatory Visit | Attending: Physician Assistant | Admitting: Physician Assistant

## 2021-04-04 DIAGNOSIS — C50312 Malignant neoplasm of lower-inner quadrant of left female breast: Secondary | ICD-10-CM

## 2021-04-04 DIAGNOSIS — R591 Generalized enlarged lymph nodes: Secondary | ICD-10-CM

## 2021-04-04 DIAGNOSIS — Z170 Estrogen receptor positive status [ER+]: Secondary | ICD-10-CM

## 2021-04-09 ENCOUNTER — Telehealth: Payer: Self-pay | Admitting: Oncology

## 2021-04-09 NOTE — Telephone Encounter (Signed)
Scheduled appointment per 12/20 los. Left message.

## 2021-04-11 ENCOUNTER — Telehealth: Payer: Self-pay | Admitting: Oncology

## 2021-04-11 NOTE — Telephone Encounter (Signed)
Patient will receive updated calendar.

## 2021-04-18 ENCOUNTER — Other Ambulatory Visit: Payer: Self-pay

## 2021-04-18 ENCOUNTER — Telehealth: Payer: Self-pay | Admitting: Physician Assistant

## 2021-04-18 ENCOUNTER — Encounter: Payer: Self-pay | Admitting: Oncology

## 2021-04-18 ENCOUNTER — Telehealth: Payer: Self-pay | Admitting: Hematology and Oncology

## 2021-04-18 ENCOUNTER — Other Ambulatory Visit (HOSPITAL_COMMUNITY): Payer: Self-pay

## 2021-04-18 ENCOUNTER — Inpatient Hospital Stay: Payer: Medicare Other

## 2021-04-18 ENCOUNTER — Inpatient Hospital Stay: Payer: Medicare Other | Attending: Adult Health

## 2021-04-18 ENCOUNTER — Inpatient Hospital Stay: Payer: Medicare Other | Admitting: Hematology and Oncology

## 2021-04-18 ENCOUNTER — Encounter: Payer: Self-pay | Admitting: Hematology and Oncology

## 2021-04-18 ENCOUNTER — Encounter: Payer: Self-pay | Admitting: *Deleted

## 2021-04-18 ENCOUNTER — Other Ambulatory Visit: Payer: Self-pay | Admitting: *Deleted

## 2021-04-18 VITALS — BP 159/71 | HR 88 | Temp 98.1°F | Resp 18 | Ht 66.0 in | Wt 213.0 lb

## 2021-04-18 DIAGNOSIS — I2693 Single subsegmental pulmonary embolism without acute cor pulmonale: Secondary | ICD-10-CM | POA: Diagnosis not present

## 2021-04-18 DIAGNOSIS — C50312 Malignant neoplasm of lower-inner quadrant of left female breast: Secondary | ICD-10-CM | POA: Diagnosis not present

## 2021-04-18 DIAGNOSIS — R591 Generalized enlarged lymph nodes: Secondary | ICD-10-CM

## 2021-04-18 DIAGNOSIS — Z5111 Encounter for antineoplastic chemotherapy: Secondary | ICD-10-CM | POA: Diagnosis present

## 2021-04-18 DIAGNOSIS — Z170 Estrogen receptor positive status [ER+]: Secondary | ICD-10-CM

## 2021-04-18 DIAGNOSIS — C773 Secondary and unspecified malignant neoplasm of axilla and upper limb lymph nodes: Secondary | ICD-10-CM | POA: Insufficient documentation

## 2021-04-18 DIAGNOSIS — C50919 Malignant neoplasm of unspecified site of unspecified female breast: Secondary | ICD-10-CM

## 2021-04-18 DIAGNOSIS — C50912 Malignant neoplasm of unspecified site of left female breast: Secondary | ICD-10-CM

## 2021-04-18 DIAGNOSIS — C7989 Secondary malignant neoplasm of other specified sites: Secondary | ICD-10-CM

## 2021-04-18 LAB — CBC WITH DIFFERENTIAL (CANCER CENTER ONLY)
Abs Immature Granulocytes: 0.02 10*3/uL (ref 0.00–0.07)
Basophils Absolute: 0.1 10*3/uL (ref 0.0–0.1)
Basophils Relative: 1 %
Eosinophils Absolute: 0.3 10*3/uL (ref 0.0–0.5)
Eosinophils Relative: 5 %
HCT: 38.5 % (ref 36.0–46.0)
Hemoglobin: 12 g/dL (ref 12.0–15.0)
Immature Granulocytes: 0 %
Lymphocytes Relative: 27 %
Lymphs Abs: 1.5 10*3/uL (ref 0.7–4.0)
MCH: 24.9 pg — ABNORMAL LOW (ref 26.0–34.0)
MCHC: 31.2 g/dL (ref 30.0–36.0)
MCV: 80 fL (ref 80.0–100.0)
Monocytes Absolute: 0.3 10*3/uL (ref 0.1–1.0)
Monocytes Relative: 6 %
Neutro Abs: 3.4 10*3/uL (ref 1.7–7.7)
Neutrophils Relative %: 61 %
Platelet Count: 230 10*3/uL (ref 150–400)
RBC: 4.81 MIL/uL (ref 3.87–5.11)
RDW: 16.5 % — ABNORMAL HIGH (ref 11.5–15.5)
WBC Count: 5.6 10*3/uL (ref 4.0–10.5)
nRBC: 0 % (ref 0.0–0.2)

## 2021-04-18 LAB — CMP (CANCER CENTER ONLY)
ALT: 5 U/L (ref 0–44)
AST: 10 U/L — ABNORMAL LOW (ref 15–41)
Albumin: 3.8 g/dL (ref 3.5–5.0)
Alkaline Phosphatase: 59 U/L (ref 38–126)
Anion gap: 8 (ref 5–15)
BUN: 18 mg/dL (ref 8–23)
CO2: 26 mmol/L (ref 22–32)
Calcium: 8.8 mg/dL — ABNORMAL LOW (ref 8.9–10.3)
Chloride: 106 mmol/L (ref 98–111)
Creatinine: 0.74 mg/dL (ref 0.44–1.00)
GFR, Estimated: >60 mL/min (ref >60)
Glucose, Bld: 176 mg/dL — ABNORMAL HIGH (ref 70–99)
Potassium: 4.1 mmol/L (ref 3.5–5.1)
Sodium: 140 mmol/L (ref 135–145)
Total Bilirubin: 0.5 mg/dL (ref 0.3–1.2)
Total Protein: 6.9 g/dL (ref 6.5–8.1)

## 2021-04-18 MED ORDER — FULVESTRANT 250 MG/5ML IM SOSY
500.0000 mg | PREFILLED_SYRINGE | Freq: Once | INTRAMUSCULAR | Status: AC
  Administered 2021-04-18: 500 mg via INTRAMUSCULAR
  Filled 2021-04-18: qty 10

## 2021-04-18 MED ORDER — RIVAROXABAN 20 MG PO TABS
20.0000 mg | ORAL_TABLET | Freq: Every day | ORAL | 3 refills | Status: DC
Start: 2021-04-18 — End: 2021-04-19
  Filled 2021-04-18: qty 30, 30d supply, fill #0

## 2021-04-18 NOTE — Patient Instructions (Signed)
Fulvestrant injection °What is this medication? °FULVESTRANT (ful VES trant) blocks the effects of estrogen. It is used to treat breast cancer. °This medicine may be used for other purposes; ask your health care provider or pharmacist if you have questions. °COMMON BRAND NAME(S): FASLODEX °What should I tell my care team before I take this medication? °They need to know if you have any of these conditions: °bleeding disorders °liver disease °low blood counts, like low white cell, platelet, or red cell counts °an unusual or allergic reaction to fulvestrant, other medicines, foods, dyes, or preservatives °pregnant or trying to get pregnant °breast-feeding °How should I use this medication? °This medicine is for injection into a muscle. It is usually given by a health care professional in a hospital or clinic setting. °Talk to your pediatrician regarding the use of this medicine in children. Special care may be needed. °Overdosage: If you think you have taken too much of this medicine contact a poison control center or emergency room at once. °NOTE: This medicine is only for you. Do not share this medicine with others. °What if I miss a dose? °It is important not to miss your dose. Call your doctor or health care professional if you are unable to keep an appointment. °What may interact with this medication? °medicines that treat or prevent blood clots like warfarin, enoxaparin, dalteparin, apixaban, dabigatran, and rivaroxaban °This list may not describe all possible interactions. Give your health care provider a list of all the medicines, herbs, non-prescription drugs, or dietary supplements you use. Also tell them if you smoke, drink alcohol, or use illegal drugs. Some items may interact with your medicine. °What should I watch for while using this medication? °Your condition will be monitored carefully while you are receiving this medicine. You will need important blood work done while you are taking this  medicine. °Do not become pregnant while taking this medicine or for at least 1 year after stopping it. Women of child-bearing potential will need to have a negative pregnancy test before starting this medicine. Women should inform their doctor if they wish to become pregnant or think they might be pregnant. There is a potential for serious side effects to an unborn child. Men should inform their doctors if they wish to father a child. This medicine may lower sperm counts. Talk to your health care professional or pharmacist for more information. Do not breast-feed an infant while taking this medicine or for 1 year after the last dose. °What side effects may I notice from receiving this medication? °Side effects that you should report to your doctor or health care professional as soon as possible: °allergic reactions like skin rash, itching or hives, swelling of the face, lips, or tongue °feeling faint or lightheaded, falls °pain, tingling, numbness, or weakness in the legs °signs and symptoms of infection like fever or chills; cough; flu-like symptoms; sore throat °vaginal bleeding °Side effects that usually do not require medical attention (report to your doctor or health care professional if they continue or are bothersome): °aches, pains °constipation °diarrhea °headache °hot flashes °nausea, vomiting °pain at site where injected °stomach pain °This list may not describe all possible side effects. Call your doctor for medical advice about side effects. You may report side effects to FDA at 1-800-FDA-1088. °Where should I keep my medication? °This drug is given in a hospital or clinic and will not be stored at home. °NOTE: This sheet is a summary. It may not cover all possible information. If you have   questions about this medicine, talk to your doctor, pharmacist, or health care provider. °© 2022 [employer] (2017-07-14 00:00:00) ° °

## 2021-04-18 NOTE — Progress Notes (Signed)
ID: Tami Schmidt OB: [birth year]  MR#: [id number]  CSN#:[id number]  Patient Care Team: [name], MD as PCP - General (Family Medicine) Magrinat, Virgie Dad, MD as Consulting Physician (Oncology) Gentry Fitz, MD as Consulting Physician (Family Medicine) OTHER MD:   CHIEF COMPLAINT:  bilateral breast cancersadenopathy  CURRENT TREATMENT: Rivaroxaban; work-up in progress   INTERVAL HISTORY: Tami Schmidt returns today for follow up of her history of breast cancer and new left arm lymphedema.    CT of the chest with contrast 03/06/2021 showed nonocclusive segmental and subsegmental pulmonary emboli in the right upper lobe.  She was started on rivaroxaban and is tolerating that well so far, with no bleeding or bruising that she is aware of.  The chest CT also showed left axillary and subpectoral lymphadenopathy, explaining the new onset of left upper extremity lymphedema.  This was followed by left breast and axilla ultrasound on 03/29/2021 showing: two stable masses in infraclavicular subcutaneous tissues most consistent with fat necrosis and stable; a third lesion has similar characteristics also in infraclavicular subcutaneous region, possibly representing an additional focus of fat necrosis; however there are multiple retropectoral, interpectoral and left axillary masses suspicious for metastatic disease.  Biopsy of axillary mass showed ER positive PR positive, HER2 amplified invasive ductal carcinoma, grade 3 of 3 She is here to discuss our treatment recommendations.  Since last visit, she tells me that her lymphedema continues to bother her.  She has also noticed new onset left lower back pain about 10 days ago.  Other than that her knees bother her which is a chronic issue.  She denies any changes in breathing, bowel habits or urinary habits.  No new neurological complaints.  Rest of the pertinent 10 point ROS reviewed and negative.  COVID 19 VACCINATION STATUS:    BREAST  CANCER HISTORY: From the earlier summary:  Tami Schmidt's history of breast cancer dates back to 1993, when she underwent biopsy of her right breast in [place]. I do not have those details, but she moved to [place] shortly thereafter and it was in [place] where she had her right lumpectomy and axillary lymph node dissection. She had a total of 6 lymph nodes removed, she tells me, and all of them were clear. She does not recall the size of the primary. She was treated with adjuvant radiation and then received tamoxifen between 1993 and 1998.  In 2002 she had a local recurrence in the right breast, and underwent right mastectomy. She received tamoxifen between 2002 and 2007.  In 2004 she underwent a left mastectomy and sentinel lymph node sampling for a 9 mm mucinous carcinoma, grade 2, multifocal, associated with ductal carcinoma in situ. The single sentinel lymph node was negative. Margins were ample. She did not receive adjuvant radiation, but continued on tamoxifen as noted above, until 2007.  More recently, in July of 2014, the patient noted a "pimple" in the left chest wall, superior to her prior mastectomy incision. She brought this to the attention of her surgeon, Dr. Ninfa Linden and he describes a dimple area with a firm mass underneath. He also describes a large lipoma in the left clavicular area and a second one on her abdominal wall. Both of these were symptomatic. All 3 masses were removed 01/26/2013. The pathology from this procedure ([id number]) showed, in the chest wall, and invasive ductal carcinoma measuring grossly 1.3 cm. The lesion abutted the closest inked resection margin. The other 2 masses proved to be lipomas.  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/25/2013   Diabetes  mellitus    fasting 90-100   HTN (hypertension) 03/25/2013   Hx of radiation therapy    breasts hx   Hypercholesterolemia    Hypertension    Lymphedema of arm    left arm   Type II or unspecified type diabetes mellitus without mention of complication, not stated as uncontrolled 03/25/2013    PAST SURGICAL HISTORY: Past Surgical History:  Procedure Laterality Date   ABDOMINAL HYSTERECTOMY     ANKLE SURGERY Right 1995   APPENDECTOMY     BIOPSY  01/07/2018   Procedure: BIOPSY;  Surgeon: [name], MD;  Location: [facility];  Service: Gastroenterology;;   BREAST SURGERY Bilateral    mastectomy   COLONOSCOPY WITH PROPOFOL N/A 01/07/2018   Procedure: COLONOSCOPY WITH PROPOFOL;  Surgeon: [name], MD;  Location: [facility];  Service: Gastroenterology;  Laterality: N/A;   ESOPHAGOGASTRODUODENOSCOPY (EGD) WITH PROPOFOL N/A 01/06/2018   Procedure: ESOPHAGOGASTRODUODENOSCOPY (EGD) WITH PROPOFOL;  Surgeon: [name], MD;  [facility];  Service: Gastroenterology;  Laterality: N/A;   HERNIA REPAIR  04-26-2010   MASS EXCISION Left 01/26/2013   Procedure: EXCISION LEFT CHEST WALL MASS AND LEFT ABDOMNAL WALL MASS;  Surgeon: Harl Bowie, MD;  Location: [facility];  Service: General;  Laterality: Left;   MASS EXCISION Left 09/20/2014   Procedure: EXCISION OF LEFT CHEST WALL MASS;  Surgeon: Coralie Keens, MD;  Location: [facility];  Service: General;  Laterality: Left;   TEE WITHOUT CARDIOVERSION N/A 12/08/2017   Procedure: TRANSESOPHAGEAL ECHOCARDIOGRAM (TEE);  Surgeon: Lelon Perla, MD;  Location: [facility];  Service: Cardiovascular;  Laterality: N/A;   TOTAL HIP ARTHROPLASTY Right 01/05/2017   TOTAL HIP ARTHROPLASTY Right 01/05/2017   Procedure: TOTAL HIP ARTHROPLASTY ANTERIOR APPROACH;  Surgeon: Frederik Pear, MD;  Location: [facility];  Service: Orthopedics;  Laterality: Right;    FAMILY HISTORY The patient's father died from a heart attack at the age of  77. The patient's mother died at the age of 34 from myocarditis. The patient had no brothers, one sister. There is no history of breast or ovarian cancer in the family to her knowledge   GYNECOLOGIC HISTORY:   (Reviewed 09/22/2013) Menarche age 52, first live birth age 73, the patient is [place] P5. She underwent hysterectomy in 1984. She did not use hormone replacement.   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 Their children are: Reed Breech who lives in [place] and teaches theater there; Julius Bowels who works as a Social worker in [place]; Archivist who lives in [place] and is a "radio personality"; Elta Guadeloupe, who is a Biomedical scientist in [place], and Legrand Como who is a musician living in [place]. The patient has 2 grandchildren. She attends a [employer]    ADVANCED DIRECTIVES: Not in place   HEALTH MAINTENANCE:  Social History   Tobacco Use   Smoking status: Former    Types: Cigarettes    Quit date: 04/14/1972    Years since quitting: 49.0   Smokeless tobacco: Never  Vaping Use   Vaping Use: Never used  Substance Use Topics   Alcohol  use: Yes    Comment: occasional   Drug use: No    Colonoscopy: 2011  PAP: Status post hysterectomy  Bone density: 1993 ; repeat 12/27/2013 showed osteopenia with a T score of -1.8  Lipid panel: not on file  Allergies  Allergen Reactions   Bactrim Swelling    SWELLING OF MOUTH/FACE.   Lisinopril Swelling    SWELLING OF MOUTH/FACE.   Vasotec Swelling    SWELLING OF MOUTH/FACE.   Sulfamethoxazole-Trimethoprim     Other reaction(s): Unknown    Current Outpatient Medications  Medication Sig Dispense Refill   amLODipine (NORVASC) 10 MG tablet Take 10 mg by mouth daily with breakfast.      benzonatate (TESSALON) 100 MG capsule Take 1 capsule (100 mg total) by mouth 3 (three) times daily as needed for cough. 20 capsule 0   diclofenac Sodium  (VOLTAREN) 1 % GEL Apply 2 g topically daily as needed (For topical arthritis.).     HUMULIN N 100 UNIT/ML injection Inject 25-30 Units into the skin daily.      metFORMIN (GLUCOPHAGE) 500 MG tablet Take 500 mg by mouth daily with breakfast.     olmesartan-hydrochlorothiazide (BENICAR HCT) 40-12.5 MG tablet Take 1 tablet by mouth daily.     RIVAROXABAN (XARELTO) VTE STARTER PACK (15 & 20 MG) Follow package directions: Take one 73m tablet by mouth twice a day. On day 22, switch to one 281mtablet once a day. Take with food. 51 each 0   SM IRON 325 (65 Fe) MG tablet Take 325 mg by mouth daily.     TYLENOL 325 MG tablet Take 325 mg by mouth daily.     No current facility-administered medications for this visit.    OBJECTIVE: African-American woman who appears stated age  Vi29  04/18/21 1046  BP: (!) 159/71  Pulse: 88  Resp: 18  Temp: 98.1 F (36.7 C)  SpO2: 100%     Body mass index is 34.38 kg/m.    ECOG FS: 2 Filed Weights   04/18/21 1046  Weight: 213 lb (96.6 kg)   Sclerae unicteric, EOMs intact Wearing a mask She has small palpable 1/3-1 /2 centimeter spots along the left clavicular area, which are unchanged from baseline Lungs no rales or rhonchi Heart regular rate and rhythm Abd soft, obese, nontender, positive bowel sounds MSK no focal spinal tenderness, left upper extremity lymphedema with sleeve in place Neuro: nonfocal, well oriented, appropriate affect Breasts: Status post bilateral mastectomies.  I do not feel palpable adenopathy in the left axilla.   LAB RESULTS:  Lab Results  Component Value Date   WBC 5.6 04/18/2021   NEUTROABS 3.4 04/18/2021   HGB 12.0 04/18/2021   HCT 38.5 04/18/2021   MCV 80.0 04/18/2021   PLT 230 04/18/2021      Chemistry      Component Value Date/Time   NA 140 04/18/2021 1023   NA 139 01/20/2017 1057   K 4.1 04/18/2021 1023   K 3.9 01/20/2017 1057   CL 106 04/18/2021 1023   CO2 26 04/18/2021 1023   CO2 25 01/20/2017 1057    BUN 18 04/18/2021 1023   BUN 15.6 01/20/2017 1057   CREATININE 0.74 04/18/2021 1023   CREATININE 0.7 01/20/2017 1057      Component Value Date/Time   CALCIUM 8.8 (L) 04/18/2021 1023   CALCIUM 9.3 01/20/2017 1057   ALKPHOS 59 04/18/2021 1023   ALKPHOS 69 01/20/2017 1057   AST 10 (L) 04/18/2021 1023  AST 10 01/20/2017 1057   ALT 5 04/18/2021 1023   ALT 14 01/20/2017 1057   BILITOT 0.5 04/18/2021 1023   BILITOT 0.43 01/20/2017 1057      STUDIES: DG Chest 2 View  Result Date: 03/19/2021 CLINICAL DATA:  Cough for the past 3 days. EXAM: CHEST - 2 VIEW COMPARISON:  CT chest dated March 05, 2021. FINDINGS: The heart size and mediastinal contours are within normal limits. Normal pulmonary vascularity. No focal consolidation, pleural effusion, or pneumothorax. No acute osseous abnormality. IMPRESSION: No active cardiopulmonary disease. Electronically Signed   By: Titus Dubin M.D.   On: 03/19/2021 13:49   US BREAST LTD UNI LEFT INC AXILLA  Result Date: 03/29/2021 CLINICAL DATA:  Worsening nodularity of chest wall, new onset LEFT upper extremity lymphedema. History of bilateral mastectomy for LEFT breast cancer in 2004. Patient had recurrence at the LEFT chest wall in 2014. Recent chest CT shows LEFT axillary and subpectoral lymphadenopathy concerning for metastatic disease. Patient also had a benign lipoma removed from the LEFT infraclavicular region and has had subsequent imaging showing fat necrosis in this region. EXAM: ULTRASOUND OF THE LEFT BREAST COMPARISON:  Previous exams, including CT chest on 03/05/2021 and 02/09/2018 previous ultrasound exams 09/06/2014, 01/24/2014 and earlier FINDINGS: On physical exam, I palpate discrete nodule in the superficial LEFT infraclavicular region. There is also irregularity of the deeper tissues in the LEFT chest wall. Targeted ultrasound is performed, showing a circumscribed oval hypoechoic calcified mass in the LEFT breast 4 centimeters from the  clavicle measuring 1.3 x 0.9 x 0.8 centimeters and consistent with calcified oil cyst, stable compared to prior ultrasound and CT exams. 3 centimeters below the LEFT clavicle, a smaller oval hypoechoic mass is 0.4 x 0.4 x 0.3 centimeters and stable compared with prior study, consistent with fat necrosis. Superior to these nodules, a third nodule is heterogeneous and contains a central calcification, measuring 1.4 x 0.7 x 0.5 centimeters. This lesion has not been imaged previously, but has similar features, likely fat necrosis. Deeper to these subcutaneous masses, there are multiple masses in the retropectoral region. The largest of these is partially calcified and correlates well with the recent CT exam showing mass to measure 4.0 x 2.0 centimeters and increased in size from previous 2019 CT exam. Additional subpectoral and intra-pectoral nodules are identified in the anterior chest wall. In the LEFT axilla, a hypoechoic mass is 3.4 x 1.9 x 2.5 centimeters. Just MEDIAL to this mass is a solid mass measuring 1.8 centimeters. Additional lymph nodes with cortical thickening are also present in the retropectoral region. IMPRESSION: 1. Two stable masses in the infraclavicular subcutaneous tissues are most consistent with fat necrosis and appear stable. A third lesion has similar characteristics also in the infraclavicular subcutaneous region, possibly representing an additional focus of fat necrosis. 2. Multiple retropectoral, interpectoral and LEFT axillary masses are suspicious for metastatic disease. RECOMMENDATION: Recommend ultrasound-guided core biopsy of LEFT axillary mass. I have discussed the findings and recommendations with the patient. If applicable, a reminder letter will be sent to the patient regarding the next appointment. BI-RADS CATEGORY  4: Suspicious. Electronically Signed   By: Nolon Nations M.D.   On: 03/29/2021 12:24  [place] AXILLARY NODE CORE BIOPSY LEFT  Addendum Date: 04/10/2021   ADDENDUM  REPORT: 04/10/2021 16:11 ADDENDUM: Pathology revealed GRADE III INVASIVE DUCTAL CARCINOMA, NO NODAL TISSUE IDENTIFIED of the LEFT axilla, upper outer, (ribbon clip). This was found to be concordant by Dr. Lovey Newcomer. Pathology results were discussed  with the patient by telephone by Stacie Acres, RN Nurse Navigator. The patient reported doing well after the biopsy with tenderness at the site. Post biopsy instructions and care were reviewed and questions were answered. The patient was encouraged to call [facility] for any additional concerns. Medical oncology consultation has been arranged with Dr. Benay Pike at [facility] on April 18, 2021. NOTE: A small mass within the upper-outer LEFT axilla was identified for biopsy as the larger deeper mass within the LEFT axilla was not amenable under ultrasound guidance. The patient has markedly limited motion of the LEFT shoulder due to degenerative changes. Pathology results reported by Terie Purser, RN on 04/10/2021. Electronically Signed   By: Lovey Newcomer M.D.   On: 04/10/2021 16:11   Result Date: 04/10/2021 CLINICAL DATA:  Patient with multiple thickened left axillary lymph nodes, for biopsy. EXAM: [place] AXILLARY NODE CORE BIOPSY LEFT COMPARISON:  Previous exam(s). PROCEDURE: I met with the patient and we discussed the procedure of ultrasound-guided biopsy, including benefits and alternatives. We discussed the high likelihood of a successful procedure. We discussed the risks of the procedure, including infection, bleeding, tissue injury, clip migration, and inadequate sampling. Informed written consent was given. The usual time-out protocol was performed immediately prior to the procedure. Prior to biopsy, the left axilla was scanned. The patient has markedly limited motion of the left shoulder due to degenerative changes. Lack of range of motion within the left upper extremity significantly limited options of biopsy. A small  mass within the upper-outer left axilla was identified for biopsy as the larger deeper mass within the left axilla was not amenable under ultrasound guidance. Using sterile technique and 1% Lidocaine as local anesthetic, under direct ultrasound visualization, a 14 gauge spring-loaded device was used to perform biopsy of upper-outer left axillary node using a lateral approach. At the conclusion of the procedure ribbon shaped tissue marker clip was deployed into the biopsy cavity. Follow up 2 view mammogram was performed and dictated separately. IMPRESSION: Ultrasound guided biopsy of upper-outer left axillary node. No apparent complications. Electronically Signed: By: Lovey Newcomer M.D. On: 04/04/2021 12:06  MM CLIP PLACEMENT LEFT  Result Date: 04/04/2021 CLINICAL DATA:  Patient status post ultrasound-guided biopsy of a thickened left axillary lymph node. EXAM: 3D DIAGNOSTIC LEFT MAMMOGRAM POST ULTRASOUND BIOPSY COMPARISON:  Previous exam(s). FINDINGS: 3D Mammographic images were obtained following ultrasound guided biopsy of of a thickened left axillary lymph node. The biopsy marking clip is not able to be visualized given the location. IMPRESSION: The biopsy marking clip is not able to be visualized given the posterior depth. Final Assessment: Post Procedure Mammograms for Marker Placement Electronically Signed   By: Lovey Newcomer M.D.   On: 04/04/2021 12:09    ASSESSMENT: 88 y.o. [place] woman  (1) status post right lumpectomy and axillary dissection in 1993 in [place] for what appears to have been a stage I breast cancer, treated with radiation and then tamoxifen for 5 years  (2) status post right mastectomy 2002 for a recurrence in the right breast, treated adjuvantly with tamoxifen for an additional 5 years  (3) status post left mastectomy and sentinel lymph node sampling 03/07/2003 for a lower inner quadrant pT1b, pN0, stage IA mucinous breast cancer, grade 2; no radiation therapy was given.  Tamoxifen was continued until 2009  RECURRENT DISEASE/ LEFT: OCTOBER 2014 (4) excisional biopsy of a left chest wall mass 01/26/2013 showing carcinoma consistent with invasive ductal carcinoma, estrogen receptor  100% positive, progesterone receptor 0% positive, with an MIB-1 of 22% and no HER-2 amplification.   (5)  PET scan on 03/03/2013 confirmed extensive metastatic adenopathy in the left axilla, left subpectoral musculature, and left supraclavicular region  (6)  started on anastrozole daily beginning 03/01/2013, interrupted during radiation therapy, resumed March 2015, discontinued March 2016 because of fatigue and arthralgias  (7) radiation therapy 03/31/2013 through 05/23/2013 Site/dose:   The patient was treated initially with a forward treatment planning technique to the left chest wall in addition to treatment to the supraclavicular region. This consisted of a 3-D conformal technique. The patient was treated in this fashion to a dose of 50.4 gray. The patient then received a 14 gray boost treatment using an electron field. The total dose was 64.4 gray.  (8) Osteopenia, zometa yearly started 07/27/2014, but poorly tolerated.   (a) bone density scan on 12/27/13 showed a t-score of -1.8   (b) started Denosumab/Prolia April 2017, canceled after one dose per patient  (9) tamoxifen started 07/27/2014, stopped November 2019 (had total 5 years of antiestrogens)  (10) likely thalassemia trait  (11) left subpectoral soft tissue nodule noted 07/17/2015 unchanged through serial scans, most recent 69/62/9528, with no metabolic activity noted in PET scan of 2016 and interval calcification.  This is presumed benign  (12)  ADDITIONAL REGIONAL RECURRENCE:  (A) CT of the chest with contrast 03/05/2021 obtained to evaluate left upper extremity lymphedema as showed acute pulmonary emboli.  There was also left axillary and subpectoral lymphadenopathy  (B) rivaroxaban started 03/05/2021  (C) left breast  ultrasound 03/29/2021 shows 2 areas in the infraclavicular tissue consistent with fat necrosis.  There were however multiple retropectoral interpectoral and left axillary masses.  (D) left axillary lymph node biopsy 04/04/2021  She had biopsy of the left axillary lymph node mass which showed invasive ductal carcinoma, grade 3, ER positive, 100%, strong staining intensity, PR positive, 20%, weak staining intensity, Ki-67 of 15%, HER2 positive by IHC, ratio 2.90 She was initially scheduled for fulvestrant however given her to positive breast cancer, we have discussed about additional therapy today.  PLAN:   I have discussed the biology of the breast cancer being different from her previous breast cancers.  We have discussed about considering fulvestrant along with the trastuzumab given her HER2 amplified breast cancer.  I have explained to her pathogenesis of HER2 amplified breast cancer, mechanism of action of trastuzumab as well as adverse effects of trastuzumab in detail.   We have discussed about cardiotoxicity from trastuzumab and the need for echocardiogram on a periodic basis.  Echo ordered today. I have discussed about proceeding with systemic scans since there is concern for metastatic cancer, patient also has new onset low back pain.  If she has trouble making it to multiple appointments given different schedules on fulvestrant and trastuzumab, we could also consider doing trastuzumab and exemestane while we are awaiting additional molecular testing (in case she is metastatic) She understands that there could be aggressive treatment strategies for this particular breast cancer however given her age, comorbidities and her current performance status, it is reasonable to try single agent trastuzumab and evaluate her tolerance and response and act accordingly. I have explained to the patient that it is unlikely that anticoagulation with Xarelto has been causing her low back pain, she should continue  anticoagulation as prescribed, prescription refill has been sent to pharmacy of her choice We will arrange for trastuzumab infusions every 21 days, she will continue fulvestrant and return  to clinic in 3 to 4 weeks.  Trastuzumab and fulvestrant Imaging Continue Xarelto Echo to be scheduled  Total encounter time 45 minutes.   *Total Encounter Time as defined by the Centers for Medicare and Medicaid Services includes, in addition to the face-to-face time of a patient visit (documented in the note above) non-face-to-face time: obtaining and reviewing outside history, ordering and reviewing medications, tests or procedures, care coordination (communications with other health care professionals or caregivers) and documentation in the medical record.

## 2021-04-18 NOTE — Progress Notes (Signed)
START ON PATHWAY REGIMEN - Breast     Cycle 1: A cycle is 21 days:     Trastuzumab-xxxx    Cycles 2 and beyond: A cycle is every 21 days:     Trastuzumab-xxxx   **Always confirm dose/schedule in your pharmacy ordering system**  Patient Characteristics: Distant Metastases or Locoregional Recurrent Disease - Unresected or Locally Advanced Unresectable Disease Progressing after Neoadjuvant and Local Therapies, HER2 Positive, ER Positive, HER2-Targeted Therapy (Concurrent with Endocrine Therapy) Therapeutic Status: Locoregional Recurrent Disease - Unresected HER2 Status: Positive (+) ER Status: Positive (+) PR Status: Positive (+) Intent of Therapy: Non-Curative / Palliative Intent, Discussed with Patient 

## 2021-04-18 NOTE — Telephone Encounter (Signed)
Open by accident, please disregard.

## 2021-04-18 NOTE — Telephone Encounter (Signed)
Scheduled per 01/05 scheduled message, patient has been called and voicemail was left.

## 2021-04-19 ENCOUNTER — Encounter: Payer: Self-pay | Admitting: *Deleted

## 2021-04-19 ENCOUNTER — Other Ambulatory Visit: Payer: Self-pay | Admitting: *Deleted

## 2021-04-19 ENCOUNTER — Other Ambulatory Visit (HOSPITAL_COMMUNITY): Payer: Self-pay

## 2021-04-19 LAB — CANCER ANTIGEN 27.29: CA 27.29: 37.8 U/mL (ref 0.0–38.6)

## 2021-04-19 MED ORDER — RIVAROXABAN 20 MG PO TABS: 20.0000 mg | ORAL_TABLET | Freq: Every day | ORAL | 3 refills | Status: DC

## 2021-04-22 ENCOUNTER — Other Ambulatory Visit: Payer: Self-pay | Admitting: *Deleted

## 2021-04-23 ENCOUNTER — Encounter: Payer: Self-pay | Admitting: Hematology and Oncology

## 2021-04-23 ENCOUNTER — Encounter: Payer: Self-pay | Admitting: *Deleted

## 2021-04-23 NOTE — Addendum Note (Signed)
Addended by: Tora Kindred on: 04/23/2021 03:47 PM   Modules accepted: Orders

## 2021-04-24 ENCOUNTER — Telehealth: Payer: Self-pay | Admitting: Hematology and Oncology

## 2021-04-24 ENCOUNTER — Other Ambulatory Visit: Payer: Self-pay | Admitting: *Deleted

## 2021-04-24 DIAGNOSIS — C50312 Malignant neoplasm of lower-inner quadrant of left female breast: Secondary | ICD-10-CM

## 2021-04-24 DIAGNOSIS — Z170 Estrogen receptor positive status [ER+]: Secondary | ICD-10-CM

## 2021-04-24 NOTE — Telephone Encounter (Signed)
Spoke to patient to confirm upcoming appointments, gave patient location and times, will also mail a calendar to patient to help keep up with appointments as well

## 2021-04-25 ENCOUNTER — Encounter: Payer: Self-pay | Admitting: Hematology and Oncology

## 2021-04-25 ENCOUNTER — Encounter: Payer: Self-pay | Admitting: Oncology

## 2021-04-25 ENCOUNTER — Telehealth: Payer: Self-pay | Admitting: Hematology and Oncology

## 2021-04-25 NOTE — Telephone Encounter (Signed)
Sch per 1/12 inbasket,pt aware °

## 2021-04-27 ENCOUNTER — Inpatient Hospital Stay (HOSPITAL_COMMUNITY): Payer: Medicare Other

## 2021-04-27 ENCOUNTER — Encounter (HOSPITAL_BASED_OUTPATIENT_CLINIC_OR_DEPARTMENT_OTHER): Payer: Self-pay

## 2021-04-27 ENCOUNTER — Other Ambulatory Visit: Payer: Self-pay

## 2021-04-27 ENCOUNTER — Inpatient Hospital Stay (HOSPITAL_BASED_OUTPATIENT_CLINIC_OR_DEPARTMENT_OTHER)
Admission: EM | Admit: 2021-04-27 | Discharge: 2021-04-30 | DRG: 394 | Disposition: A | Discharge status: Home / Self Care | Payer: Medicare Other | Attending: Internal Medicine | Admitting: Internal Medicine

## 2021-04-27 ENCOUNTER — Emergency Department (HOSPITAL_BASED_OUTPATIENT_CLINIC_OR_DEPARTMENT_OTHER): Payer: Medicare Other

## 2021-04-27 DIAGNOSIS — C50919 Malignant neoplasm of unspecified site of unspecified female breast: Secondary | ICD-10-CM | POA: Diagnosis present

## 2021-04-27 DIAGNOSIS — C779 Secondary and unspecified malignant neoplasm of lymph node, unspecified: Secondary | ICD-10-CM | POA: Diagnosis present

## 2021-04-27 DIAGNOSIS — Z90710 Acquired absence of both cervix and uterus: Secondary | ICD-10-CM

## 2021-04-27 DIAGNOSIS — M1990 Unspecified osteoarthritis, unspecified site: Secondary | ICD-10-CM | POA: Diagnosis present

## 2021-04-27 DIAGNOSIS — Z923 Personal history of irradiation: Secondary | ICD-10-CM

## 2021-04-27 DIAGNOSIS — Z888 Allergy status to other drugs, medicaments and biological substances status: Secondary | ICD-10-CM

## 2021-04-27 DIAGNOSIS — Z7901 Long term (current) use of anticoagulants: Secondary | ICD-10-CM | POA: Diagnosis not present

## 2021-04-27 DIAGNOSIS — E7800 Pure hypercholesterolemia, unspecified: Secondary | ICD-10-CM | POA: Diagnosis present

## 2021-04-27 DIAGNOSIS — C44501 Unspecified malignant neoplasm of skin of breast: Secondary | ICD-10-CM | POA: Diagnosis present

## 2021-04-27 DIAGNOSIS — Z96641 Presence of right artificial hip joint: Secondary | ICD-10-CM | POA: Diagnosis present

## 2021-04-27 DIAGNOSIS — Z20822 Contact with and (suspected) exposure to covid-19: Secondary | ICD-10-CM | POA: Diagnosis present

## 2021-04-27 DIAGNOSIS — Z794 Long term (current) use of insulin: Secondary | ICD-10-CM

## 2021-04-27 DIAGNOSIS — Z978 Presence of other specified devices: Secondary | ICD-10-CM

## 2021-04-27 DIAGNOSIS — D649 Anemia, unspecified: Secondary | ICD-10-CM | POA: Diagnosis not present

## 2021-04-27 DIAGNOSIS — I10 Essential (primary) hypertension: Secondary | ICD-10-CM | POA: Diagnosis present

## 2021-04-27 DIAGNOSIS — E1165 Type 2 diabetes mellitus with hyperglycemia: Secondary | ICD-10-CM | POA: Diagnosis present

## 2021-04-27 DIAGNOSIS — R110 Nausea: Secondary | ICD-10-CM | POA: Diagnosis not present

## 2021-04-27 DIAGNOSIS — Z79899 Other long term (current) drug therapy: Secondary | ICD-10-CM | POA: Diagnosis not present

## 2021-04-27 DIAGNOSIS — K56609 Unspecified intestinal obstruction, unspecified as to partial versus complete obstruction: Secondary | ICD-10-CM

## 2021-04-27 DIAGNOSIS — E876 Hypokalemia: Secondary | ICD-10-CM | POA: Diagnosis present

## 2021-04-27 DIAGNOSIS — K436 Other and unspecified ventral hernia with obstruction, without gangrene: Secondary | ICD-10-CM | POA: Diagnosis present

## 2021-04-27 DIAGNOSIS — I2699 Other pulmonary embolism without acute cor pulmonale: Secondary | ICD-10-CM | POA: Diagnosis present

## 2021-04-27 DIAGNOSIS — Z853 Personal history of malignant neoplasm of breast: Secondary | ICD-10-CM | POA: Diagnosis not present

## 2021-04-27 DIAGNOSIS — Z86711 Personal history of pulmonary embolism: Secondary | ICD-10-CM

## 2021-04-27 DIAGNOSIS — Z87891 Personal history of nicotine dependence: Secondary | ICD-10-CM | POA: Diagnosis not present

## 2021-04-27 DIAGNOSIS — Z0189 Encounter for other specified special examinations: Secondary | ICD-10-CM

## 2021-04-27 DIAGNOSIS — Z7984 Long term (current) use of oral hypoglycemic drugs: Secondary | ICD-10-CM

## 2021-04-27 DIAGNOSIS — R718 Other abnormality of red blood cells: Secondary | ICD-10-CM | POA: Diagnosis present

## 2021-04-27 DIAGNOSIS — I2693 Single subsegmental pulmonary embolism without acute cor pulmonale: Secondary | ICD-10-CM | POA: Diagnosis not present

## 2021-04-27 DIAGNOSIS — E119 Type 2 diabetes mellitus without complications: Secondary | ICD-10-CM

## 2021-04-27 DIAGNOSIS — Z9013 Acquired absence of bilateral breasts and nipples: Secondary | ICD-10-CM

## 2021-04-27 DIAGNOSIS — R109 Unspecified abdominal pain: Secondary | ICD-10-CM | POA: Diagnosis not present

## 2021-04-27 HISTORY — DX: Other pulmonary embolism without acute cor pulmonale: I26.99

## 2021-04-27 LAB — URINALYSIS, ROUTINE W REFLEX MICROSCOPIC
Bilirubin Urine: NEGATIVE
Glucose, UA: >1000 mg/dL — AB
Ketones, ur: 40 mg/dL — AB
Leukocytes,Ua: NEGATIVE
Nitrite: NEGATIVE
Protein, ur: 100 mg/dL — AB
Specific Gravity, Urine: 1.013 (ref 1.005–1.030)
pH: 7 (ref 5.0–8.0)

## 2021-04-27 LAB — COMPREHENSIVE METABOLIC PANEL
ALT: 7 U/L (ref 0–44)
AST: 14 U/L — ABNORMAL LOW (ref 15–41)
Albumin: 4.5 g/dL (ref 3.5–5.0)
Alkaline Phosphatase: 58 U/L (ref 38–126)
Anion gap: 11 (ref 5–15)
BUN: 14 mg/dL (ref 8–23)
CO2: 26 mmol/L (ref 22–32)
Calcium: 10 mg/dL (ref 8.9–10.3)
Chloride: 97 mmol/L — ABNORMAL LOW (ref 98–111)
Creatinine, Ser: 0.57 mg/dL (ref 0.44–1.00)
GFR, Estimated: >60 mL/min (ref >60)
Glucose, Bld: 206 mg/dL — ABNORMAL HIGH (ref 70–99)
Potassium: 3.5 mmol/L (ref 3.5–5.1)
Sodium: 134 mmol/L — ABNORMAL LOW (ref 135–145)
Total Bilirubin: 0.7 mg/dL (ref 0.3–1.2)
Total Protein: 8 g/dL (ref 6.5–8.1)

## 2021-04-27 LAB — RESP PANEL BY RT-PCR (FLU A&B, COVID) ARPGX2
Influenza A by PCR: NEGATIVE
Influenza B by PCR: NEGATIVE
SARS Coronavirus 2 by RT PCR: NEGATIVE

## 2021-04-27 LAB — CBC
HCT: 41.4 % (ref 36.0–46.0)
Hemoglobin: 13.1 g/dL (ref 12.0–15.0)
MCH: 24.5 pg — ABNORMAL LOW (ref 26.0–34.0)
MCHC: 31.6 g/dL (ref 30.0–36.0)
MCV: 77.5 fL — ABNORMAL LOW (ref 80.0–100.0)
Platelets: 273 10*3/uL (ref 150–400)
RBC: 5.34 MIL/uL — ABNORMAL HIGH (ref 3.87–5.11)
RDW: 16.2 % — ABNORMAL HIGH (ref 11.5–15.5)
WBC: 8.6 10*3/uL (ref 4.0–10.5)
nRBC: 0 % (ref 0.0–0.2)

## 2021-04-27 LAB — LIPASE, BLOOD: Lipase: <10 U/L — ABNORMAL LOW (ref 11–51)

## 2021-04-27 LAB — CBG MONITORING, ED
Glucose-Capillary: 236 mg/dL — ABNORMAL HIGH (ref 70–99)
Glucose-Capillary: 130 mg/dL — ABNORMAL HIGH (ref 70–99)

## 2021-04-27 MED ORDER — FENTANYL CITRATE PF 50 MCG/ML IJ SOSY
50.0000 ug | PREFILLED_SYRINGE | INTRAMUSCULAR | Status: DC | PRN
Start: 2021-04-27 — End: 2021-04-30

## 2021-04-27 MED ORDER — LIDOCAINE VISCOUS HCL 2 % MT SOLN
15.0000 mL | Freq: Once | OROMUCOSAL | Status: AC
Start: 2021-04-27 — End: 2021-04-27
  Administered 2021-04-27: 15 mL via OROMUCOSAL
  Filled 2021-04-27: qty 15

## 2021-04-27 MED ORDER — NALOXONE HCL 0.4 MG/ML IJ SOLN: 0.4000 mg | INTRAMUSCULAR | Status: DC | PRN

## 2021-04-27 MED ORDER — ACETAMINOPHEN 325 MG PO TABS: 650.0000 mg | ORAL_TABLET | Freq: Four times a day (QID) | ORAL | Status: DC | PRN

## 2021-04-27 MED ORDER — ACETAMINOPHEN 650 MG RE SUPP: 650.0000 mg | Freq: Four times a day (QID) | RECTAL | Status: DC | PRN

## 2021-04-27 MED ORDER — ONDANSETRON HCL 4 MG/2ML IJ SOLN
4.0000 mg | Freq: Once | INTRAMUSCULAR | Status: AC
  Administered 2021-04-27: 4 mg via INTRAVENOUS
  Filled 2021-04-27: qty 2

## 2021-04-27 MED ORDER — FENTANYL CITRATE PF 50 MCG/ML IJ SOSY
50.0000 ug | PREFILLED_SYRINGE | Freq: Once | INTRAMUSCULAR | Status: AC
  Administered 2021-04-27: 50 ug via INTRAVENOUS
  Filled 2021-04-27: qty 1

## 2021-04-27 MED ORDER — HEPARIN (PORCINE) 25000 UT/250ML-% IV SOLN
1300.0000 [IU]/h | INTRAVENOUS | Status: DC
  Administered 2021-04-27 – 2021-04-30 (×5): 1300 [IU]/h via INTRAVENOUS
  Filled 2021-04-27 (×4): qty 250

## 2021-04-27 MED ORDER — IOHEXOL 300 MG/ML  SOLN
100.0000 mL | Freq: Once | INTRAMUSCULAR | Status: AC | PRN
  Administered 2021-04-27: 100 mL via INTRAVENOUS

## 2021-04-27 MED ORDER — ONDANSETRON HCL 4 MG/2ML IJ SOLN: 4.0000 mg | Freq: Four times a day (QID) | INTRAMUSCULAR | Status: DC | PRN

## 2021-04-27 MED ORDER — INSULIN ASPART 100 UNIT/ML IJ SOLN: 0.0000 [IU] | Freq: Four times a day (QID) | INTRAMUSCULAR | Status: DC

## 2021-04-27 MED ORDER — LACTATED RINGERS IV SOLN: INTRAVENOUS | Status: DC

## 2021-04-27 NOTE — ED Notes (Signed)
Spoke w/Dr Langston Masker, pt is NPO except for ice chips.

## 2021-04-27 NOTE — H&P (Addendum)
History and Physical    PLEASE NOTE THAT DRAGON DICTATION SOFTWARE WAS USED IN THE CONSTRUCTION OF THIS NOTE.   Tami Schmidt KDT:[id number] DOB: 1932-08-19 DOA: 04/27/2021  PCP: [name], MD  Patient coming from: home   I have personally briefly reviewed patient's old medical records in [place]  Chief Complaint: Abdominal pain  HPI: Tami Schmidt is a 88 y.o. female with medical history significant for breast cancer status post radiation and undergoing chemotherapy, recently diagnosed acute pulmonary embolism (04/18/21) now on Xarelto, type 2 diabetes mellitus, essential hypertension, who is admitted to [facility] on 04/27/2021 by way of transfer from [facility] ED with small bowel obstruction after presenting from home to the latter facility complaining of abdominal pain.  The patient reports  1-2  days of progressive abdominal pain. Describes pain as sharp discomfort in diffuse distribution, without radiation into the back or flank. Has been constant since onset, and notes exacerbation with movement and with palpation over the abdomen. Notes associated intermittent nausea over that timeframe resulting in significant decline in oral intake over that timeframe, but without report of associated vomiting. Notes most recent bowel movement occurred 2 days ago, and conveys that this was associated with well-formed stool in the absence of any recent loose stool. Reports associated decrease in flatus production. denies any recent melena or hematochezia.  No recent trauma or travel. Denies any recent dysuria, gross hematuria, or change in urinary urgency/frequency. Denies any associated subjective fever, chills, rigors, or generalized myalgias.    Notable past surgical history includes history of hysterectomy as well as appendectomy.   Medical history also notable for recent diagnosis of acute pulmonary embolism, with diagnosis occurring on 04/18/2021 followed by  initiation of Xarelto, on 04/19/21.  At the time of today's presentation, patient denies any acute chest pain, shortness of breath, palpitations, diaphoresis.  Most recent dose of Xarelto was taken on 04/26/2021.     [facility] ED Course:  Vital signs in the ED were notable for the following: Afebrile; heart rate 91-98; blood pressure 146/73  - 176/75, and most recently 158/80; respiratory rate 17-22, oxygen Saturation 96 -100 percent on room air.  Labs were notable for the following: CMP notable for the following: Potassium 3.5, bicarbonate 26, anion gap 11, creatinine 0.57 compared to most recent prior value of 0.74 on 04/18/2021, glucose 206, and liver enzymes within normal limits.  Lipase 10.  CBC notable for white cell count 8600, hemoglobin 13.1 compared to 12 1 04/18/2021, platelet count 273.  Urinalysis showed no white blood cells, leukocyte Estrace/nitrate negative.  COVID-19/flu PCR negative.  Imaging and additional notable ED work-up: EKG shows sinus arrhythmia with heart rate 94; normal intervals, nonspecific T wave flattening in lead III, and no evidence of ST changes, including no evidence of ST elevation.  CT abdomen/pelvis with contrast showed evidence of small bowel obstruction with transition point located within the patient's inferior ventral abdominal wall hernia, also demonstrating evidence of colonic diverticulosis without evidence of diverticulitis, and demonstrated no evidence of small bowel/colonic abscess or perforation; CT abdomen/pelvis also showed evidence of cholelithiasis without evidence of acute cholecystitis, and no evidence of radiopaque choledocholithiasis, nor any dilation of the CBD.  EDP discussed the patient's case with the on-call general surgeon, Dr.Burke Grandville Silos , who recommended admission to the hospitalist service at [facility] for further evaluation management of presenting small bowel obstruction, with plan for Dr. Grandville Silos to consult, recommending interval  initiation of conservative measures for SBO, without  evidence of strangulation of associated ventral wall hernia.  While in the ED, the following were administered: Fentanyl 50 mcg IV x2, Zofran 4 mg IV x1, and continuous LR at 100 cc/h plus initiated.  Subsequently, the patient was transferred to [facility] for admission to the [place] unit for further evaluation management of presenting small bowel obstruction.     Review of Systems: As per HPI otherwise 10 point review of systems negative.   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   Diabetes mellitus    fasting 90-100   HTN (hypertension) 03/25/2013   Hx of radiation therapy    breasts hx   Hypercholesterolemia    Hypertension    Lymphedema of arm    left arm   Type II or unspecified type diabetes mellitus without mention of complication, not stated as uncontrolled 03/25/2013    Past Surgical History:  Procedure Laterality Date   ABDOMINAL HYSTERECTOMY     ANKLE SURGERY Right 1995   APPENDECTOMY     BIOPSY  01/07/2018   Procedure: BIOPSY;  Surgeon: [name], MD;  Location: [facility];  Service: Gastroenterology;;   BREAST SURGERY Bilateral    mastectomy   COLONOSCOPY WITH PROPOFOL N/A 01/07/2018   Procedure: COLONOSCOPY WITH PROPOFOL;  Surgeon: [name], MD;  Location: [facility];  Service: Gastroenterology;  Laterality: N/A;   ESOPHAGOGASTRODUODENOSCOPY (EGD) WITH PROPOFOL N/A 01/06/2018   Procedure: ESOPHAGOGASTRODUODENOSCOPY (EGD) WITH PROPOFOL;  Surgeon: [name], MD;  Location: [facility];  Service: Gastroenterology;  Laterality: N/A;   HERNIA REPAIR  04-26-2010   MASS EXCISION Left 01/26/2013   Procedure: EXCISION LEFT CHEST WALL MASS AND LEFT ABDOMNAL WALL MASS;  Surgeon: Harl Bowie, MD;  Location: [employer];  Service: General;  Laterality: Left;   MASS EXCISION  Left 09/20/2014   Procedure: EXCISION OF LEFT CHEST WALL MASS;  Surgeon: Coralie Keens, MD;  Location: [place];  Service: General;  Laterality: Left;   TEE WITHOUT CARDIOVERSION N/A 12/08/2017   Procedure: TRANSESOPHAGEAL ECHOCARDIOGRAM (TEE);  Surgeon: Lelon Perla, MD;  Location: [place];  Service: Cardiovascular;  Laterality: N/A;   TOTAL HIP ARTHROPLASTY Right 01/05/2017   TOTAL HIP ARTHROPLASTY Right 01/05/2017   Procedure: TOTAL HIP ARTHROPLASTY ANTERIOR APPROACH;  Surgeon: Frederik Pear, MD;  Location: [place];  Service: Orthopedics;  Laterality: Right;    Social History:  reports that she quit smoking about 49 years ago. Her smoking use included cigarettes. She has never used smokeless tobacco. She reports current alcohol use. She reports that she does not use drugs.   Allergies  Allergen Reactions   Bactrim Swelling    SWELLING OF MOUTH/FACE.   Lisinopril Swelling    SWELLING OF MOUTH/FACE.   Vasotec Swelling    SWELLING OF MOUTH/FACE.   Sulfamethoxazole-Trimethoprim     Other reaction(s): Unknown    Family history reviewed and not pertinent    Prior to Admission medications   Medication Sig Start Date End Date Taking? Authorizing Provider  amLODipine (NORVASC) 10 MG tablet Take 10 mg by mouth daily with breakfast.     [provider]  benzonatate (TESSALON) 100 MG capsule Take 1 capsule (100 mg total) by mouth 3 (three) times daily as needed for cough. 03/19/21   Sherol Dade E, PA-C  diclofenac Sodium (VOLTAREN) 1 %  GEL Apply 2 g topically daily as needed (For topical arthritis.). 03/22/19   [provider]  HUMULIN N 100 UNIT/ML injection Inject 25-30 Units into the skin daily.  11/29/16   [provider]  metFORMIN (GLUCOPHAGE) 500 MG tablet Take 500 mg by mouth daily with breakfast.    [provider]  olmesartan-hydrochlorothiazide (BENICAR HCT) 40-12.5 MG tablet Take 1 tablet by mouth daily.     [provider]  rivaroxaban (XARELTO) 20 MG TABS tablet Take 1 tablet (20 mg total) by mouth daily with supper. 04/19/21   Benay Pike, MD  SM IRON 325 (65 Fe) MG tablet Take 325 mg by mouth daily. 07/22/19   [provider]  TYLENOL 325 MG tablet Take 325 mg by mouth daily. 10/26/20   [provider]     Objective    Physical Exam: Vitals:   04/27/21 1345 04/27/21 1400 04/27/21 1500 04/27/21 1607  BP: (!) 157/79 (!) 146/73 (!) 153/73 (!) 180/75  Pulse:  91 89 92  Resp: (!) 30 20 (!) 22 19  Temp:      SpO2:  98% 99% 100%  Weight:      Height:        General: appears to be stated age; alert, oriented Skin: warm, dry, no rash Head:  AT/[place] Mouth:  Oral mucosa membranes appear dry, normal dentition Neck: supple; trachea midline Heart:  RRR; did not appreciate any M/R/G Lungs: CTAB, did not appreciate any wheezes, rales, or rhonchi Abdomen: Hypoactive bowel sounds noted; soft, ND, mild diffuse tenderness to palpation, in the absence of associated guarding, rigidity, or rebound tenderness. Vascular: 2+ pedal pulses b/l; 2+ radial pulses b/l Extremities: no peripheral edema, no muscle wasting Neuro: strength and sensation intact in upper and lower extremities b/l   Labs on Admission: I have personally reviewed following labs and imaging studies  CBC: Recent Labs  Lab 04/27/21 1336  WBC 8.6  HGB 13.1  HCT 41.4  MCV 77.5*  PLT 878   Basic Metabolic Panel: Recent Labs  Lab 04/27/21 1336  NA 134*  K 3.5  CL 97*  CO2 26  GLUCOSE 206*  BUN 14  CREATININE 0.57  CALCIUM 10.0   GFR: Estimated Creatinine Clearance: 59.8 mL/min (by C-G formula based on SCr of 0.57 mg/dL). Liver Function Tests: Recent Labs  Lab 04/27/21 1336  AST 14*  ALT 7  ALKPHOS 58  BILITOT 0.7  PROT 8.0  ALBUMIN 4.5   Recent Labs  Lab 04/27/21 1336  LIPASE <10*   No results for input(s): AMMONIA in the last 168 hours. Coagulation Profile: No results for  input(s): INR, PROTIME in the last 168 hours. Cardiac Enzymes: No results for input(s): CKTOTAL, CKMB, CKMBINDEX, TROPONINI in the last 168 hours. BNP (last 3 results) No results for input(s): PROBNP in the last 8760 hours. HbA1C: No results for input(s): HGBA1C in the last 72 hours. CBG: Recent Labs  Lab 04/27/21 1104 04/27/21 1727  GLUCAP 236* 130*   Lipid Profile: No results for input(s): CHOL, HDL, LDLCALC, TRIG, CHOLHDL, LDLDIRECT in the last 72 hours. Thyroid Function Tests: No results for input(s): TSH, T4TOTAL, FREET4, T3FREE, THYROIDAB in the last 72 hours. Anemia Panel: No results for input(s): VITAMINB12, FOLATE, FERRITIN, TIBC, IRON, RETICCTPCT in the last 72 hours. Urine analysis:    Component Value Date/Time   COLORURINE YELLOW 04/27/2021 1336   APPEARANCEUR CLEAR 04/27/2021 1336   LABSPEC 1.013 04/27/2021 1336   PHURINE 7.0 04/27/2021 1336   GLUCOSEU >  1,000 (A) 04/27/2021 1336   HGBUR SMALL (A) 04/27/2021 1336   BILIRUBINUR NEGATIVE 04/27/2021 1336   KETONESUR 40 (A) 04/27/2021 1336   PROTEINUR 100 (A) 04/27/2021 1336   UROBILINOGEN 0.2 04/26/2010 0840   NITRITE NEGATIVE 04/27/2021 1336   LEUKOCYTESUR NEGATIVE 04/27/2021 1336    Radiological Exams on Admission: CT ABDOMEN PELVIS W CONTRAST  Result Date: 04/27/2021 CLINICAL DATA:  Abdominal pain, acute, nonlocalized. Nausea and vomiting EXAM: CT ABDOMEN AND PELVIS WITH CONTRAST TECHNIQUE: Multidetector CT imaging of the abdomen and pelvis was performed using the standard protocol following bolus administration of intravenous contrast. RADIATION DOSE REDUCTION: This exam was performed according to the departmental dose-optimization program which includes automated exposure control, adjustment of the mA and/or kV according to patient size and/or use of iterative reconstruction technique. CONTRAST:  193mL OMNIPAQUE IOHEXOL 300 MG/ML  SOLN COMPARISON:  06/16/2012 FINDINGS: Lower chest: Cardiomegaly.  Included lung  bases are clear. Hepatobiliary: No focal liver abnormality. There are a few small layering stones within the gallbladder lumen. No pericholecystic inflammatory changes by CT. No biliary dilatation. Pancreas: Unremarkable. No pancreatic ductal dilatation or surrounding inflammatory changes. Spleen: Normal in size without focal abnormality. Adrenals/Urinary Tract: Adrenal glands within normal limits. Multiple subcentimeter low-density lesions within both kidneys, too small to definitively characterized. No renal stone or hydronephrosis. Urinary bladder within normal limits for the degree of distension. Stomach/Bowel: Small hiatal hernia. Stomach otherwise within normal limits. Small-bowel obstruction with transition point located within patient's inferior ventral abdominal wall hernia (series 5, image 84). Fecalization of small bowel content upstream from the site of obstruction. The small bowel and colon distal to the site of obstruction is decompressed. Extensive diverticulosis, most pronounced within the sigmoid colon Vascular/Lymphatic: Aortic atherosclerosis. No enlarged abdominal or pelvic lymph nodes. Reproductive: Status post hysterectomy. No adnexal masses. Other: Inferior ventral abdominal wall hernia with scarring along the anteroinferior margin of the hernia sac. No ascites. No organized abdominopelvic fluid collection. No pneumoperitoneum. Musculoskeletal: Partially visualized right hip arthroplasty. Advanced degenerative changes of the pubic symphysis with small joint effusion, nonspecific. Advanced degenerative disc disease and facet arthropathy of the lumbar spine. Multilevel bridging endplate osteophytes within the lower thoracic spine. IMPRESSION: 1. Small-bowel obstruction with transition point located within patient's inferior ventral abdominal wall hernia. 2. Extensive colonic diverticulosis without evidence of acute diverticulitis. 3. Cholelithiasis without evidence of acute cholecystitis. Aortic  Atherosclerosis (ICD10-I70.0). Electronically Signed   By: Davina Poke D.O.   On: 04/27/2021 15:22     EKG: Independently reviewed, with result as described above.    Assessment/Plan   Principal Problem:   SBO (small bowel obstruction) (HCC) Active Problems:   Essential hypertension   Type 2 diabetes mellitus without complication (HCC)   Abdominal pain   Nausea   Pulmonary embolism (HCC)    #) Small bowel obstruction: dx on the basis of acute onset abdominal pain associated with nausea, diminished flatus production, with CT abdomen/pelvis, suggestive of SBO with transition point in patient's inferior ventral abdominal wall hernia without clinical/radiographic evidence of associated strangulation, and in the absence of any evidence of bowel perforation or abscess.  In the context of a history of multiple prior abdominal surgeries, most likely etiology stems from postoperative adhesions. No evidence of peritoneal signs on initial physical exam. Will initiate conservative measures, and observe for return of normal bowel function, as described below.   case and imaging were discussed with the on-call general surgeon, Dr.Burke Grandville Silos, who recommended admission to the hospitalist service for further evaluation  and management of presenting SBO, with plan for general surgery consult, as above.    Plan: Strict NPO. LR @ 100 cc per hour. Prn IV fentanyl. Prn IV Zofran. Recheck CMP and CBC in the morning. Check INR. General surgery consulted, as above.       #) Recently diagnosed acute pulmonary embolism: Diagnosis on 04/18/2021 followed by initiation of Xarelto, on 04/19/21.  At the time of today's presentation, the patient appears asymptomatic as it relates to recently diagnosed acute PE, and appears hemodynamically stable, without evidence of acute respiratory distress.  Most recent dose of Xarelto was taken on 04/26/2021.  In the setting of current n.p.o. status is management for presenting  small bowel striction, will hold home Xarelto, and consult inpatient pharmacy for initiation of heparin drip without preceding bolus.   Plan: Hold Xarelto, as above.  Pharmacist consulted for initiation of heparin drip.  Repeat CBC in the morning.         #) Type 2 Diabetes Mellitus: documented history of such. Home insulin regimen: Humulin 25 units subcu daily. Home oral hypoglycemic agents: Metformin. presenting blood sugar: 206, without evidence of anion gap metabolic acidosis to suggest DKA.  In the setting of current n.p.o. status is management for SBO, will hold home Humulin for now, we will closely monitoring every 6 hours Accu-Cheks for determination of necessity of initiation of a relatively low dose of basal insulin.   Plan: accuchecks every 6 hours with low dose SSI. hold home oral hypoglycemic agents during this hospitalization.  Additionally, holding home hemoglobin for now, as above        #) Essential Hypertension: documented h/o such, with outpatient antihypertensive regimen including losartan, HCTZ, Norvasc.  SBP's in the ED today: 140s to 170s mmHg, most recently in the 150s mmHg.   Plan: Close monitoring of subsequent BP via routine VS. holding home and hypertensive medications for now the setting of current n.p.o. status, as above.       DVT prophylaxis: SCD's + heparin drip, as above Code Status: Full code Family Communication: none Disposition Plan: Per Rounding Team Consults called: Dr. Georganna Skeans of General surgery consulted, as further detailed above;  Admission status: Inpatient; MedSurg  Warrants inpatient status on basis of requiring further evaluation and management of presenting small bowel obstruction with transition point, including need for prn IV symptomatic management, including that of pain control as well as residual nausea in the setting of corresponding plan for n.p.o. status.    PLEASE NOTE THAT DRAGON DICTATION SOFTWARE WAS USED IN  THE CONSTRUCTION OF THIS NOTE.   [place] DO [facility]  From [place]   04/27/2021, 8:08 PM

## 2021-04-27 NOTE — ED Provider Notes (Signed)
[facility] EMERGENCY DEPT Provider Note   CSN: [id number] Arrival date & time: 04/27/21  1007     History  Chief Complaint  Patient presents with   Abdominal Pain    Tami Schmidt is a 88 y.o. female.  Patient is an 88 year old female with a history of diabetes, hypertension who is presenting today with complaint of abdominal pain that she reports started last night after she had dinner.  She has had nausea vomiting and pain in her periumbilical region since that time.  She has had several bowel movements but reports they have been normal in consistency.  She denies prior history of similar symptoms.  She reports she does not have regular abdominal pain.  She has not had a fever that she is aware of.  She does not know anybody else who has been ill.  She has had her appendix removed and hysterectomy but denies any other abdominal surgeries.  She reports since arriving here her abdominal pain has improved from what it had been at home.  The history is provided by the patient and medical records.  Abdominal Pain     Home Medications Prior to Admission medications   Medication Sig Start Date End Date Taking? Authorizing Provider  amLODipine (NORVASC) 10 MG tablet Take 10 mg by mouth daily with breakfast.     [provider]  benzonatate (TESSALON) 100 MG capsule Take 1 capsule (100 mg total) by mouth 3 (three) times daily as needed for cough. 03/19/21   Sherol Dade E, PA-C  diclofenac Sodium (VOLTAREN) 1 % GEL Apply 2 g topically daily as needed (For topical arthritis.). 03/22/19   [provider]  HUMULIN N 100 UNIT/ML injection Inject 25-30 Units into the skin daily.  11/29/16   [provider]  metFORMIN (GLUCOPHAGE) 500 MG tablet Take 500 mg by mouth daily with breakfast.    [provider]  olmesartan-hydrochlorothiazide (BENICAR HCT) 40-12.5 MG tablet Take 1 tablet by mouth daily.    [provider]  rivaroxaban  (XARELTO) 20 MG TABS tablet Take 1 tablet (20 mg total) by mouth daily with supper. 04/19/21   Benay Pike, MD  SM IRON 325 (65 Fe) MG tablet Take 325 mg by mouth daily. 07/22/19   [provider]  TYLENOL 325 MG tablet Take 325 mg by mouth daily. 10/26/20   [provider]      Allergies    Bactrim, Lisinopril, Vasotec, and Sulfamethoxazole-trimethoprim    Review of Systems   Review of Systems  Gastrointestinal:  Positive for abdominal pain.   Physical Exam Updated Vital Signs BP (!) 153/73    Pulse 89    Temp 97.8 F (36.6 C)    Resp (!) 22    Ht 5\' 7"  (1.702 m)    Wt 95.3 kg    SpO2 99%    BMI 32.89 kg/m  Physical Exam Vitals and nursing note reviewed.  Constitutional:      General: She is not in acute distress.    Appearance: She is well-developed.  HENT:     Head: Normocephalic and atraumatic.  Eyes:     Pupils: Pupils are equal, round, and reactive to light.  Cardiovascular:     Rate and Rhythm: Normal rate and regular rhythm.     Heart sounds: Normal heart sounds. No murmur heard.   No friction rub.  Pulmonary:     Effort: Pulmonary effort is normal.     Breath sounds: Normal breath sounds. No  wheezing or rales.  Abdominal:     General: Bowel sounds are normal. There is no distension.     Palpations: Abdomen is soft.     Tenderness: There is abdominal tenderness in the periumbilical area. There is no guarding or rebound.     Hernia: No hernia is present.  Musculoskeletal:        General: No tenderness. Normal range of motion.     Cervical back: Normal range of motion and neck supple.     Comments: No edema  Skin:    General: Skin is warm and dry.     Findings: No rash.  Neurological:     Mental Status: She is alert and oriented to person, place, and time. Mental status is at baseline.     Cranial Nerves: No cranial nerve deficit.  Psychiatric:        Mood and Affect: Mood normal.        Behavior: Behavior normal.    ED Results / Procedures  / Treatments   Labs (all labs ordered are listed, but only abnormal results are displayed) Labs Reviewed  LIPASE, BLOOD - Abnormal; Notable for the following components:      Result Value   Lipase <10 (*)    All other components within normal limits  COMPREHENSIVE METABOLIC PANEL - Abnormal; Notable for the following components:   Sodium 134 (*)    Chloride 97 (*)    Glucose, Bld 206 (*)    AST 14 (*)    All other components within normal limits  CBC - Abnormal; Notable for the following components:   RBC 5.34 (*)    MCV 77.5 (*)    MCH 24.5 (*)    RDW 16.2 (*)    All other components within normal limits  URINALYSIS, ROUTINE W REFLEX MICROSCOPIC - Abnormal; Notable for the following components:   Glucose, UA >1,000 (*)    Hgb urine dipstick SMALL (*)    Ketones, ur 40 (*)    Protein, ur 100 (*)    Bacteria, UA RARE (*)    All other components within normal limits  CBG MONITORING, ED - Abnormal; Notable for the following components:   Glucose-Capillary 236 (*)    All other components within normal limits    EKG EKG Interpretation  Date/Time:  Saturday April 27 2021 12:26:54 EST Ventricular Rate:  94 PR Interval:  205 QRS Duration: 100 QT Interval:  366 QTC Calculation: 458 R Axis:   23 Text Interpretation: Sinus arrhythmia Left ventricular hypertrophy Confirmed by Blanchie Dessert [id number] on 04/27/2021 1:32:42 PM  Radiology CT ABDOMEN PELVIS W CONTRAST  Result Date: 04/27/2021 CLINICAL DATA:  Abdominal pain, acute, nonlocalized. Nausea and vomiting EXAM: CT ABDOMEN AND PELVIS WITH CONTRAST TECHNIQUE: Multidetector CT imaging of the abdomen and pelvis was performed using the standard protocol following bolus administration of intravenous contrast. RADIATION DOSE REDUCTION: This exam was performed according to the departmental dose-optimization program which includes automated exposure control, adjustment of the mA and/or kV according to patient size and/or use of  iterative reconstruction technique. CONTRAST:  113mL OMNIPAQUE IOHEXOL 300 MG/ML  SOLN COMPARISON:  06/16/2012 FINDINGS: Lower chest: Cardiomegaly.  Included lung bases are clear. Hepatobiliary: No focal liver abnormality. There are a few small layering stones within the gallbladder lumen. No pericholecystic inflammatory changes by CT. No biliary dilatation. Pancreas: Unremarkable. No pancreatic ductal dilatation or surrounding inflammatory changes. Spleen: Normal in size without focal abnormality. Adrenals/Urinary Tract: Adrenal glands within normal limits. Multiple subcentimeter  low-density lesions within both kidneys, too small to definitively characterized. No renal stone or hydronephrosis. Urinary bladder within normal limits for the degree of distension. Stomach/Bowel: Small hiatal hernia. Stomach otherwise within normal limits. Small-bowel obstruction with transition point located within patient's inferior ventral abdominal wall hernia (series 5, image 84). Fecalization of small bowel content upstream from the site of obstruction. The small bowel and colon distal to the site of obstruction is decompressed. Extensive diverticulosis, most pronounced within the sigmoid colon Vascular/Lymphatic: Aortic atherosclerosis. No enlarged abdominal or pelvic lymph nodes. Reproductive: Status post hysterectomy. No adnexal masses. Other: Inferior ventral abdominal wall hernia with scarring along the anteroinferior margin of the hernia sac. No ascites. No organized abdominopelvic fluid collection. No pneumoperitoneum. Musculoskeletal: Partially visualized right hip arthroplasty. Advanced degenerative changes of the pubic symphysis with small joint effusion, nonspecific. Advanced degenerative disc disease and facet arthropathy of the lumbar spine. Multilevel bridging endplate osteophytes within the lower thoracic spine. IMPRESSION: 1. Small-bowel obstruction with transition point located within patient's inferior ventral  abdominal wall hernia. 2. Extensive colonic diverticulosis without evidence of acute diverticulitis. 3. Cholelithiasis without evidence of acute cholecystitis. Aortic Atherosclerosis (ICD10-I70.0). Electronically Signed   By: Davina Poke D.O.   On: 04/27/2021 15:22    Procedures Procedures    Medications Ordered in ED Medications  lactated ringers infusion ( Intravenous New Bag/Given 04/27/21 1353)  fentaNYL (SUBLIMAZE) injection 50 mcg (has no administration in time range)  ondansetron (ZOFRAN) injection 4 mg (has no administration in time range)  iohexol (OMNIPAQUE) 300 MG/ML solution 100 mL (100 mLs Intravenous Contrast Given 04/27/21 1440)    ED Course/ Medical Decision Making/ A&P                           Medical Decision Making Amount and/or Complexity of Data Reviewed External Data Reviewed: notes. Labs: ordered. Decision-making details documented in ED Course. Radiology: ordered and independent interpretation performed. Decision-making details documented in ED Course. ECG/medicine tests: ordered and independent interpretation performed. Decision-making details documented in ED Course.   Patient is an 88 year old female presenting today with abdominal pain that is been ongoing now for 12 hours.  She has had vomiting associated with this but no other additional symptoms.  On exam she reports the pain is improved but she still has some periumbilical tenderness.  Concern for possible partial obstruction, ileus, atypical diverticulitis.  Possibility for hernia that is not easily visualized on exam.  Patient does have a history of breast cancer and is currently on Xarelto she is getting chemotherapy every 21 days.  We will give IV fluids but patient currently is not requesting anything for pain or nausea.  Labs pending and will do imaging to ensure no acute findings.  4:16 PM I independently interpreted patient's labs today which show a normal lipase, CMP without acute findings.   She is hyperglycemic but that appears to be baseline. EKG today without acute findings. CBC today within normal limits and UA without acute findings.  I independently evaluated radiology read and interpreted it as patient has a small bowel obstruction with a transition point located in the patient's inferior ventral abdominal wall hernia.  No evidence of acute diverticulitis at this time.  On repeat evaluation patient has had no vomiting but continues to have abdominal pain and is now requesting pain medication.  General surgery to weigh in on patient.  No NG tube placed at this time because she is not vomiting.  The  findings were discussed with the patient and her family member.  Dr. Langston Masker will talk with general surgery and continue care.        Final Clinical Impression(s) / ED Diagnoses Final diagnoses:  Small bowel obstruction [facility])    Rx / DC Orders ED Discharge Orders     None         Blanchie Dessert, MD 04/27/21 [phone]

## 2021-04-27 NOTE — ED Triage Notes (Signed)
Pt presents with abd pain and N/V that started after dinner last PM. Pt ate at home and no one else has any symptoms.

## 2021-04-27 NOTE — ED Notes (Signed)
Pt family member expressed concern about pt's blood sugar as she hasn't eaten in many hours.  CBG ordered and PO status reviewed.

## 2021-04-27 NOTE — Consult Note (Signed)
Tami Schmidt Oct 13, 1932  [id number].    Chief Complaint/Reason for Consult: ventral hernia, small bowel obstruction  HPI:  Tami Schmidt is an 88 yo female who presented to the ED today with abdominal pain, nausea and vomiting. Her symptoms began last night. She last had a bowel movement 2 days ago, but has not had any flatus or bowel movements today. Her prior abdominal surgeries are an appendectomy and hysterectomy. Labs are unremarkable and she is afebrile and hemodynamically stable. A CT scan was done at [facility], which showed a small bowel obstruction, with a transition within a ventral hernia. The patient was admitted to [facility] and general surgery was consulted.  She has a history of recurrent breast cancer, and is to begin treatment with trastuzumab. She recently developed an acute PE in Nov 2022 for which she is on Xarelto (her last dose was yesterday evening).  ROS: Review of Systems  Constitutional:  Negative for chills and fever.  Respiratory:  Negative for shortness of breath and wheezing.   Gastrointestinal:  Positive for abdominal pain, nausea and vomiting.  Neurological:  Negative for loss of consciousness and weakness.   History reviewed. No pertinent family history.  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)    Type II or unspecified type diabetes mellitus without mention of complication, not stated as uncontrolled 03/25/2013    Past Surgical History:  Procedure Laterality Date   ABDOMINAL HYSTERECTOMY     ANKLE SURGERY Right 1995   APPENDECTOMY     BIOPSY  01/07/2018   Procedure: BIOPSY;  Surgeon: [name],  MD;  Location: [facility];  Service: Gastroenterology;;   BREAST SURGERY Bilateral    mastectomy   COLONOSCOPY WITH PROPOFOL N/A 01/07/2018   Procedure: COLONOSCOPY WITH PROPOFOL;  Surgeon: [name], MD;  Location: [facility];  Service: Gastroenterology;  Laterality: N/A;   ESOPHAGOGASTRODUODENOSCOPY (EGD) WITH PROPOFOL N/A 01/06/2018   Procedure: ESOPHAGOGASTRODUODENOSCOPY (EGD) WITH PROPOFOL;  Surgeon: [name], MD;  Location: [facility];  Service: Gastroenterology;  Laterality: N/A;   HERNIA REPAIR  04-26-2010   MASS EXCISION Left 01/26/2013   Procedure: EXCISION LEFT CHEST WALL MASS AND LEFT ABDOMNAL WALL MASS;  Surgeon: Harl Bowie, MD;  Location: [place];  Service: General;  Laterality: Left;   MASS EXCISION Left 09/20/2014   Procedure: EXCISION OF LEFT CHEST WALL MASS;  Surgeon: Coralie Keens, MD;  Location: [place];  Service: General;  Laterality: Left;   TEE WITHOUT CARDIOVERSION N/A 12/08/2017   Procedure: TRANSESOPHAGEAL ECHOCARDIOGRAM (TEE);  Surgeon: Lelon Perla, MD;  Location: [place];  Service: Cardiovascular;  Laterality: N/A;   TOTAL HIP ARTHROPLASTY Right 01/05/2017   TOTAL HIP ARTHROPLASTY Right 01/05/2017   Procedure: TOTAL HIP ARTHROPLASTY ANTERIOR APPROACH;  Surgeon: Frederik Pear, MD;  Location: [place];  Service: Orthopedics;  Laterality: Right;    Social History:  reports that she quit smoking about 49 years ago. Her smoking use included cigarettes. She has never used smokeless tobacco. She reports current alcohol use. She reports that  she does not use drugs.  Allergies:  Allergies  Allergen Reactions   Bactrim Swelling    SWELLING OF MOUTH/FACE.   Lisinopril Swelling    SWELLING OF MOUTH/FACE.   Vasotec Swelling    SWELLING OF MOUTH/FACE.   Sulfamethoxazole-Trimethoprim     Other reaction(s): Unknown    Medications Prior to Admission  Medication Sig Dispense Refill   amLODipine (NORVASC) 10 MG tablet Take 10 mg by  mouth daily with breakfast.      benzonatate (TESSALON) 100 MG capsule Take 1 capsule (100 mg total) by mouth 3 (three) times daily as needed for cough. 20 capsule 0   diclofenac Sodium (VOLTAREN) 1 % GEL Apply 2 g topically daily as needed (For topical arthritis.).     HUMULIN N 100 UNIT/ML injection Inject 25-30 Units into the skin daily.      metFORMIN (GLUCOPHAGE) 500 MG tablet Take 500 mg by mouth daily with breakfast.     olmesartan-hydrochlorothiazide (BENICAR HCT) 40-12.5 MG tablet Take 1 tablet by mouth daily.     rivaroxaban (XARELTO) 20 MG TABS tablet Take 1 tablet (20 mg total) by mouth daily with supper. 90 tablet 3   SM IRON 325 (65 Fe) MG tablet Take 325 mg by mouth daily.     TYLENOL 325 MG tablet Take 325 mg by mouth daily.       Physical Exam: Blood pressure (!) 158/80, pulse 89, temperature 97.8 F (36.6 C), resp. rate 17, height 5\' 7"  (1.702 m), weight 95.3 kg, SpO2 96 %. General: resting comfortably, no apparent distress Neurological: alert and oriented, no focal deficits, cranial nerves grossly in tact HEENT: normocephalic, atraumatic, oropharynx clear, no scleral icterus, NG clamped CV: extremities warm and well-perfused Respiratory: normal work of breathing on room air, symmetric chest wall expansion Abdomen: soft, nondistended, nontender to deep palpation. No masses or organomegaly. Well-healed midline surgical scar, underlying hernia is difficult to appreciate on exam but there is no tenderness to deep palpation or overlying skin changes. Extremities: warm and well-perfused, no deformities, moving all extremities spontaneously Psychiatric: normal mood and affect Skin: warm and dry, no jaundice, no rashes or lesions   Results for orders placed or performed during the hospital encounter of 04/27/21 (from the past 48 hour(s))  CBG monitoring, ED     Status: Abnormal   Collection Time: 04/27/21 11:04 AM  Result Value Ref Range   Glucose-Capillary 236 (H) 70 - 99  mg/dL    Comment: Glucose reference range applies only to samples taken after fasting for at least 8 hours.  Lipase, blood     Status: Abnormal   Collection Time: 04/27/21  1:36 PM  Result Value Ref Range   Lipase <10 (L) 11 - 51 U/L    Comment: Performed at [facility], [address] [place], [place] [address]  Comprehensive metabolic panel     Status: Abnormal   Collection Time: 04/27/21  1:36 PM  Result Value Ref Range   Sodium 134 (L) 135 - 145 mmol/L   Potassium 3.5 3.5 - 5.1 mmol/L   Chloride 97 (L) 98 - 111 mmol/L   CO2 26 22 - 32 mmol/L   Glucose, Bld 206 (H) 70 - 99 mg/dL    Comment: Glucose reference range applies only to samples taken after fasting for at least 8 hours.   BUN 14 8 - 23 mg/dL   Creatinine, Ser 0.57 0.44 - 1.00 mg/dL   Calcium 10.0 8.9 - 10.3 mg/dL   Total Protein  8.0 6.5 - 8.1 g/dL   Albumin 4.5 3.5 - 5.0 g/dL   AST 14 (L) 15 - 41 U/L   ALT 7 0 - 44 U/L   Alkaline Phosphatase 58 38 - 126 U/L   Total Bilirubin 0.7 0.3 - 1.2 mg/dL   GFR, Estimated >60 >60 mL/min    Comment: (NOTE) Calculated using the CKD-EPI Creatinine Equation (2021)    Anion gap 11 5 - 15    Comment: Performed at [facility], [address] [place], [place] [address]  CBC     Status: Abnormal   Collection Time: 04/27/21  1:36 PM  Result Value Ref Range   WBC 8.6 4.0 - 10.5 K/uL   RBC 5.34 (H) 3.87 - 5.11 MIL/uL   Hemoglobin 13.1 12.0 - 15.0 g/dL   HCT 41.4 36.0 - 46.0 %   MCV 77.5 (L) 80.0 - 100.0 fL   MCH 24.5 (L) 26.0 - 34.0 pg   MCHC 31.6 30.0 - 36.0 g/dL   RDW 16.2 (H) 11.5 - 15.5 %   Platelets 273 150 - 400 K/uL   nRBC 0.0 0.0 - 0.2 %    Comment: Performed at [facility], [place], [place] [address]  Urinalysis, Routine w reflex microscopic Urine, Clean Catch     Status: Abnormal   Collection Time: 04/27/21  1:36 PM  Result Value Ref Range   Color, Urine YELLOW YELLOW   APPearance CLEAR  CLEAR   Specific Gravity, Urine 1.013 1.005 - 1.030   pH 7.0 5.0 - 8.0   Glucose, UA >1,000 (A) NEGATIVE mg/dL   Hgb urine dipstick SMALL (A) NEGATIVE   Bilirubin Urine NEGATIVE NEGATIVE   Ketones, ur 40 (A) NEGATIVE mg/dL   Protein, ur 100 (A) NEGATIVE mg/dL   Nitrite NEGATIVE NEGATIVE   Leukocytes,Ua NEGATIVE NEGATIVE   RBC / HPF 11-20 0 - 5 RBC/hpf   Bacteria, UA RARE (A) NONE SEEN   Squamous Epithelial / LPF 0-5 0 - 5   Mucus PRESENT     Comment: Performed at [facility], [address], [place], [place] [address]  POC CBG, ED     Status: Abnormal   Collection Time: 04/27/21  5:27 PM  Result Value Ref Range   Glucose-Capillary 130 (H) 70 - 99 mg/dL    Comment: Glucose reference range applies only to samples taken after fasting for at least 8 hours.  Resp Panel by RT-PCR (Flu A&B, Covid) Nasopharyngeal Swab     Status: None   Collection Time: 04/27/21  5:31 PM   Specimen: Nasopharyngeal Swab; Nasopharyngeal(NP) swabs in vial transport medium  Result Value Ref Range   SARS Coronavirus 2 by RT PCR NEGATIVE NEGATIVE    Comment: (NOTE) SARS-CoV-2 target nucleic acids are NOT DETECTED.  The SARS-CoV-2 RNA is generally detectable in upper respiratory specimens during the acute phase of infection. The lowest concentration of SARS-CoV-2 viral copies this assay can detect is 138 copies/mL. A negative result does not preclude SARS-Cov-2 infection and should not be used as the sole basis for treatment or other patient management decisions. A negative result may occur with  improper specimen collection/handling, submission of specimen other than nasopharyngeal swab, presence of viral mutation(s) within the areas targeted by this assay, and inadequate number of viral copies(<138 copies/mL). A negative result must be combined with clinical observations, patient history, and epidemiological information. The expected result is Negative.  Fact Sheet for Patients:   EntrepreneurPulse.com.au  Fact Sheet for  Healthcare Providers:  IncredibleEmployment.be  This test is no t yet approved or cleared by the [place] and  has been authorized for detection and/or diagnosis of SARS-CoV-2 by FDA under an Emergency Use Authorization (EUA). This EUA will remain  in effect (meaning this test can be used) for the duration of the COVID-19 declaration under Section 564(b)(1) of the Act, 21 U.S.C.section 360bbb-3(b)(1), unless the authorization is terminated  or revoked sooner.       Influenza A by PCR NEGATIVE NEGATIVE   Influenza B by PCR NEGATIVE NEGATIVE    Comment: (NOTE) The Xpert Xpress SARS-CoV-2/FLU/RSV plus assay is intended as an aid in the diagnosis of influenza from Nasopharyngeal swab specimens and should not be used as a sole basis for treatment. Nasal washings and aspirates are unacceptable for Xpert Xpress SARS-CoV-2/FLU/RSV testing.  Fact Sheet for Patients: EntrepreneurPulse.com.au  Fact Sheet for Healthcare Providers: IncredibleEmployment.be  This test is not yet approved or cleared by the [place] FDA and has been authorized for detection and/or diagnosis of SARS-CoV-2 by FDA under an Emergency Use Authorization (EUA). This EUA will remain in effect (meaning this test can be used) for the duration of the COVID-19 declaration under Section 564(b)(1) of the Act, 21 U.S.C. section 360bbb-3(b)(1), unless the authorization is terminated or revoked.  Performed at [facility], [address] [place], [place] [address]    CT ABDOMEN PELVIS W CONTRAST  Result Date: 04/27/2021 CLINICAL DATA:  Abdominal pain, acute, nonlocalized. Nausea and vomiting EXAM: CT ABDOMEN AND PELVIS WITH CONTRAST TECHNIQUE: Multidetector CT imaging of the abdomen and pelvis was performed using the standard protocol following bolus administration of  intravenous contrast. RADIATION DOSE REDUCTION: This exam was performed according to the departmental dose-optimization program which includes automated exposure control, adjustment of the mA and/or kV according to patient size and/or use of iterative reconstruction technique. CONTRAST:  138mL OMNIPAQUE IOHEXOL 300 MG/ML  SOLN COMPARISON:  06/16/2012 FINDINGS: Lower chest: Cardiomegaly.  Included lung bases are clear. Hepatobiliary: No focal liver abnormality. There are a few small layering stones within the gallbladder lumen. No pericholecystic inflammatory changes by CT. No biliary dilatation. Pancreas: Unremarkable. No pancreatic ductal dilatation or surrounding inflammatory changes. Spleen: Normal in size without focal abnormality. Adrenals/Urinary Tract: Adrenal glands within normal limits. Multiple subcentimeter low-density lesions within both kidneys, too small to definitively characterized. No renal stone or hydronephrosis. Urinary bladder within normal limits for the degree of distension. Stomach/Bowel: Small hiatal hernia. Stomach otherwise within normal limits. Small-bowel obstruction with transition point located within patient's inferior ventral abdominal wall hernia (series 5, image 84). Fecalization of small bowel content upstream from the site of obstruction. The small bowel and colon distal to the site of obstruction is decompressed. Extensive diverticulosis, most pronounced within the sigmoid colon Vascular/Lymphatic: Aortic atherosclerosis. No enlarged abdominal or pelvic lymph nodes. Reproductive: Status post hysterectomy. No adnexal masses. Other: Inferior ventral abdominal wall hernia with scarring along the anteroinferior margin of the hernia sac. No ascites. No organized abdominopelvic fluid collection. No pneumoperitoneum. Musculoskeletal: Partially visualized right hip arthroplasty. Advanced degenerative changes of the pubic symphysis with small joint effusion, nonspecific. Advanced  degenerative disc disease and facet arthropathy of the lumbar spine. Multilevel bridging endplate osteophytes within the lower thoracic spine. IMPRESSION: 1. Small-bowel obstruction with transition point located within patient's inferior ventral abdominal wall hernia. 2. Extensive colonic diverticulosis without evidence of acute diverticulitis. 3. Cholelithiasis without evidence of acute cholecystitis. Aortic Atherosclerosis (ICD10-I70.0). Electronically Signed   By: Hart Carwin  Plundo D.O.   On: 04/27/2021 15:22      Assessment/Plan This is an 88 yo female with a recent PE on Xarelto, undergoing treatment for recurrent breast cancer, who presents with a small bowel obstruction.  I reviewed her CT scan, which shows a ventral hernia at the lower midline at the site of her previous surgical scar, containing small bowel with mildly dilated proximal loops of bowel.  On exam she has no tenderness to palpation and she is nontoxic, so my suspicion for bowel strangulation is extremely low.  Proceed with NG decompression and nonoperative management for now.  I discussed that if the patient fails to improve, she may require surgery which would include lysis of adhesions and ventral hernia repair, however we would prefer to do this after she has been off of Xarelto for at least 48 hours, unless she develops signs/symptoms of bowel strangulation or perforation. - Place NG tube to low intermittent suction. XR to confirm placement pending. - NPO, IV fluid hydration - Ok for heparin gtt, hold Xarelto - Surgery will follow closely   Michaelle Birks, [facility], Hepatobiliary and Pancreatic Surgery 04/27/21 10:02 PM

## 2021-04-27 NOTE — ED Provider Notes (Signed)
Pt signed out to me by Dr Maryan Rued.    Briefly 88 yo female presenting with abdominal pain onset last night, hx of appendectomy and hysterectomy, hx of breast cancer, on chemotherapy.  CT shows signs of bowel obstruction  "Small-bowel obstruction with transition point located within patient's inferior ventral abdominal wall hernia (series 5, image 84). Fecalization of small bowel content upstream from the site of obstruction. The small bowel and colon distal to the site of obstruction is decompressed. Extensive diverticulosis, most pronounced within the sigmoid colon."  Pain under control, no vomiting here.    NPO  Patient needs surgery consult, pending consult  Dr Georganna Skeans, gen surgery, agrees with medical admission, surgery consult on arrival   Admitted to Dr Roosevelt Locks hospitalist  Patient stable, minimal pain, asking for food, informed of need for NPO status   Wyvonnia Dusky, MD 04/27/21 1706

## 2021-04-27 NOTE — Plan of Care (Signed)

## 2021-04-27 NOTE — Progress Notes (Addendum)
[place] for heparin Indication:  hx PE  Allergies  Allergen Reactions   Bactrim Swelling    SWELLING OF MOUTH/FACE.   Lisinopril Swelling    SWELLING OF MOUTH/FACE.   Vasotec Swelling    SWELLING OF MOUTH/FACE.   Sulfamethoxazole-Trimethoprim     Other reaction(s): Unknown    Patient Measurements: Height: 5\' 7"  (170.2 cm) Weight: 95.3 kg (210 lb) IBW/kg (Calculated) : 61.6 Heparin Dosing Weight: 82kg  Vital Signs: Temp: 97.8 F (36.6 C) (01/14 1048) BP: 158/80 (01/14 2007) Pulse Rate: 89 (01/14 2007)  Labs: Recent Labs    04/27/21 1336  HGB 13.1  HCT 41.4  PLT 273  CREATININE 0.57    Estimated Creatinine Clearance: 59.8 mL/min (by C-G formula based on SCr of 0.57 mg/dL).   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 Pt has hx PE (02/2021) on Xarelto PTA, pharmacy asked to dose IV heparin while unable to give POs. Last Xarelto dose was Thursday. CBC wnl on admit.  Goal of Therapy:  Heparin level 0.3-0.7 units/ml aPTT 66-102 seconds Monitor platelets by anticoagulation protocol: Yes   Plan:  Heparin 1300 units/h no bolus Check aPTT and heparin level in 8h - will need to dose via APTTs most likely   Arrie Senate, PharmD, BCPS, [facility] Clinical Pharmacist [phone] Please check [facility]ION for all [facility] Pharmacy numbers 04/27/2021

## 2021-04-28 ENCOUNTER — Inpatient Hospital Stay (HOSPITAL_COMMUNITY): Payer: Medicare Other

## 2021-04-28 DIAGNOSIS — E876 Hypokalemia: Secondary | ICD-10-CM | POA: Diagnosis not present

## 2021-04-28 LAB — COMPREHENSIVE METABOLIC PANEL
ALT: 8 U/L (ref 0–44)
AST: 14 U/L — ABNORMAL LOW (ref 15–41)
Albumin: 3.4 g/dL — ABNORMAL LOW (ref 3.5–5.0)
Alkaline Phosphatase: 55 U/L (ref 38–126)
Anion gap: 12 (ref 5–15)
BUN: 11 mg/dL (ref 8–23)
CO2: 26 mmol/L (ref 22–32)
Calcium: 8.7 mg/dL — ABNORMAL LOW (ref 8.9–10.3)
Chloride: 100 mmol/L (ref 98–111)
Creatinine, Ser: 0.66 mg/dL (ref 0.44–1.00)
GFR, Estimated: >60 mL/min (ref >60)
Glucose, Bld: 130 mg/dL — ABNORMAL HIGH (ref 70–99)
Potassium: 3.1 mmol/L — ABNORMAL LOW (ref 3.5–5.1)
Sodium: 138 mmol/L (ref 135–145)
Total Bilirubin: 0.9 mg/dL (ref 0.3–1.2)
Total Protein: 6.2 g/dL — ABNORMAL LOW (ref 6.5–8.1)

## 2021-04-28 LAB — CBC WITH DIFFERENTIAL/PLATELET
Abs Immature Granulocytes: 0.02 10*3/uL (ref 0.00–0.07)
Basophils Absolute: 0.1 10*3/uL (ref 0.0–0.1)
Basophils Relative: 1 %
Eosinophils Absolute: 0.1 10*3/uL (ref 0.0–0.5)
Eosinophils Relative: 2 %
HCT: 39.8 % (ref 36.0–46.0)
Hemoglobin: 12.8 g/dL (ref 12.0–15.0)
Immature Granulocytes: 0 %
Lymphocytes Relative: 21 %
Lymphs Abs: 1.5 10*3/uL (ref 0.7–4.0)
MCH: 25 pg — ABNORMAL LOW (ref 26.0–34.0)
MCHC: 32.2 g/dL (ref 30.0–36.0)
MCV: 77.7 fL — ABNORMAL LOW (ref 80.0–100.0)
Monocytes Absolute: 0.5 10*3/uL (ref 0.1–1.0)
Monocytes Relative: 7 %
Neutro Abs: 5 10*3/uL (ref 1.7–7.7)
Neutrophils Relative %: 69 %
Platelets: 256 10*3/uL (ref 150–400)
RBC: 5.12 MIL/uL — ABNORMAL HIGH (ref 3.87–5.11)
RDW: 16.1 % — ABNORMAL HIGH (ref 11.5–15.5)
WBC: 7.2 10*3/uL (ref 4.0–10.5)
nRBC: 0 % (ref 0.0–0.2)

## 2021-04-28 LAB — MAGNESIUM: Magnesium: 1.9 mg/dL (ref 1.7–2.4)

## 2021-04-28 LAB — GLUCOSE, CAPILLARY
Glucose-Capillary: 120 mg/dL — ABNORMAL HIGH (ref 70–99)
Glucose-Capillary: 121 mg/dL — ABNORMAL HIGH (ref 70–99)
Glucose-Capillary: 137 mg/dL — ABNORMAL HIGH (ref 70–99)

## 2021-04-28 LAB — APTT
aPTT: 130 seconds — ABNORMAL HIGH (ref 24–36)
aPTT: 93 seconds — ABNORMAL HIGH (ref 24–36)

## 2021-04-28 LAB — PROTIME-INR
INR: 1.2 (ref 0.8–1.2)
Prothrombin Time: 15.3 seconds — ABNORMAL HIGH (ref 11.4–15.2)

## 2021-04-28 LAB — HEPARIN LEVEL (UNFRACTIONATED)
Heparin Unfractionated: 1.1 IU/mL — ABNORMAL HIGH (ref 0.30–0.70)
Heparin Unfractionated: 0.91 IU/mL — ABNORMAL HIGH (ref 0.30–0.70)

## 2021-04-28 MED ORDER — PHENOL 1.4 % MT LIQD
1.0000 | OROMUCOSAL | Status: DC | PRN
  Administered 2021-04-28: 1 via OROMUCOSAL
  Filled 2021-04-28: qty 177

## 2021-04-28 MED ORDER — POTASSIUM CHLORIDE 10 MEQ/100ML IV SOLN
10.0000 meq | INTRAVENOUS | Status: AC
  Administered 2021-04-28 – 2021-04-29 (×6): 10 meq via INTRAVENOUS
  Filled 2021-04-28 (×6): qty 100

## 2021-04-28 MED ORDER — DIATRIZOATE MEGLUMINE & SODIUM 66-10 % PO SOLN
90.0000 mL | Freq: Once | ORAL | Status: AC
  Administered 2021-04-28: 90 mL via NASOGASTRIC
  Filled 2021-04-28: qty 90

## 2021-04-28 NOTE — Progress Notes (Signed)
PROGRESS NOTE   Tami Schmidt  QJJ:[id number]    DOB: Feb 13, 1933    DOA: 04/27/2021  PCP: [name], MD   I have briefly reviewed patients previous medical records in [facility].  Chief Complaint  Patient presents with   Abdominal Pain    Brief Narrative:  88 year old female with medical history of breast cancer, s/p bilateral mastectomies s/p radiation and undergoing chemotherapy, recently diagnosed with acute pulmonary embolism (04/18/2021) and now on Xarelto, type II DM, HTN, HLD, hysterectomy, appendectomy, hernia repair presented to ED with complaints of 1 to 2 days history of progressive abdominal pain, nausea, vomiting and reduced oral intake.  BM and flatus 2 days PTA.  Admitted for small bowel obstruction with a transition within the ventral hernia.  General surgery consulted, managing conservatively with n.p.o., NG tube, IVF.  Holding Xarelto.    Assessment & Plan:  SBO (small bowel obstruction) (HCC) Prior surgeries as noted above. CT abdomen showed SBO, with a transition within a ventral hernia. General surgery consultation and follow-up appreciated. Treating conservatively with n.p.o., NG tube decompression, IV fluids. If fails to improve or worsens, may require surgery including LOA and ventral hernia repair but preferred to after 48 hours Xarelto washout. Seem to be clinically improving.  Passing flatus.  Pulmonary embolism ([place]) Diagnosed 04/18/2021. Last dose of Xarelto 04/26/2021.  Xarelto now on hold. Continue IV heparin drip per pharmacy.  Type 2 diabetes mellitus without complication (HCC) Holding home Humulin and metformin. Continue SSI Good inpatient control  Essential hypertension Holding home regimen including losartan, HCTZ and Norvasc. As needed IV hydralazine. Mildly uncontrolled.  Chest wall recurrence of breast cancer [facility]) Outpatient follow-up with oncology, Dr. Chryl Heck  Hypokalemia Replace IV and follow. Magnesium  normal.    Body mass index is 32.11 kg/m.    DVT prophylaxis: SCDs Start: 04/27/21 2016     Code Status: Full Code Family Communication: None at bedside Disposition:  Status is: Inpatient  Remains inpatient appropriate because: Severity of illness        Consultants:   General surgery  Procedures:   NG tube  Antimicrobials:      Subjective:  Feels much better.  Abdominal pain resolved.  Passing some flatus.  No BM yet this morning.  No other complaints reported  Objective:   Vitals:   04/28/21 0344 04/28/21 0600 04/28/21 0637 04/28/21 0727  BP: (!) 159/84  (!) 166/77 (!) 152/74  Pulse: 84  77 82  Resp: 16  14 18   Temp: 98.2 F (36.8 C)  97.8 F (36.6 C) 98.1 F (36.7 C)  TempSrc: Oral   Oral  SpO2: 96%  97% 97%  Weight:  93 kg    Height:        General exam: Elderly female, moderately built and obese lying comfortably propped up in bed without distress.  Oral mucosa moist.  NG tube in place. Respiratory system: Clear to auscultation. Respiratory effort normal. Cardiovascular system: S1 & S2 heard, RRR. No JVD, murmurs, rubs, gallops or clicks. No pedal edema. Gastrointestinal system: Abdomen is nondistended/obese, soft and nontender. No organomegaly or masses felt.  Diminished bowel sounds. Central nervous system: Alert and oriented. No focal neurological deficits. Extremities: Symmetric 5 x 5 power. Skin: No rashes, lesions or ulcers Psychiatry: Judgement and insight appear normal. Mood & affect appropriate.     Data Reviewed:   I have personally reviewed following labs and imaging studies   CBC: Recent Labs  Lab 04/27/21 1336 04/28/21 4818  WBC 8.6 7.2  NEUTROABS  --  5.0  HGB 13.1 12.8  HCT 41.4 39.8  MCV 77.5* 77.7*  PLT 273 174    Basic Metabolic Panel: Recent Labs  Lab 04/27/21 1336 04/28/21 0702  NA 134* 138  K 3.5 3.1*  CL 97* 100  CO2 26 26  GLUCOSE 206* 130*  BUN 14 11  CREATININE 0.57 0.66  CALCIUM 10.0 8.7*  MG   --  1.9    Liver Function Tests: Recent Labs  Lab 04/27/21 1336 04/28/21 0702  AST 14* 14*  ALT 7 8  ALKPHOS 58 55  BILITOT 0.7 0.9  PROT 8.0 6.2*  ALBUMIN 4.5 3.4*    CBG: Recent Labs  Lab 04/28/21 0022 04/28/21 0602 04/28/21 1214  GLUCAP 137* 120* 121*    Microbiology Studies:   Recent Results (from the past 240 hour(s))  Resp Panel by RT-PCR (Flu A&B, Covid) Nasopharyngeal Swab     Status: None   Collection Time: 04/27/21  5:31 PM   Specimen: Nasopharyngeal Swab; Nasopharyngeal(NP) swabs in vial transport medium  Result Value Ref Range Status   SARS Coronavirus 2 by RT PCR NEGATIVE NEGATIVE Final    Comment: (NOTE) SARS-CoV-2 target nucleic acids are NOT DETECTED.  The SARS-CoV-2 RNA is generally detectable in upper respiratory specimens during the acute phase of infection. The lowest concentration of SARS-CoV-2 viral copies this assay can detect is 138 copies/mL. A negative result does not preclude SARS-Cov-2 infection and should not be used as the sole basis for treatment or other patient management decisions. A negative result may occur with  improper specimen collection/handling, submission of specimen other than nasopharyngeal swab, presence of viral mutation(s) within the areas targeted by this assay, and inadequate number of viral copies(<138 copies/mL). A negative result must be combined with clinical observations, patient history, and epidemiological information. The expected result is Negative.  Fact Sheet for Patients:  EntrepreneurPulse.com.au  Fact Sheet for Healthcare Providers:  IncredibleEmployment.be  This test is no t yet approved or cleared by the [place] FDA and  has been authorized for detection and/or diagnosis of SARS-CoV-2 by FDA under an Emergency Use Authorization (EUA). This EUA will remain  in effect (meaning this test can be used) for the duration of the COVID-19 declaration under  Section 564(b)(1) of the Act, 21 U.S.C.section 360bbb-3(b)(1), unless the authorization is terminated  or revoked sooner.       Influenza A by PCR NEGATIVE NEGATIVE Final   Influenza B by PCR NEGATIVE NEGATIVE Final    Comment: (NOTE) The Xpert Xpress SARS-CoV-2/FLU/RSV plus assay is intended as an aid in the diagnosis of influenza from Nasopharyngeal swab specimens and should not be used as a sole basis for treatment. Nasal washings and aspirates are unacceptable for Xpert Xpress SARS-CoV-2/FLU/RSV testing.  Fact Sheet for Patients: EntrepreneurPulse.com.au  Fact Sheet for Healthcare Providers: IncredibleEmployment.be  This test is not yet approved or cleared by the [place] FDA and has been authorized for detection and/or diagnosis of SARS-CoV-2 by FDA under an Emergency Use Authorization (EUA). This EUA will remain in effect (meaning this test can be used) for the duration of the COVID-19 declaration under Section 564(b)(1) of the Act, 21 U.S.C. section 360bbb-3(b)(1), unless the authorization is terminated or revoked.  Performed at [facility], [address] [place], [place] [address]     Radiology Studies:  DG Abd 1 View  Result Date: 04/28/2021 CLINICAL DATA:  Check nasogastric catheter placement EXAM: ABDOMEN - 1 VIEW  COMPARISON:  Film from earlier in the same day. FINDINGS: Gastric catheter has been advanced and now lies within the stomach. No obstructive changes are seen at this time. No free air is noted. IMPRESSION: Gastric catheter now lies within the stomach in appropriate position. Electronically Signed   By: Inez Catalina M.D.   On: 04/28/2021 00:12   DG Abd 1 View  Result Date: 04/27/2021 CLINICAL DATA:  NGT placement. EXAM: ABDOMEN - 1 VIEW COMPARISON:  CT with IV contrast earlier today. FINDINGS: NGT is in place with the tip just below the hiatus and the side hole in the distal esophagus and needs  to be advanced further in 12-15 cm for better positioning. Imaging includes the upper to mid abdomen excluding the true pelvis. The bowel pattern again demonstrating evidence of small-bowel obstruction with small bowel dilatation up to 3.6 cm. There is no supine evidence of free air. Visceral shadows are stable; no appreciable pathologic calcification. Cardiomegaly. IMPRESSION: 1. Enteric tube should be advanced further in 12-15 cm. 2. Small bowel dilatation is unchanged. Electronically Signed   By: Telford Nab M.D.   On: 04/27/2021 22:57   CT ABDOMEN PELVIS W CONTRAST  Result Date: 04/27/2021 CLINICAL DATA:  Abdominal pain, acute, nonlocalized. Nausea and vomiting EXAM: CT ABDOMEN AND PELVIS WITH CONTRAST TECHNIQUE: Multidetector CT imaging of the abdomen and pelvis was performed using the standard protocol following bolus administration of intravenous contrast. RADIATION DOSE REDUCTION: This exam was performed according to the departmental dose-optimization program which includes automated exposure control, adjustment of the mA and/or kV according to patient size and/or use of iterative reconstruction technique. CONTRAST:  141mL OMNIPAQUE IOHEXOL 300 MG/ML  SOLN COMPARISON:  06/16/2012 FINDINGS: Lower chest: Cardiomegaly.  Included lung bases are clear. Hepatobiliary: No focal liver abnormality. There are a few small layering stones within the gallbladder lumen. No pericholecystic inflammatory changes by CT. No biliary dilatation. Pancreas: Unremarkable. No pancreatic ductal dilatation or surrounding inflammatory changes. Spleen: Normal in size without focal abnormality. Adrenals/Urinary Tract: Adrenal glands within normal limits. Multiple subcentimeter low-density lesions within both kidneys, too small to definitively characterized. No renal stone or hydronephrosis. Urinary bladder within normal limits for the degree of distension. Stomach/Bowel: Small hiatal hernia. Stomach otherwise within normal limits.  Small-bowel obstruction with transition point located within patient's inferior ventral abdominal wall hernia (series 5, image 84). Fecalization of small bowel content upstream from the site of obstruction. The small bowel and colon distal to the site of obstruction is decompressed. Extensive diverticulosis, most pronounced within the sigmoid colon Vascular/Lymphatic: Aortic atherosclerosis. No enlarged abdominal or pelvic lymph nodes. Reproductive: Status post hysterectomy. No adnexal masses. Other: Inferior ventral abdominal wall hernia with scarring along the anteroinferior margin of the hernia sac. No ascites. No organized abdominopelvic fluid collection. No pneumoperitoneum. Musculoskeletal: Partially visualized right hip arthroplasty. Advanced degenerative changes of the pubic symphysis with small joint effusion, nonspecific. Advanced degenerative disc disease and facet arthropathy of the lumbar spine. Multilevel bridging endplate osteophytes within the lower thoracic spine. IMPRESSION: 1. Small-bowel obstruction with transition point located within patient's inferior ventral abdominal wall hernia. 2. Extensive colonic diverticulosis without evidence of acute diverticulitis. 3. Cholelithiasis without evidence of acute cholecystitis. Aortic Atherosclerosis (ICD10-I70.0). Electronically Signed   By: Davina Poke D.O.   On: 04/27/2021 15:22    Scheduled Meds:    insulin aspart  0-6 Units Subcutaneous Q6H    Continuous Infusions:    heparin 1,300 Units/hr (04/28/21 1251)   lactated ringers 100 mL/hr at 04/28/21  1046     LOS: 1 day     Vernell Leep, MD,  FACP, [facility], [facility], [facility] (Care Management Physician Certified) [facility] [place]  To contact the attending provider between 7A-7P or the covering provider during after hours 7P-7A, please log into the web site [email or web address] and access using universal [place] password for that web site. If you do not have  the password, please call the hospital operator.  04/28/2021, 4:05 PM

## 2021-04-28 NOTE — Progress Notes (Signed)
Radiology called and notified that gastrograft was given to pt at 1229 today. They will fallow with x ray at the appropriate time.

## 2021-04-28 NOTE — Progress Notes (Signed)
Subjective No acute events. No n/v with NG in place. No flatus/BM at this juncture. Reports complete resolution of abdominal pain.  Objective: Vital signs in last 24 hours: Temp:  [97.8 F (36.6 C)-98.3 F (36.8 C)] 98.1 F (36.7 C) (01/15 0727) Pulse Rate:  [77-97] 82 (01/15 0727) Resp:  [14-30] 18 (01/15 0727) BP: (146-180)/(64-84) 152/74 (01/15 0727) SpO2:  [95 %-100 %] 97 % (01/15 0727) Weight:  [93 kg-95.3 kg] 93 kg (01/15 0600) Last BM Date: 04/26/21  Intake/Output from previous day: 01/14 0701 - 01/15 0700 In: 1335.6 [I.V.:1335.6] Out: 300 [Emesis/NG output:300] Intake/Output this shift: No intake/output data recorded.  Gen: NAD, comfortable, NG output somewhat bilious CV: RRR Pulm: Normal work of breathing Abd: Soft, obese, nontender, unclear if distention 2/2 habitus Ext: SCDs in place  Lab Results: CBC  Recent Labs    04/27/21 1336 04/28/21 0702  WBC 8.6 7.2  HGB 13.1 12.8  HCT 41.4 39.8  PLT 273 256   BMET Recent Labs    04/27/21 1336 04/28/21 0702  NA 134* 138  K 3.5 3.1*  CL 97* 100  CO2 26 26  GLUCOSE 206* 130*  BUN 14 11  CREATININE 0.57 0.66  CALCIUM 10.0 8.7*   PT/INR No results for input(s): LABPROT, INR in the last 72 hours. ABG No results for input(s): PHART, HCO3 in the last 72 hours.  Invalid input(s): PCO2, PO2  Studies/Results:  Anti-infectives: Anti-infectives (From admission, onward)    None        Assessment/Plan: Patient Active Problem List   Diagnosis Date Noted   SBO (small bowel obstruction) ([place]) 04/27/2021   Abdominal pain 04/27/2021   Nausea 04/27/2021   Pulmonary embolism ([place])    Agnosia 08/09/2020   Hardening of the aorta (main artery of the heart) ([place]) 08/09/2020   Morbid obesity ([place]) 08/09/2020   Neuropathy 08/09/2020   Primary osteoarthritis 08/09/2020   Sensorineural hearing loss (SNHL) of both ears 08/07/2020   Anosmia 06/05/2020   Tinnitus, bilateral 06/05/2020   GI bleed 07/13/2019    Acute blood loss anemia 07/12/2019   Microcytic anemia 01/05/2018   Melena 01/04/2018   Lower GI bleed 01/04/2018   Type 2 diabetes mellitus without complication ([place]) 67/89/3810   Bacteremia due to methicillin susceptible Staphylococcus aureus (MSSA) 12/02/2017   Tenosynovitis of left wrist 12/02/2017   Infection of left wrist ([place]) 11/30/2017   Primary osteoarthritis of right hip 01/05/2017   Osteoarthritis of right hip 01/03/2017   Lymphedema of upper extremity 01/17/2014   Osteopenia 01/17/2014   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   Dilated pore of [place] 08/02/2013   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  -Doing well, may be resolving based on XR this AM -Continue NG tube today, NPO, MIVF -SBO protocol ordered including gastrograffin via NG   LOS: 1 day   I spent a total of 36 minutes in both face-to-face and non-face-to-face activities, excluding procedures performed, for this visit on the date of this encounter.  Nadeen Landau, MD [facility], [facility]

## 2021-04-28 NOTE — Subjective & Objective (Signed)
PROGRESS NOTE   Tami Schmidt  KYH:[id number]    DOB: 10-23-1932    DOA: 04/27/2021  PCP: [name], MD   I have briefly reviewed patients previous medical records in [facility].  Chief Complaint  Patient presents with   Abdominal Pain    Brief Narrative:  88 year old female with medical history of breast cancer, s/p bilateral mastectomies s/p radiation and undergoing chemotherapy, recently diagnosed with acute pulmonary embolism (04/18/2021) and now on Xarelto, type II DM, HTN, HLD, hysterectomy, appendectomy, hernia repair presented to ED with complaints of 1 to 2 days history of progressive abdominal pain, nausea, vomiting and reduced oral intake.  BM and flatus 2 days PTA.  Admitted for small bowel obstruction with a transition within the ventral hernia.  General surgery consulted, managing conservatively with n.p.o., NG tube, IVF.  Holding Xarelto.   Assessment & Plan:  SBO (small bowel obstruction) (HCC) Prior surgeries as noted above. CT abdomen showed SBO, with a transition within a ventral hernia. General surgery consultation and follow-up appreciated. Treating conservatively with n.p.o., NG tube decompression, IV fluids. If fails to improve or worsens, may require surgery including LOA and ventral hernia repair but preferred to after 48 hours Xarelto washout. Seem to be clinically improving.  Passing flatus.  Pulmonary embolism ([place]) Diagnosed 04/18/2021. Last dose of Xarelto 04/26/2021.  Xarelto now on hold. Continue IV heparin drip per pharmacy.  Type 2 diabetes mellitus without complication (HCC) Holding home Humulin and metformin. Continue SSI Good inpatient control  Essential hypertension Holding home regimen including losartan, HCTZ and Norvasc. As needed IV hydralazine. Mildly uncontrolled.  Chest wall recurrence of breast cancer [facility]) Outpatient follow-up with oncology, Dr. Chryl Heck  Hypokalemia Replace IV and follow. Magnesium  normal.   Body mass index is 32.11 kg/m.     DVT prophylaxis: SCDs Start: 04/27/21 2016     Code Status: Full Code Family Communication: None at bedside. Disposition:  Status is: Inpatient  Remains inpatient appropriate because: Severity of illness        Consultants:   General surgery  Procedures:   NG tube  Antimicrobials:   None   Subjective:  Seen this morning.  Feels much better.  Abdominal pain resolved.  Passed some flatus this morning.  No BM.  Denies any other complaints  Objective:   Vitals:   04/28/21 0344 04/28/21 0600 04/28/21 0637 04/28/21 0727  BP: (!) 159/84  (!) 166/77 (!) 152/74  Pulse: 84  77 82  Resp: 16  14 18   Temp: 98.2 F (36.8 C)  97.8 F (36.6 C) 98.1 F (36.7 C)  TempSrc: Oral   Oral  SpO2: 96%  97% 97%  Weight:  93 kg    Height:        General exam: Elderly female, moderately built and obese lying comfortably propped up in bed without distress.  NG tube in place connected to low wall suction. Respiratory system: Clear to auscultation. Respiratory effort normal. Cardiovascular system: S1 & S2 heard, RRR. No JVD, murmurs, rubs, gallops or clicks. No pedal edema. Gastrointestinal system: Abdomen is nondistended/obese, soft and nontender. No organomegaly or masses felt. Normal bowel sounds heard. Central nervous system: Alert and oriented. No focal neurological deficits. Extremities: Symmetric 5 x 5 power. Skin: No rashes, lesions or ulcers Psychiatry: Judgement and insight appear normal. Mood & affect appropriate.     Data Reviewed:   I have personally reviewed following labs and imaging studies   CBC: Recent Labs  Lab 04/27/21 1336  04/28/21 0702  WBC 8.6 7.2  NEUTROABS  --  5.0  HGB 13.1 12.8  HCT 41.4 39.8  MCV 77.5* 77.7*  PLT 273 161    Basic Metabolic Panel: Recent Labs  Lab 04/27/21 1336 04/28/21 0702  NA 134* 138  K 3.5 3.1*  CL 97* 100  CO2 26 26  GLUCOSE 206* 130*  BUN 14 11  CREATININE 0.57  0.66  CALCIUM 10.0 8.7*  MG  --  1.9    Liver Function Tests: Recent Labs  Lab 04/27/21 1336 04/28/21 0702  AST 14* 14*  ALT 7 8  ALKPHOS 58 55  BILITOT 0.7 0.9  PROT 8.0 6.2*  ALBUMIN 4.5 3.4*    CBG: Recent Labs  Lab 04/28/21 0022 04/28/21 0602 04/28/21 1214  GLUCAP 137* 120* 121*    Microbiology Studies:   Recent Results (from the past 240 hour(s))  Resp Panel by RT-PCR (Flu A&B, Covid) Nasopharyngeal Swab     Status: None   Collection Time: 04/27/21  5:31 PM   Specimen: Nasopharyngeal Swab; Nasopharyngeal(NP) swabs in vial transport medium  Result Value Ref Range Status   SARS Coronavirus 2 by RT PCR NEGATIVE NEGATIVE Final    Comment: (NOTE) SARS-CoV-2 target nucleic acids are NOT DETECTED.  The SARS-CoV-2 RNA is generally detectable in upper respiratory specimens during the acute phase of infection. The lowest concentration of SARS-CoV-2 viral copies this assay can detect is 138 copies/mL. A negative result does not preclude SARS-Cov-2 infection and should not be used as the sole basis for treatment or other patient management decisions. A negative result may occur with  improper specimen collection/handling, submission of specimen other than nasopharyngeal swab, presence of viral mutation(s) within the areas targeted by this assay, and inadequate number of viral copies(<138 copies/mL). A negative result must be combined with clinical observations, patient history, and epidemiological information. The expected result is Negative.  Fact Sheet for Patients:  EntrepreneurPulse.com.au  Fact Sheet for Healthcare Providers:  IncredibleEmployment.be  This test is no t yet approved or cleared by the [place] FDA and  has been authorized for detection and/or diagnosis of SARS-CoV-2 by FDA under an Emergency Use Authorization (EUA). This EUA will remain  in effect (meaning this test can be used) for the duration of  the COVID-19 declaration under Section 564(b)(1) of the Act, 21 U.S.C.section 360bbb-3(b)(1), unless the authorization is terminated  or revoked sooner.       Influenza A by PCR NEGATIVE NEGATIVE Final   Influenza B by PCR NEGATIVE NEGATIVE Final    Comment: (NOTE) The Xpert Xpress SARS-CoV-2/FLU/RSV plus assay is intended as an aid in the diagnosis of influenza from Nasopharyngeal swab specimens and should not be used as a sole basis for treatment. Nasal washings and aspirates are unacceptable for Xpert Xpress SARS-CoV-2/FLU/RSV testing.  Fact Sheet for Patients: EntrepreneurPulse.com.au  Fact Sheet for Healthcare Providers: IncredibleEmployment.be  This test is not yet approved or cleared by the [place] FDA and has been authorized for detection and/or diagnosis of SARS-CoV-2 by FDA under an Emergency Use Authorization (EUA). This EUA will remain in effect (meaning this test can be used) for the duration of the COVID-19 declaration under Section 564(b)(1) of the Act, 21 U.S.C. section 360bbb-3(b)(1), unless the authorization is terminated or revoked.  Performed at [facility], [address] [place], [place] [address]     Radiology Studies:  DG Abd 1 View  Result Date: 04/28/2021 CLINICAL DATA:  Check nasogastric catheter placement EXAM: ABDOMEN -  1 VIEW COMPARISON:  Film from earlier in the same day. FINDINGS: Gastric catheter has been advanced and now lies within the stomach. No obstructive changes are seen at this time. No free air is noted. IMPRESSION: Gastric catheter now lies within the stomach in appropriate position. Electronically Signed   By: Inez Catalina M.D.   On: 04/28/2021 00:12   DG Abd 1 View  Result Date: 04/27/2021 CLINICAL DATA:  NGT placement. EXAM: ABDOMEN - 1 VIEW COMPARISON:  CT with IV contrast earlier today. FINDINGS: NGT is in place with the tip just below the hiatus and the side hole  in the distal esophagus and needs to be advanced further in 12-15 cm for better positioning. Imaging includes the upper to mid abdomen excluding the true pelvis. The bowel pattern again demonstrating evidence of small-bowel obstruction with small bowel dilatation up to 3.6 cm. There is no supine evidence of free air. Visceral shadows are stable; no appreciable pathologic calcification. Cardiomegaly. IMPRESSION: 1. Enteric tube should be advanced further in 12-15 cm. 2. Small bowel dilatation is unchanged. Electronically Signed   By: Telford Nab M.D.   On: 04/27/2021 22:57   CT ABDOMEN PELVIS W CONTRAST  Result Date: 04/27/2021 CLINICAL DATA:  Abdominal pain, acute, nonlocalized. Nausea and vomiting EXAM: CT ABDOMEN AND PELVIS WITH CONTRAST TECHNIQUE: Multidetector CT imaging of the abdomen and pelvis was performed using the standard protocol following bolus administration of intravenous contrast. RADIATION DOSE REDUCTION: This exam was performed according to the departmental dose-optimization program which includes automated exposure control, adjustment of the mA and/or kV according to patient size and/or use of iterative reconstruction technique. CONTRAST:  113mL OMNIPAQUE IOHEXOL 300 MG/ML  SOLN COMPARISON:  06/16/2012 FINDINGS: Lower chest: Cardiomegaly.  Included lung bases are clear. Hepatobiliary: No focal liver abnormality. There are a few small layering stones within the gallbladder lumen. No pericholecystic inflammatory changes by CT. No biliary dilatation. Pancreas: Unremarkable. No pancreatic ductal dilatation or surrounding inflammatory changes. Spleen: Normal in size without focal abnormality. Adrenals/Urinary Tract: Adrenal glands within normal limits. Multiple subcentimeter low-density lesions within both kidneys, too small to definitively characterized. No renal stone or hydronephrosis. Urinary bladder within normal limits for the degree of distension. Stomach/Bowel: Small hiatal hernia.  Stomach otherwise within normal limits. Small-bowel obstruction with transition point located within patient's inferior ventral abdominal wall hernia (series 5, image 84). Fecalization of small bowel content upstream from the site of obstruction. The small bowel and colon distal to the site of obstruction is decompressed. Extensive diverticulosis, most pronounced within the sigmoid colon Vascular/Lymphatic: Aortic atherosclerosis. No enlarged abdominal or pelvic lymph nodes. Reproductive: Status post hysterectomy. No adnexal masses. Other: Inferior ventral abdominal wall hernia with scarring along the anteroinferior margin of the hernia sac. No ascites. No organized abdominopelvic fluid collection. No pneumoperitoneum. Musculoskeletal: Partially visualized right hip arthroplasty. Advanced degenerative changes of the pubic symphysis with small joint effusion, nonspecific. Advanced degenerative disc disease and facet arthropathy of the lumbar spine. Multilevel bridging endplate osteophytes within the lower thoracic spine. IMPRESSION: 1. Small-bowel obstruction with transition point located within patient's inferior ventral abdominal wall hernia. 2. Extensive colonic diverticulosis without evidence of acute diverticulitis. 3. Cholelithiasis without evidence of acute cholecystitis. Aortic Atherosclerosis (ICD10-I70.0). Electronically Signed   By: Davina Poke D.O.   On: 04/27/2021 15:22    Scheduled Meds:    insulin aspart  0-6 Units Subcutaneous Q6H    Continuous Infusions:    heparin 1,300 Units/hr (04/28/21 1251)   lactated ringers 100 mL/hr  at 04/28/21 1046     LOS: 1 day     Vernell Leep, MD,  FACP, [facility], [facility], [facility] (Care Management Physician Certified) [place]  To contact the attending provider between 7A-7P or the covering provider during after hours 7P-7A, please log into the web site [email or web address] and access using universal [place]  password for that web site. If you do not have the password, please call the hospital operator.  04/28/2021, 3:59 PM

## 2021-04-28 NOTE — Progress Notes (Addendum)
[facility] for heparin Indication:  hx PE  Allergies  Allergen Reactions   Bactrim Swelling    SWELLING OF MOUTH/FACE.   Lisinopril Swelling    SWELLING OF MOUTH/FACE.   Vasotec Swelling    SWELLING OF MOUTH/FACE.   Sulfamethoxazole-Trimethoprim     Other reaction(s): Unknown    Patient Measurements: Height: 5\' 7"  (170.2 cm) Weight: 93 kg (205 lb 0.4 oz) IBW/kg (Calculated) : 61.6 Heparin Dosing Weight: 82kg  Vital Signs: Temp: 98.1 F (36.7 C) (01/15 0727) Temp Source: Oral (01/15 0727) BP: 152/74 (01/15 0727) Pulse Rate: 82 (01/15 0727)  Labs: Recent Labs    04/27/21 1336 04/28/21 0702  HGB 13.1 12.8  HCT 41.4 39.8  PLT 273 256  APTT  --  130*  LABPROT  --  15.3*  INR  --  1.2  HEPARINUNFRC  --  1.10*  CREATININE 0.57 0.66     Estimated Creatinine Clearance: 59.1 mL/min (by C-G formula based on SCr of 0.66 mg/dL).   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 Pt has hx PE (02/2021) on Xarelto PTA, pharmacy asked to dose IV heparin while unable to give POs. Last Xarelto dose was Thursday. CBC wnl on admit.  PTT came back elevated at 130 this AM and heparin level is also elevated. Heparin level/aPTT were drawn out of the same side as drip. This is probably the reason for the elevated level. We will try to hold heparin for a few minutes before the repeat collection.    Addendum  Repeat PTT came back 93, HL 0.91. Not correlating yet. We will continue with the same rate and check a confirm level in AM.  Goal of Therapy:  Heparin level 0.3-0.7 units/ml aPTT 66-102 seconds Monitor platelets by anticoagulation protocol: Yes   Plan:  Heparin 1300 units/h Daily PTT/HL/CBC   Onnie Boer, PharmD, BCIDP, AAHIVP, CPP Infectious Disease Pharmacist 04/28/2021 9:19 AM

## 2021-04-28 NOTE — Assessment & Plan Note (Signed)
Outpatient follow-up with oncology, Dr. Chryl Heck

## 2021-04-28 NOTE — Assessment & Plan Note (Addendum)
Prior surgeries as noted above. CT abdomen showed SBO, with a transition within a ventral hernia. General surgery consultation and follow-up appreciated. Treating conservatively with n.p.o., NG tube decompression, IV fluids. If fails to improve or worsens, may require surgery including LOA and ventral hernia repair but preferred to after 48 hours Xarelto washout. Passing flatus, multiple BMs overnight. CCS follow-up appreciated.  SP protocol with contrast in the colon and return of bowel function. [place] NG tube, starting clear liquid diet and advance as tolerated to soft diet.

## 2021-04-28 NOTE — Assessment & Plan Note (Addendum)
Replaced. Magnesium normal.

## 2021-04-28 NOTE — Assessment & Plan Note (Addendum)
Diagnosed 04/18/2021. Last dose of Xarelto 04/26/2021.  Xarelto now on hold. Continue IV heparin drip per pharmacy. Once she is clinically progressed to soft diet, can transition from IV heparin back to Xarelto.

## 2021-04-28 NOTE — Assessment & Plan Note (Addendum)
Holding home regimen including losartan, HCTZ and Norvasc. As needed IV hydralazine. Reasonably controlled.

## 2021-04-28 NOTE — Hospital Course (Addendum)
88 year old female with medical history of breast cancer, s/p bilateral mastectomies s/p radiation and undergoing chemotherapy, recently diagnosed with acute pulmonary embolism (04/18/2021) and now on Xarelto, type II DM, HTN, HLD, hysterectomy, appendectomy, hernia repair presented to ED with complaints of 1 to 2 days history of progressive abdominal pain, nausea, vomiting and reduced oral intake.  BM and flatus 2 days PTA.  Admitted for small bowel obstruction with a transition within the ventral hernia.  General surgery consulted, managing conservatively with n.p.o., NG tube, IVF.  Holding Xarelto.  Improved

## 2021-04-28 NOTE — Assessment & Plan Note (Signed)
Holding home Humulin and metformin. Continue SSI Good inpatient control

## 2021-04-28 NOTE — Plan of Care (Signed)

## 2021-04-29 ENCOUNTER — Inpatient Hospital Stay (HOSPITAL_COMMUNITY): Payer: Medicare Other

## 2021-04-29 DIAGNOSIS — R718 Other abnormality of red blood cells: Secondary | ICD-10-CM | POA: Diagnosis present

## 2021-04-29 LAB — CBC
HCT: 40 % (ref 36.0–46.0)
Hemoglobin: 12.4 g/dL (ref 12.0–15.0)
MCH: 24.6 pg — ABNORMAL LOW (ref 26.0–34.0)
MCHC: 31 g/dL (ref 30.0–36.0)
MCV: 79.2 fL — ABNORMAL LOW (ref 80.0–100.0)
Platelets: 194 10*3/uL (ref 150–400)
RBC: 5.05 MIL/uL (ref 3.87–5.11)
RDW: 16.3 % — ABNORMAL HIGH (ref 11.5–15.5)
WBC: 7 10*3/uL (ref 4.0–10.5)
nRBC: 0 % (ref 0.0–0.2)

## 2021-04-29 LAB — HEPARIN LEVEL (UNFRACTIONATED): Heparin Unfractionated: 0.87 IU/mL — ABNORMAL HIGH (ref 0.30–0.70)

## 2021-04-29 LAB — APTT: aPTT: 98 seconds — ABNORMAL HIGH (ref 24–36)

## 2021-04-29 LAB — BASIC METABOLIC PANEL
Anion gap: 11 (ref 5–15)
BUN: 11 mg/dL (ref 8–23)
CO2: 27 mmol/L (ref 22–32)
Calcium: 8.9 mg/dL (ref 8.9–10.3)
Chloride: 101 mmol/L (ref 98–111)
Creatinine, Ser: 0.7 mg/dL (ref 0.44–1.00)
GFR, Estimated: >60 mL/min (ref >60)
Glucose, Bld: 102 mg/dL — ABNORMAL HIGH (ref 70–99)
Potassium: 3.6 mmol/L (ref 3.5–5.1)
Sodium: 139 mmol/L (ref 135–145)

## 2021-04-29 LAB — GLUCOSE, CAPILLARY
Glucose-Capillary: 99 mg/dL (ref 70–99)
Glucose-Capillary: 103 mg/dL — ABNORMAL HIGH (ref 70–99)
Glucose-Capillary: 123 mg/dL — ABNORMAL HIGH (ref 70–99)
Glucose-Capillary: 125 mg/dL — ABNORMAL HIGH (ref 70–99)

## 2021-04-29 NOTE — Progress Notes (Addendum)
Subjective No acute events. Has had flatus and multiple BMs last 24 hours. Denies abdominal pain or distention.   Objective: Vital signs in last 24 hours: Temp:  [98 F (36.7 C)-98.7 F (37.1 C)] 98 F (36.7 C) (01/16 0723) Pulse Rate:  [81-84] 81 (01/16 0723) Resp:  [14-18] 18 (01/16 0723) BP: (139-150)/(66-71) 139/66 (01/16 0723) SpO2:  [95 %-98 %] 95 % (01/16 0723) Weight:  [93.4 kg] 93.4 kg (01/16 0500) Last BM Date: 04/28/21  Intake/Output from previous day: 01/15 0701 - 01/16 0700 In: 0  Out: 250 [Emesis/NG output:250] Intake/Output this shift: No intake/output data recorded.  Gen: NAD, comfortable, NG output clear and light brown, red-tinge from chloraseptic  CV: RRR Pulm: Normal work of breathing Abd: Soft, obese, nontender, previous laparotomy scar  Ext: SCDs in place  Lab Results: CBC  Recent Labs    04/28/21 0702 04/29/21 0241  WBC 7.2 7.0  HGB 12.8 12.4  HCT 39.8 40.0  PLT 256 194   BMET Recent Labs    04/28/21 0702 04/29/21 0241  NA 138 139  K 3.1* 3.6  CL 100 101  CO2 26 27  GLUCOSE 130* 102*  BUN 11 11  CREATININE 0.66 0.70  CALCIUM 8.7* 8.9   PT/INR Recent Labs    04/28/21 0702  LABPROT 15.3*  INR 1.2   ABG No results for input(s): PHART, HCO3 in the last 72 hours.  Invalid input(s): PCO2, PO2  Studies/Results:  Anti-infectives: Anti-infectives (From admission, onward)    None        Assessment/Plan: Patient Active Problem List   Diagnosis Date Noted   Hypokalemia 04/28/2021   SBO (small bowel obstruction) ([place]) 04/27/2021   Pulmonary embolism ([place])    [place] 08/09/2020   Hardening of the aorta (main artery of the heart) ([place]) 08/09/2020   Morbid obesity ([place]) 08/09/2020   Neuropathy 08/09/2020   Primary osteoarthritis 08/09/2020   Sensorineural hearing loss (SNHL) of both ears 08/07/2020   Anosmia 06/05/2020   Tinnitus, bilateral 06/05/2020   GI bleed 07/13/2019   Acute blood loss anemia 07/12/2019    Microcytic anemia 01/05/2018   Melena 01/04/2018   Lower GI bleed 01/04/2018   Type 2 diabetes mellitus without complication ([place]) 26/71/2458   Bacteremia due to methicillin susceptible Staphylococcus aureus (MSSA) 12/02/2017   Tenosynovitis of left wrist 12/02/2017   Infection of left wrist ([place]) 11/30/2017   Primary osteoarthritis of right hip 01/05/2017   Osteoarthritis of right hip 01/03/2017   Lymphedema of upper extremity 01/17/2014   Osteopenia 01/17/2014   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   Dilated pore of [place] 08/02/2013   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  -Doing well, SB protocol with contrast in the colon and return of bowel function - D/C NG tube and start CLD advance as tolerated to SOFT  low decision making.    LOS: 2 days    Obie Dredge, [facility], [place]

## 2021-04-29 NOTE — Progress Notes (Addendum)
PROGRESS NOTE   Tami Schmidt  VQM:[id number]    DOB: 1932-10-17    DOA: 04/27/2021  PCP: [name], MD   I have briefly reviewed patients previous medical records in [facility]  Chief Complaint  Patient presents with   Abdominal Pain    Brief Narrative:  88 year old female with medical history of breast cancer, s/p bilateral mastectomies s/p radiation and undergoing chemotherapy, recently diagnosed with acute pulmonary embolism (04/18/2021) and now on Xarelto, type II DM, HTN, HLD, hysterectomy, appendectomy, hernia repair presented to ED with complaints of 1 to 2 days history of progressive abdominal pain, nausea, vomiting and reduced oral intake.  BM and flatus 2 days PTA.  Admitted for small bowel obstruction with a transition within the ventral hernia.  General surgery consulted, managing conservatively with n.p.o., NG tube, IVF.  Holding Xarelto.  Improved    Assessment & Plan:  SBO (small bowel obstruction) ([place]) Prior surgeries as noted above. CT abdomen showed SBO, with a transition within a ventral hernia. General surgery consultation and follow-up appreciated. Treating conservatively with n.p.o., NG tube decompression, IV fluids. If fails to improve or worsens, may require surgery including LOA and ventral hernia repair but preferred to after 48 hours Xarelto washout. Passing flatus, multiple BMs overnight. CCS follow-up appreciated.  SP protocol with contrast in the colon and return of bowel function. Carrizales NG tube, starting clear liquid diet and advance as tolerated to soft diet.  Pulmonary embolism (Tami Schmidt) Diagnosed 04/18/2021. Last dose of Xarelto 04/26/2021.  Xarelto now on hold. Continue IV heparin drip per pharmacy. Once she is clinically progressed to soft diet, can transition from IV heparin back to Xarelto.  Type 2 diabetes mellitus without complication (HCC) Holding home Humulin and metformin. Continue SSI Good inpatient control  Essential  hypertension Holding home regimen including losartan, HCTZ and Norvasc. As needed IV hydralazine. Reasonably controlled.  Chest wall recurrence of breast cancer [facility]) Outpatient follow-up with oncology, Dr. Chryl Heck  Hypokalemia Replaced. Magnesium normal.  Microcytosis Hemoglobin is normal.   Needs further evaluation as outpatient.    Body mass index is 32.25 kg/m.    DVT prophylaxis: SCDs Start: 04/27/21 2016     Code Status: Full Code Family Communication: Discussed with spouse via phone. Disposition:  Status is: Inpatient  Remains inpatient appropriate because: Severity of illness        Consultants:   General surgery  Procedures:   NG tube  Antimicrobials:      Subjective:  Reports that she had 4 BMs overnight.  Passing flatus.  No abdominal pain.  Throat discomfort from NG tube.  Objective:   Vitals:   04/28/21 2200 04/29/21 0500 04/29/21 0620 04/29/21 0723  BP: (!) 141/71  (!) 150/70 139/66  Pulse: 82  84 81  Resp: 18  14 18   Temp: 98.7 F (37.1 C)  98.2 F (36.8 C) 98 F (36.7 C)  TempSrc: Oral   Oral  SpO2: 97%  98% 95%  Weight:  93.4 kg    Height:        General exam: Elderly female, moderately built and obese lying comfortably propped up in bed without distress.  Oral mucosa moist.  NG tube in place but clamped when I saw her this morning. Respiratory system: Clear to auscultation.  No increased work of breathing. Cardiovascular system: S1 & S2 heard, RRR. No JVD, murmurs, rubs, gallops or clicks. No pedal edema. Gastrointestinal system: Abdomen is nondistended, soft and nontender.  Normal bowel sounds heard.. Central  nervous system: Alert and oriented. No focal neurological deficits. Extremities: Symmetric 5 x 5 power. Skin: No rashes, lesions or ulcers Psychiatry: Judgement and insight appear normal. Mood & affect appropriate.     Data Reviewed:   I have personally reviewed following labs and imaging studies   CBC: Recent  Labs  Lab 05/27/21 1336 04/28/21 0702 04/29/21 0241  WBC 8.6 7.2 7.0  NEUTROABS  --  5.0  --   HGB 13.1 12.8 12.4  HCT 41.4 39.8 40.0  MCV 77.5* 77.7* 79.2*  PLT 273 256 353    Basic Metabolic Panel: Recent Labs  Lab 05/27/2021 1336 04/28/21 0702 04/29/21 0241  NA 134* 138 139  K 3.5 3.1* 3.6  CL 97* 100 101  CO2 26 26 27   GLUCOSE 206* 130* 102*  BUN 14 11 11   CREATININE 0.57 0.66 0.70  CALCIUM 10.0 8.7* 8.9  MG  --  1.9  --     Liver Function Tests: Recent Labs  Lab May 27, 2021 1336 04/28/21 0702  AST 14* 14*  ALT 7 8  ALKPHOS 58 55  BILITOT 0.7 0.9  PROT 8.0 6.2*  ALBUMIN 4.5 3.4*    CBG: Recent Labs  Lab 04/29/21 0038 04/29/21 0607 04/29/21 1154  GLUCAP 103* 99 123*    Microbiology Studies:   Recent Results (from the past 240 hour(s))  Resp Panel by RT-PCR (Flu A&B, Covid) Nasopharyngeal Swab     Status: None   Collection Time: 27-May-2021  5:31 PM   Specimen: Nasopharyngeal Swab; Nasopharyngeal(NP) swabs in vial transport medium  Result Value Ref Range Status   SARS Coronavirus 2 by RT PCR NEGATIVE NEGATIVE Final    Comment: (NOTE) SARS-CoV-2 target nucleic acids are NOT DETECTED.  The SARS-CoV-2 RNA is generally detectable in upper respiratory specimens during the acute phase of infection. The lowest concentration of SARS-CoV-2 viral copies this assay can detect is 138 copies/mL. A negative result does not preclude SARS-Cov-2 infection and should not be used as the sole basis for treatment or other patient management decisions. A negative result may occur with  improper specimen collection/handling, submission of specimen other than nasopharyngeal swab, presence of viral mutation(s) within the areas targeted by this assay, and inadequate number of viral copies(<138 copies/mL). A negative result must be combined with clinical observations, patient history, and epidemiological information. The expected result is Negative.  Fact Sheet for  Patients:  EntrepreneurPulse.com.au  Fact Sheet for Healthcare Providers:  IncredibleEmployment.be  This test is no t yet approved or cleared by the [place] FDA and  has been authorized for detection and/or diagnosis of SARS-CoV-2 by FDA under an Emergency Use Authorization (EUA). This EUA will remain  in effect (meaning this test can be used) for the duration of the COVID-19 declaration under Section 564(b)(1) of the Act, 21 U.S.C.section 360bbb-3(b)(1), unless the authorization is terminated  or revoked sooner.       Influenza A by PCR NEGATIVE NEGATIVE Final   Influenza B by PCR NEGATIVE NEGATIVE Final    Comment: (NOTE) The Xpert Xpress SARS-CoV-2/FLU/RSV plus assay is intended as an aid in the diagnosis of influenza from Nasopharyngeal swab specimens and should not be used as a sole basis for treatment. Nasal washings and aspirates are unacceptable for Xpert Xpress SARS-CoV-2/FLU/RSV testing.  Fact Sheet for Patients: EntrepreneurPulse.com.au  Fact Sheet for Healthcare Providers: IncredibleEmployment.be  This test is not yet approved or cleared by the [place] FDA and has been authorized for detection and/or diagnosis of SARS-CoV-2 by  FDA under an Emergency Use Authorization (EUA). This EUA will remain in effect (meaning this test can be used) for the duration of the COVID-19 declaration under Section 564(b)(1) of the Act, 21 U.S.C. section 360bbb-3(b)(1), unless the authorization is terminated or revoked.  Performed at [facility], [address], [place], [place] [address]     Radiology Studies:  DG Abd 1 View  Result Date: 04/28/2021 CLINICAL DATA:  Check nasogastric catheter placement EXAM: ABDOMEN - 1 VIEW COMPARISON:  Film from earlier in the same day. FINDINGS: Gastric catheter has been advanced and now lies within the stomach. No obstructive changes are  seen at this time. No free air is noted. IMPRESSION: Gastric catheter now lies within the stomach in appropriate position. Electronically Signed   By: Inez Catalina M.D.   On: 04/28/2021 00:12   DG Abd 1 View  Result Date: 04/27/2021 CLINICAL DATA:  NGT placement. EXAM: ABDOMEN - 1 VIEW COMPARISON:  CT with IV contrast earlier today. FINDINGS: NGT is in place with the tip just below the hiatus and the side hole in the distal esophagus and needs to be advanced further in 12-15 cm for better positioning. Imaging includes the upper to mid abdomen excluding the true pelvis. The bowel pattern again demonstrating evidence of small-bowel obstruction with small bowel dilatation up to 3.6 cm. There is no supine evidence of free air. Visceral shadows are stable; no appreciable pathologic calcification. Cardiomegaly. IMPRESSION: 1. Enteric tube should be advanced further in 12-15 cm. 2. Small bowel dilatation is unchanged. Electronically Signed   By: Telford Nab M.D.   On: 04/27/2021 22:57   CT ABDOMEN PELVIS W CONTRAST  Result Date: 04/27/2021 CLINICAL DATA:  Abdominal pain, acute, nonlocalized. Nausea and vomiting EXAM: CT ABDOMEN AND PELVIS WITH CONTRAST TECHNIQUE: Multidetector CT imaging of the abdomen and pelvis was performed using the standard protocol following bolus administration of intravenous contrast. RADIATION DOSE REDUCTION: This exam was performed according to the departmental dose-optimization program which includes automated exposure control, adjustment of the mA and/or kV according to patient size and/or use of iterative reconstruction technique. CONTRAST:  131mL OMNIPAQUE IOHEXOL 300 MG/ML  SOLN COMPARISON:  06/16/2012 FINDINGS: Lower chest: Cardiomegaly.  Included lung bases are clear. Hepatobiliary: No focal liver abnormality. There are a few small layering stones within the gallbladder lumen. No pericholecystic inflammatory changes by CT. No biliary dilatation. Pancreas: Unremarkable. No  pancreatic ductal dilatation or surrounding inflammatory changes. Spleen: Normal in size without focal abnormality. Adrenals/Urinary Tract: Adrenal glands within normal limits. Multiple subcentimeter low-density lesions within both kidneys, too small to definitively characterized. No renal stone or hydronephrosis. Urinary bladder within normal limits for the degree of distension. Stomach/Bowel: Small hiatal hernia. Stomach otherwise within normal limits. Small-bowel obstruction with transition point located within patient's inferior ventral abdominal wall hernia (series 5, image 84). Fecalization of small bowel content upstream from the site of obstruction. The small bowel and colon distal to the site of obstruction is decompressed. Extensive diverticulosis, most pronounced within the sigmoid colon Vascular/Lymphatic: Aortic atherosclerosis. No enlarged abdominal or pelvic lymph nodes. Reproductive: Status post hysterectomy. No adnexal masses. Other: Inferior ventral abdominal wall hernia with scarring along the anteroinferior margin of the hernia sac. No ascites. No organized abdominopelvic fluid collection. No pneumoperitoneum. Musculoskeletal: Partially visualized right hip arthroplasty. Advanced degenerative changes of the pubic symphysis with small joint effusion, nonspecific. Advanced degenerative disc disease and facet arthropathy of the lumbar spine. Multilevel bridging endplate osteophytes within the lower thoracic spine. IMPRESSION:  1. Small-bowel obstruction with transition point located within patient's inferior ventral abdominal wall hernia. 2. Extensive colonic diverticulosis without evidence of acute diverticulitis. 3. Cholelithiasis without evidence of acute cholecystitis. Aortic Atherosclerosis (ICD10-I70.0). Electronically Signed   By: Davina Poke D.O.   On: 04/27/2021 15:22   DG Abd Portable 1V-Small Bowel Obstruction Protocol-initial, 8 hr delay  Result Date: 04/28/2021 CLINICAL DATA:   Gastrografin given at 1230, there was miscommunication about when the contrast was given so patient missed their 8 hour delay. EXAM: PORTABLE ABDOMEN - 1 VIEW COMPARISON:  None. FINDINGS: PO contrast reaches the rectum. The bowel gas pattern is normal. No radio-opaque calculi or other significant radiographic abnormality are seen. Enteric tube with tip coursing below the hemidiaphragm overlying the expected region of the gastric antrum/pyloric region. Partially visualized total right hip arthroplasty. IMPRESSION: 1. PO contrast reaches the rectum. 2. Enteric tube in grossly appropriate position. Electronically Signed   By: Iven Finn M.D.   On: 04/28/2021 23:21    Scheduled Meds:    insulin aspart  0-6 Units Subcutaneous Q6H    Continuous Infusions:    heparin 1,300 Units/hr (04/29/21 0905)   lactated ringers 100 mL/hr at 04/29/21 0752     LOS: 2 days     Vernell Leep, MD,  FACP, [facility], [facility], [facility] (Care Management Physician Certified) [place]  To contact the attending provider between 7A-7P or the covering provider during after hours 7P-7A, please log into the web site [email or web address] and access using universal [place] password for that web site. If you do not have the password, please call the hospital operator.  04/29/2021, 12:09 PM

## 2021-04-29 NOTE — Assessment & Plan Note (Signed)
Hemoglobin is normal.   Needs further evaluation as outpatient.

## 2021-04-29 NOTE — Progress Notes (Signed)
ANTICOAGULATION CONSULT NOTE- follow-up  Pharmacy Consult for heparin Indication:  hx PE  Allergies  Allergen Reactions   Bactrim Swelling    SWELLING OF MOUTH/FACE.   Lisinopril Swelling    SWELLING OF MOUTH/FACE.   Vasotec Swelling    SWELLING OF MOUTH/FACE.   Sulfamethoxazole-Trimethoprim     Other reaction(s): Unknown    Patient Measurements: Height: 5\' 7"  (170.2 cm) Weight: 93.4 kg (205 lb 14.6 oz) IBW/kg (Calculated) : 61.6 Heparin Dosing Weight: 82kg  Vital Signs: Temp: 98 F (36.7 C) (01/16 0723) Temp Source: Oral (01/16 0723) BP: 139/66 (01/16 0723) Pulse Rate: 81 (01/16 0723)  Labs: Recent Labs    04/27/21 1336 04/28/21 0702 04/28/21 1010 04/29/21 0241 04/29/21 0551  HGB 13.1 12.8  --  12.4  --   HCT 41.4 39.8  --  40.0  --   PLT 273 256  --  194  --   APTT  --  130* 93*  --  98*  LABPROT  --  15.3*  --   --   --   INR  --  1.2  --   --   --   HEPARINUNFRC  --  1.10* 0.91*  --  0.87*  CREATININE 0.57 0.66  --  0.70  --      Estimated Creatinine Clearance: 59.2 mL/min (by C-G formula based on SCr of 0.7 mg/dL).   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 Pt has hx PE (02/2021) on Xarelto PTA, pharmacy asked to dose IV heparin while unable to give POs. Last Xarelto dose was Thursday. CBC wnl on admit.  aPTT resulted as 98 seconds and heparin level resulted as 0.87 from 05:51 lab draw.    Spoke with patient. She states she only has one limb that phlebotomy can draw labs from which is the same limb the heparin infusion is running through.   Goal of Therapy:  Heparin level 0.3-0.7 units/ml aPTT 66-102 seconds Monitor platelets by anticoagulation protocol: Yes   Plan:  Continue heparin at 1300 units/h Daily PTT/HL/CBC   Vaughan Basta BS, PharmD, BCPS Clinical Pharmacist 04/29/2021 9:03 AM

## 2021-04-30 LAB — GLUCOSE, CAPILLARY
Glucose-Capillary: 104 mg/dL — ABNORMAL HIGH (ref 70–99)
Glucose-Capillary: 116 mg/dL — ABNORMAL HIGH (ref 70–99)
Glucose-Capillary: 127 mg/dL — ABNORMAL HIGH (ref 70–99)

## 2021-04-30 LAB — CBC
HCT: 36.4 % (ref 36.0–46.0)
Hemoglobin: 11.3 g/dL — ABNORMAL LOW (ref 12.0–15.0)
MCH: 24.9 pg — ABNORMAL LOW (ref 26.0–34.0)
MCHC: 31 g/dL (ref 30.0–36.0)
MCV: 80.2 fL (ref 80.0–100.0)
Platelets: 198 10*3/uL (ref 150–400)
RBC: 4.54 MIL/uL (ref 3.87–5.11)
RDW: 16.1 % — ABNORMAL HIGH (ref 11.5–15.5)
WBC: 6.5 10*3/uL (ref 4.0–10.5)
nRBC: 0 % (ref 0.0–0.2)

## 2021-04-30 LAB — BASIC METABOLIC PANEL
Anion gap: 8 (ref 5–15)
BUN: 8 mg/dL (ref 8–23)
CO2: 27 mmol/L (ref 22–32)
Calcium: 8.6 mg/dL — ABNORMAL LOW (ref 8.9–10.3)
Chloride: 103 mmol/L (ref 98–111)
Creatinine, Ser: 0.7 mg/dL (ref 0.44–1.00)
GFR, Estimated: >60 mL/min (ref >60)
Glucose, Bld: 98 mg/dL (ref 70–99)
Potassium: 3.3 mmol/L — ABNORMAL LOW (ref 3.5–5.1)
Sodium: 138 mmol/L (ref 135–145)

## 2021-04-30 LAB — HEPARIN LEVEL (UNFRACTIONATED): Heparin Unfractionated: 0.71 IU/mL — ABNORMAL HIGH (ref 0.30–0.70)

## 2021-04-30 LAB — APTT: aPTT: 95 seconds — ABNORMAL HIGH (ref 24–36)

## 2021-04-30 MED ORDER — POTASSIUM CHLORIDE CRYS ER 20 MEQ PO TBCR
40.0000 meq | EXTENDED_RELEASE_TABLET | Freq: Once | ORAL | Status: AC
  Administered 2021-04-30: 40 meq via ORAL
  Filled 2021-04-30: qty 2

## 2021-04-30 MED ORDER — RIVAROXABAN 20 MG PO TABS
20.0000 mg | ORAL_TABLET | Freq: Every day | ORAL | Status: DC
  Administered 2021-04-30: 20 mg via ORAL
  Filled 2021-04-30: qty 1

## 2021-04-30 NOTE — Progress Notes (Signed)
° °  Subjective/Chief Complaint: Doing well Tol Po  BMs   Objective: Vital signs in last 24 hours: Temp:  [98.1 F (36.7 C)-98.6 F (37 C)] 98.1 F (36.7 C) (01/17 0750) Pulse Rate:  [68-72] 69 (01/17 0750) Resp:  [14-16] 16 (01/17 0750) BP: (143-173)/(61-83) 153/61 (01/17 0750) SpO2:  [97 %-100 %] 97 % (01/17 0750) Last BM Date: 04/28/21  Intake/Output from previous day: No intake/output data recorded. Intake/Output this shift: No intake/output data recorded.  PE:  Constitutional: No acute distress, conversant, appears states age. Eyes: Anicteric sclerae, moist conjunctiva, no lid lag Lungs: Clear to auscultation bilaterally, normal respiratory effort CV: regular rate and rhythm, no murmurs, no peripheral edema, pedal pulses 2+ GI: Soft, no masses or hepatosplenomegaly, non-tender to palpation Skin: No rashes, palpation reveals normal turgor Psychiatric: appropriate judgment and insight, oriented to person, place, and time   Lab Results:  Recent Labs    04/29/21 0241 04/30/21 0334  WBC 7.0 6.5  HGB 12.4 11.3*  HCT 40.0 36.4  PLT 194 198   BMET Recent Labs    04/29/21 0241 04/30/21 0334  NA 139 138  K 3.6 3.3*  CL 101 103  CO2 27 27  GLUCOSE 102* 98  BUN 11 8  CREATININE 0.70 0.70  CALCIUM 8.9 8.6*   PT/INR Recent Labs    04/28/21 0702  LABPROT 15.3*  INR 1.2   ABG No results for input(s): PHART, HCO3 in the last 72 hours.  Invalid input(s): PCO2, PO2  Studies/Results: DG Abd Portable 1V-Small Bowel Obstruction Protocol-initial, 8 hr delay  Result Date: 04/28/2021 CLINICAL DATA:  Gastrografin given at 1230, there was miscommunication about when the contrast was given so patient missed their 8 hour delay. EXAM: PORTABLE ABDOMEN - 1 VIEW COMPARISON:  None. FINDINGS: PO contrast reaches the rectum. The bowel gas pattern is normal. No radio-opaque calculi or other significant radiographic abnormality are seen. Enteric tube with tip coursing below  the hemidiaphragm overlying the expected region of the gastric antrum/pyloric region. Partially visualized total right hip arthroplasty. IMPRESSION: 1. PO contrast reaches the rectum. 2. Enteric tube in grossly appropriate position. Electronically Signed   By: Iven Finn M.D.   On: 04/28/2021 23:21    Anti-infectives: Anti-infectives (From admission, onward)    None       Assessment/Plan: 88 yo female with a recent PE on Xarelto, undergoing treatment for recurrent breast cancer, and history of laparotomy, who presents with a small bowel obstruction.   -Doing well, SB protocol with contrast in the colon and return of bowel function - tol soft  -states she's going home today   low decision making.   LOS: 3 days    Ralene Ok 04/30/2021

## 2021-04-30 NOTE — Discharge Instructions (Signed)

## 2021-04-30 NOTE — Progress Notes (Signed)
ANTICOAGULATION CONSULT NOTE- follow-up  Pharmacy Consult for heparin Indication:  hx PE  Allergies  Allergen Reactions   Bactrim Swelling    SWELLING OF MOUTH/FACE.   Lisinopril Swelling    SWELLING OF MOUTH/FACE.   Vasotec Swelling    SWELLING OF MOUTH/FACE.   Sulfamethoxazole-Trimethoprim     Other reaction(s): Unknown    Patient Measurements: Height: 5\' 7"  (170.2 cm) Weight: 93.4 kg (205 lb 14.6 oz) IBW/kg (Calculated) : 61.6 Heparin Dosing Weight: 82kg  Vital Signs: Temp: 98.1 F (36.7 C) (01/17 0750) Temp Source: Oral (01/17 0750) BP: 153/61 (01/17 0750) Pulse Rate: 69 (01/17 0750)  Labs: Recent Labs    04/28/21 0702 04/28/21 1010 04/29/21 0241 04/29/21 0551 04/30/21 0334  HGB 12.8  --  12.4  --  11.3*  HCT 39.8  --  40.0  --  36.4  PLT 256  --  194  --  198  APTT 130* 93*  --  98* 95*  LABPROT 15.3*  --   --   --   --   INR 1.2  --   --   --   --   HEPARINUNFRC 1.10* 0.91*  --  0.87* 0.71*  CREATININE 0.66  --  0.70  --  0.70     Estimated Creatinine Clearance: 59.2 mL/min (by C-G formula based on SCr of 0.7 mg/dL).   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 Pt has hx PE (02/2021) on Xarelto PTA, pharmacy asked to dose IV heparin while unable to give POs. Last Xarelto dose was Thursday. CBC wnl on admit.  aPTT resulted as 95 seconds and heparin level resulted as 0.71 from 03:34 lab draw.    Spoke with patient. She states she only has one limb that phlebotomy can draw labs from which is the same limb the heparin infusion is running through.   Goal of Therapy:  Heparin level 0.3-0.7 units/ml aPTT 66-102 seconds Monitor platelets by anticoagulation protocol: Yes   Plan:  Continue heparin at 1300 units/h Daily PTT/HL/CBC   Vaughan Basta BS, PharmD, BCPS Clinical Pharmacist 04/30/2021 9:35 AM

## 2021-04-30 NOTE — Progress Notes (Signed)
Mobility Specialist Progress Note   04/30/21 1250  Mobility  Activity Ambulated independently in hallway  Level of Assistance Independent  Assistive Device None  Distance Ambulated (ft) 210 ft  Activity Response Tolerated well  $Mobility charge 1 Mobility   Received pt in chair having no complaints and agreeable to mobility. Asymptomatic throughout ambulation, returned back to chair w/ call bell in reach and all needs met.  Holland Falling Mobility Specialist Phone Number [phone]

## 2021-04-30 NOTE — Care Management Important Message (Signed)
Important Message  Patient Details  Name: Tami Schmidt MRN: [id number] Date of Birth: [birth year]   Medicare Important Message Given:  Yes     Hannah Beat 04/30/2021, 11:55 AM

## 2021-04-30 NOTE — Progress Notes (Signed)
Reviewed discharge paperwork with patient. IV d/c. No complaints at this time.waiting for ride.

## 2021-04-30 NOTE — Discharge Summary (Signed)
Physician Discharge Summary  Tami Schmidt FKC:[id number] DOB: [birth year]  PCP: [name], MD  Admitted from: Home Discharged to: Home  Admit date: 04/27/2021 Discharge date: 04/30/2021  Recommendations for Outpatient Follow-up:    Follow-up Information     Ralene Ok, MD. Schedule an appointment as soon as possible for a visit.   Specialty: General Surgery Contact information: [place] [address] [phone]         [name], MD. Schedule an appointment as soon as possible for a visit in 1 week(s).   Specialty: Family Medicine Why: To be seen with repeat labs (CBC & BMP). Contact information: [place] [address] [phone]                  Home Health: None    Equipment/Devices: None    Discharge Condition: Improved and stable   Code Status: Full Code Diet recommendation:  Discharge Diet Orders (From admission, onward)     Start     Ordered   04/30/21 0000  Diet - low sodium heart healthy        04/30/21 1101   04/30/21 0000  Diet Carb Modified        04/30/21 1101             Discharge Diagnoses:  Principal Problem:   SBO (small bowel obstruction) ([place]) Active Problems:   Pulmonary embolism (HCC)   Type 2 diabetes mellitus without complication ([place])   Essential hypertension   Chest wall recurrence of breast cancer ([place])   Hypokalemia   Microcytosis   Brief Summary: 88 year old female with medical history of breast cancer, s/p bilateral mastectomies s/p radiation and undergoing chemotherapy, recently diagnosed with acute pulmonary embolism (04/18/2021) and now on Xarelto, type II DM, HTN, HLD, hysterectomy, appendectomy, hernia repair presented to ED with complaints of 1 to 2 days history of progressive abdominal pain, nausea, vomiting and reduced oral intake.  BM and flatus 2 days PTA.  Admitted for small bowel obstruction with a transition within the  ventral hernia.  General surgery consulted.  SBO resolved with conservative management.       Assessment & Plan:  SBO (small bowel obstruction) (HCC) Prior surgeries as noted above. CT abdomen showed SBO, with a transition within a ventral hernia. General surgery consulted and assisted with management. She was treated conservatively with n.p.o., NG tube decompression, IV fluids, serial clinical and x-ray imaging.  Xarelto was held and transition to IV heparin just in case she did not improve and needed surgery.  She also underwent small bowel protocol on 1/16. With above measures, she clinically improved, had multiple BMs and passing flatus following which her diet was gradually advanced which she has tolerated without difficulty. General surgery have seen her today and are aware that she is being discharged. May follow-up outpatient with general surgery to see if she needs any permanent corrective measures to avoid recurrence of SBO.   Pulmonary embolism ([place]) Diagnosed 04/18/2021. Last dose of Xarelto 04/26/2021.  Xarelto was held briefly and was on IV heparin just in case she may have needed surgery which she did not. Patient will be switched back to Xarelto today prior to discharge.   Type 2 diabetes mellitus without complication (HCC) Treated in the hospital with SSI.  Resume prior home dose of Humulin and metformin at discharge   Essential hypertension Home antihypertensives will held in the hospital while she was n.p.o. when she was treated with IV  hydralazine as needed.  Blood pressure starting to creep up. Resume prior home antihypertensives.   Chest wall recurrence of breast cancer [facility]) Outpatient follow-up with oncology, Dr. Chryl Heck   Hypokalemia Replaced.  Replaced prior to discharge today.  Follow BMP as outpatient.   Magnesium normal.   Microcytosis Hemoglobin is normal.  Mild anemia today.  This could be be dilutional as well.  Needs further evaluation as  outpatient.  Microscopic hematuria Urine microscopy shows 11-20 RBC/hpf.  Lacks urinary symptoms.  May repeat urine microscopy in 2 to 3 weeks and if this persist then may consider further evaluation.   Body mass index is 32.25 kg/m.        Consultants:   General surgery   Procedures:   NG tube-discontinue   Discharge Instructions  Discharge Instructions     Call MD for:  difficulty breathing, headache or visual disturbances   Complete by: As directed    Call MD for:  extreme fatigue   Complete by: As directed    Call MD for:  persistant dizziness or light-headedness   Complete by: As directed    Call MD for:  persistant nausea and vomiting   Complete by: As directed    Call MD for:  severe uncontrolled pain   Complete by: As directed    Call MD for:  temperature >100.4   Complete by: As directed    Diet - low sodium heart healthy   Complete by: As directed    Diet Carb Modified   Complete by: As directed    Increase activity slowly   Complete by: As directed         Medication List     STOP taking these medications    benzonatate 100 MG capsule Commonly known as: TESSALON       TAKE these medications    amLODipine 10 MG tablet Commonly known as: NORVASC Take 10 mg by mouth daily with breakfast.   diclofenac Sodium 1 % Gel Commonly known as: VOLTAREN Apply 2 g topically daily as needed (For topical arthritis.).   HumuLIN N 100 UNIT/ML injection Generic drug: insulin NPH Human Inject 30 Units into the skin daily.   metFORMIN 500 MG tablet Commonly known as: GLUCOPHAGE Take 500 mg by mouth daily with breakfast.   olmesartan-hydrochlorothiazide 40-12.5 MG tablet Commonly known as: BENICAR HCT Take 1 tablet by mouth daily.   QC TUMERIC COMPLEX PO Take 1 tablet by mouth daily.   rivaroxaban 20 MG Tabs tablet Commonly known as: XARELTO Take 1 tablet (20 mg total) by mouth daily with supper.   SM Iron 325 (65 FE) MG tablet Generic drug:  ferrous sulfate Take 325 mg by mouth daily.   Tylenol 325 MG tablet Generic drug: acetaminophen Take 325 mg by mouth daily.   VITAMIN D PO Take 1 tablet by mouth daily.       Allergies  Allergen Reactions   Bactrim Swelling    SWELLING OF MOUTH/FACE.   Lisinopril Swelling    SWELLING OF MOUTH/FACE.   Vasotec Swelling    SWELLING OF MOUTH/FACE.   Sulfamethoxazole-Trimethoprim     Other reaction(s): Unknown      Procedures/Studies: DG Abd 1 View  Result Date: 04/28/2021 CLINICAL DATA:  Check nasogastric catheter placement EXAM: ABDOMEN - 1 VIEW COMPARISON:  Film from earlier in the same day. FINDINGS: Gastric catheter has been advanced and now lies within the stomach. No obstructive changes are seen at this time. No free air is noted. IMPRESSION:  Gastric catheter now lies within the stomach in appropriate position. Electronically Signed   By: Inez Catalina M.D.   On: 04/28/2021 00:12   DG Abd 1 View  Result Date: 04/27/2021 CLINICAL DATA:  NGT placement. EXAM: ABDOMEN - 1 VIEW COMPARISON:  CT with IV contrast earlier today. FINDINGS: NGT is in place with the tip just below the hiatus and the side hole in the distal esophagus and needs to be advanced further in 12-15 cm for better positioning. Imaging includes the upper to mid abdomen excluding the true pelvis. The bowel pattern again demonstrating evidence of small-bowel obstruction with small bowel dilatation up to 3.6 cm. There is no supine evidence of free air. Visceral shadows are stable; no appreciable pathologic calcification. Cardiomegaly. IMPRESSION: 1. Enteric tube should be advanced further in 12-15 cm. 2. Small bowel dilatation is unchanged. Electronically Signed   By: Telford Nab M.D.   On: 04/27/2021 22:57   CT ABDOMEN PELVIS W CONTRAST  Result Date: 04/27/2021 CLINICAL DATA:  Abdominal pain, acute, nonlocalized. Nausea and vomiting EXAM: CT ABDOMEN AND PELVIS WITH CONTRAST TECHNIQUE: Multidetector CT imaging of the  abdomen and pelvis was performed using the standard protocol following bolus administration of intravenous contrast. RADIATION DOSE REDUCTION: This exam was performed according to the departmental dose-optimization program which includes automated exposure control, adjustment of the mA and/or kV according to patient size and/or use of iterative reconstruction technique. CONTRAST:  140mL OMNIPAQUE IOHEXOL 300 MG/ML  SOLN COMPARISON:  06/16/2012 FINDINGS: Lower chest: Cardiomegaly.  Included lung bases are clear. Hepatobiliary: No focal liver abnormality. There are a few small layering stones within the gallbladder lumen. No pericholecystic inflammatory changes by CT. No biliary dilatation. Pancreas: Unremarkable. No pancreatic ductal dilatation or surrounding inflammatory changes. Spleen: Normal in size without focal abnormality. Adrenals/Urinary Tract: Adrenal glands within normal limits. Multiple subcentimeter low-density lesions within both kidneys, too small to definitively characterized. No renal stone or hydronephrosis. Urinary bladder within normal limits for the degree of distension. Stomach/Bowel: Small hiatal hernia. Stomach otherwise within normal limits. Small-bowel obstruction with transition point located within patient's inferior ventral abdominal wall hernia (series 5, image 84). Fecalization of small bowel content upstream from the site of obstruction. The small bowel and colon distal to the site of obstruction is decompressed. Extensive diverticulosis, most pronounced within the sigmoid colon Vascular/Lymphatic: Aortic atherosclerosis. No enlarged abdominal or pelvic lymph nodes. Reproductive: Status post hysterectomy. No adnexal masses. Other: Inferior ventral abdominal wall hernia with scarring along the anteroinferior margin of the hernia sac. No ascites. No organized abdominopelvic fluid collection. No pneumoperitoneum. Musculoskeletal: Partially visualized right hip arthroplasty. Advanced  degenerative changes of the pubic symphysis with small joint effusion, nonspecific. Advanced degenerative disc disease and facet arthropathy of the lumbar spine. Multilevel bridging endplate osteophytes within the lower thoracic spine. IMPRESSION: 1. Small-bowel obstruction with transition point located within patient's inferior ventral abdominal wall hernia. 2. Extensive colonic diverticulosis without evidence of acute diverticulitis. 3. Cholelithiasis without evidence of acute cholecystitis. Aortic Atherosclerosis (ICD10-I70.0). Electronically Signed   By: Davina Poke D.O.   On: 04/27/2021 15:22   DG Abd Portable 1V-Small Bowel Obstruction Protocol-initial, 8 hr delay  Result Date: 04/28/2021 CLINICAL DATA:  Gastrografin given at 1230, there was miscommunication about when the contrast was given so patient missed their 8 hour delay. EXAM: PORTABLE ABDOMEN - 1 VIEW COMPARISON:  None. FINDINGS: PO contrast reaches the rectum. The bowel gas pattern is normal. No radio-opaque calculi or other significant radiographic abnormality are seen. Enteric  tube with tip coursing below the hemidiaphragm overlying the expected region of the gastric antrum/pyloric region. Partially visualized total right hip arthroplasty. IMPRESSION: 1. PO contrast reaches the rectum. 2. Enteric tube in grossly appropriate position. Electronically Signed   By: Iven Finn M.D.   On: 04/28/2021 23:21    Subjective: Denies complaints.  Had a BM at around 1 PM yesterday.  No abdominal pain.  Tolerating regular consistency diet.  No nausea or vomiting.  Has been ambulating in the room without difficulty.  Discharge Exam:  Vitals:   04/29/21 2013 04/29/21 2317 04/30/21 0330 04/30/21 0750  BP: (!) 162/73 (!) 143/65 (!) 173/83 (!) 153/61  Pulse: 72 72 68 69  Resp: 14 14 14 16   Temp: 98.2 F (36.8 C) 98.6 F (37 C) 98.4 F (36.9 C) 98.1 F (36.7 C)  TempSrc:   Oral Oral  SpO2: 98% 100% 99% 97%  Weight:      Height:         General exam: Elderly female, moderately built and obese lying comfortably propped up in bed without distress.  Sitting up at edge of bed eating breakfast. Respiratory system: Clear to auscultation.  No increased work of breathing. Cardiovascular system: S1 & S2 heard, RRR. No JVD, murmurs, rubs, gallops or clicks. No pedal edema. Gastrointestinal system: Abdomen is nondistended, soft and nontender.  Normal bowel sounds heard.. Central nervous system: Alert and oriented. No focal neurological deficits. Extremities: Symmetric 5 x 5 power. Skin: No rashes, lesions or ulcers Psychiatry: Judgement and insight appear normal. Mood & affect appropriate.     The results of significant diagnostics from this hospitalization (including imaging, microbiology, ancillary and laboratory) are listed below for reference.     Microbiology: Recent Results (from the past 240 hour(s))  Resp Panel by RT-PCR (Flu A&B, Covid) Nasopharyngeal Swab     Status: None   Collection Time: 04/27/21  5:31 PM   Specimen: Nasopharyngeal Swab; Nasopharyngeal(NP) swabs in vial transport medium  Result Value Ref Range Status   SARS Coronavirus 2 by RT PCR NEGATIVE NEGATIVE Final    Comment: (NOTE) SARS-CoV-2 target nucleic acids are NOT DETECTED.  The SARS-CoV-2 RNA is generally detectable in upper respiratory specimens during the acute phase of infection. The lowest concentration of SARS-CoV-2 viral copies this assay can detect is 138 copies/mL. A negative result does not preclude SARS-Cov-2 infection and should not be used as the sole basis for treatment or other patient management decisions. A negative result may occur with  improper specimen collection/handling, submission of specimen other than nasopharyngeal swab, presence of viral mutation(s) within the areas targeted by this assay, and inadequate number of viral copies(<138 copies/mL). A negative result must be combined with clinical observations, patient  history, and epidemiological information. The expected result is Negative.  Fact Sheet for Patients:  EntrepreneurPulse.com.au  Fact Sheet for Healthcare Providers:  IncredibleEmployment.be  This test is no t yet approved or cleared by the [place] FDA and  has been authorized for detection and/or diagnosis of SARS-CoV-2 by FDA under an Emergency Use Authorization (EUA). This EUA will remain  in effect (meaning this test can be used) for the duration of the COVID-19 declaration under Section 564(b)(1) of the Act, 21 U.S.C.section 360bbb-3(b)(1), unless the authorization is terminated  or revoked sooner.       Influenza A by PCR NEGATIVE NEGATIVE Final   Influenza B by PCR NEGATIVE NEGATIVE Final    Comment: (NOTE) The Xpert Xpress SARS-CoV-2/FLU/RSV plus assay is intended  as an aid in the diagnosis of influenza from Nasopharyngeal swab specimens and should not be used as a sole basis for treatment. Nasal washings and aspirates are unacceptable for Xpert Xpress SARS-CoV-2/FLU/RSV testing.  Fact Sheet for Patients: EntrepreneurPulse.com.au  Fact Sheet for Healthcare Providers: IncredibleEmployment.be  This test is not yet approved or cleared by the [place] FDA and has been authorized for detection and/or diagnosis of SARS-CoV-2 by FDA under an Emergency Use Authorization (EUA). This EUA will remain in effect (meaning this test can be used) for the duration of the COVID-19 declaration under Section 564(b)(1) of the Act, 21 U.S.C. section 360bbb-3(b)(1), unless the authorization is terminated or revoked.  Performed at [facility], [address], [place], [place] [address]      Labs: CBC: Recent Labs  Lab 04/27/21 1336 04/28/21 0702 04/29/21 0241 04/30/21 0334  WBC 8.6 7.2 7.0 6.5  NEUTROABS  --  5.0  --   --   HGB 13.1 12.8 12.4 11.3*  HCT 41.4 39.8 40.0 36.4   MCV 77.5* 77.7* 79.2* 80.2  PLT 273 256 194 387    Basic Metabolic Panel: Recent Labs  Lab 04/27/21 1336 04/28/21 0702 04/29/21 0241 04/30/21 0334  NA 134* 138 139 138  K 3.5 3.1* 3.6 3.3*  CL 97* 100 101 103  CO2 26 26 27 27   GLUCOSE 206* 130* 102* 98  BUN 14 11 11 8   CREATININE 0.57 0.66 0.70 0.70  CALCIUM 10.0 8.7* 8.9 8.6*  MG  --  1.9  --   --     Liver Function Tests: Recent Labs  Lab 04/27/21 1336 04/28/21 0702  AST 14* 14*  ALT 7 8  ALKPHOS 58 55  BILITOT 0.7 0.9  PROT 8.0 6.2*  ALBUMIN 4.5 3.4*    CBG: Recent Labs  Lab 04/29/21 0607 04/29/21 1154 04/29/21 1730 04/30/21 0023 04/30/21 0659  GLUCAP 99 123* 125* 116* 104*     Urinalysis    Component Value Date/Time   COLORURINE YELLOW 04/27/2021 1336   APPEARANCEUR CLEAR 04/27/2021 1336   LABSPEC 1.013 04/27/2021 1336   PHURINE 7.0 04/27/2021 1336   GLUCOSEU >1,000 (A) 04/27/2021 1336   HGBUR SMALL (A) 04/27/2021 1336   BILIRUBINUR NEGATIVE 04/27/2021 1336   KETONESUR 40 (A) 04/27/2021 1336   PROTEINUR 100 (A) 04/27/2021 1336   UROBILINOGEN 0.2 04/26/2010 0840   NITRITE NEGATIVE 04/27/2021 1336   LEUKOCYTESUR NEGATIVE 04/27/2021 1336    Discussed in detail with patient spouse at bedside, updated care and answered all questions  Time coordinating discharge: 25 minutes  SIGNED:  Vernell Leep, MD,  FACP, [facility], [facility], [facility] (Care Management Physician Certified). [facility]  To contact the attending provider between 7A-7P or the covering provider during after hours 7P-7A, please log into the web site [email or web address] and access using universal [place] password for that web site. If you do not have the password, please call the hospital operator.

## 2021-04-30 NOTE — Progress Notes (Signed)
ANTICOAGULATION CONSULT NOTE - Initial Consult  Pharmacy Consult for transitioning from heparin infusion back to home Xarelto Indication: pulmonary embolus  Allergies  Allergen Reactions   Bactrim Swelling    SWELLING OF MOUTH/FACE.   Lisinopril Swelling    SWELLING OF MOUTH/FACE.   Vasotec Swelling    SWELLING OF MOUTH/FACE.   Sulfamethoxazole-Trimethoprim     Other reaction(s): Unknown    Patient Measurements: Height: 5\' 7"  (170.2 cm) Weight: 93.4 kg (205 lb 14.6 oz) IBW/kg (Calculated) : 61.6   Vital Signs: Temp: 98.1 F (36.7 C) (01/17 0750) Temp Source: Oral (01/17 0750) BP: 153/61 (01/17 0750) Pulse Rate: 69 (01/17 0750)  Labs: Recent Labs    04/28/21 0702 04/28/21 1010 04/29/21 0241 04/29/21 0551 04/30/21 0334  HGB 12.8  --  12.4  --  11.3*  HCT 39.8  --  40.0  --  36.4  PLT 256  --  194  --  198  APTT 130* 93*  --  98* 95*  LABPROT 15.3*  --   --   --   --   INR 1.2  --   --   --   --   HEPARINUNFRC 1.10* 0.91*  --  0.87* 0.71*  CREATININE 0.66  --  0.70  --  0.70    Estimated Creatinine Clearance: 59.2 mL/min (by C-G formula based on SCr of 0.7 mg/dL).   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    Past Week   diclofenac Sodium (VOLTAREN) 1 % GEL Apply 2 g topically daily as needed (For topical  arthritis.).   Past Month   HUMULIN N 100 UNIT/ML injection Inject 30 Units into the skin daily.   Past Week   metFORMIN (GLUCOPHAGE) 500 MG tablet Take 500 mg by mouth daily with breakfast.   Past Week   olmesartan-hydrochlorothiazide (BENICAR HCT) 40-12.5 MG tablet Take 1 tablet by mouth daily.   Past Week   rivaroxaban (XARELTO) 20 MG TABS tablet Take 1 tablet (20 mg total) by mouth daily with supper. 90 tablet 3 Past Week   SM IRON 325 (65 Fe) MG tablet Take 325 mg by mouth daily.   Past Week   Turmeric (QC TUMERIC COMPLEX PO) Take 1 tablet by mouth daily.   Past Week   TYLENOL 325 MG tablet Take 325 mg by mouth daily.   Past Week   VITAMIN D PO Take 1 tablet by mouth daily.   Past Week   benzonatate (TESSALON) 100 MG capsule Take 1 capsule (100 mg total) by mouth 3 (three) times daily as needed for cough. (Patient not taking: Reported on 04/28/2021) 20 capsule 0 Not Taking    Assessment: Discontinue heparin infusion and restart this patient's home dose of  Xarelto Goal of Therapy:  Monitor platelets by anticoagulation protocol: Yes   Plan:  Xarelto 20 mg po daily after supper as she was on at home prior to this current admission.   Ranesha Val BS, PharmD, BCPS Clinical Pharmacist 04/30/2021,10:58 AM

## 2021-05-03 ENCOUNTER — Encounter: Payer: Self-pay | Admitting: *Deleted

## 2021-05-06 ENCOUNTER — Encounter (HOSPITAL_COMMUNITY): Payer: Medicare Other

## 2021-05-06 ENCOUNTER — Ambulatory Visit (HOSPITAL_COMMUNITY)
Admission: RE | Admit: 2021-05-06 | Discharge: 2021-05-06 | Disposition: A | Discharge status: Home / Self Care | Payer: Medicare Other | Source: Ambulatory Visit | Attending: Hematology and Oncology | Admitting: Hematology and Oncology

## 2021-05-06 ENCOUNTER — Encounter (HOSPITAL_COMMUNITY)
Admission: RE | Admit: 2021-05-06 | Discharge: 2021-05-06 | Disposition: A | Discharge status: Home / Self Care | Payer: Medicare Other | Source: Ambulatory Visit | Attending: Hematology and Oncology | Admitting: Hematology and Oncology

## 2021-05-06 ENCOUNTER — Other Ambulatory Visit: Payer: Self-pay

## 2021-05-06 ENCOUNTER — Ambulatory Visit (HOSPITAL_COMMUNITY)
Admission: RE | Admit: 2021-05-06 | Discharge: 2021-05-06 | Disposition: A | Discharge status: Home / Self Care | Payer: Medicare Other | Source: Ambulatory Visit | Attending: Oncology | Admitting: Oncology

## 2021-05-06 ENCOUNTER — Ambulatory Visit (HOSPITAL_BASED_OUTPATIENT_CLINIC_OR_DEPARTMENT_OTHER)
Admission: RE | Admit: 2021-05-06 | Discharge: 2021-05-06 | Disposition: A | Discharge status: Home / Self Care | Payer: Medicare Other | Source: Ambulatory Visit | Attending: Hematology and Oncology | Admitting: Hematology and Oncology

## 2021-05-06 ENCOUNTER — Other Ambulatory Visit: Payer: Self-pay | Admitting: *Deleted

## 2021-05-06 DIAGNOSIS — C7989 Secondary malignant neoplasm of other specified sites: Secondary | ICD-10-CM | POA: Diagnosis not present

## 2021-05-06 DIAGNOSIS — E119 Type 2 diabetes mellitus without complications: Secondary | ICD-10-CM | POA: Insufficient documentation

## 2021-05-06 DIAGNOSIS — I700 Atherosclerosis of aorta: Secondary | ICD-10-CM | POA: Insufficient documentation

## 2021-05-06 DIAGNOSIS — C50919 Malignant neoplasm of unspecified site of unspecified female breast: Secondary | ICD-10-CM | POA: Diagnosis not present

## 2021-05-06 DIAGNOSIS — I10 Essential (primary) hypertension: Secondary | ICD-10-CM | POA: Diagnosis not present

## 2021-05-06 DIAGNOSIS — R222 Localized swelling, mass and lump, trunk: Secondary | ICD-10-CM

## 2021-05-06 DIAGNOSIS — I2510 Atherosclerotic heart disease of native coronary artery without angina pectoris: Secondary | ICD-10-CM | POA: Insufficient documentation

## 2021-05-06 DIAGNOSIS — Z0189 Encounter for other specified special examinations: Secondary | ICD-10-CM

## 2021-05-06 DIAGNOSIS — Z170 Estrogen receptor positive status [ER+]: Secondary | ICD-10-CM

## 2021-05-06 DIAGNOSIS — K760 Fatty (change of) liver, not elsewhere classified: Secondary | ICD-10-CM | POA: Diagnosis not present

## 2021-05-06 DIAGNOSIS — C50312 Malignant neoplasm of lower-inner quadrant of left female breast: Secondary | ICD-10-CM

## 2021-05-06 DIAGNOSIS — K8020 Calculus of gallbladder without cholecystitis without obstruction: Secondary | ICD-10-CM | POA: Diagnosis not present

## 2021-05-06 DIAGNOSIS — J439 Emphysema, unspecified: Secondary | ICD-10-CM | POA: Insufficient documentation

## 2021-05-06 LAB — ECHOCARDIOGRAM COMPLETE
AR max vel: 2.84 cm2
AV Peak grad: 8.9 mmHg
Ao pk vel: 1.5 m/s
Area-P 1/2: 3.23 cm2
S' Lateral: 2.6 cm
Single Plane A4C EF: 54.7 %

## 2021-05-06 MED ORDER — TECHNETIUM TC 99M MEDRONATE IV KIT
20.0000 | PACK | Freq: Once | INTRAVENOUS | Status: AC | PRN
  Administered 2021-05-06: 20.5 via INTRAVENOUS

## 2021-05-06 MED ORDER — IOHEXOL 300 MG/ML  SOLN
100.0000 mL | Freq: Once | INTRAMUSCULAR | Status: AC | PRN
  Administered 2021-05-06: 100 mL via INTRAVENOUS

## 2021-05-06 MED ORDER — SODIUM CHLORIDE (PF) 0.9 % IJ SOLN
INTRAMUSCULAR | Status: AC
  Filled 2021-05-06: qty 50

## 2021-05-06 NOTE — Progress Notes (Signed)
Echocardiogram 2D Echocardiogram has been performed.  Tami Schmidt 05/06/2021, 10:26 AM

## 2021-05-08 ENCOUNTER — Inpatient Hospital Stay: Payer: Medicare Other

## 2021-05-08 ENCOUNTER — Other Ambulatory Visit: Payer: Self-pay | Admitting: *Deleted

## 2021-05-08 ENCOUNTER — Telehealth: Payer: Self-pay | Admitting: Genetic Counselor

## 2021-05-08 ENCOUNTER — Inpatient Hospital Stay: Payer: Medicare Other | Admitting: Hematology and Oncology

## 2021-05-08 ENCOUNTER — Other Ambulatory Visit: Payer: Self-pay

## 2021-05-08 ENCOUNTER — Encounter: Payer: Self-pay | Admitting: Hematology and Oncology

## 2021-05-08 VITALS — BP 165/63 | HR 92 | Temp 97.3°F | Resp 18 | Ht 67.0 in | Wt 208.5 lb

## 2021-05-08 DIAGNOSIS — C50919 Malignant neoplasm of unspecified site of unspecified female breast: Secondary | ICD-10-CM

## 2021-05-08 DIAGNOSIS — I2693 Single subsegmental pulmonary embolism without acute cor pulmonale: Secondary | ICD-10-CM

## 2021-05-08 DIAGNOSIS — Z170 Estrogen receptor positive status [ER+]: Secondary | ICD-10-CM

## 2021-05-08 DIAGNOSIS — Z5111 Encounter for antineoplastic chemotherapy: Secondary | ICD-10-CM | POA: Diagnosis present

## 2021-05-08 DIAGNOSIS — I890 Lymphedema, not elsewhere classified: Secondary | ICD-10-CM

## 2021-05-08 DIAGNOSIS — C50312 Malignant neoplasm of lower-inner quadrant of left female breast: Secondary | ICD-10-CM

## 2021-05-08 DIAGNOSIS — C7989 Secondary malignant neoplasm of other specified sites: Secondary | ICD-10-CM | POA: Diagnosis not present

## 2021-05-08 DIAGNOSIS — C773 Secondary and unspecified malignant neoplasm of axilla and upper limb lymph nodes: Secondary | ICD-10-CM | POA: Diagnosis present

## 2021-05-08 MED ORDER — FULVESTRANT 250 MG/5ML IM SOSY
500.0000 mg | PREFILLED_SYRINGE | Freq: Once | INTRAMUSCULAR | Status: AC
  Administered 2021-05-08: 15:00:00 500 mg via INTRAMUSCULAR
  Filled 2021-05-08: qty 10

## 2021-05-08 NOTE — Progress Notes (Signed)
ID: Tami Schmidt OB: 1932/09/19  MR#: [id number]  CSN#:[id number]  Patient Care Team: [name], MD as PCP - General (Family Medicine) Gentry Fitz, MD as Consulting Physician (Family Medicine) Rockwell Germany, RN as Oncology Nurse Navigator Tressie Ellis, Paulette Blanch, RN as Oncology Nurse Navigator Benay Pike, MD as Consulting Physician (Hematology and Oncology) OTHER MD:   CHIEF COMPLAINT:  bilateral breast cancersadenopathy  CURRENT TREATMENT: Rivaroxaban; work-up in progress   INTERVAL HISTORY: Tami Schmidt returns today for follow up of her history of breast cancer and new left arm lymphedema.    CT of the chest with contrast 03/06/2021 showed nonocclusive segmental and subsegmental pulmonary emboli in the right upper lobe.  She was started on rivaroxaban and is tolerating that well so far, with no bleeding or bruising that she is aware of.  The chest CT also showed left axillary and subpectoral lymphadenopathy, explaining the new onset of left upper extremity lymphedema.  This was followed by left breast and axilla ultrasound on 03/29/2021 showing: two stable masses in infraclavicular subcutaneous tissues most consistent with fat necrosis and stable; a third lesion has similar characteristics also in infraclavicular subcutaneous region, possibly representing an additional focus of fat necrosis; however there are multiple retropectoral, interpectoral and left axillary masses suspicious for metastatic disease.  Biopsy of axillary mass showed ER positive PR positive, HER2 amplified invasive ductal carcinoma, grade 3 of 3 She is here to discuss our treatment recommendations.  Since last visit, she tells me that her lymphedema continues to bother her.  She has also noticed new onset left lower back pain about 10 days ago.  Other than that her knees bother her which is a chronic issue.  She denies any changes in breathing, bowel habits or urinary habits.  No new neurological  complaints.  Rest of the pertinent 10 point ROS reviewed and negative.  COVID 19 VACCINATION STATUS:    BREAST CANCER HISTORY: From the earlier summary:  Tami Schmidt's history of breast cancer dates back to 1993, when she underwent biopsy of her right breast in [place]. I do not have those details, but she moved to [place] shortly thereafter and it was in [place] where she had her right lumpectomy and axillary lymph node dissection. She had a total of 6 lymph nodes removed, she tells me, and all of them were clear. She does not recall the size of the primary. She was treated with adjuvant radiation and then received tamoxifen between 1993 and 1998.  In 2002 she had a local recurrence in the right breast, and underwent right mastectomy. She received tamoxifen between 2002 and 2007.  In 2004 she underwent a left mastectomy and sentinel lymph node sampling for a 9 mm mucinous carcinoma, grade 2, multifocal, associated with ductal carcinoma in situ. The single sentinel lymph node was negative. Margins were ample. She did not receive adjuvant radiation, but continued on tamoxifen as noted above, until 2007.  More recently, in July of 2014, the patient noted a "pimple" in the left chest wall, superior to her prior mastectomy incision. She brought this to the attention of her surgeon, Dr. Ninfa Linden and he describes a dimple area with a firm mass underneath. He also describes a large lipoma in the left clavicular area and a second one on her abdominal wall. Both of these were symptomatic. All 3 masses were removed 01/26/2013. The pathology from this procedure ([facility] [id number]) showed, in the chest wall, and invasive ductal carcinoma measuring grossly 1.3 cm. The lesion abutted the  closest inked resection margin. The other 2 masses proved to be lipomas.  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   Diabetes mellitus    fasting 90-100   HTN (hypertension) 03/25/2013   Hx of radiation therapy    breasts hx   Hypercholesterolemia    Hypertension    Lymphedema of arm    left arm   Pulmonary embolism ([place])    Type II or unspecified type diabetes mellitus without mention of complication, not stated as uncontrolled 03/25/2013    PAST SURGICAL HISTORY: Past Surgical History:  Procedure Laterality Date   ABDOMINAL HYSTERECTOMY     ANKLE SURGERY Right 1995   APPENDECTOMY     BIOPSY  01/07/2018   Procedure: BIOPSY;  Surgeon: [name], MD;  Location: [facility];  Service: Gastroenterology;;   BREAST SURGERY Bilateral    mastectomy   COLONOSCOPY WITH PROPOFOL N/A 01/07/2018   Procedure: COLONOSCOPY WITH PROPOFOL;  Surgeon: [name], MD;  Location: [facility];  Service: Gastroenterology;  Laterality: N/A;   ESOPHAGOGASTRODUODENOSCOPY (EGD) WITH PROPOFOL N/A 01/06/2018   Procedure: ESOPHAGOGASTRODUODENOSCOPY (EGD) WITH PROPOFOL;  Surgeon: [name], MD;  [facility];  Service: Gastroenterology;  Laterality: N/A;   HERNIA REPAIR  04-26-2010   MASS EXCISION Left 01/26/2013   Procedure: EXCISION LEFT CHEST WALL MASS AND LEFT ABDOMNAL WALL MASS;  Surgeon: Harl Bowie, MD;  Location: [facility];  Service: General;  Laterality: Left;   MASS EXCISION Left 09/20/2014   Procedure: EXCISION OF LEFT CHEST WALL MASS;  Surgeon: Coralie Keens, MD;  Location: [facility];  Service: General;  Laterality: Left;   TEE WITHOUT CARDIOVERSION N/A 12/08/2017   Procedure: TRANSESOPHAGEAL ECHOCARDIOGRAM (TEE);  Surgeon: Lelon Perla, MD;  Location: [facility];  Service: Cardiovascular;  Laterality: N/A;   TOTAL HIP ARTHROPLASTY Right 01/05/2017   TOTAL HIP ARTHROPLASTY Right 01/05/2017   Procedure: TOTAL HIP ARTHROPLASTY ANTERIOR APPROACH;  Surgeon: Frederik Pear, MD;   Location: [facility];  Service: Orthopedics;  Laterality: Right;    FAMILY HISTORY The patient's father died from a heart attack at the age of 30. The patient's mother died at the age of 16 from myocarditis. The patient had no brothers, one sister. There is no history of breast or ovarian cancer in the family to her knowledge   GYNECOLOGIC HISTORY:   (Reviewed 09/22/2013) Menarche age 22, first live birth age 25, the patient is Tami Schmidt. She underwent hysterectomy in 1984. She did not use hormone replacement.   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 Her husband Radiographer, therapeutic used to work in Radio producer but is now retired. Their children are: Reed Breech who lives in [place] and teaches theater there; Julius Bowels who works as a Social worker in [place]; Archivist who lives in [place] and is a "radio personality"; Elta Guadeloupe, who is a Biomedical scientist in [place], and Legrand Como who is a musician living in [place]. The patient has 2 grandchildren. She attends a [employer]    ADVANCED DIRECTIVES: Not in place   HEALTH MAINTENANCE:  Social History   Tobacco Use   Smoking status: Former    Types: Cigarettes    Quit date: 04/14/1972    Years since  quitting: 49.0   Smokeless tobacco: Never  Vaping Use   Vaping Use: Never used  Substance Use Topics   Alcohol use: Yes    Comment: occasional   Drug use: Yes    Colonoscopy: 2011  PAP: Status post hysterectomy  Bone density: 1993 ; repeat 12/27/2013 showed osteopenia with a T score of -1.8  Lipid panel: not on file  Allergies  Allergen Reactions   Bactrim Swelling    SWELLING OF MOUTH/FACE.   Lisinopril Swelling    SWELLING OF MOUTH/FACE.   Vasotec Swelling    SWELLING OF MOUTH/FACE.   Sulfamethoxazole-Trimethoprim     Other reaction(s): Unknown    Current Outpatient Medications  Medication Sig Dispense Refill   amLODipine (NORVASC) 10 MG tablet Take 10 mg by mouth daily with breakfast.      diclofenac Sodium  (VOLTAREN) 1 % GEL Apply 2 g topically daily as needed (For topical arthritis.).     HUMULIN N 100 UNIT/ML injection Inject 30 Units into the skin daily.     metFORMIN (GLUCOPHAGE) 500 MG tablet Take 500 mg by mouth daily with breakfast.     olmesartan-hydrochlorothiazide (BENICAR HCT) 40-12.5 MG tablet Take 1 tablet by mouth daily.     rivaroxaban (XARELTO) 20 MG TABS tablet Take 1 tablet (20 mg total) by mouth daily with supper. 90 tablet 3   SM IRON 325 (65 Fe) MG tablet Take 325 mg by mouth daily.     Turmeric (QC TUMERIC COMPLEX PO) Take 1 tablet by mouth daily.     TYLENOL 325 MG tablet Take 325 mg by mouth daily.     VITAMIN D PO Take 1 tablet by mouth daily.     No current facility-administered medications for this visit.    OBJECTIVE: African-American woman who appears stated age  89:   05/08/21 1418  BP: (!) 165/63  Pulse: 92  Resp: 18  Temp: (!) 97.3 F (36.3 C)  SpO2: 100%     Body mass index is 32.66 kg/m.    ECOG FS: 2 Filed Weights   05/08/21 1418  Weight: 208 lb 8 oz (94.6 kg)   Sclerae unicteric, EOMs intact Wearing a mask She has small palpable 1/3-1 /2 centimeter spots along the left clavicular area, which are unchanged from baseline Lungs no rales or rhonchi Heart regular rate and rhythm Abd soft, obese, nontender, positive bowel sounds MSK no focal spinal tenderness, left upper extremity lymphedema with sleeve in place Neuro: nonfocal, well oriented, appropriate affect Breasts: Status post bilateral mastectomies.  I do not feel palpable adenopathy in the left axilla.   LAB RESULTS:  Lab Results  Component Value Date   WBC 6.5 04/30/2021   NEUTROABS 5.0 04/28/2021   HGB 11.3 (L) 04/30/2021   HCT 36.4 04/30/2021   MCV 80.2 04/30/2021   PLT 198 04/30/2021      Chemistry      Component Value Date/Time   NA 138 04/30/2021 0334   NA 139 01/20/2017 1057   K 3.3 (L) 04/30/2021 0334   K 3.9 01/20/2017 1057   CL 103 04/30/2021 0334   CO2 27  04/30/2021 0334   CO2 25 01/20/2017 1057   BUN 8 04/30/2021 0334   BUN 15.6 01/20/2017 1057   CREATININE 0.70 04/30/2021 0334   CREATININE 0.74 04/18/2021 1023   CREATININE 0.7 01/20/2017 1057      Component Value Date/Time   CALCIUM 8.6 (L) 04/30/2021 0334   CALCIUM 9.3 01/20/2017 1057   ALKPHOS 55  04/28/2021 0702   ALKPHOS 69 01/20/2017 1057   AST 14 (L) 04/28/2021 0702   AST 10 (L) 04/18/2021 1023   AST 10 01/20/2017 1057   ALT 8 04/28/2021 0702   ALT 5 04/18/2021 1023   ALT 14 01/20/2017 1057   BILITOT 0.9 04/28/2021 0702   BILITOT 0.5 04/18/2021 1023   BILITOT 0.43 01/20/2017 1057      STUDIES: DG Abd 1 View  Result Date: 04/28/2021 CLINICAL DATA:  Check nasogastric catheter placement EXAM: ABDOMEN - 1 VIEW COMPARISON:  Film from earlier in the same day. FINDINGS: Gastric catheter has been advanced and now lies within the stomach. No obstructive changes are seen at this time. No free air is noted. IMPRESSION: Gastric catheter now lies within the stomach in appropriate position. Electronically Signed   By: Inez Catalina M.D.   On: 04/28/2021 00:12   DG Abd 1 View  Result Date: 04/27/2021 CLINICAL DATA:  NGT placement. EXAM: ABDOMEN - 1 VIEW COMPARISON:  CT with IV contrast earlier today. FINDINGS: NGT is in place with the tip just below the hiatus and the side hole in the distal esophagus and needs to be advanced further in 12-15 cm for better positioning. Imaging includes the upper to mid abdomen excluding the true pelvis. The bowel pattern again demonstrating evidence of small-bowel obstruction with small bowel dilatation up to 3.6 cm. There is no supine evidence of free air. Visceral shadows are stable; no appreciable pathologic calcification. Cardiomegaly. IMPRESSION: 1. Enteric tube should be advanced further in 12-15 cm. 2. Small bowel dilatation is unchanged. Electronically Signed   By: Telford Nab M.D.   On: 04/27/2021 22:57   CT Chest W Contrast  Result Date:  05/07/2021 CLINICAL DATA:  Breast cancer, ongoing chemotherapy. EXAM: CT CHEST, ABDOMEN, AND PELVIS WITH CONTRAST TECHNIQUE: Multidetector CT imaging of the chest, abdomen and pelvis was performed following the standard protocol during bolus administration of intravenous contrast. RADIATION DOSE REDUCTION: This exam was performed according to the departmental dose-optimization program which includes automated exposure control, adjustment of the mA and/or kV according to patient size and/or use of iterative reconstruction technique. CONTRAST:  161m OMNIPAQUE IOHEXOL 300 MG/ML  SOLN COMPARISON:  03/05/2021. FINDINGS: CT CHEST FINDINGS Cardiovascular: Atherosclerotic calcification of the aorta, aortic valve and coronary arteries. Pulmonic trunk and heart are enlarged. No pericardial effusion. Mediastinum/Nodes: 2.4 cm low-attenuation left thyroid nodule. No follow-up recommended unless clinically warranted. (Ref: J Am Coll Radiol. 2015 Feb;12(2): 143-50). 10 mm left supraclavicular lymph node (2/11), similar. 1.8 cm subcarinal lymph node unchanged. Right hilar lymph nodes measure up to 12 mm, also similar. Calcified left subpectoral mass measures 1.9 x 3.5 cm, stable. Left axillary adenopathy measures up to 2.2 x 4.1 cm (2/21), stable. Surgical clips in the right axilla. Esophagus is grossly unremarkable. Lungs/Pleura: Mild paraseptal emphysema. Image quality is degraded by respiratory motion. A few scattered pulmonary nodular densities measure up to 6 mm in the posterior right lower lobe (4/112), as before. Subpleural radiation scarring in the anterior left upper lobe. No pleural fluid. Airway is unremarkable. Musculoskeletal: Degenerative changes in the spine and shoulders. Old left rib fractures. No worrisome lytic or sclerotic lesions. CT ABDOMEN PELVIS FINDINGS Hepatobiliary: Liver is slightly decreased in attenuation diffusely. Tiny stones layer in the gallbladder. No biliary ductal dilatation. Pancreas:  Negative. Spleen: Negative. Adrenals/Urinary Tract: Right adrenal gland is unremarkable. Left adrenal gland may be slightly thickened. Subcentimeter low-attenuation lesions in the kidneys are too small to characterize. Kidneys are otherwise  unremarkable. Ureters are decompressed. Bladder is grossly unremarkable. Stomach/Bowel: Tiny hiatal hernia. Stomach is unremarkable. Duodenal diverticula are incidentally noted. Small bowel and colon are otherwise unremarkable. Patient is reportedly status post appendectomy. Vascular/Lymphatic: Atherosclerotic calcification of the aorta. Dilated celiac trunk, measuring up to 1.3 cm, suggesting a proximal stenosis. No pathologically enlarged lymph nodes. Reproductive: Hysterectomy.  No adnexal mass. Other: No free fluid.  Mesenteries and peritoneum are unremarkable. Musculoskeletal: Right hip arthroplasty. Degenerative changes in the spine and hips. No worrisome lytic or sclerotic lesions. IMPRESSION: 1. Left axillary, left subpectoral, left supraclavicular and mediastinal adenopathy is stable. 2. Scattered tiny pulmonary nodules, stable. 3. Hepatic steatosis. 4. Cholelithiasis. 5. Dilated celiac trunk, suggesting a proximal stenosis. 6. Aortic atherosclerosis (ICD10-I70.0). Coronary artery calcification. 7. Enlarged pulmonic trunk, indicative of pulmonary arterial hypertension. 8.  Emphysema (ICD10-J43.9). Electronically Signed   By: Lorin Picket M.D.   On: 05/07/2021 10:20   NM Bone Scan Whole Body  Result Date: 05/08/2021 CLINICAL DATA:  Breast cancer restaging. EXAM: NUCLEAR MEDICINE WHOLE BODY BONE SCAN TECHNIQUE: Whole body anterior and posterior images were obtained approximately 3 hours after intravenous injection of radiopharmaceutical. RADIOPHARMACEUTICALS:  20.5 mCi Technetium-5mMDP IV COMPARISON:  CT imaging May 06, 2021 FINDINGS: Uptake in the thoracic and lumbar spine correlates with degenerative changes on recent CT imaging. Mild symmetric uptake in the  SI joints is consistent with degenerative changes well. Significant degenerative changes are seen in the shoulders. Degenerative changes seen in the knees. Degenerative changes seen at the right sternoclavicular junction and the sternomanubrial junction. Mild uptake is associated with the right hip replacement. Uptake in multiple adjacent left ribs consistent with known healed fractures. No convincing scintigraphic evidence of bony metastatic disease. IMPRESSION: No scintigraphic evidence of bony metastatic disease. Electronically Signed   By: DDorise BullionIII M.D.   On: 05/08/2021 07:06   CT Abdomen Pelvis W Contrast  Result Date: 05/07/2021 CLINICAL DATA:  Breast cancer, ongoing chemotherapy. EXAM: CT CHEST, ABDOMEN, AND PELVIS WITH CONTRAST TECHNIQUE: Multidetector CT imaging of the chest, abdomen and pelvis was performed following the standard protocol during bolus administration of intravenous contrast. RADIATION DOSE REDUCTION: This exam was performed according to the departmental dose-optimization program which includes automated exposure control, adjustment of the mA and/or kV according to patient size and/or use of iterative reconstruction technique. CONTRAST:  1063mOMNIPAQUE IOHEXOL 300 MG/ML  SOLN COMPARISON:  03/05/2021. FINDINGS: CT CHEST FINDINGS Cardiovascular: Atherosclerotic calcification of the aorta, aortic valve and coronary arteries. Pulmonic trunk and heart are enlarged. No pericardial effusion. Mediastinum/Nodes: 2.4 cm low-attenuation left thyroid nodule. No follow-up recommended unless clinically warranted. (Ref: J Am Coll Radiol. 2015 Feb;12(2): 143-50). 10 mm left supraclavicular lymph node (2/11), similar. 1.8 cm subcarinal lymph node unchanged. Right hilar lymph nodes measure up to 12 mm, also similar. Calcified left subpectoral mass measures 1.9 x 3.5 cm, stable. Left axillary adenopathy measures up to 2.2 x 4.1 cm (2/21), stable. Surgical clips in the right axilla. Esophagus is  grossly unremarkable. Lungs/Pleura: Mild paraseptal emphysema. Image quality is degraded by respiratory motion. A few scattered pulmonary nodular densities measure up to 6 mm in the posterior right lower lobe (4/112), as before. Subpleural radiation scarring in the anterior left upper lobe. No pleural fluid. Airway is unremarkable. Musculoskeletal: Degenerative changes in the spine and shoulders. Old left rib fractures. No worrisome lytic or sclerotic lesions. CT ABDOMEN PELVIS FINDINGS Hepatobiliary: Liver is slightly decreased in attenuation diffusely. Tiny stones layer in the gallbladder. No biliary ductal dilatation.  Pancreas: Negative. Spleen: Negative. Adrenals/Urinary Tract: Right adrenal gland is unremarkable. Left adrenal gland may be slightly thickened. Subcentimeter low-attenuation lesions in the kidneys are too small to characterize. Kidneys are otherwise unremarkable. Ureters are decompressed. Bladder is grossly unremarkable. Stomach/Bowel: Tiny hiatal hernia. Stomach is unremarkable. Duodenal diverticula are incidentally noted. Small bowel and colon are otherwise unremarkable. Patient is reportedly status post appendectomy. Vascular/Lymphatic: Atherosclerotic calcification of the aorta. Dilated celiac trunk, measuring up to 1.3 cm, suggesting a proximal stenosis. No pathologically enlarged lymph nodes. Reproductive: Hysterectomy.  No adnexal mass. Other: No free fluid.  Mesenteries and peritoneum are unremarkable. Musculoskeletal: Right hip arthroplasty. Degenerative changes in the spine and hips. No worrisome lytic or sclerotic lesions. IMPRESSION: 1. Left axillary, left subpectoral, left supraclavicular and mediastinal adenopathy is stable. 2. Scattered tiny pulmonary nodules, stable. 3. Hepatic steatosis. 4. Cholelithiasis. 5. Dilated celiac trunk, suggesting a proximal stenosis. 6. Aortic atherosclerosis (ICD10-I70.0). Coronary artery calcification. 7. Enlarged pulmonic trunk, indicative of  pulmonary arterial hypertension. 8.  Emphysema (ICD10-J43.9). Electronically Signed   By: Lorin Picket M.D.   On: 05/07/2021 10:20   CT ABDOMEN PELVIS W CONTRAST  Result Date: 04/27/2021 CLINICAL DATA:  Abdominal pain, acute, nonlocalized. Nausea and vomiting EXAM: CT ABDOMEN AND PELVIS WITH CONTRAST TECHNIQUE: Multidetector CT imaging of the abdomen and pelvis was performed using the standard protocol following bolus administration of intravenous contrast. RADIATION DOSE REDUCTION: This exam was performed according to the departmental dose-optimization program which includes automated exposure control, adjustment of the mA and/or kV according to patient size and/or use of iterative reconstruction technique. CONTRAST:  153m OMNIPAQUE IOHEXOL 300 MG/ML  SOLN COMPARISON:  06/16/2012 FINDINGS: Lower chest: Cardiomegaly.  Included lung bases are clear. Hepatobiliary: No focal liver abnormality. There are a few small layering stones within the gallbladder lumen. No pericholecystic inflammatory changes by CT. No biliary dilatation. Pancreas: Unremarkable. No pancreatic ductal dilatation or surrounding inflammatory changes. Spleen: Normal in size without focal abnormality. Adrenals/Urinary Tract: Adrenal glands within normal limits. Multiple subcentimeter low-density lesions within both kidneys, too small to definitively characterized. No renal stone or hydronephrosis. Urinary bladder within normal limits for the degree of distension. Stomach/Bowel: Small hiatal hernia. Stomach otherwise within normal limits. Small-bowel obstruction with transition point located within patient's inferior ventral abdominal wall hernia (series 5, image 84). Fecalization of small bowel content upstream from the site of obstruction. The small bowel and colon distal to the site of obstruction is decompressed. Extensive diverticulosis, most pronounced within the sigmoid colon Vascular/Lymphatic: Aortic atherosclerosis. No enlarged  abdominal or pelvic lymph nodes. Reproductive: Status post hysterectomy. No adnexal masses. Other: Inferior ventral abdominal wall hernia with scarring along the anteroinferior margin of the hernia sac. No ascites. No organized abdominopelvic fluid collection. No pneumoperitoneum. Musculoskeletal: Partially visualized right hip arthroplasty. Advanced degenerative changes of the pubic symphysis with small joint effusion, nonspecific. Advanced degenerative disc disease and facet arthropathy of the lumbar spine. Multilevel bridging endplate osteophytes within the lower thoracic spine. IMPRESSION: 1. Small-bowel obstruction with transition point located within patient's inferior ventral abdominal wall hernia. 2. Extensive colonic diverticulosis without evidence of acute diverticulitis. 3. Cholelithiasis without evidence of acute cholecystitis. Aortic Atherosclerosis (ICD10-I70.0). Electronically Signed   By: NDavina PokeD.O.   On: 04/27/2021 15:22   DG Abd Portable 1V-Small Bowel Obstruction Protocol-initial, 8 hr delay  Result Date: 04/28/2021 CLINICAL DATA:  Gastrografin given at 1230, there was miscommunication about when the contrast was given so patient missed their 8 hour delay. EXAM: PORTABLE ABDOMEN - 1  VIEW COMPARISON:  None. FINDINGS: PO contrast reaches the rectum. The bowel gas pattern is normal. No radio-opaque calculi or other significant radiographic abnormality are seen. Enteric tube with tip coursing below the hemidiaphragm overlying the expected region of the gastric antrum/pyloric region. Partially visualized total right hip arthroplasty. IMPRESSION: 1. PO contrast reaches the rectum. 2. Enteric tube in grossly appropriate position. Electronically Signed   By: Iven Finn M.D.   On: 04/28/2021 23:21   ECHOCARDIOGRAM COMPLETE  Result Date: 05/06/2021    ECHOCARDIOGRAM REPORT   Patient Name:   SONORA CATLIN Date of Exam: 05/06/2021 Medical Rec #:  [id number]       Height:       67.0 in  Accession #:    [id number]      Weight:       205.9 lb Date of Birth:  Apr 12, 1933       BSA:          2.047 m Patient Age:    88 years        BP:           122/68 mmHg Patient Gender: F               HR:           73 bpm. Exam Location:  Inpatient Procedure: 2D Echo, 3D Echo and Strain Analysis Indications:    Chemotherapy  History:        Patient has prior history of Echocardiogram examinations, most                 recent 12/03/2017. Risk Factors:Hypertension and Diabetes.  Sonographer:    Jefferey Pica Referring Phys: [id number] [place]  1. Left ventricular ejection fraction, by estimation, is 60 to 65%. The left ventricle has normal function. The left ventricle has no regional wall motion abnormalities. There is severe left ventricular hypertrophy. Left ventricular diastolic parameters  are consistent with Grade II diastolic dysfunction (pseudonormalization). Elevated left ventricular end-diastolic pressure. The average left ventricular global longitudinal strain is -22.9 %. The global longitudinal strain is normal.  2. Right ventricular systolic function is normal. The right ventricular size is normal.  3. Left atrial size was moderately dilated.  4. The mitral valve is degenerative. No evidence of mitral valve regurgitation. No evidence of mitral stenosis. Moderate mitral annular calcification.  5. The aortic valve is tricuspid. There is mild calcification of the aortic valve. There is mild thickening of the aortic valve. Aortic valve regurgitation is not visualized. Aortic valve sclerosis is present, with no evidence of aortic valve stenosis.  6. The inferior vena cava is normal in size with greater than 50% respiratory variability, suggesting right atrial pressure of 3 mmHg. FINDINGS  Left Ventricle: Left ventricular ejection fraction, by estimation, is 60 to 65%. The left ventricle has normal function. The left ventricle has no regional wall motion abnormalities. The average left  ventricular global longitudinal strain is -22.9 %. The global longitudinal strain is normal. The left ventricular internal cavity size was normal in size. There is severe left ventricular hypertrophy. Left ventricular diastolic parameters are consistent with Grade II diastolic dysfunction (pseudonormalization). Elevated left ventricular end-diastolic pressure. Right Ventricle: The right ventricular size is normal. No increase in right ventricular wall thickness. Right ventricular systolic function is normal. Left Atrium: Left atrial size was moderately dilated. Right Atrium: Right atrial size was normal in size. Pericardium: There is no evidence of pericardial effusion. Mitral Valve: The mitral valve is degenerative  in appearance. There is mild thickening of the mitral valve leaflet(s). There is mild calcification of the mitral valve leaflet(s). Moderate mitral annular calcification. No evidence of mitral valve regurgitation. No evidence of mitral valve stenosis. Tricuspid Valve: The tricuspid valve is normal in structure. Tricuspid valve regurgitation is not demonstrated. No evidence of tricuspid stenosis. Aortic Valve: The aortic valve is tricuspid. There is mild calcification of the aortic valve. There is mild thickening of the aortic valve. Aortic valve regurgitation is not visualized. Aortic valve sclerosis is present, with no evidence of aortic valve stenosis. Aortic valve peak gradient measures 8.9 mmHg. Pulmonic Valve: The pulmonic valve was normal in structure. Pulmonic valve regurgitation is not visualized. No evidence of pulmonic stenosis. Aorta: The aortic root is normal in size and structure. Venous: The inferior vena cava is normal in size with greater than 50% respiratory variability, suggesting right atrial pressure of 3 mmHg. IAS/Shunts: No atrial level shunt detected by color flow Doppler.  LEFT VENTRICLE PLAX 2D LVIDd:         3.00 cm      Diastology LVIDs:         2.60 cm      LV e' lateral:    6.15 cm/s LV PW:         1.05 cm      LV E/e' lateral: 15.6 LV IVS:        3.15 cm LVOT diam:     2.00 cm      2D Longitudinal Strain LV SV:         93           2D Strain GLS Avg:     -22.9 % LV SV Index:   46 LVOT Area:     3.14 cm  LV Volumes (MOD) LV vol d, MOD A4C: 109.0 ml LV vol s, MOD A4C: 49.4 ml LV SV MOD A4C:     109.0 ml RIGHT VENTRICLE             IVC RV Basal diam:  2.90 cm     IVC diam: 1.70 cm RV S prime:     16.10 cm/s TAPSE (M-mode): 2.1 cm LEFT ATRIUM             Index        RIGHT ATRIUM           Index LA diam:        4.30 cm 2.10 cm/m   RA Area:     19.60 cm LA Vol (A2C):   82.8 ml 40.44 ml/m  RA Volume:   53.40 ml  26.08 ml/m LA Vol (A4C):   93.3 ml 45.57 ml/m LA Biplane Vol: 94.7 ml 46.25 ml/m  AORTIC VALVE                 PULMONIC VALVE AV Area (Vmax): 2.84 cm     PV Vmax:       0.83 m/s AV Vmax:        149.50 cm/s  PV Peak grad:  2.8 mmHg AV Peak Grad:   8.9 mmHg LVOT Vmax:      135.00 cm/s LVOT Vmean:     89.400 cm/s LVOT VTI:       0.297 m  AORTA Ao Root diam: 3.00 cm Ao Asc diam:  3.20 cm MITRAL VALVE MV Area (PHT): 3.23 cm    SHUNTS MV Decel Time: 235 msec    Systemic VTI:  0.30 m MV E velocity:  96.10 cm/s  Systemic Diam: 2.00 cm MV A velocity: 88.10 cm/s MV E/A ratio:  1.09 Jenkins Rouge MD Electronically signed by Jenkins Rouge MD Signature Date/Time: 05/06/2021/11:33:51 AM    Final      ASSESSMENT: 88 y.o. [place] woman  (1) status post right lumpectomy and axillary dissection in 1993 in [place] for what appears to have been a stage I breast cancer, treated with radiation and then tamoxifen for 5 years  (2) status post right mastectomy 2002 for a recurrence in the right breast, treated adjuvantly with tamoxifen for an additional 5 years  (3) status post left mastectomy and sentinel lymph node sampling 03/07/2003 for a lower inner quadrant pT1b, pN0, stage IA mucinous breast cancer, grade 2; no radiation therapy was given. Tamoxifen was continued until  2009  RECURRENT DISEASE/ LEFT: OCTOBER 2014 (4) excisional biopsy of a left chest wall mass 01/26/2013 showing carcinoma consistent with invasive ductal carcinoma, estrogen receptor 100% positive, progesterone receptor 0% positive, with an MIB-1 of 22% and no HER-2 amplification.   (5)  PET scan on 03/03/2013 confirmed extensive metastatic adenopathy in the left axilla, left subpectoral musculature, and left supraclavicular region  (6)  started on anastrozole daily beginning 03/01/2013, interrupted during radiation therapy, resumed March 2015, discontinued March 2016 because of fatigue and arthralgias  (7) radiation therapy 03/31/2013 through 05/23/2013 Site/dose:   The patient was treated initially with a forward treatment planning technique to the left chest wall in addition to treatment to the supraclavicular region. This consisted of a 3-D conformal technique. The patient was treated in this fashion to a dose of 50.4 gray. The patient then received a 14 gray boost treatment using an electron field. The total dose was 64.4 gray.  (8) Osteopenia, zometa yearly started 07/27/2014, but poorly tolerated.   (a) bone density scan on 12/27/13 showed a t-score of -1.8   (b) started Denosumab/Prolia April 2017, canceled after one dose per patient  (9) tamoxifen started 07/27/2014, stopped November 2019 (had total 5 years of antiestrogens)  (10) likely thalassemia trait  (11) left subpectoral soft tissue nodule noted 07/17/2015 unchanged through serial scans, most recent 40/97/3532, with no metabolic activity noted in PET scan of 2016 and interval calcification.  This is presumed benign  (12)  ADDITIONAL REGIONAL RECURRENCE:  (A) CT of the chest with contrast 03/05/2021 obtained to evaluate left upper extremity lymphedema as showed acute pulmonary emboli.  There was also left axillary and subpectoral lymphadenopathy  (B) rivaroxaban started 03/05/2021  (C) left breast ultrasound 03/29/2021 shows 2  areas in the infraclavicular tissue consistent with fat necrosis.  There were however multiple retropectoral interpectoral and left axillary masses.  (D) left axillary lymph node biopsy 04/04/2021  She had biopsy of the left axillary lymph node mass which showed invasive ductal carcinoma, grade 3, ER positive, 100%, strong staining intensity, PR positive, 20%, weak staining intensity, Ki-67 of 15%, HER2 positive by IHC, ratio 2.90 She was initially scheduled for fulvestrant however given her to positive breast cancer, we have discussed about additional therapy today.  PLAN:   I have discussed the biology of the breast cancer being different from her previous breast cancers.  We have discussed about considering fulvestrant along with the trastuzumab given her HER2 amplified breast cancer.  I have explained to her pathogenesis of HER2 amplified breast cancer, mechanism of action of trastuzumab as well as adverse effects of trastuzumab in detail.   We have discussed about cardiotoxicity from trastuzumab and the need for echocardiogram  on a periodic basis.  Echo ordered today. I have discussed about proceeding with systemic scans since there is concern for metastatic cancer, patient also has new onset low back pain.  If she has trouble making it to multiple appointments given different schedules on fulvestrant and trastuzumab, we could also consider doing trastuzumab and exemestane while we are awaiting additional molecular testing (in case she is metastatic) She understands that there could be aggressive treatment strategies for this particular breast cancer however given her age, comorbidities and her current performance status, it is reasonable to try single agent trastuzumab and evaluate her tolerance and response and act accordingly. I have explained to the patient that it is unlikely that anticoagulation with Xarelto has been causing her low back pain, she should continue anticoagulation as prescribed,  prescription refill has been sent to pharmacy of her choice We will arrange for trastuzumab infusions every 21 days, she will continue fulvestrant and return to clinic in 3 to 4 weeks.  Trastuzumab and fulvestrant Imaging Continue Xarelto Echo to be scheduled  Total encounter time 45 minutes.   *Total Encounter Time as defined by the Centers for Medicare and Medicaid Services includes, in addition to the face-to-face time of a patient visit (documented in the note above) non-face-to-face time: obtaining and reviewing outside history, ordering and reviewing medications, tests or procedures, care coordination (communications with other health care professionals or caregivers) and documentation in the medical record.

## 2021-05-08 NOTE — Telephone Encounter (Signed)
Patient rescheduled genetics appt for 2/16 at 10am

## 2021-05-08 NOTE — Progress Notes (Deleted)
ID: Tami Schmidt OB: 1932/09/19  MR#: [id number]  CSN#:[id number]  Patient Care Team: [name], MD as PCP - General (Family Medicine) Gentry Fitz, MD as Consulting Physician (Family Medicine) Rockwell Germany, RN as Oncology Nurse Navigator Tressie Ellis, Paulette Blanch, RN as Oncology Nurse Navigator Benay Pike, MD as Consulting Physician (Hematology and Oncology) OTHER MD:   CHIEF COMPLAINT:  bilateral breast cancersadenopathy  CURRENT TREATMENT: Rivaroxaban; work-up in progress   INTERVAL HISTORY: Tami Schmidt returns today for follow up of her history of breast cancer and new left arm lymphedema.    CT of the chest with contrast 03/06/2021 showed nonocclusive segmental and subsegmental pulmonary emboli in the right upper lobe.  She was started on rivaroxaban and is tolerating that well so far, with no bleeding or bruising that she is aware of.  The chest CT also showed left axillary and subpectoral lymphadenopathy, explaining the new onset of left upper extremity lymphedema.  This was followed by left breast and axilla ultrasound on 03/29/2021 showing: two stable masses in infraclavicular subcutaneous tissues most consistent with fat necrosis and stable; a third lesion has similar characteristics also in infraclavicular subcutaneous region, possibly representing an additional focus of fat necrosis; however there are multiple retropectoral, interpectoral and left axillary masses suspicious for metastatic disease.  Biopsy of axillary mass showed ER positive PR positive, HER2 amplified invasive ductal carcinoma, grade 3 of 3 She is here to discuss our treatment recommendations.  Since last visit, she tells me that her lymphedema continues to bother her.  She has also noticed new onset left lower back pain about 10 days ago.  Other than that her knees bother her which is a chronic issue.  She denies any changes in breathing, bowel habits or urinary habits.  No new neurological  complaints.  Rest of the pertinent 10 point ROS reviewed and negative.  COVID 19 VACCINATION STATUS:    BREAST CANCER HISTORY: From the earlier summary:  Tami Schmidt's history of breast cancer dates back to 1993, when she underwent biopsy of her right breast in [place]. I do not have those details, but she moved to [place] shortly thereafter and it was in [place] where she had her right lumpectomy and axillary lymph node dissection. She had a total of 6 lymph nodes removed, she tells me, and all of them were clear. She does not recall the size of the primary. She was treated with adjuvant radiation and then received tamoxifen between 1993 and 1998.  In 2002 she had a local recurrence in the right breast, and underwent right mastectomy. She received tamoxifen between 2002 and 2007.  In 2004 she underwent a left mastectomy and sentinel lymph node sampling for a 9 mm mucinous carcinoma, grade 2, multifocal, associated with ductal carcinoma in situ. The single sentinel lymph node was negative. Margins were ample. She did not receive adjuvant radiation, but continued on tamoxifen as noted above, until 2007.  More recently, in July of 2014, the patient noted a "pimple" in the left chest wall, superior to her prior mastectomy incision. She brought this to the attention of her surgeon, Dr. Ninfa Linden and he describes a dimple area with a firm mass underneath. He also describes a large lipoma in the left clavicular area and a second one on her abdominal wall. Both of these were symptomatic. All 3 masses were removed 01/26/2013. The pathology from this procedure ([facility] [id number]) showed, in the chest wall, and invasive ductal carcinoma measuring grossly 1.3 cm. The lesion abutted the  closest inked resection margin. The other 2 masses proved to be lipomas.  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   Diabetes mellitus    fasting 90-100   HTN (hypertension) 03/25/2013   Hx of radiation therapy    breasts hx   Hypercholesterolemia    Hypertension    Lymphedema of arm    left arm   Pulmonary embolism ([place])    Type II or unspecified type diabetes mellitus without mention of complication, not stated as uncontrolled 03/25/2013    PAST SURGICAL HISTORY: Past Surgical History:  Procedure Laterality Date   ABDOMINAL HYSTERECTOMY     ANKLE SURGERY Right 1995   APPENDECTOMY     BIOPSY  01/07/2018   Procedure: BIOPSY;  Surgeon: [name], MD;  Location: [facility];  Service: Gastroenterology;;   BREAST SURGERY Bilateral    mastectomy   COLONOSCOPY WITH PROPOFOL N/A 01/07/2018   Procedure: COLONOSCOPY WITH PROPOFOL;  Surgeon: [name], MD;  Location: [facility];  Service: Gastroenterology;  Laterality: N/A;   ESOPHAGOGASTRODUODENOSCOPY (EGD) WITH PROPOFOL N/A 01/06/2018   Procedure: ESOPHAGOGASTRODUODENOSCOPY (EGD) WITH PROPOFOL;  Surgeon: [name], MD;  [facility];  Service: Gastroenterology;  Laterality: N/A;   HERNIA REPAIR  04-26-2010   MASS EXCISION Left 01/26/2013   Procedure: EXCISION LEFT CHEST WALL MASS AND LEFT ABDOMNAL WALL MASS;  Surgeon: Harl Bowie, MD;  Location: [facility];  Service: General;  Laterality: Left;   MASS EXCISION Left 09/20/2014   Procedure: EXCISION OF LEFT CHEST WALL MASS;  Surgeon: Coralie Keens, MD;  Location: [facility];  Service: General;  Laterality: Left;   TEE WITHOUT CARDIOVERSION N/A 12/08/2017   Procedure: TRANSESOPHAGEAL ECHOCARDIOGRAM (TEE);  Surgeon: Lelon Perla, MD;  Location: [facility];  Service: Cardiovascular;  Laterality: N/A;   TOTAL HIP ARTHROPLASTY Right 01/05/2017   TOTAL HIP ARTHROPLASTY Right 01/05/2017   Procedure: TOTAL HIP ARTHROPLASTY ANTERIOR APPROACH;  Surgeon: Frederik Pear, MD;   Location: [facility];  Service: Orthopedics;  Laterality: Right;    FAMILY HISTORY The patient's father died from a heart attack at the age of 30. The patient's mother died at the age of 16 from myocarditis. The patient had no brothers, one sister. There is no history of breast or ovarian cancer in the family to her knowledge   GYNECOLOGIC HISTORY:   (Reviewed 09/22/2013) Menarche age 22, first live birth age 25, the patient is Tami Schmidt P5. She underwent hysterectomy in 1984. She did not use hormone replacement.   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 Their children are: Tami Schmidt who lives in [place] and teaches theater there; Tami Schmidt who works as a Social worker in [place]; Tami Schmidt who lives in [place] and is a "radio personality"; Tami Schmidt, who is a Biomedical scientist in [place], and Tami Schmidt who is a musician living in [place]. The patient has 2 grandchildren. She attends a [employer]    ADVANCED DIRECTIVES: Not in place   HEALTH MAINTENANCE:  Social History   Tobacco Use   Smoking status: Former    Types: Cigarettes    Quit date: 04/14/1972    Years since  quitting: 49.0   Smokeless tobacco: Never  Vaping Use   Vaping Use: Never used  Substance Use Topics   Alcohol use: Yes    Comment: occasional   Drug use: Yes    Colonoscopy: 2011  PAP: Status post hysterectomy  Bone density: 1993 ; repeat 12/27/2013 showed osteopenia with a T score of -1.8  Lipid panel: not on file  Allergies  Allergen Reactions   Bactrim Swelling    SWELLING OF MOUTH/FACE.   Lisinopril Swelling    SWELLING OF MOUTH/FACE.   Vasotec Swelling    SWELLING OF MOUTH/FACE.   Sulfamethoxazole-Trimethoprim     Other reaction(s): Unknown    Current Outpatient Medications  Medication Sig Dispense Refill   amLODipine (NORVASC) 10 MG tablet Take 10 mg by mouth daily with breakfast.      diclofenac Sodium  (VOLTAREN) 1 % GEL Apply 2 g topically daily as needed (For topical arthritis.).     HUMULIN N 100 UNIT/ML injection Inject 30 Units into the skin daily.     metFORMIN (GLUCOPHAGE) 500 MG tablet Take 500 mg by mouth daily with breakfast.     olmesartan-hydrochlorothiazide (BENICAR HCT) 40-12.5 MG tablet Take 1 tablet by mouth daily.     rivaroxaban (XARELTO) 20 MG TABS tablet Take 1 tablet (20 mg total) by mouth daily with supper. 90 tablet 3   SM IRON 325 (65 Fe) MG tablet Take 325 mg by mouth daily.     Turmeric (QC TUMERIC COMPLEX PO) Take 1 tablet by mouth daily.     TYLENOL 325 MG tablet Take 325 mg by mouth daily.     VITAMIN D PO Take 1 tablet by mouth daily.     No current facility-administered medications for this visit.    OBJECTIVE: African-American woman who appears stated age  89:   05/08/21 1418  BP: (!) 165/63  Pulse: 92  Resp: 18  Temp: (!) 97.3 F (36.3 C)  SpO2: 100%     Body mass index is 32.66 kg/m.    ECOG FS: 2 Filed Weights   05/08/21 1418  Weight: 208 lb 8 oz (94.6 kg)   Sclerae unicteric, EOMs intact Wearing a mask She has small palpable 1/3-1 /2 centimeter spots along the left clavicular area, which are unchanged from baseline Lungs no rales or rhonchi Heart regular rate and rhythm Abd soft, obese, nontender, positive bowel sounds MSK no focal spinal tenderness, left upper extremity lymphedema with sleeve in place Neuro: nonfocal, well oriented, appropriate affect Breasts: Status post bilateral mastectomies.  I do not feel palpable adenopathy in the left axilla.   LAB RESULTS:  Lab Results  Component Value Date   WBC 6.5 04/30/2021   NEUTROABS 5.0 04/28/2021   HGB 11.3 (L) 04/30/2021   HCT 36.4 04/30/2021   MCV 80.2 04/30/2021   PLT 198 04/30/2021      Chemistry      Component Value Date/Time   NA 138 04/30/2021 0334   NA 139 01/20/2017 1057   K 3.3 (L) 04/30/2021 0334   K 3.9 01/20/2017 1057   CL 103 04/30/2021 0334   CO2 27  04/30/2021 0334   CO2 25 01/20/2017 1057   BUN 8 04/30/2021 0334   BUN 15.6 01/20/2017 1057   CREATININE 0.70 04/30/2021 0334   CREATININE 0.74 04/18/2021 1023   CREATININE 0.7 01/20/2017 1057      Component Value Date/Time   CALCIUM 8.6 (L) 04/30/2021 0334   CALCIUM 9.3 01/20/2017 1057   ALKPHOS 55  04/28/2021 0702   ALKPHOS 69 01/20/2017 1057   AST 14 (L) 04/28/2021 0702   AST 10 (L) 04/18/2021 1023   AST 10 01/20/2017 1057   ALT 8 04/28/2021 0702   ALT 5 04/18/2021 1023   ALT 14 01/20/2017 1057   BILITOT 0.9 04/28/2021 0702   BILITOT 0.5 04/18/2021 1023   BILITOT 0.43 01/20/2017 1057      STUDIES: DG Abd 1 View  Result Date: 04/28/2021 CLINICAL DATA:  Check nasogastric catheter placement EXAM: ABDOMEN - 1 VIEW COMPARISON:  Film from earlier in the same day. FINDINGS: Gastric catheter has been advanced and now lies within the stomach. No obstructive changes are seen at this time. No free air is noted. IMPRESSION: Gastric catheter now lies within the stomach in appropriate position. Electronically Signed   By: Inez Catalina M.D.   On: 04/28/2021 00:12   DG Abd 1 View  Result Date: 04/27/2021 CLINICAL DATA:  NGT placement. EXAM: ABDOMEN - 1 VIEW COMPARISON:  CT with IV contrast earlier today. FINDINGS: NGT is in place with the tip just below the hiatus and the side hole in the distal esophagus and needs to be advanced further in 12-15 cm for better positioning. Imaging includes the upper to mid abdomen excluding the true pelvis. The bowel pattern again demonstrating evidence of small-bowel obstruction with small bowel dilatation up to 3.6 cm. There is no supine evidence of free air. Visceral shadows are stable; no appreciable pathologic calcification. Cardiomegaly. IMPRESSION: 1. Enteric tube should be advanced further in 12-15 cm. 2. Small bowel dilatation is unchanged. Electronically Signed   By: Telford Nab M.D.   On: 04/27/2021 22:57   CT Chest W Contrast  Result Date:  05/07/2021 CLINICAL DATA:  Breast cancer, ongoing chemotherapy. EXAM: CT CHEST, ABDOMEN, AND PELVIS WITH CONTRAST TECHNIQUE: Multidetector CT imaging of the chest, abdomen and pelvis was performed following the standard protocol during bolus administration of intravenous contrast. RADIATION DOSE REDUCTION: This exam was performed according to the departmental dose-optimization program which includes automated exposure control, adjustment of the mA and/or kV according to patient size and/or use of iterative reconstruction technique. CONTRAST:  161m OMNIPAQUE IOHEXOL 300 MG/ML  SOLN COMPARISON:  03/05/2021. FINDINGS: CT CHEST FINDINGS Cardiovascular: Atherosclerotic calcification of the aorta, aortic valve and coronary arteries. Pulmonic trunk and heart are enlarged. No pericardial effusion. Mediastinum/Nodes: 2.4 cm low-attenuation left thyroid nodule. No follow-up recommended unless clinically warranted. (Ref: J Am Coll Radiol. 2015 Feb;12(2): 143-50). 10 mm left supraclavicular lymph node (2/11), similar. 1.8 cm subcarinal lymph node unchanged. Right hilar lymph nodes measure up to 12 mm, also similar. Calcified left subpectoral mass measures 1.9 x 3.5 cm, stable. Left axillary adenopathy measures up to 2.2 x 4.1 cm (2/21), stable. Surgical clips in the right axilla. Esophagus is grossly unremarkable. Lungs/Pleura: Mild paraseptal emphysema. Image quality is degraded by respiratory motion. A few scattered pulmonary nodular densities measure up to 6 mm in the posterior right lower lobe (4/112), as before. Subpleural radiation scarring in the anterior left upper lobe. No pleural fluid. Airway is unremarkable. Musculoskeletal: Degenerative changes in the spine and shoulders. Old left rib fractures. No worrisome lytic or sclerotic lesions. CT ABDOMEN PELVIS FINDINGS Hepatobiliary: Liver is slightly decreased in attenuation diffusely. Tiny stones layer in the gallbladder. No biliary ductal dilatation. Pancreas:  Negative. Spleen: Negative. Adrenals/Urinary Tract: Right adrenal gland is unremarkable. Left adrenal gland may be slightly thickened. Subcentimeter low-attenuation lesions in the kidneys are too small to characterize. Kidneys are otherwise  unremarkable. Ureters are decompressed. Bladder is grossly unremarkable. Stomach/Bowel: Tiny hiatal hernia. Stomach is unremarkable. Duodenal diverticula are incidentally noted. Small bowel and colon are otherwise unremarkable. Patient is reportedly status post appendectomy. Vascular/Lymphatic: Atherosclerotic calcification of the aorta. Dilated celiac trunk, measuring up to 1.3 cm, suggesting a proximal stenosis. No pathologically enlarged lymph nodes. Reproductive: Hysterectomy.  No adnexal mass. Other: No free fluid.  Mesenteries and peritoneum are unremarkable. Musculoskeletal: Right hip arthroplasty. Degenerative changes in the spine and hips. No worrisome lytic or sclerotic lesions. IMPRESSION: 1. Left axillary, left subpectoral, left supraclavicular and mediastinal adenopathy is stable. 2. Scattered tiny pulmonary nodules, stable. 3. Hepatic steatosis. 4. Cholelithiasis. 5. Dilated celiac trunk, suggesting a proximal stenosis. 6. Aortic atherosclerosis (ICD10-I70.0). Coronary artery calcification. 7. Enlarged pulmonic trunk, indicative of pulmonary arterial hypertension. 8.  Emphysema (ICD10-J43.9). Electronically Signed   By: Lorin Picket M.D.   On: 05/07/2021 10:20   NM Bone Scan Whole Body  Result Date: 05/08/2021 CLINICAL DATA:  Breast cancer restaging. EXAM: NUCLEAR MEDICINE WHOLE BODY BONE SCAN TECHNIQUE: Whole body anterior and posterior images were obtained approximately 3 hours after intravenous injection of radiopharmaceutical. RADIOPHARMACEUTICALS:  20.5 mCi Technetium-5mMDP IV COMPARISON:  CT imaging May 06, 2021 FINDINGS: Uptake in the thoracic and lumbar spine correlates with degenerative changes on recent CT imaging. Mild symmetric uptake in the  SI joints is consistent with degenerative changes well. Significant degenerative changes are seen in the shoulders. Degenerative changes seen in the knees. Degenerative changes seen at the right sternoclavicular junction and the sternomanubrial junction. Mild uptake is associated with the right hip replacement. Uptake in multiple adjacent left ribs consistent with known healed fractures. No convincing scintigraphic evidence of bony metastatic disease. IMPRESSION: No scintigraphic evidence of bony metastatic disease. Electronically Signed   By: DDorise BullionIII M.D.   On: 05/08/2021 07:06   CT Abdomen Pelvis W Contrast  Result Date: 05/07/2021 CLINICAL DATA:  Breast cancer, ongoing chemotherapy. EXAM: CT CHEST, ABDOMEN, AND PELVIS WITH CONTRAST TECHNIQUE: Multidetector CT imaging of the chest, abdomen and pelvis was performed following the standard protocol during bolus administration of intravenous contrast. RADIATION DOSE REDUCTION: This exam was performed according to the departmental dose-optimization program which includes automated exposure control, adjustment of the mA and/or kV according to patient size and/or use of iterative reconstruction technique. CONTRAST:  1063mOMNIPAQUE IOHEXOL 300 MG/ML  SOLN COMPARISON:  03/05/2021. FINDINGS: CT CHEST FINDINGS Cardiovascular: Atherosclerotic calcification of the aorta, aortic valve and coronary arteries. Pulmonic trunk and heart are enlarged. No pericardial effusion. Mediastinum/Nodes: 2.4 cm low-attenuation left thyroid nodule. No follow-up recommended unless clinically warranted. (Ref: J Am Coll Radiol. 2015 Feb;12(2): 143-50). 10 mm left supraclavicular lymph node (2/11), similar. 1.8 cm subcarinal lymph node unchanged. Right hilar lymph nodes measure up to 12 mm, also similar. Calcified left subpectoral mass measures 1.9 x 3.5 cm, stable. Left axillary adenopathy measures up to 2.2 x 4.1 cm (2/21), stable. Surgical clips in the right axilla. Esophagus is  grossly unremarkable. Lungs/Pleura: Mild paraseptal emphysema. Image quality is degraded by respiratory motion. A few scattered pulmonary nodular densities measure up to 6 mm in the posterior right lower lobe (4/112), as before. Subpleural radiation scarring in the anterior left upper lobe. No pleural fluid. Airway is unremarkable. Musculoskeletal: Degenerative changes in the spine and shoulders. Old left rib fractures. No worrisome lytic or sclerotic lesions. CT ABDOMEN PELVIS FINDINGS Hepatobiliary: Liver is slightly decreased in attenuation diffusely. Tiny stones layer in the gallbladder. No biliary ductal dilatation.  Pancreas: Negative. Spleen: Negative. Adrenals/Urinary Tract: Right adrenal gland is unremarkable. Left adrenal gland may be slightly thickened. Subcentimeter low-attenuation lesions in the kidneys are too small to characterize. Kidneys are otherwise unremarkable. Ureters are decompressed. Bladder is grossly unremarkable. Stomach/Bowel: Tiny hiatal hernia. Stomach is unremarkable. Duodenal diverticula are incidentally noted. Small bowel and colon are otherwise unremarkable. Patient is reportedly status post appendectomy. Vascular/Lymphatic: Atherosclerotic calcification of the aorta. Dilated celiac trunk, measuring up to 1.3 cm, suggesting a proximal stenosis. No pathologically enlarged lymph nodes. Reproductive: Hysterectomy.  No adnexal mass. Other: No free fluid.  Mesenteries and peritoneum are unremarkable. Musculoskeletal: Right hip arthroplasty. Degenerative changes in the spine and hips. No worrisome lytic or sclerotic lesions. IMPRESSION: 1. Left axillary, left subpectoral, left supraclavicular and mediastinal adenopathy is stable. 2. Scattered tiny pulmonary nodules, stable. 3. Hepatic steatosis. 4. Cholelithiasis. 5. Dilated celiac trunk, suggesting a proximal stenosis. 6. Aortic atherosclerosis (ICD10-I70.0). Coronary artery calcification. 7. Enlarged pulmonic trunk, indicative of  pulmonary arterial hypertension. 8.  Emphysema (ICD10-J43.9). Electronically Signed   By: Lorin Picket M.D.   On: 05/07/2021 10:20   CT ABDOMEN PELVIS W CONTRAST  Result Date: 04/27/2021 CLINICAL DATA:  Abdominal pain, acute, nonlocalized. Nausea and vomiting EXAM: CT ABDOMEN AND PELVIS WITH CONTRAST TECHNIQUE: Multidetector CT imaging of the abdomen and pelvis was performed using the standard protocol following bolus administration of intravenous contrast. RADIATION DOSE REDUCTION: This exam was performed according to the departmental dose-optimization program which includes automated exposure control, adjustment of the mA and/or kV according to patient size and/or use of iterative reconstruction technique. CONTRAST:  153m OMNIPAQUE IOHEXOL 300 MG/ML  SOLN COMPARISON:  06/16/2012 FINDINGS: Lower chest: Cardiomegaly.  Included lung bases are clear. Hepatobiliary: No focal liver abnormality. There are a few small layering stones within the gallbladder lumen. No pericholecystic inflammatory changes by CT. No biliary dilatation. Pancreas: Unremarkable. No pancreatic ductal dilatation or surrounding inflammatory changes. Spleen: Normal in size without focal abnormality. Adrenals/Urinary Tract: Adrenal glands within normal limits. Multiple subcentimeter low-density lesions within both kidneys, too small to definitively characterized. No renal stone or hydronephrosis. Urinary bladder within normal limits for the degree of distension. Stomach/Bowel: Small hiatal hernia. Stomach otherwise within normal limits. Small-bowel obstruction with transition point located within patient's inferior ventral abdominal wall hernia (series 5, image 84). Fecalization of small bowel content upstream from the site of obstruction. The small bowel and colon distal to the site of obstruction is decompressed. Extensive diverticulosis, most pronounced within the sigmoid colon Vascular/Lymphatic: Aortic atherosclerosis. No enlarged  abdominal or pelvic lymph nodes. Reproductive: Status post hysterectomy. No adnexal masses. Other: Inferior ventral abdominal wall hernia with scarring along the anteroinferior margin of the hernia sac. No ascites. No organized abdominopelvic fluid collection. No pneumoperitoneum. Musculoskeletal: Partially visualized right hip arthroplasty. Advanced degenerative changes of the pubic symphysis with small joint effusion, nonspecific. Advanced degenerative disc disease and facet arthropathy of the lumbar spine. Multilevel bridging endplate osteophytes within the lower thoracic spine. IMPRESSION: 1. Small-bowel obstruction with transition point located within patient's inferior ventral abdominal wall hernia. 2. Extensive colonic diverticulosis without evidence of acute diverticulitis. 3. Cholelithiasis without evidence of acute cholecystitis. Aortic Atherosclerosis (ICD10-I70.0). Electronically Signed   By: NDavina PokeD.O.   On: 04/27/2021 15:22   DG Abd Portable 1V-Small Bowel Obstruction Protocol-initial, 8 hr delay  Result Date: 04/28/2021 CLINICAL DATA:  Gastrografin given at 1230, there was miscommunication about when the contrast was given so patient missed their 8 hour delay. EXAM: PORTABLE ABDOMEN - 1  VIEW COMPARISON:  None. FINDINGS: PO contrast reaches the rectum. The bowel gas pattern is normal. No radio-opaque calculi or other significant radiographic abnormality are seen. Enteric tube with tip coursing below the hemidiaphragm overlying the expected region of the gastric antrum/pyloric region. Partially visualized total right hip arthroplasty. IMPRESSION: 1. PO contrast reaches the rectum. 2. Enteric tube in grossly appropriate position. Electronically Signed   By: Iven Finn M.D.   On: 04/28/2021 23:21   ECHOCARDIOGRAM COMPLETE  Result Date: 05/06/2021    ECHOCARDIOGRAM REPORT   Patient Name:   SONORA CATLIN Date of Exam: 05/06/2021 Medical Rec #:  [id number]       Height:       67.0 in  Accession #:    [id number]      Weight:       205.9 lb Date of Birth:  Apr 12, 1933       BSA:          2.047 m Patient Age:    78 years        BP:           122/68 mmHg Patient Gender: F               HR:           73 bpm. Exam Location:  Inpatient Procedure: 2D Echo, 3D Echo and Strain Analysis Indications:    Chemotherapy  History:        Patient has prior history of Echocardiogram examinations, most                 recent 12/03/2017. Risk Factors:Hypertension and Diabetes.  Sonographer:    Jefferey Pica Referring Phys: [id number] [place]  1. Left ventricular ejection fraction, by estimation, is 60 to 65%. The left ventricle has normal function. The left ventricle has no regional wall motion abnormalities. There is severe left ventricular hypertrophy. Left ventricular diastolic parameters  are consistent with Grade II diastolic dysfunction (pseudonormalization). Elevated left ventricular end-diastolic pressure. The average left ventricular global longitudinal strain is -22.9 %. The global longitudinal strain is normal.  2. Right ventricular systolic function is normal. The right ventricular size is normal.  3. Left atrial size was moderately dilated.  4. The mitral valve is degenerative. No evidence of mitral valve regurgitation. No evidence of mitral stenosis. Moderate mitral annular calcification.  5. The aortic valve is tricuspid. There is mild calcification of the aortic valve. There is mild thickening of the aortic valve. Aortic valve regurgitation is not visualized. Aortic valve sclerosis is present, with no evidence of aortic valve stenosis.  6. The inferior vena cava is normal in size with greater than 50% respiratory variability, suggesting right atrial pressure of 3 mmHg. FINDINGS  Left Ventricle: Left ventricular ejection fraction, by estimation, is 60 to 65%. The left ventricle has normal function. The left ventricle has no regional wall motion abnormalities. The average left  ventricular global longitudinal strain is -22.9 %. The global longitudinal strain is normal. The left ventricular internal cavity size was normal in size. There is severe left ventricular hypertrophy. Left ventricular diastolic parameters are consistent with Grade II diastolic dysfunction (pseudonormalization). Elevated left ventricular end-diastolic pressure. Right Ventricle: The right ventricular size is normal. No increase in right ventricular wall thickness. Right ventricular systolic function is normal. Left Atrium: Left atrial size was moderately dilated. Right Atrium: Right atrial size was normal in size. Pericardium: There is no evidence of pericardial effusion. Mitral Valve: The mitral valve is degenerative  in appearance. There is mild thickening of the mitral valve leaflet(s). There is mild calcification of the mitral valve leaflet(s). Moderate mitral annular calcification. No evidence of mitral valve regurgitation. No evidence of mitral valve stenosis. Tricuspid Valve: The tricuspid valve is normal in structure. Tricuspid valve regurgitation is not demonstrated. No evidence of tricuspid stenosis. Aortic Valve: The aortic valve is tricuspid. There is mild calcification of the aortic valve. There is mild thickening of the aortic valve. Aortic valve regurgitation is not visualized. Aortic valve sclerosis is present, with no evidence of aortic valve stenosis. Aortic valve peak gradient measures 8.9 mmHg. Pulmonic Valve: The pulmonic valve was normal in structure. Pulmonic valve regurgitation is not visualized. No evidence of pulmonic stenosis. Aorta: The aortic root is normal in size and structure. Venous: The inferior vena cava is normal in size with greater than 50% respiratory variability, suggesting right atrial pressure of 3 mmHg. IAS/Shunts: No atrial level shunt detected by color flow Doppler.  LEFT VENTRICLE PLAX 2D LVIDd:         3.00 cm      Diastology LVIDs:         2.60 cm      LV e' lateral:    6.15 cm/s LV PW:         1.05 cm      LV E/e' lateral: 15.6 LV IVS:        3.15 cm LVOT diam:     2.00 cm      2D Longitudinal Strain LV SV:         93           2D Strain GLS Avg:     -22.9 % LV SV Index:   46 LVOT Area:     3.14 cm  LV Volumes (MOD) LV vol d, MOD A4C: 109.0 ml LV vol s, MOD A4C: 49.4 ml LV SV MOD A4C:     109.0 ml RIGHT VENTRICLE             IVC RV Basal diam:  2.90 cm     IVC diam: 1.70 cm RV S prime:     16.10 cm/s TAPSE (M-mode): 2.1 cm LEFT ATRIUM             Index        RIGHT ATRIUM           Index LA diam:        4.30 cm 2.10 cm/m   RA Area:     19.60 cm LA Vol (A2C):   82.8 ml 40.44 ml/m  RA Volume:   53.40 ml  26.08 ml/m LA Vol (A4C):   93.3 ml 45.57 ml/m LA Biplane Vol: 94.7 ml 46.25 ml/m  AORTIC VALVE                 PULMONIC VALVE AV Area (Vmax): 2.84 cm     PV Vmax:       0.83 m/s AV Vmax:        149.50 cm/s  PV Peak grad:  2.8 mmHg AV Peak Grad:   8.9 mmHg LVOT Vmax:      135.00 cm/s LVOT Vmean:     89.400 cm/s LVOT VTI:       0.297 m  AORTA Ao Root diam: 3.00 cm Ao Asc diam:  3.20 cm MITRAL VALVE MV Area (PHT): 3.23 cm    SHUNTS MV Decel Time: 235 msec    Systemic VTI:  0.30 m MV E velocity:  96.10 cm/s  Systemic Diam: 2.00 cm MV A velocity: 88.10 cm/s MV E/A ratio:  1.09 Jenkins Rouge MD Electronically signed by Jenkins Rouge MD Signature Date/Time: 05/06/2021/11:33:51 AM    Final      ASSESSMENT: 88 y.o. [place] woman  (1) status post right lumpectomy and axillary dissection in 1993 in [place] for what appears to have been a stage I breast cancer, treated with radiation and then tamoxifen for 5 years  (2) status post right mastectomy 2002 for a recurrence in the right breast, treated adjuvantly with tamoxifen for an additional 5 years  (3) status post left mastectomy and sentinel lymph node sampling 03/07/2003 for a lower inner quadrant pT1b, pN0, stage IA mucinous breast cancer, grade 2; no radiation therapy was given. Tamoxifen was continued until  2009  RECURRENT DISEASE/ LEFT: OCTOBER 2014 (4) excisional biopsy of a left chest wall mass 01/26/2013 showing carcinoma consistent with invasive ductal carcinoma, estrogen receptor 100% positive, progesterone receptor 0% positive, with an MIB-1 of 22% and no HER-2 amplification.   (5)  PET scan on 03/03/2013 confirmed extensive metastatic adenopathy in the left axilla, left subpectoral musculature, and left supraclavicular region  (6)  started on anastrozole daily beginning 03/01/2013, interrupted during radiation therapy, resumed March 2015, discontinued March 2016 because of fatigue and arthralgias  (7) radiation therapy 03/31/2013 through 05/23/2013 Site/dose:   The patient was treated initially with a forward treatment planning technique to the left chest wall in addition to treatment to the supraclavicular region. This consisted of a 3-D conformal technique. The patient was treated in this fashion to a dose of 50.4 gray. The patient then received a 14 gray boost treatment using an electron field. The total dose was 64.4 gray.  (8) Osteopenia, zometa yearly started 07/27/2014, but poorly tolerated.   (a) bone density scan on 12/27/13 showed a t-score of -1.8   (b) started Denosumab/Prolia April 2017, canceled after one dose per patient  (9) tamoxifen started 07/27/2014, stopped November 2019 (had total 5 years of antiestrogens)  (10) likely thalassemia trait  (11) left subpectoral soft tissue nodule noted 07/17/2015 unchanged through serial scans, most recent 40/97/3532, with no metabolic activity noted in PET scan of 2016 and interval calcification.  This is presumed benign  (12)  ADDITIONAL REGIONAL RECURRENCE:  (A) CT of the chest with contrast 03/05/2021 obtained to evaluate left upper extremity lymphedema as showed acute pulmonary emboli.  There was also left axillary and subpectoral lymphadenopathy  (B) rivaroxaban started 03/05/2021  (C) left breast ultrasound 03/29/2021 shows 2  areas in the infraclavicular tissue consistent with fat necrosis.  There were however multiple retropectoral interpectoral and left axillary masses.  (D) left axillary lymph node biopsy 04/04/2021  She had biopsy of the left axillary lymph node mass which showed invasive ductal carcinoma, grade 3, ER positive, 100%, strong staining intensity, PR positive, 20%, weak staining intensity, Ki-67 of 15%, HER2 positive by IHC, ratio 2.90 She was initially scheduled for fulvestrant however given her to positive breast cancer, we have discussed about additional therapy today.  PLAN:   I have discussed the biology of the breast cancer being different from her previous breast cancers.  We have discussed about considering fulvestrant along with the trastuzumab given her HER2 amplified breast cancer.  I have explained to her pathogenesis of HER2 amplified breast cancer, mechanism of action of trastuzumab as well as adverse effects of trastuzumab in detail.   We have discussed about cardiotoxicity from trastuzumab and the need for echocardiogram  on a periodic basis.  Echo ordered today. I have discussed about proceeding with systemic scans since there is concern for metastatic cancer, patient also has new onset low back pain.  If she has trouble making it to multiple appointments given different schedules on fulvestrant and trastuzumab, we could also consider doing trastuzumab and exemestane while we are awaiting additional molecular testing (in case she is metastatic) She understands that there could be aggressive treatment strategies for this particular breast cancer however given her age, comorbidities and her current performance status, it is reasonable to try single agent trastuzumab and evaluate her tolerance and response and act accordingly. I have explained to the patient that it is unlikely that anticoagulation with Xarelto has been causing her low back pain, she should continue anticoagulation as prescribed,  prescription refill has been sent to pharmacy of her choice We will arrange for trastuzumab infusions every 21 days, she will continue fulvestrant and return to clinic in 3 to 4 weeks.  Trastuzumab and fulvestrant Imaging Continue Xarelto Echo to be scheduled  Total encounter time 45 minutes.   *Total Encounter Time as defined by the Centers for Medicare and Medicaid Services includes, in addition to the face-to-face time of a patient visit (documented in the note above) non-face-to-face time: obtaining and reviewing outside history, ordering and reviewing medications, tests or procedures, care coordination (communications with other health care professionals or caregivers) and documentation in the medical record.

## 2021-05-08 NOTE — Progress Notes (Signed)
ID: [name] OB: 1932-07-06  MR#: [id number]  CSN#:[id number]  Patient Care Team: [name], MD as PCP - General (Family Medicine) Gentry Fitz, MD as Consulting Physician (Family Medicine) Rockwell Germany, RN as Oncology Nurse Navigator Mauro Kaufmann, RN as Oncology Nurse Navigator Benay Pike, MD as Consulting Physician (Hematology and Oncology) OTHER MD:   CHIEF COMPLAINT:  Recurrent breast cancer  CURRENT TREATMENT: faslodex and herceptin   INTERVAL HISTORY:  Patient is here for follow-up.  She denies any new health complaints.  She is hoping to get started on treatment.  She did not get her Faslodex last week.  She had her first dose on 04/18/2021 and no side effects reported.  Since her last visit, she had imaging.  She has also been taking her blood thinner as prescribed.  No new bleeding issues, chest pain or worsening shortness of breath. She had an echocardiogram 2 days ago.  She is curious to get started on treatment.  BREAST CANCER HISTORY: From the earlier summary:  Hien's history of breast cancer dates back to 1993, when she underwent biopsy of her right breast in [place]. I do not have those details, but she moved to [place] shortly thereafter and it was in [place] where she had her right lumpectomy and axillary lymph node dissection. She had a total of 6 lymph nodes removed, she tells me, and all of them were clear. She does not recall the size of the primary. She was treated with adjuvant radiation and then received tamoxifen between 1993 and 1998.  In 2002 she had a local recurrence in the right breast, and underwent right mastectomy. She received tamoxifen between 2002 and 2007.  In 2004 she underwent a left mastectomy and sentinel lymph node sampling for a 9 mm mucinous carcinoma, grade 2, multifocal, associated with ductal carcinoma in situ. The single sentinel lymph node was negative. Margins were ample. She did not receive adjuvant  radiation, but continued on tamoxifen as noted above, until 2007.  More recently, in July of 2014, the patient noted a "pimple" in the left chest wall, superior to her prior mastectomy incision. She brought this to the attention of her surgeon, Dr. Ninfa Linden and he describes a dimple area with a firm mass underneath. He also describes a large lipoma in the left clavicular area and a second one on her abdominal wall. Both of these were symptomatic. All 3 masses were removed 01/26/2013. 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 The lesion abutted the closest inked resection margin. The other 2 masses proved to be lipomas.  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   Diabetes mellitus    fasting 90-100   HTN (hypertension) 03/25/2013   Hx of radiation therapy    breasts hx   Hypercholesterolemia    Hypertension    Lymphedema of arm    left arm   Pulmonary embolism ([place])    Type II or unspecified type diabetes mellitus without mention of complication, not stated as uncontrolled 03/25/2013    PAST SURGICAL HISTORY: Past Surgical History:  Procedure Laterality Date   ABDOMINAL HYSTERECTOMY     ANKLE SURGERY Right 1995  APPENDECTOMY     BIOPSY  01/07/2018   Procedure: BIOPSY;  Surgeon: [name], MD;  Location: [facility];  Service: Gastroenterology;;   BREAST SURGERY Bilateral    mastectomy   COLONOSCOPY WITH PROPOFOL N/A 01/07/2018   Procedure: COLONOSCOPY WITH PROPOFOL;  Surgeon: [name], MD;  Location: [facility];  Service: Gastroenterology;  Laterality: N/A;   ESOPHAGOGASTRODUODENOSCOPY (EGD) WITH PROPOFOL N/A 01/06/2018   Procedure: ESOPHAGOGASTRODUODENOSCOPY (EGD) WITH  PROPOFOL;  Surgeon: [name], MD;  [facility];  Service: Gastroenterology;  Laterality: N/A;   HERNIA REPAIR  04-26-2010   MASS EXCISION Left 01/26/2013   Procedure: EXCISION LEFT CHEST WALL MASS AND LEFT ABDOMNAL WALL MASS;  Surgeon: Harl Bowie, MD;  Location: [facility];  Service: General;  Laterality: Left;   MASS EXCISION Left 09/20/2014   Procedure: EXCISION OF LEFT CHEST WALL MASS;  Surgeon: Coralie Keens, MD;  Location: [facility];  Service: General;  Laterality: Left;   TEE WITHOUT CARDIOVERSION N/A 12/08/2017   Procedure: TRANSESOPHAGEAL ECHOCARDIOGRAM (TEE);  Surgeon: Lelon Perla, MD;  Location: [facility];  Service: Cardiovascular;  Laterality: N/A;   TOTAL HIP ARTHROPLASTY Right 01/05/2017   TOTAL HIP ARTHROPLASTY Right 01/05/2017   Procedure: TOTAL HIP ARTHROPLASTY ANTERIOR APPROACH;  Surgeon: Frederik Pear, MD;  Location: [facility];  Service: Orthopedics;  Laterality: Right;    FAMILY HISTORY The patient's father died from a heart attack at the age of 40. The patient's mother died at the age of 76 from myocarditis. The patient had no brothers, one sister. There is no history of breast or ovarian cancer in the family to her knowledge   GYNECOLOGIC HISTORY:   (Reviewed 09/22/2013) Menarche age 24, first live birth age 89, the patient is Anderson P5. She underwent hysterectomy in 1984. She did not use hormone replacement.   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 Her husband Radiographer, therapeutic used to work in Radio producer but is now retired. Their children are: Reed Breech who lives in [place] and teaches theater there; Julius Bowels who works as a Social worker in [place]; Archivist who lives in [place] and is a "radio personality"; Elta Guadeloupe, who is a Biomedical scientist in [place], and Legrand Como who is a musician living in [place]. The patient has 2 grandchildren. She attends a [employer]    ADVANCED DIRECTIVES: Not in  place   HEALTH MAINTENANCE:  Social History   Tobacco Use   Smoking status: Former    Types: Cigarettes    Quit date: 04/14/1972    Years since quitting: 49.0   Smokeless tobacco: Never  Vaping Use   Vaping Use: Never used  Substance Use Topics   Alcohol use: Yes    Comment: occasional   Drug use: Yes    Colonoscopy: 2011  PAP: Status post hysterectomy  Bone density: 1993 ; repeat 12/27/2013 showed osteopenia with a T score of -1.8  Lipid panel: not on file  Allergies  Allergen Reactions   Bactrim Swelling    SWELLING OF MOUTH/FACE.   Lisinopril Swelling    SWELLING OF MOUTH/FACE.   Vasotec Swelling    SWELLING OF MOUTH/FACE.   Sulfamethoxazole-Trimethoprim     Other reaction(s): Unknown    Current Outpatient Medications  Medication Sig Dispense Refill   amLODipine (NORVASC) 10 MG tablet Take 10 mg by mouth daily with breakfast.      diclofenac Sodium (VOLTAREN) 1 % GEL Apply 2 g topically daily as needed (For topical arthritis.).  HUMULIN N 100 UNIT/ML injection Inject 30 Units into the skin daily.     metFORMIN (GLUCOPHAGE) 500 MG tablet Take 500 mg by mouth daily with breakfast.     olmesartan-hydrochlorothiazide (BENICAR HCT) 40-12.5 MG tablet Take 1 tablet by mouth daily.     rivaroxaban (XARELTO) 20 MG TABS tablet Take 1 tablet (20 mg total) by mouth daily with supper. 90 tablet 3   SM IRON 325 (65 Fe) MG tablet Take 325 mg by mouth daily.     Turmeric (QC TUMERIC COMPLEX PO) Take 1 tablet by mouth daily.     TYLENOL 325 MG tablet Take 325 mg by mouth daily.     VITAMIN D PO Take 1 tablet by mouth daily.     No current facility-administered medications for this visit.    OBJECTIVE: African-American woman who appears stated age  56:   05/08/21 1418  BP: (!) 165/63  Pulse: 92  Resp: 18  Temp: (!) 97.3 F (36.3 C)  SpO2: 100%     Body mass index is 32.66 kg/m.    ECOG FS: 2 Filed Weights   05/08/21 1418  Weight: 208 lb 8 oz (94.6 kg)    Sclerae unicteric, EOMs intact Wearing a mask She has small palpable 1/3-1 /2 centimeter spots along the left clavicular area, which are unchanged from baseline Lungs no rales or rhonchi Heart regular rate and rhythm Abd soft, obese, nontender, positive bowel sounds MSK no focal spinal tenderness, left upper extremity lymphedema with sleeve in place Neuro: nonfocal, well oriented, appropriate affect Breasts: Status post bilateral mastectomies.  I do not feel palpable adenopathy in the left axilla.   LAB RESULTS:  Lab Results  Component Value Date   WBC 6.5 04/30/2021   NEUTROABS 5.0 04/28/2021   HGB 11.3 (L) 04/30/2021   HCT 36.4 04/30/2021   MCV 80.2 04/30/2021   PLT 198 04/30/2021      Chemistry      Component Value Date/Time   NA 138 04/30/2021 0334   NA 139 01/20/2017 1057   K 3.3 (L) 04/30/2021 0334   K 3.9 01/20/2017 1057   CL 103 04/30/2021 0334   CO2 27 04/30/2021 0334   CO2 25 01/20/2017 1057   BUN 8 04/30/2021 0334   BUN 15.6 01/20/2017 1057   CREATININE 0.70 04/30/2021 0334   CREATININE 0.74 04/18/2021 1023   CREATININE 0.7 01/20/2017 1057      Component Value Date/Time   CALCIUM 8.6 (L) 04/30/2021 0334   CALCIUM 9.3 01/20/2017 1057   ALKPHOS 55 04/28/2021 0702   ALKPHOS 69 01/20/2017 1057   AST 14 (L) 04/28/2021 0702   AST 10 (L) 04/18/2021 1023   AST 10 01/20/2017 1057   ALT 8 04/28/2021 0702   ALT 5 04/18/2021 1023   ALT 14 01/20/2017 1057   BILITOT 0.9 04/28/2021 0702   BILITOT 0.5 04/18/2021 1023   BILITOT 0.43 01/20/2017 1057      STUDIES: DG Abd 1 View  Result Date: 04/28/2021 CLINICAL DATA:  Check nasogastric catheter placement EXAM: ABDOMEN - 1 VIEW COMPARISON:  Film from earlier in the same day. FINDINGS: Gastric catheter has been advanced and now lies within the stomach. No obstructive changes are seen at this time. No free air is noted. IMPRESSION: Gastric catheter now lies within the stomach in appropriate position. Electronically  Signed   By: Inez Catalina M.D.   On: 04/28/2021 00:12   DG Abd 1 View  Result Date: 04/27/2021 CLINICAL DATA:  NGT placement. EXAM: ABDOMEN - 1 VIEW COMPARISON:  CT with IV contrast earlier today. FINDINGS: NGT is in place with the tip just below the hiatus and the side hole in the distal esophagus and needs to be advanced further in 12-15 cm for better positioning. Imaging includes the upper to mid abdomen excluding the true pelvis. The bowel pattern again demonstrating evidence of small-bowel obstruction with small bowel dilatation up to 3.6 cm. There is no supine evidence of free air. Visceral shadows are stable; no appreciable pathologic calcification. Cardiomegaly. IMPRESSION: 1. Enteric tube should be advanced further in 12-15 cm. 2. Small bowel dilatation is unchanged. Electronically Signed   By: Telford Nab M.D.   On: 04/27/2021 22:57   CT Chest W Contrast  Result Date: 05/07/2021 CLINICAL DATA:  Breast cancer, ongoing chemotherapy. EXAM: CT CHEST, ABDOMEN, AND PELVIS WITH CONTRAST TECHNIQUE: Multidetector CT imaging of the chest, abdomen and pelvis was performed following the standard protocol during bolus administration of intravenous contrast. RADIATION DOSE REDUCTION: This exam was performed according to the departmental dose-optimization program which includes automated exposure control, adjustment of the mA and/or kV according to patient size and/or use of iterative reconstruction technique. CONTRAST:  143m OMNIPAQUE IOHEXOL 300 MG/ML  SOLN COMPARISON:  03/05/2021. FINDINGS: CT CHEST FINDINGS Cardiovascular: Atherosclerotic calcification of the aorta, aortic valve and coronary arteries. Pulmonic trunk and heart are enlarged. No pericardial effusion. Mediastinum/Nodes: 2.4 cm low-attenuation left thyroid nodule. No follow-up recommended unless clinically warranted. (Ref: J Am Coll Radiol. 2015 Feb;12(2): 143-50). 10 mm left supraclavicular lymph node (2/11), similar. 1.8 cm subcarinal lymph  node unchanged. Right hilar lymph nodes measure up to 12 mm, also similar. Calcified left subpectoral mass measures 1.9 x 3.5 cm, stable. Left axillary adenopathy measures up to 2.2 x 4.1 cm (2/21), stable. Surgical clips in the right axilla. Esophagus is grossly unremarkable. Lungs/Pleura: Mild paraseptal emphysema. Image quality is degraded by respiratory motion. A few scattered pulmonary nodular densities measure up to 6 mm in the posterior right lower lobe (4/112), as before. Subpleural radiation scarring in the anterior left upper lobe. No pleural fluid. Airway is unremarkable. Musculoskeletal: Degenerative changes in the spine and shoulders. Old left rib fractures. No worrisome lytic or sclerotic lesions. CT ABDOMEN PELVIS FINDINGS Hepatobiliary: Liver is slightly decreased in attenuation diffusely. Tiny stones layer in the gallbladder. No biliary ductal dilatation. Pancreas: Negative. Spleen: Negative. Adrenals/Urinary Tract: Right adrenal gland is unremarkable. Left adrenal gland may be slightly thickened. Subcentimeter low-attenuation lesions in the kidneys are too small to characterize. Kidneys are otherwise unremarkable. Ureters are decompressed. Bladder is grossly unremarkable. Stomach/Bowel: Tiny hiatal hernia. Stomach is unremarkable. Duodenal diverticula are incidentally noted. Small bowel and colon are otherwise unremarkable. Patient is reportedly status post appendectomy. Vascular/Lymphatic: Atherosclerotic calcification of the aorta. Dilated celiac trunk, measuring up to 1.3 cm, suggesting a proximal stenosis. No pathologically enlarged lymph nodes. Reproductive: Hysterectomy.  No adnexal mass. Other: No free fluid.  Mesenteries and peritoneum are unremarkable. Musculoskeletal: Right hip arthroplasty. Degenerative changes in the spine and hips. No worrisome lytic or sclerotic lesions. IMPRESSION: 1. Left axillary, left subpectoral, left supraclavicular and mediastinal adenopathy is stable. 2.  Scattered tiny pulmonary nodules, stable. 3. Hepatic steatosis. 4. Cholelithiasis. 5. Dilated celiac trunk, suggesting a proximal stenosis. 6. Aortic atherosclerosis (ICD10-I70.0). Coronary artery calcification. 7. Enlarged pulmonic trunk, indicative of pulmonary arterial hypertension. 8.  Emphysema (ICD10-J43.9). Electronically Signed   By: MLorin PicketM.D.   On: 05/07/2021 10:20   NM Bone Scan Whole Body  Result Date: 05/08/2021 CLINICAL DATA:  Breast cancer restaging. EXAM: NUCLEAR MEDICINE WHOLE BODY BONE SCAN TECHNIQUE: Whole body anterior and posterior images were obtained approximately 3 hours after intravenous injection of radiopharmaceutical. RADIOPHARMACEUTICALS:  20.5 mCi Technetium-39mMDP IV COMPARISON:  CT imaging May 06, 2021 FINDINGS: Uptake in the thoracic and lumbar spine correlates with degenerative changes on recent CT imaging. Mild symmetric uptake in the SI joints is consistent with degenerative changes well. Significant degenerative changes are seen in the shoulders. Degenerative changes seen in the knees. Degenerative changes seen at the right sternoclavicular junction and the sternomanubrial junction. Mild uptake is associated with the right hip replacement. Uptake in multiple adjacent left ribs consistent with known healed fractures. No convincing scintigraphic evidence of bony metastatic disease. IMPRESSION: No scintigraphic evidence of bony metastatic disease. Electronically Signed   By: DDorise BullionIII M.D.   On: 05/08/2021 07:06   CT Abdomen Pelvis W Contrast  Result Date: 05/07/2021 CLINICAL DATA:  Breast cancer, ongoing chemotherapy. EXAM: CT CHEST, ABDOMEN, AND PELVIS WITH CONTRAST TECHNIQUE: Multidetector CT imaging of the chest, abdomen and pelvis was performed following the standard protocol during bolus administration of intravenous contrast. RADIATION DOSE REDUCTION: This exam was performed according to the departmental dose-optimization program which  includes automated exposure control, adjustment of the mA and/or kV according to patient size and/or use of iterative reconstruction technique. CONTRAST:  1069mOMNIPAQUE IOHEXOL 300 MG/ML  SOLN COMPARISON:  03/05/2021. FINDINGS: CT CHEST FINDINGS Cardiovascular: Atherosclerotic calcification of the aorta, aortic valve and coronary arteries. Pulmonic trunk and heart are enlarged. No pericardial effusion. Mediastinum/Nodes: 2.4 cm low-attenuation left thyroid nodule. No follow-up recommended unless clinically warranted. (Ref: J Am Coll Radiol. 2015 Feb;12(2): 143-50). 10 mm left supraclavicular lymph node (2/11), similar. 1.8 cm subcarinal lymph node unchanged. Right hilar lymph nodes measure up to 12 mm, also similar. Calcified left subpectoral mass measures 1.9 x 3.5 cm, stable. Left axillary adenopathy measures up to 2.2 x 4.1 cm (2/21), stable. Surgical clips in the right axilla. Esophagus is grossly unremarkable. Lungs/Pleura: Mild paraseptal emphysema. Image quality is degraded by respiratory motion. A few scattered pulmonary nodular densities measure up to 6 mm in the posterior right lower lobe (4/112), as before. Subpleural radiation scarring in the anterior left upper lobe. No pleural fluid. Airway is unremarkable. Musculoskeletal: Degenerative changes in the spine and shoulders. Old left rib fractures. No worrisome lytic or sclerotic lesions. CT ABDOMEN PELVIS FINDINGS Hepatobiliary: Liver is slightly decreased in attenuation diffusely. Tiny stones layer in the gallbladder. No biliary ductal dilatation. Pancreas: Negative. Spleen: Negative. Adrenals/Urinary Tract: Right adrenal gland is unremarkable. Left adrenal gland may be slightly thickened. Subcentimeter low-attenuation lesions in the kidneys are too small to characterize. Kidneys are otherwise unremarkable. Ureters are decompressed. Bladder is grossly unremarkable. Stomach/Bowel: Tiny hiatal hernia. Stomach is unremarkable. Duodenal diverticula are  incidentally noted. Small bowel and colon are otherwise unremarkable. Patient is reportedly status post appendectomy. Vascular/Lymphatic: Atherosclerotic calcification of the aorta. Dilated celiac trunk, measuring up to 1.3 cm, suggesting a proximal stenosis. No pathologically enlarged lymph nodes. Reproductive: Hysterectomy.  No adnexal mass. Other: No free fluid.  Mesenteries and peritoneum are unremarkable. Musculoskeletal: Right hip arthroplasty. Degenerative changes in the spine and hips. No worrisome lytic or sclerotic lesions. IMPRESSION: 1. Left axillary, left subpectoral, left supraclavicular and mediastinal adenopathy is stable. 2. Scattered tiny pulmonary nodules, stable. 3. Hepatic steatosis. 4. Cholelithiasis. 5. Dilated celiac trunk, suggesting a proximal stenosis. 6. Aortic atherosclerosis (ICD10-I70.0). Coronary artery calcification. 7. Enlarged pulmonic  trunk, indicative of pulmonary arterial hypertension. 8.  Emphysema (ICD10-J43.9). Electronically Signed   By: Lorin Picket M.D.   On: 05/07/2021 10:20   CT ABDOMEN PELVIS W CONTRAST  Result Date: 04/27/2021 CLINICAL DATA:  Abdominal pain, acute, nonlocalized. Nausea and vomiting EXAM: CT ABDOMEN AND PELVIS WITH CONTRAST TECHNIQUE: Multidetector CT imaging of the abdomen and pelvis was performed using the standard protocol following bolus administration of intravenous contrast. RADIATION DOSE REDUCTION: This exam was performed according to the departmental dose-optimization program which includes automated exposure control, adjustment of the mA and/or kV according to patient size and/or use of iterative reconstruction technique. CONTRAST:  160m OMNIPAQUE IOHEXOL 300 MG/ML  SOLN COMPARISON:  06/16/2012 FINDINGS: Lower chest: Cardiomegaly.  Included lung bases are clear. Hepatobiliary: No focal liver abnormality. There are a few small layering stones within the gallbladder lumen. No pericholecystic inflammatory changes by CT. No biliary  dilatation. Pancreas: Unremarkable. No pancreatic ductal dilatation or surrounding inflammatory changes. Spleen: Normal in size without focal abnormality. Adrenals/Urinary Tract: Adrenal glands within normal limits. Multiple subcentimeter low-density lesions within both kidneys, too small to definitively characterized. No renal stone or hydronephrosis. Urinary bladder within normal limits for the degree of distension. Stomach/Bowel: Small hiatal hernia. Stomach otherwise within normal limits. Small-bowel obstruction with transition point located within patient's inferior ventral abdominal wall hernia (series 5, image 84). Fecalization of small bowel content upstream from the site of obstruction. The small bowel and colon distal to the site of obstruction is decompressed. Extensive diverticulosis, most pronounced within the sigmoid colon Vascular/Lymphatic: Aortic atherosclerosis. No enlarged abdominal or pelvic lymph nodes. Reproductive: Status post hysterectomy. No adnexal masses. Other: Inferior ventral abdominal wall hernia with scarring along the anteroinferior margin of the hernia sac. No ascites. No organized abdominopelvic fluid collection. No pneumoperitoneum. Musculoskeletal: Partially visualized right hip arthroplasty. Advanced degenerative changes of the pubic symphysis with small joint effusion, nonspecific. Advanced degenerative disc disease and facet arthropathy of the lumbar spine. Multilevel bridging endplate osteophytes within the lower thoracic spine. IMPRESSION: 1. Small-bowel obstruction with transition point located within patient's inferior ventral abdominal wall hernia. 2. Extensive colonic diverticulosis without evidence of acute diverticulitis. 3. Cholelithiasis without evidence of acute cholecystitis. Aortic Atherosclerosis (ICD10-I70.0). Electronically Signed   By: NDavina PokeD.O.   On: 04/27/2021 15:22   DG Abd Portable 1V-Small Bowel Obstruction Protocol-initial, 8 hr  delay  Result Date: 04/28/2021 CLINICAL DATA:  Gastrografin given at 1230, there was miscommunication about when the contrast was given so patient missed their 8 hour delay. EXAM: PORTABLE ABDOMEN - 1 VIEW COMPARISON:  None. FINDINGS: PO contrast reaches the rectum. The bowel gas pattern is normal. No radio-opaque calculi or other significant radiographic abnormality are seen. Enteric tube with tip coursing below the hemidiaphragm overlying the expected region of the gastric antrum/pyloric region. Partially visualized total right hip arthroplasty. IMPRESSION: 1. PO contrast reaches the rectum. 2. Enteric tube in grossly appropriate position. Electronically Signed   By: MIven FinnM.D.   On: 04/28/2021 23:21   ECHOCARDIOGRAM COMPLETE  Result Date: 05/06/2021    ECHOCARDIOGRAM REPORT   Patient Name:   MVINISHA FAXONDate of Exam: 05/06/2021 Medical Rec #:  [id number]      Height:       67.0 in Accession #:    [id number]     Weight:       205.9 lb Date of Birth:  709-16-1934      BSA:  2.047 m Patient Age:    24 years        BP:           122/68 mmHg Patient Gender: F               HR:           73 bpm. Exam Location:  Inpatient Procedure: 2D Echo, 3D Echo and Strain Analysis Indications:    Chemotherapy  History:        Patient has prior history of Echocardiogram examinations, most                 recent 12/03/2017. Risk Factors:Hypertension and Diabetes.  Sonographer:    Jefferey Pica Referring Phys: [id number] [place]  1. Left ventricular ejection fraction, by estimation, is 60 to 65%. The left ventricle has normal function. The left ventricle has no regional wall motion abnormalities. There is severe left ventricular hypertrophy. Left ventricular diastolic parameters  are consistent with Grade II diastolic dysfunction (pseudonormalization). Elevated left ventricular end-diastolic pressure. The average left ventricular global longitudinal strain is -22.9 %. The global  longitudinal strain is normal.  2. Right ventricular systolic function is normal. The right ventricular size is normal.  3. Left atrial size was moderately dilated.  4. The mitral valve is degenerative. No evidence of mitral valve regurgitation. No evidence of mitral stenosis. Moderate mitral annular calcification.  5. The aortic valve is tricuspid. There is mild calcification of the aortic valve. There is mild thickening of the aortic valve. Aortic valve regurgitation is not visualized. Aortic valve sclerosis is present, with no evidence of aortic valve stenosis.  6. The inferior vena cava is normal in size with greater than 50% respiratory variability, suggesting right atrial pressure of 3 mmHg. FINDINGS  Left Ventricle: Left ventricular ejection fraction, by estimation, is 60 to 65%. The left ventricle has normal function. The left ventricle has no regional wall motion abnormalities. The average left ventricular global longitudinal strain is -22.9 %. The global longitudinal strain is normal. The left ventricular internal cavity size was normal in size. There is severe left ventricular hypertrophy. Left ventricular diastolic parameters are consistent with Grade II diastolic dysfunction (pseudonormalization). Elevated left ventricular end-diastolic pressure. Right Ventricle: The right ventricular size is normal. No increase in right ventricular wall thickness. Right ventricular systolic function is normal. Left Atrium: Left atrial size was moderately dilated. Right Atrium: Right atrial size was normal in size. Pericardium: There is no evidence of pericardial effusion. Mitral Valve: The mitral valve is degenerative in appearance. There is mild thickening of the mitral valve leaflet(s). There is mild calcification of the mitral valve leaflet(s). Moderate mitral annular calcification. No evidence of mitral valve regurgitation. No evidence of mitral valve stenosis. Tricuspid Valve: The tricuspid valve is normal in  structure. Tricuspid valve regurgitation is not demonstrated. No evidence of tricuspid stenosis. Aortic Valve: The aortic valve is tricuspid. There is mild calcification of the aortic valve. There is mild thickening of the aortic valve. Aortic valve regurgitation is not visualized. Aortic valve sclerosis is present, with no evidence of aortic valve stenosis. Aortic valve peak gradient measures 8.9 mmHg. Pulmonic Valve: The pulmonic valve was normal in structure. Pulmonic valve regurgitation is not visualized. No evidence of pulmonic stenosis. Aorta: The aortic root is normal in size and structure. Venous: The inferior vena cava is normal in size with greater than 50% respiratory variability, suggesting right atrial pressure of 3 mmHg. IAS/Shunts: No atrial level shunt detected by color  flow Doppler.  LEFT VENTRICLE PLAX 2D LVIDd:         3.00 cm      Diastology LVIDs:         2.60 cm      LV e' lateral:   6.15 cm/s LV PW:         1.05 cm      LV E/e' lateral: 15.6 LV IVS:        3.15 cm LVOT diam:     2.00 cm      2D Longitudinal Strain LV SV:         93           2D Strain GLS Avg:     -22.9 % LV SV Index:   46 LVOT Area:     3.14 cm  LV Volumes (MOD) LV vol d, MOD A4C: 109.0 ml LV vol s, MOD A4C: 49.4 ml LV SV MOD A4C:     109.0 ml RIGHT VENTRICLE             IVC RV Basal diam:  2.90 cm     IVC diam: 1.70 cm RV S prime:     16.10 cm/s TAPSE (M-mode): 2.1 cm LEFT ATRIUM             Index        RIGHT ATRIUM           Index LA diam:        4.30 cm 2.10 cm/m   RA Area:     19.60 cm LA Vol (A2C):   82.8 ml 40.44 ml/m  RA Volume:   53.40 ml  26.08 ml/m LA Vol (A4C):   93.3 ml 45.57 ml/m LA Biplane Vol: 94.7 ml 46.25 ml/m  AORTIC VALVE                 PULMONIC VALVE AV Area (Vmax): 2.84 cm     PV Vmax:       0.83 m/s AV Vmax:        149.50 cm/s  PV Peak grad:  2.8 mmHg AV Peak Grad:   8.9 mmHg LVOT Vmax:      135.00 cm/s LVOT Vmean:     89.400 cm/s LVOT VTI:       0.297 m  AORTA Ao Root diam: 3.00 cm Ao Asc  diam:  3.20 cm MITRAL VALVE MV Area (PHT): 3.23 cm    SHUNTS MV Decel Time: 235 msec    Systemic VTI:  0.30 m MV E velocity: 96.10 cm/s  Systemic Diam: 2.00 cm MV A velocity: 88.10 cm/s MV E/A ratio:  1.09 Jenkins Rouge MD Electronically signed by Jenkins Rouge MD Signature Date/Time: 05/06/2021/11:33:51 AM    Final      ASSESSMENT: 88 y.o.  woman  (1) status post right lumpectomy and axillary dissection in 1993 in [place] for what appears to have been a stage I breast cancer, treated with radiation and then tamoxifen for 5 years  (2) status post right mastectomy 2002 for a recurrence in the right breast, treated adjuvantly with tamoxifen for an additional 5 years  (3) status post left mastectomy and sentinel lymph node sampling 03/07/2003 for a lower inner quadrant pT1b, pN0, stage IA mucinous breast cancer, grade 2; no radiation therapy was given. Tamoxifen was continued until 2009  RECURRENT DISEASE/ LEFT: OCTOBER 2014 (4) excisional biopsy of a left chest wall mass 01/26/2013 showing carcinoma consistent with invasive ductal carcinoma, estrogen receptor 100% positive, progesterone  receptor 0% positive, with an MIB-1 of 22% and no HER-2 amplification.   (5)  PET scan on 03/03/2013 confirmed extensive metastatic adenopathy in the left axilla, left subpectoral musculature, and left supraclavicular region  (6)  started on anastrozole daily beginning 03/01/2013, interrupted during radiation therapy, resumed March 2015, discontinued March 2016 because of fatigue and arthralgias  (7) radiation therapy 03/31/2013 through 05/23/2013 Site/dose:   The patient was treated initially with a forward treatment planning technique to the left chest wall in addition to treatment to the supraclavicular region. This consisted of a 3-D conformal technique. The patient was treated in this fashion to a dose of 50.4 gray. The patient then received a 14 gray boost treatment using an electron field. The total  dose was 64.4 gray.  (8) Osteopenia, zometa yearly started 07/27/2014, but poorly tolerated.   (a) bone density scan on 12/27/13 showed a t-score of -1.8   (b) started Denosumab/Prolia April 2017, canceled after one dose per patient  (9) tamoxifen started 07/27/2014, stopped November 2019 (had total 5 years of antiestrogens)  (10) likely thalassemia trait  (11) left subpectoral soft tissue nodule noted 07/17/2015 unchanged through serial scans, most recent 50/35/4656, with no metabolic activity noted in PET scan of 2016 and interval calcification.  This is presumed benign  (12)  ADDITIONAL REGIONAL RECURRENCE:  (A) CT of the chest with contrast 03/05/2021 obtained to evaluate left upper extremity lymphedema as showed acute pulmonary emboli.  There was also left axillary and subpectoral lymphadenopathy  (B) rivaroxaban started 03/05/2021  (C) left breast ultrasound 03/29/2021 shows 2 areas in the infraclavicular tissue consistent with fat necrosis.  There were however multiple retropectoral interpectoral and left axillary masses.  (D) left axillary lymph node biopsy 04/04/2021  She had biopsy of the left axillary lymph node mass which showed invasive ductal carcinoma, grade 3, ER positive, 100%, strong staining intensity, PR positive, 20%, weak staining intensity, Ki-67 of 15%, HER2 positive by IHC, ratio 2.90 She was initially scheduled for fulvestrant however given her to positive breast cancer, we have discussed about additional therapy today. CT chest abdomen pelvis showed left axillary, left subpectoral, left supraclavicular and mediastinal adenopathy, stable compared to November.  Bone scan without any evidence of disease. Baseline echo with normal ejection fraction of 60 to 65%, left ventricular hypertrophy.  PLAN:   I have discussed the biology of the breast cancer being different from her previous breast cancers.  We have discussed about considering fulvestrant along with the  trastuzumab given her HER2 amplified breast cancer.  I have explained to her pathogenesis of HER2 amplified breast cancer, mechanism of action of trastuzumab as well as adverse effects of trastuzumab in detail.   We have discussed about cardiotoxicity from trastuzumab and the need for echocardiogram on a periodic basis.  Echo ordered today. During her last visit, we have discussed about trying Herceptin and fulvestrant since she is not a candidate for aggressive neoadjuvant therapies.  Baseline echocardiogram satisfactory, left ventricular ejection fraction of 60 to 65%. She was curious about the status of pulmonary embolism, I could still see it on the most recent scan but significantly improved compared to the original CT in November.  She should continue anticoagulation as prescribed. We have to get fulvestrant and trastuzumab scheduled, currently fulvestrant is going to be every 28 days after first 3 doses and trastuzumab will be continued on every 21 days.  I gave her the option to do both every 28 days, she is comfortable with coming  to multiple appointments.  She wondered if all these treatments can be done today however since trastuzumab has not yet been authorized, we have to schedule this to a different date.  She will return to clinic in approximately 3 weeks with APP in 6 weeks with me.  Referral will be made to PT for lymphedema management.  Total encounter time 45 minutes.   *Total Encounter Time as defined by the Centers for Medicare and Medicaid Services includes, in addition to the face-to-face time of a patient visit (documented in the note above) non-face-to-face time: obtaining and reviewing outside history, ordering and reviewing medications, tests or procedures, care coordination (communications with other health care professionals or caregivers) and documentation in the medical record.

## 2021-05-09 ENCOUNTER — Encounter: Payer: Self-pay | Admitting: *Deleted

## 2021-05-09 ENCOUNTER — Inpatient Hospital Stay: Payer: Medicare Other

## 2021-05-09 ENCOUNTER — Inpatient Hospital Stay: Payer: Medicare Other | Admitting: Genetic Counselor

## 2021-05-15 ENCOUNTER — Other Ambulatory Visit: Payer: Self-pay

## 2021-05-15 ENCOUNTER — Ambulatory Visit: Payer: Medicare Other | Attending: Hematology and Oncology | Admitting: Rehabilitation

## 2021-05-15 ENCOUNTER — Encounter: Payer: Self-pay | Admitting: Rehabilitation

## 2021-05-15 DIAGNOSIS — Z170 Estrogen receptor positive status [ER+]: Secondary | ICD-10-CM | POA: Insufficient documentation

## 2021-05-15 DIAGNOSIS — C50312 Malignant neoplasm of lower-inner quadrant of left female breast: Secondary | ICD-10-CM | POA: Insufficient documentation

## 2021-05-15 DIAGNOSIS — I890 Lymphedema, not elsewhere classified: Secondary | ICD-10-CM | POA: Insufficient documentation

## 2021-05-15 DIAGNOSIS — I972 Postmastectomy lymphedema syndrome: Secondary | ICD-10-CM | POA: Insufficient documentation

## 2021-05-15 NOTE — Progress Notes (Signed)
Pharmacist Chemotherapy Monitoring - Initial Assessment    Anticipated start date: 05/22/21   The following has been reviewed per standard work regarding the patient's treatment regimen: The patient's diagnosis, treatment plan and drug doses, and organ/hematologic function Lab orders and baseline tests specific to treatment regimen  The treatment plan start date, drug sequencing, and pre-medications Prior authorization status  Patient's documented medication list, including drug-drug interaction screen and prescriptions for anti-emetics and supportive care specific to the treatment regimen The drug concentrations, fluid compatibility, administration routes, and timing of the medications to be used The patient's access for treatment and lifetime cumulative dose history, if applicable  The patient's medication allergies and previous infusion related reactions, if applicable   Changes made to treatment plan:  treatment plan date  Follow up needed:  N/A  The following biosimilar Kanjinti (trastuzumab-anns) has been selected for use in this patient.    Kennith Center, Pharm.D., CPP 05/15/2021@2 :36 PM

## 2021-05-15 NOTE — Therapy (Addendum)
 [facility] @ [facility] [address] [place], [place], [address] Phone: [phone]   Fax:  [phone]  Physical Therapy Re-Evaluation  Patient Details  Name: Tami Schmidt MRN: [id number] Date of Birth: 1933-01-10 Referring Provider (PT): Dr. Al Pimple   Encounter Date: 05/15/2021    Past Medical History:  Diagnosis Date   Arthritis    Breast cancer 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   Diabetes mellitus    fasting 90-100   HTN (hypertension) 03/25/2013   Hx of radiation therapy    breasts hx   Hypercholesterolemia    Hypertension    Lymphedema of arm    left arm   Pulmonary embolism (HCC)    Type II or unspecified type diabetes mellitus without mention of complication, not stated as uncontrolled 03/25/2013    Past Surgical History:  Procedure Laterality Date   ABDOMINAL HYSTERECTOMY     ANKLE SURGERY Right 1995   APPENDECTOMY     BIOPSY  01/07/2018   Procedure: BIOPSY;  Surgeon: Kerin Salen, MD;  Location: [facility];  Service: Gastroenterology;;   BREAST SURGERY Bilateral    mastectomy   COLONOSCOPY WITH PROPOFOL N/A 01/07/2018   Procedure: COLONOSCOPY WITH PROPOFOL;  Surgeon: Kerin Salen, MD;  Location: [facility];  Service: Gastroenterology;  Laterality: N/A;   ESOPHAGOGASTRODUODENOSCOPY (EGD) WITH PROPOFOL N/A 01/06/2018   Procedure: ESOPHAGOGASTRODUODENOSCOPY (EGD) WITH PROPOFOL;  Surgeon: Kerin Salen, MD;  Location: [facility];  Service: Gastroenterology;  Laterality: N/A;   HERNIA REPAIR  04-26-2010   MASS EXCISION Left 01/26/2013   Procedure: EXCISION LEFT CHEST WALL MASS AND LEFT ABDOMNAL WALL MASS;  Surgeon: [name], MD;  Location: [facility] OR;  Service: General;  Laterality: Left;   MASS EXCISION Left 09/20/2014   Procedure: EXCISION OF LEFT CHEST WALL MASS;  Surgeon: [name], MD;  Location: [facility];   Service: General;  Laterality: Left;   TEE WITHOUT CARDIOVERSION N/A 12/08/2017   Procedure: TRANSESOPHAGEAL ECHOCARDIOGRAM (TEE);  Surgeon: Lewayne Bunting, MD;  Location: [facility] ENDOSCOPY;  Service: Cardiovascular;  Laterality: N/A;   TOTAL HIP ARTHROPLASTY Right 01/05/2017   TOTAL HIP ARTHROPLASTY Right 01/05/2017   Procedure: TOTAL HIP ARTHROPLASTY ANTERIOR APPROACH;  Surgeon: Gean Birchwood, MD;  Location: [facility] OR;  Service: Orthopedics;  Laterality: Right;    There were no vitals filed for this visit.    Subjective Assessment - 05/15/21 1417     Subjective The doctor wanted me to come back I guess.  My night garment works.  My sleeve is tight but okay.    Pertinent History right breast cancer in 1993 with lumpectomy, 6 nodes and radiation, She had recurrance of right breast cancer in 2002 with mastectomy, left breast cancer in 2004 with mastectomy with one node removal She has left chest wall cancer in 2014 with removal and deloped lymphedema in her left arm after that surgery .  Past history includes right THR and diabetes. CT 03/05/21 showed PE, Lt axillary and subpectoral lymphadenopathy and multiple masses showing IDC ER positive.  Will be getting fulvestrant and herceptin    Currently in Pain? No/denies                [facility] PT Assessment - 05/15/21 0001       Assessment   Medical Diagnosis Left breast cancer    Referring Provider (PT) Dr. Al Pimple  Onset Date/Surgical Date 04/14/02    Hand Dominance Right    Next MD Visit Friday    Prior Therapy yes      Precautions   Precaution Comments active cancer, bil lymphedema risk, Lt UE lymphedema      Balance Screen   Has the patient fallen in the past 6 months No    Has the patient had a decrease in activity level because of a fear of falling?  No    Is the patient reluctant to leave their home because of a fear of falling?  No      Home Environment   Living Environment Private residence    Living Arrangements  Spouse/significant other    Type of Home House      Prior Function   Level of Independence Independent with household mobility with device    Vocation Retired      Observation/Other Assessments   Observations Lt UE still with evident lymphedema - more firm in the forearm      Palpation   Palpation comment +2 pitting edema forearm from wrist to elbow with doughy fibrotic texture during palpation, upper arm soft               LYMPHEDEMA/ONCOLOGY QUESTIONNAIRE - 05/15/21 0001       Type   Cancer Type bilateral breast cancer      Surgeries   Mastectomy Date 04/14/02    Other Surgery Date 04/14/12      Date Lymphedema/Swelling Started   Date 04/14/12      Treatment   Active Chemotherapy Treatment No    Past Chemotherapy Treatment Yes    Active Radiation Treatment No    Past Radiation Treatment Yes      What other symptoms do you have   Are you Having Heaviness or Tightness Yes    Are you having Pain No    Are you having pitting edema Yes    Is it Hard or Difficult finding clothes that fit Yes      Lymphedema Stage   Stage STAGE 2 SPONTANEOUSLY IRREVERSIBLE      Left Upper Extremity Lymphedema   15 cm Proximal to Olecranon Process 40 cm    10 cm Proximal to Olecranon Process 38.5 cm    Olecranon Process 29 cm    15 cm Proximal to Ulnar Styloid Process 30.3 cm    10 cm Proximal to Ulnar Styloid Process 27 cm    Just Proximal to Ulnar Styloid Process 22.5 cm    Across Hand at [facility] 20.7 cm    At [place] of 2nd Digit 6.6 cm                     Objective measurements completed on examination: See above findings.       [facility] Adult PT Treatment/Exercise - 05/15/21 0001       Self-Care   Other Self-Care Comments  reviewed treatment options: including bandaging and MLD in clinic for a few weeks, having husband continue to bandage but to continue more frequently to see if the sleeve gets less tight.  Or continuing to wear the day sleeve and  night as they do fit just hard to get on.  Due to amount of appointments pt would like to have husband bandage (gave 2 finger rolls) and/or keep using her garments.  Pt was educated that if the garments do not fit correctly she should let me know ASAP.  Also discussed  the pump and pt would still like to pursue this.  Sent message to oncologist to get her okay.                               Plan - 05/15/21 1504     Clinical Impression Statement Pt returns to PT more due to new MD referral for continued lymphedema management.  Pt overall is doing well with her garments and bandaging but the size of the back of the hand and the forearm have increased since DC visit here.  The forearm continues to be fibrotic and pitting with risk for skin breakdown. Due to appointment burden currently pt will continue with home management but would like to continue pursuing the pump.  Pt would benefit from a flexitouch to decrease fibrotic skin changes, decrease arm size, and allow pt to continue with lymphedema maintenance.  She has been in therapy x more than 3 months.    Personal Factors and Comorbidities Age;Comorbidity 3+    Comorbidities multiple surgeries for cancer, lymphedema in left arm, arthritis, DM, increased age with decreased fitness    Examination-Activity Limitations Reach Overhead;Caring for Others;Carry    Stability/Clinical Decision Making Stable/Uncomplicated    Clinical Decision Making Low    Rehab Potential Fair    PT Frequency One time visit    PT Treatment/Interventions ADLs/Self Care Home Management;Functional mobility training;Therapeutic activities;Therapeutic exercise;Neuromuscular re-education;Patient/family education;Orthotic Fit/Training;Manual techniques;Manual lymph drainage;Compression bandaging;Scar mobilization;Passive range of motion;Taping    PT Next Visit Plan recheck as needed    Consulted and Agree with Plan of Care Patient             Patient will  benefit from skilled therapeutic intervention in order to improve the following deficits and impairments:  Abnormal gait, Decreased range of motion, Decreased strength, Decreased endurance, Decreased balance, Decreased scar mobility, Postural dysfunction, Increased fascial restricitons, Impaired perceived functional ability, Impaired UE functional use, Impaired flexibility, Increased edema, Obesity  Visit Diagnosis: Postmastectomy lymphedema     Problem List Patient Active Problem List   Diagnosis Date Noted   Microcytosis 04/29/2021   Hypokalemia 04/28/2021   SBO (small bowel obstruction) (HCC) 04/27/2021   Pulmonary embolism (HCC)    Agnosia 08/09/2020   Hardening of the aorta (main artery of the heart) (HCC) 08/09/2020   Morbid obesity (HCC) 08/09/2020   Neuropathy 08/09/2020   Primary osteoarthritis 08/09/2020   Sensorineural hearing loss (SNHL) of both ears 08/07/2020   Anosmia 06/05/2020   Tinnitus, bilateral 06/05/2020   GI bleed 07/13/2019   Acute blood loss anemia 07/12/2019   Microcytic anemia 01/05/2018   Melena 01/04/2018   Lower GI bleed 01/04/2018   Type 2 diabetes mellitus without complication (HCC) 12/05/2017   Bacteremia due to methicillin susceptible Staphylococcus aureus (MSSA) 12/02/2017   Tenosynovitis of left wrist 12/02/2017   Infection of left wrist (HCC) 11/30/2017   Primary osteoarthritis of right hip 01/05/2017   Osteoarthritis of right hip 01/03/2017   Lymphedema of upper extremity 01/17/2014   Osteopenia 01/17/2014   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   Dilated pore of [place] 08/02/2013   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 08/02/2013   Scar 08/02/2013   Malignant neoplasm of lower-inner quadrant of left breast in female, estrogen receptor positive (HCC) 06/16/2013   Type II or unspecified type diabetes mellitus without mention of complication, not stated as uncontrolled 03/25/2013   Essential hypertension  03/25/2013   Chest wall  recurrence of breast cancer (HCC) 02/21/2013   Abdominal wall mass 01/14/2013    Idamae Lusher, PT 05/15/2021, 3:08 PM  [facility] @ [facility] [address] [place], [place], [address] Phone: [phone]   Fax:  [phone]  Name: Tami Schmidt MRN: [id number] Date of Birth: Mar 11, 1933  PHYSICAL THERAPY DISCHARGE SUMMARY  Visits from Start of Care: 1  Current functional level related to goals / functional outcomes: See above    Plan: Patient agrees to discharge.  Patient is being discharged due to meeting the stated rehab goals.

## 2021-05-22 ENCOUNTER — Inpatient Hospital Stay: Payer: Medicare Other | Attending: Adult Health

## 2021-05-22 ENCOUNTER — Other Ambulatory Visit: Payer: Self-pay

## 2021-05-22 ENCOUNTER — Inpatient Hospital Stay: Payer: Medicare Other

## 2021-05-22 VITALS — BP 167/68 | HR 75 | Temp 98.0°F | Resp 18

## 2021-05-22 DIAGNOSIS — I2699 Other pulmonary embolism without acute cor pulmonale: Secondary | ICD-10-CM | POA: Diagnosis not present

## 2021-05-22 DIAGNOSIS — Z5112 Encounter for antineoplastic immunotherapy: Secondary | ICD-10-CM | POA: Insufficient documentation

## 2021-05-22 DIAGNOSIS — Z5111 Encounter for antineoplastic chemotherapy: Secondary | ICD-10-CM | POA: Insufficient documentation

## 2021-05-22 DIAGNOSIS — Z170 Estrogen receptor positive status [ER+]: Secondary | ICD-10-CM | POA: Diagnosis not present

## 2021-05-22 DIAGNOSIS — C7951 Secondary malignant neoplasm of bone: Secondary | ICD-10-CM | POA: Diagnosis not present

## 2021-05-22 DIAGNOSIS — M8580 Other specified disorders of bone density and structure, unspecified site: Secondary | ICD-10-CM | POA: Diagnosis not present

## 2021-05-22 DIAGNOSIS — C50912 Malignant neoplasm of unspecified site of left female breast: Secondary | ICD-10-CM | POA: Insufficient documentation

## 2021-05-22 DIAGNOSIS — C773 Secondary and unspecified malignant neoplasm of axilla and upper limb lymph nodes: Secondary | ICD-10-CM | POA: Diagnosis not present

## 2021-05-22 DIAGNOSIS — C50312 Malignant neoplasm of lower-inner quadrant of left female breast: Secondary | ICD-10-CM

## 2021-05-22 MED ORDER — FULVESTRANT 250 MG/5ML IM SOSY
500.0000 mg | PREFILLED_SYRINGE | Freq: Once | INTRAMUSCULAR | Status: AC
  Administered 2021-05-22: 500 mg via INTRAMUSCULAR

## 2021-05-23 ENCOUNTER — Telehealth: Payer: Self-pay | Admitting: Hematology and Oncology

## 2021-05-23 NOTE — Telephone Encounter (Signed)
Rescheduled appointment per providers template. Patient aware.  ?

## 2021-05-24 ENCOUNTER — Other Ambulatory Visit: Payer: Self-pay | Admitting: Hematology and Oncology

## 2021-05-24 NOTE — Progress Notes (Addendum)
Pharmacist Chemotherapy Monitoring - Initial Assessment    Anticipated start date: 05/30/21   The following has been reviewed per standard work regarding the patient's treatment regimen: The patient's diagnosis, treatment plan and drug doses, and organ/hematologic function Lab orders and baseline tests specific to treatment regimen  The treatment plan start date, drug sequencing, and pre-medications Prior authorization status  Patient's documented medication list, including drug-drug interaction screen and prescriptions for anti-emetics and supportive care specific to the treatment regimen The drug concentrations, fluid compatibility, administration routes, and timing of the medications to be used The patient's access for treatment and lifetime cumulative dose history, if applicable  The patient's medication allergies and previous infusion related reactions, if applicable   Changes made to treatment plan:  N/A  Follow up needed:  Need additional [employer] orders. Due again 06/19/21.   Raul Del Page, [place], BCPS, BCOP 05/24/2021  4:28 PM

## 2021-05-27 ENCOUNTER — Ambulatory Visit (INDEPENDENT_AMBULATORY_CARE_PROVIDER_SITE_OTHER): Payer: Medicare Other | Admitting: Podiatry

## 2021-05-27 ENCOUNTER — Encounter: Payer: Self-pay | Admitting: Podiatry

## 2021-05-27 ENCOUNTER — Other Ambulatory Visit: Payer: Self-pay

## 2021-05-27 DIAGNOSIS — B351 Tinea unguium: Secondary | ICD-10-CM | POA: Diagnosis not present

## 2021-05-27 DIAGNOSIS — M79674 Pain in right toe(s): Secondary | ICD-10-CM

## 2021-05-27 DIAGNOSIS — L84 Corns and callosities: Secondary | ICD-10-CM | POA: Diagnosis not present

## 2021-05-27 DIAGNOSIS — M2141 Flat foot [pes planus] (acquired), right foot: Secondary | ICD-10-CM

## 2021-05-27 DIAGNOSIS — E1142 Type 2 diabetes mellitus with diabetic polyneuropathy: Secondary | ICD-10-CM | POA: Diagnosis not present

## 2021-05-27 DIAGNOSIS — M2142 Flat foot [pes planus] (acquired), left foot: Secondary | ICD-10-CM

## 2021-05-27 DIAGNOSIS — I739 Peripheral vascular disease, unspecified: Secondary | ICD-10-CM

## 2021-05-27 DIAGNOSIS — M79675 Pain in left toe(s): Secondary | ICD-10-CM | POA: Diagnosis not present

## 2021-05-27 DIAGNOSIS — E119 Type 2 diabetes mellitus without complications: Secondary | ICD-10-CM

## 2021-05-30 ENCOUNTER — Other Ambulatory Visit: Payer: Self-pay

## 2021-05-30 ENCOUNTER — Telehealth: Payer: Self-pay | Admitting: Genetic Counselor

## 2021-05-30 ENCOUNTER — Encounter: Payer: Medicare Other | Admitting: Genetic Counselor

## 2021-05-30 ENCOUNTER — Other Ambulatory Visit: Payer: Medicare Other

## 2021-05-30 ENCOUNTER — Telehealth: Payer: Self-pay

## 2021-05-30 ENCOUNTER — Inpatient Hospital Stay: Payer: Medicare Other

## 2021-05-30 ENCOUNTER — Inpatient Hospital Stay: Payer: Medicare Other | Admitting: Genetic Counselor

## 2021-05-30 ENCOUNTER — Inpatient Hospital Stay: Payer: Medicare Other | Admitting: Adult Health

## 2021-05-30 VITALS — BP 158/80 | HR 83 | Temp 97.7°F | Resp 18 | Ht 67.0 in | Wt 209.3 lb

## 2021-05-30 DIAGNOSIS — Z170 Estrogen receptor positive status [ER+]: Secondary | ICD-10-CM

## 2021-05-30 DIAGNOSIS — C7989 Secondary malignant neoplasm of other specified sites: Secondary | ICD-10-CM | POA: Diagnosis not present

## 2021-05-30 DIAGNOSIS — C50312 Malignant neoplasm of lower-inner quadrant of left female breast: Secondary | ICD-10-CM | POA: Diagnosis not present

## 2021-05-30 DIAGNOSIS — Z5112 Encounter for antineoplastic immunotherapy: Secondary | ICD-10-CM | POA: Diagnosis not present

## 2021-05-30 DIAGNOSIS — C50919 Malignant neoplasm of unspecified site of unspecified female breast: Secondary | ICD-10-CM

## 2021-05-30 DIAGNOSIS — C50912 Malignant neoplasm of unspecified site of left female breast: Secondary | ICD-10-CM

## 2021-05-30 LAB — CBC WITH DIFFERENTIAL (CANCER CENTER ONLY)
Abs Immature Granulocytes: 0.01 10*3/uL (ref 0.00–0.07)
Basophils Absolute: 0.1 10*3/uL (ref 0.0–0.1)
Basophils Relative: 1 %
Eosinophils Absolute: 0.4 10*3/uL (ref 0.0–0.5)
Eosinophils Relative: 6 %
HCT: 39.7 % (ref 36.0–46.0)
Hemoglobin: 12.4 g/dL (ref 12.0–15.0)
Immature Granulocytes: 0 %
Lymphocytes Relative: 32 %
Lymphs Abs: 1.8 10*3/uL (ref 0.7–4.0)
MCH: 24.8 pg — ABNORMAL LOW (ref 26.0–34.0)
MCHC: 31.2 g/dL (ref 30.0–36.0)
MCV: 79.6 fL — ABNORMAL LOW (ref 80.0–100.0)
Monocytes Absolute: 0.4 10*3/uL (ref 0.1–1.0)
Monocytes Relative: 8 %
Neutro Abs: 3 10*3/uL (ref 1.7–7.7)
Neutrophils Relative %: 53 %
Platelet Count: 237 10*3/uL (ref 150–400)
RBC: 4.99 MIL/uL (ref 3.87–5.11)
RDW: 16.3 % — ABNORMAL HIGH (ref 11.5–15.5)
WBC Count: 5.6 10*3/uL (ref 4.0–10.5)
nRBC: 0 % (ref 0.0–0.2)

## 2021-05-30 LAB — CMP (CANCER CENTER ONLY)
ALT: 7 U/L (ref 0–44)
AST: 9 U/L — ABNORMAL LOW (ref 15–41)
Albumin: 4 g/dL (ref 3.5–5.0)
Alkaline Phosphatase: 54 U/L (ref 38–126)
Anion gap: 8 (ref 5–15)
BUN: 18 mg/dL (ref 8–23)
CO2: 26 mmol/L (ref 22–32)
Calcium: 8.9 mg/dL (ref 8.9–10.3)
Chloride: 105 mmol/L (ref 98–111)
Creatinine: 0.66 mg/dL (ref 0.44–1.00)
GFR, Estimated: >60 mL/min (ref >60)
Glucose, Bld: 173 mg/dL — ABNORMAL HIGH (ref 70–99)
Potassium: 3.9 mmol/L (ref 3.5–5.1)
Sodium: 139 mmol/L (ref 135–145)
Total Bilirubin: 0.5 mg/dL (ref 0.3–1.2)
Total Protein: 6.8 g/dL (ref 6.5–8.1)

## 2021-05-30 MED ORDER — ACETAMINOPHEN 325 MG PO TABS
650.0000 mg | ORAL_TABLET | Freq: Once | ORAL | Status: AC
  Administered 2021-05-30: 650 mg via ORAL
  Filled 2021-05-30: qty 2

## 2021-05-30 MED ORDER — TRASTUZUMAB-ANNS CHEMO 150 MG IV SOLR
8.0000 mg/kg | Freq: Once | INTRAVENOUS | Status: AC
  Administered 2021-05-30: 777 mg via INTRAVENOUS
  Filled 2021-05-30: qty 37

## 2021-05-30 MED ORDER — SODIUM CHLORIDE 0.9 % IV SOLN: Freq: Once | INTRAVENOUS | Status: AC

## 2021-05-30 MED ORDER — DIPHENHYDRAMINE HCL 25 MG PO CAPS
50.0000 mg | ORAL_CAPSULE | Freq: Once | ORAL | Status: AC
  Administered 2021-05-30: 50 mg via ORAL
  Filled 2021-05-30: qty 2

## 2021-05-30 NOTE — Patient Instructions (Signed)
[facility]  Discharge Instructions: Thank you for choosing [facility] to provide your oncology and hematology care.   If you have a lab appointment with the [facility], please go directly to the [facility] and check in at the registration area.   Wear comfortable clothing and clothing appropriate for easy access to any Portacath or PICC line.   We strive to give you quality time with your provider. You may need to reschedule your appointment if you arrive late (15 or more minutes).  Arriving late affects you and other patients whose appointments are after yours.  Also, if you miss three or more appointments without notifying the office, you may be dismissed from the clinic at the providers discretion.      For prescription refill requests, have your pharmacy contact our office and allow 72 hours for refills to be completed.    Today you received the following chemotherapy and/or immunotherapy agent: Herceptin      To help prevent nausea and vomiting after your treatment, we encourage you to take your nausea medication as directed.  BELOW ARE SYMPTOMS THAT SHOULD BE REPORTED IMMEDIATELY: *FEVER GREATER THAN 100.4 F (38 C) OR HIGHER *CHILLS OR SWEATING *NAUSEA AND VOMITING THAT IS NOT CONTROLLED WITH YOUR NAUSEA MEDICATION *UNUSUAL SHORTNESS OF BREATH *UNUSUAL BRUISING OR BLEEDING *URINARY PROBLEMS (pain or burning when urinating, or frequent urination) *BOWEL PROBLEMS (unusual diarrhea, constipation, pain near the anus) TENDERNESS IN MOUTH AND THROAT WITH OR WITHOUT PRESENCE OF ULCERS (sore throat, sores in mouth, or a toothache) UNUSUAL RASH, SWELLING OR PAIN  UNUSUAL VAGINAL DISCHARGE OR ITCHING   Items with * indicate a potential emergency and should be followed up as soon as possible or go to the Emergency Department if any problems should occur.  Please show the CHEMOTHERAPY ALERT CARD or IMMUNOTHERAPY ALERT CARD at check-in to  the Emergency Department and triage nurse.  Should you have questions after your visit or need to cancel or reschedule your appointment, please contact [facility]  Dept: [phone]  and follow the prompts.  Office hours are 8:00 a.m. to 4:30 p.m. Monday - Friday. Please note that voicemails left after 4:00 p.m. may not be returned until the following business day.  We are closed weekends and major holidays. You have access to a nurse at all times for urgent questions. Please call the main number to the clinic Dept: [phone] and follow the prompts.   For any non-urgent questions, you may also contact your provider using MyChart. We now offer e-Visits for anyone 86 and older to request care online for non-urgent symptoms. For details visit [email or web address].   Also download the MyChart app! Go to the app store, search "MyChart", open the app, select [facility], and log in with your MyChart username and password.  Due to Covid, a mask is required upon entering the hospital/clinic. If you do not have a mask, one will be given to you upon arrival. For doctor visits, patients may have 1 support person aged 86 or older or older with them. For treatment visits, patients cannot have anyone with them due to current Covid guidelines and our immunocompromised population.

## 2021-05-30 NOTE — Assessment & Plan Note (Signed)
Tami Schmidt is here today for her newly recurrent estrogen positive, HER2 positive breast cancer.  1.  Recurrent breast cancer: She continues on fulvestrant every 4 weeks and Herceptin will start today.  This will be given every 3 weeks.  I reviewed her echocardiogram.  We discussed her treatment with her in detail, how it works, and her echo results.  I explained to her the difference between targeted therapy versus chemotherapy and reasoning behind her current treatment.  I let her know that we would be repeating imaging in a few months to see how treatment is doing and that we are hopeful that cancer will improve with treatment so she can continue on it, however we will need the imaging to review and compare to her previous CT scan to evaluate for progression.  #2 lymphedema: I sent a message to physical therapy and they have been working on getting her a pump.  My nurse will call her with this information.  She will continue to follow with physical therapy and follow their recommendations.  I am hopeful with treatment her arm will improve since her lymphedema is more obstructive in nature.  We will see Tami Schmidt back in 3 weeks for labs, follow-up, and her next Herceptin.

## 2021-05-30 NOTE — Telephone Encounter (Signed)
-----   Message from Gardenia Phlegm, NP sent at 05/30/2021 10:07 AM EST ----- Please let patient know  ----- Message ----- From: Stark Bray, PT Sent: 05/30/2021  10:03 AM EST To: Gardenia Phlegm, NP  She is already set up with [employer] for getting a pump.  They are just awaiting approval I think.   She had been okayed by Dr. Chryl Heck.  Marcene Brawn   ----- Message ----- From: Gardenia Phlegm, NP Sent: 05/30/2021   9:59 AM EST To: Stark Bray, PT  She has a question about getting a lymphedema pump.  Can you please weigh in?  Thanks, [place]

## 2021-05-30 NOTE — Progress Notes (Signed)
[facility] Follow up:    Tami Schmidt, [place] [address]   DIAGNOSIS: Recurrent breast cancer  SUMMARY OF ONCOLOGIC HISTORY: 88 y.o. [place] woman   (1) status post right lumpectomy and axillary dissection in 1993 in [place] for what appears to have been a stage I breast cancer, treated with radiation and then tamoxifen for 5 years   (2) status post right mastectomy 2002 for a recurrence in the right breast, treated adjuvantly with tamoxifen for an additional 5 years   (3) status post left mastectomy and sentinel lymph node sampling 03/07/2003 for a lower inner quadrant pT1b, pN0, stage IA mucinous breast cancer, grade 2; no radiation therapy was given. Tamoxifen was continued until 2009   RECURRENT DISEASE/ LEFT: OCTOBER 2014 (4) excisional biopsy of a left chest wall mass 01/26/2013 showing carcinoma consistent with invasive ductal carcinoma, estrogen receptor 100% positive, progesterone receptor 0% positive, with an MIB-1 of 22% and no HER-2 amplification.    (5)  PET scan on 03/03/2013 confirmed extensive metastatic adenopathy in the left axilla, left subpectoral musculature, and left supraclavicular region   (6)  started on anastrozole daily beginning 03/01/2013, interrupted during radiation therapy, resumed March 2015, discontinued March 2016 because of fatigue and arthralgias   (7) radiation therapy 03/31/2013 through 05/23/2013 Site/dose:   The patient was treated initially with a forward treatment planning technique to the left chest wall in addition to treatment to the supraclavicular region. This consisted of a 3-D conformal technique. The patient was treated in this fashion to a dose of 50.4 gray. The patient then received a 14 gray boost treatment using an electron field. The total dose was 64.4 gray.   (8) Osteopenia, zometa yearly started 07/27/2014, but poorly tolerated.              (a) bone density scan on  12/27/13 showed a t-score of -1.8              (b) started Denosumab/Prolia April 2017, canceled after one dose per patient   (9) tamoxifen started 07/27/2014, stopped November 2019 (had total 5 years of antiestrogens)   (10) likely thalassemia trait   (11) left subpectoral soft tissue nodule noted 07/17/2015 unchanged through serial scans, most recent 41/74/0814, with no metabolic activity noted in PET scan of 2016 and interval calcification.  This is presumed benign   (12)  ADDITIONAL REGIONAL RECURRENCE:             (A) CT of the chest with contrast 03/05/2021 obtained to evaluate left upper extremity lymphedema as showed acute pulmonary emboli.  There was also left axillary and subpectoral lymphadenopathy             (B) rivaroxaban started 03/05/2021             (C) left breast ultrasound 03/29/2021 shows 2 areas in the infraclavicular tissue consistent with fat necrosis.  There were however multiple retropectoral interpectoral and left axillary masses.             (D) left axillary lymph node biopsy 04/04/2021: IDC grade 3, ER positive, 100%, strong staining intensity, PR positive, 20%, weak staining intensity, Ki-67 of 15%, HER2 positive by IHC, ratio 2.90  (E) Fulvestrant every 4 weeks beginning 04/18/2021  (F) Herceptin every 3 weeks beginning 05/30/2021    CURRENT THERAPY: Trastuzumab, Fulvestrant.    INTERVAL HISTORY: Tami Schmidt 88 y.o. female returns for evaluation prior to receiving Herceptin therapy.  Her most  recent echocardiogram was completed on May 06, 2021.  This showed a normal ejection fraction of 60 to 65%.  Global longitudinal strain was normal.  This is Supriya's first Herceptin treatment today.  She has a few questions.  She continues to receive fulvestrant every 4 weeks and is tolerating this well.  She wants to know how long she is going to be doing this current therapy.  She denies any new issues and says that she is feeling well otherwise.  She has been  seeing physical therapy and does want to know about the pump for her lymphedema and whether or not she can have it.   Patient Active Problem List   Diagnosis Date Noted   Microcytosis 04/29/2021   Hypokalemia 04/28/2021   SBO (small bowel obstruction) ([facility]) 04/27/2021   Pulmonary embolism ([facility])    Agnosia 08/09/2020   Hardening of the aorta (main artery of the heart) ([facility]) 08/09/2020   Morbid obesity ([facility]) 08/09/2020   Neuropathy 08/09/2020   Primary osteoarthritis 08/09/2020   Sensorineural hearing loss (SNHL) of both ears 08/07/2020   Anosmia 06/05/2020   Tinnitus, bilateral 06/05/2020   GI bleed 07/13/2019   Acute blood loss anemia 07/12/2019   Microcytic anemia 01/05/2018   Melena 01/04/2018   Lower GI bleed 01/04/2018   Type 2 diabetes mellitus without complication ([facility]) 25/08/3974   Bacteremia due to methicillin susceptible Staphylococcus aureus (MSSA) 12/02/2017   Tenosynovitis of left wrist 12/02/2017   Infection of left wrist ([facility]) 11/30/2017   Primary osteoarthritis of right hip 01/05/2017   Osteoarthritis of right hip 01/03/2017   Lymphedema of upper extremity 01/17/2014   Osteopenia 01/17/2014   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   Dilated pore of [place] 08/02/2013   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  MEDICAL HISTORY: Past Medical History:  Diagnosis Date   Arthritis    Breast cancer ([facility])    b/l mastectomies hx   Cancer ([facility])    breast   Carcinoma  metastatic to lymph node ([facility]) 03/25/2013   Diabetes mellitus    fasting 90-100   HTN (hypertension) 03/25/2013   Hx of radiation therapy    breasts hx   Hypercholesterolemia    Hypertension    Lymphedema of arm    left arm   Pulmonary embolism ([facility])    Type II or unspecified type diabetes mellitus without mention of complication, not stated as uncontrolled 03/25/2013    SURGICAL HISTORY: Past Surgical History:  Procedure Laterality Date   ABDOMINAL HYSTERECTOMY     ANKLE SURGERY Right 1995   APPENDECTOMY     BIOPSY  01/07/2018   Procedure: BIOPSY;  Surgeon: [name], MD;  Location: [facility];  Service: Gastroenterology;;   BREAST SURGERY Bilateral    mastectomy   COLONOSCOPY WITH PROPOFOL N/A 01/07/2018   Procedure: COLONOSCOPY WITH PROPOFOL;  Surgeon: [name], MD;  Location: [facility];  Service: Gastroenterology;  Laterality: N/A;   ESOPHAGOGASTRODUODENOSCOPY (EGD) WITH PROPOFOL N/A 01/06/2018   Procedure: ESOPHAGOGASTRODUODENOSCOPY (EGD) WITH PROPOFOL;  Surgeon: [name], MD;  Location: [facility];  Service: Gastroenterology;  Laterality: N/A;   HERNIA REPAIR  04-26-2010   MASS EXCISION Left 01/26/2013   Procedure: EXCISION LEFT CHEST WALL MASS AND LEFT ABDOMNAL WALL MASS;  Surgeon: Harl Bowie, MD;  Location: [facility];  Service: General;  Laterality: Left;   MASS EXCISION Left 09/20/2014   Procedure: EXCISION OF LEFT CHEST WALL MASS;  Surgeon: Coralie Keens, MD;  Location: [facility];  Service: General;  Laterality: Left;   TEE WITHOUT CARDIOVERSION N/A 12/08/2017   Procedure: TRANSESOPHAGEAL ECHOCARDIOGRAM (TEE);  Surgeon: Lelon Perla, MD;  Location: [facility];  Service: Cardiovascular;  Laterality: N/A;   TOTAL HIP ARTHROPLASTY Right 01/05/2017   TOTAL HIP ARTHROPLASTY Right 01/05/2017   Procedure: TOTAL HIP ARTHROPLASTY ANTERIOR APPROACH;  Surgeon: Frederik Pear, MD;  Location: [facility];  Service: Orthopedics;  Laterality: Right;     SOCIAL HISTORY: Social History   Socioeconomic History   Marital status: Married    Spouse name: Not on file   Number of children: Not on file   Years of education: Not on file   Highest education level: Not on file  Occupational History   Occupation: retired Education officer, museum  Tobacco Use   Smoking status: Former    Types: Cigarettes    Quit date: 04/14/1972    Years since quitting: 49.1   Smokeless tobacco: Never  Vaping Use   Vaping Use: Never used  Substance and Sexual Activity   Alcohol use: Yes    Comment: occasional   Drug use: Yes   Sexual activity: Yes    Birth control/protection: Surgical  Other Topics Concern   Not on file  Social History Narrative   Not on file   Social Determinants of Health   Financial Resource Strain: Not on file  Food Insecurity: Not on file  Transportation Needs: Not on file  Physical Activity: Not on file  Stress: Not on file  Social Connections: Not on file  Intimate Partner Violence: Not on file    FAMILY HISTORY: Noncontributory  Review of Systems  Constitutional:  Negative for appetite change, chills, fatigue, fever and unexpected weight change.  HENT:   Negative for hearing loss, lump/mass and trouble swallowing.   Eyes:  Negative for eye problems and icterus.  Respiratory:  Negative for chest tightness, cough and shortness of breath.   Cardiovascular:  Negative for chest pain, leg swelling and palpitations.  Gastrointestinal:  Negative for abdominal distention, abdominal pain, constipation, diarrhea, nausea and vomiting.  Endocrine: Negative for hot flashes.  Genitourinary:  Negative for difficulty urinating.   Musculoskeletal:  Negative for arthralgias.  Skin:  Negative for itching and rash.  Neurological:  Negative for dizziness, extremity weakness, headaches and numbness.  Hematological:  Negative for adenopathy. Does not bruise/bleed easily.  Psychiatric/Behavioral:  Negative for depression. The patient is not  nervous/anxious.      PHYSICAL EXAMINATION  ECOG PERFORMANCE STATUS: 1 - Symptomatic but completely ambulatory  Vitals:   05/30/21 0920  BP: (!) 158/80  Pulse: 83  Resp: 18  Temp: 97.7 F (36.5 C)  SpO2: 100%    Physical Exam Constitutional:      General: She is not in acute distress.    Appearance: Normal appearance. She is not toxic-appearing.  HENT:     Head: Normocephalic and atraumatic.  Eyes:     General: No scleral icterus. Cardiovascular:     Rate and Rhythm: Normal rate and regular rhythm.     Pulses: Normal pulses.  Heart sounds: Normal heart sounds.  Pulmonary:     Effort: Pulmonary effort is normal.     Breath sounds: Normal breath sounds.  Abdominal:     General: Abdomen is flat. Bowel sounds are normal. There is no distension.     Palpations: Abdomen is soft.     Tenderness: There is no abdominal tenderness.  Musculoskeletal:        General: No swelling.     Cervical back: Neck supple.  Lymphadenopathy:     Cervical: No cervical adenopathy.  Skin:    General: Skin is warm and dry.     Findings: No rash.  Neurological:     General: No focal deficit present.     Mental Status: She is alert.  Psychiatric:        Mood and Affect: Mood normal.        Behavior: Behavior normal.    LABORATORY DATA:  CBC    Component Value Date/Time   WBC 5.6 05/30/2021 0851   WBC 6.5 04/30/2021 0334   RBC 4.99 05/30/2021 0851   HGB 12.4 05/30/2021 0851   HGB 10.5 (L) 01/20/2017 1057   HCT 39.7 05/30/2021 0851   HCT 25.8 (L) 01/05/2018 0513   HCT 32.8 (L) 01/20/2017 1057   PLT 237 05/30/2021 0851   PLT 368 01/20/2017 1057   MCV 79.6 (L) 05/30/2021 0851   MCV 77.8 (L) 01/20/2017 1057   MCH 24.8 (L) 05/30/2021 0851   MCHC 31.2 05/30/2021 0851   RDW 16.3 (H) 05/30/2021 0851   RDW 16.0 (H) 01/20/2017 1057   LYMPHSABS 1.8 05/30/2021 0851   LYMPHSABS 1.3 01/20/2017 1057   MONOABS 0.4 05/30/2021 0851   MONOABS 0.4 01/20/2017 1057   EOSABS 0.4 05/30/2021  0851   EOSABS 0.1 01/20/2017 1057   BASOSABS 0.1 05/30/2021 0851   BASOSABS 0.1 01/20/2017 1057    CMP     Component Value Date/Time   NA 139 05/30/2021 0851   NA 139 01/20/2017 1057   K 3.9 05/30/2021 0851   K 3.9 01/20/2017 1057   CL 105 05/30/2021 0851   CO2 26 05/30/2021 0851   CO2 25 01/20/2017 1057   GLUCOSE 173 (H) 05/30/2021 0851   GLUCOSE 135 01/20/2017 1057   BUN 18 05/30/2021 0851   BUN 15.6 01/20/2017 1057   CREATININE 0.66 05/30/2021 0851   CREATININE 0.7 01/20/2017 1057   CALCIUM 8.9 05/30/2021 0851   CALCIUM 9.3 01/20/2017 1057   PROT 6.8 05/30/2021 0851   PROT 7.1 01/20/2017 1057   ALBUMIN 4.0 05/30/2021 0851   ALBUMIN 3.4 (L) 01/20/2017 1057   AST 9 (L) 05/30/2021 0851   AST 10 01/20/2017 1057   ALT 7 05/30/2021 0851   ALT 14 01/20/2017 1057   ALKPHOS 54 05/30/2021 0851   ALKPHOS 69 01/20/2017 1057   BILITOT 0.5 05/30/2021 0851   BILITOT 0.43 01/20/2017 1057   GFRNONAA >60 05/30/2021 0851   GFRAA >60 07/14/2019 0538       ASSESSMENT and THERAPY PLAN:   Chest wall recurrence of breast cancer ([place]) Bertrice is here today for her newly recurrent estrogen positive, HER2 positive breast cancer.  1.  Recurrent breast cancer: She continues on fulvestrant every 4 weeks and Herceptin will start today.  This will be given every 3 weeks.  I reviewed her echocardiogram.  We discussed her treatment with her in detail, how it works, and her echo results.  I explained to her the difference between targeted therapy versus chemotherapy and  reasoning behind her current treatment.  I let her know that we would be repeating imaging in a few months to see how treatment is doing and that we are hopeful that cancer will improve with treatment so she can continue on it, however we will need the imaging to review and compare to her previous CT scan to evaluate for progression.  #2 lymphedema: I sent a message to physical therapy and they have been working on getting her a  pump.  My nurse will call her with this information.  She will continue to follow with physical therapy and follow their recommendations.  I am hopeful with treatment her arm will improve since her lymphedema is more obstructive in nature.  We will see Tamaya back in 3 weeks for labs, follow-up, and her next Herceptin.   All questions were answered. The patient knows to call the clinic with any problems, questions or concerns. We can certainly see the patient much sooner if necessary.  Total encounter time: 30 minutes in face-to-face visit time, chart review, lab review, care coordination, order entry, and documentation of the encounter.  Wilber Bihari, NP 05/30/21 10:57 AM Medical Oncology and Hematology [facility] [place], [place] [address] Tel. [phone]    Fax. [phone]  *Total Encounter Time as defined by the Centers for Medicare and Medicaid Services includes, in addition to the face-to-face time of a patient visit (documented in the note above) non-face-to-face time: obtaining and reviewing outside history, ordering and reviewing medications, tests or procedures, care coordination (communications with other health care professionals or caregivers) and documentation in the medical record.

## 2021-05-30 NOTE — Telephone Encounter (Signed)
Told Ms. Gaunce that a scheduler will call to reschedule her genetic counseling appt.

## 2021-05-30 NOTE — Telephone Encounter (Signed)
Attempted to call pt to give below message. LVM for call back.

## 2021-05-31 ENCOUNTER — Telehealth: Payer: Self-pay | Admitting: *Deleted

## 2021-05-31 LAB — CANCER ANTIGEN 27.29: CA 27.29: 41.5 U/mL — ABNORMAL HIGH (ref 0.0–38.6)

## 2021-05-31 NOTE — Telephone Encounter (Signed)
Called pt. to follow up with first time treatment/Herceptin. Pt. states she had the chills, but that went away quickly without any further measures, denies fever or any other symptoms. Instructed to call with any issues or concerns, pt. expressed understanding.

## 2021-06-02 NOTE — Progress Notes (Signed)
ANNUAL DIABETIC FOOT EXAM  Subjective: [place] presents today for for annual diabetic foot examination.  Patient relates 20+ year h/o diabetes.  Patient denies any h/o foot wounds.  Patient admits symptoms of numbness on plantar aspect of both feet.  Patient admits symptoms of foot tingling.  Patient's blood sugar was 100 mg/dl today.   Risk factors: diabetes, HTN.  [name], MD is patient's PCP. Last visit was October, 2022.  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   Diabetes mellitus    fasting 90-100   HTN (hypertension) 03/25/2013   Hx of radiation therapy    breasts hx   Hypercholesterolemia    Hypertension    Lymphedema of arm    left arm   Pulmonary embolism ([place])    Type II or unspecified type diabetes mellitus without mention of complication, not stated as uncontrolled 03/25/2013   Patient Active Problem List   Diagnosis Date Noted   Microcytosis 04/29/2021   Hypokalemia 04/28/2021   SBO (small bowel obstruction) ([place]) 04/27/2021   Pulmonary embolism ([place])    Agnosia 08/09/2020   Hardening of the aorta (main artery of the heart) ([place]) 08/09/2020   Morbid obesity ([place]) 08/09/2020   Neuropathy 08/09/2020   Primary osteoarthritis 08/09/2020   Sensorineural hearing loss (SNHL) of both ears 08/07/2020   Anosmia 06/05/2020   Tinnitus, bilateral 06/05/2020   GI bleed 07/13/2019   Acute blood loss anemia 07/12/2019   Microcytic anemia 01/05/2018   Melena 01/04/2018   Lower GI bleed 01/04/2018   Type 2 diabetes mellitus without complication ([place]) 44/31/5400   Bacteremia due to methicillin susceptible Staphylococcus aureus (MSSA) 12/02/2017   Tenosynovitis of left wrist 12/02/2017   Infection of left wrist ([place]) 11/30/2017   Primary osteoarthritis of right hip 01/05/2017   Osteoarthritis of right hip  01/03/2017   Lymphedema of upper extremity 01/17/2014   Osteopenia 01/17/2014   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   Dilated pore of [place] 08/02/2013   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 08/02/2013   Scar 08/02/2013   Malignant neoplasm of lower-inner quadrant of left breast in female, estrogen receptor positive ([place]) 06/16/2013   Type II or unspecified type diabetes mellitus without mention of complication, not stated as uncontrolled 03/25/2013   Essential hypertension 03/25/2013   Chest wall recurrence of breast cancer ([place]) 02/21/2013   Abdominal wall mass 01/14/2013   Past Surgical History:  Procedure Laterality Date   ABDOMINAL HYSTERECTOMY     ANKLE SURGERY Right 1995   APPENDECTOMY     BIOPSY  01/07/2018   Procedure: BIOPSY;  Surgeon: [name], MD;  Location: [facility];  Service: Gastroenterology;;   BREAST SURGERY Bilateral    mastectomy   COLONOSCOPY WITH PROPOFOL N/A 01/07/2018   Procedure: COLONOSCOPY WITH PROPOFOL;  Surgeon: [name], MD;  Location: [facility];  Service: Gastroenterology;  Laterality: N/A;   ESOPHAGOGASTRODUODENOSCOPY (EGD) WITH PROPOFOL N/A 01/06/2018   Procedure: ESOPHAGOGASTRODUODENOSCOPY (EGD) WITH PROPOFOL;  Surgeon: [name], MD;  Location: [facility];  Service: Gastroenterology;  Laterality: N/A;   HERNIA REPAIR  04-26-2010   MASS EXCISION Left 01/26/2013   Procedure: EXCISION LEFT CHEST WALL MASS AND LEFT ABDOMNAL WALL MASS;  Surgeon: Harl Bowie, MD;  Location: [facility];  Service: General;  Laterality: Left;   MASS  EXCISION Left 09/20/2014   Procedure: EXCISION OF LEFT CHEST WALL MASS;  Surgeon: Coralie Keens, MD;  Location: [facility] [place];  Service: General;  Laterality: Left;   TEE WITHOUT CARDIOVERSION N/A 12/08/2017   Procedure: TRANSESOPHAGEAL ECHOCARDIOGRAM (TEE);  Surgeon: Lelon Perla, MD;  Location: [place];  Service: Cardiovascular;  Laterality: N/A;    TOTAL HIP ARTHROPLASTY Right 01/05/2017   TOTAL HIP ARTHROPLASTY Right 01/05/2017   Procedure: TOTAL HIP ARTHROPLASTY ANTERIOR APPROACH;  Surgeon: Frederik Pear, MD;  Location: [facility];  Service: Orthopedics;  Laterality: Right;   Current Outpatient Medications on File Prior to Visit  Medication Sig Dispense Refill   amLODipine (NORVASC) 10 MG tablet Take 10 mg by mouth daily with breakfast.      diclofenac Sodium (VOLTAREN) 1 % GEL Apply 2 g topically daily as needed (For topical arthritis.).     HUMULIN N 100 UNIT/ML injection Inject 30 Units into the skin daily.     metFORMIN (GLUCOPHAGE) 500 MG tablet Take 500 mg by mouth daily with breakfast.     olmesartan-hydrochlorothiazide (BENICAR HCT) 40-12.5 MG tablet Take 1 tablet by mouth daily.     rivaroxaban (XARELTO) 20 MG TABS tablet Take 1 tablet (20 mg total) by mouth daily with supper. 90 tablet 3   SM IRON 325 (65 Fe) MG tablet Take 325 mg by mouth daily.     Turmeric (QC TUMERIC COMPLEX PO) Take 1 tablet by mouth daily.     TYLENOL 325 MG tablet Take 325 mg by mouth daily.     VITAMIN D PO Take 1 tablet by mouth daily.     No current facility-administered medications on file prior to visit.    Allergies  Allergen Reactions   Bactrim Swelling    SWELLING OF MOUTH/FACE.   Lisinopril Swelling    SWELLING OF MOUTH/FACE.   Vasotec Swelling    SWELLING OF MOUTH/FACE.   Sulfamethoxazole-Trimethoprim     Other reaction(s): Unknown   Social History   Occupational History   Occupation: retired Education officer, [employer]  Tobacco Use   Smoking status: Former    Types: Cigarettes    Quit date: 04/14/1972    Years since quitting: 49.1   Smokeless tobacco: Never  Vaping Use   Vaping Use: Never used  Substance and Sexual Activity   Alcohol use: Yes    Comment: occasional   Drug use: Yes   Sexual activity: Yes    Birth control/protection: Surgical   History reviewed. No pertinent family history. Immunization History  Administered Date(s)  Administered   Influenza, High Dose Seasonal PF 01/08/2017, 01/12/2019   Influenza, Quadrivalent, Recombinant, Inj, Pf 01/12/2019     Review of Systems: Negative except as noted in the HPI.   Objective: There were no vitals filed for this visit.  Tami Schmidt is a pleasant 88 y.o. female in NAD. AAO X 3.  Vascular Examination: CFT <4 seconds b/l LE. Faintly palpable DP pulses b/l LE. Diminished PT pulse(s) b/l LE. Pedal hair absent. No pain with calf compression b/l. Lower extremity skin temperature gradient within normal limits. Trace edema noted BLE. No cyanosis or clubbing noted b/l LE.  Dermatological Examination: Pedal integument with normal turgor, texture and tone b/l LE. No open wounds b/l. No interdigital macerations b/l. Toenails 1-5 b/l elongated, thickened, discolored with subungual debris. +Tenderness with dorsal palpation of nailplates. Hyperkeratotic lesion(s) noted bilateral 5th toes, submet head 5 right foot, and plantarlateral aspect of midfoot left foot.  Musculoskeletal Examination: Normal muscle  strength 5/5 to all lower extremity muscle groups bilaterally. HAV with bunion deformity noted b/l LE. Hammertoe deformity noted 2-5 b/l. Pes planus deformity noted bilateral LE.Marland Kitchen No pain, crepitus or joint limitation noted with ROM b/l LE.  Patient ambulates independently without assistive aids.  Footwear Assessment: Does the patient wear appropriate shoes? Yes. Does the patient need inserts/orthotics? Yes.  Neurological Examination: Protective sensation intact 5/5 intact bilaterally with 10g monofilament b/l. Vibratory sensation diminished b/l.  Assessment: 1. Pain due to onychomycosis of toenails of both feet   2. Corns and callosities   3. Diabetic peripheral neuropathy associated with type 2 diabetes mellitus ([place])   4. PVD (peripheral vascular disease) ([place])   5. Pes planus of both feet   6. Encounter for diabetic foot exam ([place])     ADA Risk  Categorization: High Risk  Patient has one or more of the following: Loss of protective sensation Absent pedal pulses Severe Foot deformity History of foot ulcer  Plan: -Diabetic foot examination performed today. -Continue foot and shoe inspections daily. Monitor blood glucose per PCP/Endocrinologist's recommendations. -Mycotic toenails 1-5 bilaterally were debrided in length and girth with sterile nail nippers and dremel without incident. -Corn(s) bilateral 5th toes and callus(es) submet head 5 right foot and plantarlateral aspect of midfoot left foot were pared utilizing sterile scalpel blade without incident. Total number debrided =4. -Patient/POA to call should there be question/concern in the interim.  Return in about 3 months (around 08/24/2021).  Marzetta Board, DPM

## 2021-06-05 ENCOUNTER — Other Ambulatory Visit: Payer: Self-pay

## 2021-06-05 ENCOUNTER — Inpatient Hospital Stay: Payer: Medicare Other

## 2021-06-05 ENCOUNTER — Telehealth: Payer: Self-pay | Admitting: Hematology and Oncology

## 2021-06-05 NOTE — Telephone Encounter (Signed)
05/22/21: Patient was brought to scheduling by Alfonse Ras. Louise and I went over the changes that needed to be made to the patients schedule. After the changes were made, I printed and gave the patient an updated calendar.    2/22: Patient was brought to the desk this morning under the impression that she had an injection scheduled. I informed the patient that that injection was cancelled while she was at my desk on 05/22/21. I reminded the patient that the updated calendar that she was given back on 05/22/21 showed the new appointments.   During the conversation with the patient this morning (06/05/21), she then remembered that she was given an updated calendar that day (05/22/21). Patient said she must have looked at an old calendar that she has at home. I told the patient that I would print her another updated calendar with her upcoming appointments (which is scheduled for 06/19/21 at 10:45am. Patient refused another calendar due to having an updated calendar already at her house.  Scheduler will mail out an updated calendar.

## 2021-06-14 ENCOUNTER — Other Ambulatory Visit: Payer: Self-pay | Admitting: Hematology and Oncology

## 2021-06-18 ENCOUNTER — Other Ambulatory Visit: Payer: Self-pay

## 2021-06-18 DIAGNOSIS — C50312 Malignant neoplasm of lower-inner quadrant of left female breast: Secondary | ICD-10-CM

## 2021-06-18 DIAGNOSIS — Z170 Estrogen receptor positive status [ER+]: Secondary | ICD-10-CM

## 2021-06-19 ENCOUNTER — Inpatient Hospital Stay: Payer: Medicare Other

## 2021-06-19 ENCOUNTER — Inpatient Hospital Stay: Payer: Medicare Other | Attending: Adult Health

## 2021-06-19 ENCOUNTER — Inpatient Hospital Stay: Payer: Medicare Other | Admitting: Adult Health

## 2021-06-19 ENCOUNTER — Encounter: Payer: Self-pay | Admitting: Adult Health

## 2021-06-19 ENCOUNTER — Other Ambulatory Visit: Payer: Self-pay

## 2021-06-19 VITALS — BP 164/62 | HR 91 | Resp 18 | Ht 67.0 in | Wt 208.1 lb

## 2021-06-19 DIAGNOSIS — I890 Lymphedema, not elsewhere classified: Secondary | ICD-10-CM | POA: Diagnosis not present

## 2021-06-19 DIAGNOSIS — Z5112 Encounter for antineoplastic immunotherapy: Secondary | ICD-10-CM | POA: Insufficient documentation

## 2021-06-19 DIAGNOSIS — Z170 Estrogen receptor positive status [ER+]: Secondary | ICD-10-CM

## 2021-06-19 DIAGNOSIS — C773 Secondary and unspecified malignant neoplasm of axilla and upper limb lymph nodes: Secondary | ICD-10-CM | POA: Insufficient documentation

## 2021-06-19 DIAGNOSIS — C50312 Malignant neoplasm of lower-inner quadrant of left female breast: Secondary | ICD-10-CM

## 2021-06-19 DIAGNOSIS — M8580 Other specified disorders of bone density and structure, unspecified site: Secondary | ICD-10-CM | POA: Diagnosis not present

## 2021-06-19 DIAGNOSIS — Z5111 Encounter for antineoplastic chemotherapy: Secondary | ICD-10-CM | POA: Diagnosis present

## 2021-06-19 LAB — CBC WITH DIFFERENTIAL (CANCER CENTER ONLY)
Abs Immature Granulocytes: 0.02 10*3/uL (ref 0.00–0.07)
Basophils Absolute: 0.1 10*3/uL (ref 0.0–0.1)
Basophils Relative: 1 %
Eosinophils Absolute: 0.3 10*3/uL (ref 0.0–0.5)
Eosinophils Relative: 4 %
HCT: 41.2 % (ref 36.0–46.0)
Hemoglobin: 12.5 g/dL (ref 12.0–15.0)
Immature Granulocytes: 0 %
Lymphocytes Relative: 27 %
Lymphs Abs: 1.6 10*3/uL (ref 0.7–4.0)
MCH: 24.7 pg — ABNORMAL LOW (ref 26.0–34.0)
MCHC: 30.3 g/dL (ref 30.0–36.0)
MCV: 81.4 fL (ref 80.0–100.0)
Monocytes Absolute: 0.3 10*3/uL (ref 0.1–1.0)
Monocytes Relative: 6 %
Neutro Abs: 3.7 10*3/uL (ref 1.7–7.7)
Neutrophils Relative %: 62 %
Platelet Count: 243 10*3/uL (ref 150–400)
RBC: 5.06 MIL/uL (ref 3.87–5.11)
RDW: 16.1 % — ABNORMAL HIGH (ref 11.5–15.5)
WBC Count: 6 10*3/uL (ref 4.0–10.5)
nRBC: 0 % (ref 0.0–0.2)

## 2021-06-19 LAB — CMP (CANCER CENTER ONLY)
ALT: 10 U/L (ref 0–44)
AST: 13 U/L — ABNORMAL LOW (ref 15–41)
Albumin: 4.3 g/dL (ref 3.5–5.0)
Alkaline Phosphatase: 63 U/L (ref 38–126)
Anion gap: 8 (ref 5–15)
BUN: 17 mg/dL (ref 8–23)
CO2: 27 mmol/L (ref 22–32)
Calcium: 9.4 mg/dL (ref 8.9–10.3)
Chloride: 104 mmol/L (ref 98–111)
Creatinine: 0.69 mg/dL (ref 0.44–1.00)
GFR, Estimated: >60 mL/min (ref >60)
Glucose, Bld: 152 mg/dL — ABNORMAL HIGH (ref 70–99)
Potassium: 3.6 mmol/L (ref 3.5–5.1)
Sodium: 139 mmol/L (ref 135–145)
Total Bilirubin: 0.6 mg/dL (ref 0.3–1.2)
Total Protein: 7.3 g/dL (ref 6.5–8.1)

## 2021-06-19 MED ORDER — SODIUM CHLORIDE 0.9 % IV SOLN: Freq: Once | INTRAVENOUS | Status: AC

## 2021-06-19 MED ORDER — TRASTUZUMAB-ANNS CHEMO 150 MG IV SOLR
6.0000 mg/kg | Freq: Once | INTRAVENOUS | Status: AC
  Administered 2021-06-19: 588 mg via INTRAVENOUS
  Filled 2021-06-19: qty 28

## 2021-06-19 MED ORDER — DIPHENHYDRAMINE HCL 25 MG PO CAPS
50.0000 mg | ORAL_CAPSULE | Freq: Once | ORAL | Status: AC
  Administered 2021-06-19: 50 mg via ORAL
  Filled 2021-06-19: qty 2

## 2021-06-19 MED ORDER — FULVESTRANT 250 MG/5ML IM SOSY
500.0000 mg | PREFILLED_SYRINGE | INTRAMUSCULAR | Status: DC
  Administered 2021-06-19: 500 mg via INTRAMUSCULAR
  Filled 2021-06-19: qty 10

## 2021-06-19 MED ORDER — ACETAMINOPHEN 325 MG PO TABS
650.0000 mg | ORAL_TABLET | Freq: Once | ORAL | Status: AC
  Administered 2021-06-19: 650 mg via ORAL
  Filled 2021-06-19: qty 2

## 2021-06-19 NOTE — Patient Instructions (Addendum)
[facility]  Discharge Instructions: ?Thank you for choosing [facility] to provide your oncology and hematology care.  ? ?If you have a lab appointment with the Cancer Center, please go directly to the Cancer Center and check in at the registration area. ?  ?Wear comfortable clothing and clothing appropriate for easy access to any Portacath or PICC line.  ? ?We strive to give you quality time with your provider. You may need to reschedule your appointment if you arrive late (15 or more minutes).  Arriving late affects you and other patients whose appointments are after yours.  Also, if you miss three or more appointments without notifying the office, you may be dismissed from the clinic at the provider?s discretion.    ?  ?For prescription refill requests, have your pharmacy contact our office and allow 72 hours for refills to be completed.   ? ?Today you received the following chemotherapy and/or immunotherapy agents: Trastuzumab    ?  ?To help prevent nausea and vomiting after your treatment, we encourage you to take your nausea medication as directed. ? ?BELOW ARE SYMPTOMS THAT SHOULD BE REPORTED IMMEDIATELY: ?*FEVER GREATER THAN 100.4 F (38 ?C) OR HIGHER ?*CHILLS OR SWEATING ?*NAUSEA AND VOMITING THAT IS NOT CONTROLLED WITH YOUR NAUSEA MEDICATION ?*UNUSUAL SHORTNESS OF BREATH ?*UNUSUAL BRUISING OR BLEEDING ?*URINARY PROBLEMS (pain or burning when urinating, or frequent urination) ?*BOWEL PROBLEMS (unusual diarrhea, constipation, pain near the anus) ?TENDERNESS IN MOUTH AND THROAT WITH OR WITHOUT PRESENCE OF ULCERS (sore throat, sores in mouth, or a toothache) ?UNUSUAL RASH, SWELLING OR PAIN  ?UNUSUAL VAGINAL DISCHARGE OR ITCHING  ? ?Items with * indicate a potential emergency and should be followed up as soon as possible or go to the Emergency Department if any problems should occur. ? ?Please show the CHEMOTHERAPY ALERT CARD or IMMUNOTHERAPY ALERT CARD at check-in  to the Emergency Department and triage nurse. ? ?Should you have questions after your visit or need to cancel or reschedule your appointment, please contact [facility]  Dept: [phone]  and follow the prompts.  Office hours are 8:00 a.m. to 4:30 p.m. Monday - Friday. Please note that voicemails left after 4:00 p.m. may not be returned until the following business day.  We are closed weekends and major holidays. You have access to a nurse at all times for urgent questions. Please call the main number to the clinic Dept: [phone] and follow the prompts. ? ? ?For any non-urgent questions, you may also contact your provider using MyChart. We now offer e-Visits for anyone 18 and older to request care online for non-urgent symptoms. For details visit [email or web address]. ?  ?Also download the MyChart app! Go to the app store, search "MyChart", open the app, select [facility], and log in with your MyChart username and password. ? ?Due to Covid, a mask is required upon entering the hospital/clinic. If you do not have a mask, one will be given to you upon arrival. For doctor visits, patients may have 1 support person aged 18 or older with them. For treatment visits, patients cannot have anyone with them due to current Covid guidelines and our immunocompromised population.  ? ?

## 2021-06-19 NOTE — Progress Notes (Signed)
[facility] Follow up:    [name], MD [place] [address]   DIAGNOSIS:  Cancer Staging  Chest wall recurrence of breast cancer [facility]) Staging form: Breast, AJCC 7th Edition - Clinical: Stage Unknown ([place], N1, M0) - Signed by Chauncey Cruel, MD on 07/27/2014  Malignant neoplasm of lower-inner quadrant of left breast in female, estrogen receptor positive ([place]) Staging form: Breast, AJCC 7th Edition - Clinical: Stage IA (T1b, N0, M0) - Signed by Chauncey Cruel, MD on 07/27/2014   SUMMARY OF ONCOLOGIC HISTORY: 88 y.o. [place] woman   (1) status post right lumpectomy and axillary dissection in 1993 in [place] for what appears to have been a stage I breast cancer, treated with radiation and then tamoxifen for 5 years   (2) status post right mastectomy 2002 for a recurrence in the right breast, treated adjuvantly with tamoxifen for an additional 5 years   (3) status post left mastectomy and sentinel lymph node sampling 03/07/2003 for a lower inner quadrant pT1b, pN0, stage IA mucinous breast cancer, grade 2; no radiation therapy was given. Tamoxifen was continued until 2009   RECURRENT DISEASE/ LEFT: OCTOBER 2014 (4) excisional biopsy of a left chest wall mass 01/26/2013 showing carcinoma consistent with invasive ductal carcinoma, estrogen receptor 100% positive, progesterone receptor 0% positive, with an MIB-1 of 22% and no HER-2 amplification.    (5)  PET scan on 03/03/2013 confirmed extensive metastatic adenopathy in the left axilla, left subpectoral musculature, and left supraclavicular region   (6)  started on anastrozole daily beginning 03/01/2013, interrupted during radiation therapy, resumed March 2015, discontinued March 2016 because of fatigue and arthralgias   (7) radiation therapy 03/31/2013 through 05/23/2013 Site/dose:   The patient was treated initially with a forward treatment planning technique to the left  chest wall in addition to treatment to the supraclavicular region. This consisted of a 3-D conformal technique. The patient was treated in this fashion to a dose of 50.4 gray. The patient then received a 14 gray boost treatment using an electron field. The total dose was 64.4 gray.   (8) Osteopenia, zometa yearly started 07/27/2014, but poorly tolerated.              (a) bone density scan on 12/27/13 showed a t-score of -1.8              (b) started Denosumab/Prolia April 2017, canceled after one dose per patient   (9) tamoxifen started 07/27/2014, stopped November 2019 (had total 5 years of antiestrogens)   (10) likely thalassemia trait   (11) left subpectoral soft tissue nodule noted 07/17/2015 unchanged through serial scans, most recent 59/74/1638, with no metabolic activity noted in PET scan of 2016 and interval calcification.  This is presumed benign   (12)  ADDITIONAL REGIONAL RECURRENCE:             (A) CT of the chest with contrast 03/05/2021 obtained to evaluate left upper extremity lymphedema as showed acute pulmonary emboli.  There was also left axillary and subpectoral lymphadenopathy             (B) rivaroxaban started 03/05/2021             (C) left breast ultrasound 03/29/2021 shows 2 areas in the infraclavicular tissue consistent with fat necrosis.  There were however multiple retropectoral interpectoral and left axillary masses.             (D) left axillary lymph node biopsy 04/04/2021: IDC  grade 3, ER positive, 100%, strong staining intensity, PR positive, 20%, weak staining intensity, Ki-67 of 15%, HER2 positive by IHC, ratio 2.90             (E) Fulvestrant every 4 weeks beginning 04/18/2021             (F) Herceptin every 3 weeks beginning 05/30/2021      CURRENT THERAPY: Trastuzumab & Fulvestrant  INTERVAL HISTORY: ALYXANDRIA WENTZ 88 y.o. female returns for follow-up of her estrogen positive progesterone positive and HER2 positive breast cancer recurrence diagnosed in  November 2022.  She is receiving fulvestrant every 4 weeks and Herceptin every 3 weeks.  Her most recent echocardiogram was completed on May 06, 2021, this showed a normal ejection fraction of 60 to 65%.  Destina tells me that she is doing moderately well today.  She notes that her left arm swelling is slowly improving.  She is using a pump as well for this lymphedema.  Glenys's most recent restaging scans were completed on May 07, 2021.  These showed left axillary subpectoral and supraclavicular mediastinal adenopathy that was stable.  They also confirmed hepatic steatosis, cholelithiasis, proximal celiac stenosis, aortic atherosclerosis, pulmonary arterial hypertension, and emphysema.  Bone scan on May 07, 2021 also showed no evidence of bony metastatic disease   Patient Active Problem List   Diagnosis Date Noted   Microcytosis 04/29/2021   Hypokalemia 04/28/2021   SBO (small bowel obstruction) ([place]) 04/27/2021   Pulmonary embolism ([place])    Agnosia 08/09/2020   Hardening of the aorta (main artery of the heart) ([place]) 08/09/2020   Morbid obesity ([place]) 08/09/2020   Neuropathy 08/09/2020   Primary osteoarthritis 08/09/2020   Sensorineural hearing loss (SNHL) of both ears 08/07/2020   Anosmia 06/05/2020   Tinnitus, bilateral 06/05/2020   GI bleed 07/13/2019   Acute blood loss anemia 07/12/2019   Microcytic anemia 01/05/2018   Melena 01/04/2018   Lower GI bleed 01/04/2018   Type 2 diabetes mellitus without complication (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/25/2013   Diabetes mellitus    fasting 90-100   HTN (hypertension) 03/25/2013   Hx of radiation therapy    breasts hx   Hypercholesterolemia    Hypertension    Lymphedema of arm    left arm   Pulmonary embolism ([facility])    Type II or unspecified type diabetes mellitus without mention of complication, not stated  as uncontrolled 03/25/2013    SURGICAL HISTORY: Past Surgical History:  Procedure Laterality Date   ABDOMINAL HYSTERECTOMY     ANKLE SURGERY Right 1995   APPENDECTOMY     BIOPSY  01/07/2018   Procedure: BIOPSY;  Surgeon: [name], MD;  Location: [facility];  Service: Gastroenterology;;   BREAST SURGERY Bilateral    mastectomy   COLONOSCOPY WITH PROPOFOL N/A 01/07/2018   Procedure: COLONOSCOPY WITH PROPOFOL;  Surgeon: [name], MD;  Location: [facility];  Service: Gastroenterology;  Laterality: N/A;   ESOPHAGOGASTRODUODENOSCOPY (EGD) WITH PROPOFOL N/A 01/06/2018   Procedure: ESOPHAGOGASTRODUODENOSCOPY (EGD) WITH PROPOFOL;  Surgeon: [name], MD;  Location: [facility];  Service: Gastroenterology;  Laterality: N/A;   HERNIA REPAIR  04-26-2010   MASS EXCISION Left 01/26/2013   Procedure: EXCISION LEFT CHEST WALL MASS AND LEFT ABDOMNAL WALL MASS;  Surgeon: Harl Bowie, MD;  Location: [facility];  Service: General;  Laterality: Left;   MASS EXCISION Left 09/20/2014   Procedure: EXCISION OF LEFT CHEST WALL MASS;  Surgeon: Coralie Keens, MD;  Location: [facility];  Service: General;  Laterality: Left;   TEE WITHOUT CARDIOVERSION N/A 12/08/2017   Procedure: TRANSESOPHAGEAL ECHOCARDIOGRAM (TEE);  Surgeon: Lelon Perla, MD;  Location: [facility];  Service: Cardiovascular;  Laterality: N/A;   TOTAL HIP ARTHROPLASTY Right 01/05/2017   TOTAL HIP ARTHROPLASTY Right 01/05/2017   Procedure: TOTAL HIP ARTHROPLASTY ANTERIOR APPROACH;  Surgeon: Frederik Pear, MD;  Location: [facility];  Service: Orthopedics;  Laterality: Right;    SOCIAL HISTORY: Social History   Socioeconomic History   Marital status: Married    Spouse name: Not on file   Number of children: Not on file   Years of education: Not on file   Highest education level: Not on file  Occupational History   Occupation: retired Education officer, [employer]  Tobacco Use   Smoking status: Former    Types: Cigarettes    Quit date: 04/14/1972    Years since quitting: 49.2   Smokeless tobacco: Never  Vaping Use   Vaping Use: Never used  Substance and Sexual Activity   Alcohol use: Yes    Comment: occasional   Drug use: Yes   Sexual activity: Yes    Birth control/protection: Surgical  Other Topics Concern   Not on file  Social History Narrative   Not on file   Social Determinants of Health   Financial Resource Strain: Not on file  Food Insecurity: Not on file  Transportation Needs: Not on file  Physical Activity: Not on file  Stress: Not on file  Social Connections: Not on file  Intimate Partner Violence: Not on file    FAMILY HISTORY: History reviewed. No pertinent family history.  Review of Systems  Constitutional:  Negative for appetite change, chills, fatigue, fever and unexpected weight change.  HENT:   Negative for hearing loss, lump/mass and trouble swallowing.   Eyes:   Negative for eye problems and icterus.  Respiratory:  Negative for chest tightness, cough and shortness of breath.   Cardiovascular:  Negative for chest pain, leg swelling and palpitations.  Gastrointestinal:  Negative for abdominal distention, abdominal pain, constipation, diarrhea, nausea and vomiting.  Endocrine: Negative for hot flashes.  Genitourinary:  Negative for difficulty urinating.   Musculoskeletal:  Negative for arthralgias.  Skin:  Negative for itching and rash.  Neurological:  Negative for dizziness, extremity weakness, headaches and numbness.  Hematological:  Negative for adenopathy. Does not bruise/bleed easily.  Psychiatric/Behavioral:  Negative for  depression. The patient is not nervous/anxious.      PHYSICAL EXAMINATION  ECOG PERFORMANCE STATUS: 1 - Symptomatic but completely ambulatory  Vitals:   06/19/21 1040  BP: (!) 164/62  Pulse: 91  Resp: 18  SpO2: 100%    Physical Exam Constitutional:      General: She is not in acute distress.    Appearance: Normal appearance. She is not toxic-appearing.  HENT:     Head: Normocephalic and atraumatic.  Eyes:     General: No scleral icterus. Cardiovascular:     Rate and Rhythm: Normal rate and regular rhythm.     Pulses: Normal pulses.     Heart sounds: Normal heart sounds.  Pulmonary:     Effort: Pulmonary effort is normal.     Breath sounds: Normal breath sounds.  Abdominal:     General: Abdomen is flat. Bowel sounds are normal. There is no distension.     Palpations: Abdomen is soft.     Tenderness: There is no abdominal tenderness.  Musculoskeletal:        General: No swelling.     Cervical back: Neck supple.  Lymphadenopathy:     Cervical: No cervical adenopathy.  Skin:    General: Skin is warm and dry.     Findings: No rash.  Neurological:     General: No focal deficit present.     Mental Status: She is alert.  Psychiatric:        Mood and Affect: Mood normal.        Behavior: Behavior normal.     LABORATORY DATA:  CBC    Component Value Date/Time   WBC 6.0 06/19/2021 1031   WBC 6.5 04/30/2021 0334   RBC 5.06 06/19/2021 1031   HGB 12.5 06/19/2021 1031   HGB 10.5 (L) 01/20/2017 1057   HCT 41.2 06/19/2021 1031   HCT 25.8 (L) 01/05/2018 0513   HCT 32.8 (L) 01/20/2017 1057   PLT 243 06/19/2021 1031   PLT 368 01/20/2017 1057   MCV 81.4 06/19/2021 1031   MCV 77.8 (L) 01/20/2017 1057   MCH 24.7 (L) 06/19/2021 1031   MCHC 30.3 06/19/2021 1031   RDW 16.1 (H) 06/19/2021 1031   RDW 16.0 (H) 01/20/2017 1057   LYMPHSABS 1.6 06/19/2021 1031   LYMPHSABS 1.3 01/20/2017 1057   MONOABS 0.3 06/19/2021 1031   MONOABS 0.4 01/20/2017 1057   EOSABS 0.3 06/19/2021 1031   EOSABS 0.1 01/20/2017 1057   BASOSABS 0.1 06/19/2021 1031   BASOSABS 0.1 01/20/2017 1057    CMP     Component Value Date/Time   NA 139 06/19/2021 1031   NA 139 01/20/2017 1057   K 3.6 06/19/2021 1031   K 3.9 01/20/2017 1057   CL 104 06/19/2021 1031   CO2 27 06/19/2021 1031   CO2 25 01/20/2017 1057   GLUCOSE 152 (H) 06/19/2021 1031   GLUCOSE 135 01/20/2017 1057   BUN 17 06/19/2021 1031   BUN 15.6 01/20/2017 1057   CREATININE 0.69 06/19/2021 1031   CREATININE 0.7 01/20/2017 1057   CALCIUM 9.4 06/19/2021 1031   CALCIUM 9.3 01/20/2017 1057   PROT 7.3 06/19/2021 1031   PROT 7.1 01/20/2017 1057   ALBUMIN 4.3 06/19/2021 1031   ALBUMIN 3.4 (L) 01/20/2017 1057   AST 13 (L) 06/19/2021 1031   AST 10 01/20/2017 1057   ALT 10 06/19/2021 1031   ALT 14 01/20/2017 1057   ALKPHOS 63 06/19/2021 1031   ALKPHOS 69 01/20/2017 1057   BILITOT 0.6  06/19/2021 1031   BILITOT 0.43 01/20/2017 1057   GFRNONAA >60 06/19/2021 1031   GFRAA >60 07/14/2019 0538     ASSESSMENT and THERAPY PLAN:   Malignant neoplasm of lower-inner quadrant of left breast in female, estrogen receptor positive ([facility]) Matilynn Mrs. 88 year old woman who is here today for follow-up of her triple positive breast cancer recurrence in her left axilla,  subpectoral, and supraclavicular lymph nodes.  1.  Recurrent regional triple positive breast cancer recurrence: She will continue on fulvestrant every 4 weeks and Herceptin every 3 weeks.  She is tolerating treatment well.  She has no clinical signs of progression of her breast cancer.  She will be due for echocardiogram in April 2023 and I placed orders for this today.  2.  Left arm lymphedema.  This is slowly improving.  I recommended that she continue to follow-up with physical therapy and use her lymphedema pump as instructed.  3.  We discussed healthy diet and exercise and I recommended she take small walks throughout the day.  Amel will return in 3 weeks for labs, follow-up, her next Herceptin.   All questions were answered. The patient knows to call the clinic with any problems, questions or concerns. We can certainly see the patient much sooner if necessary.  Total encounter time: 20 minutes in face-to-face visit time, chart review, lab review, care coordination, order entry, and documentation of the encounter.  Wilber Bihari, NP 06/20/21 8:00 AM Medical Oncology and Hematology [facility] [place], [place] [address] Tel. [phone]    Fax. [phone]  *Total Encounter Time as defined by the Centers for Medicare and Medicaid Services includes, in addition to the face-to-face time of a patient visit (documented in the note above) non-face-to-face time: obtaining and reviewing outside history, ordering and reviewing medications, tests or procedures, care coordination (communications with other health care professionals or caregivers) and documentation in the medical record.

## 2021-06-20 ENCOUNTER — Encounter: Payer: Self-pay | Admitting: Oncology

## 2021-06-20 ENCOUNTER — Encounter: Payer: Self-pay | Admitting: Adult Health

## 2021-06-20 ENCOUNTER — Encounter: Payer: Self-pay | Admitting: Hematology and Oncology

## 2021-06-20 NOTE — Assessment & Plan Note (Signed)
Tami Schmidt Mrs. 88 year old woman who is here today for follow-up of her triple positive breast cancer recurrence in her left axilla, subpectoral, and supraclavicular lymph nodes. ? ?1.  Recurrent regional triple positive breast cancer recurrence: She will continue on fulvestrant every 4 weeks and Herceptin every 3 weeks.  She is tolerating treatment well.  She has no clinical signs of progression of her breast cancer.  She will be due for echocardiogram in April 2023 and I placed orders for this today. ? ?2.  Left arm lymphedema.  This is slowly improving.  I recommended that she continue to follow-up with physical therapy and use her lymphedema pump as instructed. ? ?3.  We discussed healthy diet and exercise and I recommended she take small walks throughout the day. ? ?Baneen will return in 3 weeks for labs, follow-up, her next Herceptin. ?

## 2021-07-03 ENCOUNTER — Ambulatory Visit: Payer: Medicare Other

## 2021-07-11 ENCOUNTER — Inpatient Hospital Stay: Payer: Medicare Other | Admitting: Hematology and Oncology

## 2021-07-11 ENCOUNTER — Other Ambulatory Visit: Payer: Self-pay | Admitting: Genetic Counselor

## 2021-07-11 ENCOUNTER — Inpatient Hospital Stay (HOSPITAL_BASED_OUTPATIENT_CLINIC_OR_DEPARTMENT_OTHER): Payer: Medicare Other | Admitting: Genetic Counselor

## 2021-07-11 ENCOUNTER — Other Ambulatory Visit: Payer: Self-pay | Admitting: *Deleted

## 2021-07-11 ENCOUNTER — Encounter: Payer: Self-pay | Admitting: Hematology and Oncology

## 2021-07-11 ENCOUNTER — Inpatient Hospital Stay: Payer: Medicare Other

## 2021-07-11 ENCOUNTER — Other Ambulatory Visit: Payer: Self-pay

## 2021-07-11 VITALS — BP 162/80 | HR 83 | Temp 97.9°F | Resp 18 | Ht 67.0 in | Wt 208.6 lb

## 2021-07-11 DIAGNOSIS — Z170 Estrogen receptor positive status [ER+]: Secondary | ICD-10-CM | POA: Diagnosis not present

## 2021-07-11 DIAGNOSIS — C50312 Malignant neoplasm of lower-inner quadrant of left female breast: Secondary | ICD-10-CM

## 2021-07-11 DIAGNOSIS — Z1379 Encounter for other screening for genetic and chromosomal anomalies: Secondary | ICD-10-CM

## 2021-07-11 DIAGNOSIS — C7989 Secondary malignant neoplasm of other specified sites: Secondary | ICD-10-CM

## 2021-07-11 DIAGNOSIS — C50919 Malignant neoplasm of unspecified site of unspecified female breast: Secondary | ICD-10-CM

## 2021-07-11 DIAGNOSIS — Z5112 Encounter for antineoplastic immunotherapy: Secondary | ICD-10-CM | POA: Diagnosis not present

## 2021-07-11 LAB — CBC WITH DIFFERENTIAL (CANCER CENTER ONLY)
Abs Immature Granulocytes: 0.01 10*3/uL (ref 0.00–0.07)
Basophils Absolute: 0 10*3/uL (ref 0.0–0.1)
Basophils Relative: 1 %
Eosinophils Absolute: 0.2 10*3/uL (ref 0.0–0.5)
Eosinophils Relative: 4 %
HCT: 39.2 % (ref 36.0–46.0)
Hemoglobin: 12.2 g/dL (ref 12.0–15.0)
Immature Granulocytes: 0 %
Lymphocytes Relative: 27 %
Lymphs Abs: 1.3 10*3/uL (ref 0.7–4.0)
MCH: 25 pg — ABNORMAL LOW (ref 26.0–34.0)
MCHC: 31.1 g/dL (ref 30.0–36.0)
MCV: 80.3 fL (ref 80.0–100.0)
Monocytes Absolute: 0.3 10*3/uL (ref 0.1–1.0)
Monocytes Relative: 7 %
Neutro Abs: 2.9 10*3/uL (ref 1.7–7.7)
Neutrophils Relative %: 61 %
Platelet Count: 213 10*3/uL (ref 150–400)
RBC: 4.88 MIL/uL (ref 3.87–5.11)
RDW: 15.5 % (ref 11.5–15.5)
WBC Count: 4.7 10*3/uL (ref 4.0–10.5)
nRBC: 0 % (ref 0.0–0.2)

## 2021-07-11 LAB — CMP (CANCER CENTER ONLY)
ALT: 10 U/L (ref 0–44)
AST: 11 U/L — ABNORMAL LOW (ref 15–41)
Albumin: 4 g/dL (ref 3.5–5.0)
Alkaline Phosphatase: 59 U/L (ref 38–126)
Anion gap: 8 (ref 5–15)
BUN: 20 mg/dL (ref 8–23)
CO2: 27 mmol/L (ref 22–32)
Calcium: 8.9 mg/dL (ref 8.9–10.3)
Chloride: 103 mmol/L (ref 98–111)
Creatinine: 0.71 mg/dL (ref 0.44–1.00)
GFR, Estimated: >60 mL/min (ref >60)
Glucose, Bld: 196 mg/dL — ABNORMAL HIGH (ref 70–99)
Potassium: 4 mmol/L (ref 3.5–5.1)
Sodium: 138 mmol/L (ref 135–145)
Total Bilirubin: 0.6 mg/dL (ref 0.3–1.2)
Total Protein: 6.9 g/dL (ref 6.5–8.1)

## 2021-07-11 LAB — GENETIC SCREENING ORDER

## 2021-07-11 MED ORDER — TRASTUZUMAB-ANNS CHEMO 150 MG IV SOLR
6.0000 mg/kg | Freq: Once | INTRAVENOUS | Status: AC
  Administered 2021-07-11: 588 mg via INTRAVENOUS
  Filled 2021-07-11: qty 28

## 2021-07-11 MED ORDER — ACETAMINOPHEN 325 MG PO TABS: 650.0000 mg | ORAL_TABLET | Freq: Once | ORAL | Status: AC

## 2021-07-11 MED ORDER — DIPHENHYDRAMINE HCL 25 MG PO CAPS
ORAL_CAPSULE | ORAL | Status: AC
  Filled 2021-07-11: qty 2

## 2021-07-11 MED ORDER — DIPHENHYDRAMINE HCL 25 MG PO CAPS
50.0000 mg | ORAL_CAPSULE | Freq: Once | ORAL | Status: AC
  Administered 2021-07-11: 50 mg via ORAL

## 2021-07-11 MED ORDER — ACETAMINOPHEN 325 MG PO TABS
ORAL_TABLET | ORAL | Status: AC
  Administered 2021-07-11: 650 mg via ORAL
  Filled 2021-07-11: qty 2

## 2021-07-11 MED ORDER — SODIUM CHLORIDE 0.9 % IV SOLN: Freq: Once | INTRAVENOUS | Status: AC

## 2021-07-11 NOTE — Progress Notes (Signed)
[facility] Follow up: ?  ? ?[name], MD ?[address] ?[place] [address] ? ? ?DIAGNOSIS:  Cancer Staging  ?Chest wall recurrence of breast cancer ([place]) ?Staging form: Breast, AJCC 7th Edition ?- Clinical: Stage Unknown (TX, N1, M0) - Signed by Chauncey Cruel, MD on 07/27/2014 ? ?Malignant neoplasm of lower-inner quadrant of left breast in female, estrogen receptor positive ([place]) ?Staging form: Breast, AJCC 7th Edition ?- Clinical: Stage IA (T1b, N0, M0) - Signed by Chauncey Cruel, MD on 07/27/2014 ? ? ?SUMMARY OF ONCOLOGIC HISTORY: ? ?88 y.o. [place] woman ?  ?(1) status post right lumpectomy and axillary dissection in 1993 in [place] for what appears to have been a stage I breast cancer, treated with radiation and then tamoxifen for 5 years ?  ?(2) status post right mastectomy 2002 for a recurrence in the right breast, treated adjuvantly with tamoxifen for an additional 5 years ?  ?(3) status post left mastectomy and sentinel lymph node sampling 03/07/2003 for a lower inner quadrant pT1b, pN0, stage IA mucinous breast cancer, grade 2; no radiation therapy was given. Tamoxifen was continued until 2009 ?  ?RECURRENT DISEASE/ LEFT: OCTOBER 2014 ?(4) excisional biopsy of a left chest wall mass 01/26/2013 showing carcinoma consistent with invasive ductal carcinoma, estrogen receptor 100% positive, progesterone receptor 0% positive, with an MIB-1 of 22% and no HER-2 amplification.  ?  ?(5)  PET scan on 03/03/2013 confirmed extensive metastatic adenopathy in the left axilla, left subpectoral musculature, and left supraclavicular region ?  ?(6)  started on anastrozole daily beginning 03/01/2013, interrupted during radiation therapy, resumed March 2015, discontinued March 2016 because of fatigue and arthralgias ?  ?(7) radiation therapy 03/31/2013 through 05/23/2013 ?Site/dose:   The patient was treated initially with a forward treatment planning technique to the  left chest wall in addition to treatment to the supraclavicular region. This consisted of a 3-D conformal technique. The patient was treated in this fashion to a dose of 50.4 gray. The patient then received a 14 gray boost treatment using an electron field. The total dose was 64.4 gray. ?  ?(8) Osteopenia, zometa yearly started 07/27/2014, but poorly tolerated.  ?            (a) bone density scan on 12/27/13 showed a t-score of -1.8  ?            (b) started Denosumab/Prolia April 2017, canceled after one dose per patient ?  ?(9) tamoxifen started 07/27/2014, stopped November 2019 (had total 5 years of antiestrogens) ?  ?(10) likely thalassemia trait ?  ?(11) left subpectoral soft tissue nodule noted 07/17/2015 unchanged through serial scans, most recent 71/69/6789, with no metabolic activity noted in PET scan of 2016 and interval calcification.  This is presumed benign ?  ?(12)  ADDITIONAL REGIONAL RECURRENCE: ?            (A) CT of the chest with contrast 03/05/2021 obtained to evaluate left upper extremity lymphedema as showed acute pulmonary emboli.  There was also left axillary and subpectoral lymphadenopathy ?            (B) rivaroxaban started 03/05/2021 ?            (C) left breast ultrasound 03/29/2021 shows 2 areas in the infraclavicular tissue consistent with fat necrosis.  There were however multiple retropectoral interpectoral and left axillary masses. ?            (D) left axillary lymph node biopsy 04/04/2021:  IDC grade 3, ER positive, 100%, strong staining intensity, PR positive, 20%, weak staining intensity, Ki-67 of 15%, HER2 positive by IHC, ratio 2.90 ?            (E) Fulvestrant every 4 weeks beginning 04/18/2021 ?            (F) Herceptin every 3 weeks beginning 05/30/2021 ?  ? ?CURRENT THERAPY: Trastuzumab & Fulvestrant ? ?INTERVAL HISTORY: ? ?Giulianna Rocha Seifer 88 y.o. female returns for follow-up of her estrogen positive progesterone positive and HER2 positive breast cancer recurrence diagnosed in  November 2022.   ? ?She is receiving fulvestrant every 4 weeks and Herceptin every 3 weeks.  Her most recent echocardiogram was completed on May 06, 2021, this showed a normal ejection fraction of 60 to 65%. ? ?Patient presented here for follow-up today.  She tells me that she does not feel well overall, generalized fatigue.  She thinks her left swelling is actually getting worse.  She also felt a couple nodules on her chest that she wanted [place] to examine.  She denies any changes in breathing or bowel habits or urinary habits.  She is thankful that the Herceptin is very easy to tolerate without any major toxicities.  She is however worried if the cancer is not responding to the current treatment.  Rest of the pertinent 10 point ROS reviewed and negative. ? ? ?Patient Active Problem List  ? Diagnosis Date Noted  ? Microcytosis 04/29/2021  ? Hypokalemia 04/28/2021  ? SBO (small bowel obstruction) ([place]) 04/27/2021  ? Pulmonary embolism ([place])   ? 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 Hardening of the aorta (main artery of the heart) ([place]) 08/09/2020  ? Morbid obesity ([place]) 08/09/2020  ? Neuropathy 08/09/2020  ? Primary osteoarthritis 08/09/2020  ? Sensorineural hearing loss (SNHL) of both ears 08/07/2020  ? Anosmia 06/05/2020  ? Tinnitus, bilateral 06/05/2020  ? GI bleed 07/13/2019  ? Acute blood loss anemia 07/12/2019  ? Microcytic anemia 01/05/2018  ? Melena 01/04/2018  ? Lower GI bleed 01/04/2018  ? Type 2 diabetes mellitus without complication ([place]) 94/70/9628  ? Bacteremia due to methicillin susceptible Staphylococcus aureus (MSSA) 12/02/2017  ? Tenosynovitis of left wrist 12/02/2017  ? Infection of left wrist ([place]) 11/30/2017  ? Primary osteoarthritis of right hip 01/05/2017  ? Osteoarthritis of right hip 01/03/2017  ? Lymphedema of upper extremity 01/17/2014  ? Osteopenia 01/17/2014  ? 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  ? 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  ? Dilated pore of [place] 08/02/2013  ? Female pattern  alopecia 08/02/2013  ? Scar 08/02/2013  ? Malignant neoplasm of lower-inner quadrant of left breast in female, estrogen receptor positive ([place]) 06/16/2013  ? Type II or unspecified type diabetes mellitus without mention of complication, not stated as uncontrolled 03/25/2013  ? Essential hypertension 03/25/2013  ? Chest wall recurrence of breast cancer ([place]) 02/21/2013  ? Abdominal wall mass 01/14/2013  ? ? ?is allergic to bactrim, lisinopril, vasotec, and sulfamethoxazole-trimethoprim. ? ?MEDICAL HISTORY: ?Past Medical History:  ?Diagnosis Date  ? Arthritis   ? Breast cancer ([place])   ? b/l mastectomies hx  ? Cancer [facility])   ? breast  ? 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 Diabetes mellitus   ? fasting 90-100  ? HTN (hypertension) 03/25/2013  ? Hx of radiation therapy   ? breasts hx  ? Hypercholesterolemia   ? Hypertension   ? Lymphedema of arm   ? left arm  ? Pulmonary embolism ([place])   ?  Type II or unspecified type diabetes mellitus without mention of complication, not stated as uncontrolled 03/25/2013  ? ? ?SURGICAL HISTORY: ?Past Surgical History:  ?Procedure Laterality Date  ? ABDOMINAL HYSTERECTOMY    ? ANKLE SURGERY Right 1995  ? APPENDECTOMY    ? BIOPSY  01/07/2018  ? Procedure: BIOPSY;  Surgeon: [name], MD;  Location: [facility];  Service: Gastroenterology;;  ? BREAST SURGERY Bilateral   ? mastectomy  ? COLONOSCOPY WITH PROPOFOL N/A 01/07/2018  ? Procedure: COLONOSCOPY WITH PROPOFOL;  Surgeon: [name], MD;  Location: [facility];  Service: Gastroenterology;  Laterality: N/A;  ? ESOPHAGOGASTRODUODENOSCOPY (EGD) WITH PROPOFOL N/A 01/06/2018  ? Procedure: ESOPHAGOGASTRODUODENOSCOPY (EGD) WITH PROPOFOL;  Surgeon: [name], MD;  [facility]: [facility];  Service: Gastroenterology;  Laterality: N/A;  ? HERNIA REPAIR  04-26-2010  ? MASS EXCISION Left 01/26/2013  ? Procedure: EXCISION LEFT CHEST WALL MASS AND LEFT ABDOMNAL WALL MASS;  Surgeon: Harl Bowie, MD;  Location: [facility];   Service: General;  Laterality: Left;  ? MASS EXCISION Left 09/20/2014  ? Procedure: EXCISION OF LEFT CHEST WALL MASS;  Surgeon: Coralie Keens, MD;  Location: [facility];  Service: General;

## 2021-07-11 NOTE — Patient Instructions (Signed)
[facility] MEDICAL ONCOLOGY  Discharge Instructions: ?Thank you for choosing [facility] to provide your oncology and hematology care.  ? ?If you have a lab appointment with the Cancer Center, please go directly to the Cancer Center and check in at the registration area. ?  ?Wear comfortable clothing and clothing appropriate for easy access to any Portacath or PICC line.  ? ?We strive to give you quality time with your provider. You may need to reschedule your appointment if you arrive late (15 or more minutes).  Arriving late affects you and other patients whose appointments are after yours.  Also, if you miss three or more appointments without notifying the office, you may be dismissed from the clinic at the provider?s discretion.    ?  ?For prescription refill requests, have your pharmacy contact our office and allow 72 hours for refills to be completed.   ? ?Today you received the following chemotherapy and/or immunotherapy agents: Trastuzumab    ?  ?To help prevent nausea and vomiting after your treatment, we encourage you to take your nausea medication as directed. ? ?BELOW ARE SYMPTOMS THAT SHOULD BE REPORTED IMMEDIATELY: ?*FEVER GREATER THAN 100.4 F (38 ?C) OR HIGHER ?*CHILLS OR SWEATING ?*NAUSEA AND VOMITING THAT IS NOT CONTROLLED WITH YOUR NAUSEA MEDICATION ?*UNUSUAL SHORTNESS OF BREATH ?*UNUSUAL BRUISING OR BLEEDING ?*URINARY PROBLEMS (pain or burning when urinating, or frequent urination) ?*BOWEL PROBLEMS (unusual diarrhea, constipation, pain near the anus) ?TENDERNESS IN MOUTH AND THROAT WITH OR WITHOUT PRESENCE OF ULCERS (sore throat, sores in mouth, or a toothache) ?UNUSUAL RASH, SWELLING OR PAIN  ?UNUSUAL VAGINAL DISCHARGE OR ITCHING  ? ?Items with * indicate a potential emergency and should be followed up as soon as possible or go to the Emergency Department if any problems should occur. ? ?Please show the CHEMOTHERAPY ALERT CARD or IMMUNOTHERAPY ALERT CARD at check-in  to the Emergency Department and triage nurse. ? ?Should you have questions after your visit or need to cancel or reschedule your appointment, please contact [facility]ICAL [facility]COL[facility]  Dept: [phone]  and follow the prompts.  Office hours are 8:00 a.m. to 4:30 p.m. Monday - Friday. Please note that voicemails left after 4:00 p.m. may not be returned until the following business day.  We are closed weekends and major holidays. You have access to a nurse at all times for urgent questions. Please call the main number to the clinic Dept: [phone] and follow the prompts. ? ? ?For any non-urgent questions, you may also contact your provider using MyChart. We now offer e-Visits for anyone 18 and older to request care online for non-urgent symptoms. For details visit [email or web address]. ?  ?Also download the MyChart app! Go to the app store, search "MyChart", open the app, select [facility], and log in with your MyChart username and password. ? ?Due to Covid, a mask is required upon entering the hospital/clinic. If you do not have a mask, one will be given to you upon arrival. For doctor visits, patients may have 1 support person aged 18 or older with them. For treatment visits, patients cannot have anyone with them due to current Covid guidelines and our immunocompromised population.  ? ?

## 2021-07-12 ENCOUNTER — Encounter: Payer: Self-pay | Admitting: Genetic Counselor

## 2021-07-12 ENCOUNTER — Encounter: Payer: Self-pay | Admitting: Hematology and Oncology

## 2021-07-12 ENCOUNTER — Encounter: Payer: Self-pay | Admitting: Oncology

## 2021-07-12 LAB — CANCER ANTIGEN 27.29: CA 27.29: 41.9 U/mL — ABNORMAL HIGH (ref 0.0–38.6)

## 2021-07-12 NOTE — Progress Notes (Signed)
REFERRING PROVIDER: ?Staci Acosta, MD ?[address] ?[place], [place] [address] ? ?PRIMARY PROVIDER:  ?[name], MD ? ?PRIMARY REASON FOR VISIT:  ?1. Malignant neoplasm of lower-inner quadrant of left breast in female, estrogen receptor positive ([place])   ? ? ?HISTORY OF PRESENT ILLNESS:   ?Tami Schmidt, a 88 y.o. female, was seen for a [facility] cancer genetics consultation at the request of Dr. Chryl Heck due to a personal history of cancer.  Tami Schmidt presents to clinic today to discuss the possibility of a hereditary predisposition to cancer, to discuss genetic testing, and to further clarify her future cancer risks, as well as potential cancer risks for family members.  ? ?Tami Schmidt was diagnosed with right breast cancer at approximately age 58 and left breast cancer at approximately age 52. ? ?CANCER HISTORY:  ?Oncology History  ?Malignant neoplasm of lower-inner quadrant of left breast in female, estrogen receptor positive ([place])  ?06/16/2013 Initial Diagnosis  ? Malignant neoplasm of lower-inner quadrant of left breast in female, estrogen receptor positive ([place]) ?  ?05/30/2021 -  Chemotherapy  ? Patient is on Treatment Plan : BREAST Trastuzumab q21d  ?   ? ? ?Past Medical History:  ?Diagnosis Date  ? Arthritis   ? Breast cancer ([place])   ? b/l mastectomies hx  ? Cancer [facility])   ? breast  ? 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 Diabetes mellitus   ? fasting 90-100  ? HTN (hypertension) 03/25/2013  ? Hx of radiation therapy   ? breasts hx  ? Hypercholesterolemia   ? Hypertension   ? Lymphedema of arm   ? left arm  ? Pulmonary embolism ([place])   ? Type II or unspecified type diabetes mellitus without mention of complication, not stated as uncontrolled 03/25/2013  ? ? ?Past Surgical History:  ?Procedure Laterality Date  ? ABDOMINAL HYSTERECTOMY    ? ANKLE SURGERY Right 1995  ? APPENDECTOMY    ? BIOPSY  01/07/2018  ? Procedure: BIOPSY;  Surgeon: [name], MD;  Location: [facility];   Service: Gastroenterology;;  ? BREAST SURGERY Bilateral   ? mastectomy  ? COLONOSCOPY WITH PROPOFOL N/A 01/07/2018  ? Procedure: COLONOSCOPY WITH PROPOFOL;  Surgeon: [name], MD;  [facility]: [facility];  Service: Gastroenterology;  Laterality: N/A;  ? ESOPHAGOGASTRODUODENOSCOPY (EGD) WITH PROPOFOL N/A 01/06/2018  ? Procedure: ESOPHAGOGASTRODUODENOSCOPY (EGD) WITH PROPOFOL;  Surgeon: [name], MD;  [facility];  Service: Gastroenterology;  Laterality: N/A;  ? HERNIA REPAIR  04-26-2010  ? MASS EXCISION Left 01/26/2013  ? Procedure: EXCISION LEFT CHEST WALL MASS AND LEFT ABDOMNAL WALL MASS;  Surgeon: Harl Bowie, MD;  Location: [facility];  Service: General;  Laterality: Left;  ? MASS EXCISION Left 09/20/2014  ? Procedure: EXCISION OF LEFT CHEST WALL MASS;  Surgeon: Coralie Keens, MD;  Location: [facility];  Service: General;  Laterality: Left;  ? TEE WITHOUT CARDIOVERSION N/A 12/08/2017  ? Procedure: TRANSESOPHAGEAL ECHOCARDIOGRAM (TEE);  Surgeon: Lelon Perla, MD;  [facility];  Service: Cardiovascular;  Laterality: N/A;  ? TOTAL HIP ARTHROPLASTY Right 01/05/2017  ? TOTAL HIP ARTHROPLASTY Right 01/05/2017  ? Procedure: TOTAL HIP ARTHROPLASTY ANTERIOR APPROACH;  Surgeon: Frederik Pear, MD;  Location: [facility];  Service: Orthopedics;  Laterality: Right;  ? ? ?Social History  ? ?Socioeconomic History  ? Marital status: Married  ?  Spouse name: Not on file  ? Number of children: Not on file  ? Years of education: Not on file  ?  Highest education level: Not on file  ?Occupational History  ? Occupation: retired Education officer, museum  ?Tobacco Use  ? Smoking status: Former  ?  Types: Cigarettes  ?  Quit date: 04/14/1972  ?  Years since quitting: 49.2  ? Smokeless tobacco: Never  ?Vaping Use  ? Vaping Use: Never used  ?Substance and Sexual Activity  ? Alcohol use: Yes  ?  Comment: occasional  ? Drug use: Yes  ? Sexual activity: Yes  ?  Birth control/protection: Surgical  ?Other Topics  Concern  ? Not on file  ?Social History Narrative  ? Not on file  ? ?Social Determinants of Health  ? ?Financial Resource Strain: Not on file  ?Food Insecurity: Not on file  ?Transportation Needs: Not on file  ?Physical Activity: Not on file  ?Stress: Not on file  ?Social Connections: Not on file  ?  ? ?FAMILY HISTORY:  ?We obtained a detailed, 4-generation family history.  Significant diagnoses are listed below: ?Family History  ?Problem Relation Age of Onset  ? Breast cancer Cousin   ?     maternal first cousin  ? Uterine cancer Cousin   ?     maternal first cousin  ? ? ? ?Tami Schmidt reports a maternal first cousin diagnosed with breast cancer at an unknown age and a second maternal first cousin diagnosed with uterine cancer at an unknown age. Tami Schmidt is unaware of previous family history of genetic testing for hereditary cancer risks.  ? ?GENETIC COUNSELING ASSESSMENT: [name] is a 88 y.o. female with a personal history of cancer which is somewhat suggestive of a hereditary predisposition to cancer given her personal history of contralateral breast cancer. We, therefore, discussed and recommended the following at today's visit.  ? ?DISCUSSION: We discussed that 5 - 10% of cancer is hereditary, with most cases of breast cancer associated with BRCA1/2.  There are other genes that can be associated with hereditary breast cancer syndromes.  We discussed that testing is beneficial for several reasons including knowing how to follow individuals after completing their treatment, identifying whether potential treatment options would be beneficial, and understanding if other family members could be at risk for cancer and allowing them to undergo genetic testing.  ? ?We reviewed the characteristics, features and inheritance patterns of hereditary cancer syndromes. We also discussed genetic testing, including the appropriate family members to test, the process of testing, insurance coverage and turn-around-time for  results. We discussed the implications of a negative, positive, carrier and/or variant of uncertain significant result. We recommended Ms. Goedde pursue genetic testing for a panel that includes genes associated with breast and uterine cancer.  ? ?Ms. Rallo elected to have [facility] CancerNext-Expanded Panel. The CancerNext-Expanded gene panel offered by [facility] and includes sequencing, rearrangement, and RNA analysis for the following 77 genes: AIP, ALK, APC, ATM, AXIN2, BAP1, BARD1, BLM, BMPR1A, BRCA1, BRCA2, BRIP1, CDC73, CDH1, CDK4, CDKN1B, CDKN2A, CHEK2, CTNNA1, DICER1, FANCC, FH, FLCN, GALNT12, KIF1B, LZTR1, MAX, MEN1, MET, MLH1, MSH2, MSH3, MSH6, MUTYH, NBN, NF1, NF2, NTHL1, PALB2, PHOX2B, PMS2, POT1, PRKAR1A, PTCH1, PTEN, RAD51C, RAD51D, RB1, RECQL, RET, SDHA, SDHAF2, SDHB, SDHC, SDHD, SMAD4, SMARCA4, SMARCB1, SMARCE1, STK11, SUFU, TMEM127, TP53, TSC1, TSC2, VHL and XRCC2 (sequencing and deletion/duplication); EGFR, EGLN1, HOXB13, KIT, MITF, PDGFRA, POLD1, and POLE (sequencing only); EPCAM and GREM1 (deletion/duplication only).  ? ?Based on Ms. Authement's personal and family history of cancer, she meets medical criteria for genetic testing. Despite that she meets criteria, she may still have an  out of pocket cost. We discussed that if her out of pocket cost for testing is over $100, the laboratory will call and confirm whether she wants to proceed with testing.  If the out of pocket cost of testing is less than $100 she will be billed by the genetic testing laboratory.  ? ?PLAN: After considering the risks, benefits, and limitations, Ms. Segel provided informed consent to pursue genetic testing and the blood sample was sent to [facility] for analysis of the CancerNext-Expanded Panel. Results should be available within approximately 2-3 weeks' time, at which point they will be disclosed by telephone to Ms. Zell, as will any additional recommendations warranted by these results. Ms. Ivanoff will  receive a summary of her genetic counseling visit and a copy of her results once available. This information will also be available in Epic.  ? ?Ms. Yonke's questions were answered to her satisfaction today

## 2021-07-17 ENCOUNTER — Inpatient Hospital Stay: Payer: Medicare Other | Attending: Adult Health

## 2021-07-17 ENCOUNTER — Other Ambulatory Visit: Payer: Self-pay

## 2021-07-17 VITALS — BP 173/71 | HR 84 | Temp 98.2°F | Resp 18

## 2021-07-17 DIAGNOSIS — C773 Secondary and unspecified malignant neoplasm of axilla and upper limb lymph nodes: Secondary | ICD-10-CM | POA: Insufficient documentation

## 2021-07-17 DIAGNOSIS — M8580 Other specified disorders of bone density and structure, unspecified site: Secondary | ICD-10-CM | POA: Insufficient documentation

## 2021-07-17 DIAGNOSIS — Z5112 Encounter for antineoplastic immunotherapy: Secondary | ICD-10-CM | POA: Diagnosis present

## 2021-07-17 DIAGNOSIS — Z5111 Encounter for antineoplastic chemotherapy: Secondary | ICD-10-CM | POA: Insufficient documentation

## 2021-07-17 DIAGNOSIS — C50312 Malignant neoplasm of lower-inner quadrant of left female breast: Secondary | ICD-10-CM | POA: Diagnosis present

## 2021-07-17 DIAGNOSIS — Z170 Estrogen receptor positive status [ER+]: Secondary | ICD-10-CM | POA: Insufficient documentation

## 2021-07-17 DIAGNOSIS — R229 Localized swelling, mass and lump, unspecified: Secondary | ICD-10-CM | POA: Diagnosis not present

## 2021-07-17 MED ORDER — FULVESTRANT 250 MG/5ML IM SOSY
500.0000 mg | PREFILLED_SYRINGE | Freq: Once | INTRAMUSCULAR | Status: AC
  Administered 2021-07-17: 500 mg via INTRAMUSCULAR
  Filled 2021-07-17: qty 10

## 2021-07-17 NOTE — Patient Instructions (Signed)
Fulvestrant injection °What is this medication? °FULVESTRANT (ful VES trant) blocks the effects of estrogen. It is used to treat breast cancer. °This medicine may be used for other purposes; ask your health care provider or pharmacist if you have questions. °COMMON BRAND NAME(S): FASLODEX °What should I tell my care team before I take this medication? °They need to know if you have any of these conditions: °bleeding disorders °liver disease °low blood counts, like low white cell, platelet, or red cell counts °an unusual or allergic reaction to fulvestrant, other medicines, foods, dyes, or preservatives °pregnant or trying to get pregnant °breast-feeding °How should I use this medication? °This medicine is for injection into a muscle. It is usually given by a health care professional in a hospital or clinic setting. °Talk to your pediatrician regarding the use of this medicine in children. Special care may be needed. °Overdosage: If you think you have taken too much of this medicine contact a poison control center or emergency room at once. °NOTE: This medicine is only for you. Do not share this medicine with others. °What if I miss a dose? °It is important not to miss your dose. Call your doctor or health care professional if you are unable to keep an appointment. °What may interact with this medication? °medicines that treat or prevent blood clots like warfarin, enoxaparin, dalteparin, apixaban, dabigatran, and rivaroxaban °This list may not describe all possible interactions. Give your health care provider a list of all the medicines, herbs, non-prescription drugs, or dietary supplements you use. Also tell them if you smoke, drink alcohol, or use illegal drugs. Some items may interact with your medicine. °What should I watch for while using this medication? °Your condition will be monitored carefully while you are receiving this medicine. You will need important blood work done while you are taking this  medicine. °Do not become pregnant while taking this medicine or for at least 1 year after stopping it. Women of child-bearing potential will need to have a negative pregnancy test before starting this medicine. Women should inform their doctor if they wish to become pregnant or think they might be pregnant. There is a potential for serious side effects to an unborn child. Men should inform their doctors if they wish to father a child. This medicine may lower sperm counts. Talk to your health care professional or pharmacist for more information. Do not breast-feed an infant while taking this medicine or for 1 year after the last dose. °What side effects may I notice from receiving this medication? °Side effects that you should report to your doctor or health care professional as soon as possible: °allergic reactions like skin rash, itching or hives, swelling of the face, lips, or tongue °feeling faint or lightheaded, falls °pain, tingling, numbness, or weakness in the legs °signs and symptoms of infection like fever or chills; cough; flu-like symptoms; sore throat °vaginal bleeding °Side effects that usually do not require medical attention (report to your doctor or health care professional if they continue or are bothersome): °aches, pains °constipation °diarrhea °headache °hot flashes °nausea, vomiting °pain at site where injected °stomach pain °This list may not describe all possible side effects. Call your doctor for medical advice about side effects. You may report side effects to FDA at 1-800-FDA-1088. °Where should I keep my medication? °This drug is given in a hospital or clinic and will not be stored at home. °NOTE: This sheet is a summary. It may not cover all possible information. If you have   questions about this medicine, talk to your doctor, pharmacist, or health care provider. °© 2022 [employer] (2017-07-14 00:00:00) ° °

## 2021-07-19 ENCOUNTER — Ambulatory Visit (HOSPITAL_COMMUNITY)
Admission: RE | Admit: 2021-07-19 | Discharge: 2021-07-19 | Disposition: A | Discharge status: Home / Self Care | Payer: Medicare Other | Source: Ambulatory Visit | Attending: Hematology and Oncology | Admitting: Hematology and Oncology

## 2021-07-19 DIAGNOSIS — C7989 Secondary malignant neoplasm of other specified sites: Secondary | ICD-10-CM | POA: Diagnosis present

## 2021-07-19 DIAGNOSIS — C50312 Malignant neoplasm of lower-inner quadrant of left female breast: Secondary | ICD-10-CM | POA: Diagnosis present

## 2021-07-19 DIAGNOSIS — C50919 Malignant neoplasm of unspecified site of unspecified female breast: Secondary | ICD-10-CM | POA: Insufficient documentation

## 2021-07-19 DIAGNOSIS — Z170 Estrogen receptor positive status [ER+]: Secondary | ICD-10-CM | POA: Diagnosis present

## 2021-07-19 MED ORDER — SODIUM CHLORIDE (PF) 0.9 % IJ SOLN
INTRAMUSCULAR | Status: AC
  Filled 2021-07-19: qty 50

## 2021-07-19 MED ORDER — IOHEXOL 300 MG/ML  SOLN
100.0000 mL | Freq: Once | INTRAMUSCULAR | Status: AC | PRN
  Administered 2021-07-19: 75 mL via INTRAVENOUS

## 2021-07-24 ENCOUNTER — Telehealth: Payer: Self-pay | Admitting: Genetic Counselor

## 2021-07-24 ENCOUNTER — Ambulatory Visit: Payer: Self-pay | Admitting: Genetic Counselor

## 2021-07-24 DIAGNOSIS — Z1379 Encounter for other screening for genetic and chromosomal anomalies: Secondary | ICD-10-CM

## 2021-07-24 NOTE — Progress Notes (Signed)
HPI:   ?Tami Schmidt was previously seen in the [facility] clinic due to a personal and family history of cancer and concerns regarding a hereditary predisposition to cancer. Please refer to our prior cancer genetics clinic note for more information regarding our discussion, assessment and recommendations, at the time. Tami Schmidt recent genetic test results were disclosed to her, as were recommendations warranted by these results. These results and recommendations are discussed in more detail below. ? ?CANCER HISTORY:  ?Oncology History  ?Malignant neoplasm of lower-inner quadrant of left breast in female, estrogen receptor positive ([place])  ?06/16/2013 Initial Diagnosis  ? Malignant neoplasm of lower-inner quadrant of left breast in female, estrogen receptor positive ([place]) ?  ?05/30/2021 -  Chemotherapy  ? Patient is on Treatment Plan : BREAST Trastuzumab q21d  ?   ? ? ?FAMILY HISTORY:  ?We obtained a detailed, 4-generation family history.  Significant diagnoses are listed below: ?     ?Family History  ?Problem Relation Age of Onset  ? Breast cancer Cousin    ?      maternal first cousin  ? Uterine cancer Cousin    ?      maternal first cousin  ?  ?  ?Tami Schmidt reports a maternal first cousin diagnosed with breast cancer at an unknown age and a second maternal first cousin diagnosed with uterine cancer at an unknown age. Tami Schmidt is unaware of previous family history of genetic testing for hereditary cancer risks.  ? ?GENETIC TEST RESULTS:  ?The [facility] CancerNext-Expanded Panel found no pathogenic mutations.  ? ?The CancerNext-Expanded gene panel offered by [facility] and includes sequencing, rearrangement, and RNA analysis for the following 77 genes: AIP, ALK, APC, ATM, AXIN2, BAP1, BARD1, BLM, BMPR1A, BRCA1, BRCA2, BRIP1, CDC73, CDH1, CDK4, CDKN1B, CDKN2A, CHEK2, CTNNA1, DICER1, FANCC, FH, FLCN, GALNT12, KIF1B, LZTR1, MAX, MEN1, MET, MLH1, MSH2, MSH3, MSH6, MUTYH, NBN, NF1, NF2, NTHL1, PALB2,  PHOX2B, PMS2, POT1, PRKAR1A, PTCH1, PTEN, RAD51C, RAD51D, RB1, RECQL, RET, SDHA, SDHAF2, SDHB, SDHC, SDHD, SMAD4, SMARCA4, SMARCB1, SMARCE1, STK11, SUFU, TMEM127, TP53, TSC1, TSC2, VHL and XRCC2 (sequencing and deletion/duplication); EGFR, EGLN1, HOXB13, KIT, MITF, PDGFRA, POLD1, and POLE (sequencing only); EPCAM and GREM1 (deletion/duplication only).  ? ?The test report has been scanned into EPIC and is located under the Molecular Pathology section of the Results Review tab.  A portion of the result report is included below for reference. Genetic testing reported out on 07/22/2021.  ? ? ? ? ? 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  At this time, it is unknown if this variant is associated with an increased risk for cancer or if it is benign, but most uncertain variants are reclassified to benign. It should not be used to make medical management decisions. With time, we suspect the laboratory will determine the significance of this variant, if any. If the laboratory reclassifies this variant, we will attempt to contact Tami Schmidt to discuss it further.  ? ?Even though a pathogenic variant was not identified, possible explanations for her personal history of cancer may include: ?There may be no hereditary risk for cancer in the family. The cancers in Tami Schmidt and/or her family may be due to other genetic or environmental factors. ?There may be a gene mutation in one of these genes that current testing methods cannot detect, but that chance is small. ?There could be another gene that has not yet been discovered, or that we have  not yet tested, that is responsible for the cancer diagnoses in the family.  ? ?Therefore, it is important to remain in touch with cancer genetics in the future so that we can continue to offer Tami Schmidt the most up to date genetic testing.  ? ?ADDITIONAL GENETIC TESTING:  ?We discussed with Tami Schmidt that her genetic testing  was fairly extensive.  If there are genes identified to increase cancer risk that can be analyzed in the future, we would be happy to discuss and coordinate this testing at that time.   ? ?CANCER SCREENING RECOMMENDATIONS:  ?Tami Schmidt test result is considered negative (normal).  This means that we have not identified a hereditary cause for her personal and family history of cancer at this time. Most cancers happen by chance and this negative test suggests that her cancer may fall into this category.   ? ?An individual's cancer risk and medical management are not determined by genetic test results alone. Overall cancer risk assessment incorporates additional factors, including personal medical history, family history, and any available genetic information that may result in a personalized plan for cancer prevention and surveillance. Therefore, it is recommended she continue to follow the cancer management and screening guidelines provided by her oncology and primary healthcare provider. ? ?RECOMMENDATIONS FOR FAMILY MEMBERS:   ?Since she did not inherit a mutation in a cancer predisposition gene included on this panel, her children could not have inherited a mutation from her in one of these genes. ?Individuals in this family might be at some increased risk of developing cancer, over the general population risk, due to the family history of cancer. We recommend women in this family have a yearly mammogram beginning at age 40, or 38 years younger than the earliest onset of cancer, an annual clinical breast exam, and perform monthly breast self-exams.  ?We do not recommend familial testing for the MSH3 variant of uncertain significance (VUS). ? ?FOLLOW-UP:  ?Cancer genetics is a rapidly advancing field and it is possible that new genetic tests will be appropriate for her and/or her family members in the future. We encouraged her to remain in contact with cancer genetics on an annual basis so we can update her  personal and family histories and let her know of advances in cancer genetics that may benefit this family.  ? ?Our contact number was provided. Tami Schmidt questions were answered to her satisfaction, and she knows she is welcome to call us at anytime with additional questions or concerns.  ? ?[name], MS, [place] ?Genetic Counselor ?[name] ?(P) [phone] ? ? ?

## 2021-07-24 NOTE — Telephone Encounter (Signed)
I attempted to contact Ms. Rowles to discuss her genetic testing results (77 genes). I left a voicemail requesting she call me back at [phone]. ? ?[name], MS, LCGC ?Genetic Counselor ?[name] ?(P) [phone] ? ?

## 2021-07-25 ENCOUNTER — Inpatient Hospital Stay (HOSPITAL_BASED_OUTPATIENT_CLINIC_OR_DEPARTMENT_OTHER): Payer: Medicare Other | Admitting: Hematology and Oncology

## 2021-07-25 ENCOUNTER — Telehealth: Payer: Self-pay | Admitting: Genetic Counselor

## 2021-07-25 VITALS — BP 161/66 | HR 101 | Temp 97.2°F | Resp 18 | Ht 67.0 in | Wt 207.3 lb

## 2021-07-25 DIAGNOSIS — Z170 Estrogen receptor positive status [ER+]: Secondary | ICD-10-CM

## 2021-07-25 DIAGNOSIS — C50312 Malignant neoplasm of lower-inner quadrant of left female breast: Secondary | ICD-10-CM | POA: Diagnosis not present

## 2021-07-25 DIAGNOSIS — Z5112 Encounter for antineoplastic immunotherapy: Secondary | ICD-10-CM | POA: Diagnosis not present

## 2021-07-25 MED ORDER — APIXABAN 5 MG PO TABS: 5.0000 mg | ORAL_TABLET | Freq: Two times a day (BID) | ORAL | 3 refills | Status: DC

## 2021-07-25 NOTE — Progress Notes (Signed)
[facility] Follow up: ?  ? ?Tami Han, MD ?[address] ?[place] [address] ? ? ?DIAGNOSIS:  Cancer Staging  ?Chest wall recurrence of breast cancer ([place]) ?Staging form: Breast, AJCC 7th Edition ?- Clinical: Stage Unknown (TX, N1, M0) - Signed by Chauncey Cruel, MD on 07/27/2014 ? ?Malignant neoplasm of lower-inner quadrant of left breast in female, estrogen receptor positive ([place]) ?Staging form: Breast, AJCC 7th Edition ?- Clinical: Stage IA (T1b, N0, M0) - Signed by Chauncey Cruel, MD on 07/27/2014 ? ? ?SUMMARY OF ONCOLOGIC HISTORY: ? ?88 y.o. [place] woman ?  ?(1) status post right lumpectomy and axillary dissection in 1993 in [place] for what appears to have been a stage I breast cancer, treated with radiation and then tamoxifen for 5 years ?  ?(2) status post right mastectomy 2002 for a recurrence in the right breast, treated adjuvantly with tamoxifen for an additional 5 years ?  ?(3) status post left mastectomy and sentinel lymph node sampling 03/07/2003 for a lower inner quadrant pT1b, pN0, stage IA mucinous breast cancer, grade 2; no radiation therapy was given. Tamoxifen was continued until 2009 ?  ?RECURRENT DISEASE/ LEFT: OCTOBER 2014 ?(4) excisional biopsy of a left chest wall mass 01/26/2013 showing carcinoma consistent with invasive ductal carcinoma, estrogen receptor 100% positive, progesterone receptor 0% positive, with an MIB-1 of 22% and no HER-2 amplification.  ?  ?(5)  PET scan on 03/03/2013 confirmed extensive metastatic adenopathy in the left axilla, left subpectoral musculature, and left supraclavicular region ?  ?(6)  started on anastrozole daily beginning 03/01/2013, interrupted during radiation therapy, resumed March 2015, discontinued March 2016 because of fatigue and arthralgias ?  ?(7) radiation therapy 03/31/2013 through 05/23/2013 ?Site/dose:   The patient was treated initially with a forward treatment planning technique to the  left chest wall in addition to treatment to the supraclavicular region. This consisted of a 3-D conformal technique. The patient was treated in this fashion to a dose of 50.4 gray. The patient then received a 14 gray boost treatment using an electron field. The total dose was 64.4 gray. ?  ?(8) Osteopenia, zometa yearly started 07/27/2014, but poorly tolerated.  ?            (a) bone density scan on 12/27/13 showed a t-score of -1.8  ?            (b) started Denosumab/Prolia April 2017, canceled after one dose per patient ?  ?(9) tamoxifen started 07/27/2014, stopped November 2019 (had total 5 years of antiestrogens) ?  ?(10) likely thalassemia trait ?  ?(11) left subpectoral soft tissue nodule noted 07/17/2015 unchanged through serial scans, most recent 71/69/6789, with no metabolic activity noted in PET scan of 2016 and interval calcification.  This is presumed benign ?  ?(12)  ADDITIONAL REGIONAL RECURRENCE: ?            (A) CT of the chest with contrast 03/05/2021 obtained to evaluate left upper extremity lymphedema as showed acute pulmonary emboli.  There was also left axillary and subpectoral lymphadenopathy ?            (B) rivaroxaban started 03/05/2021 ?            (C) left breast ultrasound 03/29/2021 shows 2 areas in the infraclavicular tissue consistent with fat necrosis.  There were however multiple retropectoral interpectoral and left axillary masses. ?            (D) left axillary lymph node biopsy 04/04/2021:  IDC grade 3, ER positive, 100%, strong staining intensity, PR positive, 20%, weak staining intensity, Ki-67 of 15%, HER2 positive by IHC, ratio 2.90 ?            (E) Fulvestrant every 4 weeks beginning 04/18/2021 ?            (F) Herceptin every 3 weeks beginning 05/30/2021 ?  ? ?CURRENT THERAPY: Trastuzumab & Fulvestrant ? ?INTERVAL HISTORY: ? ?Tami Schmidt 88 y.o. female returns for follow-up of her estrogen positive progesterone positive and HER2 positive breast cancer recurrence diagnosed in  November 2022.   ? ?She is receiving fulvestrant every 4 weeks and Herceptin every 3 weeks.  Her most recent echocardiogram was completed on May 06, 2021, this showed a normal ejection fraction of 60 to 65%. ? ?Patient presented here for follow-up today.  She tells me that she does not feel well overall, generalized fatigue.  She has been noticing more achiness in her joints.  She says skin nodules are not growing.  She denies any changes in breathing or bowel habits or urinary habits. No chest pain, chest pressure or SOB. ?Rest of the pertinent 10 point ROS reviewed and negative. ? ? ?Patient Active Problem List  ? Diagnosis Date Noted  ? Genetic testing 07/24/2021  ? Microcytosis 04/29/2021  ? Hypokalemia 04/28/2021  ? SBO (small bowel obstruction) ([place]) 04/27/2021  ? Pulmonary embolism ([place])   ? 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 Hardening of the aorta (main artery of the heart) ([place]) 08/09/2020  ? Morbid obesity ([place]) 08/09/2020  ? Neuropathy 08/09/2020  ? Primary osteoarthritis 08/09/2020  ? Sensorineural hearing loss (SNHL) of both ears 08/07/2020  ? Anosmia 06/05/2020  ? Tinnitus, bilateral 06/05/2020  ? GI bleed 07/13/2019  ? Acute blood loss anemia 07/12/2019  ? Microcytic anemia 01/05/2018  ? Melena 01/04/2018  ? Lower GI bleed 01/04/2018  ? Type 2 diabetes mellitus without complication ([place]) 84/69/6295  ? Bacteremia due to methicillin susceptible Staphylococcus aureus (MSSA) 12/02/2017  ? Tenosynovitis of left wrist 12/02/2017  ? Infection of left wrist ([place]) 11/30/2017  ? Primary osteoarthritis of right hip 01/05/2017  ? Osteoarthritis of right hip 01/03/2017  ? Lymphedema of upper extremity 01/17/2014  ? Osteopenia 01/17/2014  ? 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  ? 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  ? Dilated pore of [place] 08/02/2013  ? 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 Scar 08/02/2013  ? Malignant neoplasm of lower-inner quadrant of left breast in female, estrogen receptor positive ([place])  06/16/2013  ? Type II or unspecified type diabetes mellitus without mention of complication, not stated as uncontrolled 03/25/2013  ? Essential hypertension 03/25/2013  ? Chest wall recurrence of breast cancer ([place]) 02/21/2013  ? Abdominal wall mass 01/14/2013  ? ? ?is allergic to bactrim, lisinopril, vasotec, and sulfamethoxazole-trimethoprim. ? ?MEDICAL HISTORY: ?Past Medical History:  ?Diagnosis Date  ? Arthritis   ? Breast cancer ([place])   ? b/l mastectomies hx  ? Cancer [facility])   ? breast  ? 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 Diabetes mellitus   ? fasting 90-100  ? HTN (hypertension) 03/25/2013  ? Hx of radiation therapy   ? breasts hx  ? Hypercholesterolemia   ? Hypertension   ? Lymphedema of arm   ? left arm  ? Pulmonary embolism ([place])   ? Type II or unspecified type diabetes mellitus without mention of complication, not stated as uncontrolled 03/25/2013  ? ? ?SURGICAL HISTORY: ?Past Surgical History:  ?Procedure Laterality Date  ?  ABDOMINAL HYSTERECTOMY    ? ANKLE SURGERY Right 1995  ? APPENDECTOMY    ? BIOPSY  01/07/2018  ? Procedure: BIOPSY;  Surgeon: [name], MD;  Location: [facility];  Service: Gastroenterology;;  ? BREAST SURGERY Bilateral   ? mastectomy  ? COLONOSCOPY WITH PROPOFOL N/A 01/07/2018  ? Procedure: COLONOSCOPY WITH PROPOFOL;  Surgeon: [name], MD;  Location: [facility];  Service: Gastroenterology;  Laterality: N/A;  ? ESOPHAGOGASTRODUODENOSCOPY (EGD) WITH PROPOFOL N/A 01/06/2018  ? Procedure: ESOPHAGOGASTRODUODENOSCOPY (EGD) WITH PROPOFOL;  Surgeon: [name], MD;  Location: [facility];  Service: Gastroenterology;  Laterality: N/A;  ? HERNIA REPAIR  04-26-2010  ? MASS EXCISION Left 01/26/2013  ? Procedure: EXCISION LEFT CHEST WALL MASS AND LEFT ABDOMNAL WALL MASS;  Surgeon: Harl Bowie, MD;  Location: [facility];  Service: General;  Laterality: Left;  ? MASS EXCISION Left 09/20/2014  ? Procedure: EXCISION OF LEFT CHEST WALL MASS;  Surgeon: Coralie Keens, MD;   Location: [facility];  Service: General;  Laterality: Left;  ? TEE WITHOUT CARDIOVERSION N/A 12/08/2017  ? Procedure: TRANSESOPHAGEAL ECHOCARDIOGRAM (TEE);  Surgeon: Lelon Perla, MD;

## 2021-07-25 NOTE — Telephone Encounter (Signed)
I attempted to contact [name] to discuss her genetic testing results (77 genes). I was unable to leave a voicemail and will attempt to contact her again at a later date.  ? ?[name], MS, [place] ?Genetic Counselor ?[name] ?(P) [phone] ? ? ?

## 2021-07-26 ENCOUNTER — Encounter: Payer: Self-pay | Admitting: Hematology and Oncology

## 2021-07-26 ENCOUNTER — Encounter: Payer: Self-pay | Admitting: Oncology

## 2021-07-30 ENCOUNTER — Other Ambulatory Visit: Payer: Self-pay

## 2021-07-30 DIAGNOSIS — Z170 Estrogen receptor positive status [ER+]: Secondary | ICD-10-CM

## 2021-07-31 ENCOUNTER — Ambulatory Visit: Payer: Medicare Other

## 2021-08-01 ENCOUNTER — Inpatient Hospital Stay: Payer: Medicare Other | Admitting: Hematology and Oncology

## 2021-08-01 ENCOUNTER — Other Ambulatory Visit: Payer: Self-pay | Admitting: Hematology and Oncology

## 2021-08-01 ENCOUNTER — Inpatient Hospital Stay: Payer: Medicare Other

## 2021-08-01 ENCOUNTER — Encounter: Payer: Self-pay | Admitting: Hematology and Oncology

## 2021-08-01 ENCOUNTER — Other Ambulatory Visit: Payer: Self-pay

## 2021-08-01 VITALS — BP 175/70 | HR 71 | Temp 97.5°F | Resp 16 | Wt 206.7 lb

## 2021-08-01 DIAGNOSIS — Z170 Estrogen receptor positive status [ER+]: Secondary | ICD-10-CM

## 2021-08-01 DIAGNOSIS — C7989 Secondary malignant neoplasm of other specified sites: Secondary | ICD-10-CM

## 2021-08-01 DIAGNOSIS — C50919 Malignant neoplasm of unspecified site of unspecified female breast: Secondary | ICD-10-CM

## 2021-08-01 DIAGNOSIS — C50312 Malignant neoplasm of lower-inner quadrant of left female breast: Secondary | ICD-10-CM | POA: Diagnosis not present

## 2021-08-01 DIAGNOSIS — Z5112 Encounter for antineoplastic immunotherapy: Secondary | ICD-10-CM | POA: Diagnosis not present

## 2021-08-01 LAB — CMP (CANCER CENTER ONLY)
ALT: 9 U/L (ref 0–44)
AST: 11 U/L — ABNORMAL LOW (ref 15–41)
Albumin: 4.1 g/dL (ref 3.5–5.0)
Alkaline Phosphatase: 64 U/L (ref 38–126)
Anion gap: 6 (ref 5–15)
BUN: 17 mg/dL (ref 8–23)
CO2: 29 mmol/L (ref 22–32)
Calcium: 9.4 mg/dL (ref 8.9–10.3)
Chloride: 103 mmol/L (ref 98–111)
Creatinine: 0.77 mg/dL (ref 0.44–1.00)
GFR, Estimated: >60 mL/min (ref >60)
Glucose, Bld: 140 mg/dL — ABNORMAL HIGH (ref 70–99)
Potassium: 4 mmol/L (ref 3.5–5.1)
Sodium: 138 mmol/L (ref 135–145)
Total Bilirubin: 0.4 mg/dL (ref 0.3–1.2)
Total Protein: 7.4 g/dL (ref 6.5–8.1)

## 2021-08-01 LAB — CBC WITH DIFFERENTIAL (CANCER CENTER ONLY)
Abs Immature Granulocytes: 0.01 10*3/uL (ref 0.00–0.07)
Basophils Absolute: 0 10*3/uL (ref 0.0–0.1)
Basophils Relative: 1 %
Eosinophils Absolute: 0.2 10*3/uL (ref 0.0–0.5)
Eosinophils Relative: 4 %
HCT: 38.5 % (ref 36.0–46.0)
Hemoglobin: 12.3 g/dL (ref 12.0–15.0)
Immature Granulocytes: 0 %
Lymphocytes Relative: 28 %
Lymphs Abs: 1.5 10*3/uL (ref 0.7–4.0)
MCH: 25.4 pg — ABNORMAL LOW (ref 26.0–34.0)
MCHC: 31.9 g/dL (ref 30.0–36.0)
MCV: 79.5 fL — ABNORMAL LOW (ref 80.0–100.0)
Monocytes Absolute: 0.3 10*3/uL (ref 0.1–1.0)
Monocytes Relative: 6 %
Neutro Abs: 3.2 10*3/uL (ref 1.7–7.7)
Neutrophils Relative %: 61 %
Platelet Count: 221 10*3/uL (ref 150–400)
RBC: 4.84 MIL/uL (ref 3.87–5.11)
RDW: 15.3 % (ref 11.5–15.5)
WBC Count: 5.3 10*3/uL (ref 4.0–10.5)
nRBC: 0 % (ref 0.0–0.2)

## 2021-08-01 MED ORDER — TRASTUZUMAB-ANNS CHEMO 150 MG IV SOLR
6.0000 mg/kg | Freq: Once | INTRAVENOUS | Status: AC
  Administered 2021-08-01: 588 mg via INTRAVENOUS
  Filled 2021-08-01: qty 28

## 2021-08-01 MED ORDER — ACETAMINOPHEN 325 MG PO TABS
650.0000 mg | ORAL_TABLET | Freq: Once | ORAL | Status: AC
  Administered 2021-08-01: 650 mg via ORAL
  Filled 2021-08-01: qty 2

## 2021-08-01 MED ORDER — SODIUM CHLORIDE 0.9 % IV SOLN: Freq: Once | INTRAVENOUS | Status: AC

## 2021-08-01 MED ORDER — APIXABAN 5 MG PO TABS: 5.0000 mg | ORAL_TABLET | Freq: Two times a day (BID) | ORAL | 3 refills | Status: DC

## 2021-08-01 MED ORDER — DIPHENHYDRAMINE HCL 25 MG PO CAPS
50.0000 mg | ORAL_CAPSULE | Freq: Once | ORAL | Status: AC
  Administered 2021-08-01: 50 mg via ORAL
  Filled 2021-08-01: qty 2

## 2021-08-01 NOTE — Patient Instructions (Signed)
[facility] MEDICAL ONCOLOGY  Discharge Instructions: ?Thank you for choosing [facility] to provide your oncology and hematology care.  ? ?If you have a lab appointment with the Cancer Center, please go directly to the Cancer Center and check in at the registration area. ?  ?Wear comfortable clothing and clothing appropriate for easy access to any Portacath or PICC line.  ? ?We strive to give you quality time with your provider. You may need to reschedule your appointment if you arrive late (15 or more minutes).  Arriving late affects you and other patients whose appointments are after yours.  Also, if you miss three or more appointments without notifying the office, you may be dismissed from the clinic at the provider?s discretion.    ?  ?For prescription refill requests, have your pharmacy contact our office and allow 72 hours for refills to be completed.   ? ?Today you received the following chemotherapy and/or immunotherapy agents: Trastuzumab    ?  ?To help prevent nausea and vomiting after your treatment, we encourage you to take your nausea medication as directed. ? ?BELOW ARE SYMPTOMS THAT SHOULD BE REPORTED IMMEDIATELY: ?*FEVER GREATER THAN 100.4 F (38 ?C) OR HIGHER ?*CHILLS OR SWEATING ?*NAUSEA AND VOMITING THAT IS NOT CONTROLLED WITH YOUR NAUSEA MEDICATION ?*UNUSUAL SHORTNESS OF BREATH ?*UNUSUAL BRUISING OR BLEEDING ?*URINARY PROBLEMS (pain or burning when urinating, or frequent urination) ?*BOWEL PROBLEMS (unusual diarrhea, constipation, pain near the anus) ?TENDERNESS IN MOUTH AND THROAT WITH OR WITHOUT PRESENCE OF ULCERS (sore throat, sores in mouth, or a toothache) ?UNUSUAL RASH, SWELLING OR PAIN  ?UNUSUAL VAGINAL DISCHARGE OR ITCHING  ? ?Items with * indicate a potential emergency and should be followed up as soon as possible or go to the Emergency Department if any problems should occur. ? ?Please show the CHEMOTHERAPY ALERT CARD or IMMUNOTHERAPY ALERT CARD at check-in  to the Emergency Department and triage nurse. ? ?Should you have questions after your visit or need to cancel or reschedule your appointment, please contact [facility]  Dept: [phone]  and follow the prompts.  Office hours are 8:00 a.m. to 4:30 p.m. Monday - Friday. Please note that voicemails left after 4:00 p.m. may not be returned until the following business day.  We are closed weekends and major holidays. You have access to a nurse at all times for urgent questions. Please call the main number to the clinic Dept: [phone] and follow the prompts. ? ? ?For any non-urgent questions, you may also contact your provider using MyChart. We now offer e-Visits for anyone 18 and older to request care online for non-urgent symptoms. For details visit [email or web address]. ?  ?Also download the MyChart app! Go to the app store, search "MyChart", open the app, select , and log in with your MyChart username and password. ? ?Due to Covid, a mask is required upon entering the hospital/clinic. If you do not have a mask, one will be given to you upon arrival. For doctor visits, patients may have 1 support person aged 18 or older with them. For treatment visits, patients cannot have anyone with them due to current Covid guidelines and our immunocompromised population.  ? ?

## 2021-08-01 NOTE — Progress Notes (Signed)
[facility] up: ?  ? ?Tami Han, MD ?[address] ?[place] [address] ? ? ?DIAGNOSIS:  Cancer Staging  ?Chest wall recurrence of breast cancer ([place]) ?Staging form: Breast, AJCC 7th Edition ?- Clinical: Stage Unknown (TX, N1, M0) - Signed by Chauncey Cruel, MD on 07/27/2014 ? ?Malignant neoplasm of lower-inner quadrant of left breast in female, estrogen receptor positive ([place]) ?Staging form: Breast, AJCC 7th Edition ?- Clinical: Stage IA (T1b, N0, M0) - Signed by Chauncey Cruel, MD on 07/27/2014 ? ? ?SUMMARY OF ONCOLOGIC HISTORY: ? ?88 y.o. Tami Schmidt woman ?  ?(1) status post right lumpectomy and axillary dissection in 1993 in [place] for what appears to have been a stage I breast cancer, treated with radiation and then tamoxifen for 5 years ?  ?(2) status post right mastectomy 2002 for a recurrence in the right breast, treated adjuvantly with tamoxifen for an additional 5 years ?  ?(3) status post left mastectomy and sentinel lymph node sampling 03/07/2003 for a lower inner quadrant pT1b, pN0, stage IA mucinous breast cancer, grade 2; no radiation therapy was given. Tamoxifen was continued until 2009 ?  ?RECURRENT DISEASE/ LEFT: OCTOBER 2014 ?(4) excisional biopsy of a left chest wall mass 01/26/2013 showing carcinoma consistent with invasive ductal carcinoma, estrogen receptor 100% positive, progesterone receptor 0% positive, with an MIB-1 of 22% and no HER-2 amplification.  ?  ?(5)  PET scan on 03/03/2013 confirmed extensive metastatic adenopathy in the left axilla, left subpectoral musculature, and left supraclavicular region ?  ?(6)  started on anastrozole daily beginning 03/01/2013, interrupted during radiation therapy, resumed March 2015, discontinued March 2016 because of fatigue and arthralgias ?  ?(7) radiation therapy 03/31/2013 through 05/23/2013 ?Site/dose:   The patient was treated initially with a forward treatment planning technique to the  left chest wall in addition to treatment to the supraclavicular region. This consisted of a 3-D conformal technique. The patient was treated in this fashion to a dose of 50.4 gray. The patient then received a 14 gray boost treatment using an electron field. The total dose was 64.4 gray. ?  ?(8) Osteopenia, zometa yearly started 07/27/2014, but poorly tolerated.  ?            (a) bone density scan on 12/27/13 showed a t-score of -1.8  ?            (b) started Denosumab/Prolia April 2017, canceled after one dose per patient ?  ?(9) tamoxifen started 07/27/2014, stopped November 2019 (had total 5 years of antiestrogens) ?  ?(10) likely thalassemia trait ?  ?(11) left subpectoral soft tissue nodule noted 07/17/2015 unchanged through serial scans, most recent 71/69/6789, with no metabolic activity noted in PET scan of 2016 and interval calcification.  This is presumed benign ?  ?(12)  ADDITIONAL REGIONAL RECURRENCE: ?            (A) CT of the chest with contrast 03/05/2021 obtained to evaluate left upper extremity lymphedema as showed acute pulmonary emboli.  There was also left axillary and subpectoral lymphadenopathy ?            (B) rivaroxaban started 03/05/2021 ?            (C) left breast ultrasound 03/29/2021 shows 2 areas in the infraclavicular tissue consistent with fat necrosis.  There were however multiple retropectoral interpectoral and left axillary masses. ?            (D) left axillary lymph node biopsy 04/04/2021:  IDC grade 3, ER positive, 100%, strong staining intensity, PR positive, 20%, weak staining intensity, Ki-67 of 15%, HER2 positive by IHC, ratio 2.90 ?            (E) Fulvestrant every 4 weeks beginning 04/18/2021 ?            (F) Herceptin every 3 weeks beginning 05/30/2021 ?  ? ?CURRENT THERAPY: Trastuzumab & Fulvestrant ? ?INTERVAL HISTORY: ? ?Tami Schmidt 88 y.o. female returns for follow-up of her estrogen positive progesterone positive and HER2 positive breast cancer recurrence diagnosed in  November 2022.   ? ?She is receiving fulvestrant every 4 weeks and Herceptin every 3 weeks.   ?She has a follow-up with Dr. Ninfa Schmidt first week of May.  She is due for her Herceptin today.  She complains of fatigue otherwise no complaints.  She does not think that the skin nodules have changed any.  Rest of the pertinent 10 point ROS reviewed and negative ? ? ?Patient Active Problem List  ? Diagnosis Date Noted  ? Genetic testing 07/24/2021  ? Microcytosis 04/29/2021  ? Hypokalemia 04/28/2021  ? SBO (small bowel obstruction) ([place]) 04/27/2021  ? Pulmonary embolism ([place])   ? 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 Hardening of the aorta (main artery of the heart) ([place]) 08/09/2020  ? Morbid obesity ([place]) 08/09/2020  ? Neuropathy 08/09/2020  ? Primary osteoarthritis 08/09/2020  ? Sensorineural hearing loss (SNHL) of both ears 08/07/2020  ? Anosmia 06/05/2020  ? Tinnitus, bilateral 06/05/2020  ? GI bleed 07/13/2019  ? Acute blood loss anemia 07/12/2019  ? Microcytic anemia 01/05/2018  ? Melena 01/04/2018  ? Lower GI bleed 01/04/2018  ? Type 2 diabetes mellitus without complication ([place]) 71/69/6789  ? Bacteremia due to methicillin susceptible Staphylococcus aureus (MSSA) 12/02/2017  ? Tenosynovitis of left wrist 12/02/2017  ? Infection of left wrist ([place]) 11/30/2017  ? Primary osteoarthritis of right hip 01/05/2017  ? Osteoarthritis of right hip 01/03/2017  ? Lymphedema of upper extremity 01/17/2014  ? Osteopenia 01/17/2014  ? 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  ? 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  ? Dilated pore of Winer 08/02/2013  ? 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 Scar 08/02/2013  ? Malignant neoplasm of lower-inner quadrant of left breast in female, estrogen receptor positive ([place]) 06/16/2013  ? Type II or unspecified type diabetes mellitus without mention of complication, not stated as uncontrolled 03/25/2013  ? Essential hypertension 03/25/2013  ? Chest wall recurrence of breast cancer ([place]) 02/21/2013  ?  Abdominal wall mass 01/14/2013  ? ? ?is allergic to bactrim, lisinopril, vasotec, and sulfamethoxazole-trimethoprim. ? ?MEDICAL HISTORY: ?Past Medical History:  ?Diagnosis Date  ? Arthritis   ? Breast cancer ([place])   ? b/l mastectomies hx  ? Cancer [facility])   ? breast  ? 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 Diabetes mellitus   ? fasting 90-100  ? HTN (hypertension) 03/25/2013  ? Hx of radiation therapy   ? breasts hx  ? Hypercholesterolemia   ? Hypertension   ? Lymphedema of arm   ? left arm  ? Pulmonary embolism ([place])   ? Type II or unspecified type diabetes mellitus without mention of complication, not stated as uncontrolled 03/25/2013  ? ? ?SURGICAL HISTORY: ?Past Surgical History:  ?Procedure Laterality Date  ? ABDOMINAL HYSTERECTOMY    ? ANKLE SURGERY Right 1995  ? APPENDECTOMY    ? BIOPSY  01/07/2018  ? Procedure: BIOPSY;  Surgeon: [name], MD;  Location: [facility];  Service:  Gastroenterology;;  ? BREAST SURGERY Bilateral   ? mastectomy  ? COLONOSCOPY WITH PROPOFOL N/A 01/07/2018  ? Procedure: COLONOSCOPY WITH PROPOFOL;  Surgeon: [name], MD;  Location: [facility];  Service: Gastroenterology;  Laterality: N/A;  ? ESOPHAGOGASTRODUODENOSCOPY (EGD) WITH PROPOFOL N/A 01/06/2018  ? Procedure: ESOPHAGOGASTRODUODENOSCOPY (EGD) WITH PROPOFOL;  Surgeon: [name], MD;  [facility]: [facility];  Service: Gastroenterology;  Laterality: N/A;  ? HERNIA REPAIR  04-26-2010  ? MASS EXCISION Left 01/26/2013  ? Procedure: EXCISION LEFT CHEST WALL MASS AND LEFT ABDOMNAL WALL MASS;  Surgeon: Harl Bowie, MD;  Location: [facility];  Service: General;  Laterality: Left;  ? MASS EXCISION Left 09/20/2014  ? Procedure: EXCISION OF LEFT CHEST WALL MASS;  Surgeon: Coralie Keens, MD;  Location: [facility];  Service: General;  Laterality: Left;  ? TEE WITHOUT CARDIOVERSION N/A 12/08/2017  ? Procedure: TRANSESOPHAGEAL ECHOCARDIOGRAM (TEE);  Surgeon: Lelon Perla, MD;  Location: [facility];   Service: Cardiovascular;  Laterality: N/A;  ? TOTAL HIP ARTHROPLASTY Right 01/05/2017  ? TOTAL HIP ARTHROPLASTY Right 01/05/2017  ? Procedure: TOTAL HIP ARTHROPLASTY ANTERIOR APPROACH;  Surgeon: Frederik Pear,

## 2021-08-02 LAB — CANCER ANTIGEN 27.29: CA 27.29: 57.1 U/mL — ABNORMAL HIGH (ref 0.0–38.6)

## 2021-08-05 ENCOUNTER — Encounter: Payer: Self-pay | Admitting: Oncology

## 2021-08-05 ENCOUNTER — Encounter: Payer: Self-pay | Admitting: Hematology and Oncology

## 2021-08-05 ENCOUNTER — Telehealth: Payer: Self-pay | Admitting: Genetic Counselor

## 2021-08-05 NOTE — Telephone Encounter (Signed)
I contacted Tami Schmidt to discuss her genetic testing results. No pathogenic variants were identified in the 77 genes analyzed. 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 Detailed clinic note to follow. ? ?The test report has been scanned into EPIC and is located under the Molecular Pathology section of the Results Review tab.  A portion of the result report is included below for reference.  ? ?[name], MS, [place] ?Genetic Counselor ?[name] ?(P) [phone] ? ? ? ?

## 2021-08-07 ENCOUNTER — Other Ambulatory Visit: Payer: Self-pay | Admitting: *Deleted

## 2021-08-07 MED ORDER — APIXABAN 5 MG PO TABS
5.0000 mg | ORAL_TABLET | Freq: Two times a day (BID) | ORAL | 3 refills | Status: DC
Start: 2021-08-07 — End: 2022-10-07

## 2021-08-09 ENCOUNTER — Encounter: Payer: Self-pay | Admitting: Genetic Counselor

## 2021-08-09 ENCOUNTER — Ambulatory Visit (HOSPITAL_COMMUNITY)
Admission: RE | Admit: 2021-08-09 | Discharge: 2021-08-09 | Disposition: A | Discharge status: Home / Self Care | Payer: Medicare Other | Source: Ambulatory Visit | Attending: Hematology and Oncology | Admitting: Hematology and Oncology

## 2021-08-09 DIAGNOSIS — E119 Type 2 diabetes mellitus without complications: Secondary | ICD-10-CM | POA: Insufficient documentation

## 2021-08-09 DIAGNOSIS — Z0189 Encounter for other specified special examinations: Secondary | ICD-10-CM | POA: Diagnosis not present

## 2021-08-09 DIAGNOSIS — I081 Rheumatic disorders of both mitral and tricuspid valves: Secondary | ICD-10-CM | POA: Diagnosis not present

## 2021-08-09 DIAGNOSIS — Z5111 Encounter for antineoplastic chemotherapy: Secondary | ICD-10-CM | POA: Diagnosis not present

## 2021-08-09 DIAGNOSIS — C50312 Malignant neoplasm of lower-inner quadrant of left female breast: Secondary | ICD-10-CM | POA: Diagnosis present

## 2021-08-09 DIAGNOSIS — Z170 Estrogen receptor positive status [ER+]: Secondary | ICD-10-CM | POA: Insufficient documentation

## 2021-08-09 DIAGNOSIS — I119 Hypertensive heart disease without heart failure: Secondary | ICD-10-CM | POA: Insufficient documentation

## 2021-08-09 LAB — ECHOCARDIOGRAM COMPLETE
AR max vel: 2.94 cm2
AV Peak grad: 7.6 mmHg
Ao pk vel: 1.38 m/s
Area-P 1/2: 3.77 cm2
S' Lateral: 2.6 cm

## 2021-08-13 ENCOUNTER — Other Ambulatory Visit: Payer: Self-pay | Admitting: Surgery

## 2021-08-22 ENCOUNTER — Inpatient Hospital Stay: Payer: Medicare Other | Attending: Adult Health | Admitting: Hematology and Oncology

## 2021-08-22 ENCOUNTER — Other Ambulatory Visit: Payer: Self-pay | Admitting: *Deleted

## 2021-08-22 ENCOUNTER — Other Ambulatory Visit: Payer: Self-pay

## 2021-08-22 ENCOUNTER — Inpatient Hospital Stay: Payer: Medicare Other

## 2021-08-22 ENCOUNTER — Encounter: Payer: Self-pay | Admitting: Hematology and Oncology

## 2021-08-22 VITALS — BP 157/52 | HR 74 | Temp 97.0°F | Resp 18 | Ht 67.0 in | Wt 206.6 lb

## 2021-08-22 DIAGNOSIS — Z170 Estrogen receptor positive status [ER+]: Secondary | ICD-10-CM

## 2021-08-22 DIAGNOSIS — M8580 Other specified disorders of bone density and structure, unspecified site: Secondary | ICD-10-CM | POA: Diagnosis not present

## 2021-08-22 DIAGNOSIS — R590 Localized enlarged lymph nodes: Secondary | ICD-10-CM | POA: Insufficient documentation

## 2021-08-22 DIAGNOSIS — Z5112 Encounter for antineoplastic immunotherapy: Secondary | ICD-10-CM | POA: Diagnosis present

## 2021-08-22 DIAGNOSIS — C50919 Malignant neoplasm of unspecified site of unspecified female breast: Secondary | ICD-10-CM

## 2021-08-22 DIAGNOSIS — Z5111 Encounter for antineoplastic chemotherapy: Secondary | ICD-10-CM | POA: Insufficient documentation

## 2021-08-22 DIAGNOSIS — C50312 Malignant neoplasm of lower-inner quadrant of left female breast: Secondary | ICD-10-CM | POA: Diagnosis present

## 2021-08-22 DIAGNOSIS — C773 Secondary and unspecified malignant neoplasm of axilla and upper limb lymph nodes: Secondary | ICD-10-CM | POA: Diagnosis not present

## 2021-08-22 LAB — CBC WITH DIFFERENTIAL (CANCER CENTER ONLY)
Abs Immature Granulocytes: 0.01 10*3/uL (ref 0.00–0.07)
Basophils Absolute: 0.1 10*3/uL (ref 0.0–0.1)
Basophils Relative: 1 %
Eosinophils Absolute: 0.3 10*3/uL (ref 0.0–0.5)
Eosinophils Relative: 4 %
HCT: 39 % (ref 36.0–46.0)
Hemoglobin: 12.5 g/dL (ref 12.0–15.0)
Immature Granulocytes: 0 %
Lymphocytes Relative: 31 %
Lymphs Abs: 1.7 10*3/uL (ref 0.7–4.0)
MCH: 25.4 pg — ABNORMAL LOW (ref 26.0–34.0)
MCHC: 32.1 g/dL (ref 30.0–36.0)
MCV: 79.3 fL — ABNORMAL LOW (ref 80.0–100.0)
Monocytes Absolute: 0.3 10*3/uL (ref 0.1–1.0)
Monocytes Relative: 6 %
Neutro Abs: 3.3 10*3/uL (ref 1.7–7.7)
Neutrophils Relative %: 58 %
Platelet Count: 219 10*3/uL (ref 150–400)
RBC: 4.92 MIL/uL (ref 3.87–5.11)
RDW: 15.3 % (ref 11.5–15.5)
WBC Count: 5.7 10*3/uL (ref 4.0–10.5)
nRBC: 0 % (ref 0.0–0.2)

## 2021-08-22 LAB — CMP (CANCER CENTER ONLY)
ALT: 8 U/L (ref 0–44)
AST: 11 U/L — ABNORMAL LOW (ref 15–41)
Albumin: 4 g/dL (ref 3.5–5.0)
Alkaline Phosphatase: 59 U/L (ref 38–126)
Anion gap: 6 (ref 5–15)
BUN: 20 mg/dL (ref 8–23)
CO2: 29 mmol/L (ref 22–32)
Calcium: 9.1 mg/dL (ref 8.9–10.3)
Chloride: 104 mmol/L (ref 98–111)
Creatinine: 0.81 mg/dL (ref 0.44–1.00)
GFR, Estimated: >60 mL/min (ref >60)
Glucose, Bld: 153 mg/dL — ABNORMAL HIGH (ref 70–99)
Potassium: 4 mmol/L (ref 3.5–5.1)
Sodium: 139 mmol/L (ref 135–145)
Total Bilirubin: 0.5 mg/dL (ref 0.3–1.2)
Total Protein: 7.2 g/dL (ref 6.5–8.1)

## 2021-08-22 MED ORDER — DIPHENHYDRAMINE HCL 25 MG PO CAPS
50.0000 mg | ORAL_CAPSULE | Freq: Once | ORAL | Status: AC
  Administered 2021-08-22: 50 mg via ORAL
  Filled 2021-08-22: qty 2

## 2021-08-22 MED ORDER — TRASTUZUMAB-ANNS CHEMO 150 MG IV SOLR
6.0000 mg/kg | Freq: Once | INTRAVENOUS | Status: AC
  Administered 2021-08-22: 588 mg via INTRAVENOUS
  Filled 2021-08-22: qty 28

## 2021-08-22 MED ORDER — ACETAMINOPHEN 325 MG PO TABS
650.0000 mg | ORAL_TABLET | Freq: Once | ORAL | Status: AC
  Administered 2021-08-22: 650 mg via ORAL
  Filled 2021-08-22: qty 2

## 2021-08-22 MED ORDER — SODIUM CHLORIDE 0.9 % IV SOLN: Freq: Once | INTRAVENOUS | Status: AC

## 2021-08-22 MED ORDER — FULVESTRANT 250 MG/5ML IM SOSY
500.0000 mg | PREFILLED_SYRINGE | INTRAMUSCULAR | Status: DC
  Administered 2021-08-22: 500 mg via INTRAMUSCULAR
  Filled 2021-08-22: qty 10

## 2021-08-22 NOTE — Progress Notes (Signed)
Tami Schmidt: ?  ? ?Tami Han, MD (Inactive) ?No address on file ? ? ?DIAGNOSIS:  Cancer Staging  ?Chest wall recurrence of breast cancer ([place]) ?Staging form: Breast, AJCC 7th Edition ?- Clinical: Stage Unknown (TX, N1, M0) - Signed by Chauncey Cruel, MD on 07/27/2014 ? ?Malignant neoplasm of lower-inner quadrant of left breast in female, estrogen receptor positive ([place]) ?Staging form: Breast, AJCC 7th Edition ?- Clinical: Stage IA (T1b, N0, M0) - Signed by Chauncey Cruel, MD on 07/27/2014 ? ? ?SUMMARY OF ONCOLOGIC HISTORY: ? ?88 y.o. [place] woman ?  ?(1) status post right lumpectomy and axillary dissection in 1993 in [place] for what appears to have been a stage I breast cancer, treated with radiation and then tamoxifen for 5 years ?  ?(2) status post right mastectomy 2002 for a recurrence in the right breast, treated adjuvantly with tamoxifen for an additional 5 years ?  ?(3) status post left mastectomy and sentinel lymph node sampling 03/07/2003 for a lower inner quadrant pT1b, pN0, stage IA mucinous breast cancer, grade 2; no radiation therapy was given. Tamoxifen was continued until 2009 ?  ?RECURRENT DISEASE/ LEFT: OCTOBER 2014 ?(4) excisional biopsy of a left chest wall mass 01/26/2013 showing carcinoma consistent with invasive ductal carcinoma, estrogen receptor 100% positive, progesterone receptor 0% positive, with an MIB-1 of 22% and no HER-2 amplification.  ?  ?(5)  PET scan on 03/03/2013 confirmed extensive metastatic adenopathy in the left axilla, left subpectoral musculature, and left supraclavicular region ?  ?(6)  started on anastrozole daily beginning 03/01/2013, interrupted during radiation therapy, resumed March 2015, discontinued March 2016 because of fatigue and arthralgias ?  ?(7) radiation therapy 03/31/2013 through 05/23/2013 ?Site/dose:   The patient was treated initially with a forward treatment planning technique to the left chest wall  in addition to treatment to the supraclavicular region. This consisted of a 3-D conformal technique. The patient was treated in this fashion to a dose of 50.4 gray. The patient then received a 14 gray boost treatment using an electron field. The total dose was 64.4 gray. ?  ?(8) Osteopenia, zometa yearly started 07/27/2014, but poorly tolerated.  ?            (a) bone density scan on 12/27/13 showed a t-score of -1.8  ?            (b) started Denosumab/Prolia April 2017, canceled after one dose per patient ?  ?(9) tamoxifen started 07/27/2014, stopped November 2019 (had total 5 years of antiestrogens) ?  ?(10) likely thalassemia trait ?  ?(11) left subpectoral soft tissue nodule noted 07/17/2015 unchanged through serial scans, most recent 39/06/90, with no metabolic activity noted in PET scan of 2016 and interval calcification.  This is presumed benign ?  ?(12)  ADDITIONAL REGIONAL RECURRENCE: ?            (A) CT of the chest with contrast 03/05/2021 obtained to evaluate left upper extremity lymphedema as showed acute pulmonary emboli.  There was also left axillary and subpectoral lymphadenopathy ?            (B) rivaroxaban started 03/05/2021 ?            (C) left breast ultrasound 03/29/2021 shows 2 areas in the infraclavicular tissue consistent with fat necrosis.  There were however multiple retropectoral interpectoral and left axillary masses. ?            (D) left axillary lymph node biopsy 04/04/2021: IDC  grade 3, ER positive, 100%, strong staining intensity, PR positive, 20%, weak staining intensity, Ki-67 of 15%, HER2 positive by IHC, ratio 2.90 ?            (E) Fulvestrant every 4 weeks beginning 04/18/2021 ?            (F) Herceptin every 3 weeks beginning 05/30/2021 ?  ? ?CURRENT THERAPY: Trastuzumab & Fulvestrant ? ?INTERVAL HISTORY: ? ?Tami Schmidt 88 y.o. female returns for follow-Schmidt of her estrogen positive progesterone positive and HER2 positive breast cancer recurrence diagnosed in November 2022.    ? ?She is receiving fulvestrant every 4 weeks and Herceptin every 3 weeks.   ?Pathology from chest wall nodules, benign ?She feels very tired, constipated, no other side effects ?Last ECHO satisfactory ?She is very frustrated that the medication is making her very tired and she is not quite sure why she has no tumor by now since she has been taking the treatment for almost 3 to 4 months now.  She had many questions today about the stage, prognosis and role of chemotherapy.  Rest of the pertinent 10 point ROS reviewed and negative ? ?Patient Active Problem List  ? Diagnosis Date Noted  ? Genetic testing 07/24/2021  ? Microcytosis 04/29/2021  ? Hypokalemia 04/28/2021  ? SBO (small bowel obstruction) ([place]) 04/27/2021  ? Pulmonary embolism ([place])   ? 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 Hardening of the aorta (main artery of the heart) ([place]) 08/09/2020  ? Morbid obesity ([place]) 08/09/2020  ? Neuropathy 08/09/2020  ? Primary osteoarthritis 08/09/2020  ? Sensorineural hearing loss (SNHL) of both ears 08/07/2020  ? Anosmia 06/05/2020  ? Tinnitus, bilateral 06/05/2020  ? GI bleed 07/13/2019  ? Acute blood loss anemia 07/12/2019  ? Microcytic anemia 01/05/2018  ? Melena 01/04/2018  ? Lower GI bleed 01/04/2018  ? Type 2 diabetes mellitus without complication ([place]) 54/62/7035  ? Bacteremia due to methicillin susceptible Staphylococcus aureus (MSSA) 12/02/2017  ? Tenosynovitis of left wrist 12/02/2017  ? Infection of left wrist ([place]) 11/30/2017  ? Primary osteoarthritis of right hip 01/05/2017  ? Osteoarthritis of right hip 01/03/2017  ? Lymphedema of upper extremity 01/17/2014  ? Osteopenia 01/17/2014  ? 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  ? 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  ? Dilated pore of [place]iner 08/02/2013  ? 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 Scar 08/02/2013  ? Malignant neoplasm of lower-inner quadrant of left breast in female, estrogen receptor positive ([place]) 06/16/2013  ? Type II or unspecified type diabetes  mellitus without mention of complication, not stated as uncontrolled 03/25/2013  ? Essential hypertension 03/25/2013  ? Chest wall recurrence of breast cancer ([place]) 02/21/2013  ? Abdominal wall mass 01/14/2013  ? ? ?is allergic to bactrim, lisinopril, vasotec, and sulfamethoxazole-trimethoprim. ? ?MEDICAL HISTORY: ?Past Medical History:  ?Diagnosis Date  ? Arthritis   ? Breast cancer ([place])   ? b/l mastectomies hx  ? Cancer [facility])   ? breast  ? 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 Diabetes mellitus   ? fasting 90-100  ? HTN (hypertension) 03/25/2013  ? Hx of radiation therapy   ? breasts hx  ? Hypercholesterolemia   ? Hypertension   ? Lymphedema of arm   ? left arm  ? Pulmonary embolism ([place])   ? Type II or unspecified type diabetes mellitus without mention of complication, not stated as uncontrolled 03/25/2013  ? ? ?SURGICAL HISTORY: ?Past Surgical History:  ?Procedure Laterality Date  ? ABDOMINAL HYSTERECTOMY    ?  ANKLE SURGERY Right 1995  ? APPENDECTOMY    ? BIOPSY  01/07/2018  ? Procedure: BIOPSY;  Surgeon: [name], MD;  Location: [facility];  Service: Gastroenterology;;  ? BREAST SURGERY Bilateral   ? mastectomy  ? COLONOSCOPY WITH PROPOFOL N/A 01/07/2018  ? Procedure: COLONOSCOPY WITH PROPOFOL;  Surgeon: [name], MD;  [facility];  Service: Gastroenterology;  Laterality: N/A;  ? ESOPHAGOGASTRODUODENOSCOPY (EGD) WITH PROPOFOL N/A 01/06/2018  ? Procedure: ESOPHAGOGASTRODUODENOSCOPY (EGD) WITH PROPOFOL;  Surgeon: [name], MD;  [facility];  Service: Gastroenterology;  Laterality: N/A;  ? HERNIA REPAIR  04-26-2010  ? MASS EXCISION Left 01/26/2013  ? Procedure: EXCISION LEFT CHEST WALL MASS AND LEFT ABDOMNAL WALL MASS;  Surgeon: Harl Bowie, MD;  Location: [facility];  Service: General;  Laterality: Left;  ? MASS EXCISION Left 09/20/2014  ? Procedure: EXCISION OF LEFT CHEST WALL MASS;  Surgeon: Coralie Keens, MD;  Location: [facility];  Service:  General;  Laterality: Left;  ? TEE WITHOUT CARDIOVERSION N/A 12/08/2017  ? Procedure: TRANSESOPHAGEAL ECHOCARDIOGRAM (TEE);  Surgeon: Lelon Perla, MD;  Location: [facility];  Service: Cardiovascular;  [place]

## 2021-08-22 NOTE — Progress Notes (Signed)
Lt arm restricted for IV access due to lymphedema - Able to provide IV access # 20, 1.25" with Ultrasound for treatment  ?

## 2021-08-22 NOTE — Patient Instructions (Signed)
[facility] MEDICAL ONCOLOGY  Discharge Instructions: ?Thank you for choosing [facility] to provide your oncology and hematology care.  ? ?If you have a lab appointment with the Cancer Center, please go directly to the Cancer Center and check in at the registration area. ?  ?Wear comfortable clothing and clothing appropriate for easy access to any Portacath or PICC line.  ? ?We strive to give you quality time with your provider. You may need to reschedule your appointment if you arrive late (15 or more minutes).  Arriving late affects you and other patients whose appointments are after yours.  Also, if you miss three or more appointments without notifying the office, you may be dismissed from the clinic at the provider?s discretion.    ?  ?For prescription refill requests, have your pharmacy contact our office and allow 72 hours for refills to be completed.   ? ?Today you received the following chemotherapy and/or immunotherapy agents: Trastuzumab    ?  ?To help prevent nausea and vomiting after your treatment, we encourage you to take your nausea medication as directed. ? ?BELOW ARE SYMPTOMS THAT SHOULD BE REPORTED IMMEDIATELY: ?*FEVER GREATER THAN 100.4 F (38 ?C) OR HIGHER ?*CHILLS OR SWEATING ?*NAUSEA AND VOMITING THAT IS NOT CONTROLLED WITH YOUR NAUSEA MEDICATION ?*UNUSUAL SHORTNESS OF BREATH ?*UNUSUAL BRUISING OR BLEEDING ?*URINARY PROBLEMS (pain or burning when urinating, or frequent urination) ?*BOWEL PROBLEMS (unusual diarrhea, constipation, pain near the anus) ?TENDERNESS IN MOUTH AND THROAT WITH OR WITHOUT PRESENCE OF ULCERS (sore throat, sores in mouth, or a toothache) ?UNUSUAL RASH, SWELLING OR PAIN  ?UNUSUAL VAGINAL DISCHARGE OR ITCHING  ? ?Items with * indicate a potential emergency and should be followed up as soon as possible or go to the Emergency Department if any problems should occur. ? ?Please show the CHEMOTHERAPY ALERT CARD or IMMUNOTHERAPY ALERT CARD at check-in  to the Emergency Department and triage nurse. ? ?Should you have questions after your visit or need to cancel or reschedule your appointment, please contact [facility]  Dept: [phone]  and follow the prompts.  Office hours are 8:00 a.m. to 4:30 p.m. Monday - Friday. Please note that voicemails left after 4:00 p.m. may not be returned until the following business day.  We are closed weekends and major holidays. You have access to a nurse at all times for urgent questions. Please call the main number to the clinic Dept: [phone] and follow the prompts. ? ? ?For any non-urgent questions, you may also contact your provider using MyChart. We now offer e-Visits for anyone 18 and older to request care online for non-urgent symptoms. For details visit [email or web address]. ?  ?Also download the MyChart app! Go to the app store, search "MyChart", open the app, select [facility], and log in with your MyChart username and password. ? ?Due to Covid, a mask is required upon entering the hospital/clinic. If you do not have a mask, one will be given to you upon arrival. For doctor visits, patients may have 1 support [place] aged 18 or older with them. For treatment visits, patients cannot have anyone with them due to current Covid guidelines and our immunocompromised population.  ? ?

## 2021-08-23 ENCOUNTER — Other Ambulatory Visit: Payer: Self-pay | Admitting: Surgery

## 2021-08-23 LAB — CANCER ANTIGEN 27.29: CA 27.29: 52.6 U/mL — ABNORMAL HIGH (ref 0.0–38.6)

## 2021-08-26 ENCOUNTER — Ambulatory Visit (INDEPENDENT_AMBULATORY_CARE_PROVIDER_SITE_OTHER): Payer: Medicare Other | Admitting: Podiatry

## 2021-08-26 ENCOUNTER — Encounter: Payer: Self-pay | Admitting: Podiatry

## 2021-08-26 DIAGNOSIS — Q828 Other specified congenital malformations of skin: Secondary | ICD-10-CM

## 2021-08-26 DIAGNOSIS — I739 Peripheral vascular disease, unspecified: Secondary | ICD-10-CM

## 2021-08-26 DIAGNOSIS — M79674 Pain in right toe(s): Secondary | ICD-10-CM | POA: Diagnosis not present

## 2021-08-26 DIAGNOSIS — B351 Tinea unguium: Secondary | ICD-10-CM

## 2021-08-26 DIAGNOSIS — M79675 Pain in left toe(s): Secondary | ICD-10-CM

## 2021-08-26 DIAGNOSIS — E1142 Type 2 diabetes mellitus with diabetic polyneuropathy: Secondary | ICD-10-CM

## 2021-08-26 DIAGNOSIS — L84 Corns and callosities: Secondary | ICD-10-CM

## 2021-09-01 NOTE — Progress Notes (Signed)
  Subjective:  Patient ID: Tami Schmidt, female    DOB: 1932/12/12,  MRN: [id number]  Tami Schmidt presents to clinic today for at risk foot care with history of diabetic neuropathy and corn(s) b/l lower extremities, callus(es) right lower extremity, porokeratotic lesion(s) left lower extremity,and painful mycotic nails. Painful toenails interfere with ambulation. Aggravating factors include wearing enclosed shoe gear. Pain is relieved with periodic professional debridement. Painful corns, callus(es) and porokeratotic lesion(s) are aggravated when weightbearing with and without shoegear. Pain is relieved with periodic professional debridement.  Patient states blood glucose was 106 mg/dl today.  Last known HgA1c was 6.0%.  New problem(s): None.   PCP is [name], MD (Inactive) , and last visit was October, 2022.  Allergies  Allergen Reactions   Bactrim Swelling    SWELLING OF MOUTH/FACE.   Lisinopril Swelling    SWELLING OF MOUTH/FACE.   Vasotec Swelling    SWELLING OF MOUTH/FACE.   Sulfamethoxazole-Trimethoprim     Other reaction(s): Unknown    Review of Systems: Negative except as noted in the HPI.  Objective: No changes noted in today's physical examination. There were no vitals filed for this visit.  Tami Schmidt is a pleasant 88 y.o. female in NAD. AAO X 3.  Vascular Examination: CFT <4 seconds b/l LE. Faintly palpable DP pulses b/l LE. Diminished PT pulse(s) b/l LE. Pedal hair absent. No pain with calf compression b/l. Lower extremity skin temperature gradient within normal limits. Trace edema noted BLE. No cyanosis or clubbing noted b/l LE.  Dermatological Examination: Pedal integument with normal turgor, texture and tone b/l LE. No open wounds b/l. No interdigital macerations b/l. Toenails 1-5 b/l elongated, thickened, discolored with subungual debris. +Tenderness with dorsal palpation of nailplates. Hyperkeratotic lesion(s) noted distal tip left 2nd  toe, submet head 5 right foot, and plantarlateral aspect of midfoot left foot. Porokeratotic lesions submet head 4 left foot with tenderness to palpation. No erythema, no edema, no drainage, no flocculence. HKT bilateral 5th digits resolved.  Musculoskeletal Examination: Normal muscle strength 5/5 to all lower extremity muscle groups bilaterally. HAV with bunion deformity noted b/l LE. Hammertoe deformity noted 2-5 b/l. Pes planus deformity noted bilateral LE.Marland Kitchen No pain, crepitus or joint limitation noted with ROM b/l LE.  Patient ambulates independently without assistive aids.  Neurological Examination: Protective sensation intact 5/5 intact bilaterally with 10g monofilament b/l. Vibratory sensation diminished b/l.  Assessment/Plan: 1. Pain due to onychomycosis of toenails of both feet   2. Corns and callosities   3. Porokeratosis   4. Diabetic peripheral neuropathy associated with type 2 diabetes mellitus ([place])   5. PVD (peripheral vascular disease) ([place])      -Patient was evaluated and treated. All patient's and/or POA's questions/concerns answered on today's visit. -Toenails 1-5 b/l were debrided in length and girth with sterile nail nippers and dremel without iatrogenic bleeding.  -Corn(s) L 2nd toe pared utilizing sterile scalpel blade without complication or incident. Total number debrided=1. -Callus(es) submet head 5 right foot pared utilizing sterile scalpel blade without complication or incident. Total number debrided =1. -Porokeratotic lesion(s) submet head 4 left foot pared and enucleated with sterile scalpel blade without incident. Total number of lesions debrided=1. -Patient/POA to call should there be question/concern in the interim.   Return in about 3 months (around 11/26/2021).  Marzetta Board, DPM

## 2021-09-18 ENCOUNTER — Ambulatory Visit (HOSPITAL_COMMUNITY)
Admission: RE | Admit: 2021-09-18 | Discharge: 2021-09-18 | Disposition: A | Discharge status: Home / Self Care | Payer: Medicare Other | Source: Ambulatory Visit | Attending: Hematology and Oncology | Admitting: Hematology and Oncology

## 2021-09-18 DIAGNOSIS — Z170 Estrogen receptor positive status [ER+]: Secondary | ICD-10-CM | POA: Insufficient documentation

## 2021-09-18 DIAGNOSIS — C50312 Malignant neoplasm of lower-inner quadrant of left female breast: Secondary | ICD-10-CM | POA: Insufficient documentation

## 2021-09-18 MED ORDER — SODIUM CHLORIDE (PF) 0.9 % IJ SOLN
INTRAMUSCULAR | Status: AC
  Filled 2021-09-18: qty 50

## 2021-09-18 MED ORDER — IOHEXOL 300 MG/ML  SOLN
100.0000 mL | Freq: Once | INTRAMUSCULAR | Status: AC | PRN
  Administered 2021-09-18: 100 mL via INTRAVENOUS

## 2021-09-19 ENCOUNTER — Other Ambulatory Visit: Payer: Self-pay

## 2021-09-19 ENCOUNTER — Encounter: Payer: Self-pay | Admitting: Hematology and Oncology

## 2021-09-19 ENCOUNTER — Inpatient Hospital Stay: Payer: Medicare Other

## 2021-09-19 ENCOUNTER — Inpatient Hospital Stay (HOSPITAL_BASED_OUTPATIENT_CLINIC_OR_DEPARTMENT_OTHER): Payer: Medicare Other | Admitting: Hematology and Oncology

## 2021-09-19 ENCOUNTER — Other Ambulatory Visit: Payer: Self-pay | Admitting: *Deleted

## 2021-09-19 ENCOUNTER — Inpatient Hospital Stay: Payer: Medicare Other | Attending: Adult Health

## 2021-09-19 VITALS — BP 162/70 | HR 73 | Temp 97.9°F | Resp 19 | Ht 67.0 in | Wt 200.9 lb

## 2021-09-19 DIAGNOSIS — C50919 Malignant neoplasm of unspecified site of unspecified female breast: Secondary | ICD-10-CM

## 2021-09-19 DIAGNOSIS — C50312 Malignant neoplasm of lower-inner quadrant of left female breast: Secondary | ICD-10-CM

## 2021-09-19 DIAGNOSIS — C773 Secondary and unspecified malignant neoplasm of axilla and upper limb lymph nodes: Secondary | ICD-10-CM | POA: Diagnosis not present

## 2021-09-19 DIAGNOSIS — M8580 Other specified disorders of bone density and structure, unspecified site: Secondary | ICD-10-CM | POA: Insufficient documentation

## 2021-09-19 DIAGNOSIS — Z1379 Encounter for other screening for genetic and chromosomal anomalies: Secondary | ICD-10-CM

## 2021-09-19 DIAGNOSIS — Z170 Estrogen receptor positive status [ER+]: Secondary | ICD-10-CM

## 2021-09-19 DIAGNOSIS — Z5112 Encounter for antineoplastic immunotherapy: Secondary | ICD-10-CM | POA: Diagnosis present

## 2021-09-19 DIAGNOSIS — C7989 Secondary malignant neoplasm of other specified sites: Secondary | ICD-10-CM

## 2021-09-19 DIAGNOSIS — Z5111 Encounter for antineoplastic chemotherapy: Secondary | ICD-10-CM | POA: Insufficient documentation

## 2021-09-19 LAB — CMP (CANCER CENTER ONLY)
ALT: 7 U/L (ref 0–44)
AST: 11 U/L — ABNORMAL LOW (ref 15–41)
Albumin: 4.1 g/dL (ref 3.5–5.0)
Alkaline Phosphatase: 54 U/L (ref 38–126)
Anion gap: 9 (ref 5–15)
BUN: 19 mg/dL (ref 8–23)
CO2: 26 mmol/L (ref 22–32)
Calcium: 9.6 mg/dL (ref 8.9–10.3)
Chloride: 103 mmol/L (ref 98–111)
Creatinine: 0.7 mg/dL (ref 0.44–1.00)
GFR, Estimated: >60 mL/min (ref >60)
Glucose, Bld: 136 mg/dL — ABNORMAL HIGH (ref 70–99)
Potassium: 4 mmol/L (ref 3.5–5.1)
Sodium: 138 mmol/L (ref 135–145)
Total Bilirubin: 0.4 mg/dL (ref 0.3–1.2)
Total Protein: 7.2 g/dL (ref 6.5–8.1)

## 2021-09-19 LAB — CBC WITH DIFFERENTIAL (CANCER CENTER ONLY)
Abs Immature Granulocytes: 0.02 10*3/uL (ref 0.00–0.07)
Basophils Absolute: 0 10*3/uL (ref 0.0–0.1)
Basophils Relative: 1 %
Eosinophils Absolute: 0.2 10*3/uL (ref 0.0–0.5)
Eosinophils Relative: 4 %
HCT: 38.8 % (ref 36.0–46.0)
Hemoglobin: 12.3 g/dL (ref 12.0–15.0)
Immature Granulocytes: 0 %
Lymphocytes Relative: 33 %
Lymphs Abs: 1.9 10*3/uL (ref 0.7–4.0)
MCH: 25 pg — ABNORMAL LOW (ref 26.0–34.0)
MCHC: 31.7 g/dL (ref 30.0–36.0)
MCV: 78.9 fL — ABNORMAL LOW (ref 80.0–100.0)
Monocytes Absolute: 0.4 10*3/uL (ref 0.1–1.0)
Monocytes Relative: 7 %
Neutro Abs: 3.3 10*3/uL (ref 1.7–7.7)
Neutrophils Relative %: 55 %
Platelet Count: 254 10*3/uL (ref 150–400)
RBC: 4.92 MIL/uL (ref 3.87–5.11)
RDW: 15.2 % (ref 11.5–15.5)
WBC Count: 5.9 10*3/uL (ref 4.0–10.5)
nRBC: 0 % (ref 0.0–0.2)

## 2021-09-19 MED ORDER — TRASTUZUMAB-ANNS CHEMO 150 MG IV SOLR
6.0000 mg/kg | Freq: Once | INTRAVENOUS | Status: AC
  Administered 2021-09-19: 588 mg via INTRAVENOUS
  Filled 2021-09-19: qty 28

## 2021-09-19 MED ORDER — ACETAMINOPHEN 325 MG PO TABS
650.0000 mg | ORAL_TABLET | Freq: Once | ORAL | Status: AC
  Administered 2021-09-19: 650 mg via ORAL
  Filled 2021-09-19: qty 2

## 2021-09-19 MED ORDER — FULVESTRANT 250 MG/5ML IM SOSY
500.0000 mg | PREFILLED_SYRINGE | INTRAMUSCULAR | Status: DC
  Administered 2021-09-19: 500 mg via INTRAMUSCULAR
  Filled 2021-09-19: qty 10

## 2021-09-19 MED ORDER — DIPHENHYDRAMINE HCL 25 MG PO CAPS
50.0000 mg | ORAL_CAPSULE | Freq: Once | ORAL | Status: AC
  Administered 2021-09-19: 50 mg via ORAL
  Filled 2021-09-19: qty 2

## 2021-09-19 MED ORDER — SODIUM CHLORIDE 0.9 % IV SOLN: Freq: Once | INTRAVENOUS | Status: AC

## 2021-09-19 NOTE — Patient Instructions (Signed)
[facility] ONCOLOGY  Discharge Instructions: Thank you for choosing [facility] to provide your oncology and hematology care.   If you have a lab appointment with the [facility], please go directly to the [facility] and check in at the registration area.   Wear comfortable clothing and clothing appropriate for easy access to any Portacath or PICC line.   We strive to give you quality time with your provider. You may need to reschedule your appointment if you arrive late (15 or more minutes).  Arriving late affects you and other patients whose appointments are after yours.  Also, if you miss three or more appointments without notifying the office, you may be dismissed from the clinic at the provider's discretion.      For prescription refill requests, have your pharmacy contact our office and allow 72 hours for refills to be completed.    Today you received the following chemotherapy and/or immunotherapy agent: Trastuzumab-anns ([place]i)   To help prevent nausea and vomiting after your treatment, we encourage you to take your nausea medication as directed.  BELOW ARE SYMPTOMS THAT SHOULD BE REPORTED IMMEDIATELY: *FEVER GREATER THAN 100.4 F (38 C) OR HIGHER *CHILLS OR SWEATING *NAUSEA AND VOMITING THAT IS NOT CONTROLLED WITH YOUR NAUSEA MEDICATION *UNUSUAL SHORTNESS OF BREATH *UNUSUAL BRUISING OR BLEEDING *URINARY PROBLEMS (pain or burning when urinating, or frequent urination) *BOWEL PROBLEMS (unusual diarrhea, constipation, pain near the anus) TENDERNESS IN MOUTH AND THROAT WITH OR WITHOUT PRESENCE OF ULCERS (sore throat, sores in mouth, or a toothache) UNUSUAL RASH, SWELLING OR PAIN  UNUSUAL VAGINAL DISCHARGE OR ITCHING   Items with * indicate a potential emergency and should be followed up as soon as possible or go to the Emergency Department if any problems should occur.  Please show the CHEMOTHERAPY ALERT CARD or IMMUNOTHERAPY ALERT CARD  at check-in to the Emergency Department and triage nurse.  Should you have questions after your visit or need to cancel or reschedule your appointment, please contact [facility]  Dept: [phone]  and follow the prompts.  Office hours are 8:00 a.m. to 4:30 p.m. Monday - Friday. Please note that voicemails left after 4:00 p.m. may not be returned until the following business day.  We are closed weekends and major holidays. You have access to a nurse at all times for urgent questions. Please call the main number to the clinic Dept: [phone] and follow the prompts.   For any non-urgent questions, you may also contact your provider using MyChart. We now offer e-Visits for anyone 31 and older to request care online for non-urgent symptoms. For details visit [email or web address].   Also download the MyChart app! Go to the app store, search "MyChart", open the app, select [facility], and log in with your MyChart username and password.  Due to Covid, a mask is required upon entering the hospital/clinic. If you do not have a mask, one will be given to you upon arrival. For doctor visits, patients may have 1 support person aged 79 or older with them. For treatment visits, patients cannot have anyone with them due to current Covid guidelines and our immunocompromised population.   Fulvestrant injection What is this medication? FULVESTRANT (ful VES trant) blocks the effects of estrogen. It is used to treat breast cancer. This medicine may be used for other purposes; ask your health care provider or pharmacist if you have questions. COMMON BRAND NAME(S): FASLODEX What should I tell my care  team before I take this medication? They need to know if you have any of these conditions: bleeding disorders liver disease low blood counts, like low white cell, platelet, or red cell counts an unusual or allergic reaction to fulvestrant, other medicines, foods, dyes, or  preservatives pregnant or trying to get pregnant breast-feeding How should I use this medication? This medicine is for injection into a muscle. It is usually given by a health care professional in a hospital or clinic setting. Talk to your pediatrician regarding the use of this medicine in children. Special care may be needed. Overdosage: If you think you have taken too much of this medicine contact a poison control center or emergency room at once. NOTE: This medicine is only for you. Do not share this medicine with others. What if I miss a dose? It is important not to miss your dose. Call your doctor or health care professional if you are unable to keep an appointment. What may interact with this medication? medicines that treat or prevent blood clots like warfarin, enoxaparin, dalteparin, apixaban, dabigatran, and rivaroxaban This list may not describe all possible interactions. Give your health care provider a list of all the medicines, herbs, non-prescription drugs, or dietary supplements you use. Also tell them if you smoke, drink alcohol, or use illegal drugs. Some items may interact with your medicine. What should I watch for while using this medication? Your condition will be monitored carefully while you are receiving this medicine. You will need important blood work done while you are taking this medicine. Do not become pregnant while taking this medicine or for at least 1 year after stopping it. Women of child-bearing potential will need to have a negative pregnancy test before starting this medicine. Women should inform their doctor if they wish to become pregnant or think they might be pregnant. There is a potential for serious side effects to an unborn child. Men should inform their doctors if they wish to father a child. This medicine may lower sperm counts. Talk to your health care professional or pharmacist for more information. Do not breast-feed an infant while taking this medicine  or for 1 year after the last dose. What side effects may I notice from receiving this medication? Side effects that you should report to your doctor or health care professional as soon as possible: allergic reactions like skin rash, itching or hives, swelling of the face, lips, or tongue feeling faint or lightheaded, falls pain, tingling, numbness, or weakness in the legs signs and symptoms of infection like fever or chills; cough; flu-like symptoms; sore throat vaginal bleeding Side effects that usually do not require medical attention (report to your doctor or health care professional if they continue or are bothersome): aches, pains constipation diarrhea headache hot flashes nausea, vomiting pain at site where injected stomach pain This list may not describe all possible side effects. Call your doctor for medical advice about side effects. You may report side effects to FDA at 1-800-FDA-1088. Where should I keep my medication? This drug is given in a hospital or clinic and will not be stored at home. NOTE: This sheet is a summary. It may not cover all possible information. If you have questions about this medicine, talk to your doctor, pharmacist, or health care provider.  2023 Elsevier/Gold Standard (2017-07-14 00:00:00)

## 2021-09-19 NOTE — Progress Notes (Signed)
[facility] Follow up:    Tami Han, MD (Inactive) No address on file   DIAGNOSIS:  Cancer Staging  Chest wall recurrence of breast cancer [facility]) Staging form: Breast, AJCC 7th Edition - Clinical: Stage Unknown ([place], N1, M0) - Signed by Chauncey Cruel, MD on 07/27/2014  Malignant neoplasm of lower-inner quadrant of left breast in female, estrogen receptor positive ([place]) Staging form: Breast, AJCC 7th Edition - Clinical: Stage IA (T1b, N0, M0) - Signed by Chauncey Cruel, MD on 07/27/2014   SUMMARY OF ONCOLOGIC HISTORY:  88 y.o. [place] woman   (1) status post right lumpectomy and axillary dissection in 1993 in [place] for what appears to have been a stage I breast cancer, treated with radiation and then tamoxifen for 5 years   (2) status post right mastectomy 2002 for a recurrence in the right breast, treated adjuvantly with tamoxifen for an additional 5 years   (3) status post left mastectomy and sentinel lymph node sampling 03/07/2003 for a lower inner quadrant pT1b, pN0, stage IA mucinous breast cancer, grade 2; no radiation therapy was given. Tamoxifen was continued until 2009   RECURRENT DISEASE/ LEFT: OCTOBER 2014 (4) excisional biopsy of a left chest wall mass 01/26/2013 showing carcinoma consistent with invasive ductal carcinoma, estrogen receptor 100% positive, progesterone receptor 0% positive, with an MIB-1 of 22% and no HER-2 amplification.    (5)  PET scan on 03/03/2013 confirmed extensive metastatic adenopathy in the left axilla, left subpectoral musculature, and left supraclavicular region   (6)  started on anastrozole daily beginning 03/01/2013, interrupted during radiation therapy, resumed March 2015, discontinued March 2016 because of fatigue and arthralgias   (7) radiation therapy 03/31/2013 through 05/23/2013 Site/dose:   The patient was treated initially with a forward treatment planning technique to the left chest wall  in addition to treatment to the supraclavicular region. This consisted of a 3-D conformal technique. The patient was treated in this fashion to a dose of 50.4 gray. The patient then received a 14 gray boost treatment using an electron field. The total dose was 64.4 gray.   (8) Osteopenia, zometa yearly started 07/27/2014, but poorly tolerated.              (a) bone density scan on 12/27/13 showed a t-score of -1.8              (b) started Denosumab/Prolia April 2017, canceled after one dose per patient   (9) tamoxifen started 07/27/2014, stopped November 2019 (had total 5 years of antiestrogens)   (10) likely thalassemia trait   (11) left subpectoral soft tissue nodule noted 07/17/2015 unchanged through serial scans, most recent 45/80/9983, with no metabolic activity noted in PET scan of 2016 and interval calcification.  This is presumed benign   (12)  ADDITIONAL REGIONAL RECURRENCE:             (A) CT of the chest with contrast 03/05/2021 obtained to evaluate left upper extremity lymphedema as showed acute pulmonary emboli.  There was also left axillary and subpectoral lymphadenopathy             (B) rivaroxaban started 03/05/2021             (C) left breast ultrasound 03/29/2021 shows 2 areas in the infraclavicular tissue consistent with fat necrosis.  There were however multiple retropectoral interpectoral and left axillary masses.             (D) left axillary lymph node biopsy 04/04/2021: IDC  grade 3, ER positive, 100%, strong staining intensity, PR positive, 20%, weak staining intensity, Ki-67 of 15%, HER2 positive by IHC, ratio 2.90             (E) Fulvestrant every 4 weeks beginning 04/18/2021             (F) Herceptin every 3 weeks beginning 05/30/2021    CURRENT THERAPY: Trastuzumab & Fulvestrant  INTERVAL HISTORY:  Tami Schmidt 88 y.o. female returns for follow-up of her estrogen positive progesterone positive and HER2 positive breast cancer recurrence diagnosed in November 2022.     She is receiving herceptin and fulvestrant every 4 weeks She is having cold and runny nose since last week, no fevers.   She is just not feeling good, she says She denies any pain at all. No chest pain, SOB. She continues to lose weight, this is because she is not eating. No change in bowel habits or urinary habits, She has baseline constipation. Rest of the pertinent 10 point ROS reviewed and negative.   Patient Active Problem List   Diagnosis Date Noted   Genetic testing 07/24/2021   Microcytosis 04/29/2021   Hypokalemia 04/28/2021   SBO (small bowel obstruction) ([place]) 04/27/2021   Pulmonary embolism ([place])    Agnosia 08/09/2020   Hardening of the aorta (main artery of the heart) ([place]) 08/09/2020   Morbid obesity ([place]) 08/09/2020   Neuropathy 08/09/2020   Primary osteoarthritis 08/09/2020   Sensorineural hearing loss (SNHL) of both ears 08/07/2020   Anosmia 06/05/2020   Tinnitus, bilateral 06/05/2020   GI bleed 07/13/2019   Acute blood loss anemia 07/12/2019   Microcytic anemia 01/05/2018   Melena 01/04/2018   Lower GI bleed 01/04/2018   Type 2 diabetes mellitus without complication ([place]) 53/29/9242   Bacteremia due to methicillin susceptible Staphylococcus aureus (MSSA) 12/02/2017   Tenosynovitis of left wrist 12/02/2017   Infection of left wrist ([place]) 11/30/2017   Primary osteoarthritis of right hip 01/05/2017   Osteoarthritis of right hip 01/03/2017   Lymphedema of upper extremity 01/17/2014   Osteopenia 01/17/2014   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   Dilated pore of [place] 08/02/2013   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   Diabetes mellitus    fasting 90-100   HTN (hypertension) 03/25/2013   Hx of radiation therapy    breasts hx   Hypercholesterolemia    Hypertension    Lymphedema of arm    left arm   Pulmonary embolism ([place])    Type II or unspecified type diabetes mellitus without mention of complication, not stated as uncontrolled 03/25/2013    SURGICAL HISTORY: Past Surgical History:  Procedure Laterality Date   ABDOMINAL HYSTERECTOMY     ANKLE SURGERY Right 1995   APPENDECTOMY     BIOPSY  01/07/2018   Procedure: BIOPSY;  Surgeon: [name], MD;  Location: [facility];  Service: Gastroenterology;;   BREAST SURGERY Bilateral    mastectomy   COLONOSCOPY WITH PROPOFOL N/A 01/07/2018   Procedure: COLONOSCOPY WITH PROPOFOL;  Surgeon: [name], MD;  Location: [facility];  Service: Gastroenterology;  Laterality: N/A;   ESOPHAGOGASTRODUODENOSCOPY (EGD) WITH PROPOFOL N/A 01/06/2018   Procedure: ESOPHAGOGASTRODUODENOSCOPY (EGD) WITH PROPOFOL;  Surgeon: [name], MD;  Location: [facility];  Service: Gastroenterology;  Laterality: N/A;   HERNIA REPAIR  04-26-2010   MASS EXCISION Left 01/26/2013   Procedure: EXCISION LEFT CHEST WALL MASS AND LEFT ABDOMNAL WALL MASS;  Surgeon: Harl Bowie, MD;  Location: [facility];  Service: General;  Laterality: Left;   MASS EXCISION Left 09/20/2014   Procedure: EXCISION OF LEFT CHEST WALL MASS;  Surgeon: Coralie Keens, MD;  Location: [facility];  Service: General;  Laterality: Left;   TEE WITHOUT CARDIOVERSION N/A 12/08/2017   Procedure: TRANSESOPHAGEAL ECHOCARDIOGRAM (TEE);   Surgeon: Lelon Perla, MD;  Location: [facility];  Service: Cardiovascular;  Laterality: N/A;   TOTAL HIP ARTHROPLASTY Right 01/05/2017   TOTAL HIP ARTHROPLASTY Right 01/05/2017   Procedure: TOTAL HIP ARTHROPLASTY ANTERIOR APPROACH;  Surgeon: Frederik Pear, MD;  Location: [facility];  Service: Orthopedics;  Laterality: Right;    SOCIAL HISTORY: Social History   Socioeconomic History   Marital status: Married    Spouse name: Not on file   Number of children: Not on file   Years of education: Not on file   Highest education level: Not on file  Occupational History   Occupation: retired Education officer, museum  Tobacco Use   Smoking status: Former    Types: Cigarettes    Quit date: 04/14/1972    Years since quitting: 49.4   Smokeless tobacco: Never  Vaping Use   Vaping Use: Never used  Substance and Sexual Activity   Alcohol use: Yes    Comment: occasional   Drug use: Yes   Sexual activity: Yes    Birth control/protection: Surgical  Other Topics Concern   Not on file  Social History Narrative   Not on file   Social Determinants of Health   Financial Resource Strain: Not on file  Food Insecurity: Not on file  Transportation Needs: Not on file  Physical Activity: Not on file  Stress: Not on file  Social Connections: Not on file  Intimate Partner Violence: Not on file    FAMILY HISTORY: Family History  Problem Relation Age of Onset   Breast cancer Cousin        maternal first cousin   Uterine cancer Cousin        maternal first cousin    Review of Systems  Constitutional:  Negative for appetite change, chills, fatigue, fever and unexpected weight change.  HENT:   Negative for hearing loss, lump/mass and trouble swallowing.   Eyes:  Negative for eye problems and icterus.  Respiratory:  Negative for chest tightness, cough and shortness of breath.   Cardiovascular:  Negative for chest pain, leg swelling and palpitations.  Gastrointestinal:  Negative for abdominal distention,  abdominal pain, constipation, diarrhea, nausea and vomiting.  Endocrine: Negative for hot flashes.  Genitourinary:  Negative for difficulty urinating.   Musculoskeletal:  Negative for arthralgias.  Skin:  Positive for rash. Negative for itching.  Neurological:  Negative for dizziness, extremity weakness, headaches and numbness.  Hematological:  Negative for adenopathy. Does not bruise/bleed easily.  Psychiatric/Behavioral:  Negative for depression. The patient is not nervous/anxious.  PHYSICAL EXAMINATION  ECOG PERFORMANCE STATUS: 1 - Symptomatic but completely ambulatory  Vitals:   09/19/21 1330  BP: (!) 162/70  Pulse: 73  Resp: 19  Temp: 97.9 F (36.6 C)  SpO2: 100%     Physical Exam Constitutional:      General: She is not in acute distress.    Appearance: Normal appearance. She is not toxic-appearing.  HENT:     Head: Normocephalic and atraumatic.  Eyes:     General: No scleral icterus. Cardiovascular:     Rate and Rhythm: Normal rate and regular rhythm.     Pulses: Normal pulses.     Heart sounds: Normal heart sounds.  Pulmonary:     Effort: Pulmonary effort is normal.     Breath sounds: Normal breath sounds.  Chest:       Comments: Lymphadenopathy not palpable in the left axilla today. Abdominal:     General: Abdomen is flat. Bowel sounds are normal. There is no distension.     Palpations: Abdomen is soft.     Tenderness: There is no abdominal tenderness.  Musculoskeletal:        General: Swelling (Left upper extremity swelling) present.     Cervical back: Neck supple.  Lymphadenopathy:     Cervical: No cervical adenopathy.  Skin:    General: Skin is warm and dry.     Findings: No rash.     Comments: Skin nodules do not demonstrate any change since last visit  Neurological:     General: No focal deficit present.     Mental Status: She is alert.  Psychiatric:        Mood and Affect: Mood normal.        Behavior: Behavior normal.      LABORATORY DATA:  CBC    Component Value Date/Time   WBC 5.9 09/19/2021 1259   WBC 6.5 04/30/2021 0334   RBC 4.92 09/19/2021 1259   HGB 12.3 09/19/2021 1259   HGB 10.5 (L) 01/20/2017 1057   HCT 38.8 09/19/2021 1259   HCT 25.8 (L) 01/05/2018 0513   HCT 32.8 (L) 01/20/2017 1057   PLT 254 09/19/2021 1259   PLT 368 01/20/2017 1057   MCV 78.9 (L) 09/19/2021 1259   MCV 77.8 (L) 01/20/2017 1057   MCH 25.0 (L) 09/19/2021 1259   MCHC 31.7 09/19/2021 1259   RDW 15.2 09/19/2021 1259   RDW 16.0 (H) 01/20/2017 1057   LYMPHSABS 1.9 09/19/2021 1259   LYMPHSABS 1.3 01/20/2017 1057   MONOABS 0.4 09/19/2021 1259   MONOABS 0.4 01/20/2017 1057   EOSABS 0.2 09/19/2021 1259   EOSABS 0.1 01/20/2017 1057   BASOSABS 0.0 09/19/2021 1259   BASOSABS 0.1 01/20/2017 1057    CMP     Component Value Date/Time   NA 139 08/22/2021 1112   NA 139 01/20/2017 1057   K 4.0 08/22/2021 1112   K 3.9 01/20/2017 1057   CL 104 08/22/2021 1112   CO2 29 08/22/2021 1112   CO2 25 01/20/2017 1057   GLUCOSE 153 (H) 08/22/2021 1112   GLUCOSE 135 01/20/2017 1057   BUN 20 08/22/2021 1112   BUN 15.6 01/20/2017 1057   CREATININE 0.81 08/22/2021 1112   CREATININE 0.7 01/20/2017 1057   CALCIUM 9.1 08/22/2021 1112   CALCIUM 9.3 01/20/2017 1057   PROT 7.2 08/22/2021 1112   PROT 7.1 01/20/2017 1057   ALBUMIN 4.0 08/22/2021 1112   ALBUMIN 3.4 (L) 01/20/2017 1057   AST 11 (L) 08/22/2021 1112  AST 10 01/20/2017 1057   ALT 8 08/22/2021 1112   ALT 14 01/20/2017 1057   ALKPHOS 59 08/22/2021 1112   ALKPHOS 69 01/20/2017 1057   BILITOT 0.5 08/22/2021 1112   BILITOT 0.43 01/20/2017 1057   GFRNONAA >60 08/22/2021 1112   GFRAA >60 07/14/2019 0538     ASSESSMENT and THERAPY PLAN:   1) status post right lumpectomy and axillary dissection in 1993 in [place] for what appears to have been a stage I breast cancer, treated with radiation and then tamoxifen for 5 years   (2) status post right mastectomy 2002 for a  recurrence in the right breast, treated adjuvantly with tamoxifen for an additional 5 years   (3) status post left mastectomy and sentinel lymph node sampling 03/07/2003 for a lower inner quadrant pT1b, pN0, stage IA mucinous breast cancer, grade 2; no radiation therapy was given. Tamoxifen was continued until 2009   RECURRENT DISEASE/ LEFT: OCTOBER 2014 (4) excisional biopsy of a left chest wall mass 01/26/2013 showing carcinoma consistent with invasive ductal carcinoma, estrogen receptor 100% positive, progesterone receptor 0% positive, with an MIB-1 of 22% and no HER-2 amplification.    (5)  PET scan on 03/03/2013 confirmed extensive metastatic adenopathy in the left axilla, left subpectoral musculature, and left supraclavicular region   (6)  started on anastrozole daily beginning 03/01/2013, interrupted during radiation therapy, resumed March 2015, discontinued March 2016 because of fatigue and arthralgias   (7) radiation therapy 03/31/2013 through 05/23/2013 Site/dose:   The patient was treated initially with a forward treatment planning technique to the left chest wall in addition to treatment to the supraclavicular region. This consisted of a 3-D conformal technique. The patient was treated in this fashion to a dose of 50.4 gray. The patient then received a 14 gray boost treatment using an electron field. The total dose was 64.4 gray.   (8) Osteopenia, zometa yearly started 07/27/2014, but poorly tolerated.              (a) bone density scan on 12/27/13 showed a t-score of -1.8              (b) started Denosumab/Prolia April 2017, canceled after one dose per patient   (9) tamoxifen started 07/27/2014, stopped November 2019 (had total 5 years of antiestrogens)   (10) likely thalassemia trait   (11) left subpectoral soft tissue nodule noted 07/17/2015 unchanged through serial scans, most recent 88/82/8003, with no metabolic activity noted in PET scan of 2016 and interval calcification.   This is presumed benign   (12)  ADDITIONAL REGIONAL RECURRENCE:             (A) CT of the chest with contrast 03/05/2021 obtained to evaluate left upper extremity lymphedema as showed acute pulmonary emboli.  There was also left axillary and subpectoral lymphadenopathy             (B) rivaroxaban started 03/05/2021             (C) left breast ultrasound 03/29/2021 shows 2 areas in the infraclavicular tissue consistent with fat necrosis.  There were however multiple retropectoral interpectoral and left axillary masses.             (D) left axillary lymph node biopsy 04/04/2021   She had biopsy of the left axillary lymph node mass which showed invasive ductal carcinoma, grade 3, ER positive, 100%, strong staining intensity, PR positive, 20%, weak staining intensity, Ki-67 of 15%, HER2 positive by IHC, ratio 2.90 She was  initially scheduled for fulvestrant however given her to positive breast cancer, we have discussed about additional therapy today.  CT chest abdomen pelvis showed left axillary, left subpectoral, left supraclavicular and mediastinal adenopathy which is stable compared to November.  Scattered tiny pulmonary nodules, stable, hepatic steatosis.  Bone scan without any evidence of metastatic disease.  She was started on Herceptin along with Faslodex for treatment of local recurrence. Skin biopsy over left chest wall, benign   PLAN:   She is on herceptin and faslodex every 28 days, more for convenience since Herceptin is every 21 days and Faslodex every 28 days, she had to deal with multiple appointments Recent imaging pending official read, but on my evaluation, stable to smaller mass.  We will have to wait for official read. Ok to proceed with treatment as planned today Next ECHO in July, ordered today I do not believe she is a candidate for aggressive treatment given her age and comorbidities. With regards to the cold and congestion that she complains, she can try over-the-counter  Mucinex.  No adventitious sounds heard on physical examination today in the lungs.  She was however suggested to call us with any new questions or concerns. RTC in 4 weeks  Total encounter time: 30 minutes in face-to-face visit time, chart review, lab review, care coordination, order entry, and documentation of the encounter.  *Total Encounter Time as defined by the Centers for Medicare and Medicaid Services includes, in addition to the face-to-face time of a patient visit (documented in the note above) non-face-to-face time: obtaining and reviewing outside history, ordering and reviewing medications, tests or procedures, care coordination (communications with other health care professionals or caregivers) and documentation in the medical record.

## 2021-09-20 ENCOUNTER — Telehealth: Payer: Self-pay | Admitting: *Deleted

## 2021-09-20 ENCOUNTER — Other Ambulatory Visit: Payer: Self-pay | Admitting: Hematology and Oncology

## 2021-09-20 ENCOUNTER — Other Ambulatory Visit: Payer: Self-pay | Admitting: *Deleted

## 2021-09-20 DIAGNOSIS — C50312 Malignant neoplasm of lower-inner quadrant of left female breast: Secondary | ICD-10-CM

## 2021-09-20 DIAGNOSIS — I890 Lymphedema, not elsewhere classified: Secondary | ICD-10-CM

## 2021-09-20 DIAGNOSIS — C50919 Malignant neoplasm of unspecified site of unspecified female breast: Secondary | ICD-10-CM

## 2021-09-20 LAB — CANCER ANTIGEN 27.29: CA 27.29: 49.4 U/mL — ABNORMAL HIGH (ref 0.0–38.6)

## 2021-09-20 NOTE — Telephone Encounter (Signed)
This RN called pt attempted to contact pt per need to inform her of additional scans needed for thyroid evaluation as well as order placed for port placement with IR.  Obtained VM- general message left requesting a return call.

## 2021-09-20 NOTE — Progress Notes (Signed)
US thyroid ordered per recommendation from radiology

## 2021-09-24 ENCOUNTER — Ambulatory Visit
Admission: RE | Admit: 2021-09-24 | Discharge: 2021-09-24 | Disposition: A | Discharge status: Home / Self Care | Payer: Medicare Other | Source: Ambulatory Visit | Attending: Hematology and Oncology | Admitting: Hematology and Oncology

## 2021-09-24 DIAGNOSIS — Z170 Estrogen receptor positive status [ER+]: Secondary | ICD-10-CM

## 2021-09-25 ENCOUNTER — Telehealth: Payer: Self-pay

## 2021-09-25 NOTE — Telephone Encounter (Signed)
Patient called in regards to results of recent CT scan and purpose of US thyroid. Patient verbalized an understanding of the information. Confirmed upcoming appointments and times with patient. All questions answered during call.

## 2021-09-30 ENCOUNTER — Other Ambulatory Visit: Payer: Self-pay | Admitting: Hematology and Oncology

## 2021-09-30 DIAGNOSIS — E041 Nontoxic single thyroid nodule: Secondary | ICD-10-CM

## 2021-09-30 NOTE — Progress Notes (Signed)
US thyroid ordered for next year.

## 2021-10-02 ENCOUNTER — Other Ambulatory Visit: Payer: Self-pay | Admitting: *Deleted

## 2021-10-16 ENCOUNTER — Other Ambulatory Visit: Payer: Self-pay | Admitting: *Deleted

## 2021-10-16 DIAGNOSIS — C50919 Malignant neoplasm of unspecified site of unspecified female breast: Secondary | ICD-10-CM

## 2021-10-16 DIAGNOSIS — C50312 Malignant neoplasm of lower-inner quadrant of left female breast: Secondary | ICD-10-CM

## 2021-10-17 ENCOUNTER — Inpatient Hospital Stay: Payer: Medicare Other

## 2021-10-17 ENCOUNTER — Inpatient Hospital Stay: Payer: Medicare Other | Attending: Adult Health

## 2021-10-17 ENCOUNTER — Other Ambulatory Visit: Payer: Self-pay

## 2021-10-17 VITALS — BP 164/78 | HR 74 | Temp 97.5°F | Resp 18 | Wt 205.0 lb

## 2021-10-17 DIAGNOSIS — C50312 Malignant neoplasm of lower-inner quadrant of left female breast: Secondary | ICD-10-CM | POA: Insufficient documentation

## 2021-10-17 DIAGNOSIS — C50919 Malignant neoplasm of unspecified site of unspecified female breast: Secondary | ICD-10-CM

## 2021-10-17 DIAGNOSIS — Z5111 Encounter for antineoplastic chemotherapy: Secondary | ICD-10-CM | POA: Diagnosis present

## 2021-10-17 DIAGNOSIS — Z5112 Encounter for antineoplastic immunotherapy: Secondary | ICD-10-CM | POA: Insufficient documentation

## 2021-10-17 LAB — CMP (CANCER CENTER ONLY)
ALT: 9 U/L (ref 0–44)
AST: 11 U/L — ABNORMAL LOW (ref 15–41)
Albumin: 4.2 g/dL (ref 3.5–5.0)
Alkaline Phosphatase: 62 U/L (ref 38–126)
Anion gap: 10 (ref 5–15)
BUN: 20 mg/dL (ref 8–23)
CO2: 25 mmol/L (ref 22–32)
Calcium: 9.4 mg/dL (ref 8.9–10.3)
Chloride: 105 mmol/L (ref 98–111)
Creatinine: 0.81 mg/dL (ref 0.44–1.00)
GFR, Estimated: >60 mL/min (ref >60)
Glucose, Bld: 187 mg/dL — ABNORMAL HIGH (ref 70–99)
Potassium: 3.6 mmol/L (ref 3.5–5.1)
Sodium: 140 mmol/L (ref 135–145)
Total Bilirubin: 0.5 mg/dL (ref 0.3–1.2)
Total Protein: 7.4 g/dL (ref 6.5–8.1)

## 2021-10-17 LAB — CBC WITH DIFFERENTIAL (CANCER CENTER ONLY)
Abs Immature Granulocytes: 0.03 10*3/uL (ref 0.00–0.07)
Basophils Absolute: 0 10*3/uL (ref 0.0–0.1)
Basophils Relative: 1 %
Eosinophils Absolute: 0.3 10*3/uL (ref 0.0–0.5)
Eosinophils Relative: 5 %
HCT: 38.9 % (ref 36.0–46.0)
Hemoglobin: 12.3 g/dL (ref 12.0–15.0)
Immature Granulocytes: 1 %
Lymphocytes Relative: 30 %
Lymphs Abs: 1.8 10*3/uL (ref 0.7–4.0)
MCH: 25.2 pg — ABNORMAL LOW (ref 26.0–34.0)
MCHC: 31.6 g/dL (ref 30.0–36.0)
MCV: 79.7 fL — ABNORMAL LOW (ref 80.0–100.0)
Monocytes Absolute: 0.3 10*3/uL (ref 0.1–1.0)
Monocytes Relative: 6 %
Neutro Abs: 3.4 10*3/uL (ref 1.7–7.7)
Neutrophils Relative %: 57 %
Platelet Count: 224 10*3/uL (ref 150–400)
RBC: 4.88 MIL/uL (ref 3.87–5.11)
RDW: 16.1 % — ABNORMAL HIGH (ref 11.5–15.5)
WBC Count: 5.9 10*3/uL (ref 4.0–10.5)
nRBC: 0 % (ref 0.0–0.2)

## 2021-10-17 MED ORDER — ACETAMINOPHEN 325 MG PO TABS
650.0000 mg | ORAL_TABLET | Freq: Once | ORAL | Status: AC
  Administered 2021-10-17: 650 mg via ORAL
  Filled 2021-10-17: qty 2

## 2021-10-17 MED ORDER — DIPHENHYDRAMINE HCL 25 MG PO CAPS
50.0000 mg | ORAL_CAPSULE | Freq: Once | ORAL | Status: AC
  Administered 2021-10-17: 50 mg via ORAL
  Filled 2021-10-17: qty 2

## 2021-10-17 MED ORDER — FULVESTRANT 250 MG/5ML IM SOSY
500.0000 mg | PREFILLED_SYRINGE | INTRAMUSCULAR | Status: DC
  Administered 2021-10-17: 500 mg via INTRAMUSCULAR
  Filled 2021-10-17: qty 10

## 2021-10-17 MED ORDER — SODIUM CHLORIDE 0.9 % IV SOLN: Freq: Once | INTRAVENOUS | Status: AC

## 2021-10-17 MED ORDER — TRASTUZUMAB-ANNS CHEMO 150 MG IV SOLR
6.0000 mg/kg | Freq: Once | INTRAVENOUS | Status: AC
  Administered 2021-10-17: 588 mg via INTRAVENOUS
  Filled 2021-10-17: qty 28

## 2021-10-17 NOTE — Patient Instructions (Addendum)
[facility]  Discharge Instructions: Thank you for choosing [facility] to provide your oncology and hematology care.   If you have a lab appointment with the [facility], please go directly to the [facility] and check in at the registration area.   Wear comfortable clothing and clothing appropriate for easy access to any Portacath or PICC line.   We strive to give you quality time with your provider. You may need to reschedule your appointment if you arrive late (15 or more minutes).  Arriving late affects you and other patients whose appointments are after yours.  Also, if you miss three or more appointments without notifying the office, you may be dismissed from the clinic at the provider's discretion.      For prescription refill requests, have your pharmacy contact our office and allow 72 hours for refills to be completed.    Today you received the following chemotherapy and/or immunotherapy agent: Trastuzumab-anns ([place]) and fulvestrant   To help prevent nausea and vomiting after your treatment, we encourage you to take your nausea medication as directed.  BELOW ARE SYMPTOMS THAT SHOULD BE REPORTED IMMEDIATELY: *FEVER GREATER THAN 100.4 F (38 C) OR HIGHER *CHILLS OR SWEATING *NAUSEA AND VOMITING THAT IS NOT CONTROLLED WITH YOUR NAUSEA MEDICATION *UNUSUAL SHORTNESS OF BREATH *UNUSUAL BRUISING OR BLEEDING *URINARY PROBLEMS (pain or burning when urinating, or frequent urination) *BOWEL PROBLEMS (unusual diarrhea, constipation, pain near the anus) TENDERNESS IN MOUTH AND THROAT WITH OR WITHOUT PRESENCE OF ULCERS (sore throat, sores in mouth, or a toothache) UNUSUAL RASH, SWELLING OR PAIN  UNUSUAL VAGINAL DISCHARGE OR ITCHING   Items with * indicate a potential emergency and should be followed up as soon as possible or go to the Emergency Department if any problems should occur.  Please show the CHEMOTHERAPY ALERT CARD or  IMMUNOTHERAPY ALERT CARD at check-in to the Emergency Department and triage nurse.  Should you have questions after your visit or need to cancel or reschedule your appointment, please contact [facility]  Dept: [phone]  and follow the prompts.  Office hours are 8:00 a.m. to 4:30 p.m. Monday - Friday. Please note that voicemails left after 4:00 p.m. may not be returned until the following business day.  We are closed weekends and major holidays. You have access to a nurse at all times for urgent questions. Please call the main number to the clinic Dept: [phone] and follow the prompts.   For any non-urgent questions, you may also contact your provider using MyChart. We now offer e-Visits for anyone 42 and older to request care online for non-urgent symptoms. For details visit [email or web address].   Also download the MyChart app! Go to the app store, search "MyChart", open the app, select [facility], and log in with your MyChart username and password.  Due to Covid, a mask is required upon entering the hospital/clinic. If you do not have a mask, one will be given to you upon arrival. For doctor visits, patients may have 1 support person aged 46 or older with them. For treatment visits, patients cannot have anyone with them due to current Covid guidelines and our immunocompromised population.

## 2021-10-18 LAB — CANCER ANTIGEN 27.29: CA 27.29: 42 U/mL — ABNORMAL HIGH (ref 0.0–38.6)

## 2021-10-21 ENCOUNTER — Other Ambulatory Visit: Payer: Self-pay | Admitting: Hematology and Oncology

## 2021-10-23 ENCOUNTER — Ambulatory Visit (HOSPITAL_COMMUNITY)
Admission: RE | Admit: 2021-10-23 | Discharge: 2021-10-23 | Disposition: A | Discharge status: Home / Self Care | Payer: Medicare Other | Source: Ambulatory Visit | Attending: Hematology and Oncology | Admitting: Hematology and Oncology

## 2021-10-23 DIAGNOSIS — Z0189 Encounter for other specified special examinations: Secondary | ICD-10-CM | POA: Diagnosis not present

## 2021-10-23 DIAGNOSIS — I358 Other nonrheumatic aortic valve disorders: Secondary | ICD-10-CM | POA: Diagnosis not present

## 2021-10-23 DIAGNOSIS — C50919 Malignant neoplasm of unspecified site of unspecified female breast: Secondary | ICD-10-CM

## 2021-10-23 DIAGNOSIS — C7989 Secondary malignant neoplasm of other specified sites: Secondary | ICD-10-CM | POA: Diagnosis not present

## 2021-10-23 DIAGNOSIS — C50312 Malignant neoplasm of lower-inner quadrant of left female breast: Secondary | ICD-10-CM | POA: Insufficient documentation

## 2021-10-23 DIAGNOSIS — Z170 Estrogen receptor positive status [ER+]: Secondary | ICD-10-CM | POA: Diagnosis not present

## 2021-10-23 LAB — ECHOCARDIOGRAM COMPLETE
AR max vel: 2.19 cm2
AV Area VTI: 2.05 cm2
AV Area mean vel: 2 cm2
AV Mean grad: 5 mmHg
AV Peak grad: 8.9 mmHg
Ao pk vel: 1.49 m/s
Area-P 1/2: 4.63 cm2
S' Lateral: 2.6 cm

## 2021-10-23 NOTE — Progress Notes (Signed)
  Echocardiogram 2D Echocardiogram has been performed.  Joette Catching 10/23/2021, 11:50 AM

## 2021-10-24 ENCOUNTER — Telehealth: Payer: Self-pay | Admitting: Hematology and Oncology

## 2021-10-24 NOTE — Telephone Encounter (Signed)
Scheduled appointment per provider. Left message with appointment details.  

## 2021-10-28 ENCOUNTER — Other Ambulatory Visit: Payer: Self-pay | Admitting: Internal Medicine

## 2021-10-28 DIAGNOSIS — Z170 Estrogen receptor positive status [ER+]: Secondary | ICD-10-CM

## 2021-10-28 DIAGNOSIS — C50312 Malignant neoplasm of lower-inner quadrant of left female breast: Secondary | ICD-10-CM

## 2021-10-29 ENCOUNTER — Ambulatory Visit (HOSPITAL_COMMUNITY)
Admission: RE | Admit: 2021-10-29 | Discharge: 2021-10-29 | Disposition: A | Discharge status: Home / Self Care | Payer: Medicare Other | Source: Ambulatory Visit | Attending: Hematology and Oncology | Admitting: Hematology and Oncology

## 2021-10-29 ENCOUNTER — Other Ambulatory Visit: Payer: Self-pay

## 2021-10-29 ENCOUNTER — Encounter (HOSPITAL_COMMUNITY): Payer: Self-pay

## 2021-10-29 DIAGNOSIS — Z87891 Personal history of nicotine dependence: Secondary | ICD-10-CM | POA: Insufficient documentation

## 2021-10-29 DIAGNOSIS — R591 Generalized enlarged lymph nodes: Secondary | ICD-10-CM | POA: Insufficient documentation

## 2021-10-29 DIAGNOSIS — M7989 Other specified soft tissue disorders: Secondary | ICD-10-CM | POA: Insufficient documentation

## 2021-10-29 DIAGNOSIS — C50312 Malignant neoplasm of lower-inner quadrant of left female breast: Secondary | ICD-10-CM | POA: Insufficient documentation

## 2021-10-29 DIAGNOSIS — E119 Type 2 diabetes mellitus without complications: Secondary | ICD-10-CM | POA: Insufficient documentation

## 2021-10-29 DIAGNOSIS — Z170 Estrogen receptor positive status [ER+]: Secondary | ICD-10-CM | POA: Diagnosis not present

## 2021-10-29 HISTORY — PX: IR IMAGING GUIDED PORT INSERTION: IMG5740

## 2021-10-29 LAB — GLUCOSE, CAPILLARY: Glucose-Capillary: 146 mg/dL — ABNORMAL HIGH (ref 70–99)

## 2021-10-29 MED ORDER — HEPARIN SOD (PORK) LOCK FLUSH 100 UNIT/ML IV SOLN
INTRAVENOUS | Status: AC
  Filled 2021-10-29: qty 5

## 2021-10-29 MED ORDER — MIDAZOLAM HCL 2 MG/2ML IJ SOLN
INTRAMUSCULAR | Status: AC | PRN
  Administered 2021-10-29 (×2): .5 mg via INTRAVENOUS

## 2021-10-29 MED ORDER — FENTANYL CITRATE (PF) 100 MCG/2ML IJ SOLN
INTRAMUSCULAR | Status: AC
  Filled 2021-10-29: qty 2

## 2021-10-29 MED ORDER — HEPARIN SOD (PORK) LOCK FLUSH 100 UNIT/ML IV SOLN
INTRAVENOUS | Status: AC | PRN
  Administered 2021-10-29: 500 [IU] via INTRAVENOUS

## 2021-10-29 MED ORDER — SODIUM CHLORIDE 0.9 % IV SOLN: INTRAVENOUS | Status: DC

## 2021-10-29 MED ORDER — LIDOCAINE HCL 1 % IJ SOLN
INTRAMUSCULAR | Status: AC
  Filled 2021-10-29: qty 20

## 2021-10-29 MED ORDER — FENTANYL CITRATE (PF) 100 MCG/2ML IJ SOLN
INTRAMUSCULAR | Status: AC | PRN
  Administered 2021-10-29 (×2): 50 ug via INTRAVENOUS

## 2021-10-29 MED ORDER — MIDAZOLAM HCL 2 MG/2ML IJ SOLN
INTRAMUSCULAR | Status: AC
  Filled 2021-10-29: qty 2

## 2021-10-29 MED ORDER — LIDOCAINE-EPINEPHRINE 1 %-1:100000 IJ SOLN
INTRAMUSCULAR | Status: AC
  Filled 2021-10-29: qty 1

## 2021-10-29 MED ORDER — LIDOCAINE-EPINEPHRINE 1 %-1:100000 IJ SOLN
INTRAMUSCULAR | Status: AC | PRN
  Administered 2021-10-29: 20 mL

## 2021-10-29 NOTE — H&P (Signed)
Chief Complaint: Patient was seen in consultation today for left breast cancer at the request of Jal  Referring Physician(s): Iruku,Praveena  Supervising Physician: Markus Daft  Patient Status: [facility] - Out-pt  History of Present Illness: Tami Schmidt is a 88 y.o. female with chest wall recurrence of breast cancer who will begin immunotherapy.  She is in need of Port placement.  She reports feeling well overall.  She endorses swelling of the LUE which she associated with lymphadenopathy.  Denies HA, dizziness, CP, SOB, palpitations, N/V, change in bowel or urinary habits.  She walks with aid of cane.    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   Diabetes mellitus    fasting 90-100   HTN (hypertension) 03/25/2013   Hx of radiation therapy    breasts hx   Hypercholesterolemia    Hypertension    Lymphedema of arm    left arm   Pulmonary embolism ([place])    Type II or unspecified type diabetes mellitus without mention of complication, not stated as uncontrolled 03/25/2013    Past Surgical History:  Procedure Laterality Date   ABDOMINAL HYSTERECTOMY     ANKLE SURGERY Right 1995   APPENDECTOMY     BIOPSY  01/07/2018   Procedure: BIOPSY;  Surgeon: [name], MD;  Location: [facility];  Service: Gastroenterology;;   BREAST SURGERY Bilateral    mastectomy   COLONOSCOPY WITH PROPOFOL N/A 01/07/2018   Procedure: COLONOSCOPY WITH PROPOFOL;  Surgeon: [name], MD;  Location: [facility];  Service: Gastroenterology;  Laterality: N/A;   ESOPHAGOGASTRODUODENOSCOPY (EGD) WITH PROPOFOL N/A 01/06/2018   Procedure: ESOPHAGOGASTRODUODENOSCOPY (EGD) WITH PROPOFOL;  Surgeon: [name], MD;  [facility]: [facility];  Service: Gastroenterology;  Laterality: N/A;   HERNIA REPAIR  04-26-2010   MASS EXCISION Left 01/26/2013   Procedure:  EXCISION LEFT CHEST WALL MASS AND LEFT ABDOMNAL WALL MASS;  Surgeon: Harl Bowie, MD;  Location: [place];  Service: General;  Laterality: Left;   MASS EXCISION Left 09/20/2014   Procedure: EXCISION OF LEFT CHEST WALL MASS;  Surgeon: Coralie Keens, MD;  Location: [place];  Service: General;  Laterality: Left;   TEE WITHOUT CARDIOVERSION N/A 12/08/2017   Procedure: TRANSESOPHAGEAL ECHOCARDIOGRAM (TEE);  Surgeon: Lelon Perla, MD;  Location: [facility];  Service: Cardiovascular;  Laterality: N/A;   TOTAL HIP ARTHROPLASTY Right 01/05/2017   TOTAL HIP ARTHROPLASTY Right 01/05/2017   Procedure: TOTAL HIP ARTHROPLASTY ANTERIOR APPROACH;  Surgeon: Frederik Pear, MD;  Location: [place];  Service: Orthopedics;  Laterality: Right;    Allergies: Bactrim, Lisinopril, Vasotec, and Sulfamethoxazole-trimethoprim  Medications: Prior to Admission medications   Medication Sig Start Date End Date Taking? Authorizing Provider  amLODipine (NORVASC) 10 MG tablet 1 tablet   Yes [provider]  apixaban (ELIQUIS) 5 MG TABS tablet Take 1 tablet (5 mg total) by mouth 2 (two) times daily. 08/07/21  Yes Benay Pike, MD  metFORMIN (GLUCOPHAGE) 500 MG tablet Take 500 mg by mouth daily with breakfast.   Yes [provider]  metoprolol succinate (TOPROL-XL) 25 MG 24 hr tablet Take by mouth.   Yes [provider]  olmesartan-hydrochlorothiazide (BENICAR HCT) 40-12.5 MG tablet Take 1 tablet by mouth daily.   Yes [provider]  SM IRON 325 (65 Fe) MG tablet Take 325 mg  by mouth daily. 07/22/19  Yes [provider]  Turmeric (QC TUMERIC COMPLEX PO) Take 1 tablet by mouth daily.   Yes [provider]  TYLENOL 325 MG tablet Take 325 mg by mouth daily. 10/26/20  Yes [provider]  VITAMIN D PO Take 1 tablet by mouth daily.   Yes [provider]  diclofenac Sodium (VOLTAREN) 1 % GEL Apply 2 g topically daily as needed (For topical  arthritis.). 03/22/19   [provider]  HUMULIN N 100 UNIT/ML injection Inject 30 Units into the skin daily. 11/29/16   [provider]     Family History  Problem Relation Age of Onset   Breast cancer Cousin        maternal first cousin   Uterine cancer Cousin        maternal first cousin    Social History   Socioeconomic History   Marital status: Married    Spouse name: Not on file   Number of children: Not on file   Years of education: Not on file   Highest education level: Not on file  Occupational History   Occupation: retired Education officer, museum  Tobacco Use   Smoking status: Former    Types: Cigarettes    Quit date: 04/14/1972    Years since quitting: 49.5   Smokeless tobacco: Never  Vaping Use   Vaping Use: Never used  Substance and Sexual Activity   Alcohol use: Yes    Comment: occasional   Drug use: Yes   Sexual activity: Yes    Birth control/protection: Surgical  Other Topics Concern   Not on file  Social History Narrative   Not on file   Social Determinants of Health   Financial Resource Strain: Not on file  Food Insecurity: Not on file  Transportation Needs: Not on file  Physical Activity: Not on file  Stress: Not on file  Social Connections: Not on file   Review of Systems: A 12 point ROS discussed and pertinent positives are indicated in the HPI above.  All other systems are negative.  Vital Signs: BP (!) 165/74 (BP Location: Right Arm)   Pulse 76   Temp (!) 97.5 F (36.4 C) (Oral)   Resp 15   Ht '5\' 7"'$  (1.702 m)   Wt 200 lb (90.7 kg)   SpO2 99%   BMI 31.32 kg/m   Physical Exam Constitutional:      General: She is not in acute distress. HENT:     Mouth/Throat:     Mouth: Mucous membranes are moist.     Pharynx: Oropharynx is clear.  Eyes:     Extraocular Movements: Extraocular movements intact.     Conjunctiva/sclera: Conjunctivae normal.  Cardiovascular:     Rate and Rhythm: Normal rate and regular rhythm.     Pulses:  Normal pulses.     Heart sounds: Normal heart sounds.  Pulmonary:     Effort: Pulmonary effort is normal.     Breath sounds: Normal breath sounds.  Abdominal:     General: Abdomen is flat.     Palpations: Abdomen is soft.  Musculoskeletal:        General: Swelling present.     Comments: LUE lymphadenopathy  Skin:    General: Skin is warm and dry.  Neurological:     General: No focal deficit present.     Mental Status: She is alert and oriented to person, place, and time.  Psychiatric:  Mood and Affect: Mood normal.        Behavior: Behavior normal.     Imaging: ECHOCARDIOGRAM COMPLETE  Result Date: 10/23/2021    ECHOCARDIOGRAM REPORT   Patient Name:   TRINTY MARKEN Date of Exam: 10/23/2021 Medical Rec #:  [id number]       Height:       67.0 in Accession #:    [id number]      Weight:       205.0 lb Date of Birth:  02/15/1933       BSA:          2.044 m Patient Age:    52 years        BP:           180/68 mmHg Patient Gender: F               HR:           81 bpm. Exam Location:  Outpatient Procedure: 2D Echo, Strain Analysis, Color Doppler and Cardiac Doppler Indications:    Chemo  History:        Patient has prior history of Echocardiogram examinations, most                 recent 08/09/2021.  Sonographer:    Joette Catching RCS Referring Phys: [phone] [place]  Sonographer Comments: Global longitudinal strain was attempted. IMPRESSIONS  1. Left ventricular ejection fraction, by estimation, is 60 to 65%. The left ventricle has normal function. The left ventricle has no regional wall motion abnormalities. There is moderate asymmetric left ventricular hypertrophy of the basal-septal segment. Left ventricular diastolic parameters are consistent with Grade II diastolic dysfunction (pseudonormalization). The average left ventricular global longitudinal strain is -15.5 %. GLS underestimated due to suboptimal endocardial tracking.  2. Right ventricular systolic function is normal. The  right ventricular size is normal.  3. Left atrial size was moderately dilated.  4. Right atrial size was mildly dilated.  5. The mitral valve is normal in structure. Trivial mitral valve regurgitation. No evidence of mitral stenosis.  6. The aortic valve is tricuspid. There is mild calcification of the aortic valve. Aortic valve regurgitation is not visualized. Aortic valve sclerosis/calcification is present, without any evidence of aortic stenosis.  7. The inferior vena cava is normal in size with greater than 50% respiratory variability, suggesting right atrial pressure of 3 mmHg. FINDINGS  Left Ventricle: Left ventricular ejection fraction, by estimation, is 60 to 65%. The left ventricle has normal function. The left ventricle has no regional wall motion abnormalities. The average left ventricular global longitudinal strain is -15.5 %. The left ventricular internal cavity size was normal in size. There is moderate asymmetric left ventricular hypertrophy of the basal-septal segment. Left ventricular diastolic parameters are consistent with Grade II diastolic dysfunction (pseudonormalization). Right Ventricle: The right ventricular size is normal. No increase in right ventricular wall thickness. Right ventricular systolic function is normal. Left Atrium: Left atrial size was moderately dilated. Right Atrium: Right atrial size was mildly dilated. Pericardium: There is no evidence of pericardial effusion. Mitral Valve: The mitral valve is normal in structure. Mild to moderate mitral annular calcification. Trivial mitral valve regurgitation. No evidence of mitral valve stenosis. Tricuspid Valve: The tricuspid valve is normal in structure. Tricuspid valve regurgitation is trivial. No evidence of tricuspid stenosis. Aortic Valve: The aortic valve is tricuspid. There is mild calcification of the aortic valve. Aortic valve regurgitation is not visualized. Aortic valve sclerosis/calcification is present, without any  evidence of aortic stenosis. Aortic valve mean gradient measures 5.0 mmHg. Aortic valve peak gradient measures 8.9 mmHg. Aortic valve area, by VTI measures 2.05 cm. Pulmonic Valve: The pulmonic valve was normal in structure. Pulmonic valve regurgitation is not visualized. No evidence of pulmonic stenosis. Aorta: The aortic root is normal in size and structure. Venous: The inferior vena cava is normal in size with greater than 50% respiratory variability, suggesting right atrial pressure of 3 mmHg. IAS/Shunts: No atrial level shunt detected by color flow Doppler.  LEFT VENTRICLE PLAX 2D LVIDd:         3.80 cm   Diastology LVIDs:         2.60 cm   LV e' medial:    6.53 cm/s LV PW:         1.00 cm   LV E/e' medial:  20.4 LV IVS:        1.70 cm   LV e' lateral:   6.53 cm/s LVOT diam:     2.00 cm   LV E/e' lateral: 20.4 LV SV:         65 LV SV Index:   32        2D Longitudinal Strain LVOT Area:     3.14 cm  2D Strain GLS Avg:     -15.5 %  RIGHT VENTRICLE             IVC RV Basal diam:  4.00 cm     IVC diam: 1.60 cm RV Mid diam:    3.70 cm RV S prime:     17.10 cm/s TAPSE (M-mode): 3.0 cm LEFT ATRIUM              Index        RIGHT ATRIUM           Index LA diam:        3.90 cm  1.91 cm/m   RA Area:     16.80 cm LA Vol (A2C):   126.0 ml 61.66 ml/m  RA Volume:   43.10 ml  21.09 ml/m LA Vol (A4C):   80.1 ml  39.20 ml/m LA Biplane Vol: 103.0 ml 50.40 ml/m  AORTIC VALVE                     PULMONIC VALVE AV Area (Vmax):    2.19 cm      PR End Diast Vel: 4.75 msec AV Area (Vmean):   2.00 cm AV Area (VTI):     2.05 cm AV Vmax:           149.00 cm/s AV Vmean:          105.000 cm/s AV VTI:            0.315 m AV Peak Grad:      8.9 mmHg AV Mean Grad:      5.0 mmHg LVOT Vmax:         104.00 cm/s LVOT Vmean:        67.000 cm/s LVOT VTI:          0.206 m LVOT/AV VTI ratio: 0.65  AORTA Ao Root diam: 3.00 cm Ao Asc diam:  2.90 cm MITRAL VALVE                TRICUSPID VALVE MV Area (PHT): 4.63 cm     TR Peak grad:   30.5  mmHg MV Decel Time: 164 msec     TR Vmax:  276.00 cm/s MV E velocity: 133.00 cm/s MV A velocity: 90.00 cm/s   SHUNTS MV E/A ratio:  1.48         Systemic VTI:  0.21 m                             Systemic Diam: 2.00 cm Glori Bickers MD Electronically signed by Glori Bickers MD Signature Date/Time: 10/23/2021/12:02:40 PM    Final     Labs:  CBC: Recent Labs    08/01/21 1227 08/22/21 1112 09/19/21 1259 10/17/21 1321  WBC 5.3 5.7 5.9 5.9  HGB 12.3 12.5 12.3 12.3  HCT 38.5 39.0 38.8 38.9  PLT 221 219 254 224    COAGS: Recent Labs    04/28/21 0702 04/28/21 1010 04/29/21 0551 04/30/21 0334  INR 1.2  --   --   --   APTT 130* 93* 98* 95*    BMP: Recent Labs    08/01/21 1227 08/22/21 1112 09/19/21 1259 10/17/21 1321  NA 138 139 138 140  K 4.0 4.0 4.0 3.6  CL 103 104 103 105  CO2 '29 29 26 25  '$ GLUCOSE 140* 153* 136* 187*  BUN '17 20 19 20  '$ CALCIUM 9.4 9.1 9.6 9.4  CREATININE 0.77 0.81 0.70 0.81  GFRNONAA >60 >60 >60 >60    LIVER FUNCTION TESTS: Recent Labs    08/01/21 1227 08/22/21 1112 09/19/21 1259 10/17/21 1321  BILITOT 0.4 0.5 0.4 0.5  AST 11* 11* 11* 11*  ALT '9 8 7 9  '$ ALKPHOS 64 59 54 62  PROT 7.4 7.2 7.2 7.4  ALBUMIN 4.1 4.0 4.1 4.2    Assessment and Plan:  Breast cancer recurrence in the chest wall --immunotherapy via IV route --in need of Port-a-Cath placement with planned d/c this afternoon  Risks and benefits of image guided port-a-catheter placement was discussed with the patient including, but not limited to bleeding, infection, pneumothorax, or fibrin sheath development and need for additional procedures.  All of the patient's questions were answered, patient is agreeable to proceed. Consent signed and in chart.   Thank you for this interesting consult.  I greatly enjoyed meeting Zahara B Yip and look forward to participating in their care.  A copy of this report was sent to the requesting provider on this date.  Electronically  Signed: Pasty Spillers, PA 10/29/2021, 1:04 PM   I spent a total of 15 Minutes  in face to face in clinical consultation, greater than 50% of which was counseling/coordinating care for Port-a-Cath placement

## 2021-10-29 NOTE — Procedures (Signed)
Interventional Radiology Procedure:   Indications: Chest wall recurrence of breast cancer [facility])  Procedure: Port placement  Findings: Right jugular port, tip at SVC/RA junction  Complications: None     EBL: Minimal, less than 10 ml  Plan: Discharge in one hour.  Keep port site and incisions dry for at least 24 hours.     Tyliah Schlereth R. Anselm Pancoast, MD  Pager: [phone]

## 2021-10-29 NOTE — Discharge Instructions (Signed)
For questions /concerns may call Interventional Radiology at [phone] or  Interventional Radiology clinic [phone]   You may remove your dressing and shower tomorrow afternoon  DO NOT use EMLA cream for 2 weeks after port placement as the cream will remove surgical glue on your incision.   For questions /concerns may call Interventional Radiology at [phone] or  Interventional Radiology clinic [phone]   You may remove your dressing and shower tomorrow afternoon  DO NOT use EMLA cream for 2 weeks after port placement as the cream will remove surgical glue on your incision.      Implanted Port Insertion, Care After This sheet gives you information about how to care for yourself after your procedure. Your health care provider may also give you more specific instructions. If you have problems or questions, contact your health careprovider. What can I expect after the procedure? After the procedure, it is common to have: Discomfort at the port insertion site. Bruising on the skin over the port. This should improve over 3-4 days. Follow these instructions at home: [facility] care After your port is placed, you will get a manufacturer's information card. The card has information about your port. Keep this card with you at all times. Take care of the port as told by your health care provider. Ask your health care provider if you or a family member can get training for taking care of the port at home. A home health care nurse may also take care of the port. Make sure to remember what type of port you have. Incision care Follow instructions from your health care provider about how to take care of your port insertion site. Make sure you: Wash your hands with soap and water before and after you change your bandage (dressing). If soap and water are not available, use hand sanitizer. Change your dressing as told by your health care provider. Leave skin glue, or adhesive strips in place. These  skin closures may need to stay in place for 2 weeks or longer.  Check your port insertion site every day for signs of infection. Check for:      - Redness, swelling, or pain.                     - Fluid or blood.      - Warmth.      - Pus or a bad smell. Activity Return to your normal activities as told by your health care provider. Ask your health care provider what activities are safe for you. Do not lift anything that is heavier than 10 lb (4.5 kg), or the limit that you are told, until your health care provider says that it is safe. General instructions Take over-the-counter and prescription medicines only as told by your health care provider. Do not take baths, swim, or use a hot tub until your health care provider approves. Ask your health care provider if you may take showers. You may only be allowed to take sponge baths. Do not drive for 24 hours if you were given a sedative during your procedure. Wear a medical alert bracelet in case of an emergency. This will tell any health care providers that you have a port. Keep all follow-up visits as told by your health care provider. This is important. Contact a health care provider if: You cannot flush your port with saline as directed, or you cannot draw blood from the port. You have a fever or chills. You have redness, swelling,  or pain around your port insertion site. You have fluid or blood coming from your port insertion site. Your port insertion site feels warm to the touch. You have pus or a bad smell coming from the port insertion site. Get help right away if: You have chest pain or shortness of breath. You have bleeding from your port that you cannot control. Summary Take care of the port as told by your health care provider. Keep the manufacturer's information card with you at all times. Change your dressing as told by your health care provider. Contact a health care provider if you have a fever or chills or if you have  redness, swelling, or pain around your port insertion site. Keep all follow-up visits as told by your health care provider. This information is not intended to replace advice given to you by your health care provider. Make sure you discuss any questions you have with your healthcare provider.  Moderate Conscious Sedation, Adult, Care After This sheet gives you information about how to care for yourself after your procedure. Your health care provider may also give you more specific instructions. If you have problems or questions, contact your health careprovider. What can I expect after the procedure? After the procedure, it is common to have: Sleepiness for several hours. Impaired judgment for several hours. Difficulty with balance. Vomiting if you eat too soon. Follow these instructions at home: For the time period you were told by your health care provider: Rest. Do not participate in activities where you could fall or become injured. Do not drive or use machinery. Do not drink alcohol. Do not take sleeping pills or medicines that cause drowsiness. Do not make important decisions or sign legal documents. Do not take care of children on your own. Eating and drinking  Follow the diet recommended by your health care provider. Drink enough fluid to keep your urine pale yellow. If you vomit: Drink water, juice, or soup when you can drink without vomiting. Make sure you have little or no nausea before eating solid foods.  General instructions Take over-the-counter and prescription medicines only as told by your health care provider. Have a responsible adult stay with you for the time you are told. It is important to have someone help care for you until you are awake and alert. Do not smoke. Keep all follow-up visits as told by your health care provider. This is important. Contact a health care provider if: You are still sleepy or having trouble with balance after 24 hours. You feel  light-headed. You keep feeling nauseous or you keep vomiting. You develop a rash. You have a fever. You have redness or swelling around the IV site. Get help right away if: You have trouble breathing. You have new-onset confusion at home. Summary After the procedure, it is common to feel sleepy, have impaired judgment, or feel nauseous if you eat too soon. Rest after you get home. Know the things you should not do after the procedure. Follow the diet recommended by your health care provider and drink enough fluid to keep your urine pale yellow. Get help right away if you have trouble breathing or new-onset confusion at home. This information is not intended to replace advice given to you by your health care provider. Make sure you discuss any questions you have with your healthcare provider. Document Revised: 07/29/2019 Document Reviewed: 02/24/2019 Elsevier Patient Education  2022 [employer].

## 2021-11-04 ENCOUNTER — Other Ambulatory Visit: Payer: Self-pay

## 2021-11-12 ENCOUNTER — Other Ambulatory Visit: Payer: Self-pay

## 2021-11-14 ENCOUNTER — Encounter: Payer: Self-pay | Admitting: Hematology and Oncology

## 2021-11-14 ENCOUNTER — Other Ambulatory Visit: Payer: Self-pay

## 2021-11-14 ENCOUNTER — Inpatient Hospital Stay (HOSPITAL_BASED_OUTPATIENT_CLINIC_OR_DEPARTMENT_OTHER): Payer: Medicare Other | Admitting: Hematology and Oncology

## 2021-11-14 ENCOUNTER — Inpatient Hospital Stay: Payer: Medicare Other | Attending: Adult Health

## 2021-11-14 ENCOUNTER — Inpatient Hospital Stay: Payer: Medicare Other

## 2021-11-14 VITALS — BP 158/61 | HR 69 | Temp 97.4°F | Resp 18 | Ht 67.0 in | Wt 198.9 lb

## 2021-11-14 VITALS — BP 161/68 | HR 68 | Resp 18

## 2021-11-14 DIAGNOSIS — Z170 Estrogen receptor positive status [ER+]: Secondary | ICD-10-CM

## 2021-11-14 DIAGNOSIS — Z79810 Long term (current) use of selective estrogen receptor modulators (SERMs): Secondary | ICD-10-CM | POA: Diagnosis not present

## 2021-11-14 DIAGNOSIS — M8580 Other specified disorders of bone density and structure, unspecified site: Secondary | ICD-10-CM | POA: Diagnosis not present

## 2021-11-14 DIAGNOSIS — C7989 Secondary malignant neoplasm of other specified sites: Secondary | ICD-10-CM | POA: Diagnosis not present

## 2021-11-14 DIAGNOSIS — C50312 Malignant neoplasm of lower-inner quadrant of left female breast: Secondary | ICD-10-CM

## 2021-11-14 DIAGNOSIS — C50919 Malignant neoplasm of unspecified site of unspecified female breast: Secondary | ICD-10-CM | POA: Diagnosis not present

## 2021-11-14 DIAGNOSIS — Z5111 Encounter for antineoplastic chemotherapy: Secondary | ICD-10-CM | POA: Diagnosis present

## 2021-11-14 DIAGNOSIS — C773 Secondary and unspecified malignant neoplasm of axilla and upper limb lymph nodes: Secondary | ICD-10-CM | POA: Diagnosis not present

## 2021-11-14 DIAGNOSIS — Z5112 Encounter for antineoplastic immunotherapy: Secondary | ICD-10-CM | POA: Insufficient documentation

## 2021-11-14 LAB — CMP (CANCER CENTER ONLY)
ALT: 9 U/L (ref 0–44)
AST: 15 U/L (ref 15–41)
Albumin: 4.3 g/dL (ref 3.5–5.0)
Alkaline Phosphatase: 54 U/L (ref 38–126)
Anion gap: 9 (ref 5–15)
BUN: 19 mg/dL (ref 8–23)
CO2: 25 mmol/L (ref 22–32)
Calcium: 9.2 mg/dL (ref 8.9–10.3)
Chloride: 105 mmol/L (ref 98–111)
Creatinine: 0.73 mg/dL (ref 0.44–1.00)
GFR, Estimated: >60 mL/min (ref >60)
Glucose, Bld: 147 mg/dL — ABNORMAL HIGH (ref 70–99)
Potassium: 3.8 mmol/L (ref 3.5–5.1)
Sodium: 139 mmol/L (ref 135–145)
Total Bilirubin: 0.5 mg/dL (ref 0.3–1.2)
Total Protein: 7.3 g/dL (ref 6.5–8.1)

## 2021-11-14 LAB — CBC WITH DIFFERENTIAL (CANCER CENTER ONLY)
Abs Immature Granulocytes: 0.01 10*3/uL (ref 0.00–0.07)
Basophils Absolute: 0 10*3/uL (ref 0.0–0.1)
Basophils Relative: 1 %
Eosinophils Absolute: 0.2 10*3/uL (ref 0.0–0.5)
Eosinophils Relative: 4 %
HCT: 40 % (ref 36.0–46.0)
Hemoglobin: 12.6 g/dL (ref 12.0–15.0)
Immature Granulocytes: 0 %
Lymphocytes Relative: 28 %
Lymphs Abs: 1.6 10*3/uL (ref 0.7–4.0)
MCH: 25 pg — ABNORMAL LOW (ref 26.0–34.0)
MCHC: 31.5 g/dL (ref 30.0–36.0)
MCV: 79.4 fL — ABNORMAL LOW (ref 80.0–100.0)
Monocytes Absolute: 0.3 10*3/uL (ref 0.1–1.0)
Monocytes Relative: 6 %
Neutro Abs: 3.5 10*3/uL (ref 1.7–7.7)
Neutrophils Relative %: 61 %
Platelet Count: 238 10*3/uL (ref 150–400)
RBC: 5.04 MIL/uL (ref 3.87–5.11)
RDW: 15.9 % — ABNORMAL HIGH (ref 11.5–15.5)
WBC Count: 5.7 10*3/uL (ref 4.0–10.5)
nRBC: 0 % (ref 0.0–0.2)

## 2021-11-14 MED ORDER — DIPHENHYDRAMINE HCL 25 MG PO CAPS
50.0000 mg | ORAL_CAPSULE | Freq: Once | ORAL | Status: AC
  Administered 2021-11-14: 50 mg via ORAL
  Filled 2021-11-14: qty 2

## 2021-11-14 MED ORDER — FULVESTRANT 250 MG/5ML IM SOSY
500.0000 mg | PREFILLED_SYRINGE | INTRAMUSCULAR | Status: DC
  Administered 2021-11-14: 500 mg via INTRAMUSCULAR
  Filled 2021-11-14: qty 10

## 2021-11-14 MED ORDER — HEPARIN SOD (PORK) LOCK FLUSH 100 UNIT/ML IV SOLN
500.0000 [IU] | Freq: Once | INTRAVENOUS | Status: AC | PRN
  Administered 2021-11-14: 500 [IU]

## 2021-11-14 MED ORDER — SODIUM CHLORIDE 0.9 % IV SOLN: Freq: Once | INTRAVENOUS | Status: AC

## 2021-11-14 MED ORDER — SODIUM CHLORIDE 0.9% FLUSH
10.0000 mL | INTRAVENOUS | Status: DC | PRN
  Administered 2021-11-14: 10 mL

## 2021-11-14 MED ORDER — ACETAMINOPHEN 325 MG PO TABS
650.0000 mg | ORAL_TABLET | Freq: Once | ORAL | Status: AC
  Administered 2021-11-14: 650 mg via ORAL
  Filled 2021-11-14: qty 2

## 2021-11-14 MED ORDER — TRASTUZUMAB-ANNS CHEMO 150 MG IV SOLR
6.0000 mg/kg | Freq: Once | INTRAVENOUS | Status: AC
  Administered 2021-11-14: 588 mg via INTRAVENOUS
  Filled 2021-11-14: qty 28

## 2021-11-14 NOTE — Patient Instructions (Signed)
[facility] MEDICAL ONCOLOGY  Discharge Instructions: Thank you for choosing [facility] to provide your oncology and hematology care.   If you have a lab appointment with the Cancer Center, please go directly to the Cancer Center and check in at the registration area.   Wear comfortable clothing and clothing appropriate for easy access to any Portacath or PICC line.   We strive to give you quality time with your provider. You may need to reschedule your appointment if you arrive late (15 or more minutes).  Arriving late affects you and other patients whose appointments are after yours.  Also, if you miss three or more appointments without notifying the office, you may be dismissed from the clinic at the provider's discretion.      For prescription refill requests, have your pharmacy contact our office and allow 72 hours for refills to be completed.    Today you received the following chemotherapy and/or immunotherapy agents: Kanjinti      To help prevent nausea and vomiting after your treatment, we encourage you to take your nausea medication as directed.  BELOW ARE SYMPTOMS THAT SHOULD BE REPORTED IMMEDIATELY: *FEVER GREATER THAN 100.4 F (38 C) OR HIGHER *CHILLS OR SWEATING *NAUSEA AND VOMITING THAT IS NOT CONTROLLED WITH YOUR NAUSEA MEDICATION *UNUSUAL SHORTNESS OF BREATH *UNUSUAL BRUISING OR BLEEDING *URINARY PROBLEMS (pain or burning when urinating, or frequent urination) *BOWEL PROBLEMS (unusual diarrhea, constipation, pain near the anus) TENDERNESS IN MOUTH AND THROAT WITH OR WITHOUT PRESENCE OF ULCERS (sore throat, sores in mouth, or a toothache) UNUSUAL RASH, SWELLING OR PAIN  UNUSUAL VAGINAL DISCHARGE OR ITCHING   Items with * indicate a potential emergency and should be followed up as soon as possible or go to the Emergency Department if any problems should occur.  Please show the CHEMOTHERAPY ALERT CARD or IMMUNOTHERAPY ALERT CARD at check-in to  the Emergency Department and triage nurse.  Should you have questions after your visit or need to cancel or reschedule your appointment, please contact [facility] MEDICAL ONCOLOGY  Dept: [phone]  and follow the prompts.  Office hours are 8:00 a.m. to 4:30 p.m. Monday - Friday. Please note that voicemails left after 4:00 p.m. may not be returned until the following business day.  We are closed weekends and major holidays. You have access to a nurse at all times for urgent questions. Please call the main number to the clinic Dept: [phone] and follow the prompts.   For any non-urgent questions, you may also contact your provider using MyChart. We now offer e-Visits for anyone 18 and older to request care online for non-urgent symptoms. For details visit [email or web address].   Also download the MyChart app! Go to the app store, search "MyChart", open the app, select , and log in with your MyChart username and password.  Masks are optional in the cancer centers. If you would like for your care team to wear a mask while they are taking care of you, please let them know. You may have one support person who is at least 86 years old accompany you for your appointments. 

## 2021-11-14 NOTE — Progress Notes (Signed)
Tami Schmidt:    Tami Han, MD (Inactive) No address on file   DIAGNOSIS:  Cancer Staging  Chest wall recurrence of breast cancer [facility]) Staging form: Breast, AJCC 7th Edition - Clinical: Stage Unknown ([place], N1, M0) - Signed by Chauncey Cruel, MD on 07/27/2014  Malignant neoplasm of lower-inner quadrant of left breast in female, estrogen receptor positive ([place]) Staging form: Breast, AJCC 7th Edition - Clinical: Stage IA (T1b, N0, M0) - Signed by Chauncey Cruel, MD on 07/27/2014   SUMMARY OF ONCOLOGIC HISTORY:  88 y.o. [place] woman   (1) status post right lumpectomy and axillary dissection in 1993 in [place] for what appears to have been a stage I breast cancer, treated with radiation and then tamoxifen for 5 years   (2) status post right mastectomy 2002 for a recurrence in the right breast, treated adjuvantly with tamoxifen for an additional 5 years   (3) status post left mastectomy and sentinel lymph node sampling 03/07/2003 for a lower inner quadrant pT1b, pN0, stage IA mucinous breast cancer, grade 2; no radiation therapy was given. Tamoxifen was continued until 2009   RECURRENT DISEASE/ LEFT: OCTOBER 2014 (4) excisional biopsy of a left chest wall mass 01/26/2013 showing carcinoma consistent with invasive ductal carcinoma, estrogen receptor 100% positive, progesterone receptor 0% positive, with an MIB-1 of 22% and no HER-2 amplification.    (5)  PET scan on 03/03/2013 confirmed extensive metastatic adenopathy in the left axilla, left subpectoral musculature, and left supraclavicular region   (6)  started on anastrozole daily beginning 03/01/2013, interrupted during radiation therapy, resumed March 2015, discontinued March 2016 because of fatigue and arthralgias   (7) radiation therapy 03/31/2013 through 05/23/2013 Site/dose:   The patient was treated initially with a forward treatment planning technique to the left chest wall  in addition to treatment to the supraclavicular region. This consisted of a 3-D conformal technique. The patient was treated in this fashion to a dose of 50.4 gray. The patient then received a 14 gray boost treatment using an electron field. The total dose was 64.4 gray.   (8) Osteopenia, zometa yearly started 07/27/2014, but poorly tolerated.              (a) bone density scan on 12/27/13 showed a t-score of -1.8              (b) started Denosumab/Prolia April 2017, canceled after one dose per patient   (9) tamoxifen started 07/27/2014, stopped November 2019 (had total 5 years of antiestrogens)   (10) likely thalassemia trait   (11) left subpectoral soft tissue nodule noted 07/17/2015 unchanged through serial scans, most recent 20/80/2233, with no metabolic activity noted in PET scan of 2016 and interval calcification.  This is presumed benign   (12)  ADDITIONAL REGIONAL RECURRENCE:             (A) CT of the chest with contrast 03/05/2021 obtained to evaluate left upper extremity lymphedema as showed acute pulmonary emboli.  There was also left axillary and subpectoral lymphadenopathy             (B) rivaroxaban started 03/05/2021             (C) left breast ultrasound 03/29/2021 shows 2 areas in the infraclavicular tissue consistent with fat necrosis.  There were however multiple retropectoral interpectoral and left axillary masses.             (D) left axillary lymph node biopsy 04/04/2021: IDC  grade 3, ER positive, 100%, strong staining intensity, PR positive, 20%, weak staining intensity, Ki-67 of 15%, HER2 positive by IHC, ratio 2.90             (E) Fulvestrant every 4 weeks beginning 04/18/2021             (F) Herceptin every 3 weeks beginning 05/30/2021  Later changed both to every 4 weeks for better appointment schedule and patient convenience.    CURRENT THERAPY: Trastuzumab & Fulvestrant  INTERVAL HISTORY:  Tami Schmidt 88 y.o. female returns for follow-Schmidt of her estrogen  positive progesterone positive and HER2 positive breast cancer recurrence diagnosed in November 2022.    She is receiving herceptin and fulvestrant every 4 weeks. She feels fatigued, eating less, losing a little weight. No cough, chest pain, SOB. Has constipation at baseline, takes some herb solution. No new bone pains. No headaches,falls. She is still independent, does all her ADL's. Rest of the pertinent 10 point ROS reviewed and negative.   Patient Active Problem List   Diagnosis Date Noted   Genetic testing 07/24/2021   Microcytosis 04/29/2021   Hypokalemia 04/28/2021   SBO (small bowel obstruction) ([place]) 04/27/2021   Pulmonary embolism ([place])    Agnosia 08/09/2020   Hardening of the aorta (main artery of the heart) ([place]) 08/09/2020   Morbid obesity ([place]) 08/09/2020   Neuropathy 08/09/2020   Primary osteoarthritis 08/09/2020   Sensorineural hearing loss (SNHL) of both ears 08/07/2020   Anosmia 06/05/2020   Tinnitus, bilateral 06/05/2020   GI bleed 07/13/2019   Acute blood loss anemia 07/12/2019   Microcytic anemia 01/05/2018   Melena 01/04/2018   Lower GI bleed 01/04/2018   Type 2 diabetes mellitus without complication ([place]) 16/01/9603   Bacteremia due to methicillin susceptible Staphylococcus aureus (MSSA) 12/02/2017   Tenosynovitis of left wrist 12/02/2017   Infection of left wrist ([place]) 11/30/2017   Primary osteoarthritis of right hip 01/05/2017   Osteoarthritis of right hip 01/03/2017   Lymphedema of upper extremity 01/17/2014   Osteopenia 01/17/2014   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   Dilated pore of [place] 08/02/2013   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[place]) 03/25/2013   Diabetes mellitus    fasting 90-100   HTN (hypertension) 03/25/2013   Hx of radiation therapy    breasts hx   Hypercholesterolemia    Hypertension    Lymphedema of arm    left arm   Pulmonary embolism ([place])    Type II or unspecified type diabetes mellitus without mention of complication, not stated as uncontrolled 03/25/2013    SURGICAL HISTORY: Past Surgical History:  Procedure Laterality Date   ABDOMINAL HYSTERECTOMY     ANKLE SURGERY Right 1995   APPENDECTOMY     BIOPSY  01/07/2018   Procedure: BIOPSY;  Surgeon: [name], MD;  Location: [facility];  Service: Gastroenterology;;   BREAST SURGERY Bilateral    mastectomy   COLONOSCOPY WITH PROPOFOL N/A 01/07/2018   Procedure: COLONOSCOPY WITH PROPOFOL;  Surgeon: [name], MD;  Location: [facility];  Service: Gastroenterology;  Laterality: N/A;   ESOPHAGOGASTRODUODENOSCOPY (EGD) WITH PROPOFOL N/A 01/06/2018   Procedure: ESOPHAGOGASTRODUODENOSCOPY (EGD) WITH PROPOFOL;  Surgeon: [name], MD;  Location: [facility];  Service: Gastroenterology;  Laterality: N/A;   HERNIA REPAIR  04-26-2010   IR IMAGING GUIDED PORT INSERTION  10/29/2021   MASS EXCISION Left 01/26/2013   Procedure: EXCISION LEFT CHEST WALL MASS AND LEFT ABDOMNAL WALL MASS;  Surgeon: Harl Bowie, MD;  Location: [place];  Service: General;  Laterality: Left;   MASS EXCISION Left 09/20/2014   Procedure: EXCISION OF LEFT CHEST WALL MASS;  Surgeon: Coralie Keens, MD;  Location: [place];  Service: General;  Laterality: Left;   TEE  WITHOUT CARDIOVERSION N/A 12/08/2017   Procedure: TRANSESOPHAGEAL ECHOCARDIOGRAM (TEE);  Surgeon: Lelon Perla, MD;  Location: [place];  Service: Cardiovascular;  Laterality: N/A;   TOTAL HIP ARTHROPLASTY Right 01/05/2017   TOTAL HIP ARTHROPLASTY Right 01/05/2017   Procedure: TOTAL HIP ARTHROPLASTY ANTERIOR APPROACH;  Surgeon: Frederik Pear, MD;  Location: [place];  Service: Orthopedics;  Laterality: Right;    SOCIAL HISTORY: Social History   Socioeconomic History   Marital status: Married    Spouse name: Not on file   Number of children: Not on file   Years of education: Not on file   Highest education level: Not on file  Occupational History   Occupation: retired Education officer, museum  Tobacco Use   Smoking status: Former    Types: Cigarettes    Quit date: 04/14/1972    Years since quitting: 49.6   Smokeless tobacco: Never  Vaping Use   Vaping Use: Never used  Substance and Sexual Activity   Alcohol use: Yes    Comment: occasional   Drug use: Yes   Sexual activity: Yes    Birth control/protection: Surgical  Other Topics Concern   Not on file  Social History Narrative   Not on file   Social Determinants of Health   Financial Resource Strain: Not on file  Food Insecurity: Not on file  Transportation Needs: Not on file  Physical Activity: Not on file  Stress: Not on file  Social Connections: Not on file  Intimate Partner Violence: Not on file    FAMILY HISTORY: Family History  Problem Relation Age of Onset   Breast cancer Cousin        maternal first cousin   Uterine cancer Cousin        maternal first cousin    Review of Systems  Constitutional:  Negative for appetite change, chills, fatigue, fever and unexpected weight change.  HENT:   Negative for hearing loss, lump/mass and trouble swallowing.   Eyes:  Negative for eye problems and icterus.  Respiratory:  Negative for chest tightness, cough and shortness of breath.   Cardiovascular:  Negative for chest  pain, leg swelling and palpitations.  Gastrointestinal:  Negative for abdominal distention, abdominal pain, constipation, diarrhea, nausea and vomiting.  Endocrine: Negative for hot flashes.  Genitourinary:  Negative for difficulty urinating.   Musculoskeletal:  Negative for arthralgias.  Skin:  Negative for itching and rash.  Neurological:  Negative for dizziness, extremity weakness, headaches and numbness.  Hematological:  Negative for adenopathy. Does not bruise/bleed easily.  Psychiatric/Behavioral:  Negative for depression. The patient is  not nervous/anxious.      PHYSICAL EXAMINATION  ECOG PERFORMANCE STATUS: 1 - Symptomatic but completely ambulatory  Vitals:   11/14/21 1242  BP: (!) 158/61  Pulse: 69  Resp: 18  Temp: (!) 97.4 F (36.3 C)  SpO2: 100%     Physical Exam Constitutional:      General: She is not in acute distress.    Appearance: Normal appearance. She is not toxic-appearing.  HENT:     Head: Normocephalic and atraumatic.  Eyes:     General: No scleral icterus. Cardiovascular:     Rate and Rhythm: Normal rate and regular rhythm.     Pulses: Normal pulses.     Heart sounds: Normal heart sounds.  Pulmonary:     Effort: Pulmonary effort is normal.     Breath sounds: Normal breath sounds.  Chest:       Comments: Abnormal density in marked area. No significant change.  Abdominal:     General: Abdomen is flat. Bowel sounds are normal. There is no distension.     Palpations: Abdomen is soft.     Tenderness: There is no abdominal tenderness.  Musculoskeletal:        General: Swelling (Left upper extremity swelling) present.     Cervical back: Neck supple.  Lymphadenopathy:     Cervical: No cervical adenopathy.  Skin:    General: Skin is warm and dry.     Findings: No rash.     Comments: Skin nodules do not demonstrate any change since last visit  Neurological:     General: No focal deficit present.     Mental Status: She is alert.  Psychiatric:         Mood and Affect: Mood normal.        Behavior: Behavior normal.     LABORATORY DATA:  CBC    Component Value Date/Time   WBC 5.7 11/14/2021 1221   WBC 6.5 04/30/2021 0334   RBC 5.04 11/14/2021 1221   HGB 12.6 11/14/2021 1221   HGB 10.5 (L) 01/20/2017 1057   HCT 40.0 11/14/2021 1221   HCT 25.8 (L) 01/05/2018 0513   HCT 32.8 (L) 01/20/2017 1057   PLT 238 11/14/2021 1221   PLT 368 01/20/2017 1057   MCV 79.4 (L) 11/14/2021 1221   MCV 77.8 (L) 01/20/2017 1057   MCH 25.0 (L) 11/14/2021 1221   MCHC 31.5 11/14/2021 1221   RDW 15.9 (H) 11/14/2021 1221   RDW 16.0 (H) 01/20/2017 1057   LYMPHSABS 1.6 11/14/2021 1221   LYMPHSABS 1.3 01/20/2017 1057   MONOABS 0.3 11/14/2021 1221   MONOABS 0.4 01/20/2017 1057   EOSABS 0.2 11/14/2021 1221   EOSABS 0.1 01/20/2017 1057   BASOSABS 0.0 11/14/2021 1221   BASOSABS 0.1 01/20/2017 1057    CMP     Component Value Date/Time   NA 140 10/17/2021 1321   NA 139 01/20/2017 1057   K 3.6 10/17/2021 1321   K 3.9 01/20/2017 1057   CL 105 10/17/2021 1321   CO2 25 10/17/2021 1321   CO2 25 01/20/2017 1057   GLUCOSE 187 (H) 10/17/2021 1321   GLUCOSE 135 01/20/2017 1057   BUN 20 10/17/2021 1321   BUN 15.6 01/20/2017 1057   CREATININE 0.81 10/17/2021 1321   CREATININE 0.7 01/20/2017 1057   CALCIUM 9.4 10/17/2021 1321   CALCIUM 9.3 01/20/2017 1057   PROT 7.4 10/17/2021 1321   PROT 7.1 01/20/2017 1057   ALBUMIN 4.2 10/17/2021 1321   ALBUMIN 3.4 (L) 01/20/2017  1057   AST 11 (L) 10/17/2021 1321   AST 10 01/20/2017 1057   ALT 9 10/17/2021 1321   ALT 14 01/20/2017 1057   ALKPHOS 62 10/17/2021 1321   ALKPHOS 69 01/20/2017 1057   BILITOT 0.5 10/17/2021 1321   BILITOT 0.43 01/20/2017 1057   GFRNONAA >60 10/17/2021 1321   GFRAA >60 07/14/2019 0538     ASSESSMENT and THERAPY PLAN:   1) status post right lumpectomy and axillary dissection in 1993 in [place] for what appears to have been a stage I breast cancer, treated with radiation and  then tamoxifen for 5 years   (2) status post right mastectomy 2002 for a recurrence in the right breast, treated adjuvantly with tamoxifen for an additional 5 years   (3) status post left mastectomy and sentinel lymph node sampling 03/07/2003 for a lower inner quadrant pT1b, pN0, stage IA mucinous breast cancer, grade 2; no radiation therapy was given. Tamoxifen was continued until 2009   RECURRENT DISEASE/ LEFT: OCTOBER 2014 (4) excisional biopsy of a left chest wall mass 01/26/2013 showing carcinoma consistent with invasive ductal carcinoma, estrogen receptor 100% positive, progesterone receptor 0% positive, with an MIB-1 of 22% and no HER-2 amplification.    (5)  PET scan on 03/03/2013 confirmed extensive metastatic adenopathy in the left axilla, left subpectoral musculature, and left supraclavicular region   (6)  started on anastrozole daily beginning 03/01/2013, interrupted during radiation therapy, resumed March 2015, discontinued March 2016 because of fatigue and arthralgias   (7) radiation therapy 03/31/2013 through 05/23/2013 Site/dose:   The patient was treated initially with a forward treatment planning technique to the left chest wall in addition to treatment to the supraclavicular region. This consisted of a 3-D conformal technique. The patient was treated in this fashion to a dose of 50.4 gray. The patient then received a 14 gray boost treatment using an electron field. The total dose was 64.4 gray.   (8) Osteopenia, zometa yearly started 07/27/2014, but poorly tolerated.              (a) bone density scan on 12/27/13 showed a t-score of -1.8              (b) started Denosumab/Prolia April 2017, canceled after one dose per patient   (9) tamoxifen started 07/27/2014, stopped November 2019 (had total 5 years of antiestrogens)   (10) likely thalassemia trait   (11) left subpectoral soft tissue nodule noted 07/17/2015 unchanged through serial scans, most recent 02/09/2018, with no  metabolic activity noted in PET scan of 2016 and interval calcification.  This is presumed benign   (12)  ADDITIONAL REGIONAL RECURRENCE:             (A) CT of the chest with contrast 03/05/2021 obtained to evaluate left upper extremity lymphedema as showed acute pulmonary emboli.  There was also left axillary and subpectoral lymphadenopathy             (B) rivaroxaban started 03/05/2021             (C) left breast ultrasound 03/29/2021 shows 2 areas in the infraclavicular tissue consistent with fat necrosis.  There were however multiple retropectoral interpectoral and left axillary masses.             (D) left axillary lymph node biopsy 04/04/2021   She had biopsy of the left axillary lymph node mass which showed invasive ductal carcinoma, grade 3, ER positive, 100%, strong staining intensity, PR positive, 20%, weak staining intensity, Ki-67  of 15%, HER2 positive by IHC, ratio 2.90 She was initially scheduled for fulvestrant however given her to positive breast cancer, we have discussed about additional therapy today.  CT chest abdomen pelvis showed left axillary, left subpectoral, left supraclavicular and mediastinal adenopathy which is stable compared to November.  Scattered tiny pulmonary nodules, stable, hepatic steatosis.  Bone scan without any evidence of metastatic disease.  She was started on Herceptin along with Faslodex for treatment of local recurrence. Skin biopsy over left chest wall, benign   PLAN:   She is on herceptin and faslodex every 28 days, more for convenience since Herceptin is every 21 days and Faslodex every 28 days, she had to deal with multiple appointments Recent imaging stable disease. Most recent ECHO, stable EF I do not believe she is a candidate for aggressive treatment given her age and comorbidities. Continue current treatment and we will repeat scan in September. Okay to proceed with Herceptin and fulvestrant as planned. Repeat imaging for September has  been ordered.  Total encounter time: 30 minutes in face-to-face visit time, chart review, lab review, care coordination, order entry, and documentation of the encounter.  *Total Encounter Time as defined by the Centers for Medicare and Medicaid Services includes, in addition to the face-to-face time of a patient visit (documented in the note above) non-face-to-face time: obtaining and reviewing outside history, ordering and reviewing medications, tests or procedures, care coordination (communications with other health care professionals or caregivers) and documentation in the medical record.

## 2021-11-15 ENCOUNTER — Telehealth: Payer: Self-pay | Admitting: Hematology and Oncology

## 2021-11-15 LAB — CANCER ANTIGEN 27.29: CA 27.29: 39.1 U/mL — ABNORMAL HIGH (ref 0.0–38.6)

## 2021-11-15 NOTE — Telephone Encounter (Signed)
Rescheduled appointment per 8/3 los. Patient is aware of the changes made to her upcoming appointment.

## 2021-12-02 ENCOUNTER — Ambulatory Visit (INDEPENDENT_AMBULATORY_CARE_PROVIDER_SITE_OTHER): Payer: Medicare Other | Admitting: Podiatry

## 2021-12-02 ENCOUNTER — Encounter: Payer: Self-pay | Admitting: Podiatry

## 2021-12-02 DIAGNOSIS — Q828 Other specified congenital malformations of skin: Secondary | ICD-10-CM

## 2021-12-02 DIAGNOSIS — B351 Tinea unguium: Secondary | ICD-10-CM | POA: Diagnosis not present

## 2021-12-02 DIAGNOSIS — E1142 Type 2 diabetes mellitus with diabetic polyneuropathy: Secondary | ICD-10-CM | POA: Diagnosis not present

## 2021-12-02 DIAGNOSIS — M79675 Pain in left toe(s): Secondary | ICD-10-CM

## 2021-12-02 DIAGNOSIS — L84 Corns and callosities: Secondary | ICD-10-CM

## 2021-12-02 DIAGNOSIS — I739 Peripheral vascular disease, unspecified: Secondary | ICD-10-CM | POA: Diagnosis not present

## 2021-12-02 DIAGNOSIS — M79674 Pain in right toe(s): Secondary | ICD-10-CM | POA: Diagnosis not present

## 2021-12-04 ENCOUNTER — Other Ambulatory Visit: Payer: Self-pay

## 2021-12-09 NOTE — Progress Notes (Signed)
  Subjective:  Patient ID: Tami Schmidt, female    DOB: [birth year],  MRN: [id number]  Tami Schmidt presents to clinic today for at risk footcare. Patient has h/o diabetes, neuropathy and PAD and is seen for  and corn(s) b/l lower extremities, callus(es) b/l lower extremities, porokeratotic lesion(s) left lower extremity,and painful mycotic nails. Painful toenails interfere with ambulation. Aggravating factors include wearing enclosed shoe gear. Pain is relieved with periodic professional debridement. Painful corns, callus(es) and porokeratotic lesion(s) are aggravated when weightbearing with and without shoegear. Pain is relieved with periodic professional debridement.  Patient states blood glucose was 111 mg/dl today.  Last known  HgA1c was 6.0%.    New problem(s): None.   PCP is [name], MD (Inactive) , and last visit was  August, 2022.  Allergies  Allergen Reactions   Bactrim Swelling    SWELLING OF MOUTH/FACE.   Lisinopril Swelling    SWELLING OF MOUTH/FACE.   Vasotec Swelling    SWELLING OF MOUTH/FACE.   Sulfamethoxazole-Trimethoprim     Other reaction(s): Unknown   Review of Systems: Negative except as noted in the HPI.  Objective: No changes noted in today's physical examination. Tami Schmidt is a pleasant 88 y.o. y.o. female in NAD. AAO x 3.  Vascular Examination: CFT <3 seconds b/l. DP pulses faintly palpable b/l. PT pulses nonpalpable b/l. Digital hair absent. Skin temperature gradient warm to warm b/l. No pain with calf compression. No ischemia or gangrene. No cyanosis or clubbing noted b/l.    Neurological Examination: Sensation grossly intact b/l with 10 gram monofilament. Vibratory sensation diminished b/l.  Dermatological Examination: Pedal skin warm and supple b/l. Toenails 1-5 b/l thick, discolored, elongated with subungual debris and pain on dorsal palpation.  Hyperkeratotic lesion(s) dorsal PIPJ of bilateral 5th toes, submet head 5 right  foot, and sub 5th met base left lower extremity.  No erythema, no edema, no drainage, no fluctuance.  Musculoskeletal Examination: Muscle strength 5/5 to b/l LE. HAV with bunion bilaterally and hammertoes 2-5 b/l. Pes planus deformity noted bilateral LE.  Radiographs: None  Assessment/Plan: 1. Pain due to onychomycosis of toenails of both feet   2. Corns and callosities   3. Porokeratosis   4. Diabetic peripheral neuropathy associated with type 2 diabetes mellitus ([place])   5. PVD (peripheral vascular disease) ([place])   -Examined patient. -No new findings. No new orders. -Mycotic toenails 1-5 bilaterally were debrided in length and girth with sterile nail nippers and dremel without incident. -Corn(s) bilateral 5th toes pared utilizing sterile scalpel blade without complication or incident. Total number debrided=2. -Callus(es) sub 5th met base left lower extremity pared utilizing sterile scalpel blade without complication or incident. Total number debrided =1. -Porokeratotic lesion(s) submet head 5 right foot pared and enucleated with sterile scalpel blade without incident. Total number of lesions debrided=1. -Patient/POA to call should there be question/concern in the interim.   Return in about 3 months (around 03/04/2022).  Marzetta Board, DPM

## 2021-12-10 ENCOUNTER — Encounter (HOSPITAL_BASED_OUTPATIENT_CLINIC_OR_DEPARTMENT_OTHER): Payer: Self-pay

## 2021-12-10 ENCOUNTER — Emergency Department (HOSPITAL_BASED_OUTPATIENT_CLINIC_OR_DEPARTMENT_OTHER)
Admission: EM | Admit: 2021-12-10 | Discharge: 2021-12-10 | Disposition: A | Discharge status: Home / Self Care | Payer: Medicare Other | Attending: Emergency Medicine | Admitting: Emergency Medicine

## 2021-12-10 ENCOUNTER — Other Ambulatory Visit: Payer: Self-pay

## 2021-12-10 DIAGNOSIS — Z7901 Long term (current) use of anticoagulants: Secondary | ICD-10-CM | POA: Diagnosis not present

## 2021-12-10 DIAGNOSIS — M5441 Lumbago with sciatica, right side: Secondary | ICD-10-CM | POA: Insufficient documentation

## 2021-12-10 DIAGNOSIS — M545 Low back pain, unspecified: Secondary | ICD-10-CM | POA: Diagnosis present

## 2021-12-10 LAB — CBC
HCT: 43.1 % (ref 36.0–46.0)
Hemoglobin: 13.8 g/dL (ref 12.0–15.0)
MCH: 25.3 pg — ABNORMAL LOW (ref 26.0–34.0)
MCHC: 32 g/dL (ref 30.0–36.0)
MCV: 79.1 fL — ABNORMAL LOW (ref 80.0–100.0)
Platelets: 201 10*3/uL (ref 150–400)
RBC: 5.45 MIL/uL — ABNORMAL HIGH (ref 3.87–5.11)
RDW: 16.5 % — ABNORMAL HIGH (ref 11.5–15.5)
WBC: 6.3 10*3/uL (ref 4.0–10.5)

## 2021-12-10 LAB — BASIC METABOLIC PANEL
Anion gap: 13 (ref 5–15)
BUN: 17 mg/dL (ref 8–23)
CO2: 23 mmol/L (ref 22–32)
Calcium: 9.7 mg/dL (ref 8.9–10.3)
Chloride: 101 mmol/L (ref 98–111)
Creatinine, Ser: 0.67 mg/dL (ref 0.44–1.00)
GFR, Estimated: >60 mL/min (ref >60)
Glucose, Bld: 107 mg/dL — ABNORMAL HIGH (ref 70–99)
Potassium: 5.6 mmol/L — ABNORMAL HIGH (ref 3.5–5.1)
Sodium: 137 mmol/L (ref 135–145)

## 2021-12-10 MED ORDER — OXYCODONE HCL 5 MG PO TABS
5.0000 mg | ORAL_TABLET | Freq: Once | ORAL | Status: AC
  Administered 2021-12-10: 5 mg via ORAL
  Filled 2021-12-10: qty 1

## 2021-12-10 MED ORDER — LIDOCAINE 5 % EX PTCH
1.0000 | MEDICATED_PATCH | CUTANEOUS | Status: DC
  Administered 2021-12-10: 1 via TRANSDERMAL
  Filled 2021-12-10: qty 1

## 2021-12-10 NOTE — ED Triage Notes (Signed)
Patient here POV from Home.  Endorses Right Lower Back Pain that began 2 Days ago with No Known Trauma or Injury. Responsive to Ibuprofen.  No Dysuria. No Fevers.   NAD Noted during Triage.A&Ox4. GCS 15. BIB Wheelchair.

## 2021-12-10 NOTE — ED Provider Notes (Signed)
[facility] EMERGENCY DEPT Provider Note   CSN: [id number] Arrival date & time: 12/10/21  1259     History Chief Complaint  Patient presents with   Flank Pain    HPI Tami Schmidt is a 88 y.o. female presenting for right-sided back pain.  She states that started proximately 4 days ago and has been intermittent in nature.  She states that it bothers her mostly in the morning and then resolves into the day.  She denies fevers or chills nausea or vomiting compassing shortness of breath which is ambulatory tolerating p.o. intake.  She denies any dysuria or pain on urination..   Patient's recorded medical, surgical, social, medication list and allergies were reviewed in the Snapshot window as part of the initial history.   Review of Systems   Review of Systems  Constitutional:  Negative for chills and fever.  HENT:  Negative for ear pain and sore throat.   Eyes:  Negative for pain and visual disturbance.  Respiratory:  Negative for cough and shortness of breath.   Cardiovascular:  Negative for chest pain and palpitations.  Gastrointestinal:  Negative for abdominal pain and vomiting.  Genitourinary:  Negative for dysuria and hematuria.  Musculoskeletal:  Negative for arthralgias and back pain.  Skin:  Negative for color change and rash.  Neurological:  Negative for seizures and syncope.  All other systems reviewed and are negative.   Physical Exam Updated Vital Signs BP (!) 163/76   Pulse 85   Temp 97.9 F (36.6 C) (Oral)   Resp 15   Ht '5\' 7"'$  (1.702 m)   Wt 90.2 kg   SpO2 99%   BMI 31.15 kg/m  Physical Exam Vitals and nursing note reviewed.  Constitutional:      General: She is not in acute distress.    Appearance: She is well-developed.  HENT:     Head: Normocephalic and atraumatic.  Eyes:     Conjunctiva/sclera: Conjunctivae normal.  Cardiovascular:     Rate and Rhythm: Normal rate and regular rhythm.     Heart sounds: No murmur heard. Pulmonary:      Effort: Pulmonary effort is normal. No respiratory distress.     Breath sounds: Normal breath sounds.  Abdominal:     General: There is no distension.     Palpations: Abdomen is soft.     Tenderness: There is no abdominal tenderness. There is no right CVA tenderness or left CVA tenderness.  Musculoskeletal:        General: No swelling or tenderness. Normal range of motion.     Cervical back: Neck supple.  Skin:    General: Skin is warm and dry.  Neurological:     General: No focal deficit present.     Mental Status: She is alert and oriented to person, place, and time. Mental status is at baseline.     Cranial Nerves: No cranial nerve deficit.      ED Course/ Medical Decision Making/ A&P    Procedures Procedures   Medications Ordered in ED Medications  lidocaine (LIDODERM) 5 % 1 patch (1 patch Transdermal Patch Applied 12/10/21 1540)  oxyCODONE (Oxy IR/ROXICODONE) immediate release tablet 5 mg (5 mg Oral Given 12/10/21 1540)   Medical Decision Making:   Tami Schmidt is a 88 y.o. female who presented to the ED today with acute lower back pain over the past 72 hours, detailed above.    Patient's presentation is complicated by their history of multiple comorbid medical problems  including Eliquis utilization for anticoagulation.  Patient placed on continuous vitals and telemetry monitoring while in ED which was reviewed periodically.   On my initial exam, the pt was with an intact neurologic exam, tolerating ambulation with an antalgic gait and p.o. intake without difficulty.  Patient had no abnormal DTRs, no midline spinal tenderness.  Patient endorsing complete sensation of the perineum.  Patient without episodes of fecal or urinary incontinence.  Patient has no focal neurologic deficits and reassuring vital signs at this time.  No obvious physical abnormality or injury on exam. Notably, patient denies recent trauma, is afebrile, and denies IVDU.    Reviewed and confirmed  nursing documentation for past medical history, family history, social history.    Initial Assessment:   With the patient's presentation of acute back pain in the above setting, most likely diagnosis is musculoskeletal strain. Other diagnoses were considered including (but not limited to) underlying fracture, epidural hematoma, cauda equina syndrome, spinal stenosis, spinal malignancy. These are considered less likely due to history of present illness and physical exam findings.   In particular, lack of fever, substantial history of IV drug use, or substantial neurologic abnormality is less consistent with epidural abscess versus discitis or other spinal infection.  Initial Plan:  Multimodal pain control described and patient informed on safe usage.  Notably, patient is not a good candidate for NSAIDs secondary to apixaban utilization. Screening evaluation including below extensive physical exam and neurologic exam unremarkable this time Patient stable for continued outpatient evaluation and management of their musculoskeletal pains.  Patient referred back to primary care provider for continued evaluation and management.   Disposition:   Based on the above findings, I believe patient is stable for discharge.    Patient and family educated about specific return precautions for given chief complaint and symptoms.  Patient and family educated about follow-up with PCP .  Patient and family expressed understanding of return precautions and need for follow-up. Patient spoken to regarding all imaging and laboratory results and appropriate follow up for these results. All education provided in verbal and written form and time was allowed for answering of patient questions. Patient discharged.          Emergency Department Medication Summary:   Medications  lidocaine (LIDODERM) 5 % 1 patch (1 patch Transdermal Patch Applied 12/10/21 1540)  oxyCODONE (Oxy IR/ROXICODONE) immediate release tablet 5 mg (5  mg Oral Given 12/10/21 1540)     Clinical Impression:  1. Acute right-sided low back pain with right-sided sciatica      Discharge   Final Clinical Impression(s) / ED Diagnoses Final diagnoses:  Acute right-sided low back pain with right-sided sciatica    Rx / DC Orders ED Discharge Orders     None         Tretha Sciara, MD 12/10/21 [phone]

## 2021-12-11 ENCOUNTER — Other Ambulatory Visit: Payer: Self-pay

## 2021-12-11 ENCOUNTER — Inpatient Hospital Stay: Payer: Medicare Other

## 2021-12-11 ENCOUNTER — Inpatient Hospital Stay (HOSPITAL_BASED_OUTPATIENT_CLINIC_OR_DEPARTMENT_OTHER): Payer: Medicare Other | Admitting: Hematology and Oncology

## 2021-12-11 ENCOUNTER — Encounter: Payer: Self-pay | Admitting: Hematology and Oncology

## 2021-12-11 VITALS — BP 162/58 | HR 89 | Temp 97.8°F | Resp 18 | Ht 67.0 in | Wt 197.0 lb

## 2021-12-11 DIAGNOSIS — C50312 Malignant neoplasm of lower-inner quadrant of left female breast: Secondary | ICD-10-CM

## 2021-12-11 DIAGNOSIS — Z170 Estrogen receptor positive status [ER+]: Secondary | ICD-10-CM

## 2021-12-11 DIAGNOSIS — C7989 Secondary malignant neoplasm of other specified sites: Secondary | ICD-10-CM

## 2021-12-11 DIAGNOSIS — C50919 Malignant neoplasm of unspecified site of unspecified female breast: Secondary | ICD-10-CM

## 2021-12-11 DIAGNOSIS — Z5112 Encounter for antineoplastic immunotherapy: Secondary | ICD-10-CM | POA: Diagnosis not present

## 2021-12-11 LAB — CBC WITH DIFFERENTIAL (CANCER CENTER ONLY)
Abs Immature Granulocytes: 0.01 10*3/uL (ref 0.00–0.07)
Basophils Absolute: 0 10*3/uL (ref 0.0–0.1)
Basophils Relative: 1 %
Eosinophils Absolute: 0.1 10*3/uL (ref 0.0–0.5)
Eosinophils Relative: 2 %
HCT: 36.8 % (ref 36.0–46.0)
Hemoglobin: 11.9 g/dL — ABNORMAL LOW (ref 12.0–15.0)
Immature Granulocytes: 0 %
Lymphocytes Relative: 25 %
Lymphs Abs: 1.4 10*3/uL (ref 0.7–4.0)
MCH: 25.4 pg — ABNORMAL LOW (ref 26.0–34.0)
MCHC: 32.3 g/dL (ref 30.0–36.0)
MCV: 78.6 fL — ABNORMAL LOW (ref 80.0–100.0)
Monocytes Absolute: 0.4 10*3/uL (ref 0.1–1.0)
Monocytes Relative: 7 %
Neutro Abs: 3.8 10*3/uL (ref 1.7–7.7)
Neutrophils Relative %: 65 %
Platelet Count: 223 10*3/uL (ref 150–400)
RBC: 4.68 MIL/uL (ref 3.87–5.11)
RDW: 16 % — ABNORMAL HIGH (ref 11.5–15.5)
WBC Count: 5.7 10*3/uL (ref 4.0–10.5)
nRBC: 0 % (ref 0.0–0.2)

## 2021-12-11 LAB — CMP (CANCER CENTER ONLY)
ALT: 7 U/L (ref 0–44)
AST: 10 U/L — ABNORMAL LOW (ref 15–41)
Albumin: 4.1 g/dL (ref 3.5–5.0)
Alkaline Phosphatase: 52 U/L (ref 38–126)
Anion gap: 7 (ref 5–15)
BUN: 21 mg/dL (ref 8–23)
CO2: 28 mmol/L (ref 22–32)
Calcium: 9.3 mg/dL (ref 8.9–10.3)
Chloride: 104 mmol/L (ref 98–111)
Creatinine: 0.78 mg/dL (ref 0.44–1.00)
GFR, Estimated: >60 mL/min
Glucose, Bld: 158 mg/dL — ABNORMAL HIGH (ref 70–99)
Potassium: 4 mmol/L (ref 3.5–5.1)
Sodium: 139 mmol/L (ref 135–145)
Total Bilirubin: 0.5 mg/dL (ref 0.3–1.2)
Total Protein: 6.7 g/dL (ref 6.5–8.1)

## 2021-12-11 MED ORDER — HEPARIN SOD (PORK) LOCK FLUSH 100 UNIT/ML IV SOLN
500.0000 [IU] | Freq: Once | INTRAVENOUS | Status: AC | PRN
  Administered 2021-12-11: 500 [IU]

## 2021-12-11 MED ORDER — TRASTUZUMAB-ANNS CHEMO 150 MG IV SOLR
6.0000 mg/kg | Freq: Once | INTRAVENOUS | Status: AC
  Administered 2021-12-11: 588 mg via INTRAVENOUS
  Filled 2021-12-11: qty 28

## 2021-12-11 MED ORDER — FULVESTRANT 250 MG/5ML IM SOSY
500.0000 mg | PREFILLED_SYRINGE | INTRAMUSCULAR | Status: DC
  Administered 2021-12-11: 500 mg via INTRAMUSCULAR
  Filled 2021-12-11: qty 10

## 2021-12-11 MED ORDER — ACETAMINOPHEN 325 MG PO TABS
650.0000 mg | ORAL_TABLET | Freq: Once | ORAL | Status: AC
  Administered 2021-12-11: 650 mg via ORAL
  Filled 2021-12-11: qty 2

## 2021-12-11 MED ORDER — SODIUM CHLORIDE 0.9% FLUSH
10.0000 mL | INTRAVENOUS | Status: DC | PRN
  Administered 2021-12-11: 10 mL

## 2021-12-11 MED ORDER — SODIUM CHLORIDE 0.9% FLUSH
10.0000 mL | Freq: Once | INTRAVENOUS | Status: AC
  Administered 2021-12-11: 10 mL

## 2021-12-11 MED ORDER — DIPHENHYDRAMINE HCL 25 MG PO CAPS
50.0000 mg | ORAL_CAPSULE | Freq: Once | ORAL | Status: AC
  Administered 2021-12-11: 50 mg via ORAL
  Filled 2021-12-11: qty 2

## 2021-12-11 MED ORDER — SODIUM CHLORIDE 0.9 % IV SOLN: Freq: Once | INTRAVENOUS | Status: AC

## 2021-12-11 NOTE — Patient Instructions (Signed)
[facility]ICAL [facility]  Discharge Instructions: Thank you for choosing [facility] to provide your oncology and hematology care.   If you have a lab appointment with the Cancer Center, please go directly to the Cancer Center and check in at the registration area.   Wear comfortable clothing and clothing appropriate for easy access to any Portacath or PICC line.   We strive to give you quality time with your provider. You may need to reschedule your appointment if you arrive late (15 or more minutes).  Arriving late affects you and other patients whose appointments are after yours.  Also, if you miss three or more appointments without notifying the office, you may be dismissed from the clinic at the provider's discretion.      For prescription refill requests, have your pharmacy contact our office and allow 72 hours for refills to be completed.    Today you received the following chemotherapy and/or immunotherapy agents: trastuzumab      To help prevent nausea and vomiting after your treatment, we encourage you to take your nausea medication as directed.  BELOW ARE SYMPTOMS THAT SHOULD BE REPORTED IMMEDIATELY: *FEVER GREATER THAN 100.4 F (38 C) OR HIGHER *CHILLS OR SWEATING *NAUSEA AND VOMITING THAT IS NOT CONTROLLED WITH YOUR NAUSEA MEDICATION *UNUSUAL SHORTNESS OF BREATH *UNUSUAL BRUISING OR BLEEDING *URINARY PROBLEMS (pain or burning when urinating, or frequent urination) *BOWEL PROBLEMS (unusual diarrhea, constipation, pain near the anus) TENDERNESS IN MOUTH AND THROAT WITH OR WITHOUT PRESENCE OF ULCERS (sore throat, sores in mouth, or a toothache) UNUSUAL RASH, SWELLING OR PAIN  UNUSUAL VAGINAL DISCHARGE OR ITCHING   Items with * indicate a potential emergency and should be followed up as soon as possible or go to the Emergency Department if any problems should occur.  Please show the CHEMOTHERAPY ALERT CARD or IMMUNOTHERAPY ALERT CARD at check-in  to the Emergency Department and triage nurse.  Should you have questions after your visit or need to cancel or reschedule your appointment, please contact [facility]  Dept: [phone]  and follow the prompts.  Office hours are 8:00 a.m. to 4:30 p.m. Monday - Friday. Please note that voicemails left after 4:00 p.m. may not be returned until the following business day.  We are closed weekends and major holidays. You have access to a nurse at all times for urgent questions. Please call the main number to the clinic Dept: [phone] and follow the prompts.   For any non-urgent questions, you may also contact your provider using MyChart. We now offer e-Visits for anyone 18 and older to request care online for non-urgent symptoms. For details visit [email or web address].   Also download the MyChart app! Go to the app store, search "MyChart", open the app, select [facility], and log in with your MyChart username and password.  Masks are optional in the cancer centers. If you would like for your care team to wear a mask while they are taking care of you, please let them know. You may have one support person who is at least 86 years old accompany you for your appointments. 

## 2021-12-11 NOTE — Progress Notes (Signed)
[name]:    [name], MD (Inactive) No address on file   DIAGNOSIS:  Cancer Staging  Chest wall recurrence of breast cancer [facility]) Staging form: Breast, AJCC 7th Edition - Clinical: Stage Unknown ([place], N1, M0) - Signed by Chauncey Cruel, MD on 07/27/2014  Malignant neoplasm of lower-inner quadrant of left breast in female, estrogen receptor positive ([place]) Staging form: Breast, AJCC 7th Edition - Clinical: Stage IA (T1b, N0, M0) - Signed by Chauncey Cruel, MD on 07/27/2014   SUMMARY OF ONCOLOGIC HISTORY:  88 y.o. [place] woman   (1) status post right lumpectomy and axillary dissection in 1993 in [place] for what appears to have been a stage I breast cancer, treated with radiation and then tamoxifen for 5 years   (2) status post right mastectomy 2002 for a recurrence in the right breast, treated adjuvantly with tamoxifen for an additional 5 years   (3) status post left mastectomy and sentinel lymph node sampling 03/07/2003 for a lower inner quadrant pT1b, pN0, stage IA mucinous breast cancer, grade 2; no radiation therapy was given. Tamoxifen was continued until 2009   RECURRENT DISEASE/ LEFT: OCTOBER 2014 (4) excisional biopsy of a left chest wall mass 01/26/2013 showing carcinoma consistent with invasive ductal carcinoma, estrogen receptor 100% positive, progesterone receptor 0% positive, with an MIB-1 of 22% and no HER-2 amplification.    (5)  PET scan on 03/03/2013 confirmed extensive metastatic adenopathy in the left axilla, left subpectoral musculature, and left supraclavicular region   (6)  started on anastrozole daily beginning 03/01/2013, interrupted during radiation therapy, resumed March 2015, discontinued March 2016 because of fatigue and arthralgias   (7) radiation therapy 03/31/2013 through 05/23/2013 Site/dose:   The patient was treated initially with a forward treatment planning technique to the left chest wall  in addition to treatment to the supraclavicular region. This consisted of a 3-D conformal technique. The patient was treated in this fashion to a dose of 50.4 gray. The patient then received a 14 gray boost treatment using an electron field. The total dose was 64.4 gray.   (8) Osteopenia, zometa yearly started 07/27/2014, but poorly tolerated.              (a) bone density scan on 12/27/13 showed a t-score of -1.8              (b) started Denosumab/Prolia April 2017, canceled after one dose per patient   (9) tamoxifen started 07/27/2014, stopped November 2019 (had total 5 years of antiestrogens)   (10) likely thalassemia trait   (11) left subpectoral soft tissue nodule noted 07/17/2015 unchanged through serial scans, most recent 20/80/2233, with no metabolic activity noted in PET scan of 2016 and interval calcification.  This is presumed benign   (12)  ADDITIONAL REGIONAL RECURRENCE:             (A) CT of the chest with contrast 03/05/2021 obtained to evaluate left upper extremity lymphedema as showed acute pulmonary emboli.  There was also left axillary and subpectoral lymphadenopathy             (B) rivaroxaban started 03/05/2021             (C) left breast ultrasound 03/29/2021 shows 2 areas in the infraclavicular tissue consistent with fat necrosis.  There were however multiple retropectoral interpectoral and left axillary masses.             (D) left axillary lymph node biopsy 04/04/2021: IDC  grade 3, ER positive, 100%, strong staining intensity, PR positive, 20%, weak staining intensity, Ki-67 of 15%, HER2 positive by IHC, ratio 2.90             (E) Fulvestrant every 4 weeks beginning 04/18/2021             (F) Herceptin every 3 weeks beginning 05/30/2021  Later changed both to every 4 weeks for better appointment schedule and patient convenience.    CURRENT THERAPY: Trastuzumab & Fulvestrant  INTERVAL HISTORY:  ESPARANZA KRIDER 88 y.o. female returns for follow-up of her estrogen  positive progesterone positive and HER2 positive breast cancer recurrence diagnosed in November 2022.    She is receiving herceptin and fulvestrant every 4 weeks. She tells me that she went to the ED yesterday for severe back pain.  She was told this is likely musculoskeletal pain and discharge.  She this morning took some Tylenol and meloxicam and denies any pain currently.  She just noticed this pain about 2 days ago.  Prior to that she had no pain.  She is tolerating the current combination well.  She once again asks the same questions, appears to have forgetfulness.  She wonders how long she needs to take this combination and what were the imaging results back from June.  No change in breathing or urinary habits Rest of the pertinent 10 point ROS reviewed and negative.   Patient Active Problem List   Diagnosis Date Noted   Genetic testing 07/24/2021   Microcytosis 04/29/2021   Hypokalemia 04/28/2021   SBO (small bowel obstruction) ([place]) 04/27/2021   Pulmonary embolism ([place])    Agnosia 08/09/2020   Hardening of the aorta (main artery of the heart) ([place]) 08/09/2020   Morbid obesity ([place]) 08/09/2020   Neuropathy 08/09/2020   Primary osteoarthritis 08/09/2020   Sensorineural hearing loss (SNHL) of both ears 08/07/2020   Anosmia 06/05/2020   Tinnitus, bilateral 06/05/2020   GI bleed 07/13/2019   Acute blood loss anemia 07/12/2019   Microcytic anemia 01/05/2018   Melena 01/04/2018   Lower GI bleed 01/04/2018   Type 2 diabetes mellitus without complication ([place]) 29/47/6546   Bacteremia due to methicillin susceptible Staphylococcus aureus (MSSA) 12/02/2017   Tenosynovitis of left wrist 12/02/2017   Infection of left wrist ([place]) 11/30/2017   Primary osteoarthritis of right hip 01/05/2017   Osteoarthritis of right hip 01/03/2017   Lymphedema of upper extremity 01/17/2014   Osteopenia 01/17/2014   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    Dilated pore of [place] 08/02/2013   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   Diabetes mellitus    fasting 90-100   HTN (hypertension) 03/25/2013   Hx of radiation therapy    breasts hx   Hypercholesterolemia    Hypertension    Lymphedema of arm    left  arm   Pulmonary embolism ([place])    Type II or unspecified type diabetes mellitus without mention of complication, not stated as uncontrolled 03/25/2013    SURGICAL HISTORY: Past Surgical History:  Procedure Laterality Date   ABDOMINAL HYSTERECTOMY     ANKLE SURGERY Right 1995   APPENDECTOMY     BIOPSY  01/07/2018   Procedure: BIOPSY;  Surgeon: [name], MD;  Location: [facility];  Service: Gastroenterology;;   BREAST SURGERY Bilateral    mastectomy   COLONOSCOPY WITH PROPOFOL N/A 01/07/2018   Procedure: COLONOSCOPY WITH PROPOFOL;  Surgeon: [name], MD;  Location: [facility];  Service: Gastroenterology;  Laterality: N/A;   ESOPHAGOGASTRODUODENOSCOPY (EGD) WITH PROPOFOL N/A 01/06/2018   Procedure: ESOPHAGOGASTRODUODENOSCOPY (EGD) WITH PROPOFOL;  Surgeon: [name], MD;  Location: [facility];  Service: Gastroenterology;  Laterality: N/A;   HERNIA REPAIR  04-26-2010   IR IMAGING GUIDED PORT INSERTION  10/29/2021   MASS EXCISION Left 01/26/2013   Procedure: EXCISION LEFT  CHEST WALL MASS AND LEFT ABDOMNAL WALL MASS;  Surgeon: Harl Bowie, MD;  Location: [place];  Service: General;  Laterality: Left;   MASS EXCISION Left 09/20/2014   Procedure: EXCISION OF LEFT CHEST WALL MASS;  Surgeon: Coralie Keens, MD;  Location: [place];  Service: General;  Laterality: Left;   TEE WITHOUT CARDIOVERSION N/A 12/08/2017   Procedure: TRANSESOPHAGEAL ECHOCARDIOGRAM (TEE);  Surgeon: Lelon Perla, MD;  Location: [place];  Service: Cardiovascular;  Laterality: N/A;   TOTAL HIP ARTHROPLASTY Right 01/05/2017   TOTAL HIP ARTHROPLASTY Right 01/05/2017   Procedure: TOTAL HIP ARTHROPLASTY ANTERIOR APPROACH;  Surgeon: Frederik Pear, MD;  Location: [place];  Service: Orthopedics;  Laterality: Right;    SOCIAL HISTORY: Social History   Socioeconomic History   Marital status: Married    Spouse name: Not on file   Number of children: Not on file   Years of education: Not on file   Highest education level: Not on file  Occupational History   Occupation: retired Education officer, [employer]  Tobacco Use   Smoking status: Former    Types: Cigarettes    Quit date: 04/14/1972    Years since quitting: 49.6   Smokeless tobacco: Never  Vaping Use   Vaping Use: Never used  Substance and Sexual Activity   Alcohol use: Yes    Comment: occasional   Drug use: Not Currently   Sexual activity: Yes    Birth control/protection: Surgical  Other Topics Concern   Not on file  Social History Narrative   Not on file   Social Determinants of Health   Financial Resource Strain: Not on file  Food Insecurity: Not on file  Transportation Needs: Not on file  Physical Activity: Not on file  Stress: Not on file  Social Connections: Not on file  Intimate Partner Violence: Not on file    FAMILY HISTORY: Family History  Problem Relation Age of Onset   Breast cancer Cousin        maternal first cousin   Uterine cancer Cousin        maternal first cousin    Review of Systems   Constitutional:  Negative for appetite change, chills, fatigue, fever and unexpected weight change.  HENT:   Negative for hearing loss, lump/mass and trouble swallowing.   Eyes:  Negative for eye problems and icterus.  Respiratory:  Negative for chest tightness, cough and shortness of breath.   Cardiovascular:  Negative for chest pain, leg swelling and palpitations.  Gastrointestinal:  Negative for abdominal distention, abdominal pain, constipation, diarrhea, nausea and vomiting.  Endocrine: Negative for hot flashes.  Genitourinary:  Negative for difficulty urinating.   Musculoskeletal:  Negative for arthralgias.  Skin:  Negative for itching and rash.  Neurological:  Negative for dizziness, extremity weakness, headaches and numbness.  Hematological:  Negative for adenopathy. Does not bruise/bleed easily.  Psychiatric/Behavioral:  Negative for depression. The patient is not nervous/anxious.      PHYSICAL EXAMINATION  ECOG PERFORMANCE STATUS: 1 - Symptomatic but completely ambulatory  Vitals:   12/11/21 1251  BP: (!) 162/58  Pulse: 89  Resp: 18  Temp: 97.8 F (36.6 C)  SpO2: 100%     Physical Exam Constitutional:      General: She is not in acute distress.    Appearance: Normal appearance. She is not toxic-appearing.  HENT:     Head: Normocephalic and atraumatic.  Eyes:     General: No scleral icterus. Cardiovascular:     Rate and Rhythm: Normal rate and regular rhythm.     Pulses: Normal pulses.     Heart sounds: Normal heart sounds.  Pulmonary:     Effort: Pulmonary effort is normal.     Breath sounds: Normal breath sounds.  Chest:       Comments: Abnormal density in marked area. No significant change.  Abdominal:     General: Abdomen is flat. Bowel sounds are normal. There is no distension.     Palpations: Abdomen is soft.     Tenderness: There is no abdominal tenderness.  Musculoskeletal:        General: Swelling (Left upper extremity swelling) present. No  tenderness (No palpable or reproducible tenderness in the area of pain.).     Cervical back: Neck supple.  Lymphadenopathy:     Cervical: No cervical adenopathy.  Skin:    General: Skin is warm and dry.     Findings: No rash.     Comments: Skin nodules do not demonstrate any change since last visit  Neurological:     General: No focal deficit present.     Mental Status: She is alert.  Psychiatric:        Mood and Affect: Mood normal.        Behavior: Behavior normal.     LABORATORY DATA:  CBC    Component Value Date/Time   WBC 5.7 12/11/2021 1227   WBC 6.3 12/10/2021 1322   RBC 4.68 12/11/2021 1227   HGB 11.9 (L) 12/11/2021 1227   HGB 10.5 (L) 01/20/2017 1057   HCT 36.8 12/11/2021 1227   HCT 25.8 (L) 01/05/2018 0513   HCT 32.8 (L) 01/20/2017 1057   PLT 223 12/11/2021 1227   PLT 368 01/20/2017 1057   MCV 78.6 (L) 12/11/2021 1227   MCV 77.8 (L) 01/20/2017 1057   MCH 25.4 (L) 12/11/2021 1227   MCHC 32.3 12/11/2021 1227   RDW 16.0 (H) 12/11/2021 1227   RDW 16.0 (H) 01/20/2017 1057   LYMPHSABS 1.4 12/11/2021 1227   LYMPHSABS 1.3 01/20/2017 1057   MONOABS 0.4 12/11/2021 1227   MONOABS 0.4 01/20/2017 1057   EOSABS 0.1 12/11/2021 1227   EOSABS 0.1 01/20/2017 1057   BASOSABS 0.0 12/11/2021 1227   BASOSABS 0.1 01/20/2017 1057    CMP     Component Value Date/Time   NA 137 12/10/2021 1322   NA 139 01/20/2017 1057   K 5.6 (H) 12/10/2021 1322   K 3.9 01/20/2017 1057   CL 101 12/10/2021 1322   CO2  23 12/10/2021 1322   CO2 25 01/20/2017 1057   GLUCOSE 107 (H) 12/10/2021 1322   GLUCOSE 135 01/20/2017 1057   BUN 17 12/10/2021 1322   BUN 15.6 01/20/2017 1057   CREATININE 0.67 12/10/2021 1322   CREATININE 0.73 11/14/2021 1221   CREATININE 0.7 01/20/2017 1057   CALCIUM 9.7 12/10/2021 1322   CALCIUM 9.3 01/20/2017 1057   PROT 7.3 11/14/2021 1221   PROT 7.1 01/20/2017 1057   ALBUMIN 4.3 11/14/2021 1221   ALBUMIN 3.4 (L) 01/20/2017 1057   AST 15 11/14/2021 1221   AST  10 01/20/2017 1057   ALT 9 11/14/2021 1221   ALT 14 01/20/2017 1057   ALKPHOS 54 11/14/2021 1221   ALKPHOS 69 01/20/2017 1057   BILITOT 0.5 11/14/2021 1221   BILITOT 0.43 01/20/2017 1057   GFRNONAA >60 12/10/2021 1322   GFRNONAA >60 11/14/2021 1221   GFRAA >60 07/14/2019 0538     ASSESSMENT and THERAPY PLAN:   1) status post right lumpectomy and axillary dissection in 1993 in [place] for what appears to have been a stage I breast cancer, treated with radiation and then tamoxifen for 5 years   (2) status post right mastectomy 2002 for a recurrence in the right breast, treated adjuvantly with tamoxifen for an additional 5 years   (3) status post left mastectomy and sentinel lymph node sampling 03/07/2003 for a lower inner quadrant pT1b, pN0, stage IA mucinous breast cancer, grade 2; no radiation therapy was given. Tamoxifen was continued until 2009   RECURRENT DISEASE/ LEFT: OCTOBER 2014 (4) excisional biopsy of a left chest wall mass 01/26/2013 showing carcinoma consistent with invasive ductal carcinoma, estrogen receptor 100% positive, progesterone receptor 0% positive, with an MIB-1 of 22% and no HER-2 amplification.    (5)  PET scan on 03/03/2013 confirmed extensive metastatic adenopathy in the left axilla, left subpectoral musculature, and left supraclavicular region   (6)  started on anastrozole daily beginning 03/01/2013, interrupted during radiation therapy, resumed March 2015, discontinued March 2016 because of fatigue and arthralgias   (7) radiation therapy 03/31/2013 through 05/23/2013 Site/dose:   The patient was treated initially with a forward treatment planning technique to the left chest wall in addition to treatment to the supraclavicular region. This consisted of a 3-D conformal technique. The patient was treated in this fashion to a dose of 50.4 gray. The patient then received a 14 gray boost treatment using an electron field. The total dose was 64.4 gray.   (8)  Osteopenia, zometa yearly started 07/27/2014, but poorly tolerated.              (a) bone density scan on 12/27/13 showed a t-score of -1.8              (b) started Denosumab/Prolia April 2017, canceled after one dose per patient   (9) tamoxifen started 07/27/2014, stopped November 2019 (had total 5 years of antiestrogens)   (10) likely thalassemia trait   (11) left subpectoral soft tissue nodule noted 07/17/2015 unchanged through serial scans, most recent 66/09/3014, with no metabolic activity noted in PET scan of 2016 and interval calcification.  This is presumed benign   (12)  ADDITIONAL REGIONAL RECURRENCE:             (A) CT of the chest with contrast 03/05/2021 obtained to evaluate left upper extremity lymphedema as showed acute pulmonary emboli.  There was also left axillary and subpectoral lymphadenopathy             (B)  rivaroxaban started 03/05/2021             (C) left breast ultrasound 03/29/2021 shows 2 areas in the infraclavicular tissue consistent with fat necrosis.  There were however multiple retropectoral interpectoral and left axillary masses.             (D) left axillary lymph node biopsy 04/04/2021   She had biopsy of the left axillary lymph node mass which showed invasive ductal carcinoma, grade 3, ER positive, 100%, strong staining intensity, PR positive, 20%, weak staining intensity, Ki-67 of 15%, HER2 positive by IHC, ratio 2.90 She was initially scheduled for fulvestrant however given her to positive breast cancer, we have discussed about additional therapy today.  CT chest abdomen pelvis showed left axillary, left subpectoral, left supraclavicular and mediastinal adenopathy which is stable compared to November.  Scattered tiny pulmonary nodules, stable, hepatic steatosis.  Bone scan without any evidence of metastatic disease.  She was started on Herceptin along with Faslodex for treatment of local recurrence. Skin biopsy over left chest wall, benign   PLAN:   She  is on herceptin and faslodex every 28 days, more for convenience since Herceptin is every 21 days and Faslodex every 28 days, she had to deal with multiple appointments Last imaging showed stable disease.  She once again monitor reviewed the imaging findings from June today.  We have discussed this on multiple occasions.  We have discussed the nature of disease which is metastatic in the indefinite duration of treatment but she appears to have forgetfulness and asked these questions pretty much on every single visit. We have once again discussed that there are more aggressive treatments but given her age, comorbidities and underlying performance status, she is unfortunately not a good candidate for these treatments.  She understands that aggressive chemotherapy can be life-threatening at her age. At this time we recommended continuing current treatment, repeat imaging in September as scheduled and follow-up in 28 days or sooner as needed. Last echocardiogram from July satisfactory  Total encounter time: 30 minutes in face-to-face visit time, chart review, lab review, care coordination, order entry, and documentation of the encounter.  *Total Encounter Time as defined by the Centers for Medicare and Medicaid Services includes, in addition to the face-to-face time of a patient visit (documented in the note above) non-face-to-face time: obtaining and reviewing outside history, ordering and reviewing medications, tests or procedures, care coordination (communications with other health care professionals or caregivers) and documentation in the medical record.

## 2021-12-12 ENCOUNTER — Inpatient Hospital Stay: Payer: Medicare Other

## 2021-12-12 LAB — CANCER ANTIGEN 27.29: CA 27.29: 38.2 U/mL (ref 0.0–38.6)

## 2021-12-19 ENCOUNTER — Ambulatory Visit (HOSPITAL_COMMUNITY)
Admission: RE | Admit: 2021-12-19 | Discharge: 2021-12-19 | Disposition: A | Discharge status: Home / Self Care | Payer: Medicare Other | Source: Ambulatory Visit | Attending: Hematology and Oncology | Admitting: Hematology and Oncology

## 2021-12-19 DIAGNOSIS — Z170 Estrogen receptor positive status [ER+]: Secondary | ICD-10-CM | POA: Insufficient documentation

## 2021-12-19 DIAGNOSIS — C7989 Secondary malignant neoplasm of other specified sites: Secondary | ICD-10-CM | POA: Diagnosis present

## 2021-12-19 DIAGNOSIS — C50312 Malignant neoplasm of lower-inner quadrant of left female breast: Secondary | ICD-10-CM | POA: Diagnosis present

## 2021-12-19 DIAGNOSIS — C50919 Malignant neoplasm of unspecified site of unspecified female breast: Secondary | ICD-10-CM | POA: Diagnosis present

## 2021-12-19 MED ORDER — HEPARIN SOD (PORK) LOCK FLUSH 100 UNIT/ML IV SOLN
INTRAVENOUS | Status: AC
  Administered 2021-12-19: 500 [IU]
  Filled 2021-12-19: qty 5

## 2021-12-19 MED ORDER — SODIUM CHLORIDE (PF) 0.9 % IJ SOLN
INTRAMUSCULAR | Status: AC
  Filled 2021-12-19: qty 50

## 2021-12-19 MED ORDER — IOHEXOL 300 MG/ML  SOLN
100.0000 mL | Freq: Once | INTRAMUSCULAR | Status: AC | PRN
  Administered 2021-12-19: 100 mL via INTRAVENOUS

## 2022-01-09 ENCOUNTER — Inpatient Hospital Stay: Payer: Medicare Other

## 2022-01-09 ENCOUNTER — Other Ambulatory Visit: Payer: Self-pay

## 2022-01-09 ENCOUNTER — Inpatient Hospital Stay: Payer: Medicare Other | Attending: Adult Health | Admitting: Hematology and Oncology

## 2022-01-09 VITALS — BP 160/55 | HR 73 | Temp 97.0°F | Resp 18 | Ht 67.0 in | Wt 199.8 lb

## 2022-01-09 DIAGNOSIS — C778 Secondary and unspecified malignant neoplasm of lymph nodes of multiple regions: Secondary | ICD-10-CM | POA: Insufficient documentation

## 2022-01-09 DIAGNOSIS — Z170 Estrogen receptor positive status [ER+]: Secondary | ICD-10-CM

## 2022-01-09 DIAGNOSIS — C50312 Malignant neoplasm of lower-inner quadrant of left female breast: Secondary | ICD-10-CM

## 2022-01-09 DIAGNOSIS — M8580 Other specified disorders of bone density and structure, unspecified site: Secondary | ICD-10-CM | POA: Diagnosis not present

## 2022-01-09 DIAGNOSIS — Z5112 Encounter for antineoplastic immunotherapy: Secondary | ICD-10-CM | POA: Diagnosis present

## 2022-01-09 DIAGNOSIS — C50919 Malignant neoplasm of unspecified site of unspecified female breast: Secondary | ICD-10-CM

## 2022-01-09 DIAGNOSIS — Z5111 Encounter for antineoplastic chemotherapy: Secondary | ICD-10-CM | POA: Insufficient documentation

## 2022-01-09 LAB — CMP (CANCER CENTER ONLY)
ALT: 8 U/L (ref 0–44)
AST: 11 U/L — ABNORMAL LOW (ref 15–41)
Albumin: 4.1 g/dL (ref 3.5–5.0)
Alkaline Phosphatase: 56 U/L (ref 38–126)
Anion gap: 9 (ref 5–15)
BUN: 18 mg/dL (ref 8–23)
CO2: 29 mmol/L (ref 22–32)
Calcium: 9.2 mg/dL (ref 8.9–10.3)
Chloride: 102 mmol/L (ref 98–111)
Creatinine: 0.63 mg/dL (ref 0.44–1.00)
GFR, Estimated: >60 mL/min (ref >60)
Glucose, Bld: 106 mg/dL — ABNORMAL HIGH (ref 70–99)
Potassium: 3.8 mmol/L (ref 3.5–5.1)
Sodium: 140 mmol/L (ref 135–145)
Total Bilirubin: 0.5 mg/dL (ref 0.3–1.2)
Total Protein: 7 g/dL (ref 6.5–8.1)

## 2022-01-09 LAB — CBC WITH DIFFERENTIAL (CANCER CENTER ONLY)
Abs Immature Granulocytes: 0.02 10*3/uL (ref 0.00–0.07)
Basophils Absolute: 0 10*3/uL (ref 0.0–0.1)
Basophils Relative: 0 %
Eosinophils Absolute: 0.2 10*3/uL (ref 0.0–0.5)
Eosinophils Relative: 3 %
HCT: 36.8 % (ref 36.0–46.0)
Hemoglobin: 12 g/dL (ref 12.0–15.0)
Immature Granulocytes: 0 %
Lymphocytes Relative: 31 %
Lymphs Abs: 1.9 10*3/uL (ref 0.7–4.0)
MCH: 25.8 pg — ABNORMAL LOW (ref 26.0–34.0)
MCHC: 32.6 g/dL (ref 30.0–36.0)
MCV: 79.1 fL — ABNORMAL LOW (ref 80.0–100.0)
Monocytes Absolute: 0.5 10*3/uL (ref 0.1–1.0)
Monocytes Relative: 7 %
Neutro Abs: 3.6 10*3/uL (ref 1.7–7.7)
Neutrophils Relative %: 59 %
Platelet Count: 218 10*3/uL (ref 150–400)
RBC: 4.65 MIL/uL (ref 3.87–5.11)
RDW: 15.7 % — ABNORMAL HIGH (ref 11.5–15.5)
WBC Count: 6.2 10*3/uL (ref 4.0–10.5)
nRBC: 0 % (ref 0.0–0.2)

## 2022-01-09 MED ORDER — SODIUM CHLORIDE 0.9% FLUSH
10.0000 mL | INTRAVENOUS | Status: DC | PRN
  Administered 2022-01-09: 10 mL via INTRAVENOUS

## 2022-01-09 MED ORDER — SODIUM CHLORIDE 0.9 % IV SOLN: Freq: Once | INTRAVENOUS | Status: DC

## 2022-01-09 MED ORDER — FULVESTRANT 250 MG/5ML IM SOSY
500.0000 mg | PREFILLED_SYRINGE | INTRAMUSCULAR | Status: DC
  Administered 2022-01-09: 500 mg via INTRAMUSCULAR
  Filled 2022-01-09: qty 10

## 2022-01-09 MED ORDER — TRASTUZUMAB-ANNS CHEMO 150 MG IV SOLR
6.0000 mg/kg | Freq: Once | INTRAVENOUS | Status: AC
  Administered 2022-01-09: 588 mg via INTRAVENOUS
  Filled 2022-01-09: qty 28

## 2022-01-09 MED ORDER — ACETAMINOPHEN 325 MG PO TABS
650.0000 mg | ORAL_TABLET | Freq: Once | ORAL | Status: AC
  Administered 2022-01-09: 650 mg via ORAL
  Filled 2022-01-09: qty 2

## 2022-01-09 MED ORDER — DIPHENHYDRAMINE HCL 25 MG PO CAPS
50.0000 mg | ORAL_CAPSULE | Freq: Once | ORAL | Status: AC
  Administered 2022-01-09: 50 mg via ORAL
  Filled 2022-01-09: qty 2

## 2022-01-09 NOTE — Progress Notes (Signed)
Tami Schmidt Follow up:    Tami Han, MD (Inactive) No address on file   DIAGNOSIS:  Schmidt Staging  Chest wall recurrence of breast Schmidt [facility]) Staging form: Breast, AJCC 7th Edition - Clinical: Stage Unknown ([place], N1, M0) - Signed by Chauncey Cruel, MD on 07/27/2014  Malignant neoplasm of lower-inner quadrant of left breast in female, estrogen receptor positive ([place]) Staging form: Breast, AJCC 7th Edition - Clinical: Stage IA (T1b, N0, M0) - Signed by Chauncey Cruel, MD on 07/27/2014   SUMMARY OF ONCOLOGIC HISTORY:  88 y.o. [place] woman   (1) status post right lumpectomy and axillary dissection in 1993 in [place] for what appears to have been a stage I breast Schmidt, treated with radiation and then tamoxifen for 5 years   (2) status post right mastectomy 2002 for a recurrence in the right breast, treated adjuvantly with tamoxifen for an additional 5 years   (3) status post left mastectomy and sentinel lymph node sampling 03/07/2003 for a lower inner quadrant pT1b, pN0, stage IA mucinous breast Schmidt, grade 2; no radiation therapy was given. Tamoxifen was continued until 2009   RECURRENT DISEASE/ LEFT: OCTOBER 2014 (4) excisional biopsy of a left chest wall mass 01/26/2013 showing carcinoma consistent with invasive ductal carcinoma, estrogen receptor 100% positive, progesterone receptor 0% positive, with an MIB-1 of 22% and no HER-2 amplification.    (5)  PET scan on 03/03/2013 confirmed extensive metastatic adenopathy in the left axilla, left subpectoral musculature, and left supraclavicular region   (6)  started on anastrozole daily beginning 03/01/2013, interrupted during radiation therapy, resumed March 2015, discontinued March 2016 because of fatigue and arthralgias   (7) radiation therapy 03/31/2013 through 05/23/2013 Site/dose:   The patient was treated initially with a forward treatment planning technique to the left chest wall  in addition to treatment to the supraclavicular region. This consisted of a 3-D conformal technique. The patient was treated in this fashion to a dose of 50.4 gray. The patient then received a 14 gray boost treatment using an electron field. The total dose was 64.4 gray.   (8) Osteopenia, zometa yearly started 07/27/2014, but poorly tolerated.              (a) bone density scan on 12/27/13 showed a t-score of -1.8              (b) started Denosumab/Prolia April 2017, canceled after one dose per patient   (9) tamoxifen started 07/27/2014, stopped November 2019 (had total 5 years of antiestrogens)   (10) likely thalassemia trait   (11) left subpectoral soft tissue nodule noted 07/17/2015 unchanged through serial scans, most recent 20/80/2233, with no metabolic activity noted in PET scan of 2016 and interval calcification.  This is presumed benign   (12)  ADDITIONAL REGIONAL RECURRENCE:             (A) CT of the chest with contrast 03/05/2021 obtained to evaluate left upper extremity lymphedema as showed acute pulmonary emboli.  There was also left axillary and subpectoral lymphadenopathy             (B) rivaroxaban started 03/05/2021             (C) left breast ultrasound 03/29/2021 shows 2 areas in the infraclavicular tissue consistent with fat necrosis.  There were however multiple retropectoral interpectoral and left axillary masses.             (D) left axillary lymph node biopsy 04/04/2021: IDC  grade 3, ER positive, 100%, strong staining intensity, PR positive, 20%, weak staining intensity, Ki-67 of 15%, HER2 positive by IHC, ratio 2.90             (E) Fulvestrant every 4 weeks beginning 04/18/2021             (F) Herceptin every 3 weeks beginning 05/30/2021  Later changed both to every 4 weeks for better appointment schedule and patient convenience.    CURRENT THERAPY: Trastuzumab & Fulvestrant  INTERVAL HISTORY:  Tami Schmidt 88 y.o. female returns for follow-up of her estrogen  positive progesterone positive and HER2 positive breast Schmidt recurrence diagnosed in November 2022.    She is receiving herceptin and fulvestrant every 4 weeks. She complains of fatigue, otherwise no side effects. NO change in breathing, bowels are moving ok with stool softeners. No change in urinary habits NO headaches, double vision or falls. Rest of the pertinent 10 point ROS reviewed and negative.   Patient Active Problem List   Diagnosis Date Noted   Genetic testing 07/24/2021   Microcytosis 04/29/2021   Hypokalemia 04/28/2021   SBO (small bowel obstruction) ([place]) 04/27/2021   Pulmonary embolism ([place])    Agnosia 08/09/2020   Hardening of the aorta (main artery of the heart) ([place]) 08/09/2020   Morbid obesity ([place]) 08/09/2020   Neuropathy 08/09/2020   Primary osteoarthritis 08/09/2020   Sensorineural hearing loss (SNHL) of both ears 08/07/2020   Anosmia 06/05/2020   Tinnitus, bilateral 06/05/2020   GI bleed 07/13/2019   Acute blood loss anemia 07/12/2019   Microcytic anemia 01/05/2018   Melena 01/04/2018   Lower GI bleed 01/04/2018   Type 2 diabetes mellitus without complication ([place]) 92/92/4462   Bacteremia due to methicillin susceptible Staphylococcus aureus (MSSA) 12/02/2017   Tenosynovitis of left wrist 12/02/2017   Infection of left wrist ([place]) 11/30/2017   Primary osteoarthritis of right hip 01/05/2017   Osteoarthritis of right hip 01/03/2017   Lymphedema of upper extremity 01/17/2014   Osteopenia 01/17/2014   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   Dilated pore of [place] 08/02/2013   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[place]) 03/25/2013   Diabetes mellitus    fasting 90-100   HTN (hypertension) 03/25/2013   Hx of radiation therapy    breasts hx   Hypercholesterolemia    Hypertension    Lymphedema of arm    left arm   Pulmonary embolism ([place])    Type II or unspecified type diabetes mellitus without mention of complication, not stated as uncontrolled 03/25/2013    SURGICAL HISTORY: Past Surgical History:  Procedure Laterality Date   ABDOMINAL HYSTERECTOMY     ANKLE SURGERY Right 1995   APPENDECTOMY     BIOPSY  01/07/2018   Procedure: BIOPSY;  Surgeon: [name], MD;  Location: [facility];  Service: Gastroenterology;;  BREAST SURGERY Bilateral    mastectomy   COLONOSCOPY WITH PROPOFOL N/A 01/07/2018   Procedure: COLONOSCOPY WITH PROPOFOL;  Surgeon: [name], MD;  Location: [facility];  Service: Gastroenterology;  Laterality: N/A;   ESOPHAGOGASTRODUODENOSCOPY (EGD) WITH PROPOFOL N/A 01/06/2018   Procedure: ESOPHAGOGASTRODUODENOSCOPY (EGD) WITH PROPOFOL;  Surgeon: [name], MD;  Location: [facility];  Service: Gastroenterology;  Laterality: N/A;   HERNIA REPAIR  04-26-2010   IR IMAGING GUIDED PORT INSERTION  10/29/2021   MASS EXCISION Left 01/26/2013   Procedure: EXCISION LEFT CHEST WALL MASS AND LEFT ABDOMNAL WALL MASS;  Surgeon: Harl Bowie, MD;  Location: [facility];  Service: General;  Laterality: Left;   MASS EXCISION Left 09/20/2014   Procedure: EXCISION OF LEFT CHEST WALL MASS;  Surgeon: Coralie Keens, MD;  Location: [facility];  Service: General;  Laterality: Left;   TEE WITHOUT CARDIOVERSION N/A 12/08/2017    Procedure: TRANSESOPHAGEAL ECHOCARDIOGRAM (TEE);  Surgeon: Lelon Perla, MD;  Location: [facility];  Service: Cardiovascular;  Laterality: N/A;   TOTAL HIP ARTHROPLASTY Right 01/05/2017   TOTAL HIP ARTHROPLASTY Right 01/05/2017   Procedure: TOTAL HIP ARTHROPLASTY ANTERIOR APPROACH;  Surgeon: Frederik Pear, MD;  Location: [facility];  Service: Orthopedics;  Laterality: Right;    SOCIAL HISTORY: Social History   Socioeconomic History   Marital status: Married    Spouse name: Not on file   Number of children: Not on file   Years of education: Not on file   Highest education level: Not on file  Occupational History   Occupation: retired Education officer, museum  Tobacco Use   Smoking status: Former    Types: Cigarettes    Quit date: 04/14/1972    Years since quitting: 49.7   Smokeless tobacco: Never  Vaping Use   Vaping Use: Never used  Substance and Sexual Activity   Alcohol use: Yes    Comment: occasional   Drug use: Not Currently   Sexual activity: Yes    Birth control/protection: Surgical  Other Topics Concern   Not on file  Social History Narrative   Not on file   Social Determinants of Health   Financial Resource Strain: Not on file  Food Insecurity: Not on file  Transportation Needs: Not on file  Physical Activity: Not on file  Stress: Not on file  Social Connections: Not on file  Intimate Partner Violence: Not on file    FAMILY HISTORY: Family History  Problem Relation Age of Onset   Breast Schmidt Cousin        maternal first cousin   Uterine Schmidt Cousin        maternal first cousin    Review of Systems  Constitutional:  Negative for appetite change, chills, fatigue, fever and unexpected weight change.  HENT:   Negative for hearing loss, lump/mass and trouble swallowing.   Eyes:  Negative for eye problems and icterus.  Respiratory:  Negative for chest tightness, cough and shortness of breath.   Cardiovascular:  Negative for chest pain, leg swelling and  palpitations.  Gastrointestinal:  Negative for abdominal distention, abdominal pain, constipation, diarrhea, nausea and vomiting.  Endocrine: Negative for hot flashes.  Genitourinary:  Negative for difficulty urinating.   Musculoskeletal:  Negative for arthralgias.  Skin:  Negative for itching and rash.  Neurological:  Negative for dizziness, extremity weakness, headaches and numbness.  Hematological:  Negative for adenopathy. Does not bruise/bleed easily.  Psychiatric/Behavioral:  Negative for depression. The patient is not nervous/anxious.  PHYSICAL EXAMINATION  ECOG PERFORMANCE STATUS: 1 - Symptomatic but completely ambulatory  Vitals:   01/09/22 1359  BP: (!) 160/55  Pulse: 73  Resp: 18  Temp: (!) 97 F (36.1 C)  SpO2: 100%     Physical Exam Constitutional:      General: She is not in acute distress.    Appearance: Normal appearance. She is not toxic-appearing.  HENT:     Head: Normocephalic and atraumatic.  Eyes:     General: No scleral icterus. Cardiovascular:     Rate and Rhythm: Normal rate and regular rhythm.     Pulses: Normal pulses.     Heart sounds: Normal heart sounds.  Pulmonary:     Effort: Pulmonary effort is normal.     Breath sounds: Normal breath sounds.  Chest:       Comments: Abnormal density in marked area. No significant change.  Abdominal:     General: Abdomen is flat. Bowel sounds are normal. There is no distension.     Palpations: Abdomen is soft.     Tenderness: There is no abdominal tenderness.  Musculoskeletal:        General: Swelling (Left upper extremity swelling) present. No tenderness (No palpable or reproducible tenderness in the area of pain.).     Cervical back: Neck supple.  Lymphadenopathy:     Cervical: No cervical adenopathy.  Skin:    General: Skin is warm and dry.     Findings: No rash.     Comments: Skin nodules do not demonstrate any change since last visit  Neurological:     General: No focal deficit  present.     Mental Status: She is alert.  Psychiatric:        Mood and Affect: Mood normal.        Behavior: Behavior normal.   No change in physical exam overall  LABORATORY DATA:  CBC    Component Value Date/Time   WBC 6.2 01/09/2022 1339   WBC 6.3 12/10/2021 1322   RBC 4.65 01/09/2022 1339   HGB 12.0 01/09/2022 1339   HGB 10.5 (L) 01/20/2017 1057   HCT 36.8 01/09/2022 1339   HCT 25.8 (L) 01/05/2018 0513   HCT 32.8 (L) 01/20/2017 1057   PLT 218 01/09/2022 1339   PLT 368 01/20/2017 1057   MCV 79.1 (L) 01/09/2022 1339   MCV 77.8 (L) 01/20/2017 1057   MCH 25.8 (L) 01/09/2022 1339   MCHC 32.6 01/09/2022 1339   RDW 15.7 (H) 01/09/2022 1339   RDW 16.0 (H) 01/20/2017 1057   LYMPHSABS 1.9 01/09/2022 1339   LYMPHSABS 1.3 01/20/2017 1057   MONOABS 0.5 01/09/2022 1339   MONOABS 0.4 01/20/2017 1057   EOSABS 0.2 01/09/2022 1339   EOSABS 0.1 01/20/2017 1057   BASOSABS 0.0 01/09/2022 1339   BASOSABS 0.1 01/20/2017 1057    CMP     Component Value Date/Time   NA 139 12/11/2021 1227   NA 139 01/20/2017 1057   K 4.0 12/11/2021 1227   K 3.9 01/20/2017 1057   CL 104 12/11/2021 1227   CO2 28 12/11/2021 1227   CO2 25 01/20/2017 1057   GLUCOSE 158 (H) 12/11/2021 1227   GLUCOSE 135 01/20/2017 1057   BUN 21 12/11/2021 1227   BUN 15.6 01/20/2017 1057   CREATININE 0.78 12/11/2021 1227   CREATININE 0.7 01/20/2017 1057   CALCIUM 9.3 12/11/2021 1227   CALCIUM 9.3 01/20/2017 1057   PROT 6.7 12/11/2021 1227   PROT 7.1 01/20/2017 1057  ALBUMIN 4.1 12/11/2021 1227   ALBUMIN 3.4 (L) 01/20/2017 1057   AST 10 (L) 12/11/2021 1227   AST 10 01/20/2017 1057   ALT 7 12/11/2021 1227   ALT 14 01/20/2017 1057   ALKPHOS 52 12/11/2021 1227   ALKPHOS 69 01/20/2017 1057   BILITOT 0.5 12/11/2021 1227   BILITOT 0.43 01/20/2017 1057   GFRNONAA >60 12/11/2021 1227   GFRAA >60 07/14/2019 0538     ASSESSMENT and THERAPY PLAN:   1) status post right lumpectomy and axillary dissection in 1993  in [place] for what appears to have been a stage I breast Schmidt, treated with radiation and then tamoxifen for 5 years   (2) status post right mastectomy 2002 for a recurrence in the right breast, treated adjuvantly with tamoxifen for an additional 5 years   (3) status post left mastectomy and sentinel lymph node sampling 03/07/2003 for a lower inner quadrant pT1b, pN0, stage IA mucinous breast Schmidt, grade 2; no radiation therapy was given. Tamoxifen was continued until 2009   RECURRENT DISEASE/ LEFT: OCTOBER 2014 (4) excisional biopsy of a left chest wall mass 01/26/2013 showing carcinoma consistent with invasive ductal carcinoma, estrogen receptor 100% positive, progesterone receptor 0% positive, with an MIB-1 of 22% and no HER-2 amplification.    (5)  PET scan on 03/03/2013 confirmed extensive metastatic adenopathy in the left axilla, left subpectoral musculature, and left supraclavicular region   (6)  started on anastrozole daily beginning 03/01/2013, interrupted during radiation therapy, resumed March 2015, discontinued March 2016 because of fatigue and arthralgias   (7) radiation therapy 03/31/2013 through 05/23/2013 Site/dose:   The patient was treated initially with a forward treatment planning technique to the left chest wall in addition to treatment to the supraclavicular region. This consisted of a 3-D conformal technique. The patient was treated in this fashion to a dose of 50.4 gray. The patient then received a 14 gray boost treatment using an electron field. The total dose was 64.4 gray.   (8) Osteopenia, zometa yearly started 07/27/2014, but poorly tolerated.              (a) bone density scan on 12/27/13 showed a t-score of -1.8              (b) started Denosumab/Prolia April 2017, canceled after one dose per patient   (9) tamoxifen started 07/27/2014, stopped November 2019 (had total 5 years of antiestrogens)   (10) likely thalassemia trait   (11) left subpectoral soft  tissue nodule noted 07/17/2015 unchanged through serial scans, most recent 16/01/9603, with no metabolic activity noted in PET scan of 2016 and interval calcification.  This is presumed benign   (12)  ADDITIONAL REGIONAL RECURRENCE:             (A) CT of the chest with contrast 03/05/2021 obtained to evaluate left upper extremity lymphedema as showed acute pulmonary emboli.  There was also left axillary and subpectoral lymphadenopathy             (B) rivaroxaban started 03/05/2021             (C) left breast ultrasound 03/29/2021 shows 2 areas in the infraclavicular tissue consistent with fat necrosis.  There were however multiple retropectoral interpectoral and left axillary masses.             (D) left axillary lymph node biopsy 04/04/2021   She had biopsy of the left axillary lymph node mass which showed invasive ductal carcinoma, grade 3, ER positive, 100%,  strong staining intensity, PR positive, 20%, weak staining intensity, Ki-67 of 15%, HER2 positive by IHC, ratio 2.90 She was initially scheduled for fulvestrant however given her to positive breast Schmidt, we have discussed about additional therapy today.  Most recent CT showed stable disease   PLAN:   She is on herceptin and faslodex every 28 days, more for convenience since Herceptin is every 21 days and Faslodex every 28 days, she had to deal with multiple appointments Last imaging showed stable disease.   We will continue this current combination until progression or unacceptable toxicity, She will RTC every 4 weeks for treatment, every 8 weeks for visit ECHO every 3 months She should consider anticoagulation indefinitely  Total encounter time: 30 minutes in face-to-face visit time, chart review, lab review, care coordination, order entry, and documentation of the encounter.  *Total Encounter Time as defined by the Centers for Medicare and Medicaid Services includes, in addition to the face-to-face time of a patient visit  (documented in the note above) non-face-to-face time: obtaining and reviewing outside history, ordering and reviewing medications, tests or procedures, care coordination (communications with other health care professionals or caregivers) and documentation in the medical record.

## 2022-01-10 LAB — CANCER ANTIGEN 27.29: CA 27.29: 36.4 U/mL (ref 0.0–38.6)

## 2022-01-15 ENCOUNTER — Ambulatory Visit (HOSPITAL_COMMUNITY)
Admission: RE | Admit: 2022-01-15 | Discharge: 2022-01-15 | Disposition: A | Discharge status: Home / Self Care | Payer: Medicare Other | Source: Ambulatory Visit | Attending: Hematology and Oncology | Admitting: Hematology and Oncology

## 2022-01-15 DIAGNOSIS — C50312 Malignant neoplasm of lower-inner quadrant of left female breast: Secondary | ICD-10-CM | POA: Diagnosis present

## 2022-01-15 DIAGNOSIS — Z170 Estrogen receptor positive status [ER+]: Secondary | ICD-10-CM | POA: Diagnosis not present

## 2022-01-15 DIAGNOSIS — E119 Type 2 diabetes mellitus without complications: Secondary | ICD-10-CM | POA: Diagnosis not present

## 2022-01-15 DIAGNOSIS — Z0189 Encounter for other specified special examinations: Secondary | ICD-10-CM

## 2022-01-15 DIAGNOSIS — E785 Hyperlipidemia, unspecified: Secondary | ICD-10-CM | POA: Diagnosis not present

## 2022-01-15 DIAGNOSIS — I119 Hypertensive heart disease without heart failure: Secondary | ICD-10-CM | POA: Insufficient documentation

## 2022-01-15 LAB — ECHOCARDIOGRAM COMPLETE
AR max vel: 2.46 cm2
AV Peak grad: 7.6 mmHg
Ao pk vel: 1.37 m/s
Area-P 1/2: 4.8 cm2
S' Lateral: 2.5 cm

## 2022-02-05 ENCOUNTER — Other Ambulatory Visit: Payer: Self-pay | Admitting: *Deleted

## 2022-02-05 DIAGNOSIS — C50919 Malignant neoplasm of unspecified site of unspecified female breast: Secondary | ICD-10-CM

## 2022-02-05 DIAGNOSIS — Z170 Estrogen receptor positive status [ER+]: Secondary | ICD-10-CM

## 2022-02-06 ENCOUNTER — Inpatient Hospital Stay: Payer: Medicare Other | Attending: Adult Health

## 2022-02-06 ENCOUNTER — Other Ambulatory Visit: Payer: Self-pay | Admitting: Hematology and Oncology

## 2022-02-06 ENCOUNTER — Other Ambulatory Visit: Payer: Self-pay

## 2022-02-06 ENCOUNTER — Inpatient Hospital Stay: Payer: Medicare Other

## 2022-02-06 VITALS — BP 142/61 | HR 65 | Temp 97.7°F | Resp 18

## 2022-02-06 DIAGNOSIS — Z170 Estrogen receptor positive status [ER+]: Secondary | ICD-10-CM | POA: Diagnosis not present

## 2022-02-06 DIAGNOSIS — C773 Secondary and unspecified malignant neoplasm of axilla and upper limb lymph nodes: Secondary | ICD-10-CM | POA: Diagnosis not present

## 2022-02-06 DIAGNOSIS — C50312 Malignant neoplasm of lower-inner quadrant of left female breast: Secondary | ICD-10-CM

## 2022-02-06 DIAGNOSIS — Z79899 Other long term (current) drug therapy: Secondary | ICD-10-CM | POA: Diagnosis not present

## 2022-02-06 DIAGNOSIS — M8580 Other specified disorders of bone density and structure, unspecified site: Secondary | ICD-10-CM | POA: Insufficient documentation

## 2022-02-06 DIAGNOSIS — Z9013 Acquired absence of bilateral breasts and nipples: Secondary | ICD-10-CM | POA: Insufficient documentation

## 2022-02-06 DIAGNOSIS — Z5112 Encounter for antineoplastic immunotherapy: Secondary | ICD-10-CM | POA: Insufficient documentation

## 2022-02-06 DIAGNOSIS — Z5111 Encounter for antineoplastic chemotherapy: Secondary | ICD-10-CM | POA: Diagnosis present

## 2022-02-06 DIAGNOSIS — C50919 Malignant neoplasm of unspecified site of unspecified female breast: Secondary | ICD-10-CM

## 2022-02-06 LAB — CMP (CANCER CENTER ONLY)
ALT: 8 U/L (ref 0–44)
AST: 10 U/L — ABNORMAL LOW (ref 15–41)
Albumin: 4 g/dL (ref 3.5–5.0)
Alkaline Phosphatase: 57 U/L (ref 38–126)
Anion gap: 7 (ref 5–15)
BUN: 23 mg/dL (ref 8–23)
CO2: 28 mmol/L (ref 22–32)
Calcium: 9.1 mg/dL (ref 8.9–10.3)
Chloride: 105 mmol/L (ref 98–111)
Creatinine: 0.75 mg/dL (ref 0.44–1.00)
GFR, Estimated: >60 mL/min (ref >60)
Glucose, Bld: 143 mg/dL — ABNORMAL HIGH (ref 70–99)
Potassium: 3.8 mmol/L (ref 3.5–5.1)
Sodium: 140 mmol/L (ref 135–145)
Total Bilirubin: 0.4 mg/dL (ref 0.3–1.2)
Total Protein: 6.8 g/dL (ref 6.5–8.1)

## 2022-02-06 LAB — CBC WITH DIFFERENTIAL (CANCER CENTER ONLY)
Abs Immature Granulocytes: 0.02 10*3/uL (ref 0.00–0.07)
Basophils Absolute: 0 10*3/uL (ref 0.0–0.1)
Basophils Relative: 1 %
Eosinophils Absolute: 0.2 10*3/uL (ref 0.0–0.5)
Eosinophils Relative: 3 %
HCT: 37.6 % (ref 36.0–46.0)
Hemoglobin: 12.1 g/dL (ref 12.0–15.0)
Immature Granulocytes: 0 %
Lymphocytes Relative: 26 %
Lymphs Abs: 1.6 10*3/uL (ref 0.7–4.0)
MCH: 25.7 pg — ABNORMAL LOW (ref 26.0–34.0)
MCHC: 32.2 g/dL (ref 30.0–36.0)
MCV: 80 fL (ref 80.0–100.0)
Monocytes Absolute: 0.4 10*3/uL (ref 0.1–1.0)
Monocytes Relative: 7 %
Neutro Abs: 3.9 10*3/uL (ref 1.7–7.7)
Neutrophils Relative %: 63 %
Platelet Count: 229 10*3/uL (ref 150–400)
RBC: 4.7 MIL/uL (ref 3.87–5.11)
RDW: 15.6 % — ABNORMAL HIGH (ref 11.5–15.5)
WBC Count: 6.1 10*3/uL (ref 4.0–10.5)
nRBC: 0 % (ref 0.0–0.2)

## 2022-02-06 LAB — HEMOGLOBIN A1C
Hgb A1c MFr Bld: 6.5 % — ABNORMAL HIGH (ref 4.8–5.6)
Mean Plasma Glucose: 139.85 mg/dL

## 2022-02-06 MED ORDER — HEPARIN SOD (PORK) LOCK FLUSH 100 UNIT/ML IV SOLN
500.0000 [IU] | Freq: Once | INTRAVENOUS | Status: AC | PRN
  Administered 2022-02-06: 500 [IU]

## 2022-02-06 MED ORDER — TRASTUZUMAB-ANNS CHEMO 150 MG IV SOLR
6.0000 mg/kg | Freq: Once | INTRAVENOUS | Status: AC
  Administered 2022-02-06: 588 mg via INTRAVENOUS
  Filled 2022-02-06: qty 28

## 2022-02-06 MED ORDER — DIPHENHYDRAMINE HCL 25 MG PO CAPS
50.0000 mg | ORAL_CAPSULE | Freq: Once | ORAL | Status: AC
  Administered 2022-02-06: 50 mg via ORAL
  Filled 2022-02-06: qty 2

## 2022-02-06 MED ORDER — FULVESTRANT 250 MG/5ML IM SOSY
500.0000 mg | PREFILLED_SYRINGE | INTRAMUSCULAR | Status: DC
  Administered 2022-02-06: 500 mg via INTRAMUSCULAR
  Filled 2022-02-06: qty 10

## 2022-02-06 MED ORDER — SODIUM CHLORIDE 0.9% FLUSH
10.0000 mL | INTRAVENOUS | Status: DC | PRN
  Administered 2022-02-06: 10 mL

## 2022-02-06 MED ORDER — SODIUM CHLORIDE 0.9 % IV SOLN: Freq: Once | INTRAVENOUS | Status: AC

## 2022-02-06 MED ORDER — ACETAMINOPHEN 325 MG PO TABS
650.0000 mg | ORAL_TABLET | Freq: Once | ORAL | Status: AC
  Administered 2022-02-06: 650 mg via ORAL
  Filled 2022-02-06: qty 2

## 2022-02-06 NOTE — Patient Instructions (Signed)
[facility]COL[facility]  Discharge Instructions: Thank you for choosing [facility] to provide your oncology and hematology care.   If you have a lab appointment with the [facility], please go directly to the [facility] and check in at the registration area.   Wear comfortable clothing and clothing appropriate for easy access to any Portacath or PICC line.   We strive to give you quality time with your provider. You may need to reschedule your appointment if you arrive late (15 or more minutes).  Arriving late affects you and other patients whose appointments are after yours.  Also, if you miss three or more appointments without notifying the office, you may be dismissed from the clinic at the provider's discretion.      For prescription refill requests, have your pharmacy contact our office and allow 72 hours for refills to be completed.    Today you received the following chemotherapy and/or immunotherapy agents: Kanjinti.      To help prevent nausea and vomiting after your treatment, we encourage you to take your nausea medication as directed.  BELOW ARE SYMPTOMS THAT SHOULD BE REPORTED IMMEDIATELY: *FEVER GREATER THAN 100.4 F (38 C) OR HIGHER *CHILLS OR SWEATING *NAUSEA AND VOMITING THAT IS NOT CONTROLLED WITH YOUR NAUSEA MEDICATION *UNUSUAL SHORTNESS OF BREATH *UNUSUAL BRUISING OR BLEEDING *URINARY PROBLEMS (pain or burning when urinating, or frequent urination) *BOWEL PROBLEMS (unusual diarrhea, constipation, pain near the anus) TENDERNESS IN MOUTH AND THROAT WITH OR WITHOUT PRESENCE OF ULCERS (sore throat, sores in mouth, or a toothache) UNUSUAL RASH, SWELLING OR PAIN  UNUSUAL VAGINAL DISCHARGE OR ITCHING   Items with * indicate a potential emergency and should be followed up as soon as possible or go to the Emergency Department if any problems should occur.  Please show the CHEMOTHERAPY ALERT CARD or IMMUNOTHERAPY ALERT CARD at check-in to  the Emergency Department and triage nurse.  Should you have questions after your visit or need to cancel or reschedule your appointment, please contact [facility]  Dept: [phone]  and follow the prompts.  Office hours are 8:00 a.m. to 4:30 p.m. Monday - Friday. Please note that voicemails left after 4:00 p.m. may not be returned until the following business day.  We are closed weekends and major holidays. You have access to a nurse at all times for urgent questions. Please call the main number to the clinic Dept: [phone] and follow the prompts.   For any non-urgent questions, you may also contact your provider using MyChart. We now offer e-Visits for anyone 37 and older to request care online for non-urgent symptoms. For details visit [email or web address].   Also download the MyChart app! Go to the app store, search "MyChart", open the app, select [facility], and log in with your MyChart username and password.  Masks are optional in the cancer centers. If you would like for your care team to wear a mask while they are taking care of you, please let them know. You may have one support person who is at least 86 years old accompany you for your appointments.

## 2022-02-07 LAB — CANCER ANTIGEN 27.29: CA 27.29: 41.5 U/mL — ABNORMAL HIGH (ref 0.0–38.6)

## 2022-03-05 ENCOUNTER — Other Ambulatory Visit: Payer: Self-pay

## 2022-03-05 ENCOUNTER — Inpatient Hospital Stay: Payer: Medicare Other | Attending: Adult Health

## 2022-03-05 ENCOUNTER — Inpatient Hospital Stay (HOSPITAL_BASED_OUTPATIENT_CLINIC_OR_DEPARTMENT_OTHER): Payer: Medicare Other | Admitting: Hematology and Oncology

## 2022-03-05 ENCOUNTER — Inpatient Hospital Stay: Payer: Medicare Other

## 2022-03-05 ENCOUNTER — Encounter: Payer: Self-pay | Admitting: Hematology and Oncology

## 2022-03-05 VITALS — BP 152/51 | HR 83 | Temp 97.8°F | Resp 16 | Ht 67.0 in | Wt 199.8 lb

## 2022-03-05 DIAGNOSIS — C50312 Malignant neoplasm of lower-inner quadrant of left female breast: Secondary | ICD-10-CM | POA: Diagnosis present

## 2022-03-05 DIAGNOSIS — C773 Secondary and unspecified malignant neoplasm of axilla and upper limb lymph nodes: Secondary | ICD-10-CM | POA: Diagnosis not present

## 2022-03-05 DIAGNOSIS — Z86711 Personal history of pulmonary embolism: Secondary | ICD-10-CM | POA: Diagnosis not present

## 2022-03-05 DIAGNOSIS — C50919 Malignant neoplasm of unspecified site of unspecified female breast: Secondary | ICD-10-CM

## 2022-03-05 DIAGNOSIS — C7951 Secondary malignant neoplasm of bone: Secondary | ICD-10-CM | POA: Diagnosis not present

## 2022-03-05 DIAGNOSIS — M8580 Other specified disorders of bone density and structure, unspecified site: Secondary | ICD-10-CM | POA: Diagnosis not present

## 2022-03-05 DIAGNOSIS — Z170 Estrogen receptor positive status [ER+]: Secondary | ICD-10-CM | POA: Insufficient documentation

## 2022-03-05 DIAGNOSIS — Z5112 Encounter for antineoplastic immunotherapy: Secondary | ICD-10-CM | POA: Insufficient documentation

## 2022-03-05 DIAGNOSIS — Z5111 Encounter for antineoplastic chemotherapy: Secondary | ICD-10-CM | POA: Insufficient documentation

## 2022-03-05 LAB — CMP (CANCER CENTER ONLY)
ALT: 10 U/L (ref 0–44)
AST: 15 U/L (ref 15–41)
Albumin: 3.9 g/dL (ref 3.5–5.0)
Alkaline Phosphatase: 51 U/L (ref 38–126)
Anion gap: 7 (ref 5–15)
BUN: 22 mg/dL (ref 8–23)
CO2: 26 mmol/L (ref 22–32)
Calcium: 9.1 mg/dL (ref 8.9–10.3)
Chloride: 104 mmol/L (ref 98–111)
Creatinine: 0.71 mg/dL (ref 0.44–1.00)
GFR, Estimated: >60 mL/min (ref >60)
Glucose, Bld: 159 mg/dL — ABNORMAL HIGH (ref 70–99)
Potassium: 3.9 mmol/L (ref 3.5–5.1)
Sodium: 137 mmol/L (ref 135–145)
Total Bilirubin: 0.5 mg/dL (ref 0.3–1.2)
Total Protein: 7 g/dL (ref 6.5–8.1)

## 2022-03-05 LAB — CBC WITH DIFFERENTIAL (CANCER CENTER ONLY)
Abs Immature Granulocytes: 0.01 10*3/uL (ref 0.00–0.07)
Basophils Absolute: 0 10*3/uL (ref 0.0–0.1)
Basophils Relative: 1 %
Eosinophils Absolute: 0.3 10*3/uL (ref 0.0–0.5)
Eosinophils Relative: 4 %
HCT: 36.9 % (ref 36.0–46.0)
Hemoglobin: 12 g/dL (ref 12.0–15.0)
Immature Granulocytes: 0 %
Lymphocytes Relative: 28 %
Lymphs Abs: 1.7 10*3/uL (ref 0.7–4.0)
MCH: 26.1 pg (ref 26.0–34.0)
MCHC: 32.5 g/dL (ref 30.0–36.0)
MCV: 80.2 fL (ref 80.0–100.0)
Monocytes Absolute: 0.4 10*3/uL (ref 0.1–1.0)
Monocytes Relative: 7 %
Neutro Abs: 3.7 10*3/uL (ref 1.7–7.7)
Neutrophils Relative %: 60 %
Platelet Count: 230 10*3/uL (ref 150–400)
RBC: 4.6 MIL/uL (ref 3.87–5.11)
RDW: 15.3 % (ref 11.5–15.5)
WBC Count: 6.1 10*3/uL (ref 4.0–10.5)
nRBC: 0 % (ref 0.0–0.2)

## 2022-03-05 MED ORDER — HEPARIN SOD (PORK) LOCK FLUSH 100 UNIT/ML IV SOLN
500.0000 [IU] | Freq: Once | INTRAVENOUS | Status: AC | PRN
  Administered 2022-03-05: 500 [IU]

## 2022-03-05 MED ORDER — DIPHENHYDRAMINE HCL 25 MG PO CAPS
50.0000 mg | ORAL_CAPSULE | Freq: Once | ORAL | Status: AC
  Administered 2022-03-05: 50 mg via ORAL
  Filled 2022-03-05: qty 2

## 2022-03-05 MED ORDER — TRASTUZUMAB-ANNS CHEMO 150 MG IV SOLR
6.0000 mg/kg | Freq: Once | INTRAVENOUS | Status: AC
  Administered 2022-03-05: 588 mg via INTRAVENOUS
  Filled 2022-03-05: qty 28

## 2022-03-05 MED ORDER — FULVESTRANT 250 MG/5ML IM SOSY
500.0000 mg | PREFILLED_SYRINGE | INTRAMUSCULAR | Status: DC
  Administered 2022-03-05: 500 mg via INTRAMUSCULAR
  Filled 2022-03-05: qty 10

## 2022-03-05 MED ORDER — SODIUM CHLORIDE 0.9% FLUSH
10.0000 mL | Freq: Once | INTRAVENOUS | Status: AC
  Administered 2022-03-05: 10 mL

## 2022-03-05 MED ORDER — SODIUM CHLORIDE 0.9% FLUSH
10.0000 mL | INTRAVENOUS | Status: DC | PRN
  Administered 2022-03-05: 10 mL

## 2022-03-05 MED ORDER — SODIUM CHLORIDE 0.9 % IV SOLN: Freq: Once | INTRAVENOUS | Status: AC

## 2022-03-05 MED ORDER — ACETAMINOPHEN 325 MG PO TABS
650.0000 mg | ORAL_TABLET | Freq: Once | ORAL | Status: AC
  Administered 2022-03-05: 650 mg via ORAL
  Filled 2022-03-05: qty 2

## 2022-03-05 NOTE — Progress Notes (Signed)
Tami Schmidt Follow up:    Tami Han, MD (Inactive) No address on file   DIAGNOSIS:  Schmidt Staging  Chest wall recurrence of breast Schmidt Tami Schmidt) Staging form: Breast, AJCC 7th Edition - Clinical: Stage Unknown ([place], N1, M0) - Signed by Chauncey Cruel, MD on 07/27/2014  Malignant neoplasm of lower-inner quadrant of left breast in female, estrogen receptor positive ([place]) Staging form: Breast, AJCC 7th Edition - Clinical: Stage IA (T1b, N0, M0) - Signed by Chauncey Cruel, MD on 07/27/2014   SUMMARY OF ONCOLOGIC HISTORY:  88 y.o. Tami Schmidt woman   (1) status post right lumpectomy and axillary dissection in 1993 in [place] for what appears to have been a stage I breast Schmidt, treated with radiation and then tamoxifen for 5 years   (2) status post right mastectomy 2002 for a recurrence in the right breast, treated adjuvantly with tamoxifen for an additional 5 years   (3) status post left mastectomy and sentinel lymph node sampling 03/07/2003 for a lower inner quadrant pT1b, pN0, stage IA mucinous breast Schmidt, grade 2; no radiation therapy was given. Tamoxifen was continued until 2009   RECURRENT DISEASE/ LEFT: OCTOBER 2014 (4) excisional biopsy of a left chest wall mass 01/26/2013 showing carcinoma consistent with invasive ductal carcinoma, estrogen receptor 100% positive, progesterone receptor 0% positive, with an MIB-1 of 22% and no HER-2 amplification.    (5)  PET scan on 03/03/2013 confirmed extensive metastatic adenopathy in the left axilla, left subpectoral musculature, and left supraclavicular region   (6)  started on anastrozole daily beginning 03/01/2013, interrupted during radiation therapy, resumed March 2015, discontinued March 2016 because of fatigue and arthralgias   (7) radiation therapy 03/31/2013 through 05/23/2013 Site/dose:   The patient was treated initially with a forward treatment planning technique to the left chest wall  in addition to treatment to the supraclavicular region. This consisted of a 3-D conformal technique. The patient was treated in this fashion to a dose of 50.4 gray. The patient then received a 14 gray boost treatment using an electron field. The total dose was 64.4 gray.   (8) Osteopenia, zometa yearly started 07/27/2014, but poorly tolerated.              (a) bone density scan on 12/27/13 showed a t-score of -1.8              (b) started Denosumab/Prolia April 2017, canceled after one dose per patient   (9) tamoxifen started 07/27/2014, stopped November 2019 (had total 5 years of antiestrogens)   (10) likely thalassemia trait   (11) left subpectoral soft tissue nodule noted 07/17/2015 unchanged through serial scans, most recent 20/80/2233, with no metabolic activity noted in PET scan of 2016 and interval calcification.  This is presumed benign   (12)  ADDITIONAL REGIONAL RECURRENCE:             (A) CT of the chest with contrast 03/05/2021 obtained to evaluate left upper extremity lymphedema as showed acute pulmonary emboli.  There was also left axillary and subpectoral lymphadenopathy             (B) rivaroxaban started 03/05/2021             (C) left breast ultrasound 03/29/2021 shows 2 areas in the infraclavicular tissue consistent with fat necrosis.  There were however multiple retropectoral interpectoral and left axillary masses.             (D) left axillary lymph node biopsy 04/04/2021: IDC  grade 3, ER positive, 100%, strong staining intensity, PR positive, 20%, weak staining intensity, Ki-67 of 15%, HER2 positive by IHC, ratio 2.90             (E) Fulvestrant every 4 weeks beginning 04/18/2021             (F) Herceptin every 3 weeks beginning 05/30/2021  Later changed both to every 4 weeks for better appointment schedule and patient convenience.    CURRENT THERAPY: Trastuzumab & Fulvestrant  INTERVAL HISTORY:  Tami Schmidt 88 y.o. female returns for follow-up of her estrogen  positive progesterone positive and HER2 positive breast Schmidt recurrence diagnosed in November 2022.    She is receiving herceptin and fulvestrant every 4 weeks. She complains of fatigue, she once again asks the same question how long this treatment is going on. She says her balance is a bit off this past month. She can't smell or taste for almost a yr now although she didn't complain of this before. No change in breathing, chest pain, chest pressure, falls. No new neurological complaints. She asked multiple times today about her current condition. She tells me that she can't remember much. Rest of the pertinent 10 point ROS reviewed and negative.   Patient Active Problem List   Diagnosis Date Noted   Genetic testing 07/24/2021   Microcytosis 04/29/2021   Hypokalemia 04/28/2021   SBO (small bowel obstruction) ([place]) 04/27/2021   Pulmonary embolism ([place])    Agnosia 08/09/2020   Hardening of the aorta (main artery of the heart) ([place]) 08/09/2020   Morbid obesity ([place]) 08/09/2020   Neuropathy 08/09/2020   Primary osteoarthritis 08/09/2020   Sensorineural hearing loss (SNHL) of both ears 08/07/2020   Anosmia 06/05/2020   Tinnitus, bilateral 06/05/2020   GI bleed 07/13/2019   Acute blood loss anemia 07/12/2019   Microcytic anemia 01/05/2018   Melena 01/04/2018   Lower GI bleed 01/04/2018   Type 2 diabetes mellitus without complication ([place]) 54/62/7035   Bacteremia due to methicillin susceptible Staphylococcus aureus (MSSA) 12/02/2017   Tenosynovitis of left wrist 12/02/2017   Infection of left wrist ([place]) 11/30/2017   Primary osteoarthritis of right hip 01/05/2017   Osteoarthritis of right hip 01/03/2017   Lymphedema of upper extremity 01/17/2014   Osteopenia 01/17/2014   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   Dilated pore of [place] 08/02/2013   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[place]) 03/25/2013   Diabetes mellitus    fasting 90-100   HTN (hypertension) 03/25/2013   Hx of radiation therapy    breasts hx   Hypercholesterolemia    Hypertension    Lymphedema of arm    left arm   Pulmonary embolism ([facility])    Type II or unspecified type diabetes mellitus without mention of complication, not stated as uncontrolled 03/25/2013    SURGICAL  HISTORY: Past Surgical History:  Procedure Laterality Date   ABDOMINAL HYSTERECTOMY     ANKLE SURGERY Right 1995   APPENDECTOMY     BIOPSY  01/07/2018   Procedure: BIOPSY;  Surgeon: [name], MD;  Location: [facility];  Service: Gastroenterology;;   BREAST SURGERY Bilateral    mastectomy   COLONOSCOPY WITH PROPOFOL N/A 01/07/2018   Procedure: COLONOSCOPY WITH PROPOFOL;  Surgeon: [name], MD;  Location: [facility];  Service: Gastroenterology;  Laterality: N/A;   ESOPHAGOGASTRODUODENOSCOPY (EGD) WITH PROPOFOL N/A 01/06/2018   Procedure: ESOPHAGOGASTRODUODENOSCOPY (EGD) WITH PROPOFOL;  Surgeon: [name], MD;  Location: [facility];  Service: Gastroenterology;  Laterality: N/A;   HERNIA REPAIR  04-26-2010   IR IMAGING GUIDED PORT INSERTION  10/29/2021   MASS EXCISION Left 01/26/2013   Procedure: EXCISION LEFT CHEST WALL MASS AND LEFT ABDOMNAL WALL MASS;  Surgeon: Harl Bowie, MD;  Location: [facility];  Service: General;   Laterality: Left;   MASS EXCISION Left 09/20/2014   Procedure: EXCISION OF LEFT CHEST WALL MASS;  Surgeon: Coralie Keens, MD;  Location: [facility];  Service: General;  Laterality: Left;   TEE WITHOUT CARDIOVERSION N/A 12/08/2017   Procedure: TRANSESOPHAGEAL ECHOCARDIOGRAM (TEE);  Surgeon: Lelon Perla, MD;  Location: [facility];  Service: Cardiovascular;  Laterality: N/A;   TOTAL HIP ARTHROPLASTY Right 01/05/2017   TOTAL HIP ARTHROPLASTY Right 01/05/2017   Procedure: TOTAL HIP ARTHROPLASTY ANTERIOR APPROACH;  Surgeon: Frederik Pear, MD;  Location: [facility];  Service: Orthopedics;  Laterality: Right;    SOCIAL HISTORY: Social History   Socioeconomic History   Marital status: Married    Spouse name: Not on file   Number of children: Not on file   Years of education: Not on file   Highest education level: Not on file  Occupational History   Occupation: retired Education officer, museum  Tobacco Use   Smoking status: Former    Types: Cigarettes    Quit date: 04/14/1972    Years since quitting: 49.9   Smokeless tobacco: Never  Vaping Use   Vaping Use: Never used  Substance and Sexual Activity   Alcohol use: Yes    Comment: occasional   Drug use: Not Currently   Sexual activity: Yes    Birth control/protection: Surgical  Other Topics Concern   Not on file  Social History Narrative   Not on file   Social Determinants of Health   Financial Resource Strain: Not on file  Food Insecurity: Not on file  Transportation Needs: Not on file  Physical Activity: Not on file  Stress: Not on file  Social Connections: Not on file  Intimate Partner Violence: Not on file    FAMILY HISTORY: Family History  Problem Relation Age of Onset   Breast Schmidt Cousin        maternal first cousin   Uterine Schmidt Cousin        maternal first cousin    Review of Systems  Constitutional:  Negative for appetite change, chills, fatigue, fever and unexpected weight change.  HENT:   Negative  for hearing loss, lump/mass and trouble swallowing.   Eyes:  Negative for eye problems and icterus.  Respiratory:  Negative for chest tightness, cough and shortness of breath.   Cardiovascular:  Negative for chest pain, leg swelling and palpitations.  Gastrointestinal:  Negative for abdominal distention, abdominal pain, constipation, diarrhea, nausea and vomiting.  Endocrine: Negative for hot flashes.  Genitourinary:  Negative for difficulty urinating.   Musculoskeletal:  Negative for arthralgias.  Skin:  Negative for itching and rash.  Neurological:  Negative for dizziness, extremity weakness, headaches and numbness.  Hematological:  Negative for adenopathy. Does not bruise/bleed easily.  Psychiatric/Behavioral:  Negative for depression. The patient is not nervous/anxious.      PHYSICAL EXAMINATION  ECOG PERFORMANCE STATUS: 1 - Symptomatic but completely ambulatory  Vitals:   03/05/22 1349  BP: (!) 152/51  Pulse: 83  Resp: 16  Temp: 97.8 F (36.6 C)  SpO2: 100%     Physical Exam Constitutional:      General: She is not in acute distress.    Appearance: Normal appearance. She is not toxic-appearing.  HENT:     Head: Normocephalic and atraumatic.  Eyes:     General: No scleral icterus. Cardiovascular:     Rate and Rhythm: Normal rate and regular rhythm.     Pulses: Normal pulses.     Heart sounds: Normal heart sounds.  Pulmonary:     Effort: Pulmonary effort is normal.     Breath sounds: Normal breath sounds.  Chest:       Comments: Abnormal density in marked area. No significant change.  Abdominal:     General: Abdomen is flat. Bowel sounds are normal. There is no distension.     Palpations: Abdomen is soft.     Tenderness: There is no abdominal tenderness.  Musculoskeletal:        General: Swelling (Left upper extremity swelling) present. No tenderness (No palpable or reproducible tenderness in the area of pain.).     Cervical back: Neck supple.   Lymphadenopathy:     Cervical: No cervical adenopathy.  Skin:    General: Skin is warm and dry.     Findings: No rash.     Comments: Skin nodules do not demonstrate any change since last visit  Neurological:     General: No focal deficit present.     Mental Status: She is alert.  Psychiatric:        Mood and Affect: Mood normal.        Behavior: Behavior normal.   No change in physical exam overall  LABORATORY DATA:  CBC    Component Value Date/Time   WBC 6.1 03/05/2022 1332   WBC 6.3 12/10/2021 1322   RBC 4.60 03/05/2022 1332   HGB 12.0 03/05/2022 1332   HGB 10.5 (L) 01/20/2017 1057   HCT 36.9 03/05/2022 1332   HCT 25.8 (L) 01/05/2018 0513   HCT 32.8 (L) 01/20/2017 1057   PLT 230 03/05/2022 1332   PLT 368 01/20/2017 1057   MCV 80.2 03/05/2022 1332   MCV 77.8 (L) 01/20/2017 1057   MCH 26.1 03/05/2022 1332   MCHC 32.5 03/05/2022 1332   RDW 15.3 03/05/2022 1332   RDW 16.0 (H) 01/20/2017 1057   LYMPHSABS 1.7 03/05/2022 1332   LYMPHSABS 1.3 01/20/2017 1057   MONOABS 0.4 03/05/2022 1332   MONOABS 0.4 01/20/2017 1057   EOSABS 0.3 03/05/2022 1332   EOSABS 0.1 01/20/2017 1057   BASOSABS 0.0 03/05/2022 1332   BASOSABS 0.1 01/20/2017 1057    CMP     Component Value Date/Time   NA 137 03/05/2022 1332   NA 139 01/20/2017 1057   K 3.9 03/05/2022 1332   K 3.9 01/20/2017 1057   CL 104 03/05/2022 1332   CO2 26 03/05/2022 1332   CO2 25 01/20/2017 1057   GLUCOSE 159 (H) 03/05/2022 1332   GLUCOSE 135 01/20/2017 1057   BUN 22 03/05/2022 1332  BUN 15.6 01/20/2017 1057   CREATININE 0.71 03/05/2022 1332   CREATININE 0.7 01/20/2017 1057   CALCIUM 9.1 03/05/2022 1332   CALCIUM 9.3 01/20/2017 1057   PROT 7.0 03/05/2022 1332   PROT 7.1 01/20/2017 1057   ALBUMIN 3.9 03/05/2022 1332   ALBUMIN 3.4 (L) 01/20/2017 1057   AST 15 03/05/2022 1332   AST 10 01/20/2017 1057   ALT 10 03/05/2022 1332   ALT 14 01/20/2017 1057   ALKPHOS 51 03/05/2022 1332   ALKPHOS 69 01/20/2017  1057   BILITOT 0.5 03/05/2022 1332   BILITOT 0.43 01/20/2017 1057   GFRNONAA >60 03/05/2022 1332   GFRAA >60 07/14/2019 0538     ASSESSMENT and THERAPY PLAN:   1) status post right lumpectomy and axillary dissection in 1993 in [place] for what appears to have been a stage I breast Schmidt, treated with radiation and then tamoxifen for 5 years   (2) status post right mastectomy 2002 for a recurrence in the right breast, treated adjuvantly with tamoxifen for an additional 5 years   (3) status post left mastectomy and sentinel lymph node sampling 03/07/2003 for a lower inner quadrant pT1b, pN0, stage IA mucinous breast Schmidt, grade 2; no radiation therapy was given. Tamoxifen was continued until 2009   RECURRENT DISEASE/ LEFT: OCTOBER 2014 (4) excisional biopsy of a left chest wall mass 01/26/2013 showing carcinoma consistent with invasive ductal carcinoma, estrogen receptor 100% positive, progesterone receptor 0% positive, with an MIB-1 of 22% and no HER-2 amplification.    (5)  PET scan on 03/03/2013 confirmed extensive metastatic adenopathy in the left axilla, left subpectoral musculature, and left supraclavicular region   (6)  started on anastrozole daily beginning 03/01/2013, interrupted during radiation therapy, resumed March 2015, discontinued March 2016 because of fatigue and arthralgias   (7) radiation therapy 03/31/2013 through 05/23/2013 Site/dose:   The patient was treated initially with a forward treatment planning technique to the left chest wall in addition to treatment to the supraclavicular region. This consisted of a 3-D conformal technique. The patient was treated in this fashion to a dose of 50.4 gray. The patient then received a 14 gray boost treatment using an electron field. The total dose was 64.4 gray.   (8) Osteopenia, zometa yearly started 07/27/2014, but poorly tolerated.              (a) bone density scan on 12/27/13 showed a t-score of -1.8              (b)  started Denosumab/Prolia April 2017, canceled after one dose per patient   (9) tamoxifen started 07/27/2014, stopped November 2019 (had total 5 years of antiestrogens)   (10) likely thalassemia trait   (11) left subpectoral soft tissue nodule noted 07/17/2015 unchanged through serial scans, most recent 29/51/8841, with no metabolic activity noted in PET scan of 2016 and interval calcification.  This is presumed benign   (12)  ADDITIONAL REGIONAL RECURRENCE:             (A) CT of the chest with contrast 03/05/2021 obtained to evaluate left upper extremity lymphedema as showed acute pulmonary emboli.  There was also left axillary and subpectoral lymphadenopathy             (B) rivaroxaban started 03/05/2021             (C) left breast ultrasound 03/29/2021 shows 2 areas in the infraclavicular tissue consistent with fat necrosis.  There were however multiple retropectoral interpectoral and left axillary masses.             (  D) left axillary lymph node biopsy 04/04/2021   She had biopsy of the left axillary lymph node mass which showed invasive ductal carcinoma, grade 3, ER positive, 100%, strong staining intensity, PR positive, 20%, weak staining intensity, Ki-67 of 15%, HER2 positive by IHC, ratio 2.90 She was initially scheduled for fulvestrant however given her to positive breast Schmidt, we have discussed about additional therapy today.  Most recent CT showed stable disease   PLAN:   She is on herceptin and faslodex every 28 days, more for convenience since Herceptin is every 21 days and Faslodex every 28 days, she had to deal with multiple appointments Last imaging showed stable disease.   We will continue this current combination until progression or unacceptable toxicity, ECHO every 3 months She should consider anticoagulation indefinitely Repeat imaging ordered for December She once again asks the same question multiple times about the condition of her Schmidt, prognosis, duration of  treatment etc We have discussed today as well as multiple times in the past that this is stage IV metastatic breast Schmidt and she will need treatment as long as she can tolerate or if there is progression or until unacceptable toxicity.  Unfortunately she is not a good candidate for chemotherapy.  Hence we have been trying to use only Herceptin along with Faslodex to keep the disease stable.  I explained to her that it is good to have stable disease rather than progressive disease.  She is willing to continue the treatment.  She will return to clinic in about 4 weeks.   Total encounter time: 30 minutes in face-to-face visit time, chart review, lab review, care coordination, order entry, and documentation of the encounter.  *Total Encounter Time as defined by the Centers for Medicare and Medicaid Services includes, in addition to the face-to-face time of a patient visit (documented in the note above) non-face-to-face time: obtaining and reviewing outside history, ordering and reviewing medications, tests or procedures, care coordination (communications with other health care professionals or caregivers) and documentation in the medical record.

## 2022-03-05 NOTE — Patient Instructions (Signed)
[facility]  Discharge Instructions: Thank you for choosing [facility] to provide your oncology and hematology care.   If you have a lab appointment with the [facility], please go directly to the [facility] and check in at the registration area.   Wear comfortable clothing and clothing appropriate for easy access to any Portacath or PICC line.   We strive to give you quality time with your provider. You may need to reschedule your appointment if you arrive late (15 or more minutes).  Arriving late affects you and other patients whose appointments are after yours.  Also, if you miss three or more appointments without notifying the office, you may be dismissed from the clinic at the provider's discretion.      For prescription refill requests, have your pharmacy contact our office and allow 72 hours for refills to be completed.    Today you received the following chemotherapy and/or immunotherapy agents: Kanjinti and Faslodex   To help prevent nausea and vomiting after your treatment, we encourage you to take your nausea medication as directed.  BELOW ARE SYMPTOMS THAT SHOULD BE REPORTED IMMEDIATELY: *FEVER GREATER THAN 100.4 F (38 C) OR HIGHER *CHILLS OR SWEATING *NAUSEA AND VOMITING THAT IS NOT CONTROLLED WITH YOUR NAUSEA MEDICATION *UNUSUAL SHORTNESS OF BREATH *UNUSUAL BRUISING OR BLEEDING *URINARY PROBLEMS (pain or burning when urinating, or frequent urination) *BOWEL PROBLEMS (unusual diarrhea, constipation, pain near the anus) TENDERNESS IN MOUTH AND THROAT WITH OR WITHOUT PRESENCE OF ULCERS (sore throat, sores in mouth, or a toothache) UNUSUAL RASH, SWELLING OR PAIN  UNUSUAL VAGINAL DISCHARGE OR ITCHING   Items with * indicate a potential emergency and should be followed up as soon as possible or go to the Emergency Department if any problems should occur.  Please show the CHEMOTHERAPY ALERT CARD or IMMUNOTHERAPY ALERT CARD at  check-in to the Emergency Department and triage nurse.  Should you have questions after your visit or need to cancel or reschedule your appointment, please contact [facility]  Dept: [phone]  and follow the prompts.  Office hours are 8:00 a.m. to 4:30 p.m. Monday - Friday. Please note that voicemails left after 4:00 p.m. may not be returned until the following business day.  We are closed weekends and major holidays. You have access to a nurse at all times for urgent questions. Please call the main number to the clinic Dept: [phone] and follow the prompts.   For any non-urgent questions, you may also contact your provider using MyChart. We now offer e-Visits for anyone 44 and older to request care online for non-urgent symptoms. For details visit [email or web address].   Also download the MyChart app! Go to the app store, search "MyChart", open the app, select [facility], and log in with your MyChart username and password.  Masks are optional in the cancer centers. If you would like for your care team to wear a mask while they are taking care of you, please let them know. You may have one support person who is at least 86 years old accompany you for your appointments.

## 2022-03-06 ENCOUNTER — Other Ambulatory Visit: Payer: Self-pay

## 2022-03-06 LAB — CANCER ANTIGEN 27.29: CA 27.29: 32.5 U/mL (ref 0.0–38.6)

## 2022-03-13 ENCOUNTER — Other Ambulatory Visit: Payer: Self-pay

## 2022-03-17 ENCOUNTER — Ambulatory Visit (INDEPENDENT_AMBULATORY_CARE_PROVIDER_SITE_OTHER): Payer: Medicare Other | Admitting: Podiatry

## 2022-03-17 ENCOUNTER — Encounter: Payer: Self-pay | Admitting: Podiatry

## 2022-03-17 VITALS — BP 132/64

## 2022-03-17 DIAGNOSIS — R481 Agnosia: Secondary | ICD-10-CM | POA: Insufficient documentation

## 2022-03-17 DIAGNOSIS — I739 Peripheral vascular disease, unspecified: Secondary | ICD-10-CM

## 2022-03-17 DIAGNOSIS — M79675 Pain in left toe(s): Secondary | ICD-10-CM

## 2022-03-17 DIAGNOSIS — Q828 Other specified congenital malformations of skin: Secondary | ICD-10-CM | POA: Diagnosis not present

## 2022-03-17 DIAGNOSIS — M79674 Pain in right toe(s): Secondary | ICD-10-CM

## 2022-03-17 DIAGNOSIS — E1142 Type 2 diabetes mellitus with diabetic polyneuropathy: Secondary | ICD-10-CM

## 2022-03-17 DIAGNOSIS — B351 Tinea unguium: Secondary | ICD-10-CM

## 2022-03-17 DIAGNOSIS — M159 Polyosteoarthritis, unspecified: Secondary | ICD-10-CM | POA: Insufficient documentation

## 2022-03-17 DIAGNOSIS — L84 Corns and callosities: Secondary | ICD-10-CM

## 2022-03-17 NOTE — Progress Notes (Signed)
  Subjective:  Patient ID: Tami Schmidt, female    DOB: 1933/01/26,  MRN: [id number]  Tami Schmidt presents to clinic today for at risk footcare. Patient has h/o diabetes, neuropathy and PAD and is seen for  and painful porokeratotic lesion(s) b/l lower extremities and painful mycotic toenails that limit ambulation. Painful toenails interfere with ambulation. Aggravating factors include wearing enclosed shoe gear. Pain is relieved with periodic professional debridement. Painful porokeratotic lesions are aggravated when weightbearing with and without shoegear. Pain is relieved with periodic professional debridement.  Chief Complaint  Patient presents with   Nail Problem    DFC BS-113 A1C-6.1 PCP- Jetty Duhamel PCP VST- 6 Months ago   New problem(s): None.   PCP is [name], MD (Inactive).  Allergies  Allergen Reactions   Bactrim Swelling    SWELLING OF MOUTH/FACE.   Lisinopril Swelling    SWELLING OF MOUTH/FACE.   Vasotec Swelling    SWELLING OF MOUTH/FACE.   Sulfamethoxazole-Trimethoprim     Other reaction(s): Unknown    Review of Systems: Negative except as noted in the HPI.  Objective: No changes noted in today's physical examination. Vitals:   03/17/22 1337  BP: 132/64    Tami Schmidt is a pleasant 88 y.o. female WD, WN in NAD. AAO x 3.  Vascular Examination: CFT <3 seconds b/l. DP pulses faintly palpable b/l. PT pulses nonpalpable b/l. Digital hair absent. Skin temperature gradient warm to warm b/l. No pain with calf compression. No ischemia or gangrene. No cyanosis or clubbing noted b/l.    Neurological Examination: Sensation grossly intact b/l with 10 gram monofilament. Vibratory sensation diminished b/l.  Dermatological Examination: Pedal skin warm and supple b/l. Toenails 1-5 b/l thick, discolored, elongated with subungual debris and pain on dorsal palpation.    Hyperkeratotic lesion(s) dorsal PIPJ of bilateral 5th toes, submet head 5 right  foot, and sub 5th met base left lower extremity.  No erythema, no edema, no drainage, no fluctuance.  Musculoskeletal Examination: Muscle strength 5/5 to b/l LE. HAV with bunion bilaterally and hammertoes 2-5 b/l. Pes planus deformity noted bilateral LE.  Radiographs: None  Assessment/Plan: 1. Pain due to onychomycosis of toenails of both feet   2. Corns and callosities   3. Porokeratosis   4. PVD (peripheral vascular disease) ([place])   5. Diabetic peripheral neuropathy associated with type 2 diabetes mellitus (HCC)     No orders of the defined types were placed in this encounter.  -Consent given for treatment as described below: -Continue supportive shoe gear daily. -Toenails 1-5 b/l were debrided in length and girth with sterile nail nippers and dremel without iatrogenic bleeding.  -Porokeratotic lesion(s) submet head 5 b/l pared and enucleated with sterile currette without incident. Total number of lesions debrided=2. -Patient/POA to call should there be question/concern in the interim.   Return in about 3 months (around 06/16/2022).  Marzetta Board, DPM

## 2022-03-18 ENCOUNTER — Other Ambulatory Visit: Payer: Self-pay

## 2022-03-25 ENCOUNTER — Ambulatory Visit (HOSPITAL_COMMUNITY)
Admission: RE | Admit: 2022-03-25 | Discharge: 2022-03-25 | Disposition: A | Discharge status: Home / Self Care | Payer: Medicare Other | Source: Ambulatory Visit | Attending: Hematology and Oncology | Admitting: Hematology and Oncology

## 2022-03-25 DIAGNOSIS — C50312 Malignant neoplasm of lower-inner quadrant of left female breast: Secondary | ICD-10-CM | POA: Insufficient documentation

## 2022-03-25 DIAGNOSIS — Z170 Estrogen receptor positive status [ER+]: Secondary | ICD-10-CM | POA: Diagnosis present

## 2022-03-25 MED ORDER — IOHEXOL 9 MG/ML PO SOLN
1000.0000 mL | Freq: Once | ORAL | Status: AC
  Administered 2022-03-25: 1000 mL via ORAL

## 2022-03-25 MED ORDER — HEPARIN SOD (PORK) LOCK FLUSH 100 UNIT/ML IV SOLN: 500.0000 [IU] | Freq: Once | INTRAVENOUS | Status: AC

## 2022-03-25 MED ORDER — IOHEXOL 300 MG/ML  SOLN
100.0000 mL | Freq: Once | INTRAMUSCULAR | Status: AC | PRN
  Administered 2022-03-25: 100 mL via INTRAVENOUS

## 2022-03-25 MED ORDER — HEPARIN SOD (PORK) LOCK FLUSH 100 UNIT/ML IV SOLN
INTRAVENOUS | Status: AC
  Administered 2022-03-25: 500 [IU] via INTRAVENOUS
  Filled 2022-03-25: qty 5

## 2022-04-02 NOTE — Progress Notes (Signed)
[facility]    Patient Care Team: Margot Ables, MD (Inactive) as PCP - General (Family Medicine) Otho Darner, MD as Consulting Physician (Family Medicine) Donnelly Angelica, RN as Oncology Nurse Navigator Pershing Proud, RN as Oncology Nurse Navigator Rachel Moulds, MD as Consulting Physician (Hematology and Oncology)    Name of the patient: Tami Schmidt  [id number]  10/05/1932   Date of visit: 04/03/2022   Chief Complaint/Reason for visit: toxicity check   Current Therapy: Herceptin and Faslodex q28 days  Last treatment:  Day 1   Cycle 12 on 03/05/22   ASSESSMENT & PLAN: Patient is a 88 y.o. 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 Al Pimple.  I have viewed most recent oncology note and lab work.    #)Estrogen positive progesterone positive and HER2 positive breast cancer recurrence diagnosed in November 2022. - Labs including CBC and CMP are acceptable for treatment today. - Had restaging CT 03/25/22 that shows  mild disease progression with enlarging left axillary and subpectoral lymphadenopathy. Likely has benign mildly enlarged subcarinal lymph node as well that can be followed on upcoming studies. Discussed results with patient in detail.  -Scheduled to see Dr. Al Pimple next on 05/29/22 -Discussed scan results with Dr. Al Pimple who is recommending treatment change to Kadcyla. I discussed the risks of Kadcyla including liver damage, heart failure, leukopenia, thrombocytopenia. Patient aware she will still need echo every 3 months. Patient wants to continue treating her cancer and is agreeable to treatment change. She is aware she will be on this treatment until she has progression or develops toxicities. Dr. Al Pimple informed of patient's decision.  #) Symptom management: Fatigue -Patient has ongoing fatigue from her current treatment. It is manageable. -She  continues to have itching skin on her chest that is managed by OTC anti itch cream, she does not remember the name.  -No pain reported. She she continues to take Eliquis as prescribed and I reminded her this will be lifelong anticoagulation based on high risk factors and history.      Heme/Onc History: Oncology History  Malignant neoplasm of lower-inner quadrant of left breast in female, estrogen receptor positive (HCC)  06/16/2013 Initial Diagnosis   Malignant neoplasm of lower-inner quadrant of left breast in female, estrogen receptor positive (HCC)   05/30/2021 -  Chemotherapy   Patient is on Treatment Plan : BREAST Trastuzumab q28d      Genetic Testing   [facility] CancerNext-Expanded Panel was Negative. 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 Report date is 07/22/2021.  The CancerNext-Expanded gene panel offered by [facility] and includes sequencing, rearrangement, and RNA analysis for the following 77 genes: AIP, ALK, APC, ATM, AXIN2, BAP1, BARD1, BLM, BMPR1A, BRCA1, BRCA2, BRIP1, CDC73, CDH1, CDK4, CDKN1B, CDKN2A, CHEK2, CTNNA1, DICER1, FANCC, FH, FLCN, GALNT12, KIF1B, LZTR1, MAX, MEN1, MET, MLH1, MSH2, MSH3, MSH6, MUTYH, NBN, NF1, NF2, NTHL1, PALB2, PHOX2B, PMS2, POT1, PRKAR1A, PTCH1, PTEN, RAD51C, RAD51D, RB1, RECQL, RET, SDHA, SDHAF2, SDHB, SDHC, SDHD, SMAD4, SMARCA4, SMARCB1, SMARCE1, STK11, SUFU, TMEM127, TP53, TSC1, TSC2, VHL and XRCC2 (sequencing and deletion/duplication); EGFR, EGLN1, HOXB13, KIT, MITF, PDGFRA, POLD1, and POLE (sequencing only); EPCAM and GREM1 (deletion/duplication only).        Interval history-: [name] is a 88 y.o. female with oncologic history as above seen today for toxicity check prior to treatment day 1 cycle 13. Patient presents  unaccompanied to clinic today.  Patient states since last treatment she has overall been doing well. She does not complain of nay new side effects.   She continues to have  fatigue although states it is manageable.  She will stop to rest when she feels tired and will quickly regain her strength and energy she reports.  She continues to have intermittent swelling in her left arm, she states it is unchanged today from previous visits.  She is not struggling with pain, does not need to take any over-the-counter medications for this.  She is looking forward to the holidays and seeing her children who are coming to visit.  She denies any neurosymptoms, chest pain, shortness of breath or any falls.       ROS  All other systems are reviewed and are negative for acute change except as noted in the HPI.    Allergies  Allergen Reactions   Bactrim Swelling    SWELLING OF MOUTH/FACE.   Lisinopril Swelling    SWELLING OF MOUTH/FACE.   Vasotec Swelling    SWELLING OF MOUTH/FACE.   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   Diabetes mellitus    fasting 90-100   HTN (hypertension) 03/25/2013   Hx of radiation therapy    breasts hx   Hypercholesterolemia    Hypertension    Lymphedema of arm    left arm   Pulmonary embolism (HCC)    Type II or unspecified type diabetes mellitus without mention of complication, not stated as uncontrolled 03/25/2013     Past Surgical History:  Procedure Laterality Date   ABDOMINAL HYSTERECTOMY     ANKLE SURGERY Right 1995   APPENDECTOMY     BIOPSY  01/07/2018   Procedure: BIOPSY;  Surgeon: Kerin Salen, MD;  Location: [facility];  Service: Gastroenterology;;   BREAST SURGERY Bilateral    mastectomy   COLONOSCOPY WITH PROPOFOL N/A 01/07/2018   Procedure: COLONOSCOPY WITH PROPOFOL;  Surgeon: Kerin Salen, MD;  Location: [facility];  Service: Gastroenterology;  Laterality: N/A;   ESOPHAGOGASTRODUODENOSCOPY (EGD) WITH  PROPOFOL N/A 01/06/2018   Procedure: ESOPHAGOGASTRODUODENOSCOPY (EGD) WITH PROPOFOL;  Surgeon: Kerin Salen, MD;  Location: [facility];  Service: Gastroenterology;  Laterality: N/A;   HERNIA REPAIR  04-26-2010   IR IMAGING GUIDED PORT INSERTION  10/29/2021   MASS EXCISION Left 01/26/2013   Procedure: EXCISION LEFT CHEST WALL MASS AND LEFT ABDOMNAL WALL MASS;  Surgeon: [name], MD;  Location: [facility];  Service: General;  Laterality: Left;   MASS EXCISION Left 09/20/2014   Procedure: EXCISION OF LEFT CHEST WALL MASS;  Surgeon: [name], MD;  Location: [facility];  Service: General;  Laterality: Left;   TEE WITHOUT CARDIOVERSION N/A 12/08/2017   Procedure: TRANSESOPHAGEAL ECHOCARDIOGRAM (TEE);  Surgeon: Lewayne Bunting, MD;  Location: [facility];  Service: Cardiovascular;  Laterality: N/A;   TOTAL HIP ARTHROPLASTY Right 01/05/2017   TOTAL HIP ARTHROPLASTY Right 01/05/2017   Procedure: TOTAL HIP ARTHROPLASTY ANTERIOR APPROACH;  Surgeon: Gean Birchwood, MD;  Location: [facility] OR;  Service: Orthopedics;  Laterality: Right;    Social History   Socioeconomic History   Marital status: Married    Spouse name: Not on file   Number of children: Not on  file   Years of education: Not on file   Highest education level: Not on file  Occupational History   Occupation: retired Child psychotherapist  Tobacco Use   Smoking status: Former    Types: Cigarettes    Quit date: 04/14/1972    Years since quitting: 50.0   Smokeless tobacco: Never  Vaping Use   Vaping Use: Never used  Substance and Sexual Activity   Alcohol use: Yes    Comment: occasional   Drug use: Not Currently   Sexual activity: Yes    Birth control/protection: Surgical  Other Topics Concern   Not on file  Social History Narrative   Not on file   Social Determinants of Health   Financial Resource Strain: Not on file  Food Insecurity: Not on file  Transportation Needs: Not on file  Physical Activity: Not on file   Stress: Not on file  Social Connections: Not on file  Intimate Partner Violence: Not on file    Family History  Problem Relation Age of Onset   Breast cancer Cousin        maternal first cousin   Uterine cancer Cousin        maternal first cousin     Current Outpatient Medications:    amLODipine (NORVASC) 10 MG tablet, Take 10 mg by mouth daily., Disp: , Rfl:    apixaban (ELIQUIS) 5 MG TABS tablet, Take 1 tablet (5 mg total) by mouth 2 (two) times daily., Disp: 180 tablet, Rfl: 3   metFORMIN (GLUCOPHAGE) 500 MG tablet, Take 500 mg by mouth daily with breakfast., Disp: , Rfl:    metoprolol succinate (TOPROL-XL) 25 MG 24 hr tablet, Take 25 mg by mouth daily., Disp: , Rfl:    olmesartan-hydrochlorothiazide (BENICAR HCT) 40-12.5 MG tablet, Take 1 tablet by mouth daily., Disp: , Rfl:    SM IRON 325 (65 Fe) MG tablet, Take 325 mg by mouth daily., Disp: , Rfl:    Turmeric (QC TUMERIC COMPLEX PO), Take 1 tablet by mouth daily., Disp: , Rfl:    TYLENOL 325 MG tablet, Take 325 mg by mouth daily., Disp: , Rfl:    VITAMIN D PO, Take 1 tablet by mouth daily., Disp: , Rfl:  No current facility-administered medications for this visit.  Facility-Administered Medications Ordered in Other Visits:    fulvestrant (FASLODEX) injection 500 mg, 500 mg, Intramuscular, Q28 days, Iruku, Praveena, MD, 500 mg at 04/03/22 1502   heparin lock flush 100 unit/mL, 500 Units, Intracatheter, Once PRN, Iruku, Praveena, MD   sodium chloride flush (NS) 0.9 % injection 10 mL, 10 mL, Intracatheter, PRN, Iruku, Praveena, MD   trastuzumab-anns (KANJINTI) 588 mg in sodium chloride 0.9 % 250 mL chemo infusion, 6 mg/kg (Treatment Plan Recorded), Intravenous, Once, Iruku, Praveena, MD, Last Rate: 556 mL/hr at 04/03/22 1525, 588 mg at 04/03/22 1525  PHYSICAL EXAM: ECOG FS:1 - Symptomatic but completely ambulatory    Vitals:   04/03/22 1316 04/03/22 1322  BP:  137/70  Pulse:  79  Resp:  18  Temp:  98.2 F (36.8 C)   TempSrc:  Oral  SpO2:  99%  Weight: 195 lb 11.2 oz (88.8 kg)    Physical Exam Vitals and nursing note reviewed.  Constitutional:      Appearance: She is well-developed. She is not ill-appearing or toxic-appearing.  HENT:     Head: Normocephalic.     Nose: Nose normal.  Eyes:     Conjunctiva/sclera: Conjunctivae normal.  Neck:  Vascular: No JVD.  Cardiovascular:     Rate and Rhythm: Normal rate and regular rhythm.     Pulses: Normal pulses.          Radial pulses are 2+ on the right side and 2+ on the left side.     Heart sounds: Normal heart sounds.  Pulmonary:     Effort: Pulmonary effort is normal.     Breath sounds: Normal breath sounds.  Abdominal:     General: There is no distension.  Musculoskeletal:     Cervical back: Normal range of motion.     Comments: Wearing compression sleeve on left upper extremity.  Swelling throughout left upper extremity with soft compartments.  Full range of motion of left upper extremity.  Skin:    General: Skin is warm and dry.     Capillary Refill: Capillary refill takes less than 2 seconds.     Comments: Equal tactile temperature in upper extremities  Neurological:     Mental Status: She is oriented to person, place, and time.        LABORATORY DATA: I have reviewed the data as listed    Latest Ref Rng & Units 04/03/2022   12:50 PM 03/05/2022    1:32 PM 02/06/2022    1:22 PM  CBC  WBC 4.0 - 10.5 K/uL 5.6  6.1  6.1   Hemoglobin 12.0 - 15.0 g/dL 95.6  21.3  08.6   Hematocrit 36.0 - 46.0 % 36.2  36.9  37.6   Platelets 150 - 400 K/uL 241  230  229         Latest Ref Rng & Units 04/03/2022   12:50 PM 03/05/2022    1:32 PM 02/06/2022    1:22 PM  CMP  Glucose 70 - 99 mg/dL 578  469  629   BUN 8 - 23 mg/dL 20  22  23    Creatinine 0.44 - 1.00 mg/dL 5.28  4.13  2.44   Sodium 135 - 145 mmol/L 139  137  140   Potassium 3.5 - 5.1 mmol/L 3.7  3.9  3.8   Chloride 98 - 111 mmol/L 104  104  105   CO2 22 - 32 mmol/L 28  26   28    Calcium 8.9 - 10.3 mg/dL 9.3  9.1  9.1   Total Protein 6.5 - 8.1 g/dL 6.9  7.0  6.8   Total Bilirubin 0.3 - 1.2 mg/dL 0.5  0.5  0.4   Alkaline Phos 38 - 126 U/L 56  51  57   AST 15 - 41 U/L 13  15  10    ALT 0 - 44 U/L 8  10  8         RADIOGRAPHIC STUDIES (from last 24 hours if applicable) I have personally reviewed the radiological images as listed and agreed with the findings in the report. No results found.      Visit Diagnosis: 1. Malignant neoplasm of lower-inner quadrant of left breast in female, estrogen receptor positive (HCC)   2. Lymphedema of upper extremity      No orders of the defined types were placed in this encounter.   All questions were answered. The patient knows to call the clinic with any problems, questions or concerns. No barriers to learning was detected.  I have spent a total of 30 minutes minutes of face-to-face and non-face-to-face time, preparing to see the patient, obtaining and/or reviewing separately obtained history, performing a medically appropriate examination, counseling and educating  the patient, ordering tests, documenting clinical information in the electronic health record, and care coordination (communications with other health care professionals or caregivers).    Thank you for allowing me to participate in the care of this patient.    Shanon Ace, PA-C Department of Hematology/Oncology [facility] at [facility] Phone: [phone]  [phone]    04/03/2022 3:43 PM

## 2022-04-03 ENCOUNTER — Inpatient Hospital Stay: Payer: Medicare Other

## 2022-04-03 ENCOUNTER — Other Ambulatory Visit: Payer: Self-pay

## 2022-04-03 ENCOUNTER — Inpatient Hospital Stay (HOSPITAL_BASED_OUTPATIENT_CLINIC_OR_DEPARTMENT_OTHER): Payer: Medicare Other | Admitting: Physician Assistant

## 2022-04-03 ENCOUNTER — Inpatient Hospital Stay: Payer: Medicare Other | Attending: Adult Health

## 2022-04-03 VITALS — BP 137/70 | HR 79 | Temp 98.2°F | Resp 18 | Wt 195.7 lb

## 2022-04-03 DIAGNOSIS — C50312 Malignant neoplasm of lower-inner quadrant of left female breast: Secondary | ICD-10-CM

## 2022-04-03 DIAGNOSIS — Z170 Estrogen receptor positive status [ER+]: Secondary | ICD-10-CM

## 2022-04-03 DIAGNOSIS — C50919 Malignant neoplasm of unspecified site of unspecified female breast: Secondary | ICD-10-CM

## 2022-04-03 DIAGNOSIS — I890 Lymphedema, not elsewhere classified: Secondary | ICD-10-CM | POA: Diagnosis not present

## 2022-04-03 DIAGNOSIS — Z5111 Encounter for antineoplastic chemotherapy: Secondary | ICD-10-CM | POA: Insufficient documentation

## 2022-04-03 DIAGNOSIS — Z86711 Personal history of pulmonary embolism: Secondary | ICD-10-CM | POA: Diagnosis not present

## 2022-04-03 DIAGNOSIS — Z7901 Long term (current) use of anticoagulants: Secondary | ICD-10-CM | POA: Insufficient documentation

## 2022-04-03 DIAGNOSIS — Z5112 Encounter for antineoplastic immunotherapy: Secondary | ICD-10-CM | POA: Insufficient documentation

## 2022-04-03 DIAGNOSIS — R5383 Other fatigue: Secondary | ICD-10-CM | POA: Insufficient documentation

## 2022-04-03 LAB — CMP (CANCER CENTER ONLY)
ALT: 8 U/L (ref 0–44)
AST: 13 U/L — ABNORMAL LOW (ref 15–41)
Albumin: 4 g/dL (ref 3.5–5.0)
Alkaline Phosphatase: 56 U/L (ref 38–126)
Anion gap: 7 (ref 5–15)
BUN: 20 mg/dL (ref 8–23)
CO2: 28 mmol/L (ref 22–32)
Calcium: 9.3 mg/dL (ref 8.9–10.3)
Chloride: 104 mmol/L (ref 98–111)
Creatinine: 0.79 mg/dL (ref 0.44–1.00)
GFR, Estimated: >60 mL/min (ref >60)
Glucose, Bld: 123 mg/dL — ABNORMAL HIGH (ref 70–99)
Potassium: 3.7 mmol/L (ref 3.5–5.1)
Sodium: 139 mmol/L (ref 135–145)
Total Bilirubin: 0.5 mg/dL (ref 0.3–1.2)
Total Protein: 6.9 g/dL (ref 6.5–8.1)

## 2022-04-03 LAB — CBC WITH DIFFERENTIAL (CANCER CENTER ONLY)
Abs Immature Granulocytes: 0.01 10*3/uL (ref 0.00–0.07)
Basophils Absolute: 0 10*3/uL (ref 0.0–0.1)
Basophils Relative: 1 %
Eosinophils Absolute: 0.2 10*3/uL (ref 0.0–0.5)
Eosinophils Relative: 3 %
HCT: 36.2 % (ref 36.0–46.0)
Hemoglobin: 12 g/dL (ref 12.0–15.0)
Immature Granulocytes: 0 %
Lymphocytes Relative: 27 %
Lymphs Abs: 1.5 10*3/uL (ref 0.7–4.0)
MCH: 26 pg (ref 26.0–34.0)
MCHC: 33.1 g/dL (ref 30.0–36.0)
MCV: 78.5 fL — ABNORMAL LOW (ref 80.0–100.0)
Monocytes Absolute: 0.4 10*3/uL (ref 0.1–1.0)
Monocytes Relative: 6 %
Neutro Abs: 3.5 10*3/uL (ref 1.7–7.7)
Neutrophils Relative %: 63 %
Platelet Count: 241 10*3/uL (ref 150–400)
RBC: 4.61 MIL/uL (ref 3.87–5.11)
RDW: 15.1 % (ref 11.5–15.5)
WBC Count: 5.6 10*3/uL (ref 4.0–10.5)
nRBC: 0 % (ref 0.0–0.2)

## 2022-04-03 MED ORDER — SODIUM CHLORIDE 0.9% FLUSH
10.0000 mL | INTRAVENOUS | Status: DC | PRN
  Administered 2022-04-03: 10 mL

## 2022-04-03 MED ORDER — ACETAMINOPHEN 325 MG PO TABS
650.0000 mg | ORAL_TABLET | Freq: Once | ORAL | Status: AC
  Administered 2022-04-03: 650 mg via ORAL
  Filled 2022-04-03: qty 2

## 2022-04-03 MED ORDER — DIPHENHYDRAMINE HCL 25 MG PO CAPS
50.0000 mg | ORAL_CAPSULE | Freq: Once | ORAL | Status: AC
  Administered 2022-04-03: 50 mg via ORAL
  Filled 2022-04-03: qty 2

## 2022-04-03 MED ORDER — TRASTUZUMAB-ANNS CHEMO 150 MG IV SOLR
6.0000 mg/kg | Freq: Once | INTRAVENOUS | Status: AC
  Administered 2022-04-03: 588 mg via INTRAVENOUS
  Filled 2022-04-03: qty 28

## 2022-04-03 MED ORDER — SODIUM CHLORIDE 0.9% FLUSH
10.0000 mL | Freq: Once | INTRAVENOUS | Status: AC
  Administered 2022-04-03: 10 mL

## 2022-04-03 MED ORDER — FULVESTRANT 250 MG/5ML IM SOSY
500.0000 mg | PREFILLED_SYRINGE | INTRAMUSCULAR | Status: DC
  Administered 2022-04-03: 500 mg via INTRAMUSCULAR
  Filled 2022-04-03: qty 10

## 2022-04-03 MED ORDER — HEPARIN SOD (PORK) LOCK FLUSH 100 UNIT/ML IV SOLN
500.0000 [IU] | Freq: Once | INTRAVENOUS | Status: AC | PRN
  Administered 2022-04-03: 500 [IU]

## 2022-04-03 MED ORDER — SODIUM CHLORIDE 0.9 % IV SOLN: Freq: Once | INTRAVENOUS | Status: AC

## 2022-04-03 NOTE — Patient Instructions (Signed)
[facility] MEDICAL ONCOLOGY  Discharge Instructions: Thank you for choosing [facility] to provide your oncology and hematology care.   If you have a lab appointment with the Cancer Center, please go directly to the Cancer Center and check in at the registration area.   Wear comfortable clothing and clothing appropriate for easy access to any Portacath or PICC line.   We strive to give you quality time with your provider. You may need to reschedule your appointment if you arrive late (15 or more minutes).  Arriving late affects you and other patients whose appointments are after yours.  Also, if you miss three or more appointments without notifying the office, you may be dismissed from the clinic at the provider's discretion.      For prescription refill requests, have your pharmacy contact our office and allow 72 hours for refills to be completed.    Today you received the following chemotherapy and/or immunotherapy agents herceptin      To help prevent nausea and vomiting after your treatment, we encourage you to take your nausea medication as directed.  BELOW ARE SYMPTOMS THAT SHOULD BE REPORTED IMMEDIATELY: *FEVER GREATER THAN 100.4 F (38 C) OR HIGHER *CHILLS OR SWEATING *NAUSEA AND VOMITING THAT IS NOT CONTROLLED WITH YOUR NAUSEA MEDICATION *UNUSUAL SHORTNESS OF BREATH *UNUSUAL BRUISING OR BLEEDING *URINARY PROBLEMS (pain or burning when urinating, or frequent urination) *BOWEL PROBLEMS (unusual diarrhea, constipation, pain near the anus) TENDERNESS IN MOUTH AND THROAT WITH OR WITHOUT PRESENCE OF ULCERS (sore throat, sores in mouth, or a toothache) UNUSUAL RASH, SWELLING OR PAIN  UNUSUAL VAGINAL DISCHARGE OR ITCHING   Items with * indicate a potential emergency and should be followed up as soon as possible or go to the Emergency Department if any problems should occur.  Please show the CHEMOTHERAPY ALERT CARD or IMMUNOTHERAPY ALERT CARD at check-in to  the Emergency Department and triage nurse.  Should you have questions after your visit or need to cancel or reschedule your appointment, please contact  CANCER CENTER MEDICAL ONCOLOGY  Dept: [phone]  and follow the prompts.  Office hours are 8:00 a.m. to 4:30 p.m. Monday - Friday. Please note that voicemails left after 4:00 p.m. may not be returned until the following business day.  We are closed weekends and major holidays. You have access to a nurse at all times for urgent questions. Please call the main number to the clinic Dept: [phone] and follow the prompts.   For any non-urgent questions, you may also contact your provider using MyChart. We now offer e-Visits for anyone 18 and older to request care online for non-urgent symptoms. For details visit [email or web address].   Also download the MyChart app! Go to the app store, search "MyChart", open the app, select , and log in with your MyChart username and password.  Masks are optional in the cancer centers. If you would like for your care team to wear a mask while they are taking care of you, please let them know. You may have one support person who is at least 86 years old accompany you for your appointments. 

## 2022-04-04 LAB — CANCER ANTIGEN 27.29: CA 27.29: 36.2 U/mL (ref 0.0–38.6)

## 2022-04-15 ENCOUNTER — Other Ambulatory Visit: Payer: Self-pay | Admitting: Pharmacist

## 2022-04-21 ENCOUNTER — Telehealth: Payer: Self-pay | Admitting: Hematology and Oncology

## 2022-04-21 NOTE — Telephone Encounter (Signed)
Contacted patient to scheduled appointments. Left message with appointment details and a call back number if patient had any questions or could not accommodate the time we provided.   

## 2022-04-24 ENCOUNTER — Other Ambulatory Visit: Payer: Self-pay

## 2022-04-24 ENCOUNTER — Encounter: Payer: Self-pay | Admitting: Hematology and Oncology

## 2022-04-24 ENCOUNTER — Other Ambulatory Visit: Payer: Self-pay | Admitting: *Deleted

## 2022-04-24 ENCOUNTER — Telehealth: Payer: Self-pay | Admitting: *Deleted

## 2022-04-24 ENCOUNTER — Inpatient Hospital Stay: Payer: Medicare Other | Attending: Adult Health | Admitting: Hematology and Oncology

## 2022-04-24 VITALS — BP 163/72 | HR 60 | Temp 97.7°F | Resp 16 | Ht 67.0 in | Wt 194.1 lb

## 2022-04-24 DIAGNOSIS — Z170 Estrogen receptor positive status [ER+]: Secondary | ICD-10-CM | POA: Diagnosis not present

## 2022-04-24 DIAGNOSIS — R5383 Other fatigue: Secondary | ICD-10-CM | POA: Insufficient documentation

## 2022-04-24 DIAGNOSIS — Z7901 Long term (current) use of anticoagulants: Secondary | ICD-10-CM | POA: Insufficient documentation

## 2022-04-24 DIAGNOSIS — C50312 Malignant neoplasm of lower-inner quadrant of left female breast: Secondary | ICD-10-CM | POA: Insufficient documentation

## 2022-04-24 DIAGNOSIS — Z5111 Encounter for antineoplastic chemotherapy: Secondary | ICD-10-CM | POA: Diagnosis present

## 2022-04-24 DIAGNOSIS — Z5112 Encounter for antineoplastic immunotherapy: Secondary | ICD-10-CM | POA: Diagnosis present

## 2022-04-24 DIAGNOSIS — Z79899 Other long term (current) drug therapy: Secondary | ICD-10-CM | POA: Insufficient documentation

## 2022-04-24 DIAGNOSIS — Z86711 Personal history of pulmonary embolism: Secondary | ICD-10-CM | POA: Insufficient documentation

## 2022-04-24 MED ORDER — LIDOCAINE-PRILOCAINE 2.5-2.5 % EX CREA: TOPICAL_CREAM | CUTANEOUS | 3 refills | Status: DC

## 2022-04-24 MED ORDER — ONDANSETRON HCL 8 MG PO TABS: 8.0000 mg | ORAL_TABLET | Freq: Three times a day (TID) | ORAL | 1 refills | Status: DC | PRN

## 2022-04-24 MED ORDER — PROCHLORPERAZINE MALEATE 10 MG PO TABS: 10.0000 mg | ORAL_TABLET | Freq: Four times a day (QID) | ORAL | 1 refills | Status: DC | PRN

## 2022-04-24 NOTE — Progress Notes (Signed)
[facility]    Patient Care Team: [name], MD (Inactive) as PCP - General (Family Medicine) Gentry Fitz, MD as Consulting Physician (Family Medicine) Rockwell Germany, RN as Oncology Nurse Navigator Mauro Kaufmann, RN as Oncology Nurse Navigator Benay Pike, MD as Consulting Physician (Hematology and Oncology)    Name of the patient: Tami Schmidt  [id number]  [birth year]   Date of visit: 04/24/2022   Chief Complaint/Reason for visit: toxicity check   Current Therapy: Kadcyla  Last treatment:  Day 1   Cycle 12 on 03/05/22   ASSESSMENT & PLAN: Patient is a 89 y.o. 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 Chryl Heck.  I have viewed most recent oncology note and lab work.    #)Estrogen positive progesterone positive and HER2 positive breast cancer recurrence diagnosed in November 2022. - Labs including CBC and CMP are acceptable for treatment today. - Had restaging CT 03/25/22 that shows  mild disease progression with enlarging left axillary and subpectoral lymphadenopathy. Likely has benign mildly enlarged subcarinal lymph node as well that can be followed on upcoming studies.  We have previously discussed and today as well regarding the adverse effects of Kadcyla including but not limited to fatigue, nausea, vomiting, diarrhea, increased risk of infections, cardiotoxicity, neuropathy, cytopenias etc. I have also ordered an ECHO, she is due for 1.  Will plan to repeat imaging after 2-3 cycles of Kadcyla.  #) Symptom management: Fatigue -Patient has ongoing fatigue from her current treatment. It is manageable.  -No pain reported. She she continues to take Eliquis as prescribed and I reminded her this will be lifelong anticoagulation based on high risk factors and history.  Heme/Onc History: Oncology History  Malignant neoplasm of lower-inner  quadrant of left breast in female, estrogen receptor positive ([place])  06/16/2013 Initial Diagnosis   Malignant neoplasm of lower-inner quadrant of left breast in female, estrogen receptor positive ([place])   05/30/2021 - 04/03/2022 Chemotherapy   Patient is on Treatment Plan : BREAST Trastuzumab q28d      Genetic Testing   [facility] CancerNext-Expanded Panel was Negative. 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 Report date is 07/22/2021.  The CancerNext-Expanded gene panel offered by [facility] and includes sequencing, rearrangement, and RNA analysis for the following 77 genes: AIP, ALK, APC, ATM, AXIN2, BAP1, BARD1, BLM, BMPR1A, BRCA1, BRCA2, BRIP1, CDC73, CDH1, CDK4, CDKN1B, CDKN2A, CHEK2, CTNNA1, DICER1, FANCC, FH, FLCN, GALNT12, KIF1B, LZTR1, MAX, MEN1, MET, MLH1, MSH2, MSH3, MSH6, MUTYH, NBN, NF1, NF2, NTHL1, PALB2, PHOX2B, PMS2, POT1, PRKAR1A, PTCH1, PTEN, RAD51C, RAD51D, RB1, RECQL, RET, SDHA, SDHAF2, SDHB, SDHC, SDHD, SMAD4, SMARCA4, SMARCB1, SMARCE1, STK11, SUFU, TMEM127, TP53, TSC1, TSC2, VHL and XRCC2 (sequencing and deletion/duplication); EGFR, EGLN1, HOXB13, KIT, MITF, PDGFRA, POLD1, and POLE (sequencing only); EPCAM and GREM1 (deletion/duplication only).    05/01/2022 -  Chemotherapy   Patient is on Treatment Plan : BREAST ADO-Trastuzumab Emtansine (Kadcyla) q21d      Interval history-: Tami Schmidt is a 89 y.o. female with oncologic history as above seen today for toxicity check prior to treatment day 1 cycle 13. Patient presents unaccompanied to clinic today.  Patient states since last treatment she has overall been doing well. She does not complain of nay new side effects.   She continues to have fatigue although states it is manageable.  She will stop to rest when she  feels tired and will quickly regain her strength and energy she reports.   She denies any neurosymptoms, chest pain, shortness of breath or any falls. Bowels are moving  regularly. No trouble urinating except weak stream No falls. Rest of the pertinent 10 point ROS reviewed and negative  Allergies  Allergen Reactions   Bactrim Swelling    SWELLING OF MOUTH/FACE.   Lisinopril Swelling    SWELLING OF MOUTH/FACE.   Vasotec Swelling    SWELLING OF MOUTH/FACE.   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   Diabetes mellitus    fasting 90-100   HTN (hypertension) 03/25/2013   Hx of radiation therapy    breasts hx   Hypercholesterolemia    Hypertension    Lymphedema of arm    left arm   Pulmonary embolism ([place])    Type II or unspecified type diabetes mellitus without mention of complication, not stated as uncontrolled 03/25/2013     Past Surgical History:  Procedure Laterality Date   ABDOMINAL HYSTERECTOMY     ANKLE SURGERY Right 1995   APPENDECTOMY     BIOPSY  01/07/2018   Procedure: BIOPSY;  Surgeon: [name], MD;  Location: [facility];  Service: Gastroenterology;;   BREAST SURGERY Bilateral    mastectomy   COLONOSCOPY WITH PROPOFOL N/A 01/07/2018   Procedure: COLONOSCOPY WITH PROPOFOL;  Surgeon: [name], MD;  Location: [facility];  Service: Gastroenterology;  Laterality: N/A;   ESOPHAGOGASTRODUODENOSCOPY (EGD) WITH PROPOFOL N/A 01/06/2018   Procedure: ESOPHAGOGASTRODUODENOSCOPY (EGD) WITH PROPOFOL;  Surgeon: [name], MD;  Location: [facility];  Service: Gastroenterology;  Laterality: N/A;   HERNIA REPAIR  04-26-2010   IR IMAGING GUIDED PORT INSERTION  10/29/2021   MASS EXCISION Left 01/26/2013   Procedure: EXCISION LEFT CHEST WALL MASS AND LEFT ABDOMNAL WALL MASS;  Surgeon: Harl Bowie, MD;  Location: [facility];  Service: General;  Laterality: Left;   MASS EXCISION Left 09/20/2014   Procedure: EXCISION OF LEFT CHEST WALL  MASS;  Surgeon: Coralie Keens, MD;  Location: [facility];  Service: General;  Laterality: Left;   TEE WITHOUT CARDIOVERSION N/A 12/08/2017   Procedure: TRANSESOPHAGEAL ECHOCARDIOGRAM (TEE);  Surgeon: Lelon Perla, MD;  Location: [facility];  Service: Cardiovascular;  Laterality: N/A;   TOTAL HIP ARTHROPLASTY Right 01/05/2017   TOTAL HIP ARTHROPLASTY Right 01/05/2017   Procedure: TOTAL HIP ARTHROPLASTY ANTERIOR APPROACH;  Surgeon: Frederik Pear, MD;  Location: [facility];  Service: Orthopedics;  Laterality: Right;    Social History   Socioeconomic History   Marital status: Married    Spouse name: Not on file   Number of children: Not on file   Years of education: Not on file   Highest education level: Not on file  Occupational History   Occupation: retired Education officer, museum  Tobacco Use   Smoking status: Former    Types: Cigarettes    Quit date: 04/14/1972    Years since quitting: 50.0   Smokeless tobacco: Never  Vaping Use   Vaping Use: Never used  Substance and Sexual Activity   Alcohol use: Yes    Comment: occasional   Drug use: Not Currently   Sexual activity: Yes    Birth control/protection: Surgical  Other Topics Concern  Not on file  Social History Narrative   Not on file   Social Determinants of Health   Financial Resource Strain: Not on file  Food Insecurity: Not on file  Transportation Needs: Not on file  Physical Activity: Not on file  Stress: Not on file  Social Connections: Not on file  Intimate Partner Violence: Not on file    Family History  Problem Relation Age of Onset   Breast cancer Cousin        maternal first cousin   Uterine cancer Cousin        maternal first cousin     Current Outpatient Medications:    amLODipine (NORVASC) 10 MG tablet, Take 10 mg by mouth daily., Disp: , Rfl:    apixaban (ELIQUIS) 5 MG TABS tablet, Take 1 tablet (5 mg total) by mouth 2 (two) times daily., Disp: 180 tablet, Rfl: 3   lidocaine-prilocaine  (EMLA) cream, Apply to affected area once, Disp: 30 g, Rfl: 3   metFORMIN (GLUCOPHAGE) 500 MG tablet, Take 500 mg by mouth daily with breakfast., Disp: , Rfl:    metoprolol succinate (TOPROL-XL) 25 MG 24 hr tablet, Take 25 mg by mouth daily., Disp: , Rfl:    olmesartan-hydrochlorothiazide (BENICAR HCT) 40-12.5 MG tablet, Take 1 tablet by mouth daily., Disp: , Rfl:    ondansetron (ZOFRAN) 8 MG tablet, Take 1 tablet (8 mg total) by mouth every 8 (eight) hours as needed for nausea or vomiting., Disp: 30 tablet, Rfl: 1   prochlorperazine (COMPAZINE) 10 MG tablet, Take 1 tablet (10 mg total) by mouth every 6 (six) hours as needed for nausea or vomiting., Disp: 30 tablet, Rfl: 1   SM IRON 325 (65 Fe) MG tablet, Take 325 mg by mouth daily., Disp: , Rfl:    Turmeric (QC TUMERIC COMPLEX PO), Take 1 tablet by mouth daily., Disp: , Rfl:    TYLENOL 325 MG tablet, Take 325 mg by mouth daily., Disp: , Rfl:    VITAMIN D PO, Take 1 tablet by mouth daily., Disp: , Rfl:   PHYSICAL EXAM: ECOG FS:1 - Symptomatic but completely ambulatory    Vitals:   04/24/22 1147  BP: (!) 163/72  Pulse: 60  Resp: 16  Temp: 97.7 F (36.5 C)  TempSrc: Temporal  SpO2: 100%  Weight: 194 lb 1.6 oz (88 kg)  Height: '5\' 7"'$  (1.702 m)   Physical Exam Vitals and nursing note reviewed.  Constitutional:      Appearance: She is well-developed. She is not ill-appearing or toxic-appearing.  HENT:     Head: Normocephalic.     Nose: Nose normal.  Eyes:     Conjunctiva/sclera: Conjunctivae normal.  Neck:     Vascular: No JVD.  Cardiovascular:     Rate and Rhythm: Normal rate and regular rhythm.     Pulses:          Radial pulses are 2+ on the right side and 2+ on the left side.     Heart sounds: Normal heart sounds.  Pulmonary:     Effort: Pulmonary effort is normal.     Breath sounds: Normal breath sounds.  Abdominal:     General: There is no distension.  Musculoskeletal:     Cervical back: Normal range of motion.      Comments: LUE lymphedema  Skin:    General: Skin is warm and dry.     Capillary Refill: Capillary refill takes less than 2 seconds.  Neurological:     Mental Status:  She is oriented to person, place, and time.        LABORATORY DATA: I have reviewed the data as listed    Latest Ref Rng & Units 04/03/2022   12:50 PM 03/05/2022    1:32 PM 02/06/2022    1:22 PM  CBC  WBC 4.0 - 10.5 K/uL 5.6  6.1  6.1   Hemoglobin 12.0 - 15.0 g/dL 12.0  12.0  12.1   Hematocrit 36.0 - 46.0 % 36.2  36.9  37.6   Platelets 150 - 400 K/uL 241  230  229         Latest Ref Rng & Units 04/03/2022   12:50 PM 03/05/2022    1:32 PM 02/06/2022    1:22 PM  CMP  Glucose 70 - 99 mg/dL 123  159  143   BUN 8 - 23 mg/dL '20  22  23   '$ Creatinine 0.44 - 1.00 mg/dL 0.79  0.71  0.75   Sodium 135 - 145 mmol/L 139  137  140   Potassium 3.5 - 5.1 mmol/L 3.7  3.9  3.8   Chloride 98 - 111 mmol/L 104  104  105   CO2 22 - 32 mmol/L '28  26  28   '$ Calcium 8.9 - 10.3 mg/dL 9.3  9.1  9.1   Total Protein 6.5 - 8.1 g/dL 6.9  7.0  6.8   Total Bilirubin 0.3 - 1.2 mg/dL 0.5  0.5  0.4   Alkaline Phos 38 - 126 U/L 56  51  57   AST 15 - 41 U/L '13  15  10   '$ ALT 0 - 44 U/L '8  10  8        '$ RADIOGRAPHIC STUDIES (from last 24 hours if applicable) I have personally reviewed the radiological images as listed and agreed with the findings in the report. No results found.    Visit Diagnosis: 1. Malignant neoplasm of lower-inner quadrant of left breast in female, estrogen receptor positive ([place])      Orders Placed This Encounter  Procedures   Consent Attestation for Oncology Treatment    Order Specific Question:   The patient is informed of risks, benefits, side-effects of the prescribed oncology treatment. Potential short term and long term side effects and response rates discussed. After a long discussion, the patient made informed decision to proceed.    Answer:   Yes   CBC with Differential ([facility] Only)     Standing Status:   Future    Standing Expiration Date:   05/02/2023   CMP ([facility] only)    Standing Status:   Future    Standing Expiration Date:   05/02/2023   CBC with Differential ([facility] Only)    Standing Status:   Future    Standing Expiration Date:   05/23/2023   CMP ([facility] only)    Standing Status:   Future    Standing Expiration Date:   05/23/2023   PHYSICIAN COMMUNICATION ORDER    A baseline Echo/[place] should be obtained prior to initiation of Ado-trastuzumab emtansine, at 3, 6, 9 months during Ado-trastuzumab emtansine treatment.    All questions were answered. The patient knows to call the clinic with any problems, questions or concerns. No barriers to learning was detected.  I have spent a total of 30 minutes minutes of face-to-face and non-face-to-face time, preparing to see the patient, obtaining and/or reviewing separately obtained history, performing a medically appropriate examination, counseling and educating the patient,  ordering tests, documenting clinical information in the electronic health record, and care coordination (communications with other health care professionals or caregivers).   Thank you for allowing me to participate in the care of this patient.    Benay Pike, MD Department of Hematology/Oncology [place] [facility] Phone: [phone]  [phone]

## 2022-04-24 NOTE — Progress Notes (Signed)
DISCONTINUE ON PATHWAY REGIMEN - Breast     Cycle 1: A cycle is 21 days:     Trastuzumab-xxxx    Cycles 2 and beyond: A cycle is every 21 days:     Trastuzumab-xxxx   **Always confirm dose/schedule in your pharmacy ordering system**  REASON: Disease Progression PRIOR TREATMENT: [id number]:  Trastuzumab 8/6 mg/kg IV q21 Days TREATMENT RESPONSE: Progressive Disease (PD)  START OFF PATHWAY REGIMEN - Breast   [id number]:Ado-Trastuzumab Emtansine 3.6 mg/kg IV D1 q21 Days:   A cycle is every 21 days:     Ado-trastuzumab emtansine   **Always confirm dose/schedule in your pharmacy ordering system**  Patient Characteristics: Distant Metastases or Locoregional Recurrent Disease - Unresected or Locally Advanced Unresectable Disease Progressing after Neoadjuvant and Local Therapies, HER2 Positive, ER Positive, HER2-Targeted Therapy (Concurrent with Endocrine Therapy) Therapeutic Status: Locoregional Recurrent Disease - Unresected HER2 Status: Positive (+) ER Status: Positive (+) PR Status: Positive (+) Intent of Therapy: Non-Curative / Palliative Intent, Discussed with Patient

## 2022-04-24 NOTE — Telephone Encounter (Signed)
Called pt to make aware of ECHO appt for 1/15 at 8 a.m. Voice message was left on pt personal phone. Advised to call for any other assistance.

## 2022-04-28 ENCOUNTER — Ambulatory Visit (HOSPITAL_COMMUNITY)
Admission: RE | Admit: 2022-04-28 | Discharge: 2022-04-28 | Disposition: A | Discharge status: Home / Self Care | Payer: Medicare Other | Source: Ambulatory Visit | Attending: Hematology and Oncology | Admitting: Hematology and Oncology

## 2022-04-28 DIAGNOSIS — C50312 Malignant neoplasm of lower-inner quadrant of left female breast: Secondary | ICD-10-CM | POA: Diagnosis not present

## 2022-04-28 DIAGNOSIS — E119 Type 2 diabetes mellitus without complications: Secondary | ICD-10-CM | POA: Insufficient documentation

## 2022-04-28 DIAGNOSIS — Z170 Estrogen receptor positive status [ER+]: Secondary | ICD-10-CM | POA: Diagnosis not present

## 2022-04-28 DIAGNOSIS — I119 Hypertensive heart disease without heart failure: Secondary | ICD-10-CM | POA: Insufficient documentation

## 2022-04-28 DIAGNOSIS — Z01818 Encounter for other preprocedural examination: Secondary | ICD-10-CM | POA: Diagnosis not present

## 2022-04-28 DIAGNOSIS — Z0189 Encounter for other specified special examinations: Secondary | ICD-10-CM

## 2022-04-28 LAB — ECHOCARDIOGRAM COMPLETE
AR max vel: 2.55 cm2
AV Area VTI: 2.42 cm2
AV Area mean vel: 2.42 cm2
AV Mean grad: 3.5 mmHg
AV Peak grad: 6 mmHg
Ao pk vel: 1.22 m/s
Area-P 1/2: 3.62 cm2
Calc EF: 63 %
MV M vel: 3.83 m/s
MV Peak grad: 58.6 mmHg
S' Lateral: 3.6 cm
Single Plane A2C EF: 59.4 %
Single Plane A4C EF: 63 %

## 2022-04-28 NOTE — Progress Notes (Signed)
  Echocardiogram 2D Echocardiogram has been performed.  Tami Schmidt 04/28/2022, 8:59 AM

## 2022-05-01 ENCOUNTER — Inpatient Hospital Stay: Payer: Medicare Other

## 2022-05-01 ENCOUNTER — Inpatient Hospital Stay: Payer: Medicare Other | Admitting: Hematology and Oncology

## 2022-05-01 ENCOUNTER — Encounter: Payer: Self-pay | Admitting: Hematology and Oncology

## 2022-05-01 ENCOUNTER — Other Ambulatory Visit: Payer: Self-pay

## 2022-05-01 VITALS — BP 162/63 | HR 70 | Temp 97.7°F | Resp 16 | Ht 67.0 in | Wt 195.4 lb

## 2022-05-01 VITALS — BP 141/69 | HR 62 | Resp 14

## 2022-05-01 DIAGNOSIS — Z170 Estrogen receptor positive status [ER+]: Secondary | ICD-10-CM | POA: Diagnosis not present

## 2022-05-01 DIAGNOSIS — C50312 Malignant neoplasm of lower-inner quadrant of left female breast: Secondary | ICD-10-CM

## 2022-05-01 DIAGNOSIS — Z5112 Encounter for antineoplastic immunotherapy: Secondary | ICD-10-CM | POA: Diagnosis not present

## 2022-05-01 DIAGNOSIS — E119 Type 2 diabetes mellitus without complications: Secondary | ICD-10-CM | POA: Diagnosis not present

## 2022-05-01 DIAGNOSIS — C50919 Malignant neoplasm of unspecified site of unspecified female breast: Secondary | ICD-10-CM

## 2022-05-01 LAB — CBC WITH DIFFERENTIAL (CANCER CENTER ONLY)
Abs Immature Granulocytes: 0.02 10*3/uL (ref 0.00–0.07)
Basophils Absolute: 0 10*3/uL (ref 0.0–0.1)
Basophils Relative: 1 %
Eosinophils Absolute: 0.2 10*3/uL (ref 0.0–0.5)
Eosinophils Relative: 4 %
HCT: 36.2 % (ref 36.0–46.0)
Hemoglobin: 11.6 g/dL — ABNORMAL LOW (ref 12.0–15.0)
Immature Granulocytes: 0 %
Lymphocytes Relative: 24 %
Lymphs Abs: 1.3 10*3/uL (ref 0.7–4.0)
MCH: 25.5 pg — ABNORMAL LOW (ref 26.0–34.0)
MCHC: 32 g/dL (ref 30.0–36.0)
MCV: 79.6 fL — ABNORMAL LOW (ref 80.0–100.0)
Monocytes Absolute: 0.3 10*3/uL (ref 0.1–1.0)
Monocytes Relative: 6 %
Neutro Abs: 3.5 10*3/uL (ref 1.7–7.7)
Neutrophils Relative %: 65 %
Platelet Count: 217 10*3/uL (ref 150–400)
RBC: 4.55 MIL/uL (ref 3.87–5.11)
RDW: 15.3 % (ref 11.5–15.5)
WBC Count: 5.4 10*3/uL (ref 4.0–10.5)
nRBC: 0 % (ref 0.0–0.2)

## 2022-05-01 LAB — CMP (CANCER CENTER ONLY)
ALT: 8 U/L (ref 0–44)
AST: 10 U/L — ABNORMAL LOW (ref 15–41)
Albumin: 3.7 g/dL (ref 3.5–5.0)
Alkaline Phosphatase: 59 U/L (ref 38–126)
Anion gap: 8 (ref 5–15)
BUN: 18 mg/dL (ref 8–23)
CO2: 27 mmol/L (ref 22–32)
Calcium: 8.9 mg/dL (ref 8.9–10.3)
Chloride: 103 mmol/L (ref 98–111)
Creatinine: 0.68 mg/dL (ref 0.44–1.00)
GFR, Estimated: >60 mL/min (ref >60)
Glucose, Bld: 219 mg/dL — ABNORMAL HIGH (ref 70–99)
Potassium: 3.7 mmol/L (ref 3.5–5.1)
Sodium: 138 mmol/L (ref 135–145)
Total Bilirubin: 0.4 mg/dL (ref 0.3–1.2)
Total Protein: 6.2 g/dL — ABNORMAL LOW (ref 6.5–8.1)

## 2022-05-01 LAB — HEMOGLOBIN A1C
Hgb A1c MFr Bld: 6.6 % — ABNORMAL HIGH (ref 4.8–5.6)
Mean Plasma Glucose: 142.72 mg/dL

## 2022-05-01 MED ORDER — FULVESTRANT 250 MG/5ML IM SOSY
500.0000 mg | PREFILLED_SYRINGE | INTRAMUSCULAR | Status: DC
  Administered 2022-05-01: 500 mg via INTRAMUSCULAR
  Filled 2022-05-01: qty 10

## 2022-05-01 MED ORDER — SODIUM CHLORIDE 0.9 % IV SOLN
3.6000 mg/kg | Freq: Once | INTRAVENOUS | Status: AC
  Administered 2022-05-01: 320 mg via INTRAVENOUS
  Filled 2022-05-01: qty 16

## 2022-05-01 MED ORDER — DIPHENHYDRAMINE HCL 25 MG PO CAPS
50.0000 mg | ORAL_CAPSULE | Freq: Once | ORAL | Status: AC
  Administered 2022-05-01: 50 mg via ORAL
  Filled 2022-05-01: qty 2

## 2022-05-01 MED ORDER — SODIUM CHLORIDE 0.9% FLUSH
10.0000 mL | Freq: Once | INTRAVENOUS | Status: AC
  Administered 2022-05-01: 10 mL

## 2022-05-01 MED ORDER — SODIUM CHLORIDE 0.9% FLUSH
10.0000 mL | INTRAVENOUS | Status: DC | PRN
  Administered 2022-05-01: 10 mL

## 2022-05-01 MED ORDER — SODIUM CHLORIDE 0.9 % IV SOLN: Freq: Once | INTRAVENOUS | Status: AC

## 2022-05-01 MED ORDER — HEPARIN SOD (PORK) LOCK FLUSH 100 UNIT/ML IV SOLN
500.0000 [IU] | Freq: Once | INTRAVENOUS | Status: AC | PRN
  Administered 2022-05-01: 500 [IU]

## 2022-05-01 MED ORDER — PROCHLORPERAZINE MALEATE 10 MG PO TABS
10.0000 mg | ORAL_TABLET | Freq: Once | ORAL | Status: AC
  Administered 2022-05-01: 10 mg via ORAL
  Filled 2022-05-01: qty 1

## 2022-05-01 MED ORDER — ACETAMINOPHEN 325 MG PO TABS
650.0000 mg | ORAL_TABLET | Freq: Once | ORAL | Status: AC
  Administered 2022-05-01: 650 mg via ORAL
  Filled 2022-05-01: qty 2

## 2022-05-01 MED ORDER — SODIUM CHLORIDE 0.9 % IV SOLN: 3.6000 mg/kg | Freq: Once | INTRAVENOUS | Status: DC

## 2022-05-01 NOTE — Patient Instructions (Signed)
[facility]  Discharge Instructions: Thank you for choosing [facility] to provide your oncology and hematology care.   If you have a lab appointment with the [facility], please go directly to the [facility] and check in at the registration area.   Wear comfortable clothing and clothing appropriate for easy access to any Portacath or PICC line.   We strive to give you quality time with your provider. You may need to reschedule your appointment if you arrive late (15 or more minutes).  Arriving late affects you and other patients whose appointments are after yours.  Also, if you miss three or more appointments without notifying the office, you may be dismissed from the clinic at the provider's discretion.      For prescription refill requests, have your pharmacy contact our office and allow 72 hours for refills to be completed.    Today you received the following chemotherapy and/or immunotherapy agents: Kadcyla   To help prevent nausea and vomiting after your treatment, we encourage you to take your nausea medication as directed.  BELOW ARE SYMPTOMS THAT SHOULD BE REPORTED IMMEDIATELY: *FEVER GREATER THAN 100.4 F (38 C) OR HIGHER *CHILLS OR SWEATING *NAUSEA AND VOMITING THAT IS NOT CONTROLLED WITH YOUR NAUSEA MEDICATION *UNUSUAL SHORTNESS OF BREATH *UNUSUAL BRUISING OR BLEEDING *URINARY PROBLEMS (pain or burning when urinating, or frequent urination) *BOWEL PROBLEMS (unusual diarrhea, constipation, pain near the anus) TENDERNESS IN MOUTH AND THROAT WITH OR WITHOUT PRESENCE OF ULCERS (sore throat, sores in mouth, or a toothache) UNUSUAL RASH, SWELLING OR PAIN  UNUSUAL VAGINAL DISCHARGE OR ITCHING   Items with * indicate a potential emergency and should be followed up as soon as possible or go to the Emergency Department if any problems should occur.  Please show the CHEMOTHERAPY ALERT CARD or IMMUNOTHERAPY ALERT CARD at check-in to the  Emergency Department and triage nurse.  Should you have questions after your visit or need to cancel or reschedule your appointment, please contact [facility]  Dept: [phone]  and follow the prompts.  Office hours are 8:00 a.m. to 4:30 p.m. Monday - Friday. Please note that voicemails left after 4:00 p.m. may not be returned until the following business day.  We are closed weekends and major holidays. You have access to a nurse at all times for urgent questions. Please call the main number to the clinic Dept: [phone] and follow the prompts.   For any non-urgent questions, you may also contact your provider using MyChart. We now offer e-Visits for anyone 77 and older to request care online for non-urgent symptoms. For details visit [email or web address].   Also download the MyChart app! Go to the app store, search "MyChart", open the app, select , and log in with your MyChart username and password.

## 2022-05-01 NOTE — Progress Notes (Signed)
[place]    Patient Care Team: [name], MD (Inactive) as PCP - General (Family Medicine) Gentry Fitz, MD as Consulting Physician (Family Medicine) Rockwell Germany, RN as Oncology Nurse Navigator Mauro Kaufmann, RN as Oncology Nurse Navigator Benay Pike, MD as Consulting Physician (Hematology and Oncology)    Name of the patient: Tami Schmidt  [id number]  February 06, 1933   Date of visit: 05/01/2022   Chief Complaint/Reason for visit: toxicity check   Current Therapy: Kadcyla   ASSESSMENT & PLAN: Patient is a 89 y.o. 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 Chryl Heck.  I have viewed most recent oncology note and lab work.    #)Estrogen positive progesterone positive and HER2 positive breast cancer recurrence diagnosed in November 2022. - Labs including CBC and CMP are acceptable for treatment today. - Had restaging CT 03/25/22 that shows  mild disease progression with enlarging left axillary and subpectoral lymphadenopathy. Likely has benign mildly enlarged subcarinal lymph node as well that can be followed on upcoming studies.  We have previously discussed and today as well regarding the adverse effects of Kadcyla including but not limited to fatigue, nausea, vomiting, diarrhea, increased risk of infections, cardiotoxicity, neuropathy, cytopenias etc. Most recent ECHO satisfactory to proceed with Kadcyla. She will RTC in 3 weeks for follow up. No new concerns. If for any reason she can't tolerate kadcyla, given oligometastatic disease, we can see if there is any role for re irradiation.  #) Symptom management: Fatigue -Patient has ongoing fatigue from her current treatment. It is manageable.  -No pain reported. She she continues to take Eliquis as prescribed and I reminded her this will be lifelong anticoagulation based on high risk factors and  history.  Heme/Onc History: Oncology History  Malignant neoplasm of lower-inner quadrant of left breast in female, estrogen receptor positive ([place])  06/16/2013 Initial Diagnosis   Malignant neoplasm of lower-inner quadrant of left breast in female, estrogen receptor positive ([place])   05/30/2021 - 04/03/2022 Chemotherapy   Patient is on Treatment Plan : BREAST Trastuzumab q28d      Genetic Testing   [facility] CancerNext-Expanded Panel was Negative. 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 Report date is 07/22/2021.  The CancerNext-Expanded gene panel offered by [facility] and includes sequencing, rearrangement, and RNA analysis for the following 77 genes: AIP, ALK, APC, ATM, AXIN2, BAP1, BARD1, BLM, BMPR1A, BRCA1, BRCA2, BRIP1, CDC73, CDH1, CDK4, CDKN1B, CDKN2A, CHEK2, CTNNA1, DICER1, FANCC, FH, FLCN, GALNT12, KIF1B, LZTR1, MAX, MEN1, MET, MLH1, MSH2, MSH3, MSH6, MUTYH, NBN, NF1, NF2, NTHL1, PALB2, PHOX2B, PMS2, POT1, PRKAR1A, PTCH1, PTEN, RAD51C, RAD51D, RB1, RECQL, RET, SDHA, SDHAF2, SDHB, SDHC, SDHD, SMAD4, SMARCA4, SMARCB1, SMARCE1, STK11, SUFU, TMEM127, TP53, TSC1, TSC2, VHL and XRCC2 (sequencing and deletion/duplication); EGFR, EGLN1, HOXB13, KIT, MITF, PDGFRA, POLD1, and POLE (sequencing only); EPCAM and GREM1 (deletion/duplication only).    05/01/2022 -  Chemotherapy   Patient is on Treatment Plan : BREAST ADO-Trastuzumab Emtansine (Kadcyla) q21d      Interval history-: Tami Schmidt is a 89 y.o. female with oncologic history as above seen today for toxicity check prior to treatment day 1 cycle 13. Patient presents unaccompanied to clinic today.  Patient states since last treatment she has overall been doing well. She continues to complain of some manageable fatigue. She also noted some LUE lymphedema which may have gotten worse in the  past 2 days. No other complaints today Rest of the pertinent 10 point ROS reviewed and negative  Allergies   Allergen Reactions   Bactrim Swelling    SWELLING OF MOUTH/FACE.   Lisinopril Swelling    SWELLING OF MOUTH/FACE.   Vasotec Swelling    SWELLING OF MOUTH/FACE.   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   Diabetes mellitus    fasting 90-100   HTN (hypertension) 03/25/2013   Hx of radiation therapy    breasts hx   Hypercholesterolemia    Hypertension    Lymphedema of arm    left arm   Pulmonary embolism ([place])    Type II or unspecified type diabetes mellitus without mention of complication, not stated as uncontrolled 03/25/2013     Past Surgical History:  Procedure Laterality Date   ABDOMINAL HYSTERECTOMY     ANKLE SURGERY Right 1995   APPENDECTOMY     BIOPSY  01/07/2018   Procedure: BIOPSY;  Surgeon: [name], MD;  Location: [facility];  Service: Gastroenterology;;   BREAST SURGERY Bilateral    mastectomy   COLONOSCOPY WITH PROPOFOL N/A 01/07/2018   Procedure: COLONOSCOPY WITH PROPOFOL;  Surgeon: [name], MD;  Location: [facility];  Service: Gastroenterology;  Laterality: N/A;   ESOPHAGOGASTRODUODENOSCOPY (EGD) WITH PROPOFOL N/A 01/06/2018   Procedure: ESOPHAGOGASTRODUODENOSCOPY (EGD) WITH PROPOFOL;  Surgeon: [name], MD;  Location: [facility];  Service: Gastroenterology;  Laterality: N/A;   HERNIA REPAIR  04-26-2010   IR IMAGING GUIDED PORT INSERTION  10/29/2021   MASS EXCISION Left 01/26/2013   Procedure: EXCISION LEFT CHEST WALL MASS AND LEFT ABDOMNAL WALL MASS;  Surgeon: Harl Bowie, MD;  Location: [place];  Service: General;  Laterality: Left;   MASS EXCISION Left 09/20/2014   Procedure: EXCISION OF LEFT CHEST WALL MASS;  Surgeon: Coralie Keens, MD;  Location: [place];  Service: General;  Laterality: Left;   TEE WITHOUT  CARDIOVERSION N/A 12/08/2017   Procedure: TRANSESOPHAGEAL ECHOCARDIOGRAM (TEE);  Surgeon: Lelon [place], MD;  Location: [place];  Service: Cardiovascular;  Laterality: N/A;   TOTAL HIP ARTHROPLASTY Right 01/05/2017   TOTAL HIP ARTHROPLASTY Right 01/05/2017   Procedure: TOTAL HIP ARTHROPLASTY ANTERIOR APPROACH;  Surgeon: Frederik Pear, MD;  Location: [place];  Service: Orthopedics;  Laterality: Right;    Social History   Socioeconomic History   Marital status: Married    Spouse name: Not on file   Number of children: Not on file   Years of education: Not on file   Highest education level: Not on file  Occupational History   Occupation: retired Education officer, [employer]  Tobacco Use   Smoking status: Former    Types: Cigarettes    Quit date: 04/14/1972    Years since quitting: 50.0   Smokeless tobacco: Never  Vaping Use   Vaping Use: Never used  Substance and Sexual Activity   Alcohol use: Yes    Comment: occasional   Drug use: Not Currently   Sexual activity: Yes    Birth control/protection: Surgical  Other Topics Concern   Not on file  Social History Narrative   Not on file   Social Determinants of Health   Financial Resource Strain: Not on file  Food Insecurity: Not  on file  Transportation Needs: Not on file  Physical Activity: Not on file  Stress: Not on file  Social Connections: Not on file  Intimate Partner Violence: Not on file    Family History  Problem Relation Age of Onset   Breast cancer Cousin        maternal first cousin   Uterine cancer Cousin        maternal first cousin     Current Outpatient Medications:    amLODipine (NORVASC) 10 MG tablet, Take 10 mg by mouth daily., Disp: , Rfl:    apixaban (ELIQUIS) 5 MG TABS tablet, Take 1 tablet (5 mg total) by mouth 2 (two) times daily., Disp: 180 tablet, Rfl: 3   lidocaine-prilocaine (EMLA) cream, Apply to affected area once, Disp: 30 g, Rfl: 3   metFORMIN (GLUCOPHAGE) 500 MG tablet, Take 500 mg by mouth daily  with breakfast., Disp: , Rfl:    metoprolol succinate (TOPROL-XL) 25 MG 24 hr tablet, Take 25 mg by mouth daily., Disp: , Rfl:    olmesartan-hydrochlorothiazide (BENICAR HCT) 40-12.5 MG tablet, Take 1 tablet by mouth daily., Disp: , Rfl:    ondansetron (ZOFRAN) 8 MG tablet, Take 1 tablet (8 mg total) by mouth every 8 (eight) hours as needed for nausea or vomiting., Disp: 30 tablet, Rfl: 1   prochlorperazine (COMPAZINE) 10 MG tablet, Take 1 tablet (10 mg total) by mouth every 6 (six) hours as needed for nausea or vomiting., Disp: 30 tablet, Rfl: 1   SM IRON 325 (65 Fe) MG tablet, Take 325 mg by mouth daily., Disp: , Rfl:    Turmeric (QC TUMERIC COMPLEX PO), Take 1 tablet by mouth daily., Disp: , Rfl:    TYLENOL 325 MG tablet, Take 325 mg by mouth daily., Disp: , Rfl:    VITAMIN D PO, Take 1 tablet by mouth daily., Disp: , Rfl:   PHYSICAL EXAM: ECOG FS:1 - Symptomatic but completely ambulatory    There were no vitals filed for this visit.  Physical Exam Vitals and nursing note reviewed.  Constitutional:      Appearance: She is well-developed. She is not ill-appearing or toxic-appearing.  HENT:     Head: Normocephalic.     Nose: Nose normal.  Eyes:     Conjunctiva/sclera: Conjunctivae normal.  Neck:     Vascular: No JVD.  Cardiovascular:     Rate and Rhythm: Normal rate and regular rhythm.     Pulses:          Radial pulses are 2+ on the right side and 2+ on the left side.     Heart sounds: Normal heart sounds.  Pulmonary:     Effort: Pulmonary effort is normal.     Breath sounds: Normal breath sounds.  Abdominal:     General: There is no distension.  Musculoskeletal:     Cervical back: Normal range of motion.     Comments: LUE lymphedema  Skin:    General: Skin is warm and dry.     Capillary Refill: Capillary refill takes less than 2 seconds.  Neurological:     Mental Status: She is oriented to person, place, and time.        LABORATORY DATA: I have reviewed the data  as listed    Latest Ref Rng & Units 04/03/2022   12:50 PM 03/05/2022    1:32 PM 02/06/2022    1:22 PM  CBC  WBC 4.0 - 10.5 K/uL 5.6  6.1  6.1  Hemoglobin 12.0 - 15.0 g/dL 12.0  12.0  12.1   Hematocrit 36.0 - 46.0 % 36.2  36.9  37.6   Platelets 150 - 400 K/uL 241  230  229         Latest Ref Rng & Units 04/03/2022   12:50 PM 03/05/2022    1:32 PM 02/06/2022    1:22 PM  CMP  Glucose 70 - 99 mg/dL 123  159  143   BUN 8 - 23 mg/dL '20  22  23   '$ Creatinine 0.44 - 1.00 mg/dL 0.79  0.71  0.75   Sodium 135 - 145 mmol/L 139  137  140   Potassium 3.5 - 5.1 mmol/L 3.7  3.9  3.8   Chloride 98 - 111 mmol/L 104  104  105   CO2 22 - 32 mmol/L '28  26  28   '$ Calcium 8.9 - 10.3 mg/dL 9.3  9.1  9.1   Total Protein 6.5 - 8.1 g/dL 6.9  7.0  6.8   Total Bilirubin 0.3 - 1.2 mg/dL 0.5  0.5  0.4   Alkaline Phos 38 - 126 U/L 56  51  57   AST 15 - 41 U/L '13  15  10   '$ ALT 0 - 44 U/L '8  10  8        '$ RADIOGRAPHIC STUDIES (from last 24 hours if applicable) I have personally reviewed the radiological images as listed and agreed with the findings in the report. No results found.    Visit Diagnosis: No diagnosis found.    No orders of the defined types were placed in this encounter.   All questions were answered. The patient knows to call the clinic with any problems, questions or concerns. No barriers to learning was detected.  I have spent a total of 20 minutes minutes of face-to-face and non-face-to-face time, preparing to see the patient, obtaining and/or reviewing separately obtained history, performing a medically appropriate examination, counseling and educating the patient, ordering tests, documenting clinical information in the electronic health record, and care coordination (communications with other health care professionals or caregivers).   Thank you for allowing me to participate in the care of this patient.    Benay Pike, MD Department of Hematology/Oncology [facility] at [facility] Phone: [phone]  [phone]

## 2022-05-01 NOTE — Progress Notes (Signed)
Pt observed for 90 minutes post first time Kadcyla infusion. Pt tolerated trtmt well w/out incident. VSS at discharge.  Ambulatory w/ cane to lobby.

## 2022-05-01 NOTE — Progress Notes (Signed)
Kadcyla dose 320 mg (will use 160 mg x 2 vials; no waste).  Kennith Center, Pharm.D., CPP 05/01/2022'@1'$ :01 PM

## 2022-05-02 ENCOUNTER — Telehealth: Payer: Self-pay | Admitting: *Deleted

## 2022-05-02 LAB — CANCER ANTIGEN 27.29: CA 27.29: 38.9 U/mL — ABNORMAL HIGH (ref 0.0–38.6)

## 2022-05-02 NOTE — Telephone Encounter (Signed)
Called to f/u with patient after first time treatment. Advised pt to call so that we can f/u.

## 2022-05-22 ENCOUNTER — Other Ambulatory Visit: Payer: Self-pay

## 2022-05-22 ENCOUNTER — Inpatient Hospital Stay: Payer: Medicare Other | Attending: Adult Health | Admitting: Adult Health

## 2022-05-22 ENCOUNTER — Inpatient Hospital Stay: Payer: Medicare Other

## 2022-05-22 ENCOUNTER — Encounter: Payer: Self-pay | Admitting: Adult Health

## 2022-05-22 VITALS — BP 157/64 | HR 73 | Temp 97.5°F | Resp 18 | Wt 192.1 lb

## 2022-05-22 VITALS — BP 130/51 | HR 71 | Temp 98.1°F | Resp 20

## 2022-05-22 DIAGNOSIS — Z170 Estrogen receptor positive status [ER+]: Secondary | ICD-10-CM

## 2022-05-22 DIAGNOSIS — C50919 Malignant neoplasm of unspecified site of unspecified female breast: Secondary | ICD-10-CM

## 2022-05-22 DIAGNOSIS — C7989 Secondary malignant neoplasm of other specified sites: Secondary | ICD-10-CM

## 2022-05-22 DIAGNOSIS — C773 Secondary and unspecified malignant neoplasm of axilla and upper limb lymph nodes: Secondary | ICD-10-CM | POA: Insufficient documentation

## 2022-05-22 DIAGNOSIS — Z7901 Long term (current) use of anticoagulants: Secondary | ICD-10-CM | POA: Insufficient documentation

## 2022-05-22 DIAGNOSIS — Z5112 Encounter for antineoplastic immunotherapy: Secondary | ICD-10-CM | POA: Insufficient documentation

## 2022-05-22 DIAGNOSIS — Z86711 Personal history of pulmonary embolism: Secondary | ICD-10-CM | POA: Diagnosis not present

## 2022-05-22 DIAGNOSIS — C50312 Malignant neoplasm of lower-inner quadrant of left female breast: Secondary | ICD-10-CM | POA: Diagnosis present

## 2022-05-22 DIAGNOSIS — R5383 Other fatigue: Secondary | ICD-10-CM | POA: Insufficient documentation

## 2022-05-22 LAB — CBC WITH DIFFERENTIAL (CANCER CENTER ONLY)
Abs Immature Granulocytes: 0.02 10*3/uL (ref 0.00–0.07)
Basophils Absolute: 0.1 10*3/uL (ref 0.0–0.1)
Basophils Relative: 1 %
Eosinophils Absolute: 0.2 10*3/uL (ref 0.0–0.5)
Eosinophils Relative: 3 %
HCT: 36.8 % (ref 36.0–46.0)
Hemoglobin: 11.8 g/dL — ABNORMAL LOW (ref 12.0–15.0)
Immature Granulocytes: 0 %
Lymphocytes Relative: 37 %
Lymphs Abs: 2.6 10*3/uL (ref 0.7–4.0)
MCH: 25.1 pg — ABNORMAL LOW (ref 26.0–34.0)
MCHC: 32.1 g/dL (ref 30.0–36.0)
MCV: 78.1 fL — ABNORMAL LOW (ref 80.0–100.0)
Monocytes Absolute: 0.5 10*3/uL (ref 0.1–1.0)
Monocytes Relative: 7 %
Neutro Abs: 3.7 10*3/uL (ref 1.7–7.7)
Neutrophils Relative %: 52 %
Platelet Count: 289 10*3/uL (ref 150–400)
RBC: 4.71 MIL/uL (ref 3.87–5.11)
RDW: 15.3 % (ref 11.5–15.5)
WBC Count: 7 10*3/uL (ref 4.0–10.5)
nRBC: 0 % (ref 0.0–0.2)

## 2022-05-22 LAB — CMP (CANCER CENTER ONLY)
ALT: 10 U/L (ref 0–44)
AST: 15 U/L (ref 15–41)
Albumin: 3.8 g/dL (ref 3.5–5.0)
Alkaline Phosphatase: 54 U/L (ref 38–126)
Anion gap: 8 (ref 5–15)
BUN: 22 mg/dL (ref 8–23)
CO2: 28 mmol/L (ref 22–32)
Calcium: 9.5 mg/dL (ref 8.9–10.3)
Chloride: 103 mmol/L (ref 98–111)
Creatinine: 0.71 mg/dL (ref 0.44–1.00)
GFR, Estimated: >60 mL/min (ref >60)
Glucose, Bld: 91 mg/dL (ref 70–99)
Potassium: 4 mmol/L (ref 3.5–5.1)
Sodium: 139 mmol/L (ref 135–145)
Total Bilirubin: 0.5 mg/dL (ref 0.3–1.2)
Total Protein: 6.6 g/dL (ref 6.5–8.1)

## 2022-05-22 MED ORDER — SODIUM CHLORIDE 0.9% FLUSH
10.0000 mL | Freq: Once | INTRAVENOUS | Status: AC
  Administered 2022-05-22: 10 mL

## 2022-05-22 MED ORDER — ACETAMINOPHEN 325 MG PO TABS
650.0000 mg | ORAL_TABLET | Freq: Once | ORAL | Status: AC
  Administered 2022-05-22: 650 mg via ORAL
  Filled 2022-05-22: qty 2

## 2022-05-22 MED ORDER — PROCHLORPERAZINE MALEATE 10 MG PO TABS
10.0000 mg | ORAL_TABLET | Freq: Once | ORAL | Status: AC
  Administered 2022-05-22: 10 mg via ORAL
  Filled 2022-05-22: qty 1

## 2022-05-22 MED ORDER — SODIUM CHLORIDE 0.9 % IV SOLN: Freq: Once | INTRAVENOUS | Status: AC

## 2022-05-22 MED ORDER — DIPHENHYDRAMINE HCL 25 MG PO CAPS
50.0000 mg | ORAL_CAPSULE | Freq: Once | ORAL | Status: AC
  Administered 2022-05-22: 50 mg via ORAL
  Filled 2022-05-22: qty 2

## 2022-05-22 MED ORDER — SODIUM CHLORIDE 0.9% FLUSH
10.0000 mL | INTRAVENOUS | Status: DC | PRN
  Administered 2022-05-22: 10 mL

## 2022-05-22 MED ORDER — SODIUM CHLORIDE 0.9 % IV SOLN
3.6000 mg/kg | Freq: Once | INTRAVENOUS | Status: AC
  Administered 2022-05-22: 320 mg via INTRAVENOUS
  Filled 2022-05-22: qty 16

## 2022-05-22 MED ORDER — HEPARIN SOD (PORK) LOCK FLUSH 100 UNIT/ML IV SOLN
500.0000 [IU] | Freq: Once | INTRAVENOUS | Status: AC | PRN
  Administered 2022-05-22: 500 [IU]

## 2022-05-22 NOTE — Progress Notes (Signed)
[facility] Follow up:    Tami Han, MD (Inactive) No address on file   DIAGNOSIS:  Cancer Staging  Chest wall recurrence of breast cancer [facility]) Staging form: Breast, AJCC 7th Edition - Clinical: Stage Unknown ([place], N1, M0) - Signed by Chauncey Cruel, MD on 07/27/2014  Malignant neoplasm of lower-inner quadrant of left breast in female, estrogen receptor positive (Tami Schmidt) Staging form: Breast, AJCC 7th Edition - Clinical: Stage IA (T1b, N0, M0) - Signed by Chauncey Cruel, MD on 07/27/2014   SUMMARY OF ONCOLOGIC HISTORY: 89 y.o. [place] woman   (1) status post right lumpectomy and axillary dissection in 1993 in [place] for what appears to have been a stage I breast cancer, treated with radiation and then tamoxifen for 5 years   (2) status post right mastectomy 2002 for a recurrence in the right breast, treated adjuvantly with tamoxifen for an additional 5 years   (3) status post left mastectomy and sentinel lymph node sampling 03/07/2003 for a lower inner quadrant pT1b, pN0, stage IA mucinous breast cancer, grade 2; no radiation therapy was given. Tamoxifen was continued until 2009   RECURRENT DISEASE/ LEFT: OCTOBER 2014 (4) excisional biopsy of a left chest wall mass 01/26/2013 showing carcinoma consistent with invasive ductal carcinoma, estrogen receptor 100% positive, progesterone receptor 0% positive, with an MIB-1 of 22% and no HER-2 amplification.    (5)  PET scan on 03/03/2013 confirmed extensive metastatic adenopathy in the left axilla, left subpectoral musculature, and left supraclavicular region   (6)  started on anastrozole daily beginning 03/01/2013, interrupted during radiation therapy, resumed March 2015, discontinued March 2016 because of fatigue and arthralgias   (7) radiation therapy 03/31/2013 through 05/23/2013 Site/dose:   The patient was treated initially with a forward treatment planning technique to the left chest wall in  addition to treatment to the supraclavicular region. This consisted of a 3-D conformal technique. The patient was treated in this fashion to a dose of 50.4 gray. The patient then received a 14 gray boost treatment using an electron field. The total dose was 64.4 gray.   (8) Osteopenia, zometa yearly started 07/27/2014, but poorly tolerated.              (a) bone density scan on 12/27/13 showed a t-score of -1.8              (b) started Denosumab/Prolia April 2017, canceled after one dose per patient   (9) tamoxifen started 07/27/2014, stopped November 2019 (had total 5 years of antiestrogens)   (10) likely thalassemia trait   (11) left subpectoral soft tissue nodule noted 07/17/2015 unchanged through serial scans, most recent 82/95/6213, with no metabolic activity noted in PET scan of 2016 and interval calcification.  This is presumed benign   (12)  ADDITIONAL REGIONAL RECURRENCE:             (A) CT of the chest with contrast 03/05/2021 obtained to evaluate left upper extremity lymphedema as showed acute pulmonary emboli.  There was also left axillary and subpectoral lymphadenopathy             (B) rivaroxaban started 03/05/2021             (C) left breast ultrasound 03/29/2021 shows 2 areas in the infraclavicular tissue consistent with fat necrosis.  There were however multiple retropectoral interpectoral and left axillary masses.             (D) left axillary lymph node biopsy 04/04/2021: IDC grade  3, ER positive, 100%, strong staining intensity, PR positive, 20%, weak staining intensity, Ki-67 of 15%, HER2 positive by IHC, ratio 2.90             (E) Fulvestrant every 4 weeks beginning 04/18/2021             (F) Herceptin every 3 weeks beginning 05/30/2021  Mild progression noted on 12/12 CT chest/abd/pelvis  (G) Kadcyla every 3 weeks beginning 05/01/2022  CURRENT THERAPY: Kadcyla  INTERVAL HISTORY: Tami Schmidt 89 y.o. female returns for follow-up and evaluation prior to receiving Kadcyla  therapy.  Her most recent echocardiogram occurred on April 28, 2022 demonstrating a left ventricular ejection fraction of 60 to 65%.  Tami Schmidt is here to receive her second cycle of Kadcyla.  She tells me that she is tolerating it quite well.  She denies any significant side effects from this.  Patient Active Problem List   Diagnosis Date Noted   Loss of perception for taste 03/17/2022   Primary osteoarthritis involving multiple joints 03/17/2022   Genetic testing 07/24/2021   Microcytosis 04/29/2021   Hypokalemia 04/28/2021   SBO (small bowel obstruction) ([place]) 04/27/2021   Pulmonary embolism ([place])    Agnosia 08/09/2020   Hardening of the aorta (main artery of the heart) ([place]) 08/09/2020   Morbid obesity ([place]) 08/09/2020   Neuropathy 08/09/2020   Primary osteoarthritis 08/09/2020   Sensorineural hearing loss (SNHL) of both ears 08/07/2020   Anosmia 06/05/2020   Tinnitus, bilateral 06/05/2020   GI bleed 07/13/2019   Acute blood loss anemia 07/12/2019   Microcytic anemia 01/05/2018   Melena 01/04/2018   Lower GI bleed 01/04/2018   Type 2 diabetes mellitus without complication ([place]) 50/35/4656   Bacteremia due to methicillin susceptible Staphylococcus aureus (MSSA) 12/02/2017   Tenosynovitis of left wrist 12/02/2017   Infection of left wrist ([place]) 11/30/2017   Primary osteoarthritis of right hip 01/05/2017   Osteoarthritis of right hip 01/03/2017   Lymphedema of upper extremity 01/17/2014   Osteopenia 01/17/2014   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   Dilated pore of [place] 08/02/2013   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   Diabetes mellitus    fasting 90-100   HTN (hypertension) 03/25/2013   Hx of radiation therapy    breasts hx   Hypercholesterolemia    Hypertension    Lymphedema of arm    left arm   Pulmonary embolism ([place])    Type II or unspecified type diabetes mellitus without mention of complication, not stated as uncontrolled 03/25/2013    SURGICAL HISTORY: Past Surgical History:  Procedure Laterality Date   ABDOMINAL HYSTERECTOMY     ANKLE SURGERY Right 1995   APPENDECTOMY     BIOPSY  01/07/2018   Procedure: BIOPSY;  Surgeon: [name], MD;  Location: [facility];  Service: Gastroenterology;;  BREAST SURGERY Bilateral    mastectomy   COLONOSCOPY WITH PROPOFOL N/A 01/07/2018   Procedure: COLONOSCOPY WITH PROPOFOL;  Surgeon: [name], MD;  Location: [facility];  Service: Gastroenterology;  Laterality: N/A;   ESOPHAGOGASTRODUODENOSCOPY (EGD) WITH PROPOFOL N/A 01/06/2018   Procedure: ESOPHAGOGASTRODUODENOSCOPY (EGD) WITH PROPOFOL;  Surgeon: [name], MD;  Location: [facility];  Service: Gastroenterology;  Laterality: N/A;   HERNIA REPAIR  04-26-2010   IR IMAGING GUIDED PORT INSERTION  10/29/2021   MASS EXCISION Left 01/26/2013   Procedure: EXCISION LEFT CHEST WALL MASS AND LEFT ABDOMNAL WALL MASS;  Surgeon: Harl Bowie, MD;  Location: [facility];  Service: General;  Laterality: Left;   MASS EXCISION Left 09/20/2014   Procedure: EXCISION OF LEFT CHEST WALL MASS;  Surgeon: Coralie Keens, MD;  Location: [facility];  Service: General;  Laterality: Left;   TEE WITHOUT CARDIOVERSION N/A 12/08/2017   Procedure:  TRANSESOPHAGEAL ECHOCARDIOGRAM (TEE);  Surgeon: Lelon Perla, MD;  Location: [facility];  Service: Cardiovascular;  Laterality: N/A;   TOTAL HIP ARTHROPLASTY Right 01/05/2017   TOTAL HIP ARTHROPLASTY Right 01/05/2017   Procedure: TOTAL HIP ARTHROPLASTY ANTERIOR APPROACH;  Surgeon: Frederik Pear, MD;  Location: [facility];  Service: Orthopedics;  Laterality: Right;    SOCIAL HISTORY: Social History   Socioeconomic History   Marital status: Married    Spouse name: Not on file   Number of children: Not on file   Years of education: Not on file   Highest education level: Not on file  Occupational History   Occupation: retired Education officer, [employer]  Tobacco Use   Smoking status: Former    Types: Cigarettes    Quit date: 04/14/1972    Years since quitting: 50.1   Smokeless tobacco: Never  Vaping Use   Vaping Use: Never used  Substance and Sexual Activity   Alcohol use: Yes    Comment: occasional   Drug use: Not Currently   Sexual activity: Yes    Birth control/protection: Surgical  Other Topics Concern   Not on file  Social History Narrative   Not on file   Social Determinants of Health   Financial Resource Strain: Not on file  Food Insecurity: Not on file  Transportation Needs: Not on file  Physical Activity: Not on file  Stress: Not on file  Social Connections: Not on file  Intimate Partner Violence: Not on file    FAMILY HISTORY: Family History  Problem Relation Age of Onset   Breast cancer Cousin        maternal first cousin   Uterine cancer Cousin        maternal first cousin    Review of Systems  Constitutional:  Positive for fatigue. Negative for appetite change, chills, fever and unexpected weight change.  HENT:   Negative for hearing loss, lump/mass and trouble swallowing.   Eyes:  Negative for eye problems and icterus.  Respiratory:  Negative for chest tightness, cough and shortness of breath.   Cardiovascular:  Negative for chest pain, leg swelling and  palpitations.  Gastrointestinal:  Negative for abdominal distention, abdominal pain, constipation, diarrhea, nausea and vomiting.  Endocrine: Negative for hot flashes.  Genitourinary:  Negative for difficulty urinating.   Musculoskeletal:  Negative for arthralgias.  Skin:  Negative for itching and rash.  Neurological:  Negative for dizziness, extremity weakness, headaches and numbness.  Hematological:  Negative for adenopathy. Does not bruise/bleed easily.  Psychiatric/Behavioral:  Negative for depression. The patient is not nervous/anxious.  PHYSICAL EXAMINATION  ECOG PERFORMANCE STATUS: 1 - Symptomatic but completely ambulatory  Vitals:   05/22/22 1423  BP: (!) 157/64  Pulse: 73  Resp: 18  Temp: (!) 97.5 F (36.4 C)  SpO2: 100%    Physical Exam Constitutional:      General: She is not in acute distress.    Appearance: Normal appearance. She is not toxic-appearing.  HENT:     Head: Normocephalic and atraumatic.  Eyes:     General: No scleral icterus. Cardiovascular:     Rate and Rhythm: Normal rate and regular rhythm.     Pulses: Normal pulses.     Heart sounds: Normal heart sounds.  Pulmonary:     Effort: Pulmonary effort is normal.     Breath sounds: Normal breath sounds.  Abdominal:     General: Abdomen is flat. Bowel sounds are normal. There is no distension.     Palpations: Abdomen is soft.     Tenderness: There is no abdominal tenderness.  Musculoskeletal:        General: No swelling.     Cervical back: Neck supple.  Lymphadenopathy:     Cervical: No cervical adenopathy.  Skin:    General: Skin is warm and dry.     Findings: No rash.  Neurological:     General: No focal deficit present.     Mental Status: She is alert.  Psychiatric:        Mood and Affect: Mood normal.        Behavior: Behavior normal.     LABORATORY DATA:  CBC    Component Value Date/Time   WBC 7.0 05/22/2022 1403   WBC 6.3 12/10/2021 1322   RBC 4.71 05/22/2022 1403    HGB 11.8 (L) 05/22/2022 1403   HGB 10.5 (L) 01/20/2017 1057   HCT 36.8 05/22/2022 1403   HCT 25.8 (L) 01/05/2018 0513   HCT 32.8 (L) 01/20/2017 1057   PLT 289 05/22/2022 1403   PLT 368 01/20/2017 1057   MCV 78.1 (L) 05/22/2022 1403   MCV 77.8 (L) 01/20/2017 1057   MCH 25.1 (L) 05/22/2022 1403   MCHC 32.1 05/22/2022 1403   RDW 15.3 05/22/2022 1403   RDW 16.0 (H) 01/20/2017 1057   LYMPHSABS 2.6 05/22/2022 1403   LYMPHSABS 1.3 01/20/2017 1057   MONOABS 0.5 05/22/2022 1403   MONOABS 0.4 01/20/2017 1057   EOSABS 0.2 05/22/2022 1403   EOSABS 0.1 01/20/2017 1057   BASOSABS 0.1 05/22/2022 1403   BASOSABS 0.1 01/20/2017 1057    CMP     Component Value Date/Time   NA 139 05/22/2022 1403   NA 139 01/20/2017 1057   K 4.0 05/22/2022 1403   K 3.9 01/20/2017 1057   CL 103 05/22/2022 1403   CO2 28 05/22/2022 1403   CO2 25 01/20/2017 1057   GLUCOSE 91 05/22/2022 1403   GLUCOSE 135 01/20/2017 1057   BUN 22 05/22/2022 1403   BUN 15.6 01/20/2017 1057   CREATININE 0.71 05/22/2022 1403   CREATININE 0.7 01/20/2017 1057   CALCIUM 9.5 05/22/2022 1403   CALCIUM 9.3 01/20/2017 1057   PROT 6.6 05/22/2022 1403   PROT 7.1 01/20/2017 1057   ALBUMIN 3.8 05/22/2022 1403   ALBUMIN 3.4 (L) 01/20/2017 1057   AST 15 05/22/2022 1403   AST 10 01/20/2017 1057   ALT 10 05/22/2022 1403   ALT 14 01/20/2017 1057   ALKPHOS 54 05/22/2022 1403   ALKPHOS 69 01/20/2017 1057   BILITOT 0.5 05/22/2022 1403  BILITOT 0.43 01/20/2017 1057   GFRNONAA >60 05/22/2022 1403   GFRAA >60 07/14/2019 0538        ASSESSMENT and THERAPY PLAN:   Chest wall recurrence of breast cancer (HCC) Alexanderia Gorby is an 89 year old woman with recurrent breast cancer that was first noted in November 2022.  It was biopsy ER positive PR positive and HER2 positive.    Due to progression of this recurrence she was started on Kadcyla January 18 and continues on this every 3 weeks.  She is tolerating this well and will continue to  receive this every 3 weeks.  Her echocardiograms are up-to-date.  I reviewed with her that we will repeat imaging after 4 cycles of treatment.  We discussed her weight loss and I recommended continued healthy diet and also recommended to work on her strength with chair exercises.  For her fatigue I recommended energy conservation and for her to be active when she feels like she has the energy to do so.  She will return in 3 weeks for labs, follow-up, and her next treatment.    All questions were answered. The patient knows to call the clinic with any problems, questions or concerns. We can certainly see the patient much sooner if necessary.  Total encounter time:20 minutes*in face-to-face visit time, chart review, lab review, care coordination, order entry, and documentation of the encounter time.    Wilber Bihari, NP 05/22/22 3:03 PM [facility] [place], [place] [address] Tel. [phone]    Fax. [phone]  *Total Encounter Time as defined by the Centers for Medicare and Medicaid Services includes, in addition to the face-to-face time of a patient visit (documented in the note above) non-face-to-face time: obtaining and reviewing outside history, ordering and reviewing medications, tests or procedures, care coordination (communications with other health care professionals or caregivers) and documentation in the medical record.

## 2022-05-22 NOTE — Assessment & Plan Note (Signed)
Tami Schmidt is an 89 year old woman with recurrent breast cancer that was first noted in November 2022.  It was biopsy ER positive PR positive and HER2 positive.    Due to progression of this recurrence she was started on Kadcyla January 18 and continues on this every 3 weeks.  She is tolerating this well and will continue to receive this every 3 weeks.  Her echocardiograms are up-to-date.  I reviewed with her that we will repeat imaging after 4 cycles of treatment.  We discussed her weight loss and I recommended continued healthy diet and also recommended to work on her strength with chair exercises.  For her fatigue I recommended energy conservation and for her to be active when she feels like she has the energy to do so.  She will return in 3 weeks for labs, follow-up, and her next treatment.

## 2022-05-22 NOTE — Patient Instructions (Signed)
[facility]  Discharge Instructions: Thank you for choosing [facility] to provide your oncology and hematology care.   If you have a lab appointment with the Cancer Center, please go directly to the Cancer Center and check in at the registration area.   Wear comfortable clothing and clothing appropriate for easy access to any Portacath or PICC line.   We strive to give you quality time with your provider. You may need to reschedule your appointment if you arrive late (15 or more minutes).  Arriving late affects you and other patients whose appointments are after yours.  Also, if you miss three or more appointments without notifying the office, you may be dismissed from the clinic at the provider's discretion.      For prescription refill requests, have your pharmacy contact our office and allow 72 hours for refills to be completed.    Today you received the following chemotherapy and/or immunotherapy agents Kadcyla      To help prevent nausea and vomiting after your treatment, we encourage you to take your nausea medication as directed.  BELOW ARE SYMPTOMS THAT SHOULD BE REPORTED IMMEDIATELY: *FEVER GREATER THAN 100.4 F (38 C) OR HIGHER *CHILLS OR SWEATING *NAUSEA AND VOMITING THAT IS NOT CONTROLLED WITH YOUR NAUSEA MEDICATION *UNUSUAL SHORTNESS OF BREATH *UNUSUAL BRUISING OR BLEEDING *URINARY PROBLEMS (pain or burning when urinating, or frequent urination) *BOWEL PROBLEMS (unusual diarrhea, constipation, pain near the anus) TENDERNESS IN MOUTH AND THROAT WITH OR WITHOUT PRESENCE OF ULCERS (sore throat, sores in mouth, or a toothache) UNUSUAL RASH, SWELLING OR PAIN  UNUSUAL VAGINAL DISCHARGE OR ITCHING   Items with * indicate a potential emergency and should be followed up as soon as possible or go to the Emergency Department if any problems should occur.  Please show the CHEMOTHERAPY ALERT CARD or IMMUNOTHERAPY ALERT CARD at check-in  to the Emergency Department and triage nurse.  Should you have questions after your visit or need to cancel or reschedule your appointment, please contact [facility]  Dept: [phone]  and follow the prompts.  Office hours are 8:00 a.m. to 4:30 p.m. Monday - Friday. Please note that voicemails left after 4:00 p.m. may not be returned until the following business day.  We are closed weekends and major holidays. You have access to a nurse at all times for urgent questions. Please call the main number to the clinic Dept: [phone] and follow the prompts.   For any non-urgent questions, you may also contact your provider using MyChart. We now offer e-Visits for anyone 18 and older to request care online for non-urgent symptoms. For details visit [email or web address].   Also download the MyChart app! Go to the app store, search "MyChart", open the app, select [facility], and log in with your MyChart username and password.  

## 2022-05-23 LAB — CANCER ANTIGEN 27.29: CA 27.29: 32.8 U/mL (ref 0.0–38.6)

## 2022-05-29 ENCOUNTER — Inpatient Hospital Stay: Payer: Medicare Other

## 2022-05-29 ENCOUNTER — Inpatient Hospital Stay: Payer: Medicare Other | Admitting: Hematology and Oncology

## 2022-06-12 ENCOUNTER — Inpatient Hospital Stay: Payer: Medicare Other

## 2022-06-12 ENCOUNTER — Inpatient Hospital Stay (HOSPITAL_BASED_OUTPATIENT_CLINIC_OR_DEPARTMENT_OTHER): Payer: Medicare Other | Admitting: Hematology and Oncology

## 2022-06-12 ENCOUNTER — Other Ambulatory Visit: Payer: Self-pay

## 2022-06-12 ENCOUNTER — Encounter: Payer: Self-pay | Admitting: Hematology and Oncology

## 2022-06-12 VITALS — BP 156/68 | HR 75 | Resp 18

## 2022-06-12 DIAGNOSIS — C50312 Malignant neoplasm of lower-inner quadrant of left female breast: Secondary | ICD-10-CM

## 2022-06-12 DIAGNOSIS — Z170 Estrogen receptor positive status [ER+]: Secondary | ICD-10-CM

## 2022-06-12 DIAGNOSIS — C50919 Malignant neoplasm of unspecified site of unspecified female breast: Secondary | ICD-10-CM

## 2022-06-12 DIAGNOSIS — Z95828 Presence of other vascular implants and grafts: Secondary | ICD-10-CM

## 2022-06-12 DIAGNOSIS — Z5112 Encounter for antineoplastic immunotherapy: Secondary | ICD-10-CM | POA: Diagnosis not present

## 2022-06-12 LAB — CBC WITH DIFFERENTIAL (CANCER CENTER ONLY)
Abs Immature Granulocytes: 0.01 10*3/uL (ref 0.00–0.07)
Basophils Absolute: 0.1 10*3/uL (ref 0.0–0.1)
Basophils Relative: 1 %
Eosinophils Absolute: 0.2 10*3/uL (ref 0.0–0.5)
Eosinophils Relative: 3 %
HCT: 36.3 % (ref 36.0–46.0)
Hemoglobin: 11.7 g/dL — ABNORMAL LOW (ref 12.0–15.0)
Immature Granulocytes: 0 %
Lymphocytes Relative: 32 %
Lymphs Abs: 2 10*3/uL (ref 0.7–4.0)
MCH: 25.1 pg — ABNORMAL LOW (ref 26.0–34.0)
MCHC: 32.2 g/dL (ref 30.0–36.0)
MCV: 77.7 fL — ABNORMAL LOW (ref 80.0–100.0)
Monocytes Absolute: 0.5 10*3/uL (ref 0.1–1.0)
Monocytes Relative: 8 %
Neutro Abs: 3.5 10*3/uL (ref 1.7–7.7)
Neutrophils Relative %: 56 %
Platelet Count: 235 10*3/uL (ref 150–400)
RBC: 4.67 MIL/uL (ref 3.87–5.11)
RDW: 15.4 % (ref 11.5–15.5)
WBC Count: 6.3 10*3/uL (ref 4.0–10.5)
nRBC: 0 % (ref 0.0–0.2)

## 2022-06-12 LAB — CMP (CANCER CENTER ONLY)
ALT: 13 U/L (ref 0–44)
AST: 17 U/L (ref 15–41)
Albumin: 3.8 g/dL (ref 3.5–5.0)
Alkaline Phosphatase: 57 U/L (ref 38–126)
Anion gap: 9 (ref 5–15)
BUN: 17 mg/dL (ref 8–23)
CO2: 27 mmol/L (ref 22–32)
Calcium: 8.7 mg/dL — ABNORMAL LOW (ref 8.9–10.3)
Chloride: 101 mmol/L (ref 98–111)
Creatinine: 0.65 mg/dL (ref 0.44–1.00)
GFR, Estimated: >60 mL/min (ref >60)
Glucose, Bld: 186 mg/dL — ABNORMAL HIGH (ref 70–99)
Potassium: 3.8 mmol/L (ref 3.5–5.1)
Sodium: 137 mmol/L (ref 135–145)
Total Bilirubin: 0.5 mg/dL (ref 0.3–1.2)
Total Protein: 6.6 g/dL (ref 6.5–8.1)

## 2022-06-12 MED ORDER — HEPARIN SOD (PORK) LOCK FLUSH 100 UNIT/ML IV SOLN
500.0000 [IU] | Freq: Once | INTRAVENOUS | Status: AC | PRN
  Administered 2022-06-12: 500 [IU]

## 2022-06-12 MED ORDER — DIPHENHYDRAMINE HCL 25 MG PO CAPS
50.0000 mg | ORAL_CAPSULE | Freq: Once | ORAL | Status: AC
  Administered 2022-06-12: 50 mg via ORAL
  Filled 2022-06-12: qty 2

## 2022-06-12 MED ORDER — SODIUM CHLORIDE 0.9% FLUSH
10.0000 mL | INTRAVENOUS | Status: DC | PRN
  Administered 2022-06-12: 10 mL

## 2022-06-12 MED ORDER — SODIUM CHLORIDE 0.9 % IV SOLN
3.6000 mg/kg | Freq: Once | INTRAVENOUS | Status: AC
  Administered 2022-06-12: 320 mg via INTRAVENOUS
  Filled 2022-06-12: qty 16

## 2022-06-12 MED ORDER — SODIUM CHLORIDE 0.9 % IV SOLN: Freq: Once | INTRAVENOUS | Status: AC

## 2022-06-12 MED ORDER — PROCHLORPERAZINE MALEATE 10 MG PO TABS
10.0000 mg | ORAL_TABLET | Freq: Once | ORAL | Status: AC
  Administered 2022-06-12: 10 mg via ORAL
  Filled 2022-06-12: qty 1

## 2022-06-12 MED ORDER — ACETAMINOPHEN 325 MG PO TABS
650.0000 mg | ORAL_TABLET | Freq: Once | ORAL | Status: AC
  Administered 2022-06-12: 650 mg via ORAL
  Filled 2022-06-12: qty 2

## 2022-06-12 MED ORDER — SODIUM CHLORIDE 0.9% FLUSH
10.0000 mL | Freq: Once | INTRAVENOUS | Status: AC
  Administered 2022-06-12: 10 mL

## 2022-06-12 NOTE — Patient Instructions (Signed)
[facility]  Discharge Instructions: Thank you for choosing [facility] to provide your oncology and hematology care.   If you have a lab appointment with the [facility], please go directly to the [facility] and check in at the registration area.   Wear comfortable clothing and clothing appropriate for easy access to any Portacath or PICC line.   We strive to give you quality time with your provider. You may need to reschedule your appointment if you arrive late (15 or more minutes).  Arriving late affects you and other patients whose appointments are after yours.  Also, if you miss three or more appointments without notifying the office, you may be dismissed from the clinic at the provider's discretion.      For prescription refill requests, have your pharmacy contact our office and allow 72 hours for refills to be completed.    Today you received the following chemotherapy and/or immunotherapy agents: Tami Schmidt       To help prevent nausea and vomiting after your treatment, we encourage you to take your nausea medication as directed.  BELOW ARE SYMPTOMS THAT SHOULD BE REPORTED IMMEDIATELY: *FEVER GREATER THAN 100.4 F (38 C) OR HIGHER *CHILLS OR SWEATING *NAUSEA AND VOMITING THAT IS NOT CONTROLLED WITH YOUR NAUSEA MEDICATION *UNUSUAL SHORTNESS OF BREATH *UNUSUAL BRUISING OR BLEEDING *URINARY PROBLEMS (pain or burning when urinating, or frequent urination) *BOWEL PROBLEMS (unusual diarrhea, constipation, pain near the anus) TENDERNESS IN MOUTH AND THROAT WITH OR WITHOUT PRESENCE OF ULCERS (sore throat, sores in mouth, or a toothache) UNUSUAL RASH, SWELLING OR PAIN  UNUSUAL VAGINAL DISCHARGE OR ITCHING   Items with * indicate a potential emergency and should be followed up as soon as possible or go to the Emergency Department if any problems should occur.  Please show the CHEMOTHERAPY ALERT CARD or IMMUNOTHERAPY ALERT CARD at  check-in to the Emergency Department and triage nurse.  Should you have questions after your visit or need to cancel or reschedule your appointment, please contact [facility]  Dept: [phone]  and follow the prompts.  Office hours are 8:00 a.m. to 4:30 p.m. Monday - Friday. Please note that voicemails left after 4:00 p.m. may not be returned until the following business day.  We are closed weekends and major holidays. You have access to a nurse at all times for urgent questions. Please call the main number to the clinic Dept: [phone] and follow the prompts.   For any non-urgent questions, you may also contact your provider using MyChart. We now offer e-Visits for anyone 60 and older to request care online for non-urgent symptoms. For details visit [email or web address].   Also download the MyChart app! Go to the app store, search "MyChart", open the app, select [facility], and log in with your MyChart username and password.

## 2022-06-12 NOTE — Progress Notes (Signed)
[place]    Patient Care Team: [name], MD (Inactive) as PCP - General (Family Medicine) Gentry Fitz, MD as Consulting Physician (Family Medicine) Rockwell Germany, RN as Oncology Nurse Navigator Mauro Kaufmann, RN as Oncology Nurse Navigator Benay Pike, MD as Consulting Physician (Hematology and Oncology)    Name of the patient: Tami Schmidt  MR:[id number]  1932-10-11   Date of visit: 06/12/2022   Chief Complaint/Reason for visit: toxicity check   Current Therapy: Kadcyla   ASSESSMENT & PLAN: Patient is a 89 y.o. 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 Chryl Heck.  I have viewed most recent oncology note and lab work.    #)Estrogen positive progesterone positive and HER2 positive breast cancer recurrence diagnosed in November 2022. - Labs including CBC and CMP are acceptable for treatment today. - Had restaging CT 03/25/22 that shows  mild disease progression with enlarging left axillary and subpectoral lymphadenopathy. Likely has benign mildly enlarged subcarinal lymph node as well that can be followed on upcoming studies.  She is now on Kadcyla, tolerating it very well except for some fatigue.  She is here today before her planned cycle 3.  She tells me that she is very worried and depressed about this cancer.  Clinically she appears to have improvement in the left axillary lymphadenopathy.  I have recommended we repeat scans and have reassured her that we have many other medications that we can consider but I do believe Kadcyla might work well for her.  She felt reassured.  Last echo was also reassuring.  This was done on April 28, 2022. She will RTC in 3 weeks for follow up. No new concerns.   #) Symptom management: Fatigue -Patient has ongoing fatigue from her current treatment. It is manageable.  -No pain reported. She she continues to  take Eliquis as prescribed and I reminded her this will be lifelong anticoagulation based on high risk factors and history.  Heme/Onc History: Oncology History  Malignant neoplasm of lower-inner quadrant of left breast in female, estrogen receptor positive ([place])  06/16/2013 Initial Diagnosis   Malignant neoplasm of lower-inner quadrant of left breast in female, estrogen receptor positive ([place])   05/30/2021 - 04/03/2022 Chemotherapy   Patient is on Treatment Plan : BREAST Trastuzumab q28d      Genetic Testing   [facility] CancerNext-Expanded Panel was Negative. 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 Report date is 07/22/2021.  The CancerNext-Expanded gene panel offered by [facility] and includes sequencing, rearrangement, and RNA analysis for the following 77 genes: AIP, ALK, APC, ATM, AXIN2, BAP1, BARD1, BLM, BMPR1A, BRCA1, BRCA2, BRIP1, CDC73, CDH1, CDK4, CDKN1B, CDKN2A, CHEK2, CTNNA1, DICER1, FANCC, FH, FLCN, GALNT12, KIF1B, LZTR1, MAX, MEN1, MET, MLH1, MSH2, MSH3, MSH6, MUTYH, NBN, NF1, NF2, NTHL1, PALB2, PHOX2B, PMS2, POT1, PRKAR1A, PTCH1, PTEN, RAD51C, RAD51D, RB1, RECQL, RET, SDHA, SDHAF2, SDHB, SDHC, SDHD, SMAD4, SMARCA4, SMARCB1, SMARCE1, STK11, SUFU, TMEM127, TP53, TSC1, TSC2, VHL and XRCC2 (sequencing and deletion/duplication); EGFR, EGLN1, HOXB13, KIT, MITF, PDGFRA, POLD1, and POLE (sequencing only); EPCAM and GREM1 (deletion/duplication only).    05/01/2022 -  Chemotherapy   Patient is on Treatment Plan : BREAST ADO-Trastuzumab Emtansine (Kadcyla) q21d      Interval history-: Tami Schmidt is a 89 y.o. female with oncologic history as above seen today for toxicity check prior to treatment cycle 3-day 1 of Kadcyla.  Patient tells me that she is very worried, depressed that the cancer is growing.  She has been tolerating Kadcyla very well except for fatigue.  She once again has blunted taste to spices and she tells me that she is losing  weight because she does not really have much appetite.  She otherwise denies any changes in breathing.  No change in bowel habits other than baseline constipation.  Rest of the pertinent 10 point ROS reviewed and negative  Allergies  Allergen Reactions   Bactrim Swelling    SWELLING OF MOUTH/FACE.   Lisinopril Swelling    SWELLING OF MOUTH/FACE.   Vasotec Swelling    SWELLING OF MOUTH/FACE.   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   Diabetes mellitus    fasting 90-100   HTN (hypertension) 03/25/2013   Hx of radiation therapy    breasts hx   Hypercholesterolemia    Hypertension    Lymphedema of arm    left arm   Pulmonary embolism ([place])    Type II or unspecified type diabetes mellitus without mention of complication, not stated as uncontrolled 03/25/2013     Past Surgical History:  Procedure Laterality Date   ABDOMINAL HYSTERECTOMY     ANKLE SURGERY Right 1995   APPENDECTOMY     BIOPSY  01/07/2018   Procedure: BIOPSY;  Surgeon: [name], MD;  [facility];  Service: Gastroenterology;;   BREAST SURGERY Bilateral    mastectomy   COLONOSCOPY WITH PROPOFOL N/A 01/07/2018   Procedure: COLONOSCOPY WITH PROPOFOL;  Surgeon: [name], MD;  [facility];  Service: Gastroenterology;  Laterality: N/A;   ESOPHAGOGASTRODUODENOSCOPY (EGD) WITH PROPOFOL N/A 01/06/2018   Procedure: ESOPHAGOGASTRODUODENOSCOPY (EGD) WITH PROPOFOL;  Surgeon: [name], MD;  [facility];  Service: Gastroenterology;  Laterality: N/A;   HERNIA REPAIR  04-26-2010   IR IMAGING GUIDED PORT INSERTION  10/29/2021   MASS EXCISION Left 01/26/2013   Procedure: EXCISION LEFT CHEST WALL MASS AND LEFT ABDOMNAL WALL MASS;  Surgeon: Harl Bowie, MD;  Location: [place];  Service:  General;  Laterality: Left;   MASS EXCISION Left 09/20/2014   Procedure: EXCISION OF LEFT CHEST WALL MASS;  Surgeon: Coralie Keens, MD;  Location: [place];  Service: General;  Laterality: Left;   TEE WITHOUT CARDIOVERSION N/A 12/08/2017   Procedure: TRANSESOPHAGEAL ECHOCARDIOGRAM (TEE);  Surgeon: Lelon Perla, MD;  Location: [place];  Service: Cardiovascular;  Laterality: N/A;   TOTAL HIP ARTHROPLASTY Right 01/05/2017   TOTAL HIP ARTHROPLASTY Right 01/05/2017   Procedure: TOTAL HIP ARTHROPLASTY ANTERIOR APPROACH;  Surgeon: Frederik Pear, MD;  Location: [place];  Service: Orthopedics;  Laterality: Right;    Social History   Socioeconomic History   Marital status: Married    Spouse name: Not on file   Number of children: Not on file   Years of education: Not on file   Highest education level: Not on file  Occupational History   Occupation: retired Education officer, museum  Tobacco Use   Smoking status: Former    Types: Cigarettes    Quit date: 04/14/1972    Years since quitting: 50.1   Smokeless tobacco: Never  Vaping Use   Vaping Use: Never used  Substance and Sexual Activity  Alcohol use: Yes    Comment: occasional   Drug use: Not Currently   Sexual activity: Yes    Birth control/protection: Surgical  Other Topics Concern   Not on file  Social History Narrative   Not on file   Social Determinants of Health   Financial Resource Strain: Not on file  Food Insecurity: Not on file  Transportation Needs: Not on file  Physical Activity: Not on file  Stress: Not on file  Social Connections: Not on file  Intimate Partner Violence: Not on file    Family History  Problem Relation Age of Onset   Breast cancer Cousin        maternal first cousin   Uterine cancer Cousin        maternal first cousin     Current Outpatient Medications:    amLODipine (NORVASC) 10 MG tablet, Take 10 mg by mouth daily., Disp: , Rfl:    apixaban (ELIQUIS) 5 MG TABS tablet, Take 1  tablet (5 mg total) by mouth 2 (two) times daily., Disp: 180 tablet, Rfl: 3   lidocaine-prilocaine (EMLA) cream, Apply to affected area once, Disp: 30 g, Rfl: 3   metFORMIN (GLUCOPHAGE) 500 MG tablet, Take 500 mg by mouth daily with breakfast., Disp: , Rfl:    olmesartan-hydrochlorothiazide (BENICAR HCT) 40-12.5 MG tablet, Take 1 tablet by mouth daily., Disp: , Rfl:    ondansetron (ZOFRAN) 8 MG tablet, Take 1 tablet (8 mg total) by mouth every 8 (eight) hours as needed for nausea or vomiting., Disp: 30 tablet, Rfl: 1   prochlorperazine (COMPAZINE) 10 MG tablet, Take 1 tablet (10 mg total) by mouth every 6 (six) hours as needed for nausea or vomiting., Disp: 30 tablet, Rfl: 1   SM IRON 325 (65 Fe) MG tablet, Take 325 mg by mouth daily., Disp: , Rfl:    Turmeric (QC TUMERIC COMPLEX PO), Take 1 tablet by mouth daily., Disp: , Rfl:    TYLENOL 325 MG tablet, Take 325 mg by mouth daily., Disp: , Rfl:    VITAMIN D PO, Take 1 tablet by mouth daily., Disp: , Rfl:   PHYSICAL EXAM: ECOG FS:1 - Symptomatic but completely ambulatory    Vitals:   06/12/22 1248  BP: (!) 160/80  Pulse: 88  Resp: 16  Temp: 97.9 F (36.6 C)  TempSrc: Temporal  SpO2: 100%  Weight: 189 lb 11.2 oz (86 kg)  Height: '5\' 7"'$  (1.702 m)    Physical Exam Vitals and nursing note reviewed.  Constitutional:      Appearance: She is well-developed. She is not ill-appearing or toxic-appearing.  HENT:     Head: Normocephalic.     Nose: Nose normal.  Eyes:     Conjunctiva/sclera: Conjunctivae normal.  Neck:     Vascular: No JVD.  Cardiovascular:     Rate and Rhythm: Normal rate and regular rhythm.     Pulses:          Radial pulses are 2+ on the right side and 2+ on the left side.     Heart sounds: Normal heart sounds.  Pulmonary:     Effort: Pulmonary effort is normal.     Breath sounds: Normal breath sounds.  Chest:     Comments: Left axillary adenopathy improving Abdominal:     General: There is no distension.   Musculoskeletal:     Cervical back: Normal range of motion.     Comments: LUE lymphedema  Skin:    General: Skin is warm and  dry.     Capillary Refill: Capillary refill takes less than 2 seconds.  Neurological:     Mental Status: She is oriented to person, place, and time.        LABORATORY DATA: I have reviewed the data as listed    Latest Ref Rng & Units 06/12/2022   12:17 PM 05/22/2022    2:03 PM 05/01/2022   11:42 AM  CBC  WBC 4.0 - 10.5 K/uL 6.3  7.0  5.4   Hemoglobin 12.0 - 15.0 g/dL 11.7  11.8  11.6   Hematocrit 36.0 - 46.0 % 36.3  36.8  36.2   Platelets 150 - 400 K/uL 235  289  217         Latest Ref Rng & Units 05/22/2022    2:03 PM 05/01/2022   11:42 AM 04/03/2022   12:50 PM  CMP  Glucose 70 - 99 mg/dL 91  219  123   BUN 8 - 23 mg/dL '22  18  20   '$ Creatinine 0.44 - 1.00 mg/dL 0.71  0.68  0.79   Sodium 135 - 145 mmol/L 139  138  139   Potassium 3.5 - 5.1 mmol/L 4.0  3.7  3.7   Chloride 98 - 111 mmol/L 103  103  104   CO2 22 - 32 mmol/L '28  27  28   '$ Calcium 8.9 - 10.3 mg/dL 9.5  8.9  9.3   Total Protein 6.5 - 8.1 g/dL 6.6  6.2  6.9   Total Bilirubin 0.3 - 1.2 mg/dL 0.5  0.4  0.5   Alkaline Phos 38 - 126 U/L 54  59  56   AST 15 - 41 U/L '15  10  13   '$ ALT 0 - 44 U/L '10  8  8        '$ RADIOGRAPHIC STUDIES (from last 24 hours if applicable) I have personally reviewed the radiological images as listed and agreed with the findings in the report. No results found.    Visit Diagnosis: 1. Malignant neoplasm of lower-inner quadrant of left breast in female, estrogen receptor positive ([place])       No orders of the defined types were placed in this encounter.   All questions were answered. The patient knows to call the clinic with any problems, questions or concerns. No barriers to learning was detected.  I have spent a total of 30 minutes minutes of face-to-face and non-face-to-face time, preparing to see the patient, obtaining and/or reviewing separately  obtained history, performing a medically appropriate examination, counseling and educating the patient, ordering tests, documenting clinical information in the electronic health record, and care coordination (communications with other health care professionals or caregivers).   Thank you for allowing me to participate in the care of this patient.    Benay Pike, MD Department of Hematology/Oncology [facility] at [facility] Phone: [phone]  [phone]

## 2022-06-25 ENCOUNTER — Other Ambulatory Visit: Payer: Self-pay | Admitting: *Deleted

## 2022-06-26 ENCOUNTER — Inpatient Hospital Stay: Payer: Medicare Other

## 2022-06-26 ENCOUNTER — Ambulatory Visit (HOSPITAL_COMMUNITY)
Admission: RE | Admit: 2022-06-26 | Discharge: 2022-06-26 | Disposition: A | Discharge status: Home / Self Care | Payer: Medicare Other | Source: Ambulatory Visit | Attending: Hematology and Oncology | Admitting: Hematology and Oncology

## 2022-06-26 DIAGNOSIS — Z170 Estrogen receptor positive status [ER+]: Secondary | ICD-10-CM | POA: Insufficient documentation

## 2022-06-26 DIAGNOSIS — C50312 Malignant neoplasm of lower-inner quadrant of left female breast: Secondary | ICD-10-CM | POA: Insufficient documentation

## 2022-06-26 MED ORDER — IOHEXOL 300 MG/ML  SOLN
100.0000 mL | Freq: Once | INTRAMUSCULAR | Status: AC | PRN
  Administered 2022-06-26: 100 mL via INTRAVENOUS

## 2022-06-26 MED ORDER — HEPARIN SOD (PORK) LOCK FLUSH 100 UNIT/ML IV SOLN
INTRAVENOUS | Status: AC
  Filled 2022-06-26: qty 5

## 2022-06-26 MED ORDER — SODIUM CHLORIDE (PF) 0.9 % IJ SOLN
INTRAMUSCULAR | Status: AC
  Filled 2022-06-26: qty 50

## 2022-06-26 MED ORDER — HEPARIN SOD (PORK) LOCK FLUSH 100 UNIT/ML IV SOLN
500.0000 [IU] | Freq: Once | INTRAVENOUS | Status: AC
  Administered 2022-06-26: 500 [IU] via INTRAVENOUS

## 2022-07-01 ENCOUNTER — Other Ambulatory Visit: Payer: Self-pay

## 2022-07-01 DIAGNOSIS — Z170 Estrogen receptor positive status [ER+]: Secondary | ICD-10-CM

## 2022-07-02 ENCOUNTER — Ambulatory Visit (INDEPENDENT_AMBULATORY_CARE_PROVIDER_SITE_OTHER): Payer: Medicare Other | Admitting: Podiatry

## 2022-07-02 VITALS — BP 153/69

## 2022-07-02 DIAGNOSIS — M79674 Pain in right toe(s): Secondary | ICD-10-CM

## 2022-07-02 DIAGNOSIS — L84 Corns and callosities: Secondary | ICD-10-CM

## 2022-07-02 DIAGNOSIS — B351 Tinea unguium: Secondary | ICD-10-CM

## 2022-07-02 DIAGNOSIS — E1142 Type 2 diabetes mellitus with diabetic polyneuropathy: Secondary | ICD-10-CM | POA: Diagnosis not present

## 2022-07-02 DIAGNOSIS — M79675 Pain in left toe(s): Secondary | ICD-10-CM | POA: Diagnosis not present

## 2022-07-02 NOTE — Progress Notes (Signed)
ANNUAL DIABETIC FOOT EXAM  Subjective: Tami Schmidt presents today {jgcomplaint:23593}.  Chief Complaint  Patient presents with   Nail Problem    DFC  BS-104 A1C-6.5 PCP-Do not have one at the moment    Patient confirms h/o diabetes.  Patient relates {Numbers; 0-100:15068} year h/o diabetes.  Patient denies any h/o foot wounds.  Patient has h/o foot ulcer of {jgPodToeLocator:23637}, which healed via help of ***.  Patient has h/o amputation(s): {jgamp:23617}.  Patient endorses symptoms of foot numbness.   Patient endorses symptoms of foot tingling.  Patient endorses symptoms of burning in feet.  Patient endorses symptoms of pins/needles sensation in feet.  Patient denies any numbness, tingling, burning, or pins/needle sensation in feet.  Patient has been diagnosed with neuropathy.  Risk factors: {jgriskfactors:24044}.  [name], MD (Inactive) is patient's PCP.  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   Diabetes mellitus    fasting 90-100   HTN (hypertension) 03/25/2013   Hx of radiation therapy    breasts hx   Hypercholesterolemia    Hypertension    Lymphedema of arm    left arm   Pulmonary embolism ([facility])    Type II or unspecified type diabetes mellitus without mention of complication, not stated as uncontrolled 03/25/2013   Patient Active Problem List   Diagnosis Date Noted   Port-A-Cath in place 06/12/2022   Loss of perception for taste 03/17/2022   Primary osteoarthritis involving multiple joints 03/17/2022   Genetic testing 07/24/2021   Microcytosis 04/29/2021   Hypokalemia 04/28/2021   SBO (small bowel obstruction) ([place]) 04/27/2021   Pulmonary embolism ([place])    [place] 08/09/2020   Hardening of the aorta (main artery of the heart) ([place]) 08/09/2020   Morbid obesity ([place]) 08/09/2020    Neuropathy 08/09/2020   Primary osteoarthritis 08/09/2020   Sensorineural hearing loss (SNHL) of both ears 08/07/2020   Anosmia 06/05/2020   Tinnitus, bilateral 06/05/2020   GI bleed 07/13/2019   Acute blood loss anemia 07/12/2019   Microcytic anemia 01/05/2018   Melena 01/04/2018   Lower GI bleed 01/04/2018   Type 2 diabetes mellitus without complication ([place]) [id number]   Bacteremia due to methicillin susceptible Staphylococcus aureus (MSSA) 12/02/2017   Tenosynovitis of left wrist 12/02/2017   Infection of left wrist ([place]) 11/30/2017   Primary osteoarthritis of right hip 01/05/2017   Osteoarthritis of right hip 01/03/2017   Lymphedema of upper extremity 01/17/2014   Osteopenia 01/17/2014   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   Dilated pore of [place] 08/02/2013   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 08/02/2013   Scar 08/02/2013   Malignant neoplasm of lower-inner quadrant of left breast in female, estrogen receptor positive ([place]) 06/16/2013   Type II or unspecified type diabetes mellitus without mention of complication, not stated as uncontrolled 03/25/2013   Essential hypertension 03/25/2013   Chest wall recurrence of breast cancer ([place]) 02/21/2013   Abdominal wall mass 01/14/2013   Past Surgical History:  Procedure Laterality Date   ABDOMINAL HYSTERECTOMY     ANKLE SURGERY Right 1995   APPENDECTOMY     BIOPSY  01/07/2018   Procedure: BIOPSY;  Surgeon: [name], MD;  [facility];  Service: Gastroenterology;;   BREAST SURGERY Bilateral    mastectomy   COLONOSCOPY WITH PROPOFOL N/A 01/07/2018  Procedure: COLONOSCOPY WITH PROPOFOL;  Surgeon: [name], MD;  [facility];  Service: Gastroenterology;  Laterality: N/A;   ESOPHAGOGASTRODUODENOSCOPY (EGD) WITH PROPOFOL N/A 01/06/2018   Procedure: ESOPHAGOGASTRODUODENOSCOPY (EGD) WITH PROPOFOL;  Surgeon: [name], MD;  [facility];  Service:  Gastroenterology;  Laterality: N/A;   HERNIA REPAIR  04-26-2010   IR IMAGING GUIDED PORT INSERTION  10/29/2021   MASS EXCISION Left 01/26/2013   Procedure: EXCISION LEFT CHEST WALL MASS AND LEFT ABDOMNAL WALL MASS;  Surgeon: Harl Bowie, MD;  Location: [place];  Service: General;  Laterality: Left;   MASS EXCISION Left 09/20/2014   Procedure: EXCISION OF LEFT CHEST WALL MASS;  Surgeon: Coralie Keens, MD;  Location: [place];  Service: General;  Laterality: Left;   TEE WITHOUT CARDIOVERSION N/A 12/08/2017   Procedure: TRANSESOPHAGEAL ECHOCARDIOGRAM (TEE);  Surgeon: Lelon Perla, MD;  Location: [place];  Service: Cardiovascular;  Laterality: N/A;   TOTAL HIP ARTHROPLASTY Right 01/05/2017   TOTAL HIP ARTHROPLASTY Right 01/05/2017   Procedure: TOTAL HIP ARTHROPLASTY ANTERIOR APPROACH;  Surgeon: Frederik Pear, MD;  Location: [place];  Service: Orthopedics;  Laterality: Right;   Current Outpatient Medications on File Prior to Visit  Medication Sig Dispense Refill   amLODipine (NORVASC) 10 MG tablet Take 10 mg by mouth daily.     apixaban (ELIQUIS) 5 MG TABS tablet Take 1 tablet (5 mg total) by mouth 2 (two) times daily. 180 tablet 3   lidocaine-prilocaine (EMLA) cream Apply to affected area once 30 g 3   metFORMIN (GLUCOPHAGE) 500 MG tablet Take 500 mg by mouth daily with breakfast.     olmesartan-hydrochlorothiazide (BENICAR HCT) 40-12.5 MG tablet Take 1 tablet by mouth daily.     ondansetron (ZOFRAN) 8 MG tablet Take 1 tablet (8 mg total) by mouth every 8 (eight) hours as needed for nausea or vomiting. 30 tablet 1   prochlorperazine (COMPAZINE) 10 MG tablet Take 1 tablet (10 mg total) by mouth every 6 (six) hours as needed for nausea or vomiting. 30 tablet 1   SM IRON 325 (65 Fe) MG tablet Take 325 mg by mouth daily.     Turmeric (QC TUMERIC COMPLEX PO) Take 1 tablet by mouth daily.     TYLENOL 325 MG tablet Take 325 mg by mouth daily.     VITAMIN D PO Take 1 tablet by  mouth daily.     No current facility-administered medications on file prior to visit.    Allergies  Allergen Reactions   Bactrim Swelling    SWELLING OF MOUTH/FACE.   Lisinopril Swelling    SWELLING OF MOUTH/FACE.   Vasotec Swelling    SWELLING OF MOUTH/FACE.   Sulfamethoxazole-Trimethoprim     Other reaction(s): Unknown   Social History   Occupational History   Occupation: retired Education officer, museum  Tobacco Use   Smoking status: Former    Types: Cigarettes    Quit date: 04/14/1972    Years since quitting: 50.2   Smokeless tobacco: Never  Vaping Use   Vaping Use: Never used  Substance and Sexual Activity   Alcohol use: Yes    Comment: occasional   Drug use: Not Currently   Sexual activity: Yes    Birth control/protection: Surgical   Family History  Problem Relation Age of Onset   Breast cancer Cousin        maternal first cousin   Uterine cancer Cousin        maternal first cousin   Product/process development scientist  History  Administered Date(s) Administered   Influenza, High Dose Seasonal PF 01/08/2017, 01/12/2019   Influenza, Quadrivalent, Recombinant, Inj, Pf 01/12/2019     Review of Systems: Negative except as noted in the HPI.   Objective: Vitals:   07/02/22 1348  BP: (!) 153/69    Tami Schmidt is a pleasant 89 y.o. female in NAD. AAO X 3.  Vascular Examination: {jgvascular:23595}  Dermatological Examination: {jgderm:23598}  Neurological Examination: {jgneuro:23601::"Protective sensation intact 5/5 intact bilaterally with 10g monofilament b/l.","Vibratory sensation intact b/l.","Proprioception intact bilaterally."}  Musculoskeletal Examination: {jgmsk:23600}  Footwear Assessment: Does the patient wear appropriate shoes? {Yes,No}. Does the patient need inserts/orthotics? {Yes,No}.  Lab Results  Component Value Date   HGBA1C 6.6 (H) 05/01/2022   No results found. ADA Risk Categorization: Low Risk :  Patient has all of the following: Intact protective  sensation No prior foot ulcer  No severe deformity Pedal pulses present  High Risk  Patient has one or more of the following: Loss of protective sensation Absent pedal pulses Severe Foot deformity History of foot ulcer  Assessment: No diagnosis found.   Plan: No orders of the defined types were placed in this encounter.   No orders of the defined types were placed in this encounter.   None  {jgplan:23602::"-Patient/POA to call should there be question/concern in the interim."} No follow-ups on file.  Marzetta Board, DPM

## 2022-07-03 ENCOUNTER — Inpatient Hospital Stay (HOSPITAL_BASED_OUTPATIENT_CLINIC_OR_DEPARTMENT_OTHER): Payer: Medicare Other | Admitting: Adult Health

## 2022-07-03 ENCOUNTER — Other Ambulatory Visit: Payer: Self-pay

## 2022-07-03 ENCOUNTER — Encounter: Payer: Self-pay | Admitting: Podiatry

## 2022-07-03 ENCOUNTER — Inpatient Hospital Stay: Payer: Medicare Other | Attending: Adult Health

## 2022-07-03 ENCOUNTER — Encounter: Payer: Self-pay | Admitting: Adult Health

## 2022-07-03 ENCOUNTER — Encounter: Payer: Self-pay | Admitting: Hematology and Oncology

## 2022-07-03 ENCOUNTER — Inpatient Hospital Stay: Payer: Medicare Other

## 2022-07-03 VITALS — BP 147/71 | HR 74 | Resp 19

## 2022-07-03 VITALS — BP 140/78 | HR 82 | Temp 97.5°F | Resp 20 | Ht 67.0 in | Wt 186.5 lb

## 2022-07-03 DIAGNOSIS — C50919 Malignant neoplasm of unspecified site of unspecified female breast: Secondary | ICD-10-CM | POA: Diagnosis not present

## 2022-07-03 DIAGNOSIS — Z170 Estrogen receptor positive status [ER+]: Secondary | ICD-10-CM | POA: Insufficient documentation

## 2022-07-03 DIAGNOSIS — C773 Secondary and unspecified malignant neoplasm of axilla and upper limb lymph nodes: Secondary | ICD-10-CM | POA: Diagnosis not present

## 2022-07-03 DIAGNOSIS — C7989 Secondary malignant neoplasm of other specified sites: Secondary | ICD-10-CM

## 2022-07-03 DIAGNOSIS — C50312 Malignant neoplasm of lower-inner quadrant of left female breast: Secondary | ICD-10-CM | POA: Insufficient documentation

## 2022-07-03 DIAGNOSIS — Z5112 Encounter for antineoplastic immunotherapy: Secondary | ICD-10-CM | POA: Insufficient documentation

## 2022-07-03 DIAGNOSIS — Z95828 Presence of other vascular implants and grafts: Secondary | ICD-10-CM

## 2022-07-03 LAB — CBC WITH DIFFERENTIAL (CANCER CENTER ONLY)
Abs Immature Granulocytes: 0.02 10*3/uL (ref 0.00–0.07)
Basophils Absolute: 0.1 10*3/uL (ref 0.0–0.1)
Basophils Relative: 1 %
Eosinophils Absolute: 0.3 10*3/uL (ref 0.0–0.5)
Eosinophils Relative: 5 %
HCT: 36 % (ref 36.0–46.0)
Hemoglobin: 11.5 g/dL — ABNORMAL LOW (ref 12.0–15.0)
Immature Granulocytes: 0 %
Lymphocytes Relative: 29 %
Lymphs Abs: 2 10*3/uL (ref 0.7–4.0)
MCH: 24.8 pg — ABNORMAL LOW (ref 26.0–34.0)
MCHC: 31.9 g/dL (ref 30.0–36.0)
MCV: 77.8 fL — ABNORMAL LOW (ref 80.0–100.0)
Monocytes Absolute: 0.5 10*3/uL (ref 0.1–1.0)
Monocytes Relative: 7 %
Neutro Abs: 3.9 10*3/uL (ref 1.7–7.7)
Neutrophils Relative %: 58 %
Platelet Count: 245 10*3/uL (ref 150–400)
RBC: 4.63 MIL/uL (ref 3.87–5.11)
RDW: 15.7 % — ABNORMAL HIGH (ref 11.5–15.5)
WBC Count: 6.7 10*3/uL (ref 4.0–10.5)
nRBC: 0 % (ref 0.0–0.2)

## 2022-07-03 LAB — CMP (CANCER CENTER ONLY)
ALT: 10 U/L (ref 0–44)
AST: 17 U/L (ref 15–41)
Albumin: 3.9 g/dL (ref 3.5–5.0)
Alkaline Phosphatase: 55 U/L (ref 38–126)
Anion gap: 9 (ref 5–15)
BUN: 14 mg/dL (ref 8–23)
CO2: 27 mmol/L (ref 22–32)
Calcium: 9.2 mg/dL (ref 8.9–10.3)
Chloride: 103 mmol/L (ref 98–111)
Creatinine: 0.73 mg/dL (ref 0.44–1.00)
GFR, Estimated: >60 mL/min (ref >60)
Glucose, Bld: 147 mg/dL — ABNORMAL HIGH (ref 70–99)
Potassium: 3.4 mmol/L — ABNORMAL LOW (ref 3.5–5.1)
Sodium: 139 mmol/L (ref 135–145)
Total Bilirubin: 0.5 mg/dL (ref 0.3–1.2)
Total Protein: 7.1 g/dL (ref 6.5–8.1)

## 2022-07-03 MED ORDER — SODIUM CHLORIDE 0.9 % IV SOLN: Freq: Once | INTRAVENOUS | Status: AC

## 2022-07-03 MED ORDER — SODIUM CHLORIDE 0.9 % IV SOLN
3.6000 mg/kg | Freq: Once | INTRAVENOUS | Status: AC
  Administered 2022-07-03: 320 mg via INTRAVENOUS
  Filled 2022-07-03: qty 16

## 2022-07-03 MED ORDER — SODIUM CHLORIDE 0.9% FLUSH
10.0000 mL | INTRAVENOUS | Status: DC | PRN
  Administered 2022-07-03: 10 mL

## 2022-07-03 MED ORDER — HEPARIN SOD (PORK) LOCK FLUSH 100 UNIT/ML IV SOLN
500.0000 [IU] | Freq: Once | INTRAVENOUS | Status: AC | PRN
  Administered 2022-07-03: 500 [IU]

## 2022-07-03 MED ORDER — PROCHLORPERAZINE MALEATE 10 MG PO TABS
10.0000 mg | ORAL_TABLET | Freq: Once | ORAL | Status: AC
  Administered 2022-07-03: 10 mg via ORAL
  Filled 2022-07-03: qty 1

## 2022-07-03 MED ORDER — SODIUM CHLORIDE 0.9% FLUSH
10.0000 mL | Freq: Once | INTRAVENOUS | Status: AC
  Administered 2022-07-03: 10 mL

## 2022-07-03 MED ORDER — DIPHENHYDRAMINE HCL 25 MG PO CAPS
50.0000 mg | ORAL_CAPSULE | Freq: Once | ORAL | Status: AC
  Administered 2022-07-03: 50 mg via ORAL
  Filled 2022-07-03: qty 2

## 2022-07-03 MED ORDER — ACETAMINOPHEN 325 MG PO TABS
650.0000 mg | ORAL_TABLET | Freq: Once | ORAL | Status: AC
  Administered 2022-07-03: 650 mg via ORAL
  Filled 2022-07-03: qty 2

## 2022-07-03 NOTE — Progress Notes (Signed)
Pt declined to stay for post 30 min obs, ambulatory to lobby with VSS upon discharge 

## 2022-07-03 NOTE — Progress Notes (Signed)
[facility] Follow up:    Tami Han, MD (Inactive) No address on file   DIAGNOSIS:  Cancer Staging  Chest wall recurrence of breast cancer [facility]) Staging form: Breast, AJCC 7th Edition - Clinical: Stage Unknown ([place], N1, M0) - Signed by Chauncey Cruel, MD on 07/27/2014  Malignant neoplasm of lower-inner quadrant of left breast in female, estrogen receptor positive ([place]) Staging form: Breast, AJCC 7th Edition - Clinical: Stage IA (T1b, N0, M0) - Signed by Chauncey Cruel, MD on 07/27/2014   SUMMARY OF ONCOLOGIC HISTORY: 89 y.o. [place] woman   (1) status post right lumpectomy and axillary dissection in 1993 in [place] for what appears to have been a stage I breast cancer, treated with radiation and then tamoxifen for 5 years   (2) status post right mastectomy 2002 for a recurrence in the right breast, treated adjuvantly with tamoxifen for an additional 5 years   (3) status post left mastectomy and sentinel lymph node sampling 03/07/2003 for a lower inner quadrant pT1b, pN0, stage IA mucinous breast cancer, grade 2; no radiation therapy was given. Tamoxifen was continued until 2009   RECURRENT DISEASE/ LEFT: OCTOBER 2014 (4) excisional biopsy of a left chest wall mass 01/26/2013 showing carcinoma consistent with invasive ductal carcinoma, estrogen receptor 100% positive, progesterone receptor 0% positive, with an MIB-1 of 22% and no HER-2 amplification.    (5)  PET scan on 03/03/2013 confirmed extensive metastatic adenopathy in the left axilla, left subpectoral musculature, and left supraclavicular region   (6)  started on anastrozole daily beginning 03/01/2013, interrupted during radiation therapy, resumed March 2015, discontinued March 2016 because of fatigue and arthralgias   (7) radiation therapy 03/31/2013 through 05/23/2013 Site/dose:   The patient was treated initially with a forward treatment planning technique to the left chest wall in  addition to treatment to the supraclavicular region. This consisted of a 3-D conformal technique. The patient was treated in this fashion to a dose of 50.4 gray. The patient then received a 14 gray boost treatment using an electron field. The total dose was 64.4 gray.   (8) Osteopenia, zometa yearly started 07/27/2014, but poorly tolerated.              (a) bone density scan on 12/27/13 showed a t-score of -1.8              (b) started Denosumab/Prolia April 2017, canceled after one dose per patient   (9) tamoxifen started 07/27/2014, stopped November 2019 (had total 5 years of antiestrogens)   (10) likely thalassemia trait   (11) left subpectoral soft tissue nodule noted 07/17/2015 unchanged through serial scans, most recent 82/95/6213, with no metabolic activity noted in PET scan of 2016 and interval calcification.  This is presumed benign   (12)  ADDITIONAL REGIONAL RECURRENCE:             (A) CT of the chest with contrast 03/05/2021 obtained to evaluate left upper extremity lymphedema as showed acute pulmonary emboli.  There was also left axillary and subpectoral lymphadenopathy             (B) rivaroxaban started 03/05/2021             (C) left breast ultrasound 03/29/2021 shows 2 areas in the infraclavicular tissue consistent with fat necrosis.  There were however multiple retropectoral interpectoral and left axillary masses.             (D) left axillary lymph node biopsy 04/04/2021: IDC grade  3, ER positive, 100%, strong staining intensity, PR positive, 20%, weak staining intensity, Ki-67 of 15%, HER2 positive by IHC, ratio 2.90             (E) Fulvestrant every 4 weeks beginning 04/18/2021             (F) Herceptin every 3 weeks beginning 05/30/2021             Mild progression noted on 12/12 CT chest/abd/pelvis             (G) Kadcyla every 3 weeks beginning 05/01/2022  CURRENT THERAPY: Kadcyla  INTERVAL HISTORY: Tami Schmidt 89 y.o. female returns for f/u prior to receiving Kadcyla  therapy. She is toelrating it well.  She notes she is losing weight, but tells me she is eating well and eating correct foods.      She underwent CT chest/abdomen and pelvis on 06/26/2022 that demonstrated no significant change in size of previously demonstrated left axillary and subpectoral adenopathy, subcarinal adenopathy has mildly improved.  No evidence of progressive metastatic disease.    Her most recent echocardiogram occurred on 04/28/2022 demonstrating a LVEF of 60-65%.     Patient Active Problem List   Diagnosis Date Noted   Port-A-Cath in place 06/12/2022   Loss of perception for taste 03/17/2022   Primary osteoarthritis involving multiple joints 03/17/2022   Genetic testing 07/24/2021   Microcytosis 04/29/2021   Hypokalemia 04/28/2021   SBO (small bowel obstruction) ([place]) 04/27/2021   Pulmonary embolism ([place])    Agnosia 08/09/2020   Hardening of the aorta (main artery of the heart) ([place]) 08/09/2020   Morbid obesity ([place]) 08/09/2020   Neuropathy 08/09/2020   Primary osteoarthritis 08/09/2020   Sensorineural hearing loss (SNHL) of both ears 08/07/2020   Anosmia 06/05/2020   Tinnitus, bilateral 06/05/2020   GI bleed 07/13/2019   Acute blood loss anemia 07/12/2019   Microcytic anemia 01/05/2018   Melena 01/04/2018   Lower GI bleed 01/04/2018   Type 2 diabetes mellitus without complication ([place]) [id number]   Bacteremia due to methicillin susceptible Staphylococcus aureus (MSSA) 12/02/2017   Tenosynovitis of left wrist 12/02/2017   Infection of left wrist ([place]) 11/30/2017   Primary osteoarthritis of right hip 01/05/2017   Osteoarthritis of right hip 01/03/2017   Lymphedema of upper extremity 01/17/2014   Osteopenia 01/17/2014   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   Dilated pore of [place] 08/02/2013   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   Diabetes mellitus    fasting 90-100   HTN (hypertension) 03/25/2013   Hx of radiation therapy    breasts hx   Hypercholesterolemia    Hypertension    Lymphedema of arm    left arm   Pulmonary embolism (H[facility])    Type II or unspecified type diabetes mellitus without mention of  complication, not stated as uncontrolled 03/25/2013    SURGICAL HISTORY: Past Surgical History:  Procedure Laterality Date   ABDOMINAL HYSTERECTOMY     ANKLE SURGERY Right 1995   APPENDECTOMY     BIOPSY  01/07/2018   Procedure: BIOPSY;  Surgeon: [name], MD;  Location: [facility];  Service: Gastroenterology;;   BREAST SURGERY Bilateral    mastectomy   COLONOSCOPY WITH PROPOFOL N/A 01/07/2018   Procedure: COLONOSCOPY WITH PROPOFOL;  Surgeon: [name], MD;  Location: [facility];  Service: Gastroenterology;  Laterality: N/A;   ESOPHAGOGASTRODUODENOSCOPY (EGD) WITH PROPOFOL N/A 01/06/2018   Procedure: ESOPHAGOGASTRODUODENOSCOPY (EGD) WITH PROPOFOL;  Surgeon: [name], MD;  Location: [facility];  Service: Gastroenterology;  Laterality: N/A;   HERNIA REPAIR  04-26-2010   IR IMAGING GUIDED PORT INSERTION  10/29/2021   MASS EXCISION Left 01/26/2013   Procedure: EXCISION LEFT CHEST WALL MASS AND LEFT ABDOMNAL WALL MASS;  Surgeon: Harl Bowie, MD;  Location: [facility];  Service: General;  Laterality: Left;   MASS EXCISION Left  09/20/2014   Procedure: EXCISION OF LEFT CHEST WALL MASS;  Surgeon: Coralie Keens, MD;  Location: [facility];  Service: General;  Laterality: Left;   TEE WITHOUT CARDIOVERSION N/A 12/08/2017   Procedure: TRANSESOPHAGEAL ECHOCARDIOGRAM (TEE);  Surgeon: Lelon Perla, MD;  Location: [facility];  Service: Cardiovascular;  Laterality: N/A;   TOTAL HIP ARTHROPLASTY Right 01/05/2017   TOTAL HIP ARTHROPLASTY Right 01/05/2017   Procedure: TOTAL HIP ARTHROPLASTY ANTERIOR APPROACH;  Surgeon: Frederik Pear, MD;  Location: [facility];  Service: Orthopedics;  Laterality: Right;    SOCIAL HISTORY: Social History   Socioeconomic History   Marital status: Married    Spouse name: Not on file   Number of children: Not on file   Years of education: Not on file   Highest education level: Not on file  Occupational History   Occupation: retired Education officer, museum  Tobacco Use   Smoking status: Former    Types: Cigarettes    Quit date: 04/14/1972    Years since quitting: 50.2   Smokeless tobacco: Never  Vaping Use   Vaping Use: Never used  Substance and Sexual Activity   Alcohol use: Yes    Comment: occasional   Drug use: Not Currently   Sexual activity: Yes    Birth control/protection: Surgical  Other Topics Concern   Not on file  Social History Narrative   Not on file   Social Determinants of Health   Financial Resource Strain: Not on file  Food Insecurity: Not on file  Transportation Needs: Not on file  Physical Activity: Not on file  Stress: Not on file  Social Connections: Not on file  Intimate Partner Violence: Not on file    FAMILY HISTORY: Family History  Problem Relation Age of Onset   Breast cancer Cousin        maternal first cousin   Uterine cancer Cousin        maternal first cousin    Review of Systems  Constitutional:  Negative for appetite change, chills, fatigue, fever and unexpected weight change.  HENT:   Negative for hearing loss, lump/mass and trouble  swallowing.   Eyes:  Negative for eye problems and icterus.  Respiratory:  Negative for chest tightness, cough and shortness of breath.   Cardiovascular:  Negative for chest pain, leg swelling and palpitations.  Gastrointestinal:  Negative for abdominal distention, abdominal pain, constipation, diarrhea, nausea and vomiting.  Endocrine: Negative for hot flashes.  Genitourinary:  Negative for difficulty urinating.   Musculoskeletal:  Negative for arthralgias.  Skin:  Negative for itching and rash.  Neurological:  Negative for dizziness, extremity weakness, headaches and numbness.  Hematological:  Negative for adenopathy. Does not bruise/bleed easily.  Psychiatric/Behavioral:  Negative for depression. The patient is not nervous/anxious.       PHYSICAL EXAMINATION  ECOG PERFORMANCE STATUS: 1 - Symptomatic but completely ambulatory  Vitals:   07/03/22 0843  BP: (!) 167/72  Pulse: 82  Resp: 20  Temp: (!) 97.5 F (36.4 C)  SpO2: 100%    Physical Exam Constitutional:      General: She is not in acute distress.    Appearance: Normal appearance. She is not toxic-appearing.  HENT:     Head: Normocephalic and atraumatic.  Eyes:     General: No scleral icterus. Cardiovascular:     Rate and Rhythm: Normal rate and regular rhythm.     Pulses: Normal pulses.     Heart sounds: Normal heart sounds.  Pulmonary:     Effort: Pulmonary effort is normal.     Breath sounds: Normal breath sounds.  Abdominal:     General: Abdomen is flat. Bowel sounds are normal. There is no distension.     Palpations: Abdomen is soft.     Tenderness: There is no abdominal tenderness.  Musculoskeletal:        General: No swelling.     Cervical back: Neck supple.  Lymphadenopathy:     Cervical: No cervical adenopathy.  Skin:    General: Skin is warm and dry.     Findings: No rash.  Neurological:     General: No focal deficit present.     Mental Status: She is alert.  Psychiatric:        Mood and  Affect: Mood normal.        Behavior: Behavior normal.     LABORATORY DATA:  CBC    Component Value Date/Time   WBC 6.7 07/03/2022 0820   WBC 6.3 12/10/2021 1322   RBC 4.63 07/03/2022 0820   HGB 11.5 (L) 07/03/2022 0820   HGB 10.5 (L) 01/20/2017 1057   HCT 36.0 07/03/2022 0820   HCT 25.8 (L) 01/05/2018 0513   HCT 32.8 (L) 01/20/2017 1057   PLT 245 07/03/2022 0820   PLT 368 01/20/2017 1057   MCV 77.8 (L) 07/03/2022 0820   MCV 77.8 (L) 01/20/2017 1057   MCH 24.8 (L) 07/03/2022 0820   MCHC 31.9 07/03/2022 0820   RDW 15.7 (H) 07/03/2022 0820   RDW 16.0 (H) 01/20/2017 1057   LYMPHSABS 2.0 07/03/2022 0820   LYMPHSABS 1.3 01/20/2017 1057   MONOABS 0.5 07/03/2022 0820   MONOABS 0.4 01/20/2017 1057   EOSABS 0.3 07/03/2022 0820   EOSABS 0.1 01/20/2017 1057   BASOSABS 0.1 07/03/2022 0820   BASOSABS 0.1 01/20/2017 1057    CMP     Component Value Date/Time   NA 139 07/03/2022 0820   NA 139 01/20/2017 1057   K 3.4 (L) 07/03/2022 0820   K 3.9 01/20/2017 1057   CL 103 07/03/2022 0820   CO2 27 07/03/2022 0820   CO2 25 01/20/2017 1057   GLUCOSE 147 (H) 07/03/2022 0820   GLUCOSE 135 01/20/2017 1057   BUN 14 07/03/2022 0820   BUN 15.6 01/20/2017 1057   CREATININE 0.73 07/03/2022 0820   CREATININE 0.7 01/20/2017 1057   CALCIUM 9.2 07/03/2022 0820   CALCIUM 9.3 01/20/2017 1057   PROT 7.1 07/03/2022 0820  PROT 7.1 01/20/2017 1057   ALBUMIN 3.9 07/03/2022 0820   ALBUMIN 3.4 (L) 01/20/2017 1057   AST 17 07/03/2022 0820   AST 10 01/20/2017 1057   ALT 10 07/03/2022 0820   ALT 14 01/20/2017 1057   ALKPHOS 55 07/03/2022 0820   ALKPHOS 69 01/20/2017 1057   BILITOT 0.5 07/03/2022 0820   BILITOT 0.43 01/20/2017 1057   GFRNONAA >60 07/03/2022 0820   GFRAA >60 07/14/2019 0538       ASSESSMENT and THERAPY PLAN:   Chest wall recurrence of breast cancer (HCC) Tami Schmidt is an 89 year old woman with recurrent breast cancer that was first noted in November 2022.  It was  biopsy ER positive PR positive and HER2 positive.    She was started on Kadcyla January 18 and continues on this every 3 weeks.    She continues to tolerate the Kadcyla well and will continue with this.  Recent restaging was reviewed with the patient shows slight improvement.  We reviewed the images on the computer.  We discussed continuing on treatment for 4 more cycles and restaging.  I sent future cycles to Dr. Chryl Heck to sign and placed imaging orders for May 27 with f/u to review results a few days later.   Echo repeat due in April; orders placed today.  She will return in 3 weeks for labs, follow-up, and her next Kadcyla.    All questions were answered. The patient knows to call the clinic with any problems, questions or concerns. We can certainly see the patient much sooner if necessary.  Total encounter time:30 minutes*in face-to-face visit time, chart review, lab review, care coordination, order entry, and documentation of the encounter time.    Wilber Bihari, NP 07/03/22 9:42 AM Medical Oncology and Hematology [facility] [place], [place] [address] Tel. [phone]    Fax. [phone]  *Total Encounter Time as defined by the Centers for Medicare and Medicaid Services includes, in addition to the face-to-face time of a patient visit (documented in the note above) non-face-to-face time: obtaining and reviewing outside history, ordering and reviewing medications, tests or procedures, care coordination (communications with other health care professionals or caregivers) and documentation in the medical record.

## 2022-07-03 NOTE — Assessment & Plan Note (Addendum)
Tami Schmidt is an 89 year old woman with recurrent breast cancer that was first noted in November 2022.  It was biopsy ER positive PR positive and HER2 positive.    She was started on Kadcyla January 18 and continues on this every 3 weeks.    She continues to tolerate the Kadcyla well and will continue with this.  Recent restaging was reviewed with the patient shows slight improvement.  We reviewed the images on the computer.  We discussed continuing on treatment for 4 more cycles and restaging.  I sent future cycles to Dr. Chryl Heck to sign and placed imaging orders for May 27 with f/u to review results a few days later.   Echo repeat due in April; orders placed today.  She will return in 3 weeks for labs, follow-up, and her next Kadcyla.

## 2022-07-03 NOTE — Patient Instructions (Signed)
[facility]  Discharge Instructions: Thank you for choosing [facility] to provide your oncology and hematology care.   If you have a lab appointment with the Cancer Center, please go directly to the Cancer Center and check in at the registration area.   Wear comfortable clothing and clothing appropriate for easy access to any Portacath or PICC line.   We strive to give you quality time with your provider. You may need to reschedule your appointment if you arrive late (15 or more minutes).  Arriving late affects you and other patients whose appointments are after yours.  Also, if you miss three or more appointments without notifying the office, you may be dismissed from the clinic at the provider's discretion.      For prescription refill requests, have your pharmacy contact our office and allow 72 hours for refills to be completed.    Today you received the following chemotherapy and/or immunotherapy agents: Kadcyla.       To help prevent nausea and vomiting after your treatment, we encourage you to take your nausea medication as directed.  BELOW ARE SYMPTOMS THAT SHOULD BE REPORTED IMMEDIATELY: *FEVER GREATER THAN 100.4 F (38 C) OR HIGHER *CHILLS OR SWEATING *NAUSEA AND VOMITING THAT IS NOT CONTROLLED WITH YOUR NAUSEA MEDICATION *UNUSUAL SHORTNESS OF BREATH *UNUSUAL BRUISING OR BLEEDING *URINARY PROBLEMS (pain or burning when urinating, or frequent urination) *BOWEL PROBLEMS (unusual diarrhea, constipation, pain near the anus) TENDERNESS IN MOUTH AND THROAT WITH OR WITHOUT PRESENCE OF ULCERS (sore throat, sores in mouth, or a toothache) UNUSUAL RASH, SWELLING OR PAIN  UNUSUAL VAGINAL DISCHARGE OR ITCHING   Items with * indicate a potential emergency and should be followed up as soon as possible or go to the Emergency Department if any problems should occur.  Please show the CHEMOTHERAPY ALERT CARD or IMMUNOTHERAPY ALERT CARD at  check-in to the Emergency Department and triage nurse.  Should you have questions after your visit or need to cancel or reschedule your appointment, please contact [facility]  Dept: [phone]  and follow the prompts.  Office hours are 8:00 a.m. to 4:30 p.m. Monday - Friday. Please note that voicemails left after 4:00 p.m. may not be returned until the following business day.  We are closed weekends and major holidays. You have access to a nurse at all times for urgent questions. Please call the main number to the clinic Dept: [phone] and follow the prompts.   For any non-urgent questions, you may also contact your provider using MyChart. We now offer e-Visits for anyone 18 and older to request care online for non-urgent symptoms. For details visit [email or web address].   Also download the MyChart app! Go to the app store, search "MyChart", open the app, select [facility], and log in with your MyChart username and password.   

## 2022-07-04 LAB — CANCER ANTIGEN 27.29: CA 27.29: 37.3 U/mL (ref 0.0–38.6)

## 2022-07-08 ENCOUNTER — Telehealth: Payer: Self-pay | Admitting: Adult Health

## 2022-07-08 NOTE — Telephone Encounter (Signed)
Scheduled appointments per WQ. Left voicemail. 

## 2022-07-15 ENCOUNTER — Other Ambulatory Visit: Payer: Self-pay

## 2022-07-18 ENCOUNTER — Ambulatory Visit (HOSPITAL_COMMUNITY)
Admission: RE | Admit: 2022-07-18 | Discharge: 2022-07-18 | Disposition: A | Discharge status: Home / Self Care | Payer: Medicare Other | Source: Ambulatory Visit | Attending: Adult Health | Admitting: Adult Health

## 2022-07-18 DIAGNOSIS — C7989 Secondary malignant neoplasm of other specified sites: Secondary | ICD-10-CM | POA: Diagnosis not present

## 2022-07-18 DIAGNOSIS — I340 Nonrheumatic mitral (valve) insufficiency: Secondary | ICD-10-CM | POA: Insufficient documentation

## 2022-07-18 DIAGNOSIS — Z170 Estrogen receptor positive status [ER+]: Secondary | ICD-10-CM | POA: Diagnosis not present

## 2022-07-18 DIAGNOSIS — I119 Hypertensive heart disease without heart failure: Secondary | ICD-10-CM | POA: Insufficient documentation

## 2022-07-18 DIAGNOSIS — E119 Type 2 diabetes mellitus without complications: Secondary | ICD-10-CM | POA: Diagnosis not present

## 2022-07-18 DIAGNOSIS — Z0189 Encounter for other specified special examinations: Secondary | ICD-10-CM | POA: Diagnosis not present

## 2022-07-18 DIAGNOSIS — C50919 Malignant neoplasm of unspecified site of unspecified female breast: Secondary | ICD-10-CM | POA: Diagnosis not present

## 2022-07-18 DIAGNOSIS — Z5181 Encounter for therapeutic drug level monitoring: Secondary | ICD-10-CM | POA: Diagnosis present

## 2022-07-18 DIAGNOSIS — C50312 Malignant neoplasm of lower-inner quadrant of left female breast: Secondary | ICD-10-CM | POA: Diagnosis not present

## 2022-07-18 LAB — ECHOCARDIOGRAM COMPLETE
Area-P 1/2: 3.68 cm2
S' Lateral: 2.8 cm

## 2022-07-18 NOTE — Progress Notes (Signed)
  Echocardiogram 2D Echocardiogram has been performed.  Tami Schmidt 07/18/2022, 12:03 PM

## 2022-07-24 ENCOUNTER — Inpatient Hospital Stay (HOSPITAL_BASED_OUTPATIENT_CLINIC_OR_DEPARTMENT_OTHER): Payer: Medicare Other | Admitting: Hematology and Oncology

## 2022-07-24 ENCOUNTER — Inpatient Hospital Stay: Payer: Medicare Other

## 2022-07-24 ENCOUNTER — Other Ambulatory Visit: Payer: Self-pay

## 2022-07-24 ENCOUNTER — Inpatient Hospital Stay: Payer: Medicare Other | Attending: Adult Health

## 2022-07-24 ENCOUNTER — Encounter: Payer: Self-pay | Admitting: *Deleted

## 2022-07-24 VITALS — BP 130/74 | HR 64 | Resp 16

## 2022-07-24 DIAGNOSIS — Z95828 Presence of other vascular implants and grafts: Secondary | ICD-10-CM

## 2022-07-24 DIAGNOSIS — Z5112 Encounter for antineoplastic immunotherapy: Secondary | ICD-10-CM | POA: Insufficient documentation

## 2022-07-24 DIAGNOSIS — Z170 Estrogen receptor positive status [ER+]: Secondary | ICD-10-CM

## 2022-07-24 DIAGNOSIS — C50312 Malignant neoplasm of lower-inner quadrant of left female breast: Secondary | ICD-10-CM

## 2022-07-24 DIAGNOSIS — C50919 Malignant neoplasm of unspecified site of unspecified female breast: Secondary | ICD-10-CM

## 2022-07-24 LAB — CMP (CANCER CENTER ONLY)
ALT: 9 U/L (ref 0–44)
AST: 16 U/L (ref 15–41)
Albumin: 3.9 g/dL (ref 3.5–5.0)
Alkaline Phosphatase: 56 U/L (ref 38–126)
Anion gap: 7 (ref 5–15)
BUN: 19 mg/dL (ref 8–23)
CO2: 28 mmol/L (ref 22–32)
Calcium: 9.5 mg/dL (ref 8.9–10.3)
Chloride: 103 mmol/L (ref 98–111)
Creatinine: 0.75 mg/dL (ref 0.44–1.00)
GFR, Estimated: >60 mL/min (ref >60)
Glucose, Bld: 127 mg/dL — ABNORMAL HIGH (ref 70–99)
Potassium: 3.7 mmol/L (ref 3.5–5.1)
Sodium: 138 mmol/L (ref 135–145)
Total Bilirubin: 0.4 mg/dL (ref 0.3–1.2)
Total Protein: 7.4 g/dL (ref 6.5–8.1)

## 2022-07-24 LAB — CBC WITH DIFFERENTIAL (CANCER CENTER ONLY)
Abs Immature Granulocytes: 0.01 10*3/uL (ref 0.00–0.07)
Basophils Absolute: 0.1 10*3/uL (ref 0.0–0.1)
Basophils Relative: 1 %
Eosinophils Absolute: 0.3 10*3/uL (ref 0.0–0.5)
Eosinophils Relative: 5 %
HCT: 34.7 % — ABNORMAL LOW (ref 36.0–46.0)
Hemoglobin: 11.2 g/dL — ABNORMAL LOW (ref 12.0–15.0)
Immature Granulocytes: 0 %
Lymphocytes Relative: 27 %
Lymphs Abs: 1.8 10*3/uL (ref 0.7–4.0)
MCH: 24.8 pg — ABNORMAL LOW (ref 26.0–34.0)
MCHC: 32.3 g/dL (ref 30.0–36.0)
MCV: 76.8 fL — ABNORMAL LOW (ref 80.0–100.0)
Monocytes Absolute: 0.4 10*3/uL (ref 0.1–1.0)
Monocytes Relative: 7 %
Neutro Abs: 3.9 10*3/uL (ref 1.7–7.7)
Neutrophils Relative %: 60 %
Platelet Count: 248 10*3/uL (ref 150–400)
RBC: 4.52 MIL/uL (ref 3.87–5.11)
RDW: 16.4 % — ABNORMAL HIGH (ref 11.5–15.5)
WBC Count: 6.5 10*3/uL (ref 4.0–10.5)
nRBC: 0 % (ref 0.0–0.2)

## 2022-07-24 MED ORDER — SODIUM CHLORIDE 0.9 % IV SOLN
3.6000 mg/kg | Freq: Once | INTRAVENOUS | Status: AC
  Administered 2022-07-24: 320 mg via INTRAVENOUS
  Filled 2022-07-24: qty 16

## 2022-07-24 MED ORDER — SODIUM CHLORIDE 0.9% FLUSH
10.0000 mL | INTRAVENOUS | Status: DC | PRN
  Administered 2022-07-24: 10 mL

## 2022-07-24 MED ORDER — DIPHENHYDRAMINE HCL 25 MG PO CAPS
50.0000 mg | ORAL_CAPSULE | Freq: Once | ORAL | Status: AC
  Administered 2022-07-24: 50 mg via ORAL
  Filled 2022-07-24: qty 2

## 2022-07-24 MED ORDER — HEPARIN SOD (PORK) LOCK FLUSH 100 UNIT/ML IV SOLN
500.0000 [IU] | Freq: Once | INTRAVENOUS | Status: AC | PRN
  Administered 2022-07-24: 500 [IU]

## 2022-07-24 MED ORDER — ACETAMINOPHEN 325 MG PO TABS
650.0000 mg | ORAL_TABLET | Freq: Once | ORAL | Status: AC
  Administered 2022-07-24: 650 mg via ORAL
  Filled 2022-07-24: qty 2

## 2022-07-24 MED ORDER — SODIUM CHLORIDE 0.9 % IV SOLN: Freq: Once | INTRAVENOUS | Status: AC

## 2022-07-24 MED ORDER — PROCHLORPERAZINE MALEATE 10 MG PO TABS
10.0000 mg | ORAL_TABLET | Freq: Once | ORAL | Status: AC
  Administered 2022-07-24: 10 mg via ORAL
  Filled 2022-07-24: qty 1

## 2022-07-24 MED ORDER — SODIUM CHLORIDE 0.9% FLUSH
10.0000 mL | Freq: Once | INTRAVENOUS | Status: AC
  Administered 2022-07-24: 10 mL

## 2022-07-24 NOTE — Progress Notes (Signed)
[facility]    Patient Care Team: Margot Ables, MD (Inactive) as PCP - General (Family Medicine) Otho Darner, MD as Consulting Physician (Family Medicine) Donnelly Angelica, RN as Oncology Nurse Navigator Pershing Proud, RN as Oncology Nurse Navigator Rachel Moulds, MD as Consulting Physician (Hematology and Oncology)    Name of the patient: Tami Schmidt  [id number]  1933/02/26   Date of visit: 07/24/2022   Chief Complaint/Reason for visit: toxicity check  Current Therapy: Kadcyla  ASSESSMENT & PLAN:  Patient is a 89 y.o. 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 Al Pimple.  I have viewed most recent oncology note and lab work.   #)Estrogen positive progesterone positive and HER2 positive breast cancer recurrence diagnosed in November 2022.  - Labs including CBC and CMP are acceptable for treatment today. -Last staging with stable to mildly improved disease.  She will continue Kadcyla until toxicity or progression.  At this time she has mild fatigue and very mild neuropathy and she is reluctant to decrease her dose hard to interrupt Kadcyla.  She wants to continue it.  No concerns on physical examination today.  #) Symptom management: Fatigue -Patient has ongoing fatigue from her current treatment. It is manageable.    Heme/Onc History: Oncology History  Malignant neoplasm of lower-inner quadrant of left breast in female, estrogen receptor positive  06/16/2013 Initial Diagnosis   Malignant neoplasm of lower-inner quadrant of left breast in female, estrogen receptor positive (HCC)   05/30/2021 - 04/03/2022 Chemotherapy   Patient is on Treatment Plan : BREAST Trastuzumab q28d      Genetic Testing   [facility] CancerNext-Expanded Panel was Negative. 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 Report date is  07/22/2021.  The CancerNext-Expanded gene panel offered by [facility] and includes sequencing, rearrangement, and RNA analysis for the following 77 genes: AIP, ALK, APC, ATM, AXIN2, BAP1, BARD1, BLM, BMPR1A, BRCA1, BRCA2, BRIP1, CDC73, CDH1, CDK4, CDKN1B, CDKN2A, CHEK2, CTNNA1, DICER1, FANCC, FH, FLCN, GALNT12, KIF1B, LZTR1, MAX, MEN1, MET, MLH1, MSH2, MSH3, MSH6, MUTYH, NBN, NF1, NF2, NTHL1, PALB2, PHOX2B, PMS2, POT1, PRKAR1A, PTCH1, PTEN, RAD51C, RAD51D, RB1, RECQL, RET, SDHA, SDHAF2, SDHB, SDHC, SDHD, SMAD4, SMARCA4, SMARCB1, SMARCE1, STK11, SUFU, TMEM127, TP53, TSC1, TSC2, VHL and XRCC2 (sequencing and deletion/duplication); EGFR, EGLN1, HOXB13, KIT, MITF, PDGFRA, POLD1, and POLE (sequencing only); EPCAM and GREM1 (deletion/duplication only).    05/01/2022 -  Chemotherapy   Patient is on Treatment Plan : BREAST ADO-Trastuzumab Emtansine (Kadcyla) q21d      Interval history-: Tami Schmidt is a 89 y.o. female with oncologic history as above seen today for toxicity check prior to Kadcyla.  She once again does not complain of anything except for some fatigue.  She has noticed very mild numbness in the tips of her fingers but this does not bother her from doing anything.  She denies any other complaints today.  Rest of the pertinent 10 point ROS reviewed and negative  Allergies  Allergen Reactions   Bactrim Swelling    SWELLING OF MOUTH/FACE.   Lisinopril Swelling    SWELLING OF MOUTH/FACE.   Vasotec Swelling    SWELLING OF MOUTH/FACE.   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   Diabetes mellitus    fasting 90-100   HTN (hypertension) 03/25/2013   Hx of radiation therapy    breasts hx   Hypercholesterolemia    Hypertension    Lymphedema of arm    left arm   Pulmonary embolism  (HCC)    Type II or unspecified type diabetes mellitus without mention of complication, not stated as uncontrolled 03/25/2013     Past Surgical History:  Procedure Laterality Date   ABDOMINAL HYSTERECTOMY     ANKLE SURGERY Right 1995   APPENDECTOMY     BIOPSY  01/07/2018   Procedure: BIOPSY;  Surgeon: Kerin SalenKarki, Arya, MD;  Location: [facility];  Service: Gastroenterology;;   BREAST SURGERY Bilateral    mastectomy   COLONOSCOPY WITH PROPOFOL N/A 01/07/2018   Procedure: COLONOSCOPY WITH PROPOFOL;  Surgeon: Kerin SalenKarki, Arya, MD;  Location: [facility];  Service: Gastroenterology;  Laterality: N/A;   ESOPHAGOGASTRODUODENOSCOPY (EGD) WITH PROPOFOL N/A 01/06/2018   Procedure: ESOPHAGOGASTRODUODENOSCOPY (EGD) WITH PROPOFOL;  Surgeon: Kerin SalenKarki, Arya, MD;  Location: [facility];  Service: Gastroenterology;  Laterality: N/A;   HERNIA REPAIR  04-26-2010   IR IMAGING GUIDED PORT INSERTION  10/29/2021   MASS EXCISION Left 01/26/2013   Procedure: EXCISION LEFT CHEST WALL MASS AND LEFT ABDOMNAL WALL MASS;  Surgeon: Shelly Rubensteinouglas A Blackman, MD;  Location: [facility];  Service: General;  Laterality: Left;   MASS EXCISION Left 09/20/2014   Procedure: EXCISION OF LEFT CHEST WALL MASS;  Surgeon: Abigail Miyamotoouglas Blackman, MD;  Location: [facility];  Service: General;  Laterality: Left;   TEE WITHOUT CARDIOVERSION N/A 12/08/2017   Procedure: TRANSESOPHAGEAL ECHOCARDIOGRAM (TEE);  Surgeon: Lewayne Buntingrenshaw, Brian S, MD;  [facility];  Service: Cardiovascular;  Laterality: N/A;   TOTAL HIP ARTHROPLASTY Right 01/05/2017   TOTAL HIP ARTHROPLASTY Right 01/05/2017   Procedure: TOTAL HIP ARTHROPLASTY ANTERIOR APPROACH;  Surgeon: Gean Birchwoodowan, Frank, MD;  Location: [facility];  Service: Orthopedics;  Laterality: Right;    Social History   Socioeconomic History   Marital status: Married    Spouse name: Not on file   Number of children: Not on file   Years of education: Not on file   Highest education level: Not on file  Occupational  History   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  Tobacco Use   Smoking status: Former    Types: Cigarettes    Quit date: 04/14/1972    Years since quitting: 50.3   Smokeless tobacco: Never  Vaping Use   Vaping Use: Never used  Substance and Sexual Activity   Alcohol use: Yes    Comment: occasional   Drug use: Not Currently   Sexual activity: Yes    Birth control/protection: Surgical  Other Topics Concern   Not on file  Social History Narrative   Not on file   Social Determinants of Health   Financial Resource Strain: Not on file  Food Insecurity: Not on file  Transportation Needs: Not on file  Physical Activity: Not on file  Stress: Not on file  Social Connections: Not on file  Intimate Partner Violence: Not on file    Family History  Problem Relation Age of Onset   Breast cancer Cousin        maternal first cousin   Uterine cancer Cousin        maternal first cousin     Current Outpatient Medications:    amLODipine (NORVASC) 10 MG tablet, Take 10  mg by mouth daily., Disp: , Rfl:    apixaban (ELIQUIS) 5 MG TABS tablet, Take 1 tablet (5 mg total) by mouth 2 (two) times daily., Disp: 180 tablet, Rfl: 3   lidocaine-prilocaine (EMLA) cream, Apply to affected area once (Patient not taking: Reported on 07/03/2022), Disp: 30 g, Rfl: 3   metFORMIN (GLUCOPHAGE) 500 MG tablet, Take 500 mg by mouth daily with breakfast., Disp: , Rfl:    olmesartan-hydrochlorothiazide (BENICAR HCT) 40-12.5 MG tablet, Take 1 tablet by mouth daily., Disp: , Rfl:    ondansetron (ZOFRAN) 8 MG tablet, Take 1 tablet (8 mg total) by mouth every 8 (eight) hours as needed for nausea or vomiting. (Patient not taking: Reported on 07/03/2022), Disp: 30 tablet, Rfl: 1   prochlorperazine (COMPAZINE) 10 MG tablet, Take 1 tablet (10 mg total) by mouth every 6 (six) hours as needed for nausea or vomiting. (Patient not taking: Reported on 07/03/2022), Disp: 30 tablet, Rfl: 1   SM IRON 325 (65 Fe) MG tablet, Take 325 mg by  mouth daily., Disp: , Rfl:    Turmeric (QC TUMERIC COMPLEX PO), Take 1 tablet by mouth daily., Disp: , Rfl:    TYLENOL 325 MG tablet, Take 325 mg by mouth daily., Disp: , Rfl:    VITAMIN D PO, Take 1 tablet by mouth daily., Disp: , Rfl:   PHYSICAL EXAM: ECOG FS:1 - Symptomatic but completely ambulatory    Vitals:   07/24/22 1222  BP: (!) 161/51  Pulse: 79  Resp: 16  Temp: 97.8 F (36.6 C)  TempSrc: Temporal  SpO2: 100%  Weight: 186 lb 12.8 oz (84.7 kg)  Height: 5\' 7"  (1.702 m)    Physical Exam Vitals and nursing note reviewed.  Constitutional:      Appearance: She is well-developed. She is not ill-appearing or toxic-appearing.  HENT:     Head: Normocephalic.     Nose: Nose normal.  Eyes:     Conjunctiva/sclera: Conjunctivae normal.  Neck:     Vascular: No JVD.  Cardiovascular:     Rate and Rhythm: Normal rate and regular rhythm.     Pulses:          Radial pulses are 2+ on the right side and 2+ on the left side.     Heart sounds: Normal heart sounds.  Pulmonary:     Effort: Pulmonary effort is normal.     Breath sounds: Normal breath sounds.  Chest:     Comments: Left axillary adenopathy improving Abdominal:     General: There is no distension.  Musculoskeletal:     Cervical back: Normal range of motion.     Comments: LUE lymphedema  Skin:    General: Skin is warm and dry.     Capillary Refill: Capillary refill takes less than 2 seconds.  Neurological:     Mental Status: She is oriented to person, place, and time.        LABORATORY DATA: I have reviewed the data as listed    Latest Ref Rng & Units 07/24/2022   11:37 AM 07/03/2022    8:20 AM 06/12/2022   12:17 PM  CBC  WBC 4.0 - 10.5 K/uL 6.5  6.7  6.3   Hemoglobin 12.0 - 15.0 g/dL 63.0  16.0  10.9   Hematocrit 36.0 - 46.0 % 34.7  36.0  36.3   Platelets 150 - 400 K/uL 248  245  235         Latest Ref Rng & Units 07/03/2022  8:20 AM 06/12/2022   12:17 PM 05/22/2022    2:03 PM  CMP  Glucose 70 -  99 mg/dL 161  096  91   BUN 8 - 23 mg/dL Creatinine 0.44 - 1.00 mg/dL 0.45  4.09  8.11   Sodium 135 - 145 mmol/L 139  137  139   Potassium 3.5 - 5.1 mmol/L 3.4  3.8  4.0   Chloride 98 - 111 mmol/L 103  101  103   CO2 22 - 32 mmol/L Calcium 8.9 - 10.3 mg/dL 9.2  8.7  9.5   Total Protein 6.5 - 8.1 g/dL 7.1  6.6  6.6   Total Bilirubin 0.3 - 1.2 mg/dL 0.5  0.5  0.5   Alkaline Phos 38 - 126 U/L 55  57  54   AST 15 - 41 U/L ALT 0 - 44 U/L RADIOGRAPHIC STUDIES (from last 24 hours if applicable) I have personally reviewed the radiological images as listed and agreed with the findings in the report. No results found.    Visit Diagnosis: 1. Malignant neoplasm of lower-inner quadrant of left breast in female, estrogen receptor positive       No orders of the defined types were placed in this encounter.   All questions were answered. The patient knows to call the clinic with any problems, questions or concerns. No barriers to learning was detected.  I have spent a total of 30 minutes minutes of face-to-face and non-face-to-face time, preparing to see the patient, obtaining and/or reviewing separately obtained history, performing a medically appropriate examination, counseling and educating the patient, ordering tests, documenting clinical information in the electronic health record, and care coordination (communications with other health care professionals or caregivers).   Thank you for allowing me to participate in the care of this patient.    Rachel Moulds, MD Department of Hematology/Oncology [facility] Phone: [phone]  [phone]

## 2022-07-24 NOTE — Patient Instructions (Signed)
[facility]  Discharge Instructions: Thank you for choosing [facility] to provide your oncology and hematology care.   If you have a lab appointment with the Cancer Center, please go directly to the Cancer Center and check in at the registration area.   Wear comfortable clothing and clothing appropriate for easy access to any Portacath or PICC line.   We strive to give you quality time with your provider. You may need to reschedule your appointment if you arrive late (15 or more minutes).  Arriving late affects you and other patients whose appointments are after yours.  Also, if you miss three or more appointments without notifying the office, you may be dismissed from the clinic at the provider's discretion.      For prescription refill requests, have your pharmacy contact our office and allow 72 hours for refills to be completed.    Today you received the following chemotherapy and/or immunotherapy agents: Kadcyla.       To help prevent nausea and vomiting after your treatment, we encourage you to take your nausea medication as directed.  BELOW ARE SYMPTOMS THAT SHOULD BE REPORTED IMMEDIATELY: *FEVER GREATER THAN 100.4 F (38 C) OR HIGHER *CHILLS OR SWEATING *NAUSEA AND VOMITING THAT IS NOT CONTROLLED WITH YOUR NAUSEA MEDICATION *UNUSUAL SHORTNESS OF BREATH *UNUSUAL BRUISING OR BLEEDING *URINARY PROBLEMS (pain or burning when urinating, or frequent urination) *BOWEL PROBLEMS (unusual diarrhea, constipation, pain near the anus) TENDERNESS IN MOUTH AND THROAT WITH OR WITHOUT PRESENCE OF ULCERS (sore throat, sores in mouth, or a toothache) UNUSUAL RASH, SWELLING OR PAIN  UNUSUAL VAGINAL DISCHARGE OR ITCHING   Items with * indicate a potential emergency and should be followed up as soon as possible or go to the Emergency Department if any problems should occur.  Please show the CHEMOTHERAPY ALERT CARD or IMMUNOTHERAPY ALERT CARD at  check-in to the Emergency Department and triage nurse.  Should you have questions after your visit or need to cancel or reschedule your appointment, please contact [facility]  Dept: [phone]  and follow the prompts.  Office hours are 8:00 a.m. to 4:30 p.m. Monday - Friday. Please note that voicemails left after 4:00 p.m. may not be returned until the following business day.  We are closed weekends and major holidays. You have access to a nurse at all times for urgent questions. Please call the main number to the clinic Dept: [phone] and follow the prompts.   For any non-urgent questions, you may also contact your provider using MyChart. We now offer e-Visits for anyone 18 and older to request care online for non-urgent symptoms. For details visit [email or web address].   Also download the MyChart app! Go to the app store, search "MyChart", open the app, select [facility], and log in with your MyChart username and password.   

## 2022-07-25 LAB — CANCER ANTIGEN 27.29: CA 27.29: 30.7 U/mL (ref 0.0–38.6)

## 2022-08-14 ENCOUNTER — Inpatient Hospital Stay: Payer: Medicare Other

## 2022-08-14 ENCOUNTER — Other Ambulatory Visit: Payer: Self-pay

## 2022-08-14 ENCOUNTER — Inpatient Hospital Stay (HOSPITAL_BASED_OUTPATIENT_CLINIC_OR_DEPARTMENT_OTHER): Payer: Medicare Other | Admitting: Hematology and Oncology

## 2022-08-14 ENCOUNTER — Inpatient Hospital Stay: Payer: Medicare Other | Attending: Adult Health

## 2022-08-14 VITALS — BP 159/71 | HR 81

## 2022-08-14 VITALS — BP 163/54 | HR 98 | Temp 97.0°F | Resp 16 | Wt 189.0 lb

## 2022-08-14 DIAGNOSIS — Z5112 Encounter for antineoplastic immunotherapy: Secondary | ICD-10-CM | POA: Insufficient documentation

## 2022-08-14 DIAGNOSIS — C50312 Malignant neoplasm of lower-inner quadrant of left female breast: Secondary | ICD-10-CM

## 2022-08-14 DIAGNOSIS — Z794 Long term (current) use of insulin: Secondary | ICD-10-CM | POA: Insufficient documentation

## 2022-08-14 DIAGNOSIS — Z170 Estrogen receptor positive status [ER+]: Secondary | ICD-10-CM

## 2022-08-14 DIAGNOSIS — E119 Type 2 diabetes mellitus without complications: Secondary | ICD-10-CM | POA: Diagnosis not present

## 2022-08-14 LAB — CBC WITH DIFFERENTIAL (CANCER CENTER ONLY)
Abs Immature Granulocytes: 0.02 10*3/uL (ref 0.00–0.07)
Basophils Absolute: 0 10*3/uL (ref 0.0–0.1)
Basophils Relative: 1 %
Eosinophils Absolute: 0.3 10*3/uL (ref 0.0–0.5)
Eosinophils Relative: 5 %
HCT: 35.2 % — ABNORMAL LOW (ref 36.0–46.0)
Hemoglobin: 11.1 g/dL — ABNORMAL LOW (ref 12.0–15.0)
Immature Granulocytes: 0 %
Lymphocytes Relative: 31 %
Lymphs Abs: 1.7 10*3/uL (ref 0.7–4.0)
MCH: 24.5 pg — ABNORMAL LOW (ref 26.0–34.0)
MCHC: 31.5 g/dL (ref 30.0–36.0)
MCV: 77.7 fL — ABNORMAL LOW (ref 80.0–100.0)
Monocytes Absolute: 0.4 10*3/uL (ref 0.1–1.0)
Monocytes Relative: 8 %
Neutro Abs: 3.1 10*3/uL (ref 1.7–7.7)
Neutrophils Relative %: 55 %
Platelet Count: 205 10*3/uL (ref 150–400)
RBC: 4.53 MIL/uL (ref 3.87–5.11)
RDW: 17 % — ABNORMAL HIGH (ref 11.5–15.5)
WBC Count: 5.6 10*3/uL (ref 4.0–10.5)
nRBC: 0 % (ref 0.0–0.2)

## 2022-08-14 LAB — CMP (CANCER CENTER ONLY)
ALT: 11 U/L (ref 0–44)
AST: 20 U/L (ref 15–41)
Albumin: 3.9 g/dL (ref 3.5–5.0)
Alkaline Phosphatase: 51 U/L (ref 38–126)
Anion gap: 7 (ref 5–15)
BUN: 20 mg/dL (ref 8–23)
CO2: 27 mmol/L (ref 22–32)
Calcium: 9.1 mg/dL (ref 8.9–10.3)
Chloride: 106 mmol/L (ref 98–111)
Creatinine: 0.67 mg/dL (ref 0.44–1.00)
GFR, Estimated: >60 mL/min (ref >60)
Glucose, Bld: 107 mg/dL — ABNORMAL HIGH (ref 70–99)
Potassium: 3.7 mmol/L (ref 3.5–5.1)
Sodium: 140 mmol/L (ref 135–145)
Total Bilirubin: 0.5 mg/dL (ref 0.3–1.2)
Total Protein: 7.2 g/dL (ref 6.5–8.1)

## 2022-08-14 MED ORDER — HEPARIN SOD (PORK) LOCK FLUSH 100 UNIT/ML IV SOLN
500.0000 [IU] | Freq: Once | INTRAVENOUS | Status: AC | PRN
  Administered 2022-08-14: 500 [IU]

## 2022-08-14 MED ORDER — ACETAMINOPHEN 325 MG PO TABS
650.0000 mg | ORAL_TABLET | Freq: Once | ORAL | Status: AC
  Administered 2022-08-14: 650 mg via ORAL
  Filled 2022-08-14: qty 2

## 2022-08-14 MED ORDER — DIPHENHYDRAMINE HCL 25 MG PO CAPS
50.0000 mg | ORAL_CAPSULE | Freq: Once | ORAL | Status: AC
  Administered 2022-08-14: 50 mg via ORAL
  Filled 2022-08-14: qty 2

## 2022-08-14 MED ORDER — SODIUM CHLORIDE 0.9 % IV SOLN: Freq: Once | INTRAVENOUS | Status: AC

## 2022-08-14 MED ORDER — SODIUM CHLORIDE 0.9% FLUSH
10.0000 mL | INTRAVENOUS | Status: DC | PRN
  Administered 2022-08-14: 10 mL

## 2022-08-14 MED ORDER — PROCHLORPERAZINE MALEATE 10 MG PO TABS
10.0000 mg | ORAL_TABLET | Freq: Once | ORAL | Status: AC
  Administered 2022-08-14: 10 mg via ORAL
  Filled 2022-08-14: qty 1

## 2022-08-14 MED ORDER — SODIUM CHLORIDE 0.9 % IV SOLN
3.6000 mg/kg | Freq: Once | INTRAVENOUS | Status: AC
  Administered 2022-08-14: 320 mg via INTRAVENOUS
  Filled 2022-08-14: qty 16

## 2022-08-14 NOTE — Progress Notes (Signed)
[facility]    Patient Care Team: Margot Ables, MD (Inactive) as PCP - General (Family Medicine) Otho Darner, MD as Consulting Physician (Family Medicine) Donnelly Angelica, RN as Oncology Nurse Navigator Pershing Proud, RN as Oncology Nurse Navigator Rachel Moulds, MD as Consulting Physician (Hematology and Oncology)    Name of the patient: Tami Schmidt  [id number]  [birth year]   Date of visit: 08/14/2022   Chief Complaint/Reason for visit: toxicity check  Current Therapy: Kadcyla  ASSESSMENT & PLAN:   Patient is a 89 y.o. 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 Al Pimple.  I have viewed most recent oncology note and lab work.   #)Estrogen positive progesterone positive and HER2 positive breast cancer recurrence diagnosed in November 2022.   - Labs including CBC and CMP are acceptable for treatment today. -Last staging with stable to mildly improved disease.  She will continue Kadcyla until toxicity or progression.  At this time she has mild fatigue and very mild neuropathy, asymmetrical, unlikely related to Kadcyla and she is reluctant to decrease her dose hard to interrupt Kadcyla.  She wants to continue it.  No concerns on physical examination today.  Repeat imaging ordered for June.  Most recent echocardiogram satisfactory.  CBC reviewed and satisfactory.  CMP pending at the time of my visit.  Okay to proceed if labs are within parameters.   #) Symptom management: Fatigue -Patient has ongoing fatigue from her current treatment. It is manageable.  #3, history of diabetes on Humulin, she apparently has not following up with her PCP anymore, they do not, under her insurance coverage.  She is self managing it.  Last hemoglobin A1c is 6.6%.  She is reluctant to go and see another PCP immediately but she is willing to establish with another 1.  She is  currently on Humulin 15 units.  Heme/Onc History: Oncology History  Malignant neoplasm of lower-inner quadrant of left breast in female, estrogen receptor positive (HCC)  06/16/2013 Initial Diagnosis   Malignant neoplasm of lower-inner quadrant of left breast in female, estrogen receptor positive (HCC)   05/30/2021 - 04/03/2022 Chemotherapy   Patient is on Treatment Plan : BREAST Trastuzumab q28d      Genetic Testing   [facility] CancerNext-Expanded Panel was Negative. 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 Report date is 07/22/2021.  The CancerNext-Expanded gene panel offered by [facility] and includes sequencing, rearrangement, and RNA analysis for the following 77 genes: AIP, ALK, APC, ATM, AXIN2, BAP1, BARD1, BLM, BMPR1A, BRCA1, BRCA2, BRIP1, CDC73, CDH1, CDK4, CDKN1B, CDKN2A, CHEK2, CTNNA1, DICER1, FANCC, FH, FLCN, GALNT12, KIF1B, LZTR1, MAX, MEN1, MET, MLH1, MSH2, MSH3, MSH6, MUTYH, NBN, NF1, NF2, NTHL1, PALB2, PHOX2B, PMS2, POT1, PRKAR1A, PTCH1, PTEN, RAD51C, RAD51D, RB1, RECQL, RET, SDHA, SDHAF2, SDHB, SDHC, SDHD, SMAD4, SMARCA4, SMARCB1, SMARCE1, STK11, SUFU, TMEM127, TP53, TSC1, TSC2, VHL and XRCC2 (sequencing and deletion/duplication); EGFR, EGLN1, HOXB13, KIT, MITF, PDGFRA, POLD1, and POLE (sequencing only); EPCAM and GREM1 (deletion/duplication only).    05/01/2022 -  Chemotherapy   Patient is on Treatment Plan : BREAST ADO-Trastuzumab Emtansine (Kadcyla) q21d      Interval history-: Tami Schmidt is a 89 y.o. female with oncologic history as above seen today for toxicity check prior to Kadcyla. She feels better since restarting Humulin. She is now taking 15 units daily. Besides this, she also reports mild numbness in  the left finger tips. This doesn't limit her from doing any activities. No unusual fatigue, chest pain, cough, SOB Mild constipation, takes a laxative No change in urinary habits, weak stream Most recent ECHO EF 60-65%,  stable. Rest of the pertinent 10 point ROS reviewed and negative  Allergies  Allergen Reactions   Bactrim Swelling    SWELLING OF MOUTH/FACE.   Lisinopril Swelling    SWELLING OF MOUTH/FACE.   Vasotec Swelling    SWELLING OF MOUTH/FACE.   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   Diabetes mellitus    fasting 90-100   HTN (hypertension) 03/25/2013   Hx of radiation therapy    breasts hx   Hypercholesterolemia    Hypertension    Lymphedema of arm    left arm   Pulmonary embolism (HCC)    Type II or unspecified type diabetes mellitus without mention of complication, not stated as uncontrolled 03/25/2013     Past Surgical History:  Procedure Laterality Date   ABDOMINAL HYSTERECTOMY     ANKLE SURGERY Right 1995   APPENDECTOMY     BIOPSY  01/07/2018   Procedure: BIOPSY;  Surgeon: Kerin Salen, MD;  Location: [facility];  Service: Gastroenterology;;   BREAST SURGERY Bilateral    mastectomy   COLONOSCOPY WITH PROPOFOL N/A 01/07/2018   Procedure: COLONOSCOPY WITH PROPOFOL;  Surgeon: Kerin Salen, MD;  Location: [facility];  Service: Gastroenterology;  Laterality: N/A;   ESOPHAGOGASTRODUODENOSCOPY (EGD) WITH PROPOFOL N/A 01/06/2018   Procedure: ESOPHAGOGASTRODUODENOSCOPY (EGD) WITH PROPOFOL;  Surgeon: Kerin Salen, MD;  Location: [facility];  Service: Gastroenterology;  Laterality: N/A;   HERNIA REPAIR  04-26-2010   IR IMAGING GUIDED PORT INSERTION  10/29/2021   MASS EXCISION Left 01/26/2013   Procedure: EXCISION LEFT CHEST WALL MASS AND LEFT ABDOMNAL WALL MASS;  Surgeon: [name], MD;  Location: [facility];  Service: General;  Laterality: Left;   MASS EXCISION Left 09/20/2014   Procedure: EXCISION OF LEFT CHEST WALL MASS;  Surgeon: [name], MD;  Location: [facility];  Service: General;  Laterality: Left;   TEE WITHOUT CARDIOVERSION N/A 12/08/2017   Procedure: TRANSESOPHAGEAL ECHOCARDIOGRAM (TEE);  Surgeon: Lewayne Bunting, MD;  Location: [facility];  Service: Cardiovascular;  Laterality: N/A;   TOTAL HIP ARTHROPLASTY Right 01/05/2017   TOTAL HIP ARTHROPLASTY Right 01/05/2017   Procedure: TOTAL HIP ARTHROPLASTY ANTERIOR APPROACH;  Surgeon: Gean Birchwood, MD;  Location: [facility];  Service: Orthopedics;  Laterality: Right;    Social History   Socioeconomic History   Marital status: Married    Spouse name: Not on file   Number of children: Not on file   Years of education: Not on file   Highest education level: Not on file  Occupational History   Occupation: retired Child psychotherapist  Tobacco Use   Smoking status: Former    Types: Cigarettes    Quit date: 04/14/1972    Years since quitting: 50.3   Smokeless tobacco: Never  Vaping Use   Vaping Use: Never used  Substance and Sexual Activity   Alcohol use: Yes    Comment: occasional   Drug use: Not Currently   Sexual activity: Yes    Birth control/protection: Surgical  Other Topics Concern  Not on file  Social History Narrative   Not on file   Social Determinants of Health   Financial Resource Strain: Not on file  Food Insecurity: Not on file  Transportation Needs: Not on file  Physical Activity: Not on file  Stress: Not on file  Social Connections: Not on file  Intimate Partner Violence: Not on file    Family History  Problem Relation Age of Onset   Breast cancer Cousin        maternal first cousin   Uterine cancer Cousin        maternal first cousin     Current Outpatient Medications:    amLODipine (NORVASC) 10 MG tablet, Take 10 mg by mouth daily., Disp: , Rfl:    apixaban (ELIQUIS) 5 MG TABS tablet, Take 1 tablet (5 mg total) by mouth 2 (two) times daily., Disp: 180 tablet, Rfl: 3   lidocaine-prilocaine (EMLA) cream, Apply to affected area once (Patient not  taking: Reported on 07/03/2022), Disp: 30 g, Rfl: 3   metFORMIN (GLUCOPHAGE) 500 MG tablet, Take 500 mg by mouth daily with breakfast., Disp: , Rfl:    olmesartan-hydrochlorothiazide (BENICAR HCT) 40-12.5 MG tablet, Take 1 tablet by mouth daily., Disp: , Rfl:    ondansetron (ZOFRAN) 8 MG tablet, Take 1 tablet (8 mg total) by mouth every 8 (eight) hours as needed for nausea or vomiting. (Patient not taking: Reported on 07/03/2022), Disp: 30 tablet, Rfl: 1   prochlorperazine (COMPAZINE) 10 MG tablet, Take 1 tablet (10 mg total) by mouth every 6 (six) hours as needed for nausea or vomiting. (Patient not taking: Reported on 07/03/2022), Disp: 30 tablet, Rfl: 1   SM IRON 325 (65 Fe) MG tablet, Take 325 mg by mouth daily., Disp: , Rfl:    Turmeric (QC TUMERIC COMPLEX PO), Take 1 tablet by mouth daily., Disp: , Rfl:    TYLENOL 325 MG tablet, Take 325 mg by mouth daily., Disp: , Rfl:    VITAMIN D PO, Take 1 tablet by mouth daily., Disp: , Rfl:   PHYSICAL EXAM: ECOG FS:1 - Symptomatic but completely ambulatory    Vitals:   08/14/22 0911  BP: (!) 163/54  Pulse: 98  Resp: 16  Temp: (!) 97 F (36.1 C)  TempSrc: Temporal  SpO2: 100%  Weight: 189 lb (85.7 kg)    Physical Exam Vitals and nursing note reviewed.  Constitutional:      Appearance: She is well-developed. She is not ill-appearing or toxic-appearing.  HENT:     Head: Normocephalic.     Nose: Nose normal.  Eyes:     Conjunctiva/sclera: Conjunctivae normal.  Neck:     Vascular: No JVD.  Cardiovascular:     Rate and Rhythm: Rhythm irregular.     Pulses:          Radial pulses are 2+ on the right side and 2+ on the left side.  Pulmonary:     Effort: Pulmonary effort is normal.     Breath sounds: Normal breath sounds.  Chest:     Comments: Left axillary adenopathy improving Abdominal:     General: There is no distension.  Musculoskeletal:     Cervical back: Normal range of motion.     Comments: LUE lymphedema  Skin:    General:  Skin is warm and dry.     Capillary Refill: Capillary refill takes less than 2 seconds.  Neurological:     Mental Status: She is oriented to person, place, and time.  LABORATORY DATA: I have reviewed the data as listed    Latest Ref Rng & Units 08/14/2022    8:54 AM 07/24/2022   11:37 AM 07/03/2022    8:20 AM  CBC  WBC 4.0 - 10.5 K/uL 5.6  6.5  6.7   Hemoglobin 12.0 - 15.0 g/dL 16.1  09.6  04.5   Hematocrit 36.0 - 46.0 % 35.2  34.7  36.0   Platelets 150 - 400 K/uL 205  248  245         Latest Ref Rng & Units 07/24/2022   11:37 AM 07/03/2022    8:20 AM 06/12/2022   12:17 PM  CMP  Glucose 70 - 99 mg/dL 409  811  914   BUN 8 - 23 mg/dL 19  14  17    Creatinine 0.44 - 1.00 mg/dL 7.82  9.56  2.13   Sodium 135 - 145 mmol/L 138  139  137   Potassium 3.5 - 5.1 mmol/L 3.7  3.4  3.8   Chloride 98 - 111 mmol/L 103  103  101   CO2 22 - 32 mmol/L 28  27  27    Calcium 8.9 - 10.3 mg/dL 9.5  9.2  8.7   Total Protein 6.5 - 8.1 g/dL 7.4  7.1  6.6   Total Bilirubin 0.3 - 1.2 mg/dL 0.4  0.5  0.5   Alkaline Phos 38 - 126 U/L 56  55  57   AST 15 - 41 U/L 16  17  17    ALT 0 - 44 U/L 9  10  13         RADIOGRAPHIC STUDIES (from last 24 hours if applicable) I have personally reviewed the radiological images as listed and agreed with the findings in the report. No results found.    Visit Diagnosis: 1. Malignant neoplasm of lower-inner quadrant of left breast in female, estrogen receptor positive (HCC)       No orders of the defined types were placed in this encounter.   All questions were answered. The patient knows to call the clinic with any problems, questions or concerns. No barriers to learning was detected.  I have spent a total of 30 minutes minutes of face-to-face and non-face-to-face time, preparing to see the patient, obtaining and/or reviewing separately obtained history, performing a medically appropriate examination, counseling and educating the patient, ordering  tests, documenting clinical information in the electronic health record, and care coordination (communications with other health care professionals or caregivers).   Thank you for allowing me to participate in the care of this patient.    Rachel Moulds, MD Department of Hematology/Oncology [facility] Phone: [phone]  [phone]

## 2022-08-14 NOTE — Progress Notes (Signed)
Patient declined 30 min post obs.  VSS at discharge, tolerated treatment well without incident.  Ambulated to lobby.

## 2022-08-14 NOTE — Patient Instructions (Signed)
[facility]  Discharge Instructions: Thank you for choosing [facility] to provide your oncology and hematology care.   If you have a lab appointment with the Cancer Center, please go directly to the Cancer Center and check in at the registration area.   Wear comfortable clothing and clothing appropriate for easy access to any Portacath or PICC line.   We strive to give you quality time with your provider. You may need to reschedule your appointment if you arrive late (15 or more minutes).  Arriving late affects you and other patients whose appointments are after yours.  Also, if you miss three or more appointments without notifying the office, you may be dismissed from the clinic at the provider's discretion.      For prescription refill requests, have your pharmacy contact our office and allow 72 hours for refills to be completed.    Today you received the following chemotherapy and/or immunotherapy agents: Kadcyla.       To help prevent nausea and vomiting after your treatment, we encourage you to take your nausea medication as directed.  BELOW ARE SYMPTOMS THAT SHOULD BE REPORTED IMMEDIATELY: *FEVER GREATER THAN 100.4 F (38 C) OR HIGHER *CHILLS OR SWEATING *NAUSEA AND VOMITING THAT IS NOT CONTROLLED WITH YOUR NAUSEA MEDICATION *UNUSUAL SHORTNESS OF BREATH *UNUSUAL BRUISING OR BLEEDING *URINARY PROBLEMS (pain or burning when urinating, or frequent urination) *BOWEL PROBLEMS (unusual diarrhea, constipation, pain near the anus) TENDERNESS IN MOUTH AND THROAT WITH OR WITHOUT PRESENCE OF ULCERS (sore throat, sores in mouth, or a toothache) UNUSUAL RASH, SWELLING OR PAIN  UNUSUAL VAGINAL DISCHARGE OR ITCHING   Items with * indicate a potential emergency and should be followed up as soon as possible or go to the Emergency Department if any problems should occur.  Please show the CHEMOTHERAPY ALERT CARD or IMMUNOTHERAPY ALERT CARD at  check-in to the Emergency Department and triage nurse.  Should you have questions after your visit or need to cancel or reschedule your appointment, please contact [facility]  Dept: [phone]  and follow the prompts.  Office hours are 8:00 a.m. to 4:30 p.m. Monday - Friday. Please note that voicemails left after 4:00 p.m. may not be returned until the following business day.  We are closed weekends and major holidays. You have access to a nurse at all times for urgent questions. Please call the main number to the clinic Dept: [phone] and follow the prompts.   For any non-urgent questions, you may also contact your provider using MyChart. We now offer e-Visits for anyone 18 and older to request care online for non-urgent symptoms. For details visit [email or web address].   Also download the MyChart app! Go to the app store, search "MyChart", open the app, select [facility], and log in with your MyChart username and password.   

## 2022-09-02 ENCOUNTER — Ambulatory Visit (HOSPITAL_COMMUNITY)
Admission: RE | Admit: 2022-09-02 | Discharge: 2022-09-02 | Disposition: A | Discharge status: Home / Self Care | Payer: Medicare Other | Source: Ambulatory Visit | Attending: Adult Health | Admitting: Adult Health

## 2022-09-02 DIAGNOSIS — C50919 Malignant neoplasm of unspecified site of unspecified female breast: Secondary | ICD-10-CM | POA: Diagnosis not present

## 2022-09-02 DIAGNOSIS — Z170 Estrogen receptor positive status [ER+]: Secondary | ICD-10-CM

## 2022-09-02 DIAGNOSIS — C50312 Malignant neoplasm of lower-inner quadrant of left female breast: Secondary | ICD-10-CM | POA: Insufficient documentation

## 2022-09-02 DIAGNOSIS — C7989 Secondary malignant neoplasm of other specified sites: Secondary | ICD-10-CM | POA: Insufficient documentation

## 2022-09-02 MED ORDER — IOHEXOL 300 MG/ML  SOLN
100.0000 mL | Freq: Once | INTRAMUSCULAR | Status: AC | PRN
  Administered 2022-09-02: 100 mL via INTRAVENOUS

## 2022-09-02 MED ORDER — HEPARIN SOD (PORK) LOCK FLUSH 100 UNIT/ML IV SOLN
500.0000 [IU] | Freq: Once | INTRAVENOUS | Status: AC
  Administered 2022-09-02: 500 [IU] via INTRAVENOUS

## 2022-09-04 ENCOUNTER — Other Ambulatory Visit: Payer: Self-pay

## 2022-09-04 ENCOUNTER — Inpatient Hospital Stay: Payer: Medicare Other

## 2022-09-04 ENCOUNTER — Inpatient Hospital Stay (HOSPITAL_BASED_OUTPATIENT_CLINIC_OR_DEPARTMENT_OTHER): Payer: Medicare Other | Admitting: Hematology and Oncology

## 2022-09-04 DIAGNOSIS — Z170 Estrogen receptor positive status [ER+]: Secondary | ICD-10-CM

## 2022-09-04 DIAGNOSIS — C50312 Malignant neoplasm of lower-inner quadrant of left female breast: Secondary | ICD-10-CM | POA: Diagnosis not present

## 2022-09-04 DIAGNOSIS — Z5112 Encounter for antineoplastic immunotherapy: Secondary | ICD-10-CM | POA: Diagnosis not present

## 2022-09-04 LAB — CBC WITH DIFFERENTIAL (CANCER CENTER ONLY)
Abs Immature Granulocytes: 0.01 10*3/uL (ref 0.00–0.07)
Basophils Absolute: 0.1 10*3/uL (ref 0.0–0.1)
Basophils Relative: 1 %
Eosinophils Absolute: 0.2 10*3/uL (ref 0.0–0.5)
Eosinophils Relative: 4 %
HCT: 34.5 % — ABNORMAL LOW (ref 36.0–46.0)
Hemoglobin: 10.9 g/dL — ABNORMAL LOW (ref 12.0–15.0)
Immature Granulocytes: 0 %
Lymphocytes Relative: 32 %
Lymphs Abs: 1.8 10*3/uL (ref 0.7–4.0)
MCH: 24.7 pg — ABNORMAL LOW (ref 26.0–34.0)
MCHC: 31.6 g/dL (ref 30.0–36.0)
MCV: 78.1 fL — ABNORMAL LOW (ref 80.0–100.0)
Monocytes Absolute: 0.5 10*3/uL (ref 0.1–1.0)
Monocytes Relative: 9 %
Neutro Abs: 3 10*3/uL (ref 1.7–7.7)
Neutrophils Relative %: 54 %
Platelet Count: 205 10*3/uL (ref 150–400)
RBC: 4.42 MIL/uL (ref 3.87–5.11)
RDW: 17.4 % — ABNORMAL HIGH (ref 11.5–15.5)
WBC Count: 5.5 10*3/uL (ref 4.0–10.5)
nRBC: 0 % (ref 0.0–0.2)

## 2022-09-04 LAB — CMP (CANCER CENTER ONLY)
ALT: 10 U/L (ref 0–44)
AST: 19 U/L (ref 15–41)
Albumin: 4 g/dL (ref 3.5–5.0)
Alkaline Phosphatase: 48 U/L (ref 38–126)
Anion gap: 7 (ref 5–15)
BUN: 21 mg/dL (ref 8–23)
CO2: 28 mmol/L (ref 22–32)
Calcium: 9.2 mg/dL (ref 8.9–10.3)
Chloride: 108 mmol/L (ref 98–111)
Creatinine: 0.72 mg/dL (ref 0.44–1.00)
GFR, Estimated: >60 mL/min (ref >60)
Glucose, Bld: 103 mg/dL — ABNORMAL HIGH (ref 70–99)
Potassium: 3.5 mmol/L (ref 3.5–5.1)
Sodium: 143 mmol/L (ref 135–145)
Total Bilirubin: 0.5 mg/dL (ref 0.3–1.2)
Total Protein: 7 g/dL (ref 6.5–8.1)

## 2022-09-04 MED ORDER — SODIUM CHLORIDE 0.9% FLUSH
10.0000 mL | INTRAVENOUS | Status: DC | PRN
  Administered 2022-09-04: 10 mL

## 2022-09-04 MED ORDER — HEPARIN SOD (PORK) LOCK FLUSH 100 UNIT/ML IV SOLN
500.0000 [IU] | Freq: Once | INTRAVENOUS | Status: AC | PRN
  Administered 2022-09-04: 500 [IU]

## 2022-09-04 MED ORDER — DIPHENHYDRAMINE HCL 25 MG PO CAPS
50.0000 mg | ORAL_CAPSULE | Freq: Once | ORAL | Status: AC
  Administered 2022-09-04: 50 mg via ORAL
  Filled 2022-09-04: qty 2

## 2022-09-04 MED ORDER — ACETAMINOPHEN 325 MG PO TABS
650.0000 mg | ORAL_TABLET | Freq: Once | ORAL | Status: AC
  Administered 2022-09-04: 650 mg via ORAL
  Filled 2022-09-04: qty 2

## 2022-09-04 MED ORDER — PROCHLORPERAZINE MALEATE 10 MG PO TABS
10.0000 mg | ORAL_TABLET | Freq: Once | ORAL | Status: AC
  Administered 2022-09-04: 10 mg via ORAL
  Filled 2022-09-04: qty 1

## 2022-09-04 MED ORDER — SODIUM CHLORIDE 0.9 % IV SOLN: Freq: Once | INTRAVENOUS | Status: AC

## 2022-09-04 MED ORDER — SODIUM CHLORIDE 0.9 % IV SOLN
3.6000 mg/kg | Freq: Once | INTRAVENOUS | Status: AC
  Administered 2022-09-04: 320 mg via INTRAVENOUS
  Filled 2022-09-04: qty 16

## 2022-09-04 NOTE — Progress Notes (Signed)
[facility]    Patient Care Team: Margot Ables, MD (Inactive) as PCP - General (Family Medicine) Otho Darner, MD as Consulting Physician (Family Medicine) Donnelly Angelica, RN as Oncology Nurse Navigator Pershing Proud, RN as Oncology Nurse Navigator Rachel Moulds, MD as Consulting Physician (Hematology and Oncology)    Name of the patient: Tami Schmidt  [id number]  1932-12-31   Date of visit: 09/04/2022   Chief Complaint/Reason for visit: toxicity check  Current Therapy: Kadcyla  ASSESSMENT & PLAN:   Patient is a 89 y.o. 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   Estrogen positive progesterone positive and HER2 positive breast cancer recurrence diagnosed in November 2022.   Patient is currently on second line Kadcyla.  For first-line she received Herceptin and pertuzumab alone given her age and comorbidities.  Today she tells me that she is starting to get tired of this process.  The medication itself does not give her many side effects except for burning pain in her legs at night.  She has uncontrolled diabetes and is currently on insulin but believes that burning sensation in her legs is relatively new.  I once again discussed that we can maybe consider palliative radiation given her oligometastatic disease and hold off on systemic treatment.  I have reached out to Dr. Mitzi Hansen and will await for additional recommendations. Most recent imaging yet to be officially read. She is agreeable to this plan.  Heme/Onc History: Oncology History  Malignant neoplasm of lower-inner quadrant of left breast in female, estrogen receptor positive (HCC)  06/16/2013 Initial Diagnosis   Malignant neoplasm of lower-inner quadrant of left breast in female, estrogen receptor positive (HCC)   05/30/2021 - 04/03/2022 Chemotherapy   Patient is on Treatment Plan :  BREAST Trastuzumab q28d      Genetic Testing   [facility] CancerNext-Expanded Panel was Negative. 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 Report date is 07/22/2021.  The CancerNext-Expanded gene panel offered by [facility] and includes sequencing, rearrangement, and RNA analysis for the following 77 genes: AIP, ALK, APC, ATM, AXIN2, BAP1, BARD1, BLM, BMPR1A, BRCA1, BRCA2, BRIP1, CDC73, CDH1, CDK4, CDKN1B, CDKN2A, CHEK2, CTNNA1, DICER1, FANCC, FH, FLCN, GALNT12, KIF1B, LZTR1, MAX, MEN1, MET, MLH1, MSH2, MSH3, MSH6, MUTYH, NBN, NF1, NF2, NTHL1, PALB2, PHOX2B, PMS2, POT1, PRKAR1A, PTCH1, PTEN, RAD51C, RAD51D, RB1, RECQL, RET, SDHA, SDHAF2, SDHB, SDHC, SDHD, SMAD4, SMARCA4, SMARCB1, SMARCE1, STK11, SUFU, TMEM127, TP53, TSC1, TSC2, VHL and XRCC2 (sequencing and deletion/duplication); EGFR, EGLN1, HOXB13, KIT, MITF, PDGFRA, POLD1, and POLE (sequencing only); EPCAM and GREM1 (deletion/duplication only).    05/01/2022 -  Chemotherapy   Patient is on Treatment Plan : BREAST ADO-Trastuzumab Emtansine (Kadcyla) q21d      Interval history-: Tami Schmidt is a 89 y.o. female with oncologic history as above seen today for toxicity check prior to Kadcyla.  She had her CT scan on 5.21, not officially read yet. She says she is getting a little tired of coming here frequently. No change in her health otherwise. No diarrhea. She says she is mentally tired. She still says she wants to continue treatment. She asks same questions about when she can get rid of it, why not surgery, why not radiation. She complains of some fire sensation in legs, both legs at night. She says this started about 2 months ago. She wants to proceed  with treatment today and hold and see if she can do radiation.  Allergies  Allergen Reactions   Bactrim Swelling    SWELLING OF MOUTH/FACE.   Lisinopril Swelling    SWELLING OF MOUTH/FACE.   Vasotec Swelling    SWELLING OF MOUTH/FACE.    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   Diabetes mellitus    fasting 90-100   HTN (hypertension) 03/25/2013   Hx of radiation therapy    breasts hx   Hypercholesterolemia    Hypertension    Lymphedema of arm    left arm   Pulmonary embolism (HCC)    Type II or unspecified type diabetes mellitus without mention of complication, not stated as uncontrolled 03/25/2013     Past Surgical History:  Procedure Laterality Date   ABDOMINAL HYSTERECTOMY     ANKLE SURGERY Right 1995   APPENDECTOMY     BIOPSY  01/07/2018   Procedure: BIOPSY;  Surgeon: Kerin Salen, MD;  Location: [facility];  Service: Gastroenterology;;   BREAST SURGERY Bilateral    mastectomy   COLONOSCOPY WITH PROPOFOL N/A 01/07/2018   Procedure: COLONOSCOPY WITH PROPOFOL;  Surgeon: Kerin Salen, MD;  Location: [facility];  Service: Gastroenterology;  Laterality: N/A;   ESOPHAGOGASTRODUODENOSCOPY (EGD) WITH PROPOFOL N/A 01/06/2018   Procedure: ESOPHAGOGASTRODUODENOSCOPY (EGD) WITH PROPOFOL;  Surgeon: Kerin Salen, MD;  Location: [facility];  Service: Gastroenterology;  Laterality: N/A;   HERNIA REPAIR  04-26-2010   IR IMAGING GUIDED PORT INSERTION  10/29/2021   MASS EXCISION Left 01/26/2013   Procedure: EXCISION LEFT CHEST WALL MASS AND LEFT ABDOMNAL WALL MASS;  Surgeon: [name], MD;  Location: [facility];  Service: General;  Laterality: Left;   MASS EXCISION Left 09/20/2014   Procedure: EXCISION OF LEFT CHEST WALL MASS;  Surgeon: [name], MD;  Location: [facility];  Service: General;  Laterality: Left;   TEE WITHOUT CARDIOVERSION N/A 12/08/2017   Procedure: TRANSESOPHAGEAL ECHOCARDIOGRAM (TEE);  Surgeon: Lewayne Bunting, MD;  Location: [facility];  Service: Cardiovascular;  Laterality:  N/A;   TOTAL HIP ARTHROPLASTY Right 01/05/2017   TOTAL HIP ARTHROPLASTY Right 01/05/2017   Procedure: TOTAL HIP ARTHROPLASTY ANTERIOR APPROACH;  Surgeon: Gean Birchwood, MD;  Location: [facility] OR;  Service: Orthopedics;  Laterality: Right;    Social History   Socioeconomic History   Marital status: Married    Spouse name: Not on file   Number of children: Not on file   Years of education: Not on file   Highest education level: Not on file  Occupational History   Occupation: retired Child psychotherapist  Tobacco Use   Smoking status: Former    Types: Cigarettes    Quit date: 04/14/1972    Years since quitting: 50.4   Smokeless tobacco: Never  Vaping Use   Vaping Use: Never used  Substance and Sexual Activity   Alcohol use: Yes    Comment: occasional   Drug use: Not Currently   Sexual activity: Yes    Birth control/protection: Surgical  Other Topics Concern   Not on file  Social History Narrative   Not on file   Social Determinants of Health   Financial Resource Strain: Not on file  Food Insecurity: Not on file  Transportation Needs:  Not on file  Physical Activity: Not on file  Stress: Not on file  Social Connections: Not on file  Intimate Partner Violence: Not on file    Family History  Problem Relation Age of Onset   Breast cancer Cousin        maternal first cousin   Uterine cancer Cousin        maternal first cousin     Current Outpatient Medications:    amLODipine (NORVASC) 10 MG tablet, Take 10 mg by mouth daily., Disp: , Rfl:    apixaban (ELIQUIS) 5 MG TABS tablet, Take 1 tablet (5 mg total) by mouth 2 (two) times daily., Disp: 180 tablet, Rfl: 3   lidocaine-prilocaine (EMLA) cream, Apply to affected area once (Patient not taking: Reported on 07/03/2022), Disp: 30 g, Rfl: 3   metFORMIN (GLUCOPHAGE) 500 MG tablet, Take 500 mg by mouth daily with breakfast., Disp: , Rfl:    olmesartan-hydrochlorothiazide (BENICAR HCT) 40-12.5 MG tablet, Take 1 tablet by mouth daily.,  Disp: , Rfl:    ondansetron (ZOFRAN) 8 MG tablet, Take 1 tablet (8 mg total) by mouth every 8 (eight) hours as needed for nausea or vomiting. (Patient not taking: Reported on 07/03/2022), Disp: 30 tablet, Rfl: 1   prochlorperazine (COMPAZINE) 10 MG tablet, Take 1 tablet (10 mg total) by mouth every 6 (six) hours as needed for nausea or vomiting. (Patient not taking: Reported on 07/03/2022), Disp: 30 tablet, Rfl: 1   SM IRON 325 (65 Fe) MG tablet, Take 325 mg by mouth daily., Disp: , Rfl:    Turmeric (QC TUMERIC COMPLEX PO), Take 1 tablet by mouth daily., Disp: , Rfl:    TYLENOL 325 MG tablet, Take 325 mg by mouth daily., Disp: , Rfl:    VITAMIN D PO, Take 1 tablet by mouth daily., Disp: , Rfl:   PHYSICAL EXAM: ECOG FS:1 - Symptomatic but completely ambulatory    There were no vitals filed for this visit.   Physical Exam Vitals and nursing note reviewed.  Constitutional:      Appearance: She is well-developed. She is not ill-appearing or toxic-appearing.  HENT:     Head: Normocephalic.     Nose: Nose normal.  Eyes:     Conjunctiva/sclera: Conjunctivae normal.  Neck:     Vascular: No JVD.  Cardiovascular:     Rate and Rhythm: Rhythm irregular.     Pulses:          Radial pulses are 2+ on the right side and 2+ on the left side.  Pulmonary:     Effort: Pulmonary effort is normal.     Breath sounds: Normal breath sounds.  Chest:     Comments: Left axillary adenopathy improving Abdominal:     General: There is no distension.  Musculoskeletal:     Cervical back: Normal range of motion.     Comments: LUE lymphedema  Skin:    General: Skin is warm and dry.     Capillary Refill: Capillary refill takes less than 2 seconds.  Neurological:     Mental Status: She is oriented to person, place, and time.        LABORATORY DATA: I have reviewed the data as listed    Latest Ref Rng & Units 08/14/2022    8:54 AM 07/24/2022   11:37 AM 07/03/2022    8:20 AM  CBC  WBC 4.0 - 10.5 K/uL  5.6  6.5  6.7   Hemoglobin 12.0 - 15.0 g/dL 16.1  09.6  11.5   Hematocrit 36.0 - 46.0 % 35.2  34.7  36.0   Platelets 150 - 400 K/uL 205  248  245         Latest Ref Rng & Units 08/14/2022    8:54 AM 07/24/2022   11:37 AM 07/03/2022    8:20 AM  CMP  Glucose 70 - 99 mg/dL 161  096  045   BUN 8 - 23 mg/dL 20  19  14    Creatinine 0.44 - 1.00 mg/dL 4.09  8.11  9.14   Sodium 135 - 145 mmol/L 140  138  139   Potassium 3.5 - 5.1 mmol/L 3.7  3.7  3.4   Chloride 98 - 111 mmol/L 106  103  103   CO2 22 - 32 mmol/L 27  28  27    Calcium 8.9 - 10.3 mg/dL 9.1  9.5  9.2   Total Protein 6.5 - 8.1 g/dL 7.2  7.4  7.1   Total Bilirubin 0.3 - 1.2 mg/dL 0.5  0.4  0.5   Alkaline Phos 38 - 126 U/L 51  56  55   AST 15 - 41 U/L 20  16  17    ALT 0 - 44 U/L 11  9  10         RADIOGRAPHIC STUDIES (from last 24 hours if applicable) I have personally reviewed the radiological images as listed and agreed with the findings in the report. No results found.    Visit Diagnosis: No diagnosis found.     No orders of the defined types were placed in this encounter.   All questions were answered. The patient knows to call the clinic with any problems, questions or concerns. No barriers to learning was detected.  I have spent a total of 30 minutes minutes of face-to-face and non-face-to-face time, preparing to see the patient, obtaining and/or reviewing separately obtained history, performing a medically appropriate examination, counseling and educating the patient, ordering tests, documenting clinical information in the electronic health record, and care coordination (communications with other health care professionals or caregivers).   Thank you for allowing me to participate in the care of this patient.    Rachel Moulds, MD Department of Hematology/Oncology [facility] Phone: [phone]  [phone]

## 2022-09-04 NOTE — Patient Instructions (Signed)
[facility]  Discharge Instructions: Thank you for choosing [facility] to provide your oncology and hematology care.   If you have a lab appointment with the Cancer Center, please go directly to the Cancer Center and check in at the registration area.   Wear comfortable clothing and clothing appropriate for easy access to any Portacath or PICC line.   We strive to give you quality time with your provider. You may need to reschedule your appointment if you arrive late (15 or more minutes).  Arriving late affects you and other patients whose appointments are after yours.  Also, if you miss three or more appointments without notifying the office, you may be dismissed from the clinic at the provider's discretion.      For prescription refill requests, have your pharmacy contact our office and allow 72 hours for refills to be completed.    Today you received the following chemotherapy and/or immunotherapy agents: Kadcyla.       To help prevent nausea and vomiting after your treatment, we encourage you to take your nausea medication as directed.  BELOW ARE SYMPTOMS THAT SHOULD BE REPORTED IMMEDIATELY: *FEVER GREATER THAN 100.4 F (38 C) OR HIGHER *CHILLS OR SWEATING *NAUSEA AND VOMITING THAT IS NOT CONTROLLED WITH YOUR NAUSEA MEDICATION *UNUSUAL SHORTNESS OF BREATH *UNUSUAL BRUISING OR BLEEDING *URINARY PROBLEMS (pain or burning when urinating, or frequent urination) *BOWEL PROBLEMS (unusual diarrhea, constipation, pain near the anus) TENDERNESS IN MOUTH AND THROAT WITH OR WITHOUT PRESENCE OF ULCERS (sore throat, sores in mouth, or a toothache) UNUSUAL RASH, SWELLING OR PAIN  UNUSUAL VAGINAL DISCHARGE OR ITCHING   Items with * indicate a potential emergency and should be followed up as soon as possible or go to the Emergency Department if any problems should occur.  Please show the CHEMOTHERAPY ALERT CARD or IMMUNOTHERAPY ALERT CARD at  check-in to the Emergency Department and triage nurse.  Should you have questions after your visit or need to cancel or reschedule your appointment, please contact [facility]  Dept: [phone]  and follow the prompts.  Office hours are 8:00 a.m. to 4:30 p.m. Monday - Friday. Please note that voicemails left after 4:00 p.m. may not be returned until the following business day.  We are closed weekends and major holidays. You have access to a nurse at all times for urgent questions. Please call the main number to the clinic Dept: [phone] and follow the prompts.   For any non-urgent questions, you may also contact your provider using MyChart. We now offer e-Visits for anyone 18 and older to request care online for non-urgent symptoms. For details visit [email or web address].   Also download the MyChart app! Go to the app store, search "MyChart", open the app, select [place], and log in with your MyChart username and password.   

## 2022-09-05 ENCOUNTER — Ambulatory Visit
Admission: RE | Admit: 2022-09-05 | Discharge: 2022-09-05 | Disposition: A | Discharge status: Home / Self Care | Payer: Medicare Other | Source: Ambulatory Visit | Attending: Radiation Oncology | Admitting: Radiation Oncology

## 2022-09-05 ENCOUNTER — Encounter: Payer: Self-pay | Admitting: Radiation Oncology

## 2022-09-05 ENCOUNTER — Telehealth: Payer: Self-pay | Admitting: Radiation Oncology

## 2022-09-05 VITALS — BP 148/94 | HR 77 | Temp 97.5°F | Resp 18 | Wt 185.8 lb

## 2022-09-05 DIAGNOSIS — Z79899 Other long term (current) drug therapy: Secondary | ICD-10-CM | POA: Diagnosis not present

## 2022-09-05 DIAGNOSIS — Z7984 Long term (current) use of oral hypoglycemic drugs: Secondary | ICD-10-CM | POA: Insufficient documentation

## 2022-09-05 DIAGNOSIS — Z87891 Personal history of nicotine dependence: Secondary | ICD-10-CM | POA: Insufficient documentation

## 2022-09-05 DIAGNOSIS — Z170 Estrogen receptor positive status [ER+]: Secondary | ICD-10-CM

## 2022-09-05 DIAGNOSIS — I10 Essential (primary) hypertension: Secondary | ICD-10-CM | POA: Insufficient documentation

## 2022-09-05 DIAGNOSIS — E7800 Pure hypercholesterolemia, unspecified: Secondary | ICD-10-CM | POA: Insufficient documentation

## 2022-09-05 DIAGNOSIS — C50312 Malignant neoplasm of lower-inner quadrant of left female breast: Secondary | ICD-10-CM

## 2022-09-05 DIAGNOSIS — Z9013 Acquired absence of bilateral breasts and nipples: Secondary | ICD-10-CM | POA: Diagnosis not present

## 2022-09-05 DIAGNOSIS — Z803 Family history of malignant neoplasm of breast: Secondary | ICD-10-CM | POA: Diagnosis not present

## 2022-09-05 DIAGNOSIS — Z7901 Long term (current) use of anticoagulants: Secondary | ICD-10-CM | POA: Diagnosis not present

## 2022-09-05 DIAGNOSIS — Z79810 Long term (current) use of selective estrogen receptor modulators (SERMs): Secondary | ICD-10-CM | POA: Insufficient documentation

## 2022-09-05 DIAGNOSIS — Z86711 Personal history of pulmonary embolism: Secondary | ICD-10-CM | POA: Insufficient documentation

## 2022-09-05 DIAGNOSIS — E119 Type 2 diabetes mellitus without complications: Secondary | ICD-10-CM | POA: Insufficient documentation

## 2022-09-05 NOTE — Progress Notes (Signed)
New Breast Cancer Diagnosis: Left Breast LIQ recurrence  -right lumpectomy and axillary lymph node dissection. She had a total of 6 lymph nodes removed. She was treated with adjuvant radiation and then received tamoxifen between 1993 and 1998.   -In 2002 she had a local recurrence in the right breast, and underwent right mastectomy. She received tamoxifen between 2002 and 2007.   -In 2004 she underwent a left mastectomy and sentinel lymph node sampling for a 9 mm mucinous carcinoma, grade 2, multifocal, associated with ductal carcinoma in situ. The single sentinel lymph node was negative. Margins were ample. She did not receive adjuvant radiation, but continued on tamoxifen as noted above, until 2007.   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   All 3 masses were removed 01/26/2013. 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 The lesion abutted the closest inked resection margin. The other 2 masses proved to be lipomas.     Surgeon and surgical plan, if any: -Right Lumpectomy 1993 -Right Mastectomy 2002 -Left Mastectomy 2004 -Excision of Left Chest Wall Mass 01/26/2013   Medical oncologist, treatment if any:   Dr. Al Pimple 09/04/2022 -She says she is mentally tired. -She still says she wants to continue treatment. She asks same questions about when she can get rid of it, why not surgery, why not radiation. -She wants to proceed with treatment today and hold and see if she can do radiation.  -Chemotherapy 2/16-12/21/2023 -Chemotherapy 05/01/2022-Current   Family History of Breast/Ovarian/Prostate Cancer: Maternal first Cousin had breast cancer, maternal first cousin had uterine cancer.  Lymphedema issues, if any: No     Pain issues, if any: No     SAFETY ISSUES: Pacemaker/ICD? No Possible current pregnancy? Hysterectomy Is the patient on methotrexate? No Prior radiation? 1993 right breast in [place], 03/31/2013-05/23/2013 Left chest wall and Supraclavicular region.   Current Complaints / other details:

## 2022-09-05 NOTE — Telephone Encounter (Signed)
5/24 @ 8:31 am Called patient left voicemail to see if patient would like to come in on today or next Friday for consult -per Bryan Lemma.  Waiting on call back.

## 2022-09-05 NOTE — Progress Notes (Signed)
Radiation Oncology         [phone] ________________________________  Name: Tami Schmidt        MRN: [id number]  Date of Service: 09/05/2022 DOB: [birth year]  [id number], [name], MD (Inactive)  Rachel Moulds, MD     REFERRING PHYSICIAN: Rachel Moulds, MD   DIAGNOSIS: The encounter diagnosis was Malignant neoplasm of lower-inner quadrant of left breast in female, estrogen receptor positive (HCC).   HISTORY OF PRESENT ILLNESS: Tami Schmidt is a 89 y.o. female seen at the request of Dr. Al Pimple. Patient has a lengthy breast cancer history as described below:  In 1993 she was diagnosed with right breast cancer (details are unknown). She underwent a right lumpectomy and axillary lymph node dissection with a total of 6 lymph nodes removed. She was treated with adjuvant radiation and then received tamoxifen between 1993 and 1998.    In 2002 she had a local recurrence in the right breast, and underwent a right mastectomy. She received tamoxifen between 2002 and 2007.    In 2004 she underwent a left mastectomy and sentinel lymph node sampling for a 9 mm mucinous carcinoma, grade 2, multifocal, associated with ductal carcinoma in situ. The single sentinel lymph node was negative. Margins were ample. She did not receive adjuvant radiation, but continued on tamoxifen as noted above, until 2007.    In July of 2014, the patient noted a "pimple" in the left chest wall, superior to her prior mastectomy incision, a large lipoma in the left clavicular area and a second one on her abdominal wall.   All 3 masses were removed 01/26/2013. The pathology from this procedure ([id number]) showed, in the chest wall, an invasive ductal carcinoma measuring grossly 1.3 cm. The lesion abutted the closest inked resection margin. The other 2 masses proved to be lipomas. Patient underwent radiation treatment to the left chest wall and supraclavicular region. She was started on anastrozole therapy, but  discontinued this in 2016 due to fatigue and arthralgias. She was started on tamoxifen therapy and stopped in 2019, for a total of 5 years of antiestrogen therapy. In 2022 she noted nodularity to her left chest wall with left upper extremity lymphedema. Subsequent CT scan on 03/05/21 showed left axillary and subpectoral lymphadenopathy, highly concerning for metastatic disease given her history. Patient was started on chemotherapy under the care of Dr. Al Pimple in 2023.   Most recently, she saw Dr. Al Pimple on 09/04/22 for routine follow-up. Previous CT scans showed persistent enlargement of the left axillary and subpectoral adenopathy. Sub carinal adenopathy had mildly improved. At this visit, patient stated she is growing tired of chemotherapy infusions and was inquiring about other treatment options. The medication itself is tolerable (side effects include burning pain in her legs at night), but would like to consider the role radiation therapy.   Results of the most recent follow-up CT of the chest is currently pending.     Today patient states that she is not experiencing pain, skin irritation, lymphedema, or any other issues from her adenopathy. She tells [place] that she is growing tired of continuous chemotherapy infusions with little improvement after 1.5 years. She would, however, like to continue curative therapy.   PREVIOUS RADIATION THERAPY: Yes   1993: Right breast in [place] 03/31/2013 - 05/23/2013: Left chest wall and supraclavicular region received 64.4 Gy under the care of Dr. Mitzi Hansen.   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   Diabetes mellitus    fasting 90-100   HTN (hypertension) 03/25/2013   Hx of radiation therapy    breasts hx   Hypercholesterolemia    Hypertension    Lymphedema of arm    left arm   Pulmonary embolism  (HCC)    Type II or unspecified type diabetes mellitus without mention of complication, not stated as uncontrolled 03/25/2013       PAST SURGICAL HISTORY: Past Surgical History:  Procedure Laterality Date   ABDOMINAL HYSTERECTOMY     ANKLE SURGERY Right 1995   APPENDECTOMY     BIOPSY  01/07/2018   Procedure: BIOPSY;  Surgeon: Kerin Salen, MD;  Location: [facility];  Service: Gastroenterology;;   BREAST SURGERY Bilateral    mastectomy   COLONOSCOPY WITH PROPOFOL N/A 01/07/2018   Procedure: COLONOSCOPY WITH PROPOFOL;  Surgeon: Kerin Salen, MD;  Location: [facility];  Service: Gastroenterology;  Laterality: N/A;   ESOPHAGOGASTRODUODENOSCOPY (EGD) WITH PROPOFOL N/A 01/06/2018   Procedure: ESOPHAGOGASTRODUODENOSCOPY (EGD) WITH PROPOFOL;  Surgeon: Kerin Salen, MD;  Location: [facility];  Service: Gastroenterology;  Laterality: N/A;   HERNIA REPAIR  04-26-2010   IR IMAGING GUIDED PORT INSERTION  10/29/2021   MASS EXCISION Left 01/26/2013   Procedure: EXCISION LEFT CHEST WALL MASS AND LEFT ABDOMNAL WALL MASS;  Surgeon: [name], MD;  Location: [facility];  Service: General;  Laterality: Left;   MASS EXCISION Left 09/20/2014   Procedure: EXCISION OF LEFT CHEST WALL MASS;  Surgeon: [name], MD;  Location: [facility];  Service: General;  Laterality: Left;   TEE WITHOUT CARDIOVERSION N/A 12/08/2017   Procedure: TRANSESOPHAGEAL ECHOCARDIOGRAM (TEE);  Surgeon: Lewayne Bunting, MD;  Location: [facility];  Service: Cardiovascular;  Laterality: N/A;   TOTAL HIP ARTHROPLASTY Right 01/05/2017   TOTAL HIP ARTHROPLASTY Right 01/05/2017   Procedure: TOTAL HIP ARTHROPLASTY ANTERIOR APPROACH;  Surgeon: Gean Birchwood, MD;  Location: [facility];  Service: Orthopedics;  Laterality: Right;     FAMILY HISTORY:  Family History  Problem Relation Age of Onset   Breast cancer Cousin        maternal first cousin   Uterine cancer Cousin        maternal first cousin     SOCIAL  HISTORY:  reports that she quit smoking about 50 years ago. Her smoking use included cigarettes. She has never used smokeless tobacco. She reports current alcohol use. She reports that she does not currently use drugs.   ALLERGIES: Bactrim, Lisinopril, Vasotec, and Sulfamethoxazole-trimethoprim   MEDICATIONS:  Current Outpatient Medications  Medication Sig Dispense Refill   amLODipine (NORVASC) 10 MG tablet Take 10 mg by mouth daily.     apixaban (ELIQUIS) 5 MG TABS tablet Take 1 tablet (5 mg total) by mouth 2 (two) times daily. 180 tablet 3   metFORMIN (GLUCOPHAGE) 500 MG tablet Take 500 mg by mouth daily with breakfast.     olmesartan-hydrochlorothiazide (BENICAR HCT) 40-12.5 MG tablet Take 1 tablet by mouth daily.     SM IRON 325 (65 Fe) MG tablet Take 325 mg by mouth daily.     Turmeric (QC TUMERIC COMPLEX PO) Take 1 tablet by mouth daily.     TYLENOL 325 MG tablet Take 325 mg by mouth daily.     VITAMIN D PO Take 1 tablet by mouth daily.     lidocaine-prilocaine (EMLA) cream Apply  to affected area once (Patient not taking: Reported on 07/03/2022) 30 g 3   ondansetron (ZOFRAN) 8 MG tablet Take 1 tablet (8 mg total) by mouth every 8 (eight) hours as needed for nausea or vomiting. (Patient not taking: Reported on 07/03/2022) 30 tablet 1   prochlorperazine (COMPAZINE) 10 MG tablet Take 1 tablet (10 mg total) by mouth every 6 (six) hours as needed for nausea or vomiting. (Patient not taking: Reported on 07/03/2022) 30 tablet 1   No current facility-administered medications for this encounter.     REVIEW OF SYSTEMS: As per HPI     PHYSICAL EXAM:  Wt Readings from Last 3 Encounters:  09/05/22 185 lb 12.8 oz (84.3 kg)  09/04/22 183 lb 3.2 oz (83.1 kg)  08/14/22 189 lb (85.7 kg)   Temp Readings from Last 3 Encounters:  09/05/22 (!) 97.5 F (36.4 C)  09/04/22 (!) 97.3 F (36.3 C) (Temporal)  08/14/22 (!) 97 F (36.1 C) (Temporal)   BP Readings from Last 3 Encounters:  09/05/22 (!)  148/94  09/04/22 (!) 175/87  08/14/22 (!) 159/71   Pulse Readings from Last 3 Encounters:  09/05/22 77  09/04/22 78  08/14/22 81   Pain Assessment Pain Score: 0-No pain/10  In general this is a well appearing female in no acute distress. She's alert and oriented x4 and appropriate throughout the examination. Cardiopulmonary assessment is negative for acute distress and she exhibits normal effort.     ECOG = 1  0 - Asymptomatic (Fully active, able to carry on all predisease activities without restriction)  1 - Symptomatic but completely ambulatory (Restricted in physically strenuous activity but ambulatory and able to carry out work of a light or sedentary nature. For example, light housework, office work)  2 - Symptomatic, <50% in bed during the day (Ambulatory and capable of all self care but unable to carry out any work activities. Up and about more than 50% of waking hours)  3 - Symptomatic, >50% in bed, but not bedbound (Capable of only limited self-care, confined to bed or chair 50% or more of waking hours)  4 - Bedbound (Completely disabled. Cannot carry on any self-care. Totally confined to bed or chair)  5 - Death   Santiago Glad MM, Creech RH, Tormey DC, et al. [phone]). "Toxicity and response criteria of the [facility] Group". Am. Evlyn Clines. Oncol. 5 (6): 649-55    LABORATORY DATA:  Lab Results  Component Value Date   WBC 5.5 09/04/2022   HGB 10.9 (L) 09/04/2022   HCT 34.5 (L) 09/04/2022   MCV 78.1 (L) 09/04/2022   PLT 205 09/04/2022   Lab Results  Component Value Date   NA 143 09/04/2022   K 3.5 09/04/2022   CL 108 09/04/2022   CO2 28 09/04/2022   Lab Results  Component Value Date   ALT 10 09/04/2022   AST 19 09/04/2022   ALKPHOS 48 09/04/2022   BILITOT 0.5 09/04/2022      RADIOGRAPHY: No results found.     IMPRESSION/PLAN: 1. 89 y.o. female with history of estrogen, progresterone, and HER2 positive right breast cancer recurrence on  Kadcyla.   Follow-up scans have shown persistent left axillary, subpectoral, and subcarinal adenopathy. Most recent imaging is yet to be officially interpreted. Patient is discouraged by the amount of chemotherapy infusions she has undergone without significant improvement and inquired about alternative curative treatment options. She has previously received radiation to her left chest wall. Given this and the location of her  adenopathy, Dr. Mitzi Hansen does not believe a curative dose of treatment is possible.   We had a lengthy discussion with the patient about the risks and benefits at this time. She understands that the treatment would be palliative and would like to continue to pursue curative treatment at this time. All questions were answered. We will await the final read of the most recent imaging and refer care back to Dr. Al Pimple. We will gladly reassess the patient if her goals of care change in the future.   In a visit lasting 45 minutes, greater than 50% of the time was spent face to face discussing the patient's condition, in preparation for the discussion, and coordinating the patient's care.   The above documentation reflects my direct findings during this shared patient visit. Please see the separate note by Dr. Mitzi Hansen on this date for the remainder of the patient's plan of care.    Joyice Faster, PA-C    **Disclaimer: This note was dictated with voice recognition software. Similar sounding words can inadvertently be transcribed and this note may contain transcription errors which may not have been corrected upon publication of note.**

## 2022-09-09 ENCOUNTER — Ambulatory Visit: Payer: Medicare Other | Admitting: Radiation Oncology

## 2022-09-09 ENCOUNTER — Ambulatory Visit: Payer: Medicare Other

## 2022-09-15 ENCOUNTER — Ambulatory Visit (HOSPITAL_COMMUNITY)
Admission: RE | Admit: 2022-09-15 | Discharge: 2022-09-15 | Disposition: A | Discharge status: Home / Self Care | Payer: Medicare Other | Source: Ambulatory Visit | Attending: Hematology and Oncology | Admitting: Hematology and Oncology

## 2022-09-15 DIAGNOSIS — E041 Nontoxic single thyroid nodule: Secondary | ICD-10-CM | POA: Diagnosis present

## 2022-09-25 ENCOUNTER — Other Ambulatory Visit: Payer: Medicare Other

## 2022-09-25 ENCOUNTER — Inpatient Hospital Stay: Payer: Medicare Other

## 2022-09-25 ENCOUNTER — Inpatient Hospital Stay: Payer: Medicare Other | Admitting: Hematology and Oncology

## 2022-09-25 ENCOUNTER — Other Ambulatory Visit: Payer: Self-pay

## 2022-09-25 ENCOUNTER — Inpatient Hospital Stay: Payer: Medicare Other | Attending: Adult Health

## 2022-09-25 VITALS — BP 136/55 | HR 98 | Resp 17

## 2022-09-25 VITALS — BP 155/79 | HR 84 | Temp 97.3°F | Resp 18 | Ht 67.0 in | Wt 181.9 lb

## 2022-09-25 DIAGNOSIS — C50312 Malignant neoplasm of lower-inner quadrant of left female breast: Secondary | ICD-10-CM | POA: Diagnosis not present

## 2022-09-25 DIAGNOSIS — Z170 Estrogen receptor positive status [ER+]: Secondary | ICD-10-CM

## 2022-09-25 DIAGNOSIS — G629 Polyneuropathy, unspecified: Secondary | ICD-10-CM | POA: Insufficient documentation

## 2022-09-25 DIAGNOSIS — Z5112 Encounter for antineoplastic immunotherapy: Secondary | ICD-10-CM | POA: Insufficient documentation

## 2022-09-25 DIAGNOSIS — C50919 Malignant neoplasm of unspecified site of unspecified female breast: Secondary | ICD-10-CM

## 2022-09-25 LAB — CBC WITH DIFFERENTIAL (CANCER CENTER ONLY)
Abs Immature Granulocytes: 0.02 10*3/uL (ref 0.00–0.07)
Basophils Absolute: 0 10*3/uL (ref 0.0–0.1)
Basophils Relative: 1 %
Eosinophils Absolute: 0.1 10*3/uL (ref 0.0–0.5)
Eosinophils Relative: 2 %
HCT: 36.5 % (ref 36.0–46.0)
Hemoglobin: 11.5 g/dL — ABNORMAL LOW (ref 12.0–15.0)
Immature Granulocytes: 0 %
Lymphocytes Relative: 25 %
Lymphs Abs: 1.5 10*3/uL (ref 0.7–4.0)
MCH: 24.4 pg — ABNORMAL LOW (ref 26.0–34.0)
MCHC: 31.5 g/dL (ref 30.0–36.0)
MCV: 77.5 fL — ABNORMAL LOW (ref 80.0–100.0)
Monocytes Absolute: 0.5 10*3/uL (ref 0.1–1.0)
Monocytes Relative: 7 %
Neutro Abs: 4 10*3/uL (ref 1.7–7.7)
Neutrophils Relative %: 65 %
Platelet Count: 191 10*3/uL (ref 150–400)
RBC: 4.71 MIL/uL (ref 3.87–5.11)
RDW: 17.4 % — ABNORMAL HIGH (ref 11.5–15.5)
WBC Count: 6.2 10*3/uL (ref 4.0–10.5)
nRBC: 0 % (ref 0.0–0.2)

## 2022-09-25 LAB — CMP (CANCER CENTER ONLY)
ALT: 10 U/L (ref 0–44)
AST: 19 U/L (ref 15–41)
Albumin: 3.8 g/dL (ref 3.5–5.0)
Alkaline Phosphatase: 49 U/L (ref 38–126)
Anion gap: 10 (ref 5–15)
BUN: 16 mg/dL (ref 8–23)
CO2: 26 mmol/L (ref 22–32)
Calcium: 9.5 mg/dL (ref 8.9–10.3)
Chloride: 102 mmol/L (ref 98–111)
Creatinine: 0.67 mg/dL (ref 0.44–1.00)
GFR, Estimated: >60 mL/min (ref >60)
Glucose, Bld: 192 mg/dL — ABNORMAL HIGH (ref 70–99)
Potassium: 3.6 mmol/L (ref 3.5–5.1)
Sodium: 138 mmol/L (ref 135–145)
Total Bilirubin: 0.5 mg/dL (ref 0.3–1.2)
Total Protein: 7.5 g/dL (ref 6.5–8.1)

## 2022-09-25 MED ORDER — ACETAMINOPHEN 325 MG PO TABS
650.0000 mg | ORAL_TABLET | Freq: Once | ORAL | Status: AC
  Administered 2022-09-25: 650 mg via ORAL
  Filled 2022-09-25: qty 2

## 2022-09-25 MED ORDER — PROCHLORPERAZINE MALEATE 10 MG PO TABS
10.0000 mg | ORAL_TABLET | Freq: Once | ORAL | Status: AC
  Administered 2022-09-25: 10 mg via ORAL
  Filled 2022-09-25: qty 1

## 2022-09-25 MED ORDER — SODIUM CHLORIDE 0.9% FLUSH
10.0000 mL | INTRAVENOUS | Status: DC | PRN
  Administered 2022-09-25: 10 mL

## 2022-09-25 MED ORDER — DIPHENHYDRAMINE HCL 25 MG PO CAPS
50.0000 mg | ORAL_CAPSULE | Freq: Once | ORAL | Status: AC
  Administered 2022-09-25: 50 mg via ORAL
  Filled 2022-09-25: qty 2

## 2022-09-25 MED ORDER — HEPARIN SOD (PORK) LOCK FLUSH 100 UNIT/ML IV SOLN
500.0000 [IU] | Freq: Once | INTRAVENOUS | Status: AC | PRN
  Administered 2022-09-25: 500 [IU]

## 2022-09-25 MED ORDER — SODIUM CHLORIDE 0.9 % IV SOLN
3.6000 mg/kg | Freq: Once | INTRAVENOUS | Status: AC
  Administered 2022-09-25: 320 mg via INTRAVENOUS
  Filled 2022-09-25: qty 16

## 2022-09-25 MED ORDER — SODIUM CHLORIDE 0.9 % IV SOLN: Freq: Once | INTRAVENOUS | Status: AC

## 2022-09-25 NOTE — Patient Instructions (Signed)
[facility]  Discharge Instructions: Thank you for choosing [facility] to provide your oncology and hematology care.   If you have a lab appointment with the Cancer Center, please go directly to the Cancer Center and check in at the registration area.   Wear comfortable clothing and clothing appropriate for easy access to any Portacath or PICC line.   We strive to give you quality time with your provider. You may need to reschedule your appointment if you arrive late (15 or more minutes).  Arriving late affects you and other patients whose appointments are after yours.  Also, if you miss three or more appointments without notifying the office, you may be dismissed from the clinic at the provider's discretion.      For prescription refill requests, have your pharmacy contact our office and allow 72 hours for refills to be completed.    Today you received the following chemotherapy and/or immunotherapy agents: Kadcyla.       To help prevent nausea and vomiting after your treatment, we encourage you to take your nausea medication as directed.  BELOW ARE SYMPTOMS THAT SHOULD BE REPORTED IMMEDIATELY: *FEVER GREATER THAN 100.4 F (38 C) OR HIGHER *CHILLS OR SWEATING *NAUSEA AND VOMITING THAT IS NOT CONTROLLED WITH YOUR NAUSEA MEDICATION *UNUSUAL SHORTNESS OF BREATH *UNUSUAL BRUISING OR BLEEDING *URINARY PROBLEMS (pain or burning when urinating, or frequent urination) *BOWEL PROBLEMS (unusual diarrhea, constipation, pain near the anus) TENDERNESS IN MOUTH AND THROAT WITH OR WITHOUT PRESENCE OF ULCERS (sore throat, sores in mouth, or a toothache) UNUSUAL RASH, SWELLING OR PAIN  UNUSUAL VAGINAL DISCHARGE OR ITCHING   Items with * indicate a potential emergency and should be followed up as soon as possible or go to the Emergency Department if any problems should occur.  Please show the CHEMOTHERAPY ALERT CARD or IMMUNOTHERAPY ALERT CARD at  check-in to the Emergency Department and triage nurse.  Should you have questions after your visit or need to cancel or reschedule your appointment, please contact [facility]  Dept: [phone]  and follow the prompts.  Office hours are 8:00 a.m. to 4:30 p.m. Monday - Friday. Please note that voicemails left after 4:00 p.m. may not be returned until the following business day.  We are closed weekends and major holidays. You have access to a nurse at all times for urgent questions. Please call the main number to the clinic Dept: [phone] and follow the prompts.   For any non-urgent questions, you may also contact your provider using MyChart. We now offer e-Visits for anyone 18 and older to request care online for non-urgent symptoms. For details visit [email or web address].   Also download the MyChart app! Go to the app store, search "MyChart", open the app, select [facility], and log in with your MyChart username and password.   

## 2022-09-25 NOTE — Progress Notes (Signed)
[facility]    Patient Care Team: Patient, No Pcp Per as PCP - General (General Practice) Otho Darner, MD as Consulting Physician (Family Medicine) Donnelly Angelica, RN as Oncology Nurse Navigator Pershing Proud, RN as Oncology Nurse Navigator Rachel Moulds, MD as Consulting Physician (Hematology and Oncology)   Name of the patient: Tami Schmidt  [id number]  Feb 15, 1933   Date of visit: 09/25/2022   Chief Complaint/Reason for visit: toxicity check  Current Therapy: Kadcyla  ASSESSMENT & PLAN:   Patient is a 89 y.o. 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 Patient is currently on second line Kadcyla.  For first-line she received Herceptin and pertuzumab alone given her age and comorbidities.  She is doing well overall. She denies any new complaints except for ongoing fatigue. Neuropathy only bothers at night. NO change in breathing, bowels are moving well. No change in urinary habits. Most recent exam with no new disease, stable over all. We discussed about continuing kadcyla since she does not have any toxicity except for very mild neuropathy which we will continue to monitor.  Then with regards to the ultrasound thyroid we have discussed the findings and recommendation was to consider thyroid biopsy.  However given her advanced age, metastatic breast cancer, other medical comorbidities, poor understanding of her medical situation over all, we have discussed that the thyroid biopsy is not a high priority and we will continue to monitor it with ultrasound thyroid.  She is agreeable to this plan.  RTC in 3 weeks. Heme/Onc History: Oncology History  Malignant neoplasm of lower-inner quadrant of left breast in female, estrogen receptor positive (HCC)  06/16/2013 Initial Diagnosis   Malignant neoplasm of lower-inner quadrant of left breast in female,  estrogen receptor positive (HCC)   05/30/2021 - 04/03/2022 Chemotherapy   Patient is on Treatment Plan : BREAST Trastuzumab q28d      Genetic Testing   [facility] CancerNext-Expanded Panel was Negative. 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 Report date is 07/22/2021.  The CancerNext-Expanded gene panel offered by [facility] and includes sequencing, rearrangement, and RNA analysis for the following 77 genes: AIP, ALK, APC, ATM, AXIN2, BAP1, BARD1, BLM, BMPR1A, BRCA1, BRCA2, BRIP1, CDC73, CDH1, CDK4, CDKN1B, CDKN2A, CHEK2, CTNNA1, DICER1, FANCC, FH, FLCN, GALNT12, KIF1B, LZTR1, MAX, MEN1, MET, MLH1, MSH2, MSH3, MSH6, MUTYH, NBN, NF1, NF2, NTHL1, PALB2, PHOX2B, PMS2, POT1, PRKAR1A, PTCH1, PTEN, RAD51C, RAD51D, RB1, RECQL, RET, SDHA, SDHAF2, SDHB, SDHC, SDHD, SMAD4, SMARCA4, SMARCB1, SMARCE1, STK11, SUFU, TMEM127, TP53, TSC1, TSC2, VHL and XRCC2 (sequencing and deletion/duplication); EGFR, EGLN1, HOXB13, KIT, MITF, PDGFRA, POLD1, and POLE (sequencing only); EPCAM and GREM1 (deletion/duplication only).    05/01/2022 -  Chemotherapy   Patient is on Treatment Plan : BREAST ADO-Trastuzumab Emtansine (Kadcyla) q21d      Interval history-: Tami Schmidt is a 89 y.o. female with oncologic history as above seen today for toxicity check prior to Kadcyla.  She had her CT scan on 5.21, not officially read yet. She says she is getting a little tired of coming here frequently. No change in her health otherwise. No diarrhea. She says she is mentally tired. She still says she wants to continue treatment. She asks same questions about when she can get rid of it, why not surgery, why not radiation. She complains of some fire sensation in legs, both legs  at night. She says this started about 2 months ago. She wants to proceed with treatment today and hold and see if she can do radiation.  Allergies  Allergen Reactions   Bactrim Swelling    SWELLING OF MOUTH/FACE.    Lisinopril Swelling    SWELLING OF MOUTH/FACE.   Vasotec Swelling    SWELLING OF MOUTH/FACE.   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   Diabetes mellitus    fasting 90-100   HTN (hypertension) 03/25/2013   Hx of radiation therapy    breasts hx   Hypercholesterolemia    Hypertension    Lymphedema of arm    left arm   Pulmonary embolism (HCC)    Type II or unspecified type diabetes mellitus without mention of complication, not stated as uncontrolled 03/25/2013     Past Surgical History:  Procedure Laterality Date   ABDOMINAL HYSTERECTOMY     ANKLE SURGERY Right 1995   APPENDECTOMY     BIOPSY  01/07/2018   Procedure: BIOPSY;  Surgeon: Kerin Salen, MD;  Location: [facility];  Service: Gastroenterology;;   BREAST SURGERY Bilateral    mastectomy   COLONOSCOPY WITH PROPOFOL N/A 01/07/2018   Procedure: COLONOSCOPY WITH PROPOFOL;  Surgeon: Kerin Salen, MD;  Location: [facility];  Service: Gastroenterology;  Laterality: N/A;   ESOPHAGOGASTRODUODENOSCOPY (EGD) WITH PROPOFOL N/A 01/06/2018   Procedure: ESOPHAGOGASTRODUODENOSCOPY (EGD) WITH PROPOFOL;  Surgeon: Kerin Salen, MD;  Location: [facility];  Service: Gastroenterology;  Laterality: N/A;   HERNIA REPAIR  04-26-2010   IR IMAGING GUIDED PORT INSERTION  10/29/2021   MASS EXCISION Left 01/26/2013   Procedure: EXCISION LEFT CHEST WALL MASS AND LEFT ABDOMNAL WALL MASS;  Surgeon: [name], MD;  Location: [facility];  Service: General;  Laterality: Left;   MASS EXCISION Left 09/20/2014   Procedure: EXCISION OF LEFT CHEST WALL MASS;  Surgeon: [name], MD;  Location: [facility];  Service: General;  Laterality: Left;   TEE WITHOUT CARDIOVERSION N/A 12/08/2017   Procedure: TRANSESOPHAGEAL ECHOCARDIOGRAM  (TEE);  Surgeon: Lewayne Bunting, MD;  Location: [facility];  Service: Cardiovascular;  Laterality: N/A;   TOTAL HIP ARTHROPLASTY Right 01/05/2017   TOTAL HIP ARTHROPLASTY Right 01/05/2017   Procedure: TOTAL HIP ARTHROPLASTY ANTERIOR APPROACH;  Surgeon: Gean Birchwood, MD;  Location: [facility];  Service: Orthopedics;  Laterality: Right;    Social History   Socioeconomic History   Marital status: Married    Spouse name: Not on file   Number of children: Not on file   Years of education: Not on file   Highest education level: Not on file  Occupational History   Occupation: retired Child psychotherapist  Tobacco Use   Smoking status: Former    Types: Cigarettes    Quit date: 04/14/1972    Years since quitting: 50.4   Smokeless tobacco: Never  Vaping Use   Vaping Use: Never used  Substance and Sexual Activity   Alcohol use: Yes    Comment: occasional   Drug use: Not Currently   Sexual activity: Yes    Birth control/protection: Surgical  Other Topics Concern   Not on file  Social History Narrative   Not on file   Social Determinants of Health   Financial  Resource Strain: Not on file  Food Insecurity: No Food Insecurity (09/05/2022)   Hunger Vital Sign    Worried About Running Out of Food in the Last Year: Never true    Ran Out of Food in the Last Year: Never true  Transportation Needs: No Transportation Needs (09/05/2022)   PRAPARE - Administrator, Civil Service (Medical): No    Lack of Transportation (Non-Medical): No  Physical Activity: Not on file  Stress: Not on file  Social Connections: Not on file  Intimate Partner Violence: Not At Risk (09/05/2022)   Humiliation, Afraid, Rape, and Kick questionnaire    Fear of Current or Ex-Partner: No    Emotionally Abused: No    Physically Abused: No    Sexually Abused: No    Family History  Problem Relation Age of Onset   Breast cancer Cousin        maternal first cousin   Uterine cancer Cousin        maternal first  cousin     Current Outpatient Medications:    amLODipine (NORVASC) 10 MG tablet, Take 10 mg by mouth daily., Disp: , Rfl:    apixaban (ELIQUIS) 5 MG TABS tablet, Take 1 tablet (5 mg total) by mouth 2 (two) times daily., Disp: 180 tablet, Rfl: 3   lidocaine-prilocaine (EMLA) cream, Apply to affected area once (Patient not taking: Reported on 07/03/2022), Disp: 30 g, Rfl: 3   metFORMIN (GLUCOPHAGE) 500 MG tablet, Take 500 mg by mouth daily with breakfast., Disp: , Rfl:    olmesartan-hydrochlorothiazide (BENICAR HCT) 40-12.5 MG tablet, Take 1 tablet by mouth daily., Disp: , Rfl:    ondansetron (ZOFRAN) 8 MG tablet, Take 1 tablet (8 mg total) by mouth every 8 (eight) hours as needed for nausea or vomiting. (Patient not taking: Reported on 07/03/2022), Disp: 30 tablet, Rfl: 1   prochlorperazine (COMPAZINE) 10 MG tablet, Take 1 tablet (10 mg total) by mouth every 6 (six) hours as needed for nausea or vomiting. (Patient not taking: Reported on 07/03/2022), Disp: 30 tablet, Rfl: 1   SM IRON 325 (65 Fe) MG tablet, Take 325 mg by mouth daily., Disp: , Rfl:    Turmeric (QC TUMERIC COMPLEX PO), Take 1 tablet by mouth daily., Disp: , Rfl:    TYLENOL 325 MG tablet, Take 325 mg by mouth daily., Disp: , Rfl:    VITAMIN D PO, Take 1 tablet by mouth daily., Disp: , Rfl:   PHYSICAL EXAM: ECOG FS:1 - Symptomatic but completely ambulatory    There were no vitals filed for this visit.   Physical Exam Vitals and nursing note reviewed.  Constitutional:      Appearance: She is well-developed. She is not ill-appearing or toxic-appearing.  HENT:     Head: Normocephalic.     Nose: Nose normal.  Eyes:     Conjunctiva/sclera: Conjunctivae normal.  Neck:     Vascular: No JVD.  Cardiovascular:     Rate and Rhythm: Rhythm irregular.     Pulses:          Radial pulses are 2+ on the right side and 2+ on the left side.  Pulmonary:     Effort: Pulmonary effort is normal.     Breath sounds: Normal breath sounds.   Chest:     Comments: Axilla exam challenging, pt can't lift her arms  Abdominal:     General: There is no distension.  Musculoskeletal:     Cervical back: Normal range of motion.  Comments: LUE lymphedema  Skin:    General: Skin is warm and dry.     Capillary Refill: Capillary refill takes less than 2 seconds.  Neurological:     Mental Status: She is oriented to person, place, and time.       LABORATORY DATA: I have reviewed the data as listed    Latest Ref Rng & Units 09/04/2022    8:36 AM 08/14/2022    8:54 AM 07/24/2022   11:37 AM  CBC  WBC 4.0 - 10.5 K/uL 5.5  5.6  6.5   Hemoglobin 12.0 - 15.0 g/dL 16.1  09.6  04.5   Hematocrit 36.0 - 46.0 % 34.5  35.2  34.7   Platelets 150 - 400 K/uL 205  205  248         Latest Ref Rng & Units 09/04/2022    8:36 AM 08/14/2022    8:54 AM 07/24/2022   11:37 AM  CMP  Glucose 70 - 99 mg/dL 409  811  914   BUN 8 - 23 mg/dL 21  20  19    Creatinine 0.44 - 1.00 mg/dL 7.82  9.56  2.13   Sodium 135 - 145 mmol/L 143  140  138   Potassium 3.5 - 5.1 mmol/L 3.5  3.7  3.7   Chloride 98 - 111 mmol/L 108  106  103   CO2 22 - 32 mmol/L 28  27  28    Calcium 8.9 - 10.3 mg/dL 9.2  9.1  9.5   Total Protein 6.5 - 8.1 g/dL 7.0  7.2  7.4   Total Bilirubin 0.3 - 1.2 mg/dL 0.5  0.5  0.4   Alkaline Phos 38 - 126 U/L 48  51  56   AST 15 - 41 U/L 19  20  16    ALT 0 - 44 U/L 10  11  9       RADIOGRAPHIC STUDIES (from last 24 hours if applicable) I have personally reviewed the radiological images as listed and agreed with the findings in the report. No results found.    Visit Diagnosis: No diagnosis found.   No orders of the defined types were placed in this encounter.   All questions were answered. The patient knows to call the clinic with any problems, questions or concerns. No barriers to learning was detected.  I have spent a total of 30 minutes minutes of face-to-face and non-face-to-face time, preparing to see the patient, obtaining and/or  reviewing separately obtained history, performing a medically appropriate examination, counseling and educating the patient, ordering tests, documenting clinical information in the electronic health record, and care coordination (communications with other health care professionals or caregivers).   Thank you for allowing me to participate in the care of this patient.    Rachel Moulds, MD Department of Hematology/Oncology [facility] at [facility] Phone: [phone]  [phone]

## 2022-09-26 ENCOUNTER — Other Ambulatory Visit: Payer: Self-pay

## 2022-09-26 LAB — CANCER ANTIGEN 27.29: CA 27.29: 44 U/mL — ABNORMAL HIGH (ref 0.0–38.6)

## 2022-10-04 ENCOUNTER — Inpatient Hospital Stay (HOSPITAL_BASED_OUTPATIENT_CLINIC_OR_DEPARTMENT_OTHER)
Admission: EM | Admit: 2022-10-04 | Discharge: 2022-10-07 | DRG: 378 | Disposition: A | Discharge status: Home / Self Care | Payer: Medicare Other | Attending: Internal Medicine | Admitting: Internal Medicine

## 2022-10-04 ENCOUNTER — Emergency Department (HOSPITAL_BASED_OUTPATIENT_CLINIC_OR_DEPARTMENT_OTHER): Payer: Medicare Other

## 2022-10-04 ENCOUNTER — Other Ambulatory Visit: Payer: Self-pay

## 2022-10-04 ENCOUNTER — Encounter (HOSPITAL_BASED_OUTPATIENT_CLINIC_OR_DEPARTMENT_OTHER): Payer: Self-pay | Admitting: Emergency Medicine

## 2022-10-04 DIAGNOSIS — T45515A Adverse effect of anticoagulants, initial encounter: Secondary | ICD-10-CM | POA: Diagnosis present

## 2022-10-04 DIAGNOSIS — Z923 Personal history of irradiation: Secondary | ICD-10-CM | POA: Diagnosis not present

## 2022-10-04 DIAGNOSIS — K2210 Ulcer of esophagus without bleeding: Secondary | ICD-10-CM | POA: Diagnosis not present

## 2022-10-04 DIAGNOSIS — Z7960 Long term (current) use of unspecified immunomodulators and immunosuppressants: Secondary | ICD-10-CM | POA: Diagnosis not present

## 2022-10-04 DIAGNOSIS — K5731 Diverticulosis of large intestine without perforation or abscess with bleeding: Secondary | ICD-10-CM | POA: Diagnosis present

## 2022-10-04 DIAGNOSIS — Z7984 Long term (current) use of oral hypoglycemic drugs: Secondary | ICD-10-CM | POA: Diagnosis not present

## 2022-10-04 DIAGNOSIS — Z881 Allergy status to other antibiotic agents status: Secondary | ICD-10-CM | POA: Diagnosis not present

## 2022-10-04 DIAGNOSIS — Z96641 Presence of right artificial hip joint: Secondary | ICD-10-CM | POA: Diagnosis present

## 2022-10-04 DIAGNOSIS — K922 Gastrointestinal hemorrhage, unspecified: Secondary | ICD-10-CM

## 2022-10-04 DIAGNOSIS — E7800 Pure hypercholesterolemia, unspecified: Secondary | ICD-10-CM | POA: Diagnosis present

## 2022-10-04 DIAGNOSIS — Z86711 Personal history of pulmonary embolism: Secondary | ICD-10-CM

## 2022-10-04 DIAGNOSIS — D6832 Hemorrhagic disorder due to extrinsic circulating anticoagulants: Secondary | ICD-10-CM | POA: Diagnosis present

## 2022-10-04 DIAGNOSIS — C50919 Malignant neoplasm of unspecified site of unspecified female breast: Secondary | ICD-10-CM | POA: Diagnosis present

## 2022-10-04 DIAGNOSIS — Z803 Family history of malignant neoplasm of breast: Secondary | ICD-10-CM

## 2022-10-04 DIAGNOSIS — Z87891 Personal history of nicotine dependence: Secondary | ICD-10-CM

## 2022-10-04 DIAGNOSIS — I959 Hypotension, unspecified: Secondary | ICD-10-CM | POA: Diagnosis not present

## 2022-10-04 DIAGNOSIS — Z7901 Long term (current) use of anticoagulants: Secondary | ICD-10-CM

## 2022-10-04 DIAGNOSIS — E119 Type 2 diabetes mellitus without complications: Secondary | ICD-10-CM

## 2022-10-04 DIAGNOSIS — Z9013 Acquired absence of bilateral breasts and nipples: Secondary | ICD-10-CM | POA: Diagnosis not present

## 2022-10-04 DIAGNOSIS — I10 Essential (primary) hypertension: Secondary | ICD-10-CM | POA: Diagnosis present

## 2022-10-04 DIAGNOSIS — Z90710 Acquired absence of both cervix and uterus: Secondary | ICD-10-CM

## 2022-10-04 DIAGNOSIS — K222 Esophageal obstruction: Secondary | ICD-10-CM | POA: Diagnosis present

## 2022-10-04 DIAGNOSIS — K449 Diaphragmatic hernia without obstruction or gangrene: Secondary | ICD-10-CM | POA: Diagnosis present

## 2022-10-04 DIAGNOSIS — D62 Acute posthemorrhagic anemia: Secondary | ICD-10-CM | POA: Diagnosis not present

## 2022-10-04 DIAGNOSIS — Z8049 Family history of malignant neoplasm of other genital organs: Secondary | ICD-10-CM

## 2022-10-04 DIAGNOSIS — Z79899 Other long term (current) drug therapy: Secondary | ICD-10-CM | POA: Diagnosis not present

## 2022-10-04 DIAGNOSIS — D696 Thrombocytopenia, unspecified: Secondary | ICD-10-CM | POA: Diagnosis not present

## 2022-10-04 DIAGNOSIS — Z853 Personal history of malignant neoplasm of breast: Secondary | ICD-10-CM | POA: Diagnosis not present

## 2022-10-04 DIAGNOSIS — D649 Anemia, unspecified: Secondary | ICD-10-CM

## 2022-10-04 DIAGNOSIS — Z888 Allergy status to other drugs, medicaments and biological substances status: Secondary | ICD-10-CM

## 2022-10-04 DIAGNOSIS — E1165 Type 2 diabetes mellitus with hyperglycemia: Secondary | ICD-10-CM | POA: Diagnosis present

## 2022-10-04 LAB — COMPREHENSIVE METABOLIC PANEL
ALT: 13 U/L (ref 0–44)
AST: 23 U/L (ref 15–41)
Albumin: 3.4 g/dL — ABNORMAL LOW (ref 3.5–5.0)
Alkaline Phosphatase: 50 U/L (ref 38–126)
Anion gap: 12 (ref 5–15)
BUN: 26 mg/dL — ABNORMAL HIGH (ref 8–23)
CO2: 25 mmol/L (ref 22–32)
Calcium: 9.1 mg/dL (ref 8.9–10.3)
Chloride: 99 mmol/L (ref 98–111)
Creatinine, Ser: 0.98 mg/dL (ref 0.44–1.00)
GFR, Estimated: 56 mL/min — ABNORMAL LOW (ref >60)
Glucose, Bld: 246 mg/dL — ABNORMAL HIGH (ref 70–99)
Potassium: 3.5 mmol/L (ref 3.5–5.1)
Sodium: 136 mmol/L (ref 135–145)
Total Bilirubin: 0.4 mg/dL (ref 0.3–1.2)
Total Protein: 6.2 g/dL — ABNORMAL LOW (ref 6.5–8.1)

## 2022-10-04 LAB — ABO/RH: ABO/RH(D): B POS

## 2022-10-04 LAB — GLUCOSE, CAPILLARY
Glucose-Capillary: 167 mg/dL — ABNORMAL HIGH (ref 70–99)
Glucose-Capillary: 133 mg/dL — ABNORMAL HIGH (ref 70–99)
Glucose-Capillary: 168 mg/dL — ABNORMAL HIGH (ref 70–99)

## 2022-10-04 LAB — CBC
HCT: 27.2 % — ABNORMAL LOW (ref 36.0–46.0)
Hemoglobin: 8.8 g/dL — ABNORMAL LOW (ref 12.0–15.0)
MCH: 24.6 pg — ABNORMAL LOW (ref 26.0–34.0)
MCHC: 32.4 g/dL (ref 30.0–36.0)
MCV: 76.2 fL — ABNORMAL LOW (ref 80.0–100.0)
Platelets: 119 10*3/uL — ABNORMAL LOW (ref 150–400)
RBC: 3.57 MIL/uL — ABNORMAL LOW (ref 3.87–5.11)
RDW: 17.5 % — ABNORMAL HIGH (ref 11.5–15.5)
WBC: 7.4 10*3/uL (ref 4.0–10.5)
nRBC: 0 % (ref 0.0–0.2)

## 2022-10-04 LAB — HEMOGLOBIN AND HEMATOCRIT, BLOOD
HCT: 27.8 % — ABNORMAL LOW (ref 36.0–46.0)
HCT: 24.1 % — ABNORMAL LOW (ref 36.0–46.0)
HCT: 23.3 % — ABNORMAL LOW (ref 36.0–46.0)
Hemoglobin: 8.9 g/dL — ABNORMAL LOW (ref 12.0–15.0)
Hemoglobin: 8.3 g/dL — ABNORMAL LOW (ref 12.0–15.0)
Hemoglobin: 7.8 g/dL — ABNORMAL LOW (ref 12.0–15.0)

## 2022-10-04 LAB — PROTIME-INR
INR: 1.6 — ABNORMAL HIGH (ref 0.8–1.2)
INR: 1.2 (ref 0.8–1.2)
Prothrombin Time: 19.1 seconds — ABNORMAL HIGH (ref 11.4–15.2)
Prothrombin Time: 15.8 seconds — ABNORMAL HIGH (ref 11.4–15.2)

## 2022-10-04 LAB — TYPE AND SCREEN

## 2022-10-04 LAB — BPAM RBC

## 2022-10-04 LAB — PREPARE RBC (CROSSMATCH)

## 2022-10-04 MED ORDER — LACTATED RINGERS IV SOLN: INTRAVENOUS | Status: DC

## 2022-10-04 MED ORDER — VITAMIN K1 10 MG/ML IJ SOLN
INTRAMUSCULAR | Status: AC
  Filled 2022-10-04: qty 1

## 2022-10-04 MED ORDER — SODIUM CHLORIDE 0.9% IV SOLUTION: Freq: Once | INTRAVENOUS | Status: DC

## 2022-10-04 MED ORDER — VITAMIN K1 10 MG/ML IJ SOLN
10.0000 mg | Freq: Once | INTRAVENOUS | Status: AC
  Administered 2022-10-04: 10 mg via INTRAVENOUS
  Filled 2022-10-04: qty 1

## 2022-10-04 MED ORDER — IOHEXOL 350 MG/ML SOLN
100.0000 mL | Freq: Once | INTRAVENOUS | Status: AC | PRN
  Administered 2022-10-04: 100 mL via INTRAVENOUS

## 2022-10-04 MED ORDER — ONDANSETRON HCL 4 MG PO TABS: 4.0000 mg | ORAL_TABLET | Freq: Four times a day (QID) | ORAL | Status: DC | PRN

## 2022-10-04 MED ORDER — ACETAMINOPHEN 325 MG PO TABS: 650.0000 mg | ORAL_TABLET | Freq: Four times a day (QID) | ORAL | Status: DC | PRN

## 2022-10-04 MED ORDER — LACTATED RINGERS IV BOLUS
1000.0000 mL | Freq: Once | INTRAVENOUS | Status: AC
  Administered 2022-10-04: 1000 mL via INTRAVENOUS

## 2022-10-04 MED ORDER — PANTOPRAZOLE SODIUM 40 MG IV SOLR
40.0000 mg | Freq: Two times a day (BID) | INTRAVENOUS | Status: DC
  Administered 2022-10-04 – 2022-10-07 (×7): 40 mg via INTRAVENOUS
  Filled 2022-10-04 (×7): qty 10

## 2022-10-04 MED ORDER — CHLORHEXIDINE GLUCONATE CLOTH 2 % EX PADS
6.0000 | MEDICATED_PAD | Freq: Every day | CUTANEOUS | Status: DC
  Administered 2022-10-05 – 2022-10-07 (×3): 6 via TOPICAL

## 2022-10-04 MED ORDER — PROTHROMBIN COMPLEX CONC HUMAN 500 UNITS IV KIT
3953.0000 [IU] | PACK | Status: AC
  Administered 2022-10-04: 3953 [IU] via INTRAVENOUS
  Filled 2022-10-04: qty 3953

## 2022-10-04 MED ORDER — ONDANSETRON HCL 4 MG/2ML IJ SOLN: 4.0000 mg | Freq: Four times a day (QID) | INTRAMUSCULAR | Status: DC | PRN

## 2022-10-04 MED ORDER — MELATONIN 3 MG PO TABS: 3.0000 mg | ORAL_TABLET | Freq: Every evening | ORAL | Status: DC | PRN

## 2022-10-04 NOTE — ED Triage Notes (Signed)
Pt presents to ED POV. Pt c/o rectal bleeding since ~2100 tonight. Pt reports that blood is bright red blood pouring out of rectum and mild lightheadedness upon standing. Pt reports that she is cancer pt. Taking eliquis

## 2022-10-04 NOTE — ED Provider Notes (Signed)
[place] EMERGENCY DEPARTMENT AT [facility] Provider Note   CSN: [id number] Arrival date & time: 10/04/22  0151     History  Chief Complaint  Patient presents with   Rectal Bleeding    Tami Schmidt is a 89 y.o. female.  89 yo F here with GI bleeding. On eliquis, last dose this AM, (for previous PE?), cancer, HTN. Had multiple large BM's with bright red blood and some clots. Some light headedness with standing up. No syncope. No skin color changes.  No pain.  No history of the same.  She had a colonoscopy back in 2019 with Dr. Pati Gallo with [facility].  No known history of colon cancer.   Rectal Bleeding      Home Medications Prior to Admission medications   Medication Sig Start Date End Date Taking? Authorizing Provider  amLODipine (NORVASC) 10 MG tablet Take 10 mg by mouth daily.    [provider]  apixaban (ELIQUIS) 5 MG TABS tablet Take 1 tablet (5 mg total) by mouth 2 (two) times daily. 08/07/21   Rachel Moulds, MD  lidocaine-prilocaine (EMLA) cream Apply to affected area once Patient not taking: Reported on 07/03/2022 04/24/22   Rachel Moulds, MD  metFORMIN (GLUCOPHAGE) 500 MG tablet Take 500 mg by mouth daily with breakfast.    [provider]  olmesartan-hydrochlorothiazide (BENICAR HCT) 40-12.5 MG tablet Take 1 tablet by mouth daily.    [provider]  ondansetron (ZOFRAN) 8 MG tablet Take 1 tablet (8 mg total) by mouth every 8 (eight) hours as needed for nausea or vomiting. Patient not taking: Reported on 07/03/2022 04/24/22   Rachel Moulds, MD  prochlorperazine (COMPAZINE) 10 MG tablet Take 1 tablet (10 mg total) by mouth every 6 (six) hours as needed for nausea or vomiting. Patient not taking: Reported on 07/03/2022 04/24/22   Rachel Moulds, MD  SM IRON 325 (65 Fe) MG tablet Take 325 mg by mouth daily. 07/22/19   [provider]  Turmeric (QC TUMERIC COMPLEX PO) Take 1 tablet by mouth daily.    [provider]   TYLENOL 325 MG tablet Take 325 mg by mouth daily. 10/26/20   [provider]  VITAMIN D PO Take 1 tablet by mouth daily.    [provider]      Allergies    Bactrim, Lisinopril, Vasotec, and Sulfamethoxazole-trimethoprim    Review of Systems   Review of Systems  Gastrointestinal:  Positive for hematochezia.    Physical Exam Updated Vital Signs BP 109/73 (BP Location: Right Arm)   Pulse 91   Temp 97.7 F (36.5 C)   Resp (!) 21   SpO2 99%  Physical Exam Vitals and nursing note reviewed.  Constitutional:      Appearance: She is well-developed.  HENT:     Head: Normocephalic and atraumatic.  Eyes:     Pupils: Pupils are equal, round, and reactive to light.  Cardiovascular:     Rate and Rhythm: Normal rate and regular rhythm.  Pulmonary:     Effort: No respiratory distress.     Breath sounds: No stridor.  Abdominal:     General: Abdomen is flat. There is no distension.  Musculoskeletal:        General: Normal range of motion.     Cervical back: Normal range of motion.  Skin:    General: Skin is warm and dry.  Neurological:     General: No focal deficit present.     Mental Status: She  is alert.     ED Results / Procedures / Treatments   Labs (all labs ordered are listed, but only abnormal results are displayed) Labs Reviewed  COMPREHENSIVE METABOLIC PANEL - Abnormal; Notable for the following components:      Result Value   Glucose, Bld 246 (*)    BUN 26 (*)    Total Protein 6.2 (*)    Albumin 3.4 (*)    GFR, Estimated 56 (*)    All other components within normal limits  CBC - Abnormal; Notable for the following components:   RBC 3.57 (*)    Hemoglobin 8.8 (*)    HCT 27.2 (*)    MCV 76.2 (*)    MCH 24.6 (*)    RDW 17.5 (*)    Platelets 119 (*)    All other components within normal limits  PROTIME-INR - Abnormal; Notable for the following components:   Prothrombin Time 19.1 (*)    INR 1.6 (*)    All other components within normal  limits  ABO/RH  PREPARE RBC (CROSSMATCH)    EKG None  Radiology CT ANGIO GI BLEED  Result Date: 10/04/2022 CLINICAL DATA:  Mesenteric ischemia, acute. Rectal bleeding. History of breast cancer. EXAM: CTA ABDOMEN AND PELVIS WITHOUT AND WITH CONTRAST TECHNIQUE: Multidetector CT imaging of the abdomen and pelvis was performed using the standard protocol during bolus administration of intravenous contrast. Multiplanar reconstructed images and MIPs were obtained and reviewed to evaluate the vascular anatomy. RADIATION DOSE REDUCTION: This exam was performed according to the departmental dose-optimization program which includes automated exposure control, adjustment of the mA and/or kV according to patient size and/or use of iterative reconstruction technique. CONTRAST:  OMNIPAQUE IOHEXOL 350 MG/ML SOLN COMPARISON:  09/02/2022. FINDINGS: VASCULAR Aorta: Normal caliber aorta without aneurysm, dissection, vasculitis or significant stenosis. Aortic atherosclerosis. 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 No dissection, stenosis, or vasculitis. SMA: Patent without evidence of aneurysm, dissection, vasculitis or significant stenosis. Renals: Both renal arteries are patent without evidence of aneurysm, dissection, vasculitis, fibromuscular dysplasia or significant stenosis. IMA: Patent without evidence of aneurysm, dissection, vasculitis or significant stenosis. Inflow: Patent without evidence of aneurysm, dissection, vasculitis or significant stenosis. Proximal Outflow: Bilateral common femoral and visualized portions of the superficial and profunda femoral arteries are patent without evidence of aneurysm, dissection, vasculitis or significant stenosis. Veins: Within normal limits. Review of the MIP images confirms the above findings. NON-VASCULAR Lower chest: The heart is enlarged and there is a trace pericardial effusion. Coronary artery  calcifications are noted. There is calcification of the mitral valve annulus. Mild subpleural fibrosis or scarring. Hepatobiliary: No focal liver abnormality is seen. Stones are present within the gallbladder. No biliary ductal dilatation. Pancreas: Unremarkable. No pancreatic ductal dilatation or surrounding inflammatory changes. Spleen: Normal in size without focal abnormality. Adrenals/Urinary Tract: The adrenal glands are stable. The kidneys enhance symmetrically. The kidneys enhance symmetrically. Subcentimeter hypodensities are present in the kidneys bilaterally which are too small to further characterize. No renal calculus or hydronephrosis. The bladder is unremarkable. Stomach/Bowel: There is a small hiatal hernia. Stomach is within normal limits. Appendix is not seen. No bowel obstruction, free air or pneumatosis. Scattered diverticula are present along the colon without evidence of diverticulitis. No acute hemorrhage is seen on noncontrast images. Pooling of of the contrast is noted in the mid descending colon which increases on delayed images in the territory of the IMA, best seen on series 8, image 37.  Lymphatic: No abdominal or pelvic lymphadenopathy. Reproductive: Status post hysterectomy. No adnexal masses. Other: There is laxity of the anterior abdominal wall. No abdominopelvic ascites. Musculoskeletal: Degenerative changes are present in the thoracolumbar spine. No acute or suspicious osseous abnormality. Total hip arthroplasty changes are present on the right. IMPRESSION: VASCULAR 1. Pooling of contrast in the mid descending colon on arterial and delayed images suggesting active hemorrhage, likely branch of the IMA. 2. Aortic atherosclerosis without evidence of aneurysm or dissection. NON-VASCULAR 1. Pooling of contrast in the mid descending colon on arterial and delayed images suggesting active hemorrhage. 2. Diverticulosis without diverticulitis. 3. Cholelithiasis. 4. Small hiatal hernia. 5.  Aortic atherosclerosis and coronary artery calcifications. Electronically Signed   By: Thornell Sartorius M.D.   On: 10/04/2022 04:26    Procedures .Critical Care  Performed by: Marily Memos, MD Authorized by: Marily Memos, MD   Critical care provider statement:    Critical care time (minutes):  30   Critical care was necessary to treat or prevent imminent or life-threatening deterioration of the following conditions:  Circulatory failure   Critical care was time spent personally by me on the following activities:  Development of treatment plan with patient or surrogate, discussions with consultants, evaluation of patient's response to treatment, examination of patient, ordering and review of laboratory studies, ordering and review of radiographic studies, ordering and performing treatments and interventions, pulse oximetry, re-evaluation of patient's condition and review of old charts     Medications Ordered in ED Medications  prothrombin complex conc human (KCENTRA) IVPB 3,953 Units (3,953 Units Intravenous New Bag/Given 10/04/22 0535)  phytonadione (VITAMIN K) 10 mg in dextrose 5 % 50 mL IVPB (0 mg Intravenous Paused 10/04/22 0535)  phytonadione (VITAMIN K) 10 MG/ML SQ injection (  Not Given 10/04/22 0544)  0.9 %  sodium chloride infusion (Manually program via Guardrails IV Fluids) (has no administration in time range)  lactated ringers bolus 1,000 mL (0 mLs Intravenous Stopped 10/04/22 0446)  iohexol (OMNIPAQUE) 350 MG/ML injection 100 mL (100 mLs Intravenous Contrast Given 10/04/22 0341)    ED Course/ Medical Decision Making/ A&P                             Medical Decision Making Amount and/or Complexity of Data Reviewed Labs: ordered. Radiology: ordered.  Risk Prescription drug management. Decision regarding hospitalization.   Patient here with active hemorrhaging seen on CT scan.  Hemoglobin dropped.  On Eliquis, reversed with Kcentra after pharmacy consultation.  Vitamin K  given as well.  Discussed with interventional radiology who did not feel an intervention was needed at this moment but they would follow along and help out if patient became unstable or if continue to bleed after reversal.  Discussed with hospitalist for admission.  Patient had an episode of large bloody bowel movement here associated with a vagal response.  She did have an episode of low blood pressure and feeling bad.  She recovered from this without any intervention however with the continued bleeding the significant hemoglobin drop and then now becoming more symptomatic I did give her one of our 2 units of uncrossed match blood. Dr. Bosie Clos with [facility] consulted for assistance per institutional protocol.    Final Clinical Impression(s) / ED Diagnoses Final diagnoses:  Gastrointestinal hemorrhage, unspecified gastrointestinal hemorrhage type  Anemia, unspecified type    Rx / DC Orders ED Discharge Orders     None  Millan Legan, Barbara Cower, MD 10/04/22 [phone]

## 2022-10-04 NOTE — H&P (Addendum)
History and Physical    Patient: Tami Schmidt VQQ:[id number] DOB: 11-26-1932 DOA: 10/04/2022 DOS: the patient was seen and examined on 10/04/2022 PCP: Patient, No Pcp Per  Patient coming from: Home  Chief Complaint:  Chief Complaint  Patient presents with   Rectal Bleeding   HPI:   Tami Schmidt is a 89 y.o. female with medical history significant of breast cancer status post radiation and undergoing chemotherapy, history of pulmonary embolism (Jan 2023) on Xarelto, GI bleed, type 2 diabetes mellitus, and hypertension. She presented to [facility] ED early this morning after having multiple episodes of hematochezia with associated lightheadedness/dizziness. She denies dyspnea, palpitations, abdominal pain, nausea, vomiting, or chest pain. She denies use of NSAIDs.   Vitals signs have been overall stable with BP running soft. In the ED her HGB was noted to be 8.8 (recent baseline ~11) BUN 26, PT 19.1, INR 1.6. CT Angio GIB shows active hemorrhage of likely IMA origin. She was given Kcentra, Vitmain K, and 1U PRBC in ED.  Last endoluminal evaluation via EGD and colonoscopy were in Sept 2019 which showed low-grade narrowing Schatzi ring, 4 cm hiatal hernia, LA grade A esophagitis, and diverticulosis. Multiple polyps were removed.  Vitals:   10/04/22 0530 10/04/22 0532 10/04/22 0542 10/04/22 0635  BP: 98/71 109/73  90/79  Pulse: 96 92 91   Resp: (!) 31 (!) 22 (!) 21 17  Temp:  97.7 F (36.5 C)  (!) 97.5 F (36.4 C)  TempSrc:    Oral  SpO2: 100% 100% 99% 100%  Weight:    81.4 kg  Height:    5\' 7"  (1.702 m)    Review of Systems: As mentioned in the history of present illness. All other systems reviewed and are negative. 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   Diabetes mellitus    fasting 90-100   HTN (hypertension) 03/25/2013   Hx of  radiation therapy    breasts hx   Hypercholesterolemia    Hypertension    Lymphedema of arm    left arm   Pulmonary embolism (HCC)    Type II or unspecified type diabetes mellitus without mention of complication, not stated as uncontrolled 03/25/2013   Past Surgical History:  Procedure Laterality Date   ABDOMINAL HYSTERECTOMY     ANKLE SURGERY Right 1995   APPENDECTOMY     BIOPSY  01/07/2018   Procedure: BIOPSY;  Surgeon: Kerin Salen, MD;  Location: [facility];  Service: Gastroenterology;;   BREAST SURGERY Bilateral    mastectomy   COLONOSCOPY WITH PROPOFOL N/A 01/07/2018   Procedure: COLONOSCOPY WITH PROPOFOL;  Surgeon: Kerin Salen, MD;  Location: [facility];  Service: Gastroenterology;  Laterality: N/A;   ESOPHAGOGASTRODUODENOSCOPY (EGD) WITH PROPOFOL N/A 01/06/2018   Procedure: ESOPHAGOGASTRODUODENOSCOPY (EGD) WITH PROPOFOL;  Surgeon: Kerin Salen, MD;  Location: [facility];  Service: Gastroenterology;  Laterality: N/A;   HERNIA REPAIR  04-26-2010   IR IMAGING GUIDED PORT INSERTION  10/29/2021   MASS EXCISION Left 01/26/2013   Procedure: EXCISION LEFT CHEST WALL MASS AND LEFT ABDOMNAL WALL MASS;  Surgeon: [name], MD;  Location: [facility] OR;  Service: General;  Laterality: Left;   MASS EXCISION Left 09/20/2014   Procedure: EXCISION OF LEFT CHEST WALL MASS;  Surgeon: [name], MD;  Location: [facility];  Service:  General;  Laterality: Left;   TEE WITHOUT CARDIOVERSION N/A 12/08/2017   Procedure: TRANSESOPHAGEAL ECHOCARDIOGRAM (TEE);  Surgeon: Lewayne Bunting, MD;  Location: [facility];  Service: Cardiovascular;  Laterality: N/A;   TOTAL HIP ARTHROPLASTY Right 01/05/2017   TOTAL HIP ARTHROPLASTY Right 01/05/2017   Procedure: TOTAL HIP ARTHROPLASTY ANTERIOR APPROACH;  Surgeon: Gean Birchwood, MD;  Location: [facility] OR;  Service: Orthopedics;  Laterality: Right;   Social History:  reports that she quit smoking about 50 years ago. Her smoking use included  cigarettes. She has never used smokeless tobacco. She reports current alcohol use. She reports that she does not currently use drugs.  Allergies  Allergen Reactions   Bactrim Swelling    SWELLING OF MOUTH/FACE.   Lisinopril Swelling    SWELLING OF MOUTH/FACE.   Vasotec Swelling    SWELLING OF MOUTH/FACE.   Sulfamethoxazole-Trimethoprim     Other reaction(s): Unknown    Family History  Problem Relation Age of Onset   Breast cancer Cousin        maternal first cousin   Uterine cancer Cousin        maternal first cousin    Prior to Admission medications   Medication Sig Start Date End Date Taking? Authorizing Provider  amLODipine (NORVASC) 10 MG tablet Take 10 mg by mouth daily.    [provider]  apixaban (ELIQUIS) 5 MG TABS tablet Take 1 tablet (5 mg total) by mouth 2 (two) times daily. 08/07/21   Rachel Moulds, MD  lidocaine-prilocaine (EMLA) cream Apply to affected area once Patient not taking: Reported on 07/03/2022 04/24/22   Rachel Moulds, MD  metFORMIN (GLUCOPHAGE) 500 MG tablet Take 500 mg by mouth daily with breakfast.    [provider]  olmesartan-hydrochlorothiazide (BENICAR HCT) 40-12.5 MG tablet Take 1 tablet by mouth daily.    [provider]  ondansetron (ZOFRAN) 8 MG tablet Take 1 tablet (8 mg total) by mouth every 8 (eight) hours as needed for nausea or vomiting. Patient not taking: Reported on 07/03/2022 04/24/22   Rachel Moulds, MD  prochlorperazine (COMPAZINE) 10 MG tablet Take 1 tablet (10 mg total) by mouth every 6 (six) hours as needed for nausea or vomiting. Patient not taking: Reported on 07/03/2022 04/24/22   Rachel Moulds, MD  SM IRON 325 (65 Fe) MG tablet Take 325 mg by mouth daily. 07/22/19   [provider]  Turmeric (QC TUMERIC COMPLEX PO) Take 1 tablet by mouth daily.    [provider]  TYLENOL 325 MG tablet Take 325 mg by mouth daily. 10/26/20   [provider]  VITAMIN D PO Take 1 tablet by  mouth daily.    [provider]    Physical Exam: Vitals:   10/04/22 0530 10/04/22 0532 10/04/22 0542 10/04/22 0635  BP: 98/71 109/73  90/79  Pulse: 96 92 91   Resp: (!) 31 (!) 22 (!) 21 17  Temp:  97.7 F (36.5 C)  (!) 97.5 F (36.4 C)  TempSrc:    Oral  SpO2: 100% 100% 99% 100%  Weight:    81.4 kg  Height:    5\' 7"  (1.702 m)   Constitutional: NAD, calm, comfortable Eyes: PERRL, lids and conjunctivae normal ENMT: Mucous membranes are moist. Posterior pharynx clear of any exudate or lesions.  Neck: normal, supple, no masses, no thyromegaly Respiratory: clear to auscultation bilaterally, no wheezing, no crackles. Normal respiratory effort. No accessory muscle use.  Cardiovascular: Regular rate and rhythm, no murmurs / rubs / gallops.  No extremity edema. 2+ radial/pedal pulses. No carotid bruits.  Abdomen: no tenderness, non-distended. Bowel sounds positive x4 quadrants.  Musculoskeletal: no clubbing / cyanosis. No joint deformity upper and lower extremities. Good ROM, no contractures. Normal muscle tone.  Skin: no rashes, lesions, ulcers. No induration Neurologic: CN 2-12 grossly intact. Sensation intact.  Strength 4/5 x all 4 extremities.  Psychiatric: Normal judgment and insight. Alert and oriented x 3. Normal mood.   Data Reviewed:  CBC    Component Value Date/Time   WBC 7.4 10/04/2022 0242   RBC 3.57 (L) 10/04/2022 0242   HGB 8.8 (L) 10/04/2022 0242   HGB 11.5 (L) 09/25/2022 1015   HGB 10.5 (L) 01/20/2017 1057   HCT 27.2 (L) 10/04/2022 0242   HCT 25.8 (L) 01/05/2018 0513   HCT 32.8 (L) 01/20/2017 1057   PLT 119 (L) 10/04/2022 0242   PLT 191 09/25/2022 1015   PLT 368 01/20/2017 1057   MCV 76.2 (L) 10/04/2022 0242   MCV 77.8 (L) 01/20/2017 1057   MCH 24.6 (L) 10/04/2022 0242   MCHC 32.4 10/04/2022 0242   RDW 17.5 (H) 10/04/2022 0242   RDW 16.0 (H) 01/20/2017 1057   LYMPHSABS 1.5 09/25/2022 1015   LYMPHSABS 1.3 01/20/2017 1057   MONOABS 0.5 09/25/2022  1015   MONOABS 0.4 01/20/2017 1057   EOSABS 0.1 09/25/2022 1015   EOSABS 0.1 01/20/2017 1057   BASOSABS 0.0 09/25/2022 1015   BASOSABS 0.1 01/20/2017 1057   CMP     Component Value Date/Time   NA 136 10/04/2022 0242   NA 139 01/20/2017 1057   K 3.5 10/04/2022 0242   K 3.9 01/20/2017 1057   CL 99 10/04/2022 0242   CO2 25 10/04/2022 0242   CO2 25 01/20/2017 1057   GLUCOSE 246 (H) 10/04/2022 0242   GLUCOSE 135 01/20/2017 1057   BUN 26 (H) 10/04/2022 0242   BUN 15.6 01/20/2017 1057   CREATININE 0.98 10/04/2022 0242   CREATININE 0.67 09/25/2022 1015   CREATININE 0.7 01/20/2017 1057   CALCIUM 9.1 10/04/2022 0242   CALCIUM 9.3 01/20/2017 1057   PROT 6.2 (L) 10/04/2022 0242   PROT 7.1 01/20/2017 1057   ALBUMIN 3.4 (L) 10/04/2022 0242   ALBUMIN 3.4 (L) 01/20/2017 1057   AST 23 10/04/2022 0242   AST 19 09/25/2022 1015   AST 10 01/20/2017 1057   ALT 13 10/04/2022 0242   ALT 10 09/25/2022 1015   ALT 14 01/20/2017 1057   ALKPHOS 50 10/04/2022 0242   ALKPHOS 69 01/20/2017 1057   BILITOT 0.4 10/04/2022 0242   BILITOT 0.5 09/25/2022 1015   BILITOT 0.43 01/20/2017 1057   EGFR 89 (L) 01/20/2017 1057   GFRNONAA 56 (L) 10/04/2022 0242   GFRNONAA >60 09/25/2022 1015   PT/INR    Component Value Date/Time   Prothrombin Time INR 19.1 1.6 10/04/2022 0246 10/04/2022 0246   CT ANGIO GI BLEED  Result Date: 10/04/2022 CLINICAL DATA:  Mesenteric ischemia, acute. Rectal bleeding. History of breast cancer. EXAM: CTA ABDOMEN AND PELVIS WITHOUT AND WITH CONTRAST TECHNIQUE: Multidetector CT imaging of the abdomen and pelvis was performed using the standard protocol during bolus administration of intravenous contrast. Multiplanar reconstructed images and MIPs were obtained and reviewed to evaluate the vascular anatomy. RADIATION DOSE REDUCTION: This exam was performed according to the departmental dose-optimization program which includes automated exposure control, adjustment of the mA and/or kV  according to patient size and/or use of iterative reconstruction technique. CONTRAST:  OMNIPAQUE IOHEXOL 350 MG/ML SOLN  COMPARISON:  09/02/2022. FINDINGS: VASCULAR Aorta: Normal caliber aorta without aneurysm, dissection, vasculitis or significant stenosis. Aortic atherosclerosis. 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 No dissection, stenosis, or vasculitis. SMA: Patent without evidence of aneurysm, dissection, vasculitis or significant stenosis. Renals: Both renal arteries are patent without evidence of aneurysm, dissection, vasculitis, fibromuscular dysplasia or significant stenosis. IMA: Patent without evidence of aneurysm, dissection, vasculitis or significant stenosis. Inflow: Patent without evidence of aneurysm, dissection, vasculitis or significant stenosis. Proximal Outflow: Bilateral common femoral and visualized portions of the superficial and profunda femoral arteries are patent without evidence of aneurysm, dissection, vasculitis or significant stenosis. Veins: Within normal limits. Review of the MIP images confirms the above findings. NON-VASCULAR Lower chest: The heart is enlarged and there is a trace pericardial effusion. Coronary artery calcifications are noted. There is calcification of the mitral valve annulus. Mild subpleural fibrosis or scarring. Hepatobiliary: No focal liver abnormality is seen. Stones are present within the gallbladder. No biliary ductal dilatation. Pancreas: Unremarkable. No pancreatic ductal dilatation or surrounding inflammatory changes. Spleen: Normal in size without focal abnormality. Adrenals/Urinary Tract: The adrenal glands are stable. The kidneys enhance symmetrically. The kidneys enhance symmetrically. Subcentimeter hypodensities are present in the kidneys bilaterally which are too small to further characterize. No renal calculus or hydronephrosis. The bladder is unremarkable. Stomach/Bowel: There is a  small hiatal hernia. Stomach is within normal limits. Appendix is not seen. No bowel obstruction, free air or pneumatosis. Scattered diverticula are present along the colon without evidence of diverticulitis. No acute hemorrhage is seen on noncontrast images. Pooling of of the contrast is noted in the mid descending colon which increases on delayed images in the territory of the IMA, best seen on series 8, image 37. Lymphatic: No abdominal or pelvic lymphadenopathy. Reproductive: Status post hysterectomy. No adnexal masses. Other: There is laxity of the anterior abdominal wall. No abdominopelvic ascites. Musculoskeletal: Degenerative changes are present in the thoracolumbar spine. No acute or suspicious osseous abnormality. Total hip arthroplasty changes are present on the right. IMPRESSION: VASCULAR 1. Pooling of contrast in the mid descending colon on arterial and delayed images suggesting active hemorrhage, likely branch of the IMA. 2. Aortic atherosclerosis without evidence of aneurysm or dissection. NON-VASCULAR 1. Pooling of contrast in the mid descending colon on arterial and delayed images suggesting active hemorrhage. 2. Diverticulosis without diverticulitis. 3. Cholelithiasis. 4. Small hiatal hernia. 5. Aortic atherosclerosis and coronary artery calcifications. Electronically Signed   By: Thornell Sartorius M.D.   On: 10/04/2022 04:26     Assessment and Plan: #Gastrointestinal Bleeding 1 episode of hematochezia since arrival to [facility] at 0630. Received 1U PRBCs, Vitamin K, and Kcentra in ED. - Hold home Eliquis -Trend H&H, transfuse for HGB > 8 or hemodynamic instability - PPI - Recheck PT/INR, correct coagulopathy - Discussed with IR Dr McCullough--> monitor for now. Reach out if bleeding becomes brisk. - [facility] Dr Bosie Clos consulted, will see this afternoon, appreciate their recommendations.  - Clear liquid Diet  #Thrombocytopenia PLTs 119 on AM CBC - Monitor, transfuse for goal > 75K if she  continues to bleed.  # Essential Hypertension BP 103/54 MAP 68 - Hold home antihypertensives in setting of active bleeding and pending clinical course   #Type II Diabetes Mellitus without Complication - AC/HS CBG - SSI as needed   VTE prophylaxis: SCDs GI prophylaxis: Protonix Diet: NPO Access: PIV Lines: (R) chest port Telemetry: Yes, at least 24 hrs Disposition: Observation, Progressive  Advance Care Planning: Code Status: FULL  Consults: IR, Gastroenterology  Family Communication: Patient declined  Severity of Illness: The appropriate patient status for this patient is INPATIENT. Inpatient status is judged to be reasonable and necessary in order to provide the required intensity of service to ensure the patient's safety. The patient's presenting symptoms, physical exam findings, and initial radiographic and laboratory data in the context of their chronic comorbidities is felt to place them at high risk for further clinical deterioration. Furthermore, it is not anticipated that the patient will be medically stable for discharge from the hospital within 2 midnights of admission.   * I certify that at the point of admission it is my clinical judgment that the patient will require inpatient hospital care spanning beyond 2 midnights from the point of admission due to high intensity of service, high risk for further deterioration and high frequency of surveillance required.*  To reach the provider On-Call:   7AM- 7PM see care teams to locate the attending and reach out to them via [email or web address] 7PM-7AM contact night-coverage If you still have difficulty reaching the appropriate provider, please page the [facility] (Director on Call) for [facility] on amion for assistance  This document was prepared using Sales executive software and may include unintentional dictation errors.  Bishop Limbo FNP-BC, PMHNP-BC Nurse Practitioner [facility]     PROGRESS NOTE    Tami Schmidt  MVH:[id number] DOB: 16-Oct-1932 DOA: 10/04/2022 PCP: Patient, No Pcp Per   Brief Narrative:  Agree with the above assessment and plan.  Briefly Ms. Plaugher was admitted as above with recurrent hematochezia, symptomatic anemia with dizziness lightheadedness near syncope.  Patient has prior history of GI bleed.  Hemoglobin at this time, while lower than prior, remains above 7.  Patient did receive transfusion in the setting of profound symptoms.  CT exam head intake confirmed pooling of contrast in the mid descending colon, consistent with ongoing bleeding.  GI as well as IR have been consulted at intake.  Home Eliquis discontinued, patient already received 1 unit PRBC vitamin K and Kcentra in the ED.  Follow clinically, low threshold to transfuse additional unit PRBC if symptoms worsen.  Clear diet in the interim until GI and IR can evaluate patient for potential treatment courses.  Time spent:  Azucena Fallen, DO [facility]  If 7PM-7AM, please contact night-coverage [email or web address]  10/04/2022, 3:52 PM

## 2022-10-04 NOTE — Consult Note (Addendum)
Referring Provider: Dr. Clayborne Dana Primary Care Physician:  Patient, No Pcp Per Primary Gastroenterologist:  Dr. Marca Ancona  Reason for Consultation:  GI Bleed  HPI: Tami Schmidt with met breast cancer on chemotherapy is a 89 y.o. female with acute onset of bright red blood per rectum that occurred multiple times early this morning with dizziness without associated N/V/abdominal pain. CT angio POS for active hemorrhage in the mid-descending colon thought to be from a branch of the IMA. On Eliquis that was reversed with KCentra and Vitamin K. Has had one episode of bleeding since admit. EGD/colon 2019 (Dr. Marca Ancona) with diffuse colonic diverticulosis and several benign non-adenomatous polyps were removed. EGD showed mild erosive esophagitis, distal esophageal ring and a hiatal hernia. Hgb 8.3 (8.8 on admit yesterday; 11.5 on 09/25/22).   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   Diabetes mellitus    fasting 90-100   HTN (hypertension) 03/25/2013   Hx of radiation therapy    breasts hx   Hypercholesterolemia    Hypertension    Lymphedema of arm    left arm   Pulmonary embolism (HCC)    Type II or unspecified type diabetes mellitus without mention of complication, not stated as uncontrolled 03/25/2013    Past Surgical History:  Procedure Laterality Date   ABDOMINAL HYSTERECTOMY     ANKLE SURGERY Right 1995   APPENDECTOMY     BIOPSY  01/07/2018   Procedure: BIOPSY;  Surgeon: Kerin Salen, MD;  Location: [facility];  Service: Gastroenterology;;   BREAST SURGERY Bilateral    mastectomy   COLONOSCOPY WITH PROPOFOL N/A 01/07/2018   Procedure: COLONOSCOPY WITH PROPOFOL;  Surgeon: Kerin Salen, MD;  Location: [facility];  Service: Gastroenterology;  Laterality: N/A;   ESOPHAGOGASTRODUODENOSCOPY (EGD) WITH PROPOFOL N/A 01/06/2018   Procedure:  ESOPHAGOGASTRODUODENOSCOPY (EGD) WITH PROPOFOL;  Surgeon: Kerin Salen, MD;  Location: [facility];  Service: Gastroenterology;  Laterality: N/A;   HERNIA REPAIR  04-26-2010   IR IMAGING GUIDED PORT INSERTION  10/29/2021   MASS EXCISION Left 01/26/2013   Procedure: EXCISION LEFT CHEST WALL MASS AND LEFT ABDOMNAL WALL MASS;  Surgeon: [name], MD;  Location: [facility];  Service: General;  Laterality: Left;   MASS EXCISION Left 09/20/2014   Procedure: EXCISION OF LEFT CHEST WALL MASS;  Surgeon: [name], MD;  Location: [facility];  Service: General;  Laterality: Left;   TEE WITHOUT CARDIOVERSION N/A 12/08/2017   Procedure: TRANSESOPHAGEAL ECHOCARDIOGRAM (TEE);  Surgeon: Lewayne Bunting, MD;  Location: [facility];  Service: Cardiovascular;  Laterality: N/A;   TOTAL HIP ARTHROPLASTY Right 01/05/2017   TOTAL HIP ARTHROPLASTY Right 01/05/2017   Procedure: TOTAL HIP ARTHROPLASTY ANTERIOR APPROACH;  Surgeon: Gean Birchwood, MD;  Location: [facility];  Service: Orthopedics;  Laterality: Right;    Prior to Admission medications   Medication Sig Start Date End Date Taking? Authorizing Provider  amLODipine (NORVASC) 10 MG tablet Take 10 mg by mouth daily.    [provider]  apixaban (ELIQUIS) 5 MG TABS tablet Take 1 tablet (5 mg total) by mouth 2 (two) times daily. 08/07/21   Rachel Moulds, MD  lidocaine-prilocaine (EMLA) cream Apply to affected area once Patient not taking: Reported on 07/03/2022 04/24/22   Rachel Moulds, MD  metFORMIN (GLUCOPHAGE) 500 MG tablet Take 500 mg by mouth daily  with breakfast.    [provider]  olmesartan-hydrochlorothiazide (BENICAR HCT) 40-12.5 MG tablet Take 1 tablet by mouth daily.    [provider]  ondansetron (ZOFRAN) 8 MG tablet Take 1 tablet (8 mg total) by mouth every 8 (eight) hours as needed for nausea or vomiting. Patient not taking: Reported on 07/03/2022 04/24/22   Rachel Moulds, MD  prochlorperazine  (COMPAZINE) 10 MG tablet Take 1 tablet (10 mg total) by mouth every 6 (six) hours as needed for nausea or vomiting. Patient not taking: Reported on 07/03/2022 04/24/22   Rachel Moulds, MD  SM IRON 325 (65 Fe) MG tablet Take 325 mg by mouth daily. 07/22/19   [provider]  Turmeric (QC TUMERIC COMPLEX PO) Take 1 tablet by mouth daily.    [provider]  TYLENOL 325 MG tablet Take 325 mg by mouth daily. 10/26/20   [provider]  VITAMIN D PO Take 1 tablet by mouth daily.    [provider]    Scheduled Meds:  sodium chloride   Intravenous Once   pantoprazole (PROTONIX) IV  40 mg Intravenous Q12H   Continuous Infusions:  lactated ringers 75 mL/hr at 10/04/22 0919   PRN Meds:.acetaminophen, melatonin, ondansetron **OR** ondansetron (ZOFRAN) IV  Allergies as of 10/04/2022 - Review Complete 10/04/2022  Allergen Reaction Noted   Bactrim Swelling 06/23/2011   Lisinopril Swelling 06/23/2011   Vasotec Swelling 06/23/2011   Sulfamethoxazole-trimethoprim  06/15/2020    Family History  Problem Relation Age of Onset   Breast cancer Cousin        maternal first cousin   Uterine cancer Cousin        maternal first cousin    Social History   Socioeconomic History   Marital status: Married    Spouse name: Not on file   Number of children: Not on file   Years of education: Not on file   Highest education level: Not on file  Occupational History   Occupation: retired Child psychotherapist  Tobacco Use   Smoking status: Former    Types: Cigarettes    Quit date: 04/14/1972    Years since quitting: 50.5   Smokeless tobacco: Never  Vaping Use   Vaping Use: Never used  Substance and Sexual Activity   Alcohol use: Yes    Comment: occasional   Drug use: Not Currently   Sexual activity: Yes    Birth control/protection: Surgical  Other Topics Concern   Not on file  Social History Narrative   Not on file   Social Determinants of Health   Financial  Resource Strain: Not on file  Food Insecurity: No Food Insecurity (09/05/2022)   Hunger Vital Sign    Worried About Running Out of Food in the Last Year: Never true    Ran Out of Food in the Last Year: Never true  Transportation Needs: No Transportation Needs (09/05/2022)   PRAPARE - Administrator, Civil Service (Medical): No    Lack of Transportation (Non-Medical): No  Physical Activity: Not on file  Stress: Not on file  Social Connections: Not on file  Intimate Partner Violence: Not At Risk (09/05/2022)   Humiliation, Afraid, Rape, and Kick questionnaire    Fear of Current or Ex-Partner: No    Emotionally Abused: No    Physically Abused: No    Sexually Abused: No    Review of Systems: All negative except as stated above in HPI.  Physical Exam: Vital signs: Vitals:   10/04/22  1151 10/04/22 1627  BP: (!) 124/57 107/60  Pulse: 80 78  Resp: 20 20  Temp: (!) 97.3 F (36.3 C) 98.1 F (36.7 C)  SpO2: 96% 97%   Last BM Date : 10/04/22 General:  Lethargic, elderly, Well-developed, well-nourished, pleasant and cooperative in NAD Head: normocephalic, atraumatic Eyes: anicteric sclera ENT: oropharynx clear Neck: supple, nontender Lungs:  Clear throughout to auscultation.   No wheezes, crackles, or rhonchi. No acute distress. Heart:  Regular rate and rhythm; no murmurs, clicks, rubs,  or gallops. Abdomen: soft, nontender, nondistended, +BS  Rectal:  Deferred Ext: no edema  GI:  Lab Results: Recent Labs    10/04/22 0242 10/04/22 0929 10/04/22 1603  WBC 7.4  --   --   HGB 8.8* 8.9* 8.3*  HCT 27.2* 27.8* 24.1*  PLT 119*  --   --    BMET Recent Labs    10/04/22 0242  NA 136  K 3.5  CL 99  CO2 25  GLUCOSE 246*  BUN 26*  CREATININE 0.98  CALCIUM 9.1   LFT Recent Labs    10/04/22 0242  PROT 6.2*  ALBUMIN 3.4*  AST 23  ALT 13  ALKPHOS 50  BILITOT 0.4   PT/INR Recent Labs    10/04/22 0246 10/04/22 0929  LABPROT 19.1* 15.8*  INR 1.6* 1.2      Studies/Results: CT ANGIO GI BLEED  Result Date: 10/04/2022 CLINICAL DATA:  Mesenteric ischemia, acute. Rectal bleeding. History of breast cancer. EXAM: CTA ABDOMEN AND PELVIS WITHOUT AND WITH CONTRAST TECHNIQUE: Multidetector CT imaging of the abdomen and pelvis was performed using the standard protocol during bolus administration of intravenous contrast. Multiplanar reconstructed images and MIPs were obtained and reviewed to evaluate the vascular anatomy. RADIATION DOSE REDUCTION: This exam was performed according to the departmental dose-optimization program which includes automated exposure control, adjustment of the mA and/or kV according to patient size and/or use of iterative reconstruction technique. CONTRAST:  OMNIPAQUE IOHEXOL 350 MG/ML SOLN COMPARISON:  09/02/2022. FINDINGS: VASCULAR Aorta: Normal caliber aorta without aneurysm, dissection, vasculitis or significant stenosis. Aortic atherosclerosis. 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 No dissection, stenosis, or vasculitis. SMA: Patent without evidence of aneurysm, dissection, vasculitis or significant stenosis. Renals: Both renal arteries are patent without evidence of aneurysm, dissection, vasculitis, fibromuscular dysplasia or significant stenosis. IMA: Patent without evidence of aneurysm, dissection, vasculitis or significant stenosis. Inflow: Patent without evidence of aneurysm, dissection, vasculitis or significant stenosis. Proximal Outflow: Bilateral common femoral and visualized portions of the superficial and profunda femoral arteries are patent without evidence of aneurysm, dissection, vasculitis or significant stenosis. Veins: Within normal limits. Review of the MIP images confirms the above findings. NON-VASCULAR Lower chest: The heart is enlarged and there is a trace pericardial effusion. Coronary artery calcifications are noted. There is calcification of the  mitral valve annulus. Mild subpleural fibrosis or scarring. Hepatobiliary: No focal liver abnormality is seen. Stones are present within the gallbladder. No biliary ductal dilatation. Pancreas: Unremarkable. No pancreatic ductal dilatation or surrounding inflammatory changes. Spleen: Normal in size without focal abnormality. Adrenals/Urinary Tract: The adrenal glands are stable. The kidneys enhance symmetrically. The kidneys enhance symmetrically. Subcentimeter hypodensities are present in the kidneys bilaterally which are too small to further characterize. No renal calculus or hydronephrosis. The bladder is unremarkable. Stomach/Bowel: There is a small hiatal hernia. Stomach is within normal limits. Appendix is not seen. No bowel obstruction, free air or pneumatosis. Scattered diverticula are present along  the colon without evidence of diverticulitis. No acute hemorrhage is seen on noncontrast images. Pooling of of the contrast is noted in the mid descending colon which increases on delayed images in the territory of the IMA, best seen on series 8, image 37. Lymphatic: No abdominal or pelvic lymphadenopathy. Reproductive: Status post hysterectomy. No adnexal masses. Other: There is laxity of the anterior abdominal wall. No abdominopelvic ascites. Musculoskeletal: Degenerative changes are present in the thoracolumbar spine. No acute or suspicious osseous abnormality. Total hip arthroplasty changes are present on the right. IMPRESSION: VASCULAR 1. Pooling of contrast in the mid descending colon on arterial and delayed images suggesting active hemorrhage, likely branch of the IMA. 2. Aortic atherosclerosis without evidence of aneurysm or dissection. NON-VASCULAR 1. Pooling of contrast in the mid descending colon on arterial and delayed images suggesting active hemorrhage. 2. Diverticulosis without diverticulitis. 3. Cholelithiasis. 4. Small hiatal hernia. 5. Aortic atherosclerosis and coronary artery calcifications.  Electronically Signed   By: Thornell Sartorius M.D.   On: 10/04/2022 04:26    Impression/Plan: Painless hematochezia with positive GI angio with bleeding into the mid-descending colon likely due to diverticulosis. IR consulted by ER who is on standby to do embolization if bleeding continued after anticoagulation reversal or if hemodynamic instability developed. Would not recommend a repeat colonoscopy. Supportive care. Clear liquid diet.    LOS: 0 days   Shirley Friar  10/04/2022, 5:55 PM  Questions please call [phone]

## 2022-10-04 NOTE — Plan of Care (Signed)
  Problem: Education: Goal: Knowledge of General Education information will improve Description: Including pain rating scale, medication(s)/side effects and non-pharmacologic comfort measures Outcome: Progressing   Problem: Health Behavior/Discharge Planning: Goal: Ability to manage health-related needs will improve Outcome: Progressing   Problem: Clinical Measurements: Goal: Ability to maintain clinical measurements within normal limits will improve Outcome: Progressing   Problem: Activity: Goal: Risk for activity intolerance will decrease Outcome: Progressing   Problem: Elimination: Goal: Will not experience complications related to bowel motility Outcome: Progressing   Problem: Pain Managment: Goal: General experience of comfort will improve Outcome: Progressing   Problem: Safety: Goal: Ability to remain free from injury will improve Outcome: Progressing   

## 2022-10-04 NOTE — Progress Notes (Signed)
   10/04/22 0635  Vitals  Temp (!) 97.5 F (36.4 C)  Temp Source Oral  BP 90/79  MAP (mmHg) 85  BP Location Right Arm  BP Method Automatic  Patient Position (if appropriate) Lying  Pulse Rate Source Monitor  ECG Heart Rate 91  Resp 17  Level of Consciousness  Level of Consciousness Alert  MEWS COLOR  MEWS Score Color Green  Oxygen Therapy  SpO2 100 %  O2 Device Room Air  Pain Assessment  Pain Scale 0-10  Pain Score 0  Height and Weight  Height 5\' 7"  (1.702 m)  Weight 81.4 kg  BSA (Calculated - sq m) 1.96 sq meters  BMI (Calculated) 28.12  Weight in (lb) to have BMI = 25 159.3  MEWS Score  MEWS Temp 0  MEWS Systolic 1  MEWS Pulse 0  MEWS RR 0  MEWS LOC 0  MEWS Score 1   Pt admitted to [facility]. Pt oriented to unit. CCMD called. Call light by pt. CHG bath given.

## 2022-10-04 NOTE — Progress Notes (Signed)
   Patient Name: eilyn, polack DOB: [birth year] MRN: [id number] Transferring facility: [facility] Requesting provider: mesner, md Reason for transfer: GI bleed 89 yo AAF with hx of breast cancer, hx of PE on Eliquis, multiple episodes of BRBPR. BUN 26, HgB 8.8. CT angio shows active hemorrhage. EDP will contact [facility]. EDP going to give K-Centra. EDP will also contact IR to let them know about pt's condition. Going to: [facility] Admission Status: obs Bed Type: progressive To Do: notify IR if pt is still bleeding, serial H&Hs  [facility] will assume care on arrival to accepting facility. Until arrival, medical decision making responsibilities remain with the EDP.  However, [facility] available 24/7 for questions and assistance.   Nursing staff please page [facility] Admits and Consults [phone]) as soon as the patient arrives to the hospital.  Carollee Herter, DO [facility]

## 2022-10-05 DIAGNOSIS — K922 Gastrointestinal hemorrhage, unspecified: Secondary | ICD-10-CM | POA: Diagnosis not present

## 2022-10-05 LAB — CBC
HCT: 23 % — ABNORMAL LOW (ref 36.0–46.0)
Hemoglobin: 7.5 g/dL — ABNORMAL LOW (ref 12.0–15.0)
MCH: 25.4 pg — ABNORMAL LOW (ref 26.0–34.0)
MCHC: 32.6 g/dL (ref 30.0–36.0)
MCV: 78 fL — ABNORMAL LOW (ref 80.0–100.0)
Platelets: 106 10*3/uL — ABNORMAL LOW (ref 150–400)
RBC: 2.95 MIL/uL — ABNORMAL LOW (ref 3.87–5.11)
RDW: 17.9 % — ABNORMAL HIGH (ref 11.5–15.5)
WBC: 7.8 10*3/uL (ref 4.0–10.5)
nRBC: 0 % (ref 0.0–0.2)

## 2022-10-05 LAB — GLUCOSE, CAPILLARY
Glucose-Capillary: 120 mg/dL — ABNORMAL HIGH (ref 70–99)
Glucose-Capillary: 115 mg/dL — ABNORMAL HIGH (ref 70–99)
Glucose-Capillary: 135 mg/dL — ABNORMAL HIGH (ref 70–99)
Glucose-Capillary: 127 mg/dL — ABNORMAL HIGH (ref 70–99)
Glucose-Capillary: 99 mg/dL (ref 70–99)

## 2022-10-05 LAB — TYPE AND SCREEN
ABO/RH(D): B POS
Antibody Screen: NEGATIVE
Unit division: 0

## 2022-10-05 LAB — BASIC METABOLIC PANEL
Anion gap: 9 (ref 5–15)
BUN: 28 mg/dL — ABNORMAL HIGH (ref 8–23)
CO2: 24 mmol/L (ref 22–32)
Calcium: 8.5 mg/dL — ABNORMAL LOW (ref 8.9–10.3)
Chloride: 103 mmol/L (ref 98–111)
Creatinine, Ser: 0.82 mg/dL (ref 0.44–1.00)
GFR, Estimated: >60 mL/min (ref >60)
Glucose, Bld: 124 mg/dL — ABNORMAL HIGH (ref 70–99)
Potassium: 3.3 mmol/L — ABNORMAL LOW (ref 3.5–5.1)
Sodium: 136 mmol/L (ref 135–145)

## 2022-10-05 LAB — BPAM RBC
Blood Product Expiration Date: 202407242359
ISSUE DATE / TIME: 202406220524
Unit Type and Rh: 9500

## 2022-10-05 LAB — PROTIME-INR
INR: 1.2 (ref 0.8–1.2)
Prothrombin Time: 15.3 seconds — ABNORMAL HIGH (ref 11.4–15.2)

## 2022-10-05 NOTE — Progress Notes (Signed)
[facility] Gastroenterology Progress Note  Tami Schmidt 89 y.o. 11/28/1932   Subjective: Denies bleeding or BMs overnight. Denies abdominal pain. Feels good.  Objective: Vital signs: Vitals:   10/05/22 0356 10/05/22 0805  BP: (!) 101/56 109/61  Pulse:  68  Resp: (!) 21 20  Temp: 97.8 F (36.6 C) 98.5 F (36.9 C)  SpO2: 97% 97%    Physical Exam: Gen: lethargic, elderly, no acute distress, well-nourished HEENT: anicteric sclera CV: RRR Chest: CTA B Abd: soft, nontender, nondistended, +BS Ext: no edema  Lab Results: Recent Labs    10/04/22 0242 10/05/22 0430  NA 136 136  K 3.5 3.3*  CL 99 103  CO2 25 24  GLUCOSE 246* 124*  BUN 26* 28*  CREATININE 0.98 0.82  CALCIUM 9.1 8.5*   Recent Labs    10/04/22 0242  AST 23  ALT 13  ALKPHOS 50  BILITOT 0.4  PROT 6.2*  ALBUMIN 3.4*   Recent Labs    10/04/22 0242 10/04/22 0929 10/04/22 2245 10/05/22 0430  WBC 7.4  --   --  7.8  HGB 8.8*   < > 7.8* 7.5*  HCT 27.2*   < > 23.3* 23.0*  MCV 76.2*  --   --  78.0*  PLT 119*  --   --  106*   < > = values in this interval not displayed.      Assessment/Plan: Diverticular bleed - seems to have resolved. Soft diet. Resume Eliquis in 2-3 days if ok with primary team. Will sign off. Call if questions.   Tami Schmidt 10/05/2022, 9:23 AM  Questions please call [phone] ID: Tami Schmidt, female   DOB: June 05, 1932, 89 y.o.   MRN: [id number]

## 2022-10-05 NOTE — Progress Notes (Signed)
Pt has been comfortably resting today. Up in chair and husband came by to visit. Pt has no needs and will call. Requesting a nap at this time. Pt resting with call bell within reach.  Will continue to monitor.

## 2022-10-05 NOTE — Progress Notes (Addendum)
PROGRESS NOTE    Tami Schmidt  QIO:[id number] DOB: Dec 15, 1932 DOA: 10/04/2022 PCP: Patient, No Pcp Per   Brief Narrative:  Tami Schmidt is a 89 y.o. female with medical history significant of breast cancer status post radiation and currently undergoing chemotherapy, history of pulmonary embolism (Jan 2023) on Xarelto, previous GI bleed, type 2 diabetes mellitus, and hypertension. She presented to [facility] ED early this morning after having multiple episodes of hematochezia with associated light-headedness vs dizziness. Hospitalist called for admission, IR and GI consulted.  Assessment & Plan:   Principal Problem:   Acute GI bleeding Active Problems:   Essential hypertension   Diabetes mellitus type II, controlled, with no complications (HCC)  Acute symptomatic anemia secondary to acute recurrent gastrointestinal bleeding, presumed diverticular in origin, POA Complicated by Anticoagulation use -Status post 1 unit PRBC, vitamin K, Kcentra administered at intake -Continue to hold anticoagulation(Eliquis) -CT confirms what appears to be descending colon bleed given contrast extravasation -[facility] currently not recommending endoscopy.   -IR on hold unless bleeding becomes brisk/continues after anticoagulation held.   Acute on chronic thrombocytopenia -Platelets continue to downtrend minimally, likely hemodilutional in setting of blood and IV fluids   Essential Hypertension -Currently somewhat hypotensive, continue to hold home blood pressure medications   Type II Diabetes Mellitus poorly controlled with hyperglycemia, without Complication -A1c 6.6 earlier this year -Continue sliding scale insulin, hypoglycemic protocol AC/HS CBG  DVT prophylaxis: SCDs Start: 10/04/22 0834 Code Status:   Code Status: Full Code Family Communication: None present  Status is: Inpatient  Dispo: The patient is from: Home              Anticipated d/c is to: Home              Anticipated d/c  date is: 24 to 48 hours pending clinical course and repeat labs  Consultants:  GI, IR  Procedures:  None planned  Antimicrobials:  None indicated  Subjective: No acute issues or events overnight denies nausea vomiting diarrhea constipation headache fevers chills or chest pain.  Objective: Vitals:   10/04/22 1627 10/04/22 2016 10/04/22 2325 10/05/22 0356  BP: 107/60 105/62 (!) 108/59 (!) 101/56  Pulse: 78     Resp: 20 20 18  (!) 21  Temp: 98.1 F (36.7 C) 98.1 F (36.7 C) 98 F (36.7 C) 97.8 F (36.6 C)  TempSrc: Oral Oral Oral Oral  SpO2: 97% 96% 98% 97%  Weight:      Height:        Intake/Output Summary (Last 24 hours) at 10/05/2022 0656 Last data filed at 10/04/2022 2250 Gross per 24 hour  Intake 1438.38 ml  Output --  Net 1438.38 ml   Filed Weights   10/04/22 0635  Weight: 81.4 kg    Examination:  General:  Pleasantly resting in bed, No acute distress. HEENT:  Normocephalic atraumatic.  Sclerae nonicteric, noninjected.  Extraocular movements intact bilaterally. Neck:  Without mass or deformity.  Trachea is midline. Lungs:  Clear to auscultate bilaterally without rhonchi, wheeze, or rales. Heart:  Regular rate and rhythm.  Without murmurs, rubs, or gallops. Abdomen:  Soft, nontender, nondistended.  Without guarding or rebound. Extremities: Without cyanosis, clubbing, edema, or obvious deformity. Skin:  Warm and dry, no erythema.   Data Reviewed: I have personally reviewed following labs and imaging studies  CBC: Recent Labs  Lab 10/04/22 0242 10/04/22 0929 10/04/22 1603 10/04/22 2245 10/05/22 0430  WBC 7.4  --   --   --  7.8  HGB 8.8* 8.9* 8.3* 7.8* 7.5*  HCT 27.2* 27.8* 24.1* 23.3* 23.0*  MCV 76.2*  --   --   --  78.0*  PLT 119*  --   --   --  106*   Basic Metabolic Panel: Recent Labs  Lab 10/04/22 0242 10/05/22 0430  NA 136 136  K 3.5 3.3*  CL 99 103  CO2 25 24  GLUCOSE 246* 124*  BUN 26* 28*  CREATININE 0.98 0.82  CALCIUM 9.1 8.5*    GFR: Estimated Creatinine Clearance: 53.1 mL/min (by C-G formula based on SCr of 0.82 mg/dL). Liver Function Tests: Recent Labs  Lab 10/04/22 0242  AST 23  ALT 13  ALKPHOS 50  BILITOT 0.4  PROT 6.2*  ALBUMIN 3.4*   Coagulation Profile: Recent Labs  Lab 10/04/22 0246 10/04/22 0929 10/05/22 0430  INR 1.6* 1.2 1.2   CBG: Recent Labs  Lab 10/04/22 1146 10/04/22 1623 10/04/22 2025  GLUCAP 167* 133* 168*    No results found for this or any previous visit (from the past 240 hour(s)).   Radiology Studies: CT ANGIO GI BLEED  Result Date: 10/04/2022 CLINICAL DATA:  Mesenteric ischemia, acute. Rectal bleeding. History of breast cancer. EXAM: CTA ABDOMEN AND PELVIS WITHOUT AND WITH CONTRAST TECHNIQUE: Multidetector CT imaging of the abdomen and pelvis was performed using the standard protocol during bolus administration of intravenous contrast. Multiplanar reconstructed images and MIPs were obtained and reviewed to evaluate the vascular anatomy. RADIATION DOSE REDUCTION: This exam was performed according to the departmental dose-optimization program which includes automated exposure control, adjustment of the mA and/or kV according to patient size and/or use of iterative reconstruction technique. CONTRAST:  OMNIPAQUE IOHEXOL 350 MG/ML SOLN COMPARISON:  09/02/2022. FINDINGS: VASCULAR Aorta: Normal caliber aorta without aneurysm, dissection, vasculitis or significant stenosis. Aortic atherosclerosis. 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 No dissection, stenosis, or vasculitis. SMA: Patent without evidence of aneurysm, dissection, vasculitis or significant stenosis. Renals: Both renal arteries are patent without evidence of aneurysm, dissection, vasculitis, fibromuscular dysplasia or significant stenosis. IMA: Patent without evidence of aneurysm, dissection, vasculitis or significant stenosis. Inflow: Patent without evidence  of aneurysm, dissection, vasculitis or significant stenosis. Proximal Outflow: Bilateral common femoral and visualized portions of the superficial and profunda femoral arteries are patent without evidence of aneurysm, dissection, vasculitis or significant stenosis. Veins: Within normal limits. Review of the MIP images confirms the above findings. NON-VASCULAR Lower chest: The heart is enlarged and there is a trace pericardial effusion. Coronary artery calcifications are noted. There is calcification of the mitral valve annulus. Mild subpleural fibrosis or scarring. Hepatobiliary: No focal liver abnormality is seen. Stones are present within the gallbladder. No biliary ductal dilatation. Pancreas: Unremarkable. No pancreatic ductal dilatation or surrounding inflammatory changes. Spleen: Normal in size without focal abnormality. Adrenals/Urinary Tract: The adrenal glands are stable. The kidneys enhance symmetrically. The kidneys enhance symmetrically. Subcentimeter hypodensities are present in the kidneys bilaterally which are too small to further characterize. No renal calculus or hydronephrosis. The bladder is unremarkable. Stomach/Bowel: There is a small hiatal hernia. Stomach is within normal limits. Appendix is not seen. No bowel obstruction, free air or pneumatosis. Scattered diverticula are present along the colon without evidence of diverticulitis. No acute hemorrhage is seen on noncontrast images. Pooling of of the contrast is noted in the mid descending colon which increases on delayed images in the territory of the IMA, best seen on series 8, image 37. Lymphatic: No abdominal  or pelvic lymphadenopathy. Reproductive: Status post hysterectomy. No adnexal masses. Other: There is laxity of the anterior abdominal wall. No abdominopelvic ascites. Musculoskeletal: Degenerative changes are present in the thoracolumbar spine. No acute or suspicious osseous abnormality. Total hip arthroplasty changes are present on  the right. IMPRESSION: VASCULAR 1. Pooling of contrast in the mid descending colon on arterial and delayed images suggesting active hemorrhage, likely branch of the IMA. 2. Aortic atherosclerosis without evidence of aneurysm or dissection. NON-VASCULAR 1. Pooling of contrast in the mid descending colon on arterial and delayed images suggesting active hemorrhage. 2. Diverticulosis without diverticulitis. 3. Cholelithiasis. 4. Small hiatal hernia. 5. Aortic atherosclerosis and coronary artery calcifications. Electronically Signed   By: Thornell Sartorius M.D.   On: 10/04/2022 04:26    Scheduled Meds:  sodium chloride   Intravenous Once   Chlorhexidine Gluconate Cloth  6 each Topical Daily   pantoprazole (PROTONIX) IV  40 mg Intravenous Q12H   Continuous Infusions:  lactated ringers 75 mL/hr at 10/04/22 2251     LOS: 0 days   Time spent:  Azucena Fallen, DO [facility]  If 7PM-7AM, please contact night-coverage [email or web address]  10/05/2022, 6:56 AM

## 2022-10-05 NOTE — Progress Notes (Signed)
Patient's hemoglobin is less than MD on call notified.

## 2022-10-06 DIAGNOSIS — I10 Essential (primary) hypertension: Secondary | ICD-10-CM | POA: Diagnosis present

## 2022-10-06 DIAGNOSIS — Z7984 Long term (current) use of oral hypoglycemic drugs: Secondary | ICD-10-CM | POA: Diagnosis not present

## 2022-10-06 DIAGNOSIS — Z7901 Long term (current) use of anticoagulants: Secondary | ICD-10-CM | POA: Diagnosis not present

## 2022-10-06 DIAGNOSIS — D696 Thrombocytopenia, unspecified: Secondary | ICD-10-CM | POA: Diagnosis present

## 2022-10-06 DIAGNOSIS — Z923 Personal history of irradiation: Secondary | ICD-10-CM | POA: Diagnosis not present

## 2022-10-06 DIAGNOSIS — Z86711 Personal history of pulmonary embolism: Secondary | ICD-10-CM | POA: Diagnosis not present

## 2022-10-06 DIAGNOSIS — C50919 Malignant neoplasm of unspecified site of unspecified female breast: Secondary | ICD-10-CM | POA: Diagnosis present

## 2022-10-06 DIAGNOSIS — K449 Diaphragmatic hernia without obstruction or gangrene: Secondary | ICD-10-CM | POA: Diagnosis present

## 2022-10-06 DIAGNOSIS — Z7960 Long term (current) use of unspecified immunomodulators and immunosuppressants: Secondary | ICD-10-CM | POA: Diagnosis not present

## 2022-10-06 DIAGNOSIS — K222 Esophageal obstruction: Secondary | ICD-10-CM | POA: Diagnosis present

## 2022-10-06 DIAGNOSIS — Z853 Personal history of malignant neoplasm of breast: Secondary | ICD-10-CM | POA: Diagnosis not present

## 2022-10-06 DIAGNOSIS — K2210 Ulcer of esophagus without bleeding: Secondary | ICD-10-CM | POA: Diagnosis present

## 2022-10-06 DIAGNOSIS — Z96641 Presence of right artificial hip joint: Secondary | ICD-10-CM | POA: Diagnosis present

## 2022-10-06 DIAGNOSIS — I959 Hypotension, unspecified: Secondary | ICD-10-CM | POA: Diagnosis not present

## 2022-10-06 DIAGNOSIS — Z79899 Other long term (current) drug therapy: Secondary | ICD-10-CM | POA: Diagnosis not present

## 2022-10-06 DIAGNOSIS — D62 Acute posthemorrhagic anemia: Secondary | ICD-10-CM | POA: Diagnosis present

## 2022-10-06 DIAGNOSIS — E1165 Type 2 diabetes mellitus with hyperglycemia: Secondary | ICD-10-CM | POA: Diagnosis present

## 2022-10-06 DIAGNOSIS — D6832 Hemorrhagic disorder due to extrinsic circulating anticoagulants: Secondary | ICD-10-CM | POA: Diagnosis present

## 2022-10-06 DIAGNOSIS — T45515A Adverse effect of anticoagulants, initial encounter: Secondary | ICD-10-CM | POA: Diagnosis present

## 2022-10-06 DIAGNOSIS — K5731 Diverticulosis of large intestine without perforation or abscess with bleeding: Secondary | ICD-10-CM | POA: Diagnosis present

## 2022-10-06 DIAGNOSIS — Z9013 Acquired absence of bilateral breasts and nipples: Secondary | ICD-10-CM | POA: Diagnosis not present

## 2022-10-06 DIAGNOSIS — K922 Gastrointestinal hemorrhage, unspecified: Secondary | ICD-10-CM | POA: Diagnosis not present

## 2022-10-06 DIAGNOSIS — Z87891 Personal history of nicotine dependence: Secondary | ICD-10-CM | POA: Diagnosis not present

## 2022-10-06 DIAGNOSIS — Z881 Allergy status to other antibiotic agents status: Secondary | ICD-10-CM | POA: Diagnosis not present

## 2022-10-06 DIAGNOSIS — E7800 Pure hypercholesterolemia, unspecified: Secondary | ICD-10-CM | POA: Diagnosis present

## 2022-10-06 LAB — GLUCOSE, CAPILLARY
Glucose-Capillary: 101 mg/dL — ABNORMAL HIGH (ref 70–99)
Glucose-Capillary: 103 mg/dL — ABNORMAL HIGH (ref 70–99)
Glucose-Capillary: 138 mg/dL — ABNORMAL HIGH (ref 70–99)
Glucose-Capillary: 107 mg/dL — ABNORMAL HIGH (ref 70–99)
Glucose-Capillary: 121 mg/dL — ABNORMAL HIGH (ref 70–99)

## 2022-10-06 LAB — CBC
HCT: 21.6 % — ABNORMAL LOW (ref 36.0–46.0)
Hemoglobin: 7 g/dL — ABNORMAL LOW (ref 12.0–15.0)
MCH: 26.6 pg (ref 26.0–34.0)
MCHC: 32.4 g/dL (ref 30.0–36.0)
MCV: 82.1 fL (ref 80.0–100.0)
Platelets: 101 10*3/uL — ABNORMAL LOW (ref 150–400)
RBC: 2.63 MIL/uL — ABNORMAL LOW (ref 3.87–5.11)
RDW: 18.1 % — ABNORMAL HIGH (ref 11.5–15.5)
WBC: 7.7 10*3/uL (ref 4.0–10.5)
nRBC: 0 % (ref 0.0–0.2)

## 2022-10-06 LAB — HEMOGLOBIN AND HEMATOCRIT, BLOOD
HCT: 26.6 % — ABNORMAL LOW (ref 36.0–46.0)
Hemoglobin: 8.4 g/dL — ABNORMAL LOW (ref 12.0–15.0)

## 2022-10-06 LAB — BPAM RBC: ISSUE DATE / TIME: 202406240849

## 2022-10-06 LAB — TYPE AND SCREEN

## 2022-10-06 LAB — PREPARE RBC (CROSSMATCH)

## 2022-10-06 MED ORDER — ENSURE ENLIVE PO LIQD
237.0000 mL | Freq: Two times a day (BID) | ORAL | Status: DC
  Administered 2022-10-07: 237 mL via ORAL

## 2022-10-06 MED ORDER — SODIUM CHLORIDE 0.9% IV SOLUTION: Freq: Once | INTRAVENOUS | Status: AC

## 2022-10-06 NOTE — Progress Notes (Signed)
Pt has hgb of 7.0, MD ordered 1 unit of blood. Blood was given, no reaction was observed. Repeat H&H was order, current Hgb is 8.4. No active bleeding was observed this shift.

## 2022-10-06 NOTE — Progress Notes (Signed)
PROGRESS NOTE    Tami Schmidt  XBM:[id number] DOB: 1932-10-25 DOA: 10/04/2022 PCP: Patient, No Pcp Per   Brief Narrative:  Tami Schmidt is a 89 y.o. female with medical history significant of breast cancer status post radiation and currently undergoing chemotherapy, history of pulmonary embolism (Jan 2023) on Xarelto, previous GI bleed, type 2 diabetes mellitus, and hypertension. She presented to [facility] ED early this morning after having multiple episodes of hematochezia with associated light-headedness vs dizziness. Hospitalist called for admission, IR and GI consulted.  Assessment & Plan:   Principal Problem:   Acute GI bleeding Active Problems:   Essential hypertension   Diabetes mellitus type II, controlled, with no complications (HCC)  Acute symptomatic anemia secondary to acute recurrent gastrointestinal bleeding, presumed diverticular in origin, POA Complicated by Anticoagulation use -Status post 1 unit PRBC, vitamin K, Kcentra administered at admission -Repeat 1 unit PRBC transfusion today given ongoing symptomatic anemia downtrending hemoglobin -Continue to hold anticoagulation(Eliquis) -CT confirms what appears to be descending colon bleed given contrast extravasation -[facility] currently not recommending endoscopy.   -IR on hold unless bleeding becomes brisk/continues after anticoagulation held.   Acute on chronic thrombocytopenia -Platelets continue to downtrend minimally, likely hemodilutional in setting of blood and IV fluids   Essential Hypertension -Currently somewhat hypotensive, continue to hold home blood pressure medications   Type II Diabetes Mellitus poorly controlled with hyperglycemia, without Complication -A1c 6.6 earlier this year -Continue sliding scale insulin, hypoglycemic protocol AC/HS CBG  DVT prophylaxis: SCDs Start: 10/04/22 0834 Code Status:   Code Status: Full Code Family Communication: None present  Status is: Inpatient  Dispo:  The patient is from: Home              Anticipated d/c is to: Home              Anticipated d/c date is: 24 to 48 hours pending clinical course and repeat labs  Consultants:  GI, IR  Procedures:  None planned  Antimicrobials:  None indicated  Subjective: No acute issues or events overnight denies nausea vomiting diarrhea constipation headache fevers chills or chest pain.  Objective: Vitals:   10/05/22 1651 10/05/22 2030 10/06/22 0000 10/06/22 0400  BP: 139/60 (!) 138/51 119/64 (!) 103/45  Pulse: 77     Resp: 20 (!) 22 (!) 22 18  Temp: 98.2 F (36.8 C) 98 F (36.7 C) 98.2 F (36.8 C) 98 F (36.7 C)  TempSrc: Oral Oral Oral Oral  SpO2: 98% 100% 97% 99%  Weight:      Height:       No intake or output data in the 24 hours ending 10/06/22 0717  Filed Weights   10/04/22 0635  Weight: 81.4 kg    Examination:  General:  Pleasantly resting in bed, No acute distress. HEENT:  Normocephalic atraumatic.  Sclerae nonicteric, noninjected.  Extraocular movements intact bilaterally. Neck:  Without mass or deformity.  Trachea is midline. Lungs:  Clear to auscultate bilaterally without rhonchi, wheeze, or rales. Heart:  Regular rate and rhythm.  Without murmurs, rubs, or gallops. Abdomen:  Soft, nontender, nondistended.  Without guarding or rebound. Extremities: Without cyanosis, clubbing, edema, or obvious deformity. Skin:  Warm and dry, no erythema.   Data Reviewed: I have personally reviewed following labs and imaging studies  CBC: Recent Labs  Lab 10/04/22 0242 10/04/22 0929 10/04/22 1603 10/04/22 2245 10/05/22 0430 10/06/22 0114  WBC 7.4  --   --   --  7.8 7.7  HGB 8.8*  8.9* 8.3* 7.8* 7.5* 7.0*  HCT 27.2* 27.8* 24.1* 23.3* 23.0* 21.6*  MCV 76.2*  --   --   --  78.0* 82.1  PLT 119*  --   --   --  106* 101*    Basic Metabolic Panel: Recent Labs  Lab 10/04/22 0242 10/05/22 0430  NA 136 136  K 3.5 3.3*  CL 99 103  CO2 25 24  GLUCOSE 246* 124*  BUN 26* 28*   CREATININE 0.98 0.82  CALCIUM 9.1 8.5*    GFR: Estimated Creatinine Clearance: 53.1 mL/min (by C-G formula based on SCr of 0.82 mg/dL). Liver Function Tests: Recent Labs  Lab 10/04/22 0242  AST 23  ALT 13  ALKPHOS 50  BILITOT 0.4  PROT 6.2*  ALBUMIN 3.4*    Coagulation Profile: Recent Labs  Lab 10/04/22 0246 10/04/22 0929 10/05/22 0430  INR 1.6* 1.2 1.2    CBG: Recent Labs  Lab 10/05/22 0800 10/05/22 1213 10/05/22 1647 10/05/22 2254 10/06/22 0600  GLUCAP 115* 135* 127* 99 101*     No results found for this or any previous visit (from the past 240 hour(s)).   Radiology Studies: No results found.  Scheduled Meds:  sodium chloride   Intravenous Once   Chlorhexidine Gluconate Cloth  6 each Topical Daily   pantoprazole (PROTONIX) IV  40 mg Intravenous Q12H   Continuous Infusions:  lactated ringers 75 mL/hr at 10/06/22 0526     LOS: 0 days   Time spent:  Azucena Fallen, DO [facility]  If 7PM-7AM, please contact night-coverage [email or web address]  10/06/2022, 7:17 AM

## 2022-10-06 NOTE — Progress Notes (Addendum)
Initial Nutrition Assessment  DOCUMENTATION CODES:   Not applicable  INTERVENTION:  Encouraged pt to continue choosing adequate source of protein at meals.  Provide Ensure Enlive po BID, each supplement provides 350 kcal and 20 grams of protein. Pt prefers strawberry flavor.  NUTRITION DIAGNOSIS:   Inadequate oral intake related to decreased appetite as evidenced by per patient/family report.  GOAL:   Patient will meet greater than or equal to 90% of their needs  MONITOR:   PO intake, Supplement acceptance, Labs, Weight trends, I & O's  REASON FOR ASSESSMENT:   Malnutrition Screening Tool    ASSESSMENT:   89 year old female with PMHx of breast cancer s/p XRT on chemotherapy, hx of pulmonary embolism on Xarelto, previous GI bleed, type 2 DM, HTN admitted with acute symptomatic anemia secondary to recurrent GI bleeding, acute on chronic thrombocytopenia.  -6/22: diet advanced to clear liquids -6/23: diet advanced to soft -6/24: diet advanced to regular  Met with pt at bedside. She reports her appetite has been decreased for approximately 1 year now while she has been receiving chemotherapy. 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 She reports she has still been eating 3 meals per day but has to force herself to eat. For breakfast she has bagel with cream cheese and sausage patty and fruit cup or toast with boiled egg and fruit cup. For lunch she has half of a sandwich. For dinner she has rice or potatoes with vegetables and meat. Denies food allergies or intolerances. Denies nausea, emesis, abdominal pain. Also denies difficulty with chewing/swallowing. Discussed importance of adequate intake of calories and protein at meals. In setting of reported weight loss, discussed benefit of oral nutrition supplement to meet calorie and protein needs and help prevent further weight loss. Pt amenable to trying Ensure while admitted. She reports she may  also make homemade smoothie at home with protein powder after discharge.   Pt reports her UBW was 220 lbs (100 kg) and that she has lost approximately 30 lbs over the past year. Per review of chart pt was 200.9 lbs (91.3 kg) on 09/19/21. Pt is currently 87.4 kg. She has lost 9.9 kg or 10.8% weight over the past year, which is not significant for time frame.  Medications reviewed and include: pantoprazole, LR at 75 mL/hour  Labs reviewed: CBG 101-138. On 6/23 Potassium 3.3, BUN 28 HgbA1c 6.6 04/21/22  UOP: documented as 743 occurrences unmeasured UOP yesterday (unsure of accuracy)  I/O: +1438.4 mL since admission  Pt is at risk for development of malnutrition.  NUTRITION - FOCUSED PHYSICAL EXAM:  Flowsheet Row Most Recent Value  Orbital Region No depletion  Upper Arm Region No depletion  Thoracic and Lumbar Region No depletion  Buccal Region No depletion  Temple Region No depletion  Clavicle Bone Region Mild depletion  Clavicle and Acromion Bone Region Mild depletion  Scapular Bone Region No depletion  Dorsal Hand No depletion  Patellar Region Mild depletion  Anterior Thigh Region No depletion  Posterior Calf Region Mild depletion  Edema (RD Assessment) None  Hair Reviewed  Eyes Reviewed  Mouth Reviewed  Skin Reviewed  Nails Reviewed       Diet Order:   Diet Order             Diet regular Room service appropriate? Yes; Fluid consistency: Thin  Diet effective now                  EDUCATION  NEEDS:   No education needs have been identified at this time  Skin:  Skin Assessment: Reviewed RN Assessment  Last BM:  10/04/22 - small type 6  Height:   Ht Readings from Last 1 Encounters:  10/04/22 5\' 7"  (1.702 m)   Weight:   Wt Readings from Last 1 Encounters:  10/04/22 81.4 kg   Ideal Body Weight:  61.4 kg  BMI:  Body mass index is 28.12 kg/m.  Estimated Nutritional Needs:   Kcal:  2000-2200  Protein:  100-110 grams  Fluid:  2-2.2 L/day  Letta Median, MS, RD, LDN, CNSC Pager number available on Amion

## 2022-10-07 DIAGNOSIS — K922 Gastrointestinal hemorrhage, unspecified: Secondary | ICD-10-CM | POA: Diagnosis not present

## 2022-10-07 LAB — TYPE AND SCREEN
ABO/RH(D): B POS
Antibody Screen: NEGATIVE
Unit division: 0

## 2022-10-07 LAB — CBC
HCT: 24.8 % — ABNORMAL LOW (ref 36.0–46.0)
Hemoglobin: 7.9 g/dL — ABNORMAL LOW (ref 12.0–15.0)
MCH: 25.3 pg — ABNORMAL LOW (ref 26.0–34.0)
MCHC: 31.9 g/dL (ref 30.0–36.0)
MCV: 79.5 fL — ABNORMAL LOW (ref 80.0–100.0)
Platelets: 113 10*3/uL — ABNORMAL LOW (ref 150–400)
RBC: 3.12 MIL/uL — ABNORMAL LOW (ref 3.87–5.11)
RDW: 18.6 % — ABNORMAL HIGH (ref 11.5–15.5)
WBC: 7.8 10*3/uL (ref 4.0–10.5)
nRBC: 0 % (ref 0.0–0.2)

## 2022-10-07 LAB — GLUCOSE, CAPILLARY: Glucose-Capillary: 93 mg/dL (ref 70–99)

## 2022-10-07 LAB — BPAM RBC
Blood Product Expiration Date: 202407102359
Unit Type and Rh: 7300

## 2022-10-07 MED ORDER — APIXABAN 5 MG PO TABS: 5.0000 mg | ORAL_TABLET | Freq: Two times a day (BID) | ORAL | 0 refills | Status: DC

## 2022-10-07 NOTE — Plan of Care (Signed)
Discharge home with husband. She said she will have transportation this afternoon.

## 2022-10-07 NOTE — Progress Notes (Signed)
Discharge instructions given. Patient verbalized understanding and all questions were answered.  ?

## 2022-10-07 NOTE — Discharge Summary (Signed)
Physician Discharge Summary  Tami Schmidt JYN:[id number] DOB: 06-Dec-1932 DOA: 10/04/2022  PCP: Patient, No Pcp Per  Admit date: 10/04/2022 Discharge date: 10/07/2022  Admitted From: Home Disposition:  Home  Recommendations for Outpatient Follow-up:  Follow up with PCP in 1-2 weeks CBC in 1 week  Home Health:None  Equipment/Devices:None  Discharge Condition:Stable  CODE STATUS:Full  Diet recommendation:  As tolerated  Brief/Interim Summary: Tami Schmidt is a 89 y.o. female with medical history significant of breast cancer status post radiation and currently undergoing chemotherapy, history of pulmonary embolism (Jan 2023) on Xarelto, previous GI bleed, type 2 diabetes mellitus, and hypertension. She presented to [facility] ED early this morning after having multiple episodes of hematochezia with associated light-headedness vs dizziness. Hospitalist called for admission, IR and GI consulted.   Patient admitted with likely diverticular bleed - resolved - stable for discharge. Hold Xarelto as discussed through Wed - call oncology to confirm restart date. Patient otherwise stable and agreeable for discharge.  Discharge Diagnoses:  Principal Problem:   Acute GI bleeding Active Problems:   Essential hypertension   Diabetes mellitus type II, controlled, with no complications (HCC)  Acute symptomatic anemia secondary to acute recurrent gastrointestinal bleeding, presumed diverticular in origin, POA Complicated by Anticoagulation use -Status post 2 unit PRBC, vitamin K, Kcentra -CT confirms what appears to be descending colon bleed given contrast extravasation -[facility] currently not recommending endoscopy - follow outpatient   Acute on chronic thrombocytopenia -Platelets stable   Essential Hypertension -Resume home meds   Type II Diabetes Mellitus poorly controlled with hyperglycemia, without Complication -A1c 6.6 earlier this year -Resume home meds  Discharge  Instructions   Allergies as of 10/07/2022       Reactions   Bactrim Swelling   SWELLING OF MOUTH/FACE.   Lisinopril Swelling   SWELLING OF MOUTH/FACE.   Vasotec Swelling   SWELLING OF MOUTH/FACE.   Sulfamethoxazole-trimethoprim    Other reaction(s): Unknown        Medication List     STOP taking these medications    lidocaine-prilocaine cream Commonly known as: EMLA   ondansetron 8 MG tablet Commonly known as: Zofran   prochlorperazine 10 MG tablet Commonly known as: COMPAZINE       TAKE these medications    amLODipine 10 MG tablet Commonly known as: NORVASC Take 10 mg by mouth daily.   apixaban 5 MG Tabs tablet Commonly known as: ELIQUIS Take 1 tablet (5 mg total) by mouth 2 (two) times daily.   metFORMIN 500 MG tablet Commonly known as: GLUCOPHAGE Take 500 mg by mouth daily with breakfast.   olmesartan-hydrochlorothiazide 40-12.5 MG tablet Commonly known as: BENICAR HCT Take 1 tablet by mouth daily.   QC TUMERIC COMPLEX PO Take 1 tablet by mouth daily.   SM Iron 325 (65 FE) MG tablet Generic drug: ferrous sulfate Take 325 mg by mouth daily.   Tylenol 325 MG tablet Generic drug: acetaminophen Take 325 mg by mouth daily.   VITAMIN D PO Take 1 tablet by mouth daily.        Allergies  Allergen Reactions   Bactrim Swelling    SWELLING OF MOUTH/FACE.   Lisinopril Swelling    SWELLING OF MOUTH/FACE.   Vasotec Swelling    SWELLING OF MOUTH/FACE.   Sulfamethoxazole-Trimethoprim     Other reaction(s): Unknown    Consultations: GI, IR  Procedures/Studies: CT ANGIO GI BLEED  Result Date: 10/04/2022 CLINICAL DATA:  Mesenteric ischemia, acute. Rectal bleeding. History of breast cancer. EXAM:  CTA ABDOMEN AND PELVIS WITHOUT AND WITH CONTRAST TECHNIQUE: Multidetector CT imaging of the abdomen and pelvis was performed using the standard protocol during bolus administration of intravenous contrast. Multiplanar reconstructed images and MIPs were  obtained and reviewed to evaluate the vascular anatomy. RADIATION DOSE REDUCTION: This exam was performed according to the departmental dose-optimization program which includes automated exposure control, adjustment of the mA and/or kV according to patient size and/or use of iterative reconstruction technique. CONTRAST:  OMNIPAQUE IOHEXOL 350 MG/ML SOLN COMPARISON:  09/02/2022. FINDINGS: VASCULAR Aorta: Normal caliber aorta without aneurysm, dissection, vasculitis or significant stenosis. Aortic atherosclerosis. 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 No dissection, stenosis, or vasculitis. SMA: Patent without evidence of aneurysm, dissection, vasculitis or significant stenosis. Renals: Both renal arteries are patent without evidence of aneurysm, dissection, vasculitis, fibromuscular dysplasia or significant stenosis. IMA: Patent without evidence of aneurysm, dissection, vasculitis or significant stenosis. Inflow: Patent without evidence of aneurysm, dissection, vasculitis or significant stenosis. Proximal Outflow: Bilateral common femoral and visualized portions of the superficial and profunda femoral arteries are patent without evidence of aneurysm, dissection, vasculitis or significant stenosis. Veins: Within normal limits. Review of the MIP images confirms the above findings. NON-VASCULAR Lower chest: The heart is enlarged and there is a trace pericardial effusion. Coronary artery calcifications are noted. There is calcification of the mitral valve annulus. Mild subpleural fibrosis or scarring. Hepatobiliary: No focal liver abnormality is seen. Stones are present within the gallbladder. No biliary ductal dilatation. Pancreas: Unremarkable. No pancreatic ductal dilatation or surrounding inflammatory changes. Spleen: Normal in size without focal abnormality. Adrenals/Urinary Tract: The adrenal glands are stable. The kidneys enhance symmetrically. The  kidneys enhance symmetrically. Subcentimeter hypodensities are present in the kidneys bilaterally which are too small to further characterize. No renal calculus or hydronephrosis. The bladder is unremarkable. Stomach/Bowel: There is a small hiatal hernia. Stomach is within normal limits. Appendix is not seen. No bowel obstruction, free air or pneumatosis. Scattered diverticula are present along the colon without evidence of diverticulitis. No acute hemorrhage is seen on noncontrast images. Pooling of of the contrast is noted in the mid descending colon which increases on delayed images in the territory of the IMA, best seen on series 8, image 37. Lymphatic: No abdominal or pelvic lymphadenopathy. Reproductive: Status post hysterectomy. No adnexal masses. Other: There is laxity of the anterior abdominal wall. No abdominopelvic ascites. Musculoskeletal: Degenerative changes are present in the thoracolumbar spine. No acute or suspicious osseous abnormality. Total hip arthroplasty changes are present on the right. IMPRESSION: VASCULAR 1. Pooling of contrast in the mid descending colon on arterial and delayed images suggesting active hemorrhage, likely branch of the IMA. 2. Aortic atherosclerosis without evidence of aneurysm or dissection. NON-VASCULAR 1. Pooling of contrast in the mid descending colon on arterial and delayed images suggesting active hemorrhage. 2. Diverticulosis without diverticulitis. 3. Cholelithiasis. 4. Small hiatal hernia. 5. Aortic atherosclerosis and coronary artery calcifications. Electronically Signed   By: Thornell Sartorius M.D.   On: 10/04/2022 04:26   US THYROID  Result Date: 09/15/2022 CLINICAL DATA:  89 year old female with a history of thyroid follow-up EXAM: THYROID ULTRASOUND TECHNIQUE: Ultrasound examination of the thyroid gland and adjacent soft tissues was performed. COMPARISON:  09/24/2021 FINDINGS: Parenchymal Echotexture: Moderately heterogenous Isthmus: 0.7 cm Right lobe: 4.6 cm x  2.2 cm x 2.1 cm Left lobe: 5.7 cm x 2.7 cm x 1.9 cm _________________________________________________________ Estimated total number of nodules >/= 1 cm: 5 Number of  spongiform nodules >/=  2 cm not described below (TR1): 0 Number of mixed cystic and solid nodules >/= 1.5 cm not described below (TR2): 0 _________________________________________________________ Nodule labeled 1 inferior isthmus has grown in the interval, now 2.5 cm and meets the threshold for biopsy. Nodule labeled 2, right thyroid, 9 mm. Spongiform and does not meet criteria for surveillance. Nodule labeled 3, right superior thyroid, 1.3 cm with TR 2/cystic characteristics and does not meet criteria for surveillance. Nodule labeled 4, mid right thyroid, 9 mm with TR 2/cystic characteristics and does not meet criteria for surveillance. Nodule labeled 5, superior left thyroid, 1.5 cm with cystic/TR 0 characteristic and does not meet criteria for surveillance. Nodule labeled 6, 1.4 cm, TR 0/cystic and does not meet criteria for surveillance. Nodule labeled 7, inferior left thyroid, unchanged and does not meet criteria for surveillance. No adenopathy Recommendations follow those established by the new ACR TI-RADS criteria (J Am Coll Radiol 2017;14:587-595). IMPRESSION: Similar appearance of multinodular thyroid as above. There has been growth of nodule 1, TR 3 nodule now 2.5 cm and meets the threshold for biopsy. Referral for biopsy is recommended. Recommendations follow those established by the new ACR TI-RADS criteria (J Am Coll Radiol 2017;14:587-595). Electronically Signed   By: Gilmer Mor D.O.   On: 09/15/2022 11:33     Subjective: No acute issues or events overnight   Discharge Exam: Vitals:   10/07/22 0607 10/07/22 0814  BP: 139/63 (!) 146/83  Pulse: 71   Resp: 20 20  Temp: 98.1 F (36.7 C) (!) 97.5 F (36.4 C)  SpO2: 98% 97%   Vitals:   10/06/22 1954 10/06/22 2326 10/07/22 0607 10/07/22 0814  BP: (!) 144/63 (!) 138/58  139/63 (!) 146/83  Pulse: 77  71   Resp: 20 20 20 20   Temp: (!) 97.5 F (36.4 C) 97.7 F (36.5 C) 98.1 F (36.7 C) (!) 97.5 F (36.4 C)  TempSrc: Oral Oral Oral Oral  SpO2: 99% 96% 98% 97%  Weight:      Height:        General: Pt is alert, awake, not in acute distress Cardiovascular: RRR, S1/S2 +, no rubs, no gallops Respiratory: CTA bilaterally, no wheezing, no rhonchi Abdominal: Soft, NT, ND, bowel sounds + Extremities: no edema, no cyanosis    The results of significant diagnostics from this hospitalization (including imaging, microbiology, ancillary and laboratory) are listed below for reference.     Microbiology: No results found for this or any previous visit (from the past 240 hour(s)).   Labs: BNP (last 3 results) No results for input(s): "BNP" in the last 8760 hours. Basic Metabolic Panel: Recent Labs  Lab 10/04/22 0242 10/05/22 0430  NA 136 136  K 3.5 3.3*  CL 99 103  CO2 25 24  GLUCOSE 246* 124*  BUN 26* 28*  CREATININE 0.98 0.82  CALCIUM 9.1 8.5*   Liver Function Tests: Recent Labs  Lab 10/04/22 0242  AST 23  ALT 13  ALKPHOS 50  BILITOT 0.4  PROT 6.2*  ALBUMIN 3.4*   No results for input(s): "LIPASE", "AMYLASE" in the last 168 hours. No results for input(s): "AMMONIA" in the last 168 hours. CBC: Recent Labs  Lab 10/04/22 0242 10/04/22 0929 10/04/22 2245 10/05/22 0430 10/06/22 0114 10/06/22 1345 10/07/22 0105  WBC 7.4  --   --  7.8 7.7  --  7.8  HGB 8.8*   < > 7.8* 7.5* 7.0* 8.4* 7.9*  HCT 27.2*   < > 23.3* 23.0*  21.6* 26.6* 24.8*  MCV 76.2*  --   --  78.0* 82.1  --  79.5*  PLT 119*  --   --  106* 101*  --  113*   < > = values in this interval not displayed.   CBG: Recent Labs  Lab 10/06/22 0835 10/06/22 1126 10/06/22 1623 10/06/22 2104 10/07/22 0610  GLUCAP 103* 138* 107* 121* 93   Urinalysis    Component Value Date/Time   COLORURINE YELLOW 04/27/2021 1336   APPEARANCEUR CLEAR 04/27/2021 1336   LABSPEC 1.013  04/27/2021 1336   PHURINE 7.0 04/27/2021 1336   GLUCOSEU >1,000 (A) 04/27/2021 1336   HGBUR SMALL (A) 04/27/2021 1336   BILIRUBINUR NEGATIVE 04/27/2021 1336   KETONESUR 40 (A) 04/27/2021 1336   PROTEINUR 100 (A) 04/27/2021 1336   UROBILINOGEN 0.2 04/26/2010 0840   NITRITE NEGATIVE 04/27/2021 1336   LEUKOCYTESUR NEGATIVE 04/27/2021 1336   Sepsis Labs Recent Labs  Lab 10/04/22 0242 10/05/22 0430 10/06/22 0114 10/07/22 0105  WBC 7.4 7.8 7.7 7.8   Microbiology No results found for this or any previous visit (from the past 240 hour(s)).   Time coordinating discharge: Over 30 minutes  SIGNED:   Azucena Fallen, DO [facility] 10/07/2022, 9:43 AM Pager   If 7PM-7AM, please contact night-coverage [email or web address]

## 2022-10-15 ENCOUNTER — Inpatient Hospital Stay: Payer: Medicare Other | Admitting: Hematology and Oncology

## 2022-10-15 ENCOUNTER — Other Ambulatory Visit: Payer: Self-pay

## 2022-10-15 ENCOUNTER — Inpatient Hospital Stay: Payer: Medicare Other | Attending: Adult Health

## 2022-10-15 ENCOUNTER — Inpatient Hospital Stay: Payer: Medicare Other

## 2022-10-15 VITALS — BP 129/54 | HR 64 | Temp 97.9°F | Resp 18

## 2022-10-15 DIAGNOSIS — Z79899 Other long term (current) drug therapy: Secondary | ICD-10-CM | POA: Insufficient documentation

## 2022-10-15 DIAGNOSIS — Z170 Estrogen receptor positive status [ER+]: Secondary | ICD-10-CM

## 2022-10-15 DIAGNOSIS — R432 Parageusia: Secondary | ICD-10-CM | POA: Diagnosis not present

## 2022-10-15 DIAGNOSIS — Z7984 Long term (current) use of oral hypoglycemic drugs: Secondary | ICD-10-CM | POA: Insufficient documentation

## 2022-10-15 DIAGNOSIS — G629 Polyneuropathy, unspecified: Secondary | ICD-10-CM | POA: Diagnosis not present

## 2022-10-15 DIAGNOSIS — C50312 Malignant neoplasm of lower-inner quadrant of left female breast: Secondary | ICD-10-CM | POA: Diagnosis present

## 2022-10-15 DIAGNOSIS — Z5112 Encounter for antineoplastic immunotherapy: Secondary | ICD-10-CM | POA: Insufficient documentation

## 2022-10-15 DIAGNOSIS — Z452 Encounter for adjustment and management of vascular access device: Secondary | ICD-10-CM | POA: Diagnosis not present

## 2022-10-15 DIAGNOSIS — Z95828 Presence of other vascular implants and grafts: Secondary | ICD-10-CM

## 2022-10-15 DIAGNOSIS — C50919 Malignant neoplasm of unspecified site of unspecified female breast: Secondary | ICD-10-CM

## 2022-10-15 LAB — CMP (CANCER CENTER ONLY)
ALT: 9 U/L (ref 0–44)
AST: 18 U/L (ref 15–41)
Albumin: 3.7 g/dL (ref 3.5–5.0)
Alkaline Phosphatase: 55 U/L (ref 38–126)
Anion gap: 10 (ref 5–15)
BUN: 12 mg/dL (ref 8–23)
CO2: 25 mmol/L (ref 22–32)
Calcium: 9.3 mg/dL (ref 8.9–10.3)
Chloride: 104 mmol/L (ref 98–111)
Creatinine: 0.68 mg/dL (ref 0.44–1.00)
GFR, Estimated: >60 mL/min (ref >60)
Glucose, Bld: 95 mg/dL (ref 70–99)
Potassium: 3.8 mmol/L (ref 3.5–5.1)
Sodium: 139 mmol/L (ref 135–145)
Total Bilirubin: 0.4 mg/dL (ref 0.3–1.2)
Total Protein: 6.6 g/dL (ref 6.5–8.1)

## 2022-10-15 LAB — CBC WITH DIFFERENTIAL (CANCER CENTER ONLY)
Abs Immature Granulocytes: 0.05 10*3/uL (ref 0.00–0.07)
Basophils Absolute: 0 10*3/uL (ref 0.0–0.1)
Basophils Relative: 0 %
Eosinophils Absolute: 0.1 10*3/uL (ref 0.0–0.5)
Eosinophils Relative: 2 %
HCT: 29.3 % — ABNORMAL LOW (ref 36.0–46.0)
Hemoglobin: 9.2 g/dL — ABNORMAL LOW (ref 12.0–15.0)
Immature Granulocytes: 1 %
Lymphocytes Relative: 19 %
Lymphs Abs: 1.6 10*3/uL (ref 0.7–4.0)
MCH: 25.7 pg — ABNORMAL LOW (ref 26.0–34.0)
MCHC: 31.4 g/dL (ref 30.0–36.0)
MCV: 81.8 fL (ref 80.0–100.0)
Monocytes Absolute: 0.5 10*3/uL (ref 0.1–1.0)
Monocytes Relative: 6 %
Neutro Abs: 6.2 10*3/uL (ref 1.7–7.7)
Neutrophils Relative %: 72 %
Platelet Count: 303 10*3/uL (ref 150–400)
RBC: 3.58 MIL/uL — ABNORMAL LOW (ref 3.87–5.11)
RDW: 18.8 % — ABNORMAL HIGH (ref 11.5–15.5)
WBC Count: 8.5 10*3/uL (ref 4.0–10.5)
nRBC: 0 % (ref 0.0–0.2)

## 2022-10-15 LAB — HEMOGLOBIN A1C
Hgb A1c MFr Bld: 5.5 % (ref 4.8–5.6)
Mean Plasma Glucose: 111.15 mg/dL

## 2022-10-15 MED ORDER — SODIUM CHLORIDE 0.9 % IV SOLN
3.6000 mg/kg | Freq: Once | INTRAVENOUS | Status: AC
  Administered 2022-10-15: 320 mg via INTRAVENOUS
  Filled 2022-10-15: qty 16

## 2022-10-15 MED ORDER — DIPHENHYDRAMINE HCL 25 MG PO CAPS
50.0000 mg | ORAL_CAPSULE | Freq: Once | ORAL | Status: AC
  Administered 2022-10-15: 50 mg via ORAL
  Filled 2022-10-15: qty 2

## 2022-10-15 MED ORDER — HEPARIN SOD (PORK) LOCK FLUSH 100 UNIT/ML IV SOLN
500.0000 [IU] | Freq: Once | INTRAVENOUS | Status: AC | PRN
  Administered 2022-10-15: 500 [IU]

## 2022-10-15 MED ORDER — SODIUM CHLORIDE 0.9% FLUSH
10.0000 mL | Freq: Once | INTRAVENOUS | Status: AC
  Administered 2022-10-15: 10 mL

## 2022-10-15 MED ORDER — PROCHLORPERAZINE MALEATE 10 MG PO TABS
10.0000 mg | ORAL_TABLET | Freq: Once | ORAL | Status: AC
  Administered 2022-10-15: 10 mg via ORAL
  Filled 2022-10-15: qty 1

## 2022-10-15 MED ORDER — ACETAMINOPHEN 325 MG PO TABS
650.0000 mg | ORAL_TABLET | Freq: Once | ORAL | Status: AC
  Administered 2022-10-15: 650 mg via ORAL
  Filled 2022-10-15: qty 2

## 2022-10-15 MED ORDER — SODIUM CHLORIDE 0.9 % IV SOLN: Freq: Once | INTRAVENOUS | Status: AC

## 2022-10-15 MED ORDER — SODIUM CHLORIDE 0.9% FLUSH
10.0000 mL | INTRAVENOUS | Status: DC | PRN
  Administered 2022-10-15: 10 mL

## 2022-10-15 NOTE — Progress Notes (Signed)
[facility]    Patient Care Team: Patient, No Pcp Per as PCP - General (General Practice) Otho Darner, MD as Consulting Physician (Family Medicine) Donnelly Angelica, RN as Oncology Nurse Navigator Pershing Proud, RN as Oncology Nurse Navigator Rachel Moulds, MD as Consulting Physician (Hematology and Oncology)   Name of the patient: Tami Schmidt  [id number]  07-Feb-1933   Date of visit: 10/15/2022   Chief Complaint/Reason for visit: toxicity check  Current Therapy: Kadcyla  ASSESSMENT & PLAN:   Patient is a 89 y.o. 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 Patient is currently on second line Kadcyla.  For first-line she received Herceptin and pertuzumab alone given her age and comorbidities.  She is doing well overall.  Since last visit, she was admitted for diverticular bleed, received some units of PRBC, anticoagulation was held and she is here for follow up. She didn't have any more episodes of this since the hospitalization.  Then with regards to the ultrasound thyroid we have discussed the findings and recommendation was to consider thyroid biopsy.  However given her advanced age, metastatic breast cancer, other medical comorbidities, poor understanding of her medical situation over all, we have discussed that the thyroid biopsy is not a high priority and we will continue to monitor it with ultrasound thyroid.  She is agreeable to this plan.  RTC in 3 weeks. Heme/Onc History: Oncology History  Malignant neoplasm of lower-inner quadrant of left breast in female, estrogen receptor positive (HCC)  06/16/2013 Initial Diagnosis   Malignant neoplasm of lower-inner quadrant of left breast in female, estrogen receptor positive (HCC)   05/30/2021 - 04/03/2022 Chemotherapy   Patient is on Treatment Plan : BREAST Trastuzumab q28d      Genetic Testing    [facility] CancerNext-Expanded Panel was Negative. 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 Report date is 07/22/2021.  The CancerNext-Expanded gene panel offered by [facility] and includes sequencing, rearrangement, and RNA analysis for the following 77 genes: AIP, ALK, APC, ATM, AXIN2, BAP1, BARD1, BLM, BMPR1A, BRCA1, BRCA2, BRIP1, CDC73, CDH1, CDK4, CDKN1B, CDKN2A, CHEK2, CTNNA1, DICER1, FANCC, FH, FLCN, GALNT12, KIF1B, LZTR1, MAX, MEN1, MET, MLH1, MSH2, MSH3, MSH6, MUTYH, NBN, NF1, NF2, NTHL1, PALB2, PHOX2B, PMS2, POT1, PRKAR1A, PTCH1, PTEN, RAD51C, RAD51D, RB1, RECQL, RET, SDHA, SDHAF2, SDHB, SDHC, SDHD, SMAD4, SMARCA4, SMARCB1, SMARCE1, STK11, SUFU, TMEM127, TP53, TSC1, TSC2, VHL and XRCC2 (sequencing and deletion/duplication); EGFR, EGLN1, HOXB13, KIT, MITF, PDGFRA, POLD1, and POLE (sequencing only); EPCAM and GREM1 (deletion/duplication only).    05/01/2022 -  Chemotherapy   Patient is on Treatment Plan : BREAST ADO-Trastuzumab Emtansine (Kadcyla) q21d      Interval history-: Tami Schmidt is a 89 y.o. female with oncologic history as above seen today for toxicity check prior to Kadcyla.    She wants to proceed with treatment today and hold and see if she can do radiation.  Allergies  Allergen Reactions   Bactrim Swelling    SWELLING OF MOUTH/FACE.   Lisinopril Swelling    SWELLING OF MOUTH/FACE.   Vasotec Swelling    SWELLING OF MOUTH/FACE.   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   Diabetes mellitus    fasting 90-100   HTN (hypertension) 03/25/2013   Hx of radiation therapy    breasts hx   Hypercholesterolemia    Hypertension    Lymphedema of arm    left arm   Pulmonary embolism (HCC)    Type II or unspecified type  diabetes mellitus without mention of complication, not stated as uncontrolled 03/25/2013     Past Surgical History:  Procedure Laterality Date   ABDOMINAL HYSTERECTOMY     ANKLE SURGERY Right 1995   APPENDECTOMY     BIOPSY  01/07/2018   Procedure: BIOPSY;  Surgeon: Kerin Salen, MD;  Location: [facility];  Service: Gastroenterology;;   BREAST SURGERY Bilateral    mastectomy   COLONOSCOPY WITH PROPOFOL N/A 01/07/2018   Procedure: COLONOSCOPY WITH PROPOFOL;  Surgeon: Kerin Salen, MD;  Location: [facility];  Service: Gastroenterology;  Laterality: N/A;   ESOPHAGOGASTRODUODENOSCOPY (EGD) WITH PROPOFOL N/A 01/06/2018   Procedure: ESOPHAGOGASTRODUODENOSCOPY (EGD) WITH PROPOFOL;  Surgeon: Kerin Salen, MD;  Location: [facility];  Service: Gastroenterology;  Laterality: N/A;   HERNIA REPAIR  04-26-2010   IR IMAGING GUIDED PORT INSERTION  10/29/2021   MASS EXCISION Left 01/26/2013   Procedure: EXCISION LEFT CHEST WALL MASS AND LEFT ABDOMNAL WALL MASS;  Surgeon: [name], MD;  Location: [facility];  Service: General;  Laterality: Left;   MASS EXCISION Left 09/20/2014   Procedure: EXCISION OF LEFT CHEST WALL MASS;  Surgeon: [name], MD;  Location: [facility];  Service: General;  Laterality: Left;   TEE WITHOUT CARDIOVERSION N/A 12/08/2017   Procedure: TRANSESOPHAGEAL ECHOCARDIOGRAM (TEE);  Surgeon: Lewayne Bunting, MD;  Location: [facility];  Service: Cardiovascular;  Laterality: N/A;   TOTAL HIP ARTHROPLASTY Right 01/05/2017   TOTAL HIP ARTHROPLASTY Right 01/05/2017   Procedure: TOTAL HIP ARTHROPLASTY ANTERIOR APPROACH;  Surgeon: Gean Birchwood, MD;  Location: [facility] OR;  Service: Orthopedics;  Laterality: Right;    Social History   Socioeconomic History   Marital status: Married    Spouse name: Not on file   Number of children: Not on file   Years of education: Not on file   Highest education level: Not on file  Occupational History   Occupation: retired Arts development officer  Tobacco Use   Smoking status: Former    Types: Cigarettes    Quit date: 04/14/1972    Years since quitting: 50.5   Smokeless tobacco: Never  Vaping Use   Vaping Use: Never used  Substance and Sexual Activity   Alcohol use: Yes    Comment: occasional   Drug use: Not Currently   Sexual activity: Yes    Birth control/protection: Surgical  Other Topics Concern   Not on file  Social History Narrative   Not on file   Social Determinants of Health   Financial Resource Strain: Not on file  Food Insecurity: No Food Insecurity (10/05/2022)   Hunger Vital Sign    Worried About Running Out of Food in the Last Year: Never true    Ran Out of Food in the Last Year: Never true  Transportation Needs: No Transportation Needs (10/05/2022)   PRAPARE - Administrator, Civil Service (Medical): No    Lack of Transportation (Non-Medical): No  Physical Activity: Not on file  Stress: Not on file  Social Connections: Not on file  Intimate Partner Violence: Not At Risk (10/05/2022)   Humiliation, Afraid, Rape, and Kick questionnaire  Fear of Current or Ex-Partner: No    Emotionally Abused: No    Physically Abused: No    Sexually Abused: No    Family History  Problem Relation Age of Onset   Breast cancer Cousin        maternal first cousin   Uterine cancer Cousin        maternal first cousin     Current Outpatient Medications:    amLODipine (NORVASC) 10 MG tablet, Take 10 mg by mouth daily., Disp: , Rfl:    apixaban (ELIQUIS) 5 MG TABS tablet, Take 1 tablet (5 mg total) by mouth 2 (two) times daily., Disp: 180 tablet, Rfl: 0   metFORMIN (GLUCOPHAGE) 500 MG tablet, Take 500 mg by mouth daily with breakfast., Disp: , Rfl:    olmesartan-hydrochlorothiazide (BENICAR HCT) 40-12.5 MG tablet, Take 1 tablet by mouth daily., Disp: , Rfl:    SM IRON 325 (65 Fe) MG tablet, Take 325 mg by mouth daily., Disp: , Rfl:    Turmeric (QC TUMERIC COMPLEX PO), Take 1 tablet by mouth daily.,  Disp: , Rfl:    TYLENOL 325 MG tablet, Take 325 mg by mouth daily., Disp: , Rfl:    VITAMIN D PO, Take 1 tablet by mouth daily., Disp: , Rfl:   PHYSICAL EXAM: ECOG FS:1 - Symptomatic but completely ambulatory    Vitals:   10/15/22 1244  BP: (!) 157/46  Pulse: 81  Resp: 18  Temp: 98 F (36.7 C)  TempSrc: Oral  SpO2: 100%  Weight: 179 lb 3.2 oz (81.3 kg)     Physical Exam Vitals and nursing note reviewed.  Constitutional:      Appearance: She is well-developed. She is not ill-appearing or toxic-appearing.  HENT:     Head: Normocephalic.     Nose: Nose normal.  Eyes:     Conjunctiva/sclera: Conjunctivae normal.  Neck:     Vascular: No JVD.  Cardiovascular:     Rate and Rhythm: Rhythm irregular.     Pulses:          Radial pulses are 2+ on the right side and 2+ on the left side.  Pulmonary:     Effort: Pulmonary effort is normal.     Breath sounds: Normal breath sounds.  Chest:     Comments: Axilla exam challenging, pt can't lift her arms  Abdominal:     General: There is no distension.  Musculoskeletal:     Cervical back: Normal range of motion.     Comments: LUE lymphedema  Skin:    General: Skin is warm and dry.     Capillary Refill: Capillary refill takes less than 2 seconds.  Neurological:     Mental Status: She is oriented to person, place, and time.        LABORATORY DATA: I have reviewed the data as listed    Latest Ref Rng & Units 10/07/2022    1:05 AM 10/06/2022    1:45 PM 10/06/2022    1:14 AM  CBC  WBC 4.0 - 10.5 K/uL 7.8   7.7   Hemoglobin 12.0 - 15.0 g/dL 7.9  8.4  7.0   Hematocrit 36.0 - 46.0 % 24.8  26.6  21.6   Platelets 150 - 400 K/uL 113   101         Latest Ref Rng & Units 10/05/2022    4:30 AM 10/04/2022    2:42 AM 09/25/2022   10:15 AM  CMP  Glucose 70 - 99 mg/dL  124  246  192   BUN 8 - 23 mg/dL 28  26  16    Creatinine 0.44 - 1.00 mg/dL 1.61  0.96  0.45   Sodium 135 - 145 mmol/L 136  136  138   Potassium 3.5 - 5.1 mmol/L 3.3   3.5  3.6   Chloride 98 - 111 mmol/L 103  99  102   CO2 22 - 32 mmol/L 24  25  26    Calcium 8.9 - 10.3 mg/dL 8.5  9.1  9.5   Total Protein 6.5 - 8.1 g/dL  6.2  7.5   Total Bilirubin 0.3 - 1.2 mg/dL  0.4  0.5   Alkaline Phos 38 - 126 U/L  50  49   AST 15 - 41 U/L  23  19   ALT 0 - 44 U/L  13  10      RADIOGRAPHIC STUDIES (from last 24 hours if applicable) I have personally reviewed the radiological images as listed and agreed with the findings in the report. No results found.    Visit Diagnosis: 1. Malignant neoplasm of lower-inner quadrant of left breast in female, estrogen receptor positive (HCC)      No orders of the defined types were placed in this encounter.   All questions were answered. The patient knows to call the clinic with any problems, questions or concerns. No barriers to learning was detected.  I have spent a total of 30 minutes minutes of face-to-face and non-face-to-face time, preparing to see the patient, obtaining and/or reviewing separately obtained history, performing a medically appropriate examination, counseling and educating the patient, ordering tests, documenting clinical information in the electronic health record, and care coordination (communications with other health care professionals or caregivers).   Thank you for allowing me to participate in the care of this patient.    Rachel Moulds, MD Department of Hematology/Oncology [facility] Phone: [phone]  [phone]

## 2022-10-15 NOTE — Patient Instructions (Signed)
[facility]  Discharge Instructions: Thank you for choosing [facility] to provide your oncology and hematology care.   If you have a lab appointment with the Cancer Center, please go directly to the Cancer Center and check in at the registration area.   Wear comfortable clothing and clothing appropriate for easy access to any Portacath or PICC line.   We strive to give you quality time with your provider. You may need to reschedule your appointment if you arrive late (15 or more minutes).  Arriving late affects you and other patients whose appointments are after yours.  Also, if you miss three or more appointments without notifying the office, you may be dismissed from the clinic at the provider's discretion.      For prescription refill requests, have your pharmacy contact our office and allow 72 hours for refills to be completed.    Today you received the following chemotherapy and/or immunotherapy agents: Kadcyla.       To help prevent nausea and vomiting after your treatment, we encourage you to take your nausea medication as directed.  BELOW ARE SYMPTOMS THAT SHOULD BE REPORTED IMMEDIATELY: *FEVER GREATER THAN 100.4 F (38 C) OR HIGHER *CHILLS OR SWEATING *NAUSEA AND VOMITING THAT IS NOT CONTROLLED WITH YOUR NAUSEA MEDICATION *UNUSUAL SHORTNESS OF BREATH *UNUSUAL BRUISING OR BLEEDING *URINARY PROBLEMS (pain or burning when urinating, or frequent urination) *BOWEL PROBLEMS (unusual diarrhea, constipation, pain near the anus) TENDERNESS IN MOUTH AND THROAT WITH OR WITHOUT PRESENCE OF ULCERS (sore throat, sores in mouth, or a toothache) UNUSUAL RASH, SWELLING OR PAIN  UNUSUAL VAGINAL DISCHARGE OR ITCHING   Items with * indicate a potential emergency and should be followed up as soon as possible or go to the Emergency Department if any problems should occur.  Please show the CHEMOTHERAPY ALERT CARD or IMMUNOTHERAPY ALERT CARD at  check-in to the Emergency Department and triage nurse.  Should you have questions after your visit or need to cancel or reschedule your appointment, please contact [facility]  Dept: [phone]  and follow the prompts.  Office hours are 8:00 a.m. to 4:30 p.m. Monday - Friday. Please note that voicemails left after 4:00 p.m. may not be returned until the following business day.  We are closed weekends and major holidays. You have access to a nurse at all times for urgent questions. Please call the main number to the clinic Dept: [phone] and follow the prompts.   For any non-urgent questions, you may also contact your provider using MyChart. We now offer e-Visits for anyone 18 and older to request care online for non-urgent symptoms. For details visit [email or web address].   Also download the MyChart app! Go to the app store, search "MyChart", open the app, select [facility], and log in with your MyChart username and password.   

## 2022-10-15 NOTE — Progress Notes (Signed)
[facility]    Patient Care Team: Patient, No Pcp Per as PCP - General (General Practice) Tami Darner, MD as Consulting Physician (Family Medicine) Tami Angelica, RN as Oncology Nurse Navigator Tami Proud, RN as Oncology Nurse Navigator Tami Moulds, MD as Consulting Physician (Hematology and Oncology)   Name of the patient: Tami Schmidt  [id number]  March 25, 1933   Date of visit: 10/15/2022   Chief Complaint/Reason for visit: toxicity check  Current Therapy: Kadcyla  ASSESSMENT & PLAN:   Patient is a 89 y.o. 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 Patient is currently on second line Kadcyla.  For first-line she received Herceptin and pertuzumab alone given her age and comorbidities.  She is doing well overall.  Since her last visit here, she had an admission for GI bleed thought to be secondary to diverticulosis, received some packed red blood cells and has been healing well.  Today she just complains of ongoing fatigue, otherwise no new symptoms. NO change in breathing, bowels are moving well. No change in urinary habits. Most recent exam with no new disease, stable over all. We will plan to proceed with Kadcyla today if all labs are within parameters.  In the past when we discussed about thyroid biopsy, she wanted to defer to a later date or just monitor.  I think this is acceptable given her advanced breast cancer which is likely the main factor limiting her life expectancy and it is unlikely that the thyroid abnormality findings are related to breast cancer.    RTC in 3 weeks. Heme/Onc History: Oncology History  Malignant neoplasm of lower-inner quadrant of left breast in female, estrogen receptor positive (HCC)  06/16/2013 Initial Diagnosis   Malignant neoplasm of lower-inner quadrant of left breast in female, estrogen receptor positive  (HCC)   05/30/2021 - 04/03/2022 Chemotherapy   Patient is on Treatment Plan : BREAST Trastuzumab q28d      Genetic Testing   [facility] CancerNext-Expanded Panel was Negative. 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 Report date is 07/22/2021.  The CancerNext-Expanded gene panel offered by [facility] and includes sequencing, rearrangement, and RNA analysis for the following 77 genes: AIP, ALK, APC, ATM, AXIN2, BAP1, BARD1, BLM, BMPR1A, BRCA1, BRCA2, BRIP1, CDC73, CDH1, CDK4, CDKN1B, CDKN2A, CHEK2, CTNNA1, DICER1, FANCC, FH, FLCN, GALNT12, KIF1B, LZTR1, MAX, MEN1, MET, MLH1, MSH2, MSH3, MSH6, MUTYH, NBN, NF1, NF2, NTHL1, PALB2, PHOX2B, PMS2, POT1, PRKAR1A, PTCH1, PTEN, RAD51C, RAD51D, RB1, RECQL, RET, SDHA, SDHAF2, SDHB, SDHC, SDHD, SMAD4, SMARCA4, SMARCB1, SMARCE1, STK11, SUFU, TMEM127, TP53, TSC1, TSC2, VHL and XRCC2 (sequencing and deletion/duplication); EGFR, EGLN1, HOXB13, KIT, MITF, PDGFRA, POLD1, and POLE (sequencing only); EPCAM and GREM1 (deletion/duplication only).    05/01/2022 -  Chemotherapy   Patient is on Treatment Plan : BREAST ADO-Trastuzumab Emtansine (Kadcyla) q21d      Interval history-: Tami Schmidt is a 89 y.o. female with oncologic history as above seen today for toxicity check prior to Kadcyla.  She had her CT scan on 5.21, stable disease.  She now continues on Kadcyla.  She had a GI bleed since her last visit, received packed red blood cells and is here for follow-up.  She continues to feel tired, otherwise denies any major complaints.  No neuropathy that is persistent, the fire sensation in her legs is only intermittent.  She still continues  to be very independent.  She denies any changes in breathing, bowel habits or urinary habits.  Since her hospital discharge she had a normal bowel movement without any blood.  Allergies  Allergen Reactions   Bactrim Swelling    SWELLING OF MOUTH/FACE.   Lisinopril Swelling    SWELLING OF  MOUTH/FACE.   Vasotec Swelling    SWELLING OF MOUTH/FACE.   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   Diabetes mellitus    fasting 90-100   HTN (hypertension) 03/25/2013   Hx of radiation therapy    breasts hx   Hypercholesterolemia    Hypertension    Lymphedema of arm    left arm   Pulmonary embolism (HCC)    Type II or unspecified type diabetes mellitus without mention of complication, not stated as uncontrolled 03/25/2013     Past Surgical History:  Procedure Laterality Date   ABDOMINAL HYSTERECTOMY     ANKLE SURGERY Right 1995   APPENDECTOMY     BIOPSY  01/07/2018   Procedure: BIOPSY;  Surgeon: Tami Salen, MD;  Location: [facility];  Service: Gastroenterology;;   BREAST SURGERY Bilateral    mastectomy   COLONOSCOPY WITH PROPOFOL N/A 01/07/2018   Procedure: COLONOSCOPY WITH PROPOFOL;  Surgeon: Tami Salen, MD;  Location: [facility];  Service: Gastroenterology;  Laterality: N/A;   ESOPHAGOGASTRODUODENOSCOPY (EGD) WITH PROPOFOL N/A 01/06/2018   Procedure: ESOPHAGOGASTRODUODENOSCOPY (EGD) WITH PROPOFOL;  Surgeon: Tami Salen, MD;  Location: [facility];  Service: Gastroenterology;  Laterality: N/A;   HERNIA REPAIR  04-26-2010   IR IMAGING GUIDED PORT INSERTION  10/29/2021   MASS EXCISION Left 01/26/2013   Procedure: EXCISION LEFT CHEST WALL MASS AND LEFT ABDOMNAL WALL MASS;  Surgeon: Tami Rubenstein, MD;  Location: [facility];  Service: General;  Laterality: Left;   MASS EXCISION Left 09/20/2014   Procedure: EXCISION OF LEFT CHEST WALL MASS;  Surgeon: Tami Miyamoto, MD;  Location: [facility];  Service: General;  Laterality: Left;   TEE WITHOUT CARDIOVERSION N/A 12/08/2017   Procedure: TRANSESOPHAGEAL ECHOCARDIOGRAM (TEE);  Surgeon: Tami Bunting, MD;   Location: [facility];  Service: Cardiovascular;  Laterality: N/A;   TOTAL HIP ARTHROPLASTY Right 01/05/2017   TOTAL HIP ARTHROPLASTY Right 01/05/2017   Procedure: TOTAL HIP ARTHROPLASTY ANTERIOR APPROACH;  Surgeon: Gean Birchwood, MD;  Location: [facility];  Service: Orthopedics;  Laterality: Right;    Social History   Socioeconomic History   Marital status: Married    Spouse name: Not on file   Number of children: Not on file   Years of education: Not on file   Highest education level: Not on file  Occupational History   Occupation: retired Child psychotherapist  Tobacco Use   Smoking status: Former    Types: Cigarettes    Quit date: 04/14/1972    Years since quitting: 50.5   Smokeless tobacco: Never  Vaping Use   Vaping Use: Never used  Substance and Sexual Activity   Alcohol use: Yes    Comment: occasional   Drug use: Not Currently   Sexual activity: Yes    Birth control/protection: Surgical  Other Topics Concern   Not on file  Social History Narrative   Not on file   Social Determinants of  Health   Financial Resource Strain: Not on file  Food Insecurity: No Food Insecurity (10/05/2022)   Hunger Vital Sign    Worried About Running Out of Food in the Last Year: Never true    Ran Out of Food in the Last Year: Never true  Transportation Needs: No Transportation Needs (10/05/2022)   PRAPARE - Administrator, Civil Service (Medical): No    Lack of Transportation (Non-Medical): No  Physical Activity: Not on file  Stress: Not on file  Social Connections: Not on file  Intimate Partner Violence: Not At Risk (10/05/2022)   Humiliation, Afraid, Rape, and Kick questionnaire    Fear of Current or Ex-Partner: No    Emotionally Abused: No    Physically Abused: No    Sexually Abused: No    Family History  Problem Relation Age of Onset   Breast cancer Cousin        maternal first cousin   Uterine cancer Cousin        maternal first cousin     Current Outpatient  Medications:    amLODipine (NORVASC) 10 MG tablet, Take 10 mg by mouth daily., Disp: , Rfl:    apixaban (ELIQUIS) 5 MG TABS tablet, Take 1 tablet (5 mg total) by mouth 2 (two) times daily., Disp: 180 tablet, Rfl: 0   metFORMIN (GLUCOPHAGE) 500 MG tablet, Take 500 mg by mouth daily with breakfast., Disp: , Rfl:    olmesartan-hydrochlorothiazide (BENICAR HCT) 40-12.5 MG tablet, Take 1 tablet by mouth daily., Disp: , Rfl:    SM IRON 325 (65 Fe) MG tablet, Take 325 mg by mouth daily., Disp: , Rfl:    Turmeric (QC TUMERIC COMPLEX PO), Take 1 tablet by mouth daily., Disp: , Rfl:    TYLENOL 325 MG tablet, Take 325 mg by mouth daily., Disp: , Rfl:    VITAMIN D PO, Take 1 tablet by mouth daily., Disp: , Rfl:   PHYSICAL EXAM: ECOG FS:1 - Symptomatic but completely ambulatory    Vitals:   10/15/22 1244  BP: (!) 157/46  Pulse: 81  Resp: 18  Temp: 98 F (36.7 C)  TempSrc: Oral  SpO2: 100%  Weight: 179 lb 3.2 oz (81.3 kg)     Physical Exam Vitals and nursing note reviewed.  Constitutional:      Appearance: She is well-developed. She is not ill-appearing or toxic-appearing.  HENT:     Head: Normocephalic.     Nose: Nose normal.  Eyes:     Conjunctiva/sclera: Conjunctivae normal.  Neck:     Vascular: No JVD.  Cardiovascular:     Rate and Rhythm: Rhythm irregular.     Pulses:          Radial pulses are 2+ on the right side and 2+ on the left side.  Pulmonary:     Effort: Pulmonary effort is normal.     Breath sounds: Normal breath sounds.  Chest:     Comments: Axilla exam challenging, pt can't lift her arms  Abdominal:     General: There is no distension.  Musculoskeletal:     Cervical back: Normal range of motion.     Comments: LUE lymphedema  Skin:    General: Skin is warm and dry.     Capillary Refill: Capillary refill takes less than 2 seconds.  Neurological:     Mental Status: She is oriented to person, place, and time.        LABORATORY DATA: I have reviewed the  data as listed    Latest Ref Rng & Units 10/07/2022    1:05 AM 10/06/2022    1:45 PM 10/06/2022    1:14 AM  CBC  WBC 4.0 - 10.5 K/uL 7.8   7.7   Hemoglobin 12.0 - 15.0 g/dL 7.9  8.4  7.0   Hematocrit 36.0 - 46.0 % 24.8  26.6  21.6   Platelets 150 - 400 K/uL 113   101         Latest Ref Rng & Units 10/05/2022    4:30 AM 10/04/2022    2:42 AM 09/25/2022   10:15 AM  CMP  Glucose 70 - 99 mg/dL 191  478  295   BUN 8 - 23 mg/dL 28  26  16    Creatinine 0.44 - 1.00 mg/dL 6.21  3.08  6.57   Sodium 135 - 145 mmol/L 136  136  138   Potassium 3.5 - 5.1 mmol/L 3.3  3.5  3.6   Chloride 98 - 111 mmol/L 103  99  102   CO2 22 - 32 mmol/L 24  25  26    Calcium 8.9 - 10.3 mg/dL 8.5  9.1  9.5   Total Protein 6.5 - 8.1 g/dL  6.2  7.5   Total Bilirubin 0.3 - 1.2 mg/dL  0.4  0.5   Alkaline Phos 38 - 126 U/L  50  49   AST 15 - 41 U/L  23  19   ALT 0 - 44 U/L  13  10      RADIOGRAPHIC STUDIES (from last 24 hours if applicable) I have personally reviewed the radiological images as listed and agreed with the findings in the report. No results found.    Visit Diagnosis: 1. Malignant neoplasm of lower-inner quadrant of left breast in female, estrogen receptor positive (HCC)      No orders of the defined types were placed in this encounter.   All questions were answered. The patient knows to call the clinic with any problems, questions or concerns. No barriers to learning was detected.  I have spent a total of 30 minutes minutes of face-to-face and non-face-to-face time, preparing to see the patient, obtaining and/or reviewing separately obtained history, performing a medically appropriate examination, counseling and educating the patient, ordering tests, documenting clinical information in the electronic health record, and care coordination (communications with other health care professionals or caregivers).   Thank you for allowing me to participate in the care of this patient.    Tami Moulds, MD Department of Hematology/Oncology [facility] Phone: [phone]  [phone]

## 2022-10-17 LAB — CANCER ANTIGEN 27.29: CA 27.29: 24.9 U/mL (ref 0.0–38.6)

## 2022-11-03 ENCOUNTER — Other Ambulatory Visit: Payer: Self-pay

## 2022-11-06 ENCOUNTER — Inpatient Hospital Stay: Payer: Medicare Other

## 2022-11-06 ENCOUNTER — Inpatient Hospital Stay (HOSPITAL_BASED_OUTPATIENT_CLINIC_OR_DEPARTMENT_OTHER): Payer: Medicare Other | Admitting: Hematology and Oncology

## 2022-11-06 VITALS — BP 147/64 | HR 77 | Temp 97.7°F | Resp 18 | Ht 67.0 in | Wt 179.5 lb

## 2022-11-06 DIAGNOSIS — C50312 Malignant neoplasm of lower-inner quadrant of left female breast: Secondary | ICD-10-CM | POA: Diagnosis not present

## 2022-11-06 DIAGNOSIS — Z170 Estrogen receptor positive status [ER+]: Secondary | ICD-10-CM

## 2022-11-06 DIAGNOSIS — Z5112 Encounter for antineoplastic immunotherapy: Secondary | ICD-10-CM | POA: Diagnosis not present

## 2022-11-06 DIAGNOSIS — C50919 Malignant neoplasm of unspecified site of unspecified female breast: Secondary | ICD-10-CM

## 2022-11-06 DIAGNOSIS — Z95828 Presence of other vascular implants and grafts: Secondary | ICD-10-CM

## 2022-11-06 LAB — CMP (CANCER CENTER ONLY)
ALT: 13 U/L (ref 0–44)
AST: 23 U/L (ref 15–41)
Albumin: 3.8 g/dL (ref 3.5–5.0)
Alkaline Phosphatase: 58 U/L (ref 38–126)
Anion gap: 7 (ref 5–15)
BUN: 16 mg/dL (ref 8–23)
CO2: 27 mmol/L (ref 22–32)
Calcium: 9.3 mg/dL (ref 8.9–10.3)
Chloride: 105 mmol/L (ref 98–111)
Creatinine: 0.72 mg/dL (ref 0.44–1.00)
GFR, Estimated: >60 mL/min (ref >60)
Glucose, Bld: 218 mg/dL — ABNORMAL HIGH (ref 70–99)
Potassium: 3.8 mmol/L (ref 3.5–5.1)
Sodium: 139 mmol/L (ref 135–145)
Total Bilirubin: 0.4 mg/dL (ref 0.3–1.2)
Total Protein: 6.7 g/dL (ref 6.5–8.1)

## 2022-11-06 LAB — CBC WITH DIFFERENTIAL (CANCER CENTER ONLY)
Abs Immature Granulocytes: 0.02 10*3/uL (ref 0.00–0.07)
Basophils Absolute: 0.1 10*3/uL (ref 0.0–0.1)
Basophils Relative: 1 %
Eosinophils Absolute: 0.2 10*3/uL (ref 0.0–0.5)
Eosinophils Relative: 3 %
HCT: 30.9 % — ABNORMAL LOW (ref 36.0–46.0)
Hemoglobin: 9.7 g/dL — ABNORMAL LOW (ref 12.0–15.0)
Immature Granulocytes: 0 %
Lymphocytes Relative: 20 %
Lymphs Abs: 1.3 10*3/uL (ref 0.7–4.0)
MCH: 25.3 pg — ABNORMAL LOW (ref 26.0–34.0)
MCHC: 31.4 g/dL (ref 30.0–36.0)
MCV: 80.7 fL (ref 80.0–100.0)
Monocytes Absolute: 0.4 10*3/uL (ref 0.1–1.0)
Monocytes Relative: 6 %
Neutro Abs: 4.7 10*3/uL (ref 1.7–7.7)
Neutrophils Relative %: 70 %
Platelet Count: 218 10*3/uL (ref 150–400)
RBC: 3.83 MIL/uL — ABNORMAL LOW (ref 3.87–5.11)
RDW: 17.7 % — ABNORMAL HIGH (ref 11.5–15.5)
WBC Count: 6.7 10*3/uL (ref 4.0–10.5)
nRBC: 0 % (ref 0.0–0.2)

## 2022-11-06 MED ORDER — HEPARIN SOD (PORK) LOCK FLUSH 100 UNIT/ML IV SOLN
500.0000 [IU] | Freq: Once | INTRAVENOUS | Status: AC
  Administered 2022-11-06: 500 [IU] via INTRAVENOUS

## 2022-11-06 MED ORDER — SODIUM CHLORIDE 0.9% FLUSH
10.0000 mL | Freq: Once | INTRAVENOUS | Status: AC
  Administered 2022-11-06: 10 mL

## 2022-11-06 MED ORDER — SODIUM CHLORIDE 0.9% FLUSH
10.0000 mL | Freq: Once | INTRAVENOUS | Status: AC
  Administered 2022-11-06: 10 mL via INTRAVENOUS

## 2022-11-06 NOTE — Progress Notes (Signed)
Pt reported to infusion today for D1C10 Geroge Baseman, last ECHO from 07/18/2022 left EF 60-65%, per Dr.Iruku HOLD tx today. Pt to have updated ECHO completed next week followed by tx as indicated. This RN informed Pt of Dr. Remonia Richter recommendation to hold tx. Pt stated, "why are you telling me this and not my doctor?". Pt was not happy about holding tx, this RN provided the Pt comfort and emphasized to the Pt that is in the Pt's best interest to have and updated ECHO prior to tx. This RN informed the Pt that Val RN would call with information regarding appt for ECHO once scheduled. Pt verbalized understanding and expressed gratitude towards this RN. This RN de-accessed Pt's PAC. Pt ambulatory to lobby in stable condition.

## 2022-11-06 NOTE — Progress Notes (Signed)
[facility]    Patient Care Team: Patient, No Pcp Per as PCP - General (General Practice) Otho Darner, MD as Consulting Physician (Family Medicine) Donnelly Angelica, RN as Oncology Nurse Navigator Pershing Proud, RN as Oncology Nurse Navigator Rachel Moulds, MD as Consulting Physician (Hematology and Oncology)   Name of the patient: Tami Schmidt  [id number]  [birth year]   Date of visit: 11/06/2022   Chief Complaint/Reason for visit: toxicity check  Current Therapy: Kadcyla  ASSESSMENT & PLAN:   Patient is a 89 y.o. 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 Patient is currently on second line Kadcyla.  For first-line she received Herceptin and pertuzumab alone given her age and comorbidities.  She is doing well overall.  Since last visit here, she has stopped taking eliquis and didn't have any more blood in stool. She tells me that kadcyla may be blunting the taste buds.  She denied any chest pain or chest pressure but has not had an echocardiogram in over 3 months.  Hence we have discussed about delaying the treatment by a week, repeating echo and restarting treatment next week if all parameters are within normal limits.  There is no concern for disease progression on clinical exam and from review of systems.  I have sent an in basket message to our scheduler to reschedule today's Kadcyla by a week and adjust the future treatments accordingly.  We hope to repeat the scan in September  She does complain of some neuropathy at night but absolutely denies any issues during morning.  RTC in 3 weeks. Heme/Onc History: Oncology History  Malignant neoplasm of lower-inner quadrant of left breast in female, estrogen receptor positive (HCC)  06/16/2013 Initial Diagnosis   Malignant neoplasm of lower-inner quadrant of left breast in female, estrogen receptor  positive (HCC)   05/30/2021 - 04/03/2022 Chemotherapy   Patient is on Treatment Plan : BREAST Trastuzumab q28d      Genetic Testing   [facility] CancerNext-Expanded Panel was Negative. 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 Report date is 07/22/2021.  The CancerNext-Expanded gene panel offered by [facility] and includes sequencing, rearrangement, and RNA analysis for the following 77 genes: AIP, ALK, APC, ATM, AXIN2, BAP1, BARD1, BLM, BMPR1A, BRCA1, BRCA2, BRIP1, CDC73, CDH1, CDK4, CDKN1B, CDKN2A, CHEK2, CTNNA1, DICER1, FANCC, FH, FLCN, GALNT12, KIF1B, LZTR1, MAX, MEN1, MET, MLH1, MSH2, MSH3, MSH6, MUTYH, NBN, NF1, NF2, NTHL1, PALB2, PHOX2B, PMS2, POT1, PRKAR1A, PTCH1, PTEN, RAD51C, RAD51D, RB1, RECQL, RET, SDHA, SDHAF2, SDHB, SDHC, SDHD, SMAD4, SMARCA4, SMARCB1, SMARCE1, STK11, SUFU, TMEM127, TP53, TSC1, TSC2, VHL and XRCC2 (sequencing and deletion/duplication); EGFR, EGLN1, HOXB13, KIT, MITF, PDGFRA, POLD1, and POLE (sequencing only); EPCAM and GREM1 (deletion/duplication only).    05/01/2022 -  Chemotherapy   Patient is on Treatment Plan : BREAST ADO-Trastuzumab Emtansine (Kadcyla) q21d      Interval history-: Tami Schmidt is a 89 y.o. female with oncologic history as above seen today for toxicity check prior to Kadcyla.  Since her last visit, she denies any more GI bleeds.  She continues to have some neuropathy which is described as an intermittent fire like sensation in the legs mostly at night.  She denies any pain during the daytime.  She is still active and does all her activities of daily living by herself.  She denies any chest pain or chest  pressure.  She has noticed some dysgeusia and wonders if it is from the [place].  Rest of the pertinent 10 point ROS reviewed and negative  Allergies  Allergen Reactions   Bactrim Swelling    SWELLING OF MOUTH/FACE.   Lisinopril Swelling    SWELLING OF MOUTH/FACE.   Vasotec Swelling    SWELLING OF  MOUTH/FACE.   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   Diabetes mellitus    fasting 90-100   HTN (hypertension) 03/25/2013   Hx of radiation therapy    breasts hx   Hypercholesterolemia    Hypertension    Lymphedema of arm    left arm   Pulmonary embolism (HCC)    Type II or unspecified type diabetes mellitus without mention of complication, not stated as uncontrolled 03/25/2013     Past Surgical History:  Procedure Laterality Date   ABDOMINAL HYSTERECTOMY     ANKLE SURGERY Right 1995   APPENDECTOMY     BIOPSY  01/07/2018   Procedure: BIOPSY;  Surgeon: Kerin Salen, MD;  Location: [facility];  Service: Gastroenterology;;   BREAST SURGERY Bilateral    mastectomy   COLONOSCOPY WITH PROPOFOL N/A 01/07/2018   Procedure: COLONOSCOPY WITH PROPOFOL;  Surgeon: Kerin Salen, MD;  Location: [facility];  Service: Gastroenterology;  Laterality: N/A;   ESOPHAGOGASTRODUODENOSCOPY (EGD) WITH PROPOFOL N/A 01/06/2018   Procedure: ESOPHAGOGASTRODUODENOSCOPY (EGD) WITH PROPOFOL;  Surgeon: Kerin Salen, MD;  Location: [facility];  Service: Gastroenterology;  Laterality: N/A;   HERNIA REPAIR  04-26-2010   IR IMAGING GUIDED PORT INSERTION  10/29/2021   MASS EXCISION Left 01/26/2013   Procedure: EXCISION LEFT CHEST WALL MASS AND LEFT ABDOMNAL WALL MASS;  Surgeon: [name], MD;  Location: [facility];  Service: General;  Laterality: Left;   MASS EXCISION Left 09/20/2014   Procedure: EXCISION OF LEFT CHEST WALL MASS;  Surgeon: [name], MD;  Location:  SURGERY CENTER;  Service: General;  Laterality: Left;   TEE WITHOUT CARDIOVERSION N/A 12/08/2017   Procedure: TRANSESOPHAGEAL ECHOCARDIOGRAM (TEE);  Surgeon: Lewayne Bunting, MD;  Location: [facility] ENDOSCOPY;  Service:  Cardiovascular;  Laterality: N/A;   TOTAL HIP ARTHROPLASTY Right 01/05/2017   TOTAL HIP ARTHROPLASTY Right 01/05/2017   Procedure: TOTAL HIP ARTHROPLASTY ANTERIOR APPROACH;  Surgeon: Gean Birchwood, MD;  Location: [facility] OR;  Service: Orthopedics;  Laterality: Right;    Social History   Socioeconomic History   Marital status: Married    Spouse name: Not on file   Number of children: Not on file   Years of education: Not on file   Highest education level: Not on file  Occupational History   Occupation: retired Child psychotherapist  Tobacco Use   Smoking status: Former    Current packs/day: 0.00    Types: Cigarettes    Quit date: 04/14/1972    Years since quitting: 50.5   Smokeless tobacco: Never  Vaping Use   Vaping status: Never Used  Substance and Sexual Activity   Alcohol use: Yes    Comment: occasional   Drug use: Not Currently   Sexual activity: Yes    Birth control/protection: Surgical  Other Topics Concern   Not on file  Social History Narrative   Not on file   Social  Determinants of Health   Financial Resource Strain: Not on file  Food Insecurity: No Food Insecurity (10/05/2022)   Hunger Vital Sign    Worried About Running Out of Food in the Last Year: Never true    Ran Out of Food in the Last Year: Never true  Transportation Needs: No Transportation Needs (10/05/2022)   PRAPARE - Administrator, Civil Service (Medical): No    Lack of Transportation (Non-Medical): No  Physical Activity: Not on file  Stress: Not on file  Social Connections: Not on file  Intimate Partner Violence: Not At Risk (10/05/2022)   Humiliation, Afraid, Rape, and Kick questionnaire    Fear of Current or Ex-Partner: No    Emotionally Abused: No    Physically Abused: No    Sexually Abused: No    Family History  Problem Relation Age of Onset   Breast cancer Cousin        maternal first cousin   Uterine cancer Cousin        maternal first cousin     Current Outpatient Medications:     amLODipine (NORVASC) 10 MG tablet, Take 10 mg by mouth daily., Disp: , Rfl:    apixaban (ELIQUIS) 5 MG TABS tablet, Take 1 tablet (5 mg total) by mouth 2 (two) times daily., Disp: 180 tablet, Rfl: 0   metFORMIN (GLUCOPHAGE) 500 MG tablet, Take 500 mg by mouth daily with breakfast., Disp: , Rfl:    olmesartan-hydrochlorothiazide (BENICAR HCT) 40-12.5 MG tablet, Take 1 tablet by mouth daily., Disp: , Rfl:    SM IRON 325 (65 Fe) MG tablet, Take 325 mg by mouth daily., Disp: , Rfl:    Turmeric (QC TUMERIC COMPLEX PO), Take 1 tablet by mouth daily., Disp: , Rfl:    TYLENOL 325 MG tablet, Take 325 mg by mouth daily., Disp: , Rfl:    VITAMIN D PO, Take 1 tablet by mouth daily., Disp: , Rfl:   PHYSICAL EXAM: ECOG FS:1 - Symptomatic but completely ambulatory    Vitals:   11/06/22 0928  BP: (!) 147/64  Pulse: 77  Resp: 18  Temp: 97.7 F (36.5 C)  TempSrc: Tympanic  SpO2: 98%  Weight: 179 lb 8 oz (81.4 kg)  Height: 5\' 7"  (1.702 m)     Physical Exam Vitals and nursing note reviewed.  Constitutional:      Appearance: She is well-developed. She is not ill-appearing or toxic-appearing.  HENT:     Head: Normocephalic.     Nose: Nose normal.  Eyes:     Conjunctiva/sclera: Conjunctivae normal.  Neck:     Vascular: No JVD.  Cardiovascular:     Rate and Rhythm: Rhythm irregular.  Pulmonary:     Effort: Pulmonary effort is normal.     Breath sounds: Normal breath sounds.  Chest:     Comments: Axilla exam challenging, pt can't lift her arms, within the limits of exam there is no concern for clinical progression. Abdominal:     General: There is no distension.  Musculoskeletal:     Cervical back: Normal range of motion.     Comments: LUE lymphedema  Skin:    General: Skin is warm and dry.     Capillary Refill: Capillary refill takes less than 2 seconds.  Neurological:     Mental Status: She is oriented to person, place, and time.        LABORATORY DATA: I have reviewed the  data as listed    Latest Ref Rng &  Units 11/06/2022    8:57 AM 10/15/2022    1:24 PM 10/07/2022    1:05 AM  CBC  WBC 4.0 - 10.5 K/uL 6.7  8.5  7.8   Hemoglobin 12.0 - 15.0 g/dL 9.7  9.2  7.9   Hematocrit 36.0 - 46.0 % 30.9  29.3  24.8   Platelets 150 - 400 K/uL 218  303  113         Latest Ref Rng & Units 10/15/2022    1:24 PM 10/05/2022    4:30 AM 10/04/2022    2:42 AM  CMP  Glucose 70 - 99 mg/dL 95  409  811   BUN 8 - 23 mg/dL 12  28  26    Creatinine 0.44 - 1.00 mg/dL 9.14  7.82  9.56   Sodium 135 - 145 mmol/L 139  136  136   Potassium 3.5 - 5.1 mmol/L 3.8  3.3  3.5   Chloride 98 - 111 mmol/L 104  103  99   CO2 22 - 32 mmol/L 25  24  25    Calcium 8.9 - 10.3 mg/dL 9.3  8.5  9.1   Total Protein 6.5 - 8.1 g/dL 6.6   6.2   Total Bilirubin 0.3 - 1.2 mg/dL 0.4   0.4   Alkaline Phos 38 - 126 U/L 55   50   AST 15 - 41 U/L 18   23   ALT 0 - 44 U/L 9   13      RADIOGRAPHIC STUDIES (from last 24 hours if applicable) I have personally reviewed the radiological images as listed and agreed with the findings in the report. No results found.    Visit Diagnosis: 1. Malignant neoplasm of lower-inner quadrant of left breast in female, estrogen receptor positive (HCC)      No orders of the defined types were placed in this encounter.   All questions were answered. The patient knows to call the clinic with any problems, questions or concerns. No barriers to learning was detected.  I have spent a total of 30 minutes minutes of face-to-face and non-face-to-face time, preparing to see the patient, obtaining and/or reviewing separately obtained history, performing a medically appropriate examination, counseling and educating the patient, ordering tests, documenting clinical information in the electronic health record, and care coordination (communications with other health care professionals or caregivers).   Thank you for allowing me to participate in the care of this patient.    Rachel Moulds, MD Department of Hematology/Oncology [facility] at [facility] Phone: [phone]  [phone]

## 2022-11-06 NOTE — Assessment & Plan Note (Signed)
Tami Schmidt Mrs. 89 year old woman who is here today for follow-up of her triple positive breast cancer recurrence in her left axilla, subpectoral, and supraclavicular lymph nodes.  1.  Recurrent regional triple positive breast cancer recurrence: She is on second line Kadcyla, tolerating this extremely well.  She does not have some neuropathy which is mostly reported at night.  This does not limit her from doing activities of daily living during the day.  We will plan to repeat imaging in September.  Last imaging showed stable disease overall.  We talked about radiation and taking a hiatus from treatment but she did not want to proceed with radiation.  2.  Left arm lymphedema.  This has overall improved.  She will continue to do physical therapy.  3.  Grade 1 neuropathy, we will continue to monitor this  She is overdue for an echocardiogram, plan to repeat this in the next week and restart treatment.  Tami Schmidt will return in 3 weeks for labs, follow-up, and Kadcyla.

## 2022-11-07 ENCOUNTER — Other Ambulatory Visit: Payer: Self-pay

## 2022-11-09 ENCOUNTER — Other Ambulatory Visit: Payer: Self-pay

## 2022-11-10 ENCOUNTER — Ambulatory Visit (HOSPITAL_COMMUNITY)
Admission: RE | Admit: 2022-11-10 | Discharge: 2022-11-10 | Disposition: A | Discharge status: Home / Self Care | Payer: Medicare Other | Source: Ambulatory Visit | Attending: Hematology and Oncology | Admitting: Hematology and Oncology

## 2022-11-10 DIAGNOSIS — Z170 Estrogen receptor positive status [ER+]: Secondary | ICD-10-CM

## 2022-11-10 DIAGNOSIS — E119 Type 2 diabetes mellitus without complications: Secondary | ICD-10-CM | POA: Insufficient documentation

## 2022-11-10 DIAGNOSIS — I10 Essential (primary) hypertension: Secondary | ICD-10-CM | POA: Insufficient documentation

## 2022-11-10 DIAGNOSIS — Z0189 Encounter for other specified special examinations: Secondary | ICD-10-CM | POA: Diagnosis not present

## 2022-11-10 DIAGNOSIS — Z01818 Encounter for other preprocedural examination: Secondary | ICD-10-CM | POA: Diagnosis present

## 2022-11-10 DIAGNOSIS — I2699 Other pulmonary embolism without acute cor pulmonale: Secondary | ICD-10-CM | POA: Diagnosis not present

## 2022-11-10 DIAGNOSIS — I3481 Nonrheumatic mitral (valve) annulus calcification: Secondary | ICD-10-CM | POA: Diagnosis not present

## 2022-11-10 DIAGNOSIS — C50312 Malignant neoplasm of lower-inner quadrant of left female breast: Secondary | ICD-10-CM | POA: Diagnosis not present

## 2022-11-10 LAB — ECHOCARDIOGRAM COMPLETE
AR max vel: 2.02 cm2
AV Peak grad: 23.8 mmHg
Ao pk vel: 2.44 m/s
Area-P 1/2: 3.72 cm2
S' Lateral: 3.2 cm

## 2022-11-10 NOTE — Progress Notes (Signed)
  Echocardiogram 2D Echocardiogram has been performed.  Janalyn Harder 11/10/2022, 3:41 PM

## 2022-11-11 ENCOUNTER — Other Ambulatory Visit: Payer: Self-pay | Admitting: *Deleted

## 2022-11-11 DIAGNOSIS — C50919 Malignant neoplasm of unspecified site of unspecified female breast: Secondary | ICD-10-CM

## 2022-11-11 DIAGNOSIS — Z170 Estrogen receptor positive status [ER+]: Secondary | ICD-10-CM

## 2022-11-12 ENCOUNTER — Encounter: Payer: Self-pay | Admitting: Podiatry

## 2022-11-12 ENCOUNTER — Ambulatory Visit: Payer: Medicare Other | Admitting: Podiatry

## 2022-11-12 DIAGNOSIS — E1142 Type 2 diabetes mellitus with diabetic polyneuropathy: Secondary | ICD-10-CM

## 2022-11-12 DIAGNOSIS — L84 Corns and callosities: Secondary | ICD-10-CM | POA: Diagnosis not present

## 2022-11-12 DIAGNOSIS — B351 Tinea unguium: Secondary | ICD-10-CM

## 2022-11-12 DIAGNOSIS — M79675 Pain in left toe(s): Secondary | ICD-10-CM | POA: Diagnosis not present

## 2022-11-12 DIAGNOSIS — M79674 Pain in right toe(s): Secondary | ICD-10-CM

## 2022-11-13 ENCOUNTER — Other Ambulatory Visit: Payer: Self-pay

## 2022-11-13 ENCOUNTER — Inpatient Hospital Stay: Payer: Medicare Other

## 2022-11-13 ENCOUNTER — Inpatient Hospital Stay: Payer: Medicare Other | Attending: Adult Health

## 2022-11-13 VITALS — BP 135/51 | HR 72 | Temp 97.6°F | Resp 20 | Ht 67.0 in | Wt 179.5 lb

## 2022-11-13 DIAGNOSIS — Z170 Estrogen receptor positive status [ER+]: Secondary | ICD-10-CM | POA: Insufficient documentation

## 2022-11-13 DIAGNOSIS — Z95828 Presence of other vascular implants and grafts: Secondary | ICD-10-CM

## 2022-11-13 DIAGNOSIS — Z5112 Encounter for antineoplastic immunotherapy: Secondary | ICD-10-CM | POA: Insufficient documentation

## 2022-11-13 DIAGNOSIS — C773 Secondary and unspecified malignant neoplasm of axilla and upper limb lymph nodes: Secondary | ICD-10-CM | POA: Diagnosis not present

## 2022-11-13 DIAGNOSIS — C50919 Malignant neoplasm of unspecified site of unspecified female breast: Secondary | ICD-10-CM

## 2022-11-13 DIAGNOSIS — C50312 Malignant neoplasm of lower-inner quadrant of left female breast: Secondary | ICD-10-CM | POA: Insufficient documentation

## 2022-11-13 LAB — CMP (CANCER CENTER ONLY)
ALT: 13 U/L (ref 0–44)
AST: 23 U/L (ref 15–41)
Albumin: 3.9 g/dL (ref 3.5–5.0)
Alkaline Phosphatase: 59 U/L (ref 38–126)
Anion gap: 7 (ref 5–15)
BUN: 18 mg/dL (ref 8–23)
CO2: 28 mmol/L (ref 22–32)
Calcium: 9.3 mg/dL (ref 8.9–10.3)
Chloride: 104 mmol/L (ref 98–111)
Creatinine: 0.75 mg/dL (ref 0.44–1.00)
GFR, Estimated: >60 mL/min (ref >60)
Glucose, Bld: 103 mg/dL — ABNORMAL HIGH (ref 70–99)
Potassium: 3.9 mmol/L (ref 3.5–5.1)
Sodium: 139 mmol/L (ref 135–145)
Total Bilirubin: 0.4 mg/dL (ref 0.3–1.2)
Total Protein: 7.1 g/dL (ref 6.5–8.1)

## 2022-11-13 LAB — CBC WITH DIFFERENTIAL (CANCER CENTER ONLY)
Abs Immature Granulocytes: 0.01 10*3/uL (ref 0.00–0.07)
Basophils Absolute: 0.1 10*3/uL (ref 0.0–0.1)
Basophils Relative: 1 %
Eosinophils Absolute: 0.2 10*3/uL (ref 0.0–0.5)
Eosinophils Relative: 3 %
HCT: 31.5 % — ABNORMAL LOW (ref 36.0–46.0)
Hemoglobin: 10.2 g/dL — ABNORMAL LOW (ref 12.0–15.0)
Immature Granulocytes: 0 %
Lymphocytes Relative: 31 %
Lymphs Abs: 2 10*3/uL (ref 0.7–4.0)
MCH: 25.8 pg — ABNORMAL LOW (ref 26.0–34.0)
MCHC: 32.4 g/dL (ref 30.0–36.0)
MCV: 79.5 fL — ABNORMAL LOW (ref 80.0–100.0)
Monocytes Absolute: 0.5 10*3/uL (ref 0.1–1.0)
Monocytes Relative: 8 %
Neutro Abs: 3.8 10*3/uL (ref 1.7–7.7)
Neutrophils Relative %: 57 %
Platelet Count: 235 10*3/uL (ref 150–400)
RBC: 3.96 MIL/uL (ref 3.87–5.11)
RDW: 17.2 % — ABNORMAL HIGH (ref 11.5–15.5)
WBC Count: 6.5 10*3/uL (ref 4.0–10.5)
nRBC: 0 % (ref 0.0–0.2)

## 2022-11-13 MED ORDER — SODIUM CHLORIDE 0.9% FLUSH
10.0000 mL | INTRAVENOUS | Status: DC | PRN
  Administered 2022-11-13: 10 mL

## 2022-11-13 MED ORDER — SODIUM CHLORIDE 0.9% FLUSH
10.0000 mL | Freq: Once | INTRAVENOUS | Status: AC
  Administered 2022-11-13: 10 mL

## 2022-11-13 MED ORDER — SODIUM CHLORIDE 0.9 % IV SOLN
3.6000 mg/kg | Freq: Once | INTRAVENOUS | Status: AC
  Administered 2022-11-13: 320 mg via INTRAVENOUS
  Filled 2022-11-13: qty 16

## 2022-11-13 MED ORDER — PROCHLORPERAZINE MALEATE 10 MG PO TABS
10.0000 mg | ORAL_TABLET | Freq: Once | ORAL | Status: AC
  Administered 2022-11-13: 10 mg via ORAL
  Filled 2022-11-13: qty 1

## 2022-11-13 MED ORDER — HEPARIN SOD (PORK) LOCK FLUSH 100 UNIT/ML IV SOLN
500.0000 [IU] | Freq: Once | INTRAVENOUS | Status: AC | PRN
  Administered 2022-11-13: 500 [IU]

## 2022-11-13 MED ORDER — ACETAMINOPHEN 325 MG PO TABS
650.0000 mg | ORAL_TABLET | Freq: Once | ORAL | Status: AC
  Administered 2022-11-13: 650 mg via ORAL
  Filled 2022-11-13: qty 2

## 2022-11-13 MED ORDER — DIPHENHYDRAMINE HCL 25 MG PO CAPS
50.0000 mg | ORAL_CAPSULE | Freq: Once | ORAL | Status: AC
  Administered 2022-11-13: 50 mg via ORAL
  Filled 2022-11-13: qty 2

## 2022-11-13 MED ORDER — SODIUM CHLORIDE 0.9 % IV SOLN: Freq: Once | INTRAVENOUS | Status: AC

## 2022-11-13 NOTE — Patient Instructions (Signed)
[place] [facility] AT [facility]  Discharge Instructions: Thank you for choosing [facility] to provide your oncology and hematology care.   If you have a lab appointment with the Cancer Center, please go directly to the Cancer Center and check in at the registration area.   Wear comfortable clothing and clothing appropriate for easy access to any Portacath or PICC line.   We strive to give you quality time with your provider. You may need to reschedule your appointment if you arrive late (15 or more minutes).  Arriving late affects you and other patients whose appointments are after yours.  Also, if you miss three or more appointments without notifying the office, you may be dismissed from the clinic at the provider's discretion.      For prescription refill requests, have your pharmacy contact our office and allow 72 hours for refills to be completed.    Today you received the following chemotherapy and/or immunotherapy agent: ADO-Trastuzumab    To help prevent nausea and vomiting after your treatment, we encourage you to take your nausea medication as directed.  BELOW ARE SYMPTOMS THAT SHOULD BE REPORTED IMMEDIATELY: *FEVER GREATER THAN 100.4 F (38 C) OR HIGHER *CHILLS OR SWEATING *NAUSEA AND VOMITING THAT IS NOT CONTROLLED WITH YOUR NAUSEA MEDICATION *UNUSUAL SHORTNESS OF BREATH *UNUSUAL BRUISING OR BLEEDING *URINARY PROBLEMS (pain or burning when urinating, or frequent urination) *BOWEL PROBLEMS (unusual diarrhea, constipation, pain near the anus) TENDERNESS IN MOUTH AND THROAT WITH OR WITHOUT PRESENCE OF ULCERS (sore throat, sores in mouth, or a toothache) UNUSUAL RASH, SWELLING OR PAIN  UNUSUAL VAGINAL DISCHARGE OR ITCHING   Items with * indicate a potential emergency and should be followed up as soon as possible or go to the Emergency Department if any problems should occur.  Please show the CHEMOTHERAPY ALERT CARD or IMMUNOTHERAPY ALERT CARD at  check-in to the Emergency Department and triage nurse.  Should you have questions after your visit or need to cancel or reschedule your appointment, please contact [place] [facility]  Dept: [phone]  and follow the prompts.  Office hours are 8:00 a.m. to 4:30 p.m. Monday - Friday. Please note that voicemails left after 4:00 p.m. may not be returned until the following business day.  We are closed weekends and major holidays. You have access to a nurse at all times for urgent questions. Please call the main number to the clinic Dept: [phone] and follow the prompts.   For any non-urgent questions, you may also contact your provider using MyChart. We now offer e-Visits for anyone 60 and older to request care online for non-urgent symptoms. For details visit [email or web address].   Also download the MyChart app! Go to the app store, search "MyChart", open the app, select [facility], and log in with your MyChart username and password.  Ado-Trastuzumab Emtansine Injection What is this medication? ADO-TRASTUZUMAB EMTANSINE (ADD oh traz TOO zuh mab em TAN zine) treats breast cancer. It works by blocking a protein that causes cancer cells to grow and multiply. This helps to slow or stop the spread of cancer cells. This medicine may be used for other purposes; ask your health care provider or pharmacist if you have questions. COMMON BRAND NAME(S): Kadcyla What should I tell my care team before I take this medication? They need to know if you have any of these conditions: Heart failure Liver disease Low platelet levels Lung disease Tingling of the fingers or toes or  other nerve disorder An unusual or allergic reaction to ado-trastuzumab emtansine, other medications, foods, dyes, or preservatives Pregnant or trying to get pregnant Breast-feeding How should I use this medication? This medication is infused into a vein. It is given by your care team in a hospital  or clinic setting. Talk to your care team about the use of this medication in children. Special care may be needed. Overdosage: If you think you have taken too much of this medicine contact a poison control center or emergency room at once. NOTE: This medicine is only for you. Do not share this medicine with others. What if I miss a dose? Keep appointments for follow-up doses. It is important not to miss your dose. Call your care team if you are unable to keep an appointment. What may interact with this medication? Atazanavir Boceprevir Clarithromycin Dalfopristin; quinupristin Delavirdine Indinavir Isoniazid, INH Itraconazole Ketoconazole Nefazodone Nelfinavir Ritonavir Telaprevir Telithromycin Tipranavir Voriconazole This list may not describe all possible interactions. Give your health care provider a list of all the medicines, herbs, non-prescription drugs, or dietary supplements you use. Also tell them if you smoke, drink alcohol, or use illegal drugs. Some items may interact with your medicine. What should I watch for while using this medication? This medication may make you feel generally unwell. This is not uncommon, as chemotherapy can affect healthy cells as well as cancer cells. Report any side effects. Continue your course of treatment even though you feel ill unless your care team tells you to stop. You may need blood work while taking this medication. This medication may increase your risk to bruise or bleed. Call your care team if you notice any unusual bleeding. Be careful brushing or flossing your teeth or using a toothpick because you may get an infection or bleed more easily. If you have any dental work done, tell your dentist you are receiving this medication. Talk to your care team if you may be pregnant. Serious birth defects can occur if you take this medication during pregnancy and for 7 months after the last dose. You will need a negative pregnancy test before  starting this medication. Contraception is recommended while taking this medication and for 7 months after the last dose. Your care team can help you find the option that works for you. If your partner can get pregnant, use a condom during sex while taking this medication and for 4 months after the last dose. Do not breastfeed while taking this medication and for 7 months after the last dose. This medication may cause infertility. Talk to your care team if you are concerned with your fertility. What side effects may I notice from receiving this medication? Side effects that you should report to your care team as soon as possible: Allergic reactions--skin rash, itching, hives, swelling of the face, lips, tongue, or throat Bleeding--bloody or black, tar-like stools, vomiting blood or brown material that looks like coffee grounds, red or dark brown urine, small red or purple spots on skin, unusual bruising or bleeding Dry cough, shortness of breath or trouble breathing Heart failure--shortness of breath, swelling of the ankles, feet, or hands, sudden weight gain, unusual weakness or fatigue Infusion reactions--chest pain, shortness of breath or trouble breathing, feeling faint or lightheaded Liver injury--right upper belly pain, loss of appetite, nausea, light-colored stool, dark yellow or brown urine, yellowing skin or eyes, unusual weakness or fatigue Pain, tingling, or numbness in the hands or feet Painful swelling, warmth, or redness of the skin, blisters or  sores at the infusion site Side effects that usually do not require medical attention (report to your care team if they continue or are bothersome): Constipation Fatigue Headache Muscle pain Nausea This list may not describe all possible side effects. Call your doctor for medical advice about side effects. You may report side effects to FDA at 1-800-FDA-1088. Where should I keep my medication? This medication is given in a hospital or  clinic. It will not be stored at home. NOTE: This sheet is a summary. It may not cover all possible information. If you have questions about this medicine, talk to your doctor, pharmacist, or health care provider.  2024 Elsevier/Gold Standard (2021-08-16 00:00:00)

## 2022-11-13 NOTE — Progress Notes (Signed)
Pt. declines to stay for 30 minute post Kadcyla observation. States she tolerates treatment well. Vital signs stable, left via ambulation and no shortness of breath noted.

## 2022-11-16 NOTE — Progress Notes (Signed)
  Subjective:  Patient ID: Tami Schmidt, female    DOB: 20-May-1932,  MRN: [id number]  Tami Schmidt presents to clinic today for at risk foot care with history of diabetic neuropathy and corn(s) both feet, callus(es) right foot and painful mycotic nails.  Pain interferes with ambulation. Aggravating factors include wearing enclosed shoe gear. Painful toenails interfere with ambulation. Aggravating factors include wearing enclosed shoe gear. Pain is relieved with periodic professional debridement. Painful corns and calluses are aggravated when weightbearing with and without shoegear. Pain is relieved with periodic professional debridement.   New problem(s): None.   PCP is Patient, No Pcp Per.  Allergies  Allergen Reactions   Bactrim Swelling    SWELLING OF MOUTH/FACE.   Lisinopril Swelling    SWELLING OF MOUTH/FACE.   Vasotec Swelling    SWELLING OF MOUTH/FACE.   Sulfamethoxazole-Trimethoprim     Other reaction(s): Unknown    Review of Systems: Negative except as noted in the HPI.  Objective: No changes noted in today's physical examination. There were no vitals filed for this visit. Tami Schmidt is a pleasant 89 y.o. female in NAD. AAO x 3.  Vascular Examination: CFT <3 seconds b/l. DP pulses faintly palpable b/l. PT pulses nonpalpable b/l. Digital hair absent. Skin temperature gradient warm to warm b/l. No pain with calf compression. No ischemia or gangrene. No cyanosis or clubbing noted b/l.    Neurological Examination: Sensation grossly intact b/l with 10 gram monofilament. Vibratory sensation intact b/l.   Dermatological Examination: Pedal skin warm and supple b/l. No open wounds b/l. No interdigital macerations. Toenails 1-5 b/l thick, discolored, elongated with subungual debris and pain on dorsal palpation.  Hyperkeratotic lesion(s) L 5th toe, submet head 5 right foot, and sub 5th met base b/l lower extremities.  No erythema, no edema, no drainage, no  fluctuance.  Musculoskeletal Examination: Muscle strength 5/5 to all lower extremity muscle groups bilaterally. HAV with bunion bilaterally and hammertoes 2-5 b/l. Pes planus deformity noted bilateral LE.  Radiographs: None  Last HgA1c:      Latest Ref Rng & Units 10/15/2022    1:34 PM 05/01/2022   11:49 AM 02/06/2022    1:22 PM  Hemoglobin A1C  Hemoglobin-A1c 4.8 - 5.6 % 5.5  6.6  6.5    Assessment/Plan: 1. Pain due to onychomycosis of toenails of both feet   2. Corns and callosities   3. Diabetic peripheral neuropathy associated with type 2 diabetes mellitus (HCC)    -Consent given for treatment as described below: -Examined patient. -Continue foot and shoe inspections daily. Monitor blood glucose per PCP/Endocrinologist's recommendations. -Toenails 1-5 b/l were debrided in length and girth with sterile nail nippers and dremel without iatrogenic bleeding.  -Corn(s) L 5th toe pared utilizing sterile scalpel blade without complication or incident. Total number debrided=1. -Callus(es) submet head 5 right foot and sub 5th met base b/l lower extremities pared utilizing sterile scalpel blade without complication or incident. Total number debrided =3. -Patient/POA to call should there be question/concern in the interim.   Return in about 3 months (around 02/12/2023).  Freddie Breech, DPM

## 2022-11-17 ENCOUNTER — Ambulatory Visit (HOSPITAL_COMMUNITY): Payer: Medicare Other

## 2022-11-27 ENCOUNTER — Ambulatory Visit: Payer: Medicare Other | Admitting: Adult Health

## 2022-11-27 ENCOUNTER — Ambulatory Visit: Payer: Medicare Other

## 2022-11-27 ENCOUNTER — Other Ambulatory Visit: Payer: Medicare Other

## 2022-12-05 ENCOUNTER — Encounter: Payer: Self-pay | Admitting: Adult Health

## 2022-12-05 ENCOUNTER — Inpatient Hospital Stay: Payer: Medicare Other

## 2022-12-05 ENCOUNTER — Inpatient Hospital Stay: Payer: Medicare Other | Admitting: Adult Health

## 2022-12-05 VITALS — BP 136/58 | HR 74 | Temp 97.9°F | Resp 18 | Ht 67.0 in | Wt 178.9 lb

## 2022-12-05 DIAGNOSIS — Z170 Estrogen receptor positive status [ER+]: Secondary | ICD-10-CM | POA: Diagnosis not present

## 2022-12-05 DIAGNOSIS — Z5112 Encounter for antineoplastic immunotherapy: Secondary | ICD-10-CM | POA: Diagnosis not present

## 2022-12-05 DIAGNOSIS — C50919 Malignant neoplasm of unspecified site of unspecified female breast: Secondary | ICD-10-CM | POA: Diagnosis not present

## 2022-12-05 DIAGNOSIS — C7989 Secondary malignant neoplasm of other specified sites: Secondary | ICD-10-CM | POA: Diagnosis not present

## 2022-12-05 DIAGNOSIS — Z95828 Presence of other vascular implants and grafts: Secondary | ICD-10-CM

## 2022-12-05 DIAGNOSIS — C50312 Malignant neoplasm of lower-inner quadrant of left female breast: Secondary | ICD-10-CM | POA: Diagnosis not present

## 2022-12-05 LAB — CBC WITH DIFFERENTIAL (CANCER CENTER ONLY)
Abs Immature Granulocytes: 0.01 10*3/uL (ref 0.00–0.07)
Basophils Absolute: 0.1 10*3/uL (ref 0.0–0.1)
Basophils Relative: 1 %
Eosinophils Absolute: 0.2 10*3/uL (ref 0.0–0.5)
Eosinophils Relative: 3 %
HCT: 32.1 % — ABNORMAL LOW (ref 36.0–46.0)
Hemoglobin: 10.4 g/dL — ABNORMAL LOW (ref 12.0–15.0)
Immature Granulocytes: 0 %
Lymphocytes Relative: 29 %
Lymphs Abs: 1.9 10*3/uL (ref 0.7–4.0)
MCH: 25.3 pg — ABNORMAL LOW (ref 26.0–34.0)
MCHC: 32.4 g/dL (ref 30.0–36.0)
MCV: 78.1 fL — ABNORMAL LOW (ref 80.0–100.0)
Monocytes Absolute: 0.4 10*3/uL (ref 0.1–1.0)
Monocytes Relative: 6 %
Neutro Abs: 4 10*3/uL (ref 1.7–7.7)
Neutrophils Relative %: 61 %
Platelet Count: 179 10*3/uL (ref 150–400)
RBC: 4.11 MIL/uL (ref 3.87–5.11)
RDW: 16.9 % — ABNORMAL HIGH (ref 11.5–15.5)
WBC Count: 6.6 10*3/uL (ref 4.0–10.5)
nRBC: 0 % (ref 0.0–0.2)

## 2022-12-05 LAB — CMP (CANCER CENTER ONLY)
ALT: 15 U/L (ref 0–44)
AST: 25 U/L (ref 15–41)
Albumin: 3.9 g/dL (ref 3.5–5.0)
Alkaline Phosphatase: 61 U/L (ref 38–126)
Anion gap: 6 (ref 5–15)
BUN: 19 mg/dL (ref 8–23)
CO2: 30 mmol/L (ref 22–32)
Calcium: 9.6 mg/dL (ref 8.9–10.3)
Chloride: 103 mmol/L (ref 98–111)
Creatinine: 0.73 mg/dL (ref 0.44–1.00)
GFR, Estimated: >60 mL/min (ref >60)
Glucose, Bld: 120 mg/dL — ABNORMAL HIGH (ref 70–99)
Potassium: 3.8 mmol/L (ref 3.5–5.1)
Sodium: 139 mmol/L (ref 135–145)
Total Bilirubin: 0.4 mg/dL (ref 0.3–1.2)
Total Protein: 7.2 g/dL (ref 6.5–8.1)

## 2022-12-05 MED ORDER — DIPHENHYDRAMINE HCL 25 MG PO CAPS
50.0000 mg | ORAL_CAPSULE | Freq: Once | ORAL | Status: AC
  Administered 2022-12-05: 50 mg via ORAL
  Filled 2022-12-05: qty 2

## 2022-12-05 MED ORDER — ACETAMINOPHEN 325 MG PO TABS
650.0000 mg | ORAL_TABLET | Freq: Once | ORAL | Status: AC
  Administered 2022-12-05: 650 mg via ORAL
  Filled 2022-12-05: qty 2

## 2022-12-05 MED ORDER — SODIUM CHLORIDE 0.9% FLUSH
10.0000 mL | Freq: Once | INTRAVENOUS | Status: AC
  Administered 2022-12-05: 10 mL

## 2022-12-05 MED ORDER — SODIUM CHLORIDE 0.9% FLUSH
10.0000 mL | INTRAVENOUS | Status: DC | PRN
  Administered 2022-12-05: 10 mL

## 2022-12-05 MED ORDER — SODIUM CHLORIDE 0.9 % IV SOLN
3.6000 mg/kg | Freq: Once | INTRAVENOUS | Status: AC
  Administered 2022-12-05: 320 mg via INTRAVENOUS
  Filled 2022-12-05: qty 16

## 2022-12-05 MED ORDER — HEPARIN SOD (PORK) LOCK FLUSH 100 UNIT/ML IV SOLN
500.0000 [IU] | Freq: Once | INTRAVENOUS | Status: AC | PRN
  Administered 2022-12-05: 500 [IU]

## 2022-12-05 MED ORDER — SODIUM CHLORIDE 0.9 % IV SOLN: Freq: Once | INTRAVENOUS | Status: AC

## 2022-12-05 MED ORDER — PROCHLORPERAZINE MALEATE 10 MG PO TABS
10.0000 mg | ORAL_TABLET | Freq: Once | ORAL | Status: AC
  Administered 2022-12-05: 10 mg via ORAL
  Filled 2022-12-05: qty 1

## 2022-12-05 NOTE — Patient Instructions (Signed)
[facility]  Discharge Instructions: Thank you for choosing [facility] to provide your oncology and hematology care.   If you have a lab appointment with the Cancer Center, please go directly to the Cancer Center and check in at the registration area.   Wear comfortable clothing and clothing appropriate for easy access to any Portacath or PICC line.   We strive to give you quality time with your provider. You may need to reschedule your appointment if you arrive late (15 or more minutes).  Arriving late affects you and other patients whose appointments are after yours.  Also, if you miss three or more appointments without notifying the office, you may be dismissed from the clinic at the provider's discretion.      For prescription refill requests, have your pharmacy contact our office and allow 72 hours for refills to be completed.    Today you received the following chemotherapy and/or immunotherapy agents: Kadcyla.       To help prevent nausea and vomiting after your treatment, we encourage you to take your nausea medication as directed.  BELOW ARE SYMPTOMS THAT SHOULD BE REPORTED IMMEDIATELY: *FEVER GREATER THAN 100.4 F (38 C) OR HIGHER *CHILLS OR SWEATING *NAUSEA AND VOMITING THAT IS NOT CONTROLLED WITH YOUR NAUSEA MEDICATION *UNUSUAL SHORTNESS OF BREATH *UNUSUAL BRUISING OR BLEEDING *URINARY PROBLEMS (pain or burning when urinating, or frequent urination) *BOWEL PROBLEMS (unusual diarrhea, constipation, pain near the anus) TENDERNESS IN MOUTH AND THROAT WITH OR WITHOUT PRESENCE OF ULCERS (sore throat, sores in mouth, or a toothache) UNUSUAL RASH, SWELLING OR PAIN  UNUSUAL VAGINAL DISCHARGE OR ITCHING   Items with * indicate a potential emergency and should be followed up as soon as possible or go to the Emergency Department if any problems should occur.  Please show the CHEMOTHERAPY ALERT CARD or IMMUNOTHERAPY ALERT CARD at  check-in to the Emergency Department and triage nurse.  Should you have questions after your visit or need to cancel or reschedule your appointment, please contact [facility]  Dept: [phone]  and follow the prompts.  Office hours are 8:00 a.m. to 4:30 p.m. Monday - Friday. Please note that voicemails left after 4:00 p.m. may not be returned until the following business day.  We are closed weekends and major holidays. You have access to a nurse at all times for urgent questions. Please call the main number to the clinic Dept: [phone] and follow the prompts.   For any non-urgent questions, you may also contact your provider using MyChart. We now offer e-Visits for anyone 18 and older to request care online for non-urgent symptoms. For details visit [email or web address].   Also download the MyChart app! Go to the app store, search "MyChart", open the app, select [facility], and log in with your MyChart username and password.   

## 2022-12-05 NOTE — Assessment & Plan Note (Signed)
Tami Schmidt is an 89 year old woman with recurrent breast cancer that was first noted in November 2022.  It was biopsy ER positive PR positive and HER2 positive.    Current Treatment:   Kadcyla  She is tolerating this well with no side effects.  Most recent echocardiogram occurred July 29.  Next due in October.  Repeat CT scans due September 5.  I asked my nurse to call to help get this scheduled.  RTC in 3 weeks for labs, f/u, and her next treatment.

## 2022-12-05 NOTE — Progress Notes (Signed)
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) Staging form: Breast, AJCC 7th Edition - Clinical: Stage Unknown (TX, N1, M0) - Signed by Lowella Dell, MD on 07/27/2014  Malignant neoplasm of lower-inner quadrant of left breast in female, estrogen receptor positive (HCC) Staging form: Breast, AJCC 7th Edition - Clinical: Stage IA (T1b, N0, M0) - Signed by Lowella Dell, MD on 07/27/2014   SUMMARY OF ONCOLOGIC HISTORY: Tami Schmidt   (1) status post right lumpectomy and axillary dissection in 1993 in [place] for what appears to have been a stage I breast cancer, treated with radiation and then tamoxifen for 5 years   (2) status post right mastectomy 2002 for a recurrence in the right breast, treated adjuvantly with tamoxifen for an additional 5 years   (3) status post left mastectomy and sentinel lymph node sampling 03/07/2003 for a lower inner quadrant pT1b, pN0, stage IA mucinous breast cancer, grade 2; no radiation therapy was given. Tamoxifen was continued until 2009   RECURRENT DISEASE/ LEFT: OCTOBER 2014 (4) excisional biopsy of a left chest wall mass 01/26/2013 showing carcinoma consistent with invasive ductal carcinoma, estrogen receptor 100% positive, progesterone receptor 0% positive, with an MIB-1 of 22% and no HER-2 amplification.    (5)  PET scan on 03/03/2013 confirmed extensive metastatic adenopathy in the left axilla, left subpectoral musculature, and left supraclavicular region   (6)  started on anastrozole daily beginning 03/01/2013, interrupted during radiation therapy, resumed March 2015, discontinued March 2016 because of fatigue and arthralgias   (7) radiation therapy 03/31/2013 through 05/23/2013 Site/dose:   The patient was treated initially with a forward treatment planning technique to the left chest wall in addition to treatment to  the supraclavicular region. This consisted of a 3-D conformal technique. The patient was treated in this fashion to a dose of 50.4 gray. The patient then received a 14 gray boost treatment using an electron field. The total dose was 64.4 gray.   (8) Osteopenia, zometa yearly started 07/27/2014, but poorly tolerated.              (a) bone density scan on 12/27/13 showed a t-score of -1.8              (b) started Denosumab/Prolia April 2017, canceled after one dose per patient   (9) tamoxifen started 07/27/2014, stopped November 2019 (had total 5 years of antiestrogens)   (10) likely thalassemia trait   (11) left subpectoral soft tissue nodule noted 07/17/2015 unchanged through serial scans, most recent 02/09/2018, with no metabolic activity noted in PET scan of 2016 and interval calcification.  This is presumed benign   (12)  ADDITIONAL REGIONAL RECURRENCE:             (A) CT of the chest with contrast 03/05/2021 obtained to evaluate left upper extremity lymphedema as showed acute pulmonary emboli.  There was also left axillary and subpectoral lymphadenopathy             (B) rivaroxaban started 03/05/2021             (C) left breast ultrasound 03/29/2021 shows 2 areas in the infraclavicular tissue consistent with fat necrosis.  There were however multiple retropectoral interpectoral and left axillary masses.             (D) left axillary lymph node biopsy 04/04/2021: IDC grade 3, ER  positive, 100%, strong staining intensity, PR positive, 20%, weak staining intensity, Ki-67 of 15%, HER2 positive by IHC, ratio 2.90             (E) Fulvestrant every 4 weeks beginning 04/18/2021             (F) Herceptin every 3 weeks beginning 05/30/2021             Mild progression noted on 12/12 CT chest/abd/pelvis             (G) Kadcyla every 3 weeks beginning 05/01/2022  CURRENT THERAPY: Kadcyla  INTERVAL HISTORY: Tami Schmidt 89 y.o. female returns for follow-up on treatment with Kadcyla.  She is doing  moderately well today.  She has no significant issues today.  She says that her scans have not yet been scheduled and she was under the impression they would be done before her September 12 visit with Dr. Al Pimple.  Her most recent echocardiogram occurred 11/10/2022 and demonstrated a LVEF of 60-65%.     Patient Active Problem List   Diagnosis Date Noted   Acute GI bleeding 10/04/2022   Port-A-Cath in place 06/12/2022   Loss of perception for taste 03/17/2022   Primary osteoarthritis involving multiple joints 03/17/2022   Genetic testing 07/24/2021   Microcytosis 04/29/2021   Hypokalemia 04/28/2021   Pulmonary embolism (HCC)    Agnosia 08/09/2020   Hardening of the aorta (main artery of the heart) (HCC) 08/09/2020   Morbid obesity (HCC) 08/09/2020   Neuropathy 08/09/2020   Primary osteoarthritis 08/09/2020   Sensorineural hearing loss (SNHL) of both ears 08/07/2020   Anosmia 06/05/2020   Tinnitus, bilateral 06/05/2020   GI bleed 07/13/2019   Acute blood loss anemia 07/12/2019   Microcytic anemia 01/05/2018   Melena 01/04/2018   Lower GI bleed 01/04/2018   Diabetes mellitus type II, controlled, with no complications (HCC) 12/05/2017   Tenosynovitis of left wrist 12/02/2017   Infection of left wrist (HCC) 11/30/2017   Primary osteoarthritis of right hip 01/05/2017   Osteoarthritis of right hip 01/03/2017   Lymphedema of upper extremity 01/17/2014   Osteopenia 01/17/2014   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   Dilated pore of [place] 08/02/2013   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   Diabetes mellitus    fasting 90-100   HTN (hypertension) 03/25/2013   Hx of radiation therapy    breasts hx   Hypercholesterolemia    Hypertension    Lymphedema of arm    left arm   Pulmonary embolism (HCC)    Type II or unspecified type diabetes mellitus without mention of complication, not stated as uncontrolled 03/25/2013    SURGICAL HISTORY: Past Surgical History:  Procedure Laterality Date   ABDOMINAL HYSTERECTOMY     ANKLE SURGERY Right 1995   APPENDECTOMY  BIOPSY  01/07/2018   Procedure: BIOPSY;  Surgeon: Kerin Salen, MD;  Location: [facility];  Service: Gastroenterology;;   BREAST SURGERY Bilateral    mastectomy   COLONOSCOPY WITH PROPOFOL N/A 01/07/2018   Procedure: COLONOSCOPY WITH PROPOFOL;  Surgeon: Kerin Salen, MD;  Location: [facility];  Service: Gastroenterology;  Laterality: N/A;   ESOPHAGOGASTRODUODENOSCOPY (EGD) WITH PROPOFOL N/A 01/06/2018   Procedure: ESOPHAGOGASTRODUODENOSCOPY (EGD) WITH PROPOFOL;  Surgeon: Kerin Salen, MD;  Location: [facility];  Service: Gastroenterology;  Laterality: N/A;   HERNIA REPAIR  04-26-2010   IR IMAGING GUIDED PORT INSERTION  10/29/2021   MASS EXCISION Left 01/26/2013   Procedure: EXCISION LEFT CHEST WALL MASS AND LEFT ABDOMNAL WALL MASS;  Surgeon: [name], MD;  Location: [facility];  Service: General;  Laterality: Left;   MASS EXCISION Left 09/20/2014   Procedure: EXCISION OF LEFT CHEST WALL MASS;  Surgeon: [name], MD;  Location: [facility];  Service: General;  Laterality: Left;   TEE WITHOUT CARDIOVERSION N/A 12/08/2017   Procedure: TRANSESOPHAGEAL ECHOCARDIOGRAM (TEE);  Surgeon:  Lewayne Bunting, MD;  Location: [facility];  Service: Cardiovascular;  Laterality: N/A;   TOTAL HIP ARTHROPLASTY Right 01/05/2017   TOTAL HIP ARTHROPLASTY Right 01/05/2017   Procedure: TOTAL HIP ARTHROPLASTY ANTERIOR APPROACH;  Surgeon: Gean Birchwood, MD;  Location: [facility];  Service: Orthopedics;  Laterality: Right;    SOCIAL HISTORY: Social History   Socioeconomic History   Marital status: Married    Spouse name: Not on file   Number of children: Not on file   Years of education: Not on file   Highest education level: Not on file  Occupational History   Occupation: retired Child psychotherapist  Tobacco Use   Smoking status: Former    Current packs/day: 0.00    Types: Cigarettes    Quit date: 04/14/1972    Years since quitting: 50.6   Smokeless tobacco: Never  Vaping Use   Vaping status: Never Used  Substance and Sexual Activity   Alcohol use: Yes    Comment: occasional   Drug use: Not Currently   Sexual activity: Yes    Birth control/protection: Surgical  Other Topics Concern   Not on file  Social History Narrative   Not on file   Social Determinants of Health   Financial Resource Strain: Not on file  Food Insecurity: No Food Insecurity (10/05/2022)   Hunger Vital Sign    Worried About Running Out of Food in the Last Year: Never true    Ran Out of Food in the Last Year: Never true  Transportation Needs: No Transportation Needs (10/05/2022)   PRAPARE - Administrator, Civil Service (Medical): No    Lack of Transportation (Non-Medical): No  Physical Activity: Not on file  Stress: Not on file  Social Connections: Not on file  Intimate Partner Violence: Not At Risk (10/05/2022)   Humiliation, Afraid, Rape, and Kick questionnaire    Fear of Current or Ex-Partner: No    Emotionally Abused: No    Physically Abused: No    Sexually Abused: No    FAMILY HISTORY: Family History  Problem Relation Age of Onset   Breast cancer Cousin        maternal first cousin    Uterine cancer Cousin        maternal first cousin    Review of Systems  Constitutional:  Negative for appetite change, chills, fatigue, fever and unexpected weight change.  HENT:   Negative for  hearing loss, lump/mass and trouble swallowing.   Eyes:  Negative for eye problems and icterus.  Respiratory:  Negative for chest tightness, cough and shortness of breath.   Cardiovascular:  Negative for chest pain, leg swelling and palpitations.  Gastrointestinal:  Negative for abdominal distention, abdominal pain, constipation, diarrhea, nausea and vomiting.  Endocrine: Negative for hot flashes.  Genitourinary:  Negative for difficulty urinating.   Musculoskeletal:  Negative for arthralgias.  Skin:  Negative for itching and rash.  Neurological:  Negative for dizziness, extremity weakness, headaches and numbness.  Hematological:  Negative for adenopathy. Does not bruise/bleed easily.  Psychiatric/Behavioral:  Negative for depression. The patient is not nervous/anxious.       PHYSICAL EXAMINATION   Onc Performance Status - 12/05/22 1400       ECOG Perf Status   ECOG Perf Status Ambulatory and capable of all selfcare but unable to carry out any work activities.  Up and about more than 50% of waking hours      KPS SCALE   KPS % SCORE Normal activity with effort, some s/s of disease             Vitals:   12/05/22 1438  BP: (!) 136/58  Pulse: 74  Resp: 18  Temp: 97.9 F (36.6 C)  SpO2: 100%    Physical Exam Constitutional:      General: She is not in acute distress.    Appearance: Normal appearance. She is not toxic-appearing.  HENT:     Head: Normocephalic and atraumatic.     Mouth/Throat:     Mouth: Mucous membranes are moist.     Pharynx: Oropharynx is clear. No oropharyngeal exudate or posterior oropharyngeal erythema.  Eyes:     General: No scleral icterus. Cardiovascular:     Rate and Rhythm: Normal rate and regular rhythm.     Pulses: Normal pulses.     Heart  sounds: Normal heart sounds.  Pulmonary:     Effort: Pulmonary effort is normal.     Breath sounds: Normal breath sounds.  Abdominal:     General: Abdomen is flat. Bowel sounds are normal. There is no distension.     Palpations: Abdomen is soft.     Tenderness: There is no abdominal tenderness.  Musculoskeletal:        General: No swelling.     Cervical back: Neck supple.  Lymphadenopathy:     Cervical: No cervical adenopathy.  Skin:    General: Skin is warm and dry.     Findings: No rash.  Neurological:     General: No focal deficit present.     Mental Status: She is alert.  Psychiatric:        Mood and Affect: Mood normal.        Behavior: Behavior normal.     LABORATORY DATA:  CBC    Component Value Date/Time   WBC 6.6 12/05/2022 1407   WBC 7.8 10/07/2022 0105   RBC 4.11 12/05/2022 1407   HGB 10.4 (L) 12/05/2022 1407   HGB 10.5 (L) 01/20/2017 1057   HCT 32.1 (L) 12/05/2022 1407   HCT 25.8 (L) 01/05/2018 0513   HCT 32.8 (L) 01/20/2017 1057   PLT 179 12/05/2022 1407   PLT 368 01/20/2017 1057   MCV 78.1 (L) 12/05/2022 1407   MCV 77.8 (L) 01/20/2017 1057   MCH 25.3 (L) 12/05/2022 1407   MCHC 32.4 12/05/2022 1407   RDW 16.9 (H) 12/05/2022 1407   RDW 16.0 (H) 01/20/2017 1057  LYMPHSABS 1.9 12/05/2022 1407   LYMPHSABS 1.3 01/20/2017 1057   MONOABS 0.4 12/05/2022 1407   MONOABS 0.4 01/20/2017 1057   EOSABS 0.2 12/05/2022 1407   EOSABS 0.1 01/20/2017 1057   BASOSABS 0.1 12/05/2022 1407   BASOSABS 0.1 01/20/2017 1057    CMP     Component Value Date/Time   NA 139 12/05/2022 1407   NA 139 01/20/2017 1057   K 3.8 12/05/2022 1407   K 3.9 01/20/2017 1057   CL 103 12/05/2022 1407   CO2 30 12/05/2022 1407   CO2 25 01/20/2017 1057   GLUCOSE 120 (H) 12/05/2022 1407   GLUCOSE 135 01/20/2017 1057   BUN 19 12/05/2022 1407   BUN 15.6 01/20/2017 1057   CREATININE 0.73 12/05/2022 1407   CREATININE 0.7 01/20/2017 1057   CALCIUM 9.6 12/05/2022 1407   CALCIUM 9.3  01/20/2017 1057   PROT 7.2 12/05/2022 1407   PROT 7.1 01/20/2017 1057   ALBUMIN 3.9 12/05/2022 1407   ALBUMIN 3.4 (L) 01/20/2017 1057   AST 25 12/05/2022 1407   AST 10 01/20/2017 1057   ALT 15 12/05/2022 1407   ALT 14 01/20/2017 1057   ALKPHOS 61 12/05/2022 1407   ALKPHOS 69 01/20/2017 1057   BILITOT 0.4 12/05/2022 1407   BILITOT 0.43 01/20/2017 1057   GFRNONAA >60 12/05/2022 1407   GFRAA >60 07/14/2019 0538      ASSESSMENT and THERAPY PLAN:   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  It was biopsy ER positive PR positive and HER2 positive.    Current Treatment:   Kadcyla  She is tolerating this well with no side effects.  Most recent echocardiogram occurred July 29.  Next due in October.  Repeat CT scans due September 5.  I asked my nurse to call to help get this scheduled.  RTC in 3 weeks for labs, f/u, and her next treatment.   All questions were answered. The patient knows to call the clinic with any problems, questions or concerns. We can certainly see the patient much sooner if necessary.  Total encounter time:20 minutes*in face-to-face visit time, chart review, lab review, care coordination, order entry, and documentation of the encounter time.    Lillard Anes, NP 12/05/22 3:10 PM Medical Oncology and Hematology [facility] [address] [place], [place] [address] Tel. [phone]    Fax. [phone]  *Total Encounter Time as defined by the Centers for Medicare and Medicaid Services includes, in addition to the face-to-face time of a patient visit (documented in the note above) non-face-to-face time: obtaining and reviewing outside history, ordering and reviewing medications, tests or procedures, care coordination (communications with other health care professionals or caregivers) and documentation in the medical record.

## 2022-12-18 ENCOUNTER — Ambulatory Visit (HOSPITAL_COMMUNITY)
Admission: RE | Admit: 2022-12-18 | Discharge: 2022-12-18 | Disposition: A | Discharge status: Home / Self Care | Payer: Medicare Other | Source: Ambulatory Visit | Attending: Hematology and Oncology | Admitting: Hematology and Oncology

## 2022-12-18 DIAGNOSIS — C50312 Malignant neoplasm of lower-inner quadrant of left female breast: Secondary | ICD-10-CM | POA: Insufficient documentation

## 2022-12-18 DIAGNOSIS — Z170 Estrogen receptor positive status [ER+]: Secondary | ICD-10-CM | POA: Diagnosis present

## 2022-12-18 MED ORDER — IOHEXOL 300 MG/ML  SOLN
100.0000 mL | Freq: Once | INTRAMUSCULAR | Status: AC | PRN
  Administered 2022-12-18: 100 mL via INTRAVENOUS

## 2022-12-18 MED ORDER — HEPARIN SOD (PORK) LOCK FLUSH 100 UNIT/ML IV SOLN
500.0000 [IU] | Freq: Once | INTRAVENOUS | Status: AC
  Administered 2022-12-18: 500 [IU] via INTRAVENOUS

## 2022-12-25 ENCOUNTER — Inpatient Hospital Stay: Payer: Medicare Other | Admitting: Hematology and Oncology

## 2022-12-25 ENCOUNTER — Inpatient Hospital Stay: Payer: Medicare Other

## 2022-12-25 ENCOUNTER — Inpatient Hospital Stay: Payer: Medicare Other | Attending: Adult Health

## 2022-12-25 VITALS — BP 153/71 | HR 73 | Temp 97.7°F | Resp 17 | Wt 179.4 lb

## 2022-12-25 DIAGNOSIS — Z170 Estrogen receptor positive status [ER+]: Secondary | ICD-10-CM | POA: Diagnosis not present

## 2022-12-25 DIAGNOSIS — C50312 Malignant neoplasm of lower-inner quadrant of left female breast: Secondary | ICD-10-CM

## 2022-12-25 DIAGNOSIS — Z5112 Encounter for antineoplastic immunotherapy: Secondary | ICD-10-CM | POA: Diagnosis present

## 2022-12-25 DIAGNOSIS — Z87891 Personal history of nicotine dependence: Secondary | ICD-10-CM | POA: Diagnosis not present

## 2022-12-25 DIAGNOSIS — Z9013 Acquired absence of bilateral breasts and nipples: Secondary | ICD-10-CM | POA: Diagnosis not present

## 2022-12-25 DIAGNOSIS — G629 Polyneuropathy, unspecified: Secondary | ICD-10-CM | POA: Diagnosis not present

## 2022-12-25 DIAGNOSIS — C773 Secondary and unspecified malignant neoplasm of axilla and upper limb lymph nodes: Secondary | ICD-10-CM | POA: Insufficient documentation

## 2022-12-25 DIAGNOSIS — Z86711 Personal history of pulmonary embolism: Secondary | ICD-10-CM | POA: Insufficient documentation

## 2022-12-25 DIAGNOSIS — M8580 Other specified disorders of bone density and structure, unspecified site: Secondary | ICD-10-CM | POA: Insufficient documentation

## 2022-12-25 DIAGNOSIS — C50919 Malignant neoplasm of unspecified site of unspecified female breast: Secondary | ICD-10-CM

## 2022-12-25 DIAGNOSIS — Z95828 Presence of other vascular implants and grafts: Secondary | ICD-10-CM

## 2022-12-25 LAB — CBC WITH DIFFERENTIAL (CANCER CENTER ONLY)
Abs Immature Granulocytes: 0.02 10*3/uL (ref 0.00–0.07)
Basophils Absolute: 0 10*3/uL (ref 0.0–0.1)
Basophils Relative: 1 %
Eosinophils Absolute: 0.2 10*3/uL (ref 0.0–0.5)
Eosinophils Relative: 3 %
HCT: 34.5 % — ABNORMAL LOW (ref 36.0–46.0)
Hemoglobin: 11.1 g/dL — ABNORMAL LOW (ref 12.0–15.0)
Immature Granulocytes: 0 %
Lymphocytes Relative: 28 %
Lymphs Abs: 1.7 10*3/uL (ref 0.7–4.0)
MCH: 24.8 pg — ABNORMAL LOW (ref 26.0–34.0)
MCHC: 32.2 g/dL (ref 30.0–36.0)
MCV: 77.2 fL — ABNORMAL LOW (ref 80.0–100.0)
Monocytes Absolute: 0.4 10*3/uL (ref 0.1–1.0)
Monocytes Relative: 7 %
Neutro Abs: 3.7 10*3/uL (ref 1.7–7.7)
Neutrophils Relative %: 61 %
Platelet Count: 171 10*3/uL (ref 150–400)
RBC: 4.47 MIL/uL (ref 3.87–5.11)
RDW: 16.9 % — ABNORMAL HIGH (ref 11.5–15.5)
WBC Count: 6 10*3/uL (ref 4.0–10.5)
nRBC: 0 % (ref 0.0–0.2)

## 2022-12-25 LAB — CMP (CANCER CENTER ONLY)
ALT: 14 U/L (ref 0–44)
AST: 26 U/L (ref 15–41)
Albumin: 3.8 g/dL (ref 3.5–5.0)
Alkaline Phosphatase: 59 U/L (ref 38–126)
Anion gap: 7 (ref 5–15)
BUN: 17 mg/dL (ref 8–23)
CO2: 28 mmol/L (ref 22–32)
Calcium: 9.4 mg/dL (ref 8.9–10.3)
Chloride: 104 mmol/L (ref 98–111)
Creatinine: 0.81 mg/dL (ref 0.44–1.00)
GFR, Estimated: >60 mL/min (ref >60)
Glucose, Bld: 176 mg/dL — ABNORMAL HIGH (ref 70–99)
Potassium: 3.7 mmol/L (ref 3.5–5.1)
Sodium: 139 mmol/L (ref 135–145)
Total Bilirubin: 0.5 mg/dL (ref 0.3–1.2)
Total Protein: 7.2 g/dL (ref 6.5–8.1)

## 2022-12-25 MED ORDER — SODIUM CHLORIDE 0.9% FLUSH
10.0000 mL | INTRAVENOUS | Status: DC | PRN
  Administered 2022-12-25: 10 mL

## 2022-12-25 MED ORDER — DIPHENHYDRAMINE HCL 25 MG PO CAPS
50.0000 mg | ORAL_CAPSULE | Freq: Once | ORAL | Status: AC
  Administered 2022-12-25: 50 mg via ORAL
  Filled 2022-12-25: qty 2

## 2022-12-25 MED ORDER — SODIUM CHLORIDE 0.9 % IV SOLN
3.6000 mg/kg | Freq: Once | INTRAVENOUS | Status: AC
  Administered 2022-12-25: 320 mg via INTRAVENOUS
  Filled 2022-12-25: qty 8

## 2022-12-25 MED ORDER — PROCHLORPERAZINE MALEATE 10 MG PO TABS
10.0000 mg | ORAL_TABLET | Freq: Once | ORAL | Status: AC
  Administered 2022-12-25: 10 mg via ORAL
  Filled 2022-12-25: qty 1

## 2022-12-25 MED ORDER — SODIUM CHLORIDE 0.9% FLUSH
10.0000 mL | Freq: Once | INTRAVENOUS | Status: AC
  Administered 2022-12-25: 10 mL

## 2022-12-25 MED ORDER — ACETAMINOPHEN 325 MG PO TABS
650.0000 mg | ORAL_TABLET | Freq: Once | ORAL | Status: AC
  Administered 2022-12-25: 650 mg via ORAL
  Filled 2022-12-25: qty 2

## 2022-12-25 MED ORDER — SODIUM CHLORIDE 0.9 % IV SOLN: Freq: Once | INTRAVENOUS | Status: AC

## 2022-12-25 MED ORDER — HEPARIN SOD (PORK) LOCK FLUSH 100 UNIT/ML IV SOLN
500.0000 [IU] | Freq: Once | INTRAVENOUS | Status: AC | PRN
  Administered 2022-12-25: 500 [IU]

## 2022-12-25 NOTE — Progress Notes (Signed)
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) Staging form: Breast, AJCC 7th Edition - Clinical: Stage Unknown (TX, N1, M0) - Signed by Lowella Dell, MD on 07/27/2014  Malignant neoplasm of lower-inner quadrant of left breast in female, estrogen receptor positive (HCC) Staging form: Breast, AJCC 7th Edition - Clinical: Stage IA (T1b, N0, M0) - Signed by Lowella Dell, MD on 07/27/2014   SUMMARY OF ONCOLOGIC HISTORY: [place] woman   (1) status post right lumpectomy and axillary dissection in 1993 in [place] for what appears to have been a stage I breast cancer, treated with radiation and then tamoxifen for 5 years   (2) status post right mastectomy 2002 for a recurrence in the right breast, treated adjuvantly with tamoxifen for an additional 5 years   (3) status post left mastectomy and sentinel lymph node sampling 03/07/2003 for a lower inner quadrant pT1b, pN0, stage IA mucinous breast cancer, grade 2; no radiation therapy was given. Tamoxifen was continued until 2009   RECURRENT DISEASE/ LEFT: OCTOBER 2014 (4) excisional biopsy of a left chest wall mass 01/26/2013 showing carcinoma consistent with invasive ductal carcinoma, estrogen receptor 100% positive, progesterone receptor 0% positive, with an MIB-1 of 22% and no HER-2 amplification.    (5)  PET scan on 03/03/2013 confirmed extensive metastatic adenopathy in the left axilla, left subpectoral musculature, and left supraclavicular region   (6)  started on anastrozole daily beginning 03/01/2013, interrupted during radiation therapy, resumed March 2015, discontinued March 2016 because of fatigue and arthralgias   (7) radiation therapy 03/31/2013 through 05/23/2013 Site/dose:   The patient was treated initially with a forward treatment planning technique to the left chest wall in addition to treatment to  the supraclavicular region. This consisted of a 3-D conformal technique. The patient was treated in this fashion to a dose of 50.4 gray. The patient then received a 14 gray boost treatment using an electron field. The total dose was 64.4 gray.   (8) Osteopenia, zometa yearly started 07/27/2014, but poorly tolerated.              (a) bone density scan on 12/27/13 showed a t-score of -1.8              (b) started Denosumab/Prolia April 2017, canceled after one dose per patient   (9) tamoxifen started 07/27/2014, stopped November 2019 (had total 5 years of antiestrogens)   (10) likely thalassemia trait   (11) left subpectoral soft tissue nodule noted 07/17/2015 unchanged through serial scans, most recent 02/09/2018, with no metabolic activity noted in PET scan of 2016 and interval calcification.  This is presumed benign   (12)  ADDITIONAL REGIONAL RECURRENCE:             (A) CT of the chest with contrast 03/05/2021 obtained to evaluate left upper extremity lymphedema as showed acute pulmonary emboli.  There was also left axillary and subpectoral lymphadenopathy             (B) rivaroxaban started 03/05/2021             (C) left breast ultrasound 03/29/2021 shows 2 areas in the infraclavicular tissue consistent with fat necrosis.  There were however multiple retropectoral interpectoral and left axillary masses.             (D) left axillary lymph node biopsy 04/04/2021: IDC grade 3, ER  positive, 100%, strong staining intensity, PR positive, 20%, weak staining intensity, Ki-67 of 15%, HER2 positive by IHC, ratio 2.90             (E) Fulvestrant every 4 weeks beginning 04/18/2021             (F) Herceptin every 3 weeks beginning 05/30/2021             Mild progression noted on 12/12 CT chest/abd/pelvis             (G) Kadcyla every 3 weeks beginning 05/01/2022  CURRENT THERAPY: Kadcyla  INTERVAL HISTORY: Jewelya Dirusso [place] 89 y.o. female returns for follow-up on treatment with Kadcyla.  She has noticed  that left arm is less swollen. She has otherwise noticed fatigue, no other complaints. She has noticed some neuropathy in the 1.5 fingers in left hand, right hand feels fine. Her most recent echocardiogram occurred 11/10/2022 and demonstrated a LVEF of 60-65%.   She wonders if she can get COVID and flu shots. No change in breathing. Bowels are moving ok, baseline constipation No trouble urinating. Rest of the pertinent 10 point ROS reviewed and neg.  Patient Active Problem List   Diagnosis Date Noted   Acute GI bleeding 10/04/2022   Port-A-Cath in place 06/12/2022   Loss of perception for taste 03/17/2022   Primary osteoarthritis involving multiple joints 03/17/2022   Genetic testing 07/24/2021   Microcytosis 04/29/2021   Hypokalemia 04/28/2021   Pulmonary embolism (HCC)    Agnosia 08/09/2020   Hardening of the aorta (main artery of the heart) (HCC) 08/09/2020   Morbid obesity (HCC) 08/09/2020   Neuropathy 08/09/2020   Primary osteoarthritis 08/09/2020   Sensorineural hearing loss (SNHL) of both ears 08/07/2020   Anosmia 06/05/2020   Tinnitus, bilateral 06/05/2020   GI bleed 07/13/2019   Acute blood loss anemia 07/12/2019   Microcytic anemia 01/05/2018   Melena 01/04/2018   Lower GI bleed 01/04/2018   Diabetes mellitus type II, controlled, with no complications (HCC) 12/05/2017   Tenosynovitis of left wrist 12/02/2017   Infection of left wrist (HCC) 11/30/2017   Primary osteoarthritis of right hip 01/05/2017   Osteoarthritis of right hip 01/03/2017   Lymphedema of upper extremity 01/17/2014   Osteopenia 01/17/2014   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   Dilated pore of [place] 08/02/2013   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   Diabetes mellitus    fasting 90-100   HTN (hypertension) 03/25/2013   Hx of radiation therapy    breasts hx   Hypercholesterolemia    Hypertension    Lymphedema of arm    left arm   Pulmonary embolism (HCC)    Type II or unspecified type diabetes mellitus without mention of complication, not stated as uncontrolled 03/25/2013    SURGICAL HISTORY: Past Surgical History:  Procedure Laterality Date   ABDOMINAL HYSTERECTOMY     ANKLE SURGERY Right 1995   APPENDECTOMY     BIOPSY  01/07/2018   Procedure: BIOPSY;  Surgeon: Kerin Salen, MD;  Location: [facility];  Service: Gastroenterology;;   BREAST SURGERY Bilateral    mastectomy   COLONOSCOPY WITH PROPOFOL N/A 01/07/2018   Procedure: COLONOSCOPY WITH PROPOFOL;  Surgeon: Kerin Salen, MD;  Location: [facility];  Service: Gastroenterology;  Laterality: N/A;   ESOPHAGOGASTRODUODENOSCOPY (EGD) WITH PROPOFOL N/A 01/06/2018   Procedure: ESOPHAGOGASTRODUODENOSCOPY (EGD) WITH PROPOFOL;  Surgeon: Kerin Salen, MD;  Location: [facility];  Service: Gastroenterology;  Laterality: N/A;   HERNIA REPAIR  04-26-2010   IR IMAGING GUIDED PORT INSERTION  10/29/2021   MASS EXCISION Left 01/26/2013   Procedure: EXCISION LEFT CHEST WALL MASS AND LEFT ABDOMNAL WALL MASS;  Surgeon: [name], MD;  Location: [facility];  Service: General;  Laterality: Left;   MASS EXCISION Left 09/20/2014   Procedure: EXCISION OF LEFT CHEST WALL MASS;  Surgeon: [name], MD;  Location: [facility];  Service: General;  Laterality: Left;   TEE WITHOUT CARDIOVERSION N/A 12/08/2017   Procedure: TRANSESOPHAGEAL ECHOCARDIOGRAM (TEE);  Surgeon: Lewayne Bunting, MD;  Location: [facility];  Service: Cardiovascular;  Laterality: N/A;   TOTAL HIP ARTHROPLASTY Right 01/05/2017   TOTAL HIP ARTHROPLASTY Right 01/05/2017   Procedure: TOTAL HIP ARTHROPLASTY ANTERIOR APPROACH;  Surgeon: Gean Birchwood, MD;  Location: [facility];  Service: Orthopedics;  Laterality: Right;    SOCIAL HISTORY: Social History   Socioeconomic History   Marital status: Married    Spouse name: Not on file   Number of children: Not on file   Years of education: Not on file   Highest education level: Not on file  Occupational History   Occupation: retired Child psychotherapist  Tobacco Use   Smoking status: Former    Current packs/day: 0.00    Types: Cigarettes    Quit date: 04/14/1972    Years since quitting: 50.7   Smokeless tobacco: Never  Vaping Use   Vaping status: Never Used  Substance and Sexual Activity   Alcohol use: Yes    Comment: occasional   Drug use: Not Currently   Sexual activity: Yes    Birth control/protection: Surgical  Other Topics Concern   Not on file  Social History Narrative   Not on file   Social Determinants of Health   Financial Resource Strain: Not on file  Food Insecurity: No Food Insecurity (10/05/2022)   Hunger Vital Sign    Worried About Running Out of Food in the Last Year: Never true    Ran Out of Food in the Last Year: Never true  Transportation Needs: No Transportation Needs (10/05/2022)   PRAPARE - Administrator, Civil Service (Medical): No    Lack of Transportation (Non-Medical): No  Physical Activity: Not on file  Stress: Not on file  Social Connections: Not on file  Intimate Partner Violence: Not At Risk (10/05/2022)   Humiliation, Afraid, Rape, and Kick questionnaire    Fear of Current or Ex-Partner: No    Emotionally Abused: No    Physically Abused: No     Sexually Abused: No    FAMILY HISTORY: Family History  Problem Relation Age of Onset   Breast cancer Cousin        maternal first cousin   Uterine cancer Cousin        maternal first cousin    Review  of Systems  Constitutional:  Negative for appetite change, chills, fatigue, fever and unexpected weight change.  HENT:   Negative for hearing loss, lump/mass and trouble swallowing.   Eyes:  Negative for eye problems and icterus.  Respiratory:  Negative for chest tightness, cough and shortness of breath.   Cardiovascular:  Negative for chest pain, leg swelling and palpitations.  Gastrointestinal:  Negative for abdominal distention, abdominal pain, constipation, diarrhea, nausea and vomiting.  Endocrine: Negative for hot flashes.  Genitourinary:  Negative for difficulty urinating.   Musculoskeletal:  Negative for arthralgias.  Skin:  Negative for itching and rash.  Neurological:  Negative for dizziness, extremity weakness, headaches and numbness.  Hematological:  Negative for adenopathy. Does not bruise/bleed easily.  Psychiatric/Behavioral:  Negative for depression. The patient is not nervous/anxious.       PHYSICAL EXAMINATION     Vitals:   12/25/22 1202  BP: (!) 153/71  Pulse: 73  Resp: 17  Temp: 97.7 F (36.5 C)  SpO2: 100%     Physical Exam Constitutional:      General: She is not in acute distress.    Appearance: Normal appearance. She is not toxic-appearing.  HENT:     Head: Normocephalic and atraumatic.     Mouth/Throat:     Mouth: Mucous membranes are moist.     Pharynx: Oropharynx is clear. No oropharyngeal exudate or posterior oropharyngeal erythema.  Eyes:     General: No scleral icterus. Cardiovascular:     Rate and Rhythm: Normal rate and regular rhythm.     Pulses: Normal pulses.     Heart sounds: Normal heart sounds.  Pulmonary:     Effort: Pulmonary effort is normal.     Breath sounds: Normal breath sounds.  Abdominal:     General:  Abdomen is flat. Bowel sounds are normal. There is no distension.     Palpations: Abdomen is soft.     Tenderness: There is no abdominal tenderness.  Musculoskeletal:        General: Swelling (LUE lymphedema has significantly improved) present.     Cervical back: Neck supple.  Lymphadenopathy:     Cervical: No cervical adenopathy.  Skin:    General: Skin is warm and dry.     Findings: No rash.  Neurological:     General: No focal deficit present.     Mental Status: She is alert.  Psychiatric:        Mood and Affect: Mood normal.        Behavior: Behavior normal.     LABORATORY DATA:  CBC    Component Value Date/Time   WBC 6.0 12/25/2022 1137   WBC 7.8 10/07/2022 0105   RBC 4.47 12/25/2022 1137   HGB 11.1 (L) 12/25/2022 1137   HGB 10.5 (L) 01/20/2017 1057   HCT 34.5 (L) 12/25/2022 1137   HCT 25.8 (L) 01/05/2018 0513   HCT 32.8 (L) 01/20/2017 1057   PLT 171 12/25/2022 1137   PLT 368 01/20/2017 1057   MCV 77.2 (L) 12/25/2022 1137   MCV 77.8 (L) 01/20/2017 1057   MCH 24.8 (L) 12/25/2022 1137   MCHC 32.2 12/25/2022 1137   RDW 16.9 (H) 12/25/2022 1137   RDW 16.0 (H) 01/20/2017 1057   LYMPHSABS 1.7 12/25/2022 1137   LYMPHSABS 1.3 01/20/2017 1057   MONOABS 0.4 12/25/2022 1137   MONOABS 0.4 01/20/2017 1057   EOSABS 0.2 12/25/2022 1137   EOSABS 0.1 01/20/2017 1057   BASOSABS 0.0 12/25/2022 1137   BASOSABS 0.1 01/20/2017  1057    CMP     Component Value Date/Time   NA 139 12/05/2022 1407   NA 139 01/20/2017 1057   K 3.8 12/05/2022 1407   K 3.9 01/20/2017 1057   CL 103 12/05/2022 1407   CO2 30 12/05/2022 1407   CO2 25 01/20/2017 1057   GLUCOSE 120 (H) 12/05/2022 1407   GLUCOSE 135 01/20/2017 1057   BUN 19 12/05/2022 1407   BUN 15.6 01/20/2017 1057   CREATININE 0.73 12/05/2022 1407   CREATININE 0.7 01/20/2017 1057   CALCIUM 9.6 12/05/2022 1407   CALCIUM 9.3 01/20/2017 1057   PROT 7.2 12/05/2022 1407   PROT 7.1 01/20/2017 1057   ALBUMIN 3.9 12/05/2022 1407    ALBUMIN 3.4 (L) 01/20/2017 1057   AST 25 12/05/2022 1407   AST 10 01/20/2017 1057   ALT 15 12/05/2022 1407   ALT 14 01/20/2017 1057   ALKPHOS 61 12/05/2022 1407   ALKPHOS 69 01/20/2017 1057   BILITOT 0.4 12/05/2022 1407   BILITOT 0.43 01/20/2017 1057   GFRNONAA >60 12/05/2022 1407   GFRAA >60 07/14/2019 0538      ASSESSMENT and THERAPY PLAN:   Malignant neoplasm of lower-inner quadrant of left breast in female, estrogen receptor positive (HCC) Stellar Mrs. 89 year old woman who is here today for follow-up of her triple positive breast cancer recurrence in her left axilla, subpectoral, and supraclavicular lymph nodes.  1.  Recurrent regional triple positive breast cancer recurrence: She is on second line Kadcyla, tolerating this extremely well.  Most recent imaging with noted response.  Her left upper extremity lymphedema has also improved and this is consistent with response.  She does not have any significant side effects from Kadcyla.  I do not believe her left 1-1/2 finger numbness is necessarily related to Kadcyla.  She only complains of fatigue however she continues to remain independent and lives with her husband who recently celebrated his 89th birthday.  2.  Left arm lymphedema.  This has overall improved.  She will continue to do physical therapy.  3.  Grade 1 neuropathy, we will continue to monitor this  Echocardiogram ordered for the end of October.  Will see her back in about 3 weeks with repeat labs and Kadcyla.  Plan to continue Kadcyla as long as it is tolerated without significant toxicity or as long as there is no evidence of progression.   All questions were answered. The patient knows to call the clinic with any problems, questions or concerns. We can certainly see the patient much sooner if necessary.  Total encounter time:20 minutes*in face-to-face visit time, chart review, lab review, care coordination, order entry, and documentation of the encounter  time.    Lillard Anes, NP 12/25/22 12:17 PM Medical Oncology and Hematology [facility] [address] [place], [place] [address] Tel. [phone]    Fax. [phone]  *Total Encounter Time as defined by the Centers for Medicare and Medicaid Services includes, in addition to the face-to-face time of a patient visit (documented in the note above) non-face-to-face time: obtaining and reviewing outside history, ordering and reviewing medications, tests or procedures, care coordination (communications with other health care professionals or caregivers) and documentation in the medical record.

## 2022-12-25 NOTE — Patient Instructions (Signed)
[facility]  Discharge Instructions: Thank you for choosing [facility] to provide your oncology and hematology care.   If you have a lab appointment with the Cancer Center, please go directly to the Cancer Center and check in at the registration area.   Wear comfortable clothing and clothing appropriate for easy access to any Portacath or PICC line.   We strive to give you quality time with your provider. You may need to reschedule your appointment if you arrive late (15 or more minutes).  Arriving late affects you and other patients whose appointments are after yours.  Also, if you miss three or more appointments without notifying the office, you may be dismissed from the clinic at the provider's discretion.      For prescription refill requests, have your pharmacy contact our office and allow 72 hours for refills to be completed.    Today you received the following chemotherapy and/or immunotherapy agents: Kadcyla.       To help prevent nausea and vomiting after your treatment, we encourage you to take your nausea medication as directed.  BELOW ARE SYMPTOMS THAT SHOULD BE REPORTED IMMEDIATELY: *FEVER GREATER THAN 100.4 F (38 C) OR HIGHER *CHILLS OR SWEATING *NAUSEA AND VOMITING THAT IS NOT CONTROLLED WITH YOUR NAUSEA MEDICATION *UNUSUAL SHORTNESS OF BREATH *UNUSUAL BRUISING OR BLEEDING *URINARY PROBLEMS (pain or burning when urinating, or frequent urination) *BOWEL PROBLEMS (unusual diarrhea, constipation, pain near the anus) TENDERNESS IN MOUTH AND THROAT WITH OR WITHOUT PRESENCE OF ULCERS (sore throat, sores in mouth, or a toothache) UNUSUAL RASH, SWELLING OR PAIN  UNUSUAL VAGINAL DISCHARGE OR ITCHING   Items with * indicate a potential emergency and should be followed up as soon as possible or go to the Emergency Department if any problems should occur.  Please show the CHEMOTHERAPY ALERT CARD or IMMUNOTHERAPY ALERT CARD at  check-in to the Emergency Department and triage nurse.  Should you have questions after your visit or need to cancel or reschedule your appointment, please contact [facility]  Dept: [phone]  and follow the prompts.  Office hours are 8:00 a.m. to 4:30 p.m. Monday - Friday. Please note that voicemails left after 4:00 p.m. may not be returned until the following business day.  We are closed weekends and major holidays. You have access to a nurse at all times for urgent questions. Please call the main number to the clinic Dept: [phone] and follow the prompts.   For any non-urgent questions, you may also contact your provider using MyChart. We now offer e-Visits for anyone 18 and older to request care online for non-urgent symptoms. For details visit [email or web address].   Also download the MyChart app! Go to the app store, search "MyChart", open the app, select [facility], and log in with your MyChart username and password.   

## 2022-12-25 NOTE — Assessment & Plan Note (Signed)
Tami Mrs. 89 year old woman who is here today for follow-up of her triple positive breast cancer recurrence in her left axilla, subpectoral, and supraclavicular lymph nodes.  1.  Recurrent regional triple positive breast cancer recurrence: She is on second line Kadcyla, tolerating this extremely well.  Most recent imaging with noted response.  Her left upper extremity lymphedema has also improved and this is consistent with response.  She does not have any significant side effects from Kadcyla.  I do not believe her left 1-1/2 finger numbness is necessarily related to Kadcyla.  She only complains of fatigue however she continues to remain independent and lives with her husband who recently celebrated his 89th birthday.  2.  Left arm lymphedema.  This has overall improved.  She will continue to do physical therapy.  3.  Grade 1 neuropathy, we will continue to monitor this  Echocardiogram ordered for the end of October.  Will see her back in about 3 weeks with repeat labs and Kadcyla.  Plan to continue Kadcyla as long as it is tolerated without significant toxicity or as long as there is no evidence of progression.

## 2022-12-30 ENCOUNTER — Other Ambulatory Visit: Payer: Self-pay

## 2023-01-03 ENCOUNTER — Other Ambulatory Visit: Payer: Self-pay

## 2023-01-03 ENCOUNTER — Encounter (HOSPITAL_BASED_OUTPATIENT_CLINIC_OR_DEPARTMENT_OTHER): Payer: Self-pay

## 2023-01-03 ENCOUNTER — Emergency Department (HOSPITAL_BASED_OUTPATIENT_CLINIC_OR_DEPARTMENT_OTHER): Payer: Medicare Other | Admitting: Radiology

## 2023-01-03 ENCOUNTER — Emergency Department (HOSPITAL_BASED_OUTPATIENT_CLINIC_OR_DEPARTMENT_OTHER)
Admission: EM | Admit: 2023-01-03 | Discharge: 2023-01-03 | Disposition: A | Discharge status: Home / Self Care | Payer: Medicare Other | Attending: Emergency Medicine | Admitting: Emergency Medicine

## 2023-01-03 DIAGNOSIS — E119 Type 2 diabetes mellitus without complications: Secondary | ICD-10-CM | POA: Diagnosis not present

## 2023-01-03 DIAGNOSIS — Z79899 Other long term (current) drug therapy: Secondary | ICD-10-CM | POA: Diagnosis not present

## 2023-01-03 DIAGNOSIS — I10 Essential (primary) hypertension: Secondary | ICD-10-CM | POA: Insufficient documentation

## 2023-01-03 DIAGNOSIS — Z7984 Long term (current) use of oral hypoglycemic drugs: Secondary | ICD-10-CM | POA: Diagnosis not present

## 2023-01-03 DIAGNOSIS — M545 Low back pain, unspecified: Secondary | ICD-10-CM | POA: Diagnosis present

## 2023-01-03 DIAGNOSIS — Z853 Personal history of malignant neoplasm of breast: Secondary | ICD-10-CM | POA: Insufficient documentation

## 2023-01-03 DIAGNOSIS — M546 Pain in thoracic spine: Secondary | ICD-10-CM | POA: Insufficient documentation

## 2023-01-03 DIAGNOSIS — K5900 Constipation, unspecified: Secondary | ICD-10-CM | POA: Insufficient documentation

## 2023-01-03 LAB — URINALYSIS, ROUTINE W REFLEX MICROSCOPIC
Bilirubin Urine: NEGATIVE
Glucose, UA: NEGATIVE mg/dL
Hgb urine dipstick: NEGATIVE
Ketones, ur: NEGATIVE mg/dL
Leukocytes,Ua: NEGATIVE
Nitrite: NEGATIVE
Protein, ur: NEGATIVE mg/dL
Specific Gravity, Urine: 1.009 (ref 1.005–1.030)
pH: 7.5 (ref 5.0–8.0)

## 2023-01-03 MED ORDER — KETOROLAC TROMETHAMINE 60 MG/2ML IM SOLN
60.0000 mg | Freq: Once | INTRAMUSCULAR | Status: AC
  Administered 2023-01-03: 60 mg via INTRAMUSCULAR
  Filled 2023-01-03: qty 2

## 2023-01-03 MED ORDER — LIDOCAINE 5 % EX PTCH
1.0000 | MEDICATED_PATCH | CUTANEOUS | Status: DC
  Administered 2023-01-03: 1 via TRANSDERMAL
  Filled 2023-01-03: qty 1

## 2023-01-03 MED ORDER — OXYCODONE HCL 5 MG PO TABS
5.0000 mg | ORAL_TABLET | Freq: Four times a day (QID) | ORAL | 0 refills | Status: DC | PRN
Start: 2023-01-03 — End: 2023-01-05

## 2023-01-03 MED ORDER — LIDOCAINE 5 % EX PTCH: 1.0000 | MEDICATED_PATCH | CUTANEOUS | 0 refills | Status: DC

## 2023-01-03 MED ORDER — OXYCODONE HCL 5 MG PO TABS
5.0000 mg | ORAL_TABLET | Freq: Once | ORAL | Status: AC
  Administered 2023-01-03: 5 mg via ORAL
  Filled 2023-01-03: qty 1

## 2023-01-03 NOTE — ED Triage Notes (Signed)
Pt presents with L lower back pain without radiation x 2 days. Pt denies injury or urinary symptoms. Pt has taken Tylenol with minimal relief.

## 2023-01-03 NOTE — Care Management (Signed)
PCP placed on AVS

## 2023-01-03 NOTE — ED Notes (Signed)
Pt stated she unable to urinate at this time. Provider Bender verbally made it aware to provider Bender. Urine cup at bedside

## 2023-01-03 NOTE — ED Provider Notes (Signed)
[facility] AT [facility] Provider Note   CSN: [id number] Arrival date & time: 01/03/23  1141     History  Chief Complaint  Patient presents with   Back Pain    Tami Schmidt is a 89 y.o. female with a past medical history of active breast cancer, pulmonary emboli not currently anticoagulated, hypertension, and type 2 diabetes melitis who presents emergency department for evaluation of acute lower back pain on the left side that started about 2 days ago.  She believes that her back pain is caused from limping with her right leg and that this is possibly "threw her back out ".  She denies symptoms of urinary or fecal incontinence, changes in sensation in the genitals or rectum, or lower extremity weakness. Patient stated that the pain worsens with movement and will improve with rest.  Patient denied recent falls or trauma.  She denied recent lower extremity edema, travel, surgery.  She denied abdominal pain, heartburn, diarrhea.  Patient reports chronic constipation (she has a bowel movement once a week, this is normal for her).      Home Medications Prior to Admission medications   Medication Sig Start Date End Date Taking? Authorizing Provider  lidocaine (LIDODERM) 5 % Place 1 patch onto the skin daily. Remove & Discard patch within 12 hours or as directed by MD 01/03/23  Yes Liesel Peckenpaugh, Irving Burton, DO  oxyCODONE (ROXICODONE) 5 MG immediate release tablet Take 1 tablet (5 mg total) by mouth every 6 (six) hours as needed for severe pain or breakthrough pain. Do not drive while using his medication.  This medication may make you feel tired. 01/03/23  Yes Ziyanna Tolin, Irving Burton, DO  amLODipine (NORVASC) 10 MG tablet Take 10 mg by mouth daily.    [provider]  metFORMIN (GLUCOPHAGE) 500 MG tablet Take 500 mg by mouth daily with breakfast.    [provider]  olmesartan-hydrochlorothiazide (BENICAR HCT) 40-12.5 MG tablet Take 1 tablet by mouth daily.     [provider]  SM IRON 325 (65 Fe) MG tablet Take 325 mg by mouth daily. 07/22/19   [provider]  Turmeric (QC TUMERIC COMPLEX PO) Take 1 tablet by mouth daily. Patient not taking: Reported on 12/05/2022    [provider]  TYLENOL 325 MG tablet Take 325 mg by mouth daily. 10/26/20   [provider]  VITAMIN D PO Take 1 tablet by mouth daily.    [provider]      Allergies    Bactrim, Lisinopril, Vasotec, and Sulfamethoxazole-trimethoprim    Review of Systems   Review of Systems  Gastrointestinal:  Positive for constipation (Goes once per week, this is "normal" for her). Negative for abdominal distention, abdominal pain and nausea.  Genitourinary:  Negative for dysuria, frequency and hematuria.  Musculoskeletal:  Positive for back pain.  Neurological:  Negative for weakness.    Physical Exam Updated Vital Signs BP (!) 180/84   Pulse 71   Temp 98.3 F (36.8 C) (Oral)   Resp (!) 24   Ht 5\' 7"  (1.702 m)   Wt 81.6 kg   SpO2 98%   BMI 28.19 kg/m  Physical Exam Vitals reviewed.  Constitutional:      General: She is not in acute distress.    Appearance: She is not ill-appearing, toxic-appearing or diaphoretic.  Cardiovascular:     Rate and Rhythm: Normal rate. Rhythm irregular.     Pulses:  Radial pulses are 2+ on the right side and 2+ on the left side.     Heart sounds: Normal heart sounds. No murmur heard. Pulmonary:     Effort: Pulmonary effort is normal. No respiratory distress.     Breath sounds: Normal breath sounds.  Abdominal:     General: Abdomen is protuberant. Bowel sounds are normal. There is no distension.     Palpations: Abdomen is soft.     Tenderness: There is no abdominal tenderness. There is no guarding.  Musculoskeletal:     Thoracic back: Spasms (Left) and tenderness (Left) present.     Lumbar back: Spasms (Left) and tenderness (Left) present. No bony tenderness.     Right lower leg: No edema.      Left lower leg: No edema.  Skin:    General: Skin is warm and dry.  Neurological:     Mental Status: She is alert and oriented to person, place, and time.  Psychiatric:        Mood and Affect: Mood normal.     ED Results / Procedures / Treatments   Labs (all labs ordered are listed, but only abnormal results are displayed) Labs Reviewed  URINALYSIS, ROUTINE W REFLEX MICROSCOPIC    EKG None  Radiology No results found.  Procedures Procedures    Medications Ordered in ED Medications  lidocaine (LIDODERM) 5 % 1 patch (1 patch Transdermal Patch Applied 01/03/23 1341)  ketorolac (TORADOL) injection 60 mg (60 mg Intramuscular Given 01/03/23 1341)  oxyCODONE (Oxy IR/ROXICODONE) immediate release tablet 5 mg (5 mg Oral Given 01/03/23 1341)    ED Course/ Medical Decision Making/ A&P                                 Medical Decision Making Patient is an 89 year old female who presents to the emergency department for evaluation of acute left sided thoracic and lumbar back pain.  Initial evaluation the patient, she is not in acute distress, well-appearing, initial vital signs do show hypertensive at 166/82.  Physical examination shows left thoracic and left lumbar muscle tightness with tenderness on palpation.  Initial differential includes muscle spasm, UTI, constipation, metastatic osseous breast cancer, and cauda equina syndrome.  Cauda equina syndrome is less likely at this time, she denies urinary or fecal incontinence and also denies changes of sensation and motor of lower extremities including the genitals and rectum.  Cancer metastasis at this time is also less likely due to a recent CT scan in the beginning of September which did not show osseous metastases; however, may consider outpatient MRI with oncology.  Symptomology of left-sided lower back pain with pain that worsens with movement and improves with rest, is congruent with musculoskeletal pain.  Back x-ray shows Multilevel  degenerative disc disease.   Patient was discharged home in stable condition with recommendations to alternate ibuprofen and Tylenol along with a lidocaine patch and oxycodone 5 mg provided for breakthrough or severe pain.  Patient was also provided with referrals for PCP and neurosurgeon.  Amount and/or Complexity of Data Reviewed Labs: ordered.    Details: Urinalysis is normal Radiology: ordered.    Details: Agree with radiologist findings ECG/medicine tests: ordered.    Details: Sinus rhythm with supraventricular bigeminy.  Risk Prescription drug management.           Final Clinical Impression(s) / ED Diagnoses Final diagnoses:  Acute left-sided low back pain without sciatica  Rx / DC Orders ED Discharge Orders          Ordered    lidocaine (LIDODERM) 5 %  Every 24 hours        01/03/23 1412    oxyCODONE (ROXICODONE) 5 MG immediate release tablet  Every 6 hours PRN        01/03/23 1412              Faith Rogue, DO 01/03/23 1427    Alvira Monday, MD 01/03/23 2105

## 2023-01-03 NOTE — ED Notes (Signed)
Pt has weak and unsteady gait. Yellow sock, fall risk band, yellow fall risk sign placed outside of door. Husband at bedside, personal belongings and call bell within reach. Pt educated on the importance of using the call bell for assistance before attempting to ambulate.

## 2023-01-03 NOTE — ED Notes (Signed)
Pt toileted using the bed pain with assistance. Urine sample collect, bed sheets changed. Clean dry

## 2023-01-03 NOTE — ED Notes (Signed)
Patient verbalizes understanding of discharge instructions. Opportunity for questioning and answers were provided. Patient discharged from ED.

## 2023-01-03 NOTE — Discharge Instructions (Addendum)
You came into the emergency department with left-sided back pain.  An x-ray did not show a fracture in the back.  Please use Tylenol and ibuprofen alternating for pain control.  We also prescribed you lidocaine patches that you will place once a day.  If you are still having pain or for severe breakthrough pain, please take 1 oxycodone 5 mg every 6 hours as needed.  Please do not drive while taking this medication, you may feel sleepy or tired.  We have provided you information to become established with a primary care provider and to become established with a spine doctor.

## 2023-01-03 NOTE — ED Notes (Signed)
Pt stated she is unable to urinate at this time. Provder

## 2023-01-05 ENCOUNTER — Other Ambulatory Visit: Payer: Self-pay

## 2023-01-05 ENCOUNTER — Inpatient Hospital Stay (HOSPITAL_BASED_OUTPATIENT_CLINIC_OR_DEPARTMENT_OTHER)
Admission: EM | Admit: 2023-01-05 | Discharge: 2023-01-08 | DRG: 378 | Disposition: A | Discharge status: Home / Self Care | Payer: Medicare Other | Attending: Internal Medicine | Admitting: Internal Medicine

## 2023-01-05 ENCOUNTER — Encounter (HOSPITAL_BASED_OUTPATIENT_CLINIC_OR_DEPARTMENT_OTHER): Payer: Self-pay | Admitting: Pediatrics

## 2023-01-05 DIAGNOSIS — D696 Thrombocytopenia, unspecified: Secondary | ICD-10-CM | POA: Diagnosis not present

## 2023-01-05 DIAGNOSIS — Z96641 Presence of right artificial hip joint: Secondary | ICD-10-CM | POA: Diagnosis present

## 2023-01-05 DIAGNOSIS — E119 Type 2 diabetes mellitus without complications: Secondary | ICD-10-CM | POA: Diagnosis present

## 2023-01-05 DIAGNOSIS — Z6828 Body mass index (BMI) 28.0-28.9, adult: Secondary | ICD-10-CM

## 2023-01-05 DIAGNOSIS — K625 Hemorrhage of anus and rectum: Secondary | ICD-10-CM | POA: Diagnosis present

## 2023-01-05 DIAGNOSIS — Z23 Encounter for immunization: Secondary | ICD-10-CM

## 2023-01-05 DIAGNOSIS — I10 Essential (primary) hypertension: Secondary | ICD-10-CM | POA: Diagnosis not present

## 2023-01-05 DIAGNOSIS — K5731 Diverticulosis of large intestine without perforation or abscess with bleeding: Secondary | ICD-10-CM | POA: Diagnosis not present

## 2023-01-05 DIAGNOSIS — Z79810 Long term (current) use of selective estrogen receptor modulators (SERMs): Secondary | ICD-10-CM

## 2023-01-05 DIAGNOSIS — M549 Dorsalgia, unspecified: Secondary | ICD-10-CM | POA: Diagnosis present

## 2023-01-05 DIAGNOSIS — Z79899 Other long term (current) drug therapy: Secondary | ICD-10-CM | POA: Diagnosis not present

## 2023-01-05 DIAGNOSIS — Z86711 Personal history of pulmonary embolism: Secondary | ICD-10-CM

## 2023-01-05 DIAGNOSIS — D62 Acute posthemorrhagic anemia: Secondary | ICD-10-CM | POA: Diagnosis present

## 2023-01-05 DIAGNOSIS — D649 Anemia, unspecified: Secondary | ICD-10-CM | POA: Diagnosis not present

## 2023-01-05 DIAGNOSIS — E8809 Other disorders of plasma-protein metabolism, not elsewhere classified: Secondary | ICD-10-CM | POA: Diagnosis not present

## 2023-01-05 DIAGNOSIS — Z170 Estrogen receptor positive status [ER+]: Secondary | ICD-10-CM

## 2023-01-05 DIAGNOSIS — Z803 Family history of malignant neoplasm of breast: Secondary | ICD-10-CM | POA: Diagnosis not present

## 2023-01-05 DIAGNOSIS — Z923 Personal history of irradiation: Secondary | ICD-10-CM | POA: Diagnosis not present

## 2023-01-05 DIAGNOSIS — K5521 Angiodysplasia of colon with hemorrhage: Secondary | ICD-10-CM | POA: Diagnosis not present

## 2023-01-05 DIAGNOSIS — C50312 Malignant neoplasm of lower-inner quadrant of left female breast: Secondary | ICD-10-CM | POA: Diagnosis present

## 2023-01-05 DIAGNOSIS — E7800 Pure hypercholesterolemia, unspecified: Secondary | ICD-10-CM | POA: Diagnosis not present

## 2023-01-05 DIAGNOSIS — Z888 Allergy status to other drugs, medicaments and biological substances status: Secondary | ICD-10-CM | POA: Diagnosis not present

## 2023-01-05 DIAGNOSIS — Z90710 Acquired absence of both cervix and uterus: Secondary | ICD-10-CM

## 2023-01-05 DIAGNOSIS — Z87891 Personal history of nicotine dependence: Secondary | ICD-10-CM | POA: Diagnosis not present

## 2023-01-05 DIAGNOSIS — Z9013 Acquired absence of bilateral breasts and nipples: Secondary | ICD-10-CM

## 2023-01-05 DIAGNOSIS — E876 Hypokalemia: Secondary | ICD-10-CM | POA: Diagnosis not present

## 2023-01-05 DIAGNOSIS — Z8049 Family history of malignant neoplasm of other genital organs: Secondary | ICD-10-CM

## 2023-01-05 DIAGNOSIS — E663 Overweight: Secondary | ICD-10-CM | POA: Diagnosis present

## 2023-01-05 DIAGNOSIS — Z882 Allergy status to sulfonamides status: Secondary | ICD-10-CM

## 2023-01-05 DIAGNOSIS — K922 Gastrointestinal hemorrhage, unspecified: Principal | ICD-10-CM | POA: Diagnosis present

## 2023-01-05 DIAGNOSIS — C779 Secondary and unspecified malignant neoplasm of lymph node, unspecified: Secondary | ICD-10-CM | POA: Diagnosis not present

## 2023-01-05 DIAGNOSIS — Z7984 Long term (current) use of oral hypoglycemic drugs: Secondary | ICD-10-CM

## 2023-01-05 DIAGNOSIS — K649 Unspecified hemorrhoids: Secondary | ICD-10-CM | POA: Diagnosis present

## 2023-01-05 DIAGNOSIS — A045 Campylobacter enteritis: Secondary | ICD-10-CM | POA: Diagnosis not present

## 2023-01-05 DIAGNOSIS — R5383 Other fatigue: Secondary | ICD-10-CM | POA: Diagnosis present

## 2023-01-05 LAB — COMPREHENSIVE METABOLIC PANEL
ALT: 18 U/L (ref 0–44)
AST: 28 U/L (ref 15–41)
Albumin: 3.9 g/dL (ref 3.5–5.0)
Alkaline Phosphatase: 57 U/L (ref 38–126)
Anion gap: 10 (ref 5–15)
BUN: 25 mg/dL — ABNORMAL HIGH (ref 8–23)
CO2: 26 mmol/L (ref 22–32)
Calcium: 9.5 mg/dL (ref 8.9–10.3)
Chloride: 102 mmol/L (ref 98–111)
Creatinine, Ser: 0.87 mg/dL (ref 0.44–1.00)
GFR, Estimated: >60 mL/min (ref >60)
Glucose, Bld: 139 mg/dL — ABNORMAL HIGH (ref 70–99)
Potassium: 3.6 mmol/L (ref 3.5–5.1)
Sodium: 138 mmol/L (ref 135–145)
Total Bilirubin: 0.5 mg/dL (ref 0.3–1.2)
Total Protein: 7.2 g/dL (ref 6.5–8.1)

## 2023-01-05 LAB — HEMOGLOBIN AND HEMATOCRIT, BLOOD
HCT: 34.5 % — ABNORMAL LOW (ref 36.0–46.0)
Hemoglobin: 10.6 g/dL — ABNORMAL LOW (ref 12.0–15.0)

## 2023-01-05 LAB — CBC WITH DIFFERENTIAL/PLATELET
Abs Immature Granulocytes: 0.01 10*3/uL (ref 0.00–0.07)
Basophils Absolute: 0 10*3/uL (ref 0.0–0.1)
Basophils Relative: 0 %
Eosinophils Absolute: 0.1 10*3/uL (ref 0.0–0.5)
Eosinophils Relative: 2 %
HCT: 34.7 % — ABNORMAL LOW (ref 36.0–46.0)
Hemoglobin: 11 g/dL — ABNORMAL LOW (ref 12.0–15.0)
Immature Granulocytes: 0 %
Lymphocytes Relative: 23 %
Lymphs Abs: 1.3 10*3/uL (ref 0.7–4.0)
MCH: 24.1 pg — ABNORMAL LOW (ref 26.0–34.0)
MCHC: 31.7 g/dL (ref 30.0–36.0)
MCV: 75.9 fL — ABNORMAL LOW (ref 80.0–100.0)
Monocytes Absolute: 0.4 10*3/uL (ref 0.1–1.0)
Monocytes Relative: 7 %
Neutro Abs: 4 10*3/uL (ref 1.7–7.7)
Neutrophils Relative %: 68 %
Platelets: 146 10*3/uL — ABNORMAL LOW (ref 150–400)
RBC: 4.57 MIL/uL (ref 3.87–5.11)
RDW: 17.3 % — ABNORMAL HIGH (ref 11.5–15.5)
WBC: 5.8 10*3/uL (ref 4.0–10.5)
nRBC: 0 % (ref 0.0–0.2)

## 2023-01-05 LAB — OCCULT BLOOD X 1 CARD TO LAB, STOOL: Fecal Occult Bld: NEGATIVE

## 2023-01-05 MED ORDER — POLYETHYLENE GLYCOL 3350 17 G PO PACK: 17.0000 g | PACK | Freq: Every day | ORAL | Status: DC | PRN

## 2023-01-05 MED ORDER — ACETAMINOPHEN 325 MG PO TABS
650.0000 mg | ORAL_TABLET | Freq: Four times a day (QID) | ORAL | Status: DC | PRN
  Administered 2023-01-08: 650 mg via ORAL
  Filled 2023-01-05: qty 2

## 2023-01-05 MED ORDER — ONDANSETRON HCL 4 MG/2ML IJ SOLN: 4.0000 mg | Freq: Four times a day (QID) | INTRAMUSCULAR | Status: DC | PRN

## 2023-01-05 MED ORDER — ONDANSETRON HCL 4 MG PO TABS: 4.0000 mg | ORAL_TABLET | Freq: Four times a day (QID) | ORAL | Status: DC | PRN

## 2023-01-05 MED ORDER — SODIUM CHLORIDE 0.9 % IV SOLN: INTRAVENOUS | Status: DC

## 2023-01-05 MED ORDER — ALBUTEROL SULFATE (2.5 MG/3ML) 0.083% IN NEBU: 2.5000 mg | INHALATION_SOLUTION | RESPIRATORY_TRACT | Status: DC | PRN

## 2023-01-05 MED ORDER — PANTOPRAZOLE SODIUM 40 MG IV SOLR
40.0000 mg | Freq: Every day | INTRAVENOUS | Status: DC
  Administered 2023-01-05: 40 mg via INTRAVENOUS
  Filled 2023-01-05: qty 10

## 2023-01-05 MED ORDER — OXYCODONE HCL 5 MG PO TABS
5.0000 mg | ORAL_TABLET | Freq: Four times a day (QID) | ORAL | Status: DC | PRN
  Administered 2023-01-05: 5 mg via ORAL
  Filled 2023-01-05: qty 1

## 2023-01-05 MED ORDER — HYDROMORPHONE HCL 1 MG/ML IJ SOLN: 0.5000 mg | INTRAMUSCULAR | Status: DC | PRN

## 2023-01-05 MED ORDER — ACETAMINOPHEN 650 MG RE SUPP: 650.0000 mg | Freq: Four times a day (QID) | RECTAL | Status: DC | PRN

## 2023-01-05 MED ORDER — LIDOCAINE 5 % EX PTCH
1.0000 | MEDICATED_PATCH | CUTANEOUS | Status: DC
  Administered 2023-01-05 – 2023-01-07 (×3): 1 via TRANSDERMAL
  Filled 2023-01-05 (×3): qty 1

## 2023-01-05 MED ORDER — HYDRALAZINE HCL 20 MG/ML IJ SOLN: 10.0000 mg | Freq: Four times a day (QID) | INTRAMUSCULAR | Status: DC | PRN

## 2023-01-05 MED ORDER — BISACODYL 10 MG RE SUPP: 10.0000 mg | Freq: Every day | RECTAL | Status: DC | PRN

## 2023-01-05 NOTE — H&P (Signed)
History and Physical    Patient: Tami Schmidt:[id number] DOB: 04/11/1933 DOA: 01/05/2023 DOS: the patient was seen and examined on 01/05/2023 PCP: Pcp, No  Patient coming from: Home  Chief Complaint:  Chief Complaint  Patient presents with   Rectal Bleeding   HPI: Tami Schmidt is a 89 y.o. 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  States that she was feeling constipated and took a laxative yesterday and after she took a laxative she started having bright red blood per rectum and states that the blood was intermixed with her stool.  Denied any nausea, vomiting, chest pain, shortness breath, lightheadedness or dizziness or burning or discomfort in her urine.  States that she is being treated for her cancer and had chemotherapy last week.  States that this has happened to her before prior.  Patient also states that by the time she came to the ED that the blood had stopped and she has had 2 bloody episodes.  FOBT was negative on occult examination.  Given her symptoms and her concern [facility] was asked to admit this patient for observation of her lower GI bleeding and gastroenterology has been consulted and recommending observation overnight with clear liquid diet for now and placed on IV PPI.  If she continues to have bloody bowel movements they are able to consider possible scopes.  Review of Systems: As mentioned in the history of present illness. All other systems reviewed and are negative. 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   Diabetes mellitus    fasting  90-100   HTN (hypertension) 03/25/2013   Hx of radiation therapy    breasts hx   Hypercholesterolemia    Hypertension    Lymphedema of arm    left arm   Pulmonary embolism (HCC)    Type II or unspecified type diabetes mellitus without mention of complication, not stated as uncontrolled 03/25/2013   Past Surgical History:  Procedure Laterality Date   ABDOMINAL HYSTERECTOMY     ANKLE SURGERY Right 1995   APPENDECTOMY     BIOPSY  01/07/2018   Procedure: BIOPSY;  Surgeon: Kerin Salen, MD;  Location: [facility];  Service: Gastroenterology;;   BREAST SURGERY Bilateral    mastectomy   COLONOSCOPY WITH PROPOFOL N/A 01/07/2018   Procedure: COLONOSCOPY WITH PROPOFOL;  Surgeon: Kerin Salen, MD;  Location: [facility];  Service: Gastroenterology;  Laterality: N/A;   ESOPHAGOGASTRODUODENOSCOPY (EGD) WITH PROPOFOL N/A 01/06/2018   Procedure: ESOPHAGOGASTRODUODENOSCOPY (EGD) WITH PROPOFOL;  Surgeon: Kerin Salen, MD;  Location: [facility];  Service: Gastroenterology;  Laterality: N/A;   HERNIA REPAIR  04-26-2010   IR IMAGING GUIDED PORT INSERTION  10/29/2021   MASS EXCISION Left 01/26/2013   Procedure: EXCISION LEFT CHEST WALL MASS AND LEFT ABDOMNAL WALL MASS;  Surgeon: [name], MD;  Location: [facility];  Service: General;  Laterality: Left;   MASS EXCISION Left 09/20/2014   Procedure: EXCISION OF LEFT CHEST WALL MASS;  Surgeon: [name], MD;  Location: [facility];  Service: General;  Laterality: Left;   TEE WITHOUT  CARDIOVERSION N/A 12/08/2017   Procedure: TRANSESOPHAGEAL ECHOCARDIOGRAM (TEE);  Surgeon: Lewayne Bunting, MD;  Location: [facility];  Service: Cardiovascular;  Laterality: N/A;   TOTAL HIP ARTHROPLASTY Right 01/05/2017   TOTAL HIP ARTHROPLASTY Right 01/05/2017   Procedure: TOTAL HIP ARTHROPLASTY ANTERIOR APPROACH;  Surgeon: Gean Birchwood, MD;  Location: [facility];  Service: Orthopedics;  Laterality: Right;   Social History:  reports that she quit smoking  about 50 years ago. Her smoking use included cigarettes. She has never used smokeless tobacco. She reports current alcohol use. She reports that she does not currently use drugs.  Allergies  Allergen Reactions   Bactrim Swelling    SWELLING OF MOUTH/FACE.   Lisinopril Swelling    SWELLING OF MOUTH/FACE.   Vasotec Swelling    SWELLING OF MOUTH/FACE.   Sulfamethoxazole-Trimethoprim     Other reaction(s): Unknown    Family History  Problem Relation Age of Onset   Breast cancer Cousin        maternal first cousin   Uterine cancer Cousin        maternal first cousin    Prior to Admission medications   Medication Sig Start Date End Date Taking? Authorizing Provider  amLODipine (NORVASC) 10 MG tablet Take 10 mg by mouth daily.    [provider]  lidocaine (LIDODERM) 5 % Place 1 patch onto the skin daily. Remove & Discard patch within 12 hours or as directed by MD 01/03/23   Faith Rogue, DO  metFORMIN (GLUCOPHAGE) 500 MG tablet Take 500 mg by mouth daily with breakfast.    [provider]  olmesartan-hydrochlorothiazide (BENICAR HCT) 40-12.5 MG tablet Take 1 tablet by mouth daily.    [provider]  oxyCODONE (ROXICODONE) 5 MG immediate release tablet Take 1 tablet (5 mg total) by mouth every 6 (six) hours as needed for severe pain or breakthrough pain. Do not drive while using his medication.  This medication may make you feel tired. 01/03/23   Faith Rogue, DO  SM IRON 325 (65 Fe) MG tablet Take 325 mg by mouth daily. 07/22/19   [provider]  Turmeric (QC TUMERIC COMPLEX PO) Take 1 tablet by mouth daily. Patient not taking: Reported on 12/05/2022    [provider]  TYLENOL 325 MG tablet Take 325 mg by mouth daily. 10/26/20   [provider]  VITAMIN D PO Take 1 tablet by mouth daily.    [provider]    Physical Exam: Vitals:   01/05/23 1441 01/05/23 1500 01/05/23 1530 01/05/23 1630  BP: 138/79 128/68 132/73 133/76   Pulse: 73 76 77 70  Resp: 16     Temp: 97.7 F (36.5 C)     TempSrc: Oral     SpO2: 100% 99% 98% 95%  Weight:      Height:       Examination: Physical Exam:  Constitutional: WN/WD overweight African-American female in no acute distress Respiratory: Diminished to auscultation bilaterally, no wheezing, rales, rhonchi or crackles. Normal respiratory effort and patient is not tachypenic. No accessory muscle use.  Unlabored breathing Cardiovascular: RRR, no murmurs / rubs / gallops. S1 and S2 auscultated. No extremity edema.  Abdomen: Soft, non-tender, distended secondary body habitus. Bowel sounds positive.  GU: Deferred. Musculoskeletal: No clubbing / cyanosis of digits/nails. No joint deformity upper and lower extremities.  Skin: No rashes, lesions, ulcers on limited skin evaluation. No induration; Warm and dry.  Neurologic: CN 2-12 grossly intact with no focal deficits. Romberg sign and cerebellar  reflexes not assessed.  Psychiatric: Normal judgment and insight. Alert and oriented x 3. Normal mood and appropriate affect.   Data Reviewed:  Recent Results (from the past 2160 hour(s))  CMP (Cancer Center only)     Status: None   Collection Time: 10/15/22  1:24 PM  Result Value Ref Range   Sodium 139 135 - 145 mmol/L   Potassium 3.8 3.5 - 5.1 mmol/L   Chloride 104 98 - 111 mmol/L   CO2 25 22 - 32 mmol/L   Glucose, Bld 95 70 - 99 mg/dL    Comment: Glucose reference range applies only to samples taken after fasting for at least 8 hours.   BUN 12 8 - 23 mg/dL   Creatinine 8.11 9.14 - 1.00 mg/dL   Calcium 9.3 8.9 - 78.2 mg/dL   Total Protein 6.6 6.5 - 8.1 g/dL   Albumin 3.7 3.5 - 5.0 g/dL   AST 18 15 - 41 U/L   ALT 9 0 - 44 U/L   Alkaline Phosphatase 55 38 - 126 U/L   Total Bilirubin 0.4 0.3 - 1.2 mg/dL   GFR, Estimated >95 >62 mL/min    Comment: (NOTE) Calculated using the CKD-EPI Creatinine Equation (2021)    Anion gap 10 5 - 15    Comment: Performed at [facility], [address] [place], [place] [address]  CBC with Differential (Cancer Center Only)     Status: Abnormal   Collection Time: 10/15/22  1:24 PM  Result Value Ref Range   WBC Count 8.5 4.0 - 10.5 K/uL   RBC 3.58 (L) 3.87 - 5.11 MIL/uL   Hemoglobin 9.2 (L) 12.0 - 15.0 g/dL   HCT 57.8 (L) 46.9 - 62.9 %   MCV 81.8 80.0 - 100.0 fL   MCH 25.7 (L) 26.0 - 34.0 pg   MCHC 31.4 30.0 - 36.0 g/dL   RDW 52.8 (H) 41.3 - 24.4 %   Platelet Count 303 150 - 400 K/uL   nRBC 0.0 0.0 - 0.2 %   Neutrophils Relative % 72 %   Neutro Abs 6.2 1.7 - 7.7 K/uL   Lymphocytes Relative 19 %   Lymphs Abs 1.6 0.7 - 4.0 K/uL   Monocytes Relative 6 %   Monocytes Absolute 0.5 0.1 - 1.0 K/uL   Eosinophils Relative 2 %   Eosinophils Absolute 0.1 0.0 - 0.5 K/uL   Basophils Relative 0 %   Basophils Absolute 0.0 0.0 - 0.1 K/uL   Immature Granulocytes 1 %   Abs Immature Granulocytes 0.05 0.00 - 0.07 K/uL    Comment: Performed at [facility], [address] [place], [place] [address]  CA 27.29     Status: None   Collection Time: 10/15/22  1:24 PM  Result Value Ref Range   CA 27.29 24.9 0.0 - 38.6 U/mL    Comment: (NOTE) Siemens Centaur Immunochemiluminometric Methodology [facility]) Values obtained with different assay methods or kits cannot be used interchangeably. Results cannot be interpreted as absolute evidence of the presence or absence of malignant disease. Performed At: [facility] [address] [employer], [place] [id number] Jolene Schimke MD KV:[id number]   Hemoglobin A1c     Status: None   Collection Time: 10/15/22  1:34 PM  Result Value Ref Range   Hgb A1c MFr Bld 5.5 4.8 - 5.6 %    Comment: (NOTE) Pre diabetes:          5.7%-6.4%  Diabetes:              >  6.4%  Glycemic control for   <7.0% adults with diabetes    Mean Plasma Glucose 111.15 mg/dL    Comment: Performed at [facility], [address] [place], [place] [address]  CMP (Cancer  Center only)     Status: Abnormal   Collection Time: 11/06/22  8:57 AM  Result Value Ref Range   Sodium 139 135 - 145 mmol/L   Potassium 3.8 3.5 - 5.1 mmol/L   Chloride 105 98 - 111 mmol/L   CO2 27 22 - 32 mmol/L   Glucose, Bld 218 (H) 70 - 99 mg/dL    Comment: Glucose reference range applies only to samples taken after fasting for at least 8 hours.   BUN 16 8 - 23 mg/dL   Creatinine 6.04 5.40 - 1.00 mg/dL   Calcium 9.3 8.9 - 98.1 mg/dL   Total Protein 6.7 6.5 - 8.1 g/dL   Albumin 3.8 3.5 - 5.0 g/dL   AST 23 15 - 41 U/L   ALT 13 0 - 44 U/L   Alkaline Phosphatase 58 38 - 126 U/L   Total Bilirubin 0.4 0.3 - 1.2 mg/dL   GFR, Estimated >19 >14 mL/min    Comment: (NOTE) Calculated using the CKD-EPI Creatinine Equation (2021)    Anion gap 7 5 - 15    Comment: Performed at [facility], [address], [place], [place] [address]  CBC with Differential (Cancer Center Only)     Status: Abnormal   Collection Time: 11/06/22  8:57 AM  Result Value Ref Range   WBC Count 6.7 4.0 - 10.5 K/uL   RBC 3.83 (L) 3.87 - 5.11 MIL/uL   Hemoglobin 9.7 (L) 12.0 - 15.0 g/dL   HCT 62.1 (L) 30.8 - 65.7 %   MCV 80.7 80.0 - 100.0 fL   MCH 25.3 (L) 26.0 - 34.0 pg   MCHC 31.4 30.0 - 36.0 g/dL   RDW 84.6 (H) 96.2 - 95.2 %   Platelet Count 218 150 - 400 K/uL   nRBC 0.0 0.0 - 0.2 %   Neutrophils Relative % 70 %   Neutro Abs 4.7 1.7 - 7.7 K/uL   Lymphocytes Relative 20 %   Lymphs Abs 1.3 0.7 - 4.0 K/uL   Monocytes Relative 6 %   Monocytes Absolute 0.4 0.1 - 1.0 K/uL   Eosinophils Relative 3 %   Eosinophils Absolute 0.2 0.0 - 0.5 K/uL   Basophils Relative 1 %   Basophils Absolute 0.1 0.0 - 0.1 K/uL   Immature Granulocytes 0 %   Abs Immature Granulocytes 0.02 0.00 - 0.07 K/uL    Comment: Performed at [facility], [address], [place], [place] [address]  ECHOCARDIOGRAM COMPLETE     Status: None   Collection Time: 11/10/22  3:40 PM  Result Value Ref  Range   S' Lateral 3.20 cm   Area-P 1/2 3.72 cm2   AR max vel 2.02 cm2   AV Peak grad 23.8 mmHg   Ao pk vel 2.44 m/s   Est EF 60 - 65%   CBC with Differential (Cancer Center Only)     Status: Abnormal   Collection Time: 11/13/22  2:26 PM  Result Value Ref Range   WBC Count 6.5 4.0 - 10.5 K/uL   RBC 3.96 3.87 - 5.11 MIL/uL   Hemoglobin 10.2 (L) 12.0 - 15.0 g/dL   HCT 44.0 (L) 10.2 - 72.5 %   MCV 79.5 (L) 80.0 - 100.0 fL  MCH 25.8 (L) 26.0 - 34.0 pg   MCHC 32.4 30.0 - 36.0 g/dL   RDW 34.7 (H) 42.5 - 95.6 %   Platelet Count 235 150 - 400 K/uL   nRBC 0.0 0.0 - 0.2 %   Neutrophils Relative % 57 %   Neutro Abs 3.8 1.7 - 7.7 K/uL   Lymphocytes Relative 31 %   Lymphs Abs 2.0 0.7 - 4.0 K/uL   Monocytes Relative 8 %   Monocytes Absolute 0.5 0.1 - 1.0 K/uL   Eosinophils Relative 3 %   Eosinophils Absolute 0.2 0.0 - 0.5 K/uL   Basophils Relative 1 %   Basophils Absolute 0.1 0.0 - 0.1 K/uL   Immature Granulocytes 0 %   Abs Immature Granulocytes 0.01 0.00 - 0.07 K/uL    Comment: Performed at [facility], [address] [place], [place] [address]  CMP (Cancer Center only)     Status: Abnormal   Collection Time: 11/13/22  2:26 PM  Result Value Ref Range   Sodium 139 135 - 145 mmol/L   Potassium 3.9 3.5 - 5.1 mmol/L   Chloride 104 98 - 111 mmol/L   CO2 28 22 - 32 mmol/L   Glucose, Bld 103 (H) 70 - 99 mg/dL    Comment: Glucose reference range applies only to samples taken after fasting for at least 8 hours.   BUN 18 8 - 23 mg/dL   Creatinine 4.33 2.95 - 1.00 mg/dL   Calcium 9.3 8.9 - 18.8 mg/dL   Total Protein 7.1 6.5 - 8.1 g/dL   Albumin 3.9 3.5 - 5.0 g/dL   AST 23 15 - 41 U/L   ALT 13 0 - 44 U/L   Alkaline Phosphatase 59 38 - 126 U/L   Total Bilirubin 0.4 0.3 - 1.2 mg/dL   GFR, Estimated >41 >66 mL/min    Comment: (NOTE) Calculated using the CKD-EPI Creatinine Equation (2021)    Anion gap 7 5 - 15    Comment: Performed at [facility], [address], [place], [place] [address]  CMP (Cancer Center only)     Status: Abnormal   Collection Time: 12/05/22  2:07 PM  Result Value Ref Range   Sodium 139 135 - 145 mmol/L   Potassium 3.8 3.5 - 5.1 mmol/L   Chloride 103 98 - 111 mmol/L   CO2 30 22 - 32 mmol/L   Glucose, Bld 120 (H) 70 - 99 mg/dL    Comment: Glucose reference range applies only to samples taken after fasting for at least 8 hours.   BUN 19 8 - 23 mg/dL   Creatinine 6.01 0.93 - 1.00 mg/dL   Calcium 9.6 8.9 - 23.5 mg/dL   Total Protein 7.2 6.5 - 8.1 g/dL   Albumin 3.9 3.5 - 5.0 g/dL   AST 25 15 - 41 U/L   ALT 15 0 - 44 U/L   Alkaline Phosphatase 61 38 - 126 U/L   Total Bilirubin 0.4 0.3 - 1.2 mg/dL   GFR, Estimated >57 >32 mL/min    Comment: (NOTE) Calculated using the CKD-EPI Creatinine Equation (2021)    Anion gap 6 5 - 15    Comment: Performed at [facility], [address], [place], [place] [address]  CBC with Differential (Cancer Center Only)     Status: Abnormal   Collection Time: 12/05/22  2:07 PM  Result Value Ref Range   WBC Count 6.6 4.0 - 10.5 K/uL  RBC 4.11 3.87 - 5.11 MIL/uL   Hemoglobin 10.4 (L) 12.0 - 15.0 g/dL    Comment: Reticulocyte Hemoglobin testing may be clinically indicated, consider ordering this additional test [id number]    HCT 32.1 (L) 36.0 - 46.0 %   MCV 78.1 (L) 80.0 - 100.0 fL   MCH 25.3 (L) 26.0 - 34.0 pg   MCHC 32.4 30.0 - 36.0 g/dL   RDW 54.0 (H) 98.1 - 19.1 %   Platelet Count 179 150 - 400 K/uL   nRBC 0.0 0.0 - 0.2 %   Neutrophils Relative % 61 %   Neutro Abs 4.0 1.7 - 7.7 K/uL   Lymphocytes Relative 29 %   Lymphs Abs 1.9 0.7 - 4.0 K/uL   Monocytes Relative 6 %   Monocytes Absolute 0.4 0.1 - 1.0 K/uL   Eosinophils Relative 3 %   Eosinophils Absolute 0.2 0.0 - 0.5 K/uL   Basophils Relative 1 %   Basophils Absolute 0.1 0.0 - 0.1 K/uL   Immature Granulocytes 0 %   Abs Immature Granulocytes 0.01 0.00 - 0.07 K/uL    Comment:  Performed at [facility], [address], [place], [place] [address]  CMP (Cancer Center only)     Status: Abnormal   Collection Time: 12/25/22 11:37 AM  Result Value Ref Range   Sodium 139 135 - 145 mmol/L   Potassium 3.7 3.5 - 5.1 mmol/L   Chloride 104 98 - 111 mmol/L   CO2 28 22 - 32 mmol/L   Glucose, Bld 176 (H) 70 - 99 mg/dL    Comment: Glucose reference range applies only to samples taken after fasting for at least 8 hours.   BUN 17 8 - 23 mg/dL   Creatinine 5.62 1.30 - 1.00 mg/dL   Calcium 9.4 8.9 - 86.5 mg/dL   Total Protein 7.2 6.5 - 8.1 g/dL   Albumin 3.8 3.5 - 5.0 g/dL   AST 26 15 - 41 U/L   ALT 14 0 - 44 U/L   Alkaline Phosphatase 59 38 - 126 U/L   Total Bilirubin 0.5 0.3 - 1.2 mg/dL   GFR, Estimated >78 >46 mL/min    Comment: (NOTE) Calculated using the CKD-EPI Creatinine Equation (2021)    Anion gap 7 5 - 15    Comment: Performed at [facility], [address], [place], [place] [address]  CBC with Differential (Cancer Center Only)     Status: Abnormal   Collection Time: 12/25/22 11:37 AM  Result Value Ref Range   WBC Count 6.0 4.0 - 10.5 K/uL   RBC 4.47 3.87 - 5.11 MIL/uL   Hemoglobin 11.1 (L) 12.0 - 15.0 g/dL   HCT 28.4 (L) 13.2 - 44.0 %   MCV 77.2 (L) 80.0 - 100.0 fL   MCH 24.8 (L) 26.0 - 34.0 pg   MCHC 32.2 30.0 - 36.0 g/dL   RDW 10.2 (H) 72.5 - 36.6 %   Platelet Count 171 150 - 400 K/uL   nRBC 0.0 0.0 - 0.2 %   Neutrophils Relative % 61 %   Neutro Abs 3.7 1.7 - 7.7 K/uL   Lymphocytes Relative 28 %   Lymphs Abs 1.7 0.7 - 4.0 K/uL   Monocytes Relative 7 %   Monocytes Absolute 0.4 0.1 - 1.0 K/uL   Eosinophils Relative 3 %   Eosinophils Absolute 0.2 0.0 - 0.5 K/uL   Basophils Relative 1 %   Basophils Absolute 0.0 0.0 - 0.1 K/uL  Immature Granulocytes 0 %   Abs Immature Granulocytes 0.02 0.00 - 0.07 K/uL    Comment: Performed at [facility], [address], [place],  [place] [address]  Urinalysis, Routine w reflex microscopic -Urine, Clean Catch     Status: None   Collection Time: 01/03/23 12:13 PM  Result Value Ref Range   Color, Urine YELLOW YELLOW   APPearance CLEAR CLEAR   Specific Gravity, Urine 1.009 1.005 - 1.030   pH 7.5 5.0 - 8.0   Glucose, UA NEGATIVE NEGATIVE mg/dL   Hgb urine dipstick NEGATIVE NEGATIVE   Bilirubin Urine NEGATIVE NEGATIVE   Ketones, ur NEGATIVE NEGATIVE mg/dL   Protein, ur NEGATIVE NEGATIVE mg/dL   Nitrite NEGATIVE NEGATIVE   Leukocytes,Ua NEGATIVE NEGATIVE    Comment: Performed at [facility], [address], [place], [place] [address]  CBC with Differential     Status: Abnormal   Collection Time: 01/05/23 11:27 AM  Result Value Ref Range   WBC 5.8 4.0 - 10.5 K/uL   RBC 4.57 3.87 - 5.11 MIL/uL   Hemoglobin 11.0 (L) 12.0 - 15.0 g/dL   HCT 25.9 (L) 56.3 - 87.5 %   MCV 75.9 (L) 80.0 - 100.0 fL   MCH 24.1 (L) 26.0 - 34.0 pg   MCHC 31.7 30.0 - 36.0 g/dL   RDW 64.3 (H) 32.9 - 51.8 %   Platelets 146 (L) 150 - 400 K/uL   nRBC 0.0 0.0 - 0.2 %   Neutrophils Relative % 68 %   Neutro Abs 4.0 1.7 - 7.7 K/uL   Lymphocytes Relative 23 %   Lymphs Abs 1.3 0.7 - 4.0 K/uL   Monocytes Relative 7 %   Monocytes Absolute 0.4 0.1 - 1.0 K/uL   Eosinophils Relative 2 %   Eosinophils Absolute 0.1 0.0 - 0.5 K/uL   Basophils Relative 0 %   Basophils Absolute 0.0 0.0 - 0.1 K/uL   Immature Granulocytes 0 %   Abs Immature Granulocytes 0.01 0.00 - 0.07 K/uL    Comment: Performed at [facility], [address] [place], [place] [address]  Comprehensive metabolic panel     Status: Abnormal   Collection Time: 01/05/23 11:27 AM  Result Value Ref Range   Sodium 138 135 - 145 mmol/L   Potassium 3.6 3.5 - 5.1 mmol/L   Chloride 102 98 - 111 mmol/L   CO2 26 22 - 32 mmol/L   Glucose, Bld 139 (H) 70 - 99 mg/dL    Comment: Glucose reference range applies only to samples taken after fasting for at least 8 hours.    BUN 25 (H) 8 - 23 mg/dL   Creatinine, Ser 0.63 0.44 - 1.00 mg/dL   Calcium 9.5 8.9 - 01.6 mg/dL   Total Protein 7.2 6.5 - 8.1 g/dL   Albumin 3.9 3.5 - 5.0 g/dL   AST 28 15 - 41 U/L   ALT 18 0 - 44 U/L   Alkaline Phosphatase 57 38 - 126 U/L   Total Bilirubin 0.5 0.3 - 1.2 mg/dL   GFR, Estimated >01 >09 mL/min    Comment: (NOTE) Calculated using the CKD-EPI Creatinine Equation (2021)    Anion gap 10 5 - 15    Comment: Performed at [facility], [address] [place], [place] [address]  Occult blood card to lab, stool     Status: None   Collection Time: 01/05/23  2:08 PM  Result Value Ref Range   Fecal Occult Bld NEGATIVE  NEGATIVE    Comment: Performed at [facility], [address], [place], [place] [address]   EKG: No EKG on admission so we will get 1 now   Assessment and Plan: No notes have been filed under this hospital service. Service: Hospitalist  Bright red blood per rectum -Likely lower diverticular bleed -Place in observation telemetry and continue to monitor hemoglobin/hematocrit -Supportive care and clear liquid diet for now and GI is consulting in the a.m. -IV PPI 40 mg daily initiated -Continue IV fluid hydration and monitor blood counts and monitor H&H every 6 -Hgb/Hct Trend: Recent Labs  Lab 12/25/22 1137 01/05/23 1127 01/05/23 1815  HGB 11.1* 11.0* 10.6*  HCT 34.5* 34.7* 34.5*  MCV 77.2* 75.9*  --   -Check anemia panel in a.m. -Transfuse for hemoglobin less than 7 or symptomatic -Further care per GI   Thrombocytopenia -Mild.  Platelet count trend: Recent Labs  Lab 12/25/22 1137 01/05/23 1127  PLT 171 146*  -Patient had 2 bloody bowel movements earlier but has not had any will need to monitor for further bloody bowel movements. -Repeat CBC in a.m.  Diabetes mellitus type 2 -Place on sensitive NovoLog/scale insulin before meals and at bedtime if necessary  Hypertension -Hold antihypertensives for  now given concern for lower GI bleeding -Continue monitor blood pressures per protocol  History of PE -No longer anticoagulated given history of GI bleeding previously  Generalized weakness -Obtain PT OT -IV fluids as above   Advance Care Planning:   Code Status: Prior FULL  Consults: Gastroenterology  Family Communication: No family present at bedside   Severity of Illness: The appropriate patient status for this patient is OBSERVATION. Observation status is judged to be reasonable and necessary in order to provide the required intensity of service to ensure the patient's safety. The patient's presenting symptoms, physical exam findings, and initial radiographic and laboratory data in the context of their medical condition is felt to place them at decreased risk for further clinical deterioration. Furthermore, it is anticipated that the patient will be medically stable for discharge from the hospital within 2 midnights of admission.   Author: Marguerita Merles, DO [facility] 01/05/2023 5:31 PM  For on call review [email or web address].

## 2023-01-05 NOTE — ED Triage Notes (Signed)
C/O rectal bleeding, stated x2 BM today that has blood in it. Denies hemorrhoids, no blood thinners medications. States she recently used a laxative, that she does on a regular basis.

## 2023-01-05 NOTE — ED Provider Notes (Signed)
[place] EMERGENCY DEPARTMENT AT [facility] Provider Note   CSN: [id number] Arrival date & time: 01/05/23  1115     History  Chief Complaint  Patient presents with   Rectal Bleeding    Tami Schmidt is a 89 y.o. female.  Patient presenting today for 2 bright red blood bowel movements that occurred this morning.  Patient states that and back in 2019 she had something similar.  Was seen by [facility] gastroenterology and had a colonoscopy done at that time.  Patient is also followed by [facility] by Dr. Al Pimple last seen by them September 12.  She is followed by them for triple positive breast cancer recurrence in her left axilla subpectoral and subclavicular lymph nodes.  She is on second line Kadcyla.  His left upper extremity lymphedema was improving at that visit.  Patient denies any abdominal pain.  Denies feeling like she is going to pass out.  Patient did do laxatives because she felt as if she was constipated.  Patient's abdomen is soft and nontender no abdominal pain.  Patient did not do any instrumentation of the anal or rectal area.  Therefore she did not do any enemas.  The laxatives did help her have a bowel movement.  But she had 2 separate bloody bowel movements.  Patient is not on blood thinners.  Patient's vital signs here are stable.  No fevers blood pressure 05/07/1946 heart rate 78.       Home Medications Prior to Admission medications   Medication Sig Start Date End Date Taking? Authorizing Provider  amLODipine (NORVASC) 10 MG tablet Take 10 mg by mouth daily.    [provider]  lidocaine (LIDODERM) 5 % Place 1 patch onto the skin daily. Remove & Discard patch within 12 hours or as directed by MD 01/03/23   Faith Rogue, DO  metFORMIN (GLUCOPHAGE) 500 MG tablet Take 500 mg by mouth daily with breakfast.    [provider]  olmesartan-hydrochlorothiazide (BENICAR HCT) 40-12.5 MG tablet Take 1 tablet by mouth daily.    [provider]  oxyCODONE (ROXICODONE) 5 MG immediate release tablet Take 1 tablet (5 mg total) by mouth every 6 (six) hours as needed for severe pain or breakthrough pain. Do not drive while using his medication.  This medication may make you feel tired. 01/03/23   Faith Rogue, DO  SM IRON 325 (65 Fe) MG tablet Take 325 mg by mouth daily. 07/22/19   [provider]  Turmeric (QC TUMERIC COMPLEX PO) Take 1 tablet by mouth daily. Patient not taking: Reported on 12/05/2022    [provider]  TYLENOL 325 MG tablet Take 325 mg by mouth daily. 10/26/20   [provider]  VITAMIN D PO Take 1 tablet by mouth daily.    [provider]      Allergies    Bactrim, Lisinopril, Vasotec, and Sulfamethoxazole-trimethoprim    Review of Systems   Review of Systems  Constitutional:  Negative for chills and fever.  HENT:  Negative for ear pain and sore throat.   Eyes:  Negative for pain and visual disturbance.  Respiratory:  Negative for cough and shortness of breath.   Cardiovascular:  Negative for chest pain and palpitations.  Gastrointestinal:  Positive for blood in stool and constipation. Negative for abdominal pain and vomiting.  Genitourinary:  Negative for dysuria and hematuria.  Musculoskeletal:  Negative for arthralgias and back pain.  Skin:  Negative for color change and rash.  Neurological:  Negative for seizures and syncope.  All other systems reviewed and are negative.   Physical Exam Updated Vital Signs BP 130/64   Pulse 80   Temp 97.9 F (36.6 C) (Oral)   Resp 16   Ht 1.702 m (5\' 7" )   Wt 81.6 kg   SpO2 99%   BMI 28.19 kg/m  Physical Exam Vitals and nursing note reviewed.  Constitutional:      General: She is not in acute distress.    Appearance: She is well-developed. She is not ill-appearing.  HENT:     Head: Normocephalic and atraumatic.     Mouth/Throat:     Mouth: Mucous membranes are moist.  Eyes:     Conjunctiva/sclera:  Conjunctivae normal.  Cardiovascular:     Rate and Rhythm: Normal rate and regular rhythm.     Heart sounds: No murmur heard. Pulmonary:     Effort: Pulmonary effort is normal. No respiratory distress.     Breath sounds: Normal breath sounds.     Comments: Port-A-Cath right anterior chest. Abdominal:     General: There is no distension.     Palpations: Abdomen is soft.     Tenderness: There is no abdominal tenderness. There is no guarding.  Genitourinary:    Rectum: Normal.     Comments: Perianal area normal.  No prolapsed internal hemorrhoids no external hemorrhoids no anal fissure.  Rectal exam with minimal stool in the vault certainly no impaction.  Stool was brown in color.  Hemoccult was negative.  But patient is adamant about her history of bright red blood per rectum. Musculoskeletal:        General: No swelling.     Cervical back: Neck supple.  Skin:    General: Skin is warm and dry.     Capillary Refill: Capillary refill takes less than 2 seconds.  Neurological:     General: No focal deficit present.     Mental Status: She is alert and oriented to person, place, and time.  Psychiatric:        Mood and Affect: Mood normal.     ED Results / Procedures / Treatments   Labs (all labs ordered are listed, but only abnormal results are displayed) Labs Reviewed  CBC WITH DIFFERENTIAL/PLATELET - Abnormal; Notable for the following components:      Result Value   Hemoglobin 11.0 (*)    HCT 34.7 (*)    MCV 75.9 (*)    MCH 24.1 (*)    RDW 17.3 (*)    Platelets 146 (*)    All other components within normal limits  COMPREHENSIVE METABOLIC PANEL - Abnormal; Notable for the following components:   Glucose, Bld 139 (*)    BUN 25 (*)    All other components within normal limits  OCCULT BLOOD X 1 CARD TO LAB, STOOL  POC OCCULT BLOOD, ED    EKG None  Radiology No results found.  Procedures Procedures    Medications Ordered in ED Medications  0.9 %  sodium chloride  infusion ( Intravenous New Bag/Given 01/05/23 1335)    ED Course/ Medical Decision Making/ A&P                                 Medical Decision Making Amount and/or Complexity of Data Reviewed Labs: ordered.  Risk Prescription drug management. Decision regarding hospitalization.   Patient with rectal exam here without any significant stool  or mass in the rectal vault.  Hemoccult was negative.  The patient is adamant about the significant amount of red blood per rectum x 2 earlier today.  I feel based on this that she should be observed.  Hemoglobin today is 11.  Patient is followed by [facility] for metastatic localized breast cancer.  Did message her oncologist.  Also have a call out to [facility].  [facility] saw her in 2019 when she had some colonic bleeding and they did a colonoscopy at that time.  Will contact hospitalist for admission. Final Clinical Impression(s) / ED Diagnoses Final diagnoses:  Gastrointestinal hemorrhage, unspecified gastrointestinal hemorrhage type    Rx / DC Orders ED Discharge Orders     None         Vanetta Mulders, MD 01/05/23 1422

## 2023-01-05 NOTE — ED Notes (Signed)
Called [facility] for transport 

## 2023-01-05 NOTE — Hospital Course (Addendum)
Tami Schmidt is a 89 y.o. 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  States that she was feeling constipated and took a laxative yesterday and after she took a laxative she started having bright red blood per rectum and states that the blood was intermixed with her stool.  Denied any nausea, vomiting, chest pain, shortness breath, lightheadedness or dizziness or burning or discomfort in her urine.  States that she is being treated for her cancer and had chemotherapy last week.  States that this has happened to her before prior.  Patient also states that by the time she came to the ED that the blood had stopped and she has had 2 bloody episodes.  FOBT was negative on occult examination.  Given her symptoms and her concern TRH was asked to admit this patient for observation of her lower GI bleeding and gastroenterology has been consulted and recommending observation overnight with clear liquid diet for now and placed on IV PPI.    **Interm History Patient's blood count had dropped a little bit and she had 2 more bloody episodes overnight and describes them as dark red stool but no stools were documented by the nursing staff.  Hemoglobin has dropped to 8.9 today and GI has evaluated and feels that the bleeding may have subsided given that she has not had any episodes today.  They are recommending advancing diet and managing conservatively with no plans for updated colonoscopy at this time.  They are recommending to monitor H&H closely and go to soft diet tonight if she tolerates and anticipating discharging in the next 24 to 48 hours.  Assessment and Plan:  Bright red blood per rectum and painless hematochezia  likely secondary to diverticular bleed Acute blood loss anemia likely dilutional drop in addition to the GI bleeding -Likely lower diverticular bleed -Place in observation telemetry and continue to monitor hemoglobin/hematocrit -Supportive care and clear liquid diet for now and GI is consulting in the a.m. -IV PPI 40 mg daily initiated per GI recommendations -Continue IV fluid hydration with NS at 75 mL/hr for 12 more hours -Continue to monitor blood counts and monitor H&H every 6 and will continue to now -Hgb/Hct Trend and has dropped but is on IV fluids: Recent Labs  Lab 12/25/22 1137 01/05/23 1127 01/05/23 1815 01/06/23 0559 01/06/23 1111  HGB 11.1* 11.0* 10.6* 8.8* 8.9*  HCT 34.5* 34.7* 34.5* 29.0* 29.2*  MCV 77.2* 75.9*  --  79.5*  --   -Checked a panel and showed an iron level of 30, UIBC of 277, TIBC of 315, saturation ratios of 12%, ferritin level 79, folate level 20.3, vitamin B12 level 641 -Transfuse for hemoglobin less than 7 or symptomatic -Further care per GI and they have evaluated and recommending managing conservatively and feel that her bleeding may have resolved given despite that she had 2 more bloody bowel movements yesterday evening.  They have no plans for updating her colonoscopy at this time and recommending advancing her diet to full's today and soft this evening.  If she tolerates her diet and has no further bleeding and hemoglobin stable they are recommending that she is stable for discharge tomorrow  Hypokalemia -Patient's K+ Level Trend: Recent Labs  Lab 12/25/22 1137 01/05/23 1127 01/06/23  0559  K 3.7 3.6 3.3*  -Replete with po Kcl 40 mEQ BID x2 -Mag Level was 1.8 and replete with IV Mag Sulfate 2 grams -Continue to Monitor and Replete as Necessary -Repeat CMP in the AM   Thrombocytopenia -Mild.  Platelet count trend: Recent Labs  Lab 12/25/22 1137 01/05/23 1127 01/06/23 0559  PLT 171 146* 127*  -Patient had 2 bloody bowel movements earlier  PTA and 2 last evening but has not had any will need to monitor for further bloody bowel movements. -Repeat CBC in a.m and her thrombocytopenia could also be in the setting of her breast cancer treatment   Diabetes Mellitus Type 2 -Place on sensitive NovoLog/scale insulin before meals and at bedtime if necessary -CBG and Glucose Trend: Recent Labs  Lab 01/06/23 0815  GLUCAP 85   Recent Labs  Lab 12/25/22 1137 01/05/23 1127 01/06/23 0559  GLUCOSE 176* 139* 88   Hypertension -Hold Home antihypertensives (Olmesartan-hydrochlorothiazide 40-12.5 mg po Daily and Amlodipine 10 mg po Daily) for now given concern for lower GI bleeding -Continue monitor blood pressures per protocol -Last BP reading was 128/60   History of PE -No longer anticoagulated given history of GI bleeding previously   Generalized Weakness -Obtain PT/OT and OT recommending No follow up -IV fluids as above  Back Pain -C/w lidocaine patch 1 patch transdermally every 12 hours  -Continue with pain control acetaminophen 650 mg p.o./RC every 6 as needed and oxycodone 5 mg p.o. every 6 as needed severe pain and breakthrough pain  Recurrent Triple Positive Breast Cancer  -She is a malignant neoplasm of lower inner quadrant of the left breast and is estrogen receptor positive -She is post status lumpectomy as well as right mastectomy and left mastectomy -She is status post 5 years of tamoxifen -She is on second line Kadcyla and tolerating well and sees oncology in outpatient setting and will be seen within a few weeks given that she sees the oncologist every 3 weeks with repeat labs and Cycle  Hypoalbuminemia -Patient's Albumin Trend: Recent Labs  Lab 12/25/22 1137 01/05/23 1127 01/06/23 0559  ALBUMIN 3.8 3.9 3.0*  -Continue to Monitor and Trend and repeat CMP in the AM

## 2023-01-06 DIAGNOSIS — E876 Hypokalemia: Secondary | ICD-10-CM | POA: Diagnosis not present

## 2023-01-06 DIAGNOSIS — K625 Hemorrhage of anus and rectum: Secondary | ICD-10-CM | POA: Diagnosis not present

## 2023-01-06 DIAGNOSIS — D62 Acute posthemorrhagic anemia: Secondary | ICD-10-CM | POA: Diagnosis not present

## 2023-01-06 LAB — COMPREHENSIVE METABOLIC PANEL
ALT: 20 U/L (ref 0–44)
AST: 33 U/L (ref 15–41)
Albumin: 3 g/dL — ABNORMAL LOW (ref 3.5–5.0)
Alkaline Phosphatase: 45 U/L (ref 38–126)
Anion gap: 9 (ref 5–15)
BUN: 24 mg/dL — ABNORMAL HIGH (ref 8–23)
CO2: 23 mmol/L (ref 22–32)
Calcium: 8.5 mg/dL — ABNORMAL LOW (ref 8.9–10.3)
Chloride: 109 mmol/L (ref 98–111)
Creatinine, Ser: 0.66 mg/dL (ref 0.44–1.00)
GFR, Estimated: >60 mL/min (ref >60)
Glucose, Bld: 88 mg/dL (ref 70–99)
Potassium: 3.3 mmol/L — ABNORMAL LOW (ref 3.5–5.1)
Sodium: 141 mmol/L (ref 135–145)
Total Bilirubin: 0.8 mg/dL (ref 0.3–1.2)
Total Protein: 5.9 g/dL — ABNORMAL LOW (ref 6.5–8.1)

## 2023-01-06 LAB — CBC WITH DIFFERENTIAL/PLATELET
Abs Immature Granulocytes: 0.03 10*3/uL (ref 0.00–0.07)
Basophils Absolute: 0 10*3/uL (ref 0.0–0.1)
Basophils Relative: 1 %
Eosinophils Absolute: 0.1 10*3/uL (ref 0.0–0.5)
Eosinophils Relative: 2 %
HCT: 29 % — ABNORMAL LOW (ref 36.0–46.0)
Hemoglobin: 8.8 g/dL — ABNORMAL LOW (ref 12.0–15.0)
Immature Granulocytes: 1 %
Lymphocytes Relative: 28 %
Lymphs Abs: 1.7 10*3/uL (ref 0.7–4.0)
MCH: 24.1 pg — ABNORMAL LOW (ref 26.0–34.0)
MCHC: 30.3 g/dL (ref 30.0–36.0)
MCV: 79.5 fL — ABNORMAL LOW (ref 80.0–100.0)
Monocytes Absolute: 0.5 10*3/uL (ref 0.1–1.0)
Monocytes Relative: 8 %
Neutro Abs: 3.7 10*3/uL (ref 1.7–7.7)
Neutrophils Relative %: 60 %
Platelets: 127 10*3/uL — ABNORMAL LOW (ref 150–400)
RBC: 3.65 MIL/uL — ABNORMAL LOW (ref 3.87–5.11)
RDW: 17.3 % — ABNORMAL HIGH (ref 11.5–15.5)
WBC: 6.1 10*3/uL (ref 4.0–10.5)
nRBC: 0 % (ref 0.0–0.2)

## 2023-01-06 LAB — RETICULOCYTES
Immature Retic Fract: 10.4 % (ref 2.3–15.9)
RBC.: 3.62 MIL/uL — ABNORMAL LOW (ref 3.87–5.11)
Retic Count, Absolute: 31.5 10*3/uL (ref 19.0–186.0)
Retic Ct Pct: 0.9 % (ref 0.4–3.1)

## 2023-01-06 LAB — HEMOGLOBIN AND HEMATOCRIT, BLOOD
HCT: 29.2 % — ABNORMAL LOW (ref 36.0–46.0)
HCT: 28.5 % — ABNORMAL LOW (ref 36.0–46.0)
Hemoglobin: 8.9 g/dL — ABNORMAL LOW (ref 12.0–15.0)
Hemoglobin: 8.7 g/dL — ABNORMAL LOW (ref 12.0–15.0)

## 2023-01-06 LAB — IRON AND TIBC
Iron: 38 ug/dL (ref 28–170)
Saturation Ratios: 12 % (ref 10.4–31.8)
TIBC: 315 ug/dL (ref 250–450)
UIBC: 277 ug/dL

## 2023-01-06 LAB — PHOSPHORUS: Phosphorus: 3.2 mg/dL (ref 2.5–4.6)

## 2023-01-06 LAB — FERRITIN: Ferritin: 79 ng/mL (ref 11–307)

## 2023-01-06 LAB — VITAMIN B12: Vitamin B-12: 641 pg/mL (ref 180–914)

## 2023-01-06 LAB — GLUCOSE, CAPILLARY: Glucose-Capillary: 85 mg/dL (ref 70–99)

## 2023-01-06 LAB — MAGNESIUM: Magnesium: 1.8 mg/dL (ref 1.7–2.4)

## 2023-01-06 LAB — FOLATE: Folate: 28.3 ng/mL (ref >5.9)

## 2023-01-06 MED ORDER — SODIUM CHLORIDE 0.9 % IV SOLN: INTRAVENOUS | Status: AC

## 2023-01-06 MED ORDER — PNEUMOCOCCAL 20-VAL CONJ VACC 0.5 ML IM SUSY
0.5000 mL | PREFILLED_SYRINGE | INTRAMUSCULAR | Status: AC
  Administered 2023-01-07: 0.5 mL via INTRAMUSCULAR
  Filled 2023-01-06: qty 0.5

## 2023-01-06 MED ORDER — MAGNESIUM SULFATE 2 GM/50ML IV SOLN
2.0000 g | Freq: Once | INTRAVENOUS | Status: AC
  Administered 2023-01-06: 2 g via INTRAVENOUS
  Filled 2023-01-06: qty 50

## 2023-01-06 MED ORDER — INFLUENZA VAC A&B SURF ANT ADJ 0.5 ML IM SUSY
0.5000 mL | PREFILLED_SYRINGE | INTRAMUSCULAR | Status: AC
  Administered 2023-01-07: 0.5 mL via INTRAMUSCULAR
  Filled 2023-01-06: qty 0.5

## 2023-01-06 MED ORDER — POTASSIUM CHLORIDE CRYS ER 20 MEQ PO TBCR
40.0000 meq | EXTENDED_RELEASE_TABLET | Freq: Two times a day (BID) | ORAL | Status: AC
  Administered 2023-01-06 (×2): 40 meq via ORAL
  Filled 2023-01-06 (×2): qty 2

## 2023-01-06 MED ORDER — PANTOPRAZOLE SODIUM 40 MG PO TBEC
40.0000 mg | DELAYED_RELEASE_TABLET | Freq: Every day | ORAL | Status: DC
  Administered 2023-01-06 – 2023-01-08 (×3): 40 mg via ORAL
  Filled 2023-01-06 (×3): qty 1

## 2023-01-06 NOTE — Evaluation (Signed)
Occupational Therapy Evaluation Patient Details Name: Tami Schmidt MRN: [id number] DOB: 12-20-1932 Today's Date: 01/06/2023   History of Present Illness Patient is a 89 year old female who presented on 9/23 with left side low back pain with two stools with bright red blood. Patient was admitted with thrombocytopenia and likely lower GI bleed. PMH: DM II, PE, HTN, breast cancer bilateral mastectomies with carcinoma metastatic to lymph node, arthritis.   Clinical Impression   Patient evaluated by Occupational Therapy with no further acute OT needs identified. All education has been completed and the patient has no further questions. Patient reported that she does not need acute OT services at this time as patient is able to complete ADLs without physical assistance. Patient educated on ECT Schmidt below for any follow-up Occupational Therapy or equipment needs. OT is signing off. Thank you for this referral.        If plan is discharge home, recommend the following: A little help with walking and/or transfers;Assistance with cooking/housework;Assist for transportation;Help with stairs or ramp for entrance    Functional Status Assessment  Patient has had a recent decline in their functional status and demonstrates the ability to make significant improvements in function in a reasonable and predictable amount of time.  Equipment Recommendations  None recommended by OT       Precautions / Restrictions Restrictions Weight Bearing Restrictions: No      Mobility Bed Mobility               General bed mobility comments: patient was sitting EOB at start of session and returned to the same at end of session.        Balance Overall balance assessment: Mild deficits observed, not formally tested       ADL either performed or assessed with clinical judgement   ADL Overall ADL's : Needs assistance/impaired Eating/Feeding: Modified independent;Sitting   Grooming: Sitting;Modified  independent   Upper Body Bathing: Sitting;Supervision/ safety   Lower Body Bathing: Supervison/ safety;Set up;Sitting/lateral leans;Sit to/from stand   Upper Body Dressing : Set up;Sitting   Lower Body Dressing: Supervision/safety;Set up;Sitting/lateral leans;Sit to/from stand Lower Body Dressing Details (indicate cue type and reason): patient able to complete simulated LB Dressing tasks with figure four positioning bilaterally sitting EOB. patient did report pain in L lower back but able to don/doff socks and maintain balance for simulated clothing managment tasks with supervision. patient reported feeling unsteady with recent use of oxy. patient reported that she was not going to take it again it made her feel " lethargic" Toilet Transfer: Contact guard assist;Ambulation Toilet Transfer Details (indicate cue type and reason): with use of IV pole as patient reported typically using cane in community to get around. noted to have unsteadiness with movement with patient reporting it was secondary to oxy. no :LOB Toileting- Clothing Manipulation and Hygiene: Set up;Supervision/safety;Sitting/lateral lean;Sit to/from stand               Vision Baseline Vision/History: 1 Wears glasses              Pertinent Vitals/Pain Pain Assessment Pain Assessment: 0-10 Pain Score: 6  Pain Location: left lower back Pain Descriptors / Indicators: Discomfort, Grimacing Pain Intervention(s): Limited activity within patient's tolerance, Monitored during session, Premedicated before session     Extremity/Trunk Assessment Upper Extremity Assessment Upper Extremity Assessment: Right hand dominant;LUE deficits/detail;RUE deficits/detail RUE Deficits / Details: ROM shoulder to about 70 degrees bilaterall with abiltiy to touch top of head. patient reported having arthritis in  shoulders and shoulders being at baseline.   Lower Extremity Assessment Lower Extremity Assessment: Defer to PT evaluation    Cervical / Trunk Assessment Cervical / Trunk Assessment: Kyphotic      Cognition Arousal: Alert Behavior During Therapy: WFL for tasks assessed/performed Overall Cognitive Status: Within Functional Limits for tasks assessed       General Comments: plesant and cooperative                Home Living Family/patient expects to be discharged to:: Private residence Living Arrangements: Spouse/significant other Available Help at Discharge: Family;Available 24 hours/day Type of Home: House Home Access: Level entry     Home Layout: Two level               Home Equipment: Cane - single point          Prior Functioning/Environment Prior Level of Function : Independent/Modified Independent                                 OT Goals(Current goals can be found in the care plan section) Acute Rehab OT Goals OT Goal Formulation: All assessment and education complete, DC therapy  OT Frequency:         AM-PAC OT "6 Clicks" Daily Activity     Outcome Measure Help from another person eating meals?: None Help from another person taking care of personal grooming?: None Help from another person toileting, which includes using toliet, bedpan, or urinal?: A Little Help from another person bathing (including washing, rinsing, drying)?: A Little Help from another person to put on and taking off regular upper body clothing?: None Help from another person to put on and taking off regular lower body clothing?: None 6 Click Score: 22   End of Session Nurse Communication: Other (comment) (ok to participate)  Activity Tolerance: Patient tolerated treatment well Patient left: in bed;with call bell/phone within reach  OT Visit Diagnosis: Unsteadiness on feet (R26.81)                Time: 4098-1191 OT Time Calculation (min): 9 min Charges:  OT General Charges $OT Visit: 1 Visit OT Evaluation $OT Eval Low Complexity: 1 Low  Vanity Larsson OTR/L, MS Acute Rehabilitation  Department Office# [phone]   Selinda Flavin 01/06/2023, 9:19 AM

## 2023-01-06 NOTE — Progress Notes (Signed)
   01/06/23 1357  TOC Brief Assessment  Insurance and Status Reviewed  Patient has primary care physician Yes  Home environment has been reviewed Home w/ spouse  Prior level of function: modified independent  Prior/Current Home Services No current home services  Social Determinants of Health Reivew SDOH reviewed no interventions necessary  Readmission risk has been reviewed Yes  Transition of care needs no transition of care needs at this time

## 2023-01-06 NOTE — Plan of Care (Signed)
Pt had bloody bowel movements at beginning of shift and has had none overnight. Problem: Education: Goal: Knowledge of General Education information will improve Description: Including pain rating scale, medication(s)/side effects and non-pharmacologic comfort measures Outcome: Progressing   Problem: Health Behavior/Discharge Planning: Goal: Ability to manage health-related needs will improve Outcome: Progressing   Problem: Clinical Measurements: Goal: Ability to maintain clinical measurements within normal limits will improve Outcome: Progressing Goal: Will remain free from infection Outcome: Progressing Goal: Diagnostic test results will improve Outcome: Progressing Goal: Respiratory complications will improve Outcome: Progressing Goal: Cardiovascular complication will be avoided Outcome: Progressing   Problem: Activity: Goal: Risk for activity intolerance will decrease Outcome: Progressing   Problem: Nutrition: Goal: Adequate nutrition will be maintained Outcome: Progressing   Problem: Coping: Goal: Level of anxiety will decrease Outcome: Progressing   Problem: Elimination: Goal: Will not experience complications related to bowel motility Outcome: Progressing Goal: Will not experience complications related to urinary retention Outcome: Progressing   Problem: Pain Managment: Goal: General experience of comfort will improve Outcome: Progressing   Problem: Safety: Goal: Ability to remain free from injury will improve Outcome: Progressing   Problem: Skin Integrity: Goal: Risk for impaired skin integrity will decrease Outcome: Progressing

## 2023-01-06 NOTE — Plan of Care (Signed)

## 2023-01-06 NOTE — Care Management Obs Status (Signed)
MEDICARE OBSERVATION STATUS NOTIFICATION   Patient Details  Name: MAGDELENA CAMBRIDGE MRN: [id number] Date of Birth: July 03, 1932   Medicare Observation Status Notification Given:  Yes    Otelia Santee, LCSW 01/06/2023, 1:57 PM

## 2023-01-06 NOTE — Progress Notes (Signed)
PROGRESS NOTE    Tami Schmidt  YSA:[id number] DOB: Apr 22, 1932 DOA: 01/05/2023 PCP: Pcp, No   Brief Narrative:  Tami Schmidt is a 89 y.o. 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  States that she was feeling constipated and took a laxative yesterday and after she took a laxative she started having bright red blood per rectum and states that the blood was intermixed with her stool.  Denied any nausea, vomiting, chest pain, shortness breath, lightheadedness or dizziness or burning or discomfort in her urine.  States that she is being treated for her cancer and had chemotherapy last week.  States that this has happened to her before prior.  Patient also states that by the time she came to the ED that the blood had stopped and she has had 2 bloody episodes.  FOBT was negative on occult examination.  Given her symptoms and her concern TRH was asked to admit this patient for observation of her lower GI bleeding and gastroenterology has been consulted and recommending observation overnight with clear liquid diet for now and placed on IV PPI.    **Interm History Patient's blood count had dropped a little bit and she had 2 more bloody episodes overnight and describes them as dark red stool but no stools were documented by the nursing staff.  Hemoglobin has dropped to 8.9 today and GI has evaluated and feels that the bleeding may have subsided given that she has not had any episodes today.  They are recommending advancing diet and managing conservatively with no plans for updated colonoscopy at this time.  They are recommending to monitor H&H closely and go to soft diet tonight if she tolerates and anticipating  discharging in the next 24 to 48 hours.  Assessment and Plan:  Bright red blood per rectum and painless hematochezia likely secondary to diverticular bleed Acute blood loss anemia likely dilutional drop in addition to the GI bleeding -Likely lower diverticular bleed -Place in observation telemetry and continue to monitor hemoglobin/hematocrit -Supportive care and clear liquid diet for now and GI is consulting in the a.m. -IV PPI 40 mg daily initiated per GI recommendations -Continue IV fluid hydration with NS at 75 mL/hr for 12 more hours -Continue to monitor blood counts and monitor H&H every 6 and will continue to now -Hgb/Hct Trend and has dropped but is on IV fluids: Recent Labs  Lab 12/25/22 1137 01/05/23 1127 01/05/23 1815 01/06/23 0559 01/06/23 1111  HGB 11.1* 11.0* 10.6* 8.8* 8.9*  HCT 34.5* 34.7* 34.5* 29.0* 29.2*  MCV 77.2* 75.9*  --  79.5*  --   -Checked a panel and showed an iron level of 30, UIBC of 277, TIBC of 315, saturation ratios of 12%, ferritin level 79, folate level 20.3, vitamin B12 level 641 -Transfuse for hemoglobin less than 7 or symptomatic -Further care per GI and they have evaluated and recommending managing conservatively and feel that her bleeding may have resolved given despite that she had 2 more bloody bowel movements yesterday evening.  They have no plans for updating her colonoscopy at this time and recommending advancing her diet to full's today and soft this evening.  If she tolerates her diet and has no further bleeding and hemoglobin stable they are recommending  that she is stable for discharge tomorrow  Hypokalemia -Patient's K+ Level Trend: Recent Labs  Lab 12/25/22 1137 01/05/23 1127 01/06/23 0559  K 3.7 3.6 3.3*  -Replete with po Kcl 40 mEQ BID x2 -Mag Level was 1.8 and replete with IV Mag Sulfate 2 grams -Continue to Monitor and Replete as Necessary -Repeat CMP in the AM   Thrombocytopenia -Mild.  Platelet count trend: Recent Labs   Lab 12/25/22 1137 01/05/23 1127 01/06/23 0559  PLT 171 146* 127*  -Patient had 2 bloody bowel movements earlier PTA and 2 last evening but has not had any will need to monitor for further bloody bowel movements. -Repeat CBC in a.m and her thrombocytopenia could also be in the setting of her breast cancer treatment   Diabetes Mellitus Type 2 -Place on sensitive NovoLog/scale insulin before meals and at bedtime if necessary -CBG and Glucose Trend: Recent Labs  Lab 01/06/23 0815  GLUCAP 85   Recent Labs  Lab 12/25/22 1137 01/05/23 1127 01/06/23 0559  GLUCOSE 176* 139* 88   Hypertension -Hold Home antihypertensives (Olmesartan-hydrochlorothiazide 40-12.5 mg po Daily and Amlodipine 10 mg po Daily) for now given concern for lower GI bleeding -Continue monitor blood pressures per protocol -Last BP reading was 128/60   History of PE -No longer anticoagulated given history of GI bleeding previously   Generalized Weakness -Obtain PT/OT and OT recommending No follow up -IV fluids as above  Back Pain -C/w lidocaine patch 1 patch transdermally every 12 hours  -Continue with pain control acetaminophen 650 mg p.o./RC every 6 as needed and oxycodone 5 mg p.o. every 6 as needed severe pain and breakthrough pain  Recurrent Triple Positive Breast Cancer  -She is a malignant neoplasm of lower inner quadrant of the left breast and is estrogen receptor positive -She is post status lumpectomy as well as right mastectomy and left mastectomy -She is status post 5 years of tamoxifen -She is on second line Kadcyla and tolerating well and sees oncology in outpatient setting and will be seen within a few weeks given that she sees the oncologist every 3 weeks with repeat labs and Cycle  Hypoalbuminemia -Patient's Albumin Trend: Recent Labs  Lab 12/25/22 1137 01/05/23 1127 01/06/23 0559  ALBUMIN 3.8 3.9 3.0*  -Continue to Monitor and Trend and repeat CMP in the AM     DVT prophylaxis: SCDs  Start: 01/05/23 1845    Code Status: Full Code Family Communication: No family currently at bedside  Disposition Plan:  Level of care: Telemetry Status is: Observation The patient will require care spanning > 2 midnights and should be moved to inpatient because: GI recommends observing another night and continue to monitor hemoglobin/hematocrits every 6 hours   Consultants:  Gastroenterology  Procedures:  As delineated as above  Antimicrobials:  Anti-infectives (From admission, onward)    None       Subjective: Seen and examined at bedside and still states that she is having some back pain and states it is a 4 or 5 out of 10 in severity.  States that she had no abdominal pain.  States that she had 2 more bloody bowel movements that are dark red stools last evening.  No chest pain or lightheadedness or dizziness.  No other concerns or points this time.  Objective: Vitals:   01/05/23 2108 01/06/23 0206 01/06/23 0500 01/06/23 0559  BP: 132/65 125/61  128/60  Pulse: 79 74  72  Resp: 16 16  16   Temp: 98 F (36.7 C) 98.4  F (36.9 C)  98.2 F (36.8 C)  TempSrc: Oral   Oral  SpO2: 100% 98%  100%  Weight:   79.5 kg   Height:        Intake/Output Summary (Last 24 hours) at 01/06/2023 1217 Last data filed at 01/06/2023 0600 Gross per 24 hour  Intake 30 ml  Output --  Net 30 ml   Filed Weights   01/05/23 1125 01/06/23 0500  Weight: 81.6 kg 79.5 kg   Examination: Physical Exam:  Constitutional: WN/WD overweight elderly African-American female in no acute distress Respiratory: Diminished to auscultation bilaterally, no wheezing, rales, rhonchi or crackles. Normal respiratory effort and patient is not tachypenic. No accessory muscle use.  Unlabored breathing Cardiovascular: RRR, no murmurs / rubs / gallops. S1 and S2 auscultated.  Trace lower extremity edema appreciated Abdomen: Soft, non-tender, distended secondary to body habitus. Bowel sounds positive.  GU:  Deferred. Musculoskeletal: No clubbing / cyanosis of digits/nails. No joint deformity upper and lower extremities.  Skin: No rashes, lesions, ulcers limited skin evaluation. No induration; Warm and dry.  Neurologic: CN 2-12 grossly intact with no focal deficits. Romberg sign and cerebellar reflexes not assessed.  Psychiatric: Normal judgment and insight. Alert and oriented x 3. Normal mood and appropriate affect.   Data Reviewed: I have personally reviewed following labs and imaging studies  CBC: Recent Labs  Lab 01/05/23 1127 01/05/23 1815 01/06/23 0559 01/06/23 1111  WBC 5.8  --  6.1  --   NEUTROABS 4.0  --  3.7  --   HGB 11.0* 10.6* 8.8* 8.9*  HCT 34.7* 34.5* 29.0* 29.2*  MCV 75.9*  --  79.5*  --   PLT 146*  --  127*  --    Basic Metabolic Panel: Recent Labs  Lab 01/05/23 1127 01/06/23 0559  NA 138 141  K 3.6 3.3*  CL 102 109  CO2 26 23  GLUCOSE 139* 88  BUN 25* 24*  CREATININE 0.87 0.66  CALCIUM 9.5 8.5*  MG  --  1.8  PHOS  --  3.2   GFR: Estimated Creatinine Clearance: 52.8 mL/min (by C-G formula based on SCr of 0.66 mg/dL). Liver Function Tests: Recent Labs  Lab 01/05/23 1127 01/06/23 0559  AST 28 33  ALT 18 20  ALKPHOS 57 45  BILITOT 0.5 0.8  PROT 7.2 5.9*  ALBUMIN 3.9 3.0*   No results for input(s): "LIPASE", "AMYLASE" in the last 168 hours. No results for input(s): "AMMONIA" in the last 168 hours. Coagulation Profile: No results for input(s): "INR", "PROTIME" in the last 168 hours. Cardiac Enzymes: No results for input(s): "CKTOTAL", "CKMB", "CKMBINDEX", "TROPONINI" in the last 168 hours. BNP (last 3 results) No results for input(s): "PROBNP" in the last 8760 hours. HbA1C: No results for input(s): "HGBA1C" in the last 72 hours. CBG: Recent Labs  Lab 01/06/23 0815  GLUCAP 85   Lipid Profile: No results for input(s): "CHOL", "HDL", "LDLCALC", "TRIG", "CHOLHDL", "LDLDIRECT" in the last 72 hours. Thyroid Function Tests: No results for  input(s): "TSH", "T4TOTAL", "FREET4", "T3FREE", "THYROIDAB" in the last 72 hours. Anemia Panel: Recent Labs    01/06/23 0559  VITAMINB12 641  FOLATE 28.3  FERRITIN 79  TIBC 315  IRON 38  RETICCTPCT 0.9   Sepsis Labs: No results for input(s): "PROCALCITON", "LATICACIDVEN" in the last 168 hours.  No results found for this or any previous visit (from the past 240 hour(s)).   Radiology Studies: No results found.  Scheduled Meds:  [START ON 01/07/2023] influenza  vaccine adjuvanted  0.5 mL Intramuscular Tomorrow-1000   lidocaine  1 patch Transdermal Q24H   pantoprazole (PROTONIX) IV  40 mg Intravenous QHS   [START ON 01/07/2023] pneumococcal 20-valent conjugate vaccine  0.5 mL Intramuscular Tomorrow-1000   potassium chloride  40 mEq Oral BID   Continuous Infusions:  sodium chloride      LOS: 0 days   Tami Merles, DO [facility] Available via Epic secure chat 7am-7pm After these hours, please refer to coverage provider listed on [email or web address] 01/06/2023, 12:17 PM

## 2023-01-06 NOTE — Consult Note (Signed)
Referring Provider: Dr. Marland Mcalpine Primary Care Physician:  Pcp, No Primary Gastroenterologist:  Gentry Fitz  Reason for Consultation:  Rectal bleeding  HPI: Tami Schmidt is a 89 y.o. female with acute onset of bright red blood per rectum X 2 yesterday and one episode of dark red stool last night (per her report; no stools documented) without dizziness, abdominal pain, N/V. Colonoscopy in 2019 showed diffuse diverticulosis and 2 small hyperplastic polyps were removed. Hgb 11 on admit and 8.9 today. Feels ok.  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   Diabetes mellitus    fasting 90-100   HTN (hypertension) 03/25/2013   Hx of radiation therapy    breasts hx   Hypercholesterolemia    Hypertension    Lymphedema of arm    left arm   Pulmonary embolism (HCC)    Type II or unspecified type diabetes mellitus without mention of complication, not stated as uncontrolled 03/25/2013    Past Surgical History:  Procedure Laterality Date   ABDOMINAL HYSTERECTOMY     ANKLE SURGERY Right 1995   APPENDECTOMY     BIOPSY  01/07/2018   Procedure: BIOPSY;  Surgeon: Kerin Salen, MD;  Location: [facility];  Service: Gastroenterology;;   BREAST SURGERY Bilateral    mastectomy   COLONOSCOPY WITH PROPOFOL N/A 01/07/2018   Procedure: COLONOSCOPY WITH PROPOFOL;  Surgeon: Kerin Salen, MD;  Location: [facility];  Service: Gastroenterology;  Laterality: N/A;   ESOPHAGOGASTRODUODENOSCOPY (EGD) WITH PROPOFOL N/A 01/06/2018   Procedure: ESOPHAGOGASTRODUODENOSCOPY (EGD) WITH PROPOFOL;  Surgeon: Kerin Salen, MD;  Location: [facility];  Service: Gastroenterology;  Laterality: N/A;   HERNIA REPAIR  04-26-2010   IR IMAGING GUIDED PORT INSERTION  10/29/2021   MASS EXCISION Left 01/26/2013   Procedure: EXCISION LEFT CHEST WALL MASS AND LEFT ABDOMNAL WALL MASS;  Surgeon: [name], MD;  Location: [facility];  Service: General;  Laterality: Left;   MASS EXCISION Left 09/20/2014   Procedure: EXCISION OF LEFT CHEST WALL MASS;  Surgeon: [name], MD;  Location: [facility];  Service: General;  Laterality: Left;   TEE WITHOUT CARDIOVERSION N/A 12/08/2017   Procedure: TRANSESOPHAGEAL ECHOCARDIOGRAM (TEE);  Surgeon: Lewayne Bunting, MD;  Location: [facility] END[facility]COPY;  Service: Cardiovascular;  Laterality: N/A;   TOTAL HIP ARTHROPLASTY Right 01/05/2017   TOTAL HIP ARTHROPLASTY Right 01/05/2017   Procedure: TOTAL HIP ARTHROPLASTY ANTERIOR APPROACH;  Surgeon: Gean Birchwood, MD;  Location: [facility] OR;  Service: Orthopedics;  Laterality: Right;    Prior to Admission medications   Medication Sig Start Date End Date Taking? Authorizing Provider  acetaminophen (TYLENOL) 500 MG tablet Take 1,000 mg by mouth as needed for moderate pain.   Yes [provider]  amLODipine (NORVASC) 10 MG tablet Take 10 mg by mouth daily.   Yes [provider]  amoxicillin (AMOXIL) 500 MG capsule Take 2,000 mg by mouth as needed. Take 1 hour prior to Dentist Appt. 11/17/22  Yes [provider]  lidocaine (LIDODERM) 5 % Place 1 patch onto the skin daily. Remove & Discard patch within 12 hours or as directed by MD 01/03/23  Yes Hessie Diener, Irving Burton, DO  metFORMIN (GLUCOPHAGE) 500 MG tablet Take 500 mg by mouth daily with breakfast.   Yes [provider]  olmesartan-hydrochlorothiazide (BENICAR HCT) 40-12.5 MG tablet Take 1 tablet by  mouth daily.   Yes [provider]  SM IRON 325 (65 Fe) MG tablet Take 325 mg by mouth daily. 07/22/19  Yes [provider]  Turmeric (QC TUMERIC COMPLEX PO) Take 1 tablet by mouth daily.   Yes [provider]  VITAMIN D PO Take 1 tablet by mouth daily.   Yes [provider]    Scheduled Meds:  [START ON 01/07/2023] influenza vaccine adjuvanted  0.5 mL Intramuscular Tomorrow-1000   lidocaine  1 patch  Transdermal Q24H   pantoprazole (PROTONIX) IV  40 mg Intravenous QHS   [START ON 01/07/2023] pneumococcal 20-valent conjugate vaccine  0.5 mL Intramuscular Tomorrow-1000   potassium chloride  40 mEq Oral BID   Continuous Infusions:  sodium chloride 75 mL/hr at 01/06/23 0100   PRN Meds:.acetaminophen **OR** acetaminophen, albuterol, bisacodyl, hydrALAZINE, HYDROmorphone (DILAUDID) injection, ondansetron **OR** ondansetron (ZOFRAN) IV, oxyCODONE, polyethylene glycol  Allergies as of 01/05/2023 - Review Complete 01/05/2023  Allergen Reaction Noted   Bactrim Swelling 06/23/2011   Lisinopril Swelling 06/23/2011   Vasotec Swelling 06/23/2011   Sulfamethoxazole-trimethoprim  06/15/2020    Family History  Problem Relation Age of Onset   Breast cancer Cousin        maternal first cousin   Uterine cancer Cousin        maternal first cousin    Social History   Socioeconomic History   Marital status: Married    Spouse name: Not on file   Number of children: Not on file   Years of education: Not on file   Highest education level: Not on file  Occupational History   Occupation: retired Child psychotherapist  Tobacco Use   Smoking status: Former    Current packs/day: 0.00    Types: Cigarettes    Quit date: 04/14/1972    Years since quitting: 50.7   Smokeless tobacco: Never  Vaping Use   Vaping status: Never Used  Substance and Sexual Activity   Alcohol use: Yes    Comment: occasional   Drug use: Not Currently   Sexual activity: Yes    Birth control/protection: Surgical  Other Topics Concern   Not on file  Social History Narrative   Not on file   Social Determinants of Health   Financial Resource Strain: Not on file  Food Insecurity: No Food Insecurity (01/06/2023)   Hunger Vital Sign    Worried About Running Out of Food in the Last Year: Never true    Ran Out of Food in the Last Year: Never true  Transportation Needs: No Transportation Needs (01/06/2023)   PRAPARE - Therapist, art (Medical): No    Lack of Transportation (Non-Medical): No  Physical Activity: Not on file  Stress: Not on file  Social Connections: Not on file  Intimate Partner Violence: Not At Risk (01/06/2023)   Humiliation, Afraid, Rape, and Kick questionnaire    Fear of Current or Ex-Partner: No    Emotionally Abused: No    Physically Abused: No    Sexually Abused: No    Review of Systems: All negative except as stated above in HPI.  Physical Exam: Vital signs: Vitals:   01/06/23 0206 01/06/23 0559  BP: 125/61 128/60  Pulse: 74 72  Resp: 16 16  Temp: 98.4 F (36.9 C) 98.2 F (36.8 C)  SpO2: 98% 100%   Last BM Date : 01/05/23 General:   Lethargic, eldlery, Well-developed, well-nourished, pleasant and cooperative in NAD Head: normocephalic, atraumatic Eyes: anicteric sclera ENT: oropharynx  clear Neck: supple, nontender Lungs:  Clear throughout to auscultation.   No wheezes, crackles, or rhonchi. No acute distress. Heart:  Regular rate and rhythm; no murmurs, clicks, rubs,  or gallops. Abdomen: soft, nontender, nondistended, +BS  Rectal:  Deferred Ext: no edema  GI:  Lab Results: Recent Labs    01/05/23 1127 01/05/23 1815 01/06/23 0559  WBC 5.8  --  6.1  HGB 11.0* 10.6* 8.8*  HCT 34.7* 34.5* 29.0*  PLT 146*  --  127*   BMET Recent Labs    01/05/23 1127 01/06/23 0559  NA 138 141  K 3.6 3.3*  CL 102 109  CO2 26 23  GLUCOSE 139* 88  BUN 25* 24*  CREATININE 0.87 0.66  CALCIUM 9.5 8.5*   LFT Recent Labs    01/06/23 0559  PROT 5.9*  ALBUMIN 3.0*  AST 33  ALT 20  ALKPHOS 45  BILITOT 0.8   PT/INR No results for input(s): "LABPROT", "INR" in the last 72 hours.   Studies/Results: No results found.  Impression/Plan: Painless hematochezia likely due to diverticular bleeding. Bleeding seems to have subsided. Follow H/Hs closely. Manage conservatively. No plans for updated colonoscopy at this time. Advance diet. If stable ok to d/c  tomorrow.    LOS: 0 days   Shirley Friar  01/06/2023, 11:21 AM  Questions please call [phone]

## 2023-01-06 NOTE — Evaluation (Signed)
Physical Therapy Evaluation Patient Details Name: Tami Schmidt MRN: [id number] DOB: 1932-08-03 Today's Date: 01/06/2023  History of Present Illness  Patient is a 89 year old female who presented on 9/23 with left side low back pain with two stools with bright red blood. Patient was admitted with thrombocytopenia and likely lower GI bleed. PMH: DM II, PE, HTN, breast cancer bilateral mastectomies with carcinoma metastatic to lymph node, arthritis.  Clinical Impression  Pt admitted with above diagnosis. At baseline, pt is independent.  Today, pt requiring supervision and did ambulate with antalgic pattern that is not her baseline.  Reports low left back/flank pain, imaging was negative.  With further history, pt reports was moving potted plants the day before her pain started - suspect likely cause of pain.  Provided pt with general strengthening exercises and encouraged gentle stretching and ongoing mobility.  If pain does not continue to improve could f/u with outpt PT in a few weeks. Despite pain pt was still able to transfer with supervision and ambulated 150' safely.  Pt currently with functional limitations due to the deficits listed below (see PT Problem List). Pt will benefit from acute skilled PT to increase their independence and safety with mobility to allow discharge.           If plan is discharge home, recommend the following: A little help with walking and/or transfers;A little help with bathing/dressing/bathroom;Assistance with cooking/housework;Help with stairs or ramp for entrance   Can travel by private vehicle        Equipment Recommendations None recommended by PT  Recommendations for Other Services       Functional Status Assessment Patient has had a recent decline in their functional status and demonstrates the ability to make significant improvements in function in a reasonable and predictable amount of time.     Precautions / Restrictions Precautions Precautions:  Fall Restrictions Weight Bearing Restrictions: No      Mobility  Bed Mobility Overal bed mobility: Needs Assistance Bed Mobility: Supine to Sit, Sit to Supine     Supine to sit: Modified independent (Device/Increase time) Sit to supine: Modified independent (Device/Increase time)   General bed mobility comments: increased time, did log roll technique on her own    Transfers Overall transfer level: Needs assistance Equipment used: None Transfers: Sit to/from Stand Sit to Stand: Contact guard assist, Supervision           General transfer comment: CGA progressed to supervision; performed x 6 during session ; required use of UE    Ambulation/Gait Ambulation/Gait assistance: Supervision, Contact guard assist Gait Distance (Feet): 150 Feet Assistive device: IV Pole Gait Pattern/deviations: Step-to pattern, Decreased stance time - left Gait velocity: normal     General Gait Details: genu varus bil (greater on L); no overt LOB; did have antalgic pattern - reports that is not baseline, states L low back sore  Stairs            Wheelchair Mobility     Tilt Bed    Modified Rankin (Stroke Patients Only)       Balance Overall balance assessment: Needs assistance Sitting-balance support: No upper extremity supported Sitting balance-Leahy Scale: Good     Standing balance support: No upper extremity supported Standing balance-Leahy Scale: Good                               Pertinent Vitals/Pain Pain Assessment Pain Assessment: 0-10 Pain Score: 4  Pain Location: left lower back Pain Descriptors / Indicators: Discomfort, Grimacing Pain Intervention(s): Limited activity within patient's tolerance, Monitored during session, Repositioned    Home Living Family/patient expects to be discharged to:: Private residence Living Arrangements: Spouse/significant other Available Help at Discharge: Family;Available 24 hours/day Type of Home: House Home  Access: Level entry     Alternate Level Stairs-Number of Steps: 16 Home Layout: Two level;Other (Comment);Full bath on main level (bedroom upstairs) Home Equipment: Cane - single Librarian, academic (2 wheels)      Prior Function Prior Level of Function : Independent/Modified Independent;Driving             Mobility Comments: Pt ambulate in home without AD, uses cane to go in community to be cautious ADLs Comments: Pt independent with adls, iadls, driving, shopping     Extremity/Trunk Assessment   Upper Extremity Assessment Upper Extremity Assessment: Defer to OT evaluation    Lower Extremity Assessment Lower Extremity Assessment: LLE deficits/detail;RLE deficits/detail RLE Deficits / Details: ROM WFL; MMT: 5/5 ankle DF, 5/5 knee ext, 5/5 hip flexion; Noted genu varus worse on L than R; reports hx R hip sx LLE Deficits / Details: ROM WFL; MMT: 5/5 ankle DF, 5/5 knee ext, 5/5 hip flexion; Noted genu varus worse on L than R    Cervical / Trunk Assessment Cervical / Trunk Assessment: Kyphotic  Communication      Cognition Arousal: Alert Behavior During Therapy: WFL for tasks assessed/performed Overall Cognitive Status: Within Functional Limits for tasks assessed                                 General Comments: plesant and cooperative        General Comments   Back Pain: Pt reports back pain began on Saturday.  Describes pain as soreness L lateral low back/flank.  States no pain at rest but increases with activity and causes her to limp.  Pt states she has not had this pain in the past.  Pt reports trying to figure out what is causing and wondered if it was from L leg feeling shorter than R due to increased genu varus at baseline.  Discussed it could cause pain but with acute onset potential other cause.  Asked pt about any changes in activity and she reports she was moving heavy potted plants on her porch Thurs/Friday last week.  Discussed highly likely  from that activity.  Encouraged gentle movements , no heavy lifting, and mobility.  Educated pt on gentle stretching and provided heat pack for area, tolerated well.  Discussed if not improving in next couple of weeks could f/u with PCP or outpt PT.    Pt's Goals: Pt reports she has been to 4 outpt PT and her only goal was to be able to climb stairs alternating pattern and they did not address. Discussed with pt, step to pattern is safer to prevent falls with her age and current back pain.  Advised continuing step to pattern and use of rails. Pt reports wanting to get stronger.  Reports she does exercise videos for elderly and described open chain exercises.  Educated on sit to stands progressing from use of hands to no hands or to lower chair height for strengthening, heel raises holding counter, and bridging.     Exercises Other Exercises Other Exercises: 6 time STS; 8 x bridging Other Exercises: Gentle low back stretching at EOB (bil lateral flexion, forward flexion, gentle  rotation)- 1 time each direction, 5 sec hold   Assessment/Plan    PT Assessment Patient needs continued PT services  PT Problem List Decreased strength;Pain;Decreased range of motion;Decreased activity tolerance;Decreased balance;Decreased mobility;Decreased knowledge of use of DME       PT Treatment Interventions DME instruction;Therapeutic exercise;Gait training;Balance training;Stair training;Functional mobility training;Therapeutic activities;Patient/family education;Modalities    PT Goals (Current goals can be found in the Care Plan section)  Acute Rehab PT Goals Patient Stated Goal: return home; be able to climb stairs with alternating pattern PT Goal Formulation: With patient Time For Goal Achievement: 01/20/23 Potential to Achieve Goals: Good    Frequency Min 1X/week     Co-evaluation               AM-PAC PT "6 Clicks" Mobility  Outcome Measure Help needed turning from your back to your side while  in a flat bed without using bedrails?: None Help needed moving from lying on your back to sitting on the side of a flat bed without using bedrails?: None Help needed moving to and from a bed to a chair (including a wheelchair)?: A Little Help needed standing up from a chair using your arms (e.g., wheelchair or bedside chair)?: A Little Help needed to walk in hospital room?: A Little Help needed climbing 3-5 steps with a railing? : A Little 6 Click Score: 20    End of Session Equipment Utilized During Treatment: Gait belt Activity Tolerance: Patient tolerated treatment well Patient left: in bed;with call bell/phone within reach;with bed alarm set Nurse Communication: Mobility status PT Visit Diagnosis: Other abnormalities of gait and mobility (R26.89);Muscle weakness (generalized) (M62.81);Pain Pain - Right/Left: Left Pain - part of body:  (low back)    Time: 0347-4259 PT Time Calculation (min) (ACUTE ONLY): 26 min   Charges:   PT Evaluation $PT Eval Low Complexity: 1 Low PT Treatments $Therapeutic Exercise: 8-22 mins PT General Charges $$ ACUTE PT VISIT: 1 Visit         Anise Salvo, PT Acute Rehab [facility] [phone]   Rayetta Humphrey 01/06/2023, 1:34 PM

## 2023-01-07 DIAGNOSIS — A045 Campylobacter enteritis: Secondary | ICD-10-CM | POA: Diagnosis present

## 2023-01-07 DIAGNOSIS — E8809 Other disorders of plasma-protein metabolism, not elsewhere classified: Secondary | ICD-10-CM | POA: Diagnosis present

## 2023-01-07 DIAGNOSIS — D62 Acute posthemorrhagic anemia: Secondary | ICD-10-CM | POA: Diagnosis present

## 2023-01-07 DIAGNOSIS — M549 Dorsalgia, unspecified: Secondary | ICD-10-CM | POA: Diagnosis present

## 2023-01-07 DIAGNOSIS — Z79810 Long term (current) use of selective estrogen receptor modulators (SERMs): Secondary | ICD-10-CM | POA: Diagnosis not present

## 2023-01-07 DIAGNOSIS — E876 Hypokalemia: Secondary | ICD-10-CM | POA: Diagnosis present

## 2023-01-07 DIAGNOSIS — Z96641 Presence of right artificial hip joint: Secondary | ICD-10-CM | POA: Diagnosis present

## 2023-01-07 DIAGNOSIS — C779 Secondary and unspecified malignant neoplasm of lymph node, unspecified: Secondary | ICD-10-CM | POA: Diagnosis present

## 2023-01-07 DIAGNOSIS — I10 Essential (primary) hypertension: Secondary | ICD-10-CM | POA: Diagnosis present

## 2023-01-07 DIAGNOSIS — Z86711 Personal history of pulmonary embolism: Secondary | ICD-10-CM | POA: Diagnosis not present

## 2023-01-07 DIAGNOSIS — K5731 Diverticulosis of large intestine without perforation or abscess with bleeding: Secondary | ICD-10-CM | POA: Diagnosis present

## 2023-01-07 DIAGNOSIS — K625 Hemorrhage of anus and rectum: Secondary | ICD-10-CM | POA: Diagnosis present

## 2023-01-07 DIAGNOSIS — D696 Thrombocytopenia, unspecified: Secondary | ICD-10-CM | POA: Diagnosis present

## 2023-01-07 DIAGNOSIS — Z23 Encounter for immunization: Secondary | ICD-10-CM | POA: Diagnosis present

## 2023-01-07 DIAGNOSIS — Z882 Allergy status to sulfonamides status: Secondary | ICD-10-CM | POA: Diagnosis not present

## 2023-01-07 DIAGNOSIS — E119 Type 2 diabetes mellitus without complications: Secondary | ICD-10-CM | POA: Diagnosis present

## 2023-01-07 DIAGNOSIS — Z79899 Other long term (current) drug therapy: Secondary | ICD-10-CM | POA: Diagnosis not present

## 2023-01-07 DIAGNOSIS — Z9013 Acquired absence of bilateral breasts and nipples: Secondary | ICD-10-CM | POA: Diagnosis not present

## 2023-01-07 DIAGNOSIS — Z888 Allergy status to other drugs, medicaments and biological substances status: Secondary | ICD-10-CM | POA: Diagnosis not present

## 2023-01-07 DIAGNOSIS — K922 Gastrointestinal hemorrhage, unspecified: Secondary | ICD-10-CM | POA: Diagnosis present

## 2023-01-07 DIAGNOSIS — Z87891 Personal history of nicotine dependence: Secondary | ICD-10-CM | POA: Diagnosis not present

## 2023-01-07 DIAGNOSIS — C50312 Malignant neoplasm of lower-inner quadrant of left female breast: Secondary | ICD-10-CM | POA: Diagnosis present

## 2023-01-07 DIAGNOSIS — Z803 Family history of malignant neoplasm of breast: Secondary | ICD-10-CM | POA: Diagnosis not present

## 2023-01-07 DIAGNOSIS — K5521 Angiodysplasia of colon with hemorrhage: Secondary | ICD-10-CM | POA: Diagnosis present

## 2023-01-07 DIAGNOSIS — E7800 Pure hypercholesterolemia, unspecified: Secondary | ICD-10-CM | POA: Diagnosis present

## 2023-01-07 DIAGNOSIS — Z923 Personal history of irradiation: Secondary | ICD-10-CM | POA: Diagnosis not present

## 2023-01-07 LAB — CBC
HCT: 28 % — ABNORMAL LOW (ref 36.0–46.0)
Hemoglobin: 8.6 g/dL — ABNORMAL LOW (ref 12.0–15.0)
MCH: 25.4 pg — ABNORMAL LOW (ref 26.0–34.0)
MCHC: 30.7 g/dL (ref 30.0–36.0)
MCV: 82.8 fL (ref 80.0–100.0)
Platelets: 108 10*3/uL — ABNORMAL LOW (ref 150–400)
RBC: 3.38 MIL/uL — ABNORMAL LOW (ref 3.87–5.11)
RDW: 17 % — ABNORMAL HIGH (ref 11.5–15.5)
WBC: 9.5 10*3/uL (ref 4.0–10.5)
nRBC: 0 % (ref 0.0–0.2)

## 2023-01-07 LAB — COMPREHENSIVE METABOLIC PANEL
ALT: 21 U/L (ref 0–44)
AST: 34 U/L (ref 15–41)
Albumin: 2.8 g/dL — ABNORMAL LOW (ref 3.5–5.0)
Alkaline Phosphatase: 40 U/L (ref 38–126)
Anion gap: 5 (ref 5–15)
BUN: 26 mg/dL — ABNORMAL HIGH (ref 8–23)
CO2: 23 mmol/L (ref 22–32)
Calcium: 7.9 mg/dL — ABNORMAL LOW (ref 8.9–10.3)
Chloride: 111 mmol/L (ref 98–111)
Creatinine, Ser: 0.68 mg/dL (ref 0.44–1.00)
GFR, Estimated: >60 mL/min (ref >60)
Glucose, Bld: 127 mg/dL — ABNORMAL HIGH (ref 70–99)
Potassium: 4.4 mmol/L (ref 3.5–5.1)
Sodium: 139 mmol/L (ref 135–145)
Total Bilirubin: 0.4 mg/dL (ref 0.3–1.2)
Total Protein: 5.3 g/dL — ABNORMAL LOW (ref 6.5–8.1)

## 2023-01-07 LAB — CBC WITH DIFFERENTIAL/PLATELET
Abs Immature Granulocytes: 0.04 10*3/uL (ref 0.00–0.07)
Basophils Absolute: 0 10*3/uL (ref 0.0–0.1)
Basophils Relative: 0 %
Eosinophils Absolute: 0.1 10*3/uL (ref 0.0–0.5)
Eosinophils Relative: 2 %
HCT: 22 % — ABNORMAL LOW (ref 36.0–46.0)
Hemoglobin: 6.7 g/dL — CL (ref 12.0–15.0)
Immature Granulocytes: 1 %
Lymphocytes Relative: 28 %
Lymphs Abs: 2 10*3/uL (ref 0.7–4.0)
MCH: 24.2 pg — ABNORMAL LOW (ref 26.0–34.0)
MCHC: 30.5 g/dL (ref 30.0–36.0)
MCV: 79.4 fL — ABNORMAL LOW (ref 80.0–100.0)
Monocytes Absolute: 0.5 10*3/uL (ref 0.1–1.0)
Monocytes Relative: 7 %
Neutro Abs: 4.5 10*3/uL (ref 1.7–7.7)
Neutrophils Relative %: 62 %
Platelets: 119 10*3/uL — ABNORMAL LOW (ref 150–400)
RBC: 2.77 MIL/uL — ABNORMAL LOW (ref 3.87–5.11)
RDW: 17.5 % — ABNORMAL HIGH (ref 11.5–15.5)
WBC: 7.1 10*3/uL (ref 4.0–10.5)
nRBC: 0 % (ref 0.0–0.2)

## 2023-01-07 LAB — PHOSPHORUS: Phosphorus: 2.6 mg/dL (ref 2.5–4.6)

## 2023-01-07 LAB — HEMOGLOBIN AND HEMATOCRIT, BLOOD
HCT: 46.6 % — ABNORMAL HIGH (ref 36.0–46.0)
Hemoglobin: 14.3 g/dL (ref 12.0–15.0)

## 2023-01-07 LAB — MAGNESIUM: Magnesium: 2 mg/dL (ref 1.7–2.4)

## 2023-01-07 LAB — GLUCOSE, CAPILLARY: Glucose-Capillary: 117 mg/dL — ABNORMAL HIGH (ref 70–99)

## 2023-01-07 LAB — PREPARE RBC (CROSSMATCH)

## 2023-01-07 MED ORDER — SODIUM CHLORIDE 0.9% IV SOLUTION: Freq: Once | INTRAVENOUS | Status: AC

## 2023-01-07 NOTE — Plan of Care (Signed)
Problem: Clinical Measurements: Goal: Cardiovascular complication will be avoided Outcome: Progressing   Problem: Activity: Goal: Risk for activity intolerance will decrease Outcome: Progressing   Problem: Nutrition: Goal: Adequate nutrition will be maintained Outcome: Progressing   Problem: Safety: Goal: Ability to remain free from injury will improve Outcome: Progressing

## 2023-01-07 NOTE — Progress Notes (Signed)
[facility] Gastroenterology Progress Note  [name] 89 y.o. November 15, 1932   Subjective: Three episodes of rectal bleeding last night. No bleeding or BMs today. Denies abdominal pain.   Objective: Vital signs: Vitals:   01/06/23 2220 01/07/23 0518  BP: (!) 157/86 120/64  Pulse:  75  Resp:    Temp:  98.4 F (36.9 C)  SpO2: 99% 100%  R 20  Physical Exam: Gen: lethargic, elderly, no acute distress  HEENT: anicteric sclera CV: RRR Chest: CTA B Abd: soft, nontender, nondistended, +BS Ext: no edema  Lab Results: Recent Labs    01/06/23 0559 01/07/23 0549  NA 141 139  K 3.3* 4.4  CL 109 111  CO2 23 23  GLUCOSE 88 127*  BUN 24* 26*  CREATININE 0.66 0.68  CALCIUM 8.5* 7.9*  MG 1.8 2.0  PHOS 3.2 2.6   Recent Labs    01/06/23 0559 01/07/23 0549  AST 33 34  ALT 20 21  ALKPHOS 45 40  BILITOT 0.8 0.4  PROT 5.9* 5.3*  ALBUMIN 3.0* 2.8*   Recent Labs    01/06/23 0559 01/06/23 1111 01/06/23 2347 01/07/23 0549  WBC 6.1  --   --  7.1  NEUTROABS 3.7  --   --  4.5  HGB 8.8*   < > 14.3 6.7*  HCT 29.0*   < > 46.6* 22.0*  MCV 79.5*  --   --  79.4*  PLT 127*  --   --  119*   < > = values in this interval not displayed.      Assessment/Plan: Painless hematochezia - suspect diverticular. Bleeding last night and Hgb drop to 6.7 from 8.7. I think the 14.3 is an error. Agree with planned blood transfusion. Keep on clear liquid diet today and if no further bleeding can advance tomorrow. If bleeding recurs, then would recommend CT angiogram and may also need an updated colonoscopy. Supportive care.   Shirley Friar 01/07/2023, 12:15 PM  Questions please call [phone] ID: Tami Schmidt, female   DOB: 02/04/1933, 89 y.o.   MRN: [id number]

## 2023-01-07 NOTE — Progress Notes (Signed)
Progress Note   Patient: Tami Schmidt:[id number] DOB: 03-25-1933 DOA: 01/05/2023     0 DOS: the patient was seen and examined on 01/07/2023   Brief hospital course: 89yo with h/o metastatic breast cancer s/p bilateral mastectomies on chemotherapy, diabetes mellitus type 2, hypertension, PE, and hyperlipidemia who presented on 9/23 with BRBPR.  GI consulted, recommended advancing diet and managing conservatively with no plans for updated colonoscopy at this time.    Assessment and Plan:  Acute lower GI bleeding Patient presenting with BRBPR Differential to include bleeding hemorrhoids; bacterial infectious colitis with likely pathogen Campylobacter jejuni; colonic diverticula; and AVM - but most likely etiology is diverticular bleeding Admitted to telemetry -> med surg Continue to monitor for recurrent bleeding If significant bleeding, a CTA could be considered to try to determine the most likely source of the bleeding  GI consult has been requested No intervention is planned currently CLD for today, consider advancing tomorrow if no further bleeding episodes occur  ABLA Her Hgb decreased from 11 on presentation to 6.7 today, following multiple frankly bloody stools overnight Type and screen were done in ED.  Two units of blood were ordered   -Monitor closely and follow cbc, transfuse as necessary for Hbg <7   Hypokalemia Resolved  Thrombocytopenia Mild, possibly related to GIB Thrombocytopenia could also be in the setting of her breast cancer treatment Will follow   Diabetes Mellitus Type 2 A1c was 5.5 in July, good control Will cover with sensitive-scale insulin   Hypertension Hold Home antihypertensives (Olmesartan-hydrochlorothiazide 40-12.5 mg po Daily and Amlodipine 10 mg po Daily) for now given concern for lower GI bleeding Continue monitor blood pressures per protocol   History of PE No longer anticoagulated given history of GI bleeding previously   Back  Pain C/w lidocaine patch 1 patch transdermally every 12 hours  Continue with pain control acetaminophen 650 mg p.o./RC every 6 as needed and oxycodone 5 mg p.o. every 6 as needed severe pain and breakthrough pain  Recurrent Triple Positive Breast Cancer  S/p lumpectomy as well as B mastectomy and 5 years of tamoxifen She is on second line Kadcyla and tolerating well and sees oncology in outpatient setting and will be seen within a few weeks given that she sees the oncologist every 3 weeks with repeat labs and Cycle     Consultants: GI PT/OT TOC team  Procedures: None  Antibiotics: None      Subjective: 2-3 large frankly bloody BMs last night, none since.  She is open to receiving blood.   Objective: Vitals:   01/07/23 1616 01/07/23 1647  BP: (!) 113/58 135/65  Pulse: 72 70  Resp: 16 15  Temp: 98.5 F (36.9 C) 98 F (36.7 C)  SpO2: 99% 99%    Intake/Output Summary (Last 24 hours) at 01/07/2023 1747 Last data filed at 01/07/2023 1610 Gross per 24 hour  Intake 339.17 ml  Output --  Net 339.17 ml   Filed Weights   01/05/23 1125 01/06/23 0500 01/07/23 0518  Weight: 81.6 kg 79.5 kg 81 kg    Exam:  General:  Appears calm and comfortable and is in NAD Eyes:  EOMI, normal lids, iris ENT:  grossly normal hearing, lips & tongue, mmm Neck:  no LAD, masses or thyromegaly Cardiovascular:  RRR, no m/r/g. No LE edema.  Respiratory:   CTA bilaterally with no wheezes/rales/rhonchi.  Normal respiratory effort. Abdomen:  soft, NT, ND Skin:  no rash or induration seen on limited exam Musculoskeletal:  grossly normal tone BUE/BLE, good ROM, no bony abnormality Psychiatric:  grossly normal mood and affect, speech fluent and appropriate, AOx3 Neurologic:  CN 2-12 grossly intact, moves all extremities in coordinated fashion  Data Reviewed: I have reviewed the patient's lab results since admission.  Pertinent labs for today include:   Glucose 127 Albumin 2.8 WBC 7.1 Hgb  6.7, down from 8.8 Platelets 119    Family Communication: None present; she declined to have me call her husband today  Disposition: Status is: Inpatient Admit - It is my clinical opinion that admission to INPATIENT is reasonable and necessary because of the expectation that this patient will require hospital care that crosses at least 2 midnights to treat this condition based on the medical complexity of the problems presented.  Given the aforementioned information, the predictability of an adverse outcome is felt to be significant.      Time spent: 50 minutes  Unresulted Labs (From admission, onward)     Start     Ordered   01/08/23 0500  Basic metabolic panel  Tomorrow morning,   R        01/07/23 1747   01/06/23 0500  CBC  Tomorrow morning,   R        01/05/23 1852             Author: Jonah Blue, MD 01/07/2023 5:47 PM  For on call review [email or web address].

## 2023-01-07 NOTE — Progress Notes (Signed)
Moderate Rectal bleeding x 3(2200-2230) asymptomatic  vss On call notified hgb 14.4

## 2023-01-07 NOTE — Progress Notes (Signed)
Hgb 6.7 no rectal bleeding since 2230(3 frank episodes) asymptomatic vss On call notified

## 2023-01-08 DIAGNOSIS — K922 Gastrointestinal hemorrhage, unspecified: Secondary | ICD-10-CM | POA: Diagnosis not present

## 2023-01-08 LAB — TYPE AND SCREEN
ABO/RH(D): B POS
Antibody Screen: NEGATIVE
Unit division: 0
Unit division: 0

## 2023-01-08 LAB — BASIC METABOLIC PANEL
Anion gap: 6 (ref 5–15)
BUN: 19 mg/dL (ref 8–23)
CO2: 21 mmol/L — ABNORMAL LOW (ref 22–32)
Calcium: 8.4 mg/dL — ABNORMAL LOW (ref 8.9–10.3)
Chloride: 111 mmol/L (ref 98–111)
Creatinine, Ser: 0.48 mg/dL (ref 0.44–1.00)
GFR, Estimated: >60 mL/min (ref >60)
Glucose, Bld: 96 mg/dL (ref 70–99)
Potassium: 3.6 mmol/L (ref 3.5–5.1)
Sodium: 138 mmol/L (ref 135–145)

## 2023-01-08 LAB — BPAM RBC
Blood Product Expiration Date: 202410022359
Blood Product Expiration Date: 202410302359
ISSUE DATE / TIME: 202409251317
ISSUE DATE / TIME: 202409251625
Unit Type and Rh: 1700
Unit Type and Rh: 7300

## 2023-01-08 LAB — GLUCOSE, CAPILLARY: Glucose-Capillary: 104 mg/dL — ABNORMAL HIGH (ref 70–99)

## 2023-01-08 LAB — CBC
HCT: 26.1 % — ABNORMAL LOW (ref 36.0–46.0)
Hemoglobin: 8.3 g/dL — ABNORMAL LOW (ref 12.0–15.0)
MCH: 25.7 pg — ABNORMAL LOW (ref 26.0–34.0)
MCHC: 31.8 g/dL (ref 30.0–36.0)
MCV: 80.8 fL (ref 80.0–100.0)
Platelets: 119 10*3/uL — ABNORMAL LOW (ref 150–400)
RBC: 3.23 MIL/uL — ABNORMAL LOW (ref 3.87–5.11)
RDW: 17.1 % — ABNORMAL HIGH (ref 11.5–15.5)
WBC: 7.8 10*3/uL (ref 4.0–10.5)
nRBC: 0 % (ref 0.0–0.2)

## 2023-01-08 NOTE — Progress Notes (Signed)
[facility] Gastroenterology Progress Note  [name] 89 y.o. 1932/11/06   Subjective: Feels good. No bleeding or BMs overnight or today.  Objective: Vital signs: Vitals:   01/07/23 1928 01/08/23 0409  BP: (!) 155/86 (!) 148/66  Pulse: 77 69  Resp: 18 18  Temp: 97.7 F (36.5 C) 98.6 F (37 C)  SpO2: 100% 99%    Physical Exam: Gen: alert, no acute distress, elderly, well-nourished, pleasant HEENT: anicteric sclera CV: RRR Chest: CTA B Abd: soft, nontender, nondistended, +BS Ext: no edema  Lab Results: Recent Labs    01/06/23 0559 01/07/23 0549 01/08/23 0506  NA 141 139 138  K 3.3* 4.4 3.6  CL 109 111 111  CO2 23 23 21*  GLUCOSE 88 127* 96  BUN 24* 26* 19  CREATININE 0.66 0.68 0.48  CALCIUM 8.5* 7.9* 8.4*  MG 1.8 2.0  --   PHOS 3.2 2.6  --    Recent Labs    01/06/23 0559 01/07/23 0549  AST 33 34  ALT 20 21  ALKPHOS 45 40  BILITOT 0.8 0.4  PROT 5.9* 5.3*  ALBUMIN 3.0* 2.8*   Recent Labs    01/06/23 0559 01/06/23 1111 01/07/23 0549 01/07/23 2239 01/08/23 0821  WBC 6.1  --  7.1 9.5 7.8  NEUTROABS 3.7  --  4.5  --   --   HGB 8.8*   < > 6.7* 8.6* 8.3*  HCT 29.0*   < > 22.0* 28.0* 26.1*  MCV 79.5*  --  79.4* 82.8 80.8  PLT 127*  --  119* 108* 119*   < > = values in this interval not displayed.      Assessment/Plan: Painless hematochezia likely diverticular that has resolved. Hgb stable at 8.3. Ok for d/c home from GI standpoint. F/U with [facility] in November (office will arrange). If rebleeding occurs as an outpt then will need an updated colonoscopy otherwise hold off due to advanced age.   Tami Schmidt 01/08/2023, 1:01 PM  Questions please call [phone] ID: Tami Schmidt, female   DOB: 1933-03-23, 89 y.o.   MRN: [id number]

## 2023-01-08 NOTE — Discharge Summary (Signed)
Physician Discharge Summary   Patient: Tami Schmidt MRN: [id number] DOB: 05/02/1932  Admit date:     01/05/2023  Discharge date: 01/08/23  Discharge Physician: Jonah Blue   PCP: Pcp, No   Recommendations at discharge:   Follow up with Dr. Al Pimple in 1-2 weeks for repeat CBC Return to the ER with any further rectal bleeding Follow up with GI - call to schedule appointment   Discharge Diagnoses: Principal Problem:   Acute lower GI bleeding Active Problems:   Essential hypertension   Malignant neoplasm of lower-inner quadrant of left breast in female, estrogen receptor positive (HCC)   Diabetes mellitus type II, controlled, with no complications (HCC)   ABLA (acute blood loss anemia)   Hospital Course: 89yo with h/o metastatic breast cancer s/p bilateral mastectomies on chemotherapy, diabetes mellitus type 2, hypertension, PE, and hyperlipidemia who presented on 9/23 with BRBPR.  GI consulted, recommended advancing diet and managing conservatively with no plans for updated colonoscopy at this time unless she continues to bleed.    Assessment and Plan:  Acute lower GI bleeding Patient presenting with BRBPR Differential to include bleeding hemorrhoids; bacterial infectious colitis with likely pathogen Campylobacter jejuni; colonic diverticula; and AVM - but most likely etiology is diverticular bleeding Admitted to telemetry -> med surg No recurrent bleeding for approaching 48 hours GI consulted No intervention is planned currently Advance diet to soft and dc to home later today if tolerating If she re-bleeds, further evaluation is needed (now and perhaps in the future as well)   ABLA Her Hgb decreased from 11 on presentation to 6.7 yesterday, following multiple frankly bloody stools overnight Two units of blood were ordered and transfused on 9/25 Her Hgb is slightly lower today than last night but this is likely compensation since she denies any further bleeding  episodes  Thrombocytopenia Mild, possibly related to GIB Thrombocytopenia could also be in the setting of her breast cancer treatment F/u with Dr. Al Pimple   Diabetes Mellitus Type 2 A1c was 5.5 in July, good control Resume metformin  Hypertension Resume home antihypertensives (Olmesartan-hydrochlorothiazide 40-12.5 mg po Daily and Amlodipine 10 mg po Daily)   History of PE No longer anticoagulated given history of GI bleeding previously   Back Pain C/w lidocaine patch 1 patch transdermally every 12 hours  Continue with pain control acetaminophen 650 mg p.o./RC every 6 as needed and oxycodone 5 mg p.o. every 6 as needed severe pain and breakthrough pain Consider outpatient PT if not improving in the next few weeks   Recurrent Triple Positive Breast Cancer  S/p lumpectomy as well as B mastectomy and 5 years of tamoxifen She is on second line Kadcyla and tolerating well and sees oncology in outpatient setting and will be seen within a few weeks given that she sees the oncologist every 3 weeks with repeat labs every cycle         Consultants: GI PT/OT TOC team   Procedures: None   Antibiotics: None   30 Day Unplanned Readmission Risk Score    Flowsheet Row ED to Hosp-Admission (Current) from 01/05/2023 in [facility]  30 Day Unplanned Readmission Risk Score (%) 24.16 Filed at 01/08/2023 1200       This score is the patient's risk of an unplanned readmission within 30 days of being discharged (0 -100%). The score is based on dignosis, age, lab data, medications, orders, and past utilization.   Low:  0-14.9   Medium: 15-21.9  High: 22-29.9   Extreme: 30 and above          Pain control - [employer] Controlled Substance Reporting System database was reviewed. and patient was instructed, not to drive, operate heavy machinery, perform activities at heights, swimming or participation in water activities or provide baby-sitting  services while on Pain, Sleep and Anxiety Medications; until their outpatient Physician has advised to do so again. Also recommended to not to take more than prescribed Pain, Sleep and Anxiety Medications.   Disposition: Home Diet recommendation:  Regular diet DISCHARGE MEDICATION: Allergies as of 01/08/2023       Reactions   Bactrim Swelling   SWELLING OF MOUTH/FACE.   Lisinopril Swelling   SWELLING OF MOUTH/FACE.   Vasotec Swelling   SWELLING OF MOUTH/FACE.   Sulfamethoxazole-trimethoprim    Other reaction(s): Unknown        Medication List     TAKE these medications    acetaminophen 500 MG tablet Commonly known as: TYLENOL Take 1,000 mg by mouth as needed for moderate pain.   amLODipine 10 MG tablet Commonly known as: NORVASC Take 10 mg by mouth daily.   amoxicillin 500 MG capsule Commonly known as: AMOXIL Take 2,000 mg by mouth as needed. Take 1 hour prior to Dentist Appt.   lidocaine 5 % Commonly known as: Lidoderm Place 1 patch onto the skin daily. Remove & Discard patch within 12 hours or as directed by MD   metFORMIN 500 MG tablet Commonly known as: GLUCOPHAGE Take 500 mg by mouth daily with breakfast.   olmesartan-hydrochlorothiazide 40-12.5 MG tablet Commonly known as: BENICAR HCT Take 1 tablet by mouth daily.   QC TUMERIC COMPLEX PO Take 1 tablet by mouth daily.   SM Iron 325 (65 Fe) MG tablet Generic drug: ferrous sulfate Take 325 mg by mouth daily.   VITAMIN D PO Take 1 tablet by mouth daily.        Discharge Exam: Filed Weights   01/06/23 0500 01/07/23 0518 01/08/23 0409  Weight: 79.5 kg 81 kg 81.6 kg     Subjective: No further bleeding since Tuesday night.  Feeling well, hungry, eager to go home today once she advances her diet.   Objective: Vitals:   01/07/23 1928 01/08/23 0409  BP: (!) 155/86 (!) 148/66  Pulse: 77 69  Resp: 18 18  Temp: 97.7 F (36.5 C) 98.6 F (37 C)  SpO2: 100% 99%    Intake/Output Summary (Last  24 hours) at 01/08/2023 1248 Last data filed at 01/07/2023 1915 Gross per 24 hour  Intake 665.17 ml  Output --  Net 665.17 ml   Filed Weights   01/06/23 0500 01/07/23 0518 01/08/23 0409  Weight: 79.5 kg 81 kg 81.6 kg    Exam:  General:  Appears calm and comfortable and is in NAD Eyes:  EOMI, normal lids, iris ENT:  grossly normal hearing, lips & tongue, mmm Neck:  no LAD, masses or thyromegaly Cardiovascular:  RRR, no m/r/g. No LE edema.  Respiratory:   CTA bilaterally with no wheezes/rales/rhonchi.  Normal respiratory effort. Abdomen:  soft, NT, ND Skin:  no rash or induration seen on limited exam Musculoskeletal:  grossly normal tone BUE/BLE, good ROM, no bony abnormality Psychiatric:  grossly normal mood and affect, speech fluent and appropriate, AOx3 Neurologic:  CN 2-12 grossly intact, moves all extremities in coordinated fashion  Data Reviewed: I have reviewed the patient's lab results since admission.  Pertinent labs for today include:  Unremarkable BMP WBC 9.5 Hgb  8.6 last night, today 8.3     Condition at discharge: improving  The results of significant diagnostics from this hospitalization (including imaging, microbiology, ancillary and laboratory) are listed below for reference.   Imaging Studies: DG Lumbar Spine Complete  Result Date: 01/03/2023 CLINICAL DATA:  Left low back pain EXAM: LUMBAR SPINE - COMPLETE 4+ VIEW COMPARISON:  CT 12/18/2022 FINDINGS: No fracture or dislocation. Stable alignment. Narrowing of interspaces L1-L4 with anterior endplate spurring at all lumbar levels. Right hip arthroplasty. Aortic Atherosclerosis (ICD10-170.0). IMPRESSION: 1. No acute findings. 2. Multilevel degenerative disc disease. Electronically Signed   By: Corlis Leak M.D.   On: 01/03/2023 13:52   CT CHEST ABDOMEN PELVIS W CONTRAST  Result Date: 12/22/2022 CLINICAL DATA:  Metastatic breast cancer restaging * Tracking Code: BO * EXAM: CT CHEST, ABDOMEN, AND PELVIS WITH  CONTRAST TECHNIQUE: Multidetector CT imaging of the chest, abdomen and pelvis was performed following the standard protocol during bolus administration of intravenous contrast. RADIATION DOSE REDUCTION: This exam was performed according to the departmental dose-optimization program which includes automated exposure control, adjustment of the mA and/or kV according to patient size and/or use of iterative reconstruction technique. CONTRAST:  OMNIPAQUE IOHEXOL 300 MG/ML  SOLN COMPARISON:  09/02/2022 FINDINGS: CT CHEST FINDINGS Cardiovascular: Right chest port catheter. Aortic atherosclerosis. Mild cardiomegaly. Three-vessel coronary artery calcifications. Enlargement of the main pulmonary artery measuring up to 4.0 cm in caliber. No pericardial effusion. Mediastinum/Nodes: Slightly diminished size of enlarged left axillary and subpectoral nodes, largest left axillary node conglomerate measuring 3.1 x 1.6 cm, previously 3.7 x 1.8 cm (series 2, image 29). Unchanged, partially calcified left subpectoral lymph node (series 2, image 22). Unchanged subcarinal lymph nodes measuring up to 1.3 x 1.1 cm (series 2, image 32). Small hiatal hernia. Thyroid gland, trachea, and esophagus demonstrate no significant findings. Lungs/Pleura: Lungs are clear. No pleural effusion or pneumothorax. Musculoskeletal: Status post bilateral mastectomy. No acute osseous findings. CT ABDOMEN PELVIS FINDINGS Hepatobiliary: No solid liver abnormality is seen. Small gallstones and sludge. No gallbladder wall thickening, or biliary dilatation. Pancreas: Unremarkable. No pancreatic ductal dilatation or surrounding inflammatory changes. Spleen: Normal in size without significant abnormality. Adrenals/Urinary Tract: Adrenal glands are unremarkable. Kidneys are normal, without renal calculi, solid lesion, or hydronephrosis. Bladder is unremarkable. Stomach/Bowel: Stomach is within normal limits. Appendix not clearly visualized. No evidence of bowel  wall thickening, distention, or inflammatory changes. Descending and sigmoid diverticulosis. Vascular/Lymphatic: Aortic atherosclerosis. No enlarged abdominal or pelvic lymph nodes. Reproductive: Status post hysterectomy. Other: Unchanged large, broad-based low midline ventral hernia containing multiple loops of nonobstructed bowel (series 2, image 106). No ascites. Musculoskeletal: No acute osseous findings. IMPRESSION: 1. Slightly diminished size of enlarged left axillary and subpectoral nodes, consistent with treatment response. Unchanged, partially calcified left subpectoral lymph node. Unchanged subcarinal lymph nodes. 2. No evidence of new lymphadenopathy or metastatic disease in the chest, abdomen, or pelvis. 3. Status post bilateral mastectomy. 4. Cholelithiasis. 5. Coronary artery disease. 6. Enlargement of the main pulmonary artery, as can be seen in pulmonary hypertension. Aortic Atherosclerosis (ICD10-I70.0). Electronically Signed   By: Jearld Lesch M.D.   On: 12/22/2022 10:48    Microbiology: Results for orders placed or performed during the hospital encounter of 04/27/21  Resp Panel by RT-PCR (Flu A&B, Covid) Nasopharyngeal Swab     Status: None   Collection Time: 04/27/21  5:31 PM   Specimen: Nasopharyngeal Swab; Nasopharyngeal(NP) swabs in vial transport medium  Result Value Ref Range Status   SARS Coronavirus 2  by RT PCR NEGATIVE NEGATIVE Final    Comment: (NOTE) SARS-CoV-2 target nucleic acids are NOT DETECTED.  The SARS-CoV-2 RNA is generally detectable in upper respiratory specimens during the acute phase of infection. The lowest concentration of SARS-CoV-2 viral copies this assay can detect is 138 copies/mL. A negative result does not preclude SARS-Cov-2 infection and should not be used as the sole basis for treatment or other patient management decisions. A negative result may occur with  improper specimen collection/handling, submission of specimen other than nasopharyngeal  swab, presence of viral mutation(s) within the areas targeted by this assay, and inadequate number of viral copies(<138 copies/mL). A negative result must be combined with clinical observations, patient history, and epidemiological information. The expected result is Negative.  Fact Sheet for Patients:  BloggerCourse.com  Fact Sheet for Healthcare Providers:  SeriousBroker.it  This test is no t yet approved or cleared by the [place] FDA and  has been authorized for detection and/or diagnosis of SARS-CoV-2 by FDA under an Emergency Use Authorization (EUA). This EUA will remain  in effect (meaning this test can be used) for the duration of the COVID-19 declaration under Section 564(b)(1) of the Act, 21 U.S.C.section 360bbb-3(b)(1), unless the authorization is terminated  or revoked sooner.       Influenza A by PCR NEGATIVE NEGATIVE Final   Influenza B by PCR NEGATIVE NEGATIVE Final    Comment: (NOTE) The Xpert Xpress SARS-CoV-2/FLU/RSV plus assay is intended as an aid in the diagnosis of influenza from Nasopharyngeal swab specimens and should not be used as a sole basis for treatment. Nasal washings and aspirates are unacceptable for Xpert Xpress SARS-CoV-2/FLU/RSV testing.  Fact Sheet for Patients: BloggerCourse.com  Fact Sheet for Healthcare Providers: SeriousBroker.it  This test is not yet approved or cleared by the [place] FDA and has been authorized for detection and/or diagnosis of SARS-CoV-2 by FDA under an Emergency Use Authorization (EUA). This EUA will remain in effect (meaning this test can be used) for the duration of the COVID-19 declaration under Section 564(b)(1) of the Act, 21 U.S.C. section 360bbb-3(b)(1), unless the authorization is terminated or revoked.  Performed at [facility], [address] [place], [place] [address]      Labs: CBC: Recent Labs  Lab 01/05/23 1127 01/05/23 1815 01/06/23 0559 01/06/23 1111 01/06/23 1714 01/06/23 2347 01/07/23 0549 01/07/23 2239 01/08/23 0821  WBC 5.8  --  6.1  --   --   --  7.1 9.5 7.8  NEUTROABS 4.0  --  3.7  --   --   --  4.5  --   --   HGB 11.0*   < > 8.8*   < > 8.7* 14.3 6.7* 8.6* 8.3*  HCT 34.7*   < > 29.0*   < > 28.5* 46.6* 22.0* 28.0* 26.1*  MCV 75.9*  --  79.5*  --   --   --  79.4* 82.8 80.8  PLT 146*  --  127*  --   --   --  119* 108* 119*   < > = values in this interval not displayed.   Basic Metabolic Panel: Recent Labs  Lab 01/05/23 1127 01/06/23 0559 01/07/23 0549 01/08/23 0506  NA 138 141 139 138  K 3.6 3.3* 4.4 3.6  CL 102 109 111 111  CO2 26 23 23  21*  GLUCOSE 139* 88 127* 96  BUN 25* 24* 26* 19  CREATININE 0.87 0.66 0.68 0.48  CALCIUM 9.5 8.5* 7.9* 8.4*  MG  --  1.8 2.0  --   PHOS  --  3.2 2.6  --    Liver Function Tests: Recent Labs  Lab 01/05/23 1127 01/06/23 0559 01/07/23 0549  AST 28 33 34  ALT 18 20 21   ALKPHOS 57 45 40  BILITOT 0.5 0.8 0.4  PROT 7.2 5.9* 5.3*  ALBUMIN 3.9 3.0* 2.8*   CBG: Recent Labs  Lab 01/06/23 0815 01/07/23 0815 01/08/23 0719  GLUCAP 85 117* 104*    Discharge time spent: greater than 30 minutes.  Signed: Jonah Blue, MD [facility] 01/08/2023

## 2023-01-08 NOTE — TOC Transition Note (Signed)
Transition of Care [facility]) - CM/SW Discharge Note   Patient Details  Name: CARYOL SHANE MRN: [id number] Date of Birth: [birth year]  [facility]) CM/SW Contact:  Otelia Santee, LCSW Phone Number: 01/08/2023, 12:58 PM   Clinical Narrative:    PCP information placed on AVS for pt to schedule a follow up appointment.    Final next level of care: Home/Self Care Barriers to Discharge: No Barriers Identified   Patient Goals and CMS Choice CMS Medicare.gov Compare Post Acute Care list provided to:: Patient Choice offered to / list presented to : Patient  Discharge Placement                         Discharge Plan and Services Additional resources added to the After Visit Summary for                  DME Arranged: N/A DME Agency: NA                  Social Determinants of Health (SDOH) Interventions SDOH Screenings   Food Insecurity: No Food Insecurity (01/06/2023)  Housing: Low Risk  (01/06/2023)  Transportation Needs: No Transportation Needs (01/06/2023)  Utilities: Not At Risk (01/06/2023)  Depression (PHQ2-9): Low Risk  (09/05/2022)  Tobacco Use: Medium Risk (01/05/2023)     Readmission Risk Interventions    01/08/2023   12:57 PM  Readmission Risk Prevention Plan  Transportation Screening Complete  PCP or Specialist Appt within 3-5 Days Complete  HRI or Home Care Consult Complete  Social Work Consult for Recovery Care Planning/Counseling Complete  Palliative Care Screening Not Applicable  Medication Review [employer]) Complete

## 2023-01-09 ENCOUNTER — Emergency Department (HOSPITAL_COMMUNITY): Payer: Medicare Other

## 2023-01-09 ENCOUNTER — Encounter (HOSPITAL_COMMUNITY): Payer: Self-pay

## 2023-01-09 ENCOUNTER — Emergency Department (HOSPITAL_COMMUNITY)
Admission: EM | Admit: 2023-01-09 | Discharge: 2023-01-09 | Disposition: A | Discharge status: Home / Self Care | Payer: Medicare Other | Attending: Emergency Medicine | Admitting: Emergency Medicine

## 2023-01-09 ENCOUNTER — Other Ambulatory Visit: Payer: Self-pay

## 2023-01-09 ENCOUNTER — Ambulatory Visit: Payer: Self-pay

## 2023-01-09 DIAGNOSIS — R0602 Shortness of breath: Secondary | ICD-10-CM | POA: Insufficient documentation

## 2023-01-09 DIAGNOSIS — E119 Type 2 diabetes mellitus without complications: Secondary | ICD-10-CM | POA: Insufficient documentation

## 2023-01-09 DIAGNOSIS — Z79899 Other long term (current) drug therapy: Secondary | ICD-10-CM | POA: Insufficient documentation

## 2023-01-09 DIAGNOSIS — Z7984 Long term (current) use of oral hypoglycemic drugs: Secondary | ICD-10-CM | POA: Diagnosis not present

## 2023-01-09 DIAGNOSIS — Z853 Personal history of malignant neoplasm of breast: Secondary | ICD-10-CM | POA: Diagnosis not present

## 2023-01-09 DIAGNOSIS — I10 Essential (primary) hypertension: Secondary | ICD-10-CM | POA: Diagnosis not present

## 2023-01-09 LAB — COMPREHENSIVE METABOLIC PANEL
ALT: 20 U/L (ref 0–44)
AST: 37 U/L (ref 15–41)
Albumin: 3.6 g/dL (ref 3.5–5.0)
Alkaline Phosphatase: 54 U/L (ref 38–126)
Anion gap: 14 (ref 5–15)
BUN: 13 mg/dL (ref 8–23)
CO2: 21 mmol/L — ABNORMAL LOW (ref 22–32)
Calcium: 9.2 mg/dL (ref 8.9–10.3)
Chloride: 106 mmol/L (ref 98–111)
Creatinine, Ser: 0.7 mg/dL (ref 0.44–1.00)
GFR, Estimated: >60 mL/min (ref >60)
Glucose, Bld: 116 mg/dL — ABNORMAL HIGH (ref 70–99)
Potassium: 3.6 mmol/L (ref 3.5–5.1)
Sodium: 141 mmol/L (ref 135–145)
Total Bilirubin: 0.7 mg/dL (ref 0.3–1.2)
Total Protein: 7.1 g/dL (ref 6.5–8.1)

## 2023-01-09 LAB — CBC WITH DIFFERENTIAL/PLATELET
Abs Immature Granulocytes: 0.02 10*3/uL (ref 0.00–0.07)
Basophils Absolute: 0 10*3/uL (ref 0.0–0.1)
Basophils Relative: 0 %
Eosinophils Absolute: 0.1 10*3/uL (ref 0.0–0.5)
Eosinophils Relative: 2 %
HCT: 37.5 % (ref 36.0–46.0)
Hemoglobin: 12 g/dL (ref 12.0–15.0)
Immature Granulocytes: 0 %
Lymphocytes Relative: 16 %
Lymphs Abs: 1 10*3/uL (ref 0.7–4.0)
MCH: 25.8 pg — ABNORMAL LOW (ref 26.0–34.0)
MCHC: 32 g/dL (ref 30.0–36.0)
MCV: 80.6 fL (ref 80.0–100.0)
Monocytes Absolute: 0.4 10*3/uL (ref 0.1–1.0)
Monocytes Relative: 6 %
Neutro Abs: 4.7 10*3/uL (ref 1.7–7.7)
Neutrophils Relative %: 76 %
Platelets: 122 10*3/uL — ABNORMAL LOW (ref 150–400)
RBC: 4.65 MIL/uL (ref 3.87–5.11)
RDW: 17.7 % — ABNORMAL HIGH (ref 11.5–15.5)
WBC: 6.2 10*3/uL (ref 4.0–10.5)
nRBC: 0 % (ref 0.0–0.2)

## 2023-01-09 LAB — TROPONIN I (HIGH SENSITIVITY)
Troponin I (High Sensitivity): 14 ng/L (ref <18)
Troponin I (High Sensitivity): 15 ng/L (ref <18)

## 2023-01-09 LAB — BRAIN NATRIURETIC PEPTIDE: B Natriuretic Peptide: 144.4 pg/mL — ABNORMAL HIGH (ref 0.0–100.0)

## 2023-01-09 MED ORDER — IOHEXOL 350 MG/ML SOLN
75.0000 mL | Freq: Once | INTRAVENOUS | Status: AC | PRN
  Administered 2023-01-09: 75 mL via INTRAVENOUS

## 2023-01-09 NOTE — ED Triage Notes (Signed)
Recently got blood transfusion and chemo for breast cancer. Here today for shortness of breath and generalized weakness since an hour ago. Alert and oriented x 4. Not on blood thinners currently. 100% on RA.

## 2023-01-09 NOTE — Discharge Instructions (Signed)
Tami Schmidt:  Thank you for allowing [place] to take care of you today.  We hope you begin feeling better soon. You were seen today for shortness of breath.  Your laboratory studies were good.  Hemoglobin was well above the need for any transfusion.  There is no sign of blood clots or heart attack.  This is all very reassuring.  To-Do: Please follow-up with your primary doctor to schedule an appointment with a new primary care doctor within the next 2-3 days.  Please return to the Emergency Department or call 911 if you experience chest pain, shortness of breath, severe pain, severe fever, altered mental status, or have any reason to think that you need emergency medical care.  Thank you again.  Hope you feel better soon.

## 2023-01-09 NOTE — ED Notes (Signed)
Pt in NAD at d/c from ED. A&O. Ambulatory. Respirations even & unlabored. Skin warm & dry. Pt verbalized understanding of d/c teaching including follow up care and reasons to return to the ED. No needs or questions expressed at d/c.

## 2023-01-09 NOTE — Telephone Encounter (Signed)
     Chief Complaint: Pt. SOB."I'm sitting in my recliner and I'm short of breath." Pt. Hospitalized this week and reports "I got a blood transfusion." Had GI bleed. "Just got short of breath at once." Symptoms: Shortness of breath Frequency: Today Pertinent Negatives: Patient denies chest pain Disposition: [x] ED /[] Urgent Care (no appt availability in office) / [] Appointment(In office/virtual)/ []  [place] [facility]/ [] Home Care/ [] Refused Recommended Disposition /[] [place] [facility]/ []  Follow-up with PCP Additional Notes: Pt. Will call 911.  Reason for Disposition  Sounds like a life-threatening emergency to the triager  Answer Assessment - Initial Assessment Questions 1. RESPIRATORY STATUS: "Describe your breathing?" (e.g., wheezing, shortness of breath, unable to speak, severe coughing)      SOB 2. ONSET: "When did this breathing problem begin?"      Today 3. PATTERN "Does the difficult breathing come and go, or has it been constant since it started?"      Constant 4. SEVERITY: "How bad is your breathing?" (e.g., mild, moderate, severe)    - MILD: No SOB at rest, mild SOB with walking, speaks normally in sentences, can lie down, no retractions, pulse < 100.    - MODERATE: SOB at rest, SOB with minimal exertion and prefers to sit, cannot lie down flat, speaks in phrases, mild retractions, audible wheezing, pulse 100-120.    - SEVERE: Very SOB at rest, speaks in single words, struggling to breathe, sitting hunched forward, retractions, pulse > 120      Moderate 5. RECURRENT SYMPTOM: "Have you had difficulty breathing before?" If Yes, ask: "When was the last time?" and "What happened that time?"      No 6. CARDIAC HISTORY: "Do you have any history of heart disease?" (e.g., heart attack, angina, bypass surgery, angioplasty)      No 7. LUNG HISTORY: "Do you have any history of lung disease?"  (e.g., pulmonary embolus, asthma, emphysema)     No 8. CAUSE: "What do you think is  causing the breathing problem?"      Unsure 9. OTHER SYMPTOMS: "Do you have any other symptoms? (e.g., dizziness, runny nose, cough, chest pain, fever)     No 10. O2 SATURATION MONITOR:  "Do you use an oxygen saturation monitor (pulse oximeter) at home?" If Yes, ask: "What is your reading (oxygen level) today?" "What is your usual oxygen saturation reading?" (e.g., 95%)       No 11. PREGNANCY: "Is there any chance you are pregnant?" "When was your last menstrual period?"       No 12. TRAVEL: "Have you traveled out of the country in the last month?" (e.g., travel history, exposures)       No  Protocols used: Breathing Difficulty-A-AH

## 2023-01-09 NOTE — ED Provider Notes (Signed)
[place] EMERGENCY DEPARTMENT AT [facility] Provider Note   CSN: [id number] Arrival date & time: 01/09/23  1512     History  Chief Complaint  Patient presents with   Shortness of Breath    Tami Schmidt is a 89 y.o. female.   Shortness of Breath 89 year old female with past medical history of breast cancer status post bilateral mastectomies on chemotherapy, type 2 diabetes, hypertension, prior PE (not on anticoagulation), recent admission for diverticulitis presenting for evaluation of shortness of breath.  Patient reports being discharged from the hospital yesterday after receiving 2 units of blood for diverticular bleeding.  She was feeling fine when she went home.  However, beginning this afternoon patient abruptly developed shortness of breath.  She states that it occurred while sitting watching her soap opera's.  Denies any accompanying chest pain, palpitations, lightheadedness, dizziness.  Patient states she has felt weak in recent days.  When she was admitted for her diverticular bleed, she was passing bright red stool.  She states that she has not had any bright red blood per rectum since Wednesday (3 days ago).  At present, patient states that her symptoms have completely resolved.  She states that she feels perfectly normal.  Patient states that she stopped taking any anticoagulation 6 to 7 months ago due to diverticular bleeding.     Home Medications Prior to Admission medications   Medication Sig Start Date End Date Taking? Authorizing Provider  acetaminophen (TYLENOL) 500 MG tablet Take 1,000 mg by mouth as needed for moderate pain.    [provider]  amLODipine (NORVASC) 10 MG tablet Take 10 mg by mouth daily.    [provider]  amoxicillin (AMOXIL) 500 MG capsule Take 2,000 mg by mouth as needed. Take 1 hour prior to Dentist Appt. 11/17/22   [provider]  lidocaine (LIDODERM) 5 % Place 1 patch onto the skin daily. Remove &  Discard patch within 12 hours or as directed by MD 01/03/23   Faith Rogue, DO  metFORMIN (GLUCOPHAGE) 500 MG tablet Take 500 mg by mouth daily with breakfast.    [provider]  olmesartan-hydrochlorothiazide (BENICAR HCT) 40-12.5 MG tablet Take 1 tablet by mouth daily.    [provider]  SM IRON 325 (65 Fe) MG tablet Take 325 mg by mouth daily. 07/22/19   [provider]  Turmeric (QC TUMERIC COMPLEX PO) Take 1 tablet by mouth daily.    [provider]  VITAMIN D PO Take 1 tablet by mouth daily.    [provider]      Allergies    Bactrim, Lisinopril, Vasotec, and Sulfamethoxazole-trimethoprim    Review of Systems   Review of Systems  Respiratory:  Positive for shortness of breath.     Physical Exam Updated Vital Signs BP (!) 152/89   Pulse 89   Temp 97.8 F (36.6 C) (Oral)   Resp 19   Ht 5\' 7"  (1.702 m)   Wt 81.6 kg   SpO2 100%   BMI 28.18 kg/m  Physical Exam Constitutional:      General: She is not in acute distress.    Appearance: She is not ill-appearing.  HENT:     Head: Normocephalic.     Mouth/Throat:     Mouth: Mucous membranes are moist.  Eyes:     Extraocular Movements: Extraocular movements intact.  Cardiovascular:     Rate and Rhythm: Normal rate.  Pulmonary:     Effort: Pulmonary effort is  normal. No tachypnea or respiratory distress.     Breath sounds: No decreased breath sounds or wheezing.  Chest:     Chest wall: No mass or tenderness.     Comments: Port right upper chest Abdominal:     Palpations: Abdomen is soft.     Tenderness: There is no abdominal tenderness. There is no guarding or rebound.  Musculoskeletal:     Right lower leg: No edema.     Left lower leg: No edema.     Comments: Entire left upper extremity is edematous when compared to right.  She states that this is chronic from her mastectomy.  No overlying erythema, tenderness  Skin:    General: Skin is warm and dry.     Findings: No  rash.  Neurological:     General: No focal deficit present.     Mental Status: She is alert.     ED Results / Procedures / Treatments   Labs (all labs ordered are listed, but only abnormal results are displayed) Labs Reviewed  CBC WITH DIFFERENTIAL/PLATELET - Abnormal; Notable for the following components:      Result Value   MCH 25.8 (*)    RDW 17.7 (*)    Platelets 122 (*)    All other components within normal limits  COMPREHENSIVE METABOLIC PANEL - Abnormal; Notable for the following components:   CO2 21 (*)    Glucose, Bld 116 (*)    All other components within normal limits  BRAIN NATRIURETIC PEPTIDE - Abnormal; Notable for the following components:   B Natriuretic Peptide 144.4 (*)    All other components within normal limits  TROPONIN I (HIGH SENSITIVITY)  TROPONIN I (HIGH SENSITIVITY)    EKG EKG Interpretation Date/Time:  Friday January 09 2023 16:19:45 EDT Ventricular Rate:  71 PR Interval:  203 QRS Duration:  102 QT Interval:  370 QTC Calculation: 402 R Axis:   28  Text Interpretation: Sinus rhythm Atrial premature complexes Abnormal R-wave progression, early transition Borderline T wave abnormalities Confirmed by Benjiman Core [phone]) on 01/09/2023 8:23:06 PM  Radiology CT Angio Chest PE W and/or Wo Contrast  Result Date: 01/09/2023 CLINICAL DATA:  Pulmonary embolism (PE) suspected, high prob SOB EXAM: CT ANGIOGRAPHY CHEST WITH CONTRAST TECHNIQUE: Multidetector CT imaging of the chest was performed using the standard protocol during bolus administration of intravenous contrast. Multiplanar CT image reconstructions and MIPs were obtained to evaluate the vascular anatomy. RADIATION DOSE REDUCTION: This exam was performed according to the departmental dose-optimization program which includes automated exposure control, adjustment of the mA and/or kV according to patient size and/or use of iterative reconstruction technique. CONTRAST:  75mL OMNIPAQUE IOHEXOL 350  MG/ML SOLN COMPARISON:  12/18/2022 FINDINGS: Cardiovascular: No filling defects in the pulmonary arteries to suggest pulmonary emboli. Cardiomegaly. Coronary artery and aortic atherosclerosis. Dilated main pulmonary artery and central pulmonary arteries bilaterally compatible with pulmonary arterial hypertension. Mediastinum/Nodes: Left axillary and subpectoral lymph nodes are again noted, stable since recent staging CT. Small subcarinal lymph node and scattered mediastinal lymph nodes are unchanged. No hilar adenopathy. Trachea and esophagus are unremarkable. Thyroid unremarkable. Lungs/Pleura: No confluent airspace opacities or effusions. No suspicious pulmonary nodules. Upper Abdomen: No acute findings Musculoskeletal: Status post bilateral mastectomy. No acute osseous abnormality. Review of the MIP images confirms the above findings. IMPRESSION: No evidence of pulmonary embolus. Cardiomegaly, coronary artery disease. Pulmonary arterial hypertension. Stable left axillary/subpectoral adenopathy. Stable borderline sized subcarinal and scattered mediastinal lymph nodes. Aortic Atherosclerosis (ICD10-I70.0). Electronically Signed  By: Charlett Nose M.D.   On: 01/09/2023 20:04   DG Chest Portable 1 View  Result Date: 01/09/2023 CLINICAL DATA:  Shortness of breath EXAM: PORTABLE CHEST 1 VIEW COMPARISON:  None Available. FINDINGS: Right chest port catheter. Cardiomegaly. Both lungs are clear. Severe bilateral glenohumeral arthrosis. IMPRESSION: 1. No acute abnormality of the lungs. 2. Cardiomegaly. Electronically Signed   By: Jearld Lesch M.D.   On: 01/09/2023 17:34    Procedures Procedures    Medications Ordered in ED Medications  iohexol (OMNIPAQUE) 350 MG/ML injection 75 mL (75 mLs Intravenous Contrast Given 01/09/23 1919)    ED Course/ Medical Decision Making/ A&P                                 Medical Decision Making Amount and/or Complexity of Data Reviewed Labs: ordered. Radiology:  ordered.  Risk Prescription drug management.   JASELLE PRYER is a 89 y.o. female with PMH of  of breast cancer status post bilateral mastectomies on chemotherapy, type 2 diabetes, hypertension, prior PE (not on anticoagulation), recent admission for diverticulitis, who presents to the ED with shortness of breath. Exam notable for edema to the left upper extremity, which she notes is chronic and related to mastectomy procedure. Afebrile, hemodynamically stable, SaO2 100%.  Patient's recent admission from 01/05/2023 through 01/08/2023 reviewed.  Patient was admitted with acute lower GI bleeding with bright red blood per rectum, felt to be secondary to diverticular bleeding.  She received 2 units of blood on 01/07/2023.  GI was consulted, but no acute intervention was planned.  Differential diagnosis includes: PE, pneumonia, COPD exacerbation, asthma exacerbation, CHF exacerbation, ACS, pneumothorax. Unlikely PNA as CXR without focal consolidation, no leukocytosis, no cough, no fever. Unlikely COPD/Asthma exacerbation as no wheezing, history of inhaler use. Unlikely CHF exacerbation as BNP is only mildly elevated at 144, no lower extremity edema. Unlikely ACS as troponin normal x 2 (14, 15), EKG sinus rhythm with occasional PACs, but no concerning ST segment elevations or depressions.  Nonischemic. Doubt Aortic Dissection, Pancreatitis, Pneumothorax, Arrhythmia, Endo/Myo/Pericarditis, Esophageal pathology, or other Emergent pathology.  Unlikely to be symptomatic anemia as patient's hemoglobin today was 12.  Patient has numerous risk factors for pulmonary embolism including active chemotherapy, known history of thrombosis while not on anticoagulation, therefore felt the likelihood of pulmonary emboli was reasonably high.  CTA was completed, this study was negative for acute clot burden.  Discussed above results with patient in depth.  She is very reassured and is asking to return home.  I feel that this is  reasonable at this time given unremarkable workup and that she has remained asymptomatic for multiple hours while in the emergency department.  Patient is safe for discharge home at this time.  She will follow-up with her primary care physician for additional recommendations.  Strict return precautions provided.  The plan for this patient was discussed with Dr. Rubin Payor, who voiced agreement and who oversaw evaluation and treatment of this patient.     Final Clinical Impression(s) / ED Diagnoses Final diagnoses:  Shortness of breath    Rx / DC Orders ED Discharge Orders     None         Lyman Speller, MD 01/09/23 2030    Benjiman Core, MD 01/12/23 1407

## 2023-01-14 ENCOUNTER — Inpatient Hospital Stay: Payer: Medicare Other

## 2023-01-14 ENCOUNTER — Inpatient Hospital Stay: Payer: Medicare Other | Attending: Adult Health | Admitting: Physician Assistant

## 2023-01-14 ENCOUNTER — Other Ambulatory Visit: Payer: Self-pay | Admitting: *Deleted

## 2023-01-14 VITALS — BP 151/80 | HR 77 | Temp 97.8°F | Resp 18

## 2023-01-14 VITALS — BP 158/68 | HR 87 | Temp 97.5°F | Resp 20 | Wt 178.0 lb

## 2023-01-14 DIAGNOSIS — E119 Type 2 diabetes mellitus without complications: Secondary | ICD-10-CM | POA: Insufficient documentation

## 2023-01-14 DIAGNOSIS — Z95828 Presence of other vascular implants and grafts: Secondary | ICD-10-CM

## 2023-01-14 DIAGNOSIS — G629 Polyneuropathy, unspecified: Secondary | ICD-10-CM | POA: Diagnosis not present

## 2023-01-14 DIAGNOSIS — C773 Secondary and unspecified malignant neoplasm of axilla and upper limb lymph nodes: Secondary | ICD-10-CM | POA: Insufficient documentation

## 2023-01-14 DIAGNOSIS — M8580 Other specified disorders of bone density and structure, unspecified site: Secondary | ICD-10-CM | POA: Diagnosis not present

## 2023-01-14 DIAGNOSIS — K5900 Constipation, unspecified: Secondary | ICD-10-CM | POA: Insufficient documentation

## 2023-01-14 DIAGNOSIS — Z5112 Encounter for antineoplastic immunotherapy: Secondary | ICD-10-CM | POA: Insufficient documentation

## 2023-01-14 DIAGNOSIS — C50312 Malignant neoplasm of lower-inner quadrant of left female breast: Secondary | ICD-10-CM | POA: Diagnosis not present

## 2023-01-14 DIAGNOSIS — R5383 Other fatigue: Secondary | ICD-10-CM | POA: Insufficient documentation

## 2023-01-14 DIAGNOSIS — D649 Anemia, unspecified: Secondary | ICD-10-CM

## 2023-01-14 DIAGNOSIS — Z170 Estrogen receptor positive status [ER+]: Secondary | ICD-10-CM

## 2023-01-14 DIAGNOSIS — I890 Lymphedema, not elsewhere classified: Secondary | ICD-10-CM | POA: Diagnosis not present

## 2023-01-14 DIAGNOSIS — C50919 Malignant neoplasm of unspecified site of unspecified female breast: Secondary | ICD-10-CM

## 2023-01-14 LAB — HEMOGLOBIN A1C
Hgb A1c MFr Bld: 6 % — ABNORMAL HIGH (ref 4.8–5.6)
Mean Plasma Glucose: 125.5 mg/dL

## 2023-01-14 LAB — CBC WITH DIFFERENTIAL (CANCER CENTER ONLY)
Abs Immature Granulocytes: 0.04 10*3/uL (ref 0.00–0.07)
Basophils Absolute: 0.1 10*3/uL (ref 0.0–0.1)
Basophils Relative: 1 %
Eosinophils Absolute: 0.2 10*3/uL (ref 0.0–0.5)
Eosinophils Relative: 2 %
HCT: 26.9 % — ABNORMAL LOW (ref 36.0–46.0)
Hemoglobin: 8.8 g/dL — ABNORMAL LOW (ref 12.0–15.0)
Immature Granulocytes: 1 %
Lymphocytes Relative: 25 %
Lymphs Abs: 2 10*3/uL (ref 0.7–4.0)
MCH: 26.4 pg (ref 26.0–34.0)
MCHC: 32.7 g/dL (ref 30.0–36.0)
MCV: 80.8 fL (ref 80.0–100.0)
Monocytes Absolute: 0.5 10*3/uL (ref 0.1–1.0)
Monocytes Relative: 6 %
Neutro Abs: 5.2 10*3/uL (ref 1.7–7.7)
Neutrophils Relative %: 65 %
Platelet Count: 212 10*3/uL (ref 150–400)
RBC: 3.33 MIL/uL — ABNORMAL LOW (ref 3.87–5.11)
RDW: 17.6 % — ABNORMAL HIGH (ref 11.5–15.5)
WBC Count: 7.9 10*3/uL (ref 4.0–10.5)
nRBC: 0 % (ref 0.0–0.2)

## 2023-01-14 LAB — IRON AND IRON BINDING CAPACITY (CC-WL,HP ONLY)
Iron: 32 ug/dL (ref 28–170)
Saturation Ratios: 9 % — ABNORMAL LOW (ref 10.4–31.8)
TIBC: 377 ug/dL (ref 250–450)
UIBC: 345 ug/dL (ref 148–442)

## 2023-01-14 LAB — CMP (CANCER CENTER ONLY)
ALT: 14 U/L (ref 0–44)
AST: 24 U/L (ref 15–41)
Albumin: 3.6 g/dL (ref 3.5–5.0)
Alkaline Phosphatase: 60 U/L (ref 38–126)
Anion gap: 7 (ref 5–15)
BUN: 13 mg/dL (ref 8–23)
CO2: 26 mmol/L (ref 22–32)
Calcium: 8.9 mg/dL (ref 8.9–10.3)
Chloride: 107 mmol/L (ref 98–111)
Creatinine: 0.75 mg/dL (ref 0.44–1.00)
GFR, Estimated: >60 mL/min (ref >60)
Glucose, Bld: 207 mg/dL — ABNORMAL HIGH (ref 70–99)
Potassium: 3.7 mmol/L (ref 3.5–5.1)
Sodium: 140 mmol/L (ref 135–145)
Total Bilirubin: 0.5 mg/dL (ref 0.3–1.2)
Total Protein: 6.6 g/dL (ref 6.5–8.1)

## 2023-01-14 LAB — FOLATE: Folate: 17.9 ng/mL (ref >5.9)

## 2023-01-14 LAB — RETIC PANEL
Immature Retic Fract: 19.5 % — ABNORMAL HIGH (ref 2.3–15.9)
RBC.: 3.33 MIL/uL — ABNORMAL LOW (ref 3.87–5.11)
Retic Count, Absolute: 69.9 10*3/uL (ref 19.0–186.0)
Retic Ct Pct: 2.1 % (ref 0.4–3.1)
Reticulocyte Hemoglobin: 21.3 pg — ABNORMAL LOW (ref >27.9)

## 2023-01-14 LAB — VITAMIN B12: Vitamin B-12: 536 pg/mL (ref 180–914)

## 2023-01-14 LAB — FERRITIN: Ferritin: 40 ng/mL (ref 11–307)

## 2023-01-14 MED ORDER — FERROUS SULFATE 325 (65 FE) MG PO TBEC: 325.0000 mg | DELAYED_RELEASE_TABLET | Freq: Every day | ORAL | 3 refills | Status: DC

## 2023-01-14 MED ORDER — SODIUM CHLORIDE 0.9% FLUSH
10.0000 mL | Freq: Once | INTRAVENOUS | Status: AC
  Administered 2023-01-14: 10 mL

## 2023-01-14 MED ORDER — HEPARIN SOD (PORK) LOCK FLUSH 100 UNIT/ML IV SOLN
500.0000 [IU] | Freq: Once | INTRAVENOUS | Status: AC | PRN
  Administered 2023-01-14: 500 [IU]

## 2023-01-14 MED ORDER — SODIUM CHLORIDE 0.9% FLUSH
10.0000 mL | INTRAVENOUS | Status: DC | PRN
  Administered 2023-01-14: 10 mL

## 2023-01-14 MED ORDER — DIPHENHYDRAMINE HCL 25 MG PO CAPS
50.0000 mg | ORAL_CAPSULE | Freq: Once | ORAL | Status: AC
  Administered 2023-01-14: 50 mg via ORAL
  Filled 2023-01-14: qty 2

## 2023-01-14 MED ORDER — SODIUM CHLORIDE 0.9 % IV SOLN
3.6000 mg/kg | Freq: Once | INTRAVENOUS | Status: AC
  Administered 2023-01-14: 320 mg via INTRAVENOUS
  Filled 2023-01-14: qty 8

## 2023-01-14 MED ORDER — ACETAMINOPHEN 325 MG PO TABS
650.0000 mg | ORAL_TABLET | Freq: Once | ORAL | Status: AC
  Administered 2023-01-14: 650 mg via ORAL
  Filled 2023-01-14: qty 2

## 2023-01-14 MED ORDER — PROCHLORPERAZINE MALEATE 10 MG PO TABS
10.0000 mg | ORAL_TABLET | Freq: Once | ORAL | Status: AC
  Administered 2023-01-14: 10 mg via ORAL
  Filled 2023-01-14: qty 1

## 2023-01-14 MED ORDER — SODIUM CHLORIDE 0.9 % IV SOLN: Freq: Once | INTRAVENOUS | Status: AC

## 2023-01-14 NOTE — Progress Notes (Unsigned)
[facility] Cancer Follow up:    System, Provider Not In No address on file   DIAGNOSIS:  Cancer Staging  Chest wall recurrence of breast cancer [facility]) Staging form: Breast, AJCC 7th Edition - Clinical: Stage Unknown (TX, N1, M0) - Signed by Lowella Dell, MD on 07/27/2014  Malignant neoplasm of lower-inner quadrant of left breast in female, estrogen receptor positive (HCC) Staging form: Breast, AJCC 7th Edition - Clinical: Stage IA (T1b, N0, M0) - Signed by Lowella Dell, MD on 07/27/2014   SUMMARY OF ONCOLOGIC HISTORY: (1) status post right lumpectomy and axillary dissection in 1993 in [place] for what appears to have been a stage I breast cancer, treated with radiation and then tamoxifen for 5 years   (2) status post right mastectomy 2002 for a recurrence in the right breast, treated adjuvantly with tamoxifen for an additional 5 years   (3) status post left mastectomy and sentinel lymph node sampling 03/07/2003 for a lower inner quadrant pT1b, pN0, stage IA mucinous breast cancer, grade 2; no radiation therapy was given. Tamoxifen was continued until 2009   RECURRENT DISEASE/ LEFT: OCTOBER 2014 (4) excisional biopsy of a left chest wall mass 01/26/2013 showing carcinoma consistent with invasive ductal carcinoma, estrogen receptor 100% positive, progesterone receptor 0% positive, with an MIB-1 of 22% and no HER-2 amplification.    (5)  PET scan on 03/03/2013 confirmed extensive metastatic adenopathy in the left axilla, left subpectoral musculature, and left supraclavicular region   (6)  started on anastrozole daily beginning 03/01/2013, interrupted during radiation therapy, resumed March 2015, discontinued March 2016 because of fatigue and arthralgias   (7) radiation therapy 03/31/2013 through 05/23/2013 Site/dose:   The patient was treated initially with a forward treatment planning technique to the left chest wall in addition to treatment to the  supraclavicular region. This consisted of a 3-D conformal technique. The patient was treated in this fashion to a dose of 50.4 gray. The patient then received a 14 gray boost treatment using an electron field. The total dose was 64.4 gray.   (8) Osteopenia, zometa yearly started 07/27/2014, but poorly tolerated.              (a) bone density scan on 12/27/13 showed a t-score of -1.8              (b) started Denosumab/Prolia April 2017, canceled after one dose per patient   (9) tamoxifen started 07/27/2014, stopped November 2019 (had total 5 years of antiestrogens)   (10) likely thalassemia trait   (11) left subpectoral soft tissue nodule noted 07/17/2015 unchanged through serial scans, most recent 02/09/2018, with no metabolic activity noted in PET scan of 2016 and interval calcification.  This is presumed benign   (12)  ADDITIONAL REGIONAL RECURRENCE:             (A) CT of the chest with contrast 03/05/2021 obtained to evaluate left upper extremity lymphedema as showed acute pulmonary emboli.  There was also left axillary and subpectoral lymphadenopathy             (B) rivaroxaban started 03/05/2021             (C) left breast ultrasound 03/29/2021 shows 2 areas in the infraclavicular tissue consistent with fat necrosis.  There were however multiple retropectoral interpectoral and left axillary masses.             (D) left axillary lymph node biopsy 04/04/2021: IDC grade 3, ER positive, 100%, strong staining  intensity, PR positive, 20%, weak staining intensity, Ki-67 of 15%, HER2 positive by IHC, ratio 2.90             (E) Fulvestrant every 4 weeks beginning 04/18/2021             (F) Herceptin every 3 weeks beginning 05/30/2021             Mild progression noted on 12/12 CT chest/abd/pelvis             (G) Kadcyla every 3 weeks beginning 05/01/2022  CURRENT THERAPY: Kadcyla  INTERVAL HISTORY: Tami Schmidt 89 y.o. female returns for follow-up on treatment with Kadcyla. Tami Schmidt was last seen by  Dr. Al Pimple on 12/25/2022. In the interim, Tami Schmidt was admitted from 01/05/2023-01/08/2023 for acute lower GI bleeding. Tami Schmidt receiving 2 units of PRBC on 01/07/2023.    Tami Schmidt reports that Tami Schmidt still as some fatigue since hospital discharge. Tami Schmidt denies any recurrent episodes of bleeding but has not had a bowel movement since discharge. Tami Schmidt reports her appetite and weight are stable. Tami Schmidt denies nausea, vomiting or abdominal pain. Tami Schmidt denies fevers, chills, sweats, shortness of breath, chest pain or cough. Tami Schmidt has no other complaints. Rest of the ROS is below.    Patient Active Problem List   Diagnosis Date Noted   Acute lower GI bleeding 01/07/2023   Rectal bleeding 01/05/2023   Acute GI bleeding 10/04/2022   Port-A-Cath in place 06/12/2022   Loss of perception for taste 03/17/2022   Primary osteoarthritis involving multiple joints 03/17/2022   Genetic testing 07/24/2021   Microcytosis 04/29/2021   Hypokalemia 04/28/2021   Pulmonary embolism (HCC)    Agnosia 08/09/2020   Hardening of the aorta (main artery of the heart) (HCC) 08/09/2020   Morbid obesity (HCC) 08/09/2020   Neuropathy 08/09/2020   Primary osteoarthritis 08/09/2020   Sensorineural hearing loss (SNHL) of both ears 08/07/2020   Anosmia 06/05/2020   Tinnitus, bilateral 06/05/2020   GI bleed 07/13/2019   ABLA (acute blood loss anemia) 07/12/2019   Microcytic anemia 01/05/2018   Melena 01/04/2018   Lower GI bleed 01/04/2018   Diabetes mellitus type II, controlled, with no complications (HCC) 12/05/2017   Tenosynovitis of left wrist 12/02/2017   Infection of left wrist (HCC) 11/30/2017   Primary osteoarthritis of right hip 01/05/2017   Osteoarthritis of right hip 01/03/2017   Lymphedema of upper extremity 01/17/2014   Osteopenia 01/17/2014   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   Dilated pore of [place] 08/02/2013   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   Diabetes mellitus    fasting 90-100   HTN (hypertension) 03/25/2013   Hx of radiation therapy    breasts hx   Hypercholesterolemia    Hypertension    Lymphedema of arm    left arm   Pulmonary  embolism (HCC)    Type II or unspecified type diabetes mellitus without mention of complication, not stated as uncontrolled 03/25/2013    SURGICAL HISTORY: Past Surgical History:  Procedure Laterality Date   ABDOMINAL HYSTERECTOMY     ANKLE SURGERY Right 1995   APPENDECTOMY     BIOPSY  01/07/2018   Procedure: BIOPSY;  Surgeon: Kerin Salen, MD;  Location: [facility];  Service: Gastroenterology;;   BREAST SURGERY Bilateral    mastectomy   COLONOSCOPY WITH PROPOFOL N/A 01/07/2018   Procedure: COLONOSCOPY WITH PROPOFOL;  Surgeon: Kerin Salen, MD;  Location: [facility];  Service: Gastroenterology;  Laterality: N/A;   ESOPHAGOGASTRODUODENOSCOPY (EGD) WITH PROPOFOL N/A 01/06/2018   Procedure: ESOPHAGOGASTRODUODENOSCOPY (EGD) WITH PROPOFOL;  Surgeon: Kerin Salen, MD;  Location: [facility];  Service: Gastroenterology;  Laterality: N/A;   HERNIA REPAIR  04-26-2010   IR IMAGING GUIDED PORT INSERTION  10/29/2021   MASS EXCISION Left 01/26/2013   Procedure: EXCISION LEFT CHEST WALL MASS AND LEFT ABDOMNAL WALL MASS;  Surgeon: [name], MD;  Location: [facility];   Service: General;  Laterality: Left;   MASS EXCISION Left 09/20/2014   Procedure: EXCISION OF LEFT CHEST WALL MASS;  Surgeon: [name], MD;  Location: [facility];  Service: General;  Laterality: Left;   TEE WITHOUT CARDIOVERSION N/A 12/08/2017   Procedure: TRANSESOPHAGEAL ECHOCARDIOGRAM (TEE);  Surgeon: Lewayne Bunting, MD;  Location: [facility];  Service: Cardiovascular;  Laterality: N/A;   TOTAL HIP ARTHROPLASTY Right 01/05/2017   TOTAL HIP ARTHROPLASTY Right 01/05/2017   Procedure: TOTAL HIP ARTHROPLASTY ANTERIOR APPROACH;  Surgeon: Gean Birchwood, MD;  Location: [facility];  Service: Orthopedics;  Laterality: Right;    SOCIAL HISTORY: Social History   Socioeconomic History   Marital status: Married    Spouse name: Not on file   Number of children: Not on file   Years of education: Not on file   Highest education level: Not on file  Occupational History   Occupation: retired Child psychotherapist  Tobacco Use   Smoking status: Former    Current packs/day: 0.00    Types: Cigarettes    Quit date: 04/14/1972    Years since quitting: 50.7   Smokeless tobacco: Never  Vaping Use   Vaping status: Never Used  Substance and Sexual Activity   Alcohol use: Yes    Comment: occasional   Drug use: Not Currently   Sexual activity: Yes    Birth control/protection: Surgical  Other Topics Concern   Not on file  Social History Narrative   Not on file   Social Determinants of Health   Financial Resource Strain: Not on file  Food Insecurity: No Food Insecurity (01/06/2023)   Hunger Vital Sign    Worried About Running Out of Food in the Last Year: Never true    Ran Out of Food in the Last Year: Never true  Transportation Needs: No Transportation Needs (01/06/2023)   PRAPARE - Administrator, Civil Service (Medical): No    Lack of Transportation (Non-Medical): No  Physical Activity: Not on file  Stress: Not on file  Social Connections: Not on file  Intimate Partner  Violence: Not At Risk (01/06/2023)   Humiliation, Afraid, Rape, and Kick questionnaire    Fear of Current or Ex-Partner: No    Emotionally Abused: No    Physically Abused: No    Sexually Abused: No    FAMILY HISTORY: Family History  Problem Relation Age of Onset   Breast cancer  Cousin        maternal first cousin   Uterine cancer Cousin        maternal first cousin    Review of Systems  Constitutional:  Positive for fatigue. Negative for appetite change, chills, fever and unexpected weight change.  HENT:   Negative for hearing loss, lump/mass and trouble swallowing.   Eyes:  Negative for eye problems and icterus.  Respiratory:  Negative for chest tightness, cough and shortness of breath.   Cardiovascular:  Negative for chest pain, leg swelling and palpitations.  Gastrointestinal:  Positive for constipation. Negative for abdominal distention, abdominal pain, diarrhea, nausea and vomiting.  Endocrine: Negative for hot flashes.  Genitourinary:  Negative for difficulty urinating.   Musculoskeletal:  Negative for arthralgias.  Skin:  Negative for itching and rash.  Neurological:  Negative for dizziness, extremity weakness, headaches and numbness.  Hematological:  Negative for adenopathy. Does not bruise/bleed easily.  Psychiatric/Behavioral:  Negative for depression. The patient is not nervous/anxious.       PHYSICAL EXAMINATION   Vitals:   01/14/23 1030  BP: (!) 158/68  Pulse: 87  Resp: 20  Temp: (!) 97.5 F (36.4 C)  SpO2: 100%    Physical Exam Constitutional:      General: Tami Schmidt is not in acute distress.    Appearance: Normal appearance. Tami Schmidt is not toxic-appearing.  HENT:     Head: Normocephalic and atraumatic.     Mouth/Throat:     Mouth: Mucous membranes are moist.     Pharynx: Oropharynx is clear. No oropharyngeal exudate or posterior oropharyngeal erythema.  Eyes:     General: No scleral icterus. Cardiovascular:     Rate and Rhythm: Normal rate and regular  rhythm.     Heart sounds: Normal heart sounds.  Pulmonary:     Effort: Pulmonary effort is normal.     Breath sounds: Normal breath sounds.  Musculoskeletal:        General: Swelling (LUE lymphedema has significantly improved) present.     Cervical back: Neck supple.  Lymphadenopathy:     Cervical: No cervical adenopathy.  Skin:    General: Skin is warm and dry.     Findings: No rash.  Neurological:     General: No focal deficit present.     Mental Status: Tami Schmidt is alert.  Psychiatric:        Mood and Affect: Mood normal.        Behavior: Behavior normal.     LABORATORY DATA:  CBC    Component Value Date/Time   WBC 7.9 01/14/2023 1012   WBC 6.2 01/09/2023 1615   RBC 3.33 (L) 01/14/2023 1106   RBC 3.33 (L) 01/14/2023 1012   HGB 8.8 (L) 01/14/2023 1012   HGB 10.5 (L) 01/20/2017 1057   HCT 26.9 (L) 01/14/2023 1012   HCT 25.8 (L) 01/05/2018 0513   HCT 32.8 (L) 01/20/2017 1057   PLT 212 01/14/2023 1012   PLT 368 01/20/2017 1057   MCV 80.8 01/14/2023 1012   MCV 77.8 (L) 01/20/2017 1057   MCH 26.4 01/14/2023 1012   MCHC 32.7 01/14/2023 1012   RDW 17.6 (H) 01/14/2023 1012   RDW 16.0 (H) 01/20/2017 1057   LYMPHSABS 2.0 01/14/2023 1012   LYMPHSABS 1.3 01/20/2017 1057   MONOABS 0.5 01/14/2023 1012   MONOABS 0.4 01/20/2017 1057   EOSABS 0.2 01/14/2023 1012   EOSABS 0.1 01/20/2017 1057   BASOSABS 0.1 01/14/2023 1012   BASOSABS 0.1 01/20/2017 1057    CMP  Component Value Date/Time   NA 140 01/14/2023 1012   NA 139 01/20/2017 1057   K 3.7 01/14/2023 1012   K 3.9 01/20/2017 1057   CL 107 01/14/2023 1012   CO2 26 01/14/2023 1012   CO2 25 01/20/2017 1057   GLUCOSE 207 (H) 01/14/2023 1012   GLUCOSE 135 01/20/2017 1057   BUN 13 01/14/2023 1012   BUN 15.6 01/20/2017 1057   CREATININE 0.75 01/14/2023 1012   CREATININE 0.7 01/20/2017 1057   CALCIUM 8.9 01/14/2023 1012   CALCIUM 9.3 01/20/2017 1057   PROT 6.6 01/14/2023 1012   PROT 7.1 01/20/2017 1057   ALBUMIN 3.6  01/14/2023 1012   ALBUMIN 3.4 (L) 01/20/2017 1057   AST 24 01/14/2023 1012   AST 10 01/20/2017 1057   ALT 14 01/14/2023 1012   ALT 14 01/20/2017 1057   ALKPHOS 60 01/14/2023 1012   ALKPHOS 69 01/20/2017 1057   BILITOT 0.5 01/14/2023 1012   BILITOT 0.43 01/20/2017 1057   GFRNONAA >60 01/14/2023 1012   GFRAA >60 07/14/2019 0538      ASSESSMENT and THERAPY PLAN:  Tami Schmidt is a 89 y.o. female who presents today for a follow-up of her triple positive breast cancer recurrence in her left axilla, subpectoral, and supraclavicular lymph nodes.   #Recurrent triple positive breast cancer: --Currently on second line Kadycla, tolerating very well --Labs today show anemia with Hgb 8.8., MCV 80.8. WBC 7.9, Plt normal at 212K, creatinine and LFTs normal. --Proceed with treatment today without any dose modifications. --Next echocardigam scheduled for 02/10/2023.   --RTC in 3 weeks with labs and follow up before next dose of Kaycla.  #Normocytic anemia: --Recently hospitalized for acute GI bleeding likely diverticular. Received 2 units of PRBC. Hgb was 8.3 at discharge. --Plan to follow up with Dr. Bosie Clos with [facility] in November 2024.  --Iron panel today shows deficiency saturation 9%, ferritin 40.  --Will arrange for IV feraheme x 2 doses to bolster levels and try to avoid PO iron due to chronic constipation  #Constipation: --Advised to try OTC stool softeners such as Senakot-S.   #Left arm lymphedema: --Improving. Continue with physical therapy.   #Neuropathy: --Grade 1. Continue to monitor.    No problem-specific Assessment & Plan notes found for this encounter.    All questions were answered. The patient knows to call the clinic with any problems, questions or concerns. We can certainly see the patient much sooner if necessary.  I have spent a total of 30 minutes minutes of face-to-face and non-face-to-face time, preparing to see the patient, , performing a medically  appropriate examination, counseling and educating the patient, ordering medications/tests/procedures, referring and communicating with other health care professionals, documenting clinical information in the electronic health record, independently interpreting results and communicating results to the patient, and care coordination.   Georga Kaufmann PA-C Dept of Hematology and Oncology [facility] at [facility] Phone: [phone]

## 2023-01-14 NOTE — Patient Instructions (Signed)
[facility]  Discharge Instructions: Thank you for choosing [facility] to provide your oncology and hematology care.   If you have a lab appointment with the Cancer Center, please go directly to the Cancer Center and check in at the registration area.   Wear comfortable clothing and clothing appropriate for easy access to any Portacath or PICC line.   We strive to give you quality time with your provider. You may need to reschedule your appointment if you arrive late (15 or more minutes).  Arriving late affects you and other patients whose appointments are after yours.  Also, if you miss three or more appointments without notifying the office, you may be dismissed from the clinic at the provider's discretion.      For prescription refill requests, have your pharmacy contact our office and allow 72 hours for refills to be completed.    Today you received the following chemotherapy and/or immunotherapy agents: Kadcyla.       To help prevent nausea and vomiting after your treatment, we encourage you to take your nausea medication as directed.  BELOW ARE SYMPTOMS THAT SHOULD BE REPORTED IMMEDIATELY: *FEVER GREATER THAN 100.4 F (38 C) OR HIGHER *CHILLS OR SWEATING *NAUSEA AND VOMITING THAT IS NOT CONTROLLED WITH YOUR NAUSEA MEDICATION *UNUSUAL SHORTNESS OF BREATH *UNUSUAL BRUISING OR BLEEDING *URINARY PROBLEMS (pain or burning when urinating, or frequent urination) *BOWEL PROBLEMS (unusual diarrhea, constipation, pain near the anus) TENDERNESS IN MOUTH AND THROAT WITH OR WITHOUT PRESENCE OF ULCERS (sore throat, sores in mouth, or a toothache) UNUSUAL RASH, SWELLING OR PAIN  UNUSUAL VAGINAL DISCHARGE OR ITCHING   Items with * indicate a potential emergency and should be followed up as soon as possible or go to the Emergency Department if any problems should occur.  Please show the CHEMOTHERAPY ALERT CARD or IMMUNOTHERAPY ALERT CARD at  check-in to the Emergency Department and triage nurse.  Should you have questions after your visit or need to cancel or reschedule your appointment, please contact [facility]  Dept: [phone]  and follow the prompts.  Office hours are 8:00 a.m. to 4:30 p.m. Monday - Friday. Please note that voicemails left after 4:00 p.m. may not be returned until the following business day.  We are closed weekends and major holidays. You have access to a nurse at all times for urgent questions. Please call the main number to the clinic Dept: [phone] and follow the prompts.   For any non-urgent questions, you may also contact your provider using MyChart. We now offer e-Visits for anyone 18 and older to request care online for non-urgent symptoms. For details visit [email or web address].   Also download the MyChart app! Go to the app store, search "MyChart", open the app, select [facility], and log in with your MyChart username and password.   

## 2023-01-15 ENCOUNTER — Encounter: Payer: Self-pay | Admitting: Hematology and Oncology

## 2023-01-15 ENCOUNTER — Telehealth: Payer: Self-pay

## 2023-01-15 NOTE — Telephone Encounter (Signed)
-----   Message from Briant Cedar sent at 01/15/2023  2:44 PM EDT ----- Notify patient that iron levels are low and we will arrange for IV feraheme x 2 doses. ----- Message ----- From: Leory Plowman, Lab In [place] Sent: 01/14/2023  11:29 AM EDT To: Briant Cedar, PA-C

## 2023-01-15 NOTE — Telephone Encounter (Signed)
Pt advised of lab results and recommendations.  She is in agreement to receiving IV iron.

## 2023-02-05 ENCOUNTER — Encounter: Payer: Self-pay | Admitting: Hematology and Oncology

## 2023-02-05 ENCOUNTER — Inpatient Hospital Stay: Payer: Medicare Other | Admitting: Hematology and Oncology

## 2023-02-05 ENCOUNTER — Inpatient Hospital Stay: Payer: Medicare Other

## 2023-02-05 VITALS — BP 152/73 | HR 70 | Temp 97.6°F | Resp 18

## 2023-02-05 DIAGNOSIS — Z170 Estrogen receptor positive status [ER+]: Secondary | ICD-10-CM

## 2023-02-05 DIAGNOSIS — Z95828 Presence of other vascular implants and grafts: Secondary | ICD-10-CM

## 2023-02-05 DIAGNOSIS — C50919 Malignant neoplasm of unspecified site of unspecified female breast: Secondary | ICD-10-CM

## 2023-02-05 DIAGNOSIS — C50312 Malignant neoplasm of lower-inner quadrant of left female breast: Secondary | ICD-10-CM

## 2023-02-05 DIAGNOSIS — Z5112 Encounter for antineoplastic immunotherapy: Secondary | ICD-10-CM | POA: Diagnosis not present

## 2023-02-05 LAB — CMP (CANCER CENTER ONLY)
ALT: 20 U/L (ref 0–44)
AST: 32 U/L (ref 15–41)
Albumin: 3.5 g/dL (ref 3.5–5.0)
Alkaline Phosphatase: 60 U/L (ref 38–126)
Anion gap: 11 (ref 5–15)
BUN: 17 mg/dL (ref 8–23)
CO2: 25 mmol/L (ref 22–32)
Calcium: 9.5 mg/dL (ref 8.9–10.3)
Chloride: 104 mmol/L (ref 98–111)
Creatinine: 0.56 mg/dL (ref 0.44–1.00)
GFR, Estimated: >60 mL/min (ref >60)
Glucose, Bld: 146 mg/dL — ABNORMAL HIGH (ref 70–99)
Potassium: 3.8 mmol/L (ref 3.5–5.1)
Sodium: 140 mmol/L (ref 135–145)
Total Bilirubin: 0.7 mg/dL (ref 0.3–1.2)
Total Protein: 7.2 g/dL (ref 6.5–8.1)

## 2023-02-05 LAB — CBC WITH DIFFERENTIAL (CANCER CENTER ONLY)
Abs Immature Granulocytes: 0.02 10*3/uL (ref 0.00–0.07)
Basophils Absolute: 0.1 10*3/uL (ref 0.0–0.1)
Basophils Relative: 1 %
Eosinophils Absolute: 0.1 10*3/uL (ref 0.0–0.5)
Eosinophils Relative: 2 %
HCT: 32.5 % — ABNORMAL LOW (ref 36.0–46.0)
Hemoglobin: 10.4 g/dL — ABNORMAL LOW (ref 12.0–15.0)
Immature Granulocytes: 0 %
Lymphocytes Relative: 25 %
Lymphs Abs: 1.6 10*3/uL (ref 0.7–4.0)
MCH: 25.7 pg — ABNORMAL LOW (ref 26.0–34.0)
MCHC: 32 g/dL (ref 30.0–36.0)
MCV: 80.2 fL (ref 80.0–100.0)
Monocytes Absolute: 0.5 10*3/uL (ref 0.1–1.0)
Monocytes Relative: 7 %
Neutro Abs: 4.2 10*3/uL (ref 1.7–7.7)
Neutrophils Relative %: 65 %
Platelet Count: 298 10*3/uL (ref 150–400)
RBC: 4.05 MIL/uL (ref 3.87–5.11)
RDW: 17.9 % — ABNORMAL HIGH (ref 11.5–15.5)
WBC Count: 6.5 10*3/uL (ref 4.0–10.5)
nRBC: 0 % (ref 0.0–0.2)

## 2023-02-05 MED ORDER — SODIUM CHLORIDE 0.9 % IV SOLN: Freq: Once | INTRAVENOUS | Status: AC

## 2023-02-05 MED ORDER — ADO-TRASTUZUMAB EMTANSINE CHEMO INJECTION 160 MG
3.6000 mg/kg | Freq: Once | INTRAVENOUS | Status: AC
  Administered 2023-02-05: 320 mg via INTRAVENOUS
  Filled 2023-02-05: qty 16

## 2023-02-05 MED ORDER — SODIUM CHLORIDE 0.9% FLUSH
10.0000 mL | INTRAVENOUS | Status: DC | PRN
  Administered 2023-02-05: 10 mL

## 2023-02-05 MED ORDER — HEPARIN SOD (PORK) LOCK FLUSH 100 UNIT/ML IV SOLN
500.0000 [IU] | Freq: Once | INTRAVENOUS | Status: AC | PRN
  Administered 2023-02-05: 500 [IU]

## 2023-02-05 MED ORDER — PROCHLORPERAZINE MALEATE 10 MG PO TABS
10.0000 mg | ORAL_TABLET | Freq: Once | ORAL | Status: AC
  Administered 2023-02-05: 10 mg via ORAL
  Filled 2023-02-05: qty 1

## 2023-02-05 MED ORDER — DIPHENHYDRAMINE HCL 25 MG PO CAPS
50.0000 mg | ORAL_CAPSULE | Freq: Once | ORAL | Status: AC
  Administered 2023-02-05: 50 mg via ORAL
  Filled 2023-02-05: qty 2

## 2023-02-05 MED ORDER — AMLODIPINE BESYLATE 10 MG PO TABS: 10.0000 mg | ORAL_TABLET | Freq: Every day | ORAL | 0 refills | Status: DC

## 2023-02-05 MED ORDER — SODIUM CHLORIDE 0.9% FLUSH
10.0000 mL | Freq: Once | INTRAVENOUS | Status: AC
  Administered 2023-02-05: 10 mL

## 2023-02-05 MED ORDER — ACETAMINOPHEN 325 MG PO TABS
650.0000 mg | ORAL_TABLET | Freq: Once | ORAL | Status: AC
  Administered 2023-02-05: 650 mg via ORAL
  Filled 2023-02-05: qty 2

## 2023-02-05 NOTE — Patient Instructions (Signed)
[place] [facility] AT [facility]   Discharge Instructions: Thank you for choosing [facility] to provide your oncology and hematology care.   If you have a lab appointment with the Cancer Center, please go directly to the Cancer Center and check in at the registration area.   Wear comfortable clothing and clothing appropriate for easy access to any Portacath or PICC line.   We strive to give you quality time with your provider. You may need to reschedule your appointment if you arrive late (15 or more minutes).  Arriving late affects you and other patients whose appointments are after yours.  Also, if you miss three or more appointments without notifying the office, you may be dismissed from the clinic at the provider's discretion.      For prescription refill requests, have your pharmacy contact our office and allow 72 hours for refills to be completed.    Today you received the following chemotherapy and/or immunotherapy agents: Ado-trastuzumab Emtansine (Kadcyla)      To help prevent nausea and vomiting after your treatment, we encourage you to take your nausea medication as directed.  BELOW ARE SYMPTOMS THAT SHOULD BE REPORTED IMMEDIATELY: *FEVER GREATER THAN 100.4 F (38 C) OR HIGHER *CHILLS OR SWEATING *NAUSEA AND VOMITING THAT IS NOT CONTROLLED WITH YOUR NAUSEA MEDICATION *UNUSUAL SHORTNESS OF BREATH *UNUSUAL BRUISING OR BLEEDING *URINARY PROBLEMS (pain or burning when urinating, or frequent urination) *BOWEL PROBLEMS (unusual diarrhea, constipation, pain near the anus) TENDERNESS IN MOUTH AND THROAT WITH OR WITHOUT PRESENCE OF ULCERS (sore throat, sores in mouth, or a toothache) UNUSUAL RASH, SWELLING OR PAIN  UNUSUAL VAGINAL DISCHARGE OR ITCHING   Items with * indicate a potential emergency and should be followed up as soon as possible or go to the Emergency Department if any problems should occur.  Please show the CHEMOTHERAPY ALERT CARD or  IMMUNOTHERAPY ALERT CARD at check-in to the Emergency Department and triage nurse.  Should you have questions after your visit or need to cancel or reschedule your appointment, please contact [facility]  Dept: [phone]  and follow the prompts.  Office hours are 8:00 a.m. to 4:30 p.m. Monday - Friday. Please note that voicemails left after 4:00 p.m. may not be returned until the following business day.  We are closed weekends and major holidays. You have access to a nurse at all times for urgent questions. Please call the main number to the clinic Dept: [phone] and follow the prompts.   For any non-urgent questions, you may also contact your provider using MyChart. We now offer e-Visits for anyone 66 and older to request care online for non-urgent symptoms. For details visit [email or web address].   Also download the MyChart app! Go to the app store, search "MyChart", open the app, select [place], and log in with your MyChart username and password.

## 2023-02-05 NOTE — Progress Notes (Signed)
[facility] Cancer Follow up:    System, Provider Not In No address on file   DIAGNOSIS:  Cancer Staging  Chest wall recurrence of breast cancer [facility]) Staging form: Breast, AJCC 7th Edition - Clinical: Stage Unknown (TX, N1, M0) - Signed by Lowella Dell, MD on 07/27/2014  Malignant neoplasm of lower-inner quadrant of left breast in female, estrogen receptor positive (HCC) Staging form: Breast, AJCC 7th Edition - Clinical: Stage IA (T1b, N0, M0) - Signed by Lowella Dell, MD on 07/27/2014   SUMMARY OF ONCOLOGIC HISTORY: (1) status post right lumpectomy and axillary dissection in 1993 in [place] for what appears to have been a stage I breast cancer, treated with radiation and then tamoxifen for 5 years   (2) status post right mastectomy 2002 for a recurrence in the right breast, treated adjuvantly with tamoxifen for an additional 5 years   (3) status post left mastectomy and sentinel lymph node sampling 03/07/2003 for a lower inner quadrant pT1b, pN0, stage IA mucinous breast cancer, grade 2; no radiation therapy was given. Tamoxifen was continued until 2009   RECURRENT DISEASE/ LEFT: OCTOBER 2014 (4) excisional biopsy of a left chest wall mass 01/26/2013 showing carcinoma consistent with invasive ductal carcinoma, estrogen receptor 100% positive, progesterone receptor 0% positive, with an MIB-1 of 22% and no HER-2 amplification.    (5)  PET scan on 03/03/2013 confirmed extensive metastatic adenopathy in the left axilla, left subpectoral musculature, and left supraclavicular region   (6)  started on anastrozole daily beginning 03/01/2013, interrupted during radiation therapy, resumed March 2015, discontinued March 2016 because of fatigue and arthralgias   (7) radiation therapy 03/31/2013 through 05/23/2013 Site/dose:   The patient was treated initially with a forward treatment planning technique to the left chest wall in addition to treatment to the  supraclavicular region. This consisted of a 3-D conformal technique. The patient was treated in this fashion to a dose of 50.4 gray. The patient then received a 14 gray boost treatment using an electron field. The total dose was 64.4 gray.   (8) Osteopenia, zometa yearly started 07/27/2014, but poorly tolerated.              (a) bone density scan on 12/27/13 showed a t-score of -1.8              (b) started Denosumab/Prolia April 2017, canceled after one dose per patient   (9) tamoxifen started 07/27/2014, stopped November 2019 (had total 5 years of antiestrogens)   (10) likely thalassemia trait   (11) left subpectoral soft tissue nodule noted 07/17/2015 unchanged through serial scans, most recent 02/09/2018, with no metabolic activity noted in PET scan of 2016 and interval calcification.  This is presumed benign   (12)  ADDITIONAL REGIONAL RECURRENCE:             (A) CT of the chest with contrast 03/05/2021 obtained to evaluate left upper extremity lymphedema as showed acute pulmonary emboli.  There was also left axillary and subpectoral lymphadenopathy             (B) rivaroxaban started 03/05/2021             (C) left breast ultrasound 03/29/2021 shows 2 areas in the infraclavicular tissue consistent with fat necrosis.  There were however multiple retropectoral interpectoral and left axillary masses.             (D) left axillary lymph node biopsy 04/04/2021: IDC grade 3, ER positive, 100%, strong staining  intensity, PR positive, 20%, weak staining intensity, Ki-67 of 15%, HER2 positive by IHC, ratio 2.90             (E) Fulvestrant every 4 weeks beginning 04/18/2021             (F) Herceptin every 3 weeks beginning 05/30/2021             Mild progression noted on 12/12 CT chest/abd/pelvis             (G) Kadcyla every 3 weeks beginning 05/01/2022  CURRENT THERAPY: Kadcyla  INTERVAL HISTORY: Tami Schmidt 89 y.o. female returns for follow-up on treatment with Kadcyla. She was last seen by  Dr. Al Pimple on 12/25/2022. In the interim, she was admitted from 01/05/2023-01/08/2023 for acute lower GI bleeding. She receiving 2 units of PRBC on 01/07/2023.   [name] reports that she still as some fatigue  and not just feeling well overall. She was wondering if she can take a break from the treatment but at the same time she says he doesn't want a break. She also wonders if we can refill her amlodipine for 3 months and if we can send [facility] to [facility] because of limp on right knee. She only has numbness in the little finger and ring finger of her left hand, no worsening of neuropathy. Back pain has been bothering her She is using a stool softener because she is constipated, but she wonders if we can give her a stronger medication. Rest of the pertinent 10 point ROS reviewed and neg.   Patient Active Problem List   Diagnosis Date Noted   Acute lower GI bleeding 01/07/2023   Rectal bleeding 01/05/2023   Acute GI bleeding 10/04/2022   Port-A-Cath in place 06/12/2022   Loss of perception for taste 03/17/2022   Primary osteoarthritis involving multiple joints 03/17/2022   Genetic testing 07/24/2021   Microcytosis 04/29/2021   Hypokalemia 04/28/2021   Pulmonary embolism (HCC)    Agnosia 08/09/2020   Hardening of the aorta (main artery of the heart) (HCC) 08/09/2020   Morbid obesity (HCC) 08/09/2020   Neuropathy 08/09/2020   Primary osteoarthritis 08/09/2020   Sensorineural hearing loss (SNHL) of both ears 08/07/2020   Anosmia 06/05/2020   Tinnitus, bilateral 06/05/2020   GI bleed 07/13/2019   ABLA (acute blood loss anemia) 07/12/2019   Microcytic anemia 01/05/2018   Melena 01/04/2018   Lower GI bleed 01/04/2018   Diabetes mellitus type II, controlled, with no complications (HCC) 12/05/2017   Tenosynovitis of left wrist 12/02/2017   Infection of left wrist (HCC) 11/30/2017   Primary osteoarthritis of right hip 01/05/2017   Osteoarthritis of right hip 01/03/2017    Lymphedema of upper extremity 01/17/2014   Osteopenia 01/17/2014   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   Dilated pore of [place] 08/02/2013   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   Diabetes mellitus    fasting 90-100   HTN (hypertension) 03/25/2013   Hx of radiation therapy    breasts hx   Hypercholesterolemia    Hypertension    Lymphedema of arm    left arm   Pulmonary embolism (HCC)    Type II or unspecified type diabetes mellitus without mention of complication, not stated as uncontrolled 03/25/2013    SURGICAL HISTORY: Past Surgical History:  Procedure Laterality Date   ABDOMINAL HYSTERECTOMY     ANKLE SURGERY Right 1995   APPENDECTOMY     BIOPSY  01/07/2018   Procedure: BIOPSY;  Surgeon: Kerin Salen, MD;  Location: [facility];  Service: Gastroenterology;;   BREAST SURGERY Bilateral    mastectomy   COLONOSCOPY WITH PROPOFOL N/A 01/07/2018   Procedure: COLONOSCOPY WITH PROPOFOL;  Surgeon: Kerin Salen, MD;  Location: [facility];  Service: Gastroenterology;  Laterality: N/A;   ESOPHAGOGASTRODUODENOSCOPY (EGD) WITH PROPOFOL N/A 01/06/2018   Procedure: ESOPHAGOGASTRODUODENOSCOPY (EGD) WITH PROPOFOL;  Surgeon: Kerin Salen, MD;  Location: [facility];  Service:  Gastroenterology;  Laterality: N/A;   HERNIA REPAIR  04-26-2010   IR IMAGING GUIDED PORT INSERTION  10/29/2021   MASS EXCISION Left 01/26/2013   Procedure: EXCISION LEFT CHEST WALL MASS AND LEFT ABDOMNAL WALL MASS;  Surgeon: [name], MD;  Location: [facility];  Service: General;  Laterality: Left;   MASS EXCISION Left 09/20/2014   Procedure: EXCISION OF LEFT CHEST WALL MASS;  Surgeon: [name], MD;  Location: [facility];  Service: General;  Laterality: Left;   TEE WITHOUT CARDIOVERSION N/A 12/08/2017   Procedure: TRANSESOPHAGEAL ECHOCARDIOGRAM (TEE);  Surgeon: Lewayne Bunting, MD;  Location: [facility];  Service: Cardiovascular;  Laterality: N/A;   TOTAL HIP ARTHROPLASTY Right 01/05/2017   TOTAL HIP ARTHROPLASTY Right 01/05/2017   Procedure: TOTAL HIP ARTHROPLASTY ANTERIOR APPROACH;  Surgeon: Gean Birchwood, MD;  Location: [facility];  Service: Orthopedics;  Laterality: Right;    SOCIAL HISTORY: Social History   Socioeconomic History   Marital status: Married    Spouse name: Not on file   Number of children: Not on file   Years of education: Not on file   Highest education level: Not on file  Occupational History   Occupation: retired Child psychotherapist  Tobacco Use   Smoking status: Former    Current packs/day: 0.00    Types: Cigarettes    Quit date: 04/14/1972    Years since quitting: 50.8   Smokeless tobacco: Never  Vaping Use   Vaping status: Never Used  Substance and Sexual Activity   Alcohol use: Yes    Comment: occasional   Drug use: Not Currently   Sexual activity: Yes    Birth control/protection: Surgical  Other Topics Concern   Not on file  Social History Narrative   Not on file   Social Determinants of Health   Financial Resource Strain: Not on file  Food Insecurity: No Food Insecurity (01/06/2023)   Hunger Vital Sign    Worried About Running Out of Food in the Last Year: Never true    Ran Out of Food in the Last Year: Never true   Transportation Needs: No Transportation Needs (01/06/2023)   PRAPARE - Administrator, Civil Service (Medical): No    Lack of Transportation (Non-Medical): No  Physical Activity: Not on file  Stress: Not on file  Social Connections: Not on file  Intimate Partner Violence:  Not At Risk (01/06/2023)   Humiliation, Afraid, Rape, and Kick questionnaire    Fear of Current or Ex-Partner: No    Emotionally Abused: No    Physically Abused: No    Sexually Abused: No    FAMILY HISTORY: Family History  Problem Relation Age of Onset   Breast cancer Cousin        maternal first cousin   Uterine cancer Cousin        maternal first cousin    Review of Systems  Constitutional:  Positive for fatigue. Negative for appetite change, chills, fever and unexpected weight change.  HENT:   Negative for hearing loss, lump/mass and trouble swallowing.   Eyes:  Negative for eye problems and icterus.  Respiratory:  Negative for chest tightness, cough and shortness of breath.   Cardiovascular:  Negative for chest pain, leg swelling and palpitations.  Gastrointestinal:  Positive for constipation. Negative for abdominal distention, abdominal pain, diarrhea, nausea and vomiting.  Endocrine: Negative for hot flashes.  Genitourinary:  Negative for difficulty urinating.   Musculoskeletal:  Negative for arthralgias.  Skin:  Negative for itching and rash.  Neurological:  Negative for dizziness, extremity weakness, headaches and numbness.  Hematological:  Negative for adenopathy. Does not bruise/bleed easily.  Psychiatric/Behavioral:  Negative for depression. The patient is not nervous/anxious.       PHYSICAL EXAMINATION   Vitals:   02/05/23 1221  BP: (!) 159/75  Pulse: 89  Resp: 18  Temp: (!) 97.5 F (36.4 C)  SpO2: 100%    Physical Exam Constitutional:      General: She is not in acute distress.    Appearance: Normal appearance. She is not toxic-appearing.  HENT:     Head:  Normocephalic and atraumatic.     Mouth/Throat:     Mouth: Mucous membranes are moist.     Pharynx: Oropharynx is clear. No oropharyngeal exudate or posterior oropharyngeal erythema.  Eyes:     General: No scleral icterus. Cardiovascular:     Rate and Rhythm: Normal rate and regular rhythm.     Heart sounds: Normal heart sounds.  Pulmonary:     Effort: Pulmonary effort is normal.     Breath sounds: Normal breath sounds.  Musculoskeletal:        General: Swelling (LUE lymphedema has significantly improved) present.     Cervical back: Neck supple.  Lymphadenopathy:     Cervical: No cervical adenopathy.  Skin:    General: Skin is warm and dry.     Findings: No rash.  Neurological:     General: No focal deficit present.     Mental Status: She is alert.  Psychiatric:        Mood and Affect: Mood normal.        Behavior: Behavior normal.     LABORATORY DATA:  CBC    Component Value Date/Time   WBC 6.5 02/05/2023 1135   WBC 6.2 01/09/2023 1615   RBC 4.05 02/05/2023 1135   HGB 10.4 (L) 02/05/2023 1135   HGB 10.5 (L) 01/20/2017 1057   HCT 32.5 (L) 02/05/2023 1135   HCT 25.8 (L) 01/05/2018 0513   HCT 32.8 (L) 01/20/2017 1057   PLT 298 02/05/2023 1135   PLT 368 01/20/2017 1057   MCV 80.2 02/05/2023 1135   MCV 77.8 (L) 01/20/2017 1057   MCH 25.7 (L) 02/05/2023 1135   MCHC 32.0 02/05/2023 1135   RDW 17.9 (H) 02/05/2023 1135   RDW 16.0 (H) 01/20/2017 1057   LYMPHSABS  1.6 02/05/2023 1135   LYMPHSABS 1.3 01/20/2017 1057   MONOABS 0.5 02/05/2023 1135   MONOABS 0.4 01/20/2017 1057   EOSABS 0.1 02/05/2023 1135   EOSABS 0.1 01/20/2017 1057   BASOSABS 0.1 02/05/2023 1135   BASOSABS 0.1 01/20/2017 1057    CMP     Component Value Date/Time   NA 140 01/14/2023 1012   NA 139 01/20/2017 1057   K 3.7 01/14/2023 1012   K 3.9 01/20/2017 1057   CL 107 01/14/2023 1012   CO2 26 01/14/2023 1012   CO2 25 01/20/2017 1057   GLUCOSE 207 (H) 01/14/2023 1012   GLUCOSE 135 01/20/2017  1057   BUN 13 01/14/2023 1012   BUN 15.6 01/20/2017 1057   CREATININE 0.75 01/14/2023 1012   CREATININE 0.7 01/20/2017 1057   CALCIUM 8.9 01/14/2023 1012   CALCIUM 9.3 01/20/2017 1057   PROT 6.6 01/14/2023 1012   PROT 7.1 01/20/2017 1057   ALBUMIN 3.6 01/14/2023 1012   ALBUMIN 3.4 (L) 01/20/2017 1057   AST 24 01/14/2023 1012   AST 10 01/20/2017 1057   ALT 14 01/14/2023 1012   ALT 14 01/20/2017 1057   ALKPHOS 60 01/14/2023 1012   ALKPHOS 69 01/20/2017 1057   BILITOT 0.5 01/14/2023 1012   BILITOT 0.43 01/20/2017 1057   GFRNONAA >60 01/14/2023 1012   GFRAA >60 07/14/2019 0538      ASSESSMENT and THERAPY PLAN:  Tami Schmidt is a 89 y.o. female who presents today for a follow-up of her triple positive breast cancer recurrence in her left axilla, subpectoral, and supraclavicular lymph nodes.   #Recurrent triple positive breast cancer: --Currently on second line Kadycla, tolerating very well --Labs today show anemia with hemoglobin over 10 g/dL.  CMP pending at the time of my visit I have offered to give her a break if she is very tired of the treatment but she wanted to proceed hence we will proceed with the same treatment dosage --RTC in 3 weeks with labs and follow up before next dose of Kaycla. -Last imaging in September with stable disease -Echocardiogram scheduled for the end of October, will repeat imaging again in December.  #Normocytic anemia: --Recently hospitalized for acute GI bleeding likely diverticular. Received 2 units of PRBC. Hgb was 8.3 at discharge. --Continue oral iron supplementation.  Today's hemoglobin is over 10 g.  #Constipation: --Advised to try OTC stool softeners such as Senakot-S and as needed laxatives  #Left arm lymphedema: --Improving. Continue with physical therapy.   #Neuropathy: --Grade 1. Continue to monitor.    No problem-specific Assessment & Plan notes found for this encounter.    All questions were answered. The patient knows  to call the clinic with any problems, questions or concerns. We can certainly see the patient much sooner if necessary.  I have spent a total of 30 minutes minutes of face-to-face and non-face-to-face time, preparing to see the patient, , performing a medically appropriate examination, counseling and educating the patient, ordering medications/tests/procedures, referring and communicating with other health care professionals, documenting clinical information in the electronic health record, independently interpreting results and communicating results to the patient, and care coordination.   Georga Kaufmann PA-C Dept of Hematology and Oncology [facility] at [facility] Phone: [phone]

## 2023-02-10 ENCOUNTER — Ambulatory Visit (HOSPITAL_COMMUNITY)
Admission: RE | Admit: 2023-02-10 | Discharge: 2023-02-10 | Disposition: A | Discharge status: Home / Self Care | Payer: Medicare Other | Source: Ambulatory Visit | Attending: Hematology and Oncology | Admitting: Hematology and Oncology

## 2023-02-10 DIAGNOSIS — C50312 Malignant neoplasm of lower-inner quadrant of left female breast: Secondary | ICD-10-CM | POA: Diagnosis present

## 2023-02-10 DIAGNOSIS — Z170 Estrogen receptor positive status [ER+]: Secondary | ICD-10-CM

## 2023-02-10 DIAGNOSIS — Z0189 Encounter for other specified special examinations: Secondary | ICD-10-CM | POA: Diagnosis not present

## 2023-02-10 DIAGNOSIS — C50919 Malignant neoplasm of unspecified site of unspecified female breast: Secondary | ICD-10-CM | POA: Insufficient documentation

## 2023-02-10 DIAGNOSIS — I2699 Other pulmonary embolism without acute cor pulmonale: Secondary | ICD-10-CM | POA: Insufficient documentation

## 2023-02-10 DIAGNOSIS — E119 Type 2 diabetes mellitus without complications: Secondary | ICD-10-CM | POA: Diagnosis not present

## 2023-02-10 DIAGNOSIS — I10 Essential (primary) hypertension: Secondary | ICD-10-CM | POA: Insufficient documentation

## 2023-02-10 LAB — ECHOCARDIOGRAM COMPLETE
AR max vel: 2.02 cm2
AV Area VTI: 1.98 cm2
AV Area mean vel: 2.06 cm2
AV Mean grad: 3 mm[Hg]
AV Peak grad: 6.4 mm[Hg]
Ao pk vel: 1.26 m/s
Area-P 1/2: 3.72 cm2

## 2023-02-10 NOTE — Progress Notes (Signed)
*  PRELIMINARY RESULTS* Echocardiogram 2D Echocardiogram has been performed.  Tami Schmidt 02/10/2023, 1:50 PM

## 2023-02-13 ENCOUNTER — Telehealth: Payer: Self-pay

## 2023-02-13 NOTE — Telephone Encounter (Signed)
Transition Care Management Unsuccessful Follow-up Telephone Call  Date of discharge and from where:  [facility] 9/27  Attempts:  1st Attempt  Reason for unsuccessful TCM follow-up call:  No answer/busy   Lenard Forth [place]  [facility], [facility] Guide, Phone: [phone] Website: [email or web address]

## 2023-02-16 ENCOUNTER — Telehealth: Payer: Self-pay

## 2023-02-16 NOTE — Telephone Encounter (Signed)
Transition Care Management Unsuccessful Follow-up Telephone Call  Date of discharge and from where:  [facility]  Attempts:  2nd Attempt  Reason for unsuccessful TCM follow-up call:  No answer/busy   Lenard Forth [place]  [facility], [facility] Guide, Phone: [phone] Website: [email or web address]

## 2023-02-18 ENCOUNTER — Ambulatory Visit (INDEPENDENT_AMBULATORY_CARE_PROVIDER_SITE_OTHER): Payer: Medicare Other | Admitting: Podiatry

## 2023-02-18 DIAGNOSIS — L600 Ingrowing nail: Secondary | ICD-10-CM

## 2023-02-18 DIAGNOSIS — B351 Tinea unguium: Secondary | ICD-10-CM

## 2023-02-18 DIAGNOSIS — M79674 Pain in right toe(s): Secondary | ICD-10-CM

## 2023-02-18 DIAGNOSIS — I739 Peripheral vascular disease, unspecified: Secondary | ICD-10-CM | POA: Diagnosis not present

## 2023-02-18 DIAGNOSIS — E1142 Type 2 diabetes mellitus with diabetic polyneuropathy: Secondary | ICD-10-CM

## 2023-02-18 DIAGNOSIS — L84 Corns and callosities: Secondary | ICD-10-CM | POA: Diagnosis not present

## 2023-02-18 DIAGNOSIS — M79675 Pain in left toe(s): Secondary | ICD-10-CM | POA: Diagnosis not present

## 2023-02-19 ENCOUNTER — Other Ambulatory Visit: Payer: Self-pay

## 2023-02-20 ENCOUNTER — Encounter: Payer: Self-pay | Admitting: Podiatry

## 2023-02-20 NOTE — Progress Notes (Signed)
  Subjective:  Patient ID: Tami Schmidt, female    DOB: 1932/09/01,  MRN: [id number]  Tami Schmidt presents to clinic today for at risk footcare. Patient has h/o diabetes, neuropathy and PAD and is seen for  and callus(es) right foot and painful thick toenails that are difficult to trim. Painful toenails interfere with ambulation. Aggravating factors include wearing enclosed shoe gear. Pain is relieved with periodic professional debridement. Painful calluses are aggravated when weightbearing with and without shoegear. Pain is relieved with periodic professional debridement.  Chief Complaint  Patient presents with   Diabetes    [facility] BS - 104 A1C -6 LVPCP - 01/2023   New problem(s): None.   PCP is Pcp, No.  Allergies  Allergen Reactions   Bactrim Swelling    SWELLING OF MOUTH/FACE.   Lisinopril Swelling    SWELLING OF MOUTH/FACE.   Vasotec Swelling    SWELLING OF MOUTH/FACE.   Sulfamethoxazole-Trimethoprim     Other reaction(s): Unknown    Review of Systems: Negative except as noted in the HPI.  Objective: There were no vitals filed for this visit. Tami Schmidt is a pleasant 89 y.o. female in NAD. AAO x 3.  Vascular Examination: CFT <3 seconds b/l. DP pulses faintly palpable b/l. PT pulses nonpalpable b/l. Digital hair absent. Skin temperature gradient warm to warm b/l. No pain with calf compression. No ischemia or gangrene. No cyanosis or clubbing noted b/l.    Neurological Examination: Sensation grossly intact b/l with 10 gram monofilament. Vibratory sensation intact b/l.   Dermatological Examination: Pedal skin warm and supple b/l. No open wounds b/l. No interdigital macerations. Toenails 1-5 b/l thick, discolored, elongated with subungual debris and pain on dorsal palpation.    Incurvated nailplate both borders of left hallux and lateral border right hallux with tenderness to palpation. No erythema, no edema, no drainage noted. No fluctuance. She does have  postinflammatory hyperpigmentation of lateral border right great toe.  Hyperkeratotic lesion(s) submet head 5 right foot, and sub 5th met base right lower extremity.  No erythema, no edema, no drainage, no fluctuance.   Musculoskeletal Examination: Muscle strength 5/5 to all lower extremity muscle groups bilaterally. HAV with bunion bilaterally and hammertoes 2-5 b/l. Pes planus deformity noted bilateral LE.  Radiographs: None  Assessment/Plan: 1. Pain due to onychomycosis of toenails of both feet   2. Ingrown toenail without infection   3. Callus   4. PVD (peripheral vascular disease) (HCC)   5. Diabetic peripheral neuropathy associated with type 2 diabetes mellitus (HCC)     -Consent given for treatment as described below: -Examined patient. -Continue foot and shoe inspections daily. Monitor blood glucose per PCP/Endocrinologist's recommendations. -Patient to continue soft, supportive shoe gear daily. -Toenails were debrided in length and girth 2-5 bilaterally with sterile nail nippers and dremel without iatrogenic bleeding.  -No invasive procedure(s) performed. Offending nail border debrided and curretaged bilateral great toes utilizing sterile nail nipper and currette. Border(s) cleansed with alcohol and triple antibiotic ointment applied. Patient/POA/Caregiver/Facility instructed to apply Neosporin Cream  to bilateral great toes once daily for 7 days. Call office if there are any concerns. -Patient/POA to call should there be question/concern in the interim.   Return in about 3 weeks (around 03/11/2023).  Freddie Breech, DPM

## 2023-02-26 ENCOUNTER — Inpatient Hospital Stay: Payer: Medicare Other | Attending: Adult Health

## 2023-02-26 ENCOUNTER — Inpatient Hospital Stay: Payer: Medicare Other | Admitting: Hematology and Oncology

## 2023-02-26 ENCOUNTER — Inpatient Hospital Stay: Payer: Medicare Other

## 2023-02-26 VITALS — BP 174/72 | HR 95 | Temp 97.2°F | Resp 16 | Wt 171.0 lb

## 2023-02-26 DIAGNOSIS — C773 Secondary and unspecified malignant neoplasm of axilla and upper limb lymph nodes: Secondary | ICD-10-CM | POA: Insufficient documentation

## 2023-02-26 DIAGNOSIS — D649 Anemia, unspecified: Secondary | ICD-10-CM | POA: Insufficient documentation

## 2023-02-26 DIAGNOSIS — Z86711 Personal history of pulmonary embolism: Secondary | ICD-10-CM | POA: Diagnosis not present

## 2023-02-26 DIAGNOSIS — C50312 Malignant neoplasm of lower-inner quadrant of left female breast: Secondary | ICD-10-CM | POA: Insufficient documentation

## 2023-02-26 DIAGNOSIS — K5900 Constipation, unspecified: Secondary | ICD-10-CM | POA: Diagnosis not present

## 2023-02-26 DIAGNOSIS — Z5112 Encounter for antineoplastic immunotherapy: Secondary | ICD-10-CM | POA: Diagnosis present

## 2023-02-26 DIAGNOSIS — Z95828 Presence of other vascular implants and grafts: Secondary | ICD-10-CM

## 2023-02-26 DIAGNOSIS — Z170 Estrogen receptor positive status [ER+]: Secondary | ICD-10-CM | POA: Insufficient documentation

## 2023-02-26 DIAGNOSIS — M8580 Other specified disorders of bone density and structure, unspecified site: Secondary | ICD-10-CM | POA: Diagnosis not present

## 2023-02-26 DIAGNOSIS — C50919 Malignant neoplasm of unspecified site of unspecified female breast: Secondary | ICD-10-CM

## 2023-02-26 LAB — CBC WITH DIFFERENTIAL (CANCER CENTER ONLY)
Abs Immature Granulocytes: 0.01 10*3/uL (ref 0.00–0.07)
Basophils Absolute: 0 10*3/uL (ref 0.0–0.1)
Basophils Relative: 1 %
Eosinophils Absolute: 0.1 10*3/uL (ref 0.0–0.5)
Eosinophils Relative: 3 %
HCT: 35.2 % — ABNORMAL LOW (ref 36.0–46.0)
Hemoglobin: 10.9 g/dL — ABNORMAL LOW (ref 12.0–15.0)
Immature Granulocytes: 0 %
Lymphocytes Relative: 30 %
Lymphs Abs: 1.7 10*3/uL (ref 0.7–4.0)
MCH: 24.7 pg — ABNORMAL LOW (ref 26.0–34.0)
MCHC: 31 g/dL (ref 30.0–36.0)
MCV: 79.6 fL — ABNORMAL LOW (ref 80.0–100.0)
Monocytes Absolute: 0.4 10*3/uL (ref 0.1–1.0)
Monocytes Relative: 8 %
Neutro Abs: 3.3 10*3/uL (ref 1.7–7.7)
Neutrophils Relative %: 58 %
Platelet Count: 189 10*3/uL (ref 150–400)
RBC: 4.42 MIL/uL (ref 3.87–5.11)
RDW: 16.9 % — ABNORMAL HIGH (ref 11.5–15.5)
WBC Count: 5.5 10*3/uL (ref 4.0–10.5)
nRBC: 0 % (ref 0.0–0.2)

## 2023-02-26 LAB — CMP (CANCER CENTER ONLY)
ALT: 15 U/L (ref 0–44)
AST: 28 U/L (ref 15–41)
Albumin: 3.8 g/dL (ref 3.5–5.0)
Alkaline Phosphatase: 67 U/L (ref 38–126)
Anion gap: 7 (ref 5–15)
BUN: 17 mg/dL (ref 8–23)
CO2: 29 mmol/L (ref 22–32)
Calcium: 9.6 mg/dL (ref 8.9–10.3)
Chloride: 103 mmol/L (ref 98–111)
Creatinine: 0.68 mg/dL (ref 0.44–1.00)
GFR, Estimated: >60 mL/min (ref >60)
Glucose, Bld: 196 mg/dL — ABNORMAL HIGH (ref 70–99)
Potassium: 3.9 mmol/L (ref 3.5–5.1)
Sodium: 139 mmol/L (ref 135–145)
Total Bilirubin: 0.5 mg/dL (ref <1.2)
Total Protein: 7.5 g/dL (ref 6.5–8.1)

## 2023-02-26 MED ORDER — SODIUM CHLORIDE 0.9% FLUSH
10.0000 mL | Freq: Once | INTRAVENOUS | Status: AC
  Administered 2023-02-26: 10 mL

## 2023-02-26 MED ORDER — SODIUM CHLORIDE 0.9 % IV SOLN
3.6000 mg/kg | Freq: Once | INTRAVENOUS | Status: AC
  Administered 2023-02-26: 320 mg via INTRAVENOUS
  Filled 2023-02-26: qty 16

## 2023-02-26 MED ORDER — DIPHENHYDRAMINE HCL 25 MG PO CAPS
50.0000 mg | ORAL_CAPSULE | Freq: Once | ORAL | Status: AC
  Administered 2023-02-26: 50 mg via ORAL
  Filled 2023-02-26: qty 2

## 2023-02-26 MED ORDER — ACETAMINOPHEN 325 MG PO TABS
650.0000 mg | ORAL_TABLET | Freq: Once | ORAL | Status: AC
  Administered 2023-02-26: 650 mg via ORAL
  Filled 2023-02-26: qty 2

## 2023-02-26 MED ORDER — SODIUM CHLORIDE 0.9% FLUSH
10.0000 mL | INTRAVENOUS | Status: DC | PRN
  Administered 2023-02-26: 10 mL

## 2023-02-26 MED ORDER — SODIUM CHLORIDE 0.9 % IV SOLN: Freq: Once | INTRAVENOUS | Status: AC

## 2023-02-26 MED ORDER — HEPARIN SOD (PORK) LOCK FLUSH 100 UNIT/ML IV SOLN
500.0000 [IU] | Freq: Once | INTRAVENOUS | Status: AC | PRN
  Administered 2023-02-26: 500 [IU]

## 2023-02-26 MED ORDER — PROCHLORPERAZINE MALEATE 10 MG PO TABS
10.0000 mg | ORAL_TABLET | Freq: Once | ORAL | Status: AC
  Administered 2023-02-26: 10 mg via ORAL
  Filled 2023-02-26: qty 1

## 2023-02-26 NOTE — Progress Notes (Signed)
[facility] Cancer Follow up:    Pcp, No No address on file   DIAGNOSIS:  Cancer Staging  Chest wall recurrence of breast cancer (HCC) Staging form: Breast, AJCC 7th Edition - Clinical: Stage Unknown (TX, N1, M0) - Signed by Lowella Dell, MD on 07/27/2014  Malignant neoplasm of lower-inner quadrant of left breast in female, estrogen receptor positive (HCC) Staging form: Breast, AJCC 7th Edition - Clinical: Stage IA (T1b, N0, M0) - Signed by Lowella Dell, MD on 07/27/2014   SUMMARY OF ONCOLOGIC HISTORY: (1) status post right lumpectomy and axillary dissection in 1993 in [place] for what appears to have been a stage I breast cancer, treated with radiation and then tamoxifen for 5 years   (2) status post right mastectomy 2002 for a recurrence in the right breast, treated adjuvantly with tamoxifen for an additional 5 years   (3) status post left mastectomy and sentinel lymph node sampling 03/07/2003 for a lower inner quadrant pT1b, pN0, stage IA mucinous breast cancer, grade 2; no radiation therapy was given. Tamoxifen was continued until 2009   RECURRENT DISEASE/ LEFT: OCTOBER 2014 (4) excisional biopsy of a left chest wall mass 01/26/2013 showing carcinoma consistent with invasive ductal carcinoma, estrogen receptor 100% positive, progesterone receptor 0% positive, with an MIB-1 of 22% and no HER-2 amplification.    (5)  PET scan on 03/03/2013 confirmed extensive metastatic adenopathy in the left axilla, left subpectoral musculature, and left supraclavicular region   (6)  started on anastrozole daily beginning 03/01/2013, interrupted during radiation therapy, resumed March 2015, discontinued March 2016 because of fatigue and arthralgias   (7) radiation therapy 03/31/2013 through 05/23/2013 Site/dose:   The patient was treated initially with a forward treatment planning technique to the left chest wall in addition to treatment to the supraclavicular region. This  consisted of a 3-D conformal technique. The patient was treated in this fashion to a dose of 50.4 gray. The patient then received a 14 gray boost treatment using an electron field. The total dose was 64.4 gray.   (8) Osteopenia, zometa yearly started 07/27/2014, but poorly tolerated.              (a) bone density scan on 12/27/13 showed a t-score of -1.8              (b) started Denosumab/Prolia April 2017, canceled after one dose per patient   (9) tamoxifen started 07/27/2014, stopped November 2019 (had total 5 years of antiestrogens)   (10) likely thalassemia trait   (11) left subpectoral soft tissue nodule noted 07/17/2015 unchanged through serial scans, most recent 02/09/2018, with no metabolic activity noted in PET scan of 2016 and interval calcification.  This is presumed benign   (12)  ADDITIONAL REGIONAL RECURRENCE:             (A) CT of the chest with contrast 03/05/2021 obtained to evaluate left upper extremity lymphedema as showed acute pulmonary emboli.  There was also left axillary and subpectoral lymphadenopathy             (B) rivaroxaban started 03/05/2021             (C) left breast ultrasound 03/29/2021 shows 2 areas in the infraclavicular tissue consistent with fat necrosis.  There were however multiple retropectoral interpectoral and left axillary masses.             (D) left axillary lymph node biopsy 04/04/2021: IDC grade 3, ER positive, 100%, strong staining intensity, PR  positive, 20%, weak staining intensity, Ki-67 of 15%, HER2 positive by IHC, ratio 2.90             (E) Fulvestrant every 4 weeks beginning 04/18/2021             (F) Herceptin every 3 weeks beginning 05/30/2021             Mild progression noted on 12/12 CT chest/abd/pelvis             (G) Kadcyla every 3 weeks beginning 05/01/2022  CURRENT THERAPY: Kadcyla  INTERVAL HISTORY: Tami Schmidt 89 y.o. female returns for follow-up on treatment with Kadcyla.   Discussed the use of AI scribe software for  clinical note transcription with the patient, who gave verbal consent to proceed.  History of Present Illness    The patient, with a history of cancer, presents for a follow-up visit regarding her ongoing treatment with Kadcyla. She expresses concern about the duration of the treatment and inquires about her progress. The patient reports that her condition has improved, with the last scan showing a reduction in the size of her tumors. She denies any new side effects from the Kadcyla, but reports persistent fatigue and neuropathy, particularly numbness in the fingers. She also mentions an inability to taste and smell, which she finds particularly frustrating as it is 'cooking time.' However, she denies having had COVID-19.  The patient also reports constipation, which has improved with the use of a stool softener taken twice daily. She now has bowel movements twice a week. She also mentions swelling in her left hand, which varies from day to day but is not worsening. She has been taking iron supplements for anemia, which has improved with her hemoglobin now at 10.9, up from 7. She denies any bloody or black stools, attributing the dark color of her stools to the iron supplements.  Rest of the pertinent 10 point ROS reviewed and neg.   Patient Active Problem List   Diagnosis Date Noted   Acute lower GI bleeding 01/07/2023   Rectal bleeding 01/05/2023   Acute GI bleeding 10/04/2022   Port-A-Cath in place 06/12/2022   Loss of perception for taste 03/17/2022   Primary osteoarthritis involving multiple joints 03/17/2022   Genetic testing 07/24/2021   Microcytosis 04/29/2021   Hypokalemia 04/28/2021   Pulmonary embolism (HCC)    Agnosia 08/09/2020   Hardening of the aorta (main artery of the heart) (HCC) 08/09/2020   Morbid obesity (HCC) 08/09/2020   Neuropathy 08/09/2020   Primary osteoarthritis 08/09/2020   Sensorineural hearing loss (SNHL) of both ears 08/07/2020   Anosmia 06/05/2020    Tinnitus, bilateral 06/05/2020   GI bleed 07/13/2019   ABLA (acute blood loss anemia) 07/12/2019   Microcytic anemia 01/05/2018   Melena 01/04/2018   Lower GI bleed 01/04/2018   Diabetes mellitus type II, controlled, with no complications (HCC) 12/05/2017   Tenosynovitis of left wrist 12/02/2017   Infection of left wrist (HCC) 11/30/2017   Primary osteoarthritis of right hip 01/05/2017   Osteoarthritis of right hip 01/03/2017   Lymphedema of upper extremity 01/17/2014   Osteopenia 01/17/2014   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   Dilated pore of Winer 08/02/2013   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 08/02/2013   Scar 08/02/2013   Malignant neoplasm of lower-inner quadrant of left breast in female, estrogen receptor positive (HCC) 06/16/2013   Type II or unspecified type diabetes mellitus without mention of complication, not stated as uncontrolled  03/25/2013   Essential hypertension 03/25/2013   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   Diabetes mellitus    fasting 90-100   HTN (hypertension) 03/25/2013   Hx of radiation therapy    breasts hx   Hypercholesterolemia    Hypertension    Lymphedema of arm    left arm   Pulmonary embolism (HCC)    Type II or unspecified type diabetes mellitus without mention of complication, not stated as uncontrolled 03/25/2013    SURGICAL HISTORY: Past Surgical History:  Procedure Laterality Date   ABDOMINAL HYSTERECTOMY     ANKLE SURGERY Right 1995   APPENDECTOMY     BIOPSY  01/07/2018   Procedure: BIOPSY;  Surgeon: Kerin Salen, MD;  Location: [facility];  Service:  Gastroenterology;;   BREAST SURGERY Bilateral    mastectomy   COLONOSCOPY WITH PROPOFOL N/A 01/07/2018   Procedure: COLONOSCOPY WITH PROPOFOL;  Surgeon: Kerin Salen, MD;  Location: [facility];  Service: Gastroenterology;  Laterality: N/A;   ESOPHAGOGASTRODUODENOSCOPY (EGD) WITH PROPOFOL N/A 01/06/2018   Procedure: ESOPHAGOGASTRODUODENOSCOPY (EGD) WITH PROPOFOL;  Surgeon: Kerin Salen, MD;  Location: [facility];  Service: Gastroenterology;  Laterality: N/A;   HERNIA REPAIR  04-26-2010   IR IMAGING GUIDED PORT INSERTION  10/29/2021   MASS EXCISION Left 01/26/2013   Procedure: EXCISION LEFT CHEST WALL MASS AND LEFT ABDOMNAL WALL MASS;  Surgeon: [name], MD;  Location: [facility];  Service: General;  Laterality: Left;   MASS EXCISION Left 09/20/2014   Procedure: EXCISION OF LEFT CHEST WALL MASS;  Surgeon: [name], MD;  Location: [facility];  Service: General;  Laterality: Left;   TEE WITHOUT CARDIOVERSION N/A 12/08/2017   Procedure: TRANSESOPHAGEAL ECHOCARDIOGRAM (TEE);  Surgeon: Lewayne Bunting, MD;  Location: [facility];  Service: Cardiovascular;  Laterality: N/A;   TOTAL HIP ARTHROPLASTY Right 01/05/2017   TOTAL HIP ARTHROPLASTY Right 01/05/2017   Procedure: TOTAL HIP ARTHROPLASTY ANTERIOR APPROACH;  Surgeon: Gean Birchwood, MD;  Location: [facility];  Service: Orthopedics;  Laterality: Right;    SOCIAL HISTORY: Social History   Socioeconomic History   Marital status: Married    Spouse name: Not on file   Number of children: Not on file   Years of education: Not on file   Highest education level: Not on file  Occupational History   Occupation: retired Child psychotherapist  Tobacco Use   Smoking status: Former    Current packs/day: 0.00    Types: Cigarettes    Quit date: 04/14/1972    Years since quitting: 50.9   Smokeless tobacco: Never  Vaping Use   Vaping status: Never Used  Substance and Sexual Activity   Alcohol use: Yes    Comment: occasional   Drug use:  Not Currently   Sexual activity: Yes    Birth control/protection: Surgical  Other Topics Concern   Not on file  Social History Narrative   Not on file   Social Determinants of Health   Financial Resource Strain: Not on file  Food Insecurity: No Food Insecurity (01/06/2023)   Hunger Vital Sign    Worried About Running Out of Food in the Last Year:  Never true    Ran Out of Food in the Last Year: Never true  Transportation Needs: No Transportation Needs (01/06/2023)   PRAPARE - Administrator, Civil Service (Medical): No    Lack of Transportation (Non-Medical): No  Physical Activity: Not on file  Stress: Not on file  Social Connections: Not on file  Intimate Partner Violence: Not At Risk (01/06/2023)   Humiliation, Afraid, Rape, and Kick questionnaire    Fear of Current or Ex-Partner: No    Emotionally Abused: No    Physically Abused: No    Sexually Abused: No    FAMILY HISTORY: Family History  Problem Relation Age of Onset   Breast cancer Cousin        maternal first cousin   Uterine cancer Cousin        maternal first cousin    Review of Systems  Constitutional:  Positive for fatigue. Negative for appetite change, chills, fever and unexpected weight change.  HENT:   Negative for hearing loss, lump/mass and trouble swallowing.   Eyes:  Negative for eye problems and icterus.  Respiratory:  Negative for chest tightness, cough and shortness of breath.   Cardiovascular:  Negative for chest pain, leg swelling and palpitations.  Gastrointestinal:  Positive for constipation. Negative for abdominal distention, abdominal pain, diarrhea, nausea and vomiting.  Endocrine: Negative for hot flashes.  Genitourinary:  Negative for difficulty urinating.   Musculoskeletal:  Negative for arthralgias.  Skin:  Negative for itching and rash.  Neurological:  Negative for dizziness, extremity weakness, headaches and numbness.  Hematological:  Negative for adenopathy. Does not  bruise/bleed easily.  Psychiatric/Behavioral:  Negative for depression. The patient is not nervous/anxious.       PHYSICAL EXAMINATION   Vitals:   02/26/23 1132  BP: (!) 174/72  Pulse: 95  Resp: 16  Temp: (!) 97.2 F (36.2 C)  SpO2: 100%    Physical Exam Constitutional:      General: She is not in acute distress.    Appearance: Normal appearance. She is not toxic-appearing.  HENT:     Head: Normocephalic and atraumatic.     Mouth/Throat:     Mouth: Mucous membranes are moist.     Pharynx: Oropharynx is clear. No oropharyngeal exudate or posterior oropharyngeal erythema.  Eyes:     General: No scleral icterus. Cardiovascular:     Rate and Rhythm: Normal rate and regular rhythm.     Heart sounds: Normal heart sounds.  Pulmonary:     Effort: Pulmonary effort is normal.     Breath sounds: Normal breath sounds.  Musculoskeletal:        General: Swelling (LUE lymphedema) present.     Cervical back: Neck supple.  Lymphadenopathy:     Cervical: No cervical adenopathy.  Skin:    General: Skin is warm and dry.     Findings: No rash.  Neurological:     General: No focal deficit present.     Mental Status: She is alert.  Psychiatric:        Mood and Affect: Mood normal.        Behavior: Behavior normal.     LABORATORY DATA:  CBC    Component Value Date/Time   WBC 5.5 02/26/2023 1114   WBC 6.2 01/09/2023 1615   RBC 4.42 02/26/2023 1114   HGB 10.9 (L) 02/26/2023 1114   HGB 10.5 (L) 01/20/2017 1057   HCT 35.2 (L) 02/26/2023 1114   HCT 25.8 (L) 01/05/2018 8413  HCT 32.8 (L) 01/20/2017 1057   PLT 189 02/26/2023 1114   PLT 368 01/20/2017 1057   MCV 79.6 (L) 02/26/2023 1114   MCV 77.8 (L) 01/20/2017 1057   MCH 24.7 (L) 02/26/2023 1114   MCHC 31.0 02/26/2023 1114   RDW 16.9 (H) 02/26/2023 1114   RDW 16.0 (H) 01/20/2017 1057   LYMPHSABS 1.7 02/26/2023 1114   LYMPHSABS 1.3 01/20/2017 1057   MONOABS 0.4 02/26/2023 1114   MONOABS 0.4 01/20/2017 1057   EOSABS 0.1  02/26/2023 1114   EOSABS 0.1 01/20/2017 1057   BASOSABS 0.0 02/26/2023 1114   BASOSABS 0.1 01/20/2017 1057    CMP     Component Value Date/Time   NA 139 02/26/2023 1114   NA 139 01/20/2017 1057   K 3.9 02/26/2023 1114   K 3.9 01/20/2017 1057   CL 103 02/26/2023 1114   CO2 29 02/26/2023 1114   CO2 25 01/20/2017 1057   GLUCOSE 196 (H) 02/26/2023 1114   GLUCOSE 135 01/20/2017 1057   BUN 17 02/26/2023 1114   BUN 15.6 01/20/2017 1057   CREATININE 0.68 02/26/2023 1114   CREATININE 0.7 01/20/2017 1057   CALCIUM 9.6 02/26/2023 1114   CALCIUM 9.3 01/20/2017 1057   PROT 7.5 02/26/2023 1114   PROT 7.1 01/20/2017 1057   ALBUMIN 3.8 02/26/2023 1114   ALBUMIN 3.4 (L) 01/20/2017 1057   AST 28 02/26/2023 1114   AST 10 01/20/2017 1057   ALT 15 02/26/2023 1114   ALT 14 01/20/2017 1057   ALKPHOS 67 02/26/2023 1114   ALKPHOS 69 01/20/2017 1057   BILITOT 0.5 02/26/2023 1114   BILITOT 0.43 01/20/2017 1057   GFRNONAA >60 02/26/2023 1114   GFRAA >60 07/14/2019 0538   ASSESSMENT and THERAPY PLAN:  Tami Schmidt is a 89 y.o. female who presents today for a follow-up of her triple positive breast cancer recurrence in her left axilla, subpectoral, and supraclavicular lymph nodes.   #Recurrent triple positive breast cancer: --Currently on second line Kadycla, tolerating very well --Labs today show anemia with hemoglobin over 10 g/dL.   I have offered to give her a break if she is very tired of the treatment but she wanted to proceed hence we will proceed with the same treatment dosage --RTC in 3 weeks with labs and follow up before next dose of Kaycla. -Last imaging in September with stable disease -Echocardiogram from October noted mild change in EF, will continue to monitor -Will continue Kadcyla until December, repeat imaging if she continues to have stable disease, we will give her a treatment break.  Anemia Improved with hemoglobin at 10.9, up from 7.  No recent bloody or black stools.  Patient taking iron supplement every other day. -Continue iron supplement every other day.  Constipation Improved with daily use of stool softener. -Continue stool softener as needed.  Left Hand Swelling Stable, with some daily variation. No worsening. -Monitor and report any changes.  Taste and Smell Changes New symptom, not typically associated with Kadcyla. No recent COVID infection. -Continue to monitor and report any changes.  No problem-specific Assessment & Plan notes found for this encounter.    All questions were answered. The patient knows to call the clinic with any problems, questions or concerns. We can certainly see the patient much sooner if necessary.  I have spent a total of 30 minutes minutes of face-to-face and non-face-to-face time, preparing to see the patient, , performing a medically appropriate examination, counseling and educating the patient, ordering medications/tests/procedures, referring  and communicating with other health care professionals, documenting clinical information in the electronic health record, independently interpreting results and communicating results to the patient, and care coordination.   Rachel Moulds MD

## 2023-02-26 NOTE — Patient Instructions (Signed)
[facility] - A DEPT OF [facility]  Discharge Instructions: Thank you for choosing [facility] to provide your oncology and hematology care.   If you have a lab appointment with the Cancer Center, please go directly to the Cancer Center and check in at the registration area.   Wear comfortable clothing and clothing appropriate for easy access to any Portacath or PICC line.   We strive to give you quality time with your provider. You may need to reschedule your appointment if you arrive late (15 or more minutes).  Arriving late affects you and other patients whose appointments are after yours.  Also, if you miss three or more appointments without notifying the office, you may be dismissed from the clinic at the provider's discretion.      For prescription refill requests, have your pharmacy contact our office and allow 72 hours for refills to be completed.    Today you received the following chemotherapy and/or immunotherapy agents Kadcyla   To help prevent nausea and vomiting after your treatment, we encourage you to take your nausea medication as directed.  BELOW ARE SYMPTOMS THAT SHOULD BE REPORTED IMMEDIATELY: *FEVER GREATER THAN 100.4 F (38 C) OR HIGHER *CHILLS OR SWEATING *NAUSEA AND VOMITING THAT IS NOT CONTROLLED WITH YOUR NAUSEA MEDICATION *UNUSUAL SHORTNESS OF BREATH *UNUSUAL BRUISING OR BLEEDING *URINARY PROBLEMS (pain or burning when urinating, or frequent urination) *BOWEL PROBLEMS (unusual diarrhea, constipation, pain near the anus) TENDERNESS IN MOUTH AND THROAT WITH OR WITHOUT PRESENCE OF ULCERS (sore throat, sores in mouth, or a toothache) UNUSUAL RASH, SWELLING OR PAIN  UNUSUAL VAGINAL DISCHARGE OR ITCHING   Items with * indicate a potential emergency and should be followed up as soon as possible or go to the Emergency Department if any problems should occur.  Please show the CHEMOTHERAPY ALERT CARD or IMMUNOTHERAPY ALERT  CARD at check-in to the Emergency Department and triage nurse.  Should you have questions after your visit or need to cancel or reschedule your appointment, please contact [facility] - A DEPT OF [facility]  Dept: [phone]  and follow the prompts.  Office hours are 8:00 a.m. to 4:30 p.m. Monday - Friday. Please note that voicemails left after 4:00 p.m. may not be returned until the following business day.  We are closed weekends and major holidays. You have access to a nurse at all times for urgent questions. Please call the main number to the clinic Dept: [phone] and follow the prompts.   For any non-urgent questions, you may also contact your provider using MyChart. We now offer e-Visits for anyone 45 and older to request care online for non-urgent symptoms. For details visit [email or web address].   Also download the MyChart app! Go to the app store, search "MyChart", open the app, select [facility], and log in with your MyChart username and password.

## 2023-03-03 ENCOUNTER — Other Ambulatory Visit: Payer: Self-pay

## 2023-03-05 ENCOUNTER — Other Ambulatory Visit: Payer: Self-pay

## 2023-03-16 ENCOUNTER — Ambulatory Visit (INDEPENDENT_AMBULATORY_CARE_PROVIDER_SITE_OTHER): Payer: Medicare Other | Admitting: Podiatry

## 2023-03-16 ENCOUNTER — Encounter: Payer: Self-pay | Admitting: Podiatry

## 2023-03-16 DIAGNOSIS — L600 Ingrowing nail: Secondary | ICD-10-CM | POA: Diagnosis not present

## 2023-03-16 NOTE — Progress Notes (Signed)
  Subjective:  Patient ID: Tami Schmidt, female    DOB: March 22, 1933,  MRN: [id number]  Chief Complaint  Patient presents with   Diabetes    "My doctor referred me.  I guess it was about what she said, an ingrown toenail but I don't think I have a problem."    89 y.o. female presents with the above complaint. History confirmed with patient.   Objective:  Physical Exam: warm, good capillary refill, no trophic changes or ulcerative lesions, normal DP and PT pulses, normal sensory exam, and resolved right great toe paronychia no active ingrown or infection currently.  Assessment:   1. Ingrown toenail without infection      Plan:  Patient was evaluated and treated and all questions answered.  Ingrowing nail has resolved there was dry skin there in the debrided this and the nail plate and slight back fashion to alleviate pressure and prevent recurrence, she is not having pain currently and there is no impending risk of infection and she said it has not been hurting her at all.  I did not recommend avulsion of the nail plate or any portion of it at this point.  She will follow me as needed if it returns or worsens.  Return if symptoms worsen or fail to improve.

## 2023-03-20 ENCOUNTER — Inpatient Hospital Stay: Payer: Medicare Other | Attending: Adult Health | Admitting: Hematology and Oncology

## 2023-03-20 ENCOUNTER — Inpatient Hospital Stay: Payer: Medicare Other

## 2023-03-20 VITALS — BP 155/62 | HR 76 | Temp 97.8°F | Resp 16 | Wt 172.3 lb

## 2023-03-20 DIAGNOSIS — Z170 Estrogen receptor positive status [ER+]: Secondary | ICD-10-CM | POA: Insufficient documentation

## 2023-03-20 DIAGNOSIS — Z5112 Encounter for antineoplastic immunotherapy: Secondary | ICD-10-CM | POA: Insufficient documentation

## 2023-03-20 DIAGNOSIS — C773 Secondary and unspecified malignant neoplasm of axilla and upper limb lymph nodes: Secondary | ICD-10-CM | POA: Insufficient documentation

## 2023-03-20 DIAGNOSIS — Z95828 Presence of other vascular implants and grafts: Secondary | ICD-10-CM

## 2023-03-20 DIAGNOSIS — I10 Essential (primary) hypertension: Secondary | ICD-10-CM | POA: Diagnosis not present

## 2023-03-20 DIAGNOSIS — K5900 Constipation, unspecified: Secondary | ICD-10-CM | POA: Insufficient documentation

## 2023-03-20 DIAGNOSIS — C50312 Malignant neoplasm of lower-inner quadrant of left female breast: Secondary | ICD-10-CM | POA: Diagnosis not present

## 2023-03-20 DIAGNOSIS — C50919 Malignant neoplasm of unspecified site of unspecified female breast: Secondary | ICD-10-CM

## 2023-03-20 DIAGNOSIS — M8580 Other specified disorders of bone density and structure, unspecified site: Secondary | ICD-10-CM | POA: Insufficient documentation

## 2023-03-20 DIAGNOSIS — M170 Bilateral primary osteoarthritis of knee: Secondary | ICD-10-CM | POA: Diagnosis not present

## 2023-03-20 LAB — CMP (CANCER CENTER ONLY)
ALT: 16 U/L (ref 0–44)
AST: 27 U/L (ref 15–41)
Albumin: 3.7 g/dL (ref 3.5–5.0)
Alkaline Phosphatase: 84 U/L (ref 38–126)
Anion gap: 7 (ref 5–15)
BUN: 18 mg/dL (ref 8–23)
CO2: 28 mmol/L (ref 22–32)
Calcium: 9.4 mg/dL (ref 8.9–10.3)
Chloride: 103 mmol/L (ref 98–111)
Creatinine: 0.69 mg/dL (ref 0.44–1.00)
GFR, Estimated: >60 mL/min (ref >60)
Glucose, Bld: 191 mg/dL — ABNORMAL HIGH (ref 70–99)
Potassium: 3.8 mmol/L (ref 3.5–5.1)
Sodium: 138 mmol/L (ref 135–145)
Total Bilirubin: 0.5 mg/dL (ref <1.2)
Total Protein: 7.1 g/dL (ref 6.5–8.1)

## 2023-03-20 LAB — CBC WITH DIFFERENTIAL (CANCER CENTER ONLY)
Abs Immature Granulocytes: 0.02 10*3/uL (ref 0.00–0.07)
Basophils Absolute: 0 10*3/uL (ref 0.0–0.1)
Basophils Relative: 1 %
Eosinophils Absolute: 0.2 10*3/uL (ref 0.0–0.5)
Eosinophils Relative: 3 %
HCT: 35.9 % — ABNORMAL LOW (ref 36.0–46.0)
Hemoglobin: 11.1 g/dL — ABNORMAL LOW (ref 12.0–15.0)
Immature Granulocytes: 0 %
Lymphocytes Relative: 23 %
Lymphs Abs: 1.3 10*3/uL (ref 0.7–4.0)
MCH: 24.9 pg — ABNORMAL LOW (ref 26.0–34.0)
MCHC: 30.9 g/dL (ref 30.0–36.0)
MCV: 80.7 fL (ref 80.0–100.0)
Monocytes Absolute: 0.4 10*3/uL (ref 0.1–1.0)
Monocytes Relative: 6 %
Neutro Abs: 3.8 10*3/uL (ref 1.7–7.7)
Neutrophils Relative %: 67 %
Platelet Count: 179 10*3/uL (ref 150–400)
RBC: 4.45 MIL/uL (ref 3.87–5.11)
RDW: 16.6 % — ABNORMAL HIGH (ref 11.5–15.5)
WBC Count: 5.6 10*3/uL (ref 4.0–10.5)
nRBC: 0 % (ref 0.0–0.2)

## 2023-03-20 MED ORDER — SODIUM CHLORIDE 0.9% FLUSH
10.0000 mL | Freq: Once | INTRAVENOUS | Status: AC | PRN
  Administered 2023-03-20: 10 mL

## 2023-03-20 NOTE — Progress Notes (Signed)
[facility] Cancer Follow up:   DIAGNOSIS:  Cancer Staging  Chest wall recurrence of breast cancer [facility]) Staging form: Breast, AJCC 7th Edition - Clinical: Stage Unknown (TX, N1, M0) - Signed by Lowella Dell, MD on 07/27/2014  Malignant neoplasm of lower-inner quadrant of left breast in female, estrogen receptor positive (HCC) Staging form: Breast, AJCC 7th Edition - Clinical: Stage IA (T1b, N0, M0) - Signed by Lowella Dell, MD on 07/27/2014   SUMMARY OF ONCOLOGIC HISTORY: (1) status post right lumpectomy and axillary dissection in 1993 in [place] for what appears to have been a stage I breast cancer, treated with radiation and then tamoxifen for 5 years   (2) status post right mastectomy 2002 for a recurrence in the right breast, treated adjuvantly with tamoxifen for an additional 5 years   (3) status post left mastectomy and sentinel lymph node sampling 03/07/2003 for a lower inner quadrant pT1b, pN0, stage IA mucinous breast cancer, grade 2; no radiation therapy was given. Tamoxifen was continued until 2009   RECURRENT DISEASE/ LEFT: OCTOBER 2014 (4) excisional biopsy of a left chest wall mass 01/26/2013 showing carcinoma consistent with invasive ductal carcinoma, estrogen receptor 100% positive, progesterone receptor 0% positive, with an MIB-1 of 22% and no HER-2 amplification.    (5)  PET scan on 03/03/2013 confirmed extensive metastatic adenopathy in the left axilla, left subpectoral musculature, and left supraclavicular region   (6)  started on anastrozole daily beginning 03/01/2013, interrupted during radiation therapy, resumed March 2015, discontinued March 2016 because of fatigue and arthralgias   (7) radiation therapy 03/31/2013 through 05/23/2013 Site/dose:   The patient was treated initially with a forward treatment planning technique to the left chest wall in addition to treatment to the supraclavicular region. This consisted of a 3-D conformal  technique. The patient was treated in this fashion to a dose of 50.4 gray. The patient then received a 14 gray boost treatment using an electron field. The total dose was 64.4 gray.   (8) Osteopenia, zometa yearly started 07/27/2014, but poorly tolerated.              (a) bone density scan on 12/27/13 showed a t-score of -1.8              (b) started Denosumab/Prolia April 2017, canceled after one dose per patient   (9) tamoxifen started 07/27/2014, stopped November 2019 (had total 5 years of antiestrogens)   (10) likely thalassemia trait   (11) left subpectoral soft tissue nodule noted 07/17/2015 unchanged through serial scans, most recent 02/09/2018, with no metabolic activity noted in PET scan of 2016 and interval calcification.  This is presumed benign   (12)  ADDITIONAL REGIONAL RECURRENCE:             (A) CT of the chest with contrast 03/05/2021 obtained to evaluate left upper extremity lymphedema as showed acute pulmonary emboli.  There was also left axillary and subpectoral lymphadenopathy             (B) rivaroxaban started 03/05/2021             (C) left breast ultrasound 03/29/2021 shows 2 areas in the infraclavicular tissue consistent with fat necrosis.  There were however multiple retropectoral interpectoral and left axillary masses.             (D) left axillary lymph node biopsy 04/04/2021: IDC grade 3, ER positive, 100%, strong staining intensity, PR positive, 20%, weak staining intensity, Ki-67 of 15%, HER2  positive by IHC, ratio 2.90             (E) Fulvestrant every 4 weeks beginning 04/18/2021             (F) Herceptin every 3 weeks beginning 05/30/2021             Mild progression noted on 12/12 CT chest/abd/pelvis             (G) Kadcyla every 3 weeks beginning 05/01/2022  CURRENT THERAPY: Kadcyla  INTERVAL HISTORY: Tami Schmidt 89 y.o. female returns for follow-up on treatment with Kadcyla.   Discussed the use of AI scribe software for clinical note transcription with  the patient, who gave verbal consent to proceed.  History of Present Illness    The patient, with a history of breast cancer, presents with swelling and discomfort from ongoing treatment. She expresses dissatisfaction with the current treatment, stating 'it's not working' and left arm swollen up again.' She has previously been offered radiation as an alternative, but initial discussions with the radiation team did not provide a clear picture of its potential benefits. However, after further discussion with the doctor, she is open to pursuing radiation if it can target the two areas of concern and potentially allow her to stop the current medication.  The patient also reports constipation, which has been somewhat alleviated by a stool softener. She expresses interest in a more prescriptive laxative. She has stopped taking metformin due to improved blood sugar levels, and currently takes amlodipine, HCZ, turmeric, and vitamin D. She has also experienced a fall due to weakness in the knees and shoulders, which she attributes to arthritis.  Rest of the pertinent 10 point ROS reviewed and neg.   Patient Active Problem List   Diagnosis Date Noted   Acute lower GI bleeding 01/07/2023   Rectal bleeding 01/05/2023   Acute GI bleeding 10/04/2022   Port-A-Cath in place 06/12/2022   Loss of perception for taste 03/17/2022   Primary osteoarthritis involving multiple joints 03/17/2022   Genetic testing 07/24/2021   Microcytosis 04/29/2021   Hypokalemia 04/28/2021   Pulmonary embolism (HCC)    Agnosia 08/09/2020   Hardening of the aorta (main artery of the heart) (HCC) 08/09/2020   Morbid obesity (HCC) 08/09/2020   Neuropathy 08/09/2020   Primary osteoarthritis 08/09/2020   Sensorineural hearing loss (SNHL) of both ears 08/07/2020   Anosmia 06/05/2020   Tinnitus, bilateral 06/05/2020   GI bleed 07/13/2019   ABLA (acute blood loss anemia) 07/12/2019   Microcytic anemia 01/05/2018   Melena  01/04/2018   Lower GI bleed 01/04/2018   Diabetes mellitus type II, controlled, with no complications (HCC) 12/05/2017   Tenosynovitis of left wrist 12/02/2017   Infection of left wrist (HCC) 11/30/2017   Primary osteoarthritis of right hip 01/05/2017   Osteoarthritis of right hip 01/03/2017   Lymphedema of upper extremity 01/17/2014   Osteopenia 01/17/2014   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   Dilated pore of [place] 08/02/2013   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   Diabetes mellitus    fasting 90-100   HTN (hypertension) 03/25/2013   Hx of radiation therapy    breasts hx   Hypercholesterolemia    Hypertension    Lymphedema of arm    left arm   Pulmonary embolism (HCC)    Type II or unspecified type diabetes mellitus without mention of complication, not stated as uncontrolled 03/25/2013    SURGICAL HISTORY: Past Surgical History:  Procedure Laterality Date   ABDOMINAL HYSTERECTOMY     ANKLE SURGERY Right 1995   APPENDECTOMY     BIOPSY  01/07/2018   Procedure: BIOPSY;  Surgeon: Kerin Salen, MD;  Location: [facility];  Service: Gastroenterology;;   BREAST SURGERY Bilateral    mastectomy   COLONOSCOPY WITH PROPOFOL N/A 01/07/2018   Procedure: COLONOSCOPY WITH PROPOFOL;   Surgeon: Kerin Salen, MD;  Location: [facility];  Service: Gastroenterology;  Laterality: N/A;   ESOPHAGOGASTRODUODENOSCOPY (EGD) WITH PROPOFOL N/A 01/06/2018   Procedure: ESOPHAGOGASTRODUODENOSCOPY (EGD) WITH PROPOFOL;  Surgeon: Kerin Salen, MD;  Location: [facility];  Service: Gastroenterology;  Laterality: N/A;   HERNIA REPAIR  04-26-2010   IR IMAGING GUIDED PORT INSERTION  10/29/2021   MASS EXCISION Left 01/26/2013   Procedure: EXCISION LEFT CHEST WALL MASS AND LEFT ABDOMNAL WALL MASS;  Surgeon: [name], MD;  Location: [facility];  Service: General;  Laterality: Left;   MASS EXCISION Left 09/20/2014   Procedure: EXCISION OF LEFT CHEST WALL MASS;  Surgeon: [name], MD;  Location: [facility];  Service: General;  Laterality: Left;   TEE WITHOUT CARDIOVERSION N/A 12/08/2017   Procedure: TRANSESOPHAGEAL ECHOCARDIOGRAM (TEE);  Surgeon: Lewayne Bunting, MD;  Location: [facility];  Service: Cardiovascular;  Laterality: N/A;   TOTAL HIP ARTHROPLASTY Right 01/05/2017   TOTAL HIP ARTHROPLASTY Right 01/05/2017   Procedure: TOTAL HIP ARTHROPLASTY ANTERIOR APPROACH;  Surgeon: Gean Birchwood, MD;  Location: [facility] OR;  Service: Orthopedics;  Laterality: Right;    SOCIAL HISTORY: Social History   Socioeconomic History   Marital status: Married    Spouse name: Not on file   Number of children: Not on file   Years of education: Not on file   Highest education level: Not on file  Occupational History   Occupation: retired Child psychotherapist  Tobacco Use   Smoking status: Former    Current packs/day: 0.00    Types: Cigarettes    Quit date: 04/14/1972    Years since quitting: 50.9   Smokeless tobacco: Never  Vaping Use   Vaping status: Never Used  Substance and Sexual Activity   Alcohol use: Not Currently    Comment: occasional   Drug use: Not Currently   Sexual activity: Yes    Birth control/protection: Surgical  Other Topics Concern   Not on file  Social History  Narrative   Not on file   Social Determinants of Health   Financial Resource Strain: Not on file  Food Insecurity: No Food Insecurity (01/06/2023)   Hunger Vital Sign    Worried About Running Out of Food in the Last Year: Never true    Ran Out of Food in the Last Year: Never true  Transportation Needs: No Transportation Needs (01/06/2023)   PRAPARE - Administrator, Civil Service (Medical): No  Lack of Transportation (Non-Medical): No  Physical Activity: Not on file  Stress: Not on file  Social Connections: Not on file  Intimate Partner Violence: Not At Risk (01/06/2023)   Humiliation, Afraid, Rape, and Kick questionnaire    Fear of Current or Ex-Partner: No    Emotionally Abused: No    Physically Abused: No    Sexually Abused: No    FAMILY HISTORY: Family History  Problem Relation Age of Onset   Breast cancer Cousin        maternal first cousin   Uterine cancer Cousin        maternal first cousin    Review of Systems  Constitutional:  Positive for fatigue. Negative for appetite change, chills, fever and unexpected weight change.  HENT:   Negative for hearing loss, lump/mass and trouble swallowing.   Eyes:  Negative for eye problems and icterus.  Respiratory:  Negative for chest tightness, cough and shortness of breath.   Cardiovascular:  Negative for chest pain, leg swelling and palpitations.  Gastrointestinal:  Positive for constipation. Negative for abdominal distention, abdominal pain, diarrhea, nausea and vomiting.  Endocrine: Negative for hot flashes.  Genitourinary:  Negative for difficulty urinating.   Musculoskeletal:  Negative for arthralgias.  Skin:  Negative for itching and rash.  Neurological:  Negative for dizziness, extremity weakness, headaches and numbness.  Hematological:  Negative for adenopathy. Does not bruise/bleed easily.  Psychiatric/Behavioral:  Negative for depression. The patient is not nervous/anxious.   LUE swelling,  lymphedema.   PHYSICAL EXAMINATION   Vitals:   03/20/23 1247  BP: (!) 155/62  Pulse: 76  Resp: 16  Temp: 97.8 F (36.6 C)  SpO2: 99%    Physical Exam Constitutional:      General: She is not in acute distress.    Appearance: Normal appearance. She is not toxic-appearing.  HENT:     Head: Normocephalic and atraumatic.     Mouth/Throat:     Mouth: Mucous membranes are moist.     Pharynx: Oropharynx is clear. No oropharyngeal exudate or posterior oropharyngeal erythema.  Eyes:     General: No scleral icterus. Cardiovascular:     Rate and Rhythm: Normal rate and regular rhythm.     Heart sounds: Normal heart sounds.  Pulmonary:     Effort: Pulmonary effort is normal.     Breath sounds: Normal breath sounds.  Musculoskeletal:        General: Swelling (LUE lymphedema) present.     Cervical back: Neck supple.  Lymphadenopathy:     Cervical: No cervical adenopathy.  Skin:    General: Skin is warm and dry.     Findings: No rash.  Neurological:     General: No focal deficit present.     Mental Status: She is alert.  Psychiatric:        Mood and Affect: Mood normal.        Behavior: Behavior normal.     LABORATORY DATA:  CBC    Component Value Date/Time   WBC 5.6 03/20/2023 1218   WBC 6.2 01/09/2023 1615   RBC 4.45 03/20/2023 1218   HGB 11.1 (L) 03/20/2023 1218   HGB 10.5 (L) 01/20/2017 1057   HCT 35.9 (L) 03/20/2023 1218   HCT 25.8 (L) 01/05/2018 0513   HCT 32.8 (L) 01/20/2017 1057   PLT 179 03/20/2023 1218   PLT 368 01/20/2017 1057   MCV 80.7 03/20/2023 1218   MCV 77.8 (L) 01/20/2017 1057   MCH 24.9 (L) 03/20/2023 1218  MCHC 30.9 03/20/2023 1218   RDW 16.6 (H) 03/20/2023 1218   RDW 16.0 (H) 01/20/2017 1057   LYMPHSABS 1.3 03/20/2023 1218   LYMPHSABS 1.3 01/20/2017 1057   MONOABS 0.4 03/20/2023 1218   MONOABS 0.4 01/20/2017 1057   EOSABS 0.2 03/20/2023 1218   EOSABS 0.1 01/20/2017 1057   BASOSABS 0.0 03/20/2023 1218   BASOSABS 0.1 01/20/2017 1057     CMP     Component Value Date/Time   NA 138 03/20/2023 1218   NA 139 01/20/2017 1057   K 3.8 03/20/2023 1218   K 3.9 01/20/2017 1057   CL 103 03/20/2023 1218   CO2 28 03/20/2023 1218   CO2 25 01/20/2017 1057   GLUCOSE 191 (H) 03/20/2023 1218   GLUCOSE 135 01/20/2017 1057   BUN 18 03/20/2023 1218   BUN 15.6 01/20/2017 1057   CREATININE 0.69 03/20/2023 1218   CREATININE 0.7 01/20/2017 1057   CALCIUM 9.4 03/20/2023 1218   CALCIUM 9.3 01/20/2017 1057   PROT 7.1 03/20/2023 1218   PROT 7.1 01/20/2017 1057   ALBUMIN 3.7 03/20/2023 1218   ALBUMIN 3.4 (L) 01/20/2017 1057   AST 27 03/20/2023 1218   AST 10 01/20/2017 1057   ALT 16 03/20/2023 1218   ALT 14 01/20/2017 1057   ALKPHOS 84 03/20/2023 1218   ALKPHOS 69 01/20/2017 1057   BILITOT 0.5 03/20/2023 1218   BILITOT 0.43 01/20/2017 1057   GFRNONAA >60 03/20/2023 1218   GFRAA >60 07/14/2019 0538   ASSESSMENT and THERAPY PLAN:  NAKIMA PROBY is a 89 y.o. female who presents today for a follow-up of her triple positive breast cancer recurrence in her left axilla, subpectoral, and supraclavicular lymph nodes.   #Recurrent triple positive breast cancer: --Currently on second line Kadycla, tolerating very well Patient experiencing swelling and discomfort from current treatment.  Discussed alternative option of radiation therapy targeting affected areas, -Contact radiation oncologist Dr. Mitzi Hansen to discuss potential for targeted radiation therapy. -Hold current treatment regimen. -Order CT scan in two weeks to assess current state of disease.  Constipation Patient reports constipation despite taking stool softener. -Recommend over-the-counter MiraLAX for additional relief.  Arthritis Patient reports difficulty with mobility due to arthritis in knees and shoulders. -No change in management discussed.  Hypertension and Diabetes Patient reports discontinuing Metformin due to controlled blood sugar levels and continuing  Amlodipine for blood pressure control. -No change in management discussed.  General Health Maintenance -Schedule follow-up after consultation with radiation oncologist and results of CT scan.    All questions were answered. The patient knows to call the clinic with any problems, questions or concerns. We can certainly see the patient much sooner if necessary.  I have spent a total of 30 minutes minutes of face-to-face and non-face-to-face time, preparing to see the patient, , performing a medically appropriate examination, counseling and educating the patient, ordering medications/tests/procedures, referring and communicating with other health care professionals, documenting clinical information in the electronic health record, independently interpreting results and communicating results to the patient, and care coordination.   Rachel Moulds MD

## 2023-03-21 ENCOUNTER — Telehealth: Payer: Self-pay | Admitting: Hematology and Oncology

## 2023-03-21 NOTE — Telephone Encounter (Signed)
 Left patient a vm regarding upcoming appointment

## 2023-03-26 NOTE — Progress Notes (Incomplete)
Location of Breast Cancer:  Malignant neoplasm of lower-inner quadrant of left breast in female, estrogen receptor positive   Receptor Status: ER(negative), PR (negative), Her2-neu (negative), Ki-67()   Past/Anticipated interventions by medical oncology, if any: Chemotherapy ***  Lymphedema issues, if any:  {:18581} {t:21944}   Pain issues, if any:  {:18581} {PAIN DESCRIPTION:21022940}  SAFETY ISSUES: Prior radiation? {:18581} Pacemaker/ICD? No Possible current pregnancy? Hysterectomy Is the patient on methotrexate? No  Current Complaints / other details:

## 2023-03-27 ENCOUNTER — Ambulatory Visit: Payer: Medicare Other

## 2023-03-27 ENCOUNTER — Ambulatory Visit: Payer: Medicare Other | Admitting: Radiology

## 2023-04-03 ENCOUNTER — Telehealth: Payer: Self-pay | Admitting: Radiation Oncology

## 2023-04-03 NOTE — Telephone Encounter (Signed)
Called patient to r/s FUN visit w. Alvino Chapel. No answer, LVM for a return call.

## 2023-04-06 ENCOUNTER — Telehealth: Payer: Self-pay | Admitting: Radiation Oncology

## 2023-04-06 NOTE — Telephone Encounter (Signed)
Called patient to r/s FUN visit due to change in providers schedule. Patient requested appointment delay.

## 2023-04-09 ENCOUNTER — Inpatient Hospital Stay: Payer: Medicare Other

## 2023-04-09 ENCOUNTER — Other Ambulatory Visit: Payer: Self-pay

## 2023-04-09 ENCOUNTER — Encounter: Payer: Self-pay | Admitting: Hematology and Oncology

## 2023-04-09 ENCOUNTER — Telehealth: Payer: Self-pay | Admitting: Radiation Oncology

## 2023-04-09 ENCOUNTER — Inpatient Hospital Stay: Payer: Medicare Other | Admitting: Hematology and Oncology

## 2023-04-09 VITALS — BP 162/65 | HR 81 | Temp 98.1°F | Resp 17 | Ht 67.0 in | Wt 168.0 lb

## 2023-04-09 VITALS — BP 153/60 | HR 68 | Temp 97.7°F | Resp 17

## 2023-04-09 DIAGNOSIS — Z170 Estrogen receptor positive status [ER+]: Secondary | ICD-10-CM

## 2023-04-09 DIAGNOSIS — Z5112 Encounter for antineoplastic immunotherapy: Secondary | ICD-10-CM | POA: Diagnosis not present

## 2023-04-09 DIAGNOSIS — C50312 Malignant neoplasm of lower-inner quadrant of left female breast: Secondary | ICD-10-CM

## 2023-04-09 DIAGNOSIS — C50919 Malignant neoplasm of unspecified site of unspecified female breast: Secondary | ICD-10-CM

## 2023-04-09 DIAGNOSIS — Z95828 Presence of other vascular implants and grafts: Secondary | ICD-10-CM

## 2023-04-09 LAB — CMP (CANCER CENTER ONLY)
ALT: 14 U/L (ref 0–44)
AST: 25 U/L (ref 15–41)
Albumin: 3.9 g/dL (ref 3.5–5.0)
Alkaline Phosphatase: 76 U/L (ref 38–126)
Anion gap: 6 (ref 5–15)
BUN: 19 mg/dL (ref 8–23)
CO2: 28 mmol/L (ref 22–32)
Calcium: 9.6 mg/dL (ref 8.9–10.3)
Chloride: 105 mmol/L (ref 98–111)
Creatinine: 0.61 mg/dL (ref 0.44–1.00)
GFR, Estimated: >60 mL/min (ref >60)
Glucose, Bld: 105 mg/dL — ABNORMAL HIGH (ref 70–99)
Potassium: 3.7 mmol/L (ref 3.5–5.1)
Sodium: 139 mmol/L (ref 135–145)
Total Bilirubin: 0.5 mg/dL (ref <1.2)
Total Protein: 7.5 g/dL (ref 6.5–8.1)

## 2023-04-09 LAB — CBC WITH DIFFERENTIAL (CANCER CENTER ONLY)
Abs Immature Granulocytes: 0.01 10*3/uL (ref 0.00–0.07)
Basophils Absolute: 0.1 10*3/uL (ref 0.0–0.1)
Basophils Relative: 1 %
Eosinophils Absolute: 0.3 10*3/uL (ref 0.0–0.5)
Eosinophils Relative: 6 %
HCT: 38.2 % (ref 36.0–46.0)
Hemoglobin: 12 g/dL (ref 12.0–15.0)
Immature Granulocytes: 0 %
Lymphocytes Relative: 35 %
Lymphs Abs: 1.5 10*3/uL (ref 0.7–4.0)
MCH: 24.7 pg — ABNORMAL LOW (ref 26.0–34.0)
MCHC: 31.4 g/dL (ref 30.0–36.0)
MCV: 78.8 fL — ABNORMAL LOW (ref 80.0–100.0)
Monocytes Absolute: 0.3 10*3/uL (ref 0.1–1.0)
Monocytes Relative: 7 %
Neutro Abs: 2.2 10*3/uL (ref 1.7–7.7)
Neutrophils Relative %: 51 %
Platelet Count: 180 10*3/uL (ref 150–400)
RBC: 4.85 MIL/uL (ref 3.87–5.11)
RDW: 16.6 % — ABNORMAL HIGH (ref 11.5–15.5)
WBC Count: 4.3 10*3/uL (ref 4.0–10.5)
nRBC: 0 % (ref 0.0–0.2)

## 2023-04-09 MED ORDER — SODIUM CHLORIDE 0.9% FLUSH
10.0000 mL | Freq: Once | INTRAVENOUS | Status: AC | PRN
  Administered 2023-04-09: 10 mL

## 2023-04-09 MED ORDER — SODIUM CHLORIDE 0.9 % IV SOLN: Freq: Once | INTRAVENOUS | Status: AC

## 2023-04-09 MED ORDER — DIPHENHYDRAMINE HCL 25 MG PO CAPS
50.0000 mg | ORAL_CAPSULE | Freq: Once | ORAL | Status: AC
  Administered 2023-04-09: 50 mg via ORAL
  Filled 2023-04-09: qty 2

## 2023-04-09 MED ORDER — SODIUM CHLORIDE 0.9% FLUSH
10.0000 mL | INTRAVENOUS | Status: DC | PRN
  Administered 2023-04-09: 10 mL

## 2023-04-09 MED ORDER — HEPARIN SOD (PORK) LOCK FLUSH 100 UNIT/ML IV SOLN
500.0000 [IU] | Freq: Once | INTRAVENOUS | Status: AC | PRN
  Administered 2023-04-09: 500 [IU]

## 2023-04-09 MED ORDER — ACETAMINOPHEN 325 MG PO TABS
650.0000 mg | ORAL_TABLET | Freq: Once | ORAL | Status: AC
  Administered 2023-04-09: 650 mg via ORAL
  Filled 2023-04-09: qty 2

## 2023-04-09 MED ORDER — SODIUM CHLORIDE 0.9 % IV SOLN
3.6000 mg/kg | Freq: Once | INTRAVENOUS | Status: AC
  Administered 2023-04-09: 260 mg via INTRAVENOUS
  Filled 2023-04-09: qty 8

## 2023-04-09 MED ORDER — PROCHLORPERAZINE MALEATE 10 MG PO TABS
10.0000 mg | ORAL_TABLET | Freq: Once | ORAL | Status: AC
  Administered 2023-04-09: 10 mg via ORAL
  Filled 2023-04-09: qty 1

## 2023-04-09 NOTE — Progress Notes (Signed)
Per Dr Pamelia Hoit, ok to use today's wt = 76 kg for Kadcyla dose.  Tx plan adjusted accordingly.  Ebony Hail, Pharm.D., CPP 04/09/2023@9 :26 AM

## 2023-04-09 NOTE — Progress Notes (Signed)
Pt declined to stay for post 30 obs. VSS at discharge

## 2023-04-09 NOTE — Progress Notes (Signed)
Patient Care Team: Pcp, No as PCP - General Otho Darner, MD as Consulting Physician (Family Medicine) Donnelly Angelica, RN as Oncology Nurse Navigator Pershing Proud, RN as Oncology Nurse Navigator Rachel Moulds, MD as Consulting Physician (Hematology and Oncology)  DIAGNOSIS:  Encounter Diagnosis  Name Primary?   Malignant neoplasm of lower-inner quadrant of left breast in female, estrogen receptor positive (HCC) Yes    SUMMARY OF ONCOLOGIC HISTORY: Oncology History  Malignant neoplasm of lower-inner quadrant of left breast in female, estrogen receptor positive (HCC)  06/16/2013 Initial Diagnosis   Malignant neoplasm of lower-inner quadrant of left breast in female, estrogen receptor positive (HCC)   05/30/2021 - 04/03/2022 Chemotherapy   Patient is on Treatment Plan : BREAST Trastuzumab q28d      Genetic Testing   [facility] CancerNext-Expanded Panel was Negative. 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 Report date is 07/22/2021.  The CancerNext-Expanded gene panel offered by [facility] and includes sequencing, rearrangement, and RNA analysis for the following 77 genes: AIP, ALK, APC, ATM, AXIN2, BAP1, BARD1, BLM, BMPR1A, BRCA1, BRCA2, BRIP1, CDC73, CDH1, CDK4, CDKN1B, CDKN2A, CHEK2, CTNNA1, DICER1, FANCC, FH, FLCN, GALNT12, KIF1B, LZTR1, MAX, MEN1, MET, MLH1, MSH2, MSH3, MSH6, MUTYH, NBN, NF1, NF2, NTHL1, PALB2, PHOX2B, PMS2, POT1, PRKAR1A, PTCH1, PTEN, RAD51C, RAD51D, RB1, RECQL, RET, SDHA, SDHAF2, SDHB, SDHC, SDHD, SMAD4, SMARCA4, SMARCB1, SMARCE1, STK11, SUFU, TMEM127, TP53, TSC1, TSC2, VHL and XRCC2 (sequencing and deletion/duplication); EGFR, EGLN1, HOXB13, KIT, MITF, PDGFRA, POLD1, and POLE (sequencing only); EPCAM and GREM1 (deletion/duplication only).    05/01/2022 -  Chemotherapy   Patient is on Treatment Plan : BREAST ADO-Trastuzumab Emtansine (Kadcyla) q21d       CHIEF COMPLIANT: Follow-up on Kadcyla  HISTORY OF  PRESENT ILLNESS: Discussed the use of AI scribe software for clinical note transcription with the patient, who gave verbal consent to proceed.  History of Present Illness   The patient, with a history of cancer, has been on a Kadcyla for almost a year, starting in January. The patient reports no side effects from the medication. The patient's recent scans in September showed stable lymph nodes. The patient has not experienced any nausea. The patient's labs show normal hemoglobin, white count, and platelets.      ALLERGIES:  is allergic to bactrim, lisinopril, vasotec, and sulfamethoxazole-trimethoprim.  MEDICATIONS:  Current Outpatient Medications  Medication Sig Dispense Refill   acetaminophen (TYLENOL) 500 MG tablet Take 1,000 mg by mouth as needed for moderate pain.     amLODipine (NORVASC) 10 MG tablet Take 1 tablet (10 mg total) by mouth daily. 90 tablet 0   amoxicillin (AMOXIL) 500 MG capsule Take 2,000 mg by mouth as needed. Take 1 hour prior to Dentist Appt. (Patient not taking: Reported on 03/16/2023)     lidocaine (LIDODERM) 5 % Place 1 patch onto the skin daily. Remove & Discard patch within 12 hours or as directed by MD 7 patch 0   metFORMIN (GLUCOPHAGE) 500 MG tablet Take 500 mg by mouth daily with breakfast.     olmesartan-hydrochlorothiazide (BENICAR HCT) 40-12.5 MG tablet Take 1 tablet by mouth daily.     Turmeric (QC TUMERIC COMPLEX PO) Take 1 tablet by mouth daily.     VITAMIN D PO Take 1 tablet by mouth daily.     No current facility-administered medications for this visit.    PHYSICAL EXAMINATION: ECOG PERFORMANCE STATUS: 1 - Symptomatic but completely ambulatory  Vitals:   04/09/23 9147  BP: (!) 162/65  Pulse: 81  Resp: 17  Temp: 98.1 F (36.7 C)  SpO2: 100%   Filed Weights   04/09/23 0839  Weight: 168 lb (76.2 kg)      LABORATORY DATA:  I have reviewed the data as listed    Latest Ref Rng & Units 03/20/2023   12:18 PM 02/26/2023   11:14 AM  02/05/2023   11:35 AM  CMP  Glucose 70 - 99 mg/dL 846  962  952   BUN 8 - 23 mg/dL 18  17  17    Creatinine 0.44 - 1.00 mg/dL 8.41  3.24  4.01   Sodium 135 - 145 mmol/L 138  139  140   Potassium 3.5 - 5.1 mmol/L 3.8  3.9  3.8   Chloride 98 - 111 mmol/L 103  103  104   CO2 22 - 32 mmol/L 28  29  25    Calcium 8.9 - 10.3 mg/dL 9.4  9.6  9.5   Total Protein 6.5 - 8.1 g/dL 7.1  7.5  7.2   Total Bilirubin <1.2 mg/dL 0.5  0.5  0.7   Alkaline Phos 38 - 126 U/L 84  67  60   AST 15 - 41 U/L 27  28  32   ALT 0 - 44 U/L 16  15  20      Lab Results  Component Value Date   WBC 4.3 04/09/2023   HGB 12.0 04/09/2023   HCT 38.2 04/09/2023   MCV 78.8 (L) 04/09/2023   PLT 180 04/09/2023   NEUTROABS 2.2 04/09/2023    ASSESSMENT & PLAN:  Malignant neoplasm of lower-inner quadrant of left breast in female, estrogen receptor positive (HCC) 1993: Right lumpectomy axillary dissection in [place] (stage I) treated with radiation and tamoxifen x 5 years 2002: Right mastectomy: Right breast recurrence 2004: Left mastectomy: T1b N0 stage Ia mucinous breast cancer grade 2 tamoxifen until 2009 2014: Recurrence left chest wall, excision, radiation, anastrozole 2017: Left subpectoral lymph node (presumably benign) 2022: Left lung PE, left axillary and subpectoral LN biopsy: Grade 3 IDC ER 100% PR 20% KI 15% HER2 positive 2023: Fulvestrant with Herceptin 05/01/2022: Kadcyla  CT CAP 01/09/2023: No PE stable left axillary/subpectoral lymphadenopathy Continue with current treatment and follow-up in 3 weeks for next cycle   I discussed with her that Dr. Al Pimple may decide if she could benefit from a PET scan down the line to assess if the stable lymph nodes have any residual metastatic disease.  She also wanted to know if she can do the treatment less often.  I instructed her to discuss this with Dr. Al Pimple      No orders of the defined types were placed in this encounter.  The patient has a good understanding of  the overall plan. she agrees with it. she will call with any problems that may develop before the next visit here. Total time spent: 30 mins including face to face time and time spent for planning, charting and co-ordination of care   Tamsen Meek, MD 04/09/23

## 2023-04-09 NOTE — Telephone Encounter (Signed)
I called and left a voicemail for the patient today noticing that she had an appointment for a discussion with me next Thursday.  This patient was seen in late spring of this year and was not felt to be a candidate for reirradiation with definitive intent out of concerns for risks to her brachial plexus.  She rather began systemic treatment and it appears that the patient is trying to consider alternative options to continuing systemic therapy.  Unfortunately she is not a candidate for curative therapy, while she could consider palliative radiation she would need to consider the options especially concerning risks of reirradiation.  I will follow-up with our scheduling staff as Dr. Mitzi Hansen will be out of the office next week and since I will not be back in the clinic until the day the patient has a schedule appointment.  I will copy her medical oncology team as well as our scheduling team if she were to call back.

## 2023-04-09 NOTE — Patient Instructions (Signed)
 [facility] - A DEPT OF [facility]  Discharge Instructions: Thank you for choosing [facility] to provide your oncology and hematology care.   If you have a lab appointment with the Cancer Center, please go directly to the Cancer Center and check in at the registration area.   Wear comfortable clothing and clothing appropriate for easy access to any Portacath or PICC line.   We strive to give you quality time with your provider. You may need to reschedule your appointment if you arrive late (15 or more minutes).  Arriving late affects you and other patients whose appointments are after yours.  Also, if you miss three or more appointments without notifying the office, you may be dismissed from the clinic at the provider's discretion.      For prescription refill requests, have your pharmacy contact our office and allow 72 hours for refills to be completed.    Today you received the following chemotherapy and/or immunotherapy agents: Kadcyla      To help prevent nausea and vomiting after your treatment, we encourage you to take your nausea medication as directed.  BELOW ARE SYMPTOMS THAT SHOULD BE REPORTED IMMEDIATELY: *FEVER GREATER THAN 100.4 F (38 C) OR HIGHER *CHILLS OR SWEATING *NAUSEA AND VOMITING THAT IS NOT CONTROLLED WITH YOUR NAUSEA MEDICATION *UNUSUAL SHORTNESS OF BREATH *UNUSUAL BRUISING OR BLEEDING *URINARY PROBLEMS (pain or burning when urinating, or frequent urination) *BOWEL PROBLEMS (unusual diarrhea, constipation, pain near the anus) TENDERNESS IN MOUTH AND THROAT WITH OR WITHOUT PRESENCE OF ULCERS (sore throat, sores in mouth, or a toothache) UNUSUAL RASH, SWELLING OR PAIN  UNUSUAL VAGINAL DISCHARGE OR ITCHING   Items with * indicate a potential emergency and should be followed up as soon as possible or go to the Emergency Department if any problems should occur.  Please show the CHEMOTHERAPY ALERT CARD or IMMUNOTHERAPY  ALERT CARD at check-in to the Emergency Department and triage nurse.  Should you have questions after your visit or need to cancel or reschedule your appointment, please contact [facility] - A DEPT OF [facility]  Dept: [phone]  and follow the prompts.  Office hours are 8:00 a.m. to 4:30 p.m. Monday - Friday. Please note that voicemails left after 4:00 p.m. may not be returned until the following business day.  We are closed weekends and major holidays. You have access to a nurse at all times for urgent questions. Please call the main number to the clinic Dept: [phone] and follow the prompts.   For any non-urgent questions, you may also contact your provider using MyChart. We now offer e-Visits for anyone 29 and older to request care online for non-urgent symptoms. For details visit [email or web address].   Also download the MyChart app! Go to the app store, search "MyChart", open the app, select , and log in with your MyChart username and password.

## 2023-04-09 NOTE — Assessment & Plan Note (Signed)
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

## 2023-04-10 ENCOUNTER — Ambulatory Visit: Payer: Medicare Other | Admitting: Radiology

## 2023-04-10 ENCOUNTER — Ambulatory Visit: Payer: Medicare Other

## 2023-04-13 ENCOUNTER — Other Ambulatory Visit: Payer: Self-pay | Admitting: Hematology and Oncology

## 2023-04-15 ENCOUNTER — Other Ambulatory Visit: Payer: Self-pay

## 2023-04-16 ENCOUNTER — Ambulatory Visit: Payer: Medicare Other

## 2023-04-16 ENCOUNTER — Ambulatory Visit
Admission: RE | Admit: 2023-04-16 | Discharge: 2023-04-16 | Disposition: A | Discharge status: Home / Self Care | Payer: Medicare Other | Source: Ambulatory Visit | Attending: Radiation Oncology | Admitting: Radiation Oncology

## 2023-04-16 NOTE — Progress Notes (Signed)
 I did attempt to call the patient again reviewed the discussion that Dr. Mitzi Hansen and I had about her case, but the patient was not available for the call.  She was a no-show for her appointment as well.

## 2023-04-17 ENCOUNTER — Telehealth: Payer: Self-pay | Admitting: Radiation Oncology

## 2023-04-17 ENCOUNTER — Inpatient Hospital Stay: Payer: Medicare Other | Admitting: Hematology and Oncology

## 2023-04-17 NOTE — Telephone Encounter (Signed)
 1/3 @ 9:06 am Left voicemail for patient to call our office to be reschedule for her missed appointment on yesterday.

## 2023-04-22 ENCOUNTER — Encounter: Payer: Self-pay | Admitting: Radiation Oncology

## 2023-04-22 ENCOUNTER — Ambulatory Visit: Payer: Medicare Other

## 2023-04-22 ENCOUNTER — Ambulatory Visit
Admission: RE | Admit: 2023-04-22 | Discharge: 2023-04-22 | Disposition: A | Discharge status: Home / Self Care | Payer: Medicare Other | Source: Ambulatory Visit | Attending: Radiation Oncology | Admitting: Radiation Oncology

## 2023-04-22 DIAGNOSIS — Z170 Estrogen receptor positive status [ER+]: Secondary | ICD-10-CM

## 2023-04-22 DIAGNOSIS — C50312 Malignant neoplasm of lower-inner quadrant of left female breast: Secondary | ICD-10-CM

## 2023-04-22 NOTE — Progress Notes (Signed)
 The patient was contacted by phone today.  She was unsure why she was being referred again back radiation as she recalled her encounter with Dr. Dewey  in May 2024 at which time it was not felt that additional definitive radiotherapy could be offered given her prior history of  radiation.  Fortunately, she has been tolerating systemic Kadcyla  quite well and has been counseled on the stability overall in her imaging when she was last seen by Dr. Odean in December.  She is interested in possibly seeing him for the future given that he offered additional imaging.  She is comfortable with the recommendations as they stand to avoid definitive dose radiation, but she is aware that we would be happy to review potential use of palliative therapy if needed in the future but locally she states that she is doing well and not having any concerns with pain or palpable or eroding changes along the chest wall.  I encouraged her to reach out if she has any questions or desires future follow-up in our clinic.     Donald KYM Husband, PAC

## 2023-04-22 NOTE — Progress Notes (Addendum)
 Telephone nursing appointment for Malignant neoplasm of lower-inner quadrant of left breast in female, estrogen receptor positive (HCC).   I verified patient's identity x2 and began nursing interview.   Patient reports doing well. Patient denies any related issues at this time.   Meaningful use complete.   Patient aware of their 1:30pm- 04/22/2023 telephone appointment w/ Donald Husband PA-C. I left my extension [phone] in case patient needs anything. Patient verbalized understanding. This concludes the nursing interview.   Patient contact [phone]     Rosaline Minerva, LPN

## 2023-04-24 ENCOUNTER — Other Ambulatory Visit: Payer: Self-pay | Admitting: *Deleted

## 2023-04-24 DIAGNOSIS — Z170 Estrogen receptor positive status [ER+]: Secondary | ICD-10-CM

## 2023-04-24 DIAGNOSIS — R599 Enlarged lymph nodes, unspecified: Secondary | ICD-10-CM

## 2023-04-24 DIAGNOSIS — R222 Localized swelling, mass and lump, trunk: Secondary | ICD-10-CM

## 2023-04-24 DIAGNOSIS — C50919 Malignant neoplasm of unspecified site of unspecified female breast: Secondary | ICD-10-CM

## 2023-04-28 ENCOUNTER — Ambulatory Visit (HOSPITAL_COMMUNITY): Payer: Medicare Other

## 2023-04-30 ENCOUNTER — Inpatient Hospital Stay: Payer: Medicare Other

## 2023-04-30 ENCOUNTER — Other Ambulatory Visit: Payer: Self-pay

## 2023-04-30 ENCOUNTER — Inpatient Hospital Stay: Payer: Medicare Other | Admitting: Hematology and Oncology

## 2023-04-30 ENCOUNTER — Inpatient Hospital Stay: Payer: Medicare Other | Attending: Adult Health

## 2023-04-30 ENCOUNTER — Encounter: Payer: Self-pay | Admitting: Hematology and Oncology

## 2023-04-30 VITALS — BP 161/73 | HR 100 | Temp 97.9°F | Resp 16 | Wt 171.4 lb

## 2023-04-30 DIAGNOSIS — R351 Nocturia: Secondary | ICD-10-CM | POA: Diagnosis not present

## 2023-04-30 DIAGNOSIS — I890 Lymphedema, not elsewhere classified: Secondary | ICD-10-CM | POA: Insufficient documentation

## 2023-04-30 DIAGNOSIS — Z170 Estrogen receptor positive status [ER+]: Secondary | ICD-10-CM | POA: Diagnosis not present

## 2023-04-30 DIAGNOSIS — Z5112 Encounter for antineoplastic immunotherapy: Secondary | ICD-10-CM | POA: Diagnosis present

## 2023-04-30 DIAGNOSIS — C50312 Malignant neoplasm of lower-inner quadrant of left female breast: Secondary | ICD-10-CM | POA: Insufficient documentation

## 2023-04-30 DIAGNOSIS — M1990 Unspecified osteoarthritis, unspecified site: Secondary | ICD-10-CM | POA: Insufficient documentation

## 2023-04-30 DIAGNOSIS — K5900 Constipation, unspecified: Secondary | ICD-10-CM | POA: Diagnosis not present

## 2023-04-30 DIAGNOSIS — C50919 Malignant neoplasm of unspecified site of unspecified female breast: Secondary | ICD-10-CM | POA: Diagnosis not present

## 2023-04-30 DIAGNOSIS — C7989 Secondary malignant neoplasm of other specified sites: Secondary | ICD-10-CM | POA: Diagnosis not present

## 2023-04-30 DIAGNOSIS — C773 Secondary and unspecified malignant neoplasm of axilla and upper limb lymph nodes: Secondary | ICD-10-CM | POA: Diagnosis present

## 2023-04-30 DIAGNOSIS — G629 Polyneuropathy, unspecified: Secondary | ICD-10-CM | POA: Diagnosis not present

## 2023-04-30 LAB — CMP (CANCER CENTER ONLY)
ALT: 18 U/L (ref 0–44)
AST: 29 U/L (ref 15–41)
Albumin: 3.9 g/dL (ref 3.5–5.0)
Alkaline Phosphatase: 85 U/L (ref 38–126)
Anion gap: 9 (ref 5–15)
BUN: 16 mg/dL (ref 8–23)
CO2: 28 mmol/L (ref 22–32)
Calcium: 9.8 mg/dL (ref 8.9–10.3)
Chloride: 102 mmol/L (ref 98–111)
Creatinine: 0.66 mg/dL (ref 0.44–1.00)
GFR, Estimated: >60 mL/min (ref >60)
Glucose, Bld: 220 mg/dL — ABNORMAL HIGH (ref 70–99)
Potassium: 3.8 mmol/L (ref 3.5–5.1)
Sodium: 139 mmol/L (ref 135–145)
Total Bilirubin: 0.5 mg/dL (ref 0.0–1.2)
Total Protein: 7.6 g/dL (ref 6.5–8.1)

## 2023-04-30 LAB — CBC WITH DIFFERENTIAL (CANCER CENTER ONLY)
Abs Immature Granulocytes: 0.01 10*3/uL (ref 0.00–0.07)
Basophils Absolute: 0.1 10*3/uL (ref 0.0–0.1)
Basophils Relative: 1 %
Eosinophils Absolute: 0.2 10*3/uL (ref 0.0–0.5)
Eosinophils Relative: 3 %
HCT: 39 % (ref 36.0–46.0)
Hemoglobin: 12.4 g/dL (ref 12.0–15.0)
Immature Granulocytes: 0 %
Lymphocytes Relative: 28 %
Lymphs Abs: 1.7 10*3/uL (ref 0.7–4.0)
MCH: 24.8 pg — ABNORMAL LOW (ref 26.0–34.0)
MCHC: 31.8 g/dL (ref 30.0–36.0)
MCV: 78.2 fL — ABNORMAL LOW (ref 80.0–100.0)
Monocytes Absolute: 0.4 10*3/uL (ref 0.1–1.0)
Monocytes Relative: 7 %
Neutro Abs: 3.6 10*3/uL (ref 1.7–7.7)
Neutrophils Relative %: 61 %
Platelet Count: 140 10*3/uL — ABNORMAL LOW (ref 150–400)
RBC: 4.99 MIL/uL (ref 3.87–5.11)
RDW: 16.7 % — ABNORMAL HIGH (ref 11.5–15.5)
WBC Count: 5.9 10*3/uL (ref 4.0–10.5)
nRBC: 0 % (ref 0.0–0.2)

## 2023-04-30 MED ORDER — HEPARIN SOD (PORK) LOCK FLUSH 100 UNIT/ML IV SOLN
500.0000 [IU] | Freq: Once | INTRAVENOUS | Status: AC | PRN
  Administered 2023-04-30: 500 [IU]

## 2023-04-30 MED ORDER — SODIUM CHLORIDE 0.9 % IV SOLN: Freq: Once | INTRAVENOUS | Status: AC

## 2023-04-30 MED ORDER — PROCHLORPERAZINE MALEATE 10 MG PO TABS
10.0000 mg | ORAL_TABLET | Freq: Once | ORAL | Status: AC
  Administered 2023-04-30: 10 mg via ORAL
  Filled 2023-04-30: qty 1

## 2023-04-30 MED ORDER — SODIUM CHLORIDE 0.9 % IV SOLN
3.6000 mg/kg | Freq: Once | INTRAVENOUS | Status: AC
  Administered 2023-04-30: 260 mg via INTRAVENOUS
  Filled 2023-04-30: qty 8

## 2023-04-30 MED ORDER — DIPHENHYDRAMINE HCL 25 MG PO CAPS
50.0000 mg | ORAL_CAPSULE | Freq: Once | ORAL | Status: AC
  Administered 2023-04-30: 50 mg via ORAL
  Filled 2023-04-30: qty 2

## 2023-04-30 MED ORDER — SODIUM CHLORIDE 0.9% FLUSH
10.0000 mL | INTRAVENOUS | Status: DC | PRN
Start: 2023-04-30 — End: 2023-04-30
  Administered 2023-04-30: 10 mL

## 2023-04-30 MED ORDER — ACETAMINOPHEN 325 MG PO TABS
650.0000 mg | ORAL_TABLET | Freq: Once | ORAL | Status: AC
  Administered 2023-04-30: 650 mg via ORAL
  Filled 2023-04-30: qty 2

## 2023-04-30 NOTE — Patient Instructions (Signed)
 [facility] - A DEPT OF [facility]  Discharge Instructions: Thank you for choosing [facility] to provide your oncology and hematology care.   If you have a lab appointment with the Cancer Center, please go directly to the Cancer Center and check in at the registration area.   Wear comfortable clothing and clothing appropriate for easy access to any Portacath or PICC line.   We strive to give you quality time with your provider. You may need to reschedule your appointment if you arrive late (15 or more minutes).  Arriving late affects you and other patients whose appointments are after yours.  Also, if you miss three or more appointments without notifying the office, you may be dismissed from the clinic at the provider's discretion.      For prescription refill requests, have your pharmacy contact our office and allow 72 hours for refills to be completed.    Today you received the following chemotherapy and/or immunotherapy agents: Kadcyla      To help prevent nausea and vomiting after your treatment, we encourage you to take your nausea medication as directed.  BELOW ARE SYMPTOMS THAT SHOULD BE REPORTED IMMEDIATELY: *FEVER GREATER THAN 100.4 F (38 C) OR HIGHER *CHILLS OR SWEATING *NAUSEA AND VOMITING THAT IS NOT CONTROLLED WITH YOUR NAUSEA MEDICATION *UNUSUAL SHORTNESS OF BREATH *UNUSUAL BRUISING OR BLEEDING *URINARY PROBLEMS (pain or burning when urinating, or frequent urination) *BOWEL PROBLEMS (unusual diarrhea, constipation, pain near the anus) TENDERNESS IN MOUTH AND THROAT WITH OR WITHOUT PRESENCE OF ULCERS (sore throat, sores in mouth, or a toothache) UNUSUAL RASH, SWELLING OR PAIN  UNUSUAL VAGINAL DISCHARGE OR ITCHING   Items with * indicate a potential emergency and should be followed up as soon as possible or go to the Emergency Department if any problems should occur.  Please show the CHEMOTHERAPY ALERT CARD or IMMUNOTHERAPY  ALERT CARD at check-in to the Emergency Department and triage nurse.  Should you have questions after your visit or need to cancel or reschedule your appointment, please contact [facility] - A DEPT OF [facility]  Dept: [phone]  and follow the prompts.  Office hours are 8:00 a.m. to 4:30 p.m. Monday - Friday. Please note that voicemails left after 4:00 p.m. may not be returned until the following business day.  We are closed weekends and major holidays. You have access to a nurse at all times for urgent questions. Please call the main number to the clinic Dept: [phone] and follow the prompts.   For any non-urgent questions, you may also contact your provider using MyChart. We now offer e-Visits for anyone 29 and older to request care online for non-urgent symptoms. For details visit [email or web address].   Also download the MyChart app! Go to the app store, search "MyChart", open the app, select , and log in with your MyChart username and password.

## 2023-04-30 NOTE — Progress Notes (Signed)
Patient Care Team: Pcp, No as PCP - General Otho Darner, MD as Consulting Physician (Family Medicine) Donnelly Angelica, RN as Oncology Nurse Navigator Pershing Proud, RN as Oncology Nurse Navigator Rachel Moulds, MD as Consulting Physician (Hematology and Oncology)  DIAGNOSIS:  No diagnosis found.   SUMMARY OF ONCOLOGIC HISTORY: Oncology History  Malignant neoplasm of lower-inner quadrant of left breast in female, estrogen receptor positive (HCC)  06/16/2013 Initial Diagnosis   Malignant neoplasm of lower-inner quadrant of left breast in female, estrogen receptor positive (HCC)   05/30/2021 - 04/03/2022 Chemotherapy   Patient is on Treatment Plan : BREAST Trastuzumab q28d      Genetic Testing   [facility] CancerNext-Expanded Panel was Negative. 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 Report date is 07/22/2021.  The CancerNext-Expanded gene panel offered by [facility] and includes sequencing, rearrangement, and RNA analysis for the following 77 genes: AIP, ALK, APC, ATM, AXIN2, BAP1, BARD1, BLM, BMPR1A, BRCA1, BRCA2, BRIP1, CDC73, CDH1, CDK4, CDKN1B, CDKN2A, CHEK2, CTNNA1, DICER1, FANCC, FH, FLCN, GALNT12, KIF1B, LZTR1, MAX, MEN1, MET, MLH1, MSH2, MSH3, MSH6, MUTYH, NBN, NF1, NF2, NTHL1, PALB2, PHOX2B, PMS2, POT1, PRKAR1A, PTCH1, PTEN, RAD51C, RAD51D, RB1, RECQL, RET, SDHA, SDHAF2, SDHB, SDHC, SDHD, SMAD4, SMARCA4, SMARCB1, SMARCE1, STK11, SUFU, TMEM127, TP53, TSC1, TSC2, VHL and XRCC2 (sequencing and deletion/duplication); EGFR, EGLN1, HOXB13, KIT, MITF, PDGFRA, POLD1, and POLE (sequencing only); EPCAM and GREM1 (deletion/duplication only).    05/01/2022 -  Chemotherapy   Patient is on Treatment Plan : BREAST ADO-Trastuzumab Emtansine (Kadcyla) q21d       CHIEF COMPLIANT: Follow-up on Kadcyla  HISTORY OF PRESENT ILLNESS: Discussed the use of AI scribe software for clinical note transcription with the patient, who gave verbal  consent to proceed.  History of Present Illness    The patient, with a history of HER2 positive breast cancer, presents for a follow-up visit. She has been receiving intravenous treatment for approximately two years. The current medication has been effective for a year, with the most recent CT scan in September showing slight improvement or at least no growth of the cancer. The patient reports no new symptoms or side effects, with the exception of weight loss over the past year, which she attributes to a loss of taste and smell, and decreased appetite since starting the current medication. She also reports constipation, which has been managed with stool softeners, and neuropathy in the shins, described as a burning sensation, particularly at night. The patient also has arthritis, which causes discomfort but not significant pain, and lymphedema, which has been ongoing and is managed with a machine used occasionally. She asks me the same question every time and what I think about the cancer's response so far. She is scheduled to have PET scan tomorrow.  ALLERGIES:  is allergic to bactrim, lisinopril, vasotec, and sulfamethoxazole-trimethoprim.  MEDICATIONS:  Current Outpatient Medications  Medication Sig Dispense Refill   acetaminophen (TYLENOL) 500 MG tablet Take 1,000 mg by mouth as needed for moderate pain.     amLODipine (NORVASC) 10 MG tablet TAKE 1 TABLET BY MOUTH DAILY 90 tablet 3   amoxicillin (AMOXIL) 500 MG capsule Take 2,000 mg by mouth as needed. Take 1 hour prior to Dentist Appt. (Patient not taking: Reported on 03/16/2023)     lidocaine (LIDODERM) 5 % Place 1 patch onto the skin daily. Remove & Discard patch within 12 hours or as directed by MD 7 patch 0   metFORMIN (GLUCOPHAGE) 500  MG tablet Take 500 mg by mouth daily with breakfast.     olmesartan-hydrochlorothiazide (BENICAR HCT) 40-12.5 MG tablet Take 1 tablet by mouth daily.     Turmeric (QC TUMERIC COMPLEX PO) Take 1 tablet by mouth  daily.     VITAMIN D PO Take 1 tablet by mouth daily.     No current facility-administered medications for this visit.    PHYSICAL EXAMINATION: ECOG PERFORMANCE STATUS: 1 - Symptomatic but completely ambulatory  Vitals:   04/30/23 1103  BP: (!) 161/73  Pulse: 100  Resp: 16  Temp: 97.9 F (36.6 C)  SpO2: 100%   Filed Weights   04/30/23 1103  Weight: 171 lb 6.4 oz (77.7 kg)   Physical Exam Constitutional:      Appearance: Normal appearance.  Cardiovascular:     Rate and Rhythm: Normal rate and regular rhythm.  Pulmonary:     Effort: Pulmonary effort is normal.     Breath sounds: Normal breath sounds.  Abdominal:     General: Abdomen is flat.     Palpations: Abdomen is soft.  Musculoskeletal:     Cervical back: Normal range of motion. No rigidity.  Lymphadenopathy:     Cervical: No cervical adenopathy.  Skin:    General: Skin is warm and dry.  Neurological:     General: No focal deficit present.     Mental Status: She is alert.  Psychiatric:        Mood and Affect: Mood normal.       LABORATORY DATA:  I have reviewed the data as listed    Latest Ref Rng & Units 04/09/2023    8:16 AM 03/20/2023   12:18 PM 02/26/2023   11:14 AM  CMP  Glucose 70 - 99 mg/dL 161  096  045   BUN 8 - 23 mg/dL 19  18  17    Creatinine 0.44 - 1.00 mg/dL 4.09  8.11  9.14   Sodium 135 - 145 mmol/L 139  138  139   Potassium 3.5 - 5.1 mmol/L 3.7  3.8  3.9   Chloride 98 - 111 mmol/L 105  103  103   CO2 22 - 32 mmol/L 28  28  29    Calcium 8.9 - 10.3 mg/dL 9.6  9.4  9.6   Total Protein 6.5 - 8.1 g/dL 7.5  7.1  7.5   Total Bilirubin <1.2 mg/dL 0.5  0.5  0.5   Alkaline Phos 38 - 126 U/L 76  84  67   AST 15 - 41 U/L 25  27  28    ALT 0 - 44 U/L 14  16  15      Lab Results  Component Value Date   WBC 4.3 04/09/2023   HGB 12.0 04/09/2023   HCT 38.2 04/09/2023   MCV 78.8 (L) 04/09/2023   PLT 180 04/09/2023   NEUTROABS 2.2 04/09/2023    ASSESSMENT & PLAN:  Malignant neoplasm of  lower-inner quadrant of left breast in female, estrogen receptor positive (HCC) 1993: Right lumpectomy axillary dissection in [place] (stage I) treated with radiation and tamoxifen x 5 years 2002: Right mastectomy: Right breast recurrence 2004: Left mastectomy: 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 PET scan scheduled to assess for metabolic activity of the disease. If PET scan shows inactive disease, considering a "chemo holiday" to alleviate side effects. -Continue current therapy until PET scan results are available. -Review PET scan results when available and discuss next steps based on findings.  Arthritis Chronic discomfort in shoulder and knee, managed with rest and occasional Tylenol. -Continue current management strategy.  Constipation Improved with stool softeners. -Continue stool softeners as needed.  Neuropathy Burning sensation in shins, likely medication-induced. No current need for additional treatment. -Monitor symptoms.  Lymphedema Chronic condition, managed with occasional use of a machine. -Continue current management strategy.  Nocturia Frequent urination at night due to high water intake. -Advise to limit water intake in the later part of the day to reduce nocturia.  Follow-up after PET scan results are available.  No orders of the defined types were placed in this encounter.  The patient has a good understanding of the overall plan. she agrees with it. she will call with any problems that may  develop before the next visit here. Total time spent: 30 mins including face to face time and time spent for planning, charting and co-ordination of care   Rachel Moulds, MD 04/30/23

## 2023-05-01 ENCOUNTER — Encounter (HOSPITAL_COMMUNITY)
Admission: RE | Admit: 2023-05-01 | Discharge: 2023-05-01 | Disposition: A | Discharge status: Home / Self Care | Payer: Medicare Other | Source: Ambulatory Visit | Attending: Hematology and Oncology

## 2023-05-01 DIAGNOSIS — C7989 Secondary malignant neoplasm of other specified sites: Secondary | ICD-10-CM | POA: Insufficient documentation

## 2023-05-01 DIAGNOSIS — R599 Enlarged lymph nodes, unspecified: Secondary | ICD-10-CM | POA: Diagnosis present

## 2023-05-01 DIAGNOSIS — Z170 Estrogen receptor positive status [ER+]: Secondary | ICD-10-CM | POA: Diagnosis present

## 2023-05-01 DIAGNOSIS — C50919 Malignant neoplasm of unspecified site of unspecified female breast: Secondary | ICD-10-CM | POA: Insufficient documentation

## 2023-05-01 DIAGNOSIS — C50312 Malignant neoplasm of lower-inner quadrant of left female breast: Secondary | ICD-10-CM | POA: Insufficient documentation

## 2023-05-01 LAB — GLUCOSE, CAPILLARY: Glucose-Capillary: 110 mg/dL — ABNORMAL HIGH (ref 70–99)

## 2023-05-01 MED ORDER — FLUDEOXYGLUCOSE F - 18 (FDG) INJECTION
8.1300 | Freq: Once | INTRAVENOUS | Status: AC
  Administered 2023-05-01: 8.13 via INTRAVENOUS

## 2023-05-08 ENCOUNTER — Telehealth: Payer: Self-pay | Admitting: *Deleted

## 2023-05-08 NOTE — Telephone Encounter (Signed)
This RN attempted to call pt per MD review of PET - showing overall stable disease  - area noted in rib reviewed with radiologist and can be monitored.  Recommendation at this time is to continue on current therapy- and will continue to monitor.  Obtained VM- message left to return call to this RN

## 2023-05-12 IMAGING — CT CT ABD-PELV W/ CM
2 of 5 series · 16 of 46 positions shown, 18 images · IV contrast (APPLIED)
Comparison: 06/16/2012

CLINICAL DATA: Abdominal pain, acute, nonlocalized. Nausea and
vomiting

EXAM:
CT ABDOMEN AND PELVIS WITH CONTRAST
TECHNIQUE: Multidetector CT imaging of the abdomen and pelvis was performed
using the standard protocol following bolus administration of
intravenous contrast.

[Series 2: abd pel w · axial · 0.97mm/px · z∈[-395,+10]mm · 13 of 91 slices shown, 15 images]
[im 5/91  soft-tissue]
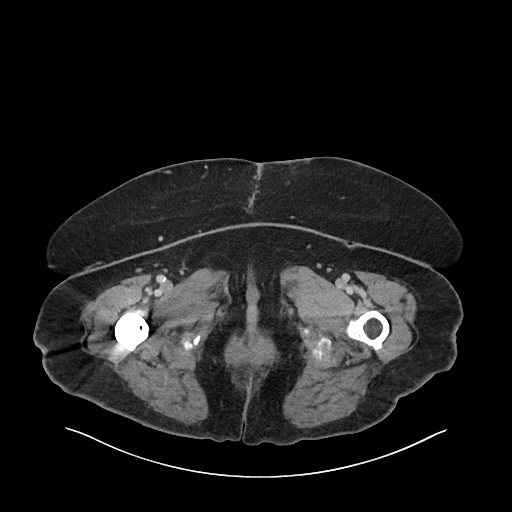
[im 5/91  bone]
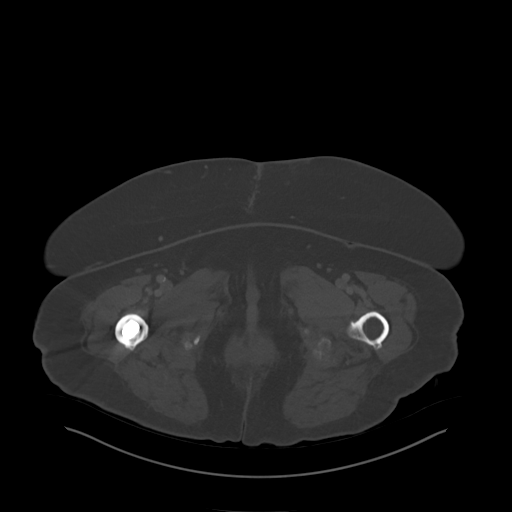
[im 15/91  soft-tissue]
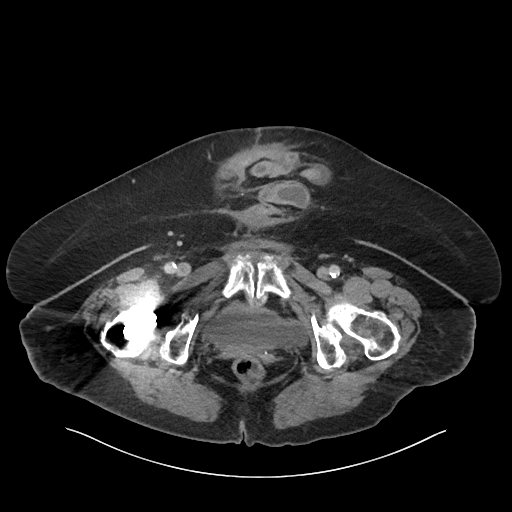
[im 19/91  soft-tissue]
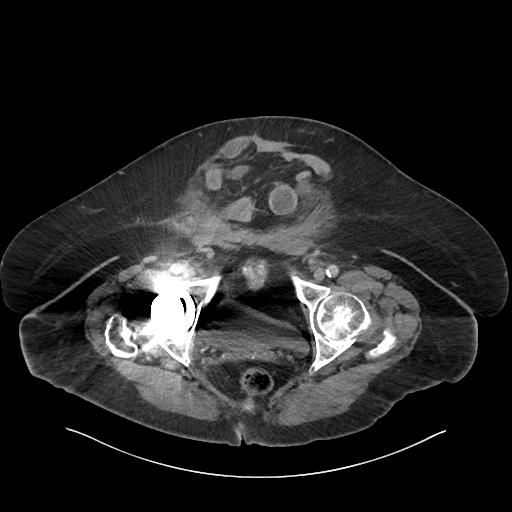
[im 24/91  soft-tissue]
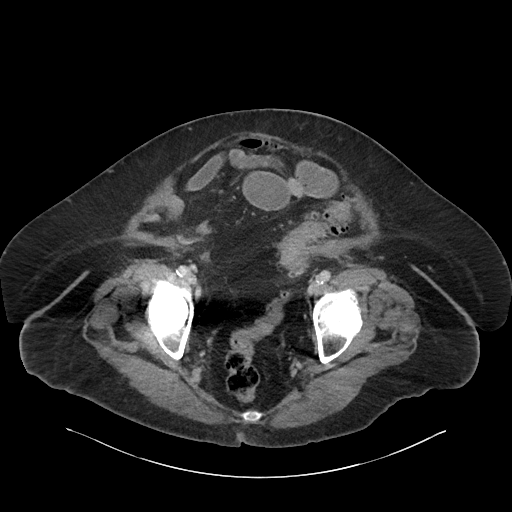
[im 34/91  soft-tissue]
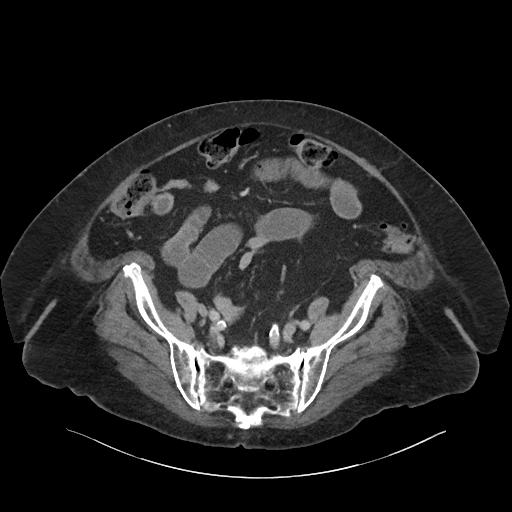
[im 38/91  soft-tissue]
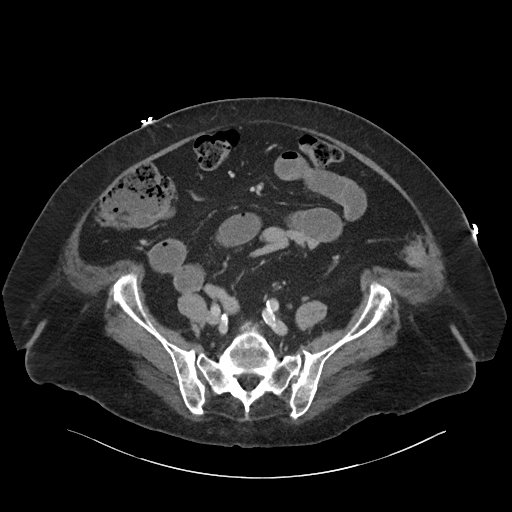
[im 48/91  soft-tissue]
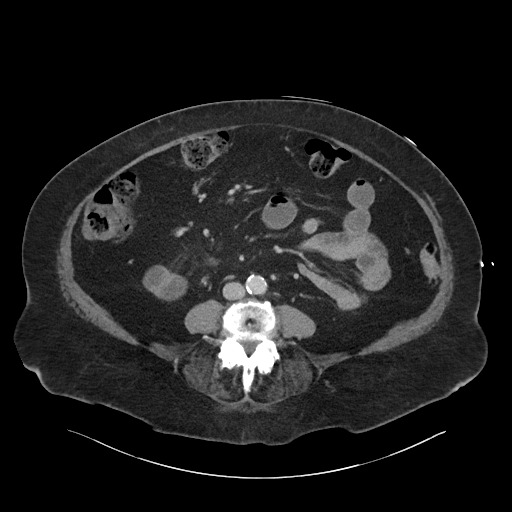
[im 53/91  soft-tissue]
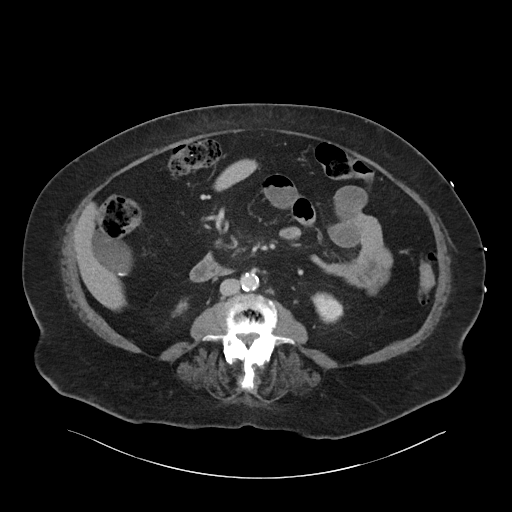
[im 57/91  soft-tissue]
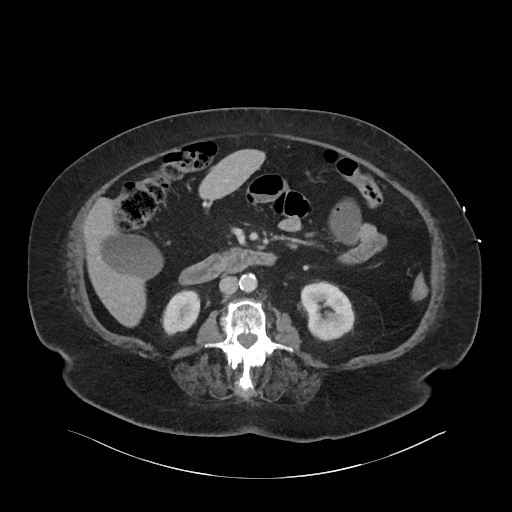
[im 57/91  bone]
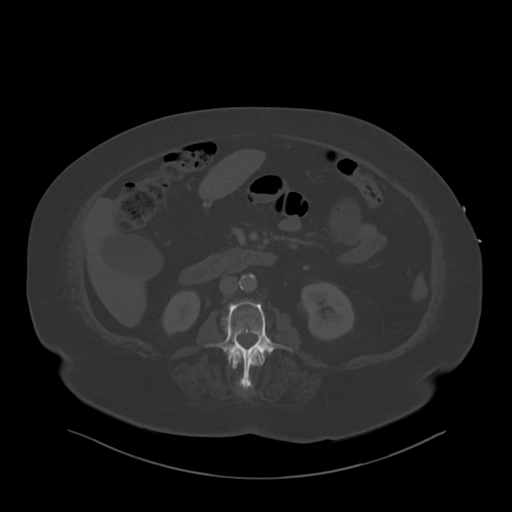
[im 67/91  soft-tissue]
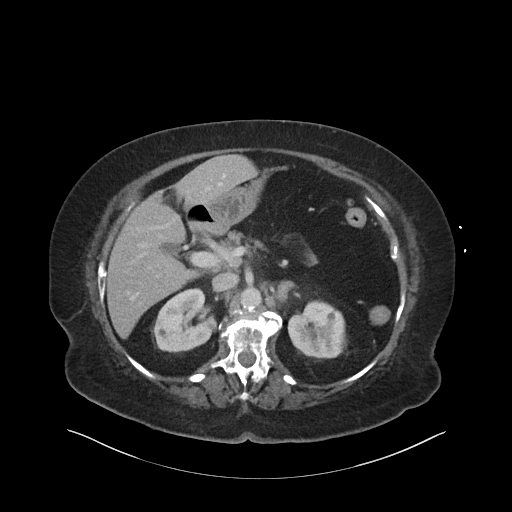
[im 72/91  soft-tissue]
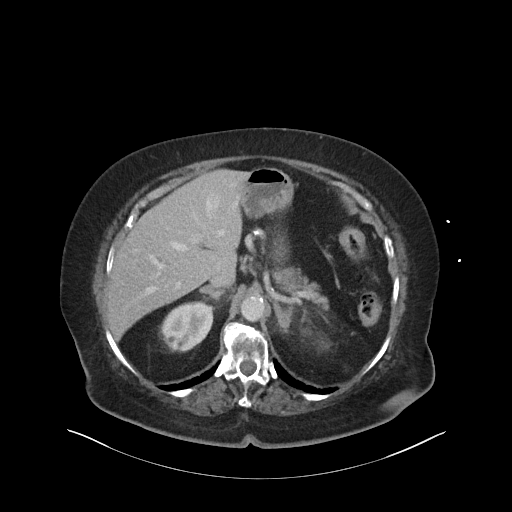
[im 76/91  soft-tissue]
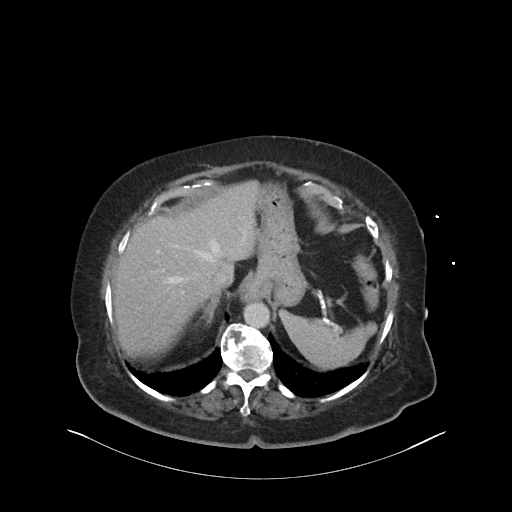
[im 86/91  soft-tissue]
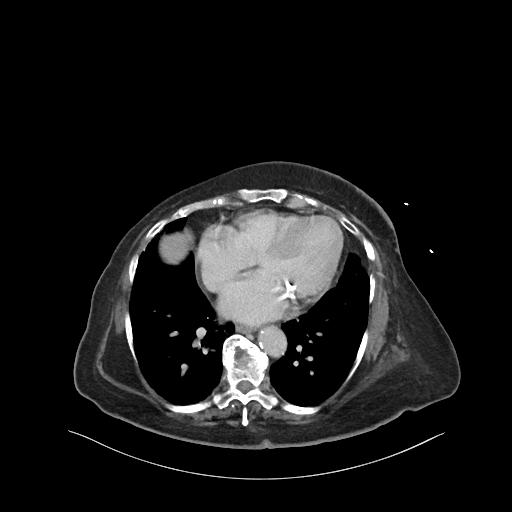

[Series 5: coronal · coronal · 0.88mm/px · 3 of 115 slices shown]
[im 39/115  soft-tissue]
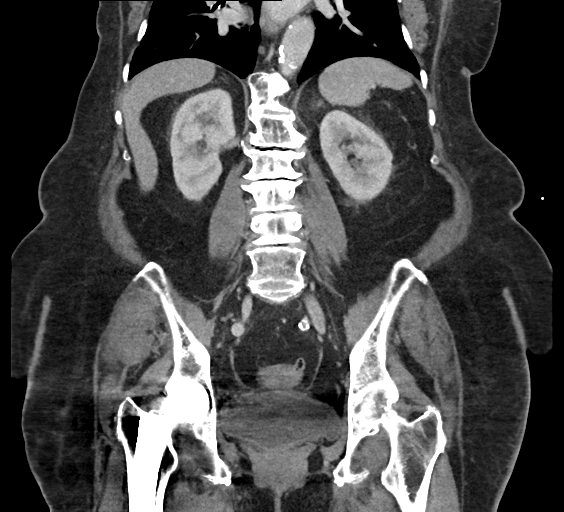
[im 51/115  soft-tissue]
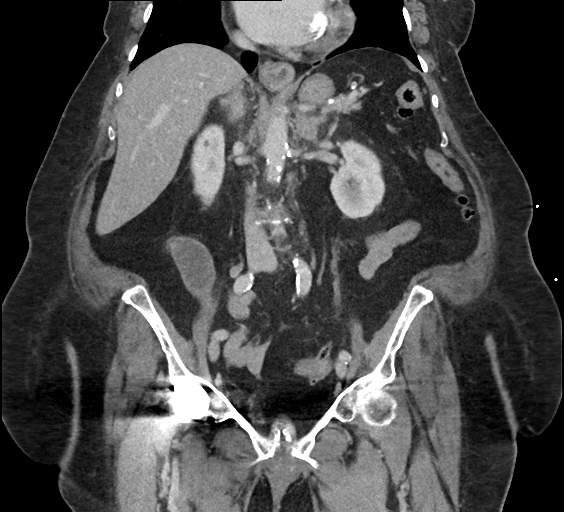
[im 64/115  soft-tissue]
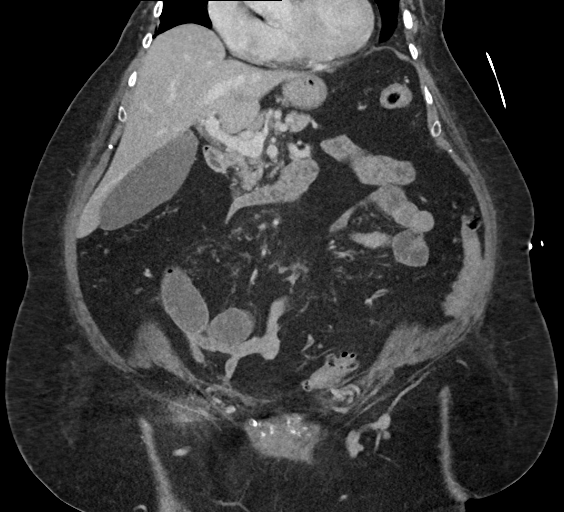

[16 of 46 positions shown; findings below may reference images not displayed]

RADIATION DOSE REDUCTION: This exam was performed according to the
departmental dose-optimization program which includes automated
exposure control, adjustment of the mA and/or kV according to
patient size and/or use of iterative reconstruction technique.

CONTRAST:  100mL OMNIPAQUE IOHEXOL 300 MG/ML  SOLN
FINDINGS: Lower chest: Cardiomegaly.  Included lung bases are clear.

Hepatobiliary: No focal liver abnormality. There are a few small
layering stones within the gallbladder lumen. No pericholecystic
inflammatory changes by CT. No biliary dilatation.

Pancreas: Unremarkable. No pancreatic ductal dilatation or
surrounding inflammatory changes.

Spleen: Normal in size without focal abnormality.

Adrenals/Urinary Tract: Adrenal glands within normal limits.
Multiple subcentimeter low-density lesions within both kidneys, too
small to definitively characterized. No renal stone or
hydronephrosis. Urinary bladder within normal limits for the degree
of distension.

Stomach/Bowel: Small hiatal hernia. Stomach otherwise within normal
limits. Small-bowel obstruction with transition point located within
patient's inferior ventral abdominal wall hernia (series 5, image
84). Fecalization of small bowel content upstream from the site of
obstruction. The small bowel and colon distal to the site of
obstruction is decompressed. Extensive diverticulosis, most
pronounced within the sigmoid colon

Vascular/Lymphatic: Aortic atherosclerosis. No enlarged abdominal or
pelvic lymph nodes.

Reproductive: Status post hysterectomy. No adnexal masses.

Other: Inferior ventral abdominal wall hernia with scarring along
the anteroinferior margin of the hernia sac. No ascites. No
organized abdominopelvic fluid collection. No pneumoperitoneum.

Musculoskeletal: Partially visualized right hip arthroplasty.
Advanced degenerative changes of the pubic symphysis with small
joint effusion, nonspecific. Advanced degenerative disc disease and
facet arthropathy of the lumbar spine. Multilevel bridging endplate
osteophytes within the lower thoracic spine.
IMPRESSION: 1. Small-bowel obstruction with transition point located within
patient's inferior ventral abdominal wall hernia.
2. Extensive colonic diverticulosis without evidence of acute
diverticulitis.
3. Cholelithiasis without evidence of acute cholecystitis.

Aortic Atherosclerosis (M9IFH-3FS.S).

## 2023-05-13 ENCOUNTER — Other Ambulatory Visit: Payer: Self-pay

## 2023-05-13 IMAGING — DX DG ABD PORTABLE 1V
1 series · 1 of 1 positions shown · non-contrast
Comparison: None.

CLINICAL DATA: Gastrografin given at 9324, there was
miscommunication about when the contrast was given so patient missed
their 8 hour delay.

EXAM:
PORTABLE ABDOMEN - 1 VIEW

[abdomen kub]
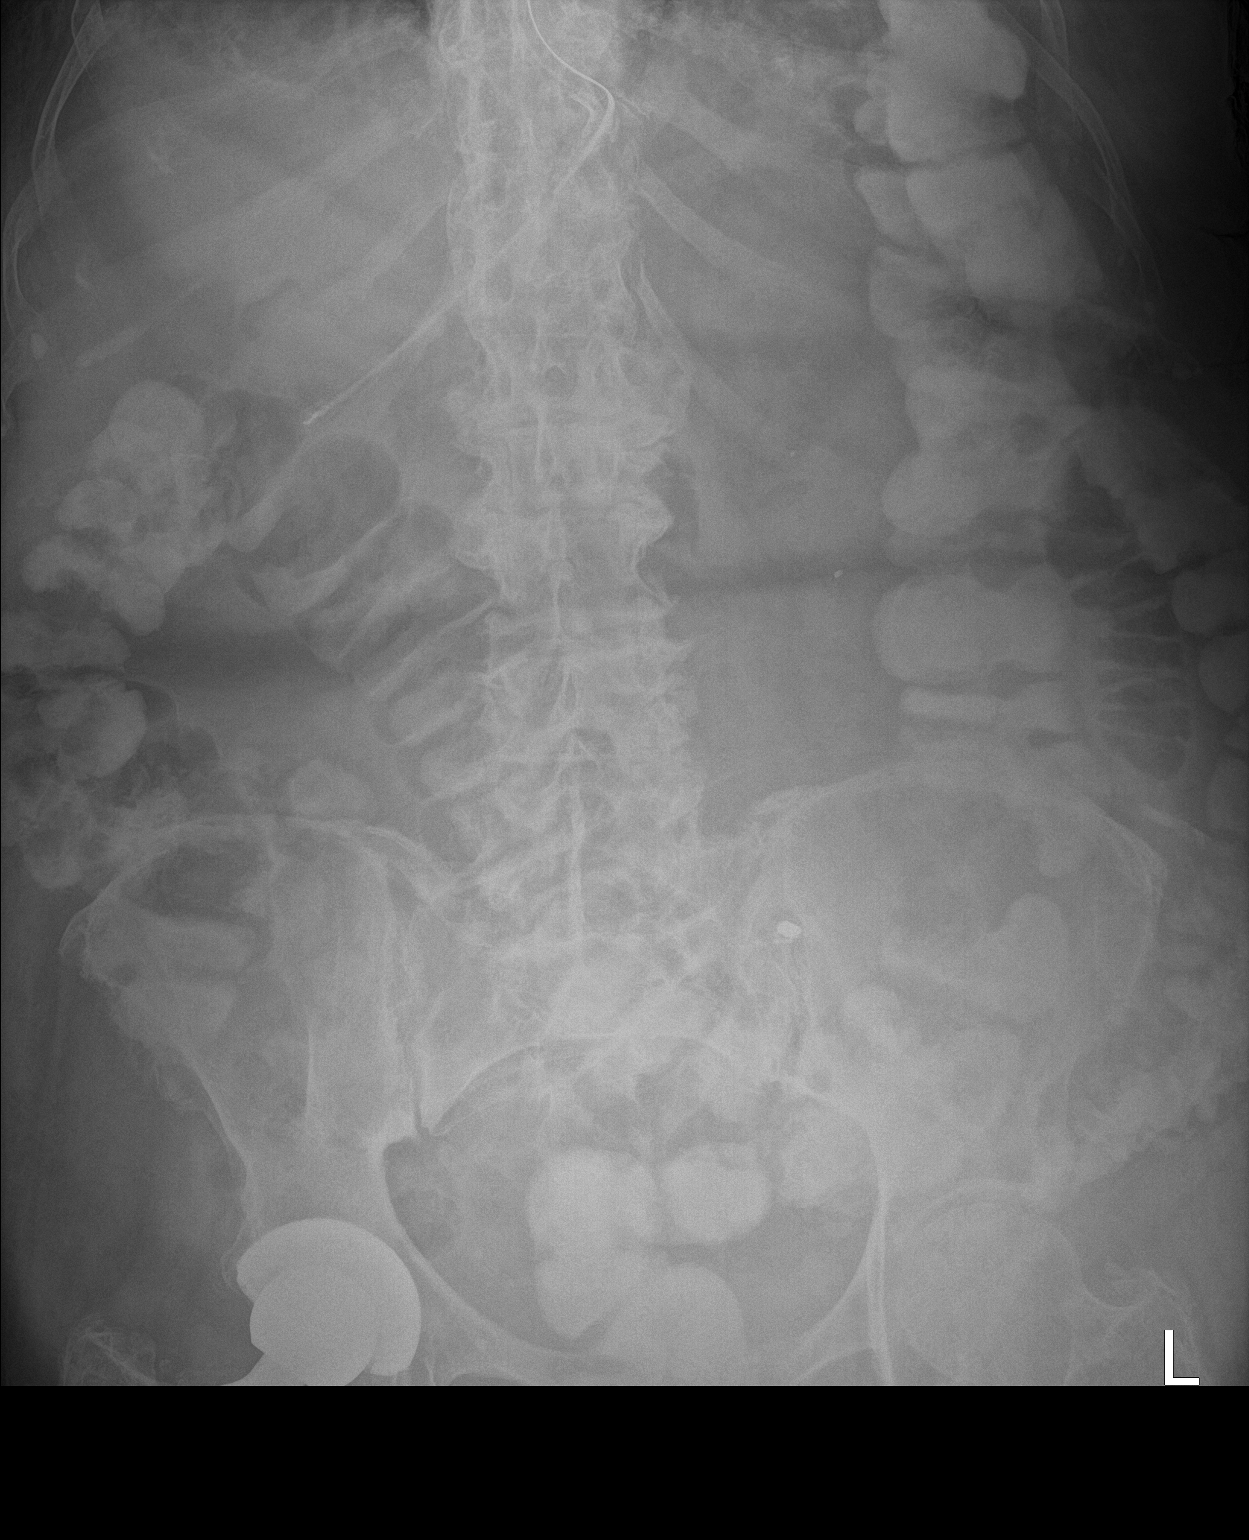

[1 of 1 positions shown; findings below may reference images not displayed]

FINDINGS: PO contrast reaches the rectum. The bowel gas pattern is normal. No
radio-opaque calculi or other significant radiographic abnormality
are seen.

Enteric tube with tip coursing below the hemidiaphragm overlying the
expected region of the gastric antrum/pyloric region.

Partially visualized total right hip arthroplasty.
IMPRESSION: 1. PO contrast reaches the rectum.
2. Enteric tube in grossly appropriate position.

## 2023-05-21 ENCOUNTER — Inpatient Hospital Stay (HOSPITAL_COMMUNITY): Payer: Medicare Other

## 2023-05-21 ENCOUNTER — Inpatient Hospital Stay: Payer: Medicare Other

## 2023-05-21 ENCOUNTER — Encounter (HOSPITAL_COMMUNITY): Payer: Self-pay

## 2023-05-21 ENCOUNTER — Inpatient Hospital Stay: Payer: Medicare Other | Admitting: Hematology and Oncology

## 2023-05-21 ENCOUNTER — Other Ambulatory Visit: Payer: Self-pay

## 2023-05-21 ENCOUNTER — Inpatient Hospital Stay (HOSPITAL_COMMUNITY)
Admission: EM | Admit: 2023-05-21 | Discharge: 2023-05-24 | DRG: 811 | Disposition: A | Discharge status: Home / Self Care | Payer: Medicare Other | Attending: Internal Medicine | Admitting: Internal Medicine

## 2023-05-21 DIAGNOSIS — E871 Hypo-osmolality and hyponatremia: Secondary | ICD-10-CM | POA: Diagnosis present

## 2023-05-21 DIAGNOSIS — E1165 Type 2 diabetes mellitus with hyperglycemia: Secondary | ICD-10-CM | POA: Diagnosis present

## 2023-05-21 DIAGNOSIS — D62 Acute posthemorrhagic anemia: Principal | ICD-10-CM

## 2023-05-21 DIAGNOSIS — K625 Hemorrhage of anus and rectum: Secondary | ICD-10-CM | POA: Diagnosis not present

## 2023-05-21 DIAGNOSIS — Z96641 Presence of right artificial hip joint: Secondary | ICD-10-CM | POA: Diagnosis present

## 2023-05-21 DIAGNOSIS — Z9013 Acquired absence of bilateral breasts and nipples: Secondary | ICD-10-CM

## 2023-05-21 DIAGNOSIS — Z79899 Other long term (current) drug therapy: Secondary | ICD-10-CM | POA: Diagnosis not present

## 2023-05-21 DIAGNOSIS — Z888 Allergy status to other drugs, medicaments and biological substances status: Secondary | ICD-10-CM | POA: Diagnosis not present

## 2023-05-21 DIAGNOSIS — C7989 Secondary malignant neoplasm of other specified sites: Secondary | ICD-10-CM

## 2023-05-21 DIAGNOSIS — G8929 Other chronic pain: Secondary | ICD-10-CM | POA: Diagnosis present

## 2023-05-21 DIAGNOSIS — I10 Essential (primary) hypertension: Secondary | ICD-10-CM | POA: Diagnosis present

## 2023-05-21 DIAGNOSIS — C50919 Malignant neoplasm of unspecified site of unspecified female breast: Secondary | ICD-10-CM | POA: Diagnosis not present

## 2023-05-21 DIAGNOSIS — K5909 Other constipation: Secondary | ICD-10-CM | POA: Diagnosis present

## 2023-05-21 DIAGNOSIS — K922 Gastrointestinal hemorrhage, unspecified: Secondary | ICD-10-CM | POA: Diagnosis present

## 2023-05-21 DIAGNOSIS — E872 Acidosis, unspecified: Secondary | ICD-10-CM | POA: Diagnosis present

## 2023-05-21 DIAGNOSIS — I890 Lymphedema, not elsewhere classified: Secondary | ICD-10-CM | POA: Diagnosis present

## 2023-05-21 DIAGNOSIS — I959 Hypotension, unspecified: Secondary | ICD-10-CM | POA: Diagnosis present

## 2023-05-21 DIAGNOSIS — Z803 Family history of malignant neoplasm of breast: Secondary | ICD-10-CM

## 2023-05-21 DIAGNOSIS — R0902 Hypoxemia: Secondary | ICD-10-CM | POA: Diagnosis present

## 2023-05-21 DIAGNOSIS — C50912 Malignant neoplasm of unspecified site of left female breast: Secondary | ICD-10-CM | POA: Diagnosis present

## 2023-05-21 DIAGNOSIS — C771 Secondary and unspecified malignant neoplasm of intrathoracic lymph nodes: Secondary | ICD-10-CM | POA: Diagnosis present

## 2023-05-21 DIAGNOSIS — Z923 Personal history of irradiation: Secondary | ICD-10-CM

## 2023-05-21 DIAGNOSIS — Z7984 Long term (current) use of oral hypoglycemic drugs: Secondary | ICD-10-CM

## 2023-05-21 DIAGNOSIS — Z90710 Acquired absence of both cervix and uterus: Secondary | ICD-10-CM | POA: Diagnosis not present

## 2023-05-21 DIAGNOSIS — Z86711 Personal history of pulmonary embolism: Secondary | ICD-10-CM | POA: Diagnosis not present

## 2023-05-21 DIAGNOSIS — C7951 Secondary malignant neoplasm of bone: Secondary | ICD-10-CM | POA: Diagnosis present

## 2023-05-21 DIAGNOSIS — Z8049 Family history of malignant neoplasm of other genital organs: Secondary | ICD-10-CM

## 2023-05-21 DIAGNOSIS — K5733 Diverticulitis of large intestine without perforation or abscess with bleeding: Secondary | ICD-10-CM | POA: Diagnosis present

## 2023-05-21 DIAGNOSIS — Z87891 Personal history of nicotine dependence: Secondary | ICD-10-CM | POA: Diagnosis not present

## 2023-05-21 DIAGNOSIS — E119 Type 2 diabetes mellitus without complications: Secondary | ICD-10-CM

## 2023-05-21 DIAGNOSIS — E7800 Pure hypercholesterolemia, unspecified: Secondary | ICD-10-CM | POA: Diagnosis present

## 2023-05-21 DIAGNOSIS — Z881 Allergy status to other antibiotic agents status: Secondary | ICD-10-CM | POA: Diagnosis not present

## 2023-05-21 DIAGNOSIS — K5791 Diverticulosis of intestine, part unspecified, without perforation or abscess with bleeding: Secondary | ICD-10-CM | POA: Diagnosis not present

## 2023-05-21 DIAGNOSIS — Z9221 Personal history of antineoplastic chemotherapy: Secondary | ICD-10-CM

## 2023-05-21 DIAGNOSIS — E876 Hypokalemia: Secondary | ICD-10-CM | POA: Diagnosis present

## 2023-05-21 DIAGNOSIS — Z1731 Human epidermal growth factor receptor 2 positive status: Secondary | ICD-10-CM

## 2023-05-21 LAB — COMPREHENSIVE METABOLIC PANEL
ALT: 17 U/L (ref 0–44)
AST: 28 U/L (ref 15–41)
Albumin: 2.9 g/dL — ABNORMAL LOW (ref 3.5–5.0)
Alkaline Phosphatase: 65 U/L (ref 38–126)
Anion gap: 12 (ref 5–15)
BUN: 21 mg/dL (ref 8–23)
CO2: 19 mmol/L — ABNORMAL LOW (ref 22–32)
Calcium: 8.3 mg/dL — ABNORMAL LOW (ref 8.9–10.3)
Chloride: 101 mmol/L (ref 98–111)
Creatinine, Ser: 0.67 mg/dL (ref 0.44–1.00)
GFR, Estimated: >60 mL/min (ref >60)
Glucose, Bld: 223 mg/dL — ABNORMAL HIGH (ref 70–99)
Potassium: 3.1 mmol/L — ABNORMAL LOW (ref 3.5–5.1)
Sodium: 132 mmol/L — ABNORMAL LOW (ref 135–145)
Total Bilirubin: 0.5 mg/dL (ref 0.0–1.2)
Total Protein: 6 g/dL — ABNORMAL LOW (ref 6.5–8.1)

## 2023-05-21 LAB — CBC WITH DIFFERENTIAL/PLATELET
Abs Immature Granulocytes: 0.04 10*3/uL (ref 0.00–0.07)
Basophils Absolute: 0 10*3/uL (ref 0.0–0.1)
Basophils Relative: 1 %
Eosinophils Absolute: 0.1 10*3/uL (ref 0.0–0.5)
Eosinophils Relative: 1 %
HCT: 27.2 % — ABNORMAL LOW (ref 36.0–46.0)
Hemoglobin: 8.5 g/dL — ABNORMAL LOW (ref 12.0–15.0)
Immature Granulocytes: 1 %
Lymphocytes Relative: 18 %
Lymphs Abs: 1.1 10*3/uL (ref 0.7–4.0)
MCH: 24.8 pg — ABNORMAL LOW (ref 26.0–34.0)
MCHC: 31.3 g/dL (ref 30.0–36.0)
MCV: 79.3 fL — ABNORMAL LOW (ref 80.0–100.0)
Monocytes Absolute: 0.4 10*3/uL (ref 0.1–1.0)
Monocytes Relative: 6 %
Neutro Abs: 4.4 10*3/uL (ref 1.7–7.7)
Neutrophils Relative %: 73 %
Platelets: 167 10*3/uL (ref 150–400)
RBC: 3.43 MIL/uL — ABNORMAL LOW (ref 3.87–5.11)
RDW: 16.3 % — ABNORMAL HIGH (ref 11.5–15.5)
WBC: 6 10*3/uL (ref 4.0–10.5)
nRBC: 0 % (ref 0.0–0.2)

## 2023-05-21 LAB — BASIC METABOLIC PANEL
Anion gap: 9 (ref 5–15)
BUN: 19 mg/dL (ref 8–23)
CO2: 21 mmol/L — ABNORMAL LOW (ref 22–32)
Calcium: 7.7 mg/dL — ABNORMAL LOW (ref 8.9–10.3)
Chloride: 109 mmol/L (ref 98–111)
Creatinine, Ser: 0.52 mg/dL (ref 0.44–1.00)
GFR, Estimated: >60 mL/min (ref >60)
Glucose, Bld: 126 mg/dL — ABNORMAL HIGH (ref 70–99)
Potassium: 3 mmol/L — ABNORMAL LOW (ref 3.5–5.1)
Sodium: 139 mmol/L (ref 135–145)

## 2023-05-21 LAB — PROTIME-INR
INR: 1.3 — ABNORMAL HIGH (ref 0.8–1.2)
Prothrombin Time: 16.2 s — ABNORMAL HIGH (ref 11.4–15.2)

## 2023-05-21 LAB — HEMOGLOBIN AND HEMATOCRIT, BLOOD
HCT: 24.2 % — ABNORMAL LOW (ref 36.0–46.0)
HCT: 31.2 % — ABNORMAL LOW (ref 36.0–46.0)
HCT: 29.4 % — ABNORMAL LOW (ref 36.0–46.0)
Hemoglobin: 7.6 g/dL — ABNORMAL LOW (ref 12.0–15.0)
Hemoglobin: 10 g/dL — ABNORMAL LOW (ref 12.0–15.0)
Hemoglobin: 9.6 g/dL — ABNORMAL LOW (ref 12.0–15.0)

## 2023-05-21 LAB — CBG MONITORING, ED
Glucose-Capillary: 139 mg/dL — ABNORMAL HIGH (ref 70–99)
Glucose-Capillary: 97 mg/dL (ref 70–99)
Glucose-Capillary: 87 mg/dL (ref 70–99)

## 2023-05-21 LAB — PREPARE RBC (CROSSMATCH)

## 2023-05-21 LAB — MRSA NEXT GEN BY PCR, NASAL: MRSA by PCR Next Gen: NOT DETECTED

## 2023-05-21 LAB — GLUCOSE, CAPILLARY: Glucose-Capillary: 86 mg/dL (ref 70–99)

## 2023-05-21 MED ORDER — SODIUM CHLORIDE 0.9 % IV BOLUS
1000.0000 mL | Freq: Once | INTRAVENOUS | Status: AC
  Administered 2023-05-21: 1000 mL via INTRAVENOUS

## 2023-05-21 MED ORDER — IOHEXOL 350 MG/ML SOLN
100.0000 mL | Freq: Once | INTRAVENOUS | Status: AC | PRN
  Administered 2023-05-21: 100 mL via INTRAVENOUS

## 2023-05-21 MED ORDER — POTASSIUM CHLORIDE 10 MEQ/100ML IV SOLN
10.0000 meq | INTRAVENOUS | Status: AC
  Administered 2023-05-21 (×3): 10 meq via INTRAVENOUS
  Filled 2023-05-21: qty 100

## 2023-05-21 MED ORDER — CHLORHEXIDINE GLUCONATE CLOTH 2 % EX PADS
6.0000 | MEDICATED_PAD | Freq: Every day | CUTANEOUS | Status: DC
  Administered 2023-05-21 – 2023-05-24 (×4): 6 via TOPICAL

## 2023-05-21 MED ORDER — SODIUM CHLORIDE 0.9% FLUSH
10.0000 mL | Freq: Two times a day (BID) | INTRAVENOUS | Status: DC
  Administered 2023-05-21: 30 mL
  Administered 2023-05-22 (×2): 10 mL
  Administered 2023-05-23: 20 mL
  Administered 2023-05-23 – 2023-05-24 (×2): 10 mL

## 2023-05-21 MED ORDER — SODIUM CHLORIDE 0.9% IV SOLUTION: Freq: Once | INTRAVENOUS | Status: AC

## 2023-05-21 MED ORDER — INSULIN ASPART 100 UNIT/ML IJ SOLN
0.0000 [IU] | INTRAMUSCULAR | Status: DC
  Administered 2023-05-21: 2 [IU] via SUBCUTANEOUS
  Administered 2023-05-21: 5 [IU] via SUBCUTANEOUS
  Administered 2023-05-22: 3 [IU] via SUBCUTANEOUS
  Administered 2023-05-22: 5 [IU] via SUBCUTANEOUS
  Filled 2023-05-21: qty 0.15

## 2023-05-21 MED ORDER — ONDANSETRON HCL 4 MG PO TABS: 4.0000 mg | ORAL_TABLET | Freq: Four times a day (QID) | ORAL | Status: DC | PRN

## 2023-05-21 MED ORDER — ORAL CARE MOUTH RINSE: 15.0000 mL | OROMUCOSAL | Status: DC | PRN

## 2023-05-21 MED ORDER — PANTOPRAZOLE SODIUM 40 MG IV SOLR: 40.0000 mg | INTRAVENOUS | Status: DC

## 2023-05-21 MED ORDER — PANTOPRAZOLE SODIUM 40 MG IV SOLR
40.0000 mg | INTRAVENOUS | Status: DC
Start: 2023-05-21 — End: 2023-05-24
  Administered 2023-05-21 – 2023-05-24 (×4): 40 mg via INTRAVENOUS
  Filled 2023-05-21 (×4): qty 10

## 2023-05-21 MED ORDER — POTASSIUM CHLORIDE IN NACL 20-0.9 MEQ/L-% IV SOLN
INTRAVENOUS | Status: AC
  Filled 2023-05-21 (×2): qty 1000

## 2023-05-21 MED ORDER — ONDANSETRON HCL 4 MG/2ML IJ SOLN: 4.0000 mg | Freq: Four times a day (QID) | INTRAMUSCULAR | Status: DC | PRN

## 2023-05-21 MED ORDER — HYDRALAZINE HCL 20 MG/ML IJ SOLN: 10.0000 mg | Freq: Four times a day (QID) | INTRAMUSCULAR | Status: DC | PRN

## 2023-05-21 MED ORDER — SODIUM CHLORIDE 0.9% FLUSH
10.0000 mL | INTRAVENOUS | Status: DC | PRN
  Administered 2023-05-23 – 2023-05-24 (×2): 20 mL

## 2023-05-21 MED ORDER — PANTOPRAZOLE SODIUM 40 MG IV SOLR: 40.0000 mg | Freq: Two times a day (BID) | INTRAVENOUS | Status: DC

## 2023-05-21 MED ORDER — ACETAMINOPHEN 325 MG PO TABS: 650.0000 mg | ORAL_TABLET | Freq: Four times a day (QID) | ORAL | Status: DC | PRN

## 2023-05-21 MED ORDER — ACETAMINOPHEN 650 MG RE SUPP: 650.0000 mg | Freq: Four times a day (QID) | RECTAL | Status: DC | PRN

## 2023-05-21 NOTE — ED Notes (Signed)
GI MD at [facility].

## 2023-05-21 NOTE — Progress Notes (Addendum)
 Tami Schmidt   DOB:[birth year]   FM#:[id number]      ASSESSMENT & PLAN:  1.  Lower GI bleed - Patient reports passing large blood clots from rectum earlier in the afternoon yesterday 05/20/2023.  Patient also complaining of feeling lightheaded and feeling like she was about to pass out prior to noting the rectal bleed. - CT angio done today shows no active GI bleed. - PET scan was done on 05/01/2023 and showed sigmoid colon diverticulosis, likely from local diverticulitis. - Recommend GI follow-up  2.  Acute anemia - Likely due to acute GI bleed - Workup was done in the ED including CBC with differential which showed significant drop in hemoglobin from 12.4 on 04/30/23 to 8.5 on 05/21/2023. - Recommend PRBC transfusion for <hemoglobin less than 7.5. - Continue to monitor CBC with differential closely.  3.  HER2 positive left breast cancer - Initial diagnosis 06/16/2013 - She did have right lumpectomy in 1993 for which she received radiation and tamoxifen  for 5 years. - Left breast cancer recurrence in 2002.  Status post left and right mastectomy.  Status post trastuzumab  q. 28 days - Has been followed by medical oncology/Dr. Loretha.  Last seen in office visit on 04/30/2023. - PET was ordered by oncology, subsequently done on 05/01/2023.  Hypermetabolic right hilar lymph node suspicious for metastatic disease.  Left axillary lymph nodes, lytic lesions left second rib suspicious for metastatic disease.  - Dr. Loretha will continue to follow closely    Code Status Full    Subjective:  Patient seen awake and alert laying supine on bed in the ED.  Currently receiving PRBC transfusion and tolerating well.  Patient reports she became lightheaded and was trying to get to the bathroom, subsequently she noted large clots and bleeding from her rectum.  Denies chest pain, nausea, vomiting or other acute symptoms.  No acute distress is noted at this time.  Objective:  Vitals:   05/21/23 1100 05/21/23 1110   BP: 116/62 112/61  Pulse: 74 73  Resp: (!) 22 (!) 21  Temp:  98.3 F (36.8 C)  SpO2: 99%      Intake/Output Summary (Last 24 hours) at 05/21/2023 1155 Last data filed at 05/21/2023 9175 Gross per 24 hour  Intake 315 ml  Output --  Net 315 ml     REVIEW OF SYSTEMS:   Constitutional: +Lightheaded Eyes: Denies blurriness of vision, double vision or watery eyes Ears, nose, mouth, throat, and face: Denies mucositis or sore throat Respiratory: Denies cough, dyspnea or wheezes Cardiovascular: Denies palpitation, chest discomfort or lower extremity swelling Gastrointestinal: +Rectal bleeding Skin: Denies abnormal skin rashes Lymphatics: Denies new lymphadenopathy or easy bruising Neurological: Denies numbness, tingling or new weaknesses Behavioral/Psych: Mood is stable, no new changes  All other systems were reviewed with the patient and are negative.  PHYSICAL EXAMINATION: ECOG PERFORMANCE STATUS: 2 - Symptomatic, <50% confined to bed  Vitals:   05/21/23 1100 05/21/23 1110  BP: 116/62 112/61  Pulse: 74 73  Resp: (!) 22 (!) 21  Temp:  98.3 F (36.8 C)  SpO2: 99%    Filed Weights   05/21/23 0237  Weight: 169 lb (76.7 kg)    GENERAL: alert, no distress and comfortable SKIN: skin color, texture, turgor are normal, no rashes or significant lesions EYES: normal, conjunctiva are pink and non-injected, sclera clear OROPHARYNX: no exudate, no erythema and lips, buccal mucosa, and tongue normal  NECK: supple, thyroid  normal size, non-tender, without nodularity LYMPH: no palpable lymphadenopathy  in the cervical, axillary or inguinal LUNGS: clear to auscultation and percussion with normal breathing effort HEART: regular rate & rhythm and no murmurs and no lower extremity edema ABDOMEN: abdomen soft, non-tender and normal bowel sounds MUSCULOSKELETAL: no cyanosis of digits and no clubbing  PSYCH: alert & oriented x 3 with fluent speech NEURO: no focal motor/sensory  deficits   All questions were answered. The patient knows to call the clinic with any problems, questions or concerns.   The total time spent in the appointment was 55 minutes encounter with patient including review of chart and various tests results, discussions about plan of care and coordination of care plan  Olam JINNY Brunner, NP 05/21/2023 11:55 AM    Labs Reviewed:  Lab Results  Component Value Date   WBC 6.0 05/21/2023   HGB 7.6 (L) 05/21/2023   HCT 24.2 (L) 05/21/2023   MCV 79.3 (L) 05/21/2023   PLT 167 05/21/2023   Recent Labs    04/09/23 0816 04/30/23 1047 05/21/23 0054 05/21/23 0530  NA 139 139 132* 139  K 3.7 3.8 3.1* 3.0*  CL 105 102 101 109  CO2 28 28 19* 21*  GLUCOSE 105* 220* 223* 126*  BUN 19 16 21 19   CREATININE 0.61 0.66 0.67 0.52  CALCIUM 9.6 9.8 8.3* 7.7*  GFRNONAA >60 >60 >60 >60  PROT 7.5 7.6 6.0*  --   ALBUMIN 3.9 3.9 2.9*  --   AST 25 29 28   --   ALT 14 18 17   --   ALKPHOS 76 85 65  --   BILITOT 0.5 0.5 0.5  --     Studies Reviewed:  CT ANGIO ABDOMEN PELVIS  W &/OR WO CONTRAST Result Date: 05/21/2023 CLINICAL DATA:  Lower GI bleed with bright red blood. History of breast cancer with bilateral mastectomy and XRT. EXAM: CTA ABDOMEN AND PELVIS WITHOUT AND WITH CONTRAST TECHNIQUE: Multidetector CT imaging of the abdomen and pelvis was performed using the standard protocol during bolus administration of intravenous contrast. Multiplanar reconstructed images and MIPs were obtained and reviewed to evaluate the vascular anatomy. RADIATION DOSE REDUCTION: This exam was performed according to the departmental dose-optimization program which includes automated exposure control, adjustment of the mA and/or kV according to patient size and/or use of iterative reconstruction technique. CONTRAST:  OMNIPAQUE  IOHEXOL  350 MG/ML SOLN COMPARISON:  CTA GI bleed scan 10/04/2022, and chest, abdomen and pelvis CT with contrast 12/18/2022. FINDINGS: VASCULAR Aorta: Normal  caliber aorta without aneurysm, dissection, vasculitis or significant stenosis. There are moderate patchy nonstenosing calcific plaques. 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 There are nonstenosing ostial calcific plaques also. There is no flow-significant stenosis, occlusion or dissection. There are patchy moderate branch calcific plaques of the splenic artery. SMA: Patent without evidence of aneurysm, dissection, vasculitis or significant stenosis. No significant plaque disease. Renals: Again there is moderate mixed plaque in proximal 1.4 cm of the right renal artery, narrowing this segment of the vessel up to 50%. The rest of the right renal artery is widely patent without branch occlusion. On the left, ostial calcific plaques again narrow the vessel origin by 70%. The rest of the vessel widely patent without branch occlusions. Both renal arteries are single. IMA: Patent without evidence of aneurysm, dissection, vasculitis or significant stenosis. Inflow: There are moderate patchy calcific plaques in the inflow arteries. The bilateral common iliac and external iliac arteries show no flow-limiting stenosis. Both internal  iliac arteries are more heavily calcified and more moderately stenotic Proximal Outflow: There are moderate calcific plaques in the common femoral and superficial femoral arteries. No flow-limiting stenosis is seen the visualized portions. Visualized deep femoral arteries are clear. Veins: The bilateral external iliac veins very small in caliber. Remainder of the regional venous structures opacify well. There is no portal vein dilatation. This was seen previously. Review of the MIP images confirms the above findings. NON-VASCULAR Lower chest: Mild cardiomegaly. There calcifications in the mitral ring and coronary arteries. No pericardial effusion. Scattered scarring both lung bases. Small hiatal hernia. Hepatobiliary: Cholelithiasis  without evidence of acute cholecystitis. No hepatic mass enhancement. No biliary dilatation. Pancreas: Occasional parenchymal calcifications in the proximal gland consistent with chronic calcific pancreatitis. No evidence of acute pancreatitis or mass. Spleen: No abnormality. Adrenals/Urinary Tract: Mild adrenal hyperplasia is again seen. No mass. No renal mass enhancement, stones or hydronephrosis. Bilateral small stable Bosniak 1 and Bosniak 2 type renal cysts are unaltered. No imaging follow-up recommended. Unremarkable bladder. Stomach/Bowel: Small hiatal hernia. No GI tract dilatation or overt wall thickening. An appendix is not seen. There is moderate fecal stasis. Advanced sigmoid diverticulosis. No wall thickening or inflammatory change. No active GI bleed is seen. Lymphatic: No adenopathy. Reproductive: Status post hysterectomy. No adnexal masses. Other: Large low midline bowel containing ventral hernia. No upstream bowel obstruction at this time. There is no free fluid, free hemorrhage or free air. Multiple pelvic phleboliths. Musculoskeletal: Right hip arthroplasty. Osteopenia and degenerative change of the spine. Extensive lower thoracic spine bridging enthesopathy and advanced lumbar degenerative disc disease. No acute or other significant osseous findings. Multilevel acquired lumbar spinal stenosis. IMPRESSION: 1. No active GI bleed is seen. 2. Aortic and branch vessel atherosclerosis. 3. 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 4. Stable 50% stenosis of the proximal right renal artery and 70% stenosis of the left renal artery origin. 5. Cardiomegaly with mitral ring and coronary artery calcifications. 6. Constipation and diverticulosis. 7. Large low midline bowel containing ventral hernia without upstream bowel obstruction. 8. Cholelithiasis. 9. Chronic calcific pancreatitis. 10. Small hiatal hernia. 11. Osteopenia and advanced degenerative change. Aortic  Atherosclerosis (ICD10-I70.0). Electronically Signed   By: Francis Quam M.D.   On: 05/21/2023 06:27   NM PET Image Restag (PS) Skull Base To Thigh Result Date: 05/07/2023 CLINICAL DATA:  Subsequent treatment strategy for metastatic breast cancer. EXAM: NUCLEAR MEDICINE PET SKULL BASE TO THIGH TECHNIQUE: 8.1 mCi F-18 FDG was injected intravenously. Full-ring PET imaging was performed from the skull base to thigh after the radiotracer. CT data was obtained and used for attenuation correction and anatomic localization. Fasting blood glucose: 110 mg/dl COMPARISON:  Multiple exams, including CTA chest 01/09/2023 and CT scan from 12/18/2022 FINDINGS: Mediastinal blood pool activity: SUV max 2.1 Liver activity: SUV max NA NECK: No significant abnormal hypermetabolic activity in this region. Incidental CT findings: Bilateral common carotid atheromatous vascular calcification. CHEST: Left axillary lymph nodes observed up to 1.2 cm in short axis on image 64 series 4, maximum SUV 3.3. Intervally calcified left subpectoral lymph node previously 2.5 cm in short axis, currently about 2.0 cm in short axis and with maximum SUV of 3.3. Mild multifocal venous activity along the left upper extremity. Hypermetabolic right hilar lymph node, maximum SUV 6.6. Incidental CT findings: Coronary, aortic arch, and branch vessel atherosclerotic vascular disease. Cardiomegaly. Right Port-A-Cath tip: SVC. ABDOMEN/PELVIS: Sigmoid colon diverticulosis with wall thickening along a 5 cm segment  of the sigmoid colon on image 142 of series 4, faint adjacent stranding, and hypermetabolic activity with maximum SUV of 6.4. This is likely from local diverticulitis, strictly speaking, colon cancer in this vicinity is not excluded. Other presumed physiologic small bowel activity is observed. Incidental CT findings: Cholelithiasis. Atherosclerosis is present, including aortoiliac atherosclerotic disease. Sigmoid colon diverticulosis. Rectus diastasis.  SKELETON: Lytic lesion of the left second rib anteriorly, maximum SUV 4.8, suspicious for metastatic lesion. Activity associated with the severe degenerative glenohumeral arthropathy bilaterally, felt to be benign. Incidental CT findings: Left subacromial subdeltoid bursitis. Severe degenerative glenohumeral arthropathy bilaterally. Right hip prosthesis. Thoracic and lumbar spondylosis. IMPRESSION: 1. Hypermetabolic right hilar lymph node, maximum SUV 6.6, suspicious for metastatic disease. 2. Left axillary lymph nodes are mildly hypermetabolic, maximum SUV 3.3. Intervally calcified left subpectoral lymph node previously 2.5 cm in short axis, currently about 2.0 cm in short axis and with maximum SUV 3.3. 3. Lytic lesion of the left second rib anteriorly, maximum SUV 4.8, suspicious for metastatic lesion. 4. Sigmoid colon diverticulosis with wall thickening along a 5 cm segment of the sigmoid colon, faint adjacent stranding, and hypermetabolic activity with maximum SUV of 6.4. This is likely from local diverticulitis, strictly speaking, colon cancer in this vicinity is not excluded. 5. Cholelithiasis. 6. Left subacromial subdeltoid bursitis. 7. Severe degenerative glenohumeral arthropathy bilaterally. 8. Thoracic and lumbar spondylosis. 9. Aortic atherosclerosis. Aortic Atherosclerosis (ICD10-I70.0). Electronically Signed   By: Ryan Salvage M.D.   On: 05/07/2023 11:53     Attending Note  I personally saw the patient, reviewed the chart and examined the patient. The plan of care was discussed with the patient and the admitting team. I agree with the assessment and plan as documented above. Thank you very much for the consultation. I saw the pt, agree with the above plan. She is very much hoping I can switch her to a pill if possible down the lane. We discussed the PET results in detail, she wanted to see the images. I can consider a low dose of neratinib  and follow her closely if she wants to try this. I  will discuss more in the outpt setting.

## 2023-05-21 NOTE — Plan of Care (Signed)
  Problem: Metabolic: Goal: Ability to maintain appropriate glucose levels will improve Outcome: Progressing   Problem: Skin Integrity: Goal: Risk for impaired skin integrity will decrease Outcome: Progressing   Problem: Clinical Measurements: Goal: Ability to maintain clinical measurements within normal limits will improve Outcome: Progressing   Problem: Activity: Goal: Risk for activity intolerance will decrease Outcome: Progressing

## 2023-05-21 NOTE — Hospital Course (Addendum)
 89 year old female with history of recurrent breast cancer on infusion every 3 weeks-recent pet scan 1/17 right hilar metastatic disease suspected lytic lesion of the left second rib, cholelithiasis, severe degenerative glenohumeral arthropathy thoracolumbar spondylosis, diabetes mellitus type 2, hypertension, lymphedema, chronic constipation presented with bright red blood rectum 05/20/23 around 2 pm.. In the ED initially hypotensive 80/60 hypoxic 50% on room air placed on 8 L simple mask upon arrival appeared cold and clammy. Subsequently blood pressure 90s to 100, doing well on room air. Labs obtained showing hypokalemia 3.1 hyperglycemia 203 bicarb 19 hemoglobin 8.5.  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 Being managed for probable diverticular bleeding.  Patient received 2 units prbc on 2/6 and 1 more unit on 2/7 No recurrence of rectal bleeding since 2/7 AM, diet slowly advanced, tolerating soft diet since 2/8 dinner tome. Hemoglobin remains stable at 8.5 g.  At this time she will be discharged home with outpatient follow-up with her GI

## 2023-05-21 NOTE — ED Notes (Signed)
!  st unit PRBC complete. Admitting MD at [facility]. Pt alert, NAD, calm, interactive, denies sx or complaints. No changes, VSS.

## 2023-05-21 NOTE — Progress Notes (Signed)
 Patient seen and examined personally, I reviewed the chart, history and physical and admission note, done by admitting physician this morning and agree with the same with following addendum.  Please refer to the morning admission note for more detailed plan of care.  Briefly,  89 year old female with history of recurrent breast cancer on infusion every 3 weeks-recent pet scan 1/17 right hilar metastatic disease suspected lytic lesion of the left second rib, cholelithiasis, severe degenerative glenohumeral arthropathy thoracolumbar spondylosis, diabetes mellitus type 2, hypertension, lymphedema, chronic constipation presented with bright red blood rectum 05/20/23 around 2 pm.. In the ED initially hypotensive 80/60 hypoxic 50% on room air placed on 8 L simple mask upon arrival appeared cold and clammy. Subsequently blood pressure 90s to 100, doing well on room air. Labs obtained showing hypokalemia 3.1 hyperglycemia 203 bicarb 19 hemoglobin 8.5.  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   Seen and examined No more bleeding since coming to the hospital Alert awake pleasant   A/P: Lower GI bleeding ABLA on anemia chronic: Likely diverticular bleeding.  Similar episode in hospitalization back in September-hemoglobin down up to 6.7 g needing 2 L PRBC. Hemoglobin on admit  8.5.  Previously 12.4 on 04/30/23, b/l 10-11 past few months, but as low as 6.7 gm on 01/07/23.  CT angiogram pelvis reviewed no active GI bleed noted.  2 units PRBC ordered on admission and transfusing.  [facility] consulted.  Added PPI, avoid  anticoagulants Recent Labs  Lab 05/21/23 0054 05/21/23 0415  HGB 8.5* 7.6*  HCT 27.2* 24.2*    Essential hypertension: BP soft/hypotensive on admission holding Norvasc  and Benicar  T2DM: With uncontrolled hyperglycemia will continue on SSI every 4 hours for now Recent Labs  Lab 05/21/23 0418 05/21/23 0810  GLUCAP 139* 97    Triple positive breast cancer with recurrence: recent pet scan 1/17 right hilar metastatic disease suspected lytic lesion of the left second rib, followed by oncology on infusion every 3 weeks-due today, msg sent to Dr Loretha  Hypokalemia Metabolic acidosis: Replace potassium add gentle IV fluids  History of PE Remote no longer on anticoagulation  Chronic back pain: Stable.

## 2023-05-21 NOTE — ED Provider Notes (Signed)
 [place] [facility]ENCY DEPARTMENT AT [facility] Provider Note   CSN: [id number] Arrival date & time: 05/21/23  0023     History  Chief Complaint  Patient presents with   GI Bleeding    Tami Schmidt is a 89 y.o. female.  89 yo F with a chief complaints of bright red blood per rectum.  This started about 30 minutes prior to EMS picking her up.  Had a large amount of blood per her.  Lots of clots.  She feels a little bit lightheaded and dizzy with it.  With EMS had a heart rate into the 150s blood pressure was low.  Seem to improve en route with a small amount of IV fluids.  Patient denies blood thinner use.  Denies abdominal pain.  Was of her normal state of health yesterday.  History of breast cancer on chemotherapy.        Home Medications Prior to Admission medications   Medication Sig Start Date End Date Taking? Authorizing Provider  acetaminophen  (TYLENOL ) 500 MG tablet Take 1,000 mg by mouth as needed for moderate pain.    [provider]  amLODipine  (NORVASC ) 10 MG tablet TAKE 1 TABLET BY MOUTH DAILY 04/13/23   Iruku, Praveena, MD  amoxicillin  (AMOXIL ) 500 MG capsule Take 2,000 mg by mouth as needed. Take 1 hour prior to Dentist Appt. Patient not taking: Reported on 03/16/2023 11/17/22   [provider]  lidocaine  (LIDODERM ) 5 % Place 1 patch onto the skin daily. Remove & Discard patch within 12 hours or as directed by MD 01/03/23   Kandis Perkins, DO  metFORMIN  (GLUCOPHAGE ) 500 MG tablet Take 500 mg by mouth daily with breakfast.    [provider]  olmesartan-hydrochlorothiazide  (BENICAR HCT) 40-12.5 MG tablet Take 1 tablet by mouth daily.    [provider]  Turmeric (QC TUMERIC COMPLEX PO) Take 1 tablet by mouth daily.    [provider]  VITAMIN D PO Take 1 tablet by mouth daily.    [provider]      Allergies    Bactrim, Lisinopril, Vasotec, and Sulfamethoxazole-trimethoprim    Review of Systems    Review of Systems  Physical Exam Updated Vital Signs BP (!) 101/54   Pulse 81   Temp 98 F (36.7 C) (Oral)   Resp 20   SpO2 98%  Physical Exam Vitals and nursing note reviewed.  Constitutional:      General: She is not in acute distress.    Appearance: She is well-developed. She is not diaphoretic.  HENT:     Head: Normocephalic and atraumatic.  Eyes:     Pupils: Pupils are equal, round, and reactive to light.  Cardiovascular:     Rate and Rhythm: Normal rate and regular rhythm.     Heart sounds: No murmur heard.    No friction rub. No gallop.  Pulmonary:     Effort: Pulmonary effort is normal.     Breath sounds: No wheezing or rales.  Abdominal:     General: There is no distension.     Palpations: Abdomen is soft.     Tenderness: There is no abdominal tenderness.  Musculoskeletal:        General: No tenderness.     Cervical back: Normal range of motion and neck supple.  Skin:    General: Skin is warm and dry.  Neurological:     Mental Status: She is alert and oriented to person, place, and time.  Psychiatric:  Behavior: Behavior normal.     ED Results / Procedures / Treatments   Labs (all labs ordered are listed, but only abnormal results are displayed) Labs Reviewed  CBC WITH DIFFERENTIAL/PLATELET - Abnormal; Notable for the following components:      Result Value   RBC 3.43 (*)    Hemoglobin 8.5 (*)    HCT 27.2 (*)    MCV 79.3 (*)    MCH 24.8 (*)    RDW 16.3 (*)    All other components within normal limits  COMPREHENSIVE METABOLIC PANEL - Abnormal; Notable for the following components:   Sodium 132 (*)    Potassium 3.1 (*)    CO2 19 (*)    Glucose, Bld 223 (*)    Calcium 8.3 (*)    Total Protein 6.0 (*)    Albumin 2.9 (*)    All other components within normal limits  TYPE AND SCREEN    EKG None  Radiology No results found.  Procedures .Critical Care  Performed by: Emil Share, DO Authorized by: Emil Share, DO   Critical care  provider statement:    Critical care time (minutes):  35   Critical care time was exclusive of:  Separately billable procedures and treating other patients   Critical care was time spent personally by me on the following activities:  Development of treatment plan with patient or surrogate, discussions with consultants, evaluation of patient's response to treatment, examination of patient, ordering and review of laboratory studies, ordering and review of radiographic studies, ordering and performing treatments and interventions, pulse oximetry, re-evaluation of patient's condition and review of old charts   Care discussed with: admitting provider       Medications Ordered in ED Medications  sodium chloride  0.9 % bolus 1,000 mL (1,000 mLs Intravenous New Bag/Given 05/21/23 0059)    ED Course/ Medical Decision Making/ A&P                                 Medical Decision Making Amount and/or Complexity of Data Reviewed Labs: ordered.   89 yo F with a chief complaints of bright red blood per rectum.  Started just prior to arrival.  Reportedly was hemodynamically unstable initially now had resolved by the time she is here in the ER.  I wonder if this is due to a vasovagal event.  Will obtain laboratory evaluation.  Type and screen.  Bolus of IV fluids.  Reassess.  Patient's hemoglobin down to 8.5 from around 12 last check.  BUN is normal and metabolic panel.  Mild hyponatremia.  Per hospital protocol secure chat message sent to Dr. Elicia, [facility].  Will discuss with medicine.  The patients results and plan were reviewed and discussed.   Any x-rays performed were independently reviewed by myself.   Differential diagnosis were considered with the presenting HPI.  Medications  sodium chloride  0.9 % bolus 1,000 mL (1,000 mLs Intravenous New Bag/Given 05/21/23 0059)    Vitals:   05/21/23 0100 05/21/23 0130 05/21/23 0145 05/21/23 0200  BP: (!) 88/53 (!) 102/58 (!) 99/57 (!) 101/54   Pulse: (!) 127 88 71 81  Resp: (!) 25 (!) 21 16 20   Temp:      TempSrc:      SpO2: 100% 100% 100% 98%    Final diagnoses:  BRBPR (bright red blood per rectum)    Admission/ observation were discussed with the admitting physician, patient and/or family  and they are comfortable with the plan.          Final Clinical Impression(s) / ED Diagnoses Final diagnoses:  BRBPR (bright red blood per rectum)    Rx / DC Orders ED Discharge Orders     None         Emil Share, DO 05/21/23 0226

## 2023-05-21 NOTE — ED Triage Notes (Addendum)
 Pt arrives w/ [facility]. EMS reports pt had 2 episodes of bright red blood. Pt initial BP was 80/60. O2 60% RA. Pt on 8L simple mask upon arrival. Pt weak & clammy upon their arrival. Fluids given en route. 4mg  zofran  given en route.

## 2023-05-21 NOTE — ED Notes (Signed)
 Provider at [facility]

## 2023-05-21 NOTE — Consult Note (Signed)
 Reason for Consult: Lower GI bleeding Referring Physician: Hospital team  Tami Schmidt is an 89 y.o. female.  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   Diabetes mellitus    fasting 90-100   HTN (hypertension) 03/25/2013   Hx of radiation therapy    breasts hx   Hypercholesterolemia    Hypertension    Lymphedema of arm    left arm   Pulmonary embolism (HCC)    Type II or unspecified type diabetes mellitus without mention of complication, not stated as uncontrolled 03/25/2013    Past Surgical History:  Procedure Laterality Date   ABDOMINAL HYSTERECTOMY     ANKLE SURGERY Right 1995   APPENDECTOMY     BIOPSY  01/07/2018   Procedure: BIOPSY;  Surgeon: Saintclair Jasper, MD;  Location: [facility];  Service: Gastroenterology;;   BREAST SURGERY Bilateral    mastectomy   COLONOSCOPY WITH PROPOFOL  N/A 01/07/2018   Procedure: COLONOSCOPY WITH PROPOFOL ;  Surgeon: Saintclair Jasper, MD;  Location: [facility];  Service: Gastroenterology;  Laterality: N/A;   ESOPHAGOGASTRODUODENOSCOPY (EGD) WITH PROPOFOL  N/A 01/06/2018   Procedure: ESOPHAGOGASTRODUODENOSCOPY (EGD) WITH PROPOFOL ;  Surgeon: Saintclair Jasper, MD;  Location: [facility];  Service: Gastroenterology;  Laterality: N/A;   HERNIA REPAIR  04-26-2010   IR IMAGING GUIDED PORT INSERTION  10/29/2021   MASS EXCISION Left 01/26/2013   Procedure: EXCISION LEFT CHEST WALL  MASS AND LEFT ABDOMNAL WALL MASS;  Surgeon: Vicenta DELENA Poli, MD;  Location: [facility] OR;  Service: General;  Laterality: Left;   MASS EXCISION Left 09/20/2014   Procedure: EXCISION OF LEFT CHEST WALL MASS;  Surgeon: Vicenta Poli, MD;  Location: [facility];  Service: General;  Laterality: Left;   TEE WITHOUT CARDIOVERSION N/A 12/08/2017   Procedure: TRANSESOPHAGEAL ECHOCARDIOGRAM (TEE);  Surgeon: Pietro Redell RAMAN, MD;  Location: [facility];  Service: Cardiovascular;  Laterality: N/A;   TOTAL HIP ARTHROPLASTY Right 01/05/2017   TOTAL HIP ARTHROPLASTY Right 01/05/2017   Procedure: TOTAL HIP ARTHROPLASTY ANTERIOR APPROACH;  Surgeon: Liam Lerner, MD;  Location: [facility] OR;  Service: Orthopedics;  Laterality: Right;    Family History  Problem Relation Age of Onset   Breast cancer Cousin        maternal first cousin   Uterine cancer Cousin        maternal first cousin    Social History:  reports that she quit smoking about 51 years ago. Her smoking use included cigarettes. She has never used smokeless tobacco. She reports that she does not currently use alcohol. She reports that she does not currently use drugs.  Allergies:  Allergies  Allergen Reactions   Bactrim Swelling    SWELLING OF MOUTH/FACE.   Lisinopril Swelling    SWELLING OF MOUTH/FACE.   Vasotec Swelling    SWELLING  OF MOUTH/FACE.   Sulfamethoxazole-Trimethoprim     Other reaction(s): Unknown    Medications: I have reviewed the patient's current medications.  Results for orders placed or performed during the hospital encounter of 05/21/23 (from the past 48 hours)  Type and screen     Status: None (Preliminary result)   Collection Time: 05/21/23 12:54 AM  Result Value Ref Range   ABO/RH(D) B POS    Antibody Screen NEG    Sample Expiration 05/24/2023,2359    Unit Number [id number]    Blood Component Type RBC LR PHER1    Unit division 00    Status of Unit ISSUED    Transfusion Status OK TO TRANSFUSE     Crossmatch Result      Compatible Performed at [facility], [address], [place], [place] [address]    Unit Number [id number]    Blood Component Type RED CELLS,LR    Unit division 00    Status of Unit ISSUED    Transfusion Status OK TO TRANSFUSE    Crossmatch Result Compatible   CBC with Differential     Status: Abnormal   Collection Time: 05/21/23 12:54 AM  Result Value Ref Range   WBC 6.0 4.0 - 10.5 K/uL   RBC 3.43 (L) 3.87 - 5.11 MIL/uL   Hemoglobin 8.5 (L) 12.0 - 15.0 g/dL   HCT 72.7 (L) 63.9 - 53.9 %   MCV 79.3 (L) 80.0 - 100.0 fL   MCH 24.8 (L) 26.0 - 34.0 pg   MCHC 31.3 30.0 - 36.0 g/dL   RDW 83.6 (H) 88.4 - 84.4 %   Platelets 167 150 - 400 K/uL   nRBC 0.0 0.0 - 0.2 %   Neutrophils Relative % 73 %   Neutro Abs 4.4 1.7 - 7.7 K/uL   Lymphocytes Relative 18 %   Lymphs Abs 1.1 0.7 - 4.0 K/uL   Monocytes Relative 6 %   Monocytes Absolute 0.4 0.1 - 1.0 K/uL   Eosinophils Relative 1 %   Eosinophils Absolute 0.1 0.0 - 0.5 K/uL   Basophils Relative 1 %   Basophils Absolute 0.0 0.0 - 0.1 K/uL   Immature Granulocytes 1 %   Abs Immature Granulocytes 0.04 0.00 - 0.07 K/uL    Comment: Performed at [facility], [address], [place], [place] [address]  Comprehensive metabolic panel     Status: Abnormal   Collection Time: 05/21/23 12:54 AM  Result Value Ref Range   Sodium 132 (L) 135 - 145 mmol/L   Potassium 3.1 (L) 3.5 - 5.1 mmol/L   Chloride 101 98 - 111 mmol/L   CO2 19 (L) 22 - 32 mmol/L   Glucose, Bld 223 (H) 70 - 99 mg/dL    Comment: Glucose reference range applies only to samples taken after fasting for at least 8 hours.   BUN 21 8 - 23 mg/dL   Creatinine, Ser 9.32 0.44 - 1.00 mg/dL   Calcium 8.3 (L) 8.9 - 10.3 mg/dL   Total Protein 6.0 (L) 6.5 - 8.1 g/dL   Albumin 2.9 (L) 3.5 - 5.0 g/dL   AST 28 15 - 41 U/L   ALT 17 0 - 44 U/L   Alkaline Phosphatase 65 38 - 126 U/L   Total Bilirubin 0.5 0.0 - 1.2 mg/dL   GFR,  Estimated >39 >39 mL/min    Comment: (NOTE) Calculated using the CKD-EPI Creatinine Equation (2021)    Anion gap 12 5 - 15    Comment:  Performed at [facility], [address], [place], [place] [address]  Hemoglobin and hematocrit, blood     Status: Abnormal   Collection Time: 05/21/23  4:15 AM  Result Value Ref Range   Hemoglobin 7.6 (L) 12.0 - 15.0 g/dL   HCT 75.7 (L) 63.9 - 53.9 %    Comment: Performed at [facility], [address], [place], [place] [address]  CBG monitoring, ED     Status: Abnormal   Collection Time: 05/21/23  4:18 AM  Result Value Ref Range   Glucose-Capillary 139 (H) 70 - 99 mg/dL    Comment: Glucose reference range applies only to samples taken after fasting for at least 8 hours.  Prepare RBC (crossmatch)     Status: None   Collection Time: 05/21/23  5:00 AM  Result Value Ref Range   Order Confirmation      ORDER PROCESSED BY BLOOD BANK Performed at [facility], [address] [place], [place] [address]   Basic metabolic panel     Status: Abnormal   Collection Time: 05/21/23  5:30 AM  Result Value Ref Range   Sodium 139 135 - 145 mmol/L    Comment: DELTA CHECK NOTED   Potassium 3.0 (L) 3.5 - 5.1 mmol/L   Chloride 109 98 - 111 mmol/L   CO2 21 (L) 22 - 32 mmol/L   Glucose, Bld 126 (H) 70 - 99 mg/dL    Comment: Glucose reference range applies only to samples taken after fasting for at least 8 hours.   BUN 19 8 - 23 mg/dL   Creatinine, Ser 9.47 0.44 - 1.00 mg/dL   Calcium 7.7 (L) 8.9 - 10.3 mg/dL   GFR, Estimated >39 >39 mL/min    Comment: (NOTE) Calculated using the CKD-EPI Creatinine Equation (2021)    Anion gap 9 5 - 15    Comment: Performed at [facility], [address] [place], [place] [address]  Protime-INR     Status: Abnormal   Collection Time: 05/21/23  5:30 AM  Result Value Ref Range   Prothrombin  Time 16.2 (H) 11.4 - 15.2 seconds   INR 1.3 (H) 0.8 - 1.2     Comment: (NOTE) INR goal varies based on device and disease states. Performed at [facility], [address], [place], [place] [address]   CBG monitoring, ED     Status: None   Collection Time: 05/21/23  8:10 AM  Result Value Ref Range   Glucose-Capillary 97 70 - 99 mg/dL    Comment: Glucose reference range applies only to samples taken after fasting for at least 8 hours.  CBG monitoring, ED     Status: None   Collection Time: 05/21/23 11:53 AM  Result Value Ref Range   Glucose-Capillary 87 70 - 99 mg/dL    Comment: Glucose reference range applies only to samples taken after fasting for at least 8 hours.    CT ANGIO ABDOMEN PELVIS  W &/OR WO CONTRAST Result Date: 05/21/2023 CLINICAL DATA:  Lower GI bleed with bright red blood. History of breast cancer with bilateral mastectomy and XRT. EXAM: CTA ABDOMEN AND PELVIS WITHOUT AND WITH CONTRAST TECHNIQUE: Multidetector CT imaging of the abdomen and pelvis was performed using the standard protocol during bolus administration of intravenous contrast. Multiplanar reconstructed images and MIPs were obtained and reviewed to evaluate the vascular anatomy. RADIATION DOSE REDUCTION: This exam was performed according to the departmental dose-optimization program which includes automated exposure  control, adjustment of the mA and/or kV according to patient size and/or use of iterative reconstruction technique. CONTRAST:  OMNIPAQUE  IOHEXOL  350 MG/ML SOLN COMPARISON:  CTA GI bleed scan 10/04/2022, and chest, abdomen and pelvis CT with contrast 12/18/2022. FINDINGS: VASCULAR Aorta: Normal caliber aorta without aneurysm, dissection, vasculitis or significant stenosis. There are moderate patchy nonstenosing calcific plaques. 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 There are nonstenosing ostial calcific plaques also. There is no flow-significant stenosis, occlusion or dissection.  There are patchy moderate branch calcific plaques of the splenic artery. SMA: Patent without evidence of aneurysm, dissection, vasculitis or significant stenosis. No significant plaque disease. Renals: Again there is moderate mixed plaque in proximal 1.4 cm of the right renal artery, narrowing this segment of the vessel up to 50%. The rest of the right renal artery is widely patent without branch occlusion. On the left, ostial calcific plaques again narrow the vessel origin by 70%. The rest of the vessel widely patent without branch occlusions. Both renal arteries are single. IMA: Patent without evidence of aneurysm, dissection, vasculitis or significant stenosis. Inflow: There are moderate patchy calcific plaques in the inflow arteries. The bilateral common iliac and external iliac arteries show no flow-limiting stenosis. Both internal iliac arteries are more heavily calcified and more moderately stenotic Proximal Outflow: There are moderate calcific plaques in the common femoral and superficial femoral arteries. No flow-limiting stenosis is seen the visualized portions. Visualized deep femoral arteries are clear. Veins: The bilateral external iliac veins very small in caliber. Remainder of the regional venous structures opacify well. There is no portal vein dilatation. This was seen previously. Review of the MIP images confirms the above findings. NON-VASCULAR Lower chest: Mild cardiomegaly. There calcifications in the mitral ring and coronary arteries. No pericardial effusion. Scattered scarring both lung bases. Small hiatal hernia. Hepatobiliary: Cholelithiasis without evidence of acute cholecystitis. No hepatic mass enhancement. No biliary dilatation. Pancreas: Occasional parenchymal calcifications in the proximal gland consistent with chronic calcific pancreatitis. No evidence of acute pancreatitis or mass. Spleen: No abnormality. Adrenals/Urinary Tract: Mild adrenal hyperplasia is again seen. No mass. No  renal mass enhancement, stones or hydronephrosis. Bilateral small stable Bosniak 1 and Bosniak 2 type renal cysts are unaltered. No imaging follow-up recommended. Unremarkable bladder. Stomach/Bowel: Small hiatal hernia. No GI tract dilatation or overt wall thickening. An appendix is not seen. There is moderate fecal stasis. Advanced sigmoid diverticulosis. No wall thickening or inflammatory change. No active GI bleed is seen. Lymphatic: No adenopathy. Reproductive: Status post hysterectomy. No adnexal masses. Other: Large low midline bowel containing ventral hernia. No upstream bowel obstruction at this time. There is no free fluid, free hemorrhage or free air. Multiple pelvic phleboliths. Musculoskeletal: Right hip arthroplasty. Osteopenia and degenerative change of the spine. Extensive lower thoracic spine bridging enthesopathy and advanced lumbar degenerative disc disease. No acute or other significant osseous findings. Multilevel acquired lumbar spinal stenosis. IMPRESSION: 1. No active GI bleed is seen. 2. Aortic and branch vessel atherosclerosis. 3. 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 4. Stable 50% stenosis of the proximal right renal artery and 70% stenosis of the left renal artery origin. 5. Cardiomegaly with mitral ring and coronary artery calcifications. 6. Constipation and diverticulosis. 7. Large low midline bowel containing ventral hernia without upstream bowel obstruction. 8. Cholelithiasis. 9. Chronic calcific pancreatitis. 10. Small hiatal hernia. 11. Osteopenia and advanced degenerative change. Aortic  Atherosclerosis (ICD10-I70.0). Electronically Signed   By: Francis Quam M.D.   On: 05/21/2023 06:27    Review of Systems negative except above Blood pressure 127/68, pulse 75, temperature 98 F (36.7 C), temperature source Oral, resp. rate (!) 22, height 5' 7 (1.702 m), weight 76.7 kg, SpO2 100%. Physical Exam vital signs stable afebrile no acute  distress abdomen is soft nontender CTA okay without signs of bleeding multiple other chronic findings as above including diverticuli most likely cause BUN and creatinine okay LFTs okay hemoglobin dropped from 12.4-7.6 white count and platelet count okay she was not iron deficient in September and October of last year  Assessment/Plan: Probable diverticular bleeding Plan: She might need to be transfused based on her age and will allow clear liquids and if no signs of bleeding tomorrow hopefully can advance diet and go home soon and I will check on her tomorrow but please call me sooner if GI question or problem  Xavion Muscat E 05/21/2023, 1:52 PM

## 2023-05-21 NOTE — ED Notes (Signed)
 Pt alert, NAD, calm, interactive, resps e/u, speaking in clear complete sentences, VSS. Denies sx, complaints, questions or needs. Denies pain, nausea, sob, dizziness or other sx. Family at [facility]. Updated. Pending inpt bed. PRBC rate increased.

## 2023-05-21 NOTE — H&P (Signed)
 PCP:   Pcp, No   Chief Complaint:  GI bleed  HPI: This is a 89 year old female with past medical history of recurrent breast cancer on infusion every 3 weeks., T2DM, HTN, lymphedema, chronic constipation.  Around 2 PM she started passing lots of blood clots rectally.  Per patient BRBPR.  Her last colonoscopy was 6 to 7 years ago.  It was a routine colonoscopy, nothing found.  Per patient this is her third episode in recent years, the last was approximately a year and a half ago.  She denies chest pains or abdominal pains.  She states she was lightheaded briefly prior to coming to the ER.  She has no other complaints.  Patient presenting blood pressure 167/76, HR 137.  Most recent vital 99/57, heart rate 71.  Patient's hemoglobin 8.5 (baseline 12.4 done 04/30/2023).  Glucose 223.  Potassium 3.1.  Results for orders placed or performed during the hospital encounter of 05/21/23  CBC with Differential   Collection Time: 05/21/23 12:54 AM  Result Value Ref Range   WBC 6.0 4.0 - 10.5 K/uL   RBC 3.43 (L) 3.87 - 5.11 MIL/uL   Hemoglobin 8.5 (L) 12.0 - 15.0 g/dL   HCT 72.7 (L) 63.9 - 53.9 %   MCV 79.3 (L) 80.0 - 100.0 fL   MCH 24.8 (L) 26.0 - 34.0 pg   MCHC 31.3 30.0 - 36.0 g/dL   RDW 83.6 (H) 88.4 - 84.4 %   Platelets 167 150 - 400 K/uL   nRBC 0.0 0.0 - 0.2 %   Neutrophils Relative % 73 %   Neutro Abs 4.4 1.7 - 7.7 K/uL   Lymphocytes Relative 18 %   Lymphs Abs 1.1 0.7 - 4.0 K/uL   Monocytes Relative 6 %   Monocytes Absolute 0.4 0.1 - 1.0 K/uL   Eosinophils Relative 1 %   Eosinophils Absolute 0.1 0.0 - 0.5 K/uL   Basophils Relative 1 %   Basophils Absolute 0.0 0.0 - 0.1 K/uL   Immature Granulocytes 1 %   Abs Immature Granulocytes 0.04 0.00 - 0.07 K/uL  Comprehensive metabolic panel   Collection Time: 05/21/23 12:54 AM  Result Value Ref Range   Sodium 132 (L) 135 - 145 mmol/L   Potassium 3.1 (L) 3.5 - 5.1 mmol/L   Chloride 101 98 - 111 mmol/L   CO2 19 (L) 22 - 32 mmol/L   Glucose,  Bld 223 (H) 70 - 99 mg/dL   BUN 21 8 - 23 mg/dL   Creatinine, Ser 9.32 0.44 - 1.00 mg/dL   Calcium 8.3 (L) 8.9 - 10.3 mg/dL   Total Protein 6.0 (L) 6.5 - 8.1 g/dL   Albumin 2.9 (L) 3.5 - 5.0 g/dL   AST 28 15 - 41 U/L   ALT 17 0 - 44 U/L   Alkaline Phosphatase 65 38 - 126 U/L   Total Bilirubin 0.5 0.0 - 1.2 mg/dL   GFR, Estimated >39 >39 mL/min   Anion gap 12 5 - 15  Type and screen   Collection Time: 05/21/23 12:54 AM  Result Value Ref Range   ABO/RH(D) B POS    Antibody Screen NEG    Sample Expiration      05/24/2023,2359 Performed at [facility], 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   Diabetes mellitus    fasting 90-100   HTN (hypertension) 03/25/2013   Hx of radiation therapy    breasts hx   Hypercholesterolemia    Hypertension    Lymphedema of arm    left arm   Pulmonary embolism (HCC)    Type II or unspecified type diabetes mellitus without mention of complication, not stated as uncontrolled 03/25/2013   Past Surgical History:  Procedure Laterality Date   ABDOMINAL HYSTERECTOMY     ANKLE SURGERY Right 1995   APPENDECTOMY     BIOPSY  01/07/2018   Procedure: BIOPSY;  Surgeon: Saintclair Jasper, MD;  Location: [facility];  Service: Gastroenterology;;   BREAST SURGERY Bilateral    mastectomy   COLONOSCOPY WITH PROPOFOL  N/A 01/07/2018   Procedure: COLONOSCOPY WITH PROPOFOL ;  Surgeon: Saintclair Jasper, MD;  Location: [facility];  Service: Gastroenterology;  Laterality: N/A;   ESOPHAGOGASTRODUODENOSCOPY (EGD) WITH PROPOFOL  N/A 01/06/2018   Procedure: ESOPHAGOGASTRODUODENOSCOPY (EGD) WITH PROPOFOL ;  Surgeon: Saintclair Jasper, MD;  Location: [facility];  Service: Gastroenterology;  Laterality: N/A;   HERNIA REPAIR   04-26-2010   IR IMAGING GUIDED PORT INSERTION  10/29/2021   MASS EXCISION Left 01/26/2013   Procedure: EXCISION LEFT CHEST WALL MASS AND LEFT ABDOMNAL WALL MASS;  Surgeon: Vicenta DELENA Poli, MD;  Location: [facility];  Service: General;  Laterality: Left;   MASS EXCISION Left 09/20/2014   Procedure: EXCISION OF LEFT CHEST WALL MASS;  Surgeon: Vicenta Poli, MD;  Location: [facility];  Service: General;  Laterality: Left;   TEE WITHOUT CARDIOVERSION N/A 12/08/2017   Procedure: TRANSESOPHAGEAL ECHOCARDIOGRAM (TEE);  Surgeon: Pietro Redell RAMAN, MD;  Location: [facility];  Service: Cardiovascular;  Laterality: N/A;   TOTAL HIP ARTHROPLASTY Right 01/05/2017   TOTAL HIP ARTHROPLASTY Right 01/05/2017   Procedure: TOTAL HIP ARTHROPLASTY ANTERIOR APPROACH;  Surgeon: Liam Lerner, MD;  Location: [facility];  Service: Orthopedics;  Laterality: Right;    Medications: Prior to Admission medications   Medication Sig Start Date End Date Taking? Authorizing Provider  acetaminophen  (TYLENOL ) 500 MG tablet Take 1,000 mg by mouth as needed for moderate pain.    [provider]  amLODipine  (NORVASC ) 10 MG tablet TAKE 1 TABLET BY MOUTH DAILY 04/13/23   Iruku, Praveena, MD  amoxicillin  (AMOXIL ) 500 MG capsule Take 2,000 mg by mouth as needed. Take 1 hour prior to Dentist Appt. Patient not taking: Reported on 03/16/2023 11/17/22   [provider]  lidocaine  (LIDODERM ) 5 % Place 1 patch onto the skin daily. Remove & Discard patch within 12 hours or as directed by MD 01/03/23   Kandis Perkins, DO  metFORMIN  (GLUCOPHAGE ) 500 MG tablet Take 500 mg by mouth daily with breakfast.    [provider]  olmesartan-hydrochlorothiazide  (BENICAR HCT) 40-12.5 MG tablet Take 1 tablet by mouth daily.    [provider]  Turmeric (QC TUMERIC COMPLEX PO) Take 1 tablet by mouth daily.    [provider]  VITAMIN D PO Take 1 tablet by mouth daily.    [provider]     Allergies:   Allergies  Allergen Reactions   Bactrim Swelling    SWELLING OF MOUTH/FACE.   Lisinopril Swelling    SWELLING OF MOUTH/FACE.   Vasotec Swelling    SWELLING OF MOUTH/FACE.   Sulfamethoxazole-Trimethoprim     Other reaction(s): Unknown    Social History:  reports that she quit  smoking about 51 years ago. Her smoking use included cigarettes. She has never used smokeless tobacco. She reports that she does not currently use alcohol. She reports that she does not currently use drugs.  Family History: Family History  Problem Relation Age of Onset   Breast cancer Cousin        maternal first cousin   Uterine cancer Cousin        maternal first cousin    Physical Exam: Vitals:   05/21/23 0130 05/21/23 0145 05/21/23 0200 05/21/23 0237  BP: (!) 102/58 (!) 99/57 (!) 101/54   Pulse: 88 71 81   Resp: (!) 21 16 20    Temp:      TempSrc:      SpO2: 100% 100% 98%   Weight:    76.7 kg  Height:    5' 7 (1.702 m)    General:  A&Ox3, weak appearing female, no acute distress Eyes: Pink conjunctiva, no scleral icterus ENT: Moist oral mucosa, neck supple, no thyromegaly Lungs: CTA B/L, no wheeze, no crackles, no use of accessory muscles Cardiovascular: RRR, no regurgitation, no gallops, no murmurs. No JVD Abdomen: soft, +BS, large but not distended or tender, not an acute abdomen GU: not examined Neuro: CN II - XII grossly intact, sensation intact Musculoskeletal: strength 5/5 all extremities, no edema Skin: no rash, no subcutaneous crepitation, no decubitus Psych: appropriate patient  Labs on Admission:  Recent Labs    05/21/23 0054  NA 132*  K 3.1*  CL 101  CO2 19*  GLUCOSE 223*  BUN 21  CREATININE 0.67  CALCIUM 8.3*   Recent Labs    05/21/23 0054  AST 28  ALT 17  ALKPHOS 65  BILITOT 0.5  PROT 6.0*  ALBUMIN 2.9*    Recent Labs    05/21/23 0054  WBC 6.0  NEUTROABS 4.4  HGB 8.5*  HCT 27.2*  MCV 79.3*  PLT 167    Radiological Exams on  Admission: No results found.  Assessment/Plan Present on Admission:  Lower GI bleed -Serial H&H's every 6 hours -Will transfuse 2 units packed red blood cells, patient BP borderline and she has a significant blood loss -[facility] consulted by EDP.  Will see patient in a.m. -BP meds on hold -CT abdomen and pelvis ordered.   Essential hypertension -Norvasc  and Benicar on hold   T2DM -Sliding scale insulin  every 4 hours ordered   Chest wall recurrence of breast cancer [facility]) -Per oncology  Dasean Brow 05/21/2023, 2:47 AM

## 2023-05-22 ENCOUNTER — Other Ambulatory Visit: Payer: Self-pay

## 2023-05-22 DIAGNOSIS — K5791 Diverticulosis of intestine, part unspecified, without perforation or abscess with bleeding: Secondary | ICD-10-CM | POA: Diagnosis not present

## 2023-05-22 LAB — GLUCOSE, CAPILLARY
Glucose-Capillary: 106 mg/dL — ABNORMAL HIGH (ref 70–99)
Glucose-Capillary: 107 mg/dL — ABNORMAL HIGH (ref 70–99)
Glucose-Capillary: 171 mg/dL — ABNORMAL HIGH (ref 70–99)
Glucose-Capillary: 248 mg/dL — ABNORMAL HIGH (ref 70–99)
Glucose-Capillary: 61 mg/dL — ABNORMAL LOW (ref 70–99)
Glucose-Capillary: 82 mg/dL (ref 70–99)
Glucose-Capillary: 95 mg/dL (ref 70–99)
Glucose-Capillary: 96 mg/dL (ref 70–99)

## 2023-05-22 LAB — CBC
HCT: 27.8 % — ABNORMAL LOW (ref 36.0–46.0)
Hemoglobin: 9 g/dL — ABNORMAL LOW (ref 12.0–15.0)
MCH: 26.5 pg (ref 26.0–34.0)
MCHC: 32.4 g/dL (ref 30.0–36.0)
MCV: 81.8 fL (ref 80.0–100.0)
Platelets: 144 10*3/uL — ABNORMAL LOW (ref 150–400)
RBC: 3.4 MIL/uL — ABNORMAL LOW (ref 3.87–5.11)
RDW: 15.9 % — ABNORMAL HIGH (ref 11.5–15.5)
WBC: 5.5 10*3/uL (ref 4.0–10.5)
nRBC: 0 % (ref 0.0–0.2)

## 2023-05-22 LAB — BASIC METABOLIC PANEL
Anion gap: 7 (ref 5–15)
BUN: 14 mg/dL (ref 8–23)
CO2: 23 mmol/L (ref 22–32)
Calcium: 8.6 mg/dL — ABNORMAL LOW (ref 8.9–10.3)
Chloride: 108 mmol/L (ref 98–111)
Creatinine, Ser: 0.5 mg/dL (ref 0.44–1.00)
GFR, Estimated: >60 mL/min (ref >60)
Glucose, Bld: 104 mg/dL — ABNORMAL HIGH (ref 70–99)
Potassium: 3.5 mmol/L (ref 3.5–5.1)
Sodium: 138 mmol/L (ref 135–145)

## 2023-05-22 LAB — HEMOGLOBIN AND HEMATOCRIT, BLOOD
HCT: 24.3 % — ABNORMAL LOW (ref 36.0–46.0)
HCT: 30 % — ABNORMAL LOW (ref 36.0–46.0)
HCT: 26.4 % — ABNORMAL LOW (ref 36.0–46.0)
Hemoglobin: 7.8 g/dL — ABNORMAL LOW (ref 12.0–15.0)
Hemoglobin: 9.5 g/dL — ABNORMAL LOW (ref 12.0–15.0)
Hemoglobin: 8.4 g/dL — ABNORMAL LOW (ref 12.0–15.0)

## 2023-05-22 LAB — PREPARE RBC (CROSSMATCH)

## 2023-05-22 MED ORDER — ACETAMINOPHEN 325 MG PO TABS
650.0000 mg | ORAL_TABLET | Freq: Once | ORAL | Status: AC
  Administered 2023-05-22: 650 mg via ORAL
  Filled 2023-05-22: qty 2

## 2023-05-22 MED ORDER — BOOST / RESOURCE BREEZE PO LIQD CUSTOM
1.0000 | Freq: Three times a day (TID) | ORAL | Status: DC
  Administered 2023-05-22 – 2023-05-24 (×5): 1 via ORAL

## 2023-05-22 MED ORDER — SODIUM CHLORIDE 0.9% IV SOLUTION: Freq: Once | INTRAVENOUS | Status: AC

## 2023-05-22 NOTE — Progress Notes (Signed)
 Tami Schmidt 11:18 AM  Subjective: Patient had 1 bout of bright red blood this morning but feels the same and did not have any bleeding yesterday in the hospital and has no other complaints  Objective: Vital signs stable afebrile no acute distress abdomen is soft nontender And creatinine normal hemoglobin did drop from 9-7.8  Assessment: Diverticular bleeding  Plan: Continue clear liquids consider repeat CTA if bleeding increases and we discussed possible angiogram or possible repeat colon and please call me if signs of increased bleeding otherwise we will continue to follow tomorrow  [facility]  office [phone] After 5PM or if no answer call [phone]

## 2023-05-22 NOTE — Plan of Care (Signed)

## 2023-05-22 NOTE — Progress Notes (Addendum)
 PROGRESS NOTE Tami Schmidt  FMW:[id number] DOB: 23-Jan-1933 DOA: 05/21/2023 PCP: Freddrick, No  Brief Narrative/Hospital Course: 89 year old female with history of recurrent breast cancer on infusion every 3 weeks-recent pet scan 1/17 right hilar metastatic disease suspected lytic lesion of the left second rib, cholelithiasis, severe degenerative glenohumeral arthropathy thoracolumbar spondylosis, diabetes mellitus type 2, hypertension, lymphedema, chronic constipation presented with bright red blood rectum 05/20/23 around 2 pm.. In the ED initially hypotensive 80/60 hypoxic 50% on room air placed on 8 L simple mask upon arrival appeared cold and clammy. Subsequently blood pressure 90s to 100, doing well on room air. Labs obtained showing hypokalemia 3.1 hyperglycemia 203 bicarb 19 hemoglobin 8.5.  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 Being managed for probable diverticular bleeding.  Patient received blood transfusion based upon her is in background history.  She was due for chemotherapy and seen by oncology.     Subjective: Had liquid BM with blood and clot this am Hr in 70s  Wants to eat more solid No abdomen pain chest pain fever or chills Taking CLEARS   Assessment and Plan: Principal Problem:   GI bleed Active Problems:   Chest wall recurrence of breast cancer (HCC)   Essential hypertension   Lymphedema of upper extremity   Diabetes mellitus type II, controlled, with no complications (HCC)   ABLA (acute blood loss anemia)   Lower GI bleeding ABLA on anemia chronic: Likely diverticular bleeding. Similar  episode/hospitalization back in September-hemoglobin down up to 6.7 g needing 2 L PRBC taht time. S/p[ 2 u prbc 2/6.  Hemoglobin stable continue to monitor having rectal bleeding this morning -GI informed, will continue to keep n.p.o. check serial H&H.  CT angiogram pelvis reviewed no active GI bleed noted.  Continue PPI avoid anticoagulants and monitor in stepdown Continue plan per GI Repeat hb down to 7.8-1 unit PRBC ordered this morning Recent Labs  Lab 05/21/23 0415 05/21/23 1612 05/21/23 2111 05/22/23 0528 05/22/23 0918  HGB 7.6* 10.0* 9.6* 9.0* 7.8*  HCT 24.2* 31.2* 29.4* 27.8* 24.3*    Essential hypertension: BP soft/hypotensive on admission holding Norvasc  and Benicar for now  T2DM: With uncontrolled hyperglycemia-blood sugar stable currently continue SSI Recent Labs  Lab 05/21/23 0810 05/21/23 1153 05/21/23 1613 05/22/23 0010 05/22/23 0531  GLUCAP 97 87 86 106* 96    Triple positive breast cancer with recurrence: recent pet scan 1/17 right hilar metastatic disease suspected lytic lesion of the left second rib, followed by oncology on infusion every 3 weeks-due today, msg sent to Dr Loretha  Hypokalemia Metabolic acidosis: Electrolytes stable and improved.    History of PE Remote no longer on anticoagulation  Chronic back pain: Stable.  DVT prophylaxis: SCDs Start: 05/21/23 0444 Code Status:   Code Status: Full Code Family Communication: plan of care discussed with patient/none at bedside. Patient status is: Remains hospitalized because of severity of illness Level of care: Stepdown   Dispo: The patient is from: Lives with husband at home and independent at baseline             Anticipated disposition: TBD Objective: Vitals last 24 hrs: Vitals:   05/22/23 0700  05/22/23 0800 05/22/23 0827 05/22/23 0900  BP: (!) 153/66 (!) 163/78  (!) 94/46  Pulse: 66 96  72  Resp: 17 15  15   Temp:   97.6 F (36.4 C)   TempSrc:   Oral   SpO2: 99% 100%  98%  Weight:       Height:       Weight change: 0.942 kg  Physical Examination: General exam: alert awake,at baseline, older than stated age HEENT:Oral mucosa moist, Ear/Nose WNL grossly Respiratory system: Bilaterally clear BS,no use of accessory muscle Cardiovascular system: S1 & S2 +, No JVD. Gastrointestinal system: Abdomen soft,NT,ND, BS+ Nervous System: Alert, awake, moving all extremities,and following commands. Extremities: LE edema neg,distal peripheral pulses palpable and warm.  Skin: No rashes,no icterus. MSK: Normal muscle bulk,tone, power   Medications reviewed:  Scheduled Meds:  sodium chloride    Intravenous Once   acetaminophen   650 mg Oral Once   Chlorhexidine  Gluconate Cloth  6 each Topical Daily   feeding supplement  1 Container Oral TID BM   insulin  aspart  0-15 Units Subcutaneous Q4H   pantoprazole  (PROTONIX ) IV  40 mg Intravenous Q24H   sodium chloride  flush  10-40 mL Intracatheter Q12H   Continuous Infusions:  0.9 % NaCl with KCl 20 mEq / L 50 mL/hr at 05/22/23 0950     Diet Order             Diet clear liquid Fluid consistency: Thin  Diet effective now                  Intake/Output Summary (Last 24 hours) at 05/22/2023 1014 Last data filed at 05/22/2023 0950 Gross per 24 hour  Intake 2413.01 ml  Output --  Net 2413.01 ml   Net IO Since Admission: 2,728.01 mL [05/22/23 1014]  Wt Readings from Last 3 Encounters:  05/21/23 77.6 kg  04/30/23 77.7 kg  04/09/23 76.2 kg     Unresulted Labs (From admission, onward)     Start     Ordered   05/23/23 0000  Hemoglobin and hematocrit, blood  Now then every 8 hours,   R     Question:  Specimen collection method  Answer:  Unit=Unit collect   05/22/23 0906   05/22/23 1014  Prepare RBC (crossmatch)  (Blood Administration Adult)  Once,   R       Question Answer Comment  # of Units 1 unit   Transfusion Indications Hemoglobin 8 gm/dL or less and orthopedic or cardiac surgery or pre-existing cardiac condition   Number of  Units to Keep Ahead NO units ahead   If emergent release call blood bank Not emergent release      05/22/23 1013   05/22/23 0906  Hemoglobin and hematocrit, blood  Now then every 8 hours,   R     Question:  Specimen collection method  Answer:  Unit=Unit collect   05/22/23 0906   05/22/23 0500  CBC  Daily,   R      05/21/23 0743   05/22/23 0500  Basic metabolic panel  Daily,   R      05/21/23 0743   05/21/23 0400  Hemoglobin and hematocrit, blood  Now then every 6 hours,   R (with TIMED occurrences)      05/21/23 0245          Data Reviewed: I have personally reviewed following labs and imaging studies CBC: Recent Labs  Lab 05/21/23 0054 05/21/23 0415 05/21/23 1612 05/21/23 2111 05/22/23 9471  05/22/23 0918  WBC 6.0  --   --   --  5.5  --   NEUTROABS 4.4  --   --   --   --   --   HGB 8.5* 7.6* 10.0* 9.6* 9.0* 7.8*  HCT 27.2* 24.2* 31.2* 29.4* 27.8* 24.3*  MCV 79.3*  --   --   --  81.8  --   PLT 167  --   --   --  144*  --    Basic Metabolic Panel:  Recent Labs  Lab 05/21/23 0054 05/21/23 0530 05/22/23 0528  NA 132* 139 138  K 3.1* 3.0* 3.5  CL 101 109 108  CO2 19* 21* 23  GLUCOSE 223* 126* 104*  BUN 21 19 14   CREATININE 0.67 0.52 0.50  CALCIUM 8.3* 7.7* 8.6*   GFR: Estimated Creatinine Clearance: 52.2 mL/min (by C-G formula based on SCr of 0.5 mg/dL). Liver Function Tests:  Recent Labs  Lab 05/21/23 0054  AST 28  ALT 17  ALKPHOS 65  BILITOT 0.5  PROT 6.0*  ALBUMIN 2.9*  Sepsis Labs: No results for input(s): PROCALCITON, LATICACIDVEN in the last 168 hours. Recent Results (from the past 240 hours)  MRSA Next Gen by PCR, Nasal     Status: None   Collection Time: 05/21/23  4:12 PM   Specimen: Nasal Mucosa; Nasal Swab  Result Value Ref Range Status   MRSA by PCR Next Gen NOT DETECTED NOT DETECTED Final    Comment: (NOTE) The GeneXpert MRSA Assay (FDA approved for NASAL specimens only), is one component of a comprehensive MRSA colonization  surveillance program. It is not intended to diagnose MRSA infection nor to guide or monitor treatment for MRSA infections. Test performance is not FDA approved in patients less than 30 years old. Performed at [facility], [address] [place], [place] [address]     Antimicrobials/Microbiology: Anti-infectives (From admission, onward)    None         Component Value Date/Time   SDES BLOOD RIGHT ARM 12/02/2017 0908   SPECREQUEST  12/02/2017 0908    BOTTLES DRAWN AEROBIC AND ANAEROBIC Blood Culture adequate volume   CULT  12/02/2017 0908    NO GROWTH 5 DAYS Performed at [facility], [address], [place], [place] [address]    REPTSTATUS 12/07/2017 FINAL 12/02/2017 0908     Radiology Studies: CT ANGIO ABDOMEN PELVIS  W &/OR WO CONTRAST Result Date: 05/21/2023 CLINICAL DATA:  Lower GI bleed with bright red blood. History of breast cancer with bilateral mastectomy and XRT. EXAM: CTA ABDOMEN AND PELVIS WITHOUT AND WITH CONTRAST TECHNIQUE: Multidetector CT imaging of the abdomen and pelvis was performed using the standard protocol during bolus administration of intravenous contrast. Multiplanar reconstructed images and MIPs were obtained and reviewed to evaluate the vascular anatomy. RADIATION DOSE REDUCTION: This exam was performed according to the departmental dose-optimization program which includes automated exposure control, adjustment of the mA and/or kV according to patient size and/or use of iterative reconstruction technique. CONTRAST:  OMNIPAQUE  IOHEXOL  350 MG/ML SOLN COMPARISON:  CTA GI bleed scan 10/04/2022, and chest, abdomen and pelvis CT with contrast 12/18/2022. FINDINGS: VASCULAR Aorta: Normal caliber aorta without aneurysm, dissection, vasculitis or significant stenosis. There are moderate patchy nonstenosing calcific plaques. 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 There are  nonstenosing ostial calcific plaques also. There is no flow-significant stenosis, occlusion or dissection. There  are patchy moderate branch calcific plaques of the splenic artery. SMA: Patent without evidence of aneurysm, dissection, vasculitis or significant stenosis. No significant plaque disease. Renals: Again there is moderate mixed plaque in proximal 1.4 cm of the right renal artery, narrowing this segment of the vessel up to 50%. The rest of the right renal artery is widely patent without branch occlusion. On the left, ostial calcific plaques again narrow the vessel origin by 70%. The rest of the vessel widely patent without branch occlusions. Both renal arteries are single. IMA: Patent without evidence of aneurysm, dissection, vasculitis or significant stenosis. Inflow: There are moderate patchy calcific plaques in the inflow arteries. The bilateral common iliac and external iliac arteries show no flow-limiting stenosis. Both internal iliac arteries are more heavily calcified and more moderately stenotic Proximal Outflow: There are moderate calcific plaques in the common femoral and superficial femoral arteries. No flow-limiting stenosis is seen the visualized portions. Visualized deep femoral arteries are clear. Veins: The bilateral external iliac veins very small in caliber. Remainder of the regional venous structures opacify well. There is no portal vein dilatation. This was seen previously. Review of the MIP images confirms the above findings. NON-VASCULAR Lower chest: Mild cardiomegaly. There calcifications in the mitral ring and coronary arteries. No pericardial effusion. Scattered scarring both lung bases. Small hiatal hernia. Hepatobiliary: Cholelithiasis without evidence of acute cholecystitis. No hepatic mass enhancement. No biliary dilatation. Pancreas: Occasional parenchymal calcifications in the proximal gland consistent with chronic calcific pancreatitis. No evidence of acute pancreatitis or  mass. Spleen: No abnormality. Adrenals/Urinary Tract: Mild adrenal hyperplasia is again seen. No mass. No renal mass enhancement, stones or hydronephrosis. Bilateral small stable Bosniak 1 and Bosniak 2 type renal cysts are unaltered. No imaging follow-up recommended. Unremarkable bladder. Stomach/Bowel: Small hiatal hernia. No GI tract dilatation or overt wall thickening. An appendix is not seen. There is moderate fecal stasis. Advanced sigmoid diverticulosis. No wall thickening or inflammatory change. No active GI bleed is seen. Lymphatic: No adenopathy. Reproductive: Status post hysterectomy. No adnexal masses. Other: Large low midline bowel containing ventral hernia. No upstream bowel obstruction at this time. There is no free fluid, free hemorrhage or free air. Multiple pelvic phleboliths. Musculoskeletal: Right hip arthroplasty. Osteopenia and degenerative change of the spine. Extensive lower thoracic spine bridging enthesopathy and advanced lumbar degenerative disc disease. No acute or other significant osseous findings. Multilevel acquired lumbar spinal stenosis. IMPRESSION: 1. No active GI bleed is seen. 2. Aortic and branch vessel atherosclerosis. 3. 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 4. Stable 50% stenosis of the proximal right renal artery and 70% stenosis of the left renal artery origin. 5. Cardiomegaly with mitral ring and coronary artery calcifications. 6. Constipation and diverticulosis. 7. Large low midline bowel containing ventral hernia without upstream bowel obstruction. 8. Cholelithiasis. 9. Chronic calcific pancreatitis. 10. Small hiatal hernia. 11. Osteopenia and advanced degenerative change. Aortic Atherosclerosis (ICD10-I70.0). Electronically Signed   By: Francis Quam M.D.   On: 05/21/2023 06:27     LOS: 1 day   Total time spent in review of labs and imaging, patient evaluation, formulation of plan, documentation and communication with  family: 50 minutes  Mennie LAMY, MD  [facility]  05/22/2023, 10:14 AM

## 2023-05-22 NOTE — Plan of Care (Signed)
  Problem: Fluid Volume: Goal: Ability to maintain a balanced intake and output will improve Outcome: Progressing   Problem: Health Behavior/Discharge Planning: Goal: Ability to identify and utilize available resources and services will improve Outcome: Progressing Goal: Ability to manage health-related needs will improve Outcome: Progressing   Problem: Nutritional: Goal: Maintenance of adequate nutrition will improve Outcome: Progressing   Problem: Skin Integrity: Goal: Risk for impaired skin integrity will decrease Outcome: Progressing   Problem: Tissue Perfusion: Goal: Adequacy of tissue perfusion will improve Outcome: Progressing   Problem: Clinical Measurements: Goal: Will remain free from infection Outcome: Progressing Goal: Respiratory complications will improve Outcome: Progressing   Problem: Nutrition: Goal: Adequate nutrition will be maintained Outcome: Progressing   Problem: Elimination: Goal: Will not experience complications related to urinary retention Outcome: Progressing   Problem: Pain Managment: Goal: General experience of comfort will improve and/or be controlled Outcome: Progressing   Problem: Safety: Goal: Ability to remain free from injury will improve Outcome: Progressing   Problem: Skin Integrity: Goal: Risk for impaired skin integrity will decrease Outcome: Progressing

## 2023-05-23 DIAGNOSIS — K5791 Diverticulosis of intestine, part unspecified, without perforation or abscess with bleeding: Secondary | ICD-10-CM | POA: Diagnosis not present

## 2023-05-23 LAB — CBC
HCT: 26.5 % — ABNORMAL LOW (ref 36.0–46.0)
Hemoglobin: 8.4 g/dL — ABNORMAL LOW (ref 12.0–15.0)
MCH: 26.8 pg (ref 26.0–34.0)
MCHC: 31.7 g/dL (ref 30.0–36.0)
MCV: 84.4 fL (ref 80.0–100.0)
Platelets: 143 10*3/uL — ABNORMAL LOW (ref 150–400)
RBC: 3.14 MIL/uL — ABNORMAL LOW (ref 3.87–5.11)
RDW: 16.6 % — ABNORMAL HIGH (ref 11.5–15.5)
WBC: 6.1 10*3/uL (ref 4.0–10.5)
nRBC: 0 % (ref 0.0–0.2)

## 2023-05-23 LAB — BASIC METABOLIC PANEL
Anion gap: 6 (ref 5–15)
BUN: 13 mg/dL (ref 8–23)
CO2: 24 mmol/L (ref 22–32)
Calcium: 8.3 mg/dL — ABNORMAL LOW (ref 8.9–10.3)
Chloride: 107 mmol/L (ref 98–111)
Creatinine, Ser: 0.48 mg/dL (ref 0.44–1.00)
GFR, Estimated: >60 mL/min (ref >60)
Glucose, Bld: 98 mg/dL (ref 70–99)
Potassium: 3.5 mmol/L (ref 3.5–5.1)
Sodium: 137 mmol/L (ref 135–145)

## 2023-05-23 LAB — HEMOGLOBIN AND HEMATOCRIT, BLOOD
HCT: 27.2 % — ABNORMAL LOW (ref 36.0–46.0)
HCT: 26.7 % — ABNORMAL LOW (ref 36.0–46.0)
Hemoglobin: 8.6 g/dL — ABNORMAL LOW (ref 12.0–15.0)
Hemoglobin: 8.3 g/dL — ABNORMAL LOW (ref 12.0–15.0)

## 2023-05-23 LAB — GLUCOSE, CAPILLARY
Glucose-Capillary: 83 mg/dL (ref 70–99)
Glucose-Capillary: 86 mg/dL (ref 70–99)
Glucose-Capillary: 158 mg/dL — ABNORMAL HIGH (ref 70–99)
Glucose-Capillary: 95 mg/dL (ref 70–99)
Glucose-Capillary: 169 mg/dL — ABNORMAL HIGH (ref 70–99)
Glucose-Capillary: 145 mg/dL — ABNORMAL HIGH (ref 70–99)

## 2023-05-23 MED ORDER — INSULIN ASPART 100 UNIT/ML IJ SOLN
0.0000 [IU] | Freq: Three times a day (TID) | INTRAMUSCULAR | Status: DC
  Administered 2023-05-23: 1 [IU] via SUBCUTANEOUS

## 2023-05-23 NOTE — Progress Notes (Signed)
 PROGRESS NOTE VERINA GALENO  FMW:[id number] DOB: [birth year] DOA: 05/21/2023 PCP: Freddrick, No  Brief Narrative/Hospital Course: 89 year old female with history of recurrent breast cancer on infusion every 3 weeks-recent pet scan 1/17 right hilar metastatic disease suspected lytic lesion of the left second rib, cholelithiasis, severe degenerative glenohumeral arthropathy thoracolumbar spondylosis, diabetes mellitus type 2, hypertension, lymphedema, chronic constipation presented with bright red blood rectum 05/20/23 around 2 pm.. In the ED initially hypotensive 80/60 hypoxic 50% on room air placed on 8 L simple mask upon arrival appeared cold and clammy. Subsequently blood pressure 90s to 100, doing well on room air. Labs obtained showing hypokalemia 3.1 hyperglycemia 203 bicarb 19 hemoglobin 8.5.  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 Being managed for probable diverticular bleeding.  Patient received 2 units prbc on 2/6 and 1 more unit on 2/7   Subjective: Resting comfortably no complaints no more than 2 L of history morning Eager to eat more solid food  Overnight vitals stable afebrile although hypoglycemia Labs showing stable hemoglobin at 8.4 g since yesterday   Assessment and Plan: Principal Problem:   GI bleed Active Problems:   Chest wall recurrence of breast cancer (HCC)   Essential hypertension   Lymphedema of upper extremity   Diabetes mellitus type II, controlled, with no complications (HCC)   ABLA (acute blood loss anemia)   Lower GI bleeding ABLA on anemia chronic: Likely diverticular bleeding.  Similar episode/hospitalization back in September-hemoglobin down up to 6.7 g needing 2 L PRBC that time. S/p 2 units prbc on 2/6 and 1 more unit on 2/7. Had 3 episodes of bleeding on 2/7 AM. GI following-obtain CTA if bleeding recurs-continue to trend hemoglobin. continue PPI. Cont CLD and advance to soft diet for dinner-hopefully home tomorrow if no more bleeding. Recent Labs  Lab 05/22/23 0528 05/22/23 0918 05/22/23 1808 05/22/23 2309 05/23/23 0500  HGB 9.0* 7.8* 9.5* 8.4* 8.4*  HCT 27.8* 24.3* 30.0* 26.4* 26.5*    Essential hypertension: BP soft/hypotensive on admission,holding Norvasc  and Benicar for now.  Blood pressure well-controlled  T2DM with hyperglycemia/controlled hyperglycemia: Changed to sensitive scale 0 to 6 units ACHS  Recent Labs  Lab 05/22/23 1929 05/22/23 2328 05/22/23 2349 05/23/23 0344 05/23/23 0755  GLUCAP 171* 61* 82 83 86    Triple positive breast cancer with recurrence: recent pet scan 1/17 right hilar metastatic disease suspected lytic lesion of the left second rib, followed by oncology on infusion every 3 weeks-due today, msg sent to Dr Loretha  Hypokalemia Metabolic acidosis: Stable/improved   History of PE Remote no longer on anticoagulation  Chronic back pain: Stable.  DVT prophylaxis: SCDs Start: 05/21/23 0444 Code Status:   Code Status: Full Code Family Communication: plan of care discussed with patient/none at bedside. Patient status is: Remains hospitalized because of severity of illness Level of care: Stepdown   Dispo: The patient is from: Lives with husband at home and independent at baseline             Anticipated disposition: TBD Objective: Vitals last 24 hrs: Vitals:   05/23/23 0200 05/23/23 0300 05/23/23 0345 05/23/23 0700  BP: (!) 138/59 (!) 121/50  ROLLEN)  126/52  Pulse: 69 67  66  Resp: 20 18  19   Temp:   98.3 F (36.8 C)   TempSrc:   Oral   SpO2: 96% 96%  96%  Weight:      Height:       Weight change:   Physical  Examination: General exam: alert awake, oriented  HEENT:Oral mucosa moist, Ear/Nose WNL grossly Respiratory system: Bilaterally clear BS,no use of accessory muscle Cardiovascular system: S1 & S2 +, No JVD. Gastrointestinal system: Abdomen soft,NT,ND, BS+ Nervous System: Alert, awake, moving all extremities,and following commands. Extremities: LE edema neg,distal peripheral pulses palpable and warm.  Skin: No rashes,no icterus. MSK: Normal muscle bulk,tone, power   Medications reviewed:  Scheduled Meds:  Chlorhexidine  Gluconate Cloth  6 each Topical Daily   feeding supplement  1 Container Oral TID BM   insulin  aspart  0-6 Units Subcutaneous TID WC   pantoprazole  (PROTONIX ) IV  40 mg Intravenous Q24H   sodium chloride  flush  10-40 mL Intracatheter Q12H  Continuous Infusions:   Diet Order             Diet clear liquid Fluid consistency: Thin  Diet effective now                  Intake/Output Summary (Last 24 hours) at 05/23/2023 0811 Last data filed at 05/22/2023 2049 Gross per 24 hour  Intake 1077.19 ml  Output --  Net 1077.19 ml   Net IO Since Admission: 3,651.7 mL [05/23/23 0811]  Wt Readings from Last 3 Encounters:  05/21/23 77.6 kg  04/30/23 77.7 kg  04/09/23 76.2 kg     Unresulted Labs (From admission, onward)     Start     Ordered   05/23/23 0000  Hemoglobin and hematocrit, blood  Now then every 8 hours,   R (with TIMED occurrences)     Question:  Specimen collection method  Answer:  Unit=Unit collect   05/22/23 0906   05/22/23 0500  CBC  Daily,   R      05/21/23 0743   05/22/23 0500  Basic metabolic panel  Daily,   R      05/21/23 0743   05/21/23 0400  Hemoglobin and hematocrit, blood  Now then every 6 hours,   R (with TIMED occurrences)      05/21/23 0245          Data Reviewed: I have personally reviewed following labs and imaging studies CBC: Recent Labs  Lab 05/21/23 0054 05/21/23 0415 05/22/23 0528 05/22/23 0918 05/22/23 1808 05/22/23 2309  05/23/23 0500  WBC 6.0  --  5.5  --   --   --  6.1  NEUTROABS 4.4  --   --   --   --   --   --   HGB 8.5*   < > 9.0* 7.8* 9.5* 8.4* 8.4*  HCT 27.2*   < > 27.8* 24.3* 30.0* 26.4* 26.5*  MCV 79.3*  --  81.8  --   --   --  84.4  PLT 167  --  144*  --   --   --  143*   < > = values in this interval not displayed.   Basic Metabolic Panel:  Recent Labs  Lab 05/21/23 0054 05/21/23 0530 05/22/23 0528 05/23/23 0500  NA 132* 139 138 137  K 3.1* 3.0* 3.5 3.5  CL 101 109 108 107  CO2 19* 21* 23 24  GLUCOSE 223* 126* 104* 98  BUN 21 19  14 13  CREATININE 0.67 0.52 0.50 0.48  CALCIUM 8.3* 7.7* 8.6* 8.3*   GFR: Estimated Creatinine Clearance: 52.2 mL/min (by C-G formula based on SCr of 0.48 mg/dL). Liver Function Tests:  Recent Labs  Lab 05/21/23 0054  AST 28  ALT 17  ALKPHOS 65  BILITOT 0.5  PROT 6.0*  ALBUMIN 2.9*  Sepsis Labs: No results for input(s): PROCALCITON, LATICACIDVEN in the last 168 hours. Recent Results (from the past 240 hours)  MRSA Next Gen by PCR, Nasal     Status: None   Collection Time: 05/21/23  4:12 PM   Specimen: Nasal Mucosa; Nasal Swab  Result Value Ref Range Status   MRSA by PCR Next Gen NOT DETECTED NOT DETECTED Final    Comment: (NOTE) The GeneXpert MRSA Assay (FDA approved for NASAL specimens only), is one component of a comprehensive MRSA colonization surveillance program. It is not intended to diagnose MRSA infection nor to guide or monitor treatment for MRSA infections. Test performance is not FDA approved in patients less than 2 years old. Performed at [facility], [address], [place], [place] [address]     Antimicrobials/Microbiology: Anti-infectives (From admission, onward)    None         Component Value Date/Time   SDES BLOOD RIGHT ARM 12/02/2017 0908   SPECREQUEST  12/02/2017 0908    BOTTLES DRAWN AEROBIC AND ANAEROBIC Blood Culture adequate volume   CULT  12/02/2017 0908    NO GROWTH 5  DAYS Performed at [facility], [address] [place], [place] [address]    REPTSTATUS 12/07/2017 FINAL 12/02/2017 0908     Radiology Studies: No results found.  LOS: 2 days   Total time spent in review of labs and imaging, patient evaluation, formulation of plan, documentation and communication with family: 3  Mennie LAMY, MD  [facility]  05/23/2023, 8:11 AM

## 2023-05-23 NOTE — Progress Notes (Signed)
 Hypoglycemic Event  CBG: 61@2328   Treatment: 4oz orange juice  Symptoms: None - denies hypoglycemic symptoms  Follow-up CBG: Time:82 CBG Result:82  Possible Reasons for Event: Inadequate meal intake and Medication regimen: SSI q4hr, on clear liquid diet due to GI bleed  Comments/MD notified: Discussed w/Abigail Andrez NP re. CBG coverage at 2000 hours (see med regimen and diet above). Orders to continue coverage at 2000 and continue q4hr CBGs, instead of ac/hs, due to CBGs dropping to 80s-90s Notified to hypoglycemic episode above, treatment and follow-up CBG.  Cindy S. Loreli BSN, RN, [facility], CCRN 05/23/2023 12:30 AM

## 2023-05-23 NOTE — Plan of Care (Signed)
  Problem: Fluid Volume: Goal: Ability to maintain a balanced intake and output will improve Outcome: Progressing   Problem: Health Behavior/Discharge Planning: Goal: Ability to identify and utilize available resources and services will improve Outcome: Progressing Goal: Ability to manage health-related needs will improve Outcome: Progressing   Problem: Metabolic: Goal: Ability to maintain appropriate glucose levels will improve Outcome: Progressing

## 2023-05-23 NOTE — Progress Notes (Signed)
 Tami Schmidt 9:28 AM  Subjective: Patient doing well no bleeding since yesterday morning and no bowel movement no new complaints and case discussed with primary team  Objective: Vital signs stable afebrile no acute distress abdomen is soft nontender BUN and creatinine normal hemoglobin stable  Assessment: Diverticular bleeding  Plan: If no signs of bleeding today may advance diet this evening to soft foods and possibly discharge tomorrow if stable and no bleeding  Tami Schmidt  office [phone] After 5PM or if no answer call [phone]

## 2023-05-24 DIAGNOSIS — K5791 Diverticulosis of intestine, part unspecified, without perforation or abscess with bleeding: Secondary | ICD-10-CM | POA: Diagnosis not present

## 2023-05-24 LAB — BASIC METABOLIC PANEL
Anion gap: 8 (ref 5–15)
BUN: 12 mg/dL (ref 8–23)
CO2: 24 mmol/L (ref 22–32)
Calcium: 8.5 mg/dL — ABNORMAL LOW (ref 8.9–10.3)
Chloride: 108 mmol/L (ref 98–111)
Creatinine, Ser: 0.52 mg/dL (ref 0.44–1.00)
GFR, Estimated: >60 mL/min (ref >60)
Glucose, Bld: 97 mg/dL (ref 70–99)
Potassium: 3.2 mmol/L — ABNORMAL LOW (ref 3.5–5.1)
Sodium: 140 mmol/L (ref 135–145)

## 2023-05-24 LAB — CBC
HCT: 26 % — ABNORMAL LOW (ref 36.0–46.0)
Hemoglobin: 8.5 g/dL — ABNORMAL LOW (ref 12.0–15.0)
MCH: 27.8 pg (ref 26.0–34.0)
MCHC: 32.7 g/dL (ref 30.0–36.0)
MCV: 85 fL (ref 80.0–100.0)
Platelets: 153 10*3/uL (ref 150–400)
RBC: 3.06 MIL/uL — ABNORMAL LOW (ref 3.87–5.11)
RDW: 17.1 % — ABNORMAL HIGH (ref 11.5–15.5)
WBC: 5.6 10*3/uL (ref 4.0–10.5)
nRBC: 0 % (ref 0.0–0.2)

## 2023-05-24 LAB — GLUCOSE, CAPILLARY: Glucose-Capillary: 82 mg/dL (ref 70–99)

## 2023-05-24 MED ORDER — POTASSIUM CHLORIDE CRYS ER 20 MEQ PO TBCR: 20.0000 meq | EXTENDED_RELEASE_TABLET | Freq: Every day | ORAL | 0 refills | Status: DC

## 2023-05-24 MED ORDER — POTASSIUM CHLORIDE CRYS ER 20 MEQ PO TBCR: 40.0000 meq | EXTENDED_RELEASE_TABLET | Freq: Once | ORAL | Status: DC

## 2023-05-24 MED ORDER — POTASSIUM CHLORIDE CRYS ER 20 MEQ PO TBCR
40.0000 meq | EXTENDED_RELEASE_TABLET | Freq: Once | ORAL | Status: AC
  Administered 2023-05-24: 40 meq via ORAL
  Filled 2023-05-24: qty 2

## 2023-05-24 MED ORDER — HEPARIN SOD (PORK) LOCK FLUSH 100 UNIT/ML IV SOLN
500.0000 [IU] | Freq: Once | INTRAVENOUS | Status: AC
  Administered 2023-05-24: 500 [IU] via INTRAVENOUS
  Filled 2023-05-24 (×2): qty 5

## 2023-05-24 NOTE — Plan of Care (Signed)
  Problem: Health Behavior/Discharge Planning: Goal: Ability to identify and utilize available resources and services will improve Outcome: Progressing   Problem: Nutritional: Goal: Maintenance of adequate nutrition will improve Outcome: Progressing   Problem: Skin Integrity: Goal: Risk for impaired skin integrity will decrease Outcome: Progressing   Problem: Tissue Perfusion: Goal: Adequacy of tissue perfusion will improve Outcome: Progressing

## 2023-05-24 NOTE — Discharge Summary (Signed)
 Physician Discharge Summary  Tami Schmidt FMW:[id number] DOB: 10/08/1932 DOA: 05/21/2023  PCP: Pcp, No  Admit date: 05/21/2023 Discharge date: 05/24/2023 Recommendations for Outpatient Follow-up:  Follow up with PCP in 1 weeks-call for appointment Please obtain BMP/CBC in one week  Discharge Dispo: home Discharge Condition: Stable Code Status:   Code Status: Full Code Diet recommendation:  Diet Order             DIET SOFT Room service appropriate? Yes; Fluid consistency: Thin  Diet effective now                 Brief/Interim Summary: 89 year old female with history of recurrent breast cancer on infusion every 3 weeks-recent pet scan 1/17 right hilar metastatic disease suspected lytic lesion of the left second rib, cholelithiasis, severe degenerative glenohumeral arthropathy thoracolumbar spondylosis, diabetes mellitus type 2, hypertension, lymphedema, chronic constipation presented with bright red blood rectum 05/20/23 around 2 pm.. In the ED initially hypotensive 80/60 hypoxic 50% on room air placed on 8 L simple mask upon arrival appeared cold and clammy. Subsequently blood pressure 90s to 100, doing well on room air. Labs obtained showing hypokalemia 3.1 hyperglycemia 203 bicarb 19 hemoglobin 8.5.  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 Being managed for probable diverticular bleeding.  Patient received 2 units prbc on 2/6 and 1 more unit on 2/7 No recurrence of rectal bleeding since 2/7 AM, diet slowly advanced, tolerating soft diet since 2/8 dinner tome. Hemoglobin remains stable at 8.5 g.  At this time  she will be discharged home with outpatient follow-up with her GI   Discharge Diagnoses:  Principal Problem:   GI bleed Active Problems:   Chest wall recurrence of breast cancer (HCC)   Essential hypertension   Lymphedema of upper extremity   Diabetes mellitus type II, controlled, with no complications (HCC)   ABLA (acute blood loss anemia)   Lower GI bleeding ABLA on anemia chronic: Likely diverticular bleeding. Similar episode/hospitalization back in September-hemoglobin down up to 6.7 g needing 2 L PRBC that time. S/p 2 units prbc on 2/6 and 1 more unit on 2/7. Had 3 episodes of bleeding on 2/7 AM. And no more recurrence of bleeding at this time She is stable for discharge. Recent Labs  Lab 05/22/23 2309 05/23/23 0500 05/23/23 0825 05/23/23 1930 05/24/23 0351  HGB 8.4* 8.4* 8.6* 8.3* 8.5*  HCT 26.4* 26.5* 27.2* 26.7* 26.0*    Essential hypertension: BP soft/hypotensive on admission,holding Norvasc  and Benicar for now.  Will advise her to follow-up with blood pressure and discuss with PCP about resuming meds mainly her diuretic,ok to  continue Norvasc   T2DM with hypoglycemia/uncontrolled hyperglycemia: No more hypoglycemia blood sugar stable Recent Labs  Lab 05/23/23 1139 05/23/23 1551 05/23/23 2004 05/23/23 2156 05/24/23 0759  GLUCAP 158* 95 169* 145* 82    Triple positive breast cancer with recurrence: recent pet scan 1/17 right hilar metastatic disease suspected lytic lesion of the left second rib, followed by oncology on infusion every 3 weeks-due today, msg sent to Dr Loretha  Hypokalemia Metabolic acidosis: Replacement given and will d/c on PO kcl.  History of PE Remote no longer on  anticoagulation  Chronic back pain: Stable.   Consults: Gastroenterology, hematology oncology Subjective: Doing well and reels ready to go home this am after eating  Discharge Exam: Vitals:   05/24/23 0340 05/24/23 0800  BP:    Pulse:    Resp:    Temp: 98.2 F (36.8  C) 98.1 F (36.7 C)  SpO2:     General: Pt is alert, awake, not in acute distress Cardiovascular: RRR, S1/S2 +, no rubs, no gallops Respiratory: CTA bilaterally, no wheezing, no rhonchi Abdominal: Soft, NT, ND, bowel sounds + Extremities: no edema, no cyanosis  Discharge Instructions  Discharge Instructions     Discharge instructions   Complete by: As directed    Please call call MD or return to ER for similar or worsening recurring problem that brought you to hospital or if any fever,nausea/vomiting,abdominal pain, uncontrolled pain, chest pain,  shortness of breath or any other alarming symptoms.  Please follow-up your doctor as instructed in a week time and call the office for appointment.  Please avoid alcohol, smoking, or any other illicit substance and maintain healthy habits including taking your regular medications as prescribed.  You were cared for by a hospitalist during your hospital stay. If you have any questions about your discharge medications or the care you received while you were in the hospital after you are discharged, you can call the unit and ask to speak with the hospitalist on call if the hospitalist that took care of you is not available.  Once you are discharged, your primary care physician will handle any further medical issues. Please note that NO REFILLS for any discharge medications will be authorized once you are discharged, as it is imperative that you return to your primary care physician (or establish a relationship with a primary care physician if you do not have one) for your aftercare needs so that they can reassess your need for medications and monitor your lab values   Increase activity slowly   Complete by: As directed       Allergies as of 05/24/2023       Reactions   Bactrim Swelling   SWELLING OF MOUTH/FACE.   Lisinopril Swelling   SWELLING OF MOUTH/FACE.   Vasotec Swelling   SWELLING OF MOUTH/FACE.   Sulfamethoxazole-trimethoprim     Other reaction(s): Unknown        Medication List     STOP taking these medications    olmesartan-hydrochlorothiazide  40-12.5 MG tablet Commonly known as: BENICAR HCT       TAKE these medications    acetaminophen  500 MG tablet Commonly known as: TYLENOL  Take 1,000 mg by mouth as needed for moderate pain.   amLODipine  10 MG tablet Commonly known as: NORVASC  TAKE 1 TABLET BY MOUTH DAILY   ferrous sulfate  325 (65 FE) MG EC tablet Take 1 tablet by mouth every morning.   metFORMIN  500 MG tablet Commonly known as: GLUCOPHAGE  Take 500 mg by mouth daily with breakfast.   potassium chloride  SA 20 MEQ tablet Commonly known as: KLOR-CON  M Take 1 tablet (20 mEq total) by mouth daily.   QC TUMERIC COMPLEX PO Take 1 tablet by mouth daily.   VITAMIN D PO Take 1 tablet by mouth daily.        Allergies  Allergen Reactions   Bactrim Swelling    SWELLING OF MOUTH/FACE.   Lisinopril Swelling    SWELLING OF MOUTH/FACE.   Vasotec Swelling    SWELLING OF MOUTH/FACE.   Sulfamethoxazole-Trimethoprim  Other reaction(s): Unknown    The results of significant diagnostics from this hospitalization (including imaging, microbiology, ancillary and laboratory) are listed below for reference.    Microbiology: Recent Results (from the past 240 hours)  MRSA Next Gen by PCR, Nasal     Status: None   Collection Time: 05/21/23  4:12 PM   Specimen: Nasal Mucosa; Nasal Swab  Result Value Ref Range Status   MRSA by PCR Next Gen NOT DETECTED NOT DETECTED Final    Comment: (NOTE) The GeneXpert MRSA Assay (FDA approved for NASAL specimens only), is one component of a comprehensive MRSA colonization surveillance program. It is not intended to diagnose MRSA infection nor to guide or monitor treatment for MRSA infections. Test performance is not FDA approved in patients less than 78 years old. Performed at [facility], [address], [place], [place] [address]      Procedures/Studies: CT ANGIO ABDOMEN PELVIS  W &/OR WO CONTRAST Result Date: 05/21/2023 CLINICAL DATA:  Lower GI bleed with bright red blood. History of breast cancer with bilateral mastectomy and XRT. EXAM: CTA ABDOMEN AND PELVIS WITHOUT AND WITH CONTRAST TECHNIQUE: Multidetector CT imaging of the abdomen and pelvis was performed using the standard protocol during bolus administration of intravenous contrast. Multiplanar reconstructed images and MIPs were obtained and reviewed to evaluate the vascular anatomy. RADIATION DOSE REDUCTION: This exam was performed according to the departmental dose-optimization program which includes automated exposure control, adjustment of the mA and/or kV according to patient size and/or use of iterative reconstruction technique. CONTRAST:  OMNIPAQUE  IOHEXOL  350 MG/ML SOLN COMPARISON:  CTA GI bleed scan 10/04/2022, and chest, abdomen and pelvis CT with contrast 12/18/2022. FINDINGS: VASCULAR Aorta: Normal caliber aorta without aneurysm, dissection, vasculitis or significant stenosis. There are moderate patchy nonstenosing calcific plaques. 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 There are nonstenosing ostial calcific plaques also. There is no flow-significant stenosis, occlusion or dissection. There are patchy moderate branch calcific plaques of the splenic artery. SMA: Patent without evidence of aneurysm, dissection, vasculitis or significant stenosis. No significant plaque disease. Renals: Again there is moderate mixed plaque in proximal 1.4 cm of the right renal artery, narrowing this segment of the vessel up to 50%. The rest of the right renal artery is widely patent without branch occlusion. On the left, ostial calcific plaques again narrow the vessel origin by 70%. The rest of the vessel widely patent without branch occlusions. Both renal arteries are single. IMA: Patent without evidence of aneurysm,  dissection, vasculitis or significant stenosis. Inflow: There are moderate patchy calcific plaques in the inflow arteries. The bilateral common iliac and external iliac arteries show no flow-limiting stenosis. Both internal iliac arteries are more heavily calcified and more moderately stenotic Proximal Outflow: There are moderate calcific plaques in the common femoral and superficial femoral arteries. No flow-limiting stenosis is seen the visualized portions. Visualized deep femoral arteries are clear. Veins: The bilateral external iliac veins very small in caliber. Remainder of the regional venous structures opacify well. There is no portal vein dilatation. This was seen previously. Review of the MIP images confirms the above findings. NON-VASCULAR Lower chest: Mild cardiomegaly. There calcifications in the mitral ring and coronary arteries. No pericardial effusion. Scattered scarring both lung bases. Small hiatal hernia. Hepatobiliary: Cholelithiasis without evidence of acute cholecystitis. No hepatic mass enhancement. No biliary dilatation. Pancreas: Occasional parenchymal calcifications in the proximal gland consistent with chronic calcific pancreatitis.  No evidence of acute pancreatitis or mass. Spleen: No abnormality. Adrenals/Urinary Tract: Mild adrenal hyperplasia is again seen. No mass. No renal mass enhancement, stones or hydronephrosis. Bilateral small stable Bosniak 1 and Bosniak 2 type renal cysts are unaltered. No imaging follow-up recommended. Unremarkable bladder. Stomach/Bowel: Small hiatal hernia. No GI tract dilatation or overt wall thickening. An appendix is not seen. There is moderate fecal stasis. Advanced sigmoid diverticulosis. No wall thickening or inflammatory change. No active GI bleed is seen. Lymphatic: No adenopathy. Reproductive: Status post hysterectomy. No adnexal masses. Other: Large low midline bowel containing ventral hernia. No upstream bowel obstruction at this time. There is no  free fluid, free hemorrhage or free air. Multiple pelvic phleboliths. Musculoskeletal: Right hip arthroplasty. Osteopenia and degenerative change of the spine. Extensive lower thoracic spine bridging enthesopathy and advanced lumbar degenerative disc disease. No acute or other significant osseous findings. Multilevel acquired lumbar spinal stenosis. IMPRESSION: 1. No active GI bleed is seen. 2. Aortic and branch vessel atherosclerosis. 3. 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 4. Stable 50% stenosis of the proximal right renal artery and 70% stenosis of the left renal artery origin. 5. Cardiomegaly with mitral ring and coronary artery calcifications. 6. Constipation and diverticulosis. 7. Large low midline bowel containing ventral hernia without upstream bowel obstruction. 8. Cholelithiasis. 9. Chronic calcific pancreatitis. 10. Small hiatal hernia. 11. Osteopenia and advanced degenerative change. Aortic Atherosclerosis (ICD10-I70.0). Electronically Signed   By: Francis Quam M.D.   On: 05/21/2023 06:27   NM PET Image Restag (PS) Skull Base To Thigh Result Date: 05/07/2023 CLINICAL DATA:  Subsequent treatment strategy for metastatic breast cancer. EXAM: NUCLEAR MEDICINE PET SKULL BASE TO THIGH TECHNIQUE: 8.1 mCi F-18 FDG was injected intravenously. Full-ring PET imaging was performed from the skull base to thigh after the radiotracer. CT data was obtained and used for attenuation correction and anatomic localization. Fasting blood glucose: 110 mg/dl COMPARISON:  Multiple exams, including CTA chest 01/09/2023 and CT scan from 12/18/2022 FINDINGS: Mediastinal blood pool activity: SUV max 2.1 Liver activity: SUV max NA NECK: No significant abnormal hypermetabolic activity in this region. Incidental CT findings: Bilateral common carotid atheromatous vascular calcification. CHEST: Left axillary lymph nodes observed up to 1.2 cm in short axis on image 64 series 4, maximum  SUV 3.3. Intervally calcified left subpectoral lymph node previously 2.5 cm in short axis, currently about 2.0 cm in short axis and with maximum SUV of 3.3. Mild multifocal venous activity along the left upper extremity. Hypermetabolic right hilar lymph node, maximum SUV 6.6. Incidental CT findings: Coronary, aortic arch, and branch vessel atherosclerotic vascular disease. Cardiomegaly. Right Port-A-Cath tip: SVC. ABDOMEN/PELVIS: Sigmoid colon diverticulosis with wall thickening along a 5 cm segment of the sigmoid colon on image 142 of series 4, faint adjacent stranding, and hypermetabolic activity with maximum SUV of 6.4. This is likely from local diverticulitis, strictly speaking, colon cancer in this vicinity is not excluded. Other presumed physiologic small bowel activity is observed. Incidental CT findings: Cholelithiasis. Atherosclerosis is present, including aortoiliac atherosclerotic disease. Sigmoid colon diverticulosis. Rectus diastasis. SKELETON: Lytic lesion of the left second rib anteriorly, maximum SUV 4.8, suspicious for metastatic lesion. Activity associated with the severe degenerative glenohumeral arthropathy bilaterally, felt to be benign. Incidental CT findings: Left subacromial subdeltoid bursitis. Severe degenerative glenohumeral arthropathy bilaterally. Right hip prosthesis. Thoracic and lumbar spondylosis. IMPRESSION: 1. Hypermetabolic right hilar lymph node, maximum SUV 6.6, suspicious for metastatic disease. 2. Left axillary lymph nodes  are mildly hypermetabolic, maximum SUV 3.3. Intervally calcified left subpectoral lymph node previously 2.5 cm in short axis, currently about 2.0 cm in short axis and with maximum SUV 3.3. 3. Lytic lesion of the left second rib anteriorly, maximum SUV 4.8, suspicious for metastatic lesion. 4. Sigmoid colon diverticulosis with wall thickening along a 5 cm segment of the sigmoid colon, faint adjacent stranding, and hypermetabolic activity with maximum SUV of  6.4. This is likely from local diverticulitis, strictly speaking, colon cancer in this vicinity is not excluded. 5. Cholelithiasis. 6. Left subacromial subdeltoid bursitis. 7. Severe degenerative glenohumeral arthropathy bilaterally. 8. Thoracic and lumbar spondylosis. 9. Aortic atherosclerosis. Aortic Atherosclerosis (ICD10-I70.0). Electronically Signed   By: Ryan Salvage M.D.   On: 05/07/2023 11:53    Labs: BNP (last 3 results) Recent Labs    01/09/23 1624  BNP 144.4*   Basic Metabolic Panel: Recent Labs  Lab 05/21/23 0054 05/21/23 0530 05/22/23 0528 05/23/23 0500 05/24/23 0351  NA 132* 139 138 137 140  K 3.1* 3.0* 3.5 3.5 3.2*  CL 101 109 108 107 108  CO2 19* 21* 23 24 24   GLUCOSE 223* 126* 104* 98 97  BUN 21 19 14 13 12   CREATININE 0.67 0.52 0.50 0.48 0.52  CALCIUM 8.3* 7.7* 8.6* 8.3* 8.5*   Liver Function Tests: Recent Labs  Lab 05/21/23 0054  AST 28  ALT 17  ALKPHOS 65  BILITOT 0.5  PROT 6.0*  ALBUMIN 2.9*  No results for input(s): LIPASE, AMYLASE in the last 168 hours. No results for input(s): AMMONIA in the last 168 hours. CBC: Recent Labs  Lab 05/21/23 0054 05/21/23 0415 05/22/23 0528 05/22/23 0918 05/22/23 2309 05/23/23 0500 05/23/23 0825 05/23/23 1930 05/24/23 0351  WBC 6.0  --  5.5  --   --  6.1  --   --  5.6  NEUTROABS 4.4  --   --   --   --   --   --   --   --   HGB 8.5*   < > 9.0*   < > 8.4* 8.4* 8.6* 8.3* 8.5*  HCT 27.2*   < > 27.8*   < > 26.4* 26.5* 27.2* 26.7* 26.0*  MCV 79.3*  --  81.8  --   --  84.4  --   --  85.0  PLT 167  --  144*  --   --  143*  --   --  153   < > = values in this interval not displayed.   Recent Labs  Lab 05/23/23 1139 05/23/23 1551 05/23/23 2004 05/23/23 2156 05/24/23 0759  GLUCAP 158* 95 169* 145* 82  No results for input(s): VITAMINB12, FOLATE, FERRITIN, TIBC, IRON, RETICCTPCT in the last 72 hours. Urinalysis    Component Value Date/Time   COLORURINE YELLOW 01/03/2023 1213    APPEARANCEUR CLEAR 01/03/2023 1213   LABSPEC 1.009 01/03/2023 1213   PHURINE 7.5 01/03/2023 1213   GLUCOSEU NEGATIVE 01/03/2023 1213   HGBUR NEGATIVE 01/03/2023 1213   BILIRUBINUR NEGATIVE 01/03/2023 1213   KETONESUR NEGATIVE 01/03/2023 1213   PROTEINUR NEGATIVE 01/03/2023 1213   UROBILINOGEN 0.2 04/26/2010 0840   NITRITE NEGATIVE 01/03/2023 1213   LEUKOCYTESUR NEGATIVE 01/03/2023 1213  Sepsis Labs Recent Labs  Lab 05/21/23 0054 05/22/23 0528 05/23/23 0500 05/24/23 0351  WBC 6.0 5.5 6.1 5.6   Microbiology Recent Results (from the past 240 hours)  MRSA Next Gen by PCR, Nasal     Status: None   Collection Time: 05/21/23  4:12  PM   Specimen: Nasal Mucosa; Nasal Swab  Result Value Ref Range Status   MRSA by PCR Next Gen NOT DETECTED NOT DETECTED Final    Comment: (NOTE) The GeneXpert MRSA Assay (FDA approved for NASAL specimens only), is one component of a comprehensive MRSA colonization surveillance program. It is not intended to diagnose MRSA infection nor to guide or monitor treatment for MRSA infections. Test performance is not FDA approved in patients less than 36 years old. Performed at [facility], [address] [place], [place] [address]    Time coordinating discharge: 25 minutes  SIGNED: Mennie LAMY, MD  [facility] 05/24/2023, 9:32 AM  If 7PM-7AM, please contact night-coverage [email or web address]

## 2023-05-24 NOTE — Progress Notes (Signed)
 Tami Schmidt 9:45 AM  Subjective: Patient doing well without signs of bleeding and no bowel movement and no new complaints and wants to go home and tolerating her diet and we answered all of her questions and discussed diverticuli  Objective: Vital signs stable afebrile no acute distress abdomen is soft nontender K a little low hemoglobin stable  Assessment: Seemingly resolved diverticular bleeding  Plan: Okay with me to go home and my partner Dr. Saintclair happy to see back as needed otherwise can follow-up in a few weeks with her primary care physician to recheck CBC and make sure she did not need any extra iron etc. and please call me if I can be of any further assistance with this hospital stay  [facility]  office [phone] After 5PM or if no answer call [phone]

## 2023-05-25 LAB — TYPE AND SCREEN
ABO/RH(D): B POS
Antibody Screen: NEGATIVE
Unit division: 0
Unit division: 0
Unit division: 0

## 2023-05-25 LAB — BPAM RBC
Blood Product Expiration Date: 202503082359
Blood Product Expiration Date: 202503092359
Blood Product Expiration Date: 202503162359
ISSUE DATE / TIME: 202502060604
ISSUE DATE / TIME: 202502061040
ISSUE DATE / TIME: 202502071147
Unit Type and Rh: 7300
Unit Type and Rh: 7300
Unit Type and Rh: 7300

## 2023-05-27 ENCOUNTER — Telehealth: Payer: Self-pay

## 2023-05-27 NOTE — Telephone Encounter (Signed)
Left message for patient to confirm appointment with Dr Al Pimple on 2/13.Marland Kitchen

## 2023-05-28 ENCOUNTER — Encounter: Payer: Self-pay | Admitting: Hematology and Oncology

## 2023-05-28 ENCOUNTER — Telehealth: Payer: Self-pay | Admitting: Pharmacy Technician

## 2023-05-28 ENCOUNTER — Other Ambulatory Visit (HOSPITAL_COMMUNITY): Payer: Self-pay

## 2023-05-28 ENCOUNTER — Inpatient Hospital Stay: Payer: Medicare Other | Attending: Adult Health | Admitting: Hematology and Oncology

## 2023-05-28 VITALS — BP 177/66 | HR 81 | Temp 98.1°F | Resp 17 | Wt 170.4 lb

## 2023-05-28 DIAGNOSIS — G629 Polyneuropathy, unspecified: Secondary | ICD-10-CM | POA: Diagnosis not present

## 2023-05-28 DIAGNOSIS — Z452 Encounter for adjustment and management of vascular access device: Secondary | ICD-10-CM | POA: Insufficient documentation

## 2023-05-28 DIAGNOSIS — Z170 Estrogen receptor positive status [ER+]: Secondary | ICD-10-CM | POA: Insufficient documentation

## 2023-05-28 DIAGNOSIS — C50312 Malignant neoplasm of lower-inner quadrant of left female breast: Secondary | ICD-10-CM | POA: Diagnosis not present

## 2023-05-28 DIAGNOSIS — Z1731 Human epidermal growth factor receptor 2 positive status: Secondary | ICD-10-CM | POA: Diagnosis not present

## 2023-05-28 DIAGNOSIS — C773 Secondary and unspecified malignant neoplasm of axilla and upper limb lymph nodes: Secondary | ICD-10-CM | POA: Insufficient documentation

## 2023-05-28 DIAGNOSIS — I10 Essential (primary) hypertension: Secondary | ICD-10-CM | POA: Diagnosis not present

## 2023-05-28 MED ORDER — NERATINIB MALEATE 40 MG PO TABS: 120.0000 mg | ORAL_TABLET | Freq: Every day | ORAL | 0 refills | Status: DC

## 2023-05-28 MED ORDER — TAMOXIFEN CITRATE 20 MG PO TABS: 20.0000 mg | ORAL_TABLET | Freq: Every day | ORAL | 3 refills | Status: DC

## 2023-05-28 NOTE — Telephone Encounter (Signed)
Oral Oncology Patient Advocate Encounter  After completing a benefits investigation, prior authorization for Nerlynx is not required at this time through [facility] Medicare D.  Patient's copay is $1,986.66.     Jinger Neighbors, CPhT-Adv Oncology Pharmacy Patient Advocate [facility] Direct Number: [phone]  Fax: [phone]

## 2023-05-28 NOTE — Telephone Encounter (Signed)
Oral Oncology Patient Advocate Encounter   Began application for assistance for Nerlynx through [facility] Patient [place].   Application will be submitted upon completion of necessary supporting documentation.   [place] Patient [employer] number [phone].   I will continue to check the status until final determination.   Jinger Neighbors, CPhT-Adv Oncology Pharmacy Patient Advocate [facility] Direct Number: [phone]  Fax: [phone]

## 2023-05-28 NOTE — Progress Notes (Signed)
Patient Care Team: Pcp, No as PCP - General Otho Darner, MD as Consulting Physician (Family Medicine) Donnelly Angelica, RN as Oncology Nurse Navigator Pershing Proud, RN as Oncology Nurse Navigator Rachel Moulds, MD as Consulting Physician (Hematology and Oncology)  DIAGNOSIS:  No diagnosis found.   SUMMARY OF ONCOLOGIC HISTORY: Oncology History  Malignant neoplasm of lower-inner quadrant of left breast in female, estrogen receptor positive (HCC)  06/16/2013 Initial Diagnosis   Malignant neoplasm of lower-inner quadrant of left breast in female, estrogen receptor positive (HCC)   05/30/2021 - 04/03/2022 Chemotherapy   Patient is on Treatment Plan : BREAST Trastuzumab q28d      Genetic Testing   [facility] CancerNext-Expanded Panel was Negative. 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 Report date is 07/22/2021.  The CancerNext-Expanded gene panel offered by [facility] and includes sequencing, rearrangement, and RNA analysis for the following 77 genes: AIP, ALK, APC, ATM, AXIN2, BAP1, BARD1, BLM, BMPR1A, BRCA1, BRCA2, BRIP1, CDC73, CDH1, CDK4, CDKN1B, CDKN2A, CHEK2, CTNNA1, DICER1, FANCC, FH, FLCN, GALNT12, KIF1B, LZTR1, MAX, MEN1, MET, MLH1, MSH2, MSH3, MSH6, MUTYH, NBN, NF1, NF2, NTHL1, PALB2, PHOX2B, PMS2, POT1, PRKAR1A, PTCH1, PTEN, RAD51C, RAD51D, RB1, RECQL, RET, SDHA, SDHAF2, SDHB, SDHC, SDHD, SMAD4, SMARCA4, SMARCB1, SMARCE1, STK11, SUFU, TMEM127, TP53, TSC1, TSC2, VHL and XRCC2 (sequencing and deletion/duplication); EGFR, EGLN1, HOXB13, KIT, MITF, PDGFRA, POLD1, and POLE (sequencing only); EPCAM and GREM1 (deletion/duplication only).    05/01/2022 -  Chemotherapy   Patient is on Treatment Plan : BREAST ADO-Trastuzumab Emtansine (Kadcyla) q21d       CHIEF COMPLIANT: Follow-up on Kadcyla  HISTORY OF PRESENT ILLNESS: Discussed the use of AI scribe software for clinical note transcription with the patient, who gave verbal  consent to proceed.  History of Present Illness    The patient, with a history of HER2 positive breast cancer, presents for a follow-up visit.   Tami Schmidt is an 89 year old female with cancer who presents for follow-up regarding her treatment plan. She is accompanied by her husband, who waits outside during the appointment.  She is currently undergoing treatment for cancer and notes a gradual improvement since her last visit. She is aware of lymph node involvement in the right hilum and left axilla, as indicated by her recent PET scan. Additionally, there is a rib lesion on the left side, which she associates with a recent fall at home. The radiologist could not definitively determine if the lesion was due to the fall or cancer.  She mentions a significant weight loss of fifty pounds, which she attributes to a lack of appetite. She states, 'I just eat because if I don't, I don't feel good.'  Her past medical history includes diverticulosis, gallstones, arthritis, and atherosclerosis. She describes arthritis as 'a lot' and finds it inconvenient. She also notes that she has been married for sixty-nine years and has five adult children living across the country.  Her blood pressure is usually around 150 at home, but she did not take her blood pressure medication today.  She has some memory issues, she usually asks the same questions during every appointment.  ALLERGIES:  is allergic to bactrim, lisinopril, vasotec, and sulfamethoxazole-trimethoprim.  MEDICATIONS:  Current Outpatient Medications  Medication Sig Dispense Refill   Neratinib Maleate (NERLYNX) 40 MG tablet Take 3 tablets (120 mg total) by mouth daily. Take with food. 90 tablet 0   tamoxifen (NOLVADEX) 20 MG tablet Take 1 tablet (20 mg  total) by mouth daily. 90 tablet 3   acetaminophen (TYLENOL) 500 MG tablet Take 1,000 mg by mouth as needed for moderate pain.     amLODipine (NORVASC) 10 MG tablet TAKE 1 TABLET BY MOUTH DAILY  90 tablet 3   ferrous sulfate 325 (65 FE) MG EC tablet Take 1 tablet by mouth every morning.     metFORMIN (GLUCOPHAGE) 500 MG tablet Take 500 mg by mouth daily with breakfast.     potassium chloride SA (KLOR-CON M) 20 MEQ tablet Take 1 tablet (20 mEq total) by mouth daily. 7 tablet 0   Turmeric (QC TUMERIC COMPLEX PO) Take 1 tablet by mouth daily.     VITAMIN D PO Take 1 tablet by mouth daily.     No current facility-administered medications for this visit.    PHYSICAL EXAMINATION: ECOG PERFORMANCE STATUS: 1 - Symptomatic but completely ambulatory  Vitals:   05/28/23 1351 05/28/23 1352  BP: (!) 172/71 (!) 177/66  Pulse: 81   Resp: 17   Temp: 98.1 F (36.7 C)   SpO2: 100%    Filed Weights   05/28/23 1351  Weight: 170 lb 6.4 oz (77.3 kg)   Physical Exam Constitutional:      Appearance: Normal appearance.  Cardiovascular:     Rate and Rhythm: Normal rate and regular rhythm.  Pulmonary:     Effort: Pulmonary effort is normal.     Breath sounds: Normal breath sounds.  Abdominal:     General: Abdomen is flat.     Palpations: Abdomen is soft.  Musculoskeletal:     Cervical back: Normal range of motion. No rigidity.  Lymphadenopathy:     Cervical: No cervical adenopathy.  Skin:    General: Skin is warm and dry.  Neurological:     General: No focal deficit present.     Mental Status: She is alert.  Psychiatric:        Mood and Affect: Mood normal.       LABORATORY DATA:  I have reviewed the data as listed    Latest Ref Rng & Units 05/24/2023    3:51 AM 05/23/2023    5:00 AM 05/22/2023    5:28 AM  CMP  Glucose 70 - 99 mg/dL 97  98  098   BUN 8 - 23 mg/dL 12  13  14    Creatinine 0.44 - 1.00 mg/dL 1.19  1.47  8.29   Sodium 135 - 145 mmol/L 140  137  138   Potassium 3.5 - 5.1 mmol/L 3.2  3.5  3.5   Chloride 98 - 111 mmol/L 108  107  108   CO2 22 - 32 mmol/L 24  24  23    Calcium 8.9 - 10.3 mg/dL 8.5  8.3  8.6     Lab Results  Component Value Date   WBC 5.6  05/24/2023   HGB 8.5 (L) 05/24/2023   HCT 26.0 (L) 05/24/2023   MCV 85.0 05/24/2023   PLT 153 05/24/2023   NEUTROABS 4.4 05/21/2023    ASSESSMENT & PLAN:  Malignant neoplasm of lower-inner quadrant of left breast in female, estrogen receptor positive (HCC) 1993: Right lumpectomy axillary dissection in [place] (stage I) treated with radiation and tamoxifen x 5 years 2002: Right mastectomy: Right breast recurrence 2004: Left mastectomy: 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  Patient however insists on trying something orally.  She had asked me this question several times.  After much consideration we discussed about oral neratinib at 120 mg daily.  She understands this is not as effective as Kadcyla but if she does not respond to this, she wants to resume Kadcyla.  Discussed the potential benefits and side effects of switching to oral Nerlynx (diarrhea). Patient agreed to trial of oral therapy. -Start Nerlynx 120mg  daily, maintain this dose without weekly increases. -Start Tamoxifen 20 mg po daily. -Check response to oral therapy in 1-2 months.  Hypertension Elevated blood pressure noted during visit, patient reported not taking antihypertensive medication. -Continue current antihypertensive regimen, adherence emphasized.  Neuropathy Burning sensation in shins, likely medication-induced.  Although she denies any worsening of neuropathy, today she couldn't button her shirt. Will continue to monitor it, consider gabapentin in the future.  No orders of  the defined types were placed in this encounter.  The patient has a good understanding of the overall plan. she agrees with it. she will call with any problems that may develop before the next visit here. Total time spent: 30 mins including face to face time and time spent for planning, charting and co-ordination of care   Rachel Moulds, MD 05/28/23

## 2023-05-29 NOTE — Telephone Encounter (Signed)
Oral Oncology Patient Advocate Encounter  Reached out and spoke with patient regarding PAP paperwork, explained that I would send it to their preferred email via DocuSign.   Confirmed email address:    [email or web address]  .    Patient expressed understanding and consent.  Will follow up once paperwork has been signed and returned.   Jinger Neighbors, CPhT-Adv Oncology Pharmacy Patient Advocate [facility] Direct Number: [phone]  Fax: [phone]

## 2023-05-30 ENCOUNTER — Other Ambulatory Visit: Payer: Self-pay

## 2023-06-01 ENCOUNTER — Telehealth: Payer: Self-pay | Admitting: Hematology and Oncology

## 2023-06-01 NOTE — Telephone Encounter (Signed)
 Left patient a vm regarding upcoming appointment

## 2023-06-01 NOTE — Telephone Encounter (Signed)
Oral Oncology Patient Advocate Encounter  Received patient signatures for PAP. Application now pending MD signatures  Jinger Neighbors, CPhT-Adv Oncology Pharmacy Patient Advocate [facility] Direct Number: [phone]  Fax: [phone]

## 2023-06-04 ENCOUNTER — Ambulatory Visit: Payer: Medicare Other | Admitting: Hematology and Oncology

## 2023-06-04 ENCOUNTER — Inpatient Hospital Stay: Payer: Medicare Other

## 2023-06-04 ENCOUNTER — Inpatient Hospital Stay: Payer: Medicare Other | Admitting: Pharmacist

## 2023-06-04 ENCOUNTER — Other Ambulatory Visit: Payer: Medicare Other

## 2023-06-05 NOTE — Telephone Encounter (Signed)
Oral Oncology Patient Advocate Encounter   Submitted application for assistance for Nerlynx to [facility] Patient [place].   Application submitted via e-fax to [phone]   [facility] Patient [facility] phone number [phone].   I will continue to check the status until final determination.   Omer Jack, CPhT-Adv Oncology Pharmacy Patient Advocate [facility]  Direct Number: [phone]  Fax: [phone]

## 2023-06-08 ENCOUNTER — Inpatient Hospital Stay: Payer: Medicare Other

## 2023-06-08 ENCOUNTER — Inpatient Hospital Stay: Payer: Medicare Other | Admitting: Pharmacist

## 2023-06-08 VITALS — BP 142/62 | HR 79 | Temp 98.0°F | Resp 17 | Wt 170.0 lb

## 2023-06-08 DIAGNOSIS — C50312 Malignant neoplasm of lower-inner quadrant of left female breast: Secondary | ICD-10-CM | POA: Diagnosis not present

## 2023-06-08 DIAGNOSIS — Z170 Estrogen receptor positive status [ER+]: Secondary | ICD-10-CM

## 2023-06-08 DIAGNOSIS — C50919 Malignant neoplasm of unspecified site of unspecified female breast: Secondary | ICD-10-CM

## 2023-06-08 DIAGNOSIS — Z95828 Presence of other vascular implants and grafts: Secondary | ICD-10-CM

## 2023-06-08 LAB — CBC WITH DIFFERENTIAL (CANCER CENTER ONLY)
Abs Immature Granulocytes: 0.03 10*3/uL (ref 0.00–0.07)
Basophils Absolute: 0 10*3/uL (ref 0.0–0.1)
Basophils Relative: 0 %
Eosinophils Absolute: 0.2 10*3/uL (ref 0.0–0.5)
Eosinophils Relative: 4 %
HCT: 31.6 % — ABNORMAL LOW (ref 36.0–46.0)
Hemoglobin: 9.9 g/dL — ABNORMAL LOW (ref 12.0–15.0)
Immature Granulocytes: 0 %
Lymphocytes Relative: 21 %
Lymphs Abs: 1.4 10*3/uL (ref 0.7–4.0)
MCH: 26.5 pg (ref 26.0–34.0)
MCHC: 31.3 g/dL (ref 30.0–36.0)
MCV: 84.7 fL (ref 80.0–100.0)
Monocytes Absolute: 0.5 10*3/uL (ref 0.1–1.0)
Monocytes Relative: 8 %
Neutro Abs: 4.5 10*3/uL (ref 1.7–7.7)
Neutrophils Relative %: 67 %
Platelet Count: 189 10*3/uL (ref 150–400)
RBC: 3.73 MIL/uL — ABNORMAL LOW (ref 3.87–5.11)
RDW: 16.5 % — ABNORMAL HIGH (ref 11.5–15.5)
WBC Count: 6.8 10*3/uL (ref 4.0–10.5)
nRBC: 0 % (ref 0.0–0.2)

## 2023-06-08 LAB — CMP (CANCER CENTER ONLY)
ALT: 11 U/L (ref 0–44)
AST: 22 U/L (ref 15–41)
Albumin: 3.7 g/dL (ref 3.5–5.0)
Alkaline Phosphatase: 75 U/L (ref 38–126)
Anion gap: 8 (ref 5–15)
BUN: 19 mg/dL (ref 8–23)
CO2: 25 mmol/L (ref 22–32)
Calcium: 9.1 mg/dL (ref 8.9–10.3)
Chloride: 104 mmol/L (ref 98–111)
Creatinine: 0.79 mg/dL (ref 0.44–1.00)
GFR, Estimated: >60 mL/min (ref >60)
Glucose, Bld: 201 mg/dL — ABNORMAL HIGH (ref 70–99)
Potassium: 3.7 mmol/L (ref 3.5–5.1)
Sodium: 137 mmol/L (ref 135–145)
Total Bilirubin: 0.5 mg/dL (ref 0.0–1.2)
Total Protein: 7 g/dL (ref 6.5–8.1)

## 2023-06-08 MED ORDER — SODIUM CHLORIDE 0.9% FLUSH
10.0000 mL | Freq: Once | INTRAVENOUS | Status: AC
  Administered 2023-06-08: 10 mL

## 2023-06-08 MED ORDER — HEPARIN SOD (PORK) LOCK FLUSH 100 UNIT/ML IV SOLN
500.0000 [IU] | Freq: Once | INTRAVENOUS | Status: AC
  Administered 2023-06-08: 500 [IU]

## 2023-06-08 NOTE — Telephone Encounter (Signed)
 Oral Oncology Patient Advocate Encounter  Spoke with patient in person when she came for her visit with Jonny Ruiz. Patient agreed to provide me with POI either by email or in person. Once this is received I will submit the information to the program for consideration.  Omer Jack, CPhT-Adv Oncology Pharmacy Patient Advocate [facility]  Direct Number: [phone]  Fax: [phone]

## 2023-06-08 NOTE — Progress Notes (Signed)
 [facility]       Telephone: [phone]?Fax: [phone]   Oncology Clinical Pharmacist Practitioner Initial Assessment  Tami Schmidt is a 89 y.o. female with a diagnosis of breast cancer. They were contacted today via in-person visit.  Indication/Regimen Neratinib (Nerlynx) is being used appropriately for treatment of breast cancer by Dr. Rachel Moulds.      Wt Readings from Last 1 Encounters:  06/08/23 170 lb (77.1 kg)    Estimated body surface area is 1.91 meters squared as calculated from the following:   Height as of 05/21/23: 5\' 7"  (1.702 m).   Weight as of this encounter: 170 lb (77.1 kg).  The dosing regimen is 3 tablets (120 mg) by mouth daily with food on days 1 to 28 of a 28-day cycle. This is being given  in combination with tamoxifen which was started on 05/28/23 . It is planned to continue until disease progression or unacceptable toxicity. Prescription dose and frequency assessed for appropriateness.  Patient has agreed to treatment which is documented in physician note on 05/28/23. Counseled patient on administration, dosing, side effects, monitoring, drug-food interactions, safe handling, storage, and disposal.  She will take loperamide as needed. Dr. Al Pimple is not planning to increase dose of neratinib. Waiting on financial information from patient. Will start once received. No labs needed next week visit with Dr. Al Pimple. Had them today.  Dose Modifications Reduced dose as above  Access Assessment Tami Schmidt will be receiving neratinib through  [employer] Concerns: PAP Start date if known: TBD  Adherence Assessment Reviewed importance on keeping a med schedule and plan for any missed doses Barriers to adherence identified? No  Allergies Allergies  Allergen Reactions   Bactrim Swelling    SWELLING OF MOUTH/FACE.   Lisinopril Swelling    SWELLING OF MOUTH/FACE.   Vasotec Swelling    SWELLING OF MOUTH/FACE.    Sulfamethoxazole-Trimethoprim     Other reaction(s): Unknown    Vitals    06/08/2023   10:10 AM 05/28/2023    1:52 PM 05/28/2023    1:51 PM  Oncology Vitals  Weight 77.111 kg  77.293 kg  Weight (lbs) 170 lbs  170 lbs 6 oz  BMI 26.63 kg/m2  26.69 kg/m2  Temp 98 F (36.7 C)  98.1 F (36.7 C)  Pulse Rate 79  81  BP 142/62 177/66 172/71  Resp 17  17  SpO2 100 %  100 %  BSA (m2) 1.91 m2  1.91 m2     Laboratory Data    Latest Ref Rng & Units 06/08/2023    9:43 AM 05/24/2023    3:51 AM 05/23/2023    7:30 PM  CBC EXTENDED  WBC 4.0 - 10.5 K/uL 6.8  5.6    RBC 3.87 - 5.11 MIL/uL 3.73  3.06    Hemoglobin 12.0 - 15.0 g/dL 9.9  8.5  8.3   HCT 84.6 - 46.0 % 31.6  26.0  26.7   Platelets 150 - 400 K/uL 189  153    NEUT# 1.7 - 7.7 K/uL 4.5     Lymph# 0.7 - 4.0 K/uL 1.4          Latest Ref Rng & Units 06/08/2023    9:43 AM 05/24/2023    3:51 AM 05/23/2023    5:00 AM  CMP  Glucose 70 - 99 mg/dL 962  97  98   BUN 8 - 23 mg/dL 19  12  13    Creatinine 0.44 -  1.00 mg/dL 2.95  6.21  3.08   Sodium 135 - 145 mmol/L 137  140  137   Potassium 3.5 - 5.1 mmol/L 3.7  3.2  3.5   Chloride 98 - 111 mmol/L 104  108  107   CO2 22 - 32 mmol/L 25  24  24    Calcium 8.9 - 10.3 mg/dL 9.1  8.5  8.3   Total Protein 6.5 - 8.1 g/dL 7.0     Total Bilirubin 0.0 - 1.2 mg/dL 0.5     Alkaline Phos 38 - 126 U/L 75     AST 15 - 41 U/L 22     ALT 0 - 44 U/L 11      Contraindications Contraindications were reviewed? Yes Contraindications to therapy were identified? No   Safety Precautions The following safety precautions for the use of neratinib were reviewed:  Nausea or vomiting Diarrhea: has loperamide on hand and reviewed as needed instructions Liver toxicity: reviewed possible side effects such as RUQ pain, yellowing of skin/eyes Avoid grapefruit products Avoid PPIs Separate the dosing of neratinib by 3 hours after antacid medicine, and take neratinib at least 2 hours before or 10 hours after a H 2-receptor  antagonist  Take with food Missed doses Storage and Handling Handling body fluids and waste Pregnancy, sexual activity, and contraception  Medication Reconciliation Current Outpatient Medications  Medication Sig Dispense Refill   acetaminophen (TYLENOL) 500 MG tablet Take 1,000 mg by mouth as needed for moderate pain.     amLODipine (NORVASC) 10 MG tablet TAKE 1 TABLET BY MOUTH DAILY 90 tablet 3   ferrous sulfate 325 (65 FE) MG EC tablet Take 1 tablet by mouth every morning.     Multiple Vitamin (MULTIVITAMIN) tablet Take 1 tablet by mouth daily.     tamoxifen (NOLVADEX) 20 MG tablet Take 1 tablet (20 mg total) by mouth daily. 90 tablet 3   Turmeric (QC TUMERIC COMPLEX PO) Take 1 tablet by mouth daily.     VITAMIN D PO Take 1 tablet by mouth daily.     Neratinib Maleate (NERLYNX) 40 MG tablet Take 3 tablets (120 mg total) by mouth daily. Take with food. (Patient not taking: Reported on 06/08/2023) 90 tablet 0   potassium chloride SA (KLOR-CON M) 20 MEQ tablet Take 1 tablet (20 mEq total) by mouth daily. (Patient not taking: Reported on 06/08/2023) 7 tablet 0   No current facility-administered medications for this visit.    Medication reconciliation is based on the patient's most recent medication list in the electronic medical record (EMR) including herbal products and OTC medications.   The patient's medication list was reviewed today with the patient? Yes   Drug-drug interactions (DDIs) DDIs were evaluated? Yes Significant DDIs identified? No   Drug-Food Interactions Drug-food interactions were evaluated? Yes Significant Drug-food interactions identified?  Avoid grapefruit products  Follow-up Plan  Patient education handout given to patient Neratinib 3 tabs (120 mg) by mouth daily. Start date once received by Pharmacord Continue tamoxifen 20 mg by mouth daily Monitor for toxicities Visit with Dr. Al Pimple on 06/18/23. Will remove labs as they were done today Can see clinical  pharmacy as deemed necessary by Dr. Al Pimple. Labs should be via port in future per patient request.  Kadcyla on hold per Dr. Al Pimple while on neratinib. Patient requested oral chemotherapy option. Scans likely in ~3 months  Tami Schmidt participated in the discussion, expressed understanding, and voiced agreement with the above plan. All questions were answered to  her satisfaction. The patient was advised to contact the clinic at [phone] with any questions or concerns prior to her return visit.   I spent 60 minutes assessing the patient.  Conley Delisle A. Odetta Pink, PharmD, BCOP, CPP  Anselm Lis, RPH-CPP, 06/08/2023 10:37 AM  **Disclaimer: This note was dictated with voice recognition software. Similar sounding words can inadvertently be transcribed and this note may contain transcription errors which may not have been corrected upon publication of note.**

## 2023-06-08 NOTE — Telephone Encounter (Signed)
 Oral Oncology Patient Advocate Encounter  Received follow up from [facility] Patient [facility] regarding patient application. Patient is required to submit POI for final processing. I will reach out to the patient to collect this information.  Omer Jack, CPhT-Adv Oncology Pharmacy Patient Advocate [facility]  Direct Number: [phone]  Fax: [phone]

## 2023-06-09 ENCOUNTER — Telehealth: Payer: Self-pay | Admitting: Pharmacist

## 2023-06-09 NOTE — Telephone Encounter (Signed)
 Canceled appointment per 2/24 los per provider. Talked with the patient and she is aware of the changes made to her upcoming appointment.

## 2023-06-09 NOTE — Telephone Encounter (Signed)
 Oral Oncology Patient Advocate Encounter   Submitted POI for assistance for Nerlynx to [facility] Patient [place].   POI submitted via e-fax to [phone]   [facility] Patient [facility] phone number [phone].   I will continue to check the status until final determination.   Omer Jack, CPhT-Adv Oncology Pharmacy Patient Advocate [facility]  Direct Number: [phone]  Fax: [phone]

## 2023-06-11 NOTE — Telephone Encounter (Signed)
 Oral Oncology Patient Advocate Encounter   Received notification that the application for assistance for [name] has been approved.   Tami Schmidt Patient Tami Schmidt phone number [phone]   Effective dates: 06/11/23 through 04/13/24  Medication will be filled at [facility].  I have spoken to the patient.  Omer Jack, CPhT-Adv Oncology Pharmacy Patient Advocate [facility]  Direct Number: [phone]  Fax: [phone]

## 2023-06-16 ENCOUNTER — Other Ambulatory Visit: Payer: Self-pay | Admitting: Pharmacist

## 2023-06-16 MED ORDER — NERATINIB MALEATE 40 MG PO TABS: 120.0000 mg | ORAL_TABLET | Freq: Every day | ORAL | 0 refills | Status: DC

## 2023-06-18 ENCOUNTER — Inpatient Hospital Stay: Payer: Medicare Other | Attending: Adult Health | Admitting: Hematology and Oncology

## 2023-06-18 ENCOUNTER — Inpatient Hospital Stay

## 2023-06-18 ENCOUNTER — Other Ambulatory Visit: Payer: Medicare Other

## 2023-06-18 VITALS — BP 150/58 | HR 85 | Temp 97.2°F | Resp 16 | Wt 170.6 lb

## 2023-06-18 DIAGNOSIS — Z170 Estrogen receptor positive status [ER+]: Secondary | ICD-10-CM

## 2023-06-18 DIAGNOSIS — C773 Secondary and unspecified malignant neoplasm of axilla and upper limb lymph nodes: Secondary | ICD-10-CM | POA: Diagnosis present

## 2023-06-18 DIAGNOSIS — Z79810 Long term (current) use of selective estrogen receptor modulators (SERMs): Secondary | ICD-10-CM | POA: Insufficient documentation

## 2023-06-18 DIAGNOSIS — G629 Polyneuropathy, unspecified: Secondary | ICD-10-CM | POA: Insufficient documentation

## 2023-06-18 DIAGNOSIS — C50312 Malignant neoplasm of lower-inner quadrant of left female breast: Secondary | ICD-10-CM

## 2023-06-18 DIAGNOSIS — Z79899 Other long term (current) drug therapy: Secondary | ICD-10-CM | POA: Insufficient documentation

## 2023-06-18 LAB — CBC WITH DIFFERENTIAL/PLATELET
Abs Immature Granulocytes: 0.03 10*3/uL (ref 0.00–0.07)
Basophils Absolute: 0 10*3/uL (ref 0.0–0.1)
Basophils Relative: 0 %
Eosinophils Absolute: 0.2 10*3/uL (ref 0.0–0.5)
Eosinophils Relative: 2 %
HCT: 34.2 % — ABNORMAL LOW (ref 36.0–46.0)
Hemoglobin: 10.9 g/dL — ABNORMAL LOW (ref 12.0–15.0)
Immature Granulocytes: 0 %
Lymphocytes Relative: 19 %
Lymphs Abs: 1.6 10*3/uL (ref 0.7–4.0)
MCH: 26.7 pg (ref 26.0–34.0)
MCHC: 31.9 g/dL (ref 30.0–36.0)
MCV: 83.8 fL (ref 80.0–100.0)
Monocytes Absolute: 0.5 10*3/uL (ref 0.1–1.0)
Monocytes Relative: 6 %
Neutro Abs: 6.3 10*3/uL (ref 1.7–7.7)
Neutrophils Relative %: 73 %
Platelets: 194 10*3/uL (ref 150–400)
RBC: 4.08 MIL/uL (ref 3.87–5.11)
RDW: 16.8 % — ABNORMAL HIGH (ref 11.5–15.5)
WBC: 8.6 10*3/uL (ref 4.0–10.5)
nRBC: 0 % (ref 0.0–0.2)

## 2023-06-18 LAB — CMP (CANCER CENTER ONLY)
ALT: 11 U/L (ref 0–44)
AST: 22 U/L (ref 15–41)
Albumin: 3.9 g/dL (ref 3.5–5.0)
Alkaline Phosphatase: 77 U/L (ref 38–126)
Anion gap: 5 (ref 5–15)
BUN: 15 mg/dL (ref 8–23)
CO2: 28 mmol/L (ref 22–32)
Calcium: 9.3 mg/dL (ref 8.9–10.3)
Chloride: 105 mmol/L (ref 98–111)
Creatinine: 0.58 mg/dL (ref 0.44–1.00)
GFR, Estimated: >60 mL/min
Glucose, Bld: 114 mg/dL — ABNORMAL HIGH (ref 70–99)
Potassium: 4 mmol/L (ref 3.5–5.1)
Sodium: 138 mmol/L (ref 135–145)
Total Bilirubin: 0.5 mg/dL (ref 0.0–1.2)
Total Protein: 7.2 g/dL (ref 6.5–8.1)

## 2023-06-18 LAB — IRON AND IRON BINDING CAPACITY (CC-WL,HP ONLY)
Iron: 99 ug/dL (ref 28–170)
Saturation Ratios: 24 % (ref 10.4–31.8)
TIBC: 409 ug/dL (ref 250–450)
UIBC: 310 ug/dL (ref 148–442)

## 2023-06-18 LAB — MAGNESIUM: Magnesium: 1.8 mg/dL (ref 1.7–2.4)

## 2023-06-18 NOTE — Progress Notes (Signed)
 Patient Care Team: Pcp, No as PCP - General Otho Darner, MD as Consulting Physician (Family Medicine) Donnelly Angelica, RN as Oncology Nurse Navigator Pershing Proud, RN as Oncology Nurse Navigator Rachel Moulds, MD as Consulting Physician (Hematology and Oncology)  DIAGNOSIS:  No diagnosis found.   SUMMARY OF ONCOLOGIC HISTORY: Oncology History  Malignant neoplasm of lower-inner quadrant of left breast in female, estrogen receptor positive (HCC)  06/16/2013 Initial Diagnosis   Malignant neoplasm of lower-inner quadrant of left breast in female, estrogen receptor positive (HCC)   05/30/2021 - 04/03/2022 Chemotherapy   Patient is on Treatment Plan : BREAST Trastuzumab q28d      Genetic Testing   [facility] CancerNext-Expanded Panel was Negative. 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 Report date is 07/22/2021.  The CancerNext-Expanded gene panel offered by [facility] and includes sequencing, rearrangement, and RNA analysis for the following 77 genes: AIP, ALK, APC, ATM, AXIN2, BAP1, BARD1, BLM, BMPR1A, BRCA1, BRCA2, BRIP1, CDC73, CDH1, CDK4, CDKN1B, CDKN2A, CHEK2, CTNNA1, DICER1, FANCC, FH, FLCN, GALNT12, KIF1B, LZTR1, MAX, MEN1, MET, MLH1, MSH2, MSH3, MSH6, MUTYH, NBN, NF1, NF2, NTHL1, PALB2, PHOX2B, PMS2, POT1, PRKAR1A, PTCH1, PTEN, RAD51C, RAD51D, RB1, RECQL, RET, SDHA, SDHAF2, SDHB, SDHC, SDHD, SMAD4, SMARCA4, SMARCB1, SMARCE1, STK11, SUFU, TMEM127, TP53, TSC1, TSC2, VHL and XRCC2 (sequencing and deletion/duplication); EGFR, EGLN1, HOXB13, KIT, MITF, PDGFRA, POLD1, and POLE (sequencing only); EPCAM and GREM1 (deletion/duplication only).    05/01/2022 -  Chemotherapy   Patient is on Treatment Plan : BREAST ADO-Trastuzumab Emtansine (Kadcyla) q21d       CHIEF COMPLIANT: Follow-up on Kadcyla  HISTORY OF PRESENT ILLNESS: Discussed the use of AI scribe software for clinical note transcription with the patient, who gave verbal  consent to proceed.  History of Present Illness    Tami Schmidt is an 89 year old female who presents for follow-up regarding her cancer treatment regimen.  She has been undergoing cancer treatment since 2023, and currently, there is no evidence of cancer growth or rapid progression.  She experiences diarrhea as a side effect of the new medication. She experiences numbness and tingling, affecting her finger dexterity but not causing her to drop things. This is attributed to neuropathy, which she hopes will not be exacerbated by the new medication.  She reports constipation, which she manages with herbal remedies. She remains active, walking around the house and cooking. She experiences nocturia, needing to get up in the middle of the night to urinate, which she attributes to aging.  Her current medications include Norvasc and tamoxifen. She has a history of anemia, which has been stable, and her recent blood work showed a high blood sugar level, which she attributes to eating before the test. Her kidney and liver functions are reported as normal.  ALLERGIES:  is allergic to bactrim, lisinopril, vasotec, and sulfamethoxazole-trimethoprim.  MEDICATIONS:  Current Outpatient Medications  Medication Sig Dispense Refill   acetaminophen (TYLENOL) 500 MG tablet Take 1,000 mg by mouth as needed for moderate pain.     amLODipine (NORVASC) 10 MG tablet TAKE 1 TABLET BY MOUTH DAILY 90 tablet 3   ferrous sulfate 325 (65 FE) MG EC tablet Take 1 tablet by mouth every morning.     Multiple Vitamin (MULTIVITAMIN) tablet Take 1 tablet by mouth daily.     Neratinib Maleate (NERLYNX) 40 MG tablet Take 3 tablets (120 mg total) by mouth daily. Take with food. 180 tablet 0   potassium chloride  SA (KLOR-CON M) 20 MEQ tablet Take 1 tablet (20 mEq total) by mouth daily. (Patient not taking: Reported on 06/08/2023) 7 tablet 0   tamoxifen (NOLVADEX) 20 MG tablet Take 1 tablet (20 mg total) by mouth daily. 90 tablet 3    Turmeric (QC TUMERIC COMPLEX PO) Take 1 tablet by mouth daily.     VITAMIN D PO Take 1 tablet by mouth daily.     No current facility-administered medications for this visit.    PHYSICAL EXAMINATION: ECOG PERFORMANCE STATUS: 1 - Symptomatic but completely ambulatory  Vitals:   06/18/23 1342  BP: (!) 150/58  Pulse: 85  Resp: 16  Temp: (!) 97.2 F (36.2 C)  SpO2: 100%   Filed Weights   06/18/23 1342  Weight: 170 lb 9.6 oz (77.4 kg)   Physical Exam Constitutional:      Appearance: Normal appearance.  Cardiovascular:     Rate and Rhythm: Normal rate and regular rhythm.  Pulmonary:     Effort: Pulmonary effort is normal.     Breath sounds: Normal breath sounds.  Abdominal:     General: Abdomen is flat.     Palpations: Abdomen is soft.  Musculoskeletal:     Cervical back: Normal range of motion. No rigidity.  Lymphadenopathy:     Cervical: No cervical adenopathy.  Skin:    General: Skin is warm and dry.  Neurological:     General: No focal deficit present.     Mental Status: She is alert.  Psychiatric:        Mood and Affect: Mood normal.       LABORATORY DATA:  I have reviewed the data as listed    Latest Ref Rng & Units 06/08/2023    9:43 AM 05/24/2023    3:51 AM 05/23/2023    5:00 AM  CMP  Glucose 70 - 99 mg/dL 161  97  98   BUN 8 - 23 mg/dL 19  12  13    Creatinine 0.44 - 1.00 mg/dL 0.96  0.45  4.09   Sodium 135 - 145 mmol/L 137  140  137   Potassium 3.5 - 5.1 mmol/L 3.7  3.2  3.5   Chloride 98 - 111 mmol/L 104  108  107   CO2 22 - 32 mmol/L 25  24  24    Calcium 8.9 - 10.3 mg/dL 9.1  8.5  8.3   Total Protein 6.5 - 8.1 g/dL 7.0     Total Bilirubin 0.0 - 1.2 mg/dL 0.5     Alkaline Phos 38 - 126 U/L 75     AST 15 - 41 U/L 22     ALT 0 - 44 U/L 11       Lab Results  Component Value Date   WBC 6.8 06/08/2023   HGB 9.9 (L) 06/08/2023   HCT 31.6 (L) 06/08/2023   MCV 84.7 06/08/2023   PLT 189 06/08/2023   NEUTROABS 4.5 06/08/2023    ASSESSMENT &  PLAN:  Malignant neoplasm of lower-inner quadrant of left breast in female, estrogen receptor positive (HCC) 1993: Right lumpectomy axillary dissection in [place] (stage I) treated with radiation and tamoxifen x 5 years 2002: Right mastectomy: Right breast recurrence 2004: Left mastectomy: 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  Patient however insists on trying something orally.  She had asked me this question several times.  After much consideration we discussed about oral neratinib at 120 mg daily.  She understands this is not as effective as Kadcyla but if she does not respond to this, she wants to resume Kadcyla.  Discussed the potential benefits and side effects of switching to oral Nerlynx (diarrhea). Patient agreed to trial of oral therapy. She is finally cleared to get this and she will be receiving the medication today. -Start Nerlynx 120mg  daily, maintain this dose without weekly increases. -Imodium PRN for diarrhea -Start Tamoxifen 20 mg po daily. -Check response to oral therapy in 1-2 months.  Neuropathy Burning sensation in shins, likely medication-induced.  Although she denies any worsening of neuropathy, today she couldn't button her shirt, so I am worried there is worsening peripheral neuropathy with kadcyla. Will continue to monitor it, consider gabapentin in the future.  No orders of the defined types were placed in this encounter.  The patient has a good understanding of the overall plan. she agrees with it. she will call with any problems  that may develop before the next visit here. Total time spent: 30 mins including face to face time and time spent for planning, charting and co-ordination of care   Rachel Moulds, MD 06/18/23

## 2023-06-19 ENCOUNTER — Other Ambulatory Visit: Payer: Self-pay

## 2023-06-19 LAB — FERRITIN: Ferritin: 48 ng/mL (ref 11–307)

## 2023-07-09 ENCOUNTER — Ambulatory Visit (INDEPENDENT_AMBULATORY_CARE_PROVIDER_SITE_OTHER): Admitting: Podiatry

## 2023-07-09 ENCOUNTER — Encounter: Payer: Self-pay | Admitting: Podiatry

## 2023-07-09 DIAGNOSIS — I739 Peripheral vascular disease, unspecified: Secondary | ICD-10-CM | POA: Diagnosis not present

## 2023-07-09 DIAGNOSIS — E1142 Type 2 diabetes mellitus with diabetic polyneuropathy: Secondary | ICD-10-CM | POA: Diagnosis not present

## 2023-07-09 DIAGNOSIS — M79675 Pain in left toe(s): Secondary | ICD-10-CM | POA: Diagnosis not present

## 2023-07-09 DIAGNOSIS — B351 Tinea unguium: Secondary | ICD-10-CM

## 2023-07-09 DIAGNOSIS — M79674 Pain in right toe(s): Secondary | ICD-10-CM | POA: Diagnosis not present

## 2023-07-09 NOTE — Progress Notes (Signed)
 This patient returns to my office for at risk foot care.  This patient requires this care by a professional since this patient will be at risk due to having diabetes. This patient is unable to cut nails herself since the patient cannot reach her nails.These nails are painful walking and wearing shoes.  This patient presents for at risk foot care today.  General Appearance  Alert, conversant and in no acute stress.  Vascular  Dorsalis pedis and posterior tibial  pulses are  weakly palpable  bilaterally.  Capillary return is within normal limits  bilaterally. Temperature is within normal limits  bilaterally.  Neurologic  Senn-Weinstein monofilament wire test within normal limits  bilaterally. Muscle power within normal limits bilaterally.  Nails Thick disfigured discolored nails with subungual debris  from hallux to fifth toes bilaterally. No evidence of bacterial infection or drainage bilaterally.  Orthopedic  No limitations of motion  feet .  No crepitus or effusions noted.  No bony pathology .  Adducto-varus fifth toe left foot.   Skin  normotropic skin with no porokeratosis noted bilaterally.  No signs of infections or ulcers noted.     Onychomycosis  Pain in right toes  Pain in left toes  Consent was obtained for treatment procedures.   Mechanical debridement of nails 1-5  bilaterally performed with a nail nipper.  Filed with dremel without incident. Patient says she needs a heel lift right foot and toe cap fifth toe left foot.   Return office visit     3 months                 Told patient to return for periodic foot care and evaluation due to potential at risk complications.   Helane Gunther DPM

## 2023-07-10 ENCOUNTER — Other Ambulatory Visit: Payer: Self-pay

## 2023-07-15 ENCOUNTER — Telehealth: Payer: Self-pay

## 2023-07-15 NOTE — Telephone Encounter (Signed)
 Spoke with patient and confirmed appointment on 07/16/23

## 2023-07-16 ENCOUNTER — Inpatient Hospital Stay: Admitting: Hematology and Oncology

## 2023-07-16 ENCOUNTER — Inpatient Hospital Stay: Attending: Adult Health

## 2023-07-16 ENCOUNTER — Inpatient Hospital Stay

## 2023-07-16 VITALS — BP 165/62 | HR 70 | Temp 97.7°F | Resp 18 | Wt 169.8 lb

## 2023-07-16 DIAGNOSIS — Z170 Estrogen receptor positive status [ER+]: Secondary | ICD-10-CM | POA: Insufficient documentation

## 2023-07-16 DIAGNOSIS — Z95828 Presence of other vascular implants and grafts: Secondary | ICD-10-CM

## 2023-07-16 DIAGNOSIS — C773 Secondary and unspecified malignant neoplasm of axilla and upper limb lymph nodes: Secondary | ICD-10-CM | POA: Insufficient documentation

## 2023-07-16 DIAGNOSIS — Z452 Encounter for adjustment and management of vascular access device: Secondary | ICD-10-CM | POA: Insufficient documentation

## 2023-07-16 DIAGNOSIS — C50312 Malignant neoplasm of lower-inner quadrant of left female breast: Secondary | ICD-10-CM

## 2023-07-16 DIAGNOSIS — C50919 Malignant neoplasm of unspecified site of unspecified female breast: Secondary | ICD-10-CM

## 2023-07-16 DIAGNOSIS — Z79899 Other long term (current) drug therapy: Secondary | ICD-10-CM | POA: Insufficient documentation

## 2023-07-16 DIAGNOSIS — G629 Polyneuropathy, unspecified: Secondary | ICD-10-CM | POA: Insufficient documentation

## 2023-07-16 LAB — CMP (CANCER CENTER ONLY)
ALT: 11 U/L (ref 0–44)
AST: 21 U/L (ref 15–41)
Albumin: 4.1 g/dL (ref 3.5–5.0)
Alkaline Phosphatase: 73 U/L (ref 38–126)
Anion gap: 6 (ref 5–15)
BUN: 16 mg/dL (ref 8–23)
CO2: 28 mmol/L (ref 22–32)
Calcium: 9.3 mg/dL (ref 8.9–10.3)
Chloride: 105 mmol/L (ref 98–111)
Creatinine: 0.67 mg/dL (ref 0.44–1.00)
GFR, Estimated: >60 mL/min (ref >60)
Glucose, Bld: 98 mg/dL (ref 70–99)
Potassium: 4 mmol/L (ref 3.5–5.1)
Sodium: 139 mmol/L (ref 135–145)
Total Bilirubin: 0.4 mg/dL (ref 0.0–1.2)
Total Protein: 7.4 g/dL (ref 6.5–8.1)

## 2023-07-16 LAB — CBC WITH DIFFERENTIAL/PLATELET
Abs Immature Granulocytes: 0.01 10*3/uL (ref 0.00–0.07)
Basophils Absolute: 0 10*3/uL (ref 0.0–0.1)
Basophils Relative: 1 %
Eosinophils Absolute: 0.2 10*3/uL (ref 0.0–0.5)
Eosinophils Relative: 4 %
HCT: 37.2 % (ref 36.0–46.0)
Hemoglobin: 11.7 g/dL — ABNORMAL LOW (ref 12.0–15.0)
Immature Granulocytes: 0 %
Lymphocytes Relative: 30 %
Lymphs Abs: 1.9 10*3/uL (ref 0.7–4.0)
MCH: 26.2 pg (ref 26.0–34.0)
MCHC: 31.5 g/dL (ref 30.0–36.0)
MCV: 83.2 fL (ref 80.0–100.0)
Monocytes Absolute: 0.4 10*3/uL (ref 0.1–1.0)
Monocytes Relative: 7 %
Neutro Abs: 3.8 10*3/uL (ref 1.7–7.7)
Neutrophils Relative %: 58 %
Platelets: 179 10*3/uL (ref 150–400)
RBC: 4.47 MIL/uL (ref 3.87–5.11)
RDW: 15.7 % — ABNORMAL HIGH (ref 11.5–15.5)
WBC: 6.4 10*3/uL (ref 4.0–10.5)
nRBC: 0 % (ref 0.0–0.2)

## 2023-07-16 LAB — HEMOGLOBIN A1C
Hgb A1c MFr Bld: 5.9 % — ABNORMAL HIGH (ref 4.8–5.6)
Mean Plasma Glucose: 122.63 mg/dL

## 2023-07-16 LAB — MAGNESIUM: Magnesium: 1.8 mg/dL (ref 1.7–2.4)

## 2023-07-16 MED ORDER — SODIUM CHLORIDE 0.9% FLUSH
10.0000 mL | Freq: Once | INTRAVENOUS | Status: AC
  Administered 2023-07-16: 10 mL

## 2023-07-16 MED ORDER — HEPARIN SOD (PORK) LOCK FLUSH 100 UNIT/ML IV SOLN
500.0000 [IU] | Freq: Once | INTRAVENOUS | Status: AC
  Administered 2023-07-16: 500 [IU]

## 2023-07-16 NOTE — Progress Notes (Signed)
 Patient Care Team: Pcp, No as PCP - General Otho Darner, MD as Consulting Physician (Family Medicine) Donnelly Angelica, RN as Oncology Nurse Navigator Pershing Proud, RN as Oncology Nurse Navigator Rachel Moulds, MD as Consulting Physician (Hematology and Oncology)  SUMMARY OF ONCOLOGIC HISTORY: Oncology History  Malignant neoplasm of lower-inner quadrant of left breast in female, estrogen receptor positive (HCC)  06/16/2013 Initial Diagnosis   Malignant neoplasm of lower-inner quadrant of left breast in female, estrogen receptor positive (HCC)   05/30/2021 - 04/03/2022 Chemotherapy   Patient is on Treatment Plan : BREAST Trastuzumab q28d      Genetic Testing   [facility] CancerNext-Expanded Panel was Negative. 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 Report date is 07/22/2021.  The CancerNext-Expanded gene panel offered by [facility] and includes sequencing, rearrangement, and RNA analysis for the following 77 genes: AIP, ALK, APC, ATM, AXIN2, BAP1, BARD1, BLM, BMPR1A, BRCA1, BRCA2, BRIP1, CDC73, CDH1, CDK4, CDKN1B, CDKN2A, CHEK2, CTNNA1, DICER1, FANCC, FH, FLCN, GALNT12, KIF1B, LZTR1, MAX, MEN1, MET, MLH1, MSH2, MSH3, MSH6, MUTYH, NBN, NF1, NF2, NTHL1, PALB2, PHOX2B, PMS2, POT1, PRKAR1A, PTCH1, PTEN, RAD51C, RAD51D, RB1, RECQL, RET, SDHA, SDHAF2, SDHB, SDHC, SDHD, SMAD4, SMARCA4, SMARCB1, SMARCE1, STK11, SUFU, TMEM127, TP53, TSC1, TSC2, VHL and XRCC2 (sequencing and deletion/duplication); EGFR, EGLN1, HOXB13, KIT, MITF, PDGFRA, POLD1, and POLE (sequencing only); EPCAM and GREM1 (deletion/duplication only).    05/01/2022 -  Chemotherapy   Patient is on Treatment Plan : BREAST ADO-Trastuzumab Emtansine (Kadcyla) q21d       CHIEF COMPLIANT: Follow-up on neratinib  HISTORY OF PRESENT ILLNESS:  Discussed the use of AI scribe software for clinical note transcription with the patient, who gave verbal consent to proceed.  History of  Present Illness    Tami Schmidt is an 89 year old female who is here for follow up on neratinib. She has been doing really well overall. She once again remains forgetful and asks me why we transitioned to neratinib and not kadcyla. She appears to have ongoing neuropathy had difficulty buttoning up the shirt. She has no diarrhea with neratinib, feels constipated. No adverse effects reported with tamoxifen. Rest of the pertinent 10 point ROS reviewed and neg.   ALLERGIES:  is allergic to bactrim, lisinopril, vasotec, and sulfamethoxazole-trimethoprim.  MEDICATIONS:  Current Outpatient Medications  Medication Sig Dispense Refill   acetaminophen (TYLENOL) 500 MG tablet Take 1,000 mg by mouth as needed for moderate pain.     amLODipine (NORVASC) 10 MG tablet TAKE 1 TABLET BY MOUTH DAILY 90 tablet 3   ferrous sulfate 325 (65 FE) MG EC tablet Take 1 tablet by mouth every morning.     Multiple Vitamin (MULTIVITAMIN) tablet Take 1 tablet by mouth daily.     Neratinib Maleate (NERLYNX) 40 MG tablet Take 3 tablets (120 mg total) by mouth daily. Take with food. 180 tablet 0   potassium chloride SA (KLOR-CON M) 20 MEQ tablet Take 1 tablet (20 mEq total) by mouth daily. 7 tablet 0   tamoxifen (NOLVADEX) 20 MG tablet Take 1 tablet (20 mg total) by mouth daily. 90 tablet 3   Turmeric (QC TUMERIC COMPLEX PO) Take 1 tablet by mouth daily.     VITAMIN D PO Take 1 tablet by mouth daily.     No current facility-administered medications for this visit.    PHYSICAL EXAMINATION: ECOG PERFORMANCE STATUS: 1 - Symptomatic but completely ambulatory  Vitals:   07/16/23 1322  BP: Marland Kitchen)  165/62  Pulse: 70  Resp: 18  Temp: 97.7 F (36.5 C)  SpO2: 99%    Filed Weights   07/16/23 1322  Weight: 169 lb 12.8 oz (77 kg)    Physical Exam Constitutional:      Appearance: Normal appearance.  Cardiovascular:     Rate and Rhythm: Normal rate and regular rhythm.  Pulmonary:     Effort: Pulmonary effort is  normal.     Breath sounds: Normal breath sounds.  Abdominal:     General: Abdomen is flat.     Palpations: Abdomen is soft.  Musculoskeletal:     Cervical back: Normal range of motion. No rigidity.  Lymphadenopathy:     Cervical: No cervical adenopathy.  Skin:    General: Skin is warm and dry.  Neurological:     General: No focal deficit present.     Mental Status: She is alert.  Psychiatric:        Mood and Affect: Mood normal.       LABORATORY DATA:  I have reviewed the data as listed    Latest Ref Rng & Units 07/16/2023    1:30 PM 06/18/2023    3:00 PM 06/08/2023    9:43 AM  CMP  Glucose 70 - 99 mg/dL 98  098  119   BUN 8 - 23 mg/dL 16  15  19    Creatinine 0.44 - 1.00 mg/dL 1.47  8.29  5.62   Sodium 135 - 145 mmol/L 139  138  137   Potassium 3.5 - 5.1 mmol/L 4.0  4.0  3.7   Chloride 98 - 111 mmol/L 105  105  104   CO2 22 - 32 mmol/L 28  28  25    Calcium 8.9 - 10.3 mg/dL 9.3  9.3  9.1   Total Protein 6.5 - 8.1 g/dL 7.4  7.2  7.0   Total Bilirubin 0.0 - 1.2 mg/dL 0.4  0.5  0.5   Alkaline Phos 38 - 126 U/L 73  77  75   AST 15 - 41 U/L 21  22  22    ALT 0 - 44 U/L 11  11  11      Lab Results  Component Value Date   WBC 6.4 07/16/2023   HGB 11.7 (L) 07/16/2023   HCT 37.2 07/16/2023   MCV 83.2 07/16/2023   PLT 179 07/16/2023   NEUTROABS 3.8 07/16/2023    ASSESSMENT & PLAN:   Malignant neoplasm of lower-inner quadrant of left breast in female, estrogen receptor positive (HCC) 1993: Right lumpectomy axillary dissection in [place] (stage I) treated with radiation and tamoxifen x 5 years 2002: Right mastectomy: Right breast recurrence 2004: Left mastectomy: 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  HER2 Positive Breast Cancer She is on neratinib 120 mg po daily and is here for follow up.  She is tolerating neratinib very well so far. No dose escalation planned. She denies any complaints, no major concerns on physical exam. We will plan to repeat imaging once she has been on neratinib for 2 months. She is tolerating tamoxifen well, no complaints.  Neuropathy Although she denies any worsening of neuropathy, today she couldn't button her shirt, so I am worried there is worsening peripheral neuropathy with kadcyla. She once again refuses that this has been bothering her, so we will continue to monitor. Will continue to monitor it, consider gabapentin in the future.  Orders Placed This Encounter  Procedures   CT CHEST ABDOMEN PELVIS W CONTRAST    Standing Status:   Future    Expected Date:   08/19/2023    Expiration Date:   07/15/2024    If indicated for the ordered procedure, I authorize the administration of contrast media per Radiology protocol:   Yes    Does the patient have a contrast media/X-ray dye allergy?:   No    Preferred imaging location?:   [facility]    If indicated for the ordered procedure, I authorize the administration of oral contrast media per Radiology protocol:   Yes   The patient has a good understanding of the overall plan. she agrees with it. she will call with any problems that may develop before the next visit here.  Total time spent: 30 mins including face to face time and time spent for planning, charting and co-ordination of care   Rachel Moulds, MD 07/20/23

## 2023-07-16 NOTE — Assessment & Plan Note (Signed)
 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

## 2023-07-20 ENCOUNTER — Encounter: Payer: Self-pay | Admitting: Hematology and Oncology

## 2023-07-27 ENCOUNTER — Other Ambulatory Visit: Payer: Self-pay | Admitting: Hematology and Oncology

## 2023-07-27 MED ORDER — NERATINIB MALEATE 40 MG PO TABS: 120.0000 mg | ORAL_TABLET | Freq: Every day | ORAL | 3 refills | Status: DC

## 2023-07-28 ENCOUNTER — Telehealth: Payer: Self-pay | Admitting: *Deleted

## 2023-07-28 MED ORDER — TAMOXIFEN CITRATE 20 MG PO TABS: 20.0000 mg | ORAL_TABLET | Freq: Every day | ORAL | 3 refills | Status: DC

## 2023-07-28 NOTE — Telephone Encounter (Signed)
 Rx escribed to pharmacy on file

## 2023-07-29 ENCOUNTER — Other Ambulatory Visit: Payer: Self-pay | Admitting: Pharmacist

## 2023-07-29 MED ORDER — NERATINIB MALEATE 40 MG PO TABS: 120.0000 mg | ORAL_TABLET | Freq: Every day | ORAL | 0 refills | Status: DC

## 2023-07-30 ENCOUNTER — Telehealth: Payer: Self-pay | Admitting: Pharmacy Technician

## 2023-07-30 NOTE — Telephone Encounter (Signed)
 Oral Oncology Patient Advocate Encounter  Received a fax from [facility] Patient Tami Schmidt that they haven't been able to reach the patient. They are also asking to confirm if she is still taking 3 tablets daily. I see a new script was sent yesterday. Can you confirm and follow up with the patient?   Letter is indexed into pts chart. Should be available to view momentarily.  [facility] Patient Tami Schmidt # [phone]

## 2023-08-14 ENCOUNTER — Encounter: Payer: Self-pay | Admitting: Hematology and Oncology

## 2023-08-19 ENCOUNTER — Ambulatory Visit (HOSPITAL_COMMUNITY)
Admission: RE | Admit: 2023-08-19 | Discharge: 2023-08-19 | Disposition: A | Discharge status: Home / Self Care | Source: Ambulatory Visit | Attending: Hematology and Oncology | Admitting: Hematology and Oncology

## 2023-08-19 DIAGNOSIS — C50312 Malignant neoplasm of lower-inner quadrant of left female breast: Secondary | ICD-10-CM | POA: Diagnosis present

## 2023-08-19 DIAGNOSIS — Z170 Estrogen receptor positive status [ER+]: Secondary | ICD-10-CM | POA: Insufficient documentation

## 2023-08-19 MED ORDER — IOHEXOL 300 MG/ML  SOLN
100.0000 mL | Freq: Once | INTRAMUSCULAR | Status: DC | PRN
Start: 2023-08-19 — End: 2023-08-19

## 2023-08-19 MED ORDER — HEPARIN SOD (PORK) LOCK FLUSH 100 UNIT/ML IV SOLN
INTRAVENOUS | Status: AC
  Filled 2023-08-19: qty 5

## 2023-08-19 MED ORDER — IOHEXOL 300 MG/ML  SOLN
100.0000 mL | Freq: Once | INTRAMUSCULAR | Status: AC | PRN
  Administered 2023-08-19: 100 mL via INTRAVENOUS

## 2023-08-19 MED ORDER — HEPARIN SOD (PORK) LOCK FLUSH 100 UNIT/ML IV SOLN
500.0000 [IU] | Freq: Once | INTRAVENOUS | Status: AC
  Administered 2023-08-19: 500 [IU] via INTRAVENOUS

## 2023-08-20 ENCOUNTER — Other Ambulatory Visit: Payer: Self-pay

## 2023-08-20 ENCOUNTER — Telehealth: Payer: Self-pay

## 2023-08-20 DIAGNOSIS — C50312 Malignant neoplasm of lower-inner quadrant of left female breast: Secondary | ICD-10-CM

## 2023-08-20 DIAGNOSIS — Z170 Estrogen receptor positive status [ER+]: Secondary | ICD-10-CM

## 2023-08-20 NOTE — Telephone Encounter (Signed)
-----   Message from [facility] [facility] sent at 08/20/2023 12:13 PM EDT ----- Skeeter Dukes dont see a follow up for Ms Flanary, can you see if Loris Ros can see her next week. She will need labs CBC, CMP and Mg the day of the appointment.

## 2023-08-20 NOTE — Telephone Encounter (Signed)
 Called pt to offer f/u appt with NP per MD. Pt accepted appt for 08/24/23 at 130 for labs and 2 for visit. Orders placed per MD

## 2023-08-24 ENCOUNTER — Encounter: Payer: Self-pay | Admitting: Adult Health

## 2023-08-24 ENCOUNTER — Inpatient Hospital Stay: Admitting: Adult Health

## 2023-08-24 ENCOUNTER — Inpatient Hospital Stay: Attending: Adult Health

## 2023-08-24 ENCOUNTER — Inpatient Hospital Stay

## 2023-08-24 VITALS — BP 163/74 | HR 87 | Temp 97.5°F | Resp 18 | Ht 67.0 in | Wt 175.2 lb

## 2023-08-24 DIAGNOSIS — Z87891 Personal history of nicotine dependence: Secondary | ICD-10-CM | POA: Insufficient documentation

## 2023-08-24 DIAGNOSIS — Z1731 Human epidermal growth factor receptor 2 positive status: Secondary | ICD-10-CM | POA: Diagnosis not present

## 2023-08-24 DIAGNOSIS — Z9013 Acquired absence of bilateral breasts and nipples: Secondary | ICD-10-CM | POA: Insufficient documentation

## 2023-08-24 DIAGNOSIS — Z1721 Progesterone receptor positive status: Secondary | ICD-10-CM | POA: Insufficient documentation

## 2023-08-24 DIAGNOSIS — M25512 Pain in left shoulder: Secondary | ICD-10-CM

## 2023-08-24 DIAGNOSIS — Z170 Estrogen receptor positive status [ER+]: Secondary | ICD-10-CM | POA: Insufficient documentation

## 2023-08-24 DIAGNOSIS — C50919 Malignant neoplasm of unspecified site of unspecified female breast: Secondary | ICD-10-CM | POA: Diagnosis not present

## 2023-08-24 DIAGNOSIS — C50312 Malignant neoplasm of lower-inner quadrant of left female breast: Secondary | ICD-10-CM

## 2023-08-24 DIAGNOSIS — I890 Lymphedema, not elsewhere classified: Secondary | ICD-10-CM | POA: Diagnosis not present

## 2023-08-24 DIAGNOSIS — M8580 Other specified disorders of bone density and structure, unspecified site: Secondary | ICD-10-CM | POA: Diagnosis not present

## 2023-08-24 DIAGNOSIS — C773 Secondary and unspecified malignant neoplasm of axilla and upper limb lymph nodes: Secondary | ICD-10-CM | POA: Diagnosis present

## 2023-08-24 DIAGNOSIS — Z79810 Long term (current) use of selective estrogen receptor modulators (SERMs): Secondary | ICD-10-CM | POA: Diagnosis not present

## 2023-08-24 DIAGNOSIS — M25511 Pain in right shoulder: Secondary | ICD-10-CM | POA: Diagnosis not present

## 2023-08-24 DIAGNOSIS — Z95828 Presence of other vascular implants and grafts: Secondary | ICD-10-CM

## 2023-08-24 DIAGNOSIS — C7989 Secondary malignant neoplasm of other specified sites: Secondary | ICD-10-CM

## 2023-08-24 LAB — CBC WITH DIFFERENTIAL/PLATELET
Abs Immature Granulocytes: 0.01 10*3/uL (ref 0.00–0.07)
Basophils Absolute: 0 10*3/uL (ref 0.0–0.1)
Basophils Relative: 1 %
Eosinophils Absolute: 0.2 10*3/uL (ref 0.0–0.5)
Eosinophils Relative: 3 %
HCT: 36.8 % (ref 36.0–46.0)
Hemoglobin: 12 g/dL (ref 12.0–15.0)
Immature Granulocytes: 0 %
Lymphocytes Relative: 34 %
Lymphs Abs: 1.8 10*3/uL (ref 0.7–4.0)
MCH: 25.7 pg — ABNORMAL LOW (ref 26.0–34.0)
MCHC: 32.6 g/dL (ref 30.0–36.0)
MCV: 78.8 fL — ABNORMAL LOW (ref 80.0–100.0)
Monocytes Absolute: 0.4 10*3/uL (ref 0.1–1.0)
Monocytes Relative: 7 %
Neutro Abs: 3 10*3/uL (ref 1.7–7.7)
Neutrophils Relative %: 55 %
Platelets: 175 10*3/uL (ref 150–400)
RBC: 4.67 MIL/uL (ref 3.87–5.11)
RDW: 15.9 % — ABNORMAL HIGH (ref 11.5–15.5)
WBC: 5.4 10*3/uL (ref 4.0–10.5)
nRBC: 0 % (ref 0.0–0.2)

## 2023-08-24 LAB — MAGNESIUM: Magnesium: 1.9 mg/dL (ref 1.7–2.4)

## 2023-08-24 LAB — CMP (CANCER CENTER ONLY)
ALT: 11 U/L (ref 0–44)
AST: 19 U/L (ref 15–41)
Albumin: 4 g/dL (ref 3.5–5.0)
Alkaline Phosphatase: 70 U/L (ref 38–126)
Anion gap: 6 (ref 5–15)
BUN: 15 mg/dL (ref 8–23)
CO2: 28 mmol/L (ref 22–32)
Calcium: 8.9 mg/dL (ref 8.9–10.3)
Chloride: 105 mmol/L (ref 98–111)
Creatinine: 0.59 mg/dL (ref 0.44–1.00)
GFR, Estimated: >60 mL/min (ref >60)
Glucose, Bld: 111 mg/dL — ABNORMAL HIGH (ref 70–99)
Potassium: 4 mmol/L (ref 3.5–5.1)
Sodium: 139 mmol/L (ref 135–145)
Total Bilirubin: 0.4 mg/dL (ref 0.0–1.2)
Total Protein: 7.1 g/dL (ref 6.5–8.1)

## 2023-08-24 MED ORDER — HEPARIN SOD (PORK) LOCK FLUSH 100 UNIT/ML IV SOLN
500.0000 [IU] | Freq: Once | INTRAVENOUS | Status: AC
  Administered 2023-08-24: 500 [IU]

## 2023-08-24 MED ORDER — DICLOFENAC SODIUM 1 % EX GEL: 2.0000 g | Freq: Four times a day (QID) | CUTANEOUS | 3 refills | Status: DC

## 2023-08-24 MED ORDER — TAMOXIFEN CITRATE 20 MG PO TABS: 20.0000 mg | ORAL_TABLET | Freq: Every day | ORAL | 3 refills | Status: DC

## 2023-08-24 MED ORDER — SODIUM CHLORIDE 0.9% FLUSH
10.0000 mL | Freq: Once | INTRAVENOUS | Status: AC
  Administered 2023-08-24: 10 mL

## 2023-08-24 NOTE — Assessment & Plan Note (Addendum)
 Tami Schmidt is an 89 year old woman with metastatic recurrent breast cancer here today for follow-up and evaluation.  She is currently taking tamoxifen  and neratinib .  Metastatic breast cancer.  She has no clinical signs of progression.  Most recent CT chest abdomen pelvis May 8 was reviewed with the patient.  I reviewed Dr. Lorella Roles recommendation with her in detail.  She is agreeable to this approach and will continue neratinib  and tamoxifen .  We will repeat CT chest abdomen pelvis with contrast in 6 weeks time.  I requested follow-up with Dr. Arno Bibles to occur after her CT imaging. Arthralgias, in particular bilateral shoulder pain: I placed a referral to sports medicine.  I gave Dr. Garlan Juniper notification of the referral placement.  I also prescribed Maryetta Voltaren gel for her to apply. Left arm lymphedema: Referral placed to physical therapy for lymphedema evaluation and treatment. Constipation: She will continue with her home regimen.  Her labs today are stable and I reviewed these with her in detail.  We will see her back after her scans for labs and follow-up.  Please note that she has a Port-A-Cath and needs a flush appointment with each lab appointment.  This was noted accordingly today.

## 2023-08-24 NOTE — Progress Notes (Signed)
 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) Staging form: Breast, AJCC 7th Edition - Clinical: Stage Unknown (TX, N1, M0) - Signed by Willy Harvest, MD on 07/27/2014  Malignant neoplasm of lower-inner quadrant of left breast in female, estrogen receptor positive (HCC) Staging form: Breast, AJCC 7th Edition - Clinical: Stage IA (T1b, N0, M0) - Signed by Willy Harvest, MD on 07/27/2014    SUMMARY OF ONCOLOGIC HISTORY: 1993: Right lumpectomy axillary dissection in [place]  (stage I) treated with radiation and tamoxifen  x 5 years 2002: Right mastectomy: Right breast recurrence 2004: Left mastectomy: 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  CURRENT THERAPY: Neratinib /tamoxifen   INTERVAL HISTORY:  Tami Schmidt 89 y.o. female returns for follow-up and evaluation after undergoing CT chest abdomen pelvis on Aug 19, 2023.  This demonstrated a 3 mm increase size in her right hilar lymph node, no change to her left axillary nodes or the lytic lesion in the left second rib.  Incidentally she did have cholelithiasis, colonic diverticulosis without acute diverticulitis and aortic atherosclerosis.  I received an in basket from Dr. Arno Bibles who is out of the office this week requesting she be seen and also requesting for her to continue on her current treatment with repeat imaging in 6 weeks.  She tells me that she drinks a  gallon of water to get herself going and then she feels ok.  She is taking neratinib  3 tablets daily.  She experiences constipation with the neratinib  and has a bowel movement twice a week.  She manages this with an herb she takes.  She continues on tamoxifen  daily with good tolerance as well.    She is requesting a PT referral because her left arm lymphedema is not improving.  She is unable to wear her rings.  Her other main issue is arthritis and she wants to have votaren gel refilled and a referral to see a specialist to help.  Her main problem area is her shoulders.  She tells me that today she needed assistance with pulling up her pants due to her arthritis.    Patient Active Problem List   Diagnosis Date Noted   Acute lower GI bleeding 01/07/2023   Rectal bleeding 01/05/2023   Acute GI bleeding 10/04/2022   Port-A-Cath in place 06/12/2022   Loss of perception for taste 03/17/2022   Primary osteoarthritis involving multiple joints 03/17/2022   Genetic testing 07/24/2021   Microcytosis 04/29/2021   Hypokalemia 04/28/2021   Pulmonary embolism (HCC)    Agnosia 08/09/2020   Hardening of the aorta (main artery of the heart) (HCC) 08/09/2020   Morbid obesity (HCC) 08/09/2020   Neuropathy 08/09/2020   Primary osteoarthritis 08/09/2020   Sensorineural hearing loss (SNHL) of both ears 08/07/2020   Anosmia 06/05/2020   Tinnitus, bilateral 06/05/2020   GI bleed 07/13/2019   ABLA (acute blood loss anemia) 07/12/2019   Microcytic anemia 01/05/2018   Melena 01/04/2018   Lower GI bleed 01/04/2018   Diabetes  mellitus type II, controlled, with no complications (HCC) 12/05/2017   Tenosynovitis of left wrist 12/02/2017   Infection of left wrist (HCC) 11/30/2017   Primary osteoarthritis of right hip 01/05/2017   Osteoarthritis of right hip 01/03/2017   Lymphedema of upper extremity 01/17/2014   Osteopenia 01/17/2014   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   Dermatosis papulosa nigra  08/02/2013   Dilated pore of Winer 08/02/2013   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   Diabetes mellitus    fasting 90-100   HTN (hypertension) 03/25/2013   Hx of radiation therapy    breasts hx   Hypercholesterolemia    Hypertension    Lymphedema of arm    left arm   Pulmonary embolism (HCC)    Type II or unspecified type diabetes mellitus without mention of complication, not stated as uncontrolled 03/25/2013    SURGICAL HISTORY: Past Surgical History:  Procedure Laterality Date   ABDOMINAL HYSTERECTOMY     ANKLE SURGERY Right 1995   APPENDECTOMY     BIOPSY  01/07/2018   Procedure: BIOPSY;  Surgeon: Genell Ken, MD;  Location: [facility];  Service: Gastroenterology;;   BREAST SURGERY Bilateral    mastectomy   COLONOSCOPY WITH PROPOFOL  N/A 01/07/2018   Procedure: COLONOSCOPY WITH PROPOFOL ;  Surgeon: Genell Ken, MD;  Location: [facility];  Service: Gastroenterology;  Laterality: N/A;   ESOPHAGOGASTRODUODENOSCOPY (EGD) WITH PROPOFOL  N/A 01/06/2018   Procedure: ESOPHAGOGASTRODUODENOSCOPY (EGD) WITH PROPOFOL ;  Surgeon: Genell Ken, MD;  Location: [facility];  Service: Gastroenterology;  Laterality: N/A;   HERNIA REPAIR  04-26-2010   IR IMAGING GUIDED PORT INSERTION  10/29/2021   MASS EXCISION Left 01/26/2013   Procedure:  EXCISION LEFT CHEST WALL MASS AND LEFT ABDOMNAL WALL MASS;  Surgeon: Rogena Class, MD;  Location: [facility];  Service: General;  Laterality: Left;   MASS EXCISION Left 09/20/2014   Procedure: EXCISION OF LEFT CHEST WALL MASS;  Surgeon: Oza Blumenthal, MD;  Location: [facility];  Service: General;  Laterality: Left;   TEE WITHOUT CARDIOVERSION N/A 12/08/2017   Procedure: TRANSESOPHAGEAL ECHOCARDIOGRAM (TEE);  Surgeon: Lenise Quince, MD;  Location: [facility];  Service: Cardiovascular;  Laterality: N/A;   TOTAL HIP ARTHROPLASTY Right 01/05/2017   TOTAL HIP ARTHROPLASTY Right 01/05/2017   Procedure: TOTAL HIP ARTHROPLASTY ANTERIOR APPROACH;  Surgeon: Wendolyn Hamburger, MD;  Location: [facility] OR;  Service: Orthopedics;  Laterality: Right;    SOCIAL HISTORY: Social History   Socioeconomic History   Marital status: Married    Spouse name: Not on file   Number of children: Not on file   Years of education: Not on file   Highest education level: Not on file  Occupational History   Occupation: retired Child psychotherapist  Tobacco Use   Smoking status: Former    Current packs/day: 0.00    Types: Cigarettes  Quit date: 04/14/1972    Years since quitting: 51.3   Smokeless tobacco: Never  Vaping Use   Vaping status: Never Used  Substance and Sexual Activity   Alcohol use: Not Currently    Comment: occasional   Drug use: Not Currently   Sexual activity: Yes    Birth control/protection: Surgical  Other Topics Concern   Not on file  Social History Narrative   Not on file   Social Drivers of Health   Financial Resource Strain: Not on file  Food Insecurity: No Food Insecurity (05/21/2023)   Hunger Vital Sign    Worried About Running Out of Food in the Last Year: Never true    Ran Out of Food in the Last Year: Never true  Transportation Needs: No Transportation Needs (05/21/2023)   PRAPARE - Administrator, Civil Service (Medical): No    Lack of Transportation  (Non-Medical): No  Physical Activity: Not on file  Stress: Not on file  Social Connections: Socially Integrated (05/21/2023)   Social Connection and Isolation Panel [NHANES]    Frequency of Communication with Friends and Family: More than three times a week    Frequency of Social Gatherings with Friends and Family: More than three times a week    Attends Religious Services: More than 4 times per year    Active Member of [employer] or Organizations: Yes    Attends Engineer, structural: More than 4 times per year    Marital Status: Married  Catering manager Violence: Not At Risk (05/21/2023)   Humiliation, Afraid, Rape, and Kick questionnaire    Fear of Current or Ex-Partner: No    Emotionally Abused: No    Physically Abused: No    Sexually Abused: No    FAMILY HISTORY: Family History  Problem Relation Age of Onset   Breast cancer Cousin        maternal first cousin   Uterine cancer Cousin        maternal first cousin    Review of Systems  Constitutional:  Negative for appetite change, chills, fatigue, fever and unexpected weight change.  HENT:   Negative for hearing loss, lump/mass and trouble swallowing.   Eyes:  Negative for eye problems and icterus.  Respiratory:  Negative for chest tightness, cough and shortness of breath.   Cardiovascular:  Negative for chest pain, leg swelling and palpitations.  Gastrointestinal:  Positive for constipation. Negative for abdominal distention, abdominal pain, blood in stool, diarrhea, nausea, rectal pain and vomiting.  Endocrine: Negative for hot flashes.  Genitourinary:  Negative for difficulty urinating.   Musculoskeletal:  Positive for arthralgias. Negative for back pain and myalgias.  Skin:  Negative for itching and rash.  Neurological:  Negative for dizziness, extremity weakness, headaches and numbness.  Hematological:  Negative for adenopathy. Does not bruise/bleed easily.  Psychiatric/Behavioral:  Negative for depression. The  patient is not nervous/anxious.       PHYSICAL EXAMINATION    Vitals:   08/24/23 1443 08/24/23 1445  BP: (!) 179/69 (!) 163/74  Pulse: 87   Resp: 18   Temp: (!) 97.5 F (36.4 C)   SpO2: 100%     Physical Exam Constitutional:      General: She is not in acute distress.    Appearance: Normal appearance. She is not toxic-appearing.  HENT:     Head: Normocephalic and atraumatic.     Mouth/Throat:     Mouth: Mucous membranes are moist.     Pharynx: Oropharynx  is clear. No oropharyngeal exudate or posterior oropharyngeal erythema.  Eyes:     General: No scleral icterus. Cardiovascular:     Rate and Rhythm: Normal rate and regular rhythm.     Pulses: Normal pulses.     Heart sounds: Normal heart sounds.  Pulmonary:     Effort: Pulmonary effort is normal.     Breath sounds: Normal breath sounds.  Abdominal:     General: Abdomen is flat. Bowel sounds are normal. There is no distension.     Palpations: Abdomen is soft.     Tenderness: There is no abdominal tenderness.  Musculoskeletal:        General: Swelling (left arm lymphedema) present.     Cervical back: Neck supple.  Lymphadenopathy:     Cervical: No cervical adenopathy.  Skin:    General: Skin is warm and dry.     Findings: No rash.  Neurological:     General: No focal deficit present.     Mental Status: She is alert.  Psychiatric:        Mood and Affect: Mood normal.        Behavior: Behavior normal.     LABORATORY DATA:  CBC    Component Value Date/Time   WBC 5.4 08/24/2023 1409   RBC 4.67 08/24/2023 1409   HGB 12.0 08/24/2023 1409   HGB 9.9 (L) 06/08/2023 0943   HGB 10.5 (L) 01/20/2017 1057   HCT 36.8 08/24/2023 1409   HCT 25.8 (L) 01/05/2018 0513   HCT 32.8 (L) 01/20/2017 1057   PLT 175 08/24/2023 1409   PLT 189 06/08/2023 0943   PLT 368 01/20/2017 1057   MCV 78.8 (L) 08/24/2023 1409   MCV 77.8 (L) 01/20/2017 1057   MCH 25.7 (L) 08/24/2023 1409   MCHC 32.6 08/24/2023 1409   RDW 15.9 (H)  08/24/2023 1409   RDW 16.0 (H) 01/20/2017 1057   LYMPHSABS 1.8 08/24/2023 1409   LYMPHSABS 1.3 01/20/2017 1057   MONOABS 0.4 08/24/2023 1409   MONOABS 0.4 01/20/2017 1057   EOSABS 0.2 08/24/2023 1409   EOSABS 0.1 01/20/2017 1057   BASOSABS 0.0 08/24/2023 1409   BASOSABS 0.1 01/20/2017 1057    CMP     Component Value Date/Time   NA 139 08/24/2023 1409   NA 139 01/20/2017 1057   K 4.0 08/24/2023 1409   K 3.9 01/20/2017 1057   CL 105 08/24/2023 1409   CO2 28 08/24/2023 1409   CO2 25 01/20/2017 1057   GLUCOSE 111 (H) 08/24/2023 1409   GLUCOSE 135 01/20/2017 1057   BUN 15 08/24/2023 1409   BUN 15.6 01/20/2017 1057   CREATININE 0.59 08/24/2023 1409   CREATININE 0.7 01/20/2017 1057   CALCIUM 8.9 08/24/2023 1409   CALCIUM 9.3 01/20/2017 1057   PROT 7.1 08/24/2023 1409   PROT 7.1 01/20/2017 1057   ALBUMIN 4.0 08/24/2023 1409   ALBUMIN 3.4 (L) 01/20/2017 1057   AST 19 08/24/2023 1409   AST 10 01/20/2017 1057   ALT 11 08/24/2023 1409   ALT 14 01/20/2017 1057   ALKPHOS 70 08/24/2023 1409   ALKPHOS 69 01/20/2017 1057   BILITOT 0.4 08/24/2023 1409   BILITOT 0.43 01/20/2017 1057   GFRNONAA >60 08/24/2023 1409   GFRAA >60 07/14/2019 0538     ASSESSMENT and THERAPY PLAN:   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  She is currently taking  tamoxifen  and neratinib .  Metastatic breast cancer.  She has no clinical signs of progression.  Most recent CT chest abdomen pelvis May 8 was reviewed with the patient.  I reviewed Dr. Lorella Roles recommendation with her in detail.  She is agreeable to this approach and will continue neratinib  and tamoxifen .  We will repeat CT chest abdomen pelvis with contrast in 6 weeks time.  I requested follow-up with Dr. Arno Bibles to occur after her CT imaging. Arthralgias, in particular bilateral shoulder pain: I placed a referral to sports medicine.   I gave Dr. Garlan Juniper notification of the referral placement.  I also prescribed Tami Schmidt Voltaren gel for her to apply. Left arm lymphedema: Referral placed to physical therapy for lymphedema evaluation and treatment. Constipation: She will continue with her home regimen.  Her labs today are stable and I reviewed these with her in detail.  We will see her back after her scans for labs and follow-up.  Please note that she has a Port-A-Cath and needs a flush appointment with each lab appointment.  This was noted accordingly today.  All questions were answered. The patient knows to call the clinic with any problems, questions or concerns. We can certainly see the patient much sooner if necessary.  Total encounter time:40 minutes*in face-to-face visit time, chart review, lab review, care coordination, order entry, and documentation of the encounter time.    Alwin Baars, NP 08/24/23 3:53 PM Medical Oncology and Hematology [facility] [address] [place], [place] [address] Tel. [phone]    Fax. [phone]  *Total Encounter Time as defined by the Centers for Medicare and Medicaid Services includes, in addition to the face-to-face time of a patient visit (documented in the note above) non-face-to-face time: obtaining and reviewing outside history, ordering and reviewing medications, tests or procedures, care coordination (communications with other health care professionals or caregivers) and documentation in the medical record.

## 2023-08-26 NOTE — Therapy (Signed)
 OUTPATIENT PHYSICAL THERAPY  UPPER EXTREMITY ONCOLOGY EVALUATION  Patient Name: Tami Schmidt MRN: [id number] DOB:01/13/1933, 89 y.o., female Today's Date: 08/27/2023  END OF SESSION:  PT End of Session - 08/27/23 1406     Visit Number 1    Number of Visits 12    Date for PT Re-Evaluation 10/08/23    Authorization Type [employer]    PT Start Time 1408   filling out forms up front for [facility] so started late   PT Stop Time 1450    PT Time Calculation (min) 42 min    Activity Tolerance Patient tolerated treatment well    Behavior During Therapy WFL for tasks assessed/performed             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   Diabetes mellitus    fasting 90-100   HTN (hypertension) 03/25/2013   Hx of radiation therapy    breasts hx   Hypercholesterolemia    Hypertension    Lymphedema of arm    left arm   Pulmonary embolism (HCC)    Type II or unspecified type diabetes mellitus without mention of complication, not stated as uncontrolled 03/25/2013   Past Surgical History:  Procedure Laterality Date   ABDOMINAL HYSTERECTOMY     ANKLE SURGERY Right 1995   APPENDECTOMY     BIOPSY  01/07/2018   Procedure: BIOPSY;  Surgeon: Genell Ken, MD;  Location: [facility];  Service: Gastroenterology;;   BREAST SURGERY Bilateral    mastectomy   COLONOSCOPY WITH PROPOFOL  N/A 01/07/2018   Procedure: COLONOSCOPY WITH PROPOFOL ;  Surgeon: Genell Ken, MD;  Location: [facility];  Service: Gastroenterology;  Laterality: N/A;   ESOPHAGOGASTRODUODENOSCOPY (EGD) WITH PROPOFOL  N/A 01/06/2018   Procedure: ESOPHAGOGASTRODUODENOSCOPY (EGD) WITH PROPOFOL ;  Surgeon: Genell Ken, MD;  Location: [facility];  Service: Gastroenterology;  Laterality: N/A;   HERNIA REPAIR  04-26-2010   IR IMAGING GUIDED PORT INSERTION  10/29/2021   MASS EXCISION Left 01/26/2013    Procedure: EXCISION LEFT CHEST WALL MASS AND LEFT ABDOMNAL WALL MASS;  Surgeon: Rogena Class, MD;  Location: [facility];  Service: General;  Laterality: Left;   MASS EXCISION Left 09/20/2014   Procedure: EXCISION OF LEFT CHEST WALL MASS;  Surgeon: Oza Blumenthal, MD;  Location: [facility];  Service: General;  Laterality: Left;   TEE WITHOUT CARDIOVERSION N/A 12/08/2017   Procedure: TRANSESOPHAGEAL ECHOCARDIOGRAM (TEE);  Surgeon: Lenise Quince, MD;  Location: [facility];  Service: Cardiovascular;  Laterality: N/A;   TOTAL HIP ARTHROPLASTY Right 01/05/2017   TOTAL HIP ARTHROPLASTY Right 01/05/2017   Procedure: TOTAL HIP ARTHROPLASTY ANTERIOR APPROACH;  Surgeon: Wendolyn Hamburger, MD;  Location: [facility];  Service: Orthopedics;  Laterality: Right;   Patient Active Problem List   Diagnosis Date Noted   Acute lower GI bleeding 01/07/2023   Rectal bleeding 01/05/2023   Acute GI bleeding 10/04/2022   Port-A-Cath in place 06/12/2022   Loss of perception for taste 03/17/2022   Primary osteoarthritis involving multiple joints 03/17/2022   Genetic testing 07/24/2021   Microcytosis 04/29/2021   Hypokalemia 04/28/2021   Pulmonary embolism (HCC)    Agnosia 08/09/2020   Hardening of the aorta (main artery of the heart) (HCC) 08/09/2020   Morbid obesity (HCC) 08/09/2020   Neuropathy 08/09/2020   Primary osteoarthritis 08/09/2020  Sensorineural hearing loss (SNHL) of both ears 08/07/2020   Anosmia 06/05/2020   Tinnitus, bilateral 06/05/2020   GI bleed 07/13/2019   ABLA (acute blood loss anemia) 07/12/2019   Microcytic anemia 01/05/2018   Melena 01/04/2018   Lower GI bleed 01/04/2018   Diabetes mellitus type II, controlled, with no complications (HCC) 12/05/2017   Tenosynovitis of left wrist 12/02/2017   Infection of left wrist (HCC) 11/30/2017   Primary osteoarthritis of right hip 01/05/2017   Osteoarthritis of right hip 01/03/2017   Lymphedema of upper extremity 01/17/2014    Osteopenia 01/17/2014   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   Dilated pore of Winer 08/02/2013   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 08/02/2013   Scar 08/02/2013   Malignant neoplasm of lower-inner quadrant of left breast in female, estrogen receptor positive (HCC) 06/16/2013   Type II or unspecified type diabetes mellitus without mention of complication, not stated as uncontrolled 03/25/2013   Essential hypertension 03/25/2013   Chest wall recurrence of breast cancer (HCC) 02/21/2013   Abdominal wall mass 01/14/2013    PCP:   REFERRING PROVIDER: Alwin Baars, NP  REFERRING DIAG: Lymphedema s/p breast cancer  THERAPY DIAG:  Lymphedema, not elsewhere classified  Aftercare following surgery for neoplasm  ONSET DATE: 2014 with exacerbation  Rationale for Evaluation and Treatment: Rehabilitation  SUBJECTIVE:                                                                                                                                                                                           SUBJECTIVE STATEMENT:   The lymphedema in my arm seems to be worse and the NP wanted me to come see you. She doesn't wear her sleeve much because it doesn't really compress. The night garment is still too  big also. I have not been wrapped or in the sleeve for about 6 months. She also has a Flexi touch that she uses every once in a while.  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 Pts husband has wrapped before and she  has a night garment  PAIN:  Are you having pain? No  PRECAUTIONS: right THR and diabetes, prior PE , significant OA bilateral shoulders, knees  RED FLAGS: None   WEIGHT BEARING RESTRICTIONS: No, but she uses a cane for knee OA  FALLS:  Has patient fallen in last 6 months? 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  COGNITION: Overall cognitive status: Within functional limits for tasks assessed   PALPATION: Significant fibrosis noted left forearm, mild pitting noted dorsum of hand and ulnar border of forearm  OBSERVATIONS / OTHER ASSESSMENTS: pt uses SPC, antalgic gait on left  SENSATION: Light touch:    POSTURE: forward head, rounded shoulders  UPPER EXTREMITY AROM/PROM:  A/PROM RIGHT   eval   Shoulder extension   Shoulder flexion 80(OA)  Shoulder abduction   Shoulder internal rotation   Shoulder external rotation     (Blank rows = not tested)  A/PROM LEFT   eval  Shoulder extension   Shoulder flexion 60 (OA)  Shoulder abduction   Shoulder internal rotation   Shoulder external rotation     (Blank rows = not tested)  CERVICAL AROM: All within functional limits:     UPPER EXTREMITY STRENGTH:   LYMPHEDEMA ASSESSMENTS:   SURGERY  TYPE/DATE:  1993: Right lumpectomy axillary dissection in [place]  (stage I) treated with radiation and tamoxifen  x 5 years 2002: Right mastectomy: Right breast recurrence 2004: Left mastectomy: 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 Developed lymphedema after this    2024 reoccurence LN's; had chemotherapy NUMBER OF LYMPH NODES REMOVED: ?  CHEMOTHERAPY: YES, last reoccurence  RADIATION:YES  HORMONE TREATMENT: YES,Neratinib /tamoxifen    INFECTIONS: NO   LYMPHEDEMA ASSESSMENTS:   LANDMARK RIGHT  eval  At axilla  30.3  15 cm proximal to olecranon process 28.3  10 cm proximal to olecranon process 26.7  Olecranon process 22.4  15 cm proximal to ulnar styloid process 20  10 cm proximal to ulnar styloid process 17.3  Just proximal to ulnar styloid process 15.1  Across hand at thumb web space 19.2  At base of 2nd digit 5.95  (Blank rows = not tested)  LANDMARK LEFT  eval  At axilla  36.0  15 cm proximal to olecranon process 35.1  10 cm proximal to olecranon process 35.0  Olecranon process 30.4  15 cm proximal to ulnar styloid process 29.8  10 cm proximal to ulnar styloid process 26.8  Just proximal to ulnar styloid process 23.4  Across hand at thumb web space 20.6  At base of 2nd digit 6.4  (Blank rows = not tested)   FUNCTIONAL TESTS:    GAIT: Distance walked: front desk to room 8 Assistive device utilized: Single point cane Level of assistance: Modified independence Comments: antalgic gait  LYMPHEDEMA LIFE IMPACT SCALE: 19  TREATMENT DATE:  08/27/2023    PATIENT EDUCATION:  Education details: LOS, POC, treatment interventions Person educated: Patient Education method: Explanation Education comprehension: verbalized understanding  HOME  EXERCISE PROGRAM:   ASSESSMENT:  CLINICAL IMPRESSION: Patient is an 89 y.o. female who was seen today for physical therapy evaluation and treatment for exacerbation of left UE lymphedema. Pt has a Flexi touch at home that she uses some, but notes that her compression sleeve is too stretched out and her night garment has always been too big. Her Left UE has significant fibrosis, as well as mild pitting throughout the dorsum of the hand and forearm. Her left arm is  6 cm or greater in multiple places versus the right. She will benefit from CDT to reduce lymphedema, and to assist with purchasing the correct day and night garments. Pt said she can only come 2x/week, but her husband witll also wrap her at home when needed. She was told he should come in with her to review before he starts to do at home.   OBJECTIVE IMPAIRMENTS: decreased activity tolerance, decreased knowledge of condition, decreased mobility, decreased ROM, decreased strength, increased edema, impaired UE functional use, and postural dysfunction.   ACTIVITY LIMITATIONS: carrying, lifting, standing, stairs, reach over head, and hygiene/grooming  PARTICIPATION LIMITATIONS: cleaning, shopping, and community activity  PERSONAL FACTORS: 3+ comorbidities: Left Breast Cancer with re;occurrence, radiation, chemotherapy, Lymphedema are also affecting patient's functional outcome.   REHAB POTENTIAL: Good  CLINICAL DECISION MAKING: Stable/uncomplicated  EVALUATION COMPLEXITY: Low  GOALS: Goals reviewed with patient? Yes  SHORT TERM GOALS: Target date: 09/17/2023  Pts husband will be independent with compression bandaging Baseline: Goal status: INITIAL  2.  Pt will have 2 cm reduction at 10 cm above ulna styloid process Baseline:  Goal status: INITIAL  3.  Pt will have 2 cm reduction prox to olecranon for improved fit of clothing Baseline:  Goal status: INITIAL  4.  Pt will be tolerant of compression bandaging Baseline:  Goal  status: INITIAL   LONG TERM GOALS: Target date: 10/08/2023  Pt will have decreased edema at 10 cm prox to ulna styloid process by 3 cm or greater for better fit of clothing Baseline:  Goal status: INITIAL  2.  Pt will have decreased edema at 10 cm prox to olecranon by 3 cm or greater for improved clothig fit Baseline:  Goal status: INITIAL  3.  Pt will have appropriate day and night garments to control left UE lymphedema Baseline:  Goal status: INITIAL  4.  Pt/husband will be independent in management of lymphedema Baseline:  Goal status: INITIAL   PLAN:  PT FREQUENCY: 2x/week  PT DURATION: 6 weeks  PLANNED INTERVENTIONS: 97164- PT Re-evaluation, 97110-Therapeutic exercises, 97530- Therapeutic activity, 97535- Self Care, [id number]- Manual therapy, 97760- Orthotic Initial, [id number]- Orthotic/Prosthetic subsequent, Patient/Family education, and Manual lymph drainage  PLAN FOR NEXT SESSION: Initiate CDT ,if husband present practice bandaging. Pt has Flexi touch at home; encourage use before her husband wraps.  Latisha Poland, PT 08/27/2023, 4:28 PM

## 2023-08-27 ENCOUNTER — Ambulatory Visit: Attending: Adult Health

## 2023-08-27 ENCOUNTER — Other Ambulatory Visit: Payer: Self-pay

## 2023-08-27 DIAGNOSIS — Z483 Aftercare following surgery for neoplasm: Secondary | ICD-10-CM

## 2023-08-27 DIAGNOSIS — C50312 Malignant neoplasm of lower-inner quadrant of left female breast: Secondary | ICD-10-CM | POA: Diagnosis not present

## 2023-08-27 DIAGNOSIS — Z170 Estrogen receptor positive status [ER+]: Secondary | ICD-10-CM | POA: Insufficient documentation

## 2023-08-27 DIAGNOSIS — I890 Lymphedema, not elsewhere classified: Secondary | ICD-10-CM

## 2023-09-01 ENCOUNTER — Ambulatory Visit: Admitting: Family Medicine

## 2023-09-01 ENCOUNTER — Ambulatory Visit

## 2023-09-01 ENCOUNTER — Ambulatory Visit (INDEPENDENT_AMBULATORY_CARE_PROVIDER_SITE_OTHER)

## 2023-09-01 ENCOUNTER — Other Ambulatory Visit: Payer: Self-pay

## 2023-09-01 ENCOUNTER — Encounter: Payer: Self-pay | Admitting: Family Medicine

## 2023-09-01 VITALS — BP 136/80 | HR 78 | Ht 67.0 in | Wt 172.0 lb

## 2023-09-01 DIAGNOSIS — M25512 Pain in left shoulder: Secondary | ICD-10-CM | POA: Diagnosis not present

## 2023-09-01 DIAGNOSIS — G8929 Other chronic pain: Secondary | ICD-10-CM | POA: Diagnosis not present

## 2023-09-01 DIAGNOSIS — M25511 Pain in right shoulder: Secondary | ICD-10-CM | POA: Diagnosis not present

## 2023-09-01 DIAGNOSIS — M25562 Pain in left knee: Secondary | ICD-10-CM | POA: Diagnosis not present

## 2023-09-01 DIAGNOSIS — I890 Lymphedema, not elsewhere classified: Secondary | ICD-10-CM | POA: Diagnosis not present

## 2023-09-01 DIAGNOSIS — Z483 Aftercare following surgery for neoplasm: Secondary | ICD-10-CM

## 2023-09-01 NOTE — Progress Notes (Signed)
 I, Miquel Amen, CMA acting as a scribe for Garlan Juniper, MD.  Tami Schmidt is a 89 y.o. female who presents to [facility] at [facility] today for bilat shoulder pain x 2 months. Pt locates pain to entire shoulder joints. Denies radiating pain into the arm. Reports decreased ROM reaching up and back, painful reaching across the body. N/T in the left 5th digit s/p cancer treatment. Denies night disturbance. Pt is RHD. Notes that left shoulder sx are slightly worse than right. Ambulating with a cane today. Also c/o left knee pain x 2-3 years. Locates pain to medial aspect of the knee. Denies swelling. Mechanical sx present.   She notes her lack of shoulder range of motion is bothersome.  She is having trouble washing her hair for example.  She is able to bathe and dress herself.  Her knee pain is bothersome enough that it is interfering with walking.  She is now using a cane to help with her gait.  Hx of malignant neoplasm of the L breast. Currently taking tamoxifin.  Radiates: denies Aggravates: use Treatments tried: Tylenol , IBU prn, rest, heat  Pertinent review of systems: No fevers or chills  Relevant historical information: Pulmonary embolism.  Diabetes.  Breast cancer.  Left arm lymphedema chronic.  Currently receiving physical therapy for left arm lymphedema.   Exam:  BP 136/80   Pulse 78   Ht 5\' 7"  (1.702 m)   Wt 172 lb (78 kg)   SpO2 98%   BMI 26.94 kg/m  General: Well Developed, well nourished, and in no acute distress.   MSK: Shoulders bilaterally decreased range of motion decree strength.  Left knee moderate effusion genu varus appearance antalgic gait using a cane to ambulate.   Lab and Radiology Results  X-ray images bilateral shoulder and left knee obtained today personally and independently interpreted.  Right shoulder: Very severe glenohumeral DJD with significant change in the shape of the glenohumeral joint.  Left shoulder: Severe  glenohumeral DJD.  No acute fractures.  Left knee: Moderate to severe medial DJD and moderate patellofemoral DJD.  No acute fractures.  Await formal radiology review    Assessment and Plan: 89 y.o. female with chronic bilateral shoulder pain and left knee pain.  This is all due to DJD.  She has very severe osteoarthritis in the right shoulder and severe left shoulder and moderate to severe medial knee DJD.  She would like to avoid steroid injections as they have not been very helpful in the past which I think is reasonable.  Plan to maximize her functional ability and range of motion with physical therapy.  At the very least I think this can help her gait and reduce her fall risk.  Checking back in 6 weeks.  Happy to do steroid injections in both shoulders and steroid injection in the left knee followed by gel injections or Zilretta injections.  We did talk a little bit about considering genicular artery embolization for the left knee pain.  She is not a great candidate for total joint replacement and wants to avoid that anyway.  Tami Schmidt is delightful  PDMP not reviewed this encounter. Orders Placed This Encounter  Procedures   US  LIMITED JOINT SPACE STRUCTURES UP BILAT(NO LINKED CHARGES)    Reason for Exam (SYMPTOM  OR DIAGNOSIS REQUIRED):   bilat shoulder pain    Preferred imaging location?:   [facility] Shoulder Right    Standing Status:   Future  Number of Occurrences:   1    Expiration Date:   08/31/2024    Reason for Exam (SYMPTOM  OR DIAGNOSIS REQUIRED):   bilat shoulder pain    Preferred imaging location?:   [facility]   DG Shoulder Left    Standing Status:   Future    Number of Occurrences:   1    Expiration Date:   10/02/2023    Reason for Exam (SYMPTOM  OR DIAGNOSIS REQUIRED):   bilat shoulder pain    Preferred imaging location?:   [facility]   DG Knee AP/LAT W/[place] Left    Standing Status:   Future    Number of  Occurrences:   1    Expiration Date:   10/02/2023    Reason for Exam (SYMPTOM  OR DIAGNOSIS REQUIRED):   left knee pain    Preferred imaging location?:   [facility]   Ambulatory referral to Physical Therapy    Referral Priority:   Routine    Referral Type:   Physical Medicine    Referral Reason:   Specialty Services Required    Requested Specialty:   Physical Therapy    Number of Visits Requested:   1   No orders of the defined types were placed in this encounter.    Discussed warning signs or symptoms. Please see discharge instructions. Patient expresses understanding.   The above documentation has been reviewed and is accurate and complete Garlan Juniper, M.D.

## 2023-09-01 NOTE — Patient Instructions (Signed)
PLEASE KEEP YOUR BANDAGES ON AS LONG AS POSSIBLE TO GET THE BEST SWELLING REDUCTION. ?Should your bandages become uncomfortable or feel too tight, follow these steps: ?Elevate your extremity higher than your heart.  ?Try to move your arm or leg joints against the firmness of the bandage to help with moving the fluid and allow the bandages to loosen a bit.  ?If the bandaging is still is too tight, it is ok to carefully remove the top layer.  There will still be more layers under it that can provide compression to your extremity. ?Finally, if you STILL have significant pain after trying these steps, it is ok to take the bandage off.  Check your skin carefully for any signs of irritation  ?PLEASE bring ALL bandage materials back to your next appointment as we will reuse what we can ?TAKE CARE OF YOUR BANDAGES SO THEY WILL LAST LONGER AND STAY IN BETTER CONDITION ?Washing bandages:  Wash periodically using a mild detergent in warm water.  Do not use fabric softener or bleach.  Place bandages in a mesh lingerie bag or in a tied off pillow case and use the gentle cycle of the washing machine or hand wash. If you hand wash, you may want to put them in the spin cycle of your washer to get the extra water out, but make sure you put them in a mesh bag first. Do not wring or stretch them while they are wet.  ?Drying bandages: Lay the bandages out smoothly on a towel away from direct sunlight or heating sources that can damage the fabric. Rolling bandages in a towel and gently squeezing the towel to remove excess water before laying them out can speed up the process.  If you use a drying rack, place a towel on top of the rack to lay the bandages on.  If they hang down to dry, they fabric could be stretched out and the bandage will lose its compression.   Or, keep bandages in the mesh bag and dry them in the dryer on the low or no heat cycle. ?Rolling bandages: Please roll your bandages after drying them so they are ready for  your next treatment. If they are rolled too loose, they will be difficult to apply.  If rolled too tight, they can get stretched out.  ? ?TAKE CARE OF YOUR SKIN ?Apply a low pH moisturizing lotion to your skin daily ?Avoid scratching your skin ?Treat skin irritations quickly  ?Know the 5 warning signs of infection: redness, pain, warmth to touch, fever and increased swelling.  Call your physician immediately if you notice any of these signs of a possible infection. ? ?

## 2023-09-01 NOTE — Patient Instructions (Addendum)
 Thank you for coming in today.   Please get an Xray today before you leave   I've referred you to Physical Therapy.  Let us know if you don't hear from them in one week.   Check back in 6 weeks

## 2023-09-01 NOTE — Therapy (Signed)
 OUTPATIENT PHYSICAL THERAPY  UPPER EXTREMITY ONCOLOGY TREATMENT  Patient Name: Tami Schmidt MRN: [id number] DOB:Oct 24, 1932, 89 y.o., female Today's Date: 09/01/2023  END OF SESSION:  PT End of Session - 09/01/23 1407     Visit Number 2    Number of Visits 12    Date for PT Re-Evaluation 10/08/23    Authorization Type [employer]    PT Start Time 1408   late   PT Stop Time 1455    PT Time Calculation (min) 47 min    Activity Tolerance Patient tolerated treatment well    Behavior During Therapy WFL for tasks assessed/performed             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   Diabetes mellitus    fasting 90-100   HTN (hypertension) 03/25/2013   Hx of radiation therapy    breasts hx   Hypercholesterolemia    Hypertension    Lymphedema of arm    left arm   Pulmonary embolism (HCC)    Type II or unspecified type diabetes mellitus without mention of complication, not stated as uncontrolled 03/25/2013   Past Surgical History:  Procedure Laterality Date   ABDOMINAL HYSTERECTOMY     ANKLE SURGERY Right 1995   APPENDECTOMY     BIOPSY  01/07/2018   Procedure: BIOPSY;  Surgeon: Genell Ken, MD;  Location: [facility];  Service: Gastroenterology;;   BREAST SURGERY Bilateral    mastectomy   COLONOSCOPY WITH PROPOFOL  N/A 01/07/2018   Procedure: COLONOSCOPY WITH PROPOFOL ;  Surgeon: Genell Ken, MD;  Location: [facility];  Service: Gastroenterology;  Laterality: N/A;   ESOPHAGOGASTRODUODENOSCOPY (EGD) WITH PROPOFOL  N/A 01/06/2018   Procedure: ESOPHAGOGASTRODUODENOSCOPY (EGD) WITH PROPOFOL ;  Surgeon: Genell Ken, MD;  Location: [facility];  Service: Gastroenterology;  Laterality: N/A;   HERNIA REPAIR  04-26-2010   IR IMAGING GUIDED PORT INSERTION  10/29/2021   MASS EXCISION Left 01/26/2013   Procedure: EXCISION LEFT CHEST WALL MASS AND LEFT  ABDOMNAL WALL MASS;  Surgeon: Rogena Class, MD;  Location: [facility];  Service: General;  Laterality: Left;   MASS EXCISION Left 09/20/2014   Procedure: EXCISION OF LEFT CHEST WALL MASS;  Surgeon: Oza Blumenthal, MD;  Location: [facility];  Service: General;  Laterality: Left;   TEE WITHOUT CARDIOVERSION N/A 12/08/2017   Procedure: TRANSESOPHAGEAL ECHOCARDIOGRAM (TEE);  Surgeon: Lenise Quince, MD;  [facility];  Service: Cardiovascular;  Laterality: N/A;   TOTAL HIP ARTHROPLASTY Right 01/05/2017   TOTAL HIP ARTHROPLASTY Right 01/05/2017   Procedure: TOTAL HIP ARTHROPLASTY ANTERIOR APPROACH;  Surgeon: Wendolyn Hamburger, MD;  Location: [facility];  Service: Orthopedics;  Laterality: Right;   Patient Active Problem List   Diagnosis Date Noted   Acute lower GI bleeding 01/07/2023   Rectal bleeding 01/05/2023   Acute GI bleeding 10/04/2022   Port-A-Cath in place 06/12/2022   Loss of perception for taste 03/17/2022   Primary osteoarthritis involving multiple joints 03/17/2022   Genetic testing 07/24/2021   Microcytosis 04/29/2021   Hypokalemia 04/28/2021   Pulmonary embolism (HCC)    Agnosia 08/09/2020   Hardening of the aorta (main artery of the heart) (HCC) 08/09/2020   Morbid obesity (HCC) 08/09/2020   Neuropathy 08/09/2020   Primary osteoarthritis 08/09/2020   Sensorineural hearing loss (SNHL) of both ears 08/07/2020  Anosmia 06/05/2020   Tinnitus, bilateral 06/05/2020   GI bleed 07/13/2019   ABLA (acute blood loss anemia) 07/12/2019   Microcytic anemia 01/05/2018   Melena 01/04/2018   Lower GI bleed 01/04/2018   Diabetes mellitus type II, controlled, with no complications (HCC) 12/05/2017   Tenosynovitis of left wrist 12/02/2017   Infection of left wrist (HCC) 11/30/2017   Primary osteoarthritis of right hip 01/05/2017   Osteoarthritis of right hip 01/03/2017   Lymphedema of upper extremity 01/17/2014   Osteopenia 01/17/2014   Central centrifugal scarring  alopecia 08/02/2013   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   Dilated pore of [place]iner 08/02/2013   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 08/02/2013   Scar 08/02/2013   Malignant neoplasm of lower-inner quadrant of left breast in female, estrogen receptor positive (HCC) 06/16/2013   Type II or unspecified type diabetes mellitus without mention of complication, not stated as uncontrolled 03/25/2013   Essential hypertension 03/25/2013   Chest wall recurrence of breast cancer (HCC) 02/21/2013   Abdominal wall mass 01/14/2013    PCP:   REFERRING PROVIDER: Alwin Baars, NP  REFERRING DIAG: Lymphedema s/p breast cancer  THERAPY DIAG:  Lymphedema, not elsewhere classified  Aftercare following surgery for neoplasm  ONSET DATE: 2014 with exacerbation  Rationale for Evaluation and Treatment: Rehabilitation  SUBJECTIVE:                                                                                                                                                                                           SUBJECTIVE STATEMENT:  09/01/2023 I have not used my pump since I was here last.  EVAL The lymphedema in my arm seems to be worse and the NP wanted me to come see you. She doesn't wear her sleeve much because it doesn't really compress. The night garment is still too  big also. I have not been wrapped or in the sleeve for about 6 months. She also has a Flexi touch that she uses every once in a while.  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 Pts husband has wrapped  before and she has a night garment  PAIN:  Are you having pain? No  PRECAUTIONS: right THR and diabetes, prior PE , significant OA bilateral shoulders, knees, left lumpectomy; no LN's RED FLAGS: None   WEIGHT BEARING RESTRICTIONS: No, but she uses a cane for knee OA  FALLS:  Has patient fallen in last 6 months? 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  COGNITION: Overall cognitive status: Within functional limits for tasks assessed   PALPATION: Significant fibrosis noted left forearm, mild pitting noted dorsum of hand and ulnar border of forearm  OBSERVATIONS / OTHER ASSESSMENTS: pt uses SPC, antalgic gait on left  SENSATION: Light touch:    POSTURE: forward head, rounded shoulders  UPPER EXTREMITY AROM/PROM:  A/PROM RIGHT   eval   Shoulder extension   Shoulder flexion 80(OA)  Shoulder abduction   Shoulder internal rotation   Shoulder external rotation     (Blank rows = not tested)  A/PROM LEFT   eval  Shoulder extension   Shoulder flexion 60 (OA)  Shoulder abduction   Shoulder internal rotation   Shoulder external rotation     (Blank rows = not tested)  CERVICAL AROM: All within functional limits:     UPPER EXTREMITY STRENGTH:    LYMPHEDEMA ASSESSMENTS:   SURGERY TYPE/DATE:  1993: Right lumpectomy axillary dissection in [place]  (stage I) treated with radiation and tamoxifen  x 5 years 2002: Right mastectomy: Right breast recurrence 2004: Left mastectomy: 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 Developed lymphedema after this    2024 reoccurence LN's; had chemotherapy NUMBER OF LYMPH NODES REMOVED: ?  CHEMOTHERAPY: YES, last reoccurence  RADIATION:YES  HORMONE TREATMENT: YES,Neratinib /tamoxifen    INFECTIONS: NO   LYMPHEDEMA ASSESSMENTS:   LANDMARK RIGHT  eval  At axilla  30.3  15 cm proximal to olecranon process 28.3  10 cm proximal to olecranon process 26.7  Olecranon process 22.4  15 cm proximal to ulnar styloid process 20  10 cm proximal to ulnar styloid process 17.3  Just proximal to ulnar styloid process 15.1  Across hand at thumb web space 19.2  At base of 2nd digit 5.95  (Blank rows = not tested)  LANDMARK LEFT  eval  At axilla  36.0  15 cm proximal to olecranon process 35.1  10 cm proximal to olecranon process 35.0  Olecranon process 30.4  15 cm proximal to ulnar styloid process 29.8  10 cm proximal to ulnar styloid process 26.8  Just proximal to ulnar styloid process 23.4  Across hand at thumb web space 20.6  At base of 2nd digit 6.4  (Blank rows = not tested)   FUNCTIONAL TESTS:    GAIT: Distance walked: front desk to room 8 Assistive device utilized: Single point cane Level of assistance: Modified independence Comments: antalgic gait  LYMPHEDEMA LIFE IMPACT SCALE: 19  TREATMENT DATE:   09/01/2023 In supine: Short neck, 5 diaphragmatic breaths, R axillary nodes and establishment of interaxillary pathway, L inguinal nodes and establishment of  axilloinguinal pathway, then L UE working proximal to distal, moving fluid from upper inner arm outwards, and doing both sides of forearm moving fluid towards pathways spending extra time in any areas of fibrosis then retracing all steps  Compression bandaging;applied lotion, Juzo undersleeve, 15 cm artiflex hand to axilla, 6 cm wrap to hand, 2 (12 cm) wraps in spiral wrist to axilla. 1st wrap with X at elbow. Instructed pt in remedial exercises, and gave written instructions for when to remove wraps and laundering instructions. Reiterated that wraps need to come off if having pain, numbness etc or if they slide since it is 3 days before she returns.    08/27/2023 See education section   PATIENT EDUCATION:  Education details: LOS, POC, treatment interventions Person educated: Patient Education method: Explanation Education comprehension: verbalized understanding  HOME EXERCISE PROGRAM:   ASSESSMENT:  CLINICAL IMPRESSION: Patient is an 89 y.o. female who was seen today for physical therapy evaluation and treatment for exacerbation of left UE lymphedema. Pt has a Flexi touch at home that she uses some, but notes that her compression sleeve is too stretched out and her night garment has always been too big. Her Left UE has significant fibrosis, as well as mild pitting throughout the dorsum of the hand and forearm. Her left arm is  6 cm or greater in multiple places versus the right. She will benefit from CDT to reduce lymphedema, and to assist with purchasing the correct day and night garments. Pt said she can only come 2x/week, but her husband witll also wrap her at home when needed. She was told he should come in with her to review before he starts to do at home.   OBJECTIVE IMPAIRMENTS: decreased activity tolerance, decreased knowledge of condition, decreased mobility, decreased ROM, decreased strength, increased edema, impaired UE functional use, and postural dysfunction.   ACTIVITY  LIMITATIONS: carrying, lifting, standing, stairs, reach over head, and hygiene/grooming  PARTICIPATION LIMITATIONS: cleaning, shopping, and community activity  PERSONAL FACTORS: 3+ comorbidities: Left Breast Cancer with re;occurrence, radiation, chemotherapy, Lymphedema are also affecting patient's functional outcome.   REHAB POTENTIAL: Good  CLINICAL DECISION MAKING: Stable/uncomplicated  EVALUATION COMPLEXITY: Low  GOALS: Goals reviewed with patient? Yes  SHORT TERM GOALS: Target date: 09/17/2023  Pts husband will be independent with compression bandaging Baseline: Goal status: INITIAL  2.  Pt will have 2 cm reduction at 10 cm above ulna styloid process Baseline:  Goal status: INITIAL  3.  Pt will have 2 cm reduction prox to olecranon for improved fit of clothing Baseline:  Goal status: INITIAL  4.  Pt will be tolerant of compression bandaging Baseline:  Goal status: INITIAL   LONG TERM GOALS: Target date: 10/08/2023  Pt will have decreased edema at 10 cm prox to ulna styloid process by 3 cm or greater for better fit of clothing Baseline:  Goal status: INITIAL  2.  Pt will have decreased edema at 10 cm prox to olecranon by 3 cm or greater for improved clothig fit Baseline:  Goal status: INITIAL  3.  Pt will have appropriate day and night garments to control left UE lymphedema Baseline:  Goal status: INITIAL  4.  Pt/husband will be independent in management of lymphedema Baseline:  Goal status: INITIAL   PLAN:  PT FREQUENCY: 2x/week  PT DURATION: 6 weeks  PLANNED INTERVENTIONS:  16109- PT Re-evaluation, 97110-Therapeutic exercises, 97530- Therapeutic activity, 97535- Self Care, 60454- Manual therapy, [id number]- Orthotic Initial, [id number]- Orthotic/Prosthetic subsequent, Patient/Family education, and Manual lymph drainage  PLAN FOR NEXT SESSION: Initiate CDT ,if husband present practice bandaging.How was wrap?, apply foam prn Pt has Flexi touch at home; encourage use  before her husband wraps.  Latisha Poland, PT 09/01/2023, 2:58 PM

## 2023-09-04 ENCOUNTER — Ambulatory Visit

## 2023-09-04 DIAGNOSIS — I890 Lymphedema, not elsewhere classified: Secondary | ICD-10-CM | POA: Diagnosis not present

## 2023-09-04 DIAGNOSIS — Z483 Aftercare following surgery for neoplasm: Secondary | ICD-10-CM

## 2023-09-04 NOTE — Therapy (Signed)
 OUTPATIENT PHYSICAL THERAPY  UPPER EXTREMITY ONCOLOGY TREATMENT  Patient Name: Tami Schmidt MRN: [id number] DOB:06/23/1932, 89 y.o., female Today's Date: 09/04/2023  END OF SESSION:  PT End of Session - 09/04/23 0815     Visit Number 3    Number of Visits 12    Date for PT Re-Evaluation 10/08/23    Authorization Type [employer]    PT Start Time 0802    PT Stop Time 0905    PT Time Calculation (min) 63 min    Activity Tolerance Patient tolerated treatment well    Behavior During Therapy WFL for tasks assessed/performed             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   Diabetes mellitus    fasting 90-100   HTN (hypertension) 03/25/2013   Hx of radiation therapy    breasts hx   Hypercholesterolemia    Hypertension    Lymphedema of arm    left arm   Pulmonary embolism (HCC)    Type II or unspecified type diabetes mellitus without mention of complication, not stated as uncontrolled 03/25/2013   Past Surgical History:  Procedure Laterality Date   ABDOMINAL HYSTERECTOMY     ANKLE SURGERY Right 1995   APPENDECTOMY     BIOPSY  01/07/2018   Procedure: BIOPSY;  Surgeon: Genell Ken, MD;  Location: [facility];  Service: Gastroenterology;;   BREAST SURGERY Bilateral    mastectomy   COLONOSCOPY WITH PROPOFOL  N/A 01/07/2018   Procedure: COLONOSCOPY WITH PROPOFOL ;  Surgeon: Genell Ken, MD;  Location: [facility];  Service: Gastroenterology;  Laterality: N/A;   ESOPHAGOGASTRODUODENOSCOPY (EGD) WITH PROPOFOL  N/A 01/06/2018   Procedure: ESOPHAGOGASTRODUODENOSCOPY (EGD) WITH PROPOFOL ;  Surgeon: Genell Ken, MD;  Location: [facility];  Service: Gastroenterology;  Laterality: N/A;   HERNIA REPAIR  04-26-2010   IR IMAGING GUIDED PORT INSERTION  10/29/2021   MASS EXCISION Left 01/26/2013   Procedure: EXCISION LEFT CHEST WALL MASS AND LEFT  ABDOMNAL WALL MASS;  Surgeon: Rogena Class, MD;  Location: [facility];  Service: General;  Laterality: Left;   MASS EXCISION Left 09/20/2014   Procedure: EXCISION OF LEFT CHEST WALL MASS;  Surgeon: Oza Blumenthal, MD;  Location: [facility];  Service: General;  Laterality: Left;   TEE WITHOUT CARDIOVERSION N/A 12/08/2017   Procedure: TRANSESOPHAGEAL ECHOCARDIOGRAM (TEE);  Surgeon: Lenise Quince, MD;  Location: [facility];  Service: Cardiovascular;  Laterality: N/A;   TOTAL HIP ARTHROPLASTY Right 01/05/2017   TOTAL HIP ARTHROPLASTY Right 01/05/2017   Procedure: TOTAL HIP ARTHROPLASTY ANTERIOR APPROACH;  Surgeon: Wendolyn Hamburger, MD;  Location: [facility];  Service: Orthopedics;  Laterality: Right;   Patient Active Problem List   Diagnosis Date Noted   Acute lower GI bleeding 01/07/2023   Rectal bleeding 01/05/2023   Acute GI bleeding 10/04/2022   Port-A-Cath in place 06/12/2022   Loss of perception for taste 03/17/2022   Primary osteoarthritis involving multiple joints 03/17/2022   Genetic testing 07/24/2021   Microcytosis 04/29/2021   Hypokalemia 04/28/2021   Pulmonary embolism (HCC)    Agnosia 08/09/2020   Hardening of the aorta (main artery of the heart) (HCC) 08/09/2020   Morbid obesity (HCC) 08/09/2020   Neuropathy 08/09/2020   Primary osteoarthritis 08/09/2020   Sensorineural hearing loss (SNHL) of both ears 08/07/2020   Anosmia  06/05/2020   Tinnitus, bilateral 06/05/2020   GI bleed 07/13/2019   ABLA (acute blood loss anemia) 07/12/2019   Microcytic anemia 01/05/2018   Melena 01/04/2018   Lower GI bleed 01/04/2018   Diabetes mellitus type II, controlled, with no complications (HCC) 12/05/2017   Tenosynovitis of left wrist 12/02/2017   Infection of left wrist (HCC) 11/30/2017   Primary osteoarthritis of right hip 01/05/2017   Osteoarthritis of right hip 01/03/2017   Lymphedema of upper extremity 01/17/2014   Osteopenia 01/17/2014   Central centrifugal scarring  alopecia 08/02/2013   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   Dilated pore of Winer 08/02/2013   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 08/02/2013   Scar 08/02/2013   Malignant neoplasm of lower-inner quadrant of left breast in female, estrogen receptor positive (HCC) 06/16/2013   Type II or unspecified type diabetes mellitus without mention of complication, not stated as uncontrolled 03/25/2013   Essential hypertension 03/25/2013   Chest wall recurrence of breast cancer (HCC) 02/21/2013   Abdominal wall mass 01/14/2013    PCP:   REFERRING PROVIDER: Alwin Baars, NP  REFERRING DIAG: Lymphedema s/p breast cancer  THERAPY DIAG:  Lymphedema, not elsewhere classified  Aftercare following surgery for neoplasm  ONSET DATE: 2014 with exacerbation  Rationale for Evaluation and Treatment: Rehabilitation  SUBJECTIVE:                                                                                                                                                                                           SUBJECTIVE STATEMENT:  I didn't have any trouble with the bandages.   EVAL The lymphedema in my arm seems to be worse and the NP wanted me to come see you. She doesn't wear her sleeve much because it doesn't really compress. The night garment is still too  big also. I have not been wrapped or in the sleeve for about 6 months. She also has a Flexi touch that she uses every once in a while.  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 Pts husband has wrapped before and she has  a night garment  PAIN:  Are you having pain? No  PRECAUTIONS: right THR and diabetes, prior PE , significant OA bilateral shoulders, knees, left lumpectomy; no LN's RED FLAGS: None   WEIGHT BEARING RESTRICTIONS: No, but she uses a cane for knee OA  FALLS:  Has patient fallen in last 6 months? 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  COGNITION: Overall cognitive status: Within functional limits for tasks assessed   PALPATION: Significant fibrosis noted left forearm, mild pitting noted dorsum of hand and ulnar border of forearm  OBSERVATIONS / OTHER ASSESSMENTS: pt uses SPC, antalgic gait on left  SENSATION: Light touch:    POSTURE: forward head, rounded shoulders  UPPER EXTREMITY AROM/PROM:  A/PROM RIGHT   eval   Shoulder extension   Shoulder flexion 80(OA)  Shoulder abduction   Shoulder internal rotation   Shoulder external rotation     (Blank rows = not tested)  A/PROM LEFT   eval  Shoulder extension   Shoulder flexion 60 (OA)  Shoulder abduction   Shoulder internal rotation   Shoulder external rotation     (Blank rows = not tested)  CERVICAL AROM: All within functional limits:     UPPER EXTREMITY STRENGTH:   LYMPHEDEMA  ASSESSMENTS:   SURGERY TYPE/DATE:  1993: Right lumpectomy axillary dissection in [place]  (stage I) treated with radiation and tamoxifen  x 5 years 2002: Right mastectomy: Right breast recurrence 2004: Left mastectomy: 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 Developed lymphedema after this    2024 reoccurence LN's; had chemotherapy NUMBER OF LYMPH NODES REMOVED: ?  CHEMOTHERAPY: YES, last reoccurence  RADIATION:YES  HORMONE TREATMENT: YES,Neratinib /tamoxifen    INFECTIONS: NO   LYMPHEDEMA ASSESSMENTS:   LANDMARK RIGHT  eval  At axilla  30.3  15 cm proximal to olecranon process 28.3  10 cm proximal to olecranon process 26.7  Olecranon process 22.4  15 cm proximal to ulnar styloid process 20  10 cm proximal to ulnar styloid process 17.3  Just proximal to ulnar styloid process 15.1  Across hand at thumb web space 19.2  At base of 2nd digit 5.95  (Blank rows = not tested)  LANDMARK LEFT  eval  At axilla  36.0  15 cm proximal to olecranon process 35.1  10 cm proximal to olecranon process 35.0  Olecranon process 30.4  15 cm proximal to ulnar styloid process 29.8  10 cm proximal to ulnar styloid process 26.8  Just proximal to ulnar styloid process 23.4  Across hand at thumb web space 20.6  At base of 2nd digit 6.4  (Blank rows = not tested)   FUNCTIONAL TESTS:    GAIT: Distance walked: front desk to room 8 Assistive device utilized: Single point cane Level of assistance: Modified independence Comments: antalgic gait  LYMPHEDEMA LIFE IMPACT SCALE: 19  TREATMENT DATE:  09/04/23: Manual Therapy Removed bandages while assessing skin which looks great today with no areas of irritation. Then pt washed her arm with soap and water. MLD to Lt UE as  follows: Short neck, superficial and deep abdominals, Lt inguinal nodes, Lt axillo-inguinal anastomosis, Rt axillary and pectoral nodes, anterior inter-axillary anastomosis, then focused on Lt UE working from proximal to distal then retracing all steps back to anastomosis. Compression Bandaging to Lt UE: Cocoa butter, juzo soft, Molelast to fingers 1-4, Artiflex x 1 from hand to axilla, then short stretch compression bandages: 1-6 cm to hand/wrist, 1-12 cm spiral with "X" at elbow, and 1-12 cm spiral from wrist to axilla.  Issued TG soft size med for pt to wear once she removes her bandages on Sunday due to long holiday weekend.   09/01/2023 In supine: Short neck, 5 diaphragmatic breaths, R axillary nodes and establishment of interaxillary pathway, L inguinal nodes and establishment of axilloinguinal pathway, then L UE working proximal to distal, moving fluid from upper inner arm outwards, and doing both sides of forearm moving fluid towards pathways spending extra time in any areas of fibrosis then retracing all steps  Compression bandaging;applied lotion, Juzo undersleeve, 15 cm artiflex hand to axilla, 6 cm wrap to hand, 2 (12 cm) wraps in spiral wrist to axilla. 1st wrap with X at elbow. Instructed pt in remedial exercises, and gave written instructions for when to remove wraps and laundering instructions. Reiterated that wraps need to come off if having pain, numbness etc or if they slide since it is 3 days before she returns.    08/27/2023 See education section   PATIENT EDUCATION:  Education details: LOS, POC, treatment interventions Person educated: Patient Education method: Explanation Education comprehension: verbalized understanding  HOME EXERCISE PROGRAM:   ASSESSMENT:  CLINICAL IMPRESSION: Pt tolerated first bandage very well so continued with CDT of Lt UE today. Issued TG soft for her to wear once bandages are removed due to holiday weekend. Also encouraged her to resume use of  her pump when her bandages are off. Pt verbalized understanding.    OBJECTIVE IMPAIRMENTS: decreased activity tolerance, decreased knowledge of condition, decreased mobility, decreased ROM, decreased strength, increased edema, impaired UE functional use, and postural dysfunction.   ACTIVITY LIMITATIONS: carrying, lifting, standing, stairs, reach over head, and hygiene/grooming  PARTICIPATION LIMITATIONS: cleaning, shopping, and community activity  PERSONAL FACTORS: 3+ comorbidities: Left Breast Cancer with re;occurrence, radiation, chemotherapy, Lymphedema are also affecting patient's functional outcome.   REHAB POTENTIAL: Good  CLINICAL DECISION MAKING: Stable/uncomplicated  EVALUATION COMPLEXITY: Low  GOALS: Goals reviewed with patient? Yes  SHORT TERM GOALS: Target date: 09/17/2023  Pts husband will be independent with compression bandaging Baseline: Goal status: INITIAL  2.  Pt will have 2 cm reduction at 10 cm above ulna styloid process Baseline:  Goal status: INITIAL  3.  Pt will have 2 cm reduction prox to olecranon for improved fit of clothing Baseline:  Goal status: INITIAL  4.  Pt will be tolerant of compression bandaging Baseline:  Goal status: INITIAL   LONG TERM GOALS: Target date: 10/08/2023  Pt will have decreased edema at 10 cm prox to ulna styloid process by 3 cm or greater for better fit of clothing Baseline:  Goal status: INITIAL  2.  Pt will have decreased edema at 10 cm prox to olecranon by 3 cm or greater for improved clothig fit Baseline:  Goal status: INITIAL  3.  Pt will have appropriate day and night  garments to control left UE lymphedema Baseline:  Goal status: INITIAL  4.  Pt/husband will be independent in management of lymphedema Baseline:  Goal status: INITIAL   PLAN:  PT FREQUENCY: 2x/week  PT DURATION: 6 weeks  PLANNED INTERVENTIONS: 97164- PT Re-evaluation, 97110-Therapeutic exercises, 97530- Therapeutic activity, 97535-  Self Care, 16109- Manual therapy, 97760- Orthotic Initial, [id number]- Orthotic/Prosthetic subsequent, Patient/Family education, and Manual lymph drainage  PLAN FOR NEXT SESSION: Cont CDT, if husband present practice bandaging. Apply foam prn, Pt has Flexi touch at home; encourage use before her husband wraps.  Denyce Flank, PTA 09/04/2023, 10:02 AM

## 2023-09-08 ENCOUNTER — Ambulatory Visit: Payer: Self-pay | Admitting: Family Medicine

## 2023-09-08 NOTE — Progress Notes (Signed)
 Right shoulder x-ray shows advanced arthritis.

## 2023-09-08 NOTE — Progress Notes (Signed)
Left knee x-ray shows medium arthritis.

## 2023-09-08 NOTE — Progress Notes (Signed)
Left shoulder x-ray shows severe arthritis.

## 2023-09-09 ENCOUNTER — Ambulatory Visit

## 2023-09-09 DIAGNOSIS — Z483 Aftercare following surgery for neoplasm: Secondary | ICD-10-CM

## 2023-09-09 DIAGNOSIS — I890 Lymphedema, not elsewhere classified: Secondary | ICD-10-CM

## 2023-09-09 NOTE — Therapy (Signed)
 OUTPATIENT PHYSICAL THERAPY  UPPER EXTREMITY ONCOLOGY TREATMENT  Patient Name: Tami Schmidt MRN: [id number] DOB:07-23-1932, 89 y.o., female Today's Date: 09/09/2023  END OF SESSION:  PT End of Session - 09/09/23 0910     Visit Number 4    Number of Visits 12    Date for PT Re-Evaluation 10/08/23    Authorization Type [employer]    PT Start Time 0905    PT Stop Time 0958    PT Time Calculation (min) 53 min    Activity Tolerance Patient tolerated treatment well    Behavior During Therapy [facility] for tasks assessed/performed             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   Diabetes mellitus    fasting 90-100   HTN (hypertension) 03/25/2013   Hx of radiation therapy    breasts hx   Hypercholesterolemia    Hypertension    Lymphedema of arm    left arm   Pulmonary embolism (HCC)    Type II or unspecified type diabetes mellitus without mention of complication, not stated as uncontrolled 03/25/2013   Past Surgical History:  Procedure Laterality Date   ABDOMINAL HYSTERECTOMY     ANKLE SURGERY Right 1995   APPENDECTOMY     BIOPSY  01/07/2018   Procedure: BIOPSY;  Surgeon: Genell Ken, MD;  Location: [facility];  Service: Gastroenterology;;   BREAST SURGERY Bilateral    mastectomy   COLONOSCOPY WITH PROPOFOL  N/A 01/07/2018   Procedure: COLONOSCOPY WITH PROPOFOL ;  Surgeon: Genell Ken, MD;  Location: [facility];  Service: Gastroenterology;  Laterality: N/A;   ESOPHAGOGASTRODUODENOSCOPY (EGD) WITH PROPOFOL  N/A 01/06/2018   Procedure: ESOPHAGOGASTRODUODENOSCOPY (EGD) WITH PROPOFOL ;  Surgeon: Genell Ken, MD;  Location: [facility];  Service: Gastroenterology;  Laterality: N/A;   HERNIA REPAIR  04-26-2010   IR IMAGING GUIDED PORT INSERTION  10/29/2021   MASS EXCISION Left 01/26/2013   Procedure: EXCISION LEFT CHEST WALL MASS AND LEFT  ABDOMNAL WALL MASS;  Surgeon: Rogena Class, MD;  Location: [facility];  Service: General;  Laterality: Left;   MASS EXCISION Left 09/20/2014   Procedure: EXCISION OF LEFT CHEST WALL MASS;  Surgeon: Oza Blumenthal, MD;  Location: [facility];  Service: General;  Laterality: Left;   TEE WITHOUT CARDIOVERSION N/A 12/08/2017   Procedure: TRANSESOPHAGEAL ECHOCARDIOGRAM (TEE);  Surgeon: Lenise Quince, MD;  Location: [facility];  Service: Cardiovascular;  Laterality: N/A;   TOTAL HIP ARTHROPLASTY Right 01/05/2017   TOTAL HIP ARTHROPLASTY Right 01/05/2017   Procedure: TOTAL HIP ARTHROPLASTY ANTERIOR APPROACH;  Surgeon: Wendolyn Hamburger, MD;  Location: [facility] OR;  Service: Orthopedics;  Laterality: Right;   Patient Active Problem List   Diagnosis Date Noted   Acute lower GI bleeding 01/07/2023   Rectal bleeding 01/05/2023   Acute GI bleeding 10/04/2022   Port-A-Cath in place 06/12/2022   Loss of perception for taste 03/17/2022   Primary osteoarthritis involving multiple joints 03/17/2022   Genetic testing 07/24/2021   Microcytosis 04/29/2021   Hypokalemia 04/28/2021   Pulmonary embolism (HCC)    Agnosia 08/09/2020   Hardening of the aorta (main artery of the heart) (HCC) 08/09/2020   Morbid obesity (HCC) 08/09/2020   Neuropathy 08/09/2020   Primary osteoarthritis 08/09/2020   Sensorineural hearing loss (SNHL) of both ears 08/07/2020   Anosmia  06/05/2020   Tinnitus, bilateral 06/05/2020   GI bleed 07/13/2019   ABLA (acute blood loss anemia) 07/12/2019   Microcytic anemia 01/05/2018   Melena 01/04/2018   Lower GI bleed 01/04/2018   Diabetes mellitus type II, controlled, with no complications (HCC) 12/05/2017   Tenosynovitis of left wrist 12/02/2017   Infection of left wrist (HCC) 11/30/2017   Primary osteoarthritis of right hip 01/05/2017   Osteoarthritis of right hip 01/03/2017   Lymphedema of upper extremity 01/17/2014   Osteopenia 01/17/2014   Central centrifugal scarring  alopecia 08/02/2013   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   Dilated pore of Winer 08/02/2013   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 08/02/2013   Scar 08/02/2013   Malignant neoplasm of lower-inner quadrant of left breast in female, estrogen receptor positive (HCC) 06/16/2013   Type II or unspecified type diabetes mellitus without mention of complication, not stated as uncontrolled 03/25/2013   Essential hypertension 03/25/2013   Chest wall recurrence of breast cancer (HCC) 02/21/2013   Abdominal wall mass 01/14/2013    PCP:   REFERRING PROVIDER: Alwin Baars, NP  REFERRING DIAG: Lymphedema s/p breast cancer  THERAPY DIAG:  Lymphedema, not elsewhere classified  Aftercare following surgery for neoplasm  ONSET DATE: 2014 with exacerbation  Rationale for Evaluation and Treatment: Rehabilitation  SUBJECTIVE:                                                                                                                                                                                           SUBJECTIVE STATEMENT:  I left them on until yesterday and I used my pump. I'm not having any problems with the bandages.   EVAL The lymphedema in my arm seems to be worse and the NP wanted me to come see you. She doesn't wear her sleeve much because it doesn't really compress. The night garment is still too  big also. I have not been wrapped or in the sleeve for about 6 months. She also has a Flexi touch that she uses every once in a while.  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 Pts husband has wrapped before and she has a night garment  PAIN:  Are you having pain? No  PRECAUTIONS: right THR and diabetes, prior PE , significant OA bilateral shoulders, knees, left lumpectomy; no LN's RED FLAGS: None   WEIGHT BEARING RESTRICTIONS: No, but she uses a cane for knee OA  FALLS:  Has patient fallen in last 6 months? 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  COGNITION: Overall cognitive status: Within functional limits for tasks assessed   PALPATION: Significant fibrosis noted left forearm, mild pitting noted dorsum of hand and ulnar border of forearm  OBSERVATIONS / OTHER ASSESSMENTS: pt uses SPC, antalgic gait on left  SENSATION: Light touch:    POSTURE: forward head, rounded shoulders  UPPER EXTREMITY AROM/PROM:  A/PROM RIGHT   eval   Shoulder extension   Shoulder flexion 80(OA)  Shoulder abduction   Shoulder internal rotation   Shoulder external rotation     (Blank rows = not tested)  A/PROM LEFT   eval  Shoulder extension   Shoulder flexion 60 (OA)  Shoulder abduction   Shoulder internal rotation   Shoulder external rotation     (Blank rows = not tested)  CERVICAL AROM: All within functional limits:      UPPER EXTREMITY STRENGTH:   LYMPHEDEMA ASSESSMENTS:   SURGERY TYPE/DATE:  1993: Right lumpectomy axillary dissection in [place]  (stage I) treated with radiation and tamoxifen  x 5 years 2002: Right mastectomy: Right breast recurrence 2004: Left mastectomy: 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 Developed lymphedema after this    2024 reoccurence LN's; had chemotherapy NUMBER OF LYMPH NODES REMOVED: ?  CHEMOTHERAPY: YES, last reoccurence  RADIATION:YES  HORMONE TREATMENT: YES,Neratinib /tamoxifen    INFECTIONS: NO   LYMPHEDEMA ASSESSMENTS:   LANDMARK RIGHT  eval  At axilla  30.3  15 cm proximal to olecranon process 28.3  10 cm proximal to olecranon process 26.7  Olecranon process 22.4  15 cm proximal to ulnar styloid process 20  10 cm proximal to ulnar styloid process 17.3  Just proximal to ulnar styloid process 15.1  Across hand at thumb web space 19.2  At base of 2nd digit 5.95  (Blank rows = not tested)  LANDMARK LEFT  eval Left 09/09/23  At axilla  36.0 33.9  15 cm proximal to olecranon process 35.1 32.5  10 cm proximal to olecranon process 35.0 31.3  Olecranon process 30.4 27.6  15 cm proximal to ulnar styloid process 29.8 27.8  10 cm proximal to ulnar styloid process 26.8 25.7  Just proximal to ulnar styloid process 23.4 20.4  Across hand at thumb web space 20.6 18.8  At base of 2nd digit 6.4 6  (Blank rows = not tested)   FUNCTIONAL TESTS:    GAIT: Distance walked: front desk to room 8 Assistive device utilized: Single point cane Level of assistance: Modified independence Comments: antalgic gait  LYMPHEDEMA LIFE IMPACT SCALE: 19  TREATMENT DATE:  09/09/23: Circumference measurements taken MLD to Lt UE as follows: Short neck,  superficial and deep abdominals, Lt inguinal nodes, Lt axillo-inguinal anastomosis, then focused on Lt UE working from proximal to distal then retracing all steps back to anastomosis. Compression Bandaging to Lt UE: Cocoa butter, TG soft, Molelast to fingers 1-4, Artiflex x 1 from hand to axilla, then short stretch compression bandages: 1-6 cm to hand/wrist, 1-12 cm herringbone with "X" at elbow, and 1-12 cm spiral from wrist to axilla.   09/04/23: Manual Therapy Removed bandages while assessing skin which looks great today with no areas of irritation. Then pt washed her arm with soap and water. MLD to Lt UE as follows: Short neck, superficial and deep abdominals, Lt inguinal nodes, Lt axillo-inguinal anastomosis, Rt axillary and pectoral nodes, anterior inter-axillary anastomosis, then focused on Lt UE working from proximal to distal then retracing all steps back to anastomosis. Compression Bandaging to Lt UE: Cocoa butter, juzo soft, Molelast to fingers 1-4, Artiflex x 1 from hand to axilla, then short stretch compression bandages: 1-6 cm to hand/wrist, 1-12 cm spiral with "X" at elbow, and 1-12 cm spiral from wrist to axilla.  Issued TG soft size med for pt to wear once she removes her bandages on Sunday due to long holiday weekend.   09/01/2023 In supine: Short neck, 5 diaphragmatic breaths, R axillary nodes and establishment of interaxillary pathway, L inguinal nodes and establishment of axilloinguinal pathway, then L UE working proximal to distal, moving fluid from upper inner arm outwards, and doing both sides of forearm moving fluid towards pathways spending extra time in any areas of fibrosis then retracing all steps  Compression bandaging;applied lotion, Juzo undersleeve, 15 cm artiflex hand to axilla, 6 cm wrap to hand, 2 (12 cm) wraps in spiral wrist to axilla. 1st wrap with X at elbow. Instructed pt in remedial exercises, and gave written instructions for when to remove wraps and laundering  instructions. Reiterated that wraps need to come off if having pain, numbness etc or if they slide since it is 3 days before she returns.      PATIENT EDUCATION:  Education details: LOS, POC, treatment interventions Person educated: Patient Education method: Explanation Education comprehension: verbalized understanding  HOME EXERCISE PROGRAM:   ASSESSMENT:  CLINICAL IMPRESSION: Pt removed bandages as instructed due to long weekend. She resumed use of her compression pump and reports no problems with this. Circumference measurements taken and great reductions noted already, pt very pleased with this. Then continued with Lt UE CDT. Probable pt will be ready to measure June 18.    OBJECTIVE IMPAIRMENTS: decreased activity tolerance, decreased knowledge of condition, decreased mobility, decreased ROM, decreased strength, increased edema, impaired UE functional use, and postural dysfunction.   ACTIVITY LIMITATIONS: carrying, lifting, standing, stairs, reach over head, and hygiene/grooming  PARTICIPATION LIMITATIONS: cleaning, shopping, and community activity  PERSONAL FACTORS: 3+ comorbidities: Left Breast Cancer with re;occurrence, radiation, chemotherapy, Lymphedema are also affecting patient's functional outcome.   REHAB POTENTIAL: Good  CLINICAL DECISION MAKING: Stable/uncomplicated  EVALUATION COMPLEXITY: Low  GOALS: Goals reviewed with patient? Yes  SHORT TERM GOALS: Target date: 09/17/2023  Pts husband will be independent with compression bandaging Baseline: Goal status: INITIAL  2.  Pt will have 2 cm reduction at 10 cm above ulna styloid process Baseline:  Goal status: INITIAL  3.  Pt will have 2 cm reduction prox to olecranon for improved fit of clothing Baseline:  Goal status: INITIAL  4.  Pt will  be tolerant of compression bandaging Baseline:  Goal status: INITIAL   LONG TERM GOALS: Target date: 10/08/2023  Pt will have decreased edema at 10 cm prox to  ulna styloid process by 3 cm or greater for better fit of clothing Baseline:  Goal status: INITIAL  2.  Pt will have decreased edema at 10 cm prox to olecranon by 3 cm or greater for improved clothig fit Baseline:  Goal status: INITIAL  3.  Pt will have appropriate day and night garments to control left UE lymphedema Baseline:  Goal status: INITIAL  4.  Pt/husband will be independent in management of lymphedema Baseline:  Goal status: INITIAL   PLAN:  PT FREQUENCY: 2x/week  PT DURATION: 6 weeks  PLANNED INTERVENTIONS: 97164- PT Re-evaluation, 97110-Therapeutic exercises, 97530- Therapeutic activity, 97535- Self Care, 53664- Manual therapy, 97760- Orthotic Initial, [id number]- Orthotic/Prosthetic subsequent, Patient/Family education, and Manual lymph drainage  PLAN FOR NEXT SESSION: Cont CDT. Apply foam prn, Pt has Flexi touch at home; encourage use before her husband wraps.  Denyce Flank, PTA 09/09/2023, 10:03 AM

## 2023-09-14 ENCOUNTER — Telehealth: Payer: Self-pay | Admitting: *Deleted

## 2023-09-14 NOTE — Telephone Encounter (Signed)
 Pt called requesting phone number per need to schedule CT ( noted as Central Scheduling attempted to call her x 2 with no answer) - number given to pt

## 2023-09-15 ENCOUNTER — Ambulatory Visit: Attending: Adult Health

## 2023-09-15 DIAGNOSIS — I890 Lymphedema, not elsewhere classified: Secondary | ICD-10-CM | POA: Insufficient documentation

## 2023-09-15 DIAGNOSIS — Z483 Aftercare following surgery for neoplasm: Secondary | ICD-10-CM | POA: Diagnosis present

## 2023-09-15 NOTE — Therapy (Signed)
 OUTPATIENT PHYSICAL THERAPY  UPPER EXTREMITY ONCOLOGY TREATMENT  Patient Name: Tami Schmidt MRN: [id number] DOB:21-Apr-1932, 89 y.o., female Today's Date: 09/15/2023  END OF SESSION:  PT End of Session - 09/15/23 1112     Visit Number 5    Number of Visits 12    Date for PT Re-Evaluation 10/08/23    Authorization Type [employer]    PT Start Time 1106    PT Stop Time 1159    PT Time Calculation (min) 53 min    Activity Tolerance Patient tolerated treatment well    Behavior During Therapy WFL for tasks assessed/performed             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   Diabetes mellitus    fasting 90-100   HTN (hypertension) 03/25/2013   Hx of radiation therapy    breasts hx   Hypercholesterolemia    Hypertension    Lymphedema of arm    left arm   Pulmonary embolism (HCC)    Type II or unspecified type diabetes mellitus without mention of complication, not stated as uncontrolled 03/25/2013   Past Surgical History:  Procedure Laterality Date   ABDOMINAL HYSTERECTOMY     ANKLE SURGERY Right 1995   APPENDECTOMY     BIOPSY  01/07/2018   Procedure: BIOPSY;  Surgeon: Genell Ken, MD;  Location: [facility];  Service: Gastroenterology;;   BREAST SURGERY Bilateral    mastectomy   COLONOSCOPY WITH PROPOFOL  N/A 01/07/2018   Procedure: COLONOSCOPY WITH PROPOFOL ;  Surgeon: Genell Ken, MD;  Location: [facility];  Service: Gastroenterology;  Laterality: N/A;   ESOPHAGOGASTRODUODENOSCOPY (EGD) WITH PROPOFOL  N/A 01/06/2018   Procedure: ESOPHAGOGASTRODUODENOSCOPY (EGD) WITH PROPOFOL ;  Surgeon: Genell Ken, MD;  Location: [facility];  Service: Gastroenterology;  Laterality: N/A;   HERNIA REPAIR  04-26-2010   IR IMAGING GUIDED PORT INSERTION  10/29/2021   MASS EXCISION Left 01/26/2013   Procedure: EXCISION LEFT CHEST WALL MASS AND LEFT  ABDOMNAL WALL MASS;  Surgeon: Rogena Class, MD;  Location: [facility];  Service: General;  Laterality: Left;   MASS EXCISION Left 09/20/2014   Procedure: EXCISION OF LEFT CHEST WALL MASS;  Surgeon: Oza Blumenthal, MD;  Location: [facility];  Service: General;  Laterality: Left;   TEE WITHOUT CARDIOVERSION N/A 12/08/2017   Procedure: TRANSESOPHAGEAL ECHOCARDIOGRAM (TEE);  Surgeon: Lenise Quince, MD;  Location: [facility];  Service: Cardiovascular;  Laterality: N/A;   TOTAL HIP ARTHROPLASTY Right 01/05/2017   TOTAL HIP ARTHROPLASTY Right 01/05/2017   Procedure: TOTAL HIP ARTHROPLASTY ANTERIOR APPROACH;  Surgeon: Wendolyn Hamburger, MD;  Location: [facility];  Service: Orthopedics;  Laterality: Right;   Patient Active Problem List   Diagnosis Date Noted   Acute lower GI bleeding 01/07/2023   Rectal bleeding 01/05/2023   Acute GI bleeding 10/04/2022   Port-A-Cath in place 06/12/2022   Loss of perception for taste 03/17/2022   Primary osteoarthritis involving multiple joints 03/17/2022   Genetic testing 07/24/2021   Microcytosis 04/29/2021   Hypokalemia 04/28/2021   Pulmonary embolism (HCC)    Agnosia 08/09/2020   Hardening of the aorta (main artery of the heart) (HCC) 08/09/2020   Morbid obesity (HCC) 08/09/2020   Neuropathy 08/09/2020   Primary osteoarthritis 08/09/2020   Sensorineural hearing loss (SNHL) of both ears 08/07/2020   Anosmia  06/05/2020   Tinnitus, bilateral 06/05/2020   GI bleed 07/13/2019   ABLA (acute blood loss anemia) 07/12/2019   Microcytic anemia 01/05/2018   Melena 01/04/2018   Lower GI bleed 01/04/2018   Diabetes mellitus type II, controlled, with no complications (HCC) 12/05/2017   Tenosynovitis of left wrist 12/02/2017   Infection of left wrist (HCC) 11/30/2017   Primary osteoarthritis of right hip 01/05/2017   Osteoarthritis of right hip 01/03/2017   Lymphedema of upper extremity 01/17/2014   Osteopenia 01/17/2014   Central centrifugal scarring  alopecia 08/02/2013   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   Dilated pore of [place] 08/02/2013   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 08/02/2013   Scar 08/02/2013   Malignant neoplasm of lower-inner quadrant of left breast in female, estrogen receptor positive (HCC) 06/16/2013   Type II or unspecified type diabetes mellitus without mention of complication, not stated as uncontrolled 03/25/2013   Essential hypertension 03/25/2013   Chest wall recurrence of breast cancer (HCC) 02/21/2013   Abdominal wall mass 01/14/2013    PCP:   REFERRING PROVIDER: Alwin Baars, NP  REFERRING DIAG: Lymphedema s/p breast cancer  THERAPY DIAG:  Lymphedema, not elsewhere classified  Aftercare following surgery for neoplasm  ONSET DATE: 2014 with exacerbation  Rationale for Evaluation and Treatment: Rehabilitation  SUBJECTIVE:                                                                                                                                                                                           SUBJECTIVE STATEMENT:  I took Wednesdays bandages off Monday morning. I had to take the hand bandage off after a few days and I wore my compression glove.   EVAL The lymphedema in my arm seems to be worse and the NP wanted me to come see you. She doesn't wear her sleeve much because it doesn't really compress. The night garment is still too  big also. I have not been wrapped or in the sleeve for about 6 months. She also has a Flexi touch that she uses every once in a while.  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 Pts husband has wrapped before and she has a night garment  PAIN:  Are you having pain? No  PRECAUTIONS: right THR and diabetes, prior PE , significant OA bilateral shoulders, knees, left lumpectomy; no LN's RED FLAGS: None   WEIGHT BEARING RESTRICTIONS: No, but she uses a cane for knee OA  FALLS:  Has patient fallen in last 6 months? 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  COGNITION: Overall cognitive status: Within functional limits for tasks assessed   PALPATION: Significant fibrosis noted left forearm, mild pitting noted dorsum of hand and ulnar border of forearm  OBSERVATIONS / OTHER ASSESSMENTS: pt uses SPC, antalgic gait on left  SENSATION: Light touch:    POSTURE: forward head, rounded shoulders  UPPER EXTREMITY AROM/PROM:  A/PROM RIGHT   eval   Shoulder extension   Shoulder flexion 80(OA)  Shoulder abduction   Shoulder internal rotation   Shoulder external rotation     (Blank rows = not tested)  A/PROM LEFT   eval  Shoulder extension   Shoulder flexion 60 (OA)  Shoulder abduction   Shoulder internal rotation   Shoulder external rotation     (Blank rows = not tested)  CERVICAL  AROM: All within functional limits:     UPPER EXTREMITY STRENGTH:   LYMPHEDEMA ASSESSMENTS:   SURGERY TYPE/DATE:  1993: Right lumpectomy axillary dissection in [place]  (stage I) treated with radiation and tamoxifen  x 5 years 2002: Right mastectomy: Right breast recurrence 2004: Left mastectomy: 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 Developed lymphedema after this    2024 reoccurence LN's; had chemotherapy NUMBER OF LYMPH NODES REMOVED: ?  CHEMOTHERAPY: YES, last reoccurence  RADIATION:YES  HORMONE TREATMENT: YES,Neratinib /tamoxifen    INFECTIONS: NO   LYMPHEDEMA ASSESSMENTS:   LANDMARK RIGHT  eval  At axilla  30.3  15 cm proximal to olecranon process 28.3  10 cm proximal to olecranon process 26.7  Olecranon process 22.4  15 cm proximal to ulnar styloid process 20  10 cm proximal to ulnar styloid process 17.3  Just proximal to ulnar styloid process 15.1  Across hand at thumb web space 19.2  At base of 2nd digit 5.95  (Blank rows = not tested)  LANDMARK LEFT  eval Left 09/09/23  At axilla  36.0 33.9  15 cm proximal to olecranon process 35.1 32.5  10 cm proximal to olecranon process 35.0 31.3  Olecranon process 30.4 27.6  15 cm proximal to ulnar styloid process 29.8 27.8  10 cm proximal to ulnar styloid process 26.8 25.7  Just proximal to ulnar styloid process 23.4 20.4  Across hand at thumb web space 20.6 18.8  At base of 2nd digit 6.4 6  (Blank rows = not tested)   FUNCTIONAL TESTS:    GAIT: Distance walked: front desk to room 8 Assistive device utilized: Single point cane Level of assistance: Modified independence Comments: antalgic gait  LYMPHEDEMA LIFE IMPACT SCALE: 19  TREATMENT DATE:  09/15/23: Manual Therapy MLD to Lt UE as  follows: Short neck, superficial and deep abdominals, Lt inguinal nodes, Lt axillo-inguinal anastomosis, then focused on Lt UE working from proximal to distal then retracing all steps back to anastomosis. Compression Bandaging to Lt UE: Cocoa butter, TG soft, Molelast to fingers 1-4, Artiflex x 1 from hand to axilla, then short stretch compression bandages: 1-6 cm to hand/wrist, 1-12 cm spiral with "X" at elbow, and 1-12 cm spiral from wrist to axilla.  09/09/23: Circumference measurements taken MLD to Lt UE as follows: Short neck, superficial and deep abdominals, Lt inguinal nodes, Lt axillo-inguinal anastomosis, then focused on Lt UE working from proximal to distal then retracing all steps back to anastomosis. Compression Bandaging to Lt UE: Cocoa butter, TG soft, Molelast to fingers 1-4, Artiflex x 1 from hand to axilla, then short stretch compression bandages: 1-6 cm to hand/wrist, 1-12 cm herringbone with "X" at elbow, and 1-12 cm spiral from wrist to axilla.   09/04/23: Manual Therapy Removed bandages while assessing skin which looks great today with no areas of irritation. Then pt washed her arm with soap and water. MLD to Lt UE as follows: Short neck, superficial and deep abdominals, Lt inguinal nodes, Lt axillo-inguinal anastomosis, Rt axillary and pectoral nodes, anterior inter-axillary anastomosis, then focused on Lt UE working from proximal to distal then retracing all steps back to anastomosis. Compression Bandaging to Lt UE: Cocoa butter, juzo soft, Molelast to fingers 1-4, Artiflex x 1 from hand to axilla, then short stretch compression bandages: 1-6 cm to hand/wrist, 1-12 cm spiral with "X" at elbow, and 1-12 cm spiral from wrist to axilla.  Issued TG soft size med for pt to wear once she removes her bandages on Sunday due to long holiday weekend.        PATIENT EDUCATION:  Education details: LOS, POC, treatment interventions Person educated: Patient Education method:  Explanation Education comprehension: verbalized understanding  HOME EXERCISE PROGRAM:   ASSESSMENT:  CLINICAL IMPRESSION: Pt had left last bandage on x 5 days as she forgot to remove sooner. Reminded her not to leave bandage on that long because it can cause skin irritations and increased swelling as bandage shifts causing discrepancies in compression gradient. Continued with CDT of Lt UE. Plan for pt to be measured June 18.    OBJECTIVE IMPAIRMENTS: decreased activity tolerance, decreased knowledge of condition, decreased mobility, decreased ROM, decreased strength, increased edema, impaired UE functional use, and postural dysfunction.   ACTIVITY LIMITATIONS: carrying, lifting, standing, stairs, reach over head, and hygiene/grooming  PARTICIPATION LIMITATIONS: cleaning, shopping, and community activity  PERSONAL FACTORS: 3+ comorbidities: Left Breast Cancer with re;occurrence, radiation, chemotherapy, Lymphedema are also affecting patient's functional outcome.   REHAB POTENTIAL: Good  CLINICAL DECISION MAKING: Stable/uncomplicated  EVALUATION COMPLEXITY: Low  GOALS: Goals reviewed with patient? Yes  SHORT TERM GOALS: Target date: 09/17/2023  Pts husband will be independent with compression bandaging Baseline: Goal status: INITIAL  2.  Pt will have 2 cm reduction at 10 cm above ulna styloid process Baseline:  Goal status: INITIAL  3.  Pt will have 2 cm reduction prox to olecranon for improved fit of clothing Baseline:  Goal status: INITIAL  4.  Pt will be tolerant of compression bandaging Baseline:  Goal status: INITIAL   LONG TERM GOALS: Target date: 10/08/2023  Pt will have decreased edema at 10 cm prox to ulna styloid process by 3 cm or greater for better fit of clothing Baseline:  Goal  status: INITIAL  2.  Pt will have decreased edema at 10 cm prox to olecranon by 3 cm or greater for improved clothig fit Baseline:  Goal status: INITIAL  3.  Pt will have  appropriate day and night garments to control left UE lymphedema Baseline:  Goal status: INITIAL  4.  Pt/husband will be independent in management of lymphedema Baseline:  Goal status: INITIAL   PLAN:  PT FREQUENCY: 2x/week  PT DURATION: 6 weeks  PLANNED INTERVENTIONS: 97164- PT Re-evaluation, 97110-Therapeutic exercises, 97530- Therapeutic activity, 97535- Self Care, 21308- Manual therapy, [id number]- Orthotic Initial, [phone]- Orthotic/Prosthetic subsequent, Patient/Family education, and Manual lymph drainage  PLAN FOR NEXT SESSION: Cont CDT until June 18 wen pt will be measured. Pt has Flexi touch at home; encourage use before her husband wraps.  Denyce Flank, PTA 09/15/2023, 12:06 PM

## 2023-09-18 ENCOUNTER — Ambulatory Visit

## 2023-09-18 DIAGNOSIS — I890 Lymphedema, not elsewhere classified: Secondary | ICD-10-CM | POA: Diagnosis not present

## 2023-09-18 DIAGNOSIS — Z483 Aftercare following surgery for neoplasm: Secondary | ICD-10-CM

## 2023-09-18 NOTE — Therapy (Signed)
 OUTPATIENT PHYSICAL THERAPY  UPPER EXTREMITY ONCOLOGY TREATMENT  Patient Name: Tami Schmidt MRN: [id number] DOB:Dec 01, 1932, 89 y.o., female Today's Date: 09/18/2023  END OF SESSION:  PT End of Session - 09/18/23 0911     Visit Number 6    Number of Visits 12    Date for PT Re-Evaluation 10/08/23    Authorization Time Period 08/27/2023-10/08/2023    Authorization - Number of Visits 12    PT Start Time 0803    PT Stop Time 0905    PT Time Calculation (min) 62 min    Activity Tolerance Patient tolerated treatment well    Behavior During Therapy [facility] for tasks assessed/performed             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   Diabetes mellitus    fasting 90-100   HTN (hypertension) 03/25/2013   Hx of radiation therapy    breasts hx   Hypercholesterolemia    Hypertension    Lymphedema of arm    left arm   Pulmonary embolism (HCC)    Type II or unspecified type diabetes mellitus without mention of complication, not stated as uncontrolled 03/25/2013   Past Surgical History:  Procedure Laterality Date   ABDOMINAL HYSTERECTOMY     ANKLE SURGERY Right 1995   APPENDECTOMY     BIOPSY  01/07/2018   Procedure: BIOPSY;  Surgeon: Genell Ken, MD;  Location: [facility];  Service: Gastroenterology;;   BREAST SURGERY Bilateral    mastectomy   COLONOSCOPY WITH PROPOFOL  N/A 01/07/2018   Procedure: COLONOSCOPY WITH PROPOFOL ;  Surgeon: Genell Ken, MD;  Location: [facility];  Service: Gastroenterology;  Laterality: N/A;   ESOPHAGOGASTRODUODENOSCOPY (EGD) WITH PROPOFOL  N/A 01/06/2018   Procedure: ESOPHAGOGASTRODUODENOSCOPY (EGD) WITH PROPOFOL ;  Surgeon: Genell Ken, MD;  Location: [facility];  Service: Gastroenterology;  Laterality: N/A;   HERNIA REPAIR  04-26-2010   IR IMAGING GUIDED PORT INSERTION  10/29/2021   MASS EXCISION Left  01/26/2013   Procedure: EXCISION LEFT CHEST WALL MASS AND LEFT ABDOMNAL WALL MASS;  Surgeon: Rogena Class, MD;  Location: [facility];  Service: General;  Laterality: Left;   MASS EXCISION Left 09/20/2014   Procedure: EXCISION OF LEFT CHEST WALL MASS;  Surgeon: Oza Blumenthal, MD;  Location: [facility];  Service: General;  Laterality: Left;   TEE WITHOUT CARDIOVERSION N/A 12/08/2017   Procedure: TRANSESOPHAGEAL ECHOCARDIOGRAM (TEE);  Surgeon: Lenise Quince, MD;  Location: [facility];  Service: Cardiovascular;  Laterality: N/A;   TOTAL HIP ARTHROPLASTY Right 01/05/2017   TOTAL HIP ARTHROPLASTY Right 01/05/2017   Procedure: TOTAL HIP ARTHROPLASTY ANTERIOR APPROACH;  Surgeon: Wendolyn Hamburger, MD;  Location: [facility];  Service: Orthopedics;  Laterality: Right;   Patient Active Problem List   Diagnosis Date Noted   Acute lower GI bleeding 01/07/2023   Rectal bleeding 01/05/2023   Acute GI bleeding 10/04/2022   Port-A-Cath in place 06/12/2022   Loss of perception for taste 03/17/2022   Primary osteoarthritis involving multiple joints 03/17/2022   Genetic testing 07/24/2021   Microcytosis 04/29/2021   Hypokalemia 04/28/2021   Pulmonary embolism (HCC)    Agnosia 08/09/2020   Hardening of the aorta (main artery of the heart) (HCC) 08/09/2020   Morbid obesity (HCC) 08/09/2020   Neuropathy 08/09/2020   Primary osteoarthritis 08/09/2020   Sensorineural  hearing loss (SNHL) of both ears 08/07/2020   Anosmia 06/05/2020   Tinnitus, bilateral 06/05/2020   GI bleed 07/13/2019   ABLA (acute blood loss anemia) 07/12/2019   Microcytic anemia 01/05/2018   Melena 01/04/2018   Lower GI bleed 01/04/2018   Diabetes mellitus type II, controlled, with no complications (HCC) 12/05/2017   Tenosynovitis of left wrist 12/02/2017   Infection of left wrist (HCC) 11/30/2017   Primary osteoarthritis of right hip 01/05/2017   Osteoarthritis of right hip 01/03/2017   Lymphedema of upper extremity  01/17/2014   Osteopenia 01/17/2014   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   Dilated pore of [place]iner 08/02/2013   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 08/02/2013   Scar 08/02/2013   Malignant neoplasm of lower-inner quadrant of left breast in female, estrogen receptor positive (HCC) 06/16/2013   Type II or unspecified type diabetes mellitus without mention of complication, not stated as uncontrolled 03/25/2013   Essential hypertension 03/25/2013   Chest wall recurrence of breast cancer (HCC) 02/21/2013   Abdominal wall mass 01/14/2013    PCP:   REFERRING PROVIDER: Alwin Baars, NP  REFERRING DIAG: Lymphedema s/p breast cancer  THERAPY DIAG:  Lymphedema, not elsewhere classified  Aftercare following surgery for neoplasm  ONSET DATE: 2014 with exacerbation  Rationale for Evaluation and Treatment: Rehabilitation  SUBJECTIVE:                                                                                                                                                                                           SUBJECTIVE STATEMENT:  My wrap was put on 3-4 days ago. I usually take it off, but I didn't get around to it. The thumb wrap fell off, but everything else did well.  EVAL The lymphedema in my arm seems to be worse and the NP wanted me to come see you. She doesn't wear her sleeve much because it doesn't really compress. The night garment is still too  big also. I have not been wrapped or in the sleeve for about 6 months. She also has a Flexi touch that she uses every once in a while.  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 Pts husband has wrapped before and she has a night garment  PAIN:  Are you having pain? No  PRECAUTIONS: right THR and diabetes, prior PE , significant OA bilateral shoulders, knees, left lumpectomy; no LN's RED FLAGS: None   WEIGHT BEARING RESTRICTIONS: No, but she uses a cane for knee OA  FALLS:  Has patient fallen in last 6 months? 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  COGNITION: Overall cognitive status: Within functional limits for tasks assessed   PALPATION: Significant fibrosis noted left forearm, mild pitting noted dorsum of hand and ulnar border of forearm  OBSERVATIONS / OTHER ASSESSMENTS: pt uses SPC, antalgic gait on left  SENSATION: Light touch:    POSTURE: forward head, rounded shoulders  UPPER EXTREMITY AROM/PROM:  A/PROM RIGHT   eval   Shoulder extension   Shoulder flexion 80(OA)  Shoulder abduction   Shoulder internal rotation   Shoulder external rotation     (Blank rows = not tested)  A/PROM LEFT   eval  Shoulder extension   Shoulder flexion 60 (OA)  Shoulder abduction   Shoulder internal rotation    Shoulder external rotation     (Blank rows = not tested)  CERVICAL AROM: All within functional limits:     UPPER EXTREMITY STRENGTH:   LYMPHEDEMA ASSESSMENTS:   SURGERY TYPE/DATE:  1993: Right lumpectomy axillary dissection in [place]  (stage I) treated with radiation and tamoxifen  x 5 years 2002: Right mastectomy: Right breast recurrence 2004: Left mastectomy: 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 Developed lymphedema after this    2024 reoccurence LN's; had chemotherapy NUMBER OF LYMPH NODES REMOVED: ?  CHEMOTHERAPY: YES, last reoccurence  RADIATION:YES  HORMONE TREATMENT: YES,Neratinib /tamoxifen    INFECTIONS: NO   LYMPHEDEMA ASSESSMENTS:   LANDMARK RIGHT  eval  At axilla  30.3  15 cm proximal to olecranon process 28.3  10 cm proximal to olecranon process 26.7  Olecranon process 22.4  15 cm proximal to ulnar styloid process 20  10 cm proximal to ulnar styloid process 17.3  Just proximal to ulnar styloid process 15.1  Across hand at thumb web space 19.2  At base of 2nd digit 5.95  (Blank rows = not tested)  LANDMARK LEFT  eval Left 09/09/23 LEFT 09/18/2023  At axilla  36.0 33.9 32.2  15 cm proximal to olecranon process 35.1 32.5 32.7  10 cm proximal to olecranon process 35.0 31.3 32.2  Olecranon process 30.4 27.6 26.7  15 cm proximal to ulnar styloid process 29.8 27.8 25.7  10 cm proximal to ulnar styloid process 26.8 25.7 23.2  Just proximal to ulnar styloid process 23.4 20.4 19.7  Across hand at thumb web space 20.6 18.8 18.6  At base of 2nd digit 6.4 6 5.9  (Blank rows = not tested)   FUNCTIONAL TESTS:    GAIT: Distance walked: front desk to  room 8 Assistive device utilized: Single point cane Level of assistance: Modified independence Comments: antalgic gait  LYMPHEDEMA LIFE IMPACT SCALE: 19                                                                                                                             TREATMENT DATE:  09/18/2023  Manual Therapy Wraps removed and pt remeasured with excellent results MLD to Lt UE as follows: Short neck, superficial and deep abdominals, Lt inguinal nodes, Lt axillo-inguinal anastomosis, then focused on Lt UE working from proximal to distal then retracing all steps back to anastomosis. Compression Bandaging to Lt UE: Cocoa butter, TG soft, Molelast to fingers 1-4, Artiflex x 1 from hand to axilla, then short stretch compression bandages: 1-6 cm to hand/wrist, 1-12 cm spiral with "X" at elbow, and 1-12 cm spiral from wrist to axilla. Pt performed remedial exs. Answered pt questions about lymphedema while performing MLD. Why do you get this, why is MLD so light? Discussed laundering bandages on Sun/Monday night so the stretch is renewed before her next visit. Reminded again to remove wraps if they are every too tight causingpain, numbness etc  09/15/23: Manual Therapy MLD to Lt UE as follows: Short neck, superficial and deep abdominals, Lt inguinal nodes, Lt axillo-inguinal anastomosis, then focused on Lt UE working from proximal to distal then retracing all steps back to anastomosis. Compression Bandaging to Lt UE: Cocoa butter, TG soft, Molelast to fingers 1-4, Artiflex x 1 from hand to axilla, then short stretch compression bandages: 1-6 cm to hand/wrist, 1-12 cm spiral with "X" at elbow, and 1-12 cm spiral from wrist to axilla.  09/09/23: Circumference measurements taken MLD to Lt UE as follows: Short neck, superficial and deep abdominals, Lt inguinal nodes, Lt axillo-inguinal anastomosis, then focused on Lt UE working from proximal to distal then retracing all steps back to anastomosis. Compression Bandaging to Lt UE: Cocoa butter, TG soft, Molelast to fingers 1-4, Artiflex x 1 from hand to axilla, then short stretch compression bandages: 1-6 cm to hand/wrist, 1-12 cm herringbone with "X" at elbow,  and 1-12 cm spiral from wrist to axilla.   09/04/23: Manual Therapy Removed bandages while assessing skin which looks great today with no areas of irritation. Then pt washed her arm with soap and water. MLD to Lt UE as follows: Short neck, superficial and deep abdominals, Lt inguinal nodes, Lt axillo-inguinal anastomosis, Rt axillary and pectoral nodes, anterior inter-axillary anastomosis, then focused on Lt UE working from proximal to distal then retracing all steps back to anastomosis. Compression Bandaging to Lt UE: Cocoa butter, juzo soft, Molelast to fingers 1-4, Artiflex x 1 from hand to axilla, then short stretch compression bandages: 1-6 cm to hand/wrist, 1-12 cm spiral with "X" at elbow, and 1-12 cm spiral from wrist to axilla.  Issued TG soft size med for pt to wear once she removes her bandages on Sunday due to long holiday weekend.  PATIENT EDUCATION:  Education details: LOS, POC, treatment interventions Person educated: Patient Education method: Explanation Education comprehension: verbalized understanding  HOME EXERCISE PROGRAM:   ASSESSMENT:  CLINICAL IMPRESSION: Pts wraps remained intact since last visit except for the thumb. Measurements continue to reduce nicely. Advised to launder before next visit as they are getting stretched out.. Pt achieved all STG's except pts hyusband being compliant. He has not yet tried wrapping her.  OBJECTIVE IMPAIRMENTS: decreased activity tolerance, decreased knowledge of condition, decreased mobility, decreased ROM, decreased strength, increased edema, impaired UE functional use, and postural dysfunction.   ACTIVITY LIMITATIONS: carrying, lifting, standing, stairs, reach over head, and hygiene/grooming  PARTICIPATION LIMITATIONS: cleaning, shopping, and community activity  PERSONAL FACTORS: 3+ comorbidities: Left Breast Cancer with re;occurrence, radiation, chemotherapy, Lymphedema are also affecting patient's functional  outcome.   REHAB POTENTIAL: Good  CLINICAL DECISION MAKING: Stable/uncomplicated  EVALUATION COMPLEXITY: Low  GOALS: Goals reviewed with patient? Yes  SHORT TERM GOALS: Target date: 09/17/2023  Pts husband will be independent with compression bandaging Baseline: Goal status:In Progress  2.  Pt will have 2 cm reduction at 10 cm above ulna styloid process Baseline:  Goal status: MET 09/18/2023  3.  Pt will have 2 cm reduction at 10 cm prox to olecranon for improved fit of clothing Baseline:  Goal status: MET 09/18/2023 4.  Pt will be tolerant of compression bandaging Baseline:  Goal status: MET 09/18/2023  LONG TERM GOALS: Target date: 10/08/2023  Pt will have decreased edema at 10 cm prox to ulna styloid process by 3 cm or greater for better fit of clothing Baseline:  Goal status:MET 09/18/2023  2.  Pt will have decreased edema at 10 cm prox to olecranon by 3 cm or greater for improved clothig fit Baseline:  Goal status: INITIAL  3.  Pt will have appropriate day and night garments to control left UE lymphedema Baseline:  Goal status: INITIAL  4.  Pt/husband will be independent in management of lymphedema Baseline:  Goal status: INITIAL   PLAN:  PT FREQUENCY: 2x/week  PT DURATION: 6 weeks  PLANNED INTERVENTIONS: 97164- PT Re-evaluation, 97110-Therapeutic exercises, 97530- Therapeutic activity, 97535- Self Care, 16109- Manual therapy, [id number]- Orthotic Initial, [phone]- Orthotic/Prosthetic subsequent, Patient/Family education, and Manual lymph drainage  PLAN FOR NEXT SESSION: Cont CDT until June 18 wen pt will be measured. Pt has Flexi touch at home; encourage use before her husband wraps.  Latisha Poland, PT 09/18/2023, 9:17 AM

## 2023-09-22 ENCOUNTER — Ambulatory Visit

## 2023-09-22 DIAGNOSIS — Z483 Aftercare following surgery for neoplasm: Secondary | ICD-10-CM

## 2023-09-22 DIAGNOSIS — I890 Lymphedema, not elsewhere classified: Secondary | ICD-10-CM | POA: Diagnosis not present

## 2023-09-22 NOTE — Therapy (Signed)
 OUTPATIENT PHYSICAL THERAPY  UPPER EXTREMITY ONCOLOGY TREATMENT  Patient Name: Tami Schmidt MRN: [id number] DOB:1932/06/23, 89 y.o., female Today's Date: 09/22/2023  END OF SESSION:  PT End of Session - 09/22/23 0909     Visit Number 7    Number of Visits 12    Date for PT Re-Evaluation 10/08/23    Authorization Type [employer]    Authorization - Visit Number 7    Authorization - Number of Visits 12    PT Start Time 0901    PT Stop Time 0958    PT Time Calculation (min) 57 min    Activity Tolerance Patient tolerated treatment well    Behavior During Therapy WFL for tasks assessed/performed             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   Diabetes mellitus    fasting 90-100   HTN (hypertension) 03/25/2013   Hx of radiation therapy    breasts hx   Hypercholesterolemia    Hypertension    Lymphedema of arm    left arm   Pulmonary embolism (HCC)    Type II or unspecified type diabetes mellitus without mention of complication, not stated as uncontrolled 03/25/2013   Past Surgical History:  Procedure Laterality Date   ABDOMINAL HYSTERECTOMY     ANKLE SURGERY Right 1995   APPENDECTOMY     BIOPSY  01/07/2018   Procedure: BIOPSY;  Surgeon: Genell Ken, MD;  Location: [facility];  Service: Gastroenterology;;   BREAST SURGERY Bilateral    mastectomy   COLONOSCOPY WITH PROPOFOL  N/A 01/07/2018   Procedure: COLONOSCOPY WITH PROPOFOL ;  Surgeon: Genell Ken, MD;  Location: [facility];  Service: Gastroenterology;  Laterality: N/A;   ESOPHAGOGASTRODUODENOSCOPY (EGD) WITH PROPOFOL  N/A 01/06/2018   Procedure: ESOPHAGOGASTRODUODENOSCOPY (EGD) WITH PROPOFOL ;  Surgeon: Genell Ken, MD;  Location: [facility];  Service: Gastroenterology;  Laterality: N/A;   HERNIA REPAIR  04-26-2010   IR IMAGING GUIDED PORT INSERTION  10/29/2021   MASS EXCISION  Left 01/26/2013   Procedure: EXCISION LEFT CHEST WALL MASS AND LEFT ABDOMNAL WALL MASS;  Surgeon: Rogena Class, MD;  Location: [facility];  Service: General;  Laterality: Left;   MASS EXCISION Left 09/20/2014   Procedure: EXCISION OF LEFT CHEST WALL MASS;  Surgeon: Oza Blumenthal, MD;  Location: [facility];  Service: General;  Laterality: Left;   TEE WITHOUT CARDIOVERSION N/A 12/08/2017   Procedure: TRANSESOPHAGEAL ECHOCARDIOGRAM (TEE);  Surgeon: Lenise Quince, MD;  Location: [facility];  Service: Cardiovascular;  Laterality: N/A;   TOTAL HIP ARTHROPLASTY Right 01/05/2017   TOTAL HIP ARTHROPLASTY Right 01/05/2017   Procedure: TOTAL HIP ARTHROPLASTY ANTERIOR APPROACH;  Surgeon: Wendolyn Hamburger, MD;  Location: [facility];  Service: Orthopedics;  Laterality: Right;   Patient Active Problem List   Diagnosis Date Noted   Acute lower GI bleeding 01/07/2023   Rectal bleeding 01/05/2023   Acute GI bleeding 10/04/2022   Port-A-Cath in place 06/12/2022   Loss of perception for taste 03/17/2022   Primary osteoarthritis involving multiple joints 03/17/2022   Genetic testing 07/24/2021   Microcytosis 04/29/2021   Hypokalemia 04/28/2021   Pulmonary embolism (HCC)    Agnosia 08/09/2020   Hardening of the aorta (main artery of the heart) (HCC) 08/09/2020   Morbid obesity (HCC) 08/09/2020   Neuropathy 08/09/2020  Primary osteoarthritis 08/09/2020   Sensorineural hearing loss (SNHL) of both ears 08/07/2020   Anosmia 06/05/2020   Tinnitus, bilateral 06/05/2020   GI bleed 07/13/2019   ABLA (acute blood loss anemia) 07/12/2019   Microcytic anemia 01/05/2018   Melena 01/04/2018   Lower GI bleed 01/04/2018   Diabetes mellitus type II, controlled, with no complications (HCC) 12/05/2017   Tenosynovitis of left wrist 12/02/2017   Infection of left wrist (HCC) 11/30/2017   Primary osteoarthritis of right hip 01/05/2017   Osteoarthritis of right hip 01/03/2017   Lymphedema of upper extremity  01/17/2014   Osteopenia 01/17/2014   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   Dilated pore of [place]iner 08/02/2013   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 08/02/2013   Scar 08/02/2013   Malignant neoplasm of lower-inner quadrant of left breast in female, estrogen receptor positive (HCC) 06/16/2013   Type II or unspecified type diabetes mellitus without mention of complication, not stated as uncontrolled 03/25/2013   Essential hypertension 03/25/2013   Chest wall recurrence of breast cancer (HCC) 02/21/2013   Abdominal wall mass 01/14/2013    PCP:   REFERRING PROVIDER: Alwin Baars, NP  REFERRING DIAG: Lymphedema s/p breast cancer  THERAPY DIAG:  Lymphedema, not elsewhere classified  Aftercare following surgery for neoplasm  ONSET DATE: 2014 with exacerbation  Rationale for Evaluation and Treatment: Rehabilitation  SUBJECTIVE:                                                                                                                                                                                           SUBJECTIVE STATEMENT:  I'm tolerating the bandaging fine.   EVAL The lymphedema in my arm seems to be worse and the NP wanted me to come see you. She doesn't wear her sleeve much because it doesn't really compress. The night garment is still too  big also. I have not been wrapped or in the sleeve for about 6 months. She also has a Flexi touch that she uses every once in a while.  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 Pts husband has wrapped before and she has a night garment  PAIN:  Are you having pain? No  PRECAUTIONS: right THR and diabetes, prior PE , significant OA bilateral shoulders, knees, left lumpectomy; no LN's RED FLAGS: None   WEIGHT BEARING RESTRICTIONS: No, but she uses a cane for knee OA  FALLS:  Has patient fallen in last 6 months? 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  COGNITION: Overall cognitive status: Within functional limits for tasks assessed   PALPATION: Significant fibrosis noted left forearm, mild pitting noted dorsum of hand and ulnar border of forearm  OBSERVATIONS / OTHER ASSESSMENTS: pt uses SPC, antalgic gait on left  SENSATION: Light touch:    POSTURE: forward head, rounded shoulders  UPPER EXTREMITY AROM/PROM:  A/PROM RIGHT   eval   Shoulder extension   Shoulder flexion 80(OA)  Shoulder abduction   Shoulder internal rotation   Shoulder external rotation     (Blank rows = not tested)  A/PROM LEFT   eval  Shoulder extension   Shoulder flexion 60 (OA)  Shoulder abduction   Shoulder internal rotation   Shoulder external rotation     (Blank rows = not tested)  CERVICAL AROM: All within functional  limits:     UPPER EXTREMITY STRENGTH:   LYMPHEDEMA ASSESSMENTS:   SURGERY TYPE/DATE:  1993: Right lumpectomy axillary dissection in [place]  (stage I) treated with radiation and tamoxifen  x 5 years 2002: Right mastectomy: Right breast recurrence 2004: Left mastectomy: 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 Developed lymphedema after this    2024 reoccurence LN's; had chemotherapy NUMBER OF LYMPH NODES REMOVED: ?  CHEMOTHERAPY: YES, last reoccurence  RADIATION:YES  HORMONE TREATMENT: YES,Neratinib /tamoxifen    INFECTIONS: NO   LYMPHEDEMA ASSESSMENTS:   LANDMARK RIGHT  eval  At axilla  30.3  15 cm proximal to olecranon process 28.3  10 cm proximal to olecranon process 26.7  Olecranon process 22.4  15 cm proximal to ulnar styloid process 20  10 cm proximal to ulnar styloid process 17.3  Just proximal to ulnar styloid process 15.1  Across hand at thumb web space 19.2  At base of 2nd digit 5.95  (Blank rows = not tested)  LANDMARK LEFT  eval Left 09/09/23 LEFT 09/18/2023  At axilla  36.0 33.9 32.2  15 cm proximal to olecranon process 35.1 32.5 32.7  10 cm proximal to olecranon process 35.0 31.3 32.2  Olecranon process 30.4 27.6 26.7  15 cm proximal to ulnar styloid process 29.8 27.8 25.7  10 cm proximal to ulnar styloid process 26.8 25.7 23.2  Just proximal to ulnar styloid process 23.4 20.4 19.7  Across hand at thumb web space 20.6 18.8 18.6  At base of 2nd digit 6.4 6 5.9  (Blank rows = not tested)   FUNCTIONAL TESTS:    GAIT: Distance walked: front desk to room 8 Assistive device utilized: Single point cane Level of assistance: Modified independence Comments: antalgic gait  LYMPHEDEMA  LIFE IMPACT SCALE: 19                                                                                                                            TREATMENT DATE:  09/22/23: MLD  to Lt UE as follows: Short neck, superficial and deep abdominals, Lt inguinal nodes, Lt axillo-inguinal anastomosis, then focused on Lt UE working from proximal to distal then retracing all steps back to anastomosis. Compression Bandaging to Lt UE: Cocoa butter, TG soft, Molelast to fingers 1-4, Artiflex x 1 from hand to axilla, then short stretch compression bandages: 1-6 cm to hand/wrist, 1-12 cm spiral with "X" at elbow, and 1-12 cm spiral from wrist to axilla.   09/18/2023 Manual Therapy Wraps removed and pt remeasured with excellent results MLD to Lt UE as follows: Short neck, superficial and deep abdominals, Lt inguinal nodes, Lt axillo-inguinal anastomosis, then focused on Lt UE working from proximal to distal then retracing all steps back to anastomosis. Compression Bandaging to Lt UE: Cocoa butter, TG soft, Molelast to fingers 1-4, Artiflex x 1 from hand to axilla, then short stretch compression bandages: 1-6 cm to hand/wrist, 1-12 cm spiral with "X" at elbow, and 1-12 cm spiral from wrist to axilla. Pt performed remedial exs. Answered pt questions about lymphedema while performing MLD. Why do you get this, why is MLD so light? Discussed laundering bandages on Sun/Monday night so the stretch is renewed before her next visit. Reminded again to remove wraps if they are every too tight causingpain, numbness etc  09/15/23: Manual Therapy MLD to Lt UE as follows: Short neck, superficial and deep abdominals, Lt inguinal nodes, Lt axillo-inguinal anastomosis, then focused on Lt UE working from proximal to distal then retracing all steps back to anastomosis. Compression Bandaging to Lt UE: Cocoa butter, TG soft, Molelast to fingers 1-4, Artiflex x 1 from hand to axilla, then short stretch compression bandages: 1-6 cm to hand/wrist, 1-12 cm spiral with "X" at elbow, and 1-12 cm spiral from wrist to axilla.      PATIENT EDUCATION:  Education details: LOS, POC, treatment interventions Person  educated: Patient Education method: Explanation Education comprehension: verbalized understanding  HOME EXERCISE PROGRAM:   ASSESSMENT:  CLINICAL IMPRESSION: Pt is tolerating bandages well with no skin irritations. She will be measured for flat knit custom compression garments Wed June 18 at 1:30. Continued with CDT for Lt UE. Pt is using her Flexitouch when her bandages are off.   OBJECTIVE IMPAIRMENTS: decreased activity tolerance, decreased knowledge of condition, decreased mobility, decreased ROM, decreased strength, increased edema, impaired UE functional use, and postural dysfunction.   ACTIVITY LIMITATIONS: carrying, lifting, standing, stairs, reach over head, and hygiene/grooming  PARTICIPATION LIMITATIONS: cleaning, shopping, and community activity  PERSONAL FACTORS: 3+ comorbidities: Left Breast Cancer with re;occurrence, radiation, chemotherapy, Lymphedema are also affecting patient's functional outcome.   REHAB POTENTIAL: Good  CLINICAL DECISION MAKING: Stable/uncomplicated  EVALUATION COMPLEXITY: Low  GOALS: Goals reviewed with patient? Yes  SHORT TERM GOALS: Target date: 09/17/2023  Pts husband will be independent with compression bandaging Baseline: Goal status:In Progress  2.  Pt will have 2 cm reduction at 10 cm above ulna styloid process Baseline:  Goal status: MET 09/18/2023  3.  Pt will have 2 cm reduction at 10 cm prox to olecranon for improved fit of clothing Baseline:  Goal status: MET 09/18/2023 4.  Pt will be tolerant of compression bandaging Baseline:  Goal status: MET 09/18/2023  LONG TERM GOALS: Target date: 10/08/2023  Pt will have decreased edema at 10 cm prox to ulna styloid process by 3 cm or greater for better fit of clothing Baseline:  Goal status:MET 09/18/2023  2.  Pt will have decreased edema at 10 cm prox to olecranon by 3 cm or greater for improved clothig fit Baseline:  Goal status: INITIAL  3.  Pt will have appropriate day and  night garments to control left UE lymphedema Baseline:  Goal status: INITIAL  4.  Pt/husband will be independent in management of lymphedema Baseline:  Goal status: INITIAL   PLAN:  PT FREQUENCY: 2x/week  PT DURATION: 6 weeks  PLANNED INTERVENTIONS: 97164- PT Re-evaluation, 97110-Therapeutic exercises, 97530- Therapeutic activity, 97535- Self Care, 42595- Manual therapy, [id number]- Orthotic Initial, [id number]- Orthotic/Prosthetic subsequent, Patient/Family education, and Manual lymph drainage  PLAN FOR NEXT SESSION: Cont CDT until June 18 when pt will be measured and 1:30.   Denyce Flank, PTA 09/22/2023, 10:38 AM

## 2023-09-25 ENCOUNTER — Ambulatory Visit

## 2023-09-25 DIAGNOSIS — I890 Lymphedema, not elsewhere classified: Secondary | ICD-10-CM

## 2023-09-25 DIAGNOSIS — Z483 Aftercare following surgery for neoplasm: Secondary | ICD-10-CM

## 2023-09-25 NOTE — Therapy (Signed)
 OUTPATIENT PHYSICAL THERAPY  UPPER EXTREMITY ONCOLOGY TREATMENT  Patient Name: Tami Schmidt MRN: [id number] DOB:1933/01/29, 89 y.o., female Today's Date: 09/25/2023  END OF SESSION:  PT End of Session - 09/25/23 0903     Visit Number 8    Number of Visits 12    Date for PT Re-Evaluation 10/08/23    Authorization Type [employer]    Authorization Time Period 08/27/2023-10/08/2023    Authorization - Visit Number 8    Authorization - Number of Visits 12    PT Start Time 0801    PT Stop Time 0857    PT Time Calculation (min) 56 min    Activity Tolerance Patient tolerated treatment well    Behavior During Therapy WFL for tasks assessed/performed          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   Diabetes mellitus    fasting 90-100   HTN (hypertension) 03/25/2013   Hx of radiation therapy    breasts hx   Hypercholesterolemia    Hypertension    Lymphedema of arm    left arm   Pulmonary embolism (HCC)    Type II or unspecified type diabetes mellitus without mention of complication, not stated as uncontrolled 03/25/2013   Past Surgical History:  Procedure Laterality Date   ABDOMINAL HYSTERECTOMY     ANKLE SURGERY Right 1995   APPENDECTOMY     BIOPSY  01/07/2018   Procedure: BIOPSY;  Surgeon: Genell Ken, MD;  Location: [facility];  Service: Gastroenterology;;   BREAST SURGERY Bilateral    mastectomy   COLONOSCOPY WITH PROPOFOL  N/A 01/07/2018   Procedure: COLONOSCOPY WITH PROPOFOL ;  Surgeon: Genell Ken, MD;  Location: [facility];  Service: Gastroenterology;  Laterality: N/A;   ESOPHAGOGASTRODUODENOSCOPY (EGD) WITH PROPOFOL  N/A 01/06/2018   Procedure: ESOPHAGOGASTRODUODENOSCOPY (EGD) WITH PROPOFOL ;  Surgeon: Genell Ken, MD;  Location: [facility];  Service: Gastroenterology;  Laterality: N/A;   HERNIA REPAIR  04-26-2010   IR IMAGING  GUIDED PORT INSERTION  10/29/2021   MASS EXCISION Left 01/26/2013   Procedure: EXCISION LEFT CHEST WALL MASS AND LEFT ABDOMNAL WALL MASS;  Surgeon: Rogena Class, MD;  Location: [facility];  Service: General;  Laterality: Left;   MASS EXCISION Left 09/20/2014   Procedure: EXCISION OF LEFT CHEST WALL MASS;  Surgeon: Oza Blumenthal, MD;  Location: [facility];  Service: General;  Laterality: Left;   TEE WITHOUT CARDIOVERSION N/A 12/08/2017   Procedure: TRANSESOPHAGEAL ECHOCARDIOGRAM (TEE);  Surgeon: Lenise Quince, MD;  Location: [facility];  Service: Cardiovascular;  Laterality: N/A;   TOTAL HIP ARTHROPLASTY Right 01/05/2017   TOTAL HIP ARTHROPLASTY Right 01/05/2017   Procedure: TOTAL HIP ARTHROPLASTY ANTERIOR APPROACH;  Surgeon: Wendolyn Hamburger, MD;  Location: [facility];  Service: Orthopedics;  Laterality: Right;   Patient Active Problem List   Diagnosis Date Noted   Acute lower GI bleeding 01/07/2023   Rectal bleeding 01/05/2023   Acute GI bleeding 10/04/2022   Port-A-Cath in place 06/12/2022   Loss of perception for taste 03/17/2022   Primary osteoarthritis involving multiple joints 03/17/2022   Genetic testing 07/24/2021   Microcytosis 04/29/2021   Hypokalemia 04/28/2021   Pulmonary embolism (HCC)    Agnosia 08/09/2020   Hardening of the aorta (main artery of the heart) (HCC) 08/09/2020   Morbid obesity (HCC) 08/09/2020  Neuropathy 08/09/2020   Primary osteoarthritis 08/09/2020   Sensorineural hearing loss (SNHL) of both ears 08/07/2020   Anosmia 06/05/2020   Tinnitus, bilateral 06/05/2020   GI bleed 07/13/2019   ABLA (acute blood loss anemia) 07/12/2019   Microcytic anemia 01/05/2018   Melena 01/04/2018   Lower GI bleed 01/04/2018   Diabetes mellitus type II, controlled, with no complications (HCC) 12/05/2017   Tenosynovitis of left wrist 12/02/2017   Infection of left wrist (HCC) 11/30/2017   Primary osteoarthritis of right hip 01/05/2017   Osteoarthritis of  right hip 01/03/2017   Lymphedema of upper extremity 01/17/2014   Osteopenia 01/17/2014   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   Dilated pore of [place] 08/02/2013   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 08/02/2013   Scar 08/02/2013   Malignant neoplasm of lower-inner quadrant of left breast in female, estrogen receptor positive (HCC) 06/16/2013   Type II or unspecified type diabetes mellitus without mention of complication, not stated as uncontrolled 03/25/2013   Essential hypertension 03/25/2013   Chest wall recurrence of breast cancer (HCC) 02/21/2013   Abdominal wall mass 01/14/2013    PCP:   REFERRING PROVIDER: Alwin Baars, NP  REFERRING DIAG: Lymphedema s/p breast cancer  THERAPY DIAG:  Lymphedema, not elsewhere classified  Aftercare following surgery for neoplasm  ONSET DATE: 2014 with exacerbation  Rationale for Evaluation and Treatment: Rehabilitation  SUBJECTIVE:                                                                                                                                                                                           SUBJECTIVE STATEMENT:  I can't wait to be measured next week. Later I'd like to do some physical therapy for my legs. I can't stand up from a chair like I used to.   EVAL The lymphedema in my arm seems to be worse and the NP wanted me to come see you. She doesn't wear her sleeve much because it doesn't really compress. The night garment is still too  big also. I have not been wrapped or in the sleeve for about 6 months. She also has a Flexi touch that she uses every once in a while.  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 Pts husband has wrapped before and she has a night garment  PAIN:  Are you having pain? No  PRECAUTIONS: right THR and diabetes, prior PE , significant OA bilateral shoulders, knees, left lumpectomy; no LN's RED FLAGS: None   WEIGHT BEARING RESTRICTIONS: No, but she uses a cane for knee OA  FALLS:  Has patient fallen in last 6 months? 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  COGNITION: Overall cognitive status: Within functional limits for tasks assessed   PALPATION: Significant fibrosis noted left forearm, mild pitting noted dorsum of hand and ulnar border of forearm  OBSERVATIONS / OTHER ASSESSMENTS: pt uses SPC, antalgic gait on left  SENSATION: Light touch:    POSTURE: forward head, rounded shoulders  UPPER EXTREMITY AROM/PROM:  A/PROM RIGHT   eval   Shoulder extension   Shoulder flexion 80(OA)  Shoulder abduction   Shoulder internal rotation   Shoulder external rotation     (Blank rows = not tested)  A/PROM LEFT   eval  Shoulder extension   Shoulder flexion 60  (OA)  Shoulder abduction   Shoulder internal rotation   Shoulder external rotation     (Blank rows = not tested)  CERVICAL AROM: All within functional limits:     UPPER EXTREMITY STRENGTH:   LYMPHEDEMA ASSESSMENTS:   SURGERY TYPE/DATE:  1993: Right lumpectomy axillary dissection in [place]  (stage I) treated with radiation and tamoxifen  x 5 years 2002: Right mastectomy: Right breast recurrence 2004: Left mastectomy: 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 Developed lymphedema after this    2024 reoccurence LN's; had chemotherapy NUMBER OF LYMPH NODES REMOVED: ?  CHEMOTHERAPY: YES, last reoccurence  RADIATION:YES  HORMONE TREATMENT: YES,Neratinib /tamoxifen    INFECTIONS: NO   LYMPHEDEMA ASSESSMENTS:   LANDMARK RIGHT  eval  At axilla  30.3  15 cm proximal to olecranon process 28.3  10 cm proximal to olecranon process 26.7  Olecranon process 22.4  15 cm proximal to ulnar styloid process 20  10 cm proximal to ulnar styloid process 17.3  Just proximal to ulnar styloid process 15.1  Across hand at thumb web space 19.2  At base of 2nd digit 5.95  (Blank rows = not tested)  LANDMARK LEFT  eval Left 09/09/23 LEFT 09/18/2023  At axilla  36.0 33.9 32.2  15 cm proximal to olecranon process 35.1 32.5 32.7  10 cm proximal to olecranon process 35.0 31.3 32.2  Olecranon process 30.4 27.6 26.7  15 cm proximal to ulnar styloid process 29.8 27.8 25.7  10 cm proximal to ulnar styloid process 26.8 25.7 23.2  Just proximal to ulnar styloid process 23.4 20.4 19.7  Across hand at thumb web space 20.6 18.8 18.6  At base of 2nd digit 6.4 6 5.9  (Blank rows = not tested)  FUNCTIONAL TESTS:    GAIT: Distance walked: front desk to room 8 Assistive device utilized: Single point cane Level of assistance: Modified independence Comments: antalgic gait  LYMPHEDEMA LIFE IMPACT SCALE:  19                                                                                                                            TREATMENT DATE:  09/25/23: Manual Therapy MLD to Lt UE as follows: Short neck, superficial and deep abdominals, Lt inguinal nodes, Lt axillo-inguinal anastomosis, then focused on Lt UE working from proximal to distal then retracing all steps back to anastomosis. P/ROM gently to her Lt shoulder into flex and scaption, also gentle grade I-II mobs inferior and posterior, pt reported feeling great relief in shoulder tightness with this Compression Bandaging to Lt UE: Cocoa butter, TG soft, Molelast to fingers 1-4, Artiflex x 1 from hand to axilla, then short stretch compression bandages: 1-6 cm to hand/wrist, 1-12 cm spiral with X at elbow, and 1-12 cm spiral from wrist to axilla.   09/22/23: MLD to Lt UE as follows: Short neck, superficial and deep abdominals, Lt inguinal nodes, Lt axillo-inguinal anastomosis, then focused on Lt UE working from proximal to distal then retracing all steps back to anastomosis. Compression Bandaging to Lt UE: Cocoa butter, TG soft, Molelast to fingers 1-4, Artiflex x 1 from hand to axilla, then short stretch compression bandages: 1-6 cm to hand/wrist, 1-12 cm spiral with X at elbow, and 1-12 cm spiral from wrist to axilla.   09/18/2023 Manual Therapy Wraps removed and pt remeasured with excellent results MLD to Lt UE as follows: Short neck, superficial and deep abdominals, Lt inguinal nodes, Lt axillo-inguinal anastomosis, then focused on Lt UE working from proximal to distal then retracing all steps back to anastomosis. Compression Bandaging to Lt UE: Cocoa butter, TG soft, Molelast to fingers 1-4, Artiflex x 1 from hand to axilla, then short stretch compression bandages: 1-6 cm to hand/wrist, 1-12 cm spiral with X at elbow, and 1-12 cm spiral from wrist to axilla. Pt performed remedial exs. Answered pt questions about lymphedema while  performing MLD. Why do you get this, why is MLD so light? Discussed laundering bandages on Sun/Monday night so the stretch is renewed before her next visit. Reminded again to remove wraps if they are every too tight causingpain, numbness etc  09/15/23: Manual Therapy MLD to Lt UE as follows: Short neck, superficial and deep abdominals, Lt inguinal nodes, Lt axillo-inguinal anastomosis, then focused on Lt UE working from proximal to distal then retracing all steps back to anastomosis. Compression Bandaging to Lt UE: Cocoa butter, TG soft, Molelast to fingers 1-4, Artiflex x 1 from hand to axilla, then short stretch compression bandages: 1-6 cm to hand/wrist, 1-12 cm spiral with X at elbow, and 1-12 cm spiral from wrist to axilla.      PATIENT EDUCATION:  Education details: LOS, POC, treatment interventions Person educated: Patient Education method: Explanation Education  comprehension: verbalized understanding  HOME EXERCISE PROGRAM:   ASSESSMENT:  CLINICAL IMPRESSION: Continued CDT of Lt UE. Showed pt pulleys she could order off [employer] as she reported her arm felt so good after stretching today. Reminded her we will still need to see her after being measured for compression garments next week as she was thinking she would stop after being measured. Also answered her questions about possible physical therapy later for bil LE strength and just let her know that we would need a new order ut could address that at this clinic here or on our ortho side. Pt verbalized understanding.   OBJECTIVE IMPAIRMENTS: decreased activity tolerance, decreased knowledge of condition, decreased mobility, decreased ROM, decreased strength, increased edema, impaired UE functional use, and postural dysfunction.   ACTIVITY LIMITATIONS: carrying, lifting, standing, stairs, reach over head, and hygiene/grooming  PARTICIPATION LIMITATIONS: cleaning, shopping, and community activity  PERSONAL FACTORS: 3+  comorbidities: Left Breast Cancer with re;occurrence, radiation, chemotherapy, Lymphedema are also affecting patient's functional outcome.   REHAB POTENTIAL: Good  CLINICAL DECISION MAKING: Stable/uncomplicated  EVALUATION COMPLEXITY: Low  GOALS: Goals reviewed with patient? Yes  SHORT TERM GOALS: Target date: 09/17/2023  Pts husband will be independent with compression bandaging Baseline: Goal status:In Progress  2.  Pt will have 2 cm reduction at 10 cm above ulna styloid process Baseline:  Goal status: MET 09/18/2023  3.  Pt will have 2 cm reduction at 10 cm prox to olecranon for improved fit of clothing Baseline:  Goal status: MET 09/18/2023 4.  Pt will be tolerant of compression bandaging Baseline:  Goal status: MET 09/18/2023  LONG TERM GOALS: Target date: 10/08/2023  Pt will have decreased edema at 10 cm prox to ulna styloid process by 3 cm or greater for better fit of clothing Baseline:  Goal status:MET 09/18/2023  2.  Pt will have decreased edema at 10 cm prox to olecranon by 3 cm or greater for improved clothig fit Baseline:  Goal status: INITIAL  3.  Pt will have appropriate day and night garments to control left UE lymphedema Baseline:  Goal status: INITIAL  4.  Pt/husband will be independent in management of lymphedema Baseline:  Goal status: INITIAL   PLAN:  PT FREQUENCY: 2x/week  PT DURATION: 6 weeks  PLANNED INTERVENTIONS: 97164- PT Re-evaluation, 97110-Therapeutic exercises, 97530- Therapeutic activity, 97535- Self Care, 16109- Manual therapy, [id number]- Orthotic Initial, [id number]- Orthotic/Prosthetic subsequent, Patient/Family education, and Manual lymph drainage  PLAN FOR NEXT SESSION: Cont CDT until June 18 when pt will be measured and 1:30. Did she order pulleys?   Denyce Flank, PTA 09/25/2023, 12:01 PM

## 2023-09-29 ENCOUNTER — Ambulatory Visit

## 2023-09-29 DIAGNOSIS — I890 Lymphedema, not elsewhere classified: Secondary | ICD-10-CM

## 2023-09-29 DIAGNOSIS — Z483 Aftercare following surgery for neoplasm: Secondary | ICD-10-CM

## 2023-09-29 NOTE — Therapy (Addendum)
 OUTPATIENT PHYSICAL THERAPY  UPPER EXTREMITY ONCOLOGY TREATMENT  Patient Name: Tami Schmidt MRN: [id number] DOB:10/13/1932, 89 y.o., female Today's Date: 09/29/2023  END OF SESSION:  PT End of Session - 09/29/23 0919     Visit Number 9    Number of Visits 12    Date for PT Re-Evaluation 10/08/23    Authorization Type [employer]    Authorization Time Period 08/27/2023-10/08/2023    Authorization - Visit Number 9    Authorization - Number of Visits 12    PT Start Time 0905    PT Stop Time 0959    PT Time Calculation (min) 54 min    Activity Tolerance Patient tolerated treatment well    Behavior During Therapy [facility] for tasks assessed/performed          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   Diabetes mellitus    fasting 90-100   HTN (hypertension) 03/25/2013   Hx of radiation therapy    breasts hx   Hypercholesterolemia    Hypertension    Lymphedema of arm    left arm   Pulmonary embolism (HCC)    Type II or unspecified type diabetes mellitus without mention of complication, not stated as uncontrolled 03/25/2013   Past Surgical History:  Procedure Laterality Date   ABDOMINAL HYSTERECTOMY     ANKLE SURGERY Right 1995   APPENDECTOMY     BIOPSY  01/07/2018   Procedure: BIOPSY;  Surgeon: Genell Ken, MD;  Location: [facility];  Service: Gastroenterology;;   BREAST SURGERY Bilateral    mastectomy   COLONOSCOPY WITH PROPOFOL  N/A 01/07/2018   Procedure: COLONOSCOPY WITH PROPOFOL ;  Surgeon: Genell Ken, MD;  Location: [facility];  Service: Gastroenterology;  Laterality: N/A;   ESOPHAGOGASTRODUODENOSCOPY (EGD) WITH PROPOFOL  N/A 01/06/2018   Procedure: ESOPHAGOGASTRODUODENOSCOPY (EGD) WITH PROPOFOL ;  Surgeon: Genell Ken, MD;  Location: [facility];  Service: Gastroenterology;  Laterality: N/A;   HERNIA REPAIR  04-26-2010   IR IMAGING  GUIDED PORT INSERTION  10/29/2021   MASS EXCISION Left 01/26/2013   Procedure: EXCISION LEFT CHEST WALL MASS AND LEFT ABDOMNAL WALL MASS;  Surgeon: Rogena Class, MD;  Location: [facility];  Service: General;  Laterality: Left;   MASS EXCISION Left 09/20/2014   Procedure: EXCISION OF LEFT CHEST WALL MASS;  Surgeon: Oza Blumenthal, MD;  Location: [facility];  Service: General;  Laterality: Left;   TEE WITHOUT CARDIOVERSION N/A 12/08/2017   Procedure: TRANSESOPHAGEAL ECHOCARDIOGRAM (TEE);  Surgeon: Lenise Quince, MD;  Location[facility];  Service: Cardiovascular;  Laterality: N/A;   TOTAL HIP ARTHROPLASTY Right 01/05/2017   TOTAL HIP ARTHROPLASTY Right 01/05/2017   Procedure: TOTAL HIP ARTHROPLASTY ANTERIOR APPROACH;  Surgeon: Wendolyn Hamburger, MD;  Location: [facility];  Service: Orthopedics;  Laterality: Right;   Patient Active Problem List   Diagnosis Date Noted   Acute lower GI bleeding 01/07/2023   Rectal bleeding 01/05/2023   Acute GI bleeding 10/04/2022   Port-A-Cath in place 06/12/2022   Loss of perception for taste 03/17/2022   Primary osteoarthritis involving multiple joints 03/17/2022   Genetic testing 07/24/2021   Microcytosis 04/29/2021   Hypokalemia 04/28/2021   Pulmonary embolism (HCC)    Agnosia 08/09/2020   Hardening of the aorta (main artery of the heart) (HCC) 08/09/2020   Morbid obesity (HCC) 08/09/2020  Neuropathy 08/09/2020   Primary osteoarthritis 08/09/2020   Sensorineural hearing loss (SNHL) of both ears 08/07/2020   Anosmia 06/05/2020   Tinnitus, bilateral 06/05/2020   GI bleed 07/13/2019   ABLA (acute blood loss anemia) 07/12/2019   Microcytic anemia 01/05/2018   Melena 01/04/2018   Lower GI bleed 01/04/2018   Diabetes mellitus type II, controlled, with no complications (HCC) 12/05/2017   Tenosynovitis of left wrist 12/02/2017   Infection of left wrist (HCC) 11/30/2017   Primary osteoarthritis of right hip 01/05/2017   Osteoarthritis of  right hip 01/03/2017   Lymphedema of upper extremity 01/17/2014   Osteopenia 01/17/2014   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   Dilated pore of [place] 08/02/2013   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 08/02/2013   Scar 08/02/2013   Malignant neoplasm of lower-inner quadrant of left breast in female, estrogen receptor positive (HCC) 06/16/2013   Type II or unspecified type diabetes mellitus without mention of complication, not stated as uncontrolled 03/25/2013   Essential hypertension 03/25/2013   Chest wall recurrence of breast cancer (HCC) 02/21/2013   Abdominal wall mass 01/14/2013    PCP:   REFERRING PROVIDER: Alwin Baars, NP  REFERRING DIAG: Lymphedema s/p breast cancer  THERAPY DIAG:  Lymphedema, not elsewhere classified  Aftercare following surgery for neoplasm  ONSET DATE: 2014 with exacerbation  Rationale for Evaluation and Treatment: Rehabilitation  SUBJECTIVE:                                                                                                                                                                                           SUBJECTIVE STATEMENT:  My shoulder felt so good after you worked on it last time. It didn't bother me at all after that day. The bandages are going fine.   EVAL The lymphedema in my arm seems to be worse and the NP wanted me to come see you. She doesn't wear her sleeve much because it doesn't really compress. The night garment is still too  big also. I have not been wrapped or in the sleeve for about 6 months. She also has a Flexi touch that she uses every once in a while.  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 Pts husband has wrapped before and she has a night garment  PAIN:  Are you having pain? No  PRECAUTIONS: right THR and diabetes, prior PE , significant OA bilateral shoulders, knees, left lumpectomy; no LN's RED FLAGS: None   WEIGHT BEARING RESTRICTIONS: No, but she uses a cane for knee OA  FALLS:  Has patient fallen in last 6 months? 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  COGNITION: Overall cognitive status: Within functional limits for tasks assessed   PALPATION: Significant fibrosis noted left forearm, mild pitting noted dorsum of hand and ulnar border of forearm  OBSERVATIONS / OTHER ASSESSMENTS: pt uses SPC, antalgic gait on left  SENSATION: Light touch:    POSTURE: forward head, rounded shoulders  UPPER EXTREMITY AROM/PROM:  A/PROM RIGHT   eval   Shoulder extension   Shoulder flexion 80(OA)  Shoulder abduction   Shoulder internal rotation   Shoulder external rotation     (Blank rows = not tested)  A/PROM LEFT   eval  Shoulder extension   Shoulder flexion 60 (OA)   Shoulder abduction   Shoulder internal rotation   Shoulder external rotation     (Blank rows = not tested)  CERVICAL AROM: All within functional limits:     UPPER EXTREMITY STRENGTH:   LYMPHEDEMA ASSESSMENTS:   SURGERY TYPE/DATE:  1993: Right lumpectomy axillary dissection in [place]  (stage I) treated with radiation and tamoxifen  x 5 years 2002: Right mastectomy: Right breast recurrence 2004: Left mastectomy: 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 Developed lymphedema after this    2024 reoccurence LN's; had chemotherapy NUMBER OF LYMPH NODES REMOVED: ?  CHEMOTHERAPY: YES, last reoccurence  RADIATION:YES  HORMONE TREATMENT: YES,Neratinib /tamoxifen    INFECTIONS: NO   LYMPHEDEMA ASSESSMENTS:   LANDMARK RIGHT  eval  At axilla  30.3  15 cm proximal to olecranon process 28.3  10 cm proximal to olecranon process 26.7  Olecranon process 22.4  15 cm proximal to ulnar styloid process 20  10 cm proximal to ulnar styloid process 17.3  Just proximal to ulnar styloid process 15.1  Across hand at thumb web space 19.2  At base of 2nd digit 5.95  (Blank rows = not tested)  LANDMARK LEFT  eval Left 09/09/23 LEFT 09/18/2023  At axilla  36.0 33.9 32.2  15 cm proximal to olecranon process 35.1 32.5 32.7  10 cm proximal to olecranon process 35.0 31.3 32.2  Olecranon process 30.4 27.6 26.7  15 cm proximal to ulnar styloid process 29.8 27.8 25.7  10 cm proximal to ulnar styloid process 26.8 25.7 23.2  Just proximal to ulnar styloid process 23.4 20.4 19.7  Across hand at thumb web space 20.6 18.8 18.6  At base of 2nd digit 6.4 6 5.9  (Blank rows = not tested)   FUNCTIONAL TESTS:  GAIT: Distance walked: front desk to room 8 Assistive device utilized: Single point cane Level of assistance: Modified independence Comments: antalgic gait  LYMPHEDEMA LIFE IMPACT SCALE: 19                                                                                                                             TREATMENT DATE:  09/29/23: Manual Therapy Removed bandages and assessed skin, no redness noted, pt tolerating these well.  MLD to Lt UE as follows: Short neck, superficial and deep abdominals, Lt inguinal nodes, Lt axillo-inguinal anastomosis, then focused on Lt UE working from proximal to distal then retracing all steps back to anastomosis. P/ROM gently to her Lt shoulder into flex and scaption, also gentle grade I-II mobs inferior and posterior, pt continued to report feeling great relief in shoulder tightness with this Compression Bandaging to Lt UE: Cocoa butter, TG soft, Molelast to fingers 1-4, Artiflex x 1 from hand to axilla, then short stretch compression bandages: 1-6 cm to hand/wrist, 1-12 cm spiral with X at elbow, and 1-12 cm spiral from wrist to axilla.   09/25/23: Manual Therapy MLD to Lt UE as follows: Short neck, superficial and deep abdominals, Lt inguinal nodes, Lt axillo-inguinal anastomosis, then focused on Lt UE working from proximal to distal then retracing all steps back to anastomosis. P/ROM gently to her Lt shoulder into flex and scaption, also gentle grade I-II mobs inferior and posterior, pt reported feeling great relief in shoulder tightness with this Compression Bandaging to Lt UE: Cocoa butter, TG soft, Molelast to fingers 1-4, Artiflex x 1 from hand to axilla, then short stretch compression bandages: 1-6 cm to hand/wrist, 1-12 cm spiral with X at elbow, and 1-12 cm spiral from wrist to axilla.   09/22/23: MLD to Lt UE as follows: Short neck, superficial and deep abdominals, Lt inguinal nodes, Lt axillo-inguinal anastomosis, then focused on Lt UE working from proximal to distal then retracing all steps back to anastomosis. Compression Bandaging to Lt UE: Cocoa butter, TG soft, Molelast to fingers 1-4, Artiflex x 1 from hand to axilla, then short  stretch compression bandages: 1-6 cm to hand/wrist, 1-12 cm spiral with X at elbow, and 1-12 cm spiral from wrist to axilla.   09/18/2023 Manual Therapy Wraps removed and pt remeasured with excellent results MLD to Lt UE as follows: Short neck, superficial and deep abdominals, Lt inguinal nodes, Lt axillo-inguinal anastomosis, then focused on Lt UE working from proximal to distal then retracing all steps back to anastomosis. Compression Bandaging to Lt UE: Cocoa butter, TG soft, Molelast to fingers 1-4, Artiflex x 1 from hand to axilla, then short stretch compression bandages: 1-6 cm to hand/wrist, 1-12 cm spiral with X at elbow, and 1-12 cm spiral from wrist to axilla. Pt performed remedial exs. Answered pt questions about lymphedema while performing MLD. Why do you get this, why is MLD so light? Discussed laundering bandages on Sun/Monday night so the stretch is renewed  before her next visit. Reminded again to remove wraps if they are every too tight causingpain, numbness etc       PATIENT EDUCATION:  Education details: LOS, POC, treatment interventions Person educated: Patient Education method: Explanation Education comprehension: verbalized understanding  HOME EXERCISE PROGRAM:   ASSESSMENT:  CLINICAL IMPRESSION: Pt gets measured for new compression flat knit garments tomorrow. Continued CDT of Lt UE with Lt shoulder gentle P/ROM to her tolerance. Pt plans to get pulleys soon for AA/ROM for her Lt shoulder.   OBJECTIVE IMPAIRMENTS: decreased activity tolerance, decreased knowledge of condition, decreased mobility, decreased ROM, decreased strength, increased edema, impaired UE functional use, and postural dysfunction.   ACTIVITY LIMITATIONS: carrying, lifting, standing, stairs, reach over head, and hygiene/grooming  PARTICIPATION LIMITATIONS: cleaning, shopping, and community activity  PERSONAL FACTORS: 3+ comorbidities: Left Breast Cancer with re;occurrence, radiation,  chemotherapy, Lymphedema are also affecting patient's functional outcome.   REHAB POTENTIAL: Good  CLINICAL DECISION MAKING: Stable/uncomplicated  EVALUATION COMPLEXITY: Low  GOALS: Goals reviewed with patient? Yes  SHORT TERM GOALS: Target date: 09/17/2023  Pts husband will be independent with compression bandaging Baseline: Goal status:In Progress  2.  Pt will have 2 cm reduction at 10 cm above ulna styloid process Baseline:  Goal status: MET 09/18/2023  3.  Pt will have 2 cm reduction at 10 cm prox to olecranon for improved fit of clothing Baseline:  Goal status: MET 09/18/2023 4.  Pt will be tolerant of compression bandaging Baseline:  Goal status: MET 09/18/2023  LONG TERM GOALS: Target date: 10/08/2023  Pt will have decreased edema at 10 cm prox to ulna styloid process by 3 cm or greater for better fit of clothing Baseline:  Goal status:MET 09/18/2023  2.  Pt will have decreased edema at 10 cm prox to olecranon by 3 cm or greater for improved clothig fit Baseline:  Goal status: INITIAL  3.  Pt will have appropriate day and night garments to control left UE lymphedema Baseline:  Goal status: INITIAL  4.  Pt/husband will be independent in management of lymphedema Baseline:  Goal status: INITIAL   PLAN:  PT FREQUENCY: 2x/week  PT DURATION: 6 weeks  PLANNED INTERVENTIONS: 97164- PT Re-evaluation, 97110-Therapeutic exercises, 97530- Therapeutic activity, 97535- Self Care, 16109- Manual therapy, [id number]- Orthotic Initial, [id number]- Orthotic/Prosthetic subsequent, Patient/Family education, and Manual lymph drainage  PLAN FOR NEXT SESSION: How was getting measured? Cont CDT until new garments arrive. Did she order pulleys? Pt plans to come less after being measured and husband will bandage her at home. Instruct him if he comes next session.   Addendum: Pt was measured on 09/30/23 for custom daytime flat knit Mediven 550 Lt UE sleeve and glove x 2 and custom Circaid Profile  nighttime garment x 1 that she will need x 30 years  Denyce Flank, PTA 09/29/2023, 10:05 AM

## 2023-10-02 ENCOUNTER — Ambulatory Visit

## 2023-10-02 DIAGNOSIS — Z483 Aftercare following surgery for neoplasm: Secondary | ICD-10-CM

## 2023-10-02 DIAGNOSIS — I890 Lymphedema, not elsewhere classified: Secondary | ICD-10-CM

## 2023-10-02 NOTE — Therapy (Signed)
 OUTPATIENT PHYSICAL THERAPY  UPPER EXTREMITY ONCOLOGY TREATMENT  Patient Name: Tami Schmidt MRN: [id number] DOB:[birth year], 89 y.o., female Today's Date: 10/02/2023  END OF SESSION:  PT End of Session - 10/02/23 0910     Visit Number 10    Number of Visits 18    Date for PT Re-Evaluation 11/27/23    Authorization Type [employer]    Authorization Time Period 08/27/2023-10/08/2023    Authorization - Visit Number 10    Authorization - Number of Visits 12    PT Start Time 0906    PT Stop Time 1000    PT Time Calculation (min) 54 min    Activity Tolerance Patient tolerated treatment well    Behavior During Therapy WFL for tasks assessed/performed          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   Diabetes mellitus    fasting 90-100   HTN (hypertension) 03/25/2013   Hx of radiation therapy    breasts hx   Hypercholesterolemia    Hypertension    Lymphedema of arm    left arm   Pulmonary embolism (HCC)    Type II or unspecified type diabetes mellitus without mention of complication, not stated as uncontrolled 03/25/2013   Past Surgical History:  Procedure Laterality Date   ABDOMINAL HYSTERECTOMY     ANKLE SURGERY Right 1995   APPENDECTOMY     BIOPSY  01/07/2018   Procedure: BIOPSY;  Surgeon: Genell Ken, MD;  Location: [facility];  Service: Gastroenterology;;   BREAST SURGERY Bilateral    mastectomy   COLONOSCOPY WITH PROPOFOL  N/A 01/07/2018   Procedure: COLONOSCOPY WITH PROPOFOL ;  Surgeon: Genell Ken, MD;  Location: [facility];  Service: Gastroenterology;  Laterality: N/A;   ESOPHAGOGASTRODUODENOSCOPY (EGD) WITH PROPOFOL  N/A 01/06/2018   Procedure: ESOPHAGOGASTRODUODENOSCOPY (EGD) WITH PROPOFOL ;  Surgeon: Genell Ken, MD;  Location: [facility];  Service: Gastroenterology;  Laterality: N/A;   HERNIA REPAIR  04-26-2010   IR IMAGING  GUIDED PORT INSERTION  10/29/2021   MASS EXCISION Left 01/26/2013   Procedure: EXCISION LEFT CHEST WALL MASS AND LEFT ABDOMNAL WALL MASS;  Surgeon: Rogena Class, MD;  Location: [facility];  Service: General;  Laterality: Left;   MASS EXCISION Left 09/20/2014   Procedure: EXCISION OF LEFT CHEST WALL MASS;  Surgeon: Oza Blumenthal, MD;  Location: [facility];  Service: General;  Laterality: Left;   TEE WITHOUT CARDIOVERSION N/A 12/08/2017   Procedure: TRANSESOPHAGEAL ECHOCARDIOGRAM (TEE);  Surgeon: Lenise Quince, MD;  Location: [facility];  Service: Cardiovascular;  Laterality: N/A;   TOTAL HIP ARTHROPLASTY Right 01/05/2017   TOTAL HIP ARTHROPLASTY Right 01/05/2017   Procedure: TOTAL HIP ARTHROPLASTY ANTERIOR APPROACH;  Surgeon: Wendolyn Hamburger, MD;  Location: [facility];  Service: Orthopedics;  Laterality: Right;   Patient Active Problem List   Diagnosis Date Noted   Acute lower GI bleeding 01/07/2023   Rectal bleeding 01/05/2023   Acute GI bleeding 10/04/2022   Port-A-Cath in place 06/12/2022   Loss of perception for taste 03/17/2022   Primary osteoarthritis involving multiple joints 03/17/2022   Genetic testing 07/24/2021   Microcytosis 04/29/2021   Hypokalemia 04/28/2021   Pulmonary embolism (HCC)    Agnosia 08/09/2020   Hardening of the aorta (main artery of the heart) (HCC) 08/09/2020   Morbid obesity (HCC) 08/09/2020  Neuropathy 08/09/2020   Primary osteoarthritis 08/09/2020   Sensorineural hearing loss (SNHL) of both ears 08/07/2020   Anosmia 06/05/2020   Tinnitus, bilateral 06/05/2020   GI bleed 07/13/2019   ABLA (acute blood loss anemia) 07/12/2019   Microcytic anemia 01/05/2018   Melena 01/04/2018   Lower GI bleed 01/04/2018   Diabetes mellitus type II, controlled, with no complications (HCC) 12/05/2017   Tenosynovitis of left wrist 12/02/2017   Infection of left wrist (HCC) 11/30/2017   Primary osteoarthritis of right hip 01/05/2017   Osteoarthritis of  right hip 01/03/2017   Lymphedema of upper extremity 01/17/2014   Osteopenia 01/17/2014   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   Dilated pore of [place]iner 08/02/2013   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 08/02/2013   Scar 08/02/2013   Malignant neoplasm of lower-inner quadrant of left breast in female, estrogen receptor positive (HCC) 06/16/2013   Type II or unspecified type diabetes mellitus without mention of complication, not stated as uncontrolled 03/25/2013   Essential hypertension 03/25/2013   Chest wall recurrence of breast cancer (HCC) 02/21/2013   Abdominal wall mass 01/14/2013    PCP:   REFERRING PROVIDER: Alwin Baars, NP  REFERRING DIAG: Lymphedema s/p breast cancer  THERAPY DIAG:  Lymphedema, not elsewhere classified  Aftercare following surgery for neoplasm  ONSET DATE: 2014 with exacerbation  Rationale for Evaluation and Treatment: Rehabilitation  SUBJECTIVE:                                                                                                                                                                                           SUBJECTIVE STATEMENT:  I took the wrap off yesterday and washed them . I tried to wear my sleeve but it is too big.  EVAL The lymphedema in my arm seems to be worse and the NP wanted me to come see you. She doesn't wear her sleeve much because it doesn't really compress. The night garment is still too  big also. I have not been wrapped or in the sleeve for about 6 months. She also has a Flexi touch that she uses every once in a while.  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 Pts husband has wrapped before and she has a night garment  PAIN:  Are you having pain? No  PRECAUTIONS: right THR and diabetes, prior PE , significant OA bilateral shoulders, knees, left lumpectomy; no LN's RED FLAGS: None   WEIGHT BEARING RESTRICTIONS: No, but she uses a cane for knee OA  FALLS:  Has patient fallen in last 6 months? 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  COGNITION: Overall cognitive status: Within functional limits for tasks assessed   PALPATION: Significant fibrosis noted left forearm, mild pitting noted dorsum of hand and ulnar border of forearm  OBSERVATIONS / OTHER ASSESSMENTS: pt uses SPC, antalgic gait on left  SENSATION: Light touch:    POSTURE: forward head, rounded shoulders  UPPER EXTREMITY AROM/PROM:  A/PROM RIGHT   eval   Shoulder extension   Shoulder flexion 80(OA)  Shoulder abduction   Shoulder internal rotation   Shoulder external rotation     (Blank rows = not tested)  A/PROM LEFT   eval  Shoulder extension   Shoulder flexion 60 (OA)  Shoulder abduction   Shoulder internal  rotation   Shoulder external rotation     (Blank rows = not tested)  CERVICAL AROM: All within functional limits:     UPPER EXTREMITY STRENGTH:   LYMPHEDEMA ASSESSMENTS:   SURGERY TYPE/DATE:  1993: Right lumpectomy axillary dissection in [place]  (stage I) treated with radiation and tamoxifen  x 5 years 2002: Right mastectomy: Right breast recurrence 2004: Left mastectomy: 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 Developed lymphedema after this    2024 reoccurence LN's; had chemotherapy NUMBER OF LYMPH NODES REMOVED: ?  CHEMOTHERAPY: YES, last reoccurence  RADIATION:YES  HORMONE TREATMENT: YES,Neratinib /tamoxifen    INFECTIONS: NO   LYMPHEDEMA ASSESSMENTS:   LANDMARK RIGHT  eval  At axilla  30.3  15 cm proximal to olecranon process 28.3  10 cm proximal to olecranon process 26.7  Olecranon process 22.4  15 cm proximal to ulnar styloid process 20  10 cm proximal to ulnar styloid process 17.3  Just proximal to ulnar styloid process 15.1  Across hand at thumb web space 19.2  At base of 2nd digit 5.95  (Blank rows = not tested)  LANDMARK LEFT  eval Left 09/09/23 LEFT 09/18/2023  At axilla  36.0 33.9 32.2  15 cm proximal to olecranon process 35.1 32.5 32.7  10 cm proximal to olecranon process 35.0 31.3 32.2  Olecranon process 30.4 27.6 26.7  15 cm proximal to ulnar styloid process 29.8 27.8 25.7  10 cm proximal to ulnar styloid process 26.8 25.7 23.2  Just proximal to ulnar styloid process 23.4 20.4 19.7  Across hand at thumb web space 20.6 18.8 18.6  At base of 2nd digit 6.4 6 5.9  (Blank rows = not tested)   FUNCTIONAL TESTS:    GAIT: Distance walked: front desk  to room 8 Assistive device utilized: Single point cane Level of assistance: Modified independence Comments: antalgic gait  LYMPHEDEMA LIFE IMPACT SCALE: 19                                                                                                                             TREATMENT DATE:   10/02/2023 Pt arrived unwrapped. Wrist and hand area very swollen and ulnar border fibrotic MLD to Lt UE as follows: Short neck, superficial and deep abdominals, Lt inguinal nodes, Lt axillo-inguinal anastomosis, then focused on Lt UE working from proximal to distal then retracing all steps back to anastomosis. Compression Bandaging to Lt UE: Cocoa butter, TG soft, Molelast to fingers 1-4, Artiflex x 1 from hand to axilla, then short stretch compression bandages: 1-6 cm to hand/wrist, 1-12 cm spiral with X at elbow, and 1-12 cm spiral from wrist to axilla.  Discussed the night garment she has at home and determined she was trying to wear the over sleeve under the garment. She will try at home with the sleeve on top to see if it fits better.  She would like to be released from PT and her husband may wrap her, however, we have not seen him wrap. Discussed he can wrap her before her next appt and we can check when she comes in. It would be helpful to have him attend her appt so we may instruct him prn. May benefit from continuing PT atleast 1x per week until she receives her new garments. 09/29/23: Manual Therapy Removed bandages and assessed skin, no redness noted, pt tolerating these well.  MLD to Lt UE as follows: Short neck, superficial and deep abdominals, Lt inguinal nodes, Lt axillo-inguinal anastomosis, then focused on Lt UE working from proximal to distal then retracing all steps back to anastomosis. P/ROM gently to her Lt shoulder into flex and scaption, also gentle grade I-II mobs inferior and posterior, pt continued to report feeling great relief in shoulder tightness with this Compression Bandaging to Lt UE: Cocoa butter, TG soft, Molelast to fingers 1-4, Artiflex x 1 from hand to axilla, then short stretch compression bandages: 1-6 cm to hand/wrist, 1-12 cm spiral with X at elbow, and 1-12 cm  spiral from wrist to axilla.   09/25/23: Manual Therapy MLD to Lt UE as follows: Short neck, superficial and deep abdominals, Lt inguinal nodes, Lt axillo-inguinal anastomosis, then focused on Lt UE working from proximal to distal then retracing all steps back to anastomosis. P/ROM gently to her Lt shoulder into flex and scaption, also gentle grade I-II mobs inferior and posterior, pt reported feeling great relief in shoulder tightness with this Compression Bandaging to Lt UE: Cocoa butter, TG soft, Molelast to fingers 1-4, Artiflex x 1 from hand to axilla, then short stretch compression bandages: 1-6 cm to hand/wrist, 1-12 cm spiral with X at elbow, and 1-12 cm spiral from wrist to axilla.   09/22/23: MLD to Lt UE as follows: Short  neck, superficial and deep abdominals, Lt inguinal nodes, Lt axillo-inguinal anastomosis, then focused on Lt UE working from proximal to distal then retracing all steps back to anastomosis. Compression Bandaging to Lt UE: Cocoa butter, TG soft, Molelast to fingers 1-4, Artiflex x 1 from hand to axilla, then short stretch compression bandages: 1-6 cm to hand/wrist, 1-12 cm spiral with X at elbow, and 1-12 cm spiral from wrist to axilla.   09/18/2023 Manual Therapy Wraps removed and pt remeasured with excellent results MLD to Lt UE as follows: Short neck, superficial and deep abdominals, Lt inguinal nodes, Lt axillo-inguinal anastomosis, then focused on Lt UE working from proximal to distal then retracing all steps back to anastomosis. Compression Bandaging to Lt UE: Cocoa butter, TG soft, Molelast to fingers 1-4, Artiflex x 1 from hand to axilla, then short stretch compression bandages: 1-6 cm to hand/wrist, 1-12 cm spiral with X at elbow, and 1-12 cm spiral from wrist to axilla. Pt performed remedial exs. Answered pt questions about lymphedema while performing MLD. Why do you get this, why is MLD so light? Discussed laundering bandages on Sun/Monday night so the  stretch is renewed before her next visit. Reminded again to remove wraps if they are every too tight causingpain, numbness etc       PATIENT EDUCATION:  Education details: LOS, POC, treatment interventions Person educated: Patient Education method: Explanation Education comprehension: verbalized understanding  HOME EXERCISE PROGRAM:   ASSESSMENT:  CLINICAL IMPRESSION: Pt has met reduction goals and has been measured for new garments, however, her husband has not come in to review bandaging.  Pt would like to discontinue therapy and have her husband wrap until her new garments come. Concern is that he has not done so in several years. Advised he may wrap her before next appt, and come to appt with her to be instructed and if he can wrap well she will not need to come for too many visits while awaiting her new garments. Explained that we don't want her arm to swell significantly so her garments won't fit. Until she receives her new garments we can not determine her independence with self care. She understands our concern and is agreeable.  OBJECTIVE IMPAIRMENTS: decreased activity tolerance, decreased knowledge of condition, decreased mobility, decreased ROM, decreased strength, increased edema, impaired UE functional use, and postural dysfunction.   ACTIVITY LIMITATIONS: carrying, lifting, standing, stairs, reach over head, and hygiene/grooming  PARTICIPATION LIMITATIONS: cleaning, shopping, and community activity  PERSONAL FACTORS: 3+ comorbidities: Left Breast Cancer with re;occurrence, radiation, chemotherapy, Lymphedema are also affecting patient's functional outcome.   REHAB POTENTIAL: Good  CLINICAL DECISION MAKING: Stable/uncomplicated  EVALUATION COMPLEXITY: Low  GOALS: Goals reviewed with patient? Yes  SHORT TERM GOALS: Target date: 09/17/2023  Pts husband will be independent with compression bandaging Baseline: Goal status:In Progress  2.  Pt will have 2 cm  reduction at 10 cm above ulna styloid process Baseline:  Goal status: MET 09/18/2023  3.  Pt will have 2 cm reduction at 10 cm prox to olecranon for improved fit of clothing Baseline:  Goal status: MET 09/18/2023 4.  Pt will be tolerant of compression bandaging Baseline:  Goal status: MET 09/18/2023  LONG TERM GOALS: Target date: 10/08/2023  Pt will have decreased edema at 10 cm prox to ulna styloid process by 3 cm or greater for better fit of clothing Baseline:  Goal status:MET 09/18/2023  2.  Pt will have decreased edema at 10 cm prox to olecranon by 3 cm or  greater for improved clothig fit Baseline:  Goal status:MET  09/09/2023 3.  Pt will have appropriate day and night garments to control left UE lymphedema Baseline:  Goal status: In Progress Measured but not yet received  4.  Pt/husband will be independent in management of lymphedema Baseline:  Goal status: In Progress   PLAN:  PT FREQUENCY: 1 x/week prn  PT DURATION: 8 weeks, or until she receives appropriate day ad night garments to be independent.  PLANNED INTERVENTIONS: 97164- PT Re-evaluation, 97110-Therapeutic exercises, 97530- Therapeutic activity, 97535- Self Care, 21308- Manual therapy, [id number]- Orthotic Initial, [phone]- Orthotic/Prosthetic subsequent, Patient/Family education, and Manual lymph drainage  PLAN FOR NEXT SESSION: How was getting measured? Cont CDT until new garments arrive. Did she order pulleys? Pt plans to come less after being measured and husband will bandage her at home. Instruct him if he comes next session.   Addendum: Pt was measured on 09/30/23 for custom daytime flat knit Mediven 550 Lt UE sleeve and glove x 2 and custom Circaid Profile nighttime garment x 1 that she will need x 30 years  Latisha Poland, PT 10/02/2023, 12:37 PM

## 2023-10-03 IMAGING — CT CT CHEST-ABD-PELV W/ CM
3 of 5 series · 14 of 36 positions shown, 16 images · IV contrast (APPLIED)
Comparison: CT chest 07/19/2021, CT abdomen pelvis 05/06/2021 and
bone scan 05/06/2021.

CLINICAL DATA: Stage IV breast cancer, assess treatment response. *
Tracking Code: BO *

EXAM:
CT CHEST, ABDOMEN, AND PELVIS WITH CONTRAST
TECHNIQUE: Multidetector CT imaging of the chest, abdomen and pelvis was
performed following the standard protocol during bolus
administration of intravenous contrast.

[Series 2: cap with (person_name) · axial · 0.98mm/px · z∈[-362,+128]mm · 9 of 124 slices shown, 11 images]
[im 13/124  mediastinal]
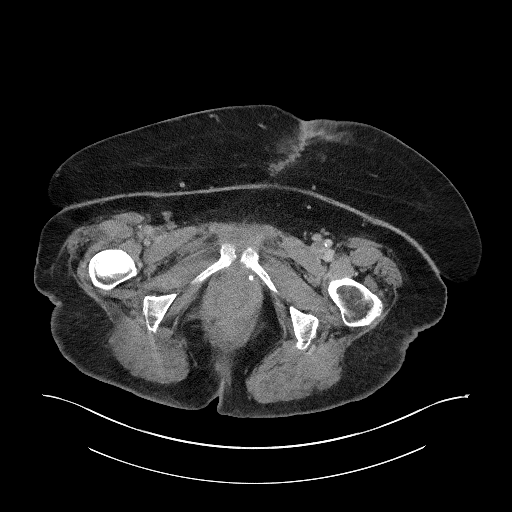
[im 13/124  bone]
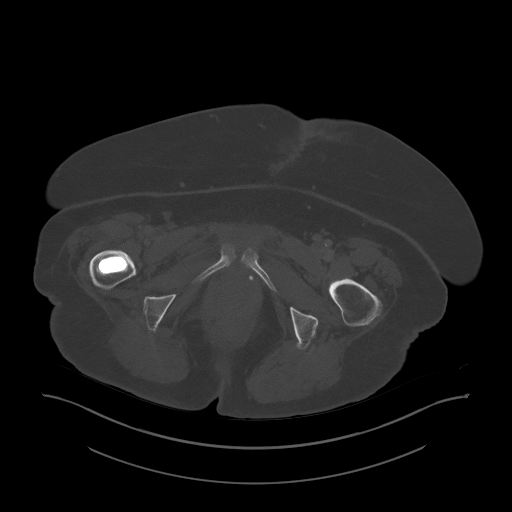
[im 25/124  mediastinal]
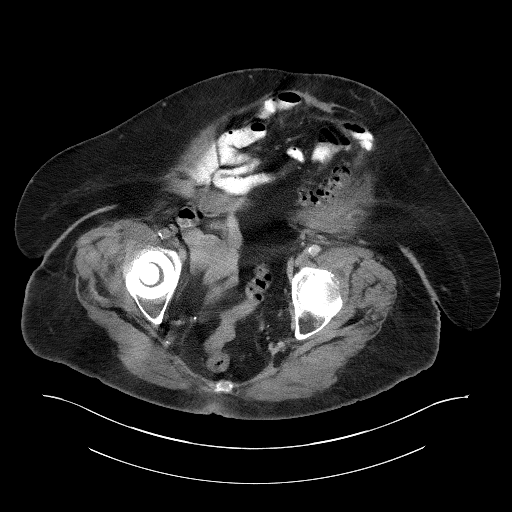
[im 37/124  mediastinal]
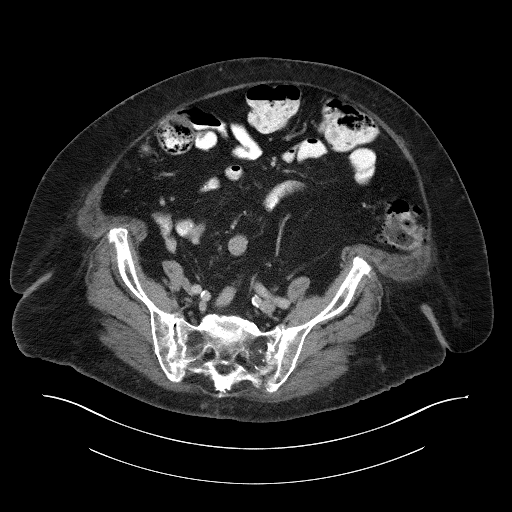
[im 50/124  mediastinal]
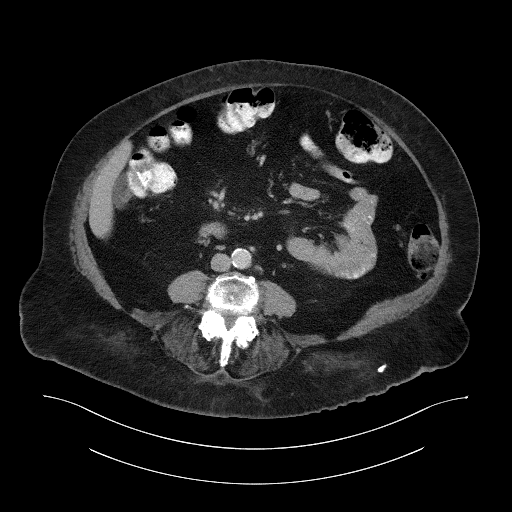
[im 62/124  mediastinal]
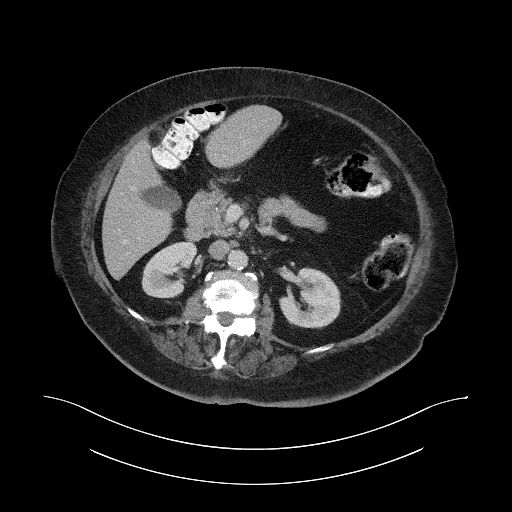
[im 74/124  mediastinal]
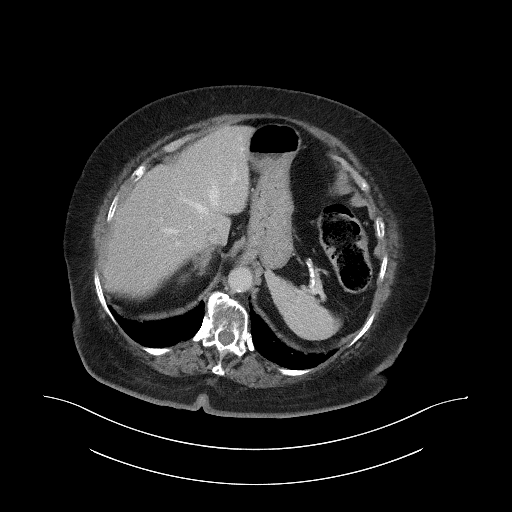
[im 87/124  mediastinal]
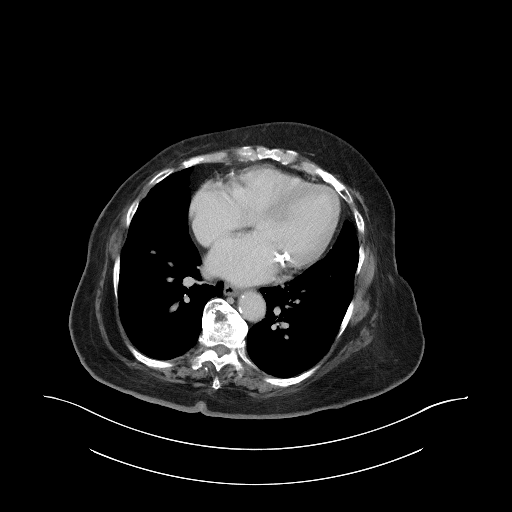
[im 99/124  mediastinal]
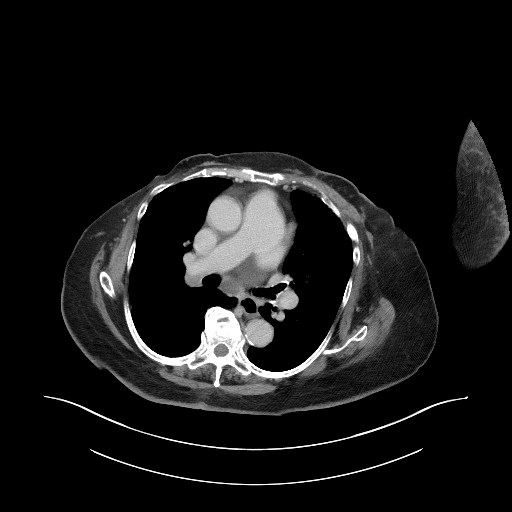
[im 111/124  mediastinal]
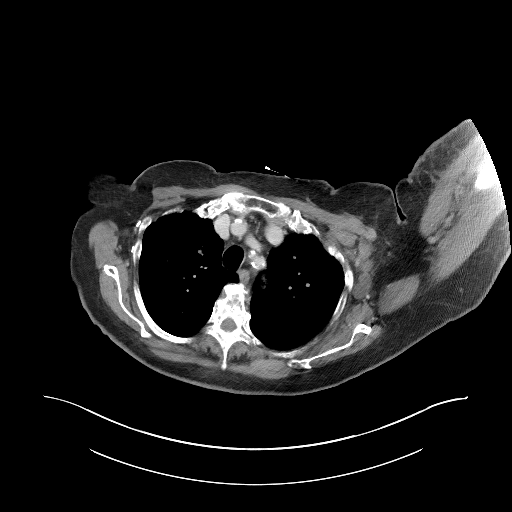
[im 111/124  bone]
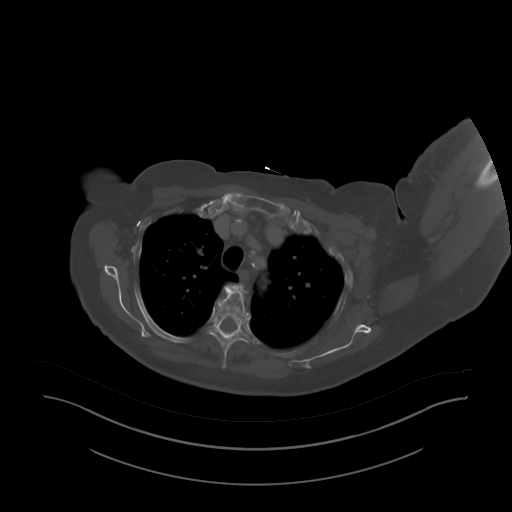

[Series 4: lung · axial · 0.98mm/px · z∈[-93,-45]mm · 2 of 155 slices shown]
[im 12/155  bone]
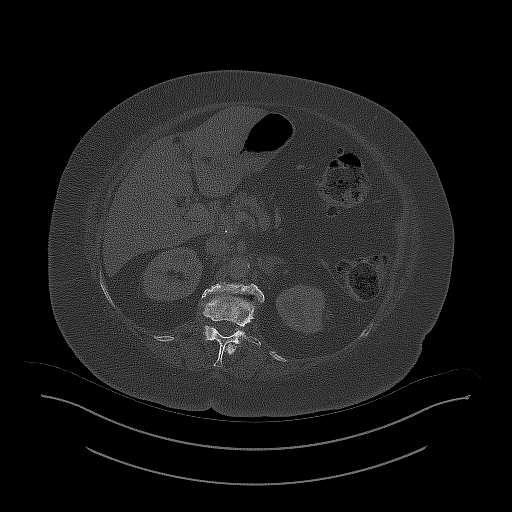
[im 36/155  bone]
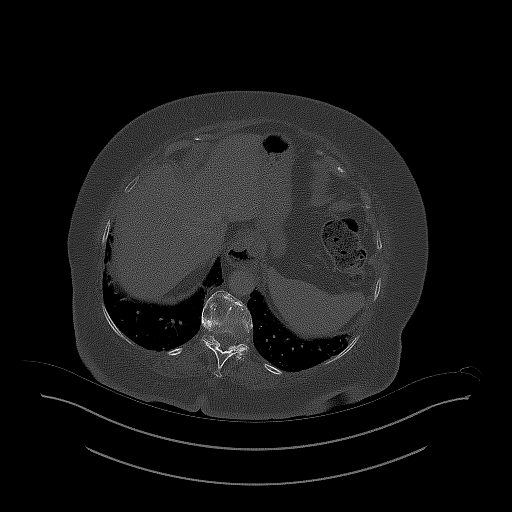

[Series 5: coronals (person_name) · coronal · 0.94mm/px · 3 of 214 slices shown]
[im 43/214  mediastinal]
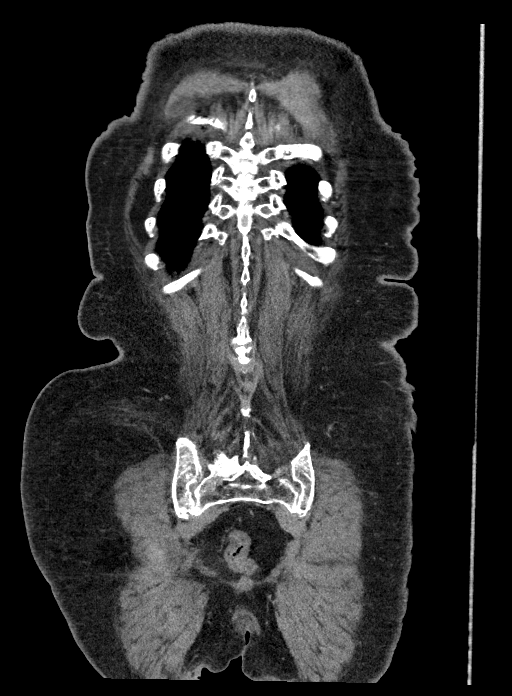
[im 86/214  mediastinal]
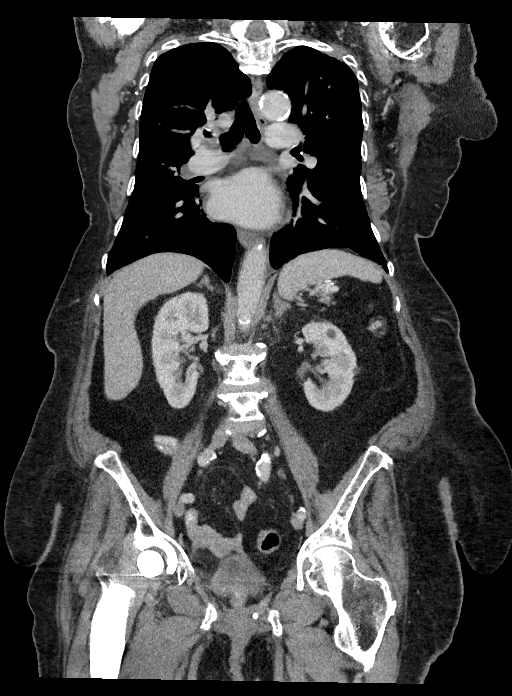
[im 128/214  mediastinal]
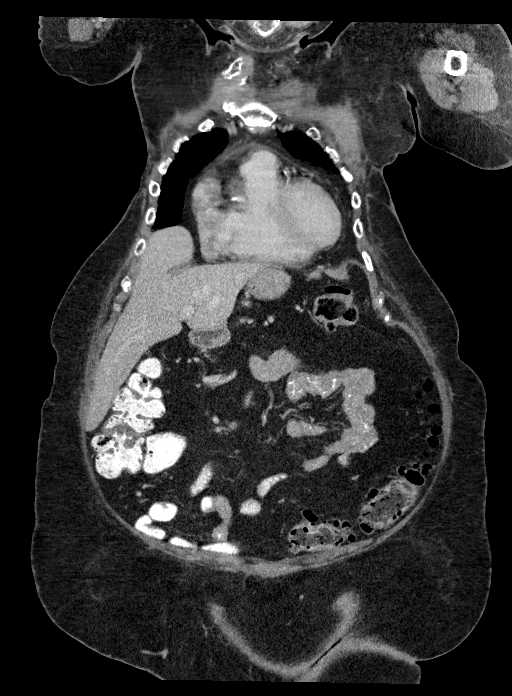

[14 of 36 positions shown; findings below may reference images not displayed]

RADIATION DOSE REDUCTION: This exam was performed according to the
departmental dose-optimization program which includes automated
exposure control, adjustment of the mA and/or kV according to
patient size and/or use of iterative reconstruction technique.

CONTRAST:  100mL OMNIPAQUE IOHEXOL 300 MG/ML  SOLN
FINDINGS: CT CHEST FINDINGS

Cardiovascular: Atherosclerotic calcification of the aorta, aortic
valve and coronary arteries. Pulmonic trunk and heart are enlarged.
No pericardial effusion.

Mediastinum/Nodes: 2.0 cm low-attenuation lesion in the left
thyroid. Necrotic appearing 1.7 cm subcarinal lymph node, similar.
Right hilar lymph nodes measure up to 11 mm, similar. Left axillary
adenopathy measures up to 1.8 cm, similar. Partially calcified left
subpectoral adenopathy, 1.7 cm, stable. Surgical clips in the right
axilla. Esophagus is somewhat dilated throughout its course,
suggesting dysmotility. Small hiatal hernia.

Lungs/Pleura: Mild centrilobular and paraseptal emphysema. Scattered
bibasilar scarring. 5 mm posterior right lower lobe nodule (4/126),
unchanged. 3 mm posterior right lower lobe nodule (4/113), is
smaller, previously measuring 5 mm. Nearly completely resolved
peribronchovascular nodularity in the right middle lobe. Subpleural
radiation scarring in the anterior left upper lobe. No pleural
fluid. Airway is unremarkable.

Musculoskeletal: Degenerative changes in the spine. Right
mastectomy. Advanced degenerative changes in the shoulders. No
worrisome lytic or sclerotic lesions. Healed left rib fractures.

CT ABDOMEN PELVIS FINDINGS

Hepatobiliary: Liver is enlarged, 21.0 cm. Stones layer in the
gallbladder. No biliary ductal dilatation.

Pancreas: Negative.

Spleen: Negative.

Adrenals/Urinary Tract: Adrenal glands are unremarkable.
Subcentimeter low-attenuation lesions in the kidneys are too small
to characterize. No specific follow-up necessary. Ureters are
decompressed. Bladder is grossly unremarkable although there may be
a small cystocele.

Stomach/Bowel: Small hiatal hernia. Stomach, small bowel and colon
are unremarkable. Appendix is not readily visualized.

Vascular/Lymphatic: Atherosclerotic calcification of the aorta.
Dilated celiac trunk again noted, suggesting a proximal stenosis. No
pathologically enlarged lymph nodes. Partially calcified periportal
lymph nodes.

Reproductive: Hysterectomy.  No adnexal mass.

Other: No free fluid.  Mesenteries and peritoneum are unremarkable.

Musculoskeletal: Degenerative changes in the spine and left hip. No
worrisome lytic or sclerotic lesions.
IMPRESSION: 1. Stable subcarinal, right hilar, left subpectoral and left
axillary adenopathy.
2. Two right lower lobe nodules, 1 of which appears decreased in
size.
3. Hepatomegaly.
4. Cholelithiasis.
5. 2 cm low-attenuation left thyroid nodule. Recommend thyroid
ultrasound. (Ref: [HOSPITAL]. [DATE]): 143-50).
6. Dilated celiac trunk, suggesting a proximal stenosis.
7. Aortic atherosclerosis (L5WV4-[id number].P). Coronary artery
calcification.
8. Enlarged pulmonic trunk, indicative of pulmonary arterial
hypertension.
9.  Emphysema (L5WV4-GSG.2).

## 2023-10-05 ENCOUNTER — Ambulatory Visit
Admission: RE | Admit: 2023-10-05 | Discharge: 2023-10-05 | Disposition: A | Discharge status: Home / Self Care | Source: Ambulatory Visit | Attending: Adult Health | Admitting: Adult Health

## 2023-10-05 DIAGNOSIS — C50312 Malignant neoplasm of lower-inner quadrant of left female breast: Secondary | ICD-10-CM

## 2023-10-05 MED ORDER — IOPAMIDOL (ISOVUE-370) INJECTION 76%
80.0000 mL | Freq: Once | INTRAVENOUS | Status: AC | PRN
Start: 2023-10-05 — End: 2023-10-05
  Administered 2023-10-05: 80 mL via INTRAVENOUS

## 2023-10-06 ENCOUNTER — Other Ambulatory Visit: Payer: Self-pay | Admitting: Adult Health

## 2023-10-06 DIAGNOSIS — M25511 Pain in right shoulder: Secondary | ICD-10-CM

## 2023-10-06 DIAGNOSIS — I890 Lymphedema, not elsewhere classified: Secondary | ICD-10-CM

## 2023-10-06 DIAGNOSIS — Z170 Estrogen receptor positive status [ER+]: Secondary | ICD-10-CM

## 2023-10-06 DIAGNOSIS — C50919 Malignant neoplasm of unspecified site of unspecified female breast: Secondary | ICD-10-CM

## 2023-10-07 ENCOUNTER — Ambulatory Visit: Payer: Self-pay

## 2023-10-07 DIAGNOSIS — Z483 Aftercare following surgery for neoplasm: Secondary | ICD-10-CM

## 2023-10-07 DIAGNOSIS — I890 Lymphedema, not elsewhere classified: Secondary | ICD-10-CM | POA: Diagnosis not present

## 2023-10-07 NOTE — Therapy (Addendum)
 OUTPATIENT PHYSICAL THERAPY  UPPER EXTREMITY ONCOLOGY TREATMENT  Patient Name: Tami Schmidt MRN: [id number] DOB:[birth year], 89 y.o., female Today's Date: 10/07/2023  END OF SESSION:  PT End of Session - 10/07/23 0917     Visit Number 11    Number of Visits 18    Date for PT Re-Evaluation 11/27/23    Authorization Type [employer]    Authorization Time Period 08/27/2023-10/08/2023 (new auth sumitted 10/06/23)    Authorization - Visit Number 11    Authorization - Number of Visits 12    PT Start Time 0908    PT Stop Time 1003    PT Time Calculation (min) 55 min    Activity Tolerance Patient tolerated treatment well    Behavior During Therapy WFL for tasks assessed/performed          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   Diabetes mellitus    fasting 90-100   HTN (hypertension) 03/25/2013   Hx of radiation therapy    breasts hx   Hypercholesterolemia    Hypertension    Lymphedema of arm    left arm   Pulmonary embolism (HCC)    Type II or unspecified type diabetes mellitus without mention of complication, not stated as uncontrolled 03/25/2013   Past Surgical History:  Procedure Laterality Date   ABDOMINAL HYSTERECTOMY     ANKLE SURGERY Right 1995   APPENDECTOMY     BIOPSY  01/07/2018   Procedure: BIOPSY;  Surgeon: Saintclair Jasper, MD;  Location: [facility];  Service: Gastroenterology;;   BREAST SURGERY Bilateral    mastectomy   COLONOSCOPY WITH PROPOFOL  N/A 01/07/2018   Procedure: COLONOSCOPY WITH PROPOFOL ;  Surgeon: Saintclair Jasper, MD;  Location: [facility];  Service: Gastroenterology;  Laterality: N/A;   ESOPHAGOGASTRODUODENOSCOPY (EGD) WITH PROPOFOL  N/A 01/06/2018   Procedure: ESOPHAGOGASTRODUODENOSCOPY (EGD) WITH PROPOFOL ;  Surgeon: Saintclair Jasper, MD;  Location: [facility];  Service: Gastroenterology;  Laterality: N/A;   HERNIA REPAIR   04-26-2010   IR IMAGING GUIDED PORT INSERTION  10/29/2021   MASS EXCISION Left 01/26/2013   Procedure: EXCISION LEFT CHEST WALL MASS AND LEFT ABDOMNAL WALL MASS;  Surgeon: Vicenta DELENA Poli, MD;  Location: [facility];  Service: General;  Laterality: Left;   MASS EXCISION Left 09/20/2014   Procedure: EXCISION OF LEFT CHEST WALL MASS;  Surgeon: Vicenta Poli, MD;  Location: [facility];  Service: General;  Laterality: Left;   TEE WITHOUT CARDIOVERSION N/A 12/08/2017   Procedure: TRANSESOPHAGEAL ECHOCARDIOGRAM (TEE);  Surgeon: Pietro Redell RAMAN, MD;  Location: [facility];  Service: Cardiovascular;  Laterality: N/A;   TOTAL HIP ARTHROPLASTY Right 01/05/2017   TOTAL HIP ARTHROPLASTY Right 01/05/2017   Procedure: TOTAL HIP ARTHROPLASTY ANTERIOR APPROACH;  Surgeon: Liam Lerner, MD;  Location: [facility];  Service: Orthopedics;  Laterality: Right;   Patient Active Problem List   Diagnosis Date Noted   Acute lower GI bleeding 01/07/2023   Rectal bleeding 01/05/2023   Acute GI bleeding 10/04/2022   Port-A-Cath in place 06/12/2022   Loss of perception for taste 03/17/2022   Primary osteoarthritis involving multiple joints 03/17/2022   Genetic testing 07/24/2021   Microcytosis 04/29/2021   Hypokalemia 04/28/2021   Pulmonary embolism (HCC)    Agnosia 08/09/2020   Hardening of the aorta (main artery of the heart) (HCC) 08/09/2020   Morbid  obesity (HCC) 08/09/2020   Neuropathy 08/09/2020   Primary osteoarthritis 08/09/2020   Sensorineural hearing loss (SNHL) of both ears 08/07/2020   Anosmia 06/05/2020   Tinnitus, bilateral 06/05/2020   GI bleed 07/13/2019   ABLA (acute blood loss anemia) 07/12/2019   Microcytic anemia 01/05/2018   Melena 01/04/2018   Lower GI bleed 01/04/2018   Diabetes mellitus type II, controlled, with no complications (HCC) 12/05/2017   Tenosynovitis of left wrist 12/02/2017   Infection of left wrist (HCC) 11/30/2017   Primary osteoarthritis of right hip 01/05/2017    Osteoarthritis of right hip 01/03/2017   Lymphedema of upper extremity 01/17/2014   Osteopenia 01/17/2014   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   Dilated pore of Winer 08/02/2013   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 08/02/2013   Scar 08/02/2013   Malignant neoplasm of lower-inner quadrant of left breast in female, estrogen receptor positive (HCC) 06/16/2013   Type II or unspecified type diabetes mellitus without mention of complication, not stated as uncontrolled 03/25/2013   Essential hypertension 03/25/2013   Chest wall recurrence of breast cancer (HCC) 02/21/2013   Abdominal wall mass 01/14/2013    PCP:   REFERRING PROVIDER: Morna Kendall, NP  REFERRING DIAG: Lymphedema s/p breast cancer  THERAPY DIAG:  Lymphedema, not elsewhere classified  Aftercare following surgery for neoplasm  ONSET DATE: 2014 with exacerbation  Rationale for Evaluation and Treatment: Rehabilitation  SUBJECTIVE:                                                                                                                                                                                           SUBJECTIVE STATEMENT:  I'll come as much as you tell me to until my garment arrives. My hand looks real good! And my Lt shoulder hasn't hurt at all since you first stretched me a few sessions ago. I ordered the pulleys from [employer] so I can keep working on my motion at home.   EVAL The lymphedema in my arm seems to be worse and the NP wanted me to come see you. She doesn't wear her sleeve much because it doesn't really compress. The night garment is still too  big also. I have not been wrapped or in the sleeve for about 6 months. She also has a Flexi touch that she uses every once in a while.  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 Pts husband has wrapped before and she has a night garment  PAIN:  Are you having pain? No  PRECAUTIONS: right THR and diabetes, prior PE , significant OA bilateral shoulders, knees, left lumpectomy; no LN's RED FLAGS: None   WEIGHT BEARING RESTRICTIONS: No, but she uses a cane for knee OA  FALLS:  Has patient fallen in last 6 months? 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  COGNITION: Overall cognitive status: Within functional limits for tasks assessed   PALPATION: Significant fibrosis noted left forearm, mild pitting noted dorsum of hand and ulnar border of forearm  OBSERVATIONS / OTHER ASSESSMENTS: pt uses SPC, antalgic gait on left  SENSATION: Light touch:    POSTURE: forward head, rounded shoulders  UPPER EXTREMITY AROM/PROM:  A/PROM RIGHT   eval   Shoulder extension   Shoulder flexion 80(OA)  Shoulder abduction   Shoulder internal  rotation   Shoulder external rotation     (Blank rows = not tested)  A/PROM LEFT   eval  Shoulder extension   Shoulder flexion 60 (OA)  Shoulder abduction   Shoulder internal rotation   Shoulder external rotation     (Blank rows = not tested)  CERVICAL AROM: All within functional limits:     UPPER EXTREMITY STRENGTH:   LYMPHEDEMA ASSESSMENTS:   SURGERY TYPE/DATE:  1993: Right lumpectomy axillary dissection in [place]  (stage I) treated with radiation and tamoxifen  x 5 years 2002: Right mastectomy: Right breast recurrence 2004: Left mastectomy: 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 Developed lymphedema after this    2024 reoccurence LN's; had chemotherapy NUMBER OF LYMPH NODES REMOVED: ?  CHEMOTHERAPY: YES, last reoccurence  RADIATION:YES  HORMONE TREATMENT: YES,Neratinib /tamoxifen    INFECTIONS: NO   LYMPHEDEMA ASSESSMENTS:   LANDMARK RIGHT  eval  At axilla  30.3  15 cm proximal to olecranon process 28.3  10 cm proximal to olecranon process 26.7  Olecranon process 22.4  15 cm proximal to ulnar styloid process 20  10 cm proximal to ulnar styloid process 17.3  Just proximal to ulnar styloid process 15.1  Across hand at thumb web space 19.2  At base of 2nd digit 5.95  (Blank rows = not tested)  LANDMARK LEFT  eval Left 09/09/23 LEFT 09/18/2023 Left 10/07/23  At axilla  36.0 33.9 32.2 33.8  15 cm proximal to olecranon process 35.1 32.5 32.7 33  10 cm proximal to olecranon process 35.0 31.3 32.2 29.7  Olecranon process 30.4 27.6 26.7 26.4  15 cm proximal to ulnar styloid process 29.8 27.8 25.7 27.6  10 cm proximal to ulnar styloid process 26.8 25.7 23.2 25  Just proximal  to ulnar styloid process 23.4 20.4 19.7 20.7  Across hand at thumb web space 20.6 18.8 18.6 18.5  At base of 2nd digit 6.4 6 5.9 5.7  (Blank rows = not tested)   FUNCTIONAL TESTS:     GAIT: Distance walked: front desk to room 8 Assistive device utilized: Single point cane Level of assistance: Modified independence Comments: antalgic gait  LYMPHEDEMA LIFE IMPACT SCALE: 19                                                                                                                            TREATMENT DATE:  10/07/23: Pt had just removed bandages last night. Re measured circumference and compared to 2x ago measurements (last ones seemed to have gotten a bit out of order) she is reduced.  Manual Therapy MLD to Lt UE as follows: Short neck, superficial and deep abdominals, Lt inguinal nodes, Lt axillo-inguinal anastomosis, then focused on Lt UE working from proximal to distal then retracing all steps back to anastomosis spending extra time on fibrosis at posterior forearm. P/ROM gently to her Lt shoulder into flex and scaption, also gentle grade I-II mobs inferior and posterior, pt continues to report feeling great relief in shoulder tightness with this and looks forward to continuing stretching with her pulleys once they arrive Compression Bandaging to Lt UE: Cocoa butter, TG soft, Molelast to fingers 1-4, Artiflex x 1 from hand to axilla, then short stretch compression bandages: 1-6 cm to hand/wrist, 1-12 cm spiral with X at elbow, and 1-12 cm spiral from wrist to axilla.   10/02/2023 Pt arrived unwrapped. Wrist and hand area very swollen and ulnar border fibrotic MLD to Lt UE as follows: Short neck, superficial and deep abdominals, Lt inguinal nodes, Lt axillo-inguinal anastomosis, then focused on Lt UE working from proximal to distal then retracing all steps back to anastomosis. Compression Bandaging to Lt UE: Cocoa butter, TG soft, Molelast to fingers 1-4, Artiflex x 1 from hand to axilla, then short stretch compression bandages: 1-6 cm to hand/wrist, 1-12 cm spiral with X at elbow, and 1-12 cm spiral from wrist to axilla.  Discussed the night garment she has at  home and determined she was trying to wear the over sleeve under the garment. She will try at home with the sleeve on top to see if it fits better.  She would like to be released from PT and her husband may wrap her, however, we have not seen him wrap. Discussed he can wrap her before her next appt and we can check when she comes in. It would be helpful to have him attend her appt so we may instruct him prn. May benefit from continuing PT atleast 1x per week until she receives her new garments.  09/29/23: Manual Therapy Removed bandages and assessed skin, no redness noted, pt tolerating these well.  MLD to Lt UE as follows: Short neck, superficial and deep abdominals, Lt inguinal nodes, Lt axillo-inguinal anastomosis, then  focused on Lt UE working from proximal to distal then retracing all steps back to anastomosis. P/ROM gently to her Lt shoulder into flex and scaption, also gentle grade I-II mobs inferior and posterior, pt continued to report feeling great relief in shoulder tightness with this Compression Bandaging to Lt UE: Cocoa butter, TG soft, Molelast to fingers 1-4, Artiflex x 1 from hand to axilla, then short stretch compression bandages: 1-6 cm to hand/wrist, 1-12 cm spiral with X at elbow, and 1-12 cm spiral from wrist to axilla.          PATIENT EDUCATION:  Education details: LOS, POC, treatment interventions Person educated: Patient Education method: Explanation Education comprehension: verbalized understanding  HOME EXERCISE PROGRAM:   ASSESSMENT:  CLINICAL IMPRESSION: Insurance re auth sent last session. Pt reports will cont to come as we suggest until her new garments arrive so made her 1 appt for next week as we only had 1 available at this time and she was added to our wait list for Mon or Tues. Instructed her that if she tires of continuing to come 2x/wk until garments arrive she just needs to being her husband in during her session and we will review bandaging with  him so she can have more flexibility with her appts. Pt verbalized understanding and for now plans to cont 2x/wk. Continued with CDT to Lt UE and gentle P/ROM to Lt shoulder as she is reporting great pain relief with this.  10/07/2023 ADDENDUM:Pt requires Mediven 550 custom flat knit compression garment with elbow flexure zone for comfort  OBJECTIVE IMPAIRMENTS: decreased activity tolerance, decreased knowledge of condition, decreased mobility, decreased ROM, decreased strength, increased edema, impaired UE functional use, and postural dysfunction.   ACTIVITY LIMITATIONS: carrying, lifting, standing, stairs, reach over head, and hygiene/grooming  PARTICIPATION LIMITATIONS: cleaning, shopping, and community activity  PERSONAL FACTORS: 3+ comorbidities: Left Breast Cancer with re;occurrence, radiation, chemotherapy, Lymphedema are also affecting patient's functional outcome.   REHAB POTENTIAL: Good  CLINICAL DECISION MAKING: Stable/uncomplicated  EVALUATION COMPLEXITY: Low  GOALS: Goals reviewed with patient? Yes  SHORT TERM GOALS: Target date: 09/17/2023  Pts husband will be independent with compression bandaging Baseline: Goal status:In Progress  2.  Pt will have 2 cm reduction at 10 cm above ulna styloid process Baseline:  Goal status: MET 09/18/2023  3.  Pt will have 2 cm reduction at 10 cm prox to olecranon for improved fit of clothing Baseline:  Goal status: MET 09/18/2023 4.  Pt will be tolerant of compression bandaging Baseline:  Goal status: MET 09/18/2023  LONG TERM GOALS: Target date: 10/08/2023  Pt will have decreased edema at 10 cm prox to ulna styloid process by 3 cm or greater for better fit of clothing Baseline:  Goal status:MET 09/18/2023  2.  Pt will have decreased edema at 10 cm prox to olecranon by 3 cm or greater for improved clothig fit Baseline:  Goal status:MET  09/09/2023 3.  Pt will have appropriate day and night garments to control left UE  lymphedema Baseline:  Goal status: In Progress Measured but not yet received  4.  Pt/husband will be independent in management of lymphedema Baseline:  Goal status: In Progress   PLAN:  PT FREQUENCY: 1 x/week prn  PT DURATION: 8 weeks, or until she receives appropriate day ad night garments to be independent.  PLANNED INTERVENTIONS: 97164- PT Re-evaluation, 97110-Therapeutic exercises, 97530- Therapeutic activity, 97535- Self Care, 02859- Manual therapy, [id number]- Orthotic Initial, [id number]- Orthotic/Prosthetic subsequent, Patient/Family education, and Manual lymph drainage  PLAN FOR NEXT SESSION: Pt needs to make more appts if she is to cont Cont CDT until new garments arrive. How are pulleys going at home? Review this with her to assess for compensations. Instruct husband when he comes for 7/7 appt in compression bandaging as she may want to decr freq while waiting for garments.   Addendum: Pt was measured on 09/30/23 for custom daytime flat knit Mediven 550 Lt UE sleeve and glove x 2 and custom Circaid Profile nighttime garment x 1 that she will need x 30 years  Aden Berwyn Caldron, PTA 10/07/2023, 10:44 AM

## 2023-10-08 ENCOUNTER — Telehealth: Payer: Self-pay | Admitting: Pharmacy Technician

## 2023-10-08 ENCOUNTER — Other Ambulatory Visit: Payer: Self-pay

## 2023-10-08 NOTE — Telephone Encounter (Signed)
 Oral Oncology Patient [facility] Encounter  Received a call from [facility] - They need a new prescription for this pt's Nurlynx #[id number] sent to [facility]. Shelba Solomons, CPhT [facility] Phone: [phone] Fax: [phone]

## 2023-10-09 ENCOUNTER — Telehealth: Payer: Self-pay

## 2023-10-09 NOTE — Telephone Encounter (Signed)
 Verbally confirmed appt for 6/30

## 2023-10-12 ENCOUNTER — Inpatient Hospital Stay: Attending: Adult Health

## 2023-10-12 ENCOUNTER — Inpatient Hospital Stay: Admitting: Hematology and Oncology

## 2023-10-12 VITALS — BP 158/65 | HR 92 | Temp 98.0°F | Resp 19 | Wt 169.8 lb

## 2023-10-12 DIAGNOSIS — C50312 Malignant neoplasm of lower-inner quadrant of left female breast: Secondary | ICD-10-CM | POA: Diagnosis present

## 2023-10-12 DIAGNOSIS — I890 Lymphedema, not elsewhere classified: Secondary | ICD-10-CM

## 2023-10-12 DIAGNOSIS — G620 Drug-induced polyneuropathy: Secondary | ICD-10-CM

## 2023-10-12 DIAGNOSIS — K5900 Constipation, unspecified: Secondary | ICD-10-CM | POA: Diagnosis not present

## 2023-10-12 DIAGNOSIS — Z170 Estrogen receptor positive status [ER+]: Secondary | ICD-10-CM | POA: Diagnosis not present

## 2023-10-12 DIAGNOSIS — C50919 Malignant neoplasm of unspecified site of unspecified female breast: Secondary | ICD-10-CM

## 2023-10-12 DIAGNOSIS — T451X5A Adverse effect of antineoplastic and immunosuppressive drugs, initial encounter: Secondary | ICD-10-CM

## 2023-10-12 DIAGNOSIS — G629 Polyneuropathy, unspecified: Secondary | ICD-10-CM | POA: Insufficient documentation

## 2023-10-12 DIAGNOSIS — C773 Secondary and unspecified malignant neoplasm of axilla and upper limb lymph nodes: Secondary | ICD-10-CM | POA: Diagnosis present

## 2023-10-12 DIAGNOSIS — Z95828 Presence of other vascular implants and grafts: Secondary | ICD-10-CM

## 2023-10-12 DIAGNOSIS — C7989 Secondary malignant neoplasm of other specified sites: Secondary | ICD-10-CM

## 2023-10-12 DIAGNOSIS — Z79810 Long term (current) use of selective estrogen receptor modulators (SERMs): Secondary | ICD-10-CM | POA: Insufficient documentation

## 2023-10-12 LAB — CMP (CANCER CENTER ONLY)
ALT: 10 U/L (ref 0–44)
AST: 18 U/L (ref 15–41)
Albumin: 3.9 g/dL (ref 3.5–5.0)
Alkaline Phosphatase: 71 U/L (ref 38–126)
Anion gap: 6 (ref 5–15)
BUN: 14 mg/dL (ref 8–23)
CO2: 26 mmol/L (ref 22–32)
Calcium: 8.7 mg/dL — ABNORMAL LOW (ref 8.9–10.3)
Chloride: 107 mmol/L (ref 98–111)
Creatinine: 0.66 mg/dL (ref 0.44–1.00)
GFR, Estimated: >60 mL/min (ref >60)
Glucose, Bld: 134 mg/dL — ABNORMAL HIGH (ref 70–99)
Potassium: 3.8 mmol/L (ref 3.5–5.1)
Sodium: 139 mmol/L (ref 135–145)
Total Bilirubin: 0.6 mg/dL (ref 0.0–1.2)
Total Protein: 7 g/dL (ref 6.5–8.1)

## 2023-10-12 LAB — CBC WITH DIFFERENTIAL/PLATELET
Abs Immature Granulocytes: 0.01 10*3/uL (ref 0.00–0.07)
Basophils Absolute: 0 10*3/uL (ref 0.0–0.1)
Basophils Relative: 1 %
Eosinophils Absolute: 0.2 10*3/uL (ref 0.0–0.5)
Eosinophils Relative: 4 %
HCT: 36.7 % (ref 36.0–46.0)
Hemoglobin: 11.9 g/dL — ABNORMAL LOW (ref 12.0–15.0)
Immature Granulocytes: 0 %
Lymphocytes Relative: 33 %
Lymphs Abs: 1.7 10*3/uL (ref 0.7–4.0)
MCH: 25.4 pg — ABNORMAL LOW (ref 26.0–34.0)
MCHC: 32.4 g/dL (ref 30.0–36.0)
MCV: 78.4 fL — ABNORMAL LOW (ref 80.0–100.0)
Monocytes Absolute: 0.4 10*3/uL (ref 0.1–1.0)
Monocytes Relative: 7 %
Neutro Abs: 2.9 10*3/uL (ref 1.7–7.7)
Neutrophils Relative %: 55 %
Platelets: 179 10*3/uL (ref 150–400)
RBC: 4.68 MIL/uL (ref 3.87–5.11)
RDW: 16.3 % — ABNORMAL HIGH (ref 11.5–15.5)
WBC: 5.3 10*3/uL (ref 4.0–10.5)
nRBC: 0 % (ref 0.0–0.2)

## 2023-10-12 LAB — MAGNESIUM: Magnesium: 1.7 mg/dL (ref 1.7–2.4)

## 2023-10-12 MED ORDER — SODIUM CHLORIDE 0.9% FLUSH
10.0000 mL | Freq: Once | INTRAVENOUS | Status: AC
  Administered 2023-10-12: 10 mL

## 2023-10-12 MED ORDER — TAMOXIFEN CITRATE 20 MG PO TABS: 20.0000 mg | ORAL_TABLET | Freq: Every day | ORAL | 3 refills | Status: DC

## 2023-10-12 MED ORDER — GABAPENTIN 100 MG PO CAPS: 100.0000 mg | ORAL_CAPSULE | Freq: Every day | ORAL | 0 refills | Status: DC

## 2023-10-12 MED ORDER — HEPARIN SOD (PORK) LOCK FLUSH 100 UNIT/ML IV SOLN
500.0000 [IU] | Freq: Once | INTRAVENOUS | Status: AC
  Administered 2023-10-12: 500 [IU]

## 2023-10-12 MED ORDER — NERATINIB MALEATE 40 MG PO TABS: 120.0000 mg | ORAL_TABLET | Freq: Every day | ORAL | 0 refills | Status: DC

## 2023-10-12 NOTE — Assessment & Plan Note (Deleted)
 Assessment and Plan Assessment & Plan

## 2023-10-12 NOTE — Progress Notes (Signed)
 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) Staging form: Breast, AJCC 7th Edition - Clinical: Stage Unknown (TX, N1, M0) - Signed by Layla Sandria BROCKS, MD on 07/27/2014  Malignant neoplasm of lower-inner quadrant of left breast in female, estrogen receptor positive (HCC) Staging form: Breast, AJCC 7th Edition - Clinical: Stage IA (T1b, N0, M0) - Signed by Layla Sandria BROCKS, MD on 07/27/2014    SUMMARY OF ONCOLOGIC HISTORY: 1993: Right lumpectomy axillary dissection in [place]  (stage I) treated with radiation and tamoxifen  x 5 years 2002: Right mastectomy: Right breast recurrence 2004: Left mastectomy: 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  CURRENT THERAPY: Neratinib /tamoxifen   INTERVAL HISTORY:  DANESE Tami Schmidt 89 y.o. female returns for follow-up while on neratinib  and tamoxifen .  Discussed the use of AI scribe software for clinical note transcription with the patient, who gave verbal consent to proceed.  History of Present Illness Tami Schmidt is an 89 year old female with breast cancer who presents for follow-up regarding her current treatment and medication management.  She experiences fatigue but is able to perform daily activities at home. When tired, she rests and then resumes her activities. No new symptoms such as cough or shortness of breath are reported. Her breathing is okay, and her bowels  are moving well. No swelling is noted, and she feels she is in 'good shape'.  She is currently taking Miralax , which is effective for her constipation. She takes three tablets and denies any side effects other than fatigue. No diarrhea is reported, and she also uses an herbal remedy for constipation.  She is on tamoxifen  and requires a refill, which she receives through mail order from [place] [facility]. She also takes neratinib  (Nerlynx ), which she initially thought was from [place] but later confirmed she gets from [employer]. She has about a week of medication left and mentions having to pay for it, though she does not recall the exact amount.  Her neuropathy has not worsened, and she attends a lymphedema clinic, which she finds helpful. She notes 'a little wrinkles' in her hand and follows instructions to remove her compression garment every three to four days.    Patient Active Problem List   Diagnosis Date Noted   Acute lower GI bleeding 01/07/2023   Rectal bleeding 01/05/2023   Acute GI bleeding 10/04/2022   Port-A-Cath in place 06/12/2022   Loss of perception for taste 03/17/2022   Primary osteoarthritis involving multiple joints 03/17/2022   Genetic testing 07/24/2021   Microcytosis 04/29/2021   Hypokalemia 04/28/2021   Pulmonary embolism (HCC)    Agnosia 08/09/2020   Hardening of the aorta (main artery of the heart) (HCC) 08/09/2020   Morbid obesity (HCC) 08/09/2020   Neuropathy 08/09/2020   Primary osteoarthritis 08/09/2020   Sensorineural hearing loss (SNHL) of both ears 08/07/2020   Anosmia 06/05/2020   Tinnitus, bilateral 06/05/2020   GI bleed 07/13/2019   ABLA (acute blood loss anemia) 07/12/2019   Microcytic anemia 01/05/2018  Melena 01/04/2018   Lower GI bleed 01/04/2018   Diabetes mellitus type II, controlled, with no complications (HCC) 12/05/2017   Tenosynovitis of left wrist 12/02/2017   Infection of left wrist (HCC) 11/30/2017   Primary osteoarthritis of  right hip 01/05/2017   Osteoarthritis of right hip 01/03/2017   Lymphedema of upper extremity 01/17/2014   Osteopenia 01/17/2014   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   Dilated pore of [place] 08/02/2013   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   Diabetes mellitus    fasting 90-100   HTN (hypertension) 03/25/2013   Hx of radiation therapy    breasts hx   Hypercholesterolemia    Hypertension    Lymphedema of arm    left arm   Pulmonary embolism (HCC)    Type II or unspecified type diabetes mellitus without mention of complication, not stated as uncontrolled 03/25/2013    SURGICAL HISTORY: Past Surgical History:  Procedure Laterality Date   ABDOMINAL HYSTERECTOMY     ANKLE SURGERY Right 1995   APPENDECTOMY     BIOPSY  01/07/2018   Procedure: BIOPSY;  Surgeon: Saintclair Jasper, MD;  Location: [facility];  Service: Gastroenterology;;   BREAST SURGERY Bilateral    mastectomy   COLONOSCOPY WITH PROPOFOL  N/A 01/07/2018   Procedure: COLONOSCOPY WITH PROPOFOL ;  Surgeon: Saintclair Jasper, MD;  Location: [facility];  Service: Gastroenterology;  Laterality: N/A;   ESOPHAGOGASTRODUODENOSCOPY (EGD) WITH PROPOFOL  N/A 01/06/2018   Procedure: ESOPHAGOGASTRODUODENOSCOPY (EGD) WITH PROPOFOL ;  Surgeon:  Saintclair Jasper, MD;  Location: [facility];  Service: Gastroenterology;  Laterality: N/A;   HERNIA REPAIR  04-26-2010   IR IMAGING GUIDED PORT INSERTION  10/29/2021   MASS EXCISION Left 01/26/2013   Procedure: EXCISION LEFT CHEST WALL MASS AND LEFT ABDOMNAL WALL MASS;  Surgeon: Vicenta DELENA Poli, MD;  Location: [facility] OR;  Service: General;  Laterality: Left;   MASS EXCISION Left 09/20/2014   Procedure: EXCISION OF LEFT CHEST WALL MASS;  Surgeon: Vicenta Poli, MD;  Location: [facility];  Service: General;  Laterality: Left;   TEE WITHOUT CARDIOVERSION N/A 12/08/2017   Procedure: TRANSESOPHAGEAL ECHOCARDIOGRAM (TEE);  Surgeon: Pietro Redell RAMAN, MD;  Location: [facility];  Service: Cardiovascular;  Laterality: N/A;   TOTAL HIP ARTHROPLASTY Right 01/05/2017   TOTAL HIP ARTHROPLASTY Right 01/05/2017   Procedure: TOTAL HIP ARTHROPLASTY ANTERIOR APPROACH;  Surgeon: Liam Lerner, MD;  Location: [facility] OR;  Service: Orthopedics;  Laterality: Right;    SOCIAL HISTORY: Social History   Socioeconomic History   Marital status: Married    Spouse name: Not on file   Number of children: Not on file   Years of education: Not on file   Highest education level: Not on file  Occupational History   Occupation: retired Child psychotherapist  Tobacco Use   Smoking status: Former  Current packs/day: 0.00    Types: Cigarettes    Quit date: 04/14/1972    Years since quitting: 51.5   Smokeless tobacco: Never  Vaping Use   Vaping status: Never Used  Substance and Sexual Activity   Alcohol use: Not Currently    Comment: occasional   Drug use: Not Currently   Sexual activity: Yes    Birth control/protection: Surgical  Other Topics Concern   Not on file  Social History Narrative   Not on file   Social Drivers of Health   Financial Resource Strain: Not on file  Food Insecurity: No Food Insecurity (05/21/2023)   Hunger Vital Sign    Worried About Running Out of Food in the Last Year: Never true     Ran Out of Food in the Last Year: Never true  Transportation Needs: No Transportation Needs (05/21/2023)   PRAPARE - Administrator, Civil Service (Medical): No    Lack of Transportation (Non-Medical): No  Physical Activity: Not on file  Stress: Not on file  Social Connections: Socially Integrated (05/21/2023)   Social Connection and Isolation Panel    Frequency of Communication with Friends and Family: More than three times a week    Frequency of Social Gatherings with Friends and Family: More than three times a week    Attends Religious Services: More than 4 times per year    Active Member of [employer] or Organizations: Yes    Attends Engineer, structural: More than 4 times per year    Marital Status: Married  Catering manager Violence: Not At Risk (05/21/2023)   Humiliation, Afraid, Rape, and Kick questionnaire    Fear of Current or Ex-Partner: No    Emotionally Abused: No    Physically Abused: No    Sexually Abused: No    FAMILY HISTORY: Family History  Problem Relation Age of Onset   Breast cancer Cousin        maternal first cousin   Uterine cancer Cousin        maternal first cousin    Review of Systems  Constitutional:  Negative for appetite change, chills, fatigue, fever and unexpected weight change.  HENT:   Negative for hearing loss, lump/mass and trouble swallowing.   Eyes:  Negative for eye problems and icterus.  Respiratory:  Negative for chest tightness, cough and shortness of breath.   Cardiovascular:  Negative for chest pain, leg swelling and palpitations.  Gastrointestinal:  Positive for constipation. Negative for abdominal distention, abdominal pain, blood in stool, diarrhea, nausea, rectal pain and vomiting.  Endocrine: Negative for hot flashes.  Genitourinary:  Negative for difficulty urinating.   Musculoskeletal:  Positive for arthralgias. Negative for back pain and myalgias.  Skin:  Negative for itching and rash.  Neurological:  Negative for  dizziness, extremity weakness, headaches and numbness.  Hematological:  Negative for adenopathy. Does not bruise/bleed easily.  Psychiatric/Behavioral:  Negative for depression. The patient is not nervous/anxious.       PHYSICAL EXAMINATION    There were no vitals filed for this visit.   Physical Exam Constitutional:      General: She is not in acute distress.    Appearance: Normal appearance. She is not toxic-appearing.  HENT:     Head: Normocephalic and atraumatic.     Mouth/Throat:     Mouth: Mucous membranes are moist.     Pharynx: Oropharynx is clear. No oropharyngeal exudate or posterior oropharyngeal erythema.   Eyes:  General: No scleral icterus.   Cardiovascular:     Rate and Rhythm: Normal rate and regular rhythm.     Pulses: Normal pulses.     Heart sounds: Normal heart sounds.  Pulmonary:     Effort: Pulmonary effort is normal.     Breath sounds: Normal breath sounds.  Abdominal:     General: Abdomen is flat. Bowel sounds are normal. There is no distension.     Palpations: Abdomen is soft.     Tenderness: There is no abdominal tenderness.   Musculoskeletal:        General: Swelling (left arm lymphedema) present.     Cervical back: Neck supple.  Lymphadenopathy:     Cervical: No cervical adenopathy.   Skin:    General: Skin is warm and dry.     Findings: No rash.   Neurological:     General: No focal deficit present.     Mental Status: She is alert.   Psychiatric:        Mood and Affect: Mood normal.        Behavior: Behavior normal.    LABORATORY DATA:  CBC    Component Value Date/Time   WBC 5.4 08/24/2023 1409   RBC 4.67 08/24/2023 1409   HGB 12.0 08/24/2023 1409   HGB 9.9 (L) 06/08/2023 0943   HGB 10.5 (L) 01/20/2017 1057   HCT 36.8 08/24/2023 1409   HCT 25.8 (L) 01/05/2018 0513   HCT 32.8 (L) 01/20/2017 1057   PLT 175 08/24/2023 1409   PLT 189 06/08/2023 0943   PLT 368 01/20/2017 1057   MCV 78.8 (L) 08/24/2023 1409   MCV  77.8 (L) 01/20/2017 1057   MCH 25.7 (L) 08/24/2023 1409   MCHC 32.6 08/24/2023 1409   RDW 15.9 (H) 08/24/2023 1409   RDW 16.0 (H) 01/20/2017 1057   LYMPHSABS 1.8 08/24/2023 1409   LYMPHSABS 1.3 01/20/2017 1057   MONOABS 0.4 08/24/2023 1409   MONOABS 0.4 01/20/2017 1057   EOSABS 0.2 08/24/2023 1409   EOSABS 0.1 01/20/2017 1057   BASOSABS 0.0 08/24/2023 1409   BASOSABS 0.1 01/20/2017 1057    CMP     Component Value Date/Time   NA 139 08/24/2023 1409   NA 139 01/20/2017 1057   K 4.0 08/24/2023 1409   K 3.9 01/20/2017 1057   CL 105 08/24/2023 1409   CO2 28 08/24/2023 1409   CO2 25 01/20/2017 1057   GLUCOSE 111 (H) 08/24/2023 1409   GLUCOSE 135 01/20/2017 1057   BUN 15 08/24/2023 1409   BUN 15.6 01/20/2017 1057   CREATININE 0.59 08/24/2023 1409   CREATININE 0.7 01/20/2017 1057   CALCIUM 8.9 08/24/2023 1409   CALCIUM 9.3 01/20/2017 1057   PROT 7.1 08/24/2023 1409   PROT 7.1 01/20/2017 1057   ALBUMIN 4.0 08/24/2023 1409   ALBUMIN 3.4 (L) 01/20/2017 1057   AST 19 08/24/2023 1409   AST 10 01/20/2017 1057   ALT 11 08/24/2023 1409   ALT 14 01/20/2017 1057   ALKPHOS 70 08/24/2023 1409   ALKPHOS 69 01/20/2017 1057   BILITOT 0.4 08/24/2023 1409   BILITOT 0.43 01/20/2017 1057   GFRNONAA >60 08/24/2023 1409   GFRAA >60 07/14/2019 0538     ASSESSMENT and THERAPY PLAN:   Assessment and Plan Assessment & Plan Metastatic breast cancer No new lesions or growth. Recent scans show stable disease. Current treatment with low-dose Nerlynx  and tamoxifen  is well-tolerated. - Refill tamoxifen  and Nerlynx  prescriptions through [facility] and [facility], respectively. -  Continue current dosage of Nerlynx ; consider increasing if disease activity changes.  Fatigue Mild fatigue likely related to ongoing cancer treatment, not significantly impacting daily activities.  Neuropathy Neuropathy is well-managed with no worsening symptoms. - Prescribe low-dose gabapentin  at night for neuropathy  symptoms. Send prescription to [facility].  Lymphedema Lymphedema is improving with regular clinic visits and use of compression garments.  Constipation Constipation is effectively managed with Miralax  and an herbal supplement.  General Health Maintenance Ensure port is flushed at each visit. - Schedule follow-up appointment in one month. - Order hemoglobin A1c test for the next visit. - Advise to call if new symptoms arise.  All questions were answered. The patient knows to call the clinic with any problems, questions or concerns. We can certainly see the patient much sooner if necessary.  Total encounter time:40 minutes*in face-to-face visit time, chart review, lab review, care coordination, order entry, and documentation of the encounter time.  *Total Encounter Time as defined by the Centers for Medicare and Medicaid Services includes, in addition to the face-to-face time of a patient visit (documented in the note above) non-face-to-face time: obtaining and reviewing outside history, ordering and reviewing medications, tests or procedures, care coordination (communications with other health care professionals or caregivers) and documentation in the medical record.

## 2023-10-13 ENCOUNTER — Other Ambulatory Visit: Payer: Self-pay

## 2023-10-15 ENCOUNTER — Other Ambulatory Visit (HOSPITAL_COMMUNITY): Payer: Self-pay

## 2023-10-15 ENCOUNTER — Other Ambulatory Visit: Payer: Self-pay | Admitting: *Deleted

## 2023-10-15 ENCOUNTER — Telehealth: Payer: Self-pay | Admitting: *Deleted

## 2023-10-15 ENCOUNTER — Encounter: Payer: Self-pay | Admitting: Hematology and Oncology

## 2023-10-15 ENCOUNTER — Ambulatory Visit: Payer: Self-pay | Attending: Adult Health | Admitting: Physical Therapy

## 2023-10-15 ENCOUNTER — Encounter: Payer: Self-pay | Admitting: Physical Therapy

## 2023-10-15 DIAGNOSIS — I890 Lymphedema, not elsewhere classified: Secondary | ICD-10-CM | POA: Diagnosis present

## 2023-10-15 DIAGNOSIS — Z483 Aftercare following surgery for neoplasm: Secondary | ICD-10-CM | POA: Insufficient documentation

## 2023-10-15 MED ORDER — NERATINIB MALEATE 40 MG PO TABS: 120.0000 mg | ORAL_TABLET | Freq: Every day | ORAL | 0 refills | Status: DC

## 2023-10-15 NOTE — Therapy (Signed)
 OUTPATIENT PHYSICAL THERAPY  UPPER EXTREMITY ONCOLOGY TREATMENT  Patient Name: Tami Schmidt MRN: [id number] DOB:09-07-1932, 89 y.o., female Today's Date: 10/15/2023  END OF SESSION:  PT End of Session - 10/15/23 1606     Visit Number 12    Number of Visits 18    Date for PT Re-Evaluation 11/27/23    Authorization Time Period 4 vists from 10/08/23- 11/05/23    Authorization - Visit Number 12    Authorization - Number of Visits 16    PT Start Time 1605    PT Stop Time 1704    PT Time Calculation (min) 59 min    Activity Tolerance Patient tolerated treatment well    Behavior During Therapy WFL for tasks assessed/performed          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   Diabetes mellitus    fasting 90-100   HTN (hypertension) 03/25/2013   Hx of radiation therapy    breasts hx   Hypercholesterolemia    Hypertension    Lymphedema of arm    left arm   Pulmonary embolism (HCC)    Type II or unspecified type diabetes mellitus without mention of complication, not stated as uncontrolled 03/25/2013   Past Surgical History:  Procedure Laterality Date   ABDOMINAL HYSTERECTOMY     ANKLE SURGERY Right 1995   APPENDECTOMY     BIOPSY  01/07/2018   Procedure: BIOPSY;  Surgeon: Saintclair Jasper, MD;  Location: [facility];  Service: Gastroenterology;;   BREAST SURGERY Bilateral    mastectomy   COLONOSCOPY WITH PROPOFOL  N/A 01/07/2018   Procedure: COLONOSCOPY WITH PROPOFOL ;  Surgeon: Saintclair Jasper, MD;  Location: [facility];  Service: Gastroenterology;  Laterality: N/A;   ESOPHAGOGASTRODUODENOSCOPY (EGD) WITH PROPOFOL  N/A 01/06/2018   Procedure: ESOPHAGOGASTRODUODENOSCOPY (EGD) WITH PROPOFOL ;  Surgeon: Saintclair Jasper, MD;  Location: [facility];  Service: Gastroenterology;  Laterality: N/A;   HERNIA REPAIR  04-26-2010   IR IMAGING GUIDED PORT  INSERTION  10/29/2021   MASS EXCISION Left 01/26/2013   Procedure: EXCISION LEFT CHEST WALL MASS AND LEFT ABDOMNAL WALL MASS;  Surgeon: Vicenta DELENA Poli, MD;  Location: [facility];  Service: General;  Laterality: Left;   MASS EXCISION Left 09/20/2014   Procedure: EXCISION OF LEFT CHEST WALL MASS;  Surgeon: Vicenta Poli, MD;  Location: [facility];  Service: General;  Laterality: Left;   TEE WITHOUT CARDIOVERSION N/A 12/08/2017   Procedure: TRANSESOPHAGEAL ECHOCARDIOGRAM (TEE);  Surgeon: Pietro Redell RAMAN, MD;  Location: [facility];  Service: Cardiovascular;  Laterality: N/A;   TOTAL HIP ARTHROPLASTY Right 01/05/2017   TOTAL HIP ARTHROPLASTY Right 01/05/2017   Procedure: TOTAL HIP ARTHROPLASTY ANTERIOR APPROACH;  Surgeon: Liam Lerner, MD;  Location: [facility];  Service: Orthopedics;  Laterality: Right;   Patient Active Problem List   Diagnosis Date Noted   Acute lower GI bleeding 01/07/2023   Rectal bleeding 01/05/2023   Acute GI bleeding 10/04/2022   Port-A-Cath in place 06/12/2022   Loss of perception for taste 03/17/2022   Primary osteoarthritis involving multiple joints 03/17/2022   Genetic testing 07/24/2021   Microcytosis 04/29/2021   Hypokalemia 04/28/2021   Pulmonary embolism (HCC)    Agnosia 08/09/2020   Hardening of the aorta (main artery of the heart) (HCC) 08/09/2020   Morbid obesity (HCC) 08/09/2020   Neuropathy  08/09/2020   Primary osteoarthritis 08/09/2020   Sensorineural hearing loss (SNHL) of both ears 08/07/2020   Anosmia 06/05/2020   Tinnitus, bilateral 06/05/2020   GI bleed 07/13/2019   ABLA (acute blood loss anemia) 07/12/2019   Microcytic anemia 01/05/2018   Melena 01/04/2018   Lower GI bleed 01/04/2018   Diabetes mellitus type II, controlled, with no complications (HCC) 12/05/2017   Tenosynovitis of left wrist 12/02/2017   Infection of left wrist (HCC) 11/30/2017   Primary osteoarthritis of right hip 01/05/2017   Osteoarthritis of right hip  01/03/2017   Lymphedema of upper extremity 01/17/2014   Osteopenia 01/17/2014   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   Dilated pore of Winer 08/02/2013   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 08/02/2013   Scar 08/02/2013   Malignant neoplasm of lower-inner quadrant of left breast in female, estrogen receptor positive (HCC) 06/16/2013   Type II or unspecified type diabetes mellitus without mention of complication, not stated as uncontrolled 03/25/2013   Essential hypertension 03/25/2013   Chest wall recurrence of breast cancer (HCC) 02/21/2013   Abdominal wall mass 01/14/2013    PCP:   REFERRING PROVIDER: Morna Kendall, NP  REFERRING DIAG: Lymphedema s/p breast cancer  THERAPY DIAG:  Lymphedema, not elsewhere classified  Aftercare following surgery for neoplasm  ONSET DATE: 2014 with exacerbation  Rationale for Evaluation and Treatment: Rehabilitation  SUBJECTIVE:                                                                                                                                                                                           SUBJECTIVE STATEMENT:  The sleeve that they sent me was not the sleeve I normally have. The hand piece was a guantlet and not a glove and the sleeve was not the kind I wanted.   EVAL The lymphedema in my arm seems to be worse and the NP wanted me to come see you. She doesn't wear her sleeve much because it doesn't really compress. The night garment is still too  big also. I have not been wrapped or in the sleeve for about 6 months. She also has a Flexi touch that she uses every once in a while.  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 Pts husband has wrapped before and she has a night garment  PAIN:  Are you having pain? No  PRECAUTIONS: right THR and diabetes, prior PE , significant OA bilateral shoulders, knees, left lumpectomy; no LN's RED FLAGS: None   WEIGHT BEARING RESTRICTIONS: No, but she uses a cane for knee OA  FALLS:  Has patient fallen in last 6 months? 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  COGNITION: Overall cognitive status: Within functional limits for tasks assessed   PALPATION: Significant fibrosis noted left forearm, mild pitting noted dorsum of hand and ulnar border of forearm  OBSERVATIONS / OTHER ASSESSMENTS: pt uses SPC, antalgic gait on left  SENSATION: Light touch:    POSTURE: forward head, rounded shoulders  UPPER EXTREMITY AROM/PROM:  A/PROM RIGHT   eval   Shoulder extension   Shoulder flexion 80(OA)  Shoulder abduction   Shoulder internal rotation   Shoulder external rotation     (Blank rows = not tested)  A/PROM LEFT   eval  Shoulder extension   Shoulder flexion 60  (OA)  Shoulder abduction   Shoulder internal rotation   Shoulder external rotation     (Blank rows = not tested)  CERVICAL AROM: All within functional limits:     UPPER EXTREMITY STRENGTH:   LYMPHEDEMA ASSESSMENTS:   SURGERY TYPE/DATE:  1993: Right lumpectomy axillary dissection in [place]  (stage I) treated with radiation and tamoxifen  x 5 years 2002: Right mastectomy: Right breast recurrence 2004: Left mastectomy: 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 Developed lymphedema after this    2024 reoccurence LN's; had chemotherapy NUMBER OF LYMPH NODES REMOVED: ?  CHEMOTHERAPY: YES, last reoccurence  RADIATION:YES  HORMONE TREATMENT: YES,Neratinib /tamoxifen    INFECTIONS: NO   LYMPHEDEMA ASSESSMENTS:   LANDMARK RIGHT  eval  At axilla  30.3  15 cm proximal to olecranon process 28.3  10 cm proximal to olecranon process 26.7  Olecranon process 22.4  15 cm proximal to ulnar styloid process 20  10 cm proximal to ulnar styloid process 17.3  Just proximal to ulnar styloid process 15.1  Across hand at thumb web space 19.2  At base of 2nd digit 5.95  (Blank rows = not tested)  LANDMARK LEFT  eval Left 09/09/23 LEFT 09/18/2023 Left 10/07/23  At axilla  36.0 33.9 32.2 33.8  15 cm proximal to olecranon process 35.1 32.5 32.7 33  10 cm proximal to olecranon process 35.0 31.3 32.2 29.7  Olecranon process 30.4 27.6 26.7 26.4  15 cm proximal to ulnar styloid process 29.8 27.8 25.7 27.6  10 cm proximal to ulnar styloid process 26.8 25.7 23.2 25  Just proximal to ulnar styloid process 23.4 20.4 19.7 20.7  Across hand at thumb web space 20.6 18.8 18.6 18.5  At base of 2nd digit  6.4 6 5.9 5.7  (Blank rows = not tested)   FUNCTIONAL TESTS:    GAIT: Distance walked: front desk to room 8 Assistive device utilized: Single point cane Level of assistance: Modified  independence Comments: antalgic gait  LYMPHEDEMA LIFE IMPACT SCALE: 19                                                                                                                            TREATMENT DATE:  10/15/23: Manual Therapy MLD to Lt UE as follows: Short neck, superficial and deep abdominals, Lt inguinal nodes, Lt axillo-inguinal anastomosis, then focused on Lt UE working from proximal to distal then retracing all steps back to anastomosis spending extra time on fibrosis at posterior forearm. P/ROM gently to her Lt shoulder into flex and scaption, gentle STM to L upper/posterior shoulder Compression Bandaging to Lt UE: Cocoa butter, TG soft, Molelast to fingers 1-4, Artiflex x 1 from hand to axilla, then short stretch compression bandages: 1-6 cm to hand/wrist, 1-12 cm spiral with X at elbow, and 1-12 cm spiral from wrist to axilla.   10/07/23: Pt had just removed bandages last night. Re measured circumference and compared to 2x ago measurements (last ones seemed to have gotten a bit out of order) she is reduced.  Manual Therapy MLD to Lt UE as follows: Short neck, superficial and deep abdominals, Lt inguinal nodes, Lt axillo-inguinal anastomosis, then focused on Lt UE working from proximal to distal then retracing all steps back to anastomosis spending extra time on fibrosis at posterior forearm. P/ROM gently to her Lt shoulder into flex and scaption, also gentle grade I-II mobs inferior and posterior, pt continues to report feeling great relief in shoulder tightness with this and looks forward to continuing stretching with her pulleys once they arrive Compression Bandaging to Lt UE: Cocoa butter, TG soft, Molelast to fingers 1-4, Artiflex x 1 from hand to axilla, then short stretch compression bandages: 1-6 cm to hand/wrist, 1-12 cm spiral with X at elbow, and 1-12 cm spiral from wrist to axilla.   10/02/2023 Pt arrived unwrapped. Wrist and hand area very swollen and ulnar border  fibrotic MLD to Lt UE as follows: Short neck, superficial and deep abdominals, Lt inguinal nodes, Lt axillo-inguinal anastomosis, then focused on Lt UE working from proximal to distal then retracing all steps back to anastomosis. Compression Bandaging to Lt UE: Cocoa butter, TG soft, Molelast to fingers 1-4, Artiflex x 1 from hand to axilla, then short stretch compression bandages: 1-6 cm to hand/wrist, 1-12 cm spiral with X at elbow, and 1-12 cm spiral from wrist to axilla.  Discussed the night garment she has at home and determined she was trying to wear the over sleeve under the garment. She will try at home with the sleeve on top to see if it fits better.  She would like to be released from PT and her husband may wrap her, however, we have not seen him wrap. Discussed  he can wrap her before her next appt and we can check when she comes in. It would be helpful to have him attend her appt so we may instruct him prn. May benefit from continuing PT atleast 1x per week until she receives her new garments.  09/29/23: Manual Therapy Removed bandages and assessed skin, no redness noted, pt tolerating these well.  MLD to Lt UE as follows: Short neck, superficial and deep abdominals, Lt inguinal nodes, Lt axillo-inguinal anastomosis, then focused on Lt UE working from proximal to distal then retracing all steps back to anastomosis. P/ROM gently to her Lt shoulder into flex and scaption, also gentle grade I-II mobs inferior and posterior, pt continued to report feeling great relief in shoulder tightness with this Compression Bandaging to Lt UE: Cocoa butter, TG soft, Molelast to fingers 1-4, Artiflex x 1 from hand to axilla, then short stretch compression bandages: 1-6 cm to hand/wrist, 1-12 cm spiral with X at elbow, and 1-12 cm spiral from wrist to axilla.          PATIENT EDUCATION:  Education details: LOS, POC, treatment interventions Person educated: Patient Education method:  Explanation Education comprehension: verbalized understanding  HOME EXERCISE PROGRAM:   ASSESSMENT:  CLINICAL IMPRESSION: Pt reports she received the wrong sleeve and a gauntlet instead of a glove. Will email DME supplier and pt plans to call. Continued with MLD and compression bandaging today. Pt reports great relief from shoulder pain and improved motion since starting therapy.   OBJECTIVE IMPAIRMENTS: decreased activity tolerance, decreased knowledge of condition, decreased mobility, decreased ROM, decreased strength, increased edema, impaired UE functional use, and postural dysfunction.   ACTIVITY LIMITATIONS: carrying, lifting, standing, stairs, reach over head, and hygiene/grooming  PARTICIPATION LIMITATIONS: cleaning, shopping, and community activity  PERSONAL FACTORS: 3+ comorbidities: Left Breast Cancer with re;occurrence, radiation, chemotherapy, Lymphedema are also affecting patient's functional outcome.   REHAB POTENTIAL: Good  CLINICAL DECISION MAKING: Stable/uncomplicated  EVALUATION COMPLEXITY: Low  GOALS: Goals reviewed with patient? Yes  SHORT TERM GOALS: Target date: 09/17/2023  Pts husband will be independent with compression bandaging Baseline: Goal status:In Progress  2.  Pt will have 2 cm reduction at 10 cm above ulna styloid process Baseline:  Goal status: MET 09/18/2023  3.  Pt will have 2 cm reduction at 10 cm prox to olecranon for improved fit of clothing Baseline:  Goal status: MET 09/18/2023 4.  Pt will be tolerant of compression bandaging Baseline:  Goal status: MET 09/18/2023  LONG TERM GOALS: Target date: 10/08/2023  Pt will have decreased edema at 10 cm prox to ulna styloid process by 3 cm or greater for better fit of clothing Baseline:  Goal status:MET 09/18/2023  2.  Pt will have decreased edema at 10 cm prox to olecranon by 3 cm or greater for improved clothig fit Baseline:  Goal status:MET  09/09/2023 3.  Pt will have appropriate day and  night garments to control left UE lymphedema Baseline:  Goal status: In Progress Measured but not yet received  4.  Pt/husband will be independent in management of lymphedema Baseline:  Goal status: In Progress   PLAN:  PT FREQUENCY: 1 x/week prn  PT DURATION: 8 weeks, or until she receives appropriate day ad night garments to be independent.  PLANNED INTERVENTIONS: 97164- PT Re-evaluation, 97110-Therapeutic exercises, 97530- Therapeutic activity, 97535- Self Care, [id number]- Manual therapy, [id number]- Orthotic Initial, [phone]- Orthotic/Prosthetic subsequent, Patient/Family education, and Manual lymph drainage  PLAN FOR NEXT SESSION: Cont CDT until new garments arrive.  How are pulleys going at home? Review this with her to assess for compensations. Instruct husband when he comes for 7/7 appt in compression bandaging as she may want to decr freq while waiting for garments.   Addendum: Pt was measured on 09/30/23 for custom daytime flat knit Mediven 550 Lt UE sleeve and glove x 2 and custom Circaid Profile nighttime garment x 1 that she will need x 30 years  [employer], PT 10/15/2023, 5:06 PM

## 2023-10-15 NOTE — Telephone Encounter (Signed)
 Per VM pt states she needs refill of her Nerlynx  which she obtains from [place] at [facility].  Per MD review- refill obtained and sent to above pharmacy.

## 2023-10-19 ENCOUNTER — Ambulatory Visit: Payer: Self-pay

## 2023-10-19 ENCOUNTER — Other Ambulatory Visit: Payer: Self-pay

## 2023-10-19 ENCOUNTER — Ambulatory Visit: Admitting: Family Medicine

## 2023-10-19 VITALS — BP 168/74 | HR 69 | Ht 67.0 in | Wt 171.0 lb

## 2023-10-19 DIAGNOSIS — G8929 Other chronic pain: Secondary | ICD-10-CM | POA: Diagnosis not present

## 2023-10-19 DIAGNOSIS — M25562 Pain in left knee: Secondary | ICD-10-CM

## 2023-10-19 DIAGNOSIS — Z483 Aftercare following surgery for neoplasm: Secondary | ICD-10-CM

## 2023-10-19 DIAGNOSIS — I890 Lymphedema, not elsewhere classified: Secondary | ICD-10-CM | POA: Diagnosis not present

## 2023-10-19 DIAGNOSIS — M25511 Pain in right shoulder: Secondary | ICD-10-CM | POA: Diagnosis not present

## 2023-10-19 DIAGNOSIS — R2689 Other abnormalities of gait and mobility: Secondary | ICD-10-CM

## 2023-10-19 DIAGNOSIS — M25512 Pain in left shoulder: Secondary | ICD-10-CM

## 2023-10-19 NOTE — Patient Instructions (Addendum)
 Thank you for coming in today.   You received an injection today. Seek immediate medical attention if the joint becomes red, extremely painful, or is oozing fluid.   I can repeat these injections in 3 months, if needed  I've referred you to Physical Therapy.  Let us  know if you don't hear from them in one week.   Check back anytime after September 15th.

## 2023-10-19 NOTE — Progress Notes (Signed)
 LILLETTE Ileana Collet, PhD, LAT, ATC acting as a scribe for Artist Lloyd, MD.  Tami Schmidt is a 89 y.o. female who presents to [facility] today for exacerbation of her bilat shoulder and L knee pain. Pt was last seen by Dr. Lloyd on 09/01/23 and was referred to PT  Today, pt reports both shoulders are still painful. L knee pain also continues. She is ready to consider a steroid injection. She also notes she has lost some length in her R leg and relates this to her prior hip surgery.   Dx imaging: 09/01/23 L knee and R & L shoulder XR  Pertinent review of systems: No fevers or chills  Relevant historical information: History of a pulmonary embolism.  Diabetes.  Breast cancer status postchemotherapy.   Exam:  BP (!) 168/74   Pulse 69   Ht 5' 7 (1.702 m)   Wt 171 lb (77.6 kg)   SpO2 98%   BMI 26.78 kg/m  General: Well Developed, well nourished, and in no acute distress.   MSK: Left shoulder decreased range of motion. Normal-appearing Strength intact within limits of motion.  Left knee mild effusion normal appearing otherwise. Normal motion.    Lab and Radiology Results  Procedure: Real-time Ultrasound Guided Injection of left knee joint superior lateral patella space Device: Philips Affiniti 50G/GE Logiq Images permanently stored and available for review in PACS Verbal informed consent obtained.  Discussed risks and benefits of procedure. Warned about infection, bleeding, hyperglycemia damage to structures among others. Patient expresses understanding and agreement Time-out conducted.   Noted no overlying erythema, induration, or other signs of local infection.   Skin prepped in a sterile fashion.   Local anesthesia: Topical Ethyl chloride.   With sterile technique and under real time ultrasound guidance: 40 mg of Kenalog and 2 mL of Marcaine  injected into knee joint. Fluid seen entering the joint capsule.   Completed without difficulty    Pain immediately resolved suggesting accurate placement of the medication.   Advised to call if fevers/chills, erythema, induration, drainage, or persistent bleeding.   Images permanently stored and available for review in the ultrasound unit.  Impression: Technically successful ultrasound guided injection.   Procedure: Real-time Ultrasound Guided Injection of left shoulder glenohumeral joint posterior approach Device: Philips Affiniti 50G/GE Logiq Images permanently stored and available for review in PACS Verbal informed consent obtained.  Discussed risks and benefits of procedure. Warned about infection, bleeding, hyperglycemia damage to structures among others. Patient expresses understanding and agreement Time-out conducted.   Noted no overlying erythema, induration, or other signs of local infection.   Skin prepped in a sterile fashion.   Local anesthesia: Topical Ethyl chloride.   With sterile technique and under real time ultrasound guidance: 40 mg of Kenalog and 2 mL's of Marcaine  injected into glenohumeral joint. Fluid seen entering the joint capsule.   Completed without difficulty   Pain immediately resolved suggesting accurate placement of the medication.   Advised to call if fevers/chills, erythema, induration, drainage, or persistent bleeding.   Images permanently stored and available for review in the ultrasound unit.  Impression: Technically successful ultrasound guided injection.         Assessment and Plan: 89 y.o. female with left knee and left shoulder pain due to DJD.  She does have some remaining right shoulder pain as well.  Plan for left shoulder and left knee injection.  Additionally will refer to physical therapy.  Consider right shoulder injection in  the future if needed.  Check back as needed.   PDMP not reviewed this encounter. Orders Placed This Encounter  Procedures   US  LIMITED JOINT SPACE STRUCTURES UP BILAT(NO LINKED CHARGES)    Reason for  Exam (SYMPTOM  OR DIAGNOSIS REQUIRED):   bilateral shoulder pain    Preferred imaging location?:   [facility] referral to Physical Therapy    Referral Priority:   Routine    Referral Type:   Physical Medicine    Referral Reason:   Specialty Services Required    Requested Specialty:   Physical Therapy    Number of Visits Requested:   1   No orders of the defined types were placed in this encounter.    Discussed warning signs or symptoms. Please see discharge instructions. Patient expresses understanding.   The above documentation has been reviewed and is accurate and complete Artist Lloyd, M.D.

## 2023-10-19 NOTE — Therapy (Signed)
 OUTPATIENT PHYSICAL THERAPY  UPPER EXTREMITY ONCOLOGY TREATMENT  Patient Name: Tami Schmidt MRN: [id number] DOB:1932-10-26, 89 y.o., female Today's Date: 10/19/2023  END OF SESSION:  PT End of Session - 10/19/23 1132     Visit Number 13    Number of Visits 18    Date for PT Re-Evaluation 11/27/23    Authorization Type [employer]    Authorization Time Period 4 visits 10/08/2023 - 11/05/2023    Authorization - Visit Number 13    Authorization - Number of Visits 16    PT Start Time 1107    PT Stop Time 1208    PT Time Calculation (min) 61 min    Activity Tolerance Patient tolerated treatment well    Behavior During Therapy WFL for tasks assessed/performed          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   Diabetes mellitus    fasting 90-100   HTN (hypertension) 03/25/2013   Hx of radiation therapy    breasts hx   Hypercholesterolemia    Hypertension    Lymphedema of arm    left arm   Pulmonary embolism (HCC)    Type II or unspecified type diabetes mellitus without mention of complication, not stated as uncontrolled 03/25/2013   Past Surgical History:  Procedure Laterality Date   ABDOMINAL HYSTERECTOMY     ANKLE SURGERY Right 1995   APPENDECTOMY     BIOPSY  01/07/2018   Procedure: BIOPSY;  Surgeon: Saintclair Jasper, MD;  Location: [facility];  Service: Gastroenterology;;   BREAST SURGERY Bilateral    mastectomy   COLONOSCOPY WITH PROPOFOL  N/A 01/07/2018   Procedure: COLONOSCOPY WITH PROPOFOL ;  Surgeon: Saintclair Jasper, MD;  Location: [facility];  Service: Gastroenterology;  Laterality: N/A;   ESOPHAGOGASTRODUODENOSCOPY (EGD) WITH PROPOFOL  N/A 01/06/2018   Procedure: ESOPHAGOGASTRODUODENOSCOPY (EGD) WITH PROPOFOL ;  Surgeon: Saintclair Jasper, MD;  Location: [facility];  Service: Gastroenterology;  Laterality: N/A;   HERNIA REPAIR  04-26-2010   IR  IMAGING GUIDED PORT INSERTION  10/29/2021   MASS EXCISION Left 01/26/2013   Procedure: EXCISION LEFT CHEST WALL MASS AND LEFT ABDOMNAL WALL MASS;  Surgeon: Vicenta DELENA Poli, MD;  Location: [facility];  Service: General;  Laterality: Left;   MASS EXCISION Left 09/20/2014   Procedure: EXCISION OF LEFT CHEST WALL MASS;  Surgeon: Vicenta Poli, MD;  Location: [facility];  Service: General;  Laterality: Left;   TEE WITHOUT CARDIOVERSION N/A 12/08/2017   Procedure: TRANSESOPHAGEAL ECHOCARDIOGRAM (TEE);  Surgeon: Pietro Redell RAMAN, MD;  Location: [facility];  Service: Cardiovascular;  Laterality: N/A;   TOTAL HIP ARTHROPLASTY Right 01/05/2017   TOTAL HIP ARTHROPLASTY Right 01/05/2017   Procedure: TOTAL HIP ARTHROPLASTY ANTERIOR APPROACH;  Surgeon: Liam Lerner, MD;  Location: [facility];  Service: Orthopedics;  Laterality: Right;   Patient Active Problem List   Diagnosis Date Noted   Acute lower GI bleeding 01/07/2023   Rectal bleeding 01/05/2023   Acute GI bleeding 10/04/2022   Port-A-Cath in place 06/12/2022   Loss of perception for taste 03/17/2022   Primary osteoarthritis involving multiple joints 03/17/2022   Genetic testing 07/24/2021   Microcytosis 04/29/2021   Hypokalemia 04/28/2021   Pulmonary embolism (HCC)    Agnosia 08/09/2020   Hardening of the aorta (main artery of the heart) (HCC) 08/09/2020   Morbid  obesity (HCC) 08/09/2020   Neuropathy 08/09/2020   Primary osteoarthritis 08/09/2020   Sensorineural hearing loss (SNHL) of both ears 08/07/2020   Anosmia 06/05/2020   Tinnitus, bilateral 06/05/2020   GI bleed 07/13/2019   ABLA (acute blood loss anemia) 07/12/2019   Microcytic anemia 01/05/2018   Melena 01/04/2018   Lower GI bleed 01/04/2018   Diabetes mellitus type II, controlled, with no complications (HCC) 12/05/2017   Tenosynovitis of left wrist 12/02/2017   Infection of left wrist (HCC) 11/30/2017   Primary osteoarthritis of right hip 01/05/2017    Osteoarthritis of right hip 01/03/2017   Lymphedema of upper extremity 01/17/2014   Osteopenia 01/17/2014   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   Dilated pore of Winer 08/02/2013   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 08/02/2013   Scar 08/02/2013   Malignant neoplasm of lower-inner quadrant of left breast in female, estrogen receptor positive (HCC) 06/16/2013   Type II or unspecified type diabetes mellitus without mention of complication, not stated as uncontrolled 03/25/2013   Essential hypertension 03/25/2013   Chest wall recurrence of breast cancer (HCC) 02/21/2013   Abdominal wall mass 01/14/2013    PCP:   REFERRING PROVIDER: Morna Kendall, NP  REFERRING DIAG: Lymphedema s/p breast cancer  THERAPY DIAG:  Lymphedema, not elsewhere classified  Aftercare following surgery for neoplasm  ONSET DATE: 2014 with exacerbation  Rationale for Evaluation and Treatment: Rehabilitation  SUBJECTIVE:                                                                                                                                                                                           SUBJECTIVE STATEMENT:  The sleeve that they sent me was not the sleeve I normally have. The hand piece was a guantlet and not a glove and the sleeve was not the kind I wanted.   EVAL The lymphedema in my arm seems to be worse and the NP wanted me to come see you. She doesn't wear her sleeve much because it doesn't really compress. The night garment is still too  big also. I have not been wrapped or in the sleeve for about 6 months. She also has a Flexi touch that she uses every once in a while.  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 Pts husband has wrapped before and she has a night garment  PAIN:  Are you having pain? No  PRECAUTIONS: right THR and diabetes, prior PE , significant OA bilateral shoulders, knees, left lumpectomy; no LN's RED FLAGS: None   WEIGHT BEARING RESTRICTIONS: No, but she uses a cane for knee OA  FALLS:  Has patient fallen in last 6 months? 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  COGNITION: Overall cognitive status: Within functional limits for tasks assessed   PALPATION: Significant fibrosis noted left forearm, mild pitting noted dorsum of hand and ulnar border of forearm  OBSERVATIONS / OTHER ASSESSMENTS: pt uses SPC, antalgic gait on left  SENSATION: Light touch:    POSTURE: forward head, rounded shoulders  UPPER EXTREMITY AROM/PROM:  A/PROM RIGHT   eval   Shoulder extension   Shoulder flexion 80(OA)  Shoulder abduction   Shoulder internal rotation   Shoulder external rotation     (Blank rows = not tested)  A/PROM LEFT   eval  Shoulder  extension   Shoulder flexion 60 (OA)  Shoulder abduction   Shoulder internal rotation   Shoulder external rotation     (Blank rows = not tested)  CERVICAL AROM: All within functional limits:     UPPER EXTREMITY STRENGTH:   LYMPHEDEMA ASSESSMENTS:   SURGERY TYPE/DATE:  1993: Right lumpectomy axillary dissection in [place]  (stage I) treated with radiation and tamoxifen  x 5 years 2002: Right mastectomy: Right breast recurrence 2004: Left mastectomy: 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 Developed lymphedema after this    2024 reoccurence LN's; had chemotherapy NUMBER OF LYMPH NODES REMOVED: ?  CHEMOTHERAPY: YES, last reoccurence  RADIATION:YES  HORMONE TREATMENT: YES,Neratinib /tamoxifen    INFECTIONS: NO   LYMPHEDEMA ASSESSMENTS:   LANDMARK RIGHT  eval  At axilla  30.3  15 cm proximal to olecranon process 28.3  10 cm proximal to olecranon process 26.7  Olecranon process 22.4  15 cm proximal to ulnar styloid process 20  10 cm proximal to ulnar styloid process 17.3  Just proximal to ulnar styloid process 15.1  Across hand at thumb web space 19.2  At base of 2nd digit 5.95  (Blank rows = not tested)  LANDMARK LEFT  eval Left 09/09/23 LEFT 09/18/2023 Left 10/07/23  At axilla  36.0 33.9 32.2 33.8  15 cm proximal to olecranon process 35.1 32.5 32.7 33  10 cm proximal to olecranon process 35.0 31.3 32.2 29.7  Olecranon process 30.4 27.6 26.7 26.4  15 cm proximal to ulnar styloid process 29.8 27.8 25.7 27.6  10 cm proximal to ulnar styloid process 26.8 25.7 23.2 25  Just proximal to ulnar styloid process 23.4 20.4 19.7 20.7  Across hand at thumb web space 20.6 18.8 18.6 18.5  At base of 2nd digit 6.4 6 5.9 5.7  (Blank rows = not tested)   FUNCTIONAL TESTS:    GAIT: Distance walked: front desk to room 8 Assistive device utilized: Single point cane Level of  assistance: Modified independence Comments: antalgic gait  LYMPHEDEMA LIFE IMPACT SCALE: 19                                                                                                                            TREATMENT DATE:  10/19/23: Self Care Was speaking with pt about garment and then on the phone off and on with [facility] [employer] trying to figure out why pt received wrong garment. They found out the copay she made was for the custom garments but they ordered the wrong sleeve and glove. So this is being reordered today and will try to expedite on their end.  Manual Therapy MLD to Lt UE as follows: Short neck, superficial and deep abdominals, Lt inguinal nodes, Lt axillo-inguinal anastomosis, then focused on Lt UE working from proximal to distal then retracing all steps back to anastomosis spending extra time on fibrosis at posterior forearm. P/ROM gently to her Lt shoulder into flex and scaption with scapular depression by therapist throughout Compression Bandaging to Lt UE: Cocoa butter, TG soft, Molelast to fingers 1-4, Artiflex x 1 from hand to axilla, then short stretch compression bandages: 1-6 cm to hand/wrist, 1-12 cm spiral with X at elbow, and 1-12 cm spiral from wrist to axilla.   10/15/23: Manual Therapy MLD to Lt UE as follows: Short neck, superficial and deep abdominals, Lt inguinal nodes, Lt axillo-inguinal anastomosis, then focused on Lt UE working from proximal to distal then retracing all steps back to anastomosis spending extra time on fibrosis at posterior forearm. P/ROM gently to her Lt shoulder into flex and scaption, gentle STM to L upper/posterior shoulder Compression Bandaging to Lt UE: Cocoa butter, TG soft, Molelast to fingers 1-4, Artiflex x 1 from hand to axilla, then short stretch compression bandages: 1-6 cm to hand/wrist, 1-12 cm spiral with X at elbow, and 1-12 cm spiral from wrist to axilla.   10/07/23: Pt had just removed bandages last night. Re measured  circumference and compared to 2x ago measurements (last ones seemed to have gotten a bit out of order) she is reduced.  Manual Therapy MLD to Lt UE as follows: Short neck, superficial and deep abdominals, Lt inguinal nodes, Lt axillo-inguinal anastomosis, then focused on Lt UE working from proximal to distal then retracing all steps back to anastomosis spending extra time on fibrosis at posterior forearm. P/ROM gently to her Lt shoulder into flex and scaption, also gentle grade I-II mobs inferior and posterior, pt continues to report feeling great relief in shoulder tightness with this and looks forward to continuing stretching with her pulleys once they arrive Compression Bandaging to Lt UE: Cocoa butter, TG soft, Molelast to fingers 1-4, Artiflex x 1 from hand to axilla, then short stretch compression  bandages: 1-6 cm to hand/wrist, 1-12 cm spiral with X at elbow, and 1-12 cm spiral from wrist to axilla.   10/02/2023 Pt arrived unwrapped. Wrist and hand area very swollen and ulnar border fibrotic MLD to Lt UE as follows: Short neck, superficial and deep abdominals, Lt inguinal nodes, Lt axillo-inguinal anastomosis, then focused on Lt UE working from proximal to distal then retracing all steps back to anastomosis. Compression Bandaging to Lt UE: Cocoa butter, TG soft, Molelast to fingers 1-4, Artiflex x 1 from hand to axilla, then short stretch compression bandages: 1-6 cm to hand/wrist, 1-12 cm spiral with X at elbow, and 1-12 cm spiral from wrist to axilla.  Discussed the night garment she has at home and determined she was trying to wear the over sleeve under the garment. She will try at home with the sleeve on top to see if it fits better.  She would like to be released from PT and her husband may wrap her, however, we have not seen him wrap. Discussed he can wrap her before her next appt and we can check when she comes in. It would be helpful to have him attend her appt so we may instruct him prn.  May benefit from continuing PT atleast 1x per week until she receives her new garments.  09/29/23: Manual Therapy Removed bandages and assessed skin, no redness noted, pt tolerating these well.  MLD to Lt UE as follows: Short neck, superficial and deep abdominals, Lt inguinal nodes, Lt axillo-inguinal anastomosis, then focused on Lt UE working from proximal to distal then retracing all steps back to anastomosis. P/ROM gently to her Lt shoulder into flex and scaption, also gentle grade I-II mobs inferior and posterior, pt continued to report feeling great relief in shoulder tightness with this Compression Bandaging to Lt UE: Cocoa butter, TG soft, Molelast to fingers 1-4, Artiflex x 1 from hand to axilla, then short stretch compression bandages: 1-6 cm to hand/wrist, 1-12 cm spiral with X at elbow, and 1-12 cm spiral from wrist to axilla.          PATIENT EDUCATION:  Education details: LOS, POC, treatment interventions Person educated: Patient Education method: Explanation Education comprehension: verbalized understanding  HOME EXERCISE PROGRAM:   ASSESSMENT:  CLINICAL IMPRESSION: Spent time IT trainer medical supply today to try to figure out why pt received wrong garments. They found out it was error on their end with new employees so they are ordering correct garment today (she received Medi Harmony instead of custom 550). Pt is going to cont 1x/wk until garments arrive and plans to use her pump and Circaid Profile at home prn in the mean time to manage her lymphedema.   OBJECTIVE IMPAIRMENTS: decreased activity tolerance, decreased knowledge of condition, decreased mobility, decreased ROM, decreased strength, increased edema, impaired UE functional use, and postural dysfunction.   ACTIVITY LIMITATIONS: carrying, lifting, standing, stairs, reach over head, and hygiene/grooming  PARTICIPATION LIMITATIONS: cleaning, shopping, and community activity  PERSONAL FACTORS: 3+  comorbidities: Left Breast Cancer with re;occurrence, radiation, chemotherapy, Lymphedema are also affecting patient's functional outcome.   REHAB POTENTIAL: Good  CLINICAL DECISION MAKING: Stable/uncomplicated  EVALUATION COMPLEXITY: Low  GOALS: Goals reviewed with patient? Yes  SHORT TERM GOALS: Target date: 09/17/2023  Pts husband will be independent with compression bandaging Baseline: Goal status:In Progress  2.  Pt will have 2 cm reduction at 10 cm above ulna styloid process Baseline:  Goal status: MET 09/18/2023  3.  Pt will have 2 cm reduction  at 10 cm prox to olecranon for improved fit of clothing Baseline:  Goal status: MET 09/18/2023 4.  Pt will be tolerant of compression bandaging Baseline:  Goal status: MET 09/18/2023  LONG TERM GOALS: Target date: 10/08/2023  Pt will have decreased edema at 10 cm prox to ulna styloid process by 3 cm or greater for better fit of clothing Baseline:  Goal status:MET 09/18/2023  2.  Pt will have decreased edema at 10 cm prox to olecranon by 3 cm or greater for improved clothig fit Baseline:  Goal status:MET  09/09/2023 3.  Pt will have appropriate day and night garments to control left UE lymphedema Baseline:  Goal status: In Progress Measured but not yet received  4.  Pt/husband will be independent in management of lymphedema Baseline:  Goal status: In Progress   PLAN:  PT FREQUENCY: 1 x/week prn  PT DURATION: 8 weeks, or until she receives appropriate day ad night garments to be independent.  PLANNED INTERVENTIONS: [id number]- PT Re-evaluation, 97110-Therapeutic exercises, 97530- Therapeutic activity, 97535- Self Care, [id number]- Manual therapy, [id number]- Orthotic Initial, [phone]- Orthotic/Prosthetic subsequent, Patient/Family education, and Manual lymph drainage  PLAN FOR NEXT SESSION: Cont CDT until new garments arrive. Review pulleys to assess home technique again.  Review this with her to assess for compensations. Instruct husband when he  comes for 7/7 appt in compression bandaging as she may want to decr freq while waiting for garments.   Addendum: Pt was measured on 09/30/23 for custom daytime flat knit Mediven 550 Lt UE sleeve and glove x 2 and custom Circaid Profile nighttime garment x 1 that she will need x 30 years  Aden Berwyn Caldron, PTA 10/19/2023, 1:18 PM

## 2023-10-20 ENCOUNTER — Other Ambulatory Visit: Payer: Self-pay

## 2023-10-22 ENCOUNTER — Other Ambulatory Visit: Payer: Self-pay | Admitting: *Deleted

## 2023-10-22 ENCOUNTER — Ambulatory Visit: Payer: Self-pay

## 2023-10-22 ENCOUNTER — Other Ambulatory Visit (HOSPITAL_COMMUNITY): Payer: Self-pay

## 2023-10-22 ENCOUNTER — Encounter: Payer: Self-pay | Admitting: Hematology and Oncology

## 2023-10-22 MED ORDER — NERATINIB MALEATE 40 MG PO TABS: 120.0000 mg | ORAL_TABLET | Freq: Every day | ORAL | 0 refills | Status: DC

## 2023-10-26 ENCOUNTER — Ambulatory Visit: Payer: Self-pay

## 2023-10-26 DIAGNOSIS — Z483 Aftercare following surgery for neoplasm: Secondary | ICD-10-CM

## 2023-10-26 DIAGNOSIS — I890 Lymphedema, not elsewhere classified: Secondary | ICD-10-CM | POA: Diagnosis not present

## 2023-10-26 NOTE — Therapy (Signed)
 OUTPATIENT PHYSICAL THERAPY  UPPER EXTREMITY ONCOLOGY TREATMENT  Patient Name: Tami Schmidt MRN: [id number] DOB:Sep 03, 1932, 89 y.o., female Today's Date: 10/26/2023  END OF SESSION:  PT End of Session - 10/26/23 1112     Visit Number 14    Number of Visits 18    Date for PT Re-Evaluation 11/27/23    Authorization Type [employer]    Authorization Time Period 4 visits 10/08/2023 - 11/05/2023    Authorization - Visit Number 14    Authorization - Number of Visits 16    PT Start Time 1106    PT Stop Time 1151   pt requested ending early   PT Time Calculation (min) 45 min    Activity Tolerance Patient tolerated treatment well    Behavior During Therapy WFL for tasks assessed/performed          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   Diabetes mellitus    fasting 90-100   HTN (hypertension) 03/25/2013   Hx of radiation therapy    breasts hx   Hypercholesterolemia    Hypertension    Lymphedema of arm    left arm   Pulmonary embolism (HCC)    Type II or unspecified type diabetes mellitus without mention of complication, not stated as uncontrolled 03/25/2013   Past Surgical History:  Procedure Laterality Date   ABDOMINAL HYSTERECTOMY     ANKLE SURGERY Right 1995   APPENDECTOMY     BIOPSY  01/07/2018   Procedure: BIOPSY;  Surgeon: Saintclair Jasper, MD;  Location: [facility];  Service: Gastroenterology;;   BREAST SURGERY Bilateral    mastectomy   COLONOSCOPY WITH PROPOFOL  N/A 01/07/2018   Procedure: COLONOSCOPY WITH PROPOFOL ;  Surgeon: Saintclair Jasper, MD;  Location: [facility];  Service: Gastroenterology;  Laterality: N/A;   ESOPHAGOGASTRODUODENOSCOPY (EGD) WITH PROPOFOL  N/A 01/06/2018   Procedure: ESOPHAGOGASTRODUODENOSCOPY (EGD) WITH PROPOFOL ;  Surgeon: Saintclair Jasper, MD;  Location: [facility];  Service: Gastroenterology;  Laterality: N/A;    HERNIA REPAIR  04-26-2010   IR IMAGING GUIDED PORT INSERTION  10/29/2021   MASS EXCISION Left 01/26/2013   Procedure: EXCISION LEFT CHEST WALL MASS AND LEFT ABDOMNAL WALL MASS;  Surgeon: Vicenta DELENA Poli, MD;  Location: [facility];  Service: General;  Laterality: Left;   MASS EXCISION Left 09/20/2014   Procedure: EXCISION OF LEFT CHEST WALL MASS;  Surgeon: Vicenta Poli, MD;  Location: [facility];  Service: General;  Laterality: Left;   TEE WITHOUT CARDIOVERSION N/A 12/08/2017   Procedure: TRANSESOPHAGEAL ECHOCARDIOGRAM (TEE);  Surgeon: Pietro Redell RAMAN, MD;  Location: [facility];  Service: Cardiovascular;  Laterality: N/A;   TOTAL HIP ARTHROPLASTY Right 01/05/2017   TOTAL HIP ARTHROPLASTY Right 01/05/2017   Procedure: TOTAL HIP ARTHROPLASTY ANTERIOR APPROACH;  Surgeon: Liam Lerner, MD;  Location: [facility];  Service: Orthopedics;  Laterality: Right;   Patient Active Problem List   Diagnosis Date Noted   Acute lower GI bleeding 01/07/2023   Rectal bleeding 01/05/2023   Acute GI bleeding 10/04/2022   Port-A-Cath in place 06/12/2022   Loss of perception for taste 03/17/2022   Primary osteoarthritis involving multiple joints 03/17/2022   Genetic testing 07/24/2021   Microcytosis 04/29/2021   Hypokalemia 04/28/2021   Pulmonary embolism (HCC)    Agnosia 08/09/2020   Hardening of the aorta (main artery of the heart) (  HCC) 08/09/2020   Morbid obesity (HCC) 08/09/2020   Neuropathy 08/09/2020   Primary osteoarthritis 08/09/2020   Sensorineural hearing loss (SNHL) of both ears 08/07/2020   Anosmia 06/05/2020   Tinnitus, bilateral 06/05/2020   GI bleed 07/13/2019   ABLA (acute blood loss anemia) 07/12/2019   Microcytic anemia 01/05/2018   Melena 01/04/2018   Lower GI bleed 01/04/2018   Diabetes mellitus type II, controlled, with no complications (HCC) 12/05/2017   Tenosynovitis of left wrist 12/02/2017   Infection of left wrist (HCC) 11/30/2017   Primary osteoarthritis of right  hip 01/05/2017   Osteoarthritis of right hip 01/03/2017   Lymphedema of upper extremity 01/17/2014   Osteopenia 01/17/2014   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   Dilated pore of [place]iner 08/02/2013   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 08/02/2013   Scar 08/02/2013   Malignant neoplasm of lower-inner quadrant of left breast in female, estrogen receptor positive (HCC) 06/16/2013   Type II or unspecified type diabetes mellitus without mention of complication, not stated as uncontrolled 03/25/2013   Essential hypertension 03/25/2013   Chest wall recurrence of breast cancer (HCC) 02/21/2013   Abdominal wall mass 01/14/2013    PCP:   REFERRING PROVIDER: Morna Kendall, NP  REFERRING DIAG: Lymphedema s/p breast cancer  THERAPY DIAG:  Lymphedema, not elsewhere classified  Aftercare following surgery for neoplasm  ONSET DATE: 2014 with exacerbation  Rationale for Evaluation and Treatment: Rehabilitation  SUBJECTIVE:                                                                                                                                                                                           SUBJECTIVE STATEMENT:  I want my husband to learn the bandaging. I got the pulleys for home.   EVAL The lymphedema in my arm seems to be worse and the NP wanted me to come see you. She doesn't wear her sleeve much because it doesn't really compress. The night garment is still too  big also. I have not been wrapped or in the sleeve for about 6 months. She also has a Flexi touch that she uses every once in a while.  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 Pts husband has wrapped before and she has a night garment  PAIN:  Are you having pain? No  PRECAUTIONS: right THR and diabetes, prior PE , significant OA bilateral shoulders, knees, left lumpectomy; no LN's RED FLAGS: None   WEIGHT BEARING RESTRICTIONS: No, but she uses a cane for knee OA  FALLS:  Has patient fallen in last 6 months? 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  COGNITION: Overall cognitive status: Within functional limits for tasks assessed   PALPATION: Significant fibrosis noted left forearm, mild pitting noted dorsum of hand and ulnar border of forearm  OBSERVATIONS / OTHER ASSESSMENTS: pt uses SPC, antalgic gait on left  SENSATION: Light touch:    POSTURE: forward head, rounded shoulders  UPPER EXTREMITY AROM/PROM:  A/PROM RIGHT   eval   Shoulder extension   Shoulder flexion 80(OA)  Shoulder abduction   Shoulder internal rotation   Shoulder external rotation     (Blank rows = not tested)  A/PROM LEFT   eval  Shoulder extension   Shoulder flexion 60 (OA)  Shoulder  abduction   Shoulder internal rotation   Shoulder external rotation     (Blank rows = not tested)  CERVICAL AROM: All within functional limits:     UPPER EXTREMITY STRENGTH:   LYMPHEDEMA ASSESSMENTS:   SURGERY TYPE/DATE:  1993: Right lumpectomy axillary dissection in [place]  (stage I) treated with radiation and tamoxifen  x 5 years 2002: Right mastectomy: Right breast recurrence 2004: Left mastectomy: 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 Developed lymphedema after this    2024 reoccurence LN's; had chemotherapy NUMBER OF LYMPH NODES REMOVED: ?  CHEMOTHERAPY: YES, last reoccurence  RADIATION:YES  HORMONE TREATMENT: YES,Neratinib /tamoxifen    INFECTIONS: NO   LYMPHEDEMA ASSESSMENTS:   LANDMARK RIGHT  eval  At axilla  30.3  15 cm proximal to olecranon process 28.3  10 cm proximal to olecranon process 26.7  Olecranon process 22.4  15 cm proximal to ulnar styloid process 20  10 cm proximal to ulnar styloid process 17.3  Just proximal to ulnar styloid process 15.1  Across hand at thumb web space 19.2  At base of 2nd digit 5.95  (Blank rows = not tested)  LANDMARK LEFT  eval Left 09/09/23 LEFT 09/18/2023 Left 10/07/23  At axilla  36.0 33.9 32.2 33.8  15 cm proximal to olecranon process 35.1 32.5 32.7 33  10 cm proximal to olecranon process 35.0 31.3 32.2 29.7  Olecranon process 30.4 27.6 26.7 26.4  15 cm proximal to ulnar styloid process 29.8 27.8 25.7 27.6  10 cm proximal to ulnar styloid process 26.8 25.7 23.2 25  Just proximal to ulnar styloid process 23.4 20.4 19.7 20.7  Across hand at thumb web space 20.6 18.8 18.6 18.5  At base of 2nd digit 6.4 6 5.9 5.7  (Blank rows =  not tested)   FUNCTIONAL TESTS:    GAIT: Distance walked: front desk to room 8 Assistive device utilized: Single point cane Level of assistance: Modified independence Comments: antalgic  gait  LYMPHEDEMA LIFE IMPACT SCALE: 19                                                                                                                            TREATMENT DATE:  10/26/23: Self Care Spent session instructing pts husband in compression bandaging by performing first town of finger bandages, having him complete demo. Showed him the 6 cm hand bandage, then had him return demo and he completed rest of UE bandage with 2 - 12 cm short stretch bandages in spiral fashion with first also having an X at elbow.  Therapeutic Exercise Pulleys into flex, horz abd and scaption to instruct pt in proper technique for her home use of her pulleys. Advised her to avoid pain and only stretch into positions when feeling good stretches, not pain. Pt verbalized good understanding.   10/19/23: Self Care Was speaking with pt about garment and then on the phone off and on with [facility] trying to figure out why pt received wrong garment. They found out the copay she made was for the custom garments but they ordered the wrong sleeve and glove. So this is being reordered today and will try to expedite on their end.  Manual Therapy MLD to Lt UE as follows: Short neck, superficial and deep abdominals, Lt inguinal nodes, Lt axillo-inguinal anastomosis, then focused on Lt UE working from proximal to distal then retracing all steps back to anastomosis spending extra time on fibrosis at posterior forearm. P/ROM gently to her Lt shoulder into flex and scaption with scapular depression by therapist throughout Compression Bandaging to Lt UE: Cocoa butter, TG soft, Molelast to fingers 1-4, Artiflex x 1 from hand to axilla, then short stretch compression bandages: 1-6 cm to hand/wrist, 1-12 cm spiral with X at elbow, and 1-12 cm spiral from wrist to axilla.   10/15/23: Manual Therapy MLD to Lt UE as follows: Short neck, superficial and deep abdominals, Lt inguinal nodes, Lt axillo-inguinal anastomosis, then  focused on Lt UE working from proximal to distal then retracing all steps back to anastomosis spending extra time on fibrosis at posterior forearm. P/ROM gently to her Lt shoulder into flex and scaption, gentle STM to L upper/posterior shoulder Compression Bandaging to Lt UE: Cocoa butter, TG soft, Molelast to fingers 1-4, Artiflex x 1 from hand to axilla, then short stretch compression bandages: 1-6 cm to hand/wrist, 1-12 cm spiral with X at elbow, and 1-12 cm spiral from wrist to axilla.          PATIENT EDUCATION:  Education details: LOS, POC, treatment interventions Person educated: Patient Education method: Explanation Education comprehension: verbalized understanding  HOME EXERCISE PROGRAM:   ASSESSMENT:  CLINICAL IMPRESSION: Pt brought her husband back today and therapist instructed him in compression bandaging. He was  able to return good demo and tactile and VC's. Pt reports the bandage felt comfortable so pt wore bandage home her husband did in clinic today. Then briefly instructed pt in pulleys as she has gotten these for home now. Educated her in decreasing Lt shoulder hike with pulleys and to hold stretches for about 5 sec at a time. Pt able to verbalize good understanding and return good demo. She has one more session scheduled for next week in hopes she will have her new, correct daytime compression garments and will bring her nighttime garment with oversleeve that she hasn't worn in awhile for review of donning.    OBJECTIVE IMPAIRMENTS: decreased activity tolerance, decreased knowledge of condition, decreased mobility, decreased ROM, decreased strength, increased edema, impaired UE functional use, and postural dysfunction.   ACTIVITY LIMITATIONS: carrying, lifting, standing, stairs, reach over head, and hygiene/grooming  PARTICIPATION LIMITATIONS: cleaning, shopping, and community activity  PERSONAL FACTORS: 3+ comorbidities: Left Breast Cancer with re;occurrence,  radiation, chemotherapy, Lymphedema are also affecting patient's functional outcome.   REHAB POTENTIAL: Good  CLINICAL DECISION MAKING: Stable/uncomplicated  EVALUATION COMPLEXITY: Low  GOALS: Goals reviewed with patient? Yes  SHORT TERM GOALS: Target date: 09/17/2023  Pts husband will be independent with compression bandaging Baseline: Goal status:In Progress  2.  Pt will have 2 cm reduction at 10 cm above ulna styloid process Baseline:  Goal status: MET 09/18/2023  3.  Pt will have 2 cm reduction at 10 cm prox to olecranon for improved fit of clothing Baseline:  Goal status: MET 09/18/2023 4.  Pt will be tolerant of compression bandaging Baseline:  Goal status: MET 09/18/2023  LONG TERM GOALS: Target date: 10/08/2023  Pt will have decreased edema at 10 cm prox to ulna styloid process by 3 cm or greater for better fit of clothing Baseline:  Goal status:MET 09/18/2023  2.  Pt will have decreased edema at 10 cm prox to olecranon by 3 cm or greater for improved clothig fit Baseline:  Goal status:MET  09/09/2023 3.  Pt will have appropriate day and night garments to control left UE lymphedema Baseline:  Goal status: In Progress Measured but not yet received  4.  Pt/husband will be independent in management of lymphedema Baseline:  Goal status: In Progress   PLAN:  PT FREQUENCY: 1 x/week prn  PT DURATION: 8 weeks, or until she receives appropriate day ad night garments to be independent.  PLANNED INTERVENTIONS: 97164- PT Re-evaluation, 97110-Therapeutic exercises, 97530- Therapeutic activity, 97535- Self Care, [id number]- Manual therapy, [id number]- Orthotic Initial, [id number]- Orthotic/Prosthetic subsequent, Patient/Family education, and Manual lymph drainage  PLAN FOR NEXT SESSION: Possible D/C next. Pt to bring in new daytime compression garments and previous nighttime garment that she hasn't worn much for review of donning. Then pt would like to schedule eval on ortho side for knee pain  from Dr. Joane.  Addendum: Pt was measured on 09/30/23 for custom daytime flat knit Mediven 550 Lt UE sleeve and glove x 2 and custom Circaid Profile nighttime garment x 1 that she will need x 30 years  Aden Berwyn Caldron, PTA 10/26/2023, 11:58 AM

## 2023-10-27 ENCOUNTER — Encounter: Payer: Self-pay | Admitting: Podiatry

## 2023-10-27 ENCOUNTER — Ambulatory Visit: Admitting: Podiatry

## 2023-10-27 DIAGNOSIS — M79675 Pain in left toe(s): Secondary | ICD-10-CM | POA: Diagnosis not present

## 2023-10-27 DIAGNOSIS — E1142 Type 2 diabetes mellitus with diabetic polyneuropathy: Secondary | ICD-10-CM

## 2023-10-27 DIAGNOSIS — M79674 Pain in right toe(s): Secondary | ICD-10-CM

## 2023-10-27 DIAGNOSIS — I739 Peripheral vascular disease, unspecified: Secondary | ICD-10-CM | POA: Diagnosis not present

## 2023-10-27 DIAGNOSIS — B351 Tinea unguium: Secondary | ICD-10-CM

## 2023-10-27 DIAGNOSIS — M2012 Hallux valgus (acquired), left foot: Secondary | ICD-10-CM | POA: Diagnosis not present

## 2023-10-27 DIAGNOSIS — E119 Type 2 diabetes mellitus without complications: Secondary | ICD-10-CM

## 2023-10-27 DIAGNOSIS — L84 Corns and callosities: Secondary | ICD-10-CM

## 2023-10-27 DIAGNOSIS — M2011 Hallux valgus (acquired), right foot: Secondary | ICD-10-CM

## 2023-10-27 NOTE — Progress Notes (Signed)
 ANNUAL DIABETIC FOOT EXAM  Subjective: Tami Schmidt presents today for annual diabetic foot exam. Chief Complaint  Patient presents with   RFC    RFC non diabetic toenail trim. No PCP.    Patient confirms h/o diabetes.  Patient denies any h/o foot wounds.  Patient has been diagnosed with neuropathy.  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   Diabetes mellitus    fasting 90-100   HTN (hypertension) 03/25/2013   Hx of radiation therapy    breasts hx   Hypercholesterolemia    Hypertension    Lymphedema of arm    left arm   Pulmonary embolism (HCC)    Type II or unspecified type diabetes mellitus without mention of complication, not stated as uncontrolled 03/25/2013   Patient Active Problem List   Diagnosis Date Noted   Acute lower GI bleeding 01/07/2023   Rectal bleeding 01/05/2023   Acute GI bleeding 10/04/2022   Port-A-Cath in place 06/12/2022   Loss of perception for taste 03/17/2022   Primary osteoarthritis involving multiple joints 03/17/2022   Genetic testing 07/24/2021   Microcytosis 04/29/2021   Hypokalemia 04/28/2021   Pulmonary embolism (HCC)    Agnosia 08/09/2020   Hardening of the aorta (main artery of the heart) (HCC) 08/09/2020   Morbid obesity (HCC) 08/09/2020   Neuropathy 08/09/2020   Primary osteoarthritis 08/09/2020   Sensorineural hearing loss (SNHL) of both ears 08/07/2020   Anosmia 06/05/2020   Tinnitus, bilateral 06/05/2020   GI bleed 07/13/2019   ABLA (acute blood loss anemia) 07/12/2019   Microcytic anemia 01/05/2018   Melena 01/04/2018   Lower GI bleed 01/04/2018   Diabetes mellitus type II, controlled, with no complications (HCC) 12/05/2017   Tenosynovitis of left wrist 12/02/2017   Infection of left wrist (HCC) 11/30/2017   Primary osteoarthritis of right hip 01/05/2017   Osteoarthritis of  right hip 01/03/2017   Lymphedema of upper extremity 01/17/2014   Osteopenia 01/17/2014   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   Dilated pore of [place] 08/02/2013   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 08/02/2013   Scar 08/02/2013   Malignant neoplasm of lower-inner quadrant of left breast in female, estrogen receptor positive (HCC) 06/16/2013   Type II or unspecified type diabetes mellitus without mention of complication, not stated as uncontrolled 03/25/2013   Essential hypertension 03/25/2013   Chest wall recurrence of breast cancer (HCC) 02/21/2013   Abdominal wall mass 01/14/2013   Past Surgical History:  Procedure Laterality Date   ABDOMINAL HYSTERECTOMY     ANKLE SURGERY Right 1995   APPENDECTOMY     BIOPSY  01/07/2018   Procedure: BIOPSY;  Surgeon: Saintclair Jasper, MD;  Location: [facility];  Service: Gastroenterology;;   BREAST SURGERY Bilateral    mastectomy   COLONOSCOPY WITH PROPOFOL  N/A 01/07/2018   Procedure: COLONOSCOPY WITH PROPOFOL ;  Surgeon: Saintclair Jasper, MD;  Location: [facility];  Service: Gastroenterology;  Laterality: N/A;   ESOPHAGOGASTRODUODENOSCOPY (EGD) WITH PROPOFOL  N/A 01/06/2018   Procedure: ESOPHAGOGASTRODUODENOSCOPY (EGD) WITH PROPOFOL ;  Surgeon: Saintclair Jasper, MD;  Location: [facility];  Service: Gastroenterology;  Laterality: N/A;   HERNIA REPAIR  04-26-2010   IR IMAGING GUIDED PORT INSERTION  10/29/2021   MASS EXCISION Left 01/26/2013   Procedure: EXCISION LEFT CHEST WALL MASS AND LEFT ABDOMNAL WALL MASS;  Surgeon: Vicenta DELENA Poli, MD;  Location: [facility] OR;  Service: General;  Laterality: Left;   MASS EXCISION Left 09/20/2014   Procedure: EXCISION OF LEFT CHEST WALL MASS;  Surgeon: Vicenta Poli, MD;  Location: [facility];  Service: General;  Laterality: Left;   TEE WITHOUT CARDIOVERSION N/A 12/08/2017   Procedure: TRANSESOPHAGEAL ECHOCARDIOGRAM (TEE);  Surgeon: Pietro Redell RAMAN, MD;  Location: [facility]  ENDOSCOPY;  Service: Cardiovascular;  Laterality: N/A;   TOTAL HIP ARTHROPLASTY Right 01/05/2017   TOTAL HIP ARTHROPLASTY Right 01/05/2017   Procedure: TOTAL HIP ARTHROPLASTY ANTERIOR APPROACH;  Surgeon: Liam Lerner, MD;  Location: [facility] OR;  Service: Orthopedics;  Laterality: Right;   Current Outpatient Medications on File Prior to Visit  Medication Sig Dispense Refill   acetaminophen  (TYLENOL ) 500 MG tablet Take 1,000 mg by mouth as needed for moderate pain.     amLODipine  (NORVASC ) 10 MG tablet TAKE 1 TABLET BY MOUTH DAILY 90 tablet 3   diclofenac  Sodium (VOLTAREN ) 1 % GEL Apply 2 g topically 4 (four) times daily. 100 g 3   ferrous sulfate  325 (65 FE) MG EC tablet Take 1 tablet by mouth every morning.     gabapentin  (NEURONTIN ) 100 MG capsule Take 1 capsule (100 mg total) by mouth at bedtime. 60 capsule 0   Multiple Vitamin (MULTIVITAMIN) tablet Take 1 tablet by mouth daily.     Neratinib  Maleate (NERLYNX ) 40 MG tablet Take 3 tablets (120 mg total) by mouth daily. Take with food. 180 tablet 0   Turmeric (QC TUMERIC COMPLEX PO) Take 1 tablet by mouth daily.     VITAMIN D PO Take 1 tablet by mouth daily.     No current facility-administered medications on file prior to visit.    Allergies  Allergen Reactions   Bactrim Swelling    SWELLING OF MOUTH/FACE.   Lisinopril Swelling    SWELLING OF MOUTH/FACE.   Vasotec Swelling    SWELLING OF MOUTH/FACE.   Sulfamethoxazole-Trimethoprim     Other reaction(s): Unknown   Social History   Occupational History   Occupation: retired Child psychotherapist  Tobacco Use   Smoking status: Former    Current packs/day: 0.00    Types: Cigarettes    Quit date: 04/14/1972    Years since quitting: 51.5   Smokeless tobacco: Never  Vaping Use   Vaping status: Never Used  Substance and Sexual Activity   Alcohol use: Not Currently    Comment: occasional   Drug use: Not Currently   Sexual activity: Yes    Birth control/protection: Surgical   Family History   Problem Relation Age of Onset   Breast cancer Cousin        maternal first cousin   Uterine cancer Cousin        maternal first cousin   Immunization History  Administered Date(s) Administered   Fluad Trivalent(High Dose 65+) 01/07/2023   Influenza, High Dose Seasonal PF 01/08/2017, 01/12/2019   Influenza, Quadrivalent, Recombinant, Inj, Pf 01/12/2019   PNEUMOCOCCAL CONJUGATE-20 01/07/2023     Review of Systems: Negative except as noted in the HPI.   Objective: There were no vitals filed for this visit.  Tami Schmidt is a pleasant 89 y.o. female in NAD. AAO X 3.  Diabetic foot exam was performed with the following findings:   Vascular Examination: CFT <3 seconds b/l. DP pulses faintly palpable b/l. PT pulses nonpalpable b/l. Digital hair absent. Skin temperature gradient warm to warm b/l. No pain with calf compression. No ischemia  or gangrene. No cyanosis or clubbing noted b/l.    Neurological Examination: Sensation grossly intact b/l with 10 gram monofilament. Vibratory sensation intact b/l.   Dermatological Examination: Pedal skin warm and supple b/l. No open wounds b/l. No interdigital macerations. Toenails 1-5 b/l thick, discolored, elongated with subungual debris and pain on dorsal palpation.  Hyperkeratotic lesion(s) submet head 5 right foot and sub 5th met base right foot.  No erythema, no edema, no drainage, no fluctuance.  Musculoskeletal Examination: Muscle strength 5/5 to all lower extremity muscle groups bilaterally. HAV with bunion deformity noted b/l LE. Hammertoe deformity noted 2-5 b/l. Pes planus deformity noted bilateral LE.  Radiographs: None     Lab Results  Component Value Date   HGBA1C 5.9 (H) 07/16/2023   ADA Risk Categorization: High Risk  Patient has one or more of the following: Loss of protective sensation Absent pedal pulses Severe Foot deformity History of foot ulcer  Assessment: 1. Pain due to onychomycosis of toenails of both feet    2. Callus   3. PVD (peripheral vascular disease) (HCC)   4. Hallux valgus, acquired, bilateral   5. Diabetic peripheral neuropathy associated with type 2 diabetes mellitus (HCC)   6. Encounter for diabetic foot exam (HCC)     Plan: Diabetic foot examination performed today. All patient's and/or POA's questions/concerns addressed on today's visit. Toenails 1-5 debrided in length and girth without incident. Callus(es) submet head 5 right foot and sub 5th met base right foot pared with sharp debridement without incident. Continue daily foot inspections and monitor blood glucose per PCP/Endocrinologist's recommendations.Continue soft, supportive shoe gear daily. Report any pedal injuries to medical professional. Call office if there are any questions/concerns. Return in about 3 months (around 01/27/2024).  Delon LITTIE Merlin, DPM      [place] LOCATION: [address] [place] [address]                   Office [phone]   [facility] LOCATION: [address] [place], [place] [address] Office [phone]

## 2023-10-28 ENCOUNTER — Other Ambulatory Visit: Payer: Self-pay | Admitting: *Deleted

## 2023-10-28 ENCOUNTER — Other Ambulatory Visit: Payer: Self-pay

## 2023-10-28 MED ORDER — TAMOXIFEN CITRATE 20 MG PO TABS: 20.0000 mg | ORAL_TABLET | Freq: Every day | ORAL | 3 refills | Status: AC

## 2023-11-05 ENCOUNTER — Ambulatory Visit: Payer: Self-pay

## 2023-11-05 DIAGNOSIS — Z483 Aftercare following surgery for neoplasm: Secondary | ICD-10-CM

## 2023-11-05 DIAGNOSIS — I890 Lymphedema, not elsewhere classified: Secondary | ICD-10-CM

## 2023-11-05 NOTE — Therapy (Addendum)
 OUTPATIENT PHYSICAL THERAPY  UPPER EXTREMITY ONCOLOGY TREATMENT  Patient Name: Tami Schmidt MRN: [id number] DOB:01-Oct-1932, 89 y.o., female Today's Date: 11/05/2023  END OF SESSION:  PT End of Session - 11/05/23 1143     Visit Number 15    Number of Visits 18    Date for PT Re-Evaluation 11/27/23    Authorization Type [employer]    Authorization Time Period 4 visits 10/08/2023 - 11/05/2023    Authorization - Visit Number 15    Authorization - Number of Visits 16    PT Start Time 1104    PT Stop Time 1150    PT Time Calculation (min) 46 min    Activity Tolerance Patient tolerated treatment well    Behavior During Therapy WFL for tasks assessed/performed          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   Diabetes mellitus    fasting 90-100   HTN (hypertension) 03/25/2013   Hx of radiation therapy    breasts hx   Hypercholesterolemia    Hypertension    Lymphedema of arm    left arm   Pulmonary embolism (HCC)    Type II or unspecified type diabetes mellitus without mention of complication, not stated as uncontrolled 03/25/2013   Past Surgical History:  Procedure Laterality Date   ABDOMINAL HYSTERECTOMY     ANKLE SURGERY Right 1995   APPENDECTOMY     BIOPSY  01/07/2018   Procedure: BIOPSY;  Surgeon: Saintclair Jasper, MD;  Location: [facility];  Service: Gastroenterology;;   BREAST SURGERY Bilateral    mastectomy   COLONOSCOPY WITH PROPOFOL  N/A 01/07/2018   Procedure: COLONOSCOPY WITH PROPOFOL ;  Surgeon: Saintclair Jasper, MD;  Location: [facility];  Service: Gastroenterology;  Laterality: N/A;   ESOPHAGOGASTRODUODENOSCOPY (EGD) WITH PROPOFOL  N/A 01/06/2018   Procedure: ESOPHAGOGASTRODUODENOSCOPY (EGD) WITH PROPOFOL ;  Surgeon: Saintclair Jasper, MD;  Location: [facility];  Service: Gastroenterology;  Laterality: N/A;   HERNIA REPAIR  04-26-2010   IR  IMAGING GUIDED PORT INSERTION  10/29/2021   MASS EXCISION Left 01/26/2013   Procedure: EXCISION LEFT CHEST WALL MASS AND LEFT ABDOMNAL WALL MASS;  Surgeon: Vicenta DELENA Poli, MD;  Location: [facility];  Service: General;  Laterality: Left;   MASS EXCISION Left 09/20/2014   Procedure: EXCISION OF LEFT CHEST WALL MASS;  Surgeon: Vicenta Poli, MD;  Location: [facility];  Service: General;  Laterality: Left;   TEE WITHOUT CARDIOVERSION N/A 12/08/2017   Procedure: TRANSESOPHAGEAL ECHOCARDIOGRAM (TEE);  Surgeon: Pietro Redell RAMAN, MD;  Location: [facility];  Service: Cardiovascular;  Laterality: N/A;   TOTAL HIP ARTHROPLASTY Right 01/05/2017   TOTAL HIP ARTHROPLASTY Right 01/05/2017   Procedure: TOTAL HIP ARTHROPLASTY ANTERIOR APPROACH;  Surgeon: Liam Lerner, MD;  Location: [facility];  Service: Orthopedics;  Laterality: Right;   Patient Active Problem List   Diagnosis Date Noted   Acute lower GI bleeding 01/07/2023   Rectal bleeding 01/05/2023   Acute GI bleeding 10/04/2022   Port-A-Cath in place 06/12/2022   Loss of perception for taste 03/17/2022   Primary osteoarthritis involving multiple joints 03/17/2022   Genetic testing 07/24/2021   Microcytosis 04/29/2021   Hypokalemia 04/28/2021   Pulmonary embolism (HCC)    Agnosia 08/09/2020   Hardening of the aorta (main artery of the heart) (HCC) 08/09/2020   Morbid  obesity (HCC) 08/09/2020   Neuropathy 08/09/2020   Primary osteoarthritis 08/09/2020   Sensorineural hearing loss (SNHL) of both ears 08/07/2020   Anosmia 06/05/2020   Tinnitus, bilateral 06/05/2020   GI bleed 07/13/2019   ABLA (acute blood loss anemia) 07/12/2019   Microcytic anemia 01/05/2018   Melena 01/04/2018   Lower GI bleed 01/04/2018   Diabetes mellitus type II, controlled, with no complications (HCC) 12/05/2017   Tenosynovitis of left wrist 12/02/2017   Infection of left wrist (HCC) 11/30/2017   Primary osteoarthritis of right hip 01/05/2017    Osteoarthritis of right hip 01/03/2017   Lymphedema of upper extremity 01/17/2014   Osteopenia 01/17/2014   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   Dilated pore of Winer 08/02/2013   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 08/02/2013   Scar 08/02/2013   Malignant neoplasm of lower-inner quadrant of left breast in female, estrogen receptor positive (HCC) 06/16/2013   Type II or unspecified type diabetes mellitus without mention of complication, not stated as uncontrolled 03/25/2013   Essential hypertension 03/25/2013   Chest wall recurrence of breast cancer (HCC) 02/21/2013   Abdominal wall mass 01/14/2013    PCP:   REFERRING PROVIDER: Morna Kendall, NP  REFERRING DIAG: Lymphedema s/p breast cancer  THERAPY DIAG:  Lymphedema, not elsewhere classified  Aftercare following surgery for neoplasm  ONSET DATE: 2014 with exacerbation  Rationale for Evaluation and Treatment: Rehabilitation  SUBJECTIVE:                                                                                                                                                                                           SUBJECTIVE STATEMENT:  My husband kept me in bandages and he did a great job wrapping me. My new sleeve and glove came in but I don't know if I like the color or not.   EVAL The lymphedema in my arm seems to be worse and the NP wanted me to come see you. She doesn't wear her sleeve much because it doesn't really compress. The night garment is still too  big also. I have not been wrapped or in the sleeve for about 6 months. She also has a Flexi touch that she uses every once in a while.  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 Pts husband has wrapped before and she has a night garment  PAIN:  Are you having pain? No  PRECAUTIONS: right THR and diabetes, prior PE , significant OA bilateral shoulders, knees, left lumpectomy; no LN's RED FLAGS: None   WEIGHT BEARING RESTRICTIONS: No, but she uses a cane for knee OA  FALLS:  Has patient fallen in last 6 months? 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  COGNITION: Overall cognitive status: Within functional limits for tasks assessed   PALPATION: Significant fibrosis noted left forearm, mild pitting noted dorsum of hand and ulnar border of forearm  OBSERVATIONS / OTHER ASSESSMENTS: pt uses SPC, antalgic gait on left  SENSATION: Light touch:    POSTURE: forward head, rounded shoulders  UPPER EXTREMITY AROM/PROM:  A/PROM RIGHT   eval   Shoulder extension   Shoulder flexion 80(OA)  Shoulder abduction   Shoulder internal rotation   Shoulder external rotation     (Blank rows = not tested)  A/PROM LEFT   eval  Shoulder extension    Shoulder flexion 60 (OA)  Shoulder abduction   Shoulder internal rotation   Shoulder external rotation     (Blank rows = not tested)  CERVICAL AROM: All within functional limits:     UPPER EXTREMITY STRENGTH:   LYMPHEDEMA ASSESSMENTS:   SURGERY TYPE/DATE:  1993: Right lumpectomy axillary dissection in [place]  (stage I) treated with radiation and tamoxifen  x 5 years 2002: Right mastectomy: Right breast recurrence 2004: Left mastectomy: 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 Developed lymphedema after this    2024 reoccurence LN's; had chemotherapy NUMBER OF LYMPH NODES REMOVED: ?  CHEMOTHERAPY: YES, last reoccurence  RADIATION:YES  HORMONE TREATMENT: YES,Neratinib /tamoxifen    INFECTIONS: NO   LYMPHEDEMA ASSESSMENTS:   LANDMARK RIGHT  eval  At axilla  30.3  15 cm proximal to olecranon process 28.3  10 cm proximal to olecranon process 26.7  Olecranon process 22.4  15 cm proximal to ulnar styloid process 20  10 cm proximal to ulnar styloid process 17.3  Just proximal to ulnar styloid process 15.1  Across hand at thumb web space 19.2  At base of 2nd digit 5.95  (Blank rows = not tested)  LANDMARK LEFT  eval Left 09/09/23 LEFT 09/18/2023 Left 10/07/23  At axilla  36.0 33.9 32.2 33.8  15 cm proximal to olecranon process 35.1 32.5 32.7 33  10 cm proximal to olecranon process 35.0 31.3 32.2 29.7  Olecranon process 30.4 27.6 26.7 26.4  15 cm proximal to ulnar styloid process 29.8 27.8 25.7 27.6  10 cm proximal to ulnar styloid process 26.8 25.7 23.2 25  Just proximal to ulnar styloid process 23.4 20.4 19.7 20.7  Across hand at thumb web space 20.6 18.8 18.6 18.5  At base of 2nd digit 6.4 6 5.9 5.7  (Blank rows = not tested)   FUNCTIONAL TESTS:    GAIT: Distance walked: front desk to room 8 Assistive device utilized: Single point cane Level of assistance:  Modified independence Comments: antalgic gait  LYMPHEDEMA LIFE IMPACT SCALE: 19                                                                                                                            TREATMENT DATE:  11/05/23: Orthotic Fit Initial Pt brought her new compression sleeve and glove, and her old nighttime garment that she rarely uses because she reports it being shipped to her years ago and she never brought it in to the clinic to learn how to wear it. At first pt wasn't pleased with color of garment Medi had different color options than Jobst. However, once therapist donned it for her, she reports liking the color more than the caramel option (had one in clinic to show her) and also that it was very comfortable and she wanted to keep it. Then donned her nighttime Tribute nighttime garment and instructed her on how to attach the velcro straps and how it should feel along with correct placement on arm. Pt was very pleased overall with her day and nighttime garments.  Self Care Discussed Maintenance phase of lymphedema now that she will be transitioning into this phase. Answered her questions about garments and reminded her to only wear day time garment during day, not at night. Also reminded her that if she does ever have an issue with reflux of lymph fluid her husband can always apply compression bandages for a few days until she reduces again. Also that she will need to call us  to be re measured in next 6-9 months for another garment as that is the approximate wear life of the day time garments. Pt verbalized good understanding.   10/26/23: Self Care Spent session instructing pts husband in compression bandaging by performing first town of finger bandages, having him complete demo. Showed him the 6 cm hand bandage, then had him return demo and he completed rest of UE bandage with 2 - 12 cm short stretch bandages in spiral fashion with first also having an X at elbow.  Therapeutic  Exercise Pulleys into flex, horz abd and scaption to instruct pt in proper technique for her home use of her pulleys. Advised her to avoid pain and only stretch into positions when feeling good stretches, not pain. Pt verbalized good understanding.   10/19/23: Self Care Was speaking with pt about garment and then on the phone off and on with [facility] trying to figure out why pt received wrong garment. They found out the copay she made was for the custom garments but they ordered the wrong sleeve and glove. So this is being reordered today and will try to expedite on their end.  Manual Therapy MLD to Lt UE as follows: Short neck, superficial  and deep abdominals, Lt inguinal nodes, Lt axillo-inguinal anastomosis, then focused on Lt UE working from proximal to distal then retracing all steps back to anastomosis spending extra time on fibrosis at posterior forearm. P/ROM gently to her Lt shoulder into flex and scaption with scapular depression by therapist throughout Compression Bandaging to Lt UE: Cocoa butter, TG soft, Molelast to fingers 1-4, Artiflex x 1 from hand to axilla, then short stretch compression bandages: 1-6 cm to hand/wrist, 1-12 cm spiral with X at elbow, and 1-12 cm spiral from wrist to axilla.        PATIENT EDUCATION:  Education details: LOS, POC, treatment interventions Person educated: Patient Education method: Explanation Education comprehension: verbalized understanding  HOME EXERCISE PROGRAM:   ASSESSMENT:  CLINICAL IMPRESSION: Pt has now met all goals and is doing great. She loves her new daytime compression garments and these are a great fit and was educated in proper donning of old nighttime garment. She is ready for D/C at this time.     OBJECTIVE IMPAIRMENTS: decreased activity tolerance, decreased knowledge of condition, decreased mobility, decreased ROM, decreased strength, increased edema, impaired UE functional use, and postural dysfunction.    ACTIVITY LIMITATIONS: carrying, lifting, standing, stairs, reach over head, and hygiene/grooming  PARTICIPATION LIMITATIONS: cleaning, shopping, and community activity  PERSONAL FACTORS: 3+ comorbidities: Left Breast Cancer with re;occurrence, radiation, chemotherapy, Lymphedema are also affecting patient's functional outcome.   REHAB POTENTIAL: Good  CLINICAL DECISION MAKING: Stable/uncomplicated  EVALUATION COMPLEXITY: Low  GOALS: Goals reviewed with patient? Yes  SHORT TERM GOALS: Target date: 09/17/2023  Pts husband will be independent with compression bandaging Baseline: Goal status: MET 2.  Pt will have 2 cm reduction at 10 cm above ulna styloid process Baseline:  Goal status: MET 09/18/2023  3.  Pt will have 2 cm reduction at 10 cm prox to olecranon for improved fit of clothing Baseline:  Goal status: MET 09/18/2023 4.  Pt will be tolerant of compression bandaging Baseline:  Goal status: MET 09/18/2023  LONG TERM GOALS: Target date: 10/08/2023  Pt will have decreased edema at 10 cm prox to ulna styloid process by 3 cm or greater for better fit of clothing Baseline:  Goal status:MET 09/18/2023  2.  Pt will have decreased edema at 10 cm prox to olecranon by 3 cm or greater for improved clothig fit Baseline:  Goal status:MET  09/09/2023 3.  Pt will have appropriate day and night garments to control left UE lymphedema Baseline:  Goal status: MET  4.  Pt/husband will be independent in management of lymphedema Baseline:  Goal status: MET   PLAN:  PT FREQUENCY: 1 x/week prn  PT DURATION: 8 weeks, or until she receives appropriate day ad night garments to be independent.  PLANNED INTERVENTIONS: 97164- PT Re-evaluation, 97110-Therapeutic exercises, 97530- Therapeutic activity, 97535- Self Care, [id number]- Manual therapy, [id number]- Orthotic Initial, [id number]- Orthotic/Prosthetic subsequent, Patient/Family education, and Manual lymph drainage  PLAN FOR NEXT SESSION: D/C this  session.   Addendum: Pt was measured on 09/30/23 for custom daytime flat knit Mediven 550 Lt UE sleeve and glove x 2 and custom Circaid Profile nighttime garment x 1 that she will need x 30 years  PHYSICAL THERAPY DISCHARGE SUMMARY  Visits from Start of Care: 15  Current functional level related to goals / functional outcomes: Achieved goals established; She had good edema reduction and husband is able to wrap pt when needed   Remaining deficits: None   Education / Equipment: Pt received new compression garments and  she she was educated in proper wear of day and night garments, and educated in maintenance phase of treatment.  Patient agrees to discharge. Patient goals were met. Patient is being discharged due to meeting the stated rehab goals.   Aden Berwyn Caldron, PTA 11/05/2023, 12:30 PM Grayce Sheldon, PT 11/08/23 11:31 AM

## 2023-11-09 ENCOUNTER — Telehealth: Payer: Self-pay

## 2023-11-09 NOTE — Telephone Encounter (Signed)
 Phone call to confirm appointment for 11/10/2023 Was confirmed

## 2023-11-09 NOTE — Telephone Encounter (Signed)
 Left message to confirm appt for 7/29

## 2023-11-10 ENCOUNTER — Inpatient Hospital Stay: Attending: Adult Health | Admitting: Hematology and Oncology

## 2023-11-10 VITALS — BP 144/54 | HR 83 | Temp 97.5°F | Resp 24 | Wt 169.4 lb

## 2023-11-10 DIAGNOSIS — R432 Parageusia: Secondary | ICD-10-CM | POA: Insufficient documentation

## 2023-11-10 DIAGNOSIS — C50312 Malignant neoplasm of lower-inner quadrant of left female breast: Secondary | ICD-10-CM | POA: Diagnosis not present

## 2023-11-10 DIAGNOSIS — Z170 Estrogen receptor positive status [ER+]: Secondary | ICD-10-CM | POA: Diagnosis not present

## 2023-11-10 DIAGNOSIS — C773 Secondary and unspecified malignant neoplasm of axilla and upper limb lymph nodes: Secondary | ICD-10-CM | POA: Diagnosis present

## 2023-11-10 DIAGNOSIS — Z79810 Long term (current) use of selective estrogen receptor modulators (SERMs): Secondary | ICD-10-CM | POA: Diagnosis not present

## 2023-11-10 DIAGNOSIS — R599 Enlarged lymph nodes, unspecified: Secondary | ICD-10-CM

## 2023-11-10 DIAGNOSIS — R430 Anosmia: Secondary | ICD-10-CM | POA: Diagnosis not present

## 2023-11-10 DIAGNOSIS — K5900 Constipation, unspecified: Secondary | ICD-10-CM | POA: Insufficient documentation

## 2023-11-10 NOTE — Progress Notes (Signed)
 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) Staging form: Breast, AJCC 7th Edition - Clinical: Stage Unknown (TX, N1, M0) - Signed by Layla Sandria BROCKS, MD on 07/27/2014  Malignant neoplasm of lower-inner quadrant of left breast in female, estrogen receptor positive (HCC) Staging form: Breast, AJCC 7th Edition - Clinical: Stage IA (T1b, N0, M0) - Signed by Layla Sandria BROCKS, MD on 07/27/2014   SUMMARY OF ONCOLOGIC HISTORY: 1993: Right lumpectomy axillary dissection in [place]  (stage I) treated with radiation and tamoxifen  x 5 years 2002: Right mastectomy: Right breast recurrence 2004: Left mastectomy: 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  CURRENT THERAPY: Neratinib /tamoxifen   INTERVAL HISTORY:  JINX GILDEN 89 y.o. female returns for follow-up while on neratinib  and tamoxifen .  Discussed the use of AI scribe software for clinical note transcription with the patient, who gave verbal consent to proceed.  History of Present Illness Tami Schmidt is an 89 year old female with cancer who presents for follow-up regarding her treatment and disease status.  She is currently on oral medications, including tamoxifen  and neratinib , which she has been taking for about 3 months without issues. She is anxious about her condition and inquires about the status of her cancer.  She has experienced a significant weight  loss of 50 pounds, from 220 to 170 pounds, which she attributes to a previous infusion treatment. She clarifies that she has not received traditional chemotherapy but an antibody drug conjugate, which she believes caused the weight loss.  She reports a persistent loss of taste and smell for over a year, which she attributes to the infusion treatment. She can taste sweet and spicy foods but cannot taste or smell coffee or other strong spices. No history of COVID-19 infection.  She experiences constipation and uses a herbal remedy to manage it. No issues with urination, although she mentions frequent nighttime urination.  She mentions arthritis causing significant discomfort, but she manages without a cane. She stays indoors due to the heat and acknowledges that she may not be drinking enough water during the summer.     Patient Active Problem List   Diagnosis Date Noted   Acute lower GI bleeding 01/07/2023   Rectal bleeding 01/05/2023   Acute GI bleeding 10/04/2022   Port-A-Cath in place 06/12/2022   Loss of perception for taste 03/17/2022   Primary osteoarthritis involving multiple joints 03/17/2022   Genetic testing 07/24/2021   Microcytosis 04/29/2021   Hypokalemia 04/28/2021   Pulmonary embolism (HCC)    Agnosia 08/09/2020   Hardening of the aorta (main artery of the heart) (HCC) 08/09/2020   Morbid obesity (HCC) 08/09/2020   Neuropathy 08/09/2020   Primary osteoarthritis 08/09/2020   Sensorineural hearing loss (SNHL) of both ears 08/07/2020   Anosmia 06/05/2020   Tinnitus, bilateral 06/05/2020   GI bleed 07/13/2019   ABLA (acute blood loss anemia) 07/12/2019   Microcytic anemia 01/05/2018   Melena 01/04/2018   Lower GI bleed 01/04/2018   Diabetes mellitus  type II, controlled, with no complications (HCC) 12/05/2017   Tenosynovitis of left wrist 12/02/2017   Infection of left wrist (HCC) 11/30/2017   Primary osteoarthritis of right hip 01/05/2017   Osteoarthritis of right  hip 01/03/2017   Lymphedema of upper extremity 01/17/2014   Osteopenia 01/17/2014   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   Dilated pore of Winer 08/02/2013   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   Diabetes mellitus    fasting 90-100   HTN (hypertension) 03/25/2013   Hx of radiation therapy    breasts hx   Hypercholesterolemia    Hypertension    Lymphedema of arm    left arm   Pulmonary embolism (HCC)    Type II or unspecified type diabetes mellitus without mention of complication, not stated as uncontrolled 03/25/2013    SURGICAL HISTORY: Past Surgical History:  Procedure Laterality Date   ABDOMINAL HYSTERECTOMY     ANKLE SURGERY Right 1995   APPENDECTOMY     BIOPSY  01/07/2018   Procedure: BIOPSY;  Surgeon: Saintclair Jasper, MD;  Location: [facility];  Service: Gastroenterology;;   BREAST SURGERY Bilateral    mastectomy   COLONOSCOPY WITH PROPOFOL  N/A 01/07/2018   Procedure: COLONOSCOPY WITH PROPOFOL ;  Surgeon: Saintclair Jasper, MD;  Location: [facility];  Service: Gastroenterology;  Laterality: N/A;   ESOPHAGOGASTRODUODENOSCOPY (EGD) WITH PROPOFOL  N/A 01/06/2018   Procedure: ESOPHAGOGASTRODUODENOSCOPY (EGD) WITH PROPOFOL ;  Surgeon: Saintclair Jasper, MD;  Location: [facility];   Service: Gastroenterology;  Laterality: N/A;   HERNIA REPAIR  04-26-2010   IR IMAGING GUIDED PORT INSERTION  10/29/2021   MASS EXCISION Left 01/26/2013   Procedure: EXCISION LEFT CHEST WALL MASS AND LEFT ABDOMNAL WALL MASS;  Surgeon: Vicenta DELENA Poli, MD;  Location: [facility] OR;  Service: General;  Laterality: Left;   MASS EXCISION Left 09/20/2014   Procedure: EXCISION OF LEFT CHEST WALL MASS;  Surgeon: Vicenta Poli, MD;  Location: [facility];  Service: General;  Laterality: Left;   TEE WITHOUT CARDIOVERSION N/A 12/08/2017   Procedure: TRANSESOPHAGEAL ECHOCARDIOGRAM (TEE);  Surgeon: Pietro Redell RAMAN, MD;  Location: [facility];  Service: Cardiovascular;  Laterality: N/A;   TOTAL HIP ARTHROPLASTY Right 01/05/2017   TOTAL HIP ARTHROPLASTY Right 01/05/2017   Procedure: TOTAL HIP ARTHROPLASTY ANTERIOR APPROACH;  Surgeon: Liam Lerner, MD;  Location: [facility] OR;  Service: Orthopedics;  Laterality: Right;    SOCIAL HISTORY: Social History   Socioeconomic History   Marital status: Married    Spouse name: Not on file   Number of children: Not on file   Years of education: Not on file   Highest education level: Not on file  Occupational History   Occupation: retired Child psychotherapist  Tobacco Use   Smoking status: Former    Current packs/day: 0.00    Types: Cigarettes  Quit date: 04/14/1972    Years since quitting: 51.6   Smokeless tobacco: Never  Vaping Use   Vaping status: Never Used  Substance and Sexual Activity   Alcohol use: Not Currently    Comment: occasional   Drug use: Not Currently   Sexual activity: Yes    Birth control/protection: Surgical  Other Topics Concern   Not on file  Social History Narrative   Not on file   Social Drivers of Health   Financial Resource Strain: Not on file  Food Insecurity: No Food Insecurity (05/21/2023)   Hunger Vital Sign    Worried About Running Out of Food in the Last Year: Never true    Ran Out of Food in the Last Year: Never true   Transportation Needs: No Transportation Needs (05/21/2023)   PRAPARE - Administrator, Civil Service (Medical): No    Lack of Transportation (Non-Medical): No  Physical Activity: Not on file  Stress: Not on file  Social Connections: Socially Integrated (05/21/2023)   Social Connection and Isolation Panel    Frequency of Communication with Friends and Family: More than three times a week    Frequency of Social Gatherings with Friends and Family: More than three times a week    Attends Religious Services: More than 4 times per year    Active Member of [employer] or Organizations: Yes    Attends Engineer, structural: More than 4 times per year    Marital Status: Married  Catering manager Violence: Not At Risk (05/21/2023)   Humiliation, Afraid, Rape, and Kick questionnaire    Fear of Current or Ex-Partner: No    Emotionally Abused: No    Physically Abused: No    Sexually Abused: No    FAMILY HISTORY: Family History  Problem Relation Age of Onset   Breast cancer Cousin        maternal first cousin   Uterine cancer Cousin        maternal first cousin    Review of Systems  Constitutional:  Negative for appetite change, chills, fatigue, fever and unexpected weight change.  HENT:   Negative for hearing loss, lump/mass and trouble swallowing.   Eyes:  Negative for eye problems and icterus.  Respiratory:  Negative for chest tightness, cough and shortness of breath.   Cardiovascular:  Negative for chest pain, leg swelling and palpitations.  Gastrointestinal:  Positive for constipation. Negative for abdominal distention, abdominal pain, blood in stool, diarrhea, nausea, rectal pain and vomiting.  Endocrine: Negative for hot flashes.  Genitourinary:  Negative for difficulty urinating.   Musculoskeletal:  Positive for arthralgias. Negative for back pain and myalgias.  Skin:  Negative for itching and rash.  Neurological:  Negative for dizziness, extremity weakness, headaches and  numbness.  Hematological:  Negative for adenopathy. Does not bruise/bleed easily.  Psychiatric/Behavioral:  Negative for depression. The patient is not nervous/anxious.       PHYSICAL EXAMINATION    BP (!) 144/54 (BP Location: Right Arm)   Pulse 83   Temp (!) 97.5 F (36.4 C) (Temporal)   Resp (!) 24   Wt 169 lb 6.4 oz (76.8 kg)   SpO2 99%   BMI 26.53 kg/m     Physical Exam Constitutional:      General: She is not in acute distress.    Appearance: Normal appearance. She is not toxic-appearing.  HENT:     Head: Normocephalic and atraumatic.     Mouth/Throat:     Mouth:  Mucous membranes are moist.     Pharynx: Oropharynx is clear. No oropharyngeal exudate or posterior oropharyngeal erythema.  Eyes:     General: No scleral icterus. Cardiovascular:     Rate and Rhythm: Normal rate and regular rhythm.     Pulses: Normal pulses.     Heart sounds: Normal heart sounds.  Pulmonary:     Effort: Pulmonary effort is normal.     Breath sounds: Normal breath sounds.  Abdominal:     General: Abdomen is flat. Bowel sounds are normal. There is no distension.     Palpations: Abdomen is soft.     Tenderness: There is no abdominal tenderness.  Musculoskeletal:        General: Swelling (left arm lymphedema) present.     Cervical back: Neck supple.  Lymphadenopathy:     Cervical: No cervical adenopathy.  Skin:    General: Skin is warm and dry.     Findings: No rash.  Neurological:     General: No focal deficit present.     Mental Status: She is alert.  Psychiatric:        Mood and Affect: Mood normal.        Behavior: Behavior normal.     LABORATORY DATA:  CBC    Component Value Date/Time   WBC 5.3 10/12/2023 0904   RBC 4.68 10/12/2023 0904   HGB 11.9 (L) 10/12/2023 0904   HGB 9.9 (L) 06/08/2023 0943   HGB 10.5 (L) 01/20/2017 1057   HCT 36.7 10/12/2023 0904   HCT 25.8 (L) 01/05/2018 0513   HCT 32.8 (L) 01/20/2017 1057   PLT 179 10/12/2023 0904   PLT 189 06/08/2023  0943   PLT 368 01/20/2017 1057   MCV 78.4 (L) 10/12/2023 0904   MCV 77.8 (L) 01/20/2017 1057   MCH 25.4 (L) 10/12/2023 0904   MCHC 32.4 10/12/2023 0904   RDW 16.3 (H) 10/12/2023 0904   RDW 16.0 (H) 01/20/2017 1057   LYMPHSABS 1.7 10/12/2023 0904   LYMPHSABS 1.3 01/20/2017 1057   MONOABS 0.4 10/12/2023 0904   MONOABS 0.4 01/20/2017 1057   EOSABS 0.2 10/12/2023 0904   EOSABS 0.1 01/20/2017 1057   BASOSABS 0.0 10/12/2023 0904   BASOSABS 0.1 01/20/2017 1057    CMP     Component Value Date/Time   NA 139 10/12/2023 0904   NA 139 01/20/2017 1057   K 3.8 10/12/2023 0904   K 3.9 01/20/2017 1057   CL 107 10/12/2023 0904   CO2 26 10/12/2023 0904   CO2 25 01/20/2017 1057   GLUCOSE 134 (H) 10/12/2023 0904   GLUCOSE 135 01/20/2017 1057   BUN 14 10/12/2023 0904   BUN 15.6 01/20/2017 1057   CREATININE 0.66 10/12/2023 0904   CREATININE 0.7 01/20/2017 1057   CALCIUM 8.7 (L) 10/12/2023 0904   CALCIUM 9.3 01/20/2017 1057   PROT 7.0 10/12/2023 0904   PROT 7.1 01/20/2017 1057   ALBUMIN 3.9 10/12/2023 0904   ALBUMIN 3.4 (L) 01/20/2017 1057   AST 18 10/12/2023 0904   AST 10 01/20/2017 1057   ALT 10 10/12/2023 0904   ALT 14 01/20/2017 1057   ALKPHOS 71 10/12/2023 0904   ALKPHOS 69 01/20/2017 1057   BILITOT 0.6 10/12/2023 0904   BILITOT 0.43 01/20/2017 1057   GFRNONAA >60 10/12/2023 0904   GFRAA >60 07/14/2019 0538     ASSESSMENT and THERAPY PLAN:   Assessment and Plan Assessment & Plan Breast cancer, metastatic with non regional LN on tamoxifen  and neratinib . Breast  cancer is stable with current oral therapy, including tamoxifen . Recent CT scan shows no changes since May. She tolerates therapy well without significant side effects. Current regimen maintains disease stability and quality of life. - Continue current oral therapy regimen . - Schedule next follow-up appointment in September. - Plan for a CT scan at the end of September or early October to monitor disease  status.  Anosmia and ageusia Persistent loss of taste and smell, possibly related to previous infusion therapy. No history of COVID-19 infection. - Consult with ENT specialists regarding anosmia and ageusia. - Follow up with her regarding potential interventions.  Constipation Constipation managed with herbal remedies. - Continue current regimen for constipation management.   All questions were answered. The patient knows to call the clinic with any problems, questions or concerns. We can certainly see the patient much sooner if necessary.  Total encounter time:30 minutes*in face-to-face visit time, chart review, lab review, care coordination, order entry, and documentation of the encounter time.  *Total Encounter Time as defined by the Centers for Medicare and Medicaid Services includes, in addition to the face-to-face time of a patient visit (documented in the note above) non-face-to-face time: obtaining and reviewing outside history, ordering and reviewing medications, tests or procedures, care coordination (communications with other health care professionals or caregivers) and documentation in the medical record.

## 2023-11-11 ENCOUNTER — Telehealth: Payer: Self-pay | Admitting: Hematology and Oncology

## 2023-11-11 NOTE — Telephone Encounter (Signed)
 Left patient a vm regarding upcoming appointment

## 2023-11-22 ENCOUNTER — Other Ambulatory Visit: Payer: Self-pay | Admitting: Hematology and Oncology

## 2023-11-23 ENCOUNTER — Encounter: Payer: Self-pay | Admitting: Hematology and Oncology

## 2023-11-26 ENCOUNTER — Other Ambulatory Visit: Payer: Self-pay | Admitting: Hematology and Oncology

## 2023-11-26 MED ORDER — NERATINIB MALEATE 40 MG PO TABS: 120.0000 mg | ORAL_TABLET | Freq: Every day | ORAL | 0 refills | Status: DC

## 2023-11-30 NOTE — Therapy (Unsigned)
 OUTPATIENT PHYSICAL THERAPY LOWER EXTREMITY EVALUATION   Patient Name: Tami Schmidt MRN: [id number] DOB:[birth year], 89 y.o., female Today's Date: 12/01/2023  END OF SESSION:  PT End of Session - 12/01/23 1321     Visit Number 1    Number of Visits --    Date for PT Re-Evaluation 01/26/24    Authorization Type UHC    Authorization Time Period U[employer] Medicare- auth requested    Authorization - Number of Visits --    PT Start Time 1105    PT Stop Time 1150    PT Time Calculation (min) 45 min    Activity Tolerance Patient tolerated treatment well    Behavior During Therapy WFL for tasks assessed/performed          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   Diabetes mellitus    fasting 90-100   HTN (hypertension) 03/25/2013   Hx of radiation therapy    breasts hx   Hypercholesterolemia    Hypertension    Lymphedema of arm    left arm   Pulmonary embolism (HCC)    Type II or unspecified type diabetes mellitus without mention of complication, not stated as uncontrolled 03/25/2013   Past Surgical History:  Procedure Laterality Date   ABDOMINAL HYSTERECTOMY     ANKLE SURGERY Right 1995   APPENDECTOMY     BIOPSY  01/07/2018   Procedure: BIOPSY;  Surgeon: Saintclair Jasper, MD;  Location: [facility];  Service: Gastroenterology;;   BREAST SURGERY Bilateral    mastectomy   COLONOSCOPY WITH PROPOFOL  N/A 01/07/2018   Procedure: COLONOSCOPY WITH PROPOFOL ;  Surgeon: Saintclair Jasper, MD;  Location: [facility];  Service: Gastroenterology;  Laterality: N/A;   ESOPHAGOGASTRODUODENOSCOPY (EGD) WITH PROPOFOL  N/A 01/06/2018   Procedure: ESOPHAGOGASTRODUODENOSCOPY (EGD) WITH PROPOFOL ;  Surgeon: Saintclair Jasper, MD;  Location: [facility];  Service: Gastroenterology;  Laterality: N/A;   HERNIA REPAIR  04-26-2010   IR IMAGING GUIDED PORT INSERTION  10/29/2021    MASS EXCISION Left 01/26/2013   Procedure: EXCISION LEFT CHEST WALL MASS AND LEFT ABDOMNAL WALL MASS;  Surgeon: Vicenta DELENA Poli, MD;  Location: [facility];  Service: General;  Laterality: Left;   MASS EXCISION Left 09/20/2014   Procedure: EXCISION OF LEFT CHEST WALL MASS;  Surgeon: Vicenta Poli, MD;  Location: [facility];  Service: General;  Laterality: Left;   TEE WITHOUT CARDIOVERSION N/A 12/08/2017   Procedure: TRANSESOPHAGEAL ECHOCARDIOGRAM (TEE);  Surgeon: Pietro Redell RAMAN, MD;  Location: [facility];  Service: Cardiovascular;  Laterality: N/A;   TOTAL HIP ARTHROPLASTY Right 01/05/2017   TOTAL HIP ARTHROPLASTY Right 01/05/2017   Procedure: TOTAL HIP ARTHROPLASTY ANTERIOR APPROACH;  Surgeon: Liam Lerner, MD;  Location: [facility];  Service: Orthopedics;  Laterality: Right;   Patient Active Problem List   Diagnosis Date Noted   Acute lower GI bleeding 01/07/2023   Rectal bleeding 01/05/2023   Acute GI bleeding 10/04/2022   Port-A-Cath in place 06/12/2022   Loss of perception for taste 03/17/2022   Primary osteoarthritis involving multiple joints 03/17/2022   Genetic testing 07/24/2021   Microcytosis 04/29/2021   Hypokalemia 04/28/2021   Pulmonary embolism (HCC)    Agnosia 08/09/2020   Hardening of the aorta (main artery of the heart) (HCC) 08/09/2020   Morbid obesity (HCC) 08/09/2020   Neuropathy 08/09/2020   Primary  osteoarthritis 08/09/2020   Sensorineural hearing loss (SNHL) of both ears 08/07/2020   Anosmia 06/05/2020   Tinnitus, bilateral 06/05/2020   GI bleed 07/13/2019   ABLA (acute blood loss anemia) 07/12/2019   Microcytic anemia 01/05/2018   Melena 01/04/2018   Lower GI bleed 01/04/2018   Diabetes mellitus type II, controlled, with no complications (HCC) 12/05/2017   Tenosynovitis of left wrist 12/02/2017   Infection of left wrist (HCC) 11/30/2017   Primary osteoarthritis of right hip 01/05/2017   Osteoarthritis of right hip 01/03/2017   Lymphedema of  upper extremity 01/17/2014   Osteopenia 01/17/2014   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   Dilated pore of [place]iner 08/02/2013   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 08/02/2013   Scar 08/02/2013   Malignant neoplasm of lower-inner quadrant of left breast in female, estrogen receptor positive (HCC) 06/16/2013   Type II or unspecified type diabetes mellitus without mention of complication, not stated as uncontrolled 03/25/2013   Essential hypertension 03/25/2013   Chest wall recurrence of breast cancer (HCC) 02/21/2013   Abdominal wall mass 01/14/2013    PCP: Following with cancer center   REFERRING PROVIDER: Joane Birmingham, MD  REFERRING DIAG: Chronic pain of both shoulders, Chronic pain of left knee, Balance problem   THERAPY DIAG:  Left knee pain, unspecified chronicity - Plan: PT plan of care cert/re-cert  Primary osteoarthritis of shoulders, bilateral - Plan: PT plan of care cert/re-cert  Other abnormalities of gait and mobility - Plan: PT plan of care cert/re-cert  Muscle weakness (generalized) - Plan: PT plan of care cert/re-cert  Rationale for Evaluation and Treatment: Rehabilitation  ONSET DATE: 3-4 years ago  SUBJECTIVE:   SUBJECTIVE STATEMENT: Pt reports having inability to walk up and down stairs with both legs alternating. She would like to be able to navigate her 16 steps at home independently, and to increase bilateral LE strength.   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 Pts husband has wrapped before and she has a night garment  PAIN:  Are you having pain? Yes: Pain location: bil shoulders Pain description: ache, didn't rate the pain Aggravating factors: lifting arms to side or forward  Relieving factors: not moving it  PRECAUTIONS: None  RED FLAGS: None   WEIGHT BEARING RESTRICTIONS: No  FALLS:  Has patient fallen in last 6 months? Yes. Number of falls 1- PT will address falls in PT treatment   LIVING ENVIRONMENT: Lives with: lives with their family and lives with their spouse Lives in: House/apartment Stairs: Yes: Internal: 16 steps; on right going up Has following equipment at home: Single point cane  OCCUPATION: Retired, husband is the Financial trader, read   PLOF: Independent  PATIENT GOALS: Walk up the steps with both feet, strengthen both knees  NEXT MD VISIT: Sees Oncologist in 1 month  OBJECTIVE:  Note: Objective measures were completed at Evaluation unless  otherwise noted.  DIAGNOSTIC FINDINGS:   PATIENT SURVEYS:  LEFS  Extreme difficulty/unable (0), Quite a bit of difficulty (1), Moderate difficulty (2), Little difficulty (3), No difficulty (4) Survey date:  12/01/23  Any of your usual work, housework or school activities 3  2. Usual hobbies, recreational or sporting activities 3  3. Getting into/out of the bath 3  4. Walking between rooms 3  5. Putting on socks/shoes 3  6. Squatting  0  7. Lifting an object, like a bag of groceries from the floor 2  8. Performing light activities around your home 3  9. Performing heavy activities around your home 1  10. Getting into/out of a car 4  11. Walking 2 blocks 3  12. Walking 1 mile 0  13. Going up/down 10 stairs (1 flight) 3  14. Standing for 1 hour 2  15.   sitting for 1 hour 4  16. Running on even ground 0  17. Running on uneven ground 0  18. Making sharp turns while running fast 0  19. Hopping  0  20. Rolling over in bed 3  Score total:  40/80     COGNITION: Overall cognitive status: Within functional limits for tasks assessed     SENSATION: Not tested Neuropathy in bilateral feet at night , why pt takes gabapentin     MUSCLE LENGTH: Hamstrings: Right side tightness; Left side tightness   POSTURE: rounded shoulders, forward head, and flexed trunk   PALPATION: NA  UPPER EXTREMITY ROM: Shoulder strength: 4/5 shoulder flexion and extension, abduction 4-/5, IR/ER 3+/5 tested in neutral Shoulder mobility: limited 60% in bilateral shoul flexion/Abd/ER/IR with pain and weakness reported   LOWER EXTREMITY ROM:  Active ROM Right eval Left eval  Hip flexion WNL WNL  Hip extension Dec ROM Dec Rom  Hip abduction Dec ROM Dec ROM  Hip adduction    Hip internal rotation Dec ROM Dec ROM  Hip external rotation Dec ROM Dec ROM  Knee flexion Dec ROM Dec ROM  Knee extension Dec ROM Dec ROM  Ankle dorsiflexion    Ankle plantarflexion    Ankle inversion    Ankle eversion     (Blank rows = not tested)  LOWER EXTREMITY MMT:  MMT Right eval Left eval  Hip flexion 4- 3+  Hip extension 4- 3+  Hip abduction 4- 3+  Hip adduction    Hip internal rotation 4- 4-  Hip external rotation 4- 4-  Knee flexion 4 3+  Knee extension 4- 3+  Ankle dorsiflexion    Ankle plantarflexion    Ankle inversion    Ankle eversion     (Blank rows = not tested)  LOWER EXTREMITY SPECIAL TESTS:    FUNCTIONAL TESTS:  8/19/255 times sit to stand: 15.27 sec used UE support  Timed up and go (TUG): 19.27 no cane; 19.00 sec with cane  3 minute walk test: not complete  GAIT: Distance walked: 300 ft  Assistive device utilized: Single point cane Level of assistance: Min A, genu varus and antalgic gait. Comments: Pt walked from wait room across ortho gym  to private room and back  TREATMENT DATE:   12/01/23  Findings from evaluation discussed, pt educated on plan of care, HEP initiated.   Heel raises/toe raises in seated x10 Hip ER with red band x10 Quad set x10 in seated with leg extended TUG Sit to stands   PATIENT EDUCATION:  Education details: [email or web address] Person educated: Patient Education method: Programmer, multimedia, Demonstration, Actor cues, Verbal cues, and Handouts Education comprehension: verbalized understanding, returned demonstration, and verbal cues required  HOME EXERCISE PROGRAM: Access Code: [email or web address] URL: [email or web address] Date: 12/01/2023 Prepared by: Burnard  Exercises - Seated Hip Abduction with Resistance  - 2-3 x daily - 7 x weekly - 1-2 sets - 10 reps - Seated Quad Set  - 2-3 x daily - 7 x weekly - 1-2 sets - 5 reps - 5 hold - Seated Heel Toe Raises  - 3 x daily - 7 x weekly - 2 sets - 10 reps  ASSESSMENT:  CLINICAL IMPRESSION: Patient is a 89  y.o. female who was seen today for physical therapy evaluation and treatment for bilateral shoulder pain/immobility and left knee pain. However upon patient request, she prefers treating her LE weakness so she can navigate stairs independently without having to do a 'step-to' pattern. Although pt demonstrates UE deficits including bilateral shoulder decreased mobility and strength, pt wants to focus on increasing functional capacity in LE. Pt states she uses a 'Grabber stick' at home whenever she needs to pick an object up from the floor, etc. Pt states she has a husband who can pick things up for her, and perform household tasks she doesn't have to complete. Pt is at falls risk based on functional tests and LEFS is 40/80.  Pt uses a standard cane for all distances with antalgic and slow gait pattern. Pt reports wearing a shoe wedge to  compensate for hip replacement surgery she had. Pt will benefit from continuing skilled PT by addressing the deficits below and moving closer to her goals.   OBJECTIVE IMPAIRMENTS: Abnormal gait, cardiopulmonary status limiting activity, decreased activity tolerance, decreased balance, decreased coordination, decreased endurance, decreased knowledge of condition, decreased mobility, difficulty walking, decreased ROM, decreased strength, impaired sensation, impaired UE functional use, improper body mechanics, postural dysfunction, and pain.   ACTIVITY LIMITATIONS: carrying, lifting, bending, standing, squatting, stairs, transfers, toileting, reach over head, and caring for others  PARTICIPATION LIMITATIONS: meal prep, cleaning, laundry, and yard work  PERSONAL FACTORS: Age, Fitness, Social background, Time since onset of injury/illness/exacerbation, and 3+ comorbidities:    REHAB POTENTIAL: Good  CLINICAL DECISION MAKING: Evolving/moderate complexity  EVALUATION COMPLEXITY: Moderate   GOALS: Goals reviewed with patient? Yes  SHORT TERM GOALS: Target date: 12/25/23 Pt will be independent with initial HEP so she can maintain PT gains and increase functional capacity.  Baseline: Goal status: INITIAL  2.  Pt will complete  3 min walk test to document her baseline.  Baseline:  Goal status: INITIAL     LONG TERM GOALS: Target date: 01/22/24  Pt will be independent with advanced HEP so she can maintain PT gains and increase functional capacity.  Baseline:  Goal status: INITIAL  2.  Pt will perform 5 Sit to stands in < or = to 11 seconds or less without UE support so she is able to perform transfers independently.  Baseline: 15.27 sec Goal status: INITIAL  3.  Pt will score 4+/5 in all LE motions so she increases hip stability for improved functional capacity for navigating stairs.  Baseline:  Goal status: INITIAL  4.  Pt will  complete Single limb stance for > or = to 10+ seconds  bilaterally so she demonstrates improved balance for car transfers, and long standing.  Baseline:  Goal status: INITIAL  5.  Pt will complete TUG in less than or equal to 14 sec so she improves balance and coordination so she can go down stairs safely.  Baseline: 19 sec  Goal status: INITIAL    PLAN:  PT FREQUENCY: 2x/week  PT DURATION: 8 weeks  PLANNED INTERVENTIONS: 97164- PT Re-evaluation, 97750- Physical Performance Testing, 97110-Therapeutic exercises, 97530- Therapeutic activity, [id number]- Neuromuscular re-education, 97535- Self Care, [id number]- Manual therapy, [id number]- Gait training, [phone]- Orthotic/Prosthetic subsequent, [phone]- Canalith repositioning, [id number]- Aquatic Therapy, [phone]- Electrical stimulation (manual), [id number]- Ultrasound, [id number]- Traction (mechanical), [id number]- Ionotophoresis 4mg /ml Dexamethasone , 02981- Parrafin, Patient/Family education, Balance training, Stair training, Taping, Joint mobilization, Spinal mobilization, Manual lymph drainage, Scar mobilization, Compression bandaging, Vestibular training, DME instructions, Wheelchair mobility training, Cryotherapy, and Moist heat  PLAN FOR NEXT SESSION: LE strengthening, flexibility, assess/update HEP, balance tasks   Lavanda Cleverly, SPT 12/01/23 2:19 PM I agree with the following treatment note after reviewing documentation. This session was performed under the supervision of a licensed clinician. Burnard Joy, PT 12/01/23 2:19 PM   Burnard Joy, PT 12/01/23 2:19 PM

## 2023-12-01 ENCOUNTER — Ambulatory Visit: Attending: Family Medicine

## 2023-12-01 ENCOUNTER — Other Ambulatory Visit: Payer: Self-pay

## 2023-12-01 DIAGNOSIS — M25512 Pain in left shoulder: Secondary | ICD-10-CM | POA: Insufficient documentation

## 2023-12-01 DIAGNOSIS — G8929 Other chronic pain: Secondary | ICD-10-CM | POA: Insufficient documentation

## 2023-12-01 DIAGNOSIS — M25511 Pain in right shoulder: Secondary | ICD-10-CM | POA: Insufficient documentation

## 2023-12-01 DIAGNOSIS — M19011 Primary osteoarthritis, right shoulder: Secondary | ICD-10-CM | POA: Diagnosis not present

## 2023-12-01 DIAGNOSIS — M19012 Primary osteoarthritis, left shoulder: Secondary | ICD-10-CM | POA: Diagnosis not present

## 2023-12-01 DIAGNOSIS — M25562 Pain in left knee: Secondary | ICD-10-CM | POA: Insufficient documentation

## 2023-12-01 DIAGNOSIS — M6281 Muscle weakness (generalized): Secondary | ICD-10-CM | POA: Insufficient documentation

## 2023-12-01 DIAGNOSIS — R2689 Other abnormalities of gait and mobility: Secondary | ICD-10-CM | POA: Diagnosis not present

## 2023-12-02 NOTE — Therapy (Unsigned)
 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   Diabetes mellitus    fasting 90-100   HTN (hypertension) 03/25/2013   Hx of radiation therapy    breasts hx   Hypercholesterolemia    Hypertension    Lymphedema of arm    left arm   Pulmonary embolism (HCC)    Type II or unspecified type diabetes mellitus without mention of complication, not stated as uncontrolled 03/25/2013   Past Surgical History:  Procedure Laterality Date   ABDOMINAL HYSTERECTOMY     ANKLE SURGERY Right 1995   APPENDECTOMY     BIOPSY  01/07/2018   Procedure: BIOPSY;  Surgeon: Saintclair Jasper, MD;  Location: [facility];  Service: Gastroenterology;;   BREAST SURGERY Bilateral    mastectomy   COLONOSCOPY WITH PROPOFOL  N/A 01/07/2018   Procedure: COLONOSCOPY WITH PROPOFOL ;  Surgeon: Saintclair Jasper, MD;  Location: [facility];  Service: Gastroenterology;  Laterality: N/A;   ESOPHAGOGASTRODUODENOSCOPY (EGD) WITH PROPOFOL  N/A 01/06/2018   Procedure: ESOPHAGOGASTRODUODENOSCOPY (EGD) WITH PROPOFOL ;  Surgeon: Saintclair Jasper, MD;  Location: [facility];  Service: Gastroenterology;  Laterality: N/A;   HERNIA REPAIR  04-26-2010   IR IMAGING GUIDED PORT INSERTION  10/29/2021   MASS EXCISION Left 01/26/2013   Procedure: EXCISION LEFT CHEST WALL MASS AND LEFT ABDOMNAL WALL MASS;  Surgeon: Vicenta DELENA Poli, MD;  Location: [facility];  Service: General;  Laterality: Left;   MASS EXCISION Left 09/20/2014   Procedure: EXCISION OF LEFT CHEST WALL MASS;  Surgeon: Vicenta Poli, MD;  Location: [facility];  Service: General;  Laterality: Left;   TEE WITHOUT CARDIOVERSION N/A 12/08/2017   Procedure: TRANSESOPHAGEAL  ECHOCARDIOGRAM (TEE);  Surgeon: Pietro Redell RAMAN, MD;  Location: [facility];  Service: Cardiovascular;  Laterality: N/A;   TOTAL HIP ARTHROPLASTY Right 01/05/2017   TOTAL HIP ARTHROPLASTY Right 01/05/2017   Procedure: TOTAL HIP ARTHROPLASTY ANTERIOR APPROACH;  Surgeon: Liam Lerner, MD;  Location: [facility];  Service: Orthopedics;  Laterality: Right;   Patient Active Problem List   Diagnosis Date Noted   Acute lower GI bleeding 01/07/2023   Rectal bleeding 01/05/2023   Acute GI bleeding 10/04/2022   Port-A-Cath in place 06/12/2022   Loss of perception for taste 03/17/2022   Primary osteoarthritis involving multiple joints 03/17/2022   Genetic testing 07/24/2021   Microcytosis 04/29/2021   Hypokalemia 04/28/2021   Pulmonary embolism (HCC)    Agnosia 08/09/2020   Hardening of the aorta (main artery of the heart) (HCC) 08/09/2020   Morbid obesity (HCC) 08/09/2020   Neuropathy 08/09/2020   Primary osteoarthritis 08/09/2020   Sensorineural hearing loss (SNHL) of both ears 08/07/2020   Anosmia 06/05/2020   Tinnitus, bilateral 06/05/2020   GI bleed 07/13/2019   ABLA (acute blood loss anemia) 07/12/2019   Microcytic anemia 01/05/2018   Melena 01/04/2018   Lower GI bleed 01/04/2018   Diabetes mellitus type II, controlled, with no complications (HCC) 12/05/2017   Tenosynovitis of left wrist 12/02/2017   Infection of left wrist (HCC) 11/30/2017   Primary osteoarthritis of right hip 01/05/2017   Osteoarthritis of right hip 01/03/2017   Lymphedema of upper extremity 01/17/2014   Osteopenia 01/17/2014  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   Dilated pore of Winer 08/02/2013   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 08/02/2013   Scar 08/02/2013   Malignant neoplasm of lower-inner quadrant of left breast in female, estrogen receptor positive (HCC) 06/16/2013   Type II or unspecified type diabetes mellitus without mention of complication, not stated as  uncontrolled 03/25/2013   Essential hypertension 03/25/2013   Chest wall recurrence of breast cancer (HCC) 02/21/2013   Abdominal wall mass 01/14/2013    PCP: Following with cancer center   REFERRING PROVIDER: Joane Birmingham, MD  REFERRING DIAG: Chronic pain of both shoulders, Chronic pain of left knee, Balance problem   THERAPY DIAG:  No diagnosis found.  Rationale for Evaluation and Treatment: Rehabilitation  ONSET DATE: 3-4 years ago  SUBJECTIVE:   SUBJECTIVE STATEMENT: Pt reports having inability to walk up and down stairs with both legs alternating. She would like to be able to navigate her 16 steps at home independently, and to increase bilateral LE strength.   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 Pts husband has wrapped before and she has a night garment  PAIN:  Are you having pain? Yes: Pain location: bil shoulders Pain description: ache, didn't rate the pain Aggravating factors: lifting arms to side or forward  Relieving factors: not moving it  PRECAUTIONS: None  RED FLAGS: None   WEIGHT BEARING RESTRICTIONS: No  FALLS:  Has patient fallen in last 6 months? Yes. Number of falls 1- PT will address falls in PT treatment   LIVING ENVIRONMENT: Lives with: lives with their family and lives with  their spouse Lives in: House/apartment Stairs: Yes: Internal: 16 steps; on right going up Has following equipment at home: Single point cane  OCCUPATION: Retired, husband is the Financial trader, read   PLOF: Independent  PATIENT GOALS: Walk up the steps with both feet, strengthen both knees  NEXT MD VISIT: Sees Oncologist in 1 month  OBJECTIVE:  Note: Objective measures were completed at Evaluation unless otherwise noted.  DIAGNOSTIC FINDINGS:   PATIENT SURVEYS:  LEFS  Extreme difficulty/unable (0), Quite a bit of difficulty (1), Moderate difficulty (2), Little difficulty (3), No difficulty (4) Survey date:  12/01/23  Any of your usual work, housework or school activities 3  2. Usual hobbies, recreational or sporting activities 3  3. Getting into/out of the bath 3  4. Walking between rooms 3  5. Putting on socks/shoes 3  6. Squatting  0  7. Lifting an object, like a bag of groceries from the floor 2  8. Performing light activities around your home 3  9. Performing heavy activities around your home 1  10. Getting into/out of a car 4  11. Walking 2 blocks 3  12. Walking 1 mile 0  13. Going up/down 10 stairs (1 flight) 3  14. Standing for 1 hour 2  15.  sitting  for 1 hour 4  16. Running on even ground 0  17. Running on uneven ground 0  18. Making sharp turns while running fast 0  19. Hopping  0  20. Rolling over in bed 3  Score total:  40/80     COGNITION: Overall cognitive status: Within functional limits for tasks assessed     SENSATION: Not tested Neuropathy in bilateral feet at night , why pt takes gabapentin     MUSCLE LENGTH: Hamstrings: Right side tightness; Left side tightness   POSTURE: rounded shoulders, forward head, and flexed trunk   PALPATION: NA  UPPER EXTREMITY ROM: Shoulder strength: 4/5 shoulder flexion and extension, abduction 4-/5, IR/ER 3+/5 tested in neutral Shoulder mobility: limited 60% in bilateral shoul flexion/Abd/ER/IR with pain and  weakness reported   LOWER EXTREMITY ROM:  Active ROM Right eval Left eval  Hip flexion WNL WNL  Hip extension Dec ROM Dec Rom  Hip abduction Dec ROM Dec ROM  Hip adduction    Hip internal rotation Dec ROM Dec ROM  Hip external rotation Dec ROM Dec ROM  Knee flexion Dec ROM Dec ROM  Knee extension Dec ROM Dec ROM  Ankle dorsiflexion    Ankle plantarflexion    Ankle inversion    Ankle eversion     (Blank rows = not tested)  LOWER EXTREMITY MMT:  MMT Right eval Left eval  Hip flexion 4- 3+  Hip extension 4- 3+  Hip abduction 4- 3+  Hip adduction    Hip internal rotation 4- 4-  Hip external rotation 4- 4-  Knee flexion 4 3+  Knee extension 4- 3+  Ankle dorsiflexion    Ankle plantarflexion    Ankle inversion    Ankle eversion     (Blank rows = not tested)  LOWER EXTREMITY SPECIAL TESTS:    FUNCTIONAL TESTS:  8/19/255 times sit to stand: 15.27 sec used UE support  Timed up and go (TUG): 19.27 no cane; 19.00 sec with cane  3 minute walk test: not complete  GAIT: Distance walked: 300 ft  Assistive device utilized: Single point cane Level of assistance: Min A, genu varus and antalgic gait. Comments: Pt walked from wait room across ortho gym to private room and back                                                                                                                                TREATMENT DATE:   12/01/23  Findings from evaluation discussed, pt educated on plan of care, HEP initiated.   Heel raises/toe raises in seated x10 Hip ER with red band x10 Quad set x10 in seated with leg extended TUG Sit to stands   PATIENT EDUCATION:  Education details: [email or web address] Person educated: Patient Education method: Programmer, multimedia, Demonstration, Actor cues, Verbal cues, and Handouts Education comprehension: verbalized understanding, returned demonstration, and verbal cues required  HOME EXERCISE PROGRAM: Access Code: [email or web address] URL:  [email or web address] Date: 12/01/2023 Prepared by:  Kelly  Exercises - Seated Hip Abduction with Resistance  - 2-3 x daily - 7 x weekly - 1-2 sets - 10 reps - Seated Quad Set  - 2-3 x daily - 7 x weekly - 1-2 sets - 5 reps - 5 hold - Seated Heel Toe Raises  - 3 x daily - 7 x weekly - 2 sets - 10 reps  ASSESSMENT:  CLINICAL IMPRESSION: Patient is a 89  y.o. female who was seen today for physical therapy evaluation and treatment for bilateral shoulder pain/immobility and left knee pain. However upon patient request, she prefers treating her LE weakness so she can navigate stairs independently without having to do a 'step-to' pattern. Although pt demonstrates UE deficits including bilateral shoulder decreased mobility and strength, pt wants to focus on increasing functional capacity in LE. Pt states she uses a 'Grabber stick' at home whenever she needs to pick an object up from the floor, etc. Pt states she has a husband who can pick things up for her, and perform household tasks she doesn't have to complete. Pt is at falls risk based on functional tests and LEFS is 40/80.  Pt uses a standard cane for all distances with antalgic and slow gait pattern. Pt reports wearing a shoe wedge to compensate for hip replacement surgery she had. Pt will benefit from continuing skilled PT by addressing the deficits below and moving closer to her goals.   OBJECTIVE IMPAIRMENTS: Abnormal gait, cardiopulmonary status limiting activity, decreased activity tolerance, decreased balance, decreased coordination, decreased endurance, decreased knowledge of condition, decreased mobility, difficulty walking, decreased ROM, decreased strength, impaired sensation, impaired UE functional use, improper body mechanics, postural dysfunction, and pain.   ACTIVITY LIMITATIONS: carrying, lifting, bending, standing, squatting, stairs, transfers, toileting, reach over head, and caring for others  PARTICIPATION LIMITATIONS:  meal prep, cleaning, laundry, and yard work  PERSONAL FACTORS: Age, Fitness, Social background, Time since onset of injury/illness/exacerbation, and 3+ comorbidities:    REHAB POTENTIAL: Good  CLINICAL DECISION MAKING: Evolving/moderate complexity  EVALUATION COMPLEXITY: Moderate   GOALS: Goals reviewed with patient? Yes  SHORT TERM GOALS: Target date: 12/25/23 Pt will be independent with initial HEP so she can maintain PT gains and increase functional capacity.  Baseline: Goal status: INITIAL  2.  Pt will complete  3 min walk test to document her baseline.  Baseline:  Goal status: INITIAL     LONG TERM GOALS: Target date: 01/22/24  Pt will be independent with advanced HEP so she can maintain PT gains and increase functional capacity.  Baseline:  Goal status: INITIAL  2.  Pt will perform 5 Sit to stands in < or = to 11 seconds or less without UE support so she is able to perform transfers independently.  Baseline: 15.27 sec Goal status: INITIAL  3.  Pt will score 4+/5 in all LE motions so she increases hip stability for improved functional capacity for navigating stairs.  Baseline:  Goal status: INITIAL  4.  Pt will complete Single limb stance for > or = to 10+ seconds bilaterally so she demonstrates improved balance for car transfers, and long standing.  Baseline:  Goal status: INITIAL  5.  Pt will complete TUG in less than or equal to 14 sec so she improves balance and coordination so she can go down stairs safely.  Baseline: 19 sec  Goal status: INITIAL    PLAN:  PT FREQUENCY: 2x/week  PT DURATION: 8 weeks  PLANNED INTERVENTIONS: 02835- PT Re-evaluation, 97750- Physical Performance Testing,  97110-Therapeutic exercises, 97530- Therapeutic activity, [id number]- Neuromuscular re-education, [phone]- Self Care, 02859- Manual therapy, [id number]- Gait training, [phone]- Orthotic/Prosthetic subsequent, [phone]- Canalith repositioning, [id number]- Aquatic Therapy, [phone]- Electrical  stimulation (manual), [id number]- Ultrasound, [id number]- Traction (mechanical), [id number]- Ionotophoresis 4mg /ml Dexamethasone , 02981- Parrafin, Patient/Family education, Balance training, Stair training, Taping, Joint mobilization, Spinal mobilization, Manual lymph drainage, Scar mobilization, Compression bandaging, Vestibular training, DME instructions, Wheelchair mobility training, Cryotherapy, and Moist heat  PLAN FOR NEXT SESSION: LE strengthening, flexibility, assess/update HEP, balance tasks   Lavanda Cleverly, SPT 12/02/23 7:55 PM I agree with the following treatment note after reviewing documentation. This session was performed under the supervision of a licensed clinician. Burnard Joy, PT 12/02/23 7:55 PM   Burnard Joy, PT 12/02/23 7:55 PM

## 2023-12-03 ENCOUNTER — Ambulatory Visit: Admitting: Physical Therapy

## 2023-12-03 ENCOUNTER — Encounter: Admitting: Physical Therapy

## 2023-12-03 DIAGNOSIS — M6281 Muscle weakness (generalized): Secondary | ICD-10-CM

## 2023-12-03 DIAGNOSIS — M19011 Primary osteoarthritis, right shoulder: Secondary | ICD-10-CM

## 2023-12-03 DIAGNOSIS — M25562 Pain in left knee: Secondary | ICD-10-CM

## 2023-12-03 DIAGNOSIS — R2689 Other abnormalities of gait and mobility: Secondary | ICD-10-CM

## 2023-12-03 DIAGNOSIS — G8929 Other chronic pain: Secondary | ICD-10-CM | POA: Diagnosis not present

## 2023-12-03 NOTE — Therapy (Signed)
 OUTPATIENT PHYSICAL THERAPY LOWER EXTREMITY Treatment   Patient Name: Tami Schmidt MRN: [id number] DOB:Feb 10, 1933, 89 y.o., female Today's Date: 12/03/2023  END OF SESSION:  PT End of Session - 12/03/23 1023     Visit Number 2    Date for PT Re-Evaluation 01/26/24    Authorization Type UHC    Authorization Time Period [facility] Medicare- auth requested - still pending 8/21    PT Start Time 1016    PT Stop Time 1057    PT Time Calculation (min) 41 min    Activity Tolerance Patient tolerated treatment well    Behavior During Therapy WFL for tasks assessed/performed          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   Diabetes mellitus    fasting 90-100   HTN (hypertension) 03/25/2013   Hx of radiation therapy    breasts hx   Hypercholesterolemia    Hypertension    Lymphedema of arm    left arm   Pulmonary embolism (HCC)    Type II or unspecified type diabetes mellitus without mention of complication, not stated as uncontrolled 03/25/2013   Past Surgical History:  Procedure Laterality Date   ABDOMINAL HYSTERECTOMY     ANKLE SURGERY Right 1995   APPENDECTOMY     BIOPSY  01/07/2018   Procedure: BIOPSY;  Surgeon: Saintclair Jasper, MD;  Location: [facility];  Service: Gastroenterology;;   BREAST SURGERY Bilateral    mastectomy   COLONOSCOPY WITH PROPOFOL  N/A 01/07/2018   Procedure: COLONOSCOPY WITH PROPOFOL ;  Surgeon: Saintclair Jasper, MD;  Location: [facility];  Service: Gastroenterology;  Laterality: N/A;   ESOPHAGOGASTRODUODENOSCOPY (EGD) WITH PROPOFOL  N/A 01/06/2018   Procedure: ESOPHAGOGASTRODUODENOSCOPY (EGD) WITH PROPOFOL ;  Surgeon: Saintclair Jasper, MD;  Location: [facility];  Service: Gastroenterology;  Laterality: N/A;   HERNIA REPAIR  04-26-2010   IR IMAGING GUIDED PORT INSERTION  10/29/2021   MASS EXCISION Left 01/26/2013   Procedure:  EXCISION LEFT CHEST WALL MASS AND LEFT ABDOMNAL WALL MASS;  Surgeon: Vicenta DELENA Poli, MD;  Location: [facility];  Service: General;  Laterality: Left;   MASS EXCISION Left 09/20/2014   Procedure: EXCISION OF LEFT CHEST WALL MASS;  Surgeon: Vicenta Poli, MD;  Location: [facility];  Service: General;  Laterality: Left;   TEE WITHOUT CARDIOVERSION N/A 12/08/2017   Procedure: TRANSESOPHAGEAL ECHOCARDIOGRAM (TEE);  Surgeon: Pietro Redell RAMAN, MD;  Location: [facility];  Service: Cardiovascular;  Laterality: N/A;   TOTAL HIP ARTHROPLASTY Right 01/05/2017   TOTAL HIP ARTHROPLASTY Right 01/05/2017   Procedure: TOTAL HIP ARTHROPLASTY ANTERIOR APPROACH;  Surgeon: Liam Lerner, MD;  Location: [facility];  Service: Orthopedics;  Laterality: Right;   Patient Active Problem List   Diagnosis Date Noted   Acute lower GI bleeding 01/07/2023   Rectal bleeding 01/05/2023   Acute GI bleeding 10/04/2022   Port-A-Cath in place 06/12/2022   Loss of perception for taste 03/17/2022   Primary osteoarthritis involving multiple joints 03/17/2022   Genetic testing 07/24/2021   Microcytosis 04/29/2021   Hypokalemia 04/28/2021   Pulmonary embolism (HCC)    Agnosia 08/09/2020   Hardening of the aorta (main artery of the heart) (HCC) 08/09/2020   Morbid obesity (HCC) 08/09/2020   Neuropathy 08/09/2020   Primary osteoarthritis 08/09/2020   Sensorineural hearing loss (SNHL) of both ears 08/07/2020  Anosmia 06/05/2020   Tinnitus, bilateral 06/05/2020   GI bleed 07/13/2019   ABLA (acute blood loss anemia) 07/12/2019   Microcytic anemia 01/05/2018   Melena 01/04/2018   Lower GI bleed 01/04/2018   Diabetes mellitus type II, controlled, with no complications (HCC) 12/05/2017   Tenosynovitis of left wrist 12/02/2017   Infection of left wrist (HCC) 11/30/2017   Primary osteoarthritis of right hip 01/05/2017   Osteoarthritis of right hip 01/03/2017   Lymphedema of upper extremity 01/17/2014   Osteopenia  01/17/2014   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   Dilated pore of [place]iner 08/02/2013   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 08/02/2013   Scar 08/02/2013   Malignant neoplasm of lower-inner quadrant of left breast in female, estrogen receptor positive (HCC) 06/16/2013   Type II or unspecified type diabetes mellitus without mention of complication, not stated as uncontrolled 03/25/2013   Essential hypertension 03/25/2013   Chest wall recurrence of breast cancer (HCC) 02/21/2013   Abdominal wall mass 01/14/2013    PCP: Following with cancer center   REFERRING PROVIDER: Joane Birmingham, MD  REFERRING DIAG: Chronic pain of both shoulders, Chronic pain of left knee, Balance problem   THERAPY DIAG:  Left knee pain, unspecified chronicity  Primary osteoarthritis of shoulders, bilateral  Other abnormalities of gait and mobility  Muscle weakness (generalized)  Rationale for Evaluation and Treatment: Rehabilitation  ONSET DATE: 3-4 years ago  SUBJECTIVE:   SUBJECTIVE STATEMENT: Pt reports having inability to walk up and down stairs with both legs alternating. She would like to be able to navigate her 16 steps at home independently, and to increase bilateral LE strength.   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 Pts husband has wrapped before and she has a night garment  PAIN:  Are you having pain? Yes: Pain location: bil shoulders Pain description: ache, didn't rate the pain Aggravating factors: lifting arms to side or forward  Relieving factors: not moving it  PRECAUTIONS: None  RED FLAGS: None   WEIGHT BEARING RESTRICTIONS: No  FALLS:  Has patient fallen in last 6 months? Yes. Number of falls 1- PT will address falls in PT treatment   LIVING ENVIRONMENT: Lives with: lives with their family and lives with their spouse Lives in: House/apartment Stairs: Yes: Internal: 16 steps; on right going up Has following equipment at home: Single point cane  OCCUPATION: Retired, husband is the Financial trader, read   PLOF: Independent  PATIENT GOALS: Walk up the steps with both feet, strengthen both knees  NEXT MD VISIT: Sees Oncologist in 1 month  OBJECTIVE:  Note: Objective measures were completed at Evaluation unless otherwise noted.  DIAGNOSTIC FINDINGS:   PATIENT SURVEYS:  LEFS  Extreme difficulty/unable (0), Quite a bit of difficulty (1), Moderate difficulty (2), Little difficulty (3), No difficulty (4) Survey date:  12/01/23  Any of your usual work, housework or  school activities 3  2. Usual hobbies, recreational or sporting activities 3  3. Getting into/out of the bath 3  4. Walking between rooms 3  5. Putting on socks/shoes 3  6. Squatting  0  7. Lifting an object, like a bag of groceries from the floor 2  8. Performing light activities around your home 3  9. Performing heavy activities around your home 1  10. Getting into/out of a car 4  11. Walking 2 blocks 3  12. Walking 1 mile 0  13. Going up/down 10 stairs (1 flight) 3  14. Standing for 1 hour 2  15.  sitting for 1 hour 4  16. Running on even ground 0  17. Running on uneven ground 0  18. Making sharp turns while running fast 0  19. Hopping  0  20. Rolling over in bed 3  Score total:   40/80     COGNITION: Overall cognitive status: Within functional limits for tasks assessed     SENSATION: Not tested Neuropathy in bilateral feet at night , why pt takes gabapentin     MUSCLE LENGTH: Hamstrings: Right side tightness; Left side tightness   POSTURE: rounded shoulders, forward head, and flexed trunk   PALPATION: NA  UPPER EXTREMITY ROM: Shoulder strength: 4/5 shoulder flexion and extension, abduction 4-/5, IR/ER 3+/5 tested in neutral Shoulder mobility: limited 60% in bilateral shoul flexion/Abd/ER/IR with pain and weakness reported   LOWER EXTREMITY ROM:  Active ROM Right eval Left eval  Hip flexion WNL WNL  Hip extension Dec ROM Dec Rom  Hip abduction Dec ROM Dec ROM  Hip adduction    Hip internal rotation Dec ROM Dec ROM  Hip external rotation Dec ROM Dec ROM  Knee flexion Dec ROM Dec ROM  Knee extension Dec ROM Dec ROM  Ankle dorsiflexion    Ankle plantarflexion    Ankle inversion    Ankle eversion     (Blank rows = not tested)  LOWER EXTREMITY MMT:  MMT Right eval Left eval  Hip flexion 4- 3+  Hip extension 4- 3+  Hip abduction 4- 3+  Hip adduction    Hip internal rotation 4- 4-  Hip external rotation 4- 4-  Knee flexion 4 3+  Knee extension 4- 3+  Ankle dorsiflexion    Ankle plantarflexion    Ankle inversion    Ankle eversion     (Blank rows = not tested)  LOWER EXTREMITY SPECIAL TESTS:    FUNCTIONAL TESTS:  8/19/255 times sit to stand: 15.27 sec used UE support  Timed up and go (TUG): 19.27 no cane; 19.00 sec with cane  3 minute walk test: not complete  GAIT: Distance walked: 300 ft  Assistive device utilized: Single point cane Level of assistance: Min A, genu varus and antalgic gait. Comments: Pt walked from wait room across ortho gym to private room and back  TREATMENT DATE:   12/01/23   Findings from evaluation discussed, pt educated on plan of care, HEP initiated.   Heel raises/toe raises in seated x10 Hip ER with red band x10 Quad set x10 in seated with leg extended TUG Sit to stands   12/03/23: Nustep - 6 mins L1 for reciprocal pattern in LE to better achieve stairs Hamstring stretch sitting EOM with strap 2x30s Seated shoulder retractions x10 > yellow band bil shoulder horizontal abduction x10 2x10 pelvic tilts seated - max cues but reports she feels much looser Seated hip abduction red loop 2x10 4x5 sit to stands with UE and purple pad to stand - mod cues for technique > high fear of falling and unable to stand with bil UE at counter height and needs one hand at chair seat to stand Gait training with SPC for 750' moderate verbal cues and tactile cues for posture, head carriage, decreased cadence to eliminate Lt leg limp with good effect and decreased Rt ER to neutral.  Seated wide step outs 2# x10  PATIENT EDUCATION:  Education details: [email or web address] Person educated: Patient Education method: Explanation, Demonstration, Tactile cues, Verbal cues, and Handouts Education comprehension: verbalized understanding, returned demonstration, and verbal cues required  HOME EXERCISE PROGRAM: Access Code: [email or web address] URL: [email or web address] Date: 12/01/2023 Prepared by: Burnard  Exercises - Seated Hip Abduction with Resistance  - 2-3 x daily - 7 x weekly - 1-2 sets - 10 reps - Seated Quad Set  - 2-3 x daily - 7 x weekly - 1-2 sets - 5 reps - 5 hold - Seated Heel Toe Raises  - 3 x daily - 7 x weekly - 2 sets - 10 reps  ASSESSMENT:  CLINICAL IMPRESSION: Patient is a 89  y.o. female who was seen today for physical therapy treatment for bilateral shoulder pain/immobility and left knee pain. Pt tolerated treatment well, does require max cues throughout for posture, gait mechanics, and techniques for exercises. Demonstrated weakness in core, back and hips but denied  pain during session. Pt will benefit from continuing skilled PT by addressing the deficits below and moving closer to her goals.   OBJECTIVE IMPAIRMENTS: Abnormal gait, cardiopulmonary status limiting activity, decreased activity tolerance, decreased balance, decreased coordination, decreased endurance, decreased knowledge of condition, decreased mobility, difficulty walking, decreased ROM, decreased strength, impaired sensation, impaired UE functional use, improper body mechanics, postural dysfunction, and pain.   ACTIVITY LIMITATIONS: carrying, lifting, bending, standing, squatting, stairs, transfers, toileting, reach over head, and caring for others  PARTICIPATION LIMITATIONS: meal prep, cleaning, laundry, and yard work  PERSONAL FACTORS: Age, Fitness, Social background, Time since onset of injury/illness/exacerbation, and 3+ comorbidities:    REHAB POTENTIAL: Good  CLINICAL DECISION MAKING: Evolving/moderate complexity  EVALUATION COMPLEXITY: Moderate   GOALS: Goals reviewed with patient? Yes  SHORT TERM GOALS: Target date: 12/25/23 Pt will be independent with initial HEP so she can maintain PT gains and increase functional capacity.  Baseline: Goal status: INITIAL  2.  Pt will complete  3 min walk test to document her baseline.  Baseline:  Goal status: INITIAL     LONG TERM GOALS: Target date: 01/22/24  Pt will be independent with advanced HEP so she can maintain PT gains and increase functional capacity.  Baseline:  Goal status: INITIAL  2.  Pt will perform 5 Sit to stands in < or = to 11 seconds or less without UE support so she is able to perform transfers independently.  Baseline: 15.27 sec Goal status: INITIAL  3.  Pt will score 4+/5 in all LE motions so she increases hip stability for improved functional capacity for navigating stairs.  Baseline:  Goal status: INITIAL  4.  Pt will complete Single limb stance for > or = to 10+ seconds bilaterally so she  demonstrates improved balance for car transfers, and long standing.  Baseline:  Goal status: INITIAL  5.  Pt will complete TUG in less than or equal to 14 sec so she improves balance and coordination so she can go down stairs safely.  Baseline: 19 sec  Goal status: INITIAL    PLAN:  PT FREQUENCY: 2x/week  PT DURATION: 8 weeks  PLANNED INTERVENTIONS: 97164- PT Re-evaluation, 97750- Physical Performance Testing, 97110-Therapeutic exercises, 97530- Therapeutic activity, [id number]- Neuromuscular re-education, 97535- Self Care, 02859- Manual therapy, [id number]- Gait training, [phone]- Orthotic/Prosthetic subsequent, [phone]- Canalith repositioning, [id number]- Aquatic Therapy, [phone]- Electrical stimulation (manual), [id number]- Ultrasound, [id number]- Traction (mechanical), [id number]- Ionotophoresis 4mg /ml Dexamethasone , 02981- Parrafin, Patient/Family education, Balance training, Stair training, Taping, Joint mobilization, Spinal mobilization, Manual lymph drainage, Scar mobilization, Compression bandaging, Vestibular training, DME instructions, Wheelchair mobility training, Cryotherapy, and Moist heat  PLAN FOR NEXT SESSION: LE strengthening, flexibility, assess/update HEP, balance tasks Darryle Navy, PT, DPT 12/02/2509:35 AM  [facility] [address] [place], [place] [address] Phone # [phone] Fax [phone]

## 2023-12-04 ENCOUNTER — Encounter: Payer: Self-pay | Admitting: Hematology and Oncology

## 2023-12-07 ENCOUNTER — Ambulatory Visit

## 2023-12-07 DIAGNOSIS — R2689 Other abnormalities of gait and mobility: Secondary | ICD-10-CM

## 2023-12-07 DIAGNOSIS — G8929 Other chronic pain: Secondary | ICD-10-CM | POA: Diagnosis not present

## 2023-12-07 DIAGNOSIS — M25562 Pain in left knee: Secondary | ICD-10-CM

## 2023-12-07 DIAGNOSIS — M6281 Muscle weakness (generalized): Secondary | ICD-10-CM

## 2023-12-07 DIAGNOSIS — M19011 Primary osteoarthritis, right shoulder: Secondary | ICD-10-CM

## 2023-12-07 NOTE — Therapy (Signed)
 OUTPATIENT PHYSICAL THERAPY LOWER EXTREMITY Treatment   Patient Name: Tami Schmidt MRN: [id number] DOB:[birth year], 89 y.o., female Today's Date: 12/07/2023  END OF SESSION:  PT End of Session - 12/07/23 1232     Visit Number 3    Date for PT Re-Evaluation 01/26/24    Authorization Type UHC    Authorization Time Period [facility] Medicare- auth requested - still pending 8/25    PT Start Time 1156   late   PT Stop Time 1230    PT Time Calculation (min) 34 min    Activity Tolerance Patient tolerated treatment well    Behavior During Therapy WFL for tasks assessed/performed           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   Diabetes mellitus    fasting 90-100   HTN (hypertension) 03/25/2013   Hx of radiation therapy    breasts hx   Hypercholesterolemia    Hypertension    Lymphedema of arm    left arm   Pulmonary embolism (HCC)    Type II or unspecified type diabetes mellitus without mention of complication, not stated as uncontrolled 03/25/2013   Past Surgical History:  Procedure Laterality Date   ABDOMINAL HYSTERECTOMY     ANKLE SURGERY Right 1995   APPENDECTOMY     BIOPSY  01/07/2018   Procedure: BIOPSY;  Surgeon: Saintclair Jasper, MD;  Location: [facility];  Service: Gastroenterology;;   BREAST SURGERY Bilateral    mastectomy   COLONOSCOPY WITH PROPOFOL  N/A 01/07/2018   Procedure: COLONOSCOPY WITH PROPOFOL ;  Surgeon: Saintclair Jasper, MD;  Location: [facility];  Service: Gastroenterology;  Laterality: N/A;   ESOPHAGOGASTRODUODENOSCOPY (EGD) WITH PROPOFOL  N/A 01/06/2018   Procedure: ESOPHAGOGASTRODUODENOSCOPY (EGD) WITH PROPOFOL ;  Surgeon: Saintclair Jasper, MD;  Location: [facility];  Service: Gastroenterology;  Laterality: N/A;   HERNIA REPAIR  04-26-2010   IR IMAGING GUIDED PORT INSERTION  10/29/2021   MASS EXCISION Left 01/26/2013    Procedure: EXCISION LEFT CHEST WALL MASS AND LEFT ABDOMNAL WALL MASS;  Surgeon: Vicenta DELENA Poli, MD;  Location: [facility];  Service: General;  Laterality: Left;   MASS EXCISION Left 09/20/2014   Procedure: EXCISION OF LEFT CHEST WALL MASS;  Surgeon: Vicenta Poli, MD;  Location: [facility];  Service: General;  Laterality: Left;   TEE WITHOUT CARDIOVERSION N/A 12/08/2017   Procedure: TRANSESOPHAGEAL ECHOCARDIOGRAM (TEE);  Surgeon: Pietro Redell RAMAN, MD;  Location: [facility];  Service: Cardiovascular;  Laterality: N/A;   TOTAL HIP ARTHROPLASTY Right 01/05/2017   TOTAL HIP ARTHROPLASTY Right 01/05/2017   Procedure: TOTAL HIP ARTHROPLASTY ANTERIOR APPROACH;  Surgeon: Liam Lerner, MD;  Location: [facility];  Service: Orthopedics;  Laterality: Right;   Patient Active Problem List   Diagnosis Date Noted   Acute lower GI bleeding 01/07/2023   Rectal bleeding 01/05/2023   Acute GI bleeding 10/04/2022   Port-A-Cath in place 06/12/2022   Loss of perception for taste 03/17/2022   Primary osteoarthritis involving multiple joints 03/17/2022   Genetic testing 07/24/2021   Microcytosis 04/29/2021   Hypokalemia 04/28/2021   Pulmonary embolism (HCC)    Agnosia 08/09/2020   Hardening of the aorta (main artery of the heart) (HCC) 08/09/2020   Morbid obesity (HCC) 08/09/2020   Neuropathy 08/09/2020   Primary osteoarthritis 08/09/2020   Sensorineural hearing loss (SNHL) of  both ears 08/07/2020   Anosmia 06/05/2020   Tinnitus, bilateral 06/05/2020   GI bleed 07/13/2019   ABLA (acute blood loss anemia) 07/12/2019   Microcytic anemia 01/05/2018   Melena 01/04/2018   Lower GI bleed 01/04/2018   Diabetes mellitus type II, controlled, with no complications (HCC) 12/05/2017   Tenosynovitis of left wrist 12/02/2017   Infection of left wrist (HCC) 11/30/2017   Primary osteoarthritis of right hip 01/05/2017   Osteoarthritis of right hip 01/03/2017   Lymphedema of upper extremity 01/17/2014    Osteopenia 01/17/2014   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   Dilated pore of Winer 08/02/2013   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 08/02/2013   Scar 08/02/2013   Malignant neoplasm of lower-inner quadrant of left breast in female, estrogen receptor positive (HCC) 06/16/2013   Type II or unspecified type diabetes mellitus without mention of complication, not stated as uncontrolled 03/25/2013   Essential hypertension 03/25/2013   Chest wall recurrence of breast cancer (HCC) 02/21/2013   Abdominal wall mass 01/14/2013    PCP: Following with cancer center   REFERRING PROVIDER: Joane Birmingham, MD  REFERRING DIAG: Chronic pain of both shoulders, Chronic pain of left knee, Balance problem   THERAPY DIAG:  Left knee pain, unspecified chronicity  Primary osteoarthritis of shoulders, bilateral  Other abnormalities of gait and mobility  Muscle weakness (generalized)  Rationale for Evaluation and Treatment: Rehabilitation  ONSET DATE: 3-4 years ago  SUBJECTIVE:   SUBJECTIVE STATEMENT: It takes me longer to drive myself here so a little late today.    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 Pts husband has wrapped before and she has a night garment  PAIN:  Are you having pain?  Yes: Pain location: bil shoulders Pain description: ache, didn't rate the pain Aggravating factors: lifting arms to side or forward  Relieving factors: not moving it  PRECAUTIONS: None  RED FLAGS: None   WEIGHT BEARING RESTRICTIONS: No  FALLS:  Has patient fallen in last 6 months? Yes. Number of falls 1- PT will address falls in PT treatment   LIVING ENVIRONMENT: Lives with: lives with their family and lives with their spouse Lives in: House/apartment Stairs: Yes: Internal: 16 steps; on right going up Has following equipment at home: Single point cane  OCCUPATION: Retired, husband is the Financial trader, read   PLOF: Independent  PATIENT GOALS: Walk up the steps with both feet, strengthen both knees  NEXT MD VISIT: Sees Oncologist in 1 month  OBJECTIVE:  Note: Objective measures were completed at Evaluation unless otherwise noted.  DIAGNOSTIC FINDINGS:   PATIENT SURVEYS:  LEFS  Extreme difficulty/unable (0), Quite a bit of difficulty (1), Moderate difficulty (2), Little difficulty (3), No difficulty (4) Survey date:  12/01/23  Any of your usual work, housework or school activities 3  2. Usual hobbies, recreational or sporting activities 3  3. Getting  into/out of the bath 3  4. Walking between rooms 3  5. Putting on socks/shoes 3  6. Squatting  0  7. Lifting an object, like a bag of groceries from the floor 2  8. Performing light activities around your home 3  9. Performing heavy activities around your home 1  10. Getting into/out of a car 4  11. Walking 2 blocks 3  12. Walking 1 mile 0  13. Going up/down 10 stairs (1 flight) 3  14. Standing for 1 hour 2  15.  sitting for 1 hour 4  16. Running on even ground 0  17. Running on uneven ground 0  18. Making sharp turns while running fast 0  19. Hopping  0  20. Rolling over in bed 3  Score total:  40/80     COGNITION: Overall cognitive status: Within functional limits for tasks assessed     SENSATION: Not  tested Neuropathy in bilateral feet at night , why pt takes gabapentin     MUSCLE LENGTH: Hamstrings: Right side tightness; Left side tightness   POSTURE: rounded shoulders, forward head, and flexed trunk   PALPATION: NA  UPPER EXTREMITY ROM: Shoulder strength: 4/5 shoulder flexion and extension, abduction 4-/5, IR/ER 3+/5 tested in neutral Shoulder mobility: limited 60% in bilateral shoul flexion/Abd/ER/IR with pain and weakness reported   LOWER EXTREMITY ROM:  Active ROM Right eval Left eval  Hip flexion WNL WNL  Hip extension Dec ROM Dec Rom  Hip abduction Dec ROM Dec ROM  Hip adduction    Hip internal rotation Dec ROM Dec ROM  Hip external rotation Dec ROM Dec ROM  Knee flexion Dec ROM Dec ROM  Knee extension Dec ROM Dec ROM  Ankle dorsiflexion    Ankle plantarflexion    Ankle inversion    Ankle eversion     (Blank rows = not tested)  LOWER EXTREMITY MMT:  MMT Right eval Left eval  Hip flexion 4- 3+  Hip extension 4- 3+  Hip abduction 4- 3+  Hip adduction    Hip internal rotation 4- 4-  Hip external rotation 4- 4-  Knee flexion 4 3+  Knee extension 4- 3+  Ankle dorsiflexion    Ankle plantarflexion    Ankle inversion    Ankle eversion     (Blank rows = not tested)  LOWER EXTREMITY SPECIAL TESTS:    FUNCTIONAL TESTS:  8/19/255 times sit to stand: 15.27 sec used UE support  Timed up and go (TUG): 19.27 no cane; 19.00 sec with cane  3 minute walk test: not complete  GAIT: Distance walked: 300 ft  Assistive device utilized: Single point cane Level of assistance: Min A, genu varus and antalgic gait. Comments: Pt walked from wait room across ortho gym to private room and back  TREATMENT DATE:   12/07/23: Nustep - 6 mins L5 for reciprocal pattern in LE to better achieve stairs- PT present to discuss progress  Hamstring  stretch sitting edge of chair 2x30s Seated shoulder retractions 2x10 yellow band  2x10 pelvic tilts seated - max cues but reports she feels much looser Seated hip adduction with ball: 5 hold x20 4x5 sit to stands with UE and purple pad to stand - mod cues for technique Standing marching x20 with bil UE support  Sidestepping at barre: 4 laps  Seated hamstring curls: yellow band x12 each    12/01/23  Findings from evaluation discussed, pt educated on plan of care, HEP initiated.   Heel raises/toe raises in seated x10 Hip ER with red band x10 Quad set x10 in seated with leg extended TUG Sit to stands   12/03/23: Nustep - 6 mins L1 for reciprocal pattern in LE to better achieve stairs Hamstring stretch sitting EOM with strap 2x30s Seated shoulder retractions x10 > yellow band bil shoulder horizontal abduction x10 2x10 pelvic tilts seated - max cues but reports she feels much looser Seated hip abduction red loop 2x10 4x5 sit to stands with UE and purple pad to stand - mod cues for technique > high fear of falling and unable to stand with bil UE at counter height and needs one hand at chair seat to stand Gait training with SPC for 750' moderate verbal cues and tactile cues for posture, head carriage, decreased cadence to eliminate Lt leg limp with good effect and decreased Rt ER to neutral.  Seated wide step outs 2# x10  PATIENT EDUCATION:  Education details: [email or web address] Person educated: Patient Education method: Explanation, Demonstration, Tactile cues, Verbal cues, and Handouts Education comprehension: verbalized understanding, returned demonstration, and verbal cues required  HOME EXERCISE PROGRAM: Access Code: [email or web address] URL: [email or web address] Date: 12/07/2023 Prepared by: Burnard  Exercises - Seated Hip Abduction with Resistance  - 2-3 x daily - 7 x weekly - 1-2 sets - 10 reps - Seated Quad Set  - 2-3 x daily - 7 x weekly - 1-2 sets - 5 reps - 5 hold - Seated Heel  Toe Raises  - 3 x daily - 7 x weekly - 2 sets - 10 reps - Sit to Stand with Armchair  - 2 x daily - 7 x weekly - 2 sets - 5 reps - Seated Long Arc Quad  - 2 x daily - 7 x weekly - 1 sets - 10 reps - Standing March with Counter Support  - 2 x daily - 7 x weekly - 10 reps - Seated Hip Adduction Isometrics with Ball  - 2 x daily - 7 x weekly - 1 sets - 10 reps - 5 hold ASSESSMENT:  CLINICAL IMPRESSION: Pt is independent in initial HEP and PT added to to today to include some functional mobility.  Pt is motivated and requires short rest breaks between exercises due to fatigue.  PT monitored for safety and technique.  Patient will benefit from skilled PT to address the below impairments and improve overall function.   OBJECTIVE IMPAIRMENTS: Abnormal gait, cardiopulmonary status limiting activity, decreased activity tolerance, decreased balance, decreased coordination, decreased endurance, decreased knowledge of condition, decreased mobility, difficulty walking, decreased ROM, decreased strength, impaired sensation, impaired UE functional use, improper body mechanics, postural dysfunction, and pain.   ACTIVITY LIMITATIONS: carrying, lifting, bending, standing, squatting, stairs, transfers, toileting, reach over head, and caring for others  PARTICIPATION LIMITATIONS: meal prep, cleaning, laundry, and yard work  PERSONAL FACTORS: Age, Fitness, Social background, Time since onset of injury/illness/exacerbation, and 3+ comorbidities:    REHAB POTENTIAL: Good  CLINICAL DECISION MAKING: Evolving/moderate complexity  EVALUATION COMPLEXITY: Moderate   GOALS: Goals reviewed with patient? Yes  SHORT TERM GOALS: Target date: 12/25/23 Pt will be independent with initial HEP so she can maintain PT gains and increase functional capacity.  Baseline: Goal status: INITIAL  2.  Pt will complete  3 min walk test to document her baseline.  Baseline:  Goal status: INITIAL     LONG TERM GOALS: Target  date: 01/22/24  Pt will be independent with advanced HEP so she can maintain PT gains and increase functional capacity.  Baseline:  Goal status: INITIAL  2.  Pt will perform 5 Sit to stands in < or = to 11 seconds or less without UE support so she is able to perform transfers independently.  Baseline: 15.27 sec Goal status: INITIAL  3.  Pt will score 4+/5 in all LE motions so she increases hip stability for improved functional capacity for navigating stairs.  Baseline:  Goal status: INITIAL  4.  Pt will complete Single limb stance for > or = to 10+ seconds bilaterally so she demonstrates improved balance for car transfers, and long standing.  Baseline:  Goal status: INITIAL  5.  Pt will complete TUG in less than or equal to 14 sec so she improves balance and coordination so she can go down stairs safely.  Baseline: 19 sec  Goal status: INITIAL    PLAN:  PT FREQUENCY: 2x/week  PT DURATION: 8 weeks  PLANNED INTERVENTIONS: 97164- PT Re-evaluation, 97750- Physical Performance Testing, 97110-Therapeutic exercises, 97530- Therapeutic activity, [id number]- Neuromuscular re-education, 97535- Self Care, [id number]- Manual therapy, [id number]- Gait training, [phone]- Orthotic/Prosthetic subsequent, [phone]- Canalith repositioning, [id number]- Aquatic Therapy, [phone]- Electrical stimulation (manual), [id number]- Ultrasound, [id number]- Traction (mechanical), [id number]- Ionotophoresis 4mg /ml Dexamethasone , 02981- Parrafin, Patient/Family education, Balance training, Stair training, Taping, Joint mobilization, Spinal mobilization, Manual lymph drainage, Scar mobilization, Compression bandaging, Vestibular training, DME instructions, Wheelchair mobility training, Cryotherapy, and Moist heat  PLAN FOR NEXT SESSION: LE strengthening, flexibility, assess/update HEP, balance tasks  Burnard Joy, PT 12/07/23 12:34 PM   [facility] [address] [place], [place] [address] Phone # [phone] Fax  [phone]

## 2023-12-09 NOTE — Therapy (Signed)
 OUTPATIENT PHYSICAL THERAPY LOWER EXTREMITY Treatment   Patient Name: ELENOR WILDES MRN: [id number] DOB:07-06-1932, 89 y.o., female Today's Date: 12/10/2023  END OF SESSION:  PT End of Session - 12/10/23 1023     Visit Number 4    Number of Visits 18    Date for PT Re-Evaluation 01/26/24    Authorization Type UHC    Authorization Time Period [facility] Medicare- auth requested - still pending 8/25    PT Start Time 1019    PT Stop Time 1100    PT Time Calculation (min) 41 min    Activity Tolerance Patient tolerated treatment well    Behavior During Therapy WFL for tasks assessed/performed            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   Diabetes mellitus    fasting 90-100   HTN (hypertension) 03/25/2013   Hx of radiation therapy    breasts hx   Hypercholesterolemia    Hypertension    Lymphedema of arm    left arm   Pulmonary embolism (HCC)    Type II or unspecified type diabetes mellitus without mention of complication, not stated as uncontrolled 03/25/2013   Past Surgical History:  Procedure Laterality Date   ABDOMINAL HYSTERECTOMY     ANKLE SURGERY Right 1995   APPENDECTOMY     BIOPSY  01/07/2018   Procedure: BIOPSY;  Surgeon: Saintclair Jasper, MD;  Location: [facility];  Service: Gastroenterology;;   BREAST SURGERY Bilateral    mastectomy   COLONOSCOPY WITH PROPOFOL  N/A 01/07/2018   Procedure: COLONOSCOPY WITH PROPOFOL ;  Surgeon: Saintclair Jasper, MD;  Location: [facility];  Service: Gastroenterology;  Laterality: N/A;   ESOPHAGOGASTRODUODENOSCOPY (EGD) WITH PROPOFOL  N/A 01/06/2018   Procedure: ESOPHAGOGASTRODUODENOSCOPY (EGD) WITH PROPOFOL ;  Surgeon: Saintclair Jasper, MD;  Location: [facility];  Service: Gastroenterology;  Laterality: N/A;   HERNIA REPAIR  04-26-2010   IR IMAGING GUIDED PORT INSERTION  10/29/2021   MASS EXCISION Left  01/26/2013   Procedure: EXCISION LEFT CHEST WALL MASS AND LEFT ABDOMNAL WALL MASS;  Surgeon: Vicenta DELENA Poli, MD;  Location: [facility];  Service: General;  Laterality: Left;   MASS EXCISION Left 09/20/2014   Procedure: EXCISION OF LEFT CHEST WALL MASS;  Surgeon: Vicenta Poli, MD;  Location: [facility];  Service: General;  Laterality: Left;   TEE WITHOUT CARDIOVERSION N/A 12/08/2017   Procedure: TRANSESOPHAGEAL ECHOCARDIOGRAM (TEE);  Surgeon: Pietro Redell RAMAN, MD;  Location: [facility];  Service: Cardiovascular;  Laterality: N/A;   TOTAL HIP ARTHROPLASTY Right 01/05/2017   TOTAL HIP ARTHROPLASTY Right 01/05/2017   Procedure: TOTAL HIP ARTHROPLASTY ANTERIOR APPROACH;  Surgeon: Liam Lerner, MD;  Location: [facility];  Service: Orthopedics;  Laterality: Right;   Patient Active Problem List   Diagnosis Date Noted   Acute lower GI bleeding 01/07/2023   Rectal bleeding 01/05/2023   Acute GI bleeding 10/04/2022   Port-A-Cath in place 06/12/2022   Loss of perception for taste 03/17/2022   Primary osteoarthritis involving multiple joints 03/17/2022   Genetic testing 07/24/2021   Microcytosis 04/29/2021   Hypokalemia 04/28/2021   Pulmonary embolism (HCC)    Agnosia 08/09/2020   Hardening of the aorta (main artery of the heart) (HCC) 08/09/2020   Morbid obesity (HCC) 08/09/2020   Neuropathy 08/09/2020   Primary osteoarthritis 08/09/2020  Sensorineural hearing loss (SNHL) of both ears 08/07/2020   Anosmia 06/05/2020   Tinnitus, bilateral 06/05/2020   GI bleed 07/13/2019   ABLA (acute blood loss anemia) 07/12/2019   Microcytic anemia 01/05/2018   Melena 01/04/2018   Lower GI bleed 01/04/2018   Diabetes mellitus type II, controlled, with no complications (HCC) 12/05/2017   Tenosynovitis of left wrist 12/02/2017   Infection of left wrist (HCC) 11/30/2017   Primary osteoarthritis of right hip 01/05/2017   Osteoarthritis of right hip 01/03/2017   Lymphedema of upper extremity  01/17/2014   Osteopenia 01/17/2014   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   Dilated pore of [place] 08/02/2013   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 08/02/2013   Scar 08/02/2013   Malignant neoplasm of lower-inner quadrant of left breast in female, estrogen receptor positive (HCC) 06/16/2013   Type II or unspecified type diabetes mellitus without mention of complication, not stated as uncontrolled 03/25/2013   Essential hypertension 03/25/2013   Chest wall recurrence of breast cancer (HCC) 02/21/2013   Abdominal wall mass 01/14/2013    PCP: Following with cancer center   REFERRING PROVIDER: Joane Birmingham, MD  REFERRING DIAG: Chronic pain of both shoulders, Chronic pain of left knee, Balance problem   THERAPY DIAG:  Left knee pain, unspecified chronicity  Primary osteoarthritis of shoulders, bilateral  Other abnormalities of gait and mobility  Muscle weakness (generalized)  Rationale for Evaluation and Treatment: Rehabilitation  ONSET DATE: 3-4 years ago  SUBJECTIVE:   SUBJECTIVE STATEMENT: I'm doing well. I do my exercises.   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 Pts husband has wrapped before and she has a night garment  PAIN:  Are you having pain? Yes: Pain  location: bil shoulders Pain description: ache, didn't rate the pain Aggravating factors: lifting arms to side or forward  Relieving factors: not moving it  PRECAUTIONS: None  RED FLAGS: None   WEIGHT BEARING RESTRICTIONS: No  FALLS:  Has patient fallen in last 6 months? Yes. Number of falls 1- PT will address falls in PT treatment   LIVING ENVIRONMENT: Lives with: lives with their family and lives with their spouse Lives in: House/apartment Stairs: Yes: Internal: 16 steps; on right going up Has following equipment at home: Single point cane  OCCUPATION: Retired, husband is the Financial trader, read   PLOF: Independent  PATIENT GOALS: Walk up the steps with both feet, strengthen both knees  NEXT MD VISIT: Sees Oncologist in 1 month  OBJECTIVE:  Note: Objective measures were completed at Evaluation unless otherwise noted.  DIAGNOSTIC FINDINGS:   PATIENT SURVEYS:  LEFS  Extreme difficulty/unable (0), Quite a bit of difficulty (1), Moderate difficulty (2), Little difficulty (3), No difficulty (4) Survey date:  12/01/23  Any of your usual work, housework or school activities 3  2. Usual hobbies, recreational or sporting activities 3  3. Getting into/out of  the bath 3  4. Walking between rooms 3  5. Putting on socks/shoes 3  6. Squatting  0  7. Lifting an object, like a bag of groceries from the floor 2  8. Performing light activities around your home 3  9. Performing heavy activities around your home 1  10. Getting into/out of a car 4  11. Walking 2 blocks 3  12. Walking 1 mile 0  13. Going up/down 10 stairs (1 flight) 3  14. Standing for 1 hour 2  15.  sitting for 1 hour 4  16. Running on even ground 0  17. Running on uneven ground 0  18. Making sharp turns while running fast 0  19. Hopping  0  20. Rolling over in bed 3  Score total:  40/80     COGNITION: Overall cognitive status: Within functional limits for tasks assessed     SENSATION: Not  tested Neuropathy in bilateral feet at night , why pt takes gabapentin     MUSCLE LENGTH: Hamstrings: Right side tightness; Left side tightness   POSTURE: rounded shoulders, forward head, and flexed trunk   PALPATION: NA  UPPER EXTREMITY ROM: Shoulder strength: 4/5 shoulder flexion and extension, abduction 4-/5, IR/ER 3+/5 tested in neutral Shoulder mobility: limited 60% in bilateral shoul flexion/Abd/ER/IR with pain and weakness reported   LOWER EXTREMITY ROM:  Active ROM Right eval Left eval  Hip flexion WNL WNL  Hip extension Dec ROM Dec Rom  Hip abduction Dec ROM Dec ROM  Hip adduction    Hip internal rotation Dec ROM Dec ROM  Hip external rotation Dec ROM Dec ROM  Knee flexion Dec ROM Dec ROM  Knee extension Dec ROM Dec ROM  Ankle dorsiflexion    Ankle plantarflexion    Ankle inversion    Ankle eversion     (Blank rows = not tested)  LOWER EXTREMITY MMT:  MMT Right eval Left eval  Hip flexion 4- 3+  Hip extension 4- 3+  Hip abduction 4- 3+  Hip adduction    Hip internal rotation 4- 4-  Hip external rotation 4- 4-  Knee flexion 4 3+  Knee extension 4- 3+  Ankle dorsiflexion    Ankle plantarflexion    Ankle inversion    Ankle eversion     (Blank rows = not tested)  LOWER EXTREMITY SPECIAL TESTS:    FUNCTIONAL TESTS:  8/19/255 times sit to stand: 15.27 sec used UE support  Timed up and go (TUG): 19.27 no cane; 19.00 sec with cane  3 minute walk test: not complete  GAIT: Distance walked: 300 ft  Assistive device utilized: Single point cane Level of assistance: Min A, genu varus and antalgic gait. Comments: Pt walked from wait room across ortho gym to private room and back  TREATMENT DATE:  12/10/23: Nustep - 7 mins L5 for reciprocal pattern in LE to better achieve stairs- PT present to discuss progress  Hamstring stretch  sitting edge of chair with foot on 6 inch step 2x30s Seated hamstring curls:green band x20 each Seated shoulder retractions 2x10 yellow band  Seated LAQ 3# 2x10 Standing heel raises x 20 Standing marching x20 with bil UE support  Seated hip adduction with ball: 5 hold x20 1x 10, 1x 8  with one UE assist sit to stands and purple pad to stand - mod cues for technique Sidestepping at barre: 4 laps yellow loop at thighs, then at ankles x 4 laps Adjusted cane to correct height   12/07/23: Nustep - 6 mins L5 for reciprocal pattern in LE to better achieve stairs- PT present to discuss progress  Hamstring stretch sitting edge of chair 2x30s Seated shoulder retractions 2x10 yellow band   2x10 pelvic tilts seated - max cues but reports she feels much looser Seated hip adduction with ball: 5 hold x20 4x5 sit to stands with UE and purple pad to stand - mod cues for technique Standing marching x20 with bil UE support  Sidestepping at barre: 4 laps  Seated hamstring curls: yellow band x12 each    12/01/23  Findings from evaluation discussed, pt educated on plan of care, HEP initiated.   Heel raises/toe raises in seated x10 Hip ER with red band x10 Quad set x10 in seated with leg extended TUG Sit to stands   12/03/23: Nustep - 6 mins L1 for reciprocal pattern in LE to better achieve stairs Hamstring stretch sitting EOM with strap 2x30s Seated shoulder retractions x10 > yellow band bil shoulder horizontal abduction x10 2x10 pelvic tilts seated - max cues but reports she feels much looser Seated hip abduction red loop 2x10 4x5 sit to stands with UE and purple pad to stand - mod cues for technique > high fear of falling and unable to stand with bil UE at counter height and needs one hand at chair seat to stand Gait training with SPC for 750' moderate verbal cues and tactile cues for posture, head carriage, decreased cadence to eliminate Lt leg limp with good effect and decreased Rt ER to  neutral.  Seated wide step outs 2# x10  PATIENT EDUCATION:  Education details: [email or web address] Person educated: Patient Education method: Explanation, Demonstration, Tactile cues, Verbal cues, and Handouts Education comprehension: verbalized understanding, returned demonstration, and verbal cues required  HOME EXERCISE PROGRAM: Access Code: [email or web address] URL: [email or web address] Date: 12/07/2023 Prepared by: Burnard  Exercises - Seated Hip Abduction with Resistance  - 2-3 x daily - 7 x weekly - 1-2 sets - 10 reps - Seated Quad Set  - 2-3 x daily - 7 x weekly - 1-2 sets - 5 reps - 5 hold - Seated Heel Toe Raises  - 3 x daily - 7 x weekly - 2 sets - 10 reps - Sit to Stand with Armchair  - 2 x daily - 7 x weekly - 2 sets - 5 reps - Seated Long Arc Quad  - 2 x daily - 7 x weekly - 1 sets - 10 reps - Standing March with Counter Support  - 2 x daily - 7 x weekly - 10 reps - Seated Hip Adduction Isometrics with Ball  - 2 x daily - 7 x weekly - 1 sets - 10 reps - 5 hold ASSESSMENT:  CLINICAL IMPRESSION: Patient was able to progress many exercises with resistance today. She  states the NuStep is most challenging for her. Completed LAQ's in chair to avoid trunk ext. Adjusted cane to correct height after educating pt on the reasoning for it. She continues to demonstrate potential for improvement and would benefit from continued skilled therapy to address impairments.    OBJECTIVE IMPAIRMENTS: Abnormal gait, cardiopulmonary status limiting activity, decreased activity tolerance, decreased balance, decreased coordination, decreased endurance, decreased knowledge of condition, decreased mobility, difficulty walking, decreased ROM, decreased strength, impaired sensation, impaired UE functional use, improper body mechanics, postural dysfunction, and pain.   ACTIVITY LIMITATIONS: carrying, lifting, bending, standing, squatting, stairs, transfers, toileting, reach over head, and caring for  others  PARTICIPATION LIMITATIONS: meal prep, cleaning, laundry, and yard work  PERSONAL FACTORS: Age, Fitness, Social background, Time since onset of injury/illness/exacerbation, and 3+ comorbidities:    REHAB POTENTIAL: Good  CLINICAL DECISION MAKING: Evolving/moderate complexity  EVALUATION COMPLEXITY: Moderate   GOALS: Goals reviewed with patient? Yes  SHORT TERM GOALS: Target date: 12/25/23 Pt will be independent with initial HEP so she can maintain PT gains and increase functional capacity.  Baseline: Goal status: INITIAL  2.  Pt will complete  3 min walk test to document her baseline.  Baseline:  Goal status: INITIAL     LONG TERM GOALS: Target date: 01/22/24  Pt will be independent with advanced HEP so she can maintain PT gains and increase functional capacity.  Baseline:  Goal status: INITIAL  2.  Pt will perform 5 Sit to stands in < or = to 11 seconds or less without UE support so she is able to perform transfers independently.  Baseline: 15.27 sec Goal status: INITIAL  3.  Pt will score 4+/5 in all LE motions so she increases hip stability for improved functional capacity for navigating stairs.  Baseline:  Goal status: INITIAL  4.  Pt will complete Single limb stance for > or = to 10+ seconds bilaterally so she demonstrates improved balance for car transfers, and long standing.  Baseline:  Goal status: INITIAL  5.  Pt will complete TUG in less than or equal to 14 sec so she improves balance and coordination so she can go down stairs safely.  Baseline: 19 sec  Goal status: INITIAL    PLAN:  PT FREQUENCY: 2x/week  PT DURATION: 8 weeks  PLANNED INTERVENTIONS: [id number]- PT Re-evaluation, 97750- Physical Performance Testing, 97110-Therapeutic exercises, 97530- Therapeutic activity, [id number]- Neuromuscular re-education, 97535- Self Care, [id number]- Manual therapy, [phone]- Gait training, [phone]- Orthotic/Prosthetic subsequent, [phone]- Canalith repositioning, [id number]-  Aquatic Therapy, [phone]- Electrical stimulation (manual), [id number]- Ultrasound, [id number]- Traction (mechanical), [id number]- Ionotophoresis 4mg /ml Dexamethasone , 02981- Parrafin, Patient/Family education, Balance training, Stair training, Taping, Joint mobilization, Spinal mobilization, Manual lymph drainage, Scar mobilization, Compression bandaging, Vestibular training, DME instructions, Wheelchair mobility training, Cryotherapy, and Moist heat  PLAN FOR NEXT SESSION: LE strengthening, flexibility, assess/update HEP, balance tasks  Mliss Cummins, PT  12/10/23 12:03 PM   [facility] [address], [address] [place], [place] [address] Phone # [phone] Fax [phone]

## 2023-12-10 ENCOUNTER — Encounter: Payer: Self-pay | Admitting: Physical Therapy

## 2023-12-10 ENCOUNTER — Ambulatory Visit: Admitting: Physical Therapy

## 2023-12-10 DIAGNOSIS — M25562 Pain in left knee: Secondary | ICD-10-CM

## 2023-12-10 DIAGNOSIS — R2689 Other abnormalities of gait and mobility: Secondary | ICD-10-CM

## 2023-12-10 DIAGNOSIS — M19011 Primary osteoarthritis, right shoulder: Secondary | ICD-10-CM

## 2023-12-10 DIAGNOSIS — G8929 Other chronic pain: Secondary | ICD-10-CM | POA: Diagnosis not present

## 2023-12-10 DIAGNOSIS — M6281 Muscle weakness (generalized): Secondary | ICD-10-CM

## 2023-12-14 NOTE — Therapy (Signed)
 OUTPATIENT PHYSICAL THERAPY LOWER EXTREMITY Treatment   Patient Name: Tami Schmidt MRN: [id number] DOB:03-20-1933, 89 y.o., female Today's Date: 12/15/2023  END OF SESSION:  PT End of Session - 12/15/23 0937     Visit Number 5    Number of Visits 18    Date for PT Re-Evaluation 01/26/24    Authorization Type [employer]    Authorization Time Period 8/19 to 10/14    Authorization - Visit Number 5    Authorization - Number of Visits 16    PT Start Time 0935    PT Stop Time 1015    PT Time Calculation (min) 40 min    Activity Tolerance Patient tolerated treatment well    Behavior During Therapy WFL for tasks assessed/performed             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   Diabetes mellitus    fasting 90-100   HTN (hypertension) 03/25/2013   Hx of radiation therapy    breasts hx   Hypercholesterolemia    Hypertension    Lymphedema of arm    left arm   Pulmonary embolism (HCC)    Type II or unspecified type diabetes mellitus without mention of complication, not stated as uncontrolled 03/25/2013   Past Surgical History:  Procedure Laterality Date   ABDOMINAL HYSTERECTOMY     ANKLE SURGERY Right 1995   APPENDECTOMY     BIOPSY  01/07/2018   Procedure: BIOPSY;  Surgeon: Saintclair Jasper, MD;  Location: [facility];  Service: Gastroenterology;;   BREAST SURGERY Bilateral    mastectomy   COLONOSCOPY WITH PROPOFOL  N/A 01/07/2018   Procedure: COLONOSCOPY WITH PROPOFOL ;  Surgeon: Saintclair Jasper, MD;  Location: [facility];  Service: Gastroenterology;  Laterality: N/A;   ESOPHAGOGASTRODUODENOSCOPY (EGD) WITH PROPOFOL  N/A 01/06/2018   Procedure: ESOPHAGOGASTRODUODENOSCOPY (EGD) WITH PROPOFOL ;  Surgeon: Saintclair Jasper, MD;  Location: [facility];  Service: Gastroenterology;  Laterality: N/A;   HERNIA REPAIR  04-26-2010   IR IMAGING GUIDED PORT  INSERTION  10/29/2021   MASS EXCISION Left 01/26/2013   Procedure: EXCISION LEFT CHEST WALL MASS AND LEFT ABDOMNAL WALL MASS;  Surgeon: Vicenta DELENA Poli, MD;  Location: [facility];  Service: General;  Laterality: Left;   MASS EXCISION Left 09/20/2014   Procedure: EXCISION OF LEFT CHEST WALL MASS;  Surgeon: Vicenta Poli, MD;  Location: [facility];  Service: General;  Laterality: Left;   TEE WITHOUT CARDIOVERSION N/A 12/08/2017   Procedure: TRANSESOPHAGEAL ECHOCARDIOGRAM (TEE);  Surgeon: Pietro Redell RAMAN, MD;  Location: [facility];  Service: Cardiovascular;  Laterality: N/A;   TOTAL HIP ARTHROPLASTY Right 01/05/2017   TOTAL HIP ARTHROPLASTY Right 01/05/2017   Procedure: TOTAL HIP ARTHROPLASTY ANTERIOR APPROACH;  Surgeon: Liam Lerner, MD;  Location: [facility];  Service: Orthopedics;  Laterality: Right;   Patient Active Problem List   Diagnosis Date Noted   Acute lower GI bleeding 01/07/2023   Rectal bleeding 01/05/2023   Acute GI bleeding 10/04/2022   Port-A-Cath in place 06/12/2022   Loss of perception for taste 03/17/2022   Primary osteoarthritis involving multiple joints 03/17/2022   Genetic testing 07/24/2021   Microcytosis 04/29/2021   Hypokalemia 04/28/2021   Pulmonary embolism (HCC)    Agnosia 08/09/2020   Hardening of the aorta (main artery of the heart) (HCC) 08/09/2020   Morbid  obesity (HCC) 08/09/2020   Neuropathy 08/09/2020   Primary osteoarthritis 08/09/2020   Sensorineural hearing loss (SNHL) of both ears 08/07/2020   Anosmia 06/05/2020   Tinnitus, bilateral 06/05/2020   GI bleed 07/13/2019   ABLA (acute blood loss anemia) 07/12/2019   Microcytic anemia 01/05/2018   Melena 01/04/2018   Lower GI bleed 01/04/2018   Diabetes mellitus type II, controlled, with no complications (HCC) 12/05/2017   Tenosynovitis of left wrist 12/02/2017   Infection of left wrist (HCC) 11/30/2017   Primary osteoarthritis of right hip 01/05/2017   Osteoarthritis of right hip  01/03/2017   Lymphedema of upper extremity 01/17/2014   Osteopenia 01/17/2014   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   Dilated pore of [place]iner 08/02/2013   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 08/02/2013   Scar 08/02/2013   Malignant neoplasm of lower-inner quadrant of left breast in female, estrogen receptor positive (HCC) 06/16/2013   Type II or unspecified type diabetes mellitus without mention of complication, not stated as uncontrolled 03/25/2013   Essential hypertension 03/25/2013   Chest wall recurrence of breast cancer (HCC) 02/21/2013   Abdominal wall mass 01/14/2013    PCP: Following with cancer center   REFERRING PROVIDER: Joane Birmingham, MD  REFERRING DIAG: Chronic pain of both shoulders, Chronic pain of left knee, Balance problem   THERAPY DIAG:  Left knee pain, unspecified chronicity  Primary osteoarthritis of shoulders, bilateral  Other abnormalities of gait and mobility  Muscle weakness (generalized)  Rationale for Evaluation and Treatment: Rehabilitation  ONSET DATE: 3-4 years ago  SUBJECTIVE:   SUBJECTIVE STATEMENT: I do my leg stretches and my left knee goes gloop like something is sliding in there.   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 Pts husband  has wrapped before and she has a night garment  PAIN:   12/15/23  1/10 pain in L knee Are you having pain? Yes: Pain location: bil shoulders Pain description: ache, didn't rate the pain Aggravating factors: lifting arms to side or forward  Relieving factors: not moving it  PRECAUTIONS: None  RED FLAGS: None   WEIGHT BEARING RESTRICTIONS: No  FALLS:  Has patient fallen in last 6 months? Yes. Number of falls 1- PT will address falls in PT treatment   LIVING ENVIRONMENT: Lives with: lives with their family and lives with their spouse Lives in: House/apartment Stairs: Yes: Internal: 16 steps; on right going up Has following equipment at home: Single point cane  OCCUPATION: Retired, husband is the Financial trader, read   PLOF: Independent  PATIENT GOALS: Walk up the steps with both feet, strengthen both knees  NEXT MD VISIT: Sees Oncologist in 1 month  OBJECTIVE:  Note: Objective measures were completed at Evaluation unless otherwise noted.  DIAGNOSTIC FINDINGS:   PATIENT SURVEYS:  LEFS  Extreme difficulty/unable (0), Quite a bit of difficulty (1), Moderate difficulty (2), Little difficulty (3),  No difficulty (4) Survey date:  12/01/23  Any of your usual work, housework or school activities 3  2. Usual hobbies, recreational or sporting activities 3  3. Getting into/out of the bath 3  4. Walking between rooms 3  5. Putting on socks/shoes 3  6. Squatting  0  7. Lifting an object, like a bag of groceries from the floor 2  8. Performing light activities around your home 3  9. Performing heavy activities around your home 1  10. Getting into/out of a car 4  11. Walking 2 blocks 3  12. Walking 1 mile 0  13. Going up/down 10 stairs (1 flight) 3  14. Standing for 1 hour 2  15.  sitting for 1 hour 4  16. Running on even ground 0  17. Running on uneven ground 0  18. Making sharp turns while running fast 0  19. Hopping  0  20. Rolling over in bed 3  Score total:  40/80      COGNITION: Overall cognitive status: Within functional limits for tasks assessed     SENSATION: Not tested Neuropathy in bilateral feet at night , why pt takes gabapentin     MUSCLE LENGTH: Hamstrings: Right side tightness; Left side tightness   POSTURE: rounded shoulders, forward head, and flexed trunk   PALPATION: NA  UPPER EXTREMITY ROM: Shoulder strength: 4/5 shoulder flexion and extension, abduction 4-/5, IR/ER 3+/5 tested in neutral Shoulder mobility: limited 60% in bilateral shoul flexion/Abd/ER/IR with pain and weakness reported   LOWER EXTREMITY ROM:  Active ROM Right eval Left eval  Hip flexion WNL WNL  Hip extension Dec ROM Dec Rom  Hip abduction Dec ROM Dec ROM  Hip adduction    Hip internal rotation Dec ROM Dec ROM  Hip external rotation Dec ROM Dec ROM  Knee flexion Dec ROM Dec ROM  Knee extension Dec ROM Dec ROM  Ankle dorsiflexion    Ankle plantarflexion    Ankle inversion    Ankle eversion     (Blank rows = not tested)  LOWER EXTREMITY MMT:  MMT Right eval Left eval  Hip flexion 4- 3+  Hip extension 4- 3+  Hip abduction 4- 3+  Hip adduction    Hip internal rotation 4- 4-  Hip external rotation 4- 4-  Knee flexion 4 3+  Knee extension 4- 3+  Ankle dorsiflexion    Ankle plantarflexion    Ankle inversion    Ankle eversion     (Blank rows = not tested)  LOWER EXTREMITY SPECIAL TESTS:    FUNCTIONAL TESTS:  8/19/255 times sit to stand: 15.27 sec used UE support  Timed up and go (TUG): 19.27 no cane; 19.00 sec with cane  3 minute walk test: not complete  GAIT: Distance walked: 300 ft  Assistive device utilized: Single point cane Level of assistance: Min A, genu varus and antalgic gait. Comments: Pt walked from wait room across ortho gym to private room and back  TREATMENT DATE:  12/15/23: Nustep - 6 mins  L5 for reciprocal pattern in LE to better achieve stairs- PT present to discuss progress  1x 10  with one UE assist sit to stands and purple pad to stand, then 1x10 with 2 UE assist - mod cues for technique Step ups fwd on 4 inch 2x10, lateral 1x10, attempted 6 inch but uses UE to pull Seated LAQ 4# 2x10 Standing heel raises 4#  x 20 Standing marching 4#  x20 with bil UE support  Seated hip adduction with ball: 5 hold x20 Standing hip ABD 2# 3x5 B Standing HS curl 2# 2x 10 B Seated HS stretch 2x20 Functional squat partial range toward chair x 10   12/10/23: Nustep - 7 mins L5 for reciprocal pattern in LE to better achieve stairs- PT present to discuss progress  Hamstring stretch sitting edge of chair with foot on 6 inch step 2x30s Seated hamstring curls:green band x20 each Seated shoulder retractions 2x10 yellow band  Seated LAQ 3# 2x10 Standing heel raises x 20 Standing marching x20 with bil UE support  Seated hip adduction with ball: 5 hold x20 1x 10, 1x 8  with one UE assist sit to stands and purple pad to stand - mod cues for technique Sidestepping at barre: 4 laps yellow loop at thighs, then at ankles x 4 laps Adjusted cane to correct height   12/07/23: Nustep - 6 mins L5 for reciprocal pattern in LE to better achieve stairs- PT present to discuss progress  Hamstring stretch sitting edge of chair 2x30s Seated shoulder retractions 2x10 yellow band   2x10 pelvic tilts seated - max cues but reports she feels much looser Seated hip adduction with ball: 5 hold x20 4x5 sit to stands with UE and purple pad to stand - mod cues for technique Standing marching x20 with bil UE support  Sidestepping at barre: 4 laps  Seated hamstring curls: yellow band x12 each    12/01/23  Findings from evaluation discussed, pt educated on plan of care, HEP initiated.   Heel raises/toe raises in seated x10 Hip ER with red band x10 Quad set x10 in seated with leg extended TUG Sit to  stands   12/03/23: Nustep - 6 mins L1 for reciprocal pattern in LE to better achieve stairs Hamstring stretch sitting EOM with strap 2x30s Seated shoulder retractions x10 > yellow band bil shoulder horizontal abduction x10 2x10 pelvic tilts seated - max cues but reports she feels much looser Seated hip abduction red loop 2x10 4x5 sit to stands with UE and purple pad to stand - mod cues for technique > high fear of falling and unable to stand with bil UE at counter height and needs one hand at chair seat to stand Gait training with SPC for 750' moderate verbal cues and tactile cues for posture, head carriage, decreased cadence to eliminate Lt leg limp with good effect and decreased Rt ER to neutral.  Seated wide step outs 2# x10  PATIENT EDUCATION:  Education details: [email or web address] Person educated: Patient Education method: Explanation, Demonstration, Tactile cues, Verbal cues, and Handouts Education comprehension: verbalized understanding, returned demonstration, and verbal cues required  HOME EXERCISE PROGRAM: Access Code: [email or web address] URL: [email or web address] Date: 12/07/2023 Prepared by: Burnard  Exercises - Seated Hip Abduction with Resistance  - 2-3 x daily - 7 x weekly - 1-2 sets - 10 reps - Seated Quad Set  - 2-3 x daily - 7 x weekly - 1-2 sets - 5 reps - 5  hold - Seated Heel Toe Raises  - 3 x daily - 7 x weekly - 2 sets - 10 reps - Sit to Stand with Armchair  - 2 x daily - 7 x weekly - 2 sets - 5 reps - Seated Long Arc Quad  - 2 x daily - 7 x weekly - 1 sets - 10 reps - Standing March with Counter Support  - 2 x daily - 7 x weekly - 10 reps - Seated Hip Adduction Isometrics with Ball  - 2 x daily - 7 x weekly - 1 sets - 10 reps - 5 hold   ASSESSMENT:  CLINICAL IMPRESSION: Patient challenged by standing TE with resistance today. She did well with 4 inch step ups, but 6 inch is still too challenging on the left. Patient is very motivated and wants to be challenged with  TE.   OBJECTIVE IMPAIRMENTS: Abnormal gait, cardiopulmonary status limiting activity, decreased activity tolerance, decreased balance, decreased coordination, decreased endurance, decreased knowledge of condition, decreased mobility, difficulty walking, decreased ROM, decreased strength, impaired sensation, impaired UE functional use, improper body mechanics, postural dysfunction, and pain.   ACTIVITY LIMITATIONS: carrying, lifting, bending, standing, squatting, stairs, transfers, toileting, reach over head, and caring for others  PARTICIPATION LIMITATIONS: meal prep, cleaning, laundry, and yard work  PERSONAL FACTORS: Age, Fitness, Social background, Time since onset of injury/illness/exacerbation, and 3+ comorbidities:    REHAB POTENTIAL: Good  CLINICAL DECISION MAKING: Evolving/moderate complexity  EVALUATION COMPLEXITY: Moderate   GOALS: Goals reviewed with patient? Yes  SHORT TERM GOALS: Target date: 12/25/23 Pt will be independent with initial HEP so she can maintain PT gains and increase functional capacity.  Baseline: Goal status: INITIAL  2.  Pt will complete  3 min walk test to document her baseline.  Baseline:  Goal status: INITIAL     LONG TERM GOALS: Target date: 01/22/24  Pt will be independent with advanced HEP so she can maintain PT gains and increase functional capacity.  Baseline:  Goal status: INITIAL  2.  Pt will perform 5 Sit to stands in < or = to 11 seconds or less without UE support so she is able to perform transfers independently.  Baseline: 15.27 sec Goal status: INITIAL  3.  Pt will score 4+/5 in all LE motions so she increases hip stability for improved functional capacity for navigating stairs.  Baseline:  Goal status: INITIAL  4.  Pt will complete Single limb stance for > or = to 10+ seconds bilaterally so she demonstrates improved balance for car transfers, and long standing.  Baseline:  Goal status: INITIAL  5.  Pt will complete TUG in  less than or equal to 14 sec so she improves balance and coordination so she can go down stairs safely.  Baseline: 19 sec  Goal status: INITIAL    PLAN:  PT FREQUENCY: 2x/week  PT DURATION: 8 weeks  PLANNED INTERVENTIONS: [id number]- PT Re-evaluation, 97750- Physical Performance Testing, 97110-Therapeutic exercises, 97530- Therapeutic activity, [id number]- Neuromuscular re-education, 97535- Self Care, [id number]- Manual therapy, [id number]- Gait training, [phone]- Orthotic/Prosthetic subsequent, [phone]- Canalith repositioning, [id number]- Aquatic Therapy, [phone]- Electrical stimulation (manual), [id number]- Ultrasound, [id number]- Traction (mechanical), [id number]- Ionotophoresis 4mg /ml Dexamethasone , 02981- Parrafin, Patient/Family education, Balance training, Stair training, Taping, Joint mobilization, Spinal mobilization, Manual lymph drainage, Scar mobilization, Compression bandaging, Vestibular training, DME instructions, Wheelchair mobility training, Cryotherapy, and Moist heat  PLAN FOR NEXT SESSION: LE strengthening, flexibility, assess/update HEP, balance tasks  Mliss Cummins, PT  12/15/23 10:19 AM   [facility]  [facility] [address] [place], [place] [address] Phone # [phone] Fax [phone]

## 2023-12-15 ENCOUNTER — Encounter: Payer: Self-pay | Admitting: Physical Therapy

## 2023-12-15 ENCOUNTER — Ambulatory Visit: Attending: Family Medicine | Admitting: Physical Therapy

## 2023-12-15 DIAGNOSIS — M6281 Muscle weakness (generalized): Secondary | ICD-10-CM | POA: Diagnosis present

## 2023-12-15 DIAGNOSIS — R252 Cramp and spasm: Secondary | ICD-10-CM | POA: Diagnosis present

## 2023-12-15 DIAGNOSIS — R2689 Other abnormalities of gait and mobility: Secondary | ICD-10-CM | POA: Insufficient documentation

## 2023-12-15 DIAGNOSIS — R262 Difficulty in walking, not elsewhere classified: Secondary | ICD-10-CM | POA: Insufficient documentation

## 2023-12-15 DIAGNOSIS — M25562 Pain in left knee: Secondary | ICD-10-CM | POA: Insufficient documentation

## 2023-12-15 DIAGNOSIS — R293 Abnormal posture: Secondary | ICD-10-CM | POA: Diagnosis present

## 2023-12-15 DIAGNOSIS — M19012 Primary osteoarthritis, left shoulder: Secondary | ICD-10-CM | POA: Insufficient documentation

## 2023-12-15 DIAGNOSIS — M19011 Primary osteoarthritis, right shoulder: Secondary | ICD-10-CM | POA: Diagnosis present

## 2023-12-17 ENCOUNTER — Ambulatory Visit

## 2023-12-17 DIAGNOSIS — M25562 Pain in left knee: Secondary | ICD-10-CM

## 2023-12-17 DIAGNOSIS — M6281 Muscle weakness (generalized): Secondary | ICD-10-CM

## 2023-12-17 DIAGNOSIS — R2689 Other abnormalities of gait and mobility: Secondary | ICD-10-CM

## 2023-12-17 DIAGNOSIS — M19011 Primary osteoarthritis, right shoulder: Secondary | ICD-10-CM

## 2023-12-17 NOTE — Therapy (Signed)
 OUTPATIENT PHYSICAL THERAPY LOWER EXTREMITY Treatment   Patient Name: Tami Schmidt MRN: [id number] DOB:01-Sep-1932, 89 y.o., female Today's Date: 12/17/2023  END OF SESSION:  PT End of Session - 12/17/23 1203     Visit Number 6    Date for PT Re-Evaluation 01/26/24    Authorization Type [employer]    PT Start Time 1102    PT Stop Time 1146    PT Time Calculation (min) 44 min    Activity Tolerance Patient tolerated treatment well    Behavior During Therapy WFL for tasks assessed/performed              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   Diabetes mellitus    fasting 90-100   HTN (hypertension) 03/25/2013   Hx of radiation therapy    breasts hx   Hypercholesterolemia    Hypertension    Lymphedema of arm    left arm   Pulmonary embolism (HCC)    Type II or unspecified type diabetes mellitus without mention of complication, not stated as uncontrolled 03/25/2013   Past Surgical History:  Procedure Laterality Date   ABDOMINAL HYSTERECTOMY     ANKLE SURGERY Right 1995   APPENDECTOMY     BIOPSY  01/07/2018   Procedure: BIOPSY;  Surgeon: Saintclair Jasper, MD;  Location: [facility];  Service: Gastroenterology;;   BREAST SURGERY Bilateral    mastectomy   COLONOSCOPY WITH PROPOFOL  N/A 01/07/2018   Procedure: COLONOSCOPY WITH PROPOFOL ;  Surgeon: Saintclair Jasper, MD;  Location: [facility];  Service: Gastroenterology;  Laterality: N/A;   ESOPHAGOGASTRODUODENOSCOPY (EGD) WITH PROPOFOL  N/A 01/06/2018   Procedure: ESOPHAGOGASTRODUODENOSCOPY (EGD) WITH PROPOFOL ;  Surgeon: Saintclair Jasper, MD;  Location: [facility];  Service: Gastroenterology;  Laterality: N/A;   HERNIA REPAIR  04-26-2010   IR IMAGING GUIDED PORT INSERTION  10/29/2021   MASS EXCISION Left 01/26/2013   Procedure: EXCISION LEFT CHEST WALL MASS AND LEFT ABDOMNAL WALL MASS;  Surgeon:  Vicenta DELENA Poli, MD;  Location: [facility];  Service: General;  Laterality: Left;   MASS EXCISION Left 09/20/2014   Procedure: EXCISION OF LEFT CHEST WALL MASS;  Surgeon: Vicenta Poli, MD;  Location: [facility];  Service: General;  Laterality: Left;   TEE WITHOUT CARDIOVERSION N/A 12/08/2017   Procedure: TRANSESOPHAGEAL ECHOCARDIOGRAM (TEE);  Surgeon: Pietro Redell RAMAN, MD;  Location: [facility];  Service: Cardiovascular;  Laterality: N/A;   TOTAL HIP ARTHROPLASTY Right 01/05/2017   TOTAL HIP ARTHROPLASTY Right 01/05/2017   Procedure: TOTAL HIP ARTHROPLASTY ANTERIOR APPROACH;  Surgeon: Liam Lerner, MD;  Location: [facility];  Service: Orthopedics;  Laterality: Right;   Patient Active Problem List   Diagnosis Date Noted   Acute lower GI bleeding 01/07/2023   Rectal bleeding 01/05/2023   Acute GI bleeding 10/04/2022   Port-A-Cath in place 06/12/2022   Loss of perception for taste 03/17/2022   Primary osteoarthritis involving multiple joints 03/17/2022   Genetic testing 07/24/2021   Microcytosis 04/29/2021   Hypokalemia 04/28/2021   Pulmonary embolism (HCC)    Agnosia 08/09/2020   Hardening of the aorta (main artery of the heart) (HCC) 08/09/2020   Morbid obesity (HCC) 08/09/2020   Neuropathy 08/09/2020   Primary osteoarthritis 08/09/2020   Sensorineural hearing loss (SNHL) of both ears 08/07/2020   Anosmia 06/05/2020   Tinnitus, bilateral 06/05/2020  GI bleed 07/13/2019   ABLA (acute blood loss anemia) 07/12/2019   Microcytic anemia 01/05/2018   Melena 01/04/2018   Lower GI bleed 01/04/2018   Diabetes mellitus type II, controlled, with no complications (HCC) 12/05/2017   Tenosynovitis of left wrist 12/02/2017   Infection of left wrist (HCC) 11/30/2017   Primary osteoarthritis of right hip 01/05/2017   Osteoarthritis of right hip 01/03/2017   Lymphedema of upper extremity 01/17/2014   Osteopenia 01/17/2014   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   Dilated pore of [place] 08/02/2013   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 08/02/2013   Scar 08/02/2013   Malignant neoplasm of lower-inner quadrant of left breast in female, estrogen receptor positive (HCC) 06/16/2013   Type II or unspecified type diabetes mellitus without mention of complication, not stated as uncontrolled 03/25/2013   Essential hypertension 03/25/2013   Chest wall recurrence of breast cancer (HCC) 02/21/2013   Abdominal wall mass 01/14/2013    PCP: Following with cancer center   REFERRING PROVIDER: Joane Birmingham, MD  REFERRING DIAG: Chronic pain of both shoulders, Chronic pain of left knee, Balance problem   THERAPY DIAG:  Left knee pain, unspecified chronicity  Primary osteoarthritis of shoulders, bilateral  Other abnormalities of gait and mobility  Muscle weakness (generalized)  Rationale for Evaluation and Treatment: Rehabilitation  ONSET DATE: 3-4 years ago  SUBJECTIVE:   SUBJECTIVE STATEMENT: This therapy is really helping me.  I am not as stiff and I feel more stable.  The adjustment to my cane has helped.   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 Pts husband has wrapped before and she has a night garment  PAIN:   12/15/23  1/10 pain in L knee Are you having  pain? Yes: Pain location: bil shoulders Pain description: ache, didn't rate the pain Aggravating factors: lifting arms to side or forward  Relieving factors: not moving it  PRECAUTIONS: None  RED FLAGS: None   WEIGHT BEARING RESTRICTIONS: No  FALLS:  Has patient fallen in last 6 months? Yes. Number of falls 1- PT will address falls in PT treatment   LIVING ENVIRONMENT: Lives with: lives with their family and lives with their spouse Lives in: House/apartment Stairs: Yes: Internal: 16 steps; on right going up Has following equipment at home: Single point cane  OCCUPATION: Retired, husband is the Financial trader, read   PLOF: Independent  PATIENT GOALS: Walk up the steps with both feet, strengthen both knees  NEXT MD VISIT: Sees Oncologist in 1 month  OBJECTIVE:  Note: Objective measures were completed at Evaluation unless otherwise noted.  DIAGNOSTIC FINDINGS:   PATIENT SURVEYS:  LEFS  Extreme difficulty/unable (0), Quite a bit of difficulty (1), Moderate difficulty (2), Little difficulty (3), No difficulty (4) Survey date:  12/01/23  Any of your usual work, housework or school activities 3  2. Usual hobbies, recreational or sporting  activities 3  3. Getting into/out of the bath 3  4. Walking between rooms 3  5. Putting on socks/shoes 3  6. Squatting  0  7. Lifting an object, like a bag of groceries from the floor 2  8. Performing light activities around your home 3  9. Performing heavy activities around your home 1  10. Getting into/out of a car 4  11. Walking 2 blocks 3  12. Walking 1 mile 0  13. Going up/down 10 stairs (1 flight) 3  14. Standing for 1 hour 2  15.  sitting for 1 hour 4  16. Running on even ground 0  17. Running on uneven ground 0  18. Making sharp turns while running fast 0  19. Hopping  0  20. Rolling over in bed 3  Score total:  40/80     COGNITION: Overall cognitive status: Within functional limits for tasks assessed     SENSATION: Not  tested Neuropathy in bilateral feet at night , why pt takes gabapentin     MUSCLE LENGTH: Hamstrings: Right side tightness; Left side tightness   POSTURE: rounded shoulders, forward head, and flexed trunk   PALPATION: NA  UPPER EXTREMITY ROM: Shoulder strength: 4/5 shoulder flexion and extension, abduction 4-/5, IR/ER 3+/5 tested in neutral Shoulder mobility: limited 60% in bilateral shoul flexion/Abd/ER/IR with pain and weakness reported   LOWER EXTREMITY ROM:  Active ROM Right eval Left eval  Hip flexion WNL WNL  Hip extension Dec ROM Dec Rom  Hip abduction Dec ROM Dec ROM  Hip adduction    Hip internal rotation Dec ROM Dec ROM  Hip external rotation Dec ROM Dec ROM  Knee flexion Dec ROM Dec ROM  Knee extension Dec ROM Dec ROM  Ankle dorsiflexion    Ankle plantarflexion    Ankle inversion    Ankle eversion     (Blank rows = not tested)  LOWER EXTREMITY MMT:  MMT Right eval Left eval  Hip flexion 4- 3+  Hip extension 4- 3+  Hip abduction 4- 3+  Hip adduction    Hip internal rotation 4- 4-  Hip external rotation 4- 4-  Knee flexion 4 3+  Knee extension 4- 3+  Ankle dorsiflexion    Ankle plantarflexion    Ankle inversion    Ankle eversion     (Blank rows = not tested)  LOWER EXTREMITY SPECIAL TESTS:    FUNCTIONAL TESTS:  8/19/255 times sit to stand: 15.27 sec used UE support  Timed up and go (TUG): 19.27 no cane; 19.00 sec with cane  3 minute walk test: not complete  12/17/23:  5x sit to stand: 19.73 seconds  TUG: 19.84 seconds   GAIT: Distance walked: 300 ft  Assistive device utilized: Single point cane Level of assistance: Min A, genu varus and antalgic gait. Comments: Pt walked from wait room across ortho gym to private room and back  TREATMENT DATE:  12/17/23: Nustep - 6 mins L5 - PT present to discuss progress  1x 10   with one UE assist sit to stands and purple pad to stand, then 1x10 with 2 UE assist - mod cues for technique Step ups fwd on 4 inch 2x10, lateral 1x10, attempted 6 inch but uses UE to pull Seated LAQ 4# 2x10 ER with 2# seated x10 each  Standing heel raises  x 20  Weight shift on balance pad 3 ways x 1 min each  Standing marching 4#  x20 with bil UE support  Seated hip adduction with ball: 5 hold x20 Standing hip ABD and extension 2x10 Bil on balance pad Seated HS stretch 2x20 Sit to stand 2x5    12/15/23: Nustep - 6 mins L5 for reciprocal pattern in LE to better achieve stairs- PT present to discuss progress  1x 10  with one UE assist sit to stands and purple pad to stand, then 1x10 with 2 UE assist - mod cues for technique Step ups fwd on 4 inch 2x10, lateral 1x10, attempted 6 inch but uses UE to pull Seated LAQ 4# 2x10 Standing heel raises 4#  x 20 Standing marching 4#  x20 with bil UE support  Seated hip adduction with ball: 5 hold x20 Standing hip ABD 2# 3x5 B Standing HS curl 2# 2x 10 B Seated HS stretch 2x20 Functional squat partial range toward chair x 10   12/10/23: Nustep - 7 mins L5 for reciprocal pattern in LE to better achieve stairs- PT present to discuss progress  Hamstring stretch sitting edge of chair with foot on 6 inch step 2x30s Seated hamstring curls:green band x20 each Seated shoulder retractions 2x10 yellow band  Seated LAQ 3# 2x10 Standing heel raises x 20 Standing marching x20 with bil UE support  Seated hip adduction with ball: 5 hold x20 1x 10, 1x 8  with one UE assist sit to stands and purple pad to stand - mod cues for technique Sidestepping at barre: 4 laps yellow loop at thighs, then at ankles x 4 laps Adjusted cane to correct height   PATIENT EDUCATION:  Education details: [email or web address] Person educated: Patient Education method: Programmer, multimedia, Demonstration, Tactile cues, Verbal cues, and Handouts Education comprehension: verbalized  understanding, returned demonstration, and verbal cues required  HOME EXERCISE PROGRAM: Access Code: [email or web address] URL: [email or web address] Date: 12/07/2023 Prepared by: Burnard  Exercises - Seated Hip Abduction with Resistance  - 2-3 x daily - 7 x weekly - 1-2 sets - 10 reps - Seated Quad Set  - 2-3 x daily - 7 x weekly - 1-2 sets - 5 reps - 5 hold - Seated Heel Toe Raises  - 3 x daily - 7 x weekly - 2 sets - 10 reps - Sit to Stand with Armchair  - 2 x daily - 7 x weekly - 2 sets - 5 reps - Seated Long Arc Quad  - 2 x daily - 7 x weekly - 1 sets - 10 reps - Standing March with Counter Support  - 2 x daily - 7 x weekly - 10 reps - Seated Hip Adduction Isometrics with Ball  - 2 x daily - 7 x weekly - 1 sets - 10 reps - 5 hold   ASSESSMENT:  CLINICAL IMPRESSION: Pt reports that she feels loser and more stable since the start of care.  She is independent and compliant with her HEP. She is challenged with standing exercise and requires close supervision and UE support  for safety.  No change in functional measures although pt reports that she is less fatigued with these. Patient will benefit from skilled PT to address the below impairments and improve overall function.   OBJECTIVE IMPAIRMENTS: Abnormal gait, cardiopulmonary status limiting activity, decreased activity tolerance, decreased balance, decreased coordination, decreased endurance, decreased knowledge of condition, decreased mobility, difficulty walking, decreased ROM, decreased strength, impaired sensation, impaired UE functional use, improper body mechanics, postural dysfunction, and pain.   ACTIVITY LIMITATIONS: carrying, lifting, bending, standing, squatting, stairs, transfers, toileting, reach over head, and caring for others  PARTICIPATION LIMITATIONS: meal prep, cleaning, laundry, and yard work  PERSONAL FACTORS: Age, Fitness, Social background, Time since onset of injury/illness/exacerbation, and 3+ comorbidities:     REHAB POTENTIAL: Good  CLINICAL DECISION MAKING: Evolving/moderate complexity  EVALUATION COMPLEXITY: Moderate   GOALS: Goals reviewed with patient? Yes  SHORT TERM GOALS: Target date: 12/25/23 Pt will be independent with initial HEP so she can maintain PT gains and increase functional capacity.  Baseline: Goal status: in progress   2.  Pt will complete  3 min walk test to document her baseline.  Baseline:  Goal status: INITIAL     LONG TERM GOALS: Target date: 01/22/24  Pt will be independent with advanced HEP so she can maintain PT gains and increase functional capacity.  Baseline:  Goal status: INITIAL  2.  Pt will perform 5 Sit to stands in < or = to 11 seconds or less without UE support so she is able to perform transfers independently.  Baseline: 19 seconds (12/17/23) Goal status: INITIAL  3.  Pt will score 4+/5 in all LE motions so she increases hip stability for improved functional capacity for navigating stairs.  Baseline:  Goal status: INITIAL  4.  Pt will complete Single limb stance for > or = to 10+ seconds bilaterally so she demonstrates improved balance for car transfers, and long standing.  Baseline:  Goal status: INITIAL  5.  Pt will complete TUG in less than or equal to 14 sec so she improves balance and coordination so she can go down stairs safely.  Baseline: 19 sec  Goal status: INITIAL    PLAN:  PT FREQUENCY: 2x/week  PT DURATION: 8 weeks  PLANNED INTERVENTIONS: [id number]- PT Re-evaluation, 97750- Physical Performance Testing, 97110-Therapeutic exercises, 97530- Therapeutic activity, [id number]- Neuromuscular re-education, 97535- Self Care, [id number]- Manual therapy, [id number]- Gait training, [id number]- Orthotic/Prosthetic subsequent, [phone]- Canalith repositioning, [id number]- Aquatic Therapy, [phone]- Electrical stimulation (manual), [id number]- Ultrasound, [id number]- Traction (mechanical), [id number]- Ionotophoresis 4mg /ml Dexamethasone , 02981- Parrafin, Patient/Family education,  Balance training, Stair training, Taping, Joint mobilization, Spinal mobilization, Manual lymph drainage, Scar mobilization, Compression bandaging, Vestibular training, DME instructions, Wheelchair mobility training, Cryotherapy, and Moist heat  PLAN FOR NEXT SESSION: LE strengthening, flexibility, assess/update HEP to add standing tasks, balance tasks, 3 min walk test, standing with weights or on balance pad.   Burnard Joy, PT 12/17/23 12:04 PM   [facility] [address], [address] [place], [place] [address] Phone # [phone] Fax [phone]

## 2023-12-19 ENCOUNTER — Other Ambulatory Visit: Payer: Self-pay | Admitting: Hematology and Oncology

## 2023-12-21 ENCOUNTER — Ambulatory Visit

## 2023-12-21 DIAGNOSIS — M25562 Pain in left knee: Secondary | ICD-10-CM | POA: Diagnosis not present

## 2023-12-21 DIAGNOSIS — R2689 Other abnormalities of gait and mobility: Secondary | ICD-10-CM

## 2023-12-21 DIAGNOSIS — M19011 Primary osteoarthritis, right shoulder: Secondary | ICD-10-CM

## 2023-12-21 DIAGNOSIS — M6281 Muscle weakness (generalized): Secondary | ICD-10-CM

## 2023-12-21 NOTE — Therapy (Signed)
 OUTPATIENT PHYSICAL THERAPY LOWER EXTREMITY Treatment   Patient Name: RHYLEIGH GRASSEL MRN: [id number] DOB:10-24-1932, 89 y.o., female Today's Date: 12/21/2023  END OF SESSION:  PT End of Session - 12/21/23 1107     Visit Number 7    Date for PT Re-Evaluation 01/26/24    Authorization Type [employer]    Authorization Time Period 8/19 to 10/14- 16 visits    Authorization - Visit Number 6    Authorization - Number of Visits 16    PT Start Time 1014    PT Stop Time 1059    PT Time Calculation (min) 45 min    Activity Tolerance Patient tolerated treatment well    Behavior During Therapy WFL for tasks assessed/performed               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   Diabetes mellitus    fasting 90-100   HTN (hypertension) 03/25/2013   Hx of radiation therapy    breasts hx   Hypercholesterolemia    Hypertension    Lymphedema of arm    left arm   Pulmonary embolism (HCC)    Type II or unspecified type diabetes mellitus without mention of complication, not stated as uncontrolled 03/25/2013   Past Surgical History:  Procedure Laterality Date   ABDOMINAL HYSTERECTOMY     ANKLE SURGERY Right 1995   APPENDECTOMY     BIOPSY  01/07/2018   Procedure: BIOPSY;  Surgeon: Saintclair Jasper, MD;  Location: [facility];  Service: Gastroenterology;;   BREAST SURGERY Bilateral    mastectomy   COLONOSCOPY WITH PROPOFOL  N/A 01/07/2018   Procedure: COLONOSCOPY WITH PROPOFOL ;  Surgeon: Saintclair Jasper, MD;  Location: [facility];  Service: Gastroenterology;  Laterality: N/A;   ESOPHAGOGASTRODUODENOSCOPY (EGD) WITH PROPOFOL  N/A 01/06/2018   Procedure: ESOPHAGOGASTRODUODENOSCOPY (EGD) WITH PROPOFOL ;  Surgeon: Saintclair Jasper, MD;  Location: [facility];  Service: Gastroenterology;  Laterality: N/A;   HERNIA REPAIR  04-26-2010   IR IMAGING GUIDED PORT INSERTION   10/29/2021   MASS EXCISION Left 01/26/2013   Procedure: EXCISION LEFT CHEST WALL MASS AND LEFT ABDOMNAL WALL MASS;  Surgeon: Vicenta DELENA Poli, MD;  Location: [facility];  Service: General;  Laterality: Left;   MASS EXCISION Left 09/20/2014   Procedure: EXCISION OF LEFT CHEST WALL MASS;  Surgeon: Vicenta Poli, MD;  Location: [facility];  Service: General;  Laterality: Left;   TEE WITHOUT CARDIOVERSION N/A 12/08/2017   Procedure: TRANSESOPHAGEAL ECHOCARDIOGRAM (TEE);  Surgeon: Pietro Redell RAMAN, MD;  Location: [facility];  Service: Cardiovascular;  Laterality: N/A;   TOTAL HIP ARTHROPLASTY Right 01/05/2017   TOTAL HIP ARTHROPLASTY Right 01/05/2017   Procedure: TOTAL HIP ARTHROPLASTY ANTERIOR APPROACH;  Surgeon: Liam Lerner, MD;  Location: [facility];  Service: Orthopedics;  Laterality: Right;   Patient Active Problem List   Diagnosis Date Noted   Acute lower GI bleeding 01/07/2023   Rectal bleeding 01/05/2023   Acute GI bleeding 10/04/2022   Port-A-Cath in place 06/12/2022   Loss of perception for taste 03/17/2022   Primary osteoarthritis involving multiple joints 03/17/2022   Genetic testing 07/24/2021   Microcytosis 04/29/2021   Hypokalemia 04/28/2021   Pulmonary embolism (HCC)    Agnosia 08/09/2020   Hardening of the aorta (main artery of the heart) (HCC) 08/09/2020   Morbid obesity (HCC) 08/09/2020  Neuropathy 08/09/2020   Primary osteoarthritis 08/09/2020   Sensorineural hearing loss (SNHL) of both ears 08/07/2020   Anosmia 06/05/2020   Tinnitus, bilateral 06/05/2020   GI bleed 07/13/2019   ABLA (acute blood loss anemia) 07/12/2019   Microcytic anemia 01/05/2018   Melena 01/04/2018   Lower GI bleed 01/04/2018   Diabetes mellitus type II, controlled, with no complications (HCC) 12/05/2017   Tenosynovitis of left wrist 12/02/2017   Infection of left wrist (HCC) 11/30/2017   Primary osteoarthritis of right hip 01/05/2017   Osteoarthritis of right hip 01/03/2017    Lymphedema of upper extremity 01/17/2014   Osteopenia 01/17/2014   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   Dilated pore of [place] 08/02/2013   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 08/02/2013   Scar 08/02/2013   Malignant neoplasm of lower-inner quadrant of left breast in female, estrogen receptor positive (HCC) 06/16/2013   Type II or unspecified type diabetes mellitus without mention of complication, not stated as uncontrolled 03/25/2013   Essential hypertension 03/25/2013   Chest wall recurrence of breast cancer (HCC) 02/21/2013   Abdominal wall mass 01/14/2013    PCP: Following with cancer center   REFERRING PROVIDER: Joane Birmingham, MD  REFERRING DIAG: Chronic pain of both shoulders, Chronic pain of left knee, Balance problem   THERAPY DIAG:  Left knee pain, unspecified chronicity  Primary osteoarthritis of shoulders, bilateral  Other abnormalities of gait and mobility  Muscle weakness (generalized)  Rationale for Evaluation and Treatment: Rehabilitation  ONSET DATE: 3-4 years ago  SUBJECTIVE:   SUBJECTIVE STATEMENT: This therapy is really helping me.  I am not as stiff and I feel more stable.  The adjustment to my cane has helped.   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 Pts husband has wrapped before and she has a night garment  PAIN:   12/15/23  1/10 pain in L knee Are you having pain? Yes: Pain location: bil shoulders Pain description: ache, didn't rate the pain Aggravating factors: lifting arms to side or forward  Relieving factors: not moving it  PRECAUTIONS: None  RED FLAGS: None   WEIGHT BEARING RESTRICTIONS: No  FALLS:  Has patient fallen in last 6 months? Yes. Number of falls 1- PT will address falls in PT treatment   LIVING ENVIRONMENT: Lives with: lives with their family and lives with their spouse Lives in: House/apartment Stairs: Yes: Internal: 16 steps; on right going up Has following equipment at home: Single point cane  OCCUPATION: Retired, husband is the Financial trader, read   PLOF: Independent  PATIENT GOALS: Walk up the steps with both feet, strengthen both knees  NEXT MD VISIT: Sees Oncologist in 1 month  OBJECTIVE:  Note: Objective measures were completed at Evaluation unless otherwise noted.  DIAGNOSTIC FINDINGS:   PATIENT SURVEYS:  LEFS  Extreme difficulty/unable (0), Quite a bit of difficulty (1), Moderate difficulty (2),  Little difficulty (3), No difficulty (4) Survey date:  12/01/23  Any of your usual work, housework or school activities 3  2. Usual hobbies, recreational or sporting activities 3  3. Getting into/out of the bath 3  4. Walking between rooms 3  5. Putting on socks/shoes 3  6. Squatting  0  7. Lifting an object, like a bag of groceries from the floor 2  8. Performing light activities around your home 3  9. Performing heavy activities around your home 1  10. Getting into/out of a car 4  11. Walking 2 blocks 3  12. Walking 1 mile 0  13. Going up/down 10 stairs (1 flight) 3  14. Standing for 1 hour 2  15.  sitting for 1 hour 4  16. Running on even ground 0  17. Running on uneven ground 0  18. Making sharp turns while running fast 0  19. Hopping  0  20. Rolling over in bed 3  Score  total:  40/80     COGNITION: Overall cognitive status: Within functional limits for tasks assessed     SENSATION: Not tested Neuropathy in bilateral feet at night , why pt takes gabapentin     MUSCLE LENGTH: Hamstrings: Right side tightness; Left side tightness   POSTURE: rounded shoulders, forward head, and flexed trunk   PALPATION: NA  UPPER EXTREMITY ROM: Shoulder strength: 4/5 shoulder flexion and extension, abduction 4-/5, IR/ER 3+/5 tested in neutral Shoulder mobility: limited 60% in bilateral shoul flexion/Abd/ER/IR with pain and weakness reported   LOWER EXTREMITY ROM:  Active ROM Right eval Left eval  Hip flexion WNL WNL  Hip extension Dec ROM Dec Rom  Hip abduction Dec ROM Dec ROM  Hip adduction    Hip internal rotation Dec ROM Dec ROM  Hip external rotation Dec ROM Dec ROM  Knee flexion Dec ROM Dec ROM  Knee extension Dec ROM Dec ROM  Ankle dorsiflexion    Ankle plantarflexion    Ankle inversion    Ankle eversion     (Blank rows = not tested)  LOWER EXTREMITY MMT:  MMT Right eval Left eval  Hip flexion 4- 3+  Hip extension 4- 3+  Hip abduction 4- 3+  Hip adduction    Hip internal rotation 4- 4-  Hip external rotation 4- 4-  Knee flexion 4 3+  Knee extension 4- 3+  Ankle dorsiflexion    Ankle plantarflexion    Ankle inversion    Ankle eversion     (Blank rows = not tested)  LOWER EXTREMITY SPECIAL TESTS:    FUNCTIONAL TESTS:  8/19/255 times sit to stand: 15.27 sec used UE support  Timed up and go (TUG): 19.27 no cane; 19.00 sec with cane  3 minute walk test: not complete  12/17/23:  5x sit to stand: 19.73 seconds  TUG: 19.84 seconds   12/21/23:  3 min walk: 310 feet with 3/10 RPE  GAIT: Distance walked: 300 ft  Assistive device utilized: Single point cane Level of assistance: Min A, genu varus and antalgic gait. Comments: Pt walked from wait room across ortho gym to private room and back  TREATMENT DATE:  12/21/23: Nustep - 6 mins L5 - PT present to discuss progress  3 min walk test with cane: 310 feet with 3/10 RPE Step ups fwd on 4 inch 2x10, lateral 1x10, attempted 6 inch but uses UE to pull Seated LAQ 4# 2x10 ER with 4# seated x10 each  Standing heel raises  x 20  Weight shift on balance pad 3 ways x 1 min each  Standing marching 4#  x20 with bil UE support  Seated hip adduction with ball: 5 hold x20 Standing hip ABD and extension 2x10 Bil on balance pad Seated HS stretch 2x20 Step up on 4 step Rt and Lt 2x10   12/17/23: Nustep - 6 mins L5 - PT present to discuss progress  1x 10  with one UE assist sit to stands and purple pad to stand, then 1x10 with 2 UE assist - mod cues for technique Step ups fwd on 4 inch 2x10, lateral 1x10, attempted 6 inch but uses UE to pull Seated LAQ 4# 2x10 ER with 2# seated x10 each  Standing heel raises  x 20  Weight shift on balance pad 3 ways x 1 min each  Standing marching 4#  x20 with bil UE support  Seated hip adduction with ball: 5 hold x20 Standing hip ABD and extension 2x10 Bil on balance pad Seated HS stretch 2x20 Sit to stand 2x5    12/15/23: Nustep - 6 mins L5 for reciprocal pattern in LE to better achieve stairs- PT present to discuss progress  1x 10  with one UE assist sit to stands and purple pad to stand, then 1x10 with 2 UE assist - mod cues for technique Step ups fwd on 4 inch 2x10, lateral 1x10, attempted 6 inch but uses UE to pull Seated LAQ 4# 2x10 Standing heel raises 4#  x 20 Standing marching 4#  x20 with bil UE support  Seated hip adduction with ball: 5 hold x20 Standing hip ABD 2# 3x5 B Standing HS curl 2# 2x 10 B Seated HS stretch 2x20 Functional squat partial range toward chair x 10   PATIENT EDUCATION:  Education details: [email or web address] Person educated: Patient Education method: Explanation, Demonstration, Tactile  cues, Verbal cues, and Handouts Education comprehension: verbalized understanding, returned demonstration, and verbal cues required  HOME EXERCISE PROGRAM: Access Code: [email or web address] URL: [email or web address] Date: 12/07/2023 Prepared by: Burnard  Exercises - Seated Hip Abduction with Resistance  - 2-3 x daily - 7 x weekly - 1-2 sets - 10 reps - Seated Quad Set  - 2-3 x daily - 7 x weekly - 1-2 sets - 5 reps - 5 hold - Seated Heel Toe Raises  - 3 x daily - 7 x weekly - 2 sets - 10 reps - Sit to Stand with Armchair  - 2 x daily - 7 x weekly - 2 sets - 5 reps - Seated Long Arc Quad  - 2 x daily - 7 x weekly - 1 sets - 10 reps - Standing March with Counter Support  - 2 x daily - 7 x weekly - 10 reps - Seated Hip Adduction Isometrics with Ball  - 2 x daily - 7 x weekly - 1 sets - 10 reps - 5 hold   ASSESSMENT:  CLINICAL IMPRESSION: Pt reports that she feels more stable overall since the start of care. She continues to be challenged with standing exercise and requires close supervision and UE support for safety with standing exercises.  3 min walk distance established  today.  Patient will benefit from skilled PT to address the below impairments and improve overall function.   OBJECTIVE IMPAIRMENTS: Abnormal gait, cardiopulmonary status limiting activity, decreased activity tolerance, decreased balance, decreased coordination, decreased endurance, decreased knowledge of condition, decreased mobility, difficulty walking, decreased ROM, decreased strength, impaired sensation, impaired UE functional use, improper body mechanics, postural dysfunction, and pain.   ACTIVITY LIMITATIONS: carrying, lifting, bending, standing, squatting, stairs, transfers, toileting, reach over head, and caring for others  PARTICIPATION LIMITATIONS: meal prep, cleaning, laundry, and yard work  PERSONAL FACTORS: Age, Fitness, Social background, Time since onset of injury/illness/exacerbation, and 3+  comorbidities:    REHAB POTENTIAL: Good  CLINICAL DECISION MAKING: Evolving/moderate complexity  EVALUATION COMPLEXITY: Moderate   GOALS: Goals reviewed with patient? Yes  SHORT TERM GOALS: Target date: 12/25/23 Pt will be independent with initial HEP so she can maintain PT gains and increase functional capacity.  Baseline: Goal status: in progress   2.  Pt will complete  3 min walk test to document her baseline.  Baseline: 310 feet (12/21/23) Goal status: MET     LONG TERM GOALS: Target date: 01/22/24  Pt will be independent with advanced HEP so she can maintain PT gains and increase functional capacity.  Baseline:  Goal status: INITIAL  2.  Pt will perform 5 Sit to stands in < or = to 11 seconds or less without UE support so she is able to perform transfers independently.  Baseline: 19 seconds (12/17/23) Goal status: INITIAL  3.  Pt will score 4+/5 in all LE motions so she increases hip stability for improved functional capacity for navigating stairs.  Baseline:  Goal status: INITIAL  4.  Pt will complete Single limb stance for > or = to 10+ seconds bilaterally so she demonstrates improved balance for car transfers, and long standing.  Baseline:  Goal status: INITIAL  5.  Pt will complete TUG in less than or equal to 14 sec so she improves balance and coordination so she can go down stairs safely.  Baseline: 19 sec  Goal status: INITIAL    PLAN:  PT FREQUENCY: 2x/week  PT DURATION: 8 weeks  PLANNED INTERVENTIONS: [id number]- PT Re-evaluation, 97750- Physical Performance Testing, 97110-Therapeutic exercises, 97530- Therapeutic activity, [id number]- Neuromuscular re-education, 97535- Self Care, 02859- Manual therapy, [id number]- Gait training, [phone]- Orthotic/Prosthetic subsequent, [phone]- Canalith repositioning, [id number]- Aquatic Therapy, [phone]- Electrical stimulation (manual), [id number]- Ultrasound, [id number]- Traction (mechanical), [id number]- Ionotophoresis 4mg /ml Dexamethasone , 02981- Parrafin,  Patient/Family education, Balance training, Stair training, Taping, Joint mobilization, Spinal mobilization, Manual lymph drainage, Scar mobilization, Compression bandaging, Vestibular training, DME instructions, Wheelchair mobility training, Cryotherapy, and Moist heat  PLAN FOR NEXT SESSION: LE strengthening, flexibility, assess/update HEP to add standing tasks, balance tasks,  standing with weights or on balance pad.   Burnard Joy, PT 12/21/23 11:10 AM   [facility] [address], [address] [place], [place] [address] Phone # [phone] Fax [phone]

## 2023-12-24 ENCOUNTER — Ambulatory Visit

## 2023-12-24 DIAGNOSIS — M6281 Muscle weakness (generalized): Secondary | ICD-10-CM

## 2023-12-24 DIAGNOSIS — M19011 Primary osteoarthritis, right shoulder: Secondary | ICD-10-CM

## 2023-12-24 DIAGNOSIS — R2689 Other abnormalities of gait and mobility: Secondary | ICD-10-CM

## 2023-12-24 DIAGNOSIS — M25562 Pain in left knee: Secondary | ICD-10-CM

## 2023-12-24 NOTE — Therapy (Signed)
 OUTPATIENT PHYSICAL THERAPY LOWER EXTREMITY Treatment   Patient Name: Tami Schmidt MRN: [id number] DOB:1932-12-19, 89 y.o., female Today's Date: 12/24/2023  END OF SESSION:  PT End of Session - 12/24/23 1140     Visit Number 8    Date for PT Re-Evaluation 01/26/24    Authorization Type [employer]    Authorization Time Period 8/19 to 10/14- 16 visits    Authorization - Visit Number 8    Authorization - Number of Visits 16    PT Start Time 1104    PT Stop Time 1143    PT Time Calculation (min) 39 min    Activity Tolerance Patient tolerated treatment well    Behavior During Therapy WFL for tasks assessed/performed                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   Diabetes mellitus    fasting 90-100   HTN (hypertension) 03/25/2013   Hx of radiation therapy    breasts hx   Hypercholesterolemia    Hypertension    Lymphedema of arm    left arm   Pulmonary embolism (HCC)    Type II or unspecified type diabetes mellitus without mention of complication, not stated as uncontrolled 03/25/2013   Past Surgical History:  Procedure Laterality Date   ABDOMINAL HYSTERECTOMY     ANKLE SURGERY Right 1995   APPENDECTOMY     BIOPSY  01/07/2018   Procedure: BIOPSY;  Surgeon: Saintclair Jasper, MD;  Location: [facility];  Service: Gastroenterology;;   BREAST SURGERY Bilateral    mastectomy   COLONOSCOPY WITH PROPOFOL  N/A 01/07/2018   Procedure: COLONOSCOPY WITH PROPOFOL ;  Surgeon: Saintclair Jasper, MD;  Location: [facility];  Service: Gastroenterology;  Laterality: N/A;   ESOPHAGOGASTRODUODENOSCOPY (EGD) WITH PROPOFOL  N/A 01/06/2018   Procedure: ESOPHAGOGASTRODUODENOSCOPY (EGD) WITH PROPOFOL ;  Surgeon: Saintclair Jasper, MD;  Location: [facility];  Service: Gastroenterology;  Laterality: N/A;   HERNIA REPAIR  04-26-2010   IR IMAGING GUIDED PORT  INSERTION  10/29/2021   MASS EXCISION Left 01/26/2013   Procedure: EXCISION LEFT CHEST WALL MASS AND LEFT ABDOMNAL WALL MASS;  Surgeon: Vicenta DELENA Poli, MD;  Location: [facility];  Service: General;  Laterality: Left;   MASS EXCISION Left 09/20/2014   Procedure: EXCISION OF LEFT CHEST WALL MASS;  Surgeon: Vicenta Poli, MD;  Location: [facility];  Service: General;  Laterality: Left;   TEE WITHOUT CARDIOVERSION N/A 12/08/2017   Procedure: TRANSESOPHAGEAL ECHOCARDIOGRAM (TEE);  Surgeon: Pietro Redell RAMAN, MD;  Location: [facility];  Service: Cardiovascular;  Laterality: N/A;   TOTAL HIP ARTHROPLASTY Right 01/05/2017   TOTAL HIP ARTHROPLASTY Right 01/05/2017   Procedure: TOTAL HIP ARTHROPLASTY ANTERIOR APPROACH;  Surgeon: Liam Lerner, MD;  Location: [facility];  Service: Orthopedics;  Laterality: Right;   Patient Active Problem List   Diagnosis Date Noted   Acute lower GI bleeding 01/07/2023   Rectal bleeding 01/05/2023   Acute GI bleeding 10/04/2022   Port-A-Cath in place 06/12/2022   Loss of perception for taste 03/17/2022   Primary osteoarthritis involving multiple joints 03/17/2022   Genetic testing 07/24/2021   Microcytosis 04/29/2021   Hypokalemia 04/28/2021   Pulmonary embolism (HCC)    Agnosia 08/09/2020   Hardening of the aorta (main artery of the heart) (HCC) 08/09/2020   Morbid obesity (HCC)  08/09/2020   Neuropathy 08/09/2020   Primary osteoarthritis 08/09/2020   Sensorineural hearing loss (SNHL) of both ears 08/07/2020   Anosmia 06/05/2020   Tinnitus, bilateral 06/05/2020   GI bleed 07/13/2019   ABLA (acute blood loss anemia) 07/12/2019   Microcytic anemia 01/05/2018   Melena 01/04/2018   Lower GI bleed 01/04/2018   Diabetes mellitus type II, controlled, with no complications (HCC) 12/05/2017   Tenosynovitis of left wrist 12/02/2017   Infection of left wrist (HCC) 11/30/2017   Primary osteoarthritis of right hip 01/05/2017   Osteoarthritis of right hip  01/03/2017   Lymphedema of upper extremity 01/17/2014   Osteopenia 01/17/2014   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   Dilated pore of Winer 08/02/2013   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 08/02/2013   Scar 08/02/2013   Malignant neoplasm of lower-inner quadrant of left breast in female, estrogen receptor positive (HCC) 06/16/2013   Type II or unspecified type diabetes mellitus without mention of complication, not stated as uncontrolled 03/25/2013   Essential hypertension 03/25/2013   Chest wall recurrence of breast cancer (HCC) 02/21/2013   Abdominal wall mass 01/14/2013    PCP: Following with cancer center   REFERRING PROVIDER: Joane Birmingham, MD  REFERRING DIAG: Chronic pain of both shoulders, Chronic pain of left knee, Balance problem   THERAPY DIAG:  Left knee pain, unspecified chronicity  Primary osteoarthritis of shoulders, bilateral  Other abnormalities of gait and mobility  Muscle weakness (generalized)  Rationale for Evaluation and Treatment: Rehabilitation  ONSET DATE: 3-4 years ago  SUBJECTIVE:   SUBJECTIVE STATEMENT: I almost fell in my kitchen yesterday.  My leg gave out and I  was able to catch myself on the counter.   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 Pts husband has wrapped before and she has a night garment  PAIN:   12/15/23  1/10 pain in L knee Are you having pain? Yes: Pain location: bil shoulders Pain description: ache, didn't rate the pain Aggravating factors: lifting arms to side or forward  Relieving factors: not moving it  PRECAUTIONS: None  RED FLAGS: None   WEIGHT BEARING RESTRICTIONS: No  FALLS:  Has patient fallen in last 6 months? Yes. Number of falls 1- PT will address falls in PT treatment   LIVING ENVIRONMENT: Lives with: lives with their family and lives with their spouse Lives in: House/apartment Stairs: Yes: Internal: 16 steps; on right going up Has following equipment at home: Single point cane  OCCUPATION: Retired, husband is the Financial trader, read   PLOF: Independent  PATIENT GOALS: Walk up the steps with both feet, strengthen both knees  NEXT MD VISIT: Sees Oncologist in 1 month  OBJECTIVE:  Note: Objective measures were completed at Evaluation unless otherwise noted.  DIAGNOSTIC FINDINGS:   PATIENT SURVEYS:  LEFS  Extreme difficulty/unable (0), Quite a bit of difficulty (1), Moderate difficulty (  2), Little difficulty (3), No difficulty (4) Survey date:  12/01/23  Any of your usual work, housework or school activities 3  2. Usual hobbies, recreational or sporting activities 3  3. Getting into/out of the bath 3  4. Walking between rooms 3  5. Putting on socks/shoes 3  6. Squatting  0  7. Lifting an object, like a bag of groceries from the floor 2  8. Performing light activities around your home 3  9. Performing heavy activities around your home 1  10. Getting into/out of a car 4  11. Walking 2 blocks 3  12. Walking 1 mile 0  13. Going up/down 10 stairs (1 flight) 3  14. Standing for 1 hour 2  15.  sitting for 1 hour 4  16. Running on even ground 0  17. Running on uneven ground 0  18. Making sharp turns while running fast 0  19. Hopping  0  20. Rolling over in bed 3  Score  total:  40/80     COGNITION: Overall cognitive status: Within functional limits for tasks assessed     SENSATION: Not tested Neuropathy in bilateral feet at night , why pt takes gabapentin     MUSCLE LENGTH: Hamstrings: Right side tightness; Left side tightness   POSTURE: rounded shoulders, forward head, and flexed trunk   PALPATION: NA  UPPER EXTREMITY ROM: Shoulder strength: 4/5 shoulder flexion and extension, abduction 4-/5, IR/ER 3+/5 tested in neutral Shoulder mobility: limited 60% in bilateral shoul flexion/Abd/ER/IR with pain and weakness reported   LOWER EXTREMITY ROM:  Active ROM Right eval Left eval  Hip flexion WNL WNL  Hip extension Dec ROM Dec Rom  Hip abduction Dec ROM Dec ROM  Hip adduction    Hip internal rotation Dec ROM Dec ROM  Hip external rotation Dec ROM Dec ROM  Knee flexion Dec ROM Dec ROM  Knee extension Dec ROM Dec ROM  Ankle dorsiflexion    Ankle plantarflexion    Ankle inversion    Ankle eversion     (Blank rows = not tested)  LOWER EXTREMITY MMT:  MMT Right eval Left eval  Hip flexion 4- 3+  Hip extension 4- 3+  Hip abduction 4- 3+  Hip adduction    Hip internal rotation 4- 4-  Hip external rotation 4- 4-  Knee flexion 4 3+  Knee extension 4- 3+  Ankle dorsiflexion    Ankle plantarflexion    Ankle inversion    Ankle eversion     (Blank rows = not tested)  LOWER EXTREMITY SPECIAL TESTS:    FUNCTIONAL TESTS:  8/19/255 times sit to stand: 15.27 sec used UE support  Timed up and go (TUG): 19.27 no cane; 19.00 sec with cane  3 minute walk test: not complete  12/17/23:  5x sit to stand: 19.73 seconds  TUG: 19.84 seconds   12/21/23:  3 min walk: 310 feet with 3/10 RPE  GAIT: Distance walked: 300 ft  Assistive device utilized: Single point cane Level of assistance: Min A, genu varus and antalgic gait. Comments: Pt walked from wait room across ortho gym to private room and back  TREATMENT DATE:  12/24/23: Nustep - 6 mins L5 - PT present to discuss progress  Seated LAQ 4# 2x10 ER with 4# seated x10 each  Standing heel raises  x 20  Weight shift on balance pad 3 ways x 1 min each  Standing marching 4#  x20 with bil UE support  Seated hip adduction with ball: 5 hold x20 Standing hip ABD and extension 2x10 Bil on balance pad Seated HS stretch 2x20 Step tap on 6 step with 4# ankle weights x20 Hurdles: forward and lateral with step to pattern.  Use of Ues for safety, 4 laps each   12/21/23: Nustep - 6 mins L5 - PT present to discuss progress  3 min walk test with cane: 310 feet with 3/10 RPE Step ups fwd on 4 inch 2x10, lateral 1x10, attempted 6 inch but uses UE to pull Seated LAQ 4# 2x10 ER with 4# seated x10 each  Standing heel raises  x 20  Weight shift on balance pad 3 ways x 1 min each  Standing marching 4#  x20 with bil UE support  Seated hip adduction with ball: 5 hold x20 Standing hip ABD and extension 2x10 Bil on balance pad Seated HS stretch 2x20 Step up on 4 step Rt and Lt 2x10   12/17/23: Nustep - 6 mins L5 - PT present to discuss progress  1x 10  with one UE assist sit to stands and purple pad to stand, then 1x10 with 2 UE assist - mod cues for technique Step ups fwd on 4 inch 2x10, lateral 1x10, attempted 6 inch but uses UE to pull Seated LAQ 4# 2x10 ER with 2# seated x10 each  Standing heel raises  x 20  Weight shift on balance pad 3 ways x 1 min each  Standing marching 4#  x20 with bil UE support  Seated hip adduction with ball: 5 hold x20 Standing hip ABD and extension 2x10 Bil on balance pad Seated HS stretch 2x20 Sit to stand 2x5    12/15/23: Nustep - 6 mins L5 for reciprocal pattern in LE to better achieve stairs- PT present to discuss progress  1x 10  with one UE assist sit to stands and purple pad to stand, then 1x10 with 2 UE assist - mod cues  for technique Step ups fwd on 4 inch 2x10, lateral 1x10, attempted 6 inch but uses UE to pull Seated LAQ 4# 2x10 Standing heel raises 4#  x 20 Standing marching 4#  x20 with bil UE support  Seated hip adduction with ball: 5 hold x20 Standing hip ABD 2# 3x5 B Standing HS curl 2# 2x 10 B Seated HS stretch 2x20 Functional squat partial range toward chair x 10   PATIENT EDUCATION:  Education details: [email or web address] Person educated: Patient Education method: Explanation, Demonstration, Tactile cues, Verbal cues, and Handouts Education comprehension: verbalized understanding, returned demonstration, and verbal cues required  HOME EXERCISE PROGRAM: Access Code: [email or web address] URL: [email or web address] Date: 12/07/2023 Prepared by: Burnard  Exercises - Seated Hip Abduction with Resistance  - 2-3 x daily - 7 x weekly - 1-2 sets - 10 reps - Seated Quad Set  - 2-3 x daily - 7 x weekly - 1-2 sets - 5 reps - 5 hold - Seated Heel Toe Raises  - 3 x daily - 7 x weekly - 2 sets - 10 reps - Sit to Stand with Armchair  - 2 x daily - 7 x weekly - 2 sets - 5 reps - Seated Long Arc  Quad  - 2 x daily - 7 x weekly - 1 sets - 10 reps - Standing March with Counter Support  - 2 x daily - 7 x weekly - 10 reps - Seated Hip Adduction Isometrics with Ball  - 2 x daily - 7 x weekly - 1 sets - 10 reps - 5 hold   ASSESSMENT:  CLINICAL IMPRESSION: Pt reports that she feels more stable overall since the start of care. She continues to be challenged with standing exercise and requires close supervision and UE support for safety with standing exercises.  Pt did well with negotiating hurdles forward and lateral. PT monitored for safety and for verbal cues  Patient will benefit from skilled PT to address the below impairments and improve overall function.   OBJECTIVE IMPAIRMENTS: Abnormal gait, cardiopulmonary status limiting activity, decreased activity tolerance, decreased balance, decreased coordination,  decreased endurance, decreased knowledge of condition, decreased mobility, difficulty walking, decreased ROM, decreased strength, impaired sensation, impaired UE functional use, improper body mechanics, postural dysfunction, and pain.   ACTIVITY LIMITATIONS: carrying, lifting, bending, standing, squatting, stairs, transfers, toileting, reach over head, and caring for others  PARTICIPATION LIMITATIONS: meal prep, cleaning, laundry, and yard work  PERSONAL FACTORS: Age, Fitness, Social background, Time since onset of injury/illness/exacerbation, and 3+ comorbidities:    REHAB POTENTIAL: Good  CLINICAL DECISION MAKING: Evolving/moderate complexity  EVALUATION COMPLEXITY: Moderate   GOALS: Goals reviewed with patient? Yes  SHORT TERM GOALS: Target date: 12/25/23 Pt will be independent with initial HEP so she can maintain PT gains and increase functional capacity.  Baseline: Goal status: in progress   2.  Pt will complete  3 min walk test to document her baseline.  Baseline: 310 feet (12/21/23) Goal status: MET     LONG TERM GOALS: Target date: 01/22/24  Pt will be independent with advanced HEP so she can maintain PT gains and increase functional capacity.  Baseline:  Goal status: INITIAL  2.  Pt will perform 5 Sit to stands in < or = to 11 seconds or less without UE support so she is able to perform transfers independently.  Baseline: 19 seconds (12/17/23) Goal status: INITIAL  3.  Pt will score 4+/5 in all LE motions so she increases hip stability for improved functional capacity for navigating stairs.  Baseline:  Goal status: INITIAL  4.  Pt will complete Single limb stance for > or = to 10+ seconds bilaterally so she demonstrates improved balance for car transfers, and long standing.  Baseline:  Goal status: INITIAL  5.  Pt will complete TUG in less than or equal to 14 sec so she improves balance and coordination so she can go down stairs safely.  Baseline: 19 sec  Goal  status: INITIAL    PLAN:  PT FREQUENCY: 2x/week  PT DURATION: 8 weeks  PLANNED INTERVENTIONS: [id number]- PT Re-evaluation, 97750- Physical Performance Testing, 97110-Therapeutic exercises, 97530- Therapeutic activity, [id number]- Neuromuscular re-education, 97535- Self Care, [id number]- Manual therapy, [id number]- Gait training, [phone]- Orthotic/Prosthetic subsequent, [phone]- Canalith repositioning, [id number]- Aquatic Therapy, [phone]- Electrical stimulation (manual), [id number]- Ultrasound, [id number]- Traction (mechanical), [id number]- Ionotophoresis 4mg /ml Dexamethasone , [id number]- Parrafin, Patient/Family education, Balance training, Stair training, Taping, Joint mobilization, Spinal mobilization, Manual lymph drainage, Scar mobilization, Compression bandaging, Vestibular training, DME instructions, Wheelchair mobility training, Cryotherapy, and Moist heat  PLAN FOR NEXT SESSION: LE strengthening, flexibility, assess/update HEP to add standing tasks, balance tasks,  standing with weights or on balance pad.   Burnard Joy, PT 12/24/23 11:55 AM   [facility] [address], [address] [place], [place] [address] Phone # [phone] Fax [phone]

## 2023-12-28 ENCOUNTER — Ambulatory Visit

## 2023-12-28 DIAGNOSIS — M25562 Pain in left knee: Secondary | ICD-10-CM

## 2023-12-28 DIAGNOSIS — R252 Cramp and spasm: Secondary | ICD-10-CM

## 2023-12-28 DIAGNOSIS — R293 Abnormal posture: Secondary | ICD-10-CM

## 2023-12-28 DIAGNOSIS — R262 Difficulty in walking, not elsewhere classified: Secondary | ICD-10-CM

## 2023-12-28 DIAGNOSIS — M6281 Muscle weakness (generalized): Secondary | ICD-10-CM

## 2023-12-28 DIAGNOSIS — M19012 Primary osteoarthritis, left shoulder: Secondary | ICD-10-CM

## 2023-12-28 NOTE — Therapy (Signed)
 OUTPATIENT PHYSICAL THERAPY LOWER EXTREMITY Treatment   Patient Name: Tami Schmidt MRN: [id number] DOB:[birth year], 89 y.o., female Today's Date: 12/28/2023  END OF SESSION:  PT End of Session - 12/28/23 1105     Visit Number 9    Number of Visits 16    Date for PT Re-Evaluation 01/26/24    Authorization Type [employer]    Authorization Time Period 8/19 to 10/14- 16 visits    Authorization - Visit Number 9    Authorization - Number of Visits 16    PT Start Time 1105    PT Stop Time 1152    PT Time Calculation (min) 47 min    Activity Tolerance Patient tolerated treatment well    Behavior During Therapy WFL for tasks assessed/performed                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   Diabetes mellitus    fasting 90-100   HTN (hypertension) 03/25/2013   Hx of radiation therapy    breasts hx   Hypercholesterolemia    Hypertension    Lymphedema of arm    left arm   Pulmonary embolism (HCC)    Type II or unspecified type diabetes mellitus without mention of complication, not stated as uncontrolled 03/25/2013   Past Surgical History:  Procedure Laterality Date   ABDOMINAL HYSTERECTOMY     ANKLE SURGERY Right 1995   APPENDECTOMY     BIOPSY  01/07/2018   Procedure: BIOPSY;  Surgeon: Saintclair Jasper, MD;  Location: [facility];  Service: Gastroenterology;;   BREAST SURGERY Bilateral    mastectomy   COLONOSCOPY WITH PROPOFOL  N/A 01/07/2018   Procedure: COLONOSCOPY WITH PROPOFOL ;  Surgeon: Saintclair Jasper, MD;  Location: [facility];  Service: Gastroenterology;  Laterality: N/A;   ESOPHAGOGASTRODUODENOSCOPY (EGD) WITH PROPOFOL  N/A 01/06/2018   Procedure: ESOPHAGOGASTRODUODENOSCOPY (EGD) WITH PROPOFOL ;  Surgeon: Saintclair Jasper, MD;  Location: [facility];  Service: Gastroenterology;  Laterality: N/A;   HERNIA REPAIR  04-26-2010   IR  IMAGING GUIDED PORT INSERTION  10/29/2021   MASS EXCISION Left 01/26/2013   Procedure: EXCISION LEFT CHEST WALL MASS AND LEFT ABDOMNAL WALL MASS;  Surgeon: Vicenta DELENA Poli, MD;  Location: [facility];  Service: General;  Laterality: Left;   MASS EXCISION Left 09/20/2014   Procedure: EXCISION OF LEFT CHEST WALL MASS;  Surgeon: Vicenta Poli, MD;  Location: [facility];  Service: General;  Laterality: Left;   TEE WITHOUT CARDIOVERSION N/A 12/08/2017   Procedure: TRANSESOPHAGEAL ECHOCARDIOGRAM (TEE);  Surgeon: Pietro Redell RAMAN, MD;  Location: [facility];  Service: Cardiovascular;  Laterality: N/A;   TOTAL HIP ARTHROPLASTY Right 01/05/2017   TOTAL HIP ARTHROPLASTY Right 01/05/2017   Procedure: TOTAL HIP ARTHROPLASTY ANTERIOR APPROACH;  Surgeon: Liam Lerner, MD;  Location: [facility];  Service: Orthopedics;  Laterality: Right;   Patient Active Problem List   Diagnosis Date Noted   Acute lower GI bleeding 01/07/2023   Rectal bleeding 01/05/2023   Acute GI bleeding 10/04/2022   Port-A-Cath in place 06/12/2022   Loss of perception for taste 03/17/2022   Primary osteoarthritis involving multiple joints 03/17/2022   Genetic testing 07/24/2021   Microcytosis 04/29/2021   Hypokalemia 04/28/2021   Pulmonary embolism (HCC)    Agnosia 08/09/2020   Hardening of the aorta (main artery of the heart) (  HCC) 08/09/2020   Morbid obesity (HCC) 08/09/2020   Neuropathy 08/09/2020   Primary osteoarthritis 08/09/2020   Sensorineural hearing loss (SNHL) of both ears 08/07/2020   Anosmia 06/05/2020   Tinnitus, bilateral 06/05/2020   GI bleed 07/13/2019   ABLA (acute blood loss anemia) 07/12/2019   Microcytic anemia 01/05/2018   Melena 01/04/2018   Lower GI bleed 01/04/2018   Diabetes mellitus type II, controlled, with no complications (HCC) 12/05/2017   Tenosynovitis of left wrist 12/02/2017   Infection of left wrist (HCC) 11/30/2017   Primary osteoarthritis of right hip 01/05/2017    Osteoarthritis of right hip 01/03/2017   Lymphedema of upper extremity 01/17/2014   Osteopenia 01/17/2014   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   Dilated pore of [place] 08/02/2013   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 08/02/2013   Scar 08/02/2013   Malignant neoplasm of lower-inner quadrant of left breast in female, estrogen receptor positive (HCC) 06/16/2013   Type II or unspecified type diabetes mellitus without mention of complication, not stated as uncontrolled 03/25/2013   Essential hypertension 03/25/2013   Chest wall recurrence of breast cancer (HCC) 02/21/2013   Abdominal wall mass 01/14/2013    PCP: Following with cancer center   REFERRING PROVIDER: Joane Birmingham, MD  REFERRING DIAG: Chronic pain of both shoulders, Chronic pain of left knee, Balance problem   THERAPY DIAG:  Abnormal posture  Difficulty in walking, not elsewhere classified  Muscle weakness (generalized)  Primary osteoarthritis of shoulders, bilateral  Left knee pain, unspecified chronicity  Cramp and spasm  Rationale for Evaluation and Treatment: Rehabilitation  ONSET DATE: 3-4 years ago  SUBJECTIVE:   SUBJECTIVE STATEMENT: Patient states she fell 2 days ago while reaching into cabinets in her kitchen.  Felt like her knees just gave way.    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 Pts husband has wrapped before and she has a night garment  PAIN:   12/28/23  0/10 pain in L knee Are you having pain? Yes: Pain location: bil shoulders Pain description: ache, didn't rate the pain Aggravating factors: lifting arms to side or forward  Relieving factors: not moving it  PRECAUTIONS: None  RED FLAGS: None   WEIGHT BEARING RESTRICTIONS: No  FALLS:  Has patient fallen in last 6 months? Yes. Number of falls 1- PT will address falls in PT treatment   LIVING ENVIRONMENT: Lives with: lives with their family and lives with their spouse Lives in: House/apartment Stairs: Yes: Internal: 16 steps; on right going up Has following equipment at home: Single point cane  OCCUPATION: Retired, husband is the Financial trader, read   PLOF: Independent  PATIENT GOALS: Walk up the steps with both feet, strengthen both knees  NEXT MD VISIT: Sees Oncologist in 1 month  OBJECTIVE:  Note: Objective measures were completed at Evaluation unless otherwise noted.  DIAGNOSTIC FINDINGS:   PATIENT SURVEYS:  LEFS  Extreme difficulty/unable (0), Quite a bit of difficulty (1), Moderate difficulty (2), Little difficulty (3), No difficulty (4) Survey date:  12/01/23  Any of your usual work, housework or school activities 3  2. Usual hobbies, recreational or sporting activities 3  3. Getting into/out of the bath 3  4. Walking between rooms 3  5. Putting on socks/shoes 3  6. Squatting  0  7. Lifting an object, like a bag of groceries from the floor 2  8. Performing light activities around your home 3  9. Performing heavy activities around your home 1  10. Getting into/out of a car 4  11. Walking 2 blocks 3  12. Walking 1 mile 0  13. Going up/down 10 stairs (1 flight) 3  14. Standing for 1 hour 2  15.  sitting for 1 hour 4  16. Running on even ground 0  17. Running on uneven ground 0   18. Making sharp turns while running fast 0  19. Hopping  0  20. Rolling over in bed 3  Score total:  40/80     COGNITION: Overall cognitive status: Within functional limits for tasks assessed     SENSATION: Not tested Neuropathy in bilateral feet at night , why pt takes gabapentin     MUSCLE LENGTH: Hamstrings: Right side tightness; Left side tightness   POSTURE: rounded shoulders, forward head, and flexed trunk   PALPATION: NA  UPPER EXTREMITY ROM: Shoulder strength: 4/5 shoulder flexion and extension, abduction 4-/5, IR/ER 3+/5 tested in neutral Shoulder mobility: limited 60% in bilateral shoul flexion/Abd/ER/IR with pain and weakness reported   LOWER EXTREMITY ROM:  Active ROM Right eval Left eval  Hip flexion WNL WNL  Hip extension Dec ROM Dec Rom  Hip abduction Dec ROM Dec ROM  Hip adduction    Hip internal rotation Dec ROM Dec ROM  Hip external rotation Dec ROM Dec ROM  Knee flexion Dec ROM Dec ROM  Knee extension Dec ROM Dec ROM  Ankle dorsiflexion    Ankle plantarflexion    Ankle inversion    Ankle eversion     (Blank rows = not tested)  LOWER EXTREMITY MMT:  MMT Right eval Left eval  Hip flexion 4- 3+  Hip extension 4- 3+  Hip abduction 4- 3+  Hip adduction    Hip internal rotation 4- 4-  Hip external rotation 4- 4-  Knee flexion 4 3+  Knee extension 4- 3+  Ankle dorsiflexion    Ankle plantarflexion    Ankle inversion    Ankle eversion     (Blank rows = not tested)  LOWER EXTREMITY SPECIAL TESTS:    FUNCTIONAL TESTS:  8/19/255 times sit to stand: 15.27 sec used UE support  Timed up and go (TUG): 19.27 no cane; 19.00 sec with cane  3 minute walk test: not complete  12/17/23:  5x sit to stand: 19.73 seconds  TUG: 19.84 seconds   12/21/23:  3 min walk: 310 feet with 3/10 RPE  GAIT: Distance walked: 300 ft  Assistive device utilized: Single point cane Level of assistance: Min A, genu varus and antalgic gait. Comments: Pt walked  from wait room across ortho gym to private room and back  TREATMENT DATE:  12/28/23: Nustep - 6 mins L5 - PT present to discuss progress  Seated LAQ 4# 2x10 Seated march x 20 Seated ER with 4# seated 2 x 10 each  Sit to stand x 5 with heavy vc's for weight shift fwd Seated resisted hamstring curls with red band (PT anchoring) 2 x 10 Standing speed squats 2 x 10 Standing squat hold x 5 holding 10 sec Educated on appropriateness of using rollator walker due to her end stage knee condition to avoid falls and allow her more ease of movement.  Explained that the right glut medius weakness is also contributing to her balance issues.  A bad fall would set her way back.  Ok to use cane in home but if she is going out for a while, she would definitely be safer with the walker and would likely be more willing to go out and about because she is less fearful.    12/24/23: Nustep - 6 mins L5 - PT present to discuss progress  Seated LAQ 4# 2x10 ER with 4# seated x10 each  Standing heel raises  x 20  Weight shift on balance pad 3 ways x 1 min each  Standing marching 4#  x20 with bil UE support  Seated hip adduction with ball: 5 hold x20 Standing hip ABD and extension 2x10 Bil on balance pad Seated HS stretch 2x20 Step tap on 6 step with 4# ankle weights x20 Hurdles: forward and lateral with step to pattern.  Use of Ues for safety, 4 laps each   12/21/23: Nustep - 6 mins L5 - PT present to discuss progress  3 min walk test with cane: 310 feet with 3/10 RPE Step ups fwd on 4 inch 2x10, lateral 1x10, attempted 6 inch but uses UE to pull Seated LAQ 4# 2x10 ER with 4# seated x10 each  Standing heel raises  x 20  Weight shift on balance pad 3 ways x 1 min each  Standing marching 4#  x20 with bil UE support  Seated hip adduction with ball: 5 hold x20 Standing hip ABD and  extension 2x10 Bil on balance pad Seated HS stretch 2x20 Step up on 4 step Rt and Lt 2x10   PATIENT EDUCATION:  Education details: [email or web address] Person educated: Patient Education method: Programmer, multimedia, Demonstration, Actor cues, Verbal cues, and Handouts Education comprehension: verbalized understanding, returned demonstration, and verbal cues required  HOME EXERCISE PROGRAM: Access Code: [email or web address] URL: [email or web address] Date: 12/07/2023 Prepared by: Burnard  Exercises - Seated Hip Abduction with Resistance  - 2-3 x daily - 7 x weekly - 1-2 sets - 10 reps - Seated Quad Set  - 2-3 x daily - 7 x weekly - 1-2 sets - 5 reps - 5 hold - Seated Heel Toe Raises  - 3 x daily - 7 x weekly - 2 sets - 10 reps - Sit to Stand with Armchair  - 2 x daily - 7 x weekly - 2 sets - 5 reps - Seated Long Arc Quad  - 2 x daily - 7 x weekly - 1 sets - 10 reps - Standing March with Counter Support  - 2 x daily - 7 x weekly - 10 reps - Seated Hip Adduction Isometrics with Ball  - 2 x daily - 7 x weekly - 1 sets - 10 reps - 5 hold   ASSESSMENT:  CLINICAL IMPRESSION: Patient had a fall since last visit.  She did not report any injury.  We tried cane in left  hand today to see if this would eliminate the trendelenburg and it did but concern would be for the right knee getting over worked.  She would be safest, for her age range and for her end stage OA, to use rollator walker.  She does not seem to do many of her standing exercises at home.  Encouraged her to try and do some of the more difficult exercises like sit to stand to build functional strength.   Patient will benefit from skilled PT to address the below impairments and improve overall function.   OBJECTIVE IMPAIRMENTS: Abnormal gait, cardiopulmonary status limiting activity, decreased activity tolerance, decreased balance, decreased coordination, decreased endurance, decreased knowledge of condition, decreased mobility, difficulty walking,  decreased ROM, decreased strength, impaired sensation, impaired UE functional use, improper body mechanics, postural dysfunction, and pain.   ACTIVITY LIMITATIONS: carrying, lifting, bending, standing, squatting, stairs, transfers, toileting, reach over head, and caring for others  PARTICIPATION LIMITATIONS: meal prep, cleaning, laundry, and yard work  PERSONAL FACTORS: Age, Fitness, Social background, Time since onset of injury/illness/exacerbation, and 3+ comorbidities:    REHAB POTENTIAL: Good  CLINICAL DECISION MAKING: Evolving/moderate complexity  EVALUATION COMPLEXITY: Moderate   GOALS: Goals reviewed with patient? Yes  SHORT TERM GOALS: Target date: 12/25/23 Pt will be independent with initial HEP so she can maintain PT gains and increase functional capacity.  Baseline: Goal status: in progress   2.  Pt will complete  3 min walk test to document her baseline.  Baseline: 310 feet (12/21/23) Goal status: MET     LONG TERM GOALS: Target date: 01/22/24  Pt will be independent with advanced HEP so she can maintain PT gains and increase functional capacity.  Baseline:  Goal status: INITIAL  2.  Pt will perform 5 Sit to stands in < or = to 11 seconds or less without UE support so she is able to perform transfers independently.  Baseline: 19 seconds (12/17/23) Goal status: INITIAL  3.  Pt will score 4+/5 in all LE motions so she increases hip stability for improved functional capacity for navigating stairs.  Baseline:  Goal status: INITIAL  4.  Pt will complete Single limb stance for > or = to 10+ seconds bilaterally so she demonstrates improved balance for car transfers, and long standing.  Baseline:  Goal status: INITIAL  5.  Pt will complete TUG in less than or equal to 14 sec so she improves balance and coordination so she can go down stairs safely.  Baseline: 19 sec  Goal status: INITIAL    PLAN:  PT FREQUENCY: 2x/week  PT DURATION: 8 weeks  PLANNED  INTERVENTIONS: [id number]- PT Re-evaluation, 97750- Physical Performance Testing, 97110-Therapeutic exercises, 97530- Therapeutic activity, [id number]- Neuromuscular re-education, 97535- Self Care, [id number]- Manual therapy, [id number]- Gait training, [id number]- Orthotic/Prosthetic subsequent, [phone]- Canalith repositioning, [id number]- Aquatic Therapy, [phone]- Electrical stimulation (manual), [id number]- Ultrasound, [id number]- Traction (mechanical), [id number]- Ionotophoresis 4mg /ml Dexamethasone , [id number]- Parrafin, Patient/Family education, Balance training, Stair training, Taping, Joint mobilization, Spinal mobilization, Manual lymph drainage, Scar mobilization, Compression bandaging, Vestibular training, DME instructions, Wheelchair mobility training, Cryotherapy, and Moist heat  PLAN FOR NEXT SESSION: LE strengthening, flexibility, assess/update HEP to add standing tasks, balance tasks,  standing with weights or on balance pad.   Tami Schmidt, PT 12/28/23 12:35 PM [facility] [address] [place], [place] [address] Phone # [phone] Fax [phone]

## 2023-12-29 ENCOUNTER — Ambulatory Visit: Admitting: Family Medicine

## 2023-12-31 ENCOUNTER — Ambulatory Visit

## 2023-12-31 DIAGNOSIS — M19011 Primary osteoarthritis, right shoulder: Secondary | ICD-10-CM

## 2023-12-31 DIAGNOSIS — M6281 Muscle weakness (generalized): Secondary | ICD-10-CM

## 2023-12-31 DIAGNOSIS — M25562 Pain in left knee: Secondary | ICD-10-CM

## 2023-12-31 DIAGNOSIS — R252 Cramp and spasm: Secondary | ICD-10-CM

## 2023-12-31 DIAGNOSIS — R293 Abnormal posture: Secondary | ICD-10-CM

## 2023-12-31 DIAGNOSIS — R262 Difficulty in walking, not elsewhere classified: Secondary | ICD-10-CM

## 2023-12-31 NOTE — Therapy (Signed)
 OUTPATIENT PHYSICAL THERAPY LOWER EXTREMITY TREATMENT Progress Note Reporting Period 12/01/23 to 12/31/23  See note below for Objective Data and Assessment of Progress/Goals.       Patient Name: Tami Schmidt MRN: [id number] DOB:Jul 11, 1932, 89 y.o., female Today's Date: 12/31/2023  END OF SESSION:  PT End of Session - 12/31/23 1022     Visit Number 10    Number of Visits 16    Date for Recertification  01/26/24    Authorization Type [employer]    Authorization Time Period 8/19 to 10/14- 16 visits    Authorization - Visit Number 10    Authorization - Number of Visits 16    Progress Note Due on Visit 20    PT Start Time 1018    Activity Tolerance Patient tolerated treatment well    Behavior During Therapy [facility] for tasks assessed/performed                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   Diabetes mellitus    fasting 90-100   HTN (hypertension) 03/25/2013   Hx of radiation therapy    breasts hx   Hypercholesterolemia    Hypertension    Lymphedema of arm    left arm   Pulmonary embolism (HCC)    Type II or unspecified type diabetes mellitus without mention of complication, not stated as uncontrolled 03/25/2013   Past Surgical History:  Procedure Laterality Date   ABDOMINAL HYSTERECTOMY     ANKLE SURGERY Right 1995   APPENDECTOMY     BIOPSY  01/07/2018   Procedure: BIOPSY;  Surgeon: Saintclair Jasper, MD;  Location: [facility];  Service: Gastroenterology;;   BREAST SURGERY Bilateral    mastectomy   COLONOSCOPY WITH PROPOFOL  N/A 01/07/2018   Procedure: COLONOSCOPY WITH PROPOFOL ;  Surgeon: Saintclair Jasper, MD;  Location: [facility];  Service: Gastroenterology;  Laterality: N/A;   ESOPHAGOGASTRODUODENOSCOPY (EGD) WITH PROPOFOL  N/A 01/06/2018   Procedure: ESOPHAGOGASTRODUODENOSCOPY (EGD) WITH PROPOFOL ;  Surgeon: Saintclair Jasper, MD;   Location: [facility];  Service: Gastroenterology;  Laterality: N/A;   HERNIA REPAIR  04-26-2010   IR IMAGING GUIDED PORT INSERTION  10/29/2021   MASS EXCISION Left 01/26/2013   Procedure: EXCISION LEFT CHEST WALL MASS AND LEFT ABDOMNAL WALL MASS;  Surgeon: Vicenta DELENA Poli, MD;  Location: [facility];  Service: General;  Laterality: Left;   MASS EXCISION Left 09/20/2014   Procedure: EXCISION OF LEFT CHEST WALL MASS;  Surgeon: Vicenta Poli, MD;  Location: [facility];  Service: General;  Laterality: Left;   TEE WITHOUT CARDIOVERSION N/A 12/08/2017   Procedure: TRANSESOPHAGEAL ECHOCARDIOGRAM (TEE);  Surgeon: Pietro Redell RAMAN, MD;  Location: [facility];  Service: Cardiovascular;  Laterality: N/A;   TOTAL HIP ARTHROPLASTY Right 01/05/2017   TOTAL HIP ARTHROPLASTY Right 01/05/2017   Procedure: TOTAL HIP ARTHROPLASTY ANTERIOR APPROACH;  Surgeon: Liam Lerner, MD;  Location: [facility];  Service: Orthopedics;  Laterality: Right;   Patient Active Problem List   Diagnosis Date Noted   Acute lower GI bleeding 01/07/2023   Rectal bleeding 01/05/2023   Acute GI bleeding 10/04/2022   Port-A-Cath in place 06/12/2022   Loss of perception for taste 03/17/2022   Primary osteoarthritis involving multiple joints 03/17/2022   Genetic testing 07/24/2021   Microcytosis 04/29/2021   Hypokalemia 04/28/2021   Pulmonary embolism (HCC)  Agnosia 08/09/2020   Hardening of the aorta (main artery of the heart) (HCC) 08/09/2020   Morbid obesity (HCC) 08/09/2020   Neuropathy 08/09/2020   Primary osteoarthritis 08/09/2020   Sensorineural hearing loss (SNHL) of both ears 08/07/2020   Anosmia 06/05/2020   Tinnitus, bilateral 06/05/2020   GI bleed 07/13/2019   ABLA (acute blood loss anemia) 07/12/2019   Microcytic anemia 01/05/2018   Melena 01/04/2018   Lower GI bleed 01/04/2018   Diabetes mellitus type II, controlled, with no complications (HCC) 12/05/2017   Tenosynovitis of left wrist 12/02/2017    Infection of left wrist (HCC) 11/30/2017   Primary osteoarthritis of right hip 01/05/2017   Osteoarthritis of right hip 01/03/2017   Lymphedema of upper extremity 01/17/2014   Osteopenia 01/17/2014   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   Dilated pore of Winer 08/02/2013   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 08/02/2013   Scar 08/02/2013   Malignant neoplasm of lower-inner quadrant of left breast in female, estrogen receptor positive (HCC) 06/16/2013   Type II or unspecified type diabetes mellitus without mention of complication, not stated as uncontrolled 03/25/2013   Essential hypertension 03/25/2013   Chest wall recurrence of breast cancer (HCC) 02/21/2013   Abdominal wall mass 01/14/2013    PCP: Following with cancer center   REFERRING PROVIDER: Joane Birmingham, MD  REFERRING DIAG: Chronic pain of both shoulders, Chronic pain of left knee, Balance problem   THERAPY DIAG:  Difficulty in walking, not elsewhere classified  Muscle weakness (generalized)  Cramp and spasm  Left knee pain, unspecified chronicity  Primary osteoarthritis of shoulders, bilateral  Abnormal posture  Rationale for Evaluation and Treatment: Rehabilitation  ONSET DATE: 3-4 years ago  SUBJECTIVE:   SUBJECTIVE STATEMENT: Patient reports no issues or problems.  It don't help to complain.      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 Pts husband has wrapped before and she has a night garment  PAIN:   12/31/23   0/10 pain in L knee Are you having pain? Yes: Pain location: bil shoulders Pain description: ache, didn't rate the pain Aggravating factors: lifting arms to side or forward  Relieving factors: not moving it  PRECAUTIONS: None  RED FLAGS: None   WEIGHT BEARING RESTRICTIONS: No  FALLS:  Has patient fallen in last 6 months? Yes. Number of falls 1- PT will address falls in PT treatment   LIVING ENVIRONMENT: Lives with: lives with their family and lives with their spouse Lives in: House/apartment Stairs: Yes: Internal: 16 steps; on right going up Has following equipment at home: Single point cane  OCCUPATION: Retired, husband is the Financial trader, read   PLOF: Independent  PATIENT GOALS: Walk up the steps with both feet, strengthen both knees  NEXT MD VISIT: Sees Oncologist in 1 month  OBJECTIVE:  Note: Objective measures were completed at Evaluation unless otherwise noted.  DIAGNOSTIC FINDINGS:   PATIENT SURVEYS:  LEFS  Extreme difficulty/unable (0), Quite a bit of difficulty (1), Moderate difficulty (2), Little difficulty (3), No difficulty (4) Survey date:  12/01/23  Any of your usual work, housework or school activities 3  2. Usual hobbies, recreational or sporting activities 3  3. Getting into/out of the bath 3  4. Walking between rooms 3  5. Putting on socks/shoes 3  6. Squatting  0  7. Lifting an object, like a bag of groceries from the floor 2  8. Performing light activities around your home 3  9. Performing heavy activities around your home 1  10. Getting into/out of a car 4  11. Walking 2 blocks 3  12. Walking 1 mile 0  13. Going up/down 10 stairs (1 flight) 3  14. Standing for 1 hour 2  15.  sitting for 1 hour 4  16. Running on even ground 0   17. Running on uneven ground 0  18. Making sharp turns while running fast 0  19. Hopping  0  20. Rolling over in bed 3  Score total:  40/80     COGNITION: Overall cognitive status: Within functional limits for tasks assessed     SENSATION: Not tested Neuropathy in bilateral feet at night , why pt takes gabapentin     MUSCLE LENGTH: Hamstrings: Right side tightness; Left side tightness   POSTURE: rounded shoulders, forward head, and flexed trunk   PALPATION: NA  UPPER EXTREMITY ROM: Shoulder strength: 4/5 shoulder flexion and extension, abduction 4-/5, IR/ER 3+/5 tested in neutral Shoulder mobility: limited 60% in bilateral shoul flexion/Abd/ER/IR with pain and weakness reported   LOWER EXTREMITY ROM:  Active ROM Right eval Left eval  Hip flexion WNL WNL  Hip extension Dec ROM Dec Rom  Hip abduction Dec ROM Dec ROM  Hip adduction    Hip internal rotation Dec ROM Dec ROM  Hip external rotation Dec ROM Dec ROM  Knee flexion Dec ROM Dec ROM  Knee extension Dec ROM Dec ROM  Ankle dorsiflexion    Ankle plantarflexion    Ankle inversion    Ankle eversion     (Blank rows = not tested)  LOWER EXTREMITY MMT:  MMT Right eval Left eval  Hip flexion 4- 3+  Hip extension 4- 3+  Hip abduction 4- 3+  Hip adduction    Hip internal rotation 4- 4-  Hip external rotation 4- 4-  Knee flexion 4 3+  Knee extension 4- 3+  Ankle dorsiflexion    Ankle plantarflexion    Ankle inversion    Ankle eversion     (Blank rows = not tested)  LOWER EXTREMITY SPECIAL TESTS:    FUNCTIONAL TESTS:  8/19/255 times sit to stand: 15.27 sec used UE support  Timed up and go (TUG): 19.27 no cane; 19.00 sec with cane  3 minute walk test: not complete  12/17/23:  5x sit to stand: 19.73 seconds  TUG: 19.84 seconds   12/21/23:  3 min walk: 310 feet with 3/10 RPE  GAIT: Distance walked: 300 ft  Assistive device utilized: Single point cane Level of assistance: Min A, genu varus and  antalgic gait. Comments: Pt walked from wait room across ortho gym to private room and back  TREATMENT DATE:  12/31/23: Nustep - 6 mins L5 - PT present to discuss progress  Seated LAQ 4# 2x10 Seated march x 20 Seated ER with 4# seated 2 x 10 each  Seated clam with yellow loop x 20 Sit to stand 2 x 5 with yellow loop around knees for encouraging hip abduction and proper alignment Seated resisted hamstring curls with red band (PT anchoring) 2 x 10 Supine bridge 2 x 10 (vc's to place weight at the ball of the foot to avoid hamstring cramps) Supine SLR 2 x 10 (vc's for correct technique and to do short range) Supine hip abduction with towel under heel x 20 each LE Supine heel slides with towel under heel x 20 each LE Side lying clam x 20 each side Seated bicep curls with 2 lbs x 20 each Seated punches with 1# x 20 Seated tricep press with green tband 2 x 10 each side  12/28/23: Nustep - 6 mins L5 - PT present to discuss progress  Seated LAQ 4# 2x10 Seated march x 20 Seated ER with 4# seated 2 x 10 each  Sit to stand x 5 with heavy vc's for weight shift fwd Seated resisted hamstring curls with red band (PT anchoring) 2 x 10 Standing speed squats 2 x 10 Standing squat hold x 5 holding 10 sec Educated on appropriateness of using rollator walker due to her end stage knee condition to avoid falls and allow her more ease of movement.  Explained that the right glut medius weakness is also contributing to her balance issues.  A bad fall would set her way back.  Ok to use cane in home but if she is going out for a while, she would definitely be safer with the walker and would likely be more willing to go out and about because she is less fearful.    12/24/23: Nustep - 6 mins L5 - PT present to discuss progress  Seated LAQ 4# 2x10 ER with 4# seated x10 each  Standing heel  raises  x 20  Weight shift on balance pad 3 ways x 1 min each  Standing marching 4#  x20 with bil UE support  Seated hip adduction with ball: 5 hold x20 Standing hip ABD and extension 2x10 Bil on balance pad Seated HS stretch 2x20 Step tap on 6 step with 4# ankle weights x20 Hurdles: forward and lateral with step to pattern.  Use of Ues for safety, 4 laps each    PATIENT EDUCATION:  Education details: [email or web address] Person educated: Patient Education method: Explanation, Demonstration, Tactile cues, Verbal cues, and Handouts Education comprehension: verbalized understanding, returned demonstration, and verbal cues required  HOME EXERCISE PROGRAM: Access Code: [email or web address] URL: [email or web address] Date: 12/07/2023 Prepared by: Burnard  Exercises - Seated Hip Abduction with Resistance  - 2-3 x daily - 7 x weekly - 1-2 sets - 10 reps - Seated Quad Set  - 2-3 x daily - 7 x weekly - 1-2 sets - 5 reps - 5 hold - Seated Heel Toe Raises  - 3 x daily - 7 x weekly - 2 sets - 10 reps - Sit to Stand with Armchair  - 2 x daily - 7 x weekly - 2 sets - 5 reps - Seated Long Arc Quad  - 2 x daily - 7 x weekly - 1 sets - 10 reps - Standing March with Counter Support  - 2 x daily - 7 x weekly - 10 reps - Seated Hip Adduction Isometrics with Mercer  -  2 x daily - 7 x weekly - 1 sets - 10 reps - 5 hold   ASSESSMENT:  CLINICAL IMPRESSION: Patient is progressing appropriately.   She follows instructions very well and her technique is good with min vc's.  She struggles with UE strengthening due to ROM issues and min RC strength due to radiation.  She is well motivated and compliant.  She should continue to do well.   Patient will benefit from skilled PT to address the below impairments and improve overall function.   OBJECTIVE IMPAIRMENTS: Abnormal gait, cardiopulmonary status limiting activity, decreased activity tolerance, decreased balance, decreased coordination, decreased endurance, decreased  knowledge of condition, decreased mobility, difficulty walking, decreased ROM, decreased strength, impaired sensation, impaired UE functional use, improper body mechanics, postural dysfunction, and pain.   ACTIVITY LIMITATIONS: carrying, lifting, bending, standing, squatting, stairs, transfers, toileting, reach over head, and caring for others  PARTICIPATION LIMITATIONS: meal prep, cleaning, laundry, and yard work  PERSONAL FACTORS: Age, Fitness, Social background, Time since onset of injury/illness/exacerbation, and 3+ comorbidities:    REHAB POTENTIAL: Good  CLINICAL DECISION MAKING: Evolving/moderate complexity  EVALUATION COMPLEXITY: Moderate   GOALS: Goals reviewed with patient? Yes  SHORT TERM GOALS: Target date: 12/25/23 Pt will be independent with initial HEP so she can maintain PT gains and increase functional capacity.  Baseline: Goal status: in progress   2.  Pt will complete  3 min walk test to document her baseline.  Baseline: 310 feet (12/21/23) Goal status: MET     LONG TERM GOALS: Target date: 01/22/24  Pt will be independent with advanced HEP so she can maintain PT gains and increase functional capacity.  Baseline:  Goal status: INITIAL  2.  Pt will perform 5 Sit to stands in < or = to 11 seconds or less without UE support so she is able to perform transfers independently.  Baseline: 19 seconds (12/17/23) Goal status: INITIAL  3.  Pt will score 4+/5 in all LE motions so she increases hip stability for improved functional capacity for navigating stairs.  Baseline:  Goal status: INITIAL  4.  Pt will complete Single limb stance for > or = to 10+ seconds bilaterally so she demonstrates improved balance for car transfers, and long standing.  Baseline:  Goal status: INITIAL  5.  Pt will complete TUG in less than or equal to 14 sec so she improves balance and coordination so she can go down stairs safely.  Baseline: 19 sec  Goal status:  INITIAL    PLAN:  PT FREQUENCY: 2x/week  PT DURATION: 8 weeks  PLANNED INTERVENTIONS: [id number]- PT Re-evaluation, 97750- Physical Performance Testing, 97110-Therapeutic exercises, 97530- Therapeutic activity, [id number]- Neuromuscular re-education, 97535- Self Care, [id number]- Manual therapy, [id number]- Gait training, [phone]- Orthotic/Prosthetic subsequent, [phone]- Canalith repositioning, [id number]- Aquatic Therapy, [phone]- Electrical stimulation (manual), [id number]- Ultrasound, [id number]- Traction (mechanical), [id number]- Ionotophoresis 4mg /ml Dexamethasone , 02981- Parrafin, Patient/Family education, Balance training, Stair training, Taping, Joint mobilization, Spinal mobilization, Manual lymph drainage, Scar mobilization, Compression bandaging, Vestibular training, DME instructions, Wheelchair mobility training, Cryotherapy, and Moist heat  PLAN FOR NEXT SESSION: Incorporate more UE strengthening,  No arms on Nustep due to lymphedema,  LE strengthening, flexibility, assess/update HEP to add standing tasks, balance tasks,  standing with weights or on balance pad.   Delon B. Nickson [place], PT 12/31/23 11:17 AM [facility] [address], [address] [place], [place] [address] Phone # [phone] Fax [phone]

## 2024-01-04 ENCOUNTER — Encounter

## 2024-01-04 NOTE — Therapy (Signed)
 OUTPATIENT PHYSICAL THERAPY LOWER EXTREMITY TREATMENT       Patient Name: Tami Schmidt MRN: [id number] DOB:Aug 16, 1932, 89 y.o., female Today's Date: 01/05/2024  END OF SESSION:  PT End of Session - 01/05/24 0934     Visit Number 11    Number of Visits 16    Date for Recertification  01/26/24    Authorization Type [employer]    Authorization Time Period 8/19 to 10/14- 16 visits    Authorization - Visit Number 11    Authorization - Number of Visits 16    Progress Note Due on Visit 20    PT Start Time 0932    PT Stop Time 1018    PT Time Calculation (min) 46 min    Activity Tolerance Patient tolerated treatment well    Behavior During Therapy WFL for tasks assessed/performed                 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   Diabetes mellitus    fasting 90-100   HTN (hypertension) 03/25/2013   Hx of radiation therapy    breasts hx   Hypercholesterolemia    Hypertension    Lymphedema of arm    left arm   Pulmonary embolism (HCC)    Type II or unspecified type diabetes mellitus without mention of complication, not stated as uncontrolled 03/25/2013   Past Surgical History:  Procedure Laterality Date   ABDOMINAL HYSTERECTOMY     ANKLE SURGERY Right 1995   APPENDECTOMY     BIOPSY  01/07/2018   Procedure: BIOPSY;  Surgeon: Saintclair Jasper, MD;  Location: [facility];  Service: Gastroenterology;;   BREAST SURGERY Bilateral    mastectomy   COLONOSCOPY WITH PROPOFOL  N/A 01/07/2018   Procedure: COLONOSCOPY WITH PROPOFOL ;  Surgeon: Saintclair Jasper, MD;  Location: [facility];  Service: Gastroenterology;  Laterality: N/A;   ESOPHAGOGASTRODUODENOSCOPY (EGD) WITH PROPOFOL  N/A 01/06/2018   Procedure: ESOPHAGOGASTRODUODENOSCOPY (EGD) WITH PROPOFOL ;  Surgeon: Saintclair Jasper, MD;  Location: [facility];  Service: Gastroenterology;  Laterality:  N/A;   HERNIA REPAIR  04-26-2010   IR IMAGING GUIDED PORT INSERTION  10/29/2021   MASS EXCISION Left 01/26/2013   Procedure: EXCISION LEFT CHEST WALL MASS AND LEFT ABDOMNAL WALL MASS;  Surgeon: Vicenta DELENA Poli, MD;  Location: [facility];  Service: General;  Laterality: Left;   MASS EXCISION Left 09/20/2014   Procedure: EXCISION OF LEFT CHEST WALL MASS;  Surgeon: Vicenta Poli, MD;  Location: [facility];  Service: General;  Laterality: Left;   TEE WITHOUT CARDIOVERSION N/A 12/08/2017   Procedure: TRANSESOPHAGEAL ECHOCARDIOGRAM (TEE);  Surgeon: Pietro Redell RAMAN, MD;  Location[facility];  Service: Cardiovascular;  Laterality: N/A;   TOTAL HIP ARTHROPLASTY Right 01/05/2017   TOTAL HIP ARTHROPLASTY Right 01/05/2017   Procedure: TOTAL HIP ARTHROPLASTY ANTERIOR APPROACH;  Surgeon: Liam Lerner, MD;  Location: [facility];  Service: Orthopedics;  Laterality: Right;   Patient Active Problem List   Diagnosis Date Noted   Acute lower GI bleeding 01/07/2023   Rectal bleeding 01/05/2023   Acute GI bleeding 10/04/2022   Port-A-Cath in place 06/12/2022   Loss of perception for taste 03/17/2022   Primary osteoarthritis involving multiple joints 03/17/2022   Genetic testing 07/24/2021   Microcytosis 04/29/2021   Hypokalemia 04/28/2021   Pulmonary embolism (HCC)  Agnosia 08/09/2020   Hardening of the aorta (main artery of the heart) 08/09/2020   Morbid obesity (HCC) 08/09/2020   Neuropathy 08/09/2020   Primary osteoarthritis 08/09/2020   Sensorineural hearing loss (SNHL) of both ears 08/07/2020   Anosmia 06/05/2020   Tinnitus, bilateral 06/05/2020   GI bleed 07/13/2019   ABLA (acute blood loss anemia) 07/12/2019   Microcytic anemia 01/05/2018   Melena 01/04/2018   Lower GI bleed 01/04/2018   Diabetes mellitus type II, controlled, with no complications (HCC) 12/05/2017   Tenosynovitis of left wrist 12/02/2017   Infection of left wrist (HCC) 11/30/2017   Primary osteoarthritis of  right hip 01/05/2017   Osteoarthritis of right hip 01/03/2017   Lymphedema of upper extremity 01/17/2014   Osteopenia 01/17/2014   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   Dilated pore of Winer 08/02/2013   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 08/02/2013   Scar 08/02/2013   Malignant neoplasm of lower-inner quadrant of left breast in female, estrogen receptor positive (HCC) 06/16/2013   Type II or unspecified type diabetes mellitus without mention of complication, not stated as uncontrolled 03/25/2013   Essential hypertension 03/25/2013   Chest wall recurrence of breast cancer (HCC) 02/21/2013   Abdominal wall mass 01/14/2013    PCP: Following with cancer center   REFERRING PROVIDER: Joane Birmingham, MD  REFERRING DIAG: Chronic pain of both shoulders, Chronic pain of left knee, Balance problem   THERAPY DIAG:  Difficulty in walking, not elsewhere classified  Muscle weakness (generalized)  Cramp and spasm  Left knee pain, unspecified chronicity  Rationale for Evaluation and Treatment: Rehabilitation  ONSET DATE: 3-4 years ago  SUBJECTIVE:   SUBJECTIVE STATEMENT: I'm so stiff this week.     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 Pts husband has wrapped before and she has a  night garment  PAIN:   12/31/23   0/10 pain in L knee Are you having pain? Yes: Pain location: bil shoulders Pain description: ache, didn't rate the pain Aggravating factors: lifting arms to side or forward  Relieving factors: not moving it  PRECAUTIONS: None  RED FLAGS: None   WEIGHT BEARING RESTRICTIONS: No  FALLS:  Has patient fallen in last 6 months? Yes. Number of falls 1- PT will address falls in PT treatment   LIVING ENVIRONMENT: Lives with: lives with their family and lives with their spouse Lives in: House/apartment Stairs: Yes: Internal: 16 steps; on right going up Has following equipment at home: Single point cane  OCCUPATION: Retired, husband is the Financial trader, read   PLOF: Independent  PATIENT GOALS: Walk up the steps with both feet, strengthen both knees  NEXT MD VISIT: Sees Oncologist in 1 month  OBJECTIVE:  Note: Objective measures were completed at Evaluation unless otherwise noted.  DIAGNOSTIC FINDINGS:   PATIENT SURVEYS:  LEFS  Extreme difficulty/unable (0), Quite a bit of difficulty (1),  Moderate difficulty (2), Little difficulty (3), No difficulty (4) Survey date:  12/01/23  Any of your usual work, housework or school activities 3  2. Usual hobbies, recreational or sporting activities 3  3. Getting into/out of the bath 3  4. Walking between rooms 3  5. Putting on socks/shoes 3  6. Squatting  0  7. Lifting an object, like a bag of groceries from the floor 2  8. Performing light activities around your home 3  9. Performing heavy activities around your home 1  10. Getting into/out of a car 4  11. Walking 2 blocks 3  12. Walking 1 mile 0  13. Going up/down 10 stairs (1 flight) 3  14. Standing for 1 hour 2  15.  sitting for 1 hour 4  16. Running on even ground 0  17. Running on uneven ground 0  18. Making sharp turns while running fast 0  19. Hopping  0  20. Rolling over in bed 3  Score total:  40/80     COGNITION: Overall cognitive  status: Within functional limits for tasks assessed     SENSATION: Not tested Neuropathy in bilateral feet at night , why pt takes gabapentin     MUSCLE LENGTH: Hamstrings: Right side tightness; Left side tightness   POSTURE: rounded shoulders, forward head, and flexed trunk   PALPATION: NA  UPPER EXTREMITY ROM: Shoulder strength: 4/5 shoulder flexion and extension, abduction 4-/5, IR/ER 3+/5 tested in neutral Shoulder mobility: limited 60% in bilateral shoul flexion/Abd/ER/IR with pain and weakness reported   LOWER EXTREMITY ROM:  Active ROM Right eval Left eval  Hip flexion WNL WNL  Hip extension Dec ROM Dec Rom  Hip abduction Dec ROM Dec ROM  Hip adduction    Hip internal rotation Dec ROM Dec ROM  Hip external rotation Dec ROM Dec ROM  Knee flexion Dec ROM Dec ROM  Knee extension Dec ROM Dec ROM  Ankle dorsiflexion    Ankle plantarflexion    Ankle inversion    Ankle eversion     (Blank rows = not tested)  LOWER EXTREMITY MMT:  MMT Right eval Left eval  Hip flexion 4- 3+  Hip extension 4- 3+  Hip abduction 4- 3+  Hip adduction    Hip internal rotation 4- 4-  Hip external rotation 4- 4-  Knee flexion 4 3+  Knee extension 4- 3+  Ankle dorsiflexion    Ankle plantarflexion    Ankle inversion    Ankle eversion     (Blank rows = not tested)  LOWER EXTREMITY SPECIAL TESTS:    FUNCTIONAL TESTS:  8/19/255 times sit to stand: 15.27 sec used UE support  Timed up and go (TUG): 19.27 no cane; 19.00 sec with cane  3 minute walk test: not complete  12/17/23:  5x sit to stand: 19.73 seconds  TUG: 19.84 seconds   12/21/23:  3 min walk: 310 feet with 3/10 RPE  GAIT: Distance walked: 300 ft  Assistive device utilized: Single point cane Level of assistance: Min A, genu varus and antalgic gait. Comments: Pt walked from wait room across ortho gym to private room and back  TREATMENT DATE:  01/04/24: Nustep - 6 mins L5 - PT present to discuss progress  Step ups 4 inch at stairs 2x6 B Sit to stand x 10 with yellow loop around knees for encouraging hip abduction and proper alignment Seated LAQ 4# 2x10 Standing march 4# 2 x 5 B Seated march 4# 2x5 B Standing heel raises 4#  x 20 Seated ER with 4# seated 1 x 10 each  Seated clam with yellow loop x 20 Seated resisted hamstring curls with green band (PT anchoring) 2 x 10 Supine bridge 2 x 10  Supine SLR 2 x 10 (vc's for correct technique and to do short range) Supine hip abduction with towel under heel x 20 each LE - cues to avoid hip ER Side lying clam x 20 each side Seated bicep curls with 2 lbs x 20 each Seated punches with 1# 2x 10 Seated upper cuts 1# x 10 Seated Er 1# x 10 Seated tricep press with green tband 2 x 10 each side   12/31/23: Nustep - 6 mins L5 - PT present to discuss progress  Seated LAQ 4# 2x10 Seated march x 20 Seated ER with 4# seated 2 x 10 each  Seated clam with yellow loop x 20 Sit to stand 2 x 5 with yellow loop around knees for encouraging hip abduction and proper alignment Seated resisted hamstring curls with red band (PT anchoring) 2 x 10 Supine bridge 2 x 10 (vc's to place weight at the ball of the foot to avoid hamstring cramps) Supine SLR 2 x 10 (vc's for correct technique and to do short range) Supine hip abduction with towel under heel x 20 each LE Supine heel slides with towel under heel x 20 each LE Side lying clam x 20 each side Seated bicep curls with 2 lbs x 20 each Seated punches with 1# x 20 Seated tricep press with green tband 2 x 10 each side  12/28/23: Nustep - 6 mins L5 - PT present to discuss progress  Seated LAQ 4# 2x10 Seated march x 20 Seated ER with 4# seated 2 x 10 each  Sit to stand x 5 with heavy vc's for weight shift fwd Seated resisted hamstring curls with red band (PT anchoring) 2 x 10 Standing speed squats 2  x 10 Standing squat hold x 5 holding 10 sec Educated on appropriateness of using rollator walker due to her end stage knee condition to avoid falls and allow her more ease of movement.  Explained that the right glut medius weakness is also contributing to her balance issues.  A bad fall would set her way back.  Ok to use cane in home but if she is going out for a while, she would definitely be safer with the walker and would likely be more willing to go out and about because she is less fearful.    12/24/23: Nustep - 6 mins L5 - PT present to discuss progress  Seated LAQ 4# 2x10 ER with 4# seated x10 each  Standing heel raises  x 20  Weight shift on balance pad 3 ways x 1 min each  Standing marching 4#  x20 with bil UE support  Seated hip adduction with ball: 5 hold x20 Standing hip ABD and extension 2x10 Bil on balance pad Seated HS stretch 2x20 Step tap on 6 step with 4# ankle weights x20 Hurdles: forward and lateral with step to pattern.  Use of Ues for safety, 4 laps each    PATIENT  EDUCATION:  Education details: [email or web address] Person educated: Patient Education method: Explanation, Demonstration, Actor cues, Verbal cues, and Handouts Education comprehension: verbalized understanding, returned demonstration, and verbal cues required  HOME EXERCISE PROGRAM: Access Code: [email or web address] URL: [email or web address] Date: 12/07/2023 Prepared by: Burnard  Exercises - Seated Hip Abduction with Resistance  - 2-3 x daily - 7 x weekly - 1-2 sets - 10 reps - Seated Quad Set  - 2-3 x daily - 7 x weekly - 1-2 sets - 5 reps - 5 hold - Seated Heel Toe Raises  - 3 x daily - 7 x weekly - 2 sets - 10 reps - Sit to Stand with Armchair  - 2 x daily - 7 x weekly - 2 sets - 5 reps - Seated Long Arc Quad  - 2 x daily - 7 x weekly - 1 sets - 10 reps - Standing March with Counter Support  - 2 x daily - 7 x weekly - 10 reps - Seated Hip Adduction Isometrics with Ball  - 2 x daily - 7 x weekly - 1  sets - 10 reps - 5 hold   ASSESSMENT:  CLINICAL IMPRESSION: Patient wanted to continue with step ups so we did this first. She reports some left knee pain initially, but states it improves some as she does more reps. She also reports some initial pain with shoulder punches, but this resolves as well. She reports improved ease with sit to stand with bending forward more. She continues to demonstrate potential for improvement and would benefit from continued skilled therapy to address impairments.    OBJECTIVE IMPAIRMENTS: Abnormal gait, cardiopulmonary status limiting activity, decreased activity tolerance, decreased balance, decreased coordination, decreased endurance, decreased knowledge of condition, decreased mobility, difficulty walking, decreased ROM, decreased strength, impaired sensation, impaired UE functional use, improper body mechanics, postural dysfunction, and pain.   ACTIVITY LIMITATIONS: carrying, lifting, bending, standing, squatting, stairs, transfers, toileting, reach over head, and caring for others  PARTICIPATION LIMITATIONS: meal prep, cleaning, laundry, and yard work  PERSONAL FACTORS: Age, Fitness, Social background, Time since onset of injury/illness/exacerbation, and 3+ comorbidities:    REHAB POTENTIAL: Good  CLINICAL DECISION MAKING: Evolving/moderate complexity  EVALUATION COMPLEXITY: Moderate   GOALS: Goals reviewed with patient? Yes  SHORT TERM GOALS: Target date: 12/25/23 Pt will be independent with initial HEP so she can maintain PT gains and increase functional capacity.  Baseline: Goal status: in progress   2.  Pt will complete  3 min walk test to document her baseline.  Baseline: 310 feet (12/21/23) Goal status: MET     LONG TERM GOALS: Target date: 01/22/24  Pt will be independent with advanced HEP so she can maintain PT gains and increase functional capacity.  Baseline:  Goal status: INITIAL  2.  Pt will perform 5 Sit to stands in < or =  to 11 seconds or less without UE support so she is able to perform transfers independently.  Baseline: 19 seconds (12/17/23) Goal status: INITIAL  3.  Pt will score 4+/5 in all LE motions so she increases hip stability for improved functional capacity for navigating stairs.  Baseline:  Goal status: INITIAL  4.  Pt will complete Single limb stance for > or = to 10+ seconds bilaterally so she demonstrates improved balance for car transfers, and long standing.  Baseline:  Goal status: INITIAL  5.  Pt will complete TUG in less than or equal to 14 sec so she improves balance and coordination so she can go down stairs  safely.  Baseline: 19 sec  Goal status: INITIAL    PLAN:  PT FREQUENCY: 2x/week  PT DURATION: 8 weeks  PLANNED INTERVENTIONS: [id number]- PT Re-evaluation, 97750- Physical Performance Testing, 97110-Therapeutic exercises, 97530- Therapeutic activity, [id number]- Neuromuscular re-education, 97535- Self Care, [id number]- Manual therapy, [id number]- Gait training, [phone]- Orthotic/Prosthetic subsequent, [phone]- Canalith repositioning, [id number]- Aquatic Therapy, [phone]- Electrical stimulation (manual), [id number]- Ultrasound, [id number]- Traction (mechanical), [id number]- Ionotophoresis 4mg /ml Dexamethasone , 02981- Parrafin, Patient/Family education, Balance training, Stair training, Taping, Joint mobilization, Spinal mobilization, Manual lymph drainage, Scar mobilization, Compression bandaging, Vestibular training, DME instructions, Wheelchair mobility training, Cryotherapy, and Moist heat  PLAN FOR NEXT SESSION: Incorporate more UE strengthening,  No arms on Nustep due to lymphedema,  LE strengthening, flexibility, assess/update HEP to add standing tasks, balance tasks,  standing with weights or on balance pad.   Mliss Cummins, PT  01/05/24 1:33 PM [facility] [address] [place], [place] [address] Phone # [phone] Fax [phone]

## 2024-01-05 ENCOUNTER — Ambulatory Visit: Admitting: Physical Therapy

## 2024-01-05 ENCOUNTER — Encounter: Payer: Self-pay | Admitting: Physical Therapy

## 2024-01-05 DIAGNOSIS — M25562 Pain in left knee: Secondary | ICD-10-CM | POA: Diagnosis not present

## 2024-01-05 DIAGNOSIS — M6281 Muscle weakness (generalized): Secondary | ICD-10-CM

## 2024-01-05 DIAGNOSIS — R262 Difficulty in walking, not elsewhere classified: Secondary | ICD-10-CM

## 2024-01-05 DIAGNOSIS — R252 Cramp and spasm: Secondary | ICD-10-CM

## 2024-01-06 ENCOUNTER — Inpatient Hospital Stay: Attending: Adult Health

## 2024-01-06 ENCOUNTER — Inpatient Hospital Stay (HOSPITAL_BASED_OUTPATIENT_CLINIC_OR_DEPARTMENT_OTHER): Admitting: Hematology and Oncology

## 2024-01-06 ENCOUNTER — Inpatient Hospital Stay

## 2024-01-06 VITALS — BP 173/73 | HR 87 | Resp 18 | Ht 67.0 in | Wt 169.0 lb

## 2024-01-06 DIAGNOSIS — R432 Parageusia: Secondary | ICD-10-CM | POA: Insufficient documentation

## 2024-01-06 DIAGNOSIS — C50312 Malignant neoplasm of lower-inner quadrant of left female breast: Secondary | ICD-10-CM | POA: Diagnosis present

## 2024-01-06 DIAGNOSIS — R430 Anosmia: Secondary | ICD-10-CM | POA: Diagnosis not present

## 2024-01-06 DIAGNOSIS — Z8049 Family history of malignant neoplasm of other genital organs: Secondary | ICD-10-CM | POA: Diagnosis not present

## 2024-01-06 DIAGNOSIS — Z87891 Personal history of nicotine dependence: Secondary | ICD-10-CM | POA: Diagnosis not present

## 2024-01-06 DIAGNOSIS — Z79810 Long term (current) use of selective estrogen receptor modulators (SERMs): Secondary | ICD-10-CM | POA: Insufficient documentation

## 2024-01-06 DIAGNOSIS — I890 Lymphedema, not elsewhere classified: Secondary | ICD-10-CM | POA: Diagnosis not present

## 2024-01-06 DIAGNOSIS — C50919 Malignant neoplasm of unspecified site of unspecified female breast: Secondary | ICD-10-CM

## 2024-01-06 DIAGNOSIS — Z79899 Other long term (current) drug therapy: Secondary | ICD-10-CM | POA: Diagnosis not present

## 2024-01-06 DIAGNOSIS — Z803 Family history of malignant neoplasm of breast: Secondary | ICD-10-CM | POA: Diagnosis not present

## 2024-01-06 DIAGNOSIS — Z170 Estrogen receptor positive status [ER+]: Secondary | ICD-10-CM | POA: Diagnosis not present

## 2024-01-06 DIAGNOSIS — Z9013 Acquired absence of bilateral breasts and nipples: Secondary | ICD-10-CM | POA: Insufficient documentation

## 2024-01-06 DIAGNOSIS — I10 Essential (primary) hypertension: Secondary | ICD-10-CM | POA: Diagnosis not present

## 2024-01-06 LAB — CMP (CANCER CENTER ONLY)
ALT: 9 U/L (ref 0–44)
AST: 15 U/L (ref 15–41)
Albumin: 3.8 g/dL (ref 3.5–5.0)
Alkaline Phosphatase: 69 U/L (ref 38–126)
Anion gap: 6 (ref 5–15)
BUN: 16 mg/dL (ref 8–23)
CO2: 29 mmol/L (ref 22–32)
Calcium: 8.3 mg/dL — ABNORMAL LOW (ref 8.9–10.3)
Chloride: 107 mmol/L (ref 98–111)
Creatinine: 0.62 mg/dL (ref 0.44–1.00)
GFR, Estimated: >60 mL/min (ref >60)
Glucose, Bld: 109 mg/dL — ABNORMAL HIGH (ref 70–99)
Potassium: 4 mmol/L (ref 3.5–5.1)
Sodium: 142 mmol/L (ref 135–145)
Total Bilirubin: 0.5 mg/dL (ref 0.0–1.2)
Total Protein: 6.6 g/dL (ref 6.5–8.1)

## 2024-01-06 LAB — CBC WITH DIFFERENTIAL (CANCER CENTER ONLY)
Abs Immature Granulocytes: 0.02 K/uL (ref 0.00–0.07)
Basophils Absolute: 0.1 K/uL (ref 0.0–0.1)
Basophils Relative: 1 %
Eosinophils Absolute: 0.2 K/uL (ref 0.0–0.5)
Eosinophils Relative: 3 %
HCT: 34.6 % — ABNORMAL LOW (ref 36.0–46.0)
Hemoglobin: 11.1 g/dL — ABNORMAL LOW (ref 12.0–15.0)
Immature Granulocytes: 0 %
Lymphocytes Relative: 26 %
Lymphs Abs: 1.6 K/uL (ref 0.7–4.0)
MCH: 25.9 pg — ABNORMAL LOW (ref 26.0–34.0)
MCHC: 32.1 g/dL (ref 30.0–36.0)
MCV: 80.7 fL (ref 80.0–100.0)
Monocytes Absolute: 0.5 K/uL (ref 0.1–1.0)
Monocytes Relative: 7 %
Neutro Abs: 3.9 K/uL (ref 1.7–7.7)
Neutrophils Relative %: 63 %
Platelet Count: 186 K/uL (ref 150–400)
RBC: 4.29 MIL/uL (ref 3.87–5.11)
RDW: 15.5 % (ref 11.5–15.5)
WBC Count: 6.3 K/uL (ref 4.0–10.5)
nRBC: 0 % (ref 0.0–0.2)

## 2024-01-06 LAB — HEMOGLOBIN A1C
Hgb A1c MFr Bld: 5.8 % — ABNORMAL HIGH (ref 4.8–5.6)
Mean Plasma Glucose: 119.76 mg/dL

## 2024-01-06 LAB — MAGNESIUM: Magnesium: 1.9 mg/dL (ref 1.7–2.4)

## 2024-01-06 NOTE — Progress Notes (Signed)
 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) Staging form: Breast, AJCC 7th Edition - Clinical: Stage Unknown (TX, N1, M0) - Signed by Layla Sandria BROCKS, MD on 07/27/2014  Malignant neoplasm of lower-inner quadrant of left breast in female, estrogen receptor positive (HCC) Staging form: Breast, AJCC 7th Edition - Clinical: Stage IA (T1b, N0, M0) - Signed by Layla Sandria BROCKS, MD on 07/27/2014   SUMMARY OF ONCOLOGIC HISTORY: 1993: Right lumpectomy axillary dissection in [place]  (stage I) treated with radiation and tamoxifen  x 5 years 2002: Right mastectomy: Right breast recurrence 2004: Left mastectomy: 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  CURRENT THERAPY: Neratinib /tamoxifen   INTERVAL HISTORY:  Tami Schmidt 89 y.o. female returns for follow-up while on neratinib  and tamoxifen .  Discussed the use of AI scribe software for clinical note transcription with the patient, who gave verbal consent to proceed.  History of Present Illness  Tami Schmidt is an 89 year old female with cancer who presents for follow-up regarding her cancer treatment and medication management.  She is taking her medications as prescribed, including three tablets of neratinib  and one tablet of tamoxifen  daily. She is concerned about potential side effects of increasing the neratinib  dose, particularly diarrhea, and is currently on a  dose that is manageable for her.  Swelling has remained the same in her left arm since her last visit. She describes it as a nuisance but notes that it has not worsened. She recalls having similar swelling in the past, which resolved more quickly than her current experience.  She reports a recent weight gain, now weighing 175 pounds, up from 170 pounds. She recalls previously weighing 220 pounds and feels relieved at the weight gain, as she was concerned about weight loss.  She inquires about the status of her cancer, noting that the last check in June showed no growth. She wants the cancer to be gone but acknowledges that it has not worsened.  She experiences anosmia and ageusia, with no improvement in her ability to taste or smell. She can only taste salt and sweet flavors, and her nose runs frequently. She mentions that she has not been contacted by an ENT specialist despite previous discussions about her symptoms.  She feels a lack of energy but notes that her current oral medication regimen is preferable to previous infusion treatments.  No side effects from her current medications other than a lack of energy.     Patient Active Problem List   Diagnosis Date Noted   Acute lower GI bleeding 01/07/2023   Rectal bleeding 01/05/2023   Acute GI bleeding 10/04/2022   Port-A-Cath in place 06/12/2022   Loss of perception for taste 03/17/2022   Primary osteoarthritis involving multiple joints 03/17/2022   Genetic testing 07/24/2021   Microcytosis 04/29/2021   Hypokalemia 04/28/2021   Pulmonary embolism (HCC)    Agnosia 08/09/2020   Hardening of the aorta (main artery of the heart) 08/09/2020   Morbid obesity (HCC) 08/09/2020  Neuropathy 08/09/2020   Primary osteoarthritis 08/09/2020   Sensorineural hearing loss (SNHL) of both ears 08/07/2020   Anosmia 06/05/2020   Tinnitus, bilateral 06/05/2020   GI bleed 07/13/2019   ABLA (acute blood loss anemia) 07/12/2019   Microcytic anemia  01/05/2018   Melena 01/04/2018   Lower GI bleed 01/04/2018   Diabetes mellitus type II, controlled, with no complications (HCC) 12/05/2017   Tenosynovitis of left wrist 12/02/2017   Infection of left wrist (HCC) 11/30/2017   Primary osteoarthritis of right hip 01/05/2017   Osteoarthritis of right hip 01/03/2017   Lymphedema of upper extremity 01/17/2014   Osteopenia 01/17/2014   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   Dilated pore of [place] 08/02/2013   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   Diabetes mellitus    fasting 90-100   HTN (hypertension) 03/25/2013   Hx of radiation therapy    breasts hx   Hypercholesterolemia    Hypertension    Lymphedema of arm    left arm   Pulmonary embolism (HCC)    Type II or unspecified type diabetes mellitus without mention of complication, not stated as uncontrolled 03/25/2013    SURGICAL HISTORY: Past Surgical History:  Procedure Laterality Date   ABDOMINAL HYSTERECTOMY     ANKLE SURGERY Right 1995   APPENDECTOMY     BIOPSY  01/07/2018   Procedure: BIOPSY;  Surgeon: Saintclair Jasper, MD;  Location: [facility];  Service: Gastroenterology;;   BREAST SURGERY Bilateral    mastectomy   COLONOSCOPY WITH PROPOFOL  N/A 01/07/2018   Procedure:  COLONOSCOPY WITH PROPOFOL ;  Surgeon: Saintclair Jasper, MD;  Location: [facility];  Service: Gastroenterology;  Laterality: N/A;   ESOPHAGOGASTRODUODENOSCOPY (EGD) WITH PROPOFOL  N/A 01/06/2018   Procedure: ESOPHAGOGASTRODUODENOSCOPY (EGD) WITH PROPOFOL ;  Surgeon: Saintclair Jasper, MD;  Location: [facility];  Service: Gastroenterology;  Laterality: N/A;   HERNIA REPAIR  04-26-2010   IR IMAGING GUIDED PORT INSERTION  10/29/2021   MASS EXCISION Left 01/26/2013   Procedure: EXCISION LEFT CHEST WALL MASS AND LEFT ABDOMNAL WALL MASS;  Surgeon: Vicenta DELENA Poli, MD;  Location: [facility] OR;  Service: General;  Laterality: Left;   MASS EXCISION Left 09/20/2014   Procedure: EXCISION OF LEFT CHEST WALL MASS;  Surgeon: Vicenta Poli, MD;  Location: [facility];  Service: General;  Laterality: Left;   TEE WITHOUT CARDIOVERSION N/A 12/08/2017   Procedure: TRANSESOPHAGEAL ECHOCARDIOGRAM (TEE);  Surgeon: Pietro Redell RAMAN, MD;  Location: [facility] END[facility]COPY;  Service: Cardiovascular;  Laterality: N/A;   TOTAL HIP ARTHROPLASTY Right 01/05/2017   TOTAL HIP ARTHROPLASTY Right 01/05/2017   Procedure: TOTAL HIP ARTHROPLASTY ANTERIOR APPROACH;  Surgeon: Liam Lerner, MD;  Location: [facility] OR;  Service: Orthopedics;  Laterality: Right;    SOCIAL HISTORY: Social History   Socioeconomic History   Marital status: Married    Spouse name:  Not on file   Number of children: Not on file   Years of education: Not on file   Highest education level: Not on file  Occupational History   Occupation: retired Child psychotherapist  Tobacco Use   Smoking status: Former    Current packs/day: 0.00    Types: Cigarettes    Quit date: 04/14/1972    Years since quitting: 51.7   Smokeless tobacco: Never  Vaping Use   Vaping status: Never Used  Substance and Sexual Activity   Alcohol use: Not Currently    Comment: occasional   Drug use: Not Currently   Sexual activity: Yes    Birth control/protection: Surgical  Other Topics Concern   Not on  file  Social History Narrative   Not on file   Social Drivers of Health   Financial Resource Strain: Not on file  Food Insecurity: No Food Insecurity (05/21/2023)   Hunger Vital Sign    Worried About Running Out of Food in the Last Year: Never true    Ran Out of Food in the Last Year: Never true  Transportation Needs: No Transportation Needs (05/21/2023)   PRAPARE - Administrator, Civil Service (Medical): No    Lack of Transportation (Non-Medical): No  Physical Activity: Not on file  Stress: Not on file  Social Connections: Socially Integrated (05/21/2023)   Social Connection and Isolation Panel    Frequency of Communication with Friends and Family: More than three times a week    Frequency of Social Gatherings with Friends and Family: More than three times a week    Attends Religious Services: More than 4 times per year    Active Member of [employer] or Organizations: Yes    Attends Engineer, structural: More than 4 times per year    Marital Status: Married  Catering manager Violence: Not At Risk (05/21/2023)   Humiliation, Afraid, Rape, and Kick questionnaire    Fear of Current or Ex-Partner: No    Emotionally Abused: No    Physically Abused: No    Sexually Abused: No    FAMILY HISTORY: Family History  Problem Relation Age of Onset   Breast cancer Cousin        maternal first cousin   Uterine cancer Cousin        maternal first cousin     PHYSICAL EXAMINATION    BP (!) 173/73 (BP Location: Right Arm, Patient Position: Sitting)   Pulse 87   Resp 18   Ht 5' 7 (1.702 m)   Wt 169 lb (76.7 kg)   SpO2 99%   BMI 26.47 kg/m     Physical Exam Constitutional:      General: She is not in acute distress.    Appearance: Normal appearance. She is not toxic-appearing.  HENT:     Head: Normocephalic and atraumatic.     Mouth/Throat:     Mouth: Mucous membranes are moist.     Pharynx: Oropharynx is clear. No oropharyngeal exudate or posterior oropharyngeal  erythema.  Eyes:     General: No scleral icterus. Cardiovascular:     Rate and Rhythm: Normal rate and regular rhythm.     Pulses: Normal pulses.     Heart sounds: Normal heart sounds.  Pulmonary:     Effort: Pulmonary effort is normal.     Breath sounds: Normal breath sounds.  Abdominal:     General: Abdomen is flat. Bowel sounds are normal. There is no distension.  Palpations: Abdomen is soft.     Tenderness: There is no abdominal tenderness.  Musculoskeletal:        General: Swelling (left arm lymphedema) present.     Cervical back: Neck supple.  Lymphadenopathy:     Cervical: No cervical adenopathy.  Skin:    General: Skin is warm and dry.     Findings: No rash.  Neurological:     General: No focal deficit present.     Mental Status: She is alert.  Psychiatric:        Mood and Affect: Mood normal.        Behavior: Behavior normal.     LABORATORY DATA:  CBC    Component Value Date/Time   WBC 6.3 01/06/2024 1326   WBC 5.3 10/12/2023 0904   RBC 4.29 01/06/2024 1326   HGB 11.1 (L) 01/06/2024 1326   HGB 10.5 (L) 01/20/2017 1057   HCT 34.6 (L) 01/06/2024 1326   HCT 25.8 (L) 01/05/2018 0513   HCT 32.8 (L) 01/20/2017 1057   PLT 186 01/06/2024 1326   PLT 368 01/20/2017 1057   MCV 80.7 01/06/2024 1326   MCV 77.8 (L) 01/20/2017 1057   MCH 25.9 (L) 01/06/2024 1326   MCHC 32.1 01/06/2024 1326   RDW 15.5 01/06/2024 1326   RDW 16.0 (H) 01/20/2017 1057   LYMPHSABS 1.6 01/06/2024 1326   LYMPHSABS 1.3 01/20/2017 1057   MONOABS 0.5 01/06/2024 1326   MONOABS 0.4 01/20/2017 1057   EOSABS 0.2 01/06/2024 1326   EOSABS 0.1 01/20/2017 1057   BASOSABS 0.1 01/06/2024 1326   BASOSABS 0.1 01/20/2017 1057    CMP     Component Value Date/Time   NA 142 01/06/2024 1326   NA 139 01/20/2017 1057   K 4.0 01/06/2024 1326   K 3.9 01/20/2017 1057   CL 107 01/06/2024 1326   CO2 29 01/06/2024 1326   CO2 25 01/20/2017 1057   GLUCOSE 109 (H) 01/06/2024 1326   GLUCOSE 135  01/20/2017 1057   BUN 16 01/06/2024 1326   BUN 15.6 01/20/2017 1057   CREATININE 0.62 01/06/2024 1326   CREATININE 0.7 01/20/2017 1057   CALCIUM 8.3 (L) 01/06/2024 1326   CALCIUM 9.3 01/20/2017 1057   PROT 6.6 01/06/2024 1326   PROT 7.1 01/20/2017 1057   ALBUMIN 3.8 01/06/2024 1326   ALBUMIN 3.4 (L) 01/20/2017 1057   AST 15 01/06/2024 1326   AST 10 01/20/2017 1057   ALT 9 01/06/2024 1326   ALT 14 01/20/2017 1057   ALKPHOS 69 01/06/2024 1326   ALKPHOS 69 01/20/2017 1057   BILITOT 0.5 01/06/2024 1326   BILITOT 0.43 01/20/2017 1057   GFRNONAA >60 01/06/2024 1326   GFRAA >60 07/14/2019 0538     ASSESSMENT and THERAPY PLAN:   Assessment and Plan Assessment & Plan Breast cancer, metastatic with non regional LN on tamoxifen  and neratinib  . Evaluating reduced neratinib  dose for effectiveness. Weight gain noted, low energy levels reported. - Continue neratinib  (three tablets) and tamoxifen  (one tablet). - Order follow-up scan to assess cancer status.  Lymphedema secondary to breast cancer treatment Lymphedema stable, attributed to previous surgery and treatment.  Anosmia and ageusia Persistent anosmia and ageusia, possibly related to previous infusion treatment. - Follow up with ENT specialist for evaluation. - Ensure ENT specialist contacts her for appointment.  Hypertension Elevated blood pressure noted, possibly due to missed antihypertensive dose. - Instruct to take blood pressure medication as prescribed. - Monitor blood pressure and adjust treatment if necessary.   All  questions were answered. The patient knows to call the clinic with any problems, questions or concerns. We can certainly see the patient much sooner if necessary.  Total encounter time:30 minutes*in face-to-face visit time, chart review, lab review, care coordination, order entry, and documentation of the encounter time.  *Total Encounter Time as defined by the Centers for Medicare and Medicaid Services  includes, in addition to the face-to-face time of a patient visit (documented in the note above) non-face-to-face time: obtaining and reviewing outside history, ordering and reviewing medications, tests or procedures, care coordination (communications with other health care professionals or caregivers) and documentation in the medical record.

## 2024-01-07 ENCOUNTER — Ambulatory Visit

## 2024-01-11 ENCOUNTER — Ambulatory Visit

## 2024-01-11 ENCOUNTER — Other Ambulatory Visit: Payer: Self-pay | Admitting: Pharmacist

## 2024-01-11 DIAGNOSIS — M6281 Muscle weakness (generalized): Secondary | ICD-10-CM

## 2024-01-11 DIAGNOSIS — M25562 Pain in left knee: Secondary | ICD-10-CM | POA: Diagnosis not present

## 2024-01-11 DIAGNOSIS — R252 Cramp and spasm: Secondary | ICD-10-CM

## 2024-01-11 DIAGNOSIS — M19011 Primary osteoarthritis, right shoulder: Secondary | ICD-10-CM

## 2024-01-11 DIAGNOSIS — R262 Difficulty in walking, not elsewhere classified: Secondary | ICD-10-CM

## 2024-01-11 MED ORDER — NERATINIB MALEATE 40 MG PO TABS: 120.0000 mg | ORAL_TABLET | Freq: Every day | ORAL | 0 refills | Status: DC

## 2024-01-11 NOTE — Therapy (Signed)
 OUTPATIENT PHYSICAL THERAPY LOWER EXTREMITY TREATMENT       Patient Name: Tami Schmidt MRN: [id number] DOB:08-18-1932, 89 y.o., female Today's Date: 01/11/2024  END OF SESSION:  PT End of Session - 01/11/24 1143     Visit Number 12    Date for Recertification  01/26/24    Authorization Type [employer]    Authorization Time Period 8/19 to 10/14- 16 visits    Authorization - Visit Number 12    Authorization - Number of Visits 16    Progress Note Due on Visit 20    PT Start Time 1056    PT Stop Time 1144    PT Time Calculation (min) 48 min    Activity Tolerance Patient tolerated treatment well    Behavior During Therapy WFL for tasks assessed/performed                  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   Diabetes mellitus    fasting 90-100   HTN (hypertension) 03/25/2013   Hx of radiation therapy    breasts hx   Hypercholesterolemia    Hypertension    Lymphedema of arm    left arm   Pulmonary embolism (HCC)    Type II or unspecified type diabetes mellitus without mention of complication, not stated as uncontrolled 03/25/2013   Past Surgical History:  Procedure Laterality Date   ABDOMINAL HYSTERECTOMY     ANKLE SURGERY Right 1995   APPENDECTOMY     BIOPSY  01/07/2018   Procedure: BIOPSY;  Surgeon: Saintclair Jasper, MD;  Location: [facility];  Service: Gastroenterology;;   BREAST SURGERY Bilateral    mastectomy   COLONOSCOPY WITH PROPOFOL  N/A 01/07/2018   Procedure: COLONOSCOPY WITH PROPOFOL ;  Surgeon: Saintclair Jasper, MD;  Location: [facility];  Service: Gastroenterology;  Laterality: N/A;   ESOPHAGOGASTRODUODENOSCOPY (EGD) WITH PROPOFOL  N/A 01/06/2018   Procedure: ESOPHAGOGASTRODUODENOSCOPY (EGD) WITH PROPOFOL ;  Surgeon: Saintclair Jasper, MD;  Location: [facility];  Service: Gastroenterology;  Laterality: N/A;   HERNIA REPAIR   04-26-2010   IR IMAGING GUIDED PORT INSERTION  10/29/2021   MASS EXCISION Left 01/26/2013   Procedure: EXCISION LEFT CHEST WALL MASS AND LEFT ABDOMNAL WALL MASS;  Surgeon: Vicenta DELENA Poli, MD;  Location: [facility];  Service: General;  Laterality: Left;   MASS EXCISION Left 09/20/2014   Procedure: EXCISION OF LEFT CHEST WALL MASS;  Surgeon: Vicenta Poli, MD;  Location: [facility];  Service: General;  Laterality: Left;   TEE WITHOUT CARDIOVERSION N/A 12/08/2017   Procedure: TRANSESOPHAGEAL ECHOCARDIOGRAM (TEE);  Surgeon: Pietro Redell RAMAN, MD;  Location: [facility];  Service: Cardiovascular;  Laterality: N/A;   TOTAL HIP ARTHROPLASTY Right 01/05/2017   TOTAL HIP ARTHROPLASTY Right 01/05/2017   Procedure: TOTAL HIP ARTHROPLASTY ANTERIOR APPROACH;  Surgeon: Liam Lerner, MD;  Location: [facility];  Service: Orthopedics;  Laterality: Right;   Patient Active Problem List   Diagnosis Date Noted   Acute lower GI bleeding 01/07/2023   Rectal bleeding 01/05/2023   Acute GI bleeding 10/04/2022   Port-A-Cath in place 06/12/2022   Loss of perception for taste 03/17/2022   Primary osteoarthritis involving multiple joints 03/17/2022   Genetic testing 07/24/2021   Microcytosis 04/29/2021   Hypokalemia 04/28/2021   Pulmonary embolism (HCC)    Agnosia 08/09/2020   Hardening  of the aorta (main artery of the heart) 08/09/2020   Morbid obesity (HCC) 08/09/2020   Neuropathy 08/09/2020   Primary osteoarthritis 08/09/2020   Sensorineural hearing loss (SNHL) of both ears 08/07/2020   Anosmia 06/05/2020   Tinnitus, bilateral 06/05/2020   GI bleed 07/13/2019   ABLA (acute blood loss anemia) 07/12/2019   Microcytic anemia 01/05/2018   Melena 01/04/2018   Lower GI bleed 01/04/2018   Diabetes mellitus type II, controlled, with no complications (HCC) 12/05/2017   Tenosynovitis of left wrist 12/02/2017   Infection of left wrist (HCC) 11/30/2017   Primary osteoarthritis of right hip 01/05/2017    Osteoarthritis of right hip 01/03/2017   Lymphedema of upper extremity 01/17/2014   Osteopenia 01/17/2014   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   Dilated pore of [place]iner 08/02/2013   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 08/02/2013   Scar 08/02/2013   Malignant neoplasm of lower-inner quadrant of left breast in female, estrogen receptor positive (HCC) 06/16/2013   Type II or unspecified type diabetes mellitus without mention of complication, not stated as uncontrolled 03/25/2013   Essential hypertension 03/25/2013   Chest wall recurrence of breast cancer (HCC) 02/21/2013   Abdominal wall mass 01/14/2013    PCP: Following with cancer center   REFERRING PROVIDER: Joane Birmingham, MD  REFERRING DIAG: Chronic pain of both shoulders, Chronic pain of left knee, Balance problem   THERAPY DIAG:  Difficulty in walking, not elsewhere classified  Muscle weakness (generalized)  Cramp and spasm  Left knee pain, unspecified chronicity  Primary osteoarthritis of shoulders, bilateral  Rationale for Evaluation and Treatment: Rehabilitation  ONSET DATE: 3-4 years ago  SUBJECTIVE:   SUBJECTIVE STATEMENT: I am not sure that I'm getting better.  I want to be able to walk up steps.     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 Pts husband has wrapped before and she has a night garment  PAIN:   01/11/24   0/10 pain in Lt knee Are you having pain? Yes: Pain location: bil shoulders Pain description: ache, didn't rate the pain Aggravating factors: lifting arms to side or forward  Relieving factors: not moving it  PRECAUTIONS: None  RED FLAGS: None   WEIGHT BEARING RESTRICTIONS: No  FALLS:  Has patient fallen in last 6 months? Yes. Number of falls 1- PT will address falls in PT treatment   LIVING ENVIRONMENT: Lives with: lives with their family and lives with their spouse Lives in: House/apartment Stairs: Yes: Internal: 16 steps; on right going up Has following equipment at home: Single point cane  OCCUPATION: Retired, husband is the Financial trader, read   PLOF: Independent  PATIENT GOALS: Walk up the steps with both feet, strengthen both knees  NEXT MD VISIT: Sees Oncologist in 1 month  OBJECTIVE:  Note: Objective measures were completed at Evaluation unless otherwise noted.  DIAGNOSTIC FINDINGS:  PATIENT SURVEYS:  LEFS  Extreme difficulty/unable (0), Quite a bit of difficulty (1), Moderate difficulty (2), Little difficulty (3), No difficulty (4) Survey date:  12/01/23 01/11/24  Any of your usual work, housework or school activities 3 4  2. Usual hobbies, recreational or sporting activities 3 4  3. Getting into/out of the bath 3 3  4. Walking between rooms 3 4  5. Putting on socks/shoes 3 4  6. Squatting  0 1  7. Lifting an object, like a bag of groceries from the floor 2 3  8. Performing light activities around your home 3 4  9. Performing heavy activities around your home 1 1  10. Getting into/out of a car 4 4  11. Walking 2 blocks 3 3  12. Walking 1 mile 0 0  13. Going up/down 10 stairs (1 flight) 3 1  14. Standing for 1 hour 2 2  15.  sitting for 1 hour 4 4  16. Running on even ground 0 0  17. Running on uneven ground 0 0  18. Making sharp turns  while running fast 0 0  19. Hopping  0 0  20. Rolling over in bed 3 4   Score total:  40/80 46/80     COGNITION: Overall cognitive status: Within functional limits for tasks assessed     SENSATION: Not tested Neuropathy in bilateral feet at night , why pt takes gabapentin     MUSCLE LENGTH: Hamstrings: Right side tightness; Left side tightness   POSTURE: rounded shoulders, forward head, and flexed trunk   PALPATION: NA  UPPER EXTREMITY ROM: Shoulder strength: 4/5 shoulder flexion and extension, abduction 4-/5, IR/ER 3+/5 tested in neutral Shoulder mobility: limited 60% in bilateral shoul flexion/Abd/ER/IR with pain and weakness reported   LOWER EXTREMITY ROM:  Active ROM Right eval Left eval  Hip flexion WNL WNL  Hip extension Dec ROM Dec Rom  Hip abduction Dec ROM Dec ROM  Hip adduction    Hip internal rotation Dec ROM Dec ROM  Hip external rotation Dec ROM Dec ROM  Knee flexion Dec ROM Dec ROM  Knee extension Dec ROM Dec ROM  Ankle dorsiflexion    Ankle plantarflexion    Ankle inversion    Ankle eversion     (Blank rows = not tested)  LOWER EXTREMITY MMT:  MMT Right eval Left eval  Hip flexion 4- 3+  Hip extension 4- 3+  Hip abduction 4- 3+  Hip adduction    Hip internal rotation 4- 4-  Hip external rotation 4- 4-  Knee flexion 4 3+  Knee extension 4- 3+  Ankle dorsiflexion    Ankle plantarflexion    Ankle inversion    Ankle eversion     (Blank rows = not tested)  LOWER EXTREMITY SPECIAL TESTS:    FUNCTIONAL TESTS:  8/19/255 times sit to stand: 15.27 sec used UE support  Timed up and go (TUG): 19.27 no cane; 19.00 sec with cane  3 minute walk test: not complete  12/17/23:  5x sit to stand: 19.73 seconds  TUG: 19.84 seconds   12/21/23:  3 min walk: 310 feet with 3/10 RPE  01/11/24:  5x sit to stand: 18.32 seconds  TUG: 16.83 seconds with cane    GAIT: Distance walked: 300 ft  Assistive device utilized: Single point cane Level of  assistance: Min A, genu varus and antalgic gait. Comments: Pt walked from wait room across ortho gym to private room and back  TREATMENT DATE:   01/11/24: Nustep - 6 mins L5 - PT present to discuss progress  Step ups 4 inch 1x7, 1x10 bil each  Sit to stand x 10 with yellow loop around knees for encouraging hip abduction and proper alignment Seated LAQ 4# 2x10 Seated march 4# 2x5 B Standing heel raises 4#  x 20 Seated ER with 4# seated 2 x 10 each  Seated clam with yellow loop x 20 Seated resisted hamstring curls with green band (PT anchoring) 2 x 10 Supine bridge 2 x 10  Supine SLR 2 x 10 (vc's for correct technique and to do short range)  Side lying clam x 20 each side Seated bicep curls with  lbs x 20 each Seated punches with 1# 2x 10 Seated upper cuts 1# x 10 Seated shoulder ER 1# x 10 Seated tricep press with green tband 2 x 10 each side   01/04/24: Nustep - 6 mins L5 - PT present to discuss progress  TUG and 5x sit to stand Step ups 4 inch at stairs 2x6 B Sit to stand x 10 with yellow loop around knees for encouraging hip abduction and proper alignment Seated LAQ 4# 2x10 Standing march 4# 2 x 5 B Seated march 4# 2x5 B Standing heel raises 4#  x 20 Seated ER with 4# seated 1 x 10 each  Seated clam with yellow loop x 20 Seated bicep curls with 2 lbs x 20 each Seated punches with 1# 2x 10 Seated upper cuts 1# x 10 Seated Er 1# x 10 Seated tricep press with green tband 2 x 10 each side   12/31/23: Nustep - 6 mins L5 - PT present to discuss progress  Seated LAQ 4# 2x10 Seated march x 20 Seated ER with 4# seated 2 x 10 each  Seated clam with yellow loop x 20 Sit to stand 2 x 5 with yellow loop around knees for encouraging hip abduction and proper alignment Seated resisted hamstring curls with red band (PT anchoring) 2 x 10 Supine bridge 2 x 10  (vc's to place weight at the ball of the foot to avoid hamstring cramps) Supine SLR 2 x 10 (vc's for correct technique and to do short range) Supine hip abduction with towel under heel x 20 each LE Supine heel slides with towel under heel x 20 each LE Side lying clam x 20 each side Seated bicep curls with 2 lbs x 20 each Seated punches with 1# x 20 Seated tricep press with green tband 2 x 10 each side    PATIENT EDUCATION:  Education details: [email or web address] Person educated: Patient Education method: Explanation, Demonstration, Tactile cues, Verbal cues, and Handouts Education comprehension: verbalized understanding, returned demonstration, and verbal cues required  HOME EXERCISE PROGRAM: Access Code: [email or web address] URL: [email or web address] Date: 12/07/2023 Prepared by: Burnard  Exercises - Seated Hip Abduction with Resistance  - 2-3 x daily - 7 x weekly - 1-2 sets - 10 reps - Seated Quad Set  - 2-3 x daily - 7 x weekly - 1-2 sets - 5 reps - 5 hold - Seated Heel Toe Raises  - 3 x daily - 7 x weekly - 2 sets - 10 reps - Sit to Stand with Armchair  - 2 x daily - 7 x weekly - 2 sets - 5 reps - Seated Long Arc Quad  - 2 x daily - 7 x weekly - 1 sets - 10 reps - Standing March with Counter Support  - 2 x daily -  7 x weekly - 10 reps - Seated Hip Adduction Isometrics with Ball  - 2 x daily - 7 x weekly - 1 sets - 10 reps - 5 hold   ASSESSMENT:  CLINICAL IMPRESSION: LEFS is improved to 46/80 from 40/80 at evaluation. 5x sit to stand time is improved and pt requires continues UE support. TUG time also improved.  Discussion with pt regarding purpose of PT and realistic expectations for progress.   She does report that her balance/stability are improved as she is able to walk without her cane at home.  Her main complaint at this time is difficulty with negotiating steps.  PT monitored throughout session for safety, cueing and to monitor for fatigue. She continues to demonstrate potential  for improvement and would benefit from continued skilled therapy to address impairments.    OBJECTIVE IMPAIRMENTS: Abnormal gait, cardiopulmonary status limiting activity, decreased activity tolerance, decreased balance, decreased coordination, decreased endurance, decreased knowledge of condition, decreased mobility, difficulty walking, decreased ROM, decreased strength, impaired sensation, impaired UE functional use, improper body mechanics, postural dysfunction, and pain.   ACTIVITY LIMITATIONS: carrying, lifting, bending, standing, squatting, stairs, transfers, toileting, reach over head, and caring for others  PARTICIPATION LIMITATIONS: meal prep, cleaning, laundry, and yard work  PERSONAL FACTORS: Age, Fitness, Social background, Time since onset of injury/illness/exacerbation, and 3+ comorbidities:    REHAB POTENTIAL: Good  CLINICAL DECISION MAKING: Evolving/moderate complexity  EVALUATION COMPLEXITY: Moderate   GOALS: Goals reviewed with patient? Yes  SHORT TERM GOALS: Target date: 12/25/23 Pt will be independent with initial HEP so she can maintain PT gains and increase functional capacity.  Baseline: 01/11/24 Goal status: MET  2.  Pt will complete  3 min walk test to document her baseline.  Baseline: 310 feet (12/21/23) Goal status: MET     LONG TERM GOALS: Target date: 01/22/24  Pt will be independent with advanced HEP so she can maintain PT gains and increase functional capacity.  Baseline:  Goal status: INITIAL  2.  Pt will perform 5 Sit to stands in < or = to 11 seconds or less without UE support so she is able to perform transfers independently.  Baseline: 18.32 (01/11/24) Goal status: In progress   3.  Pt will score 4+/5 in all LE motions so she increases hip stability for improved functional capacity for navigating stairs.  Baseline:  Goal status: INITIAL  4.  Pt will complete Single limb stance for > or = to 10+ seconds bilaterally so she demonstrates  improved balance for car transfers, and long standing.  Baseline:  Goal status: INITIAL  5.  Pt will complete TUG in less than or equal to 14 sec so she improves balance and coordination so she can go down stairs safely.  Baseline:16.83 seconds  with cane (01/11/24) Goal status: In progress     PLAN:  PT FREQUENCY: 2x/week  PT DURATION: 8 weeks  PLANNED INTERVENTIONS: [id number]- PT Re-evaluation, 97750- Physical Performance Testing, 97110-Therapeutic exercises, 97530- Therapeutic activity, [id number]- Neuromuscular re-education, 97535- Self Care, [id number]- Manual therapy, [id number]- Gait training, [phone]- Orthotic/Prosthetic subsequent, [id number]- Canalith repositioning, [id number]- Aquatic Therapy, [phone]- Electrical stimulation (manual), [id number]- Ultrasound, [id number]- Traction (mechanical), [id number]- Ionotophoresis 4mg /ml Dexamethasone , 02981- Parrafin, Patient/Family education, Balance training, Stair training, Taping, Joint mobilization, Spinal mobilization, Manual lymph drainage, Scar mobilization, Compression bandaging, Vestibular training, DME instructions, Wheelchair mobility training, Cryotherapy, and Moist heat  PLAN FOR NEXT SESSION: Incorporate more UE strengthening,  LE strengthening, flexibility, assess/update HEP to add standing tasks, balance tasks,  standing with  weights or on balance pad. 3 min walk test   Burnard Joy, PT 01/11/24 11:46 AM  [facility] [address] [place], [place] [address] Phone # [phone] Fax [phone]

## 2024-01-13 ENCOUNTER — Ambulatory Visit: Attending: Family Medicine

## 2024-01-13 DIAGNOSIS — R252 Cramp and spasm: Secondary | ICD-10-CM | POA: Insufficient documentation

## 2024-01-13 DIAGNOSIS — M19011 Primary osteoarthritis, right shoulder: Secondary | ICD-10-CM | POA: Insufficient documentation

## 2024-01-13 DIAGNOSIS — R293 Abnormal posture: Secondary | ICD-10-CM | POA: Insufficient documentation

## 2024-01-13 DIAGNOSIS — M25562 Pain in left knee: Secondary | ICD-10-CM | POA: Insufficient documentation

## 2024-01-13 DIAGNOSIS — M19012 Primary osteoarthritis, left shoulder: Secondary | ICD-10-CM | POA: Insufficient documentation

## 2024-01-13 DIAGNOSIS — R262 Difficulty in walking, not elsewhere classified: Secondary | ICD-10-CM | POA: Insufficient documentation

## 2024-01-13 DIAGNOSIS — M6281 Muscle weakness (generalized): Secondary | ICD-10-CM | POA: Insufficient documentation

## 2024-01-13 NOTE — Therapy (Signed)
 OUTPATIENT PHYSICAL THERAPY LOWER EXTREMITY TREATMENT       Patient Name: Tami Schmidt MRN: [id number] DOB:March 06, 1933, 89 y.o., female Today's Date: 01/13/2024  END OF SESSION:  PT End of Session - 01/13/24 1111     Visit Number 13    Number of Visits 16    Date for Recertification  01/26/24    Authorization Type [employer]    Authorization Time Period 8/19 to 10/14- 16 visits    Authorization - Visit Number 13    Authorization - Number of Visits 16    PT Start Time 1105    PT Stop Time 1145    PT Time Calculation (min) 40 min    Activity Tolerance Patient tolerated treatment well    Behavior During Therapy WFL for tasks assessed/performed                  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   Diabetes mellitus    fasting 90-100   HTN (hypertension) 03/25/2013   Hx of radiation therapy    breasts hx   Hypercholesterolemia    Hypertension    Lymphedema of arm    left arm   Pulmonary embolism (HCC)    Type II or unspecified type diabetes mellitus without mention of complication, not stated as uncontrolled 03/25/2013   Past Surgical History:  Procedure Laterality Date   ABDOMINAL HYSTERECTOMY     ANKLE SURGERY Right 1995   APPENDECTOMY     BIOPSY  01/07/2018   Procedure: BIOPSY;  Surgeon: Saintclair Jasper, MD;  Location: [facility];  Service: Gastroenterology;;   BREAST SURGERY Bilateral    mastectomy   COLONOSCOPY WITH PROPOFOL  N/A 01/07/2018   Procedure: COLONOSCOPY WITH PROPOFOL ;  Surgeon: Saintclair Jasper, MD;  Location: [facility];  Service: Gastroenterology;  Laterality: N/A;   ESOPHAGOGASTRODUODENOSCOPY (EGD) WITH PROPOFOL  N/A 01/06/2018   Procedure: ESOPHAGOGASTRODUODENOSCOPY (EGD) WITH PROPOFOL ;  Surgeon: Saintclair Jasper, MD;  Location: [facility];  Service: Gastroenterology;  Laterality: N/A;   HERNIA REPAIR   04-26-2010   IR IMAGING GUIDED PORT INSERTION  10/29/2021   MASS EXCISION Left 01/26/2013   Procedure: EXCISION LEFT CHEST WALL MASS AND LEFT ABDOMNAL WALL MASS;  Surgeon: Vicenta DELENA Poli, MD;  Location: [facility];  Service: General;  Laterality: Left;   MASS EXCISION Left 09/20/2014   Procedure: EXCISION OF LEFT CHEST WALL MASS;  Surgeon: Vicenta Poli, MD;  Location: [facility];  Service: General;  Laterality: Left;   TEE WITHOUT CARDIOVERSION N/A 12/08/2017   Procedure: TRANSESOPHAGEAL ECHOCARDIOGRAM (TEE);  Surgeon: Pietro Redell RAMAN, MD;  Location: [facility];  Service: Cardiovascular;  Laterality: N/A;   TOTAL HIP ARTHROPLASTY Right 01/05/2017   TOTAL HIP ARTHROPLASTY Right 01/05/2017   Procedure: TOTAL HIP ARTHROPLASTY ANTERIOR APPROACH;  Surgeon: Liam Lerner, MD;  Location: [facility];  Service: Orthopedics;  Laterality: Right;   Patient Active Problem List   Diagnosis Date Noted   Acute lower GI bleeding 01/07/2023   Rectal bleeding 01/05/2023   Acute GI bleeding 10/04/2022   Port-A-Cath in place 06/12/2022   Loss of perception for taste 03/17/2022   Primary osteoarthritis involving multiple joints 03/17/2022   Genetic testing 07/24/2021   Microcytosis 04/29/2021   Hypokalemia 04/28/2021   Pulmonary embolism (HCC)    Agnosia 08/09/2020   Hardening of the  aorta (main artery of the heart) 08/09/2020   Morbid obesity (HCC) 08/09/2020   Neuropathy 08/09/2020   Primary osteoarthritis 08/09/2020   Sensorineural hearing loss (SNHL) of both ears 08/07/2020   Anosmia 06/05/2020   Tinnitus, bilateral 06/05/2020   GI bleed 07/13/2019   ABLA (acute blood loss anemia) 07/12/2019   Microcytic anemia 01/05/2018   Melena 01/04/2018   Lower GI bleed 01/04/2018   Diabetes mellitus type II, controlled, with no complications (HCC) 12/05/2017   Tenosynovitis of left wrist 12/02/2017   Infection of left wrist (HCC) 11/30/2017   Primary osteoarthritis of right hip 01/05/2017    Osteoarthritis of right hip 01/03/2017   Lymphedema of upper extremity 01/17/2014   Osteopenia 01/17/2014   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   Dilated pore of Winer 08/02/2013   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 08/02/2013   Scar 08/02/2013   Malignant neoplasm of lower-inner quadrant of left breast in female, estrogen receptor positive (HCC) 06/16/2013   Type II or unspecified type diabetes mellitus without mention of complication, not stated as uncontrolled 03/25/2013   Essential hypertension 03/25/2013   Chest wall recurrence of breast cancer (HCC) 02/21/2013   Abdominal wall mass 01/14/2013    PCP: Following with cancer center   REFERRING PROVIDER: Joane Birmingham, MD  REFERRING DIAG: Chronic pain of both shoulders, Chronic pain of left knee, Balance problem   THERAPY DIAG:  Left knee pain, unspecified chronicity  Primary osteoarthritis of shoulders, bilateral  Difficulty in walking, not elsewhere classified  Muscle weakness (generalized)  Abnormal posture  Rationale for Evaluation and Treatment: Rehabilitation  ONSET DATE: 3-4 years ago  SUBJECTIVE:   SUBJECTIVE STATEMENT: Patient reports she still has to do step to on going up and down steps.  I wish I could do steps normally.       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 Pts husband has wrapped before and she has a night garment  PAIN:   01/11/24   0/10 pain in Lt knee Are you having pain? Yes: Pain location: bil shoulders Pain description: ache, didn't rate the pain Aggravating factors: lifting arms to side or forward  Relieving factors: not moving it  PRECAUTIONS: None  RED FLAGS: None   WEIGHT BEARING RESTRICTIONS: No  FALLS:  Has patient fallen in last 6 months? Yes. Number of falls 1- PT will address falls in PT treatment   LIVING ENVIRONMENT: Lives with: lives with their family and lives with their spouse Lives in: House/apartment Stairs: Yes: Internal: 16 steps; on right going up Has following equipment at home: Single point cane  OCCUPATION: Retired, husband is the Financial trader, read   PLOF: Independent  PATIENT GOALS: Walk up the steps with both feet, strengthen both knees  NEXT MD VISIT: Sees Oncologist in 1 month  OBJECTIVE:  Note: Objective measures were completed at Evaluation unless otherwise noted.  DIAGNOSTIC FINDINGS:   PATIENT SURVEYS:  LEFS  Extreme difficulty/unable (0), Quite a bit of difficulty (1), Moderate difficulty (2), Little difficulty (3), No difficulty (4) Survey date:  12/01/23 01/11/24  Any of your usual work, housework or school activities 3 4  2. Usual hobbies, recreational or sporting activities 3 4  3. Getting into/out of the bath 3 3  4. Walking between rooms 3 4  5. Putting on socks/shoes 3 4  6. Squatting  0 1  7. Lifting an object, like a bag of groceries from the floor 2 3  8. Performing light activities around your home 3 4  9. Performing heavy activities around your home 1 1  10. Getting into/out of a car 4 4  11. Walking 2 blocks 3 3  12. Walking 1 mile 0 0  13. Going up/down 10 stairs (1 flight) 3 1  14. Standing for 1 hour 2 2  15.  sitting for 1 hour 4 4  16. Running on even ground 0 0  17. Running on uneven ground  0 0  18. Making sharp turns while running fast 0 0  19. Hopping  0 0  20. Rolling over in bed 3 4   Score total:  40/80 46/80     COGNITION: Overall cognitive status: Within functional limits for tasks assessed     SENSATION: Not tested Neuropathy in bilateral feet at night , why pt takes gabapentin     MUSCLE LENGTH: Hamstrings: Right side tightness; Left side tightness   POSTURE: rounded shoulders, forward head, and flexed trunk   PALPATION: NA  UPPER EXTREMITY ROM: Shoulder strength: 4/5 shoulder flexion and extension, abduction 4-/5, IR/ER 3+/5 tested in neutral Shoulder mobility: limited 60% in bilateral shoul flexion/Abd/ER/IR with pain and weakness reported   LOWER EXTREMITY ROM:  Active ROM Right eval Left eval  Hip flexion WNL WNL  Hip extension Dec ROM Dec Rom  Hip abduction Dec ROM Dec ROM  Hip adduction    Hip internal rotation Dec ROM Dec ROM  Hip external rotation Dec ROM Dec ROM  Knee flexion Dec ROM Dec ROM  Knee extension Dec ROM Dec ROM  Ankle dorsiflexion    Ankle plantarflexion    Ankle inversion    Ankle eversion     (Blank rows = not tested)  LOWER EXTREMITY MMT:  MMT Right eval Left eval  Hip flexion 4- 3+  Hip extension 4- 3+  Hip abduction 4- 3+  Hip adduction    Hip internal rotation 4- 4-  Hip external rotation 4- 4-  Knee flexion 4 3+  Knee extension 4- 3+  Ankle dorsiflexion    Ankle plantarflexion    Ankle inversion    Ankle eversion     (Blank rows = not tested)  LOWER EXTREMITY SPECIAL TESTS:    FUNCTIONAL TESTS:  8/19/255 times sit to stand: 15.27 sec used UE support  Timed up and go (TUG): 19.27 no cane; 19.00 sec with cane  3 minute walk test: not complete  12/17/23:  5x sit to stand: 19.73 seconds  TUG: 19.84 seconds   12/21/23:  3 min walk: 310 feet with 3/10 RPE  01/11/24:  5x sit to stand: 18.32 seconds  TUG: 16.83 seconds with cane    GAIT: Distance walked: 300 ft  Assistive device utilized:  Single point cane Level of assistance: Min A, genu varus and antalgic gait. Comments: Pt walked from wait room across ortho gym to private room and back  TREATMENT DATE:  01/13/24: Standing heel raises 2 x 10 Standing hip extension 2 x 10 each LE Seated LAQ 5# 2x10 Seated march 5# x 20 B (1 is 1) Seated ER with 5# seated 4 x 5 each  Standing hip abduction 2 x 10 with 5# Step ups 4 inch 1x7, 1x10 bil each  Seated clam with yellow loop x 20 Seated resisted hamstring curls with green band (PT anchoring) 2 x 10 Stair training: up and down instructing on step through gait going up and step to gait going down: educated patient on avoiding step through on descending due to severity of her left knee OA.  This could result in instability and possible fall.  01/11/24: Nustep - 6 mins L5 - PT present to discuss progress  Step ups 4 inch 1x7, 1x10 bil each  Sit to stand x 10 with yellow loop around knees for encouraging hip abduction and proper alignment Seated LAQ 4# 2x10 Seated march 4# 2x5 B Standing heel raises 4#  x 20 Seated ER with 4# seated 2 x 10 each  Seated clam with yellow loop x 20 Seated resisted hamstring curls with green band (PT anchoring) 2 x 10 Supine bridge 2 x 10  Supine SLR 2 x 10 (vc's for correct technique and to do short range)  Side lying clam x 20 each side Seated bicep curls with  lbs x 20 each Seated punches with 1# 2x 10 Seated upper cuts 1# x 10 Seated shoulder ER 1# x 10 Seated tricep press with green tband 2 x 10 each side   01/04/24: Nustep - 6 mins L5 - PT present to discuss progress  TUG and 5x sit to stand Step ups 4 inch at stairs 2x6 B Sit to stand x 10 with yellow loop around knees for encouraging hip abduction and proper alignment Seated LAQ 4# 2x10 Standing march 4# 2 x 5 B Seated march 4# 2x5 B Standing heel raises 4#   x 20 Seated ER with 4# seated 1 x 10 each  Seated clam with yellow loop x 20 Seated bicep curls with 2 lbs x 20 each Seated punches with 1# 2x 10 Seated upper cuts 1# x 10 Seated Er 1# x 10 Seated tricep press with green tband 2 x 10 each side    PATIENT EDUCATION:  Education details: [email or web address] Person educated: Patient Education method: Explanation, Demonstration, Tactile cues, Verbal cues, and Handouts Education comprehension: verbalized understanding, returned demonstration, and verbal cues required  HOME EXERCISE PROGRAM: Access Code: [email or web address] URL: [email or web address] Date: 12/07/2023 Prepared by: Burnard  Exercises - Seated Hip Abduction with Resistance  - 2-3 x daily - 7 x weekly - 1-2 sets - 10 reps - Seated Quad Set  - 2-3 x daily - 7 x weekly - 1-2 sets - 5 reps - 5 hold - Seated Heel Toe Raises  - 3 x daily - 7 x weekly - 2 sets - 10 reps - Sit to Stand with Armchair  - 2 x daily - 7 x weekly - 2 sets - 5 reps - Seated Long Arc Quad  - 2 x daily - 7 x weekly - 1 sets - 10 reps - Standing March with Counter Support  - 2 x daily - 7 x weekly - 10 reps - Seated Hip Adduction Isometrics with Ball  - 2 x daily - 7 x weekly - 1 sets - 10 reps - 5 hold   ASSESSMENT:  CLINICAL IMPRESSION: Patient continues to have  left knee pain and occasional instability.  We continued to focus on proximal hip strength and quad strength today.  We added stair training as this is patient's primary goal: to be able to ascend and descend steps with reciprocal gait.  After multiple attempts it was decided that descending with step through gait would not be safe due to the severity of her left knee OA.  She is well motivated and compliant.  She has good safety awareness.  She continues to demonstrate potential for improvement and would benefit from continued skilled therapy to address impairments.    OBJECTIVE IMPAIRMENTS: Abnormal gait, cardiopulmonary status limiting activity,  decreased activity tolerance, decreased balance, decreased coordination, decreased endurance, decreased knowledge of condition, decreased mobility, difficulty walking, decreased ROM, decreased strength, impaired sensation, impaired UE functional use, improper body mechanics, postural dysfunction, and pain.   ACTIVITY LIMITATIONS: carrying, lifting, bending, standing, squatting, stairs, transfers, toileting, reach over head, and caring for others  PARTICIPATION LIMITATIONS: meal prep, cleaning, laundry, and yard work  PERSONAL FACTORS: Age, Fitness, Social background, Time since onset of injury/illness/exacerbation, and 3+ comorbidities:    REHAB POTENTIAL: Good  CLINICAL DECISION MAKING: Evolving/moderate complexity  EVALUATION COMPLEXITY: Moderate   GOALS: Goals reviewed with patient? Yes  SHORT TERM GOALS: Target date: 12/25/23 Pt will be independent with initial HEP so she can maintain PT gains and increase functional capacity.  Baseline: 01/11/24 Goal status: MET  2.  Pt will complete  3 min walk test to document her baseline.  Baseline: 310 feet (12/21/23) Goal status: MET     LONG TERM GOALS: Target date: 01/22/24  Pt will be independent with advanced HEP so she can maintain PT gains and increase functional capacity.  Baseline:  Goal status: INITIAL  2.  Pt will perform 5 Sit to stands in < or = to 11 seconds or less without UE support so she is able to perform transfers independently.  Baseline: 18.32 (01/11/24) Goal status: In progress   3.  Pt will score 4+/5 in all LE motions so she increases hip stability for improved functional capacity for navigating stairs.  Baseline:  Goal status: INITIAL  4.  Pt will complete Single limb stance for > or = to 10+ seconds bilaterally so she demonstrates improved balance for car transfers, and long standing.  Baseline:  Goal status: INITIAL  5.  Pt will complete TUG in less than or equal to 14 sec so she improves balance and  coordination so she can go down stairs safely.  Baseline:16.83 seconds  with cane (01/11/24) Goal status: In progress     PLAN:  PT FREQUENCY: 2x/week  PT DURATION: 8 weeks  PLANNED INTERVENTIONS: [id number]- PT Re-evaluation, 97750- Physical Performance Testing, 97110-Therapeutic exercises, 97530- Therapeutic activity, [id number]- Neuromuscular re-education, 97535- Self Care, [id number]- Manual therapy, [id number]- Gait training, [phone]- Orthotic/Prosthetic subsequent, [phone]- Canalith repositioning, [id number]- Aquatic Therapy, [phone]- Electrical stimulation (manual), [id number]- Ultrasound, [id number]- Traction (mechanical), [id number]- Ionotophoresis 4mg /ml Dexamethasone , 02981- Parrafin, Patient/Family education, Balance training, Stair training, Taping, Joint mobilization, Spinal mobilization, Manual lymph drainage, Scar mobilization, Compression bandaging, Vestibular training, DME instructions, Wheelchair mobility training, Cryotherapy, and Moist heat  PLAN FOR NEXT SESSION: Continue UE strengthening,  LE strengthening, flexibility, assess/update HEP to add standing tasks, balance tasks,  standing with weights or on balance pad. 3 min walk test   Delon B. Ysmael Hires, PT 01/13/24 6:08 PM [facility] [address] [place], [place] [address] Phone # [phone] Fax [phone]

## 2024-01-14 ENCOUNTER — Encounter

## 2024-01-17 NOTE — Therapy (Signed)
 OUTPATIENT PHYSICAL THERAPY LOWER EXTREMITY TREATMENT       Patient Name: Tami Schmidt MRN: [id number] DOB:[birth year], 89 y.o., female Today's Date: 01/18/2024  END OF SESSION:  PT End of Session - 01/18/24 1144     Visit Number 14    Number of Visits 16    Date for Recertification  01/26/24    Authorization Time Period 8/19 to 10/14- 16 visits    Authorization - Visit Number 14    Authorization - Number of Visits 16    Progress Note Due on Visit 20    PT Start Time 1103    PT Stop Time 1144    PT Time Calculation (min) 41 min    Activity Tolerance Patient tolerated treatment well    Behavior During Therapy WFL for tasks assessed/performed                   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   Diabetes mellitus    fasting 90-100   HTN (hypertension) 03/25/2013   Hx of radiation therapy    breasts hx   Hypercholesterolemia    Hypertension    Lymphedema of arm    left arm   Pulmonary embolism (HCC)    Type II or unspecified type diabetes mellitus without mention of complication, not stated as uncontrolled 03/25/2013   Past Surgical History:  Procedure Laterality Date   ABDOMINAL HYSTERECTOMY     ANKLE SURGERY Right 1995   APPENDECTOMY     BIOPSY  01/07/2018   Procedure: BIOPSY;  Surgeon: Saintclair Jasper, MD;  Location: [facility];  Service: Gastroenterology;;   BREAST SURGERY Bilateral    mastectomy   COLONOSCOPY WITH PROPOFOL  N/A 01/07/2018   Procedure: COLONOSCOPY WITH PROPOFOL ;  Surgeon: Saintclair Jasper, MD;  Location: [facility];  Service: Gastroenterology;  Laterality: N/A;   ESOPHAGOGASTRODUODENOSCOPY (EGD) WITH PROPOFOL  N/A 01/06/2018   Procedure: ESOPHAGOGASTRODUODENOSCOPY (EGD) WITH PROPOFOL ;  Surgeon: Saintclair Jasper, MD;  Location: [facility];  Service: Gastroenterology;  Laterality: N/A;   HERNIA REPAIR   04-26-2010   IR IMAGING GUIDED PORT INSERTION  10/29/2021   MASS EXCISION Left 01/26/2013   Procedure: EXCISION LEFT CHEST WALL MASS AND LEFT ABDOMNAL WALL MASS;  Surgeon: Vicenta DELENA Poli, MD;  Location: [facility];  Service: General;  Laterality: Left;   MASS EXCISION Left 09/20/2014   Procedure: EXCISION OF LEFT CHEST WALL MASS;  Surgeon: Vicenta Poli, MD;  Location: [facility];  Service: General;  Laterality: Left;   TEE WITHOUT CARDIOVERSION N/A 12/08/2017   Procedure: TRANSESOPHAGEAL ECHOCARDIOGRAM (TEE);  Surgeon: Pietro Redell RAMAN, MD;  Location: [facility];  Service: Cardiovascular;  Laterality: N/A;   TOTAL HIP ARTHROPLASTY Right 01/05/2017   TOTAL HIP ARTHROPLASTY Right 01/05/2017   Procedure: TOTAL HIP ARTHROPLASTY ANTERIOR APPROACH;  Surgeon: Liam Lerner, MD;  Location: [facility];  Service: Orthopedics;  Laterality: Right;   Patient Active Problem List   Diagnosis Date Noted   Acute lower GI bleeding 01/07/2023   Rectal bleeding 01/05/2023   Acute GI bleeding 10/04/2022   Port-A-Cath in place 06/12/2022   Loss of perception for taste 03/17/2022   Primary osteoarthritis involving multiple joints 03/17/2022   Genetic testing 07/24/2021   Microcytosis 04/29/2021   Hypokalemia 04/28/2021   Pulmonary embolism (HCC)    Agnosia 08/09/2020  Hardening of the aorta (main artery of the heart) 08/09/2020   Morbid obesity (HCC) 08/09/2020   Neuropathy 08/09/2020   Primary osteoarthritis 08/09/2020   Sensorineural hearing loss (SNHL) of both ears 08/07/2020   Anosmia 06/05/2020   Tinnitus, bilateral 06/05/2020   GI bleed 07/13/2019   ABLA (acute blood loss anemia) 07/12/2019   Microcytic anemia 01/05/2018   Melena 01/04/2018   Lower GI bleed 01/04/2018   Diabetes mellitus type II, controlled, with no complications (HCC) 12/05/2017   Tenosynovitis of left wrist 12/02/2017   Infection of left wrist (HCC) 11/30/2017   Primary osteoarthritis of right hip 01/05/2017    Osteoarthritis of right hip 01/03/2017   Lymphedema of upper extremity 01/17/2014   Osteopenia 01/17/2014   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   Dilated pore of [place] 08/02/2013   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 08/02/2013   Scar 08/02/2013   Malignant neoplasm of lower-inner quadrant of left breast in female, estrogen receptor positive (HCC) 06/16/2013   Type II or unspecified type diabetes mellitus without mention of complication, not stated as uncontrolled 03/25/2013   Essential hypertension 03/25/2013   Chest wall recurrence of breast cancer (HCC) 02/21/2013   Abdominal wall mass 01/14/2013    PCP: Following with cancer center   REFERRING PROVIDER: Joane Birmingham, MD  REFERRING DIAG: Chronic pain of both shoulders, Chronic pain of left knee, Balance problem   THERAPY DIAG:  Left knee pain, unspecified chronicity  Primary osteoarthritis of shoulders, bilateral  Difficulty in walking, not elsewhere classified  Muscle weakness (generalized)  Abnormal posture  Rationale for Evaluation and Treatment: Rehabilitation  ONSET DATE: 3-4 years ago  SUBJECTIVE:   SUBJECTIVE STATEMENT: I'm getting stronger     PERTINENT HISTORY: 1993: Right lumpectomy axillary dissection in [place]  (stage I) treated with radiation and tamoxifen  x 5 years  2002: Right mastectomy: Right breast recurrence  2004: Left mastectomy: 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 Pts husband has  wrapped before and she has a night garment  PAIN:   01/11/24   0/10 pain in Lt knee Are you having pain? Yes: Pain location: bil shoulders Pain description: ache, didn't rate the pain Aggravating factors: lifting arms to side or forward  Relieving factors: not moving it  PRECAUTIONS: None  RED FLAGS: None   WEIGHT BEARING RESTRICTIONS: No  FALLS:  Has patient fallen in last 6 months? Yes. Number of falls 1- PT will address falls in PT treatment   LIVING ENVIRONMENT: Lives with: lives with their family and lives with their spouse Lives in: House/apartment Stairs: Yes: Internal: 16 steps; on right going up Has following equipment at home: Single point cane  OCCUPATION: Retired, husband is the Financial trader, read   PLOF: Independent  PATIENT GOALS: Walk up the steps with both feet, strengthen both knees  NEXT MD VISIT: Sees Oncologist in 1 month  OBJECTIVE:  Note: Objective measures were completed at Evaluation unless otherwise noted.  DIAGNOSTIC FINDINGS:   PATIENT SURVEYS:  LEFS  Extreme difficulty/unable (0), Quite a bit of difficulty (1), Moderate  difficulty (2), Little difficulty (3), No difficulty (4) Survey date:  12/01/23 01/11/24  Any of your usual work, housework or school activities 3 4  2. Usual hobbies, recreational or sporting activities 3 4  3. Getting into/out of the bath 3 3  4. Walking between rooms 3 4  5. Putting on socks/shoes 3 4  6. Squatting  0 1  7. Lifting an object, like a bag of groceries from the floor 2 3  8. Performing light activities around your home 3 4  9. Performing heavy activities around your home 1 1  10. Getting into/out of a car 4 4  11. Walking 2 blocks 3 3  12. Walking 1 mile 0 0  13. Going up/down 10 stairs (1 flight) 3 1  14. Standing for 1 hour 2 2  15.  sitting for 1 hour 4 4  16. Running on even ground 0 0  17. Running on uneven ground 0 0  18. Making sharp turns while running fast 0 0  19. Hopping  0 0  20.  Rolling over in bed 3 4   Score total:  40/80 46/80     COGNITION: Overall cognitive status: Within functional limits for tasks assessed     SENSATION: Not tested Neuropathy in bilateral feet at night , why pt takes gabapentin     MUSCLE LENGTH: Hamstrings: Right side tightness; Left side tightness   POSTURE: rounded shoulders, forward head, and flexed trunk   PALPATION: NA  UPPER EXTREMITY ROM: Shoulder strength: 4/5 shoulder flexion and extension, abduction 4-/5, IR/ER 3+/5 tested in neutral Shoulder mobility: limited 60% in bilateral shoul flexion/Abd/ER/IR with pain and weakness reported   LOWER EXTREMITY ROM:  Active ROM Right eval Left eval  Hip flexion WNL WNL  Hip extension Dec ROM Dec Rom  Hip abduction Dec ROM Dec ROM  Hip adduction    Hip internal rotation Dec ROM Dec ROM  Hip external rotation Dec ROM Dec ROM  Knee flexion Dec ROM Dec ROM  Knee extension Dec ROM Dec ROM  Ankle dorsiflexion    Ankle plantarflexion    Ankle inversion    Ankle eversion     (Blank rows = not tested)  LOWER EXTREMITY MMT:  MMT Right eval Left eval  Hip flexion 4- 3+  Hip extension 4- 3+  Hip abduction 4- 3+  Hip adduction    Hip internal rotation 4- 4-  Hip external rotation 4- 4-  Knee flexion 4 3+  Knee extension 4- 3+  Ankle dorsiflexion    Ankle plantarflexion    Ankle inversion    Ankle eversion     (Blank rows = not tested)  LOWER EXTREMITY SPECIAL TESTS:    FUNCTIONAL TESTS:  8/19/255 times sit to stand: 15.27 sec used UE support  Timed up and go (TUG): 19.27 no cane; 19.00 sec with cane  3 minute walk test: not complete  12/17/23:  5x sit to stand: 19.73 seconds  TUG: 19.84 seconds   12/21/23:  3 min walk: 310 feet with 3/10 RPE  01/11/24:  5x sit to stand: 18.32 seconds  TUG: 16.83 seconds with cane    GAIT: Distance walked: 300 ft  Assistive device utilized: Single point cane Level of assistance: Min A, genu varus and antalgic  gait. Comments: Pt walked from wait room across ortho gym to private room and back  TREATMENT DATE:   01/18/24: Nustep - 6 mins L5 - LEGS ONLY PT present to discuss progress  Standing heel raises 2 x 10 Standing hip extension 2 x 10 each LE Seated LAQ 5# 2x10 Seated march 5# x 20 B (1 is 1) Seated ER with 5# seated 2x10  Sit stand from chair + foam x 10 Standing hip abduction 2 x 10 with 5# Seated clam with red + black loops x 20 Seated resisted hamstring curls with blue band (PT anchoring) 2 x 10 Step ups 4 inch 1x10 bil each, Step ups 6 inch x 10 B   01/13/24: Standing heel raises 2 x 10 Standing hip extension 2 x 10 each LE Seated LAQ 5# 2x10 Seated march 5# x 20 B (1 is 1) Seated ER with 5# seated 4 x 5 each  Standing hip abduction 2 x 10 with 5# Step ups 4 inch 1x7, 1x10 bil each  Seated clam with yellow loop x 20 Seated resisted hamstring curls with green band (PT anchoring) 2 x 10 Stair training: up and down instructing on step through gait going up and step to gait going down: educated patient on avoiding step through on descending due to severity of her left knee OA.  This could result in instability and possible fall.  01/11/24: Nustep - 6 mins L5 - PT present to discuss progress  Step ups 4 inch 1x7, 1x10 bil each  Sit to stand x 10 with yellow loop around knees for encouraging hip abduction and proper alignment Seated LAQ 4# 2x10 Seated march 4# 2x5 B Standing heel raises 4#  x 20 Seated ER with 4# seated 2 x 10 each  Seated clam with yellow loop x 20 Seated resisted hamstring curls with green band (PT anchoring) 2 x 10 Supine bridge 2 x 10  Supine SLR 2 x 10 (vc's for correct technique and to do short range)  Side lying clam x 20 each side Seated bicep curls with  lbs x 20 each Seated punches with 1# 2x 10 Seated upper cuts 1# x  10 Seated shoulder ER 1# x 10 Seated tricep press with green tband 2 x 10 each side   01/04/24: Nustep - 6 mins L5 - PT present to discuss progress  TUG and 5x sit to stand Step ups 4 inch at stairs 2x6 B Sit to stand x 10 with yellow loop around knees for encouraging hip abduction and proper alignment Seated LAQ 4# 2x10 Standing march 4# 2 x 5 B Seated march 4# 2x5 B Standing heel raises 4#  x 20 Seated ER with 4# seated 1 x 10 each  Seated clam with yellow loop x 20 Seated bicep curls with 2 lbs x 20 each Seated punches with 1# 2x 10 Seated upper cuts 1# x 10 Seated Er 1# x 10 Seated tricep press with green tband 2 x 10 each side    PATIENT EDUCATION:  Education details: [email or web address] Person educated: Patient Education method: Explanation, Demonstration, Tactile cues, Verbal cues, and Handouts Education comprehension: verbalized understanding, returned demonstration, and verbal cues required  HOME EXERCISE PROGRAM: Access Code: [email or web address] URL: [email or web address] Date: 12/07/2023 Prepared by: Burnard  Exercises - Seated Hip Abduction with Resistance  - 2-3 x daily - 7 x weekly - 1-2 sets - 10 reps - Seated Quad Set  - 2-3 x daily - 7 x weekly - 1-2 sets - 5 reps - 5 hold - Seated Heel Toe Raises  - 3 x daily -  7 x weekly - 2 sets - 10 reps - Sit to Stand with Armchair  - 2 x daily - 7 x weekly - 2 sets - 5 reps - Seated Long Arc Quad  - 2 x daily - 7 x weekly - 1 sets - 10 reps - Standing March with Counter Support  - 2 x daily - 7 x weekly - 10 reps - Seated Hip Adduction Isometrics with Ball  - 2 x daily - 7 x weekly - 1 sets - 10 reps - 5 hold   ASSESSMENT:  CLINICAL IMPRESSION: Satoria continues to report improvements in function at home especially with sit to stands. She was able to progress with resistance with many exercises today and a blue band was issued for her seated clams. She was able to tolerate 6 inch step ups on the left LE today after warm up  on the 4 inch step. She has two more visits and should be ready for discharge.  OBJECTIVE IMPAIRMENTS: Abnormal gait, cardiopulmonary status limiting activity, decreased activity tolerance, decreased balance, decreased coordination, decreased endurance, decreased knowledge of condition, decreased mobility, difficulty walking, decreased ROM, decreased strength, impaired sensation, impaired UE functional use, improper body mechanics, postural dysfunction, and pain.   ACTIVITY LIMITATIONS: carrying, lifting, bending, standing, squatting, stairs, transfers, toileting, reach over head, and caring for others  PARTICIPATION LIMITATIONS: meal prep, cleaning, laundry, and yard work  PERSONAL FACTORS: Age, Fitness, Social background, Time since onset of injury/illness/exacerbation, and 3+ comorbidities:    REHAB POTENTIAL: Good  CLINICAL DECISION MAKING: Evolving/moderate complexity  EVALUATION COMPLEXITY: Moderate   GOALS: Goals reviewed with patient? Yes  SHORT TERM GOALS: Target date: 12/25/23 Pt will be independent with initial HEP so she can maintain PT gains and increase functional capacity.  Baseline: 01/11/24 Goal status: MET  2.  Pt will complete  3 min walk test to document her baseline.  Baseline: 310 feet (12/21/23) Goal status: MET     LONG TERM GOALS: Target date: 01/22/24  Pt will be independent with advanced HEP so she can maintain PT gains and increase functional capacity.  Baseline:  Goal status: INITIAL  2.  Pt will perform 5 Sit to stands in < or = to 11 seconds or less without UE support so she is able to perform transfers independently.  Baseline: 18.32 (01/11/24) Goal status: In progress   3.  Pt will score 4+/5 in all LE motions so she increases hip stability for improved functional capacity for navigating stairs.  Baseline:  Goal status: INITIAL  4.  Pt will complete Single limb stance for > or = to 10+ seconds bilaterally so she demonstrates improved balance  for car transfers, and long standing.  Baseline:  Goal status: INITIAL  5.  Pt will complete TUG in less than or equal to 14 sec so she improves balance and coordination so she can go down stairs safely.  Baseline:16.83 seconds  with cane (01/11/24) Goal status: In progress     PLAN:  PT FREQUENCY: 2x/week  PT DURATION: 8 weeks  PLANNED INTERVENTIONS: [id number]- PT Re-evaluation, 97750- Physical Performance Testing, 97110-Therapeutic exercises, 97530- Therapeutic activity, [id number]- Neuromuscular re-education, 97535- Self Care, [id number]- Manual therapy, [id number]- Gait training, [phone]- Orthotic/Prosthetic subsequent, [phone]- Canalith repositioning, [id number]- Aquatic Therapy, [phone]- Electrical stimulation (manual), [id number]- Ultrasound, [id number]- Traction (mechanical), [id number]- Ionotophoresis 4mg /ml Dexamethasone , [id number]- Parrafin, Patient/Family education, Balance training, Stair training, Taping, Joint mobilization, Spinal mobilization, Manual lymph drainage, Scar mobilization, Compression bandaging, Vestibular training, DME instructions, Wheelchair mobility training, Cryotherapy, and  Moist heat  PLAN FOR NEXT SESSION: Continue UE strengthening,  LE strengthening, flexibility, assess/update HEP to add standing tasks, balance tasks,  standing with weights or on balance pad. 3 min walk test   Mliss Cummins, PT  01/18/24 1:37 PM [address] [facility] [address] [place], [place] [address] Phone # [phone] Fax [phone]

## 2024-01-18 ENCOUNTER — Ambulatory Visit: Admitting: Physical Therapy

## 2024-01-18 ENCOUNTER — Encounter: Payer: Self-pay | Admitting: Physical Therapy

## 2024-01-18 DIAGNOSIS — R262 Difficulty in walking, not elsewhere classified: Secondary | ICD-10-CM

## 2024-01-18 DIAGNOSIS — M6281 Muscle weakness (generalized): Secondary | ICD-10-CM

## 2024-01-18 DIAGNOSIS — M25562 Pain in left knee: Secondary | ICD-10-CM | POA: Diagnosis not present

## 2024-01-18 DIAGNOSIS — R293 Abnormal posture: Secondary | ICD-10-CM

## 2024-01-18 DIAGNOSIS — M19011 Primary osteoarthritis, right shoulder: Secondary | ICD-10-CM

## 2024-01-20 ENCOUNTER — Ambulatory Visit (HOSPITAL_COMMUNITY)
Admission: RE | Admit: 2024-01-20 | Discharge: 2024-01-20 | Disposition: A | Discharge status: Home / Self Care | Source: Ambulatory Visit | Attending: Hematology and Oncology | Admitting: Hematology and Oncology

## 2024-01-20 DIAGNOSIS — C50312 Malignant neoplasm of lower-inner quadrant of left female breast: Secondary | ICD-10-CM | POA: Diagnosis present

## 2024-01-20 DIAGNOSIS — Z170 Estrogen receptor positive status [ER+]: Secondary | ICD-10-CM | POA: Diagnosis present

## 2024-01-20 MED ORDER — IOHEXOL 300 MG/ML  SOLN
100.0000 mL | Freq: Once | INTRAMUSCULAR | Status: AC | PRN
  Administered 2024-01-20: 100 mL via INTRAVENOUS

## 2024-01-20 MED ORDER — SODIUM CHLORIDE (PF) 0.9 % IJ SOLN
INTRAMUSCULAR | Status: AC
  Filled 2024-01-20: qty 50

## 2024-01-20 MED ORDER — HEPARIN SOD (PORK) LOCK FLUSH 100 UNIT/ML IV SOLN
500.0000 [IU] | Freq: Once | INTRAVENOUS | Status: AC
  Administered 2024-01-20: 500 [IU] via INTRAVENOUS

## 2024-01-20 MED ORDER — HEPARIN SOD (PORK) LOCK FLUSH 100 UNIT/ML IV SOLN
INTRAVENOUS | Status: AC
  Filled 2024-01-20: qty 5

## 2024-01-21 ENCOUNTER — Encounter: Payer: Self-pay | Admitting: Hematology and Oncology

## 2024-01-21 ENCOUNTER — Ambulatory Visit

## 2024-01-21 DIAGNOSIS — M19011 Primary osteoarthritis, right shoulder: Secondary | ICD-10-CM

## 2024-01-21 DIAGNOSIS — R293 Abnormal posture: Secondary | ICD-10-CM

## 2024-01-21 DIAGNOSIS — M6281 Muscle weakness (generalized): Secondary | ICD-10-CM

## 2024-01-21 DIAGNOSIS — M25562 Pain in left knee: Secondary | ICD-10-CM

## 2024-01-21 DIAGNOSIS — R252 Cramp and spasm: Secondary | ICD-10-CM

## 2024-01-21 DIAGNOSIS — R262 Difficulty in walking, not elsewhere classified: Secondary | ICD-10-CM

## 2024-01-21 NOTE — Telephone Encounter (Signed)
 error

## 2024-01-21 NOTE — Therapy (Signed)
 OUTPATIENT PHYSICAL THERAPY LOWER EXTREMITY TREATMENT       Patient Name: Tami Schmidt MRN: [id number] DOB:01-Feb-1933, 89 y.o., female Today's Date: 01/21/2024  END OF SESSION:  PT End of Session - 01/21/24 1149     Visit Number 15    Date for Recertification  01/26/24    Authorization Type [employer]    Authorization Time Period 8/19 to 10/14- 16 visits    Authorization - Visit Number 15    Authorization - Number of Visits 16    PT Start Time 1103    PT Stop Time 1143    PT Time Calculation (min) 40 min    Activity Tolerance Patient tolerated treatment well    Behavior During Therapy WFL for tasks assessed/performed                    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   Diabetes mellitus    fasting 90-100   HTN (hypertension) 03/25/2013   Hx of radiation therapy    breasts hx   Hypercholesterolemia    Hypertension    Lymphedema of arm    left arm   Pulmonary embolism (HCC)    Type II or unspecified type diabetes mellitus without mention of complication, not stated as uncontrolled 03/25/2013   Past Surgical History:  Procedure Laterality Date   ABDOMINAL HYSTERECTOMY     ANKLE SURGERY Right 1995   APPENDECTOMY     BIOPSY  01/07/2018   Procedure: BIOPSY;  Surgeon: Saintclair Jasper, MD;  Location: [facility];  Service: Gastroenterology;;   BREAST SURGERY Bilateral    mastectomy   COLONOSCOPY WITH PROPOFOL  N/A 01/07/2018   Procedure: COLONOSCOPY WITH PROPOFOL ;  Surgeon: Saintclair Jasper, MD;  Location: [facility];  Service: Gastroenterology;  Laterality: N/A;   ESOPHAGOGASTRODUODENOSCOPY (EGD) WITH PROPOFOL  N/A 01/06/2018   Procedure: ESOPHAGOGASTRODUODENOSCOPY (EGD) WITH PROPOFOL ;  Surgeon: Saintclair Jasper, MD;  Location: [facility];  Service: Gastroenterology;  Laterality: N/A;   HERNIA REPAIR  04-26-2010   IR IMAGING  GUIDED PORT INSERTION  10/29/2021   MASS EXCISION Left 01/26/2013   Procedure: EXCISION LEFT CHEST WALL MASS AND LEFT ABDOMNAL WALL MASS;  Surgeon: Vicenta DELENA Poli, MD;  Location: [facility];  Service: General;  Laterality: Left;   MASS EXCISION Left 09/20/2014   Procedure: EXCISION OF LEFT CHEST WALL MASS;  Surgeon: Vicenta Poli, MD;  Location: [facility];  Service: General;  Laterality: Left;   TEE WITHOUT CARDIOVERSION N/A 12/08/2017   Procedure: TRANSESOPHAGEAL ECHOCARDIOGRAM (TEE);  Surgeon: Pietro Redell RAMAN, MD;  Location: [facility];  Service: Cardiovascular;  Laterality: N/A;   TOTAL HIP ARTHROPLASTY Right 01/05/2017   TOTAL HIP ARTHROPLASTY Right 01/05/2017   Procedure: TOTAL HIP ARTHROPLASTY ANTERIOR APPROACH;  Surgeon: Liam Lerner, MD;  Location: [facility];  Service: Orthopedics;  Laterality: Right;   Patient Active Problem List   Diagnosis Date Noted   Acute lower GI bleeding 01/07/2023   Rectal bleeding 01/05/2023   Acute GI bleeding 10/04/2022   Port-A-Cath in place 06/12/2022   Loss of perception for taste 03/17/2022   Primary osteoarthritis involving multiple joints 03/17/2022   Genetic testing 07/24/2021   Microcytosis 04/29/2021   Hypokalemia 04/28/2021   Pulmonary embolism (HCC)    Agnosia 08/09/2020   Hardening of the aorta (main artery of the  heart) 08/09/2020   Morbid obesity (HCC) 08/09/2020   Neuropathy 08/09/2020   Primary osteoarthritis 08/09/2020   Sensorineural hearing loss (SNHL) of both ears 08/07/2020   Anosmia 06/05/2020   Tinnitus, bilateral 06/05/2020   GI bleed 07/13/2019   ABLA (acute blood loss anemia) 07/12/2019   Microcytic anemia 01/05/2018   Melena 01/04/2018   Lower GI bleed 01/04/2018   Diabetes mellitus type II, controlled, with no complications (HCC) 12/05/2017   Tenosynovitis of left wrist 12/02/2017   Infection of left wrist (HCC) 11/30/2017   Primary osteoarthritis of right hip 01/05/2017   Osteoarthritis of right hip  01/03/2017   Lymphedema of upper extremity 01/17/2014   Osteopenia 01/17/2014   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   Dilated pore of Winer 08/02/2013   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 08/02/2013   Scar 08/02/2013   Malignant neoplasm of lower-inner quadrant of left breast in female, estrogen receptor positive (HCC) 06/16/2013   Type II or unspecified type diabetes mellitus without mention of complication, not stated as uncontrolled 03/25/2013   Essential hypertension 03/25/2013   Chest wall recurrence of breast cancer (HCC) 02/21/2013   Abdominal wall mass 01/14/2013    PCP: Following with cancer center   REFERRING PROVIDER: Joane Birmingham, MD  REFERRING DIAG: Chronic pain of both shoulders, Chronic pain of left knee, Balance problem   THERAPY DIAG:  Left knee pain, unspecified chronicity  Primary osteoarthritis of shoulders, bilateral  Difficulty in walking, not elsewhere classified  Muscle weakness (generalized)  Abnormal posture  Cramp and spasm  Rationale for Evaluation and Treatment: Rehabilitation  ONSET DATE: 3-4 years ago  SUBJECTIVE:   SUBJECTIVE STATEMENT: My Lt arm has been swollen, wearing my glove today.  I think I'll be done with PT next week.    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 Pts husband has wrapped before and she has a night garment  PAIN:   01/11/24   0/10 pain in Lt knee Are you having pain? Yes: Pain location: bil shoulders Pain description: ache, didn't rate the pain Aggravating factors: lifting arms to side or forward  Relieving factors: not moving it  PRECAUTIONS: None  RED FLAGS: None   WEIGHT BEARING RESTRICTIONS: No  FALLS:  Has patient fallen in last 6 months? Yes. Number of falls 1- PT will address falls in PT treatment   LIVING ENVIRONMENT: Lives with: lives with their family and lives with their spouse Lives in: House/apartment Stairs: Yes: Internal: 16 steps; on right going up Has following equipment at home: Single point cane  OCCUPATION: Retired, husband is the Financial trader, read   PLOF: Independent  PATIENT GOALS: Walk up the steps with both feet, strengthen both knees  NEXT MD VISIT: Sees Oncologist in 1 month  OBJECTIVE:  Note: Objective measures were completed at Evaluation unless otherwise noted.  DIAGNOSTIC FINDINGS:   PATIENT SURVEYS:  LEFS  Extreme difficulty/unable (0), Quite a bit of difficulty (1), Moderate difficulty (2), Little difficulty (3), No difficulty (4) Survey date:  12/01/23 01/11/24  Any of your usual work, housework or school activities 3 4  2. Usual hobbies, recreational or sporting activities 3 4  3. Getting into/out of the bath 3 3  4. Walking between rooms 3 4  5. Putting on socks/shoes 3 4  6. Squatting  0 1  7. Lifting an object, like a bag of groceries from the floor 2 3  8. Performing light activities around your home 3 4  9. Performing heavy activities around your home 1 1  10. Getting into/out of a car 4 4  11. Walking 2 blocks 3 3  12. Walking 1 mile 0 0  13. Going up/down 10 stairs (1 flight) 3 1  14. Standing for 1 hour 2 2  15.  sitting for 1 hour 4 4  16. Running on even ground 0 0  17. Running on uneven ground 0 0  18. Making  sharp turns while running fast 0 0  19. Hopping  0 0  20. Rolling over in bed 3 4   Score total:  40/80 46/80     COGNITION: Overall cognitive status: Within functional limits for tasks assessed     SENSATION: Not tested Neuropathy in bilateral feet at night , why pt takes gabapentin     MUSCLE LENGTH: Hamstrings: Right side tightness; Left side tightness   POSTURE: rounded shoulders, forward head, and flexed trunk   PALPATION: NA  UPPER EXTREMITY ROM: Shoulder strength: 4/5 shoulder flexion and extension, abduction 4-/5, IR/ER 3+/5 tested in neutral Shoulder mobility: limited 60% in bilateral shoul flexion/Abd/ER/IR with pain and weakness reported   LOWER EXTREMITY ROM:  Active ROM Right eval Left eval  Hip flexion WNL WNL  Hip extension Dec ROM Dec Rom  Hip abduction Dec ROM Dec ROM  Hip adduction    Hip internal rotation Dec ROM Dec ROM  Hip external rotation Dec ROM Dec ROM  Knee flexion Dec ROM Dec ROM  Knee extension Dec ROM Dec ROM  Ankle dorsiflexion    Ankle plantarflexion    Ankle inversion    Ankle eversion     (Blank rows = not tested)  LOWER EXTREMITY MMT:  MMT Right eval Left eval  Hip flexion 4- 3+  Hip extension 4- 3+  Hip abduction 4- 3+  Hip adduction    Hip internal rotation 4- 4-  Hip external rotation 4- 4-  Knee flexion 4 3+  Knee extension 4- 3+  Ankle dorsiflexion    Ankle plantarflexion    Ankle inversion    Ankle eversion     (Blank rows = not tested)  LOWER EXTREMITY SPECIAL TESTS:    FUNCTIONAL TESTS:  8/19/255 times sit to stand: 15.27 sec used UE support  Timed up and go (TUG): 19.27 no cane; 19.00 sec with cane  3 minute walk test: not complete  12/17/23:  5x sit to stand: 19.73 seconds  TUG: 19.84 seconds   12/21/23:  3 min walk: 310 feet with 3/10 RPE  01/11/24:  5x sit to stand: 18.32 seconds  TUG: 16.83 seconds with cane    GAIT: Distance walked: 300 ft  Assistive device utilized: Single point  cane Level of assistance: Min A, genu varus and antalgic gait. Comments: Pt walked from wait room across ortho gym to private room and back  TREATMENT DATE:  01/21/24: Nustep - 6 mins L5 - legs and Rt UE only-PT present to discuss progress Standing heel raises 2 x 10 Standing hip extension 2 x 10 each LE Seated LAQ 5# 2x10 Seated march 5# x 20 B (1 is 1) Seated ER with 5# seated 2x10  Sit stand from chair + foam x 10 Standing hip abduction 2 x 10 with 5# Seated clam with green theraband loop x 20 Seated resisted hamstring curls with blue band (PT anchoring) 2 x 10   01/18/24: Nustep - 6 mins L5 - LEGS ONLY PT present to discuss progress  Standing heel raises 2 x 10 Standing hip extension 2 x 10 each LE Seated LAQ 5# 2x10 Seated march 5# x 20 B (1 is 1) Seated ER with 5# seated 2x10  Sit stand from chair + foam x 10 Standing hip abduction 2 x 10 with 5# Seated clam with red + black loops x 20 Seated resisted hamstring curls with blue band (PT anchoring) 2 x 10 Step ups 4 inch 1x10 bil each, Step ups 6 inch x 10 B   01/13/24: Standing heel raises 2 x 10 Standing hip extension 2 x 10 each LE Seated LAQ 5# 2x10 Seated march 5# x 20 B (1 is 1) Seated ER with 5# seated 4 x 5 each  Standing hip abduction 2 x 10 with 5# Step ups 4 inch 1x7, 1x10 bil each  Seated clam with yellow loop x 20 Seated resisted hamstring curls with green band (PT anchoring) 2 x 10 Stair training: up and down instructing on step through gait going up and step to gait going down: educated patient on avoiding step through on descending due to severity of her left knee OA.  This could result in instability and possible fall.   PATIENT EDUCATION:  Education details: [email or web address] Person educated: Patient Education method: Explanation, Demonstration, Actor cues, Verbal cues, and  Handouts Education comprehension: verbalized understanding, returned demonstration, and verbal cues required  HOME EXERCISE PROGRAM: Access Code: [email or web address] URL: [email or web address] Date: 12/07/2023 Prepared by: Burnard  Exercises - Seated Hip Abduction with Resistance  - 2-3 x daily - 7 x weekly - 1-2 sets - 10 reps - Seated Quad Set  - 2-3 x daily - 7 x weekly - 1-2 sets - 5 reps - 5 hold - Seated Heel Toe Raises  - 3 x daily - 7 x weekly - 2 sets - 10 reps - Sit to Stand with Armchair  - 2 x daily - 7 x weekly - 2 sets - 5 reps - Seated Long Arc Quad  - 2 x daily - 7 x weekly - 1 sets - 10 reps - Standing March with Counter Support  - 2 x daily - 7 x weekly - 10 reps - Seated Hip Adduction Isometrics with Ball  - 2 x daily - 7 x weekly - 1 sets - 10 reps - 5 hold   ASSESSMENT:  CLINICAL IMPRESSION: Pt tolerated exercises well today.  She would like to D/C next visit to HEP and will continue to work on strength and mobility.  PT present to monitor and to provide cueing throughout session.  Patient will benefit from skilled PT to address the below impairments and improve overall function.   OBJECTIVE IMPAIRMENTS: Abnormal gait, cardiopulmonary status limiting activity, decreased activity tolerance, decreased balance, decreased coordination, decreased endurance, decreased knowledge of condition, decreased mobility, difficulty walking, decreased ROM, decreased strength, impaired sensation, impaired UE functional use, improper  body mechanics, postural dysfunction, and pain.   ACTIVITY LIMITATIONS: carrying, lifting, bending, standing, squatting, stairs, transfers, toileting, reach over head, and caring for others  PARTICIPATION LIMITATIONS: meal prep, cleaning, laundry, and yard work  PERSONAL FACTORS: Age, Fitness, Social background, Time since onset of injury/illness/exacerbation, and 3+ comorbidities:    REHAB POTENTIAL: Good  CLINICAL DECISION MAKING: Evolving/moderate  complexity  EVALUATION COMPLEXITY: Moderate   GOALS: Goals reviewed with patient? Yes  SHORT TERM GOALS: Target date: 12/25/23 Pt will be independent with initial HEP so she can maintain PT gains and increase functional capacity.  Baseline: 01/11/24 Goal status: MET  2.  Pt will complete  3 min walk test to document her baseline.  Baseline: 310 feet (12/21/23) Goal status: MET     LONG TERM GOALS: Target date: 01/22/24  Pt will be independent with advanced HEP so she can maintain PT gains and increase functional capacity.  Baseline:  Goal status: INITIAL  2.  Pt will perform 5 Sit to stands in < or = to 11 seconds or less without UE support so she is able to perform transfers independently.  Baseline: 18.32 (01/11/24) Goal status: In progress   3.  Pt will score 4+/5 in all LE motions so she increases hip stability for improved functional capacity for navigating stairs.  Baseline:  Goal status: INITIAL  4.  Pt will complete Single limb stance for > or = to 10+ seconds bilaterally so she demonstrates improved balance for car transfers, and long standing.  Baseline:  Goal status: INITIAL  5.  Pt will complete TUG in less than or equal to 14 sec so she improves balance and coordination so she can go down stairs safely.  Baseline:16.83 seconds  with cane (01/11/24) Goal status: In progress     PLAN:  PT FREQUENCY: 2x/week  PT DURATION: 8 weeks  PLANNED INTERVENTIONS: [id number]- PT Re-evaluation, 97750- Physical Performance Testing, 97110-Therapeutic exercises, 97530- Therapeutic activity, [id number]- Neuromuscular re-education, 97535- Self Care, 02859- Manual therapy, [id number]- Gait training, [phone]- Orthotic/Prosthetic subsequent, [phone]- Canalith repositioning, [id number]- Aquatic Therapy, [phone]- Electrical stimulation (manual), [id number]- Ultrasound, [id number]- Traction (mechanical), [id number]- Ionotophoresis 4mg /ml Dexamethasone , 02981- Parrafin, Patient/Family education, Balance training, Stair  training, Taping, Joint mobilization, Spinal mobilization, Manual lymph drainage, Scar mobilization, Compression bandaging, Vestibular training, DME instructions, Wheelchair mobility training, Cryotherapy, and Moist heat  PLAN FOR NEXT SESSION: 1 more session probable.  Plan D/C next visit  Burnard Joy, PT 01/21/24 11:52 AM  [facility] [address] [place], [place] [address] Phone # [phone] Fax [phone]

## 2024-01-26 ENCOUNTER — Telehealth: Payer: Self-pay | Admitting: Hematology and Oncology

## 2024-01-26 ENCOUNTER — Encounter: Payer: Self-pay | Admitting: Sports Medicine

## 2024-01-26 ENCOUNTER — Encounter

## 2024-01-26 ENCOUNTER — Ambulatory Visit (INDEPENDENT_AMBULATORY_CARE_PROVIDER_SITE_OTHER): Admitting: Sports Medicine

## 2024-01-26 VITALS — BP 128/76 | HR 71 | Temp 98.1°F | Ht 67.32 in | Wt 174.8 lb

## 2024-01-26 DIAGNOSIS — I10 Essential (primary) hypertension: Secondary | ICD-10-CM

## 2024-01-26 DIAGNOSIS — D649 Anemia, unspecified: Secondary | ICD-10-CM

## 2024-01-26 DIAGNOSIS — C50312 Malignant neoplasm of lower-inner quadrant of left female breast: Secondary | ICD-10-CM | POA: Diagnosis not present

## 2024-01-26 DIAGNOSIS — M159 Polyosteoarthritis, unspecified: Secondary | ICD-10-CM

## 2024-01-26 DIAGNOSIS — Z170 Estrogen receptor positive status [ER+]: Secondary | ICD-10-CM

## 2024-01-26 DIAGNOSIS — G629 Polyneuropathy, unspecified: Secondary | ICD-10-CM

## 2024-01-26 NOTE — Progress Notes (Signed)
 Careteam: Patient Care Team: Sherlynn Madden, MD as PCP - General (Internal Medicine) Irving Delinda ORN, MD as Consulting Physician (Family Medicine) Loretha Ash, MD as Consulting Physician (Hematology and Oncology) Joane Artist RAMAN, MD as Consulting Physician (Family Medicine)  PLACE OF SERVICE:  [facility]  Advanced Directive information    Allergies  Allergen Reactions   Bactrim Swelling    SWELLING OF MOUTH/FACE.   Lisinopril Swelling    SWELLING OF MOUTH/FACE.   Vasotec Swelling    SWELLING OF MOUTH/FACE.   Sulfamethoxazole-Trimethoprim     Other reaction(s): Unknown    Chief Complaint  Patient presents with   Establish Care    New Patient  Declined Covid and Flu Vaccines for now  Eye dr Dr. Octavia.      Discussed the use of AI scribe software for clinical note transcription with the patient, who gave verbal consent to proceed.  History of Present Illness  Tami Schmidt is an 89 year old female who presents for establishment of care and management of her chronic conditions.  She has a history of cancer and is currently under the care of an oncologist. She has been on chemotherapy for over a year, resulting in a weight loss of 50 pounds over the past year. Recently, she has gained some weight, from 169 to 174 pounds since July.  She experiences tingling and numbness in her feet, for which she takes gabapentin . She also reports a decreased appetite and states that if she does not eat, she feels sick. No issues with heartburn or acid reflux.  She has severe arthritis, particularly in both shoulders and her left knee, impacting her ability to perform certain activities like combing her hair. She attends physical therapy twice a week to help manage her arthritis and improve her balance. She uses a pedal under her table for exercise and relies on a cart for support when walking in grocery stores.  She reports a loss of taste and smell. She mentioned she has  been waiting to see an ear, nose, and throat specialist, but is unsure if she will attend as she has gone before and did not find it helpful.  No breathing difficulties, shortness of breath, or memory problems. She urinates frequently but does not experience incontinence. She has bowel movements twice a week and reports a recent fall due to loss of balance, though she did not sustain any injuries.  She lives with her husband of seventy years and is able to perform most daily activities independently, including driving and cooking.    Review of Systems:  Review of Systems  Constitutional:  Negative for chills and fever.  HENT:  Negative for congestion and sore throat.   Respiratory:  Negative for cough, sputum production and shortness of breath.   Cardiovascular:  Negative for chest pain, palpitations and leg swelling.  Gastrointestinal:  Negative for abdominal pain, heartburn and nausea.  Genitourinary:  Negative for dysuria, frequency and hematuria.  Musculoskeletal:  Negative for falls and myalgias.  Neurological:  Negative for dizziness, sensory change and focal weakness.   Negative unless indicated in HPI.   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   Diabetes mellitus    fasting 90-100   HTN (hypertension) 03/25/2013   Hx of radiation therapy    breasts hx  Hypercholesterolemia    Hypertension    Lymphedema of arm    left arm   Pulmonary embolism (HCC)    Type II or unspecified type diabetes mellitus without mention of complication, not stated as uncontrolled 03/25/2013   Past Surgical History:  Procedure Laterality Date   ABDOMINAL HYSTERECTOMY     ANKLE SURGERY Right 1995   APPENDECTOMY     BIOPSY  01/07/2018   Procedure: BIOPSY;  Surgeon: Saintclair Jasper, MD;  Location: [facility];  Service: Gastroenterology;;   BREAST SURGERY  Bilateral    mastectomy   COLONOSCOPY WITH PROPOFOL  N/A 01/07/2018   Procedure: COLONOSCOPY WITH PROPOFOL ;  Surgeon: Saintclair Jasper, MD;  Location: [facility];  Service: Gastroenterology;  Laterality: N/A;   ESOPHAGOGASTRODUODENOSCOPY (EGD) WITH PROPOFOL  N/A 01/06/2018   Procedure: ESOPHAGOGASTRODUODENOSCOPY (EGD) WITH PROPOFOL ;  Surgeon: Saintclair Jasper, MD;  Location: [facility];  Service: Gastroenterology;  Laterality: N/A;   HERNIA REPAIR  04-26-2010   IR IMAGING GUIDED PORT INSERTION  10/29/2021   MASS EXCISION Left 01/26/2013   Procedure: EXCISION LEFT CHEST WALL MASS AND LEFT ABDOMNAL WALL MASS;  Surgeon: Vicenta DELENA Poli, MD;  Location: [facility];  Service: General;  Laterality: Left;   MASS EXCISION Left 09/20/2014   Procedure: EXCISION OF LEFT CHEST WALL MASS;  Surgeon: Vicenta Poli, MD;  Location:  SURGERY CENTER;  Service: General;  Laterality: Left;   TEE WITHOUT CARDIOVERSION N/A 12/08/2017   Procedure: TRANSESOPHAGEAL ECHOCARDIOGRAM (TEE);  Surgeon: Pietro Redell RAMAN, MD;  Location: [facility];  Service: Cardiovascular;  Laterality: N/A;   TOTAL HIP ARTHROPLASTY Right 01/05/2017   TOTAL HIP ARTHROPLASTY Right 01/05/2017   Procedure: TOTAL HIP ARTHROPLASTY ANTERIOR APPROACH;  Surgeon: Liam Lerner, MD;  Location: [facility];  Service: Orthopedics;  Laterality: Right;   Social History:   reports that she quit smoking about 51 years ago. Her smoking use included cigarettes. She has never used smokeless tobacco. She reports that she does not currently use alcohol. She reports that she does not currently use drugs.  Family History  Problem Relation Age of Onset   Breast cancer Cousin        maternal first cousin   Uterine cancer Cousin        maternal first cousin    Medications: Patient's Medications  New Prescriptions   No medications on file  Previous Medications   ACETAMINOPHEN  (TYLENOL ) 500 MG TABLET    Take 1,000 mg by mouth as needed for moderate pain.   AMLODIPINE   (NORVASC ) 10 MG TABLET    TAKE 1 TABLET BY MOUTH DAILY   DICLOFENAC  SODIUM (VOLTAREN ) 1 % GEL    Apply 2 g topically 4 (four) times daily.   FERROUS SULFATE  325 (65 FE) MG EC TABLET    Take 1 tablet by mouth every morning.   GABAPENTIN  (NEURONTIN ) 100 MG CAPSULE    TAKE 1 CAPSULE BY MOUTH AT  BEDTIME   MULTIPLE VITAMIN (MULTIVITAMIN) TABLET    Take 1 tablet by mouth daily.   NERATINIB  MALEATE (NERLYNX ) 40 MG TABLET    Take 3 tablets (120 mg total) by mouth daily. Take with food.   TAMOXIFEN  (NOLVADEX ) 20 MG TABLET    Take 1 tablet (20 mg total) by mouth daily.   TURMERIC (QC TUMERIC COMPLEX PO)    Take 1 tablet by mouth daily.   VITAMIN D PO    Take 1 tablet by mouth daily.  Modified Medications   No medications on file  Discontinued Medications   No  medications on file    Physical Exam: Vitals:   01/26/24 1012  BP: 128/76  Pulse: 71  Temp: 98.1 F (36.7 C)  SpO2: 98%  Weight: 174 lb 12.8 oz (79.3 kg)  Height: 5' 7.32 (1.71 m)   Body mass index is 27.12 kg/m. BP Readings from Last 3 Encounters:  01/26/24 128/76  01/06/24 (!) 173/73  11/10/23 (!) 144/54   Wt Readings from Last 3 Encounters:  01/26/24 174 lb 12.8 oz (79.3 kg)  01/06/24 169 lb (76.7 kg)  11/10/23 169 lb 6.4 oz (76.8 kg)    Physical Exam Constitutional:      Appearance: Normal appearance.  HENT:     Head: Normocephalic and atraumatic.  Cardiovascular:     Rate and Rhythm: Normal rate and regular rhythm.  Pulmonary:     Effort: Pulmonary effort is normal. No respiratory distress.     Breath sounds: Normal breath sounds. No wheezing.  Abdominal:     General: Bowel sounds are normal. There is no distension.     Tenderness: There is no abdominal tenderness. There is no guarding or rebound.     Comments:    Musculoskeletal:     Comments: Bil shoulder - abduction limited to 60 deg Knee- no redness or swelling   Neurological:     Mental Status: She is alert. Mental status is at baseline.     Motor: No  weakness.     Labs reviewed: Basic Metabolic Panel: Recent Labs    08/24/23 1409 10/12/23 0904 01/06/24 1326  NA 139 139 142  K 4.0 3.8 4.0  CL 105 107 107  CO2 28 26 29   GLUCOSE 111* 134* 109*  BUN 15 14 16   CREATININE 0.59 0.66 0.62  CALCIUM 8.9 8.7* 8.3*  MG 1.9 1.7 1.9   Liver Function Tests: Recent Labs    08/24/23 1409 10/12/23 0904 01/06/24 1326  AST 19 18 15   ALT 11 10 9   ALKPHOS 70 71 69  BILITOT 0.4 0.6 0.5  PROT 7.1 7.0 6.6  ALBUMIN 4.0 3.9 3.8   No results for input(s): LIPASE, AMYLASE in the last 8760 hours. No results for input(s): AMMONIA in the last 8760 hours. CBC: Recent Labs    08/24/23 1409 10/12/23 0904 01/06/24 1326  WBC 5.4 5.3 6.3  NEUTROABS 3.0 2.9 3.9  HGB 12.0 11.9* 11.1*  HCT 36.8 36.7 34.6*  MCV 78.8* 78.4* 80.7  PLT 175 179 186   Lipid Panel: No results for input(s): CHOL, HDL, LDLCALC, TRIG, CHOLHDL, LDLDIRECT in the last 8760 hours. TSH: No results for input(s): TSH in the last 8760 hours. A1C: Lab Results  Component Value Date   HGBA1C 5.8 (H) 01/06/2024    Assessment and Plan Assessment & Plan  1. Essential hypertension (Primary)  At goal  Cont with amlodipine   2. Malignant neoplasm of lower-inner quadrant of left breast in female, estrogen receptor positive (HCC)  On tamoxifen , nerlynx  Follow up with oncology  3. Generalized OA  Take tylenol  650 mg q6 prn Cont with physical therapy  4. Mild anemia  No signs of bleeding    Latest Ref Rng & Units 01/06/2024    1:26 PM 10/12/2023    9:04 AM 08/24/2023    2:09 PM  CBC  WBC 4.0 - 10.5 K/uL 6.3  5.3  5.4   Hemoglobin 12.0 - 15.0 g/dL 88.8  88.0  87.9   Hematocrit 36.0 - 46.0 % 34.6  36.7  36.8   Platelets 150 - 400  K/uL 186  179  175      5. Neuropathy  Stable  Cont with gabapentin 

## 2024-01-26 NOTE — Telephone Encounter (Signed)
 Left voicemail for patient regarding rescheduled appointment from 02/05/2024 to 02/11/2024.

## 2024-01-27 ENCOUNTER — Ambulatory Visit

## 2024-01-27 DIAGNOSIS — M25562 Pain in left knee: Secondary | ICD-10-CM

## 2024-01-27 DIAGNOSIS — R262 Difficulty in walking, not elsewhere classified: Secondary | ICD-10-CM

## 2024-01-27 DIAGNOSIS — M6281 Muscle weakness (generalized): Secondary | ICD-10-CM

## 2024-01-27 DIAGNOSIS — R293 Abnormal posture: Secondary | ICD-10-CM

## 2024-01-27 DIAGNOSIS — M19011 Primary osteoarthritis, right shoulder: Secondary | ICD-10-CM

## 2024-01-27 NOTE — Therapy (Signed)
 OUTPATIENT PHYSICAL THERAPY LOWER EXTREMITY TREATMENT       Patient Name: Tami Schmidt MRN: [id number] DOB:Feb 17, 1933, 89 y.o., female Today's Date: 01/27/2024  END OF SESSION:  PT End of Session - 01/27/24 0934     Visit Number 16    Authorization Type [employer]    Authorization Time Period 8/19 to 10/14- 16 visits    Authorization - Visit Number 16    Authorization - Number of Visits 16    PT Start Time 0931    PT Stop Time 1012    PT Time Calculation (min) 41 min    Activity Tolerance Patient tolerated treatment well    Behavior During Therapy WFL for tasks assessed/performed                     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   Diabetes mellitus    fasting 90-100   HTN (hypertension) 03/25/2013   Hx of radiation therapy    breasts hx   Hypercholesterolemia    Hypertension    Lymphedema of arm    left arm   Pulmonary embolism (HCC)    Type II or unspecified type diabetes mellitus without mention of complication, not stated as uncontrolled 03/25/2013   Past Surgical History:  Procedure Laterality Date   ABDOMINAL HYSTERECTOMY     ANKLE SURGERY Right 1995   APPENDECTOMY     BIOPSY  01/07/2018   Procedure: BIOPSY;  Surgeon: Saintclair Jasper, MD;  Location: [facility];  Service: Gastroenterology;;   BREAST SURGERY Bilateral    mastectomy   COLONOSCOPY WITH PROPOFOL  N/A 01/07/2018   Procedure: COLONOSCOPY WITH PROPOFOL ;  Surgeon: Saintclair Jasper, MD;  Location: [facility];  Service: Gastroenterology;  Laterality: N/A;   ESOPHAGOGASTRODUODENOSCOPY (EGD) WITH PROPOFOL  N/A 01/06/2018   Procedure: ESOPHAGOGASTRODUODENOSCOPY (EGD) WITH PROPOFOL ;  Surgeon: Saintclair Jasper, MD;  Location: [facility];  Service: Gastroenterology;  Laterality: N/A;   HERNIA REPAIR  04-26-2010   IR IMAGING GUIDED PORT INSERTION  10/29/2021    MASS EXCISION Left 01/26/2013   Procedure: EXCISION LEFT CHEST WALL MASS AND LEFT ABDOMNAL WALL MASS;  Surgeon: Vicenta DELENA Poli, MD;  Location: [facility];  Service: General;  Laterality: Left;   MASS EXCISION Left 09/20/2014   Procedure: EXCISION OF LEFT CHEST WALL MASS;  Surgeon: Vicenta Poli, MD;  Location: [facility];  Service: General;  Laterality: Left;   TEE WITHOUT CARDIOVERSION N/A 12/08/2017   Procedure: TRANSESOPHAGEAL ECHOCARDIOGRAM (TEE);  Surgeon: Pietro Redell RAMAN, MD;  Location: [facility];  Service: Cardiovascular;  Laterality: N/A;   TOTAL HIP ARTHROPLASTY Right 01/05/2017   TOTAL HIP ARTHROPLASTY Right 01/05/2017   Procedure: TOTAL HIP ARTHROPLASTY ANTERIOR APPROACH;  Surgeon: Liam Lerner, MD;  Location: [facility];  Service: Orthopedics;  Laterality: Right;   Patient Active Problem List   Diagnosis Date Noted   Acute lower GI bleeding 01/07/2023   Rectal bleeding 01/05/2023   Acute GI bleeding 10/04/2022   Port-A-Cath in place 06/12/2022   Loss of perception for taste 03/17/2022   Primary osteoarthritis involving multiple joints 03/17/2022   Genetic testing 07/24/2021   Microcytosis 04/29/2021   Hypokalemia 04/28/2021   Pulmonary embolism (HCC)    Agnosia 08/09/2020   Hardening of the aorta (main artery of the heart) 08/09/2020   Morbid obesity (HCC)  08/09/2020   Neuropathy 08/09/2020   Primary osteoarthritis 08/09/2020   Sensorineural hearing loss (SNHL) of both ears 08/07/2020   Anosmia 06/05/2020   Tinnitus, bilateral 06/05/2020   GI bleed 07/13/2019   ABLA (acute blood loss anemia) 07/12/2019   Microcytic anemia 01/05/2018   Melena 01/04/2018   Lower GI bleed 01/04/2018   Diabetes mellitus type II, controlled, with no complications (HCC) 12/05/2017   Tenosynovitis of left wrist 12/02/2017   Infection of left wrist (HCC) 11/30/2017   Primary osteoarthritis of right hip 01/05/2017   Osteoarthritis of right hip 01/03/2017   Lymphedema of upper  extremity 01/17/2014   Osteopenia 01/17/2014   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   Dilated pore of [place] 08/02/2013   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 08/02/2013   Scar 08/02/2013   Malignant neoplasm of lower-inner quadrant of left breast in female, estrogen receptor positive (HCC) 06/16/2013   Type II or unspecified type diabetes mellitus without mention of complication, not stated as uncontrolled 03/25/2013   Essential hypertension 03/25/2013   Chest wall recurrence of breast cancer (HCC) 02/21/2013   Abdominal wall mass 01/14/2013    PCP: Following with cancer center   REFERRING PROVIDER: Joane Birmingham, MD  REFERRING DIAG: Chronic pain of both shoulders, Chronic pain of left knee, Balance problem   THERAPY DIAG:  Left knee pain, unspecified chronicity  Primary osteoarthritis of shoulders, bilateral  Difficulty in walking, not elsewhere classified  Muscle weakness (generalized)  Abnormal posture  Rationale for Evaluation and Treatment: Rehabilitation  ONSET DATE: 3-4 years ago  SUBJECTIVE:   SUBJECTIVE STATEMENT: Lt arm is better today.  My left leg didn't feel too bad today.  I'm ready to D/C and I'll do my exercises.   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 Pts husband has wrapped before and she has a night garment  PAIN:   01/11/24   0/10 pain in Lt knee Are you having pain? Yes: Pain location: bil shoulders Pain description: ache, didn't rate the pain Aggravating factors: lifting arms to side or forward  Relieving factors: not moving it  PRECAUTIONS: None  RED FLAGS: None   WEIGHT BEARING RESTRICTIONS: No  FALLS:  Has patient fallen in last 6 months? Yes. Number of falls 1- PT will address falls in PT treatment   LIVING ENVIRONMENT: Lives with: lives with their family and lives with their spouse Lives in: House/apartment Stairs: Yes: Internal: 16 steps; on right going up Has following equipment at home: Single point cane  OCCUPATION: Retired, husband is the Financial trader, read   PLOF: Independent  PATIENT GOALS: Walk up the steps with both feet, strengthen both knees  NEXT MD VISIT: Sees Oncologist in 1 month  OBJECTIVE:  Note: Objective measures were completed at Evaluation unless otherwise noted.  DIAGNOSTIC FINDINGS:   PATIENT SURVEYS:  LEFS  Extreme difficulty/unable (0), Quite a bit  of difficulty (1), Moderate difficulty (2), Little difficulty (3), No difficulty (4) Survey date:  12/01/23 01/11/24  Any of your usual work, housework or school activities 3 4  2. Usual hobbies, recreational or sporting activities 3 4  3. Getting into/out of the bath 3 3  4. Walking between rooms 3 4  5. Putting on socks/shoes 3 4  6. Squatting  0 1  7. Lifting an object, like a bag of groceries from the floor 2 3  8. Performing light activities around your home 3 4  9. Performing heavy activities around your home 1 1  10. Getting into/out of a car 4 4  11. Walking 2 blocks 3 3  12. Walking 1 mile 0 0  13. Going up/down 10 stairs (1 flight) 3 1  14. Standing for 1 hour 2 2  15.  sitting for 1 hour 4 4  16. Running on even ground 0 0  17. Running on uneven ground 0 0  18. Making sharp turns while running fast 0 0   19. Hopping  0 0  20. Rolling over in bed 3 4   Score total:  40/80 46/80     COGNITION: Overall cognitive status: Within functional limits for tasks assessed     SENSATION: Not tested Neuropathy in bilateral feet at night , why pt takes gabapentin     MUSCLE LENGTH: Hamstrings: Right side tightness; Left side tightness   POSTURE: rounded shoulders, forward head, and flexed trunk   PALPATION: NA  UPPER EXTREMITY ROM: Shoulder strength: 4/5 shoulder flexion and extension, abduction 4-/5, IR/ER 3+/5 tested in neutral Shoulder mobility: limited 60% in bilateral shoul flexion/Abd/ER/IR with pain and weakness reported   LOWER EXTREMITY ROM:  Active ROM Right eval Left eval  Hip flexion WNL WNL  Hip extension Dec ROM Dec Rom  Hip abduction Dec ROM Dec ROM  Hip adduction    Hip internal rotation Dec ROM Dec ROM  Hip external rotation Dec ROM Dec ROM  Knee flexion Dec ROM Dec ROM  Knee extension Dec ROM Dec ROM  Ankle dorsiflexion    Ankle plantarflexion    Ankle inversion    Ankle eversion     (Blank rows = not tested)  LOWER EXTREMITY MMT:  MMT Right eval Left eval Left  01/27/24  Hip flexion 4- 3+ 4-  Hip extension 4- 3+ 4-  Hip abduction 4- 3+ 4-  Hip adduction     Hip internal rotation 4- 4- 4  Hip external rotation 4- 4- 4  Knee flexion 4 3+ 4+  Knee extension 4- 3+ 4+  Ankle dorsiflexion     Ankle plantarflexion     Ankle inversion     Ankle eversion      (Blank rows = not tested)  LOWER EXTREMITY SPECIAL TESTS:    FUNCTIONAL TESTS:  8/19/255 times sit to stand: 15.27 sec used UE support  Timed up and go (TUG): 19.27 no cane; 19.00 sec with cane  3 minute walk test: not complete  12/17/23:  5x sit to stand: 19.73 seconds  TUG: 19.84 seconds   12/21/23:  3 min walk: 310 feet with 3/10 RPE  01/11/24:  5x sit to stand: 18.32 seconds  TUG: 16.83 seconds with cane    01/27/24:  5x sit to stand: not tested due to shoulder pain TUG: 17  seconds with cane  GAIT: Distance walked: 300 ft  Assistive device utilized: Single point cane Level of assistance: Min A, genu varus and  antalgic gait. Comments: Pt walked from wait room across ortho gym to private room and back                                                                                                                                TREATMENT DATE:   01/27/24: Nustep - 6 mins L5 - legs and Rt UE only-PT present to discuss progress Standing heel raises 2 x 10 Standing hip extension 2 x 10 each LE Seated LAQ 5# 2x10 Seated march 5# x 20 B (1 is 1) Seated ER with 5# seated 2x10  Sit stand from chair + foam x 10 Standing hip abduction 2 x 10 with 5# Seated clam with green theraband loop x 20 Seated resisted hamstring curls with blue band (PT anchoring) 2 x 10  01/21/24: Nustep - 6 mins L5 - legs and Rt UE only-PT present to discuss progress Standing heel raises 2 x 10 Standing hip extension 2 x 10 each LE Seated LAQ 5# 2x10 Seated march 5# x 20 B (1 is 1) Seated ER with 5# seated 2x10  Sit stand from chair + foam x 10 Standing hip abduction 2 x 10 with 5# Seated clam with green theraband loop x 20 Seated resisted hamstring curls with blue band (PT anchoring) 2 x 10   01/18/24: Nustep - 6 mins L5 - LEGS ONLY PT present to discuss progress  Standing heel raises 2 x 10 Standing hip extension 2 x 10 each LE Seated LAQ 5# 2x10 Seated march 5# x 20 B (1 is 1) Seated ER with 5# seated 2x10  Sit stand from chair + foam x 10 Standing hip abduction 2 x 10 with 5# Seated clam with red + black loops x 20 Seated resisted hamstring curls with blue band (PT anchoring) 2 x 10 Step ups 4 inch 1x10 bil each, Step ups 6 inch x 10 B  PATIENT EDUCATION:  Education details: [email or web address] Person educated: Patient Education method: Explanation, Demonstration, Tactile cues, Verbal cues, and Handouts Education comprehension: verbalized understanding, returned demonstration, and  verbal cues required  HOME EXERCISE PROGRAM: Access Code: [email or web address] URL: [email or web address] Date: 12/07/2023 Prepared by: Burnard  Exercises - Seated Hip Abduction with Resistance  - 2-3 x daily - 7 x weekly - 1-2 sets - 10 reps - Seated Quad Set  - 2-3 x daily - 7 x weekly - 1-2 sets - 5 reps - 5 hold - Seated Heel Toe Raises  - 3 x daily - 7 x weekly - 2 sets - 10 reps - Sit to Stand with Armchair  - 2 x daily - 7 x weekly - 2 sets - 5 reps - Seated Long Arc Quad  - 2 x daily - 7 x weekly - 1 sets - 10 reps - Standing March with Counter Support  - 2 x daily - 7 x weekly - 10 reps - Seated Hip Adduction Isometrics with Mercer  -  2 x daily - 7 x weekly - 1 sets - 10 reps - 5 hold   ASSESSMENT:  CLINICAL IMPRESSION: PT is ready to D/C to HEP.  LE strength is improved overall and she is consistent with  her HEP and will continue with this after D/C.  PT monitored throughout session for safety and to provide cueing.    OBJECTIVE IMPAIRMENTS: Abnormal gait, cardiopulmonary status limiting activity, decreased activity tolerance, decreased balance, decreased coordination, decreased endurance, decreased knowledge of condition, decreased mobility, difficulty walking, decreased ROM, decreased strength, impaired sensation, impaired UE functional use, improper body mechanics, postural dysfunction, and pain.   ACTIVITY LIMITATIONS: carrying, lifting, bending, standing, squatting, stairs, transfers, toileting, reach over head, and caring for others  PARTICIPATION LIMITATIONS: meal prep, cleaning, laundry, and yard work  PERSONAL FACTORS: Age, Fitness, Social background, Time since onset of injury/illness/exacerbation, and 3+ comorbidities:    REHAB POTENTIAL: Good  CLINICAL DECISION MAKING: Evolving/moderate complexity  EVALUATION COMPLEXITY: Moderate   GOALS: Goals reviewed with patient? Yes  SHORT TERM GOALS: Target date: 12/25/23 Pt will be independent with initial HEP so  she can maintain PT gains and increase functional capacity.  Baseline: 01/11/24 Goal status: MET  2.  Pt will complete  3 min walk test to document her baseline.  Baseline: 310 feet (12/21/23) Goal status: MET     LONG TERM GOALS: Target date: 01/22/24  Pt will be independent with advanced HEP so she can maintain PT gains and increase functional capacity.  Baseline:  Goal status: MET  2.  Pt will perform 5 Sit to stands in < or = to 11 seconds or less without UE support so she is able to perform transfers independently.  Baseline: 18.32 (01/11/24) Goal status: not met   3.  Pt will score 4+/5 in all LE motions so she increases hip stability for improved functional capacity for navigating stairs.  Baseline: see above  Goal status: partially met  4.  Pt will complete Single limb stance for > or = to 10+ seconds bilaterally so she demonstrates improved balance for car transfers, and long standing.  Baseline: 11 seconds  Goal status: MET  5.  Pt will complete TUG in less than or equal to 14 sec so she improves balance and coordination so she can go down stairs safely.  Baseline:16.83 seconds  with cane (01/11/24) Goal status: In progress     PLAN: PHYSICAL THERAPY DISCHARGE SUMMARY  Visits from Start of Care: 16  Current functional level related to goals / functional outcomes: See above for current status.    Remaining deficits: Functional strength deficits associated with chronic condition.  Mobility overall has improved and she will continue with HEP   Education / Equipment: HEP   Patient agrees to discharge. Patient goals were partially met. Patient is being discharged due to being pleased with the current functional level.   Burnard Joy, PT 01/27/24 10:14 AM  [facility] [address], [address] [place], [place] [address] Phone # [phone] Fax [phone]

## 2024-02-05 ENCOUNTER — Other Ambulatory Visit

## 2024-02-05 ENCOUNTER — Ambulatory Visit: Admitting: Hematology and Oncology

## 2024-02-10 ENCOUNTER — Encounter: Payer: Self-pay | Admitting: Podiatry

## 2024-02-10 ENCOUNTER — Ambulatory Visit: Admitting: Podiatry

## 2024-02-10 ENCOUNTER — Telehealth: Payer: Self-pay

## 2024-02-10 DIAGNOSIS — I739 Peripheral vascular disease, unspecified: Secondary | ICD-10-CM

## 2024-02-10 DIAGNOSIS — M79675 Pain in left toe(s): Secondary | ICD-10-CM | POA: Diagnosis not present

## 2024-02-10 DIAGNOSIS — B351 Tinea unguium: Secondary | ICD-10-CM | POA: Diagnosis not present

## 2024-02-10 DIAGNOSIS — M79674 Pain in right toe(s): Secondary | ICD-10-CM

## 2024-02-10 DIAGNOSIS — E1142 Type 2 diabetes mellitus with diabetic polyneuropathy: Secondary | ICD-10-CM | POA: Diagnosis not present

## 2024-02-10 DIAGNOSIS — L84 Corns and callosities: Secondary | ICD-10-CM

## 2024-02-10 NOTE — Telephone Encounter (Signed)
 Left message on voicemail about upcoming appointment 10/30

## 2024-02-11 ENCOUNTER — Inpatient Hospital Stay (HOSPITAL_BASED_OUTPATIENT_CLINIC_OR_DEPARTMENT_OTHER)

## 2024-02-11 ENCOUNTER — Inpatient Hospital Stay: Attending: Adult Health | Admitting: Hematology and Oncology

## 2024-02-11 ENCOUNTER — Inpatient Hospital Stay

## 2024-02-11 VITALS — BP 145/78 | HR 80 | Temp 97.5°F | Resp 17 | Wt 174.0 lb

## 2024-02-11 DIAGNOSIS — C778 Secondary and unspecified malignant neoplasm of lymph nodes of multiple regions: Secondary | ICD-10-CM | POA: Diagnosis not present

## 2024-02-11 DIAGNOSIS — M1990 Unspecified osteoarthritis, unspecified site: Secondary | ICD-10-CM | POA: Insufficient documentation

## 2024-02-11 DIAGNOSIS — Z170 Estrogen receptor positive status [ER+]: Secondary | ICD-10-CM

## 2024-02-11 DIAGNOSIS — R3911 Hesitancy of micturition: Secondary | ICD-10-CM | POA: Insufficient documentation

## 2024-02-11 DIAGNOSIS — C50312 Malignant neoplasm of lower-inner quadrant of left female breast: Secondary | ICD-10-CM | POA: Diagnosis not present

## 2024-02-11 DIAGNOSIS — G620 Drug-induced polyneuropathy: Secondary | ICD-10-CM | POA: Diagnosis not present

## 2024-02-11 DIAGNOSIS — R432 Parageusia: Secondary | ICD-10-CM | POA: Insufficient documentation

## 2024-02-11 DIAGNOSIS — R351 Nocturia: Secondary | ICD-10-CM | POA: Diagnosis not present

## 2024-02-11 DIAGNOSIS — Z79810 Long term (current) use of selective estrogen receptor modulators (SERMs): Secondary | ICD-10-CM | POA: Insufficient documentation

## 2024-02-11 LAB — CBC WITH DIFFERENTIAL/PLATELET
Abs Immature Granulocytes: 0.01 K/uL (ref 0.00–0.07)
Basophils Absolute: 0 K/uL (ref 0.0–0.1)
Basophils Relative: 1 %
Eosinophils Absolute: 0.2 K/uL (ref 0.0–0.5)
Eosinophils Relative: 4 %
HCT: 35.7 % — ABNORMAL LOW (ref 36.0–46.0)
Hemoglobin: 11.6 g/dL — ABNORMAL LOW (ref 12.0–15.0)
Immature Granulocytes: 0 %
Lymphocytes Relative: 25 %
Lymphs Abs: 1.3 K/uL (ref 0.7–4.0)
MCH: 26 pg (ref 26.0–34.0)
MCHC: 32.5 g/dL (ref 30.0–36.0)
MCV: 79.9 fL — ABNORMAL LOW (ref 80.0–100.0)
Monocytes Absolute: 0.4 K/uL (ref 0.1–1.0)
Monocytes Relative: 7 %
Neutro Abs: 3.4 K/uL (ref 1.7–7.7)
Neutrophils Relative %: 63 %
Platelets: 202 K/uL (ref 150–400)
RBC: 4.47 MIL/uL (ref 3.87–5.11)
RDW: 15.4 % (ref 11.5–15.5)
WBC: 5.3 K/uL (ref 4.0–10.5)
nRBC: 0 % (ref 0.0–0.2)

## 2024-02-11 LAB — CMP (CANCER CENTER ONLY)
ALT: 7 U/L (ref 0–44)
AST: 12 U/L — ABNORMAL LOW (ref 15–41)
Albumin: 3.8 g/dL (ref 3.5–5.0)
Alkaline Phosphatase: 66 U/L (ref 38–126)
Anion gap: 7 (ref 5–15)
BUN: 16 mg/dL (ref 8–23)
CO2: 27 mmol/L (ref 22–32)
Calcium: 8.6 mg/dL — ABNORMAL LOW (ref 8.9–10.3)
Chloride: 105 mmol/L (ref 98–111)
Creatinine: 0.65 mg/dL (ref 0.44–1.00)
GFR, Estimated: >60 mL/min (ref >60)
Glucose, Bld: 181 mg/dL — ABNORMAL HIGH (ref 70–99)
Potassium: 3.7 mmol/L (ref 3.5–5.1)
Sodium: 139 mmol/L (ref 135–145)
Total Bilirubin: 0.5 mg/dL (ref 0.0–1.2)
Total Protein: 6.7 g/dL (ref 6.5–8.1)

## 2024-02-11 LAB — MAGNESIUM: Magnesium: 1.7 mg/dL (ref 1.7–2.4)

## 2024-02-11 MED ORDER — NERATINIB MALEATE 40 MG PO TABS: 120.0000 mg | ORAL_TABLET | Freq: Every day | ORAL | 0 refills | Status: DC

## 2024-02-11 NOTE — Progress Notes (Signed)
 [facility] Cancer Follow up:    Tami Madden, MD [address] [place] [address]   DIAGNOSIS:  Cancer Staging  Chest wall recurrence of breast cancer [facility]) Staging form: Breast, AJCC 7th Edition - Clinical: Stage Unknown (TX, N1, M0) - Signed by Layla Sandria BROCKS, MD on 07/27/2014  Malignant neoplasm of lower-inner quadrant of left breast in female, estrogen receptor positive (HCC) Staging form: Breast, AJCC 7th Edition - Clinical: Stage IA (T1b, N0, M0) - Signed by Layla Sandria BROCKS, MD on 07/27/2014   SUMMARY OF ONCOLOGIC HISTORY: 1993: Right lumpectomy axillary dissection in [place]  (stage I) treated with radiation and tamoxifen  x 5 years 2002: Right mastectomy: Right breast recurrence 2004: Left mastectomy: 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  CURRENT THERAPY: Neratinib /tamoxifen   INTERVAL HISTORY:  Tami Schmidt 89 y.o. female returns for follow-up while on neratinib  and tamoxifen .  Discussed the use of AI scribe software for clinical note transcription with the patient, who gave verbal consent to proceed.  History of Present Illness  Tami Schmidt is an 89 year old female who presents for a follow-up visit regarding her cancer treatment.  She uses a cane for mobility, which aids her movement, although she does not move quickly. She is currently taking Nerlynx  (neratinib ) and tamoxifen , with no reported side effects. She takes three pills of Nerlynx  daily and finds  them small and easy to swallow. No diarrhea has been experienced.  She is concerned about her taste, noting an inability to taste cinnamon, nutmeg, and other flavors, although she can taste sweet and hot foods. Despite this, she is eating well and has gained two pounds.  She experiences neuropathy, but it has not worsened with her current medication regimen. She also reports stiffness and arthritis, which she describes as 'kicking my butt'. There is no swelling in her legs, and she describes her legs as 'skinny'.  She reports frequent urination at night, describing it as 'pee o'clock', with a slower stream and weaker flow. Her bowel movements are regular, and she has no difficulty with them.  Socially, she has five children and enjoys baking, although her ability to taste has been affected.     Patient Active Problem List   Diagnosis Date Noted   Acute lower GI bleeding 01/07/2023   Rectal bleeding 01/05/2023   Acute GI bleeding 10/04/2022   Port-A-Cath in place 06/12/2022   Loss of perception for taste 03/17/2022   Primary osteoarthritis involving multiple joints 03/17/2022   Genetic testing 07/24/2021   Microcytosis 04/29/2021   Hypokalemia 04/28/2021   Pulmonary embolism (HCC)    Agnosia 08/09/2020   Hardening of the aorta (main artery of the heart) 08/09/2020   Morbid obesity (HCC) 08/09/2020   Neuropathy 08/09/2020   Primary osteoarthritis 08/09/2020   Sensorineural hearing loss (SNHL) of both ears 08/07/2020   Anosmia 06/05/2020   Tinnitus, bilateral 06/05/2020   GI bleed 07/13/2019   ABLA (acute blood loss anemia) 07/12/2019   Microcytic anemia 01/05/2018   Melena 01/04/2018   Lower GI bleed 01/04/2018   Diabetes mellitus type II,  controlled, with no complications (HCC) 12/05/2017   Tenosynovitis of left wrist 12/02/2017   Infection of left wrist (HCC) 11/30/2017   Primary osteoarthritis of right hip 01/05/2017   Osteoarthritis of right hip 01/03/2017   Lymphedema of  upper extremity 01/17/2014   Osteopenia 01/17/2014   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   Dilated pore of Winer 08/02/2013   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   Diabetes mellitus    fasting 90-100   HTN (hypertension) 03/25/2013   Hx of radiation therapy    breasts hx   Hypercholesterolemia    Hypertension    Lymphedema of arm    left arm   Pulmonary embolism (HCC)    Type II or unspecified type diabetes mellitus without mention of complication, not stated as uncontrolled 03/25/2013    SURGICAL HISTORY: Past Surgical History:  Procedure Laterality Date   ABDOMINAL HYSTERECTOMY     ANKLE SURGERY Right 1995   APPENDECTOMY     BIOPSY  01/07/2018   Procedure: BIOPSY;  Surgeon: Saintclair Jasper, MD;  Location: [facility];  Service: Gastroenterology;;   BREAST SURGERY Bilateral    mastectomy   COLONOSCOPY WITH PROPOFOL  N/A 01/07/2018   Procedure: COLONOSCOPY WITH PROPOFOL ;  Surgeon: Saintclair Jasper, MD;  Location: [facility];  Service: Gastroenterology;  Laterality: N/A;   ESOPHAGOGASTRODUODENOSCOPY (EGD) WITH PROPOFOL  N/A 01/06/2018   Procedure: ESOPHAGOGASTRODUODENOSCOPY (EGD) WITH PROPOFOL ;  Surgeon: Saintclair Jasper, MD;  Location: [facility];  Service: Gastroenterology;  Laterality:  N/A;   HERNIA REPAIR  04-26-2010   IR IMAGING GUIDED PORT INSERTION  10/29/2021   MASS EXCISION Left 01/26/2013   Procedure: EXCISION LEFT CHEST WALL MASS AND LEFT ABDOMNAL WALL MASS;  Surgeon: Vicenta DELENA Poli, MD;  Location: [facility] OR;  Service: General;  Laterality: Left;   MASS EXCISION Left 09/20/2014   Procedure: EXCISION OF LEFT CHEST WALL MASS;  Surgeon: Vicenta Poli, MD;  Location: [facility];  Service: General;  Laterality: Left;   TEE WITHOUT CARDIOVERSION N/A 12/08/2017   Procedure: TRANSESOPHAGEAL ECHOCARDIOGRAM (TEE);  Surgeon: Pietro Redell RAMAN, MD;  Location: [facility];  Service: Cardiovascular;  Laterality: N/A;   TOTAL HIP ARTHROPLASTY Right 01/05/2017   TOTAL HIP ARTHROPLASTY Right 01/05/2017   Procedure: TOTAL HIP ARTHROPLASTY ANTERIOR APPROACH;  Surgeon: Liam Lerner, MD;  Location: [facility] OR;  Service: Orthopedics;  Laterality: Right;    SOCIAL HISTORY: Social History   Socioeconomic History   Marital status: Married    Spouse name: Not on file   Number of children: Not on file   Years of education: Not on file   Highest education level: Not on file  Occupational History   Occupation: retired child psychotherapist  Tobacco Use   Smoking status: Former    Current packs/day: 0.00    Types: Cigarettes    Quit date:  04/14/1972    Years since quitting: 51.8   Smokeless tobacco: Never  Vaping Use   Vaping status: Never Used  Substance and Sexual Activity   Alcohol use: Not Currently    Comment: occasional   Drug use: Not Currently   Sexual activity: Yes    Birth control/protection: Surgical  Other Topics Concern   Not on file  Social History Narrative   Not on file   Social Drivers of Health   Financial Resource Strain: Not on file  Food Insecurity: No Food Insecurity (05/21/2023)   Hunger Vital Sign    Worried About Running Out of Food in the Last Year: Never true    Ran Out of Food in the Last Year: Never true  Transportation Needs: No  Transportation Needs (05/21/2023)   PRAPARE - Administrator, Civil Service (Medical): No    Lack of Transportation (Non-Medical): No  Physical Activity: Not on file  Stress: Not on file  Social Connections: Socially Integrated (05/21/2023)   Social Connection and Isolation Panel    Frequency of Communication with Friends and Family: More than three times a week    Frequency of Social Gatherings with Friends and Family: More than three times a week    Attends Religious Services: More than 4 times per year    Active Member of [employer] or Organizations: Yes    Attends Engineer, Structural: More than 4 times per year    Marital Status: Married  Catering Manager Violence: Not At Risk (05/21/2023)   Humiliation, Afraid, Rape, and Kick questionnaire    Fear of Current or Ex-Partner: No    Emotionally Abused: No    Physically Abused: No    Sexually Abused: No    FAMILY HISTORY: Family History  Problem Relation Age of Onset   Breast cancer Cousin        maternal first cousin   Uterine cancer Cousin        maternal first cousin     PHYSICAL EXAMINATION    BP (!) 145/78 (BP Location: Right Arm, Patient Position: Sitting)   Pulse 80   Temp (!) 97.5 F (36.4 C) (Temporal)   Resp 17   Wt 174 lb (78.9 kg)   SpO2 100%   BMI 26.99 kg/m     Physical Exam Constitutional:      General: She is not in acute distress.    Appearance: Normal appearance. She is not toxic-appearing.  HENT:     Head: Normocephalic and atraumatic.     Mouth/Throat:     Mouth: Mucous membranes are moist.     Pharynx: Oropharynx is clear. No oropharyngeal exudate or posterior oropharyngeal erythema.  Eyes:     General: No scleral icterus. Cardiovascular:     Rate and Rhythm: Normal rate and regular rhythm.     Pulses: Normal pulses.     Heart sounds: Normal heart sounds.  Pulmonary:     Effort: Pulmonary effort is normal.     Breath sounds: Normal breath sounds.  Abdominal:      General: Abdomen is flat. Bowel sounds are normal. There is no distension.     Palpations: Abdomen is soft.     Tenderness: There is no abdominal tenderness.  Musculoskeletal:        General: Swelling (left arm lymphedema) present.     Cervical back: Neck supple.  Lymphadenopathy:     Cervical: No cervical adenopathy.  Skin:    General: Skin is warm and  dry.     Findings: No rash.  Neurological:     General: No focal deficit present.     Mental Status: She is alert.  Psychiatric:        Mood and Affect: Mood normal.        Behavior: Behavior normal.     LABORATORY DATA:  CBC    Component Value Date/Time   WBC 5.3 02/11/2024 1029   RBC 4.47 02/11/2024 1029   HGB 11.6 (L) 02/11/2024 1029   HGB 11.1 (L) 01/06/2024 1326   HGB 10.5 (L) 01/20/2017 1057   HCT 35.7 (L) 02/11/2024 1029   HCT 25.8 (L) 01/05/2018 0513   HCT 32.8 (L) 01/20/2017 1057   PLT 202 02/11/2024 1029   PLT 186 01/06/2024 1326   PLT 368 01/20/2017 1057   MCV 79.9 (L) 02/11/2024 1029   MCV 77.8 (L) 01/20/2017 1057   MCH 26.0 02/11/2024 1029   MCHC 32.5 02/11/2024 1029   RDW 15.4 02/11/2024 1029   RDW 16.0 (H) 01/20/2017 1057   LYMPHSABS 1.3 02/11/2024 1029   LYMPHSABS 1.3 01/20/2017 1057   MONOABS 0.4 02/11/2024 1029   MONOABS 0.4 01/20/2017 1057   EOSABS 0.2 02/11/2024 1029   EOSABS 0.1 01/20/2017 1057   BASOSABS 0.0 02/11/2024 1029   BASOSABS 0.1 01/20/2017 1057    CMP     Component Value Date/Time   NA 142 01/06/2024 1326   NA 139 01/20/2017 1057   K 4.0 01/06/2024 1326   K 3.9 01/20/2017 1057   CL 107 01/06/2024 1326   CO2 29 01/06/2024 1326   CO2 25 01/20/2017 1057   GLUCOSE 109 (H) 01/06/2024 1326   GLUCOSE 135 01/20/2017 1057   BUN 16 01/06/2024 1326   BUN 15.6 01/20/2017 1057   CREATININE 0.62 01/06/2024 1326   CREATININE 0.7 01/20/2017 1057   CALCIUM 8.3 (L) 01/06/2024 1326   CALCIUM 9.3 01/20/2017 1057   PROT 6.6 01/06/2024 1326   PROT 7.1 01/20/2017 1057   ALBUMIN 3.8  01/06/2024 1326   ALBUMIN 3.4 (L) 01/20/2017 1057   AST 15 01/06/2024 1326   AST 10 01/20/2017 1057   ALT 9 01/06/2024 1326   ALT 14 01/20/2017 1057   ALKPHOS 69 01/06/2024 1326   ALKPHOS 69 01/20/2017 1057   BILITOT 0.5 01/06/2024 1326   BILITOT 0.43 01/20/2017 1057   GFRNONAA >60 01/06/2024 1326   GFRAA >60 07/14/2019 0538     ASSESSMENT and THERAPY PLAN:   Assessment and Plan Assessment & Plan Breast cancer, metastatic with non regional LN on tamoxifen  and neratinib  - Continue Nerlynx  and tamoxifen . - Recent imaging with stable disease. - No AE's reported. - Ok to continue treatment, repeat imaging in 3 months - follow up with me in 4 weeks with labs.  Chemotherapy-induced peripheral neuropathy Neuropathy stable with current treatment.  Dysgeusia (altered taste) Persistent dysgeusia, some taste function retained, weight stable.  Osteoarthritis Osteoarthritis causing stiffness and discomfort, mobility affected, uses cane.  Urinary hesitancy and nocturia Nocturia and urinary hesitancy likely age-related.   All questions were answered. The patient knows to call the clinic with any problems, questions or concerns. We can certainly see the patient much sooner if necessary.  Total encounter time:30 minutes*in face-to-face visit time, chart review, lab review, care coordination, order entry, and documentation of the encounter time.  *Total Encounter Time as defined by the Centers for Medicare and Medicaid Services includes, in addition to the face-to-face time of a patient visit (documented in the note above)  non-face-to-face time: obtaining and reviewing outside history, ordering and reviewing medications, tests or procedures, care coordination (communications with other health care professionals or caregivers) and documentation in the medical record.

## 2024-02-14 NOTE — Progress Notes (Signed)
  Subjective:  Patient ID: Tami Schmidt, female    DOB: 1932-07-19,  MRN: [id number]  Tami Schmidt presents to clinic today for at risk footcare. Patient has h/o diabetes, neuropathy and PAD and is seen for  and callus(es) of both feet and painful mycotic toenails that are difficult to trim. Painful toenails interfere with ambulation. Aggravating factors include wearing enclosed shoe gear. Pain is relieved with periodic professional debridement. Painful calluses are aggravated when weightbearing with and without shoegear. Pain is relieved with periodic professional debridement.  Chief Complaint  Patient presents with   Diabetes    [facility] Diet control diabetes. Toenail trim. LOV with PCP 01/26/24.   New problem(s): None.   PCP is Sherlynn Madden, MD.  Allergies  Allergen Reactions   Bactrim Swelling    SWELLING OF MOUTH/FACE.   Lisinopril Swelling    SWELLING OF MOUTH/FACE.   Vasotec Swelling    SWELLING OF MOUTH/FACE.   Sulfamethoxazole-Trimethoprim     Other reaction(s): Unknown    Review of Systems: Negative except as noted in the HPI.  Objective: There were no vitals filed for this visit. Tami Schmidt is a pleasant 89 y.o. female in NAD. AAO x 3.  Vascular Examination: CFT <3 seconds b/l. DP pulses faintly palpable b/l. PT pulses nonpalpable b/l. Digital hair absent. Skin temperature gradient warm to warm b/l. No pain with calf compression. No ischemia or gangrene. No cyanosis or clubbing noted b/l.    Neurological Examination: Sensation grossly intact b/l with 10 gram monofilament. Vibratory sensation intact b/l.   Dermatological Examination: Pedal skin warm and supple b/l. No open wounds b/l. No interdigital macerations. Toenails 1-5 b/l thick, discolored, elongated with subungual debris and pain on dorsal palpation.  Hyperkeratotic lesion(s)  sub 5th met base right foot.  No erythema, no edema, no drainage, no fluctuance.  Musculoskeletal Examination: Muscle  strength 5/5 to all lower extremity muscle groups bilaterally. HAV with bunion deformity noted b/l LE. Hammertoe deformity noted 2-5 b/l. Pes planus deformity noted bilateral LE.  Radiographs: None  Assessment/Plan: 1. Pain due to onychomycosis of toenails of both feet   2. Callus   3. PVD (peripheral vascular disease)   4. Diabetic peripheral neuropathy associated with type 2 diabetes mellitus [facility])   Consent given for treatment. Patient examined.All patient's and/or POA's questions/concerns addressed on today's visit. Toenails 1-5 b/l debrided in length and girth without incident. Callus(es) sub 5th met base left foot pared with sharp debridement without incident. Continue foot and shoe inspections daily. Monitor blood glucose per PCP/Endocrinologist's recommendations.Continue soft, supportive shoe gear daily. Report any pedal injuries to medical professional. Call office if there are any questions/concerns. -Patient/POA to call should there be question/concern in the interim.   Return in about 3 months (around 05/12/2024).  Delon LITTIE Merlin, DPM      [facility] LOCATION: [address] [place] [address]                   Office [phone]   [facility] LOCATION: [address] [place], [place] [address] Office [phone]

## 2024-02-19 ENCOUNTER — Telehealth: Payer: Self-pay

## 2024-02-19 NOTE — Telephone Encounter (Signed)
 S/w patient regarding concerns with Nerlynx  shipment. Patient reports that she has been waiting for a call from [employer] to schedule shipping and has not received one.  RN reached out to [employer] directly who provided RN with direct number for patient to call for shipping.  RN provided patient with phone number for her to call to arrange shipment [phone]). Patient confirmed the phone number and will attempt to contact them today.

## 2024-02-23 ENCOUNTER — Encounter (INDEPENDENT_AMBULATORY_CARE_PROVIDER_SITE_OTHER): Payer: Self-pay | Admitting: Otolaryngology

## 2024-02-23 ENCOUNTER — Ambulatory Visit (INDEPENDENT_AMBULATORY_CARE_PROVIDER_SITE_OTHER): Admitting: Otolaryngology

## 2024-02-23 VITALS — BP 162/68 | HR 80 | Temp 97.6°F | Ht 67.0 in | Wt 170.0 lb

## 2024-02-23 DIAGNOSIS — R432 Parageusia: Secondary | ICD-10-CM | POA: Diagnosis not present

## 2024-02-23 DIAGNOSIS — R430 Anosmia: Secondary | ICD-10-CM

## 2024-02-24 NOTE — Progress Notes (Signed)
 CC: Loss of sense of smell and taste  Discussed the use of AI scribe software for clinical note transcription with the patient, who gave verbal consent to proceed.  History of Present Illness Tami Schmidt is an 89 year old female with recurrent breast cancer who presents with loss of smell and taste.  She has experienced a gradual loss of smell and taste for approximately a year and a half, which she attributes to chemotherapy treatment for recurrent breast cancer. She confirms a complete loss of smell, unable to detect strong perfumes. There is no history of allergies, sinus infections, or surgeries in the ear, nose, or throat area. She has no issues with nasal breathing and has not experienced COVID-19.  Regarding taste, she can detect minimal hot and sweet sensations on the back of her tongue but cannot taste spices. She has not noticed any changes in her condition over time, stating that 'nothing has changed' since the onset of her symptoms.   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   Diabetes mellitus    fasting 90-100   HTN (hypertension) 03/25/2013   Hx of radiation therapy    breasts hx   Hypercholesterolemia    Hypertension    Lymphedema of arm    left arm   Pulmonary embolism (HCC)    Type II or unspecified type diabetes mellitus without mention of complication, not stated as uncontrolled 03/25/2013    Past Surgical History:  Procedure Laterality Date   ABDOMINAL HYSTERECTOMY     ANKLE SURGERY Right 1995   APPENDECTOMY     BIOPSY  01/07/2018   Procedure: BIOPSY;  Surgeon: Saintclair Jasper, MD;  Location: [facility];  Service: Gastroenterology;;   BREAST SURGERY Bilateral    mastectomy   COLONOSCOPY WITH PROPOFOL  N/A 01/07/2018   Procedure: COLONOSCOPY WITH PROPOFOL ;  Surgeon: Saintclair Jasper, MD;  Location: [facility];  Service:  Gastroenterology;  Laterality: N/A;   ESOPHAGOGASTRODUODENOSCOPY (EGD) WITH PROPOFOL  N/A 01/06/2018   Procedure: ESOPHAGOGASTRODUODENOSCOPY (EGD) WITH PROPOFOL ;  Surgeon: Saintclair Jasper, MD;  Location: [facility];  Service: Gastroenterology;  Laterality: N/A;   HERNIA REPAIR  04-26-2010   IR IMAGING GUIDED PORT INSERTION  10/29/2021   MASS EXCISION Left 01/26/2013   Procedure: EXCISION LEFT CHEST WALL MASS AND LEFT ABDOMNAL WALL MASS;  Surgeon: Vicenta DELENA Poli, MD;  Location: [facility];  Service: General;  Laterality: Left;   MASS EXCISION Left 09/20/2014   Procedure: EXCISION OF LEFT CHEST WALL MASS;  Surgeon: Vicenta Poli, MD;  Location: [facility];  Service: General;  Laterality: Left;   TEE WITHOUT CARDIOVERSION N/A 12/08/2017   Procedure: TRANSESOPHAGEAL ECHOCARDIOGRAM (TEE);  Surgeon: Pietro Redell RAMAN, MD;  Location: [facility];  Service: Cardiovascular;  Laterality: N/A;   TOTAL HIP ARTHROPLASTY Right 01/05/2017   TOTAL HIP ARTHROPLASTY Right 01/05/2017   Procedure: TOTAL HIP ARTHROPLASTY ANTERIOR APPROACH;  Surgeon: Liam Lerner, MD;  Location: [facility];  Service: Orthopedics;  Laterality: Right;    Family History  Problem Relation Age of Onset   Breast cancer Cousin        maternal first cousin   Uterine cancer Cousin        maternal first cousin    Social History:  reports that she quit smoking about 51 years ago. Her smoking use included cigarettes. She  has never used smokeless tobacco. She reports that she does not currently use alcohol. She reports that she does not currently use drugs.  Allergies:  Allergies  Allergen Reactions   Bactrim Swelling    SWELLING OF MOUTH/FACE.   Lisinopril Swelling    SWELLING OF MOUTH/FACE.   Vasotec Swelling    SWELLING OF MOUTH/FACE.   Sulfamethoxazole-Trimethoprim     Other reaction(s): Unknown    Prior to Admission medications   Medication Sig Start Date End Date Taking? Authorizing Provider  acetaminophen  (TYLENOL )  500 MG tablet Take 1,000 mg by mouth as needed for moderate pain.   Yes [provider]  amLODipine  (NORVASC ) 10 MG tablet TAKE 1 TABLET BY MOUTH DAILY 12/21/23  Yes Iruku, Praveena, MD  diclofenac  Sodium (VOLTAREN ) 1 % GEL Apply 2 g topically 4 (four) times daily. Patient taking differently: Apply 2 g topically as needed. 08/24/23  Yes Causey, Morna Pickle, NP  ferrous sulfate  325 (65 FE) MG EC tablet Take 1 tablet by mouth every morning. 03/07/23  Yes [provider]  gabapentin  (NEURONTIN ) 100 MG capsule TAKE 1 CAPSULE BY MOUTH AT  BEDTIME 11/23/23  Yes Iruku, Praveena, MD  Neratinib  Maleate (NERLYNX ) 40 MG tablet Take 3 tablets (120 mg total) by mouth daily. Take with food. 02/11/24  Yes Iruku, Praveena, MD  tamoxifen  (NOLVADEX ) 20 MG tablet Take 1 tablet (20 mg total) by mouth daily. 10/28/23  Yes Iruku, Praveena, MD  VITAMIN D PO Take 1 tablet by mouth daily.   Yes [provider]    Blood pressure (!) 162/68, pulse 80, temperature 97.6 F (36.4 C), temperature source Oral, height 5' 7 (1.702 m), weight 170 lb (77.1 kg), SpO2 96%. Exam: General: Communicates without difficulty, well nourished, no acute distress. Head: Normocephalic, no evidence injury, no tenderness, facial buttresses intact without stepoff. Face/sinus: No tenderness to palpation and percussion. Facial movement is normal and symmetric. Eyes: PERRL, EOMI. No scleral icterus, conjunctivae clear. Neuro: CN II exam reveals vision grossly intact.  No nystagmus at any point of gaze. Ears: Auricles well formed without lesions.  Ear canals are intact without mass or lesion.  No erythema or edema is appreciated.  The TMs are intact without fluid. Nose: External evaluation reveals normal support and skin without lesions.  Dorsum is intact.  Anterior rhinoscopy reveals congested mucosa over anterior aspect of inferior turbinates and intact septum.  No purulence noted. Oral:  Oral cavity and oropharynx are intact,  symmetric, without erythema or edema.  Mucosa is moist without lesions. Neck: Full range of motion without pain.  There is no significant lymphadenopathy.  No masses palpable.  Thyroid  bed within normal limits to palpation.  Parotid glands and submandibular glands equal bilaterally without mass.  Trachea is midline. Neuro:  CN 2-12 grossly intact.   Procedure:  Flexible Nasal Endoscopy: Description: Risks, benefits, and alternatives of flexible endoscopy were explained to the patient.  Specific mention was made of the risk of throat numbness with difficulty swallowing, possible bleeding from the nose and mouth, and pain from the procedure.  The patient gave oral consent to proceed.  The flexible scope was inserted into the right nasal cavity.  Endoscopy of the interior nasal cavity, superior, inferior, and middle meatus was performed. The sphenoid-ethmoid recess was examined.  Mildly edematous mucosa was noted.  No polyp, mass, or lesion was appreciated. Olfactory cleft was clear.  Nasopharynx was clear.  Turbinates were normal.  The procedure was repeated on the contralateral side with similar  findings.  The patient tolerated the procedure well.   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 Nasal endoscopy reveals no physical obstruction. The condition is likely permanent due to the duration of symptoms. No evidence of sinus infection or nasal structural abnormalities contributing to symptoms.  - The physical exam and nasal endoscopy findings are reviewed with the patient. - Patient educated on the permanence of the condition and the importance of adaptation. - Advised to report any new symptoms or changes.  Geneveive Furness W Tareek Sabo 02/24/2024, 12:01 PM

## 2024-03-01 NOTE — Progress Notes (Unsigned)
 LILLETTE Ileana Collet, PhD, LAT, ATC acting as a scribe for Artist Lloyd, MD.  Tami Schmidt is a 89 y.o. female who presents to [facility] today for exacerbation of her bilat shoulder and L knee pain. Pt was last seen by Dr. Lloyd on 10/19/23 and was given a L knee and L shoulder steroid injections. Pt was also re-referred to PT, completing 16 visits.  Today, pt reports L knee is very painful. No relief from prior CSI. Shoulders also hurt, but she can deal w/ it.  Dx imaging: 09/01/23 L knee and R & L shoulder XR   Pertinent review of systems: No fevers or chills  Relevant historical information: Pulmonary embolism and diabetes   Exam:  BP (!) 178/88   Pulse 79   Ht 5' 7 (1.702 m)   SpO2 99%   BMI 26.63 kg/m  General: Well Developed, well nourished, and in no acute distress.   MSK: Left knee mild effusion normal motion.  Intact strength.  Left shoulder reduced range of motion.    Lab and Radiology Results  Procedure: Real-time Ultrasound Guided Injection of left knee joint superior lateral patella space Device: Philips Affiniti 50G/GE Logiq Images permanently stored and available for review in PACS Verbal informed consent obtained.  Discussed risks and benefits of procedure. Warned about infection, bleeding, hyperglycemia damage to structures among others. Patient expresses understanding and agreement Time-out conducted.   Noted no overlying erythema, induration, or other signs of local infection.   Skin prepped in a sterile fashion.   Local anesthesia: Topical Ethyl chloride.   With sterile technique and under real time ultrasound guidance: 40 mg of Kenalog and 2 mL of Marcaine  injected into knee joint. Fluid seen entering the joint capsule.   Completed without difficulty   Pain immediately resolved suggesting accurate placement of the medication.   Advised to call if fevers/chills, erythema, induration, drainage, or persistent bleeding.    Images permanently stored and available for review in the ultrasound unit.  Impression: Technically successful ultrasound guided injection.   Procedure: Real-time Ultrasound Guided Injection of left shoulder glenohumeral joint posterior approach Device: Philips Affiniti 50G/GE Logiq Images permanently stored and available for review in PACS Verbal informed consent obtained.  Discussed risks and benefits of procedure. Warned about infection, bleeding, hyperglycemia damage to structures among others. Patient expresses understanding and agreement Time-out conducted.   Noted no overlying erythema, induration, or other signs of local infection.   Skin prepped in a sterile fashion.   Local anesthesia: Topical Ethyl chloride.   With sterile technique and under real time ultrasound guidance: 40 mg of Kenalog and 2 mL of Marcaine  injected into glenohumeral. Fluid seen entering the joint capsule.   Completed without difficulty   Pain immediately resolved suggesting accurate placement of the medication.   Advised to call if fevers/chills, erythema, induration, drainage, or persistent bleeding.   Images permanently stored and available for review in the ultrasound unit.  Impression: Technically successful ultrasound guided injection.        Assessment and Plan: 89 y.o. female with left knee and bilateral shoulder pain due to DJD.  Plan for steroid injection left knee left shoulder today.  Will work on authorization for gel injections for her left knee.  Previous steroid injection only lasted a few weeks.  When she comes back to do the gel injections in the near future we can also do a right shoulder injection then with steroids if needed.   PDMP  not reviewed this encounter. Orders Placed This Encounter  Procedures   US  LIMITED JOINT SPACE STRUCTURES LOW LEFT(NO LINKED CHARGES)    Reason for Exam (SYMPTOM  OR DIAGNOSIS REQUIRED):   left knee pain    Preferred imaging location?:   [facility] [place]  [facility]   No orders of the defined types were placed in this encounter.    Discussed warning signs or symptoms. Please see discharge instructions. Patient expresses understanding.   The above documentation has been reviewed and is accurate and complete Artist Lloyd, M.D.

## 2024-03-02 ENCOUNTER — Other Ambulatory Visit: Payer: Self-pay

## 2024-03-02 ENCOUNTER — Telehealth: Payer: Self-pay

## 2024-03-02 ENCOUNTER — Ambulatory Visit: Admitting: Family Medicine

## 2024-03-02 VITALS — BP 178/88 | HR 79 | Ht 67.0 in

## 2024-03-02 DIAGNOSIS — M1712 Unilateral primary osteoarthritis, left knee: Secondary | ICD-10-CM | POA: Diagnosis not present

## 2024-03-02 DIAGNOSIS — M25511 Pain in right shoulder: Secondary | ICD-10-CM | POA: Diagnosis not present

## 2024-03-02 DIAGNOSIS — G8929 Other chronic pain: Secondary | ICD-10-CM

## 2024-03-02 DIAGNOSIS — M25562 Pain in left knee: Secondary | ICD-10-CM | POA: Diagnosis not present

## 2024-03-02 DIAGNOSIS — M25512 Pain in left shoulder: Secondary | ICD-10-CM | POA: Diagnosis not present

## 2024-03-02 NOTE — Patient Instructions (Signed)
 Thank you for coming in today.   You received an injection today. Seek immediate medical attention if the joint becomes red, extremely painful, or is oozing fluid.   We will work to authorize the gel shots for your knee. We will give you a call to schedule, once we get approval.

## 2024-03-02 NOTE — Telephone Encounter (Signed)
 Please auth gel shots, LEFT knee

## 2024-03-02 NOTE — Telephone Encounter (Signed)
 Ran benefits for left knee gelsyn case ID [id number]

## 2024-03-04 NOTE — Telephone Encounter (Signed)
 Can you schedule patient when medication is stocked   Gelsyn authorized for left knee NO PRE CERT REQUIRED  Patient responsible for 20% coinsurance Copay $15 Deductible does not apply  OOP [id number] has met $805.40 Once OOP has been met coverage goes to 100% and copay will no longer apply Medical notes may be requested at time of processing claims Reference # [id number]

## 2024-03-04 NOTE — Telephone Encounter (Signed)
 Patient received a cortisone injection on 11/19. Sent message to patient through MyChart informing of approval and to let us  know when she is ready to schedule.

## 2024-03-09 ENCOUNTER — Inpatient Hospital Stay

## 2024-03-09 ENCOUNTER — Inpatient Hospital Stay: Attending: Adult Health

## 2024-03-09 ENCOUNTER — Other Ambulatory Visit: Payer: Self-pay | Admitting: Pharmacist

## 2024-03-09 ENCOUNTER — Inpatient Hospital Stay: Admitting: Hematology and Oncology

## 2024-03-09 ENCOUNTER — Other Ambulatory Visit (HOSPITAL_COMMUNITY): Payer: Self-pay

## 2024-03-09 VITALS — BP 173/69 | HR 85 | Temp 98.1°F | Resp 16 | Wt 173.4 lb

## 2024-03-09 DIAGNOSIS — K5900 Constipation, unspecified: Secondary | ICD-10-CM | POA: Diagnosis not present

## 2024-03-09 DIAGNOSIS — Z170 Estrogen receptor positive status [ER+]: Secondary | ICD-10-CM

## 2024-03-09 DIAGNOSIS — C50312 Malignant neoplasm of lower-inner quadrant of left female breast: Secondary | ICD-10-CM

## 2024-03-09 DIAGNOSIS — D649 Anemia, unspecified: Secondary | ICD-10-CM | POA: Insufficient documentation

## 2024-03-09 DIAGNOSIS — G620 Drug-induced polyneuropathy: Secondary | ICD-10-CM | POA: Diagnosis not present

## 2024-03-09 DIAGNOSIS — E1142 Type 2 diabetes mellitus with diabetic polyneuropathy: Secondary | ICD-10-CM | POA: Diagnosis not present

## 2024-03-09 DIAGNOSIS — Z79810 Long term (current) use of selective estrogen receptor modulators (SERMs): Secondary | ICD-10-CM | POA: Insufficient documentation

## 2024-03-09 LAB — CBC WITH DIFFERENTIAL/PLATELET
Abs Immature Granulocytes: 0.01 K/uL (ref 0.00–0.07)
Basophils Absolute: 0 K/uL (ref 0.0–0.1)
Basophils Relative: 1 %
Eosinophils Absolute: 0.1 K/uL (ref 0.0–0.5)
Eosinophils Relative: 2 %
HCT: 34.6 % — ABNORMAL LOW (ref 36.0–46.0)
Hemoglobin: 11.1 g/dL — ABNORMAL LOW (ref 12.0–15.0)
Immature Granulocytes: 0 %
Lymphocytes Relative: 26 %
Lymphs Abs: 1.5 K/uL (ref 0.7–4.0)
MCH: 25.5 pg — ABNORMAL LOW (ref 26.0–34.0)
MCHC: 32.1 g/dL (ref 30.0–36.0)
MCV: 79.4 fL — ABNORMAL LOW (ref 80.0–100.0)
Monocytes Absolute: 0.4 K/uL (ref 0.1–1.0)
Monocytes Relative: 7 %
Neutro Abs: 3.7 K/uL (ref 1.7–7.7)
Neutrophils Relative %: 64 %
Platelets: 190 K/uL (ref 150–400)
RBC: 4.36 MIL/uL (ref 3.87–5.11)
RDW: 15.7 % — ABNORMAL HIGH (ref 11.5–15.5)
WBC: 5.8 K/uL (ref 4.0–10.5)
nRBC: 0 % (ref 0.0–0.2)

## 2024-03-09 LAB — CMP (CANCER CENTER ONLY)
ALT: 8 U/L (ref 0–44)
AST: 17 U/L (ref 15–41)
Albumin: 4 g/dL (ref 3.5–5.0)
Alkaline Phosphatase: 73 U/L (ref 38–126)
Anion gap: 9 (ref 5–15)
BUN: 18 mg/dL (ref 8–23)
CO2: 25 mmol/L (ref 22–32)
Calcium: 8.8 mg/dL — ABNORMAL LOW (ref 8.9–10.3)
Chloride: 107 mmol/L (ref 98–111)
Creatinine: 0.82 mg/dL (ref 0.44–1.00)
GFR, Estimated: >60 mL/min (ref >60)
Glucose, Bld: 143 mg/dL — ABNORMAL HIGH (ref 70–99)
Potassium: 4.1 mmol/L (ref 3.5–5.1)
Sodium: 141 mmol/L (ref 135–145)
Total Bilirubin: 0.3 mg/dL (ref 0.0–1.2)
Total Protein: 6.8 g/dL (ref 6.5–8.1)

## 2024-03-09 LAB — MAGNESIUM: Magnesium: 2 mg/dL (ref 1.7–2.4)

## 2024-03-09 MED ORDER — NERATINIB MALEATE 40 MG PO TABS: 120.0000 mg | ORAL_TABLET | Freq: Every day | ORAL | 1 refills | Status: DC

## 2024-03-09 NOTE — Progress Notes (Signed)
 [facility] Cancer Follow up:    Tami Madden, MD [address] [place] [address]   DIAGNOSIS:  Cancer Staging  Chest wall recurrence of breast cancer [facility]) Staging form: Breast, AJCC 7th Edition - Clinical: Stage Unknown (TX, N1, M0) - Signed by Layla Sandria BROCKS, MD on 07/27/2014  Malignant neoplasm of lower-inner quadrant of left breast in female, estrogen receptor positive (HCC) Staging form: Breast, AJCC 7th Edition - Clinical: Stage IA (T1b, N0, M0) - Signed by Layla Sandria BROCKS, MD on 07/27/2014   SUMMARY OF ONCOLOGIC HISTORY: 1993: Right lumpectomy axillary dissection in [place]  (stage I) treated with radiation and tamoxifen  x 5 years 2002: Right mastectomy: Right breast recurrence 2004: Left mastectomy: 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  CURRENT THERAPY: Neratinib /tamoxifen   INTERVAL HISTORY:  Tami Schmidt 89 y.o. female returns for follow-up while on neratinib  and tamoxifen .  Discussed the use of AI scribe software for clinical note transcription with the patient, who gave verbal consent to proceed.  History of Present Illness  Tami Schmidt is an 89 year old female who presents for follow-up regarding her ongoing cancer treatment and symptoms.  She experiences persistent fatigue since her last visit in October, which she attributes to her medication.  She reports constipation as a side effect of her medication regimen. Her neuropathy is mild and no  longer disturbs her sleep. No diarrhea is reported.  She has a loss of smell, which was evaluated by an ENT specialist. The specialist indicated that the sense of smell may not return, and she feels the visit was a 'waste of time and money.'  No swelling, abdominal pain, or changes in breathing since the last visit.  Socially, she engages in cooking and baking, although she does not taste her food, impacting her enjoyment.   Patient Active Problem List   Diagnosis Date Noted   Acute lower GI bleeding 01/07/2023   Rectal bleeding 01/05/2023   Acute GI bleeding 10/04/2022   Port-A-Cath in place 06/12/2022   Loss of perception for taste 03/17/2022   Primary osteoarthritis involving multiple joints 03/17/2022   Genetic testing 07/24/2021   Microcytosis 04/29/2021   Hypokalemia 04/28/2021   Pulmonary embolism (HCC)    Agnosia 08/09/2020   Hardening of the aorta (main artery of the heart) 08/09/2020   Morbid obesity (HCC) 08/09/2020   Neuropathy 08/09/2020   Primary osteoarthritis 08/09/2020   Sensorineural hearing loss (SNHL) of both ears 08/07/2020   Anosmia 06/05/2020   Tinnitus, bilateral 06/05/2020   GI bleed 07/13/2019   ABLA (acute blood loss anemia) 07/12/2019   Microcytic anemia 01/05/2018   Melena 01/04/2018   Lower GI bleed 01/04/2018   Diabetes mellitus type II, controlled, with no complications (HCC) 12/05/2017   Tenosynovitis of left wrist 12/02/2017   Infection of left wrist (HCC) 11/30/2017   Primary osteoarthritis of right hip 01/05/2017   Osteoarthritis of right hip 01/03/2017   Lymphedema of upper extremity 01/17/2014   Osteopenia 01/17/2014   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  Dilated pore of [place] 08/02/2013   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   Diabetes mellitus    fasting 90-100   HTN (hypertension) 03/25/2013   Hx of radiation therapy    breasts hx   Hypercholesterolemia    Hypertension    Lymphedema of arm    left arm   Pulmonary embolism (HCC)    Type II or unspecified type diabetes mellitus without mention of complication, not stated as uncontrolled 03/25/2013    SURGICAL HISTORY: Past Surgical History:  Procedure Laterality Date   ABDOMINAL HYSTERECTOMY     ANKLE SURGERY Right 1995   APPENDECTOMY     BIOPSY  01/07/2018   Procedure: BIOPSY;  Surgeon: Saintclair Jasper, MD;  Location: [facility];  Service: Gastroenterology;;   BREAST SURGERY Bilateral    mastectomy   COLONOSCOPY WITH PROPOFOL  N/A 01/07/2018   Procedure: COLONOSCOPY WITH PROPOFOL ;  Surgeon: Saintclair Jasper, MD;  Location: [facility];  Service: Gastroenterology;  Laterality: N/A;   ESOPHAGOGASTRODUODENOSCOPY (EGD) WITH PROPOFOL  N/A 01/06/2018   Procedure: ESOPHAGOGASTRODUODENOSCOPY (EGD) WITH PROPOFOL ;  Surgeon: Saintclair Jasper, MD;  Location: [facility];  Service: Gastroenterology;  Laterality: N/A;   HERNIA REPAIR  04-26-2010   IR IMAGING GUIDED PORT INSERTION  10/29/2021   MASS EXCISION Left 01/26/2013   Procedure: EXCISION LEFT CHEST WALL MASS AND LEFT ABDOMNAL WALL MASS;  Surgeon: Vicenta DELENA Poli, MD;  Location: [facility] OR;  Service: General;  Laterality: Left;   MASS EXCISION Left 09/20/2014   Procedure: EXCISION OF LEFT CHEST WALL MASS;  Surgeon: Vicenta Poli, MD;  Location: [facility];  Service: General;  Laterality: Left;   TEE WITHOUT CARDIOVERSION N/A 12/08/2017   Procedure: TRANSESOPHAGEAL ECHOCARDIOGRAM (TEE);  Surgeon: Pietro Redell RAMAN, MD;  Location: [facility];  Service: Cardiovascular;  Laterality: N/A;   TOTAL HIP ARTHROPLASTY Right 01/05/2017   TOTAL HIP ARTHROPLASTY Right 01/05/2017   Procedure: TOTAL HIP ARTHROPLASTY ANTERIOR APPROACH;  Surgeon: Liam Lerner, MD;  Location: [facility] OR;  Service: Orthopedics;  Laterality: Right;    SOCIAL HISTORY: Social History   Socioeconomic History   Marital status: Married    Spouse name: Not on file   Number of children: Not on file   Years of education: Not on file   Highest education level: Not on file  Occupational History   Occupation: retired child psychotherapist  Tobacco Use   Smoking status: Former    Current packs/day: 0.00    Types: Cigarettes    Quit date: 04/14/1972    Years since quitting: 51.9   Smokeless tobacco: Never  Vaping Use   Vaping status: Never Used  Substance and Sexual Activity   Alcohol use: Not Currently    Comment: occasional   Drug use: Not Currently   Sexual activity: Yes    Birth control/protection: Surgical  Other Topics Concern   Not  on file  Social History Narrative   Not on file   Social Drivers of Health   Financial Resource Strain: Not on file  Food Insecurity: No Food Insecurity (05/21/2023)   Hunger Vital Sign    Worried About Running Out of Food in the Last Year: Never true    Ran Out of Food in the Last Year: Never true  Transportation Needs: No Transportation Needs (05/21/2023)   PRAPARE - Administrator, Civil Service (Medical): No    Lack of Transportation (Non-Medical): No  Physical Activity: Not on file  Stress: Not on file  Social Connections: Socially Integrated (05/21/2023)   Social Connection and Isolation Panel    Frequency of Communication with Friends and Family: More than three times a week     Frequency of Social Gatherings with Friends and Family: More than three times a week    Attends Religious Services: More than 4 times per year    Active Member of [employer] or Organizations: Yes    Attends Engineer, Structural: More than 4 times per year    Marital Status: Married  Catering Manager Violence: Not At Risk (05/21/2023)   Humiliation, Afraid, Rape, and Kick questionnaire    Fear of Current or Ex-Partner: No    Emotionally Abused: No    Physically Abused: No    Sexually Abused: No    FAMILY HISTORY: Family History  Problem Relation Age of Onset   Breast cancer Cousin        maternal first cousin   Uterine cancer Cousin        maternal first cousin     PHYSICAL EXAMINATION    BP (!) 173/69 (BP Location: Right Arm, Patient Position: Sitting)   Pulse 85   Temp 98.1 F (36.7 C) (Temporal)   Resp 16   Wt 173 lb 6.4 oz (78.7 kg)   SpO2 100%   BMI 27.16 kg/m     Physical Exam Constitutional:      General: She is not in acute distress.    Appearance: Normal appearance. She is not toxic-appearing.  HENT:     Head: Normocephalic and atraumatic.     Mouth/Throat:     Mouth: Mucous membranes are moist.     Pharynx: Oropharynx is clear. No oropharyngeal exudate or posterior oropharyngeal erythema.  Eyes:     General: No scleral icterus. Cardiovascular:     Rate and Rhythm: Normal rate and regular rhythm.     Pulses: Normal pulses.     Heart sounds: Normal heart sounds.  Pulmonary:     Effort: Pulmonary effort is normal.     Breath sounds: Normal breath sounds.  Abdominal:     General: Abdomen is flat. Bowel sounds are normal. There is no distension.     Palpations: Abdomen is soft.     Tenderness: There is no abdominal tenderness.  Musculoskeletal:        General: Swelling (left arm lymphedema) present.     Cervical back: Neck supple.  Lymphadenopathy:     Cervical: No cervical adenopathy.  Skin:    General: Skin is warm and dry.     Findings: No  rash.  Neurological:     General: No focal deficit present.     Mental Status: She is alert.  Psychiatric:        Mood and Affect: Mood normal.        Behavior: Behavior normal.     LABORATORY DATA:  CBC  Component Value Date/Time   WBC 5.8 03/09/2024 1349   RBC 4.36 03/09/2024 1349   HGB 11.1 (L) 03/09/2024 1349   HGB 11.1 (L) 01/06/2024 1326   HGB 10.5 (L) 01/20/2017 1057   HCT 34.6 (L) 03/09/2024 1349   HCT 25.8 (L) 01/05/2018 0513   HCT 32.8 (L) 01/20/2017 1057   PLT 190 03/09/2024 1349   PLT 186 01/06/2024 1326   PLT 368 01/20/2017 1057   MCV 79.4 (L) 03/09/2024 1349   MCV 77.8 (L) 01/20/2017 1057   MCH 25.5 (L) 03/09/2024 1349   MCHC 32.1 03/09/2024 1349   RDW 15.7 (H) 03/09/2024 1349   RDW 16.0 (H) 01/20/2017 1057   LYMPHSABS 1.5 03/09/2024 1349   LYMPHSABS 1.3 01/20/2017 1057   MONOABS 0.4 03/09/2024 1349   MONOABS 0.4 01/20/2017 1057   EOSABS 0.1 03/09/2024 1349   EOSABS 0.1 01/20/2017 1057   BASOSABS 0.0 03/09/2024 1349   BASOSABS 0.1 01/20/2017 1057    CMP     Component Value Date/Time   NA 139 02/11/2024 1029   NA 139 01/20/2017 1057   K 3.7 02/11/2024 1029   K 3.9 01/20/2017 1057   CL 105 02/11/2024 1029   CO2 27 02/11/2024 1029   CO2 25 01/20/2017 1057   GLUCOSE 181 (H) 02/11/2024 1029   GLUCOSE 135 01/20/2017 1057   BUN 16 02/11/2024 1029   BUN 15.6 01/20/2017 1057   CREATININE 0.65 02/11/2024 1029   CREATININE 0.7 01/20/2017 1057   CALCIUM 8.6 (L) 02/11/2024 1029   CALCIUM 9.3 01/20/2017 1057   PROT 6.7 02/11/2024 1029   PROT 7.1 01/20/2017 1057   ALBUMIN 3.8 02/11/2024 1029   ALBUMIN 3.4 (L) 01/20/2017 1057   AST 12 (L) 02/11/2024 1029   AST 10 01/20/2017 1057   ALT 7 02/11/2024 1029   ALT 14 01/20/2017 1057   ALKPHOS 66 02/11/2024 1029   ALKPHOS 69 01/20/2017 1057   BILITOT 0.5 02/11/2024 1029   BILITOT 0.43 01/20/2017 1057   GFRNONAA >60 02/11/2024 1029   GFRAA >60 07/14/2019 0538     ASSESSMENT and THERAPY PLAN:    Assessment and Plan Assessment & Plan Breast cancer, metastatic with non regional LN on tamoxifen  and neratinib  - Continue Nerlynx  and tamoxifen . - Recent imaging with stable disease - No clinical concern for recurrence.  Peripheral neuropathy secondary to chemotherapy Peripheral neuropathy is mild and well-controlled with gabapentin  100 mg, taken as two tablets at night. - Continue gabapentin  100 mg, two tablets at night.  Constipation Use stool softeners and miralax  PRN  Mild anemia Present with stable hemoglobin levels over the past months. No significant changes noted. - Continue monitoring hemoglobin levels.   All questions were answered. The patient knows to call the clinic with any problems, questions or concerns. We can certainly see the patient much sooner if necessary.  Total encounter time:30 minutes*in face-to-face visit time, chart review, lab review, care coordination, order entry, and documentation of the encounter time.  *Total Encounter Time as defined by the Centers for Medicare and Medicaid Services includes, in addition to the face-to-face time of a patient visit (documented in the note above) non-face-to-face time: obtaining and reviewing outside history, ordering and reviewing medications, tests or procedures, care coordination (communications with other health care professionals or caregivers) and documentation in the medical record.

## 2024-04-08 ENCOUNTER — Telehealth: Payer: Self-pay

## 2024-04-08 NOTE — Telephone Encounter (Addendum)
 PAP reenrollment for year 2026    Application has been submitted for Nerlynx  through [facility]  with both Patient and doctor signatures, along with requested documentation.  Status: Pending review (application has been received) *05-05-24 [place] approval letter and proof of copay have been faxed to [facility] as requested Prescription request for a bridge fill until determination has been made has also been requested  Charlott Hamilton,  CPhT-Adv  she/her/hers [place]  [facility] Pharmacy Technician [facility] Phone: [phone]  Fax: [phone] [email or web address]

## 2024-04-15 ENCOUNTER — Inpatient Hospital Stay: Attending: Adult Health

## 2024-04-15 ENCOUNTER — Inpatient Hospital Stay: Attending: Adult Health | Admitting: Hematology and Oncology

## 2024-04-15 VITALS — BP 153/65 | HR 91 | Temp 98.2°F | Resp 16 | Wt 173.1 lb

## 2024-04-15 DIAGNOSIS — Z170 Estrogen receptor positive status [ER+]: Secondary | ICD-10-CM | POA: Diagnosis not present

## 2024-04-15 DIAGNOSIS — C50312 Malignant neoplasm of lower-inner quadrant of left female breast: Secondary | ICD-10-CM

## 2024-04-15 LAB — CMP (CANCER CENTER ONLY)
ALT: 9 U/L (ref 0–44)
AST: 19 U/L (ref 15–41)
Albumin: 4.2 g/dL (ref 3.5–5.0)
Alkaline Phosphatase: 66 U/L (ref 38–126)
Anion gap: 11 (ref 5–15)
BUN: 17 mg/dL (ref 8–23)
CO2: 24 mmol/L (ref 22–32)
Calcium: 9 mg/dL (ref 8.9–10.3)
Chloride: 103 mmol/L (ref 98–111)
Creatinine: 0.68 mg/dL (ref 0.44–1.00)
GFR, Estimated: >60 mL/min
Glucose, Bld: 117 mg/dL — ABNORMAL HIGH (ref 70–99)
Potassium: 3.9 mmol/L (ref 3.5–5.1)
Sodium: 138 mmol/L (ref 135–145)
Total Bilirubin: 0.4 mg/dL (ref 0.0–1.2)
Total Protein: 7.2 g/dL (ref 6.5–8.1)

## 2024-04-15 LAB — CBC WITH DIFFERENTIAL/PLATELET
Abs Immature Granulocytes: 0.04 K/uL (ref 0.00–0.07)
Basophils Absolute: 0 K/uL (ref 0.0–0.1)
Basophils Relative: 1 %
Eosinophils Absolute: 0.2 K/uL (ref 0.0–0.5)
Eosinophils Relative: 3 %
HCT: 36.4 % (ref 36.0–46.0)
Hemoglobin: 11.8 g/dL — ABNORMAL LOW (ref 12.0–15.0)
Immature Granulocytes: 1 %
Lymphocytes Relative: 26 %
Lymphs Abs: 2 K/uL (ref 0.7–4.0)
MCH: 25.5 pg — ABNORMAL LOW (ref 26.0–34.0)
MCHC: 32.4 g/dL (ref 30.0–36.0)
MCV: 78.8 fL — ABNORMAL LOW (ref 80.0–100.0)
Monocytes Absolute: 0.5 K/uL (ref 0.1–1.0)
Monocytes Relative: 6 %
Neutro Abs: 4.8 K/uL (ref 1.7–7.7)
Neutrophils Relative %: 63 %
Platelets: 250 K/uL (ref 150–400)
RBC: 4.62 MIL/uL (ref 3.87–5.11)
RDW: 15.4 % (ref 11.5–15.5)
WBC: 7.6 K/uL (ref 4.0–10.5)
nRBC: 0 % (ref 0.0–0.2)

## 2024-04-15 LAB — MAGNESIUM: Magnesium: 1.8 mg/dL (ref 1.7–2.4)

## 2024-04-15 MED ORDER — NERATINIB MALEATE 40 MG PO TABS: 120.0000 mg | ORAL_TABLET | Freq: Every day | ORAL | 1 refills | Status: DC

## 2024-04-15 NOTE — Progress Notes (Signed)
 [facility] Cancer Follow up:    Tami Madden, MD [address] [place] [address]   DIAGNOSIS:  Cancer Staging  Chest wall recurrence of breast cancer [facility]) Staging form: Breast, AJCC 7th Edition - Clinical: Stage Unknown (TX, N1, M0) - Signed by Layla Sandria BROCKS, MD on 07/27/2014  Malignant neoplasm of lower-inner quadrant of left breast in female, estrogen receptor positive (HCC) Staging form: Breast, AJCC 7th Edition - Clinical: Stage IA (T1b, N0, M0) - Signed by Layla Sandria BROCKS, MD on 07/27/2014   SUMMARY OF ONCOLOGIC HISTORY: 1993: Right lumpectomy axillary dissection in [place]  (stage I) treated with radiation and tamoxifen  x 5 years 2002: Right mastectomy: Right breast recurrence 2004: Left mastectomy: 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  CURRENT THERAPY: Neratinib /tamoxifen   INTERVAL HISTORY:  Tami Schmidt 89 y.o. female returns for follow-up while on neratinib  and tamoxifen .  Discussed the use of AI scribe software for clinical note transcription with the patient, who gave verbal consent to proceed.  History of Present Illness  Tami Schmidt is an 89 year old female with metastatic ER-positive breast cancer who presents for routine oncology follow-up.  She has metastatic ER-positive Her 2 positive breast cancer involving the left lower-inner quadrant, currently stage IV, with stable disease on neratinib  and tamoxifen . She denies new or worsening symptoms,  including diarrhea or nausea.  She describes persistent fatigue and malaise, which she attributes to her medication regimen. She reports decreased ability to perform household tasks and now requires assistance from her husband, though she continues to cook. She has experienced weight gain, which she finds undesirable, but denies any recent weight loss.  She manages chronic constipation with Miralax , resulting in regular bowel movements. She denies diarrhea or nausea. She experiences nocturia, urinating two to three times nightly, which she attributes to aging, and denies dysuria or other urinary symptoms.  She expresses psychosocial distress regarding her prognosis and functional limitations, and inquires about disease status and the concept of remission.   Patient Active Problem List   Diagnosis Date Noted   Acute lower GI bleeding 01/07/2023   Rectal bleeding 01/05/2023   Acute GI bleeding 10/04/2022   Port-A-Cath in place 06/12/2022   Loss of perception for taste 03/17/2022   Primary osteoarthritis involving multiple joints 03/17/2022   Genetic testing 07/24/2021   Microcytosis 04/29/2021   Hypokalemia 04/28/2021   Pulmonary embolism (HCC)    Agnosia 08/09/2020   Hardening of the aorta (main artery of the heart) 08/09/2020   Morbid obesity (HCC) 08/09/2020   Neuropathy 08/09/2020   Primary osteoarthritis 08/09/2020   Sensorineural hearing loss (SNHL) of both ears 08/07/2020   Anosmia 06/05/2020   Tinnitus, bilateral 06/05/2020   GI bleed 07/13/2019   ABLA (acute blood loss anemia) 07/12/2019   Microcytic anemia 01/05/2018   Melena 01/04/2018   Lower GI bleed 01/04/2018   Diabetes mellitus type II, controlled, with no complications (HCC) 12/05/2017   Tenosynovitis of left wrist 12/02/2017   Infection of left wrist (HCC) 11/30/2017   Primary osteoarthritis of right hip 01/05/2017   Osteoarthritis  of right hip 01/03/2017   Lymphedema of upper extremity 01/17/2014   Osteopenia  01/17/2014   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   Dilated pore of [place] 08/02/2013   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   Diabetes mellitus    fasting 90-100   HTN (hypertension) 03/25/2013   Hx of radiation therapy    breasts hx   Hypercholesterolemia    Hypertension    Lymphedema of arm    left arm   Pulmonary embolism (HCC)    Type II or unspecified type diabetes mellitus without mention of complication, not stated as uncontrolled 03/25/2013    SURGICAL HISTORY: Past Surgical History:  Procedure Laterality Date   ABDOMINAL HYSTERECTOMY     ANKLE SURGERY Right 1995   APPENDECTOMY     BIOPSY  01/07/2018   Procedure: BIOPSY;  Surgeon: Saintclair Jasper, MD;  Location: [facility];  Service: Gastroenterology;;   BREAST SURGERY Bilateral    mastectomy   COLONOSCOPY WITH PROPOFOL  N/A 01/07/2018   Procedure: COLONOSCOPY WITH PROPOFOL ;  Surgeon: Saintclair Jasper, MD;  Location: [facility];  Service: Gastroenterology;  Laterality: N/A;   ESOPHAGOGASTRODUODENOSCOPY (EGD) WITH PROPOFOL  N/A 01/06/2018   Procedure: ESOPHAGOGASTRODUODENOSCOPY (EGD) WITH PROPOFOL ;  Surgeon: Saintclair Jasper, MD;  Location: [facility];  Service: Gastroenterology;  Laterality: N/A;   HERNIA REPAIR  04-26-2010   IR  IMAGING GUIDED PORT INSERTION  10/29/2021   MASS EXCISION Left 01/26/2013   Procedure: EXCISION LEFT CHEST WALL MASS AND LEFT ABDOMNAL WALL MASS;  Surgeon: Vicenta DELENA Poli, MD;  Location: [facility] OR;  Service: General;  Laterality: Left;   MASS EXCISION Left 09/20/2014   Procedure: EXCISION OF LEFT CHEST WALL MASS;  Surgeon: Vicenta Poli, MD;  Location:  SURGERY CENTER;  Service: General;  Laterality: Left;   TEE WITHOUT CARDIOVERSION N/A 12/08/2017   Procedure: TRANSESOPHAGEAL ECHOCARDIOGRAM (TEE);  Surgeon: Pietro Redell RAMAN, MD;  Location: [facility] ENDOSCOPY;  Service: Cardiovascular;  Laterality: N/A;   TOTAL HIP ARTHROPLASTY Right 01/05/2017   TOTAL HIP ARTHROPLASTY Right 01/05/2017   Procedure: TOTAL HIP ARTHROPLASTY ANTERIOR APPROACH;  Surgeon: Liam Lerner, MD;  Location: [facility] OR;  Service: Orthopedics;  Laterality: Right;    SOCIAL HISTORY: Social History   Socioeconomic History   Marital status: Married    Spouse name: Not on file   Number of children: Not on file   Years of education: Not on file   Highest education level: Not on file  Occupational History   Occupation: retired child psychotherapist  Tobacco Use   Smoking status: Former    Current packs/day: 0.00    Types: Cigarettes    Quit date: 04/14/1972    Years since quitting: 52.0   Smokeless tobacco: Never  Vaping Use   Vaping status: Never Used  Substance and Sexual Activity   Alcohol use: Not  Currently    Comment: occasional   Drug use: Not Currently   Sexual activity: Yes    Birth control/protection: Surgical  Other Topics Concern   Not on file  Social History Narrative   Not on file   Social Drivers of Health   Tobacco Use: Medium Risk (02/23/2024)   Patient History    Smoking Tobacco Use: Former    Smokeless Tobacco Use: Never    Passive Exposure: Not on Actuary Strain: Not on file  Food Insecurity: No Food Insecurity (05/21/2023)   Hunger Vital Sign    Worried About Running Out of Food  in the Last Year: Never true    Ran Out of Food in the Last Year: Never true  Transportation Needs: No Transportation Needs (05/21/2023)   PRAPARE - Administrator, Civil Service (Medical): No    Lack of Transportation (Non-Medical): No  Physical Activity: Not on file  Stress: Not on file  Social Connections: Socially Integrated (05/21/2023)   Social Connection and Isolation Panel    Frequency of Communication with Friends and Family: More than three times a week    Frequency of Social Gatherings with Friends and Family: More than three times a week    Attends Religious Services: More than 4 times per year    Active Member of [employer] or Organizations: Yes    Attends Engineer, Structural: More than 4 times per year    Marital Status: Married  Catering Manager Violence: Not At Risk (05/21/2023)   Humiliation, Afraid, Rape, and Kick questionnaire    Fear of Current or Ex-Partner: No    Emotionally Abused: No    Physically Abused: No    Sexually Abused: No  Depression (PHQ2-9): Low Risk (01/26/2024)   Depression (PHQ2-9)    PHQ-2 Score: 0  Alcohol Screen: Not on file  Housing: Low Risk (05/21/2023)   Housing Stability Vital Sign    Unable to Pay for Housing in the Last Year: No    Number of Times Moved in the Last Year: 0    Homeless in the Last Year: No  Utilities: Not At Risk (05/21/2023)   AHC Utilities    Threatened with loss of utilities: No  Health Literacy: Not on file    FAMILY HISTORY: Family History  Problem Relation Age of Onset   Breast cancer Cousin        maternal first cousin   Uterine cancer Cousin        maternal first cousin     PHYSICAL EXAMINATION    BP (!) 153/65 (BP Location: Right Arm, Patient Position: Sitting)   Pulse 91   Temp 98.2 F (36.8 C) (Temporal)   Resp 16   Wt 173 lb 1.6 oz (78.5 kg)   SpO2 99%   BMI 27.11 kg/m     Physical Exam Constitutional:      General: She is not in acute distress.    Appearance: Normal  appearance. She is not toxic-appearing.  HENT:     Head: Normocephalic and atraumatic.     Mouth/Throat:     Mouth: Mucous membranes are moist.     Pharynx: Oropharynx is clear. No oropharyngeal exudate or posterior oropharyngeal erythema.  Eyes:     General: No scleral icterus. Cardiovascular:     Rate and Rhythm: Normal rate and regular rhythm.     Pulses: Normal pulses.     Heart sounds: Normal heart sounds.  Pulmonary:  Effort: Pulmonary effort is normal.     Breath sounds: Normal breath sounds.  Abdominal:     General: Abdomen is flat. Bowel sounds are normal. There is no distension.     Palpations: Abdomen is soft.     Tenderness: There is no abdominal tenderness.  Musculoskeletal:        General: Swelling (left arm lymphedema, this has improved overall) present.     Cervical back: Neck supple.  Lymphadenopathy:     Cervical: No cervical adenopathy.  Skin:    General: Skin is warm and dry.     Findings: No rash.  Neurological:     General: No focal deficit present.     Mental Status: She is alert.  Psychiatric:        Mood and Affect: Mood normal.        Behavior: Behavior normal.     LABORATORY DATA:  CBC    Component Value Date/Time   WBC 7.6 04/15/2024 1315   RBC 4.62 04/15/2024 1315   HGB 11.8 (L) 04/15/2024 1315   HGB 11.1 (L) 01/06/2024 1326   HGB 10.5 (L) 01/20/2017 1057   HCT 36.4 04/15/2024 1315   HCT 25.8 (L) 01/05/2018 0513   HCT 32.8 (L) 01/20/2017 1057   PLT 250 04/15/2024 1315   PLT 186 01/06/2024 1326   PLT 368 01/20/2017 1057   MCV 78.8 (L) 04/15/2024 1315   MCV 77.8 (L) 01/20/2017 1057   MCH 25.5 (L) 04/15/2024 1315   MCHC 32.4 04/15/2024 1315   RDW 15.4 04/15/2024 1315   RDW 16.0 (H) 01/20/2017 1057   LYMPHSABS 2.0 04/15/2024 1315   LYMPHSABS 1.3 01/20/2017 1057   MONOABS 0.5 04/15/2024 1315   MONOABS 0.4 01/20/2017 1057   EOSABS 0.2 04/15/2024 1315   EOSABS 0.1 01/20/2017 1057   BASOSABS 0.0 04/15/2024 1315   BASOSABS 0.1  01/20/2017 1057    CMP     Component Value Date/Time   NA 141 03/09/2024 1349   NA 139 01/20/2017 1057   K 4.1 03/09/2024 1349   K 3.9 01/20/2017 1057   CL 107 03/09/2024 1349   CO2 25 03/09/2024 1349   CO2 25 01/20/2017 1057   GLUCOSE 143 (H) 03/09/2024 1349   GLUCOSE 135 01/20/2017 1057   BUN 18 03/09/2024 1349   BUN 15.6 01/20/2017 1057   CREATININE 0.82 03/09/2024 1349   CREATININE 0.7 01/20/2017 1057   CALCIUM 8.8 (L) 03/09/2024 1349   CALCIUM 9.3 01/20/2017 1057   PROT 6.8 03/09/2024 1349   PROT 7.1 01/20/2017 1057   ALBUMIN 4.0 03/09/2024 1349   ALBUMIN 3.4 (L) 01/20/2017 1057   AST 17 03/09/2024 1349   AST 10 01/20/2017 1057   ALT 8 03/09/2024 1349   ALT 14 01/20/2017 1057   ALKPHOS 73 03/09/2024 1349   ALKPHOS 69 01/20/2017 1057   BILITOT 0.3 03/09/2024 1349   BILITOT 0.43 01/20/2017 1057   GFRNONAA >60 03/09/2024 1349   GFRAA >60 07/14/2019 0538     ASSESSMENT and THERAPY PLAN:   Assessment and Plan Assessment & Plan Breast cancer, metastatic with non regional LN on tamoxifen  and neratinib  Controlled metastatic disease on neratinib  and tamoxifen ,  - Continued neratinib  and tamoxifen  at current doses. - Ordered interval imaging in January to reassess disease status. - Planned to review imaging and adjust treatment as indicated based on results. - Scheduled follow-up in one month.  Constipation Chronic constipation managed with polyethylene glycol.   All questions were answered. The patient knows  to call the clinic with any problems, questions or concerns. We can certainly see the patient much sooner if necessary.  Total encounter time: 20 minutes*in face-to-face visit time, chart review, lab review, care coordination, order entry, and documentation of the encounter time.  *Total Encounter Time as defined by the Centers for Medicare and Medicaid Services includes, in addition to the face-to-face time of a patient visit (documented in the note above)  non-face-to-face time: obtaining and reviewing outside history, ordering and reviewing medications, tests or procedures, care coordination (communications with other health care professionals or caregivers) and documentation in the medical record.

## 2024-04-27 ENCOUNTER — Ambulatory Visit: Payer: Self-pay | Admitting: Family

## 2024-04-27 ENCOUNTER — Encounter: Payer: Self-pay | Admitting: Family

## 2024-04-27 VITALS — BP 142/72 | HR 95 | Temp 97.3°F | Ht 67.0 in | Wt 179.2 lb

## 2024-04-27 DIAGNOSIS — R29898 Other symptoms and signs involving the musculoskeletal system: Secondary | ICD-10-CM

## 2024-04-27 DIAGNOSIS — H6123 Impacted cerumen, bilateral: Secondary | ICD-10-CM | POA: Diagnosis not present

## 2024-04-27 DIAGNOSIS — M159 Polyosteoarthritis, unspecified: Secondary | ICD-10-CM

## 2024-04-27 DIAGNOSIS — R296 Repeated falls: Secondary | ICD-10-CM

## 2024-04-27 DIAGNOSIS — G629 Polyneuropathy, unspecified: Secondary | ICD-10-CM | POA: Diagnosis not present

## 2024-04-27 DIAGNOSIS — I10 Essential (primary) hypertension: Secondary | ICD-10-CM | POA: Diagnosis not present

## 2024-04-27 DIAGNOSIS — E1142 Type 2 diabetes mellitus with diabetic polyneuropathy: Secondary | ICD-10-CM | POA: Insufficient documentation

## 2024-04-27 MED ORDER — DEBROX 6.5 % OT SOLN: 5.0000 [drp] | Freq: Two times a day (BID) | OTIC | 0 refills | Status: AC

## 2024-04-27 NOTE — Progress Notes (Signed)
 "  Provider: Khairi Garman FNP-C   Vipul Cafarelli, Roxan BROCKS, NP  Patient Care Team: Carma Dwiggins, Roxan BROCKS, NP as PCP - General (Family Medicine) Irving Delinda ORN, MD as Consulting Physician (Family Medicine) Loretha Ash, MD as Consulting Physician (Hematology and Oncology) Joane Artist RAMAN, MD as Consulting Physician [facility] Medicine)  Extended Emergency Contact Information Primary Emergency Contact: [facility] Address: [address]          [place], [place] [address] [place] Home Phone: [phone] Mobile Phone: [phone] Relation: Spouse  Code Status: Full code Goals of care: Advanced Directive information    12/01/2023   11:09 AM  Advanced Directives  Does Patient Have a Medical Advance Directive? No  Does patient want to make changes to medical advance directive? No - Patient declined     Chief Complaint  Patient presents with   Medication management of chronic issues    3 month follow up. Discuss the need for annual wellness and eye screening     History of Present Illness   Tami Schmidt is an 89 year old female who presents with weakness and balance issues.  She experienced a significant fall two weeks ago, resulting in injuries to her nose, face, and knee, with a brief loss of consciousness. The injuries are healing, but she is concerned about her ability to get up from low positions due to weakness in her knees. Her knees feel weak but are not painful, and they do not give out when walking.  She has a history of neuropathy, primarily affecting the bottom of her feet and two fingers on her hand. She describes herself as 'post diabetic' and believes this affects her nerve endings. She continues to take gabapentin  at bedtime for neuropathy.  Her sense of taste and smell have been absent since undergoing chemotherapy, which she completed in September 2025. She is frustrated with this loss, as she enjoys cooking but cannot taste her food. She hopes  to regain these senses over time.  Her blood pressure at home is typically around 145-150 mmHg systolic, but it rises to 170 mmHg when visiting the oncologist. She is currently taking amlodipine  10 mg for hypertension. She also takes Miralax , tamoxifen , a vitamin D and iron supplement, and Tylenol  as needed. She no longer uses Voltaren  gel.  She experiences lymphedema in her left hand, which she manages by wrapping it at night. She drinks about four to five glasses of water daily and notes that her skin tends to be dry. She has a history of anemia, with recent lab work showing a hemoglobin level of 11.8 g/dL, improved from 88.8 g/dL. She takes an iron supplement to help with this.  No issues with back pain, bowel movements occur almost daily, and she urinates two to three times at night without difficulty. She has no history of emphysema and smoked briefly in college. She drives herself and manages her daily activities independently.   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   Diabetes mellitus    fasting 90-100   HTN (hypertension) 03/25/2013   Hx of radiation therapy    breasts hx   Hypercholesterolemia    Hypertension    Lymphedema of arm    left arm   Pulmonary embolism (HCC)    Type II or unspecified type diabetes mellitus  without mention of complication, not stated as uncontrolled 03/25/2013   Past Surgical History:  Procedure Laterality Date   ABDOMINAL HYSTERECTOMY     ANKLE SURGERY Right 1995   APPENDECTOMY     BIOPSY  01/07/2018   Procedure: BIOPSY;  Surgeon: Saintclair Jasper, MD;  Location: [facility];  Service: Gastroenterology;;   BREAST SURGERY Bilateral    mastectomy   COLONOSCOPY WITH PROPOFOL  N/A 01/07/2018   Procedure: COLONOSCOPY WITH PROPOFOL ;  Surgeon: Saintclair Jasper, MD;  Location: [facility];  Service: Gastroenterology;   Laterality: N/A;   ESOPHAGOGASTRODUODENOSCOPY (EGD) WITH PROPOFOL  N/A 01/06/2018   Procedure: ESOPHAGOGASTRODUODENOSCOPY (EGD) WITH PROPOFOL ;  Surgeon: Saintclair Jasper, MD;  Location: [facility];  Service: Gastroenterology;  Laterality: N/A;   HERNIA REPAIR  04-26-2010   IR IMAGING GUIDED PORT INSERTION  10/29/2021   MASS EXCISION Left 01/26/2013   Procedure: EXCISION LEFT CHEST WALL MASS AND LEFT ABDOMNAL WALL MASS;  Surgeon: Vicenta DELENA Poli, MD;  Location: [facility];  Service: General;  Laterality: Left;   MASS EXCISION Left 09/20/2014   Procedure: EXCISION OF LEFT CHEST WALL MASS;  Surgeon: Vicenta Poli, MD;  Location: [facility];  Service: General;  Laterality: Left;   TEE WITHOUT CARDIOVERSION N/A 12/08/2017   Procedure: TRANSESOPHAGEAL ECHOCARDIOGRAM (TEE);  Surgeon: Pietro Redell RAMAN, MD;  Location: [facility];  Service: Cardiovascular;  Laterality: N/A;   TOTAL HIP ARTHROPLASTY Right 01/05/2017   TOTAL HIP ARTHROPLASTY Right 01/05/2017   Procedure: TOTAL HIP ARTHROPLASTY ANTERIOR APPROACH;  Surgeon: Liam Lerner, MD;  Location: [facility];  Service: Orthopedics;  Laterality: Right;    Allergies[1]  Allergies as of 04/27/2024       Reactions   Bactrim Swelling   SWELLING OF MOUTH/FACE.   Lisinopril Swelling   SWELLING OF MOUTH/FACE.   Vasotec Swelling   SWELLING OF MOUTH/FACE.   Sulfamethoxazole-trimethoprim    Other reaction(s): Unknown        Medication List        Accurate as of April 27, 2024 12:07 PM. If you have any questions, ask your nurse or doctor.          STOP taking these medications    diclofenac  Sodium 1 % Gel Commonly known as: VOLTAREN  Stopped by: Roxan Plough, NP       TAKE these medications    acetaminophen  500 MG tablet Commonly known as: TYLENOL  Take 1,000 mg by mouth as needed for moderate pain.   amLODipine  10 MG tablet Commonly known as: NORVASC  TAKE 1 TABLET BY MOUTH DAILY   Debrox 6.5 % OTIC solution Generic  drug: carbamide peroxide Place 5 drops into both ears 2 (two) times daily for 4 days. Started by: Roxan Plough, NP   ferrous sulfate  325 (65 FE) MG EC tablet Take 1 tablet by mouth every morning.   gabapentin  100 MG capsule Commonly known as: NEURONTIN  TAKE 1 CAPSULE BY MOUTH AT  BEDTIME   Neratinib  Maleate 40 MG tablet Commonly known as: NERLYNX  Take 3 tablets (120 mg total) by mouth daily. Take with food.   tamoxifen  20 MG tablet Commonly known as: NOLVADEX  Take 1 tablet (20 mg total) by mouth daily.   VITAMIN D PO Take 1 tablet by mouth daily.        Review of Systems  Constitutional:  Negative for appetite change, chills, fatigue, fever and unexpected weight change.  HENT:  Negative for congestion, dental problem, ear discharge, ear pain, facial swelling, hearing loss, nosebleeds, postnasal drip, rhinorrhea, sinus pressure, sinus pain,  sneezing, sore throat, tinnitus and trouble swallowing.        Loss of sense of smell test due to chemotherapy  Eyes:  Negative for pain, discharge, redness, itching and visual disturbance.  Respiratory:  Negative for cough, chest tightness, shortness of breath and wheezing.   Cardiovascular:  Negative for chest pain, palpitations and leg swelling.  Gastrointestinal:  Negative for abdominal distention, abdominal pain, blood in stool, constipation, diarrhea, nausea and vomiting.  Endocrine: Negative for cold intolerance, heat intolerance, polydipsia, polyphagia and polyuria.  Genitourinary:  Negative for difficulty urinating, dysuria, flank pain, frequency and urgency.  Musculoskeletal:  Positive for arthralgias and gait problem. Negative for back pain, joint swelling, myalgias, neck pain and neck stiffness.  Skin:  Negative for color change, pallor, rash and wound.  Neurological:  Positive for numbness. Negative for dizziness, syncope, speech difficulty, weakness, light-headedness and headaches.       Numbness and tingling on left fingers  and feet   Hematological:  Does not bruise/bleed easily.       Left hand lymphedema   Psychiatric/Behavioral:  Negative for agitation, behavioral problems, confusion, hallucinations and sleep disturbance. The patient is not nervous/anxious.     Immunization History  Administered Date(s) Administered   Fluad Quad(high Dose 65+) 02/20/2020, 01/13/2024   Fluad Trivalent(High Dose 65+) 01/07/2023   INFLUENZA, HIGH DOSE SEASONAL PF 01/08/2017, 01/12/2019   Influenza, Quadrivalent, Recombinant, Inj, Pf 01/12/2019   PFIZER(Purple Top)SARS-COV-2 Vaccination 05/20/2019, 06/10/2019, 02/04/2020   PNEUMOCOCCAL CONJUGATE-20 01/07/2023   Pertinent  Health Maintenance Due  Topic Date Due   OPHTHALMOLOGY EXAM  Never done   HEMOGLOBIN A1C  07/05/2024   FOOT EXAM  10/26/2024   Influenza Vaccine  Completed   Bone Density Scan  Completed   Mammogram  Discontinued      04/03/2022    1:16 PM 04/03/2022    4:00 PM 01/26/2024   10:14 AM 01/26/2024   10:33 AM 04/27/2024   10:42 AM  Fall Risk  Falls in the past year?   1 1 1   Was there an injury with Fall?   0  1  0  Fall Risk Category Calculator   1 2 1   (RETIRED) Patient Fall Risk Level High fall risk  High fall risk      Patient at Risk for Falls Due to   No Fall Risks  History of fall(s)  Fall risk Follow up   Falls evaluation completed  Falls evaluation completed     Data saved with a previous flowsheet row definition   Functional Status Survey:    Vitals:   04/27/24 1044  BP: (!) 142/72  Pulse: 95  Temp: (!) 97.3 F (36.3 C)  SpO2: 98%  Weight: 179 lb 3.2 oz (81.3 kg)  Height: 5' 7 (1.702 m)   Body mass index is 28.07 kg/m. Physical Exam   VITALS: T- 97.8, P- 95, BP- 144/72, SaO2- 98% GENERAL: Alert, cooperative, well developed, no acute distress. HEENT: Normocephalic, normal oropharynx, moist mucous membranes. Cerumen impaction in both ears. No sinus tenderness. Swelling and bruising on forehead. CHEST: Clear to auscultation  bilaterally. No wheezes, rhonchi, or crackles. CARDIOVASCULAR: Regular rate and rhythm, S1 and S2 normal without murmurs. ABDOMEN: Soft, non-tender, non-distended, without organomegaly. Normal bowel sounds. EXTREMITIES: No cyanosis or edema. Bruising and swelling on left knee. NEUROLOGICAL: Cranial nerves grossly intact, moves all extremities without gross motor or sensory deficit. SKIN: Skin dry, especially on legs. No rash,no lesion or erythema   PSYCHIATRY/BEHAVIORAL: Mood stable  Labs reviewed: Recent Labs    02/11/24 1029 03/09/24 1349 04/15/24 1315  NA 139 141 138  K 3.7 4.1 3.9  CL 105 107 103  CO2 27 25 24   GLUCOSE 181* 143* 117*  BUN 16 18 17   CREATININE 0.65 0.82 0.68  CALCIUM 8.6* 8.8* 9.0  MG 1.7 2.0 1.8   Recent Labs    02/11/24 1029 03/09/24 1349 04/15/24 1315  AST 12* 17 19  ALT 7 8 9   ALKPHOS 66 73 66  BILITOT 0.5 0.3 0.4  PROT 6.7 6.8 7.2  ALBUMIN 3.8 4.0 4.2   Recent Labs    02/11/24 1029 03/09/24 1349 04/15/24 1315  WBC 5.3 5.8 7.6  NEUTROABS 3.4 3.7 4.8  HGB 11.6* 11.1* 11.8*  HCT 35.7* 34.6* 36.4  MCV 79.9* 79.4* 78.8*  PLT 202 190 250   No results found for: TSH Lab Results  Component Value Date   HGBA1C 5.8 (H) 01/06/2024   No results found for: CHOL, HDL, LDLCALC, LDLDIRECT, TRIG, CHOLHDL  Significant Diagnostic Results in last 30 days:  No results found.  Assessment/Plan  Weakness of both lower extremities and repeated falls Weakness in both lower extremities, particularly in the knees, leading to difficulty in rising from seated positions. No pain reported, but weakness is present. Recent fall attributed to inappropriate footwear rather than knee weakness. Neuropathy may contribute to balance issues. - Referred to physical therapy for evaluation and management of lower extremity weakness and balance issues.  Polyneuropathy Affecting the feet and two fingers, likely exacerbated by previous chemotherapy. No  current pain reported, but neuropathy contributes to balance issues and falls. - Continue gabapentin  for neuropathy management.  Essential hypertension Blood pressure generally well-controlled at home with readings around 145-150/72 mmHg. Elevated readings noted in clinical settings, likely due to anxiety. - Continue amlodipine  10 mg daily for blood pressure management.  Polyosteoarthritis No current pain reported in the knees, but weakness is present. Previous use of Voltaren  gel discontinued. - Continue Tylenol  as needed for pain management.  Bilateral impacted cerumen Cerumen impaction in both ears, with the right ear completely covered. No pain reported, but potential for discomfort if not addressed. - Use Debrox drops, 5 drops in each ear twice daily for 4 days to soften cerumen. - Scheduled follow-up appointment in one week for ear irrigation.  Breast cancer, status post chemotherapy, on tamoxifen  Status post chemotherapy completed in September 2025. Currently on tamoxifen  with monthly oncologist follow-ups. - Continue tamoxifen  as prescribed. - Continue monthly follow-ups with oncologist.  Anemia Hemoglobin improved to 11.8 g/dL from previous 88.8 g/dL. No signs of gastrointestinal bleeding reported. - Continue iron supplementation. - Encouraged dietary intake of iron-rich foods such as beets.  Lymphedema of left hand Chronic lymphedema in the left hand, managed with wrapping at night. - Continue current management with wrapping at night.  Loss of taste and smell post-chemotherapy Loss of taste and smell since chemotherapy in September 2025. Some ability to taste strong flavors like garlic and cayenne, but not cinnamon or nutmeg. No current treatment available, but potential for spontaneous recovery. - Continue to monitor for potential spontaneous recovery of taste and smell.   Family/ staff Communication: Reviewed plan of care with patient verbalized  understanding  Labs/tests ordered: Labs done by oncologist  Next Appointment : Return in about 6 months (around 10/25/2024) for medical mangement of chronic issues., Bilateral ear lavage in one week.   Spent 30 minutes of Face to face and non-face to face with patient  >  50% time spent counseling; reviewing medical record; tests; labs; documentation and developing future plan of care.   Roxan BROCKS Rhylei Mcquaig, NP      [1]  Allergies Allergen Reactions   Bactrim Swelling    SWELLING OF MOUTH/FACE.   Lisinopril Swelling    SWELLING OF MOUTH/FACE.   Vasotec Swelling    SWELLING OF MOUTH/FACE.   Sulfamethoxazole-Trimethoprim     Other reaction(s): Unknown   "

## 2024-04-27 NOTE — Patient Instructions (Addendum)
 Due for covid, shingles, and Tetanus vaccines can obtain at local pharmacy.  - Instill debrox 6.5 otic solution 5 drops into each ear twice daily x 4 days then follow up for ear lavage.May apply cotton ball at bedtime to prevent drainage to pillow.

## 2024-04-29 ENCOUNTER — Telehealth: Payer: Self-pay

## 2024-04-29 ENCOUNTER — Other Ambulatory Visit (HOSPITAL_COMMUNITY): Payer: Self-pay

## 2024-04-29 ENCOUNTER — Ambulatory Visit (HOSPITAL_COMMUNITY)
Admission: RE | Admit: 2024-04-29 | Discharge: 2024-04-29 | Disposition: A | Discharge status: Home / Self Care | Source: Ambulatory Visit | Attending: Hematology and Oncology | Admitting: Hematology and Oncology

## 2024-04-29 ENCOUNTER — Encounter: Payer: Self-pay | Admitting: Hematology and Oncology

## 2024-04-29 DIAGNOSIS — C50312 Malignant neoplasm of lower-inner quadrant of left female breast: Secondary | ICD-10-CM | POA: Diagnosis present

## 2024-04-29 DIAGNOSIS — Z170 Estrogen receptor positive status [ER+]: Secondary | ICD-10-CM | POA: Insufficient documentation

## 2024-04-29 MED ORDER — HEPARIN SOD (PORK) LOCK FLUSH 100 UNIT/ML IV SOLN
INTRAVENOUS | Status: AC
  Filled 2024-04-29: qty 5

## 2024-04-29 MED ORDER — HEPARIN SOD (PORK) LOCK FLUSH 100 UNIT/ML IV SOLN
500.0000 [IU] | Freq: Once | INTRAVENOUS | Status: AC
  Administered 2024-04-29: 500 [IU] via INTRAVENOUS

## 2024-04-29 MED ORDER — IOHEXOL 300 MG/ML  SOLN
100.0000 mL | Freq: Once | INTRAMUSCULAR | Status: AC | PRN
  Administered 2024-04-29: 100 mL via INTRAVENOUS

## 2024-04-29 NOTE — Telephone Encounter (Signed)
 Oral Oncology Patient Advocate Encounter   Received notification that prior authorization for Nerlynx  is due for renewal.   PA submitted on 04-29-24 Key [id number] Status is pending    *Fax copy of PA approval or Appeal to PAP. [facility] Patient [place] P# [phone]  Charlott Hamilton,  CPhT-Adv  she/her/hers [facility]  [facility] Pharmacy Technician Patient Advocate Specialist III [facility] Phone: [phone]  Fax: [phone] [email or web address]

## 2024-05-04 ENCOUNTER — Ambulatory Visit: Admitting: Family

## 2024-05-05 ENCOUNTER — Other Ambulatory Visit (HOSPITAL_COMMUNITY): Payer: Self-pay

## 2024-05-05 ENCOUNTER — Other Ambulatory Visit: Payer: Self-pay | Admitting: *Deleted

## 2024-05-05 MED ORDER — NERATINIB MALEATE 40 MG PO TABS: 120.0000 mg | ORAL_TABLET | Freq: Every day | ORAL | 0 refills | Status: DC

## 2024-05-05 NOTE — Telephone Encounter (Signed)
 Oral Oncology Patient Advocate Encounter  Prior Authorization for Nerlynx  has been approved.    PA# [id number] Effective dates: 04-29-24 through 04-13-25  Patients co-pay is $2,044.31.  Copay information and approval letter have been faxed to [employer] to continue re enrollment application.     Charlott Hamilton,  CPhT-Adv  she/her/hers [facility]  [facility] Services Pharmacy Technician Patient Advocate Specialist III [facility] Phone: [phone]  Fax: [phone] [email or web address]

## 2024-05-10 ENCOUNTER — Telehealth: Payer: Self-pay | Admitting: Physical Therapy

## 2024-05-10 ENCOUNTER — Ambulatory Visit: Admitting: Physical Therapy

## 2024-05-10 ENCOUNTER — Other Ambulatory Visit (HOSPITAL_COMMUNITY): Payer: Self-pay

## 2024-05-10 NOTE — Telephone Encounter (Signed)
 Did not come for evaluation appt (most likely due to icy conditions).  Called to reschedule but no answer to home phone number.

## 2024-05-10 NOTE — Progress Notes (Incomplete)
 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   Diabetes mellitus    fasting 90-100   HTN (hypertension) 03/25/2013   Hx of radiation therapy    breasts hx   Hypercholesterolemia    Hypertension    Lymphedema of arm    left arm   Pulmonary embolism (HCC)    Type II or unspecified type diabetes mellitus without mention of complication, not stated as uncontrolled 03/25/2013   Past Surgical History:  Procedure Laterality Date   ABDOMINAL HYSTERECTOMY     ANKLE SURGERY Right 1995   APPENDECTOMY     BIOPSY  01/07/2018   Procedure: BIOPSY;  Surgeon: Saintclair Jasper, MD;  Location: [facility];  Service: Gastroenterology;;   BREAST SURGERY Bilateral    mastectomy   COLONOSCOPY WITH PROPOFOL  N/A 01/07/2018   Procedure: COLONOSCOPY WITH PROPOFOL ;  Surgeon: Saintclair Jasper, MD;  Location: [facility];  Service: Gastroenterology;  Laterality: N/A;   ESOPHAGOGASTRODUODENOSCOPY (EGD) WITH PROPOFOL  N/A 01/06/2018   Procedure: ESOPHAGOGASTRODUODENOSCOPY (EGD) WITH PROPOFOL ;  Surgeon: Saintclair Jasper, MD;  Location: [facility];  Service: Gastroenterology;  Laterality: N/A;   HERNIA REPAIR  04-26-2010   IR IMAGING GUIDED PORT INSERTION  10/29/2021   MASS EXCISION Left 01/26/2013   Procedure: EXCISION LEFT CHEST WALL MASS AND LEFT ABDOMNAL WALL MASS;  Surgeon: Vicenta DELENA Poli, MD;  Location: [facility];  Service: General;  Laterality: Left;   MASS EXCISION Left 09/20/2014   Procedure: EXCISION OF LEFT CHEST WALL MASS;  Surgeon: Vicenta Poli, MD;  Location: [facility];  Service: General;  Laterality: Left;   TEE WITHOUT  CARDIOVERSION N/A 12/08/2017   Procedure: TRANSESOPHAGEAL ECHOCARDIOGRAM (TEE);  Surgeon: Pietro Redell RAMAN, MD;  Location: [facility] [place] [facility];  Service: Cardiovascular;  Laterality: N/A;   TOTAL HIP ARTHROPLASTY Right 01/05/2017   TOTAL HIP ARTHROPLASTY Right 01/05/2017   Procedure: TOTAL HIP ARTHROPLASTY ANTERIOR APPROACH;  Surgeon: Liam Lerner, MD;  Location: [facility];  Service: Orthopedics;  Laterality: Right;   Patient Active Problem List   Diagnosis Date Noted   Diabetic peripheral neuropathy associated with type 2 diabetes mellitus (HCC) 04/27/2024   Acute lower GI bleeding 01/07/2023   Rectal bleeding 01/05/2023   Acute GI bleeding 10/04/2022   Port-A-Cath in place 06/12/2022   Loss of perception for taste 03/17/2022   Primary osteoarthritis involving multiple joints 03/17/2022   Genetic testing 07/24/2021   Microcytosis 04/29/2021   Hypokalemia 04/28/2021   Pulmonary embolism (HCC)    Agnosia 08/09/2020   Hardening of the aorta (main artery of the heart) 08/09/2020   Morbid obesity (HCC) 08/09/2020   Neuropathy 08/09/2020   Primary osteoarthritis 08/09/2020   Sensorineural hearing loss (SNHL) of both ears 08/07/2020   Anosmia 06/05/2020   Tinnitus, bilateral 06/05/2020   GI bleed 07/13/2019   ABLA (acute blood loss anemia) 07/12/2019   Microcytic anemia 01/05/2018   Melena 01/04/2018   Lower GI bleed 01/04/2018   Diabetes mellitus type II, controlled, with no complications (HCC) 12/05/2017   Tenosynovitis of left wrist 12/02/2017   Infection of left wrist (HCC) 11/30/2017   Primary osteoarthritis of right  hip 01/05/2017   Osteoarthritis of right hip 01/03/2017   Lymphedema of upper extremity 01/17/2014   Osteopenia 01/17/2014   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   Dilated pore of Winer 08/02/2013   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 08/02/2013   Scar 08/02/2013   Malignant neoplasm of lower-inner quadrant of left breast in female,  estrogen receptor positive (HCC) 06/16/2013   Type II or unspecified type diabetes mellitus without mention of complication, not stated as uncontrolled 03/25/2013   Essential hypertension 03/25/2013   Chest wall recurrence of breast cancer (HCC) 02/21/2013   Abdominal wall mass 01/14/2013    ONSET DATE: ***  REFERRING DIAG: R29.898 weakness of both lower extremities;R29.6 falling episodes  THERAPY DIAG:   Rationale for Evaluation and Treatment: Rehabilitation  SUBJECTIVE:  SUBJECTIVE STATEMENT: Pt reports having inability to walk up and down stairs with both legs alternating. She would like to be able to navigate her 16 steps at home independently, and to increase bilateral LE strength.    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 Pts husband has wrapped before and she has a night garment  PAIN:  Are you having pain? Yes: Pain location: bil shoulders Pain description: ache, didn't rate the pain Aggravating factors: lifting arms to side or forward  Relieving factors: not moving it   PRECAUTIONS: None   RED FLAGS: None      WEIGHT BEARING RESTRICTIONS: No   FALLS:  Has patient fallen in last 6 months? Yes. 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     PATIENT SURVEYS:  ABC scale:   COGNITION: Overall cognitive status: Within functional limits for tasks assessed                         SENSATION: Not tested Neuropathy in bilateral feet at night , why pt takes gabapentin       MUSCLE LENGTH: Hamstrings: Right side tightness; Left side tightness     POSTURE: rounded shoulders, forward head, and flexed trunk    PALPATION: NA   UPPER EXTREMITY ROM: Shoulder strength: 4/5 shoulder flexion and extension, abduction 4-/5, IR/ER 3+/5 tested in neutral Shoulder mobility: limited 60% in bilateral shoul flexion/Abd/ER/IR with pain and weakness reported    LOWER EXTREMITY ROM:  LOWER EXTREMITY MMT:   MMT Right eval Left eval  Hip flexion 4- 3+  Hip extension 4- 3+  Hip abduction 4- 3+  Hip adduction      Hip internal rotation 4- 4-  Hip external rotation 4- 4-  Knee flexion 4 3+  Knee extension 4- 3+  Ankle dorsiflexion      Ankle plantarflexion      Ankle inversion      Ankle eversion       (Blank rows = not tested)   LOWER EXTREMITY SPECIAL TESTS:      FUNCTIONAL TESTS:  12/01/23 times sit to stand: 15.27 sec used UE support  Timed up and go (TUG): 19.27 no cane; 19.00 sec with cane  3 minute walk test:    GAIT: Distance walked: 300 ft       Assistive device utilized: Single point cane Level of assistance: Min A, genu varus and antalgic gait. Comments: Pt walked from wait room across ortho gym  to private room and back                                                                                                                                  TREATMENT DATE:   05/10/2024 evaluation Findings from evaluation discussed, pt educated on plan of care, HEP initiated.   Heel raises/toe raises in seated x10 Hip ER with red band x10 Quad set x10 in seated with leg extended TUG Sit to stands     PATIENT EDUCATION:  Education details: [email or web address] Person educated: Patient Education method: Programmer, Multimedia, Demonstration, Actor cues, Verbal cues, and Handouts Education comprehension: verbalized understanding, returned demonstration, and verbal cues required   HOME EXERCISE PROGRAM: Access Code: [email or web address] URL: [email or web address] Date: 12/01/2023 Prepared by: Burnard   Exercises - Seated Hip Abduction with Resistance  - 2-3 x daily - 7 x weekly - 1-2 sets - 10 reps - Seated Quad Set  - 2-3 x daily - 7 x weekly - 1-2 sets - 5 reps - 5 hold - Seated Heel Toe Raises  - 3 x daily - 7 x weekly - 2 sets - 10 reps   ASSESSMENT:   CLINICAL IMPRESSION: Patient is a 89  y.o. female who was seen today for physical therapy evaluation and treatment for weakness of both lower extremities and falling episodes.  However upon patient request, she prefers treating her LE weakness so she can navigate stairs independently without having to do a 'step-to' pattern. Although pt demonstrates UE deficits including bilateral shoulder decreased mobility and strength, pt wants to focus on increasing functional capacity in LE. Pt states she uses a 'Grabber stick' at home whenever she needs to pick an object up from the floor, etc. Pt states she has a husband who can pick things up for her, and perform household tasks she doesn't have to complete. Pt is at falls risk based on functional tests and LEFS is 40/80.  Pt  uses a standard cane for all distances with antalgic and slow gait pattern.  Pt will benefit  from continuing skilled PT by addressing the deficits below and moving closer to her goals.    OBJECTIVE IMPAIRMENTS: Abnormal gait, cardiopulmonary status limiting activity, decreased activity tolerance, decreased balance, decreased coordination, decreased endurance, decreased knowledge of condition, decreased mobility, difficulty walking, decreased ROM, decreased strength, impaired sensation, impaired UE functional use, improper body mechanics, postural dysfunction, and pain.    ACTIVITY LIMITATIONS: carrying, lifting, bending, standing, squatting, stairs, transfers, toileting, reach over head, and caring for others   PARTICIPATION LIMITATIONS: meal prep, cleaning, laundry, and yard work   PERSONAL FACTORS: Age, Fitness, Social background, Time since onset of injury/illness/exacerbation, and 3+ comorbidities:     REHAB POTENTIAL: Good   CLINICAL DECISION MAKING: Evolving/moderate complexity   EVALUATION COMPLEXITY: Moderate     GOALS: Goals reviewed with patient? Yes   SHORT TERM GOALS: Target date: 06/07/2024   Pt will be independent with initial HEP so she can maintain PT gains and increase functional capacity.  Baseline: Goal status: INITIAL   2.  Pt will complete  3 min walk test to document her baseline.  Baseline:  Goal status: INITIAL         LONG TERM GOALS: Target date: 07/05/2024     Pt will be independent with advanced HEP so she can maintain PT gains and increase functional capacity.  Baseline:  Goal status: INITIAL   2.  Pt will perform 5 Sit to stands in < or = to 11 seconds or less without UE support so she is able to perform transfers independently.  Baseline: 15.27 sec Goal status: INITIAL   3.  Pt will score 4+/5 in all LE motions so she increases hip stability for improved functional capacity for navigating stairs.  Baseline:  Goal status: INITIAL   4.  Pt will complete Single limb stance for > or = to 10+ seconds bilaterally so she demonstrates  improved balance for car transfers, and long standing.  Baseline:  Goal status: INITIAL   5.  Pt will complete TUG in less than or equal to 14 sec so she improves balance and coordination so she can go down stairs safely.  Baseline: 19 sec  Goal status: INITIAL       PLAN:   PT FREQUENCY: 2x/week   PT DURATION: 8 weeks   PLANNED INTERVENTIONS: 97164- PT Re-evaluation, 97750- Physical Performance Testing, 97110-Therapeutic exercises, 97530- Therapeutic activity, [id number]- Neuromuscular re-education, 97535- Self Care, 02859- Manual therapy, [id number]- Gait training, [id number]- Orthotic/Prosthetic subsequent, [phone]- Canalith repositioning, [id number]- Aquatic Therapy, [phone]- Electrical stimulation (manual), [id number]- Ultrasound, [id number]- Traction (mechanical), [id number]- Ionotophoresis 4mg /ml Dexamethasone , 02981- Parrafin, Patient/Family education, Balance training, Stair training, Taping, Joint mobilization, Spinal mobilization, Manual lymph drainage, Scar mobilization, Compression bandaging, Vestibular training, DME instructions, Wheelchair mobility training, Cryotherapy, and Moist heat   PLAN FOR NEXT SESSION: LE strengthening, flexibility, assess/update HEP, balance tasks     "

## 2024-05-11 ENCOUNTER — Encounter: Payer: Self-pay | Admitting: Hematology and Oncology

## 2024-05-11 ENCOUNTER — Other Ambulatory Visit: Payer: Self-pay

## 2024-05-11 ENCOUNTER — Telehealth: Payer: Self-pay

## 2024-05-11 ENCOUNTER — Other Ambulatory Visit: Payer: Self-pay | Admitting: Pharmacist

## 2024-05-11 ENCOUNTER — Other Ambulatory Visit (HOSPITAL_COMMUNITY): Payer: Self-pay

## 2024-05-11 MED ORDER — NERATINIB MALEATE 40 MG PO TABS
120.0000 mg | ORAL_TABLET | Freq: Every day | ORAL | 5 refills | Status: AC
  Filled 2024-05-11: qty 84, 28d supply, fill #0

## 2024-05-11 MED ORDER — NERATINIB MALEATE 40 MG PO TABS: 120.0000 mg | ORAL_TABLET | Freq: Every day | ORAL | 0 refills | Status: DC

## 2024-05-11 NOTE — Telephone Encounter (Addendum)
 Oral Oncology Patient Advocate Encounter  Was successful in securing patient a $7500 grant from [facility] to provide copayment coverage for Nerlynx .  This will keep the out of pocket expense at $0.     [employer] ID: [id number]   The billing information is as follows and has been shared with [facility].    RxBin: [id number] PCN: [id number] Member ID: [id number] Group ID: [id number] Dates of Eligibility: 04/10/2024 through 04/09/2025  Fund:  Breast Cancer - Medicare Access  *[employer] has been notified Re enrolment for 2026 is no longer needed   Charlott Hamilton,  CPhT-Adv  she/her/hers [facility]  [facility] Pharmacy Technician Patient Advocate Specialist III [facility] Phone: [phone]  Fax: [phone] [email or web address]

## 2024-05-11 NOTE — Progress Notes (Signed)
 Patient transferring from [facility] to [facility]. Patient initially counseled in clinic visit note on 06/08/23.

## 2024-05-11 NOTE — Progress Notes (Signed)
 Specialty Pharmacy Initial Fill Coordination Note  Tami Schmidt is a 89 y.o. female contacted today regarding refills of specialty medication(s) Neratinib  Maleate (NERLYNX ) .  Patient requested Delivery  on 06/06/24  to verified address [address] [place] [address]   Medication will be filled on 06/03/2024.   Patient is aware of $0.00 copayment with [name] on file

## 2024-05-12 ENCOUNTER — Other Ambulatory Visit: Payer: Self-pay

## 2024-05-12 NOTE — Telephone Encounter (Signed)
 Oral Oncology Patient Advocate Encounter  *05-11-24 [name] for Nerlynx  obtained to fill at [facility] now, PAP no longer needed [employer] contacted     Charlott Hamilton,  CPhT-Adv  she/her/hers [facility]  [facility] Pharmacy Technician Patient Advocate Specialist III [facility] Phone: [phone]  Fax: [phone] [email or web address]

## 2024-05-18 ENCOUNTER — Telehealth: Payer: Self-pay | Admitting: Hematology and Oncology

## 2024-05-18 NOTE — Telephone Encounter (Signed)
 I spoke with patient as she called in to reschedule 05/19/2024 appointment to 06/07/2024 due to inclement weather.

## 2024-05-19 ENCOUNTER — Inpatient Hospital Stay: Admitting: Hematology and Oncology

## 2024-05-19 ENCOUNTER — Inpatient Hospital Stay

## 2024-05-31 ENCOUNTER — Ambulatory Visit: Admitting: Podiatry

## 2024-06-07 ENCOUNTER — Inpatient Hospital Stay: Admitting: Hematology and Oncology

## 2024-06-07 ENCOUNTER — Inpatient Hospital Stay: Attending: Adult Health

## 2024-10-25 ENCOUNTER — Ambulatory Visit: Admitting: Family
